# Patient Record
Sex: Female | Born: 1945 | Race: White | Hispanic: No | Marital: Married | Smoking: Former smoker
Health system: Southern US, Community
[De-identification: ages and names within clinical notes are randomized; demographics above are authoritative.]

## PROBLEM LIST (undated history)

## (undated) DIAGNOSIS — I10 Essential (primary) hypertension: Secondary | ICD-10-CM

## (undated) DIAGNOSIS — E119 Type 2 diabetes mellitus without complications: Secondary | ICD-10-CM

## (undated) DIAGNOSIS — G609 Hereditary and idiopathic neuropathy, unspecified: Secondary | ICD-10-CM

## (undated) DIAGNOSIS — F329 Major depressive disorder, single episode, unspecified: Secondary | ICD-10-CM

## (undated) DIAGNOSIS — M549 Dorsalgia, unspecified: Secondary | ICD-10-CM

## (undated) DIAGNOSIS — M899 Disorder of bone, unspecified: Secondary | ICD-10-CM

## (undated) DIAGNOSIS — F3289 Other specified depressive episodes: Secondary | ICD-10-CM

## (undated) DIAGNOSIS — Z9049 Acquired absence of other specified parts of digestive tract: Secondary | ICD-10-CM

## (undated) DIAGNOSIS — Z87891 Personal history of nicotine dependence: Secondary | ICD-10-CM

## (undated) DIAGNOSIS — E669 Obesity, unspecified: Secondary | ICD-10-CM

## (undated) DIAGNOSIS — M949 Disorder of cartilage, unspecified: Secondary | ICD-10-CM

## (undated) DIAGNOSIS — IMO0001 Reserved for inherently not codable concepts without codable children: Secondary | ICD-10-CM

## (undated) DIAGNOSIS — Z905 Acquired absence of kidney: Secondary | ICD-10-CM

## (undated) DIAGNOSIS — Z8489 Family history of other specified conditions: Secondary | ICD-10-CM

## (undated) DIAGNOSIS — N19 Unspecified kidney failure: Secondary | ICD-10-CM

## (undated) DIAGNOSIS — IMO0002 Reserved for concepts with insufficient information to code with codable children: Secondary | ICD-10-CM

## (undated) DIAGNOSIS — M722 Plantar fascial fibromatosis: Secondary | ICD-10-CM

## (undated) DIAGNOSIS — J449 Chronic obstructive pulmonary disease, unspecified: Secondary | ICD-10-CM

## (undated) DIAGNOSIS — Z9889 Other specified postprocedural states: Secondary | ICD-10-CM

## (undated) HISTORY — PX: CARPAL TUNNEL RELEASE: SHX101

## (undated) HISTORY — PX: FOOT SURGERY: SHX648

## (undated) HISTORY — DX: Major depressive disorder, single episode, unspecified: F32.9

## (undated) HISTORY — DX: Disorder of bone, unspecified: M89.9

## (undated) HISTORY — PX: ABDOMINAL HYSTERECTOMY: SHX81

## (undated) HISTORY — PX: NECK SURGERY: SHX720

## (undated) HISTORY — DX: Obesity, unspecified: E66.9

## (undated) HISTORY — PX: LUMBAR FUSION: SHX111

## (undated) HISTORY — PX: HIP FRACTURE SURGERY: SHX118

## (undated) HISTORY — PX: APPENDECTOMY: SHX54

## (undated) HISTORY — DX: Reserved for inherently not codable concepts without codable children: IMO0001

## (undated) HISTORY — DX: Other specified depressive episodes: F32.89

## (undated) HISTORY — DX: Unspecified kidney failure: N19

## (undated) HISTORY — DX: Essential (primary) hypertension: I10

## (undated) HISTORY — PX: VESICOVAGINAL FISTULA CLOSURE W/ TAH: SUR271

## (undated) HISTORY — DX: Acquired absence of other specified parts of digestive tract: Z90.49

## (undated) HISTORY — PX: BACK SURGERY: SHX140

## (undated) HISTORY — DX: Dorsalgia, unspecified: M54.9

## (undated) HISTORY — DX: Plantar fascial fibromatosis: M72.2

## (undated) HISTORY — DX: Acquired absence of kidney: Z90.5

## (undated) HISTORY — PX: CARDIAC CATHETERIZATION: SHX172

## (undated) HISTORY — DX: Other specified postprocedural states: Z98.890

## (undated) HISTORY — DX: Hereditary and idiopathic neuropathy, unspecified: G60.9

## (undated) HISTORY — PX: CHOLECYSTECTOMY: SHX55

## (undated) HISTORY — DX: Personal history of nicotine dependence: Z87.891

## (undated) HISTORY — DX: Type 2 diabetes mellitus without complications: E11.9

## (undated) HISTORY — PX: OTHER SURGICAL HISTORY: SHX169

## (undated) HISTORY — DX: Disorder of cartilage, unspecified: M94.9

## (undated) HISTORY — PX: CERVICAL DISCECTOMY: SHX98

## (undated) HISTORY — DX: Reserved for concepts with insufficient information to code with codable children: IMO0002

## (undated) HISTORY — PX: JOINT REPLACEMENT: SHX530

## (undated) HISTORY — PX: ELBOW SURGERY: SHX618

## (undated) SURGERY — VIDEO BRONCHOSCOPY WITHOUT FLUORO
Anesthesia: Moderate Sedation

---

## 1989-11-15 HISTORY — PX: KIDNEY DONATION: SHX685

## 1999-09-16 HISTORY — PX: COLONOSCOPY: SHX174

## 2002-04-24 ENCOUNTER — Ambulatory Visit (HOSPITAL_COMMUNITY): Admission: RE | Admit: 2002-04-24 | Discharge: 2002-04-24 | Payer: Self-pay | Admitting: Cardiology

## 2003-01-16 ENCOUNTER — Encounter: Payer: Self-pay | Admitting: Neurosurgery

## 2003-01-18 ENCOUNTER — Encounter: Payer: Self-pay | Admitting: Neurosurgery

## 2003-01-18 ENCOUNTER — Inpatient Hospital Stay (HOSPITAL_COMMUNITY): Admission: RE | Admit: 2003-01-18 | Discharge: 2003-01-22 | Payer: Self-pay | Admitting: Neurosurgery

## 2003-07-31 ENCOUNTER — Encounter: Payer: Self-pay | Admitting: Family Medicine

## 2003-07-31 ENCOUNTER — Encounter: Admission: RE | Admit: 2003-07-31 | Discharge: 2003-07-31 | Payer: Self-pay | Admitting: Family Medicine

## 2003-09-13 ENCOUNTER — Other Ambulatory Visit: Admission: RE | Admit: 2003-09-13 | Discharge: 2003-09-13 | Payer: Self-pay | Admitting: Family Medicine

## 2004-09-15 ENCOUNTER — Ambulatory Visit: Payer: Self-pay | Admitting: Licensed Clinical Social Worker

## 2004-09-30 ENCOUNTER — Ambulatory Visit: Payer: Self-pay | Admitting: Licensed Clinical Social Worker

## 2004-09-30 ENCOUNTER — Ambulatory Visit: Payer: Self-pay | Admitting: Family Medicine

## 2004-10-05 ENCOUNTER — Ambulatory Visit: Payer: Self-pay | Admitting: Family Medicine

## 2004-10-15 HISTORY — PX: REPLACEMENT TOTAL KNEE: SUR1224

## 2004-10-20 ENCOUNTER — Ambulatory Visit: Payer: Self-pay | Admitting: Family Medicine

## 2004-10-27 ENCOUNTER — Ambulatory Visit: Payer: Self-pay | Admitting: Family Medicine

## 2004-11-10 ENCOUNTER — Inpatient Hospital Stay (HOSPITAL_COMMUNITY): Admission: RE | Admit: 2004-11-10 | Discharge: 2004-11-13 | Payer: Self-pay | Admitting: Orthopedic Surgery

## 2004-12-02 ENCOUNTER — Ambulatory Visit: Payer: Self-pay | Admitting: Family Medicine

## 2004-12-17 ENCOUNTER — Encounter: Admission: RE | Admit: 2004-12-17 | Discharge: 2004-12-17 | Payer: Self-pay | Admitting: Neurosurgery

## 2004-12-25 ENCOUNTER — Encounter: Admission: RE | Admit: 2004-12-25 | Discharge: 2004-12-25 | Payer: Self-pay | Admitting: Neurosurgery

## 2005-01-29 ENCOUNTER — Ambulatory Visit: Payer: Self-pay | Admitting: Family Medicine

## 2005-03-04 ENCOUNTER — Ambulatory Visit: Payer: Self-pay | Admitting: Family Medicine

## 2005-05-05 ENCOUNTER — Ambulatory Visit: Payer: Self-pay | Admitting: Family Medicine

## 2005-05-13 ENCOUNTER — Encounter: Admission: RE | Admit: 2005-05-13 | Discharge: 2005-05-13 | Payer: Self-pay | Admitting: Neurosurgery

## 2005-07-13 ENCOUNTER — Inpatient Hospital Stay (HOSPITAL_COMMUNITY): Admission: RE | Admit: 2005-07-13 | Discharge: 2005-07-15 | Payer: Self-pay | Admitting: Neurosurgery

## 2005-11-02 ENCOUNTER — Ambulatory Visit: Payer: Self-pay | Admitting: Family Medicine

## 2005-11-23 ENCOUNTER — Ambulatory Visit: Payer: Self-pay | Admitting: Family Medicine

## 2005-11-25 ENCOUNTER — Ambulatory Visit: Payer: Self-pay | Admitting: Family Medicine

## 2005-12-09 ENCOUNTER — Ambulatory Visit: Payer: Self-pay | Admitting: Family Medicine

## 2005-12-27 ENCOUNTER — Encounter: Admission: RE | Admit: 2005-12-27 | Discharge: 2005-12-27 | Payer: Self-pay | Admitting: Orthopedic Surgery

## 2006-02-07 ENCOUNTER — Ambulatory Visit: Payer: Self-pay | Admitting: Family Medicine

## 2006-02-24 ENCOUNTER — Observation Stay (HOSPITAL_COMMUNITY): Admission: AD | Admit: 2006-02-24 | Discharge: 2006-02-25 | Payer: Self-pay | Admitting: Internal Medicine

## 2006-02-24 ENCOUNTER — Ambulatory Visit: Payer: Self-pay | Admitting: Internal Medicine

## 2006-07-13 ENCOUNTER — Ambulatory Visit: Payer: Self-pay | Admitting: Family Medicine

## 2006-08-02 ENCOUNTER — Ambulatory Visit: Payer: Self-pay | Admitting: Pulmonary Disease

## 2006-08-04 ENCOUNTER — Inpatient Hospital Stay (HOSPITAL_COMMUNITY): Admission: RE | Admit: 2006-08-04 | Discharge: 2006-08-07 | Payer: Self-pay | Admitting: Orthopedic Surgery

## 2006-11-02 ENCOUNTER — Ambulatory Visit: Payer: Self-pay | Admitting: Unknown Physician Specialty

## 2006-12-22 ENCOUNTER — Ambulatory Visit: Payer: Self-pay | Admitting: Infectious Diseases

## 2007-03-09 ENCOUNTER — Encounter: Payer: Self-pay | Admitting: Family Medicine

## 2007-03-09 DIAGNOSIS — E118 Type 2 diabetes mellitus with unspecified complications: Secondary | ICD-10-CM

## 2007-03-09 DIAGNOSIS — E119 Type 2 diabetes mellitus without complications: Secondary | ICD-10-CM | POA: Insufficient documentation

## 2007-03-09 DIAGNOSIS — M549 Dorsalgia, unspecified: Secondary | ICD-10-CM | POA: Insufficient documentation

## 2007-03-09 DIAGNOSIS — IMO0002 Reserved for concepts with insufficient information to code with codable children: Secondary | ICD-10-CM | POA: Insufficient documentation

## 2007-03-09 DIAGNOSIS — I10 Essential (primary) hypertension: Secondary | ICD-10-CM

## 2007-03-09 DIAGNOSIS — IMO0001 Reserved for inherently not codable concepts without codable children: Secondary | ICD-10-CM | POA: Insufficient documentation

## 2007-03-09 DIAGNOSIS — E1165 Type 2 diabetes mellitus with hyperglycemia: Secondary | ICD-10-CM

## 2007-03-09 DIAGNOSIS — F329 Major depressive disorder, single episode, unspecified: Secondary | ICD-10-CM | POA: Insufficient documentation

## 2007-03-09 DIAGNOSIS — G609 Hereditary and idiopathic neuropathy, unspecified: Secondary | ICD-10-CM | POA: Insufficient documentation

## 2007-03-09 DIAGNOSIS — Z87891 Personal history of nicotine dependence: Secondary | ICD-10-CM

## 2008-01-10 ENCOUNTER — Ambulatory Visit: Payer: Self-pay | Admitting: Internal Medicine

## 2008-04-16 ENCOUNTER — Ambulatory Visit: Payer: Self-pay | Admitting: General Surgery

## 2008-04-16 ENCOUNTER — Other Ambulatory Visit: Payer: Self-pay

## 2008-04-16 ENCOUNTER — Ambulatory Visit: Payer: Self-pay | Admitting: Cardiology

## 2008-04-19 ENCOUNTER — Ambulatory Visit: Payer: Self-pay | Admitting: General Surgery

## 2009-01-14 ENCOUNTER — Ambulatory Visit: Payer: Medicare Other | Admitting: Internal Medicine

## 2009-03-26 ENCOUNTER — Emergency Department (HOSPITAL_COMMUNITY): Admission: EM | Admit: 2009-03-26 | Discharge: 2009-03-26 | Payer: Self-pay | Admitting: Family Medicine

## 2009-03-28 ENCOUNTER — Encounter: Admission: RE | Admit: 2009-03-28 | Discharge: 2009-03-28 | Payer: Self-pay | Admitting: Orthopedic Surgery

## 2009-07-26 DIAGNOSIS — I2510 Atherosclerotic heart disease of native coronary artery without angina pectoris: Secondary | ICD-10-CM | POA: Insufficient documentation

## 2009-08-05 ENCOUNTER — Encounter: Payer: Self-pay | Admitting: Cardiology

## 2009-08-06 ENCOUNTER — Encounter: Payer: Self-pay | Admitting: Cardiology

## 2009-08-06 ENCOUNTER — Ambulatory Visit: Payer: Medicare Other | Admitting: Internal Medicine

## 2009-08-12 ENCOUNTER — Encounter: Payer: Self-pay | Admitting: Cardiology

## 2009-08-14 ENCOUNTER — Ambulatory Visit: Payer: Medicare Other | Admitting: Family

## 2009-08-14 ENCOUNTER — Encounter: Payer: Self-pay | Admitting: Cardiology

## 2009-08-25 ENCOUNTER — Ambulatory Visit: Payer: Self-pay | Admitting: Cardiology

## 2009-09-01 ENCOUNTER — Telehealth: Payer: Self-pay | Admitting: Cardiology

## 2009-09-02 ENCOUNTER — Telehealth (INDEPENDENT_AMBULATORY_CARE_PROVIDER_SITE_OTHER): Payer: Self-pay | Admitting: *Deleted

## 2009-09-22 ENCOUNTER — Telehealth (INDEPENDENT_AMBULATORY_CARE_PROVIDER_SITE_OTHER): Payer: Self-pay | Admitting: *Deleted

## 2009-09-23 ENCOUNTER — Encounter (HOSPITAL_COMMUNITY): Admission: RE | Admit: 2009-09-23 | Discharge: 2009-11-13 | Payer: Self-pay | Admitting: Cardiology

## 2009-09-23 ENCOUNTER — Ambulatory Visit: Payer: Self-pay | Admitting: Cardiology

## 2009-09-23 ENCOUNTER — Ambulatory Visit: Payer: Self-pay

## 2009-09-26 ENCOUNTER — Encounter: Payer: Self-pay | Admitting: Cardiology

## 2009-09-29 ENCOUNTER — Ambulatory Visit: Payer: Self-pay | Admitting: Cardiology

## 2009-09-30 ENCOUNTER — Telehealth: Payer: Self-pay | Admitting: Cardiology

## 2009-09-30 ENCOUNTER — Encounter: Payer: Self-pay | Admitting: Cardiology

## 2009-10-07 ENCOUNTER — Encounter: Payer: Self-pay | Admitting: Cardiology

## 2009-10-08 ENCOUNTER — Telehealth: Payer: Self-pay | Admitting: Cardiology

## 2009-10-14 ENCOUNTER — Telehealth: Payer: Self-pay | Admitting: Cardiology

## 2009-10-14 ENCOUNTER — Encounter: Payer: Self-pay | Admitting: Cardiology

## 2009-12-19 ENCOUNTER — Encounter: Payer: Self-pay | Admitting: Internal Medicine

## 2009-12-19 ENCOUNTER — Ambulatory Visit: Payer: Self-pay | Admitting: Cardiology

## 2010-01-15 ENCOUNTER — Ambulatory Visit: Payer: Medicare Other | Admitting: Internal Medicine

## 2010-10-05 ENCOUNTER — Ambulatory Visit: Payer: Self-pay | Admitting: Cardiology

## 2010-12-13 LAB — CONVERTED CEMR LAB
BUN: 17 mg/dL
CO2: 22 meq/L
Chloride: 97 meq/L
Creatinine, Ser: 0.98 mg/dL
GFR calc non Af Amer: 57 mL/min
Potassium: 4.6 meq/L
Sodium: 135 meq/L

## 2010-12-15 NOTE — Assessment & Plan Note (Signed)
Summary: 4-6 WK F/U   Visit Type:  4-6 weeks follow up Primary Provider:  Fara Olden, MD and Dr Lajoyce Lauber  CC:  No complains.  History of Present Illness: The patient is 65 years old and return for followup after her evaluation for chest pain and management of hypertension. She developed chest pain and we evaluated her with a Myoview scan which was negative for ischemia. Her chest pain was somewhat atypical and it has resolved.  Her other major problem was hypertension which was not well controlled at the time I saw her. We increased the Diovan but she didn't tolerate this and we cut back on the Diovan and consider beta blocker but she wanted to try other measures. Her blood pressure is been under control since that time.  Her risk profile for vascular disease includes diabetes hypertension positive cigarettes and a positive family history for coronary heart disease.  Her past history is significant for hernia kidney to her brother.  Current Medications (verified): 1)  Diovan Hct 320-25 Mg Tabs (Valsartan-Hydrochlorothiazide) .... One Tab By Mouth Once Daily 2)  Calcium Carbonate 600 Mg Tabs (Calcium Carbonate) .Marland Kitchen.. 1 By Mouth Bid 3)  Nexium 40 Mg Cpdr (Esomeprazole Magnesium) .Marland Kitchen.. 1 By Mouth Qd 4)  Aspirin 325 Mg Tabs (Aspirin) .Marland Kitchen.. 1 By Mouth Qd 5)  Daily Multiple Vitamins  Tabs (Multiple Vitamin) .Marland Kitchen.. 1 By Mouth Qd 6)  Humalog  Soln (Insulin Lispro (Human) Soln) .Marland Kitchen.. 16 U Q Am, 19u Q Pm (11.15.2010) 7)  Tylenol Arthritis Pain 650 Mg Tbcr (Acetaminophen) .... 2 By Mouth Once Daily Prn 8)  Metformin Hcl 1000 Mg Tabs (Metformin Hcl) .Marland Kitchen.. 1 By Mouth  Two Times A Day 9)  Veramyst 27.5 Mcg/spray Susp (Fluticasone Furoate) .... As Directed 10)  Proventil Hfa 108 (90 Base) Mcg/act Aers (Albuterol Sulfate) .Marland Kitchen.. 1-2 Puffs Every 4-6 Hours 11)  Advair Diskus 250-50 Mcg/dose Aepb (Fluticasone-Salmeterol) .... As Directed 12)  Humlin N .... Sliding Scale  Allergies: 1)  ! Penicillin 2)   ! Morphine 3)  ! * Anti Inflammatories 4)  ! Neurontin  Past History:  Past Medical History: Reviewed history from 08/25/2009 and no changes required. 1. FIBROMYALGIA (ICD-729.1) 2. Hx of PLANTAR FASCIITIS (ICD-728.71) 3. TOBACCO ABUSE, HX OF (ICD-V15.82): smokes 1/2 ppd.  4. DEGENERATIVE DISC DISEASE (ICD-722.6) 5.  BACK PAIN, CHRONIC (ICD-724.5): status post c-spine and l-spine fusions 6. OBESITY (ICD-278.00) 7. OSTEOPENIA (ICD-733.90) 8. 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  9. HYPERTENSION (ICD-401.9) 10. DIABETES MELLITUS, TYPE II (ICD-250.00) 11. DEPRESSION (ICD-311) 12. Status post cholecystectomy 13. GERD 14. 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 Left heart cath 7 years ago: no significant CAD  Review of Systems       ROS is negative except as outlined in HPI.   Vital Signs:  Patient profile:   65 year old female Height:      62 inches Weight:      166.50 pounds BMI:     30.56 Pulse rate:   88 / minute Pulse rhythm:   regular Resp:     18 per minute BP sitting:   134 / 74  (left arm) Cuff size:   large  Vitals Entered By: Sidney Ace (December 19, 2009 3:59 PM)  Physical Exam  Additional Exam:  Gen. Well-nourished, in no distress   Neck: No JVD, thyroid not enlarged, no carotid bruits Lungs: No tachypnea, clear without rales, rhonchi or wheezes Cardiovascular: Rhythm regular, PMI not displaced,  heart sounds  normal, no murmurs or gallops, no peripheral edema, pulses normal in all 4 extremities. Abdomen: BS normal, abdomen soft and non-tender without masses or organomegaly, no hepatosplenomegaly. MS: No deformities, no cyanosis or clubbing   Neuro:  No focal sns   Skin:  no lesions    Impression & Recommendations:  Problem # 1:  CHEST PAIN UNSPECIFIED (ICD-786.50) Her chest pain was somewhat atypical for ischemia and she has a negative Myoview scan. Her symptoms have resolved. I think there is no evidence for ischemic heart  disease although she does have a high-risk profile. We have previously counseled her on reducing her cigarette use and her blood pressure is now under good control. Her updated medication list for this problem includes:    Aspirin 325 Mg Tabs (Aspirin) .Marland Kitchen... 1 by mouth qd  Problem # 2:  HYPERTENSION (ICD-401.9) Her blood pressure is now controlled on her current dosage of Diovan. We will continue current therapy. Her updated medication list for this problem includes:    Diovan Hct 320-25 Mg Tabs (Valsartan-hydrochlorothiazide) ..... One tab by mouth once daily    Aspirin 325 Mg Tabs (Aspirin) .Marland Kitchen... 1 by mouth qd  Problem # 3:  TOBACCO ABUSE, HX OF (ICD-V15.82) With her positive family history for coronary heart disease and with her hypertension is it is especially important if she were to discontinue her cigarettes  Other Orders: EKG w/ Interpretation (93000)  Patient Instructions: 1)  We will see you back on an as needed basis.

## 2010-12-15 NOTE — Assessment & Plan Note (Signed)
Summary: rov   Visit Type:  Follow-up Primary Provider:  Fara Olden, MD and Dr Lajoyce Lauber   History of Present Illness:  Andrea Stewart is 65 years old and came in today for preventative evaluation. I had seen her in the past for evaluation of chest pain and she had a negative Myoview. She has multiple risk factors for coronary disease including hypertension and diabetes and a positive family history for coronary heart disease. She has had no recent chest pain short of breath or palpitations.  She does still smoke.  She has had a nephrectomy to donate a kidney to her brother.  Current Medications (verified): 1)  Diovan Hct 320-25 Mg Tabs (Valsartan-Hydrochlorothiazide) .... One Tab By Mouth Once Daily 2)  Calcium Carbonate 600 Mg Tabs (Calcium Carbonate) .Marland Kitchen.. 1 By Mouth Bid 3)  Prilosec 40 Mg Cpdr (Omeprazole) .... Once Daily 4)  Aspirin 325 Mg Tabs (Aspirin) .Marland Kitchen.. 1 By Mouth Qd 5)  Daily Multiple Vitamins  Tabs (Multiple Vitamin) .Marland Kitchen.. 1 By Mouth Qd 6)  Humalog  Soln (Insulin Lispro (Human) Soln) .... Uad 7)  Tylenol Arthritis Pain 650 Mg Tbcr (Acetaminophen) .... 2 By Mouth Once Daily Prn 8)  Metformin Hcl 1000 Mg Tabs (Metformin Hcl) .Marland Kitchen.. 1 By Mouth  Two Times A Day 9)  Veramyst 27.5 Mcg/spray Susp (Fluticasone Furoate) .... As Directed 10)  Proventil Hfa 108 (90 Base) Mcg/act Aers (Albuterol Sulfate) .Marland Kitchen.. 1-2 Puffs Every 4-6 Hours 11)  Advair Diskus 250-50 Mcg/dose Aepb (Fluticasone-Salmeterol) .... As Directed 12)  Humlin N .... Sliding Scale  Allergies: 1)  ! Penicillin 2)  ! Morphine 3)  ! * Anti Inflammatories 4)  ! Neurontin  Past History:  Past Medical History: Reviewed history from 08/25/2009 and no changes required. 1. FIBROMYALGIA (ICD-729.1) 2. Hx of PLANTAR FASCIITIS (ICD-728.71) 3. TOBACCO ABUSE, HX OF (ICD-V15.82): smokes 1/2 ppd.  4. DEGENERATIVE DISC DISEASE (ICD-722.6) 5.  BACK PAIN, CHRONIC (ICD-724.5): status post c-spine and l-spine fusions 6.  OBESITY (ICD-278.00) 7. OSTEOPENIA (ICD-733.90) 8. 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  9. HYPERTENSION (ICD-401.9) 10. DIABETES MELLITUS, TYPE II (ICD-250.00) 11. DEPRESSION (ICD-311) 12. Status post cholecystectomy 13. GERD 14. s/p nephrectomy (donated to brother) 15. Left heart cath 7 years ago: no significant CAD  Review of Systems       ROS is negative except as outlined in HPI.   Vital Signs:  Patient profile:   65 year old female Height:      62 inches Weight:      180 pounds BMI:     33.04 Pulse rate:   80 / minute BP sitting:   140 / 70  (left arm)  Vitals Entered By: Margaretmary Bayley CMA (October 05, 2010 4:28 PM)  Physical Exam  Additional Exam:  Gen. Well-nourished, in no distress   Neck: No JVD, thyroid not enlarged, no carotid bruits Lungs: No tachypnea, clear without rales, rhonchi or wheezes Cardiovascular: Rhythm regular, PMI not displaced,  heart sounds  normal, no murmurs or gallops, no peripheral edema, pulses normal in all 4 extremities. Abdomen: BS normal, abdomen soft and non-tender without masses or organomegaly, no hepatosplenomegaly. MS: No deformities, no cyanosis or clubbing   Neuro:  No focal sns   Skin:  no lesions    Impression & Recommendations:  Problem # 1:  HYPERTENSION (ICD-401.9) This appears fairly well controlled on her current medications. Costs are  somewhat of an issue with the Diovan and we might consider an alternative generic ARB. I'll defer  this to Dr. Derrel Nip. Her updated medication list for this problem includes:    Diovan Hct 320-25 Mg Tabs (Valsartan-hydrochlorothiazide) ..... One tab by mouth once daily    Aspirin 325 Mg Tabs (Aspirin) .Marland Kitchen... 1 by mouth qd  Problem # 2:  TOBACCO ABUSE, HX OF (ICD-V15.82) I reinforced the importance of her decreasing and discontinuing tobacco use.  Problem # 3:  DIABETES MELLITUS, TYPE II (ICD-250.00) This is managed by her primary care physician.  She indicated that  her recent cholesterol panel was good. With her diabetes we might consider a statin as secondary prevention especially if her LDL is not well below 100. Her updated medication list for this problem includes:    Diovan Hct 320-25 Mg Tabs (Valsartan-hydrochlorothiazide) ..... One tab by mouth once daily    Aspirin 325 Mg Tabs (Aspirin) .Marland Kitchen... 1 by mouth qd    Humalog Soln (Insulin lispro (human) soln) ..... Uad    Metformin Hcl 1000 Mg Tabs (Metformin hcl) .Marland Kitchen... 1 by mouth  two times a day  Other Orders: EKG w/ Interpretation (93000)  Patient Instructions: 1)  We will see you back on a as needed basis. 2)  Your physician recommends that you continue on your current medications as directed. Please refer to the Current Medication list given to you today.

## 2011-01-19 ENCOUNTER — Ambulatory Visit: Payer: Medicare Other | Admitting: Internal Medicine

## 2011-01-20 ENCOUNTER — Ambulatory Visit: Payer: Medicare Other | Admitting: Internal Medicine

## 2011-04-02 NOTE — H&P (Signed)
NAMEMarland Kitchen  MYERS, HAWKS NO.:  [id number]   MEDICAL RECORD NO.:  [id number]                   PATIENT TYPE:  INP   LOCATION:  3172                                 FACILITY:  [facility]   PHYSICIAN:  Elizabeth Sauer, M.D.                   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   HISTORY OF PRESENT ILLNESS:  This is a 65 year old right-handed white girl  whom I initially saw for neck pain last May.  She had some resolution of  that, and by the time she got to me had increasing back pain.  MRI had been  obtained that demonstrated severe spondylotic changes at L4-5 and L5-S1.  She has been through Neurontin and numerous pain maneuvers to no avail and  now presents for lumbar fusion.   PAST MEDICAL HISTORY:  Remarkable for diabetes.  She is on Silex 120 mg a  day and Actos 45 mg a day.  She has hypertension. She is on Diovan 160/12.5  mg a day, Premarin 0.125 mg a day, Nexium 20 mg a day, BuSpar 15 mg twice a  day, bupropion 75 mg twice a day.  Trental and Lantus insulin 35 units.   ALLERGIES:  PENICILLIN, MORPHINE,  and NONSTEROIDAL ANTI-INFLAMMATORIES.   PAST SURGICAL HISTORY:  1. Lumbar diskectomy in 1991.  2. Nephrectomy in 1991.  3. Appendectomy in 1958.  4. Cholecystectomy 1953.  5. Pilonidal cysts in 2000.  6. Two foot operations.  7. Carpal tunnel and trigger finger.   SOCIAL HISTORY:  She does not smoke or drink, is not working.   FAMILY HISTORY:  Mom is 55, diabetes and kidney failure.  Dad is deceased.  History is not given.   REVIEW OF SYSTEMS:  Remarkable for glasses, hypertension,  hypercholesterolemia, arm weakness, back pain, arm pain, leg pain, joint  pain, neck pain, anxiety, depression, diabetes, food allergies, and inhalant  allergies.   PHYSICAL EXAMINATION:  HEENT  Within normal limits.  NECK:  Reasonable range of motion of her neck, although she says she does  have catches in it.  CARDIAC:  Regular rate and rhythm.  CHEST: Clear.  ABDOMEN:  Nontender.  No hepatosplenomegaly.  EXTREMITIES:  Without clubbing or cyanosis.  GU:  Exam deferred.  PULSES:  Peripheral pulses are good.  NEUROLOGIC:  Awake, alert, and oriented.  Cranial nerves are intact.  Motor  exam has full strength in the upper extremities.  Lower extremity strength  is full until you get her up and ask her to walk.  Then she has weakness of  the dorsiflexors at 4/5 after about 5 minutes.  Reflexes 1 throughout the  upper extremities.  The lower extremities are areflexic.  She does have a  sensory deficit which  is stocking glove fashion strongly suggestive of some  component of peripheral neuropathy.   LABORATORY DATA:  The MRI of her lumbar spine demonstrated bone-on-bone at  L4-5 and L5-S1 with severe facet arthropathy.   PLAN:  Lumbar laminectomy, diskectomy, posterior lumbar interbody fusion at  L4-5 and L5-S1. The risks and benefits of this approach have been discussed  with her, and she wishes to proceed.                                               Elizabeth Sauer, M.D.    MWR/MEDQ  D:  01/18/2003  T:  01/18/2003  Job:  [id number]

## 2011-04-02 NOTE — Discharge Summary (Signed)
   NAMEASCHLEY, SALAY NO.:  [id number]   MEDICAL RECORD NO.:  [id number]                   PATIENT TYPE:  INP   LOCATION:  3009                                 FACILITY:  [facility]   PHYSICIAN:  Elizabeth Sauer, M.D.                   DATE OF BIRTH:  Jun 01, 1946   DATE OF ADMISSION:  01/18/2003  DATE OF DISCHARGE:  01/22/2003                                 DISCHARGE SUMMARY   ADMITTING DIAGNOSIS:  Spondylosis L4-5, L5-S1.   DISCHARGE DIAGNOSES:  Spondylosis L4-5. L5-S1.   OPERATIVE PROCEDURE:  L4-5, L5-S1 laminectomy, diskectomy, posterior lumbar  interbody fusion with a Ray threaded fusion cage, and posterolateral bone  grafting.   SERVICE:  Neurosurgery.   COMPLICATIONS:  None.   DISCHARGE STATUS:  Responding well.   HISTORY:  A 65 year old right-handed white lady whose history and physical  is recanted in the chart.  She has had back pain and had severe spondylitic  disease at 4-5-1, failed epidural steroids, and admitted for fusion.  Her  medical history is remarkable for diabetes.   MEDICATIONS:  She is on Cylex, Actos, Diovan, Premarin, Nexium, BuSpar,  bupropion, Trental, and Lantus insulin 35 units twice a day.   PHYSICAL EXAMINATION:  General exam is intact.  Neurologic exam was intact.   HOSPITAL COURSE:  She was admitted after ascertaining the normal laboratory  values and underwent a lumbar fusion as noted above.  Postoperatively she  did reasonably well.  Foley was removed postoperative day #1.  Postoperative  day #2 the PCA was stopped and she was up walking, eating, and voiding  normally.  She participated in physical therapy and was able to ambulate.  She was discharged home on January 22, 2003 with a dry intact incision, full  strength, eating and voiding normally.   FOLLOW-UP:  Her follow-up will be in the [place]  office in a week for suture removal.                                               Elizabeth Sauer, M.D.    MWR/MEDQ  D:  03/20/2003  T:  03/21/2003  Job:  [id number]

## 2011-04-02 NOTE — Op Note (Signed)
NAMEJOAQUIN, MASSMANN NO.:  [id number]   MEDICAL RECORD NO.:  [id number]          PATIENT TYPE:  INP   LOCATION:  [place]                         FACILITY:  [facility]   PHYSICIAN:  Pietro Cassis. Alvan Dame, M.D.  DATE OF BIRTH:  03-Sep-1946   DATE OF PROCEDURE:  08/04/2006  DATE OF DISCHARGE:                                 OPERATIVE REPORT   PREOPERATIVE DIAGNOSIS:  Right knee end-stage osteoarthritis.   POSTOPERATIVE DIAGNOSIS:  Right knee end-stage osteoarthritis.   PROCEDURE:  Right total knee replacement.   COMPONENTS USED:  DePuy rotating platform, posterior-stabilized knee system  with a size 2.5 femur, 2.5 tibia, 10-mm posterior-stabilized insert and a 38  patellar button.   SURGEON:  Pietro Cassis. Alvan Dame, M.D.   ASSISTANTS:  1. Bobbye Morton , P.A.-C  2. Rowan Blase, P.A.-C   ANESTHESIA:  Regional.   BLOOD LOSS:  50 mL.   TOURNIQUET TIME:  Sixty minutes at 250 mmHg.   DRAINS:  One.   INDICATIONS:  Ms. Trachtman is a pleasant 65 year old female with history of  arthritis.  She had a history of left total knee replacement.  The right  knee has had significant degenerative changes as well with radiographic  evidence of progression to bone-on-bone arthritis.  After failing all  concern measures and an intolerance to a lot of conservative measures, she  wished to proceed with surgical intervention.  She has successfully  recovered from her left knee with a lot therapy.   After reviewing with her the risks and benefits that she had understood,  consent was obtained.   PROCEDURE IN DETAIL:  The patient was brought to the operative theater.  Once adequate anesthesia and preoperative antibiotic, 1 g of vancomycin, was  given due to penicillin allergy, the patient was positioned supine with a  proximal thigh tourniquet placed.  The right lower extremity was then  prepped and draped in a sterile fashion.  The leg was exsanguinated and  tourniquet elevated.  A midline  incision was made followed by a modified  medial patellar arthrotomy.  The patella was subluxated, not everted.  Debridement of the osteophytes was carried out to allow for further  exposure.   Once the knee exposure was obtained, the femoral canal was entered with a  drill.  I irrigated the canal and suctioned it out to prevent fat emboli.  I  then placed an intramedullary rod and at 5 degrees of valgus, resected 11 mm  off the distal femur due to flexion contracture of greater than degrees.   I then sized the femur to match the contralateral side, it being a size 2.5.  Based on the posterior condylar reference system, this line was  perpendicular to [facility] 's line.   Then the anterior, posterior and chamfer cuts were made with a 2.5 cutting  jig.  There was no notching of the femur.   I went ahead and fixed the final trochlear box cut with a 2.5 trochlear box  guide.   Following this, attention was directed to the tibial.  Tibial preparation  was carried out with patella subluxation and  retraction and protection  against injury to ligaments.  I resected 10 mm of bone measured off the  lateral proximal tibia and the central portion.   Following this cut and debridement, I sized and determined that the 10-mm  spacer was placed with the knee coming out to full extension.   At this point, final tibial preparation was carried out, placing the tibial  tray onto the tibia and holding it place with pins, the alignment rod was  passed, noting that the rod passed through the central portion of the ankle  in AP and lateral planes.  Final preparation with drilling and keel punch  was carried out.  The tibial component was left in place for trial  reduction.  A size 2.5 femur was then placed onto the femur and the size 10  insert placed.  The knee came to full extension, with stable from extension  to flexion with ligament balancing.   Note that a proximal and medial peel was carried out  initially due to  flexion varus deformity of the knee.   At this point, the trial components were left in place.  Attention was  directed the patella.  Precut measurement was 24 mm and I resected down to  15 mm and replaced it with a 38 patellar button.  The patella tracked  without application of pressure with trial reduction.  At this point, the  final components were brought to the field.  The trial components were  removed, the knee was irrigated with pulse lavage knee system and further  debridement was carried out as necessary.  I injected the knee with 0.25%  Marcaine with epinephrine and Toradol.   Following this, the cement was mixed.  When the cement was ready, the tibial  component was cemented in first followed by the femur with a 10-mm spacer  placed.  The patella was cemented in last.  Once this cement had cured,  excessive cement was debrided; the knee was irrigated.  I then placed the  final 10-mm insert and the knee was reduced.  Again, the knee was irrigated.  I reapproximated the extensor mechanism over a closed Hemovac drain with the  knee in flexion using a #1 Vicryl.  The remainder of the wound was closed in  layers with 2-0 Vicryl and a running 4-0 Monocryl.  The patient was then  brought to the recovery room in stable condition.  She tolerated the  procedure without apparent complication.      Pietro Cassis Alvan Dame, M.D.  Electronically Signed     MDO/MEDQ  D:  08/04/2006  T:  08/06/2006  Job:  [id number]

## 2011-04-02 NOTE — H&P (Signed)
NAME:  Andrea Stewart, Andrea Stewart NO.:  [id number]   MEDICAL RECORD NO.:  [id number]          PATIENT TYPE:  INP   LOCATION:  NA                           FACILITY:  [facility]   PHYSICIAN:  Elizabeth Sauer, M.D.      DATE OF BIRTH:  1946/01/15   DATE OF ADMISSION:  07/13/2005  DATE OF DISCHARGE:                                HISTORY & PHYSICAL   ADMITTING DIAGNOSIS:  Cervical spondylosis, C3-4, C4-5, and C5-6.   HISTORY OF PRESENT ILLNESS:  This is a now 65 year old right-handed white  girl who has undergone lumbar fusion several years ago and has done  reasonably well.  She has had increasing pain in her neck, and pain down her  right shoulder.  She had studies of her neck several years ago, and it was  found she had some spondylosis.  However, a recent MR demonstrates  significant spondylytic disease at C3-4, C4-5, and C5-6, with significant  right-sided pathology at this level, and she is now admitted for an anterior  decompression and fusion.   MEDICAL HISTORY:  Remarkable for diabetes, which is under better control  since she has been with Dr. Chalmers Cater.   MEDICATIONS:  1.  Insulin.  2.  Actos.  3.  Premarin 0.125 mg daily.  4.  Nexium 40 mg daily.  5.  BuSpar 50 mg twice a day.  6.  Bupropion 75 mg twice a day.  7.  Trental.   ALLERGIES:  1.  PENICILLIN.  2.  MORPHINE.  3.  NONSTEROIDAL ANTI-INFLAMMATORY MEDICATIONS.   SURGICAL HISTORY:  1.  A diskectomy in the lumbar spine by Dr. Lyman Speller.  2.  Nephrectomy in 1991.  3.  Appendectomy in 1958.  4.  Cholecystectomy in 1953.  5.  Pilonidal cyst in 2000.  6.  Two foot operations.  7.  Carpal tunnel.  8.  Trigger finger.  9.  Lumbar fusion.   SOCIAL HISTORY:  She does not smoke or drink, and is not working.   FAMILY HISTORY:  Mom is 24 with diabetes and kidney failure.  Dad is  deceased; history was not given.   REVIEW OF SYSTEMS:  Remarkable for glasses, hypertension,  hypercholesterolemia, arm weakness, back pain,  arm pain, leg pain, joint  pain, neck pain, anxiety, depression, diabetes, food allergies, and  __________ allergies.   PHYSICAL EXAMINATION:  HEENT:  Within normal limits.  NECK:  She has reason range of motion of the neck; although, she says  sometimes it does have a catch in it.  CHEST:  Clear.  CARDIAC:  Regular rate and rhythm.  ABDOMEN:  Nontender with no hepatosplenomegaly.  EXTREMITIES:  Without clubbing or cyanosis.  Peripheral pulses are good.  GU:  Deferred.  NEUROLOGIC:  She is awake, alert, and oriented.  Cranial nerves are intact.  Motor exam shows 5/5 strength throughout the upper extremities, save for the  right deltoid, which is 5-/5 with some pain limitation.  Sensation is  diminished in her right C5-C6 distribution.  Reflexes are 1 at the left  biceps and triceps, absent at the right.  Hoffman's is  negative.  Lower  extremities are non-myelopathic.   MR results have been reviewed above.   CLINICAL IMPRESSION:  Cervical spondylosis with intractable right shoulder  and arm pain.  She has tried cervical traction to no avail.   PLAN:  An anterior decompression and fusion at C3-4, C4-5, and C5-6.  The  risks and benefits of this approach have been discussed with her, and she  wishes to proceed.           ______________________________  Elizabeth Sauer, M.D.     MWR/MEDQ  D:  07/13/2005  T:  07/13/2005  Job:  [id number]

## 2011-04-02 NOTE — Discharge Summary (Signed)
Andrea Stewart, Andrea Stewart NO.:  [id number]   MEDICAL RECORD NO.:  [id number]          PATIENT TYPE:  INP   LOCATION:  [id number]                         FACILITY:  [facility]   PHYSICIAN:  Pietro Cassis. Alvan Dame, M.D.  DATE OF BIRTH:  Aug 22, 1946   DATE OF ADMISSION:  11/10/2004  DATE OF DISCHARGE:  11/13/2004                                 DISCHARGE SUMMARY   ADMISSION DIAGNOSES:  1.  Severe osteoarthritis of the left knee.  2.  Hypertension.  3.  History of bronchitis.  4.  Gastroesophageal reflux disease.  5.  Diabetes insulin dependent.  6.  Osteoporosis.   DISCHARGE DIAGNOSES:  1.  Severe osteoarthritis of the left knee.  2.  Hypertension.  3.  History of bronchitis.  4.  Gastroesophageal reflux disease.  5.  Diabetes insulin dependent.  6.  Osteoporosis.  7.  Mild postoperative anemia.   OPERATION:  On November 10, 2004 the patient underwent left total knee  replacement arthroplasty with all three components cemented.   CONSULTATIONS:  None.   BRIEF HISTORY:  This 65 year old white female with progressive knee pain  going on for at least two years.  Her left knee is primarily involved at the  point where she has difficulty getting about.  She is a  young lady and has  been very active, but now finds that she has more and more difficulty  getting about, standing, sitting, etc., due to her left knee pain.  X-rays  have shown severe degenerative changes with near bone-on-bone deformity,  particularly in the medial knee.  She has had arthroscopic surgery which  offered her minimal results.  Anti-inflammatories have not given her any  relief as well.  After much discussion including the risks and benefits of  surgery, it was felt that she would benefit from surgical intervention, and  she was admitted for the above procedure.   HOSPITAL COURSE:  The patient tolerated the surgical procedure quite well.  The Hemovac which was placed intraoperatively was removed on the  first post  day without difficulty.  She was placed on Coumadin protocol postoperatively  for prevention of DVT and will continue this for four weeks postop.  She  rapidly weaned from PCA morphine, entered into total knee protocol  ambulating in the hall.  Was participating in physical therapy at full  weightbearing status.  On the day of discharge, she was anxious to go home.  Wound was dry.  Neurovascular was intact in the lower extremity.  She had  achieved 60 degrees of CPM.  It was felt that she could benefit with home  health due to [place] could not be used due to the fact that she had [place]  [facility] which would not cover that facility or that agency, so [facility] was  used.  Hemoglobin was stable, and vital signs were stable.   LABORATORY VALUES IN HOSPITAL:  Hematologically, showed CBC with  differential on admission completely within normal limits with hemoglobin  13.3, hematocrit 39.4.  Final hemoglobin was 9.9 with hematocrit of 29.2.  Blood chemistries all were normal.  Urinalysis was negative for  urinary  tract infection.  Chest x-ray showed no evidence for active chest disease.  Electrocardiogram showed normal sinus rhythm.   CONDITION ON DISCHARGE:  Improved, stable.   PLAN:  The patient is discharged to her home to continue with home health  via [facility].  She may continue weightbearing as tolerated.  Continue with  range of motion.  Continue with home  medications and diet.  Follow up with  Dr. Roque Lias A. Tower as needed.  Prescription for Vicodin #50 with a refill, 1-  2 q.4-6h p.r.n. pain, Robaxin 500 mg for muscle relaxant, and Trinsicon #60  for her anemia is given.  Also, prescription was given for Coumadin for  pharmacy protocol.  Return to see Dr. Alvan Dame about two weeks after the date of  surgery.  Use dry dressing p.r.n.  May shower five days after surgery.     [place]   DLU/MEDQ  D:  11/13/2004  T:  11/13/2004  Job:  [id number]   cc:   Marne A. Glori Bickers, M.D. [facility]

## 2011-04-02 NOTE — Discharge Summary (Signed)
NAMEKENDREA, Andrea Stewart NO.:  [id number]   MEDICAL RECORD NO.:  [id number]          PATIENT TYPE:  INP   LOCATION:  [place]                         FACILITY:  [facility]   PHYSICIAN:  Pietro Cassis. Alvan Dame, M.D.  DATE OF BIRTH:  March 12, 1946   DATE OF ADMISSION:  08/04/2006  DATE OF DISCHARGE:  08/07/2006                                 DISCHARGE SUMMARY   ADMITTING DIAGNOSES:  1. Right knee end-stage osteoarthritis.  2. Diabetes.  3. Coronary artery disease.  4. Hypertension.  5. Neuropathy.  6. History of multiple surgical procedures.   DISCHARGE DIAGNOSES:  1. Osteoarthritis, right knee, status post right knee total knee      arthroplasty.  2. Diabetes.  3. Coronary artery disease.  4. Hypertension.  5. Neuropathy.  6. History of multiple surgical procedures.   PROCEDURE PERFORMED:  On August 04, 2006, right knee arthroplasty by Dr.  Paralee Cancel.  [place] Vanita Ingles.  Anesthesia general.  Blood loss 50 mL.   CONSULTATIONS:  None.   BRIEF HISTORY:  Ms. Greenwalt has been a patient of [facility]  for many years with end-stage arthritis of her right knee.  After  conservative management failed, she elected to proceed with total knee  arthroplasty.   ADMISSION LABORATORY DATA:  Hemoglobin 13.4, hematocrit 39.9.  Serial blood  counts on August 05, 2006 revealed hemoglobin of 9.9, hematocrit 28.8.  On August 06, 2006, hemoglobin of 9.6, hematocrit 28.1.  On August 07, 2006, upon discharge, hemoglobin of 9.7, hematocrit 27.8.  Admitting  differential within normal limits.  Admitting PTT INR was 12.5, 38 and 0.9.  Routine chemistries were followed up.  On August 01, 2006, a pre-  admission glucose was 296.  On August 05, 2006, glucose was 50.  On  August 06, 2006, glucose was 250.  Pre-admission urinalysis within normal  limits.  No postoperative knee x-rays taken.   HOSPITAL COURSE:  Ms. Burcher was admitted to the  hospital, taken to the OR  and underwent the above procedure without complication.  She tolerated the  procedure well and later was sent to the recovery room and then to the  orthopedic floor.  Standing postoperative physician orders for total joint  replacement were followed including Vicodin for pain control with Dilaudid  prescribed for breakthrough pain.   Hemovac was placed at the time of surgery and was pulled on day #1.  It was  found that her hemoglobin was low on postoperative day #1, so we held her  NPH until it returned to normal.  She was followed serially by physical  therapy where she improved on a daily basis, was able to ambulate with a  rolling walker.  On postoperative day #2, the dressing was changed, the  wound was clean, dry and intact.  This glucose was checked, and it returned  to a more stable value.  On day #3, the patient was stable enough to be  discharged home.   DISCHARGE MEDICATIONS AND PLAN:  1. She was discharged home on August 07, 2006.  2. Discharge diagnoses as above.  3.  Discharge medications included Vicodin, Robaxin, Lovenox, iron      supplements and Colace as needed.   DIET:  As tolerated per diabetic.   FOLLOWUP:  Followup 2 weeks from surgery.  Call the office at [phone].   ACTIVITY:  Weightbearing as tolerated.  Assistance of rolling walker.  Total  knee precautions.  Home health PT and home health nursing to monitor her  activity, and Lovenox administration.  May start showering.   DISCHARGE DIAGNOSIS:  Status right total knee arthroplasty.   CONDITION UPON DISCHARGE:  Improved.     ______________________________  Carlean Jews Collene Mares, [place]      Pietro Cassis. Alvan Dame, M.D.  Electronically Signed    BLM/MEDQ  D:  08/15/2006  T:  08/15/2006  Job:  [id number]

## 2011-04-02 NOTE — Op Note (Signed)
NAMEMarland Kitchen  YAKELYN, FITZER NO.:  [id number]   MEDICAL RECORD NO.:  [id number]          PATIENT TYPE:  INP   LOCATION:  2860                         FACILITY:  [place]   PHYSICIAN:  Elizabeth Sauer, M.D.      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   OPERATION/PROCEDURE:  C3-4, C4-5, C5-6 anterior cervical decompression and  fusion with reflex hybrid plate.   SURGEON:  Elizabeth Sauer, M.D.   SERVICE:  Neurosurgery.   ANESTHESIA:  General endotracheal.   PREPARATION:  Betadine and alcohol wipe.   COMPLICATIONS:  None.   NURSE ASSISTANT:  [place].   DOCTOR ASSISTANT:  Hosie Spangle, M.D.   This is a 65 year old girl with severe cervical spondylosis and herniated  disk taken to the operating room, smooth anesthetized, and intubated, placed  supine on the operating table, and halter head traction.  Following shave,  prep, and drape in the usual sterile fashion, the skin was incised  paralleling the sternocleidomastoid muscle for a distance of approximately 6  cm centered over C4-5.  The platysma was identified and elevated, and  divided, and undermined.  The sternocleidomastoid identified and medial  dissection revealed the carotid artery retracted laterally to the left.  Trachea and esophagus retracted laterally to the right exposing the bones of  anterior cervical spine.  Superior and inferior dissection carefully  performed, revealed the common facial vein which is adjoining the jugular,  and this was the top end of the dissection.  The inferior end was the top of  the omohyoid.  Marker was placed.  Intraoperative x-ray obtained and  confirmed correctness level.  Discectomy was carried out under gross  observation, and the operating microscope was  brought in.  We used  microdissection technique to remove the remaining disk and spurring, and  explored the neural foramina bilaterally.  At C3-4 there was mostly  spondylosis with small amount of disk at C4-5.  There is tremendous amount  of spondylosis with a posterior protruding osteophyte and cord compression.  At C5-6 there was mostly disk out in the right C5-6 neural foramen.  Upon  complete decompression and exploration of neural foramina, the wound was  irrigated, hemostasis assured.  6 to 7-mm bone grafts were fashioned from  patellar allograft and tapped into place.  A reflex hybrid plate was then  selected with two screws in C and 12-mm screws were placed; two in C3, two  in C4, two in C5, and two in C6.  Intraoperative x-ray showed good placement  of bone graft, plate, and screws.  The wound was irrigated.  Hemostasis was  assured.  Platysma and  subcutaneous tissues were re-approximated with 3-0 Vicryl in interrupted  fashion.  Skin was closed with 4-0 Vicryl in running subcuticular fashion.  Benzoin and Steri-Strips were placed and made occlusive Telfa with OpSite.  The patient placed in Aspen collar  and returned to recovery room in good  condition.           ______________________________  Elizabeth Sauer, M.D.     MWR/MEDQ  D:  07/13/2005  T:  07/13/2005  Job:  [id number]

## 2011-04-02 NOTE — Consult Note (Signed)
NAMESAQUITA, [name] NO.:  [id number]   MEDICAL RECORD NO.:  [id number]          PATIENT TYPE:  INP   LOCATION:  5010                         FACILITY:  [facility]   PHYSICIAN:  Andrea Stewart, M.D. [facility] OF BIRTH:  1946-04-21   DATE OF CONSULTATION:  02/24/2006  DATE OF DISCHARGE:                                   CONSULTATION   I was asked by Dr. Theda Sers to evaluate this patient in regards to medical  management for pulmonary and diabetic issues.   IMPRESSION:  1.  Status post right shoulder repair.  2.  Mild laryngeal edema currently with no respiratory distress.  3.  Diabetes, markedly elevated blood sugar.   RECOMMENDATIONS:  1.  Continued observation in regards to respiratory function.  2.  Patient will be maintained on her home insulin regimen plus sliding      scale and this will be checked through the night.   HISTORY OF PRESENT ILLNESS:  Patient is a pleasant 65 year old patient with  arthritic problems who came for shoulder repair via arthroscopy.  Patient  has a short neck and thick body habitus and there was a question of whether  she had some mild laryngeal edema secondary to intubation.  Postoperative in  the PACU she did seem to have some wheezing and mild shortness of breath.  For this reason it was felt prudent to admit her for a 23-hour observation  to make sure she had no further laryngeal edema or spasm or respiratory  distress.   PAST MEDICAL HISTORY:   SURGICAL:  1.  Appendectomy 1958.  2.  Low back surgery 1971.  3.  Cholecystectomy 1962.  4.  Voluntary nephrectomy for donation 1991.  5.  Hand and foot surgery in the past.  6.  Hysterectomy.   MEDICAL ILLNESSES:  1.  Diabetes, insulin-dependent.  2.  Hypertension.  3.  GERD.  4.  Degenerative joint disease.  5.  Peripheral neuropathy.   HOME MEDICATIONS:  1.  Cymbalta 60 mg daily.  2.  Actos 45 mg daily.  3.  Diovan/HCT 160/12.5 mg daily.  4.  Lyrica 75 mg b.i.d.  5.   Calcium daily.  6.  Nexium 40 mg daily.  7.  Aspirin 325 mg daily.  8.  Tylenol.  9.  Multivitamin.  10. Insulin 24 units a.m., 20 units h.s. of NPH, Humalog p.r.n. per Dr.      Chalmers Cater, her endocrinologist.   FAMILY HISTORY:  Father died of emphysema at 55.  Mother lived to be in her  late 68s, had coronary artery disease and diabetes.  Patient has a brother  with diabetes, two sisters who are healthy.   PHYSICAL EXAMINATION:  VITAL SIGNS:  Patient's vital signs were stable.  LUNGS:  In the sitting position patient had good breath sounds with no  rales, wheezes, or rhonchi, no increased work of breathing.  CARDIOVASCULAR:  2+ radial pulse.  She had a regular rate and rhythm.  ABDOMEN:  Soft.  No guarding.  No rebound.  No organosplenomegaly.   DATABASE:  CBG elevated greater than 400.  No other  laboratory available.   In summary, recommendations as above.  I expect the patient will be  stabilized and able to be discharged in a.m. and with routine follow-up by  Dr. Glori Bickers for medical problems, Dr. Chalmers Cater for endocrinology.           ______________________________  Andrea Stewart, M.D. [facility]     MEN/MEDQ  D:  02/24/2006  T:  02/25/2006  Job:  [id number]   cc:   Marne A. Glori Bickers, M.D. [facility]  [address] [place]  [name] [address]   Glendell Docker, M.D.   Jacelyn Pi, M.D.  Fax: [phone]

## 2011-04-02 NOTE — H&P (Signed)
NAME:  Andrea Stewart, Andrea Stewart NO.:  [id number]   MEDICAL RECORD NO.:  [id number]          PATIENT TYPE:  INP   LOCATION:  NA                           FACILITY:  [facility]   PHYSICIAN:  Pietro Cassis. Alvan Dame, M.D.  DATE OF BIRTH:  [birth year]   DATE OF ADMISSION:  DATE OF DISCHARGE:                                HISTORY & PHYSICAL   CHIEF COMPLAINT:  Ms. Ghosh comes in for chief complaint of left knee  pain.   HISTORY OF PRESENT ILLNESS:  The patient has had left knee pain for the past  one to two years that has been refractory to conservative treatment.  The  patient has elected to have a left total knee arthroplasty to be completed  by Dr. Paralee Cancel on November 10, 2004.   ALLERGIES:  The patient has allergies to PENICILLIN, MORPHINE and NSAID's.   MEDICATIONS:  1.  Actos 45 mg daily.  2.  Diovan 160/12.5 mg daily.  3.  Topamax 25 mg two tablets daily.  4.  Cymbalta 60 mg daily.  5.  Nexium 40 mg daily.  6.  Aspirin 325 mg daily.  7.  Vitamins.  8.  Tylenol arthritis q.6h. PRN pain.  9.  Humalog 75/25 units, 29 units q.a.m. before breakfast, 19 units before      dinner and she has Humalog 3 units on a sliding scale insulin schedule      with blood sugars above 200.  10. Ultram 50 mg p.o. PRN q.4-6h.   PAST MEDICAL HISTORY:  Hypertension, bronchitis, gastroesophageal reflux  disease, diabetes, insulin-dependent and osteoporosis.   SOCIAL HISTORY:  Patient is married.  She is disabled.  She smokes one pack  of cigarettes per day.  She does not drink.  She has four children.   FAMILY HISTORY:  Family medical history includes hypertension, coronary  artery disease, diabetes, colon and prostate cancer.   REVIEW OF SYMPTOMS:  Difficulty ambulating with pain in the left knee.   PHYSICAL EXAMINATION:  VITAL SIGNS: Pulse 80, respirations 18, blood  pressure 138/78.  GENERAL:  Patient is a healthy 65 year old female in no acute distress,  pleasant in mood and affect,  alert and oriented X3.  HEENT:  Examination of head and neck shows cranial nerves II-XII grossly  intact.  She has no tenderness of the cervical, thoracic or lumbar spines.  No tenderness over the paraspinal muscles either.  She has full range of  motion without difficulty.  CHEST:  Examination shows active breath sounds bilaterally with no wheezes,  rhonchi or rales.  HEART:  Shows regular rate and rhythm with no murmurs.  ABDOMEN:  Shows active bowel sounds in all four quadrants, nontender,  nondistended.  No pulsatile masses.  EXTREMITIES:  Moderate tenderness with any type of movement or weight-  bearing on the left knee.  She has obvious crepitus with range of motion and  moderate medial joint line tenderness with trace effusion bilaterally.  Stable ligaments X4.  No popliteal mass.  Calves are nontender.  She does  have an antalgic gait.  SKIN:  Shows no rashes or infections.  CLINICAL DATA:  X-rays of left knee show osteoarthritis which is severe.   IMPRESSION:  Left knee osteoarthritis, severe.   PLAN:  Patient is to have a left total knee arthroplasty by Dr. Alvan Dame on  November 10, 2004.     Blair Heys   TBD/MEDQ  D:  11/03/2004  T:  11/03/2004  Job:  [id number]

## 2011-04-02 NOTE — Op Note (Signed)
NAMEEVADEAN, SAVARIA NO.:  [id number]   MEDICAL RECORD NO.:  [id number]          PATIENT TYPE:  INP   LOCATION:  0002                         [facility]ACILITY:  [facility]   PHYSICIAN:  Pietro Cassis. Alvan Dame, M.D.  DATE OF BIRTH:  1946-01-22   DATE OF PROCEDURE:  11/10/2004  DATE OF DISCHARGE:                                 OPERATIVE REPORT   PREOPERATIVE DIAGNOSIS:  Left knee end-stage osteoarthritis.   POSTOPERATIVE DIAGNOSIS:  Left knee end-stage osteoarthritis.   PROCEDURE:  Left total knee replacement.   COMPONENTS USED:  DePuy rotating platform knee system with a size 2.5 femur,  2.5 tibia, and a 10-mm posterior stabilized polyethylene liner and 35-mm  patella.   SURGEON:  Pietro Cassis. Alvan Dame, M.D.   ASSISTANT:  Kipp Brood. Gladstone Lighter, M.D.   ANESTHESIA:  General.   BLOOD LOSS:  100 cc.   TOURNIQUET TIME:  65 minutes at 300 mmHg.   COMPLICATIONS:  None.   DRAINS:  One.   INDICATIONS FOR PROCEDURE:  Ms. Loges is a very pleasant 65 year old  female who I have been following for some time for some left knee  degenerative changes. She was initially seen and evaluated for mechanical  symptoms and underwent a knee arthroscopy. She subsequently had difficult  time recovering fully from the arthroscopic surgery, more so from exposed  arthritic conditions of the knee. We tried to manage her knee from a  conservative standpoint as long as possible including injections, activity  modification, exercise, anti-inflammatory medications which she all failed.  Given this point, she wished to proceed with a final procedure in order to  maximize pain relief. We discussed risks and benefits and need for surgery  including infection, component failure, DVT, bleeding, damage to other  structures. She consents for the above procedure.   PROCEDURE IN DETAIL:  The patient was brought to the operative theater. Once  adequate anesthesia and preoperative antibiotics, 1 g of vancomycin due  to  penicillin allergy, were administered, the patient was positioned in a  supine position and bump was placed underneath the left hip. Left lower  extremity was prepped and draped in sterile fashion over a proximal thigh  nonsterile tourniquet. A midline incision was made followed by exposure of  the subcutaneous flaps at the level of the extensor mechanism. Medial  parapatellar arthrotomy was made running along the border of the quad tendon  and vastus medialis. Expose of knee was carried out. Removal of osteophytes  from the meniscus allowed for exposure of the knee. Femur was addressed  first and intramedullary guide placed. Ten mm of bone were resected at 5  degrees of valgus on the left knee. At this point, the sizing jig was  placed, and it determined a size 2.5 femur would fit best. At this point,  2.5-mm cuts were made, anterior, posterior chamfer cuts. Following this, the  box cut was made based off the lateral aspect of the medial femoral condyle.  At this point, attention was directed to the tibia. Note that palpation of  the posterior aspect of femur revealed no significant osteophytes could be  debrided. Following exposure of the tibia, 10 mm of bone was resected off of  the tibial base of the lateral plateau. Extramedullary guide was utilized,  passing through the center of the knee proximally as well as throught the  mid talus distally.  Slope matched the patient's anatomic slope. Size of the  tibial cut determined it was a size 2.5. Trial reduction was carried out.  The knee came to full extension, and it was stable in extension and flexion.  Attention was now directed to the patella.  Pre-cut patellar measurement was  approximately 23, cut the patella down to between 14 and 15. A size 35  patella fit nicely on this cut surface. Holes were drilled. Patella tracked  without bone pressure. With all this information, final attention was  directed to preparation of the tibia.  Note, the posterior stabilized fixed  bearing insert was utilized to match rotation on the tibial plateau. Tibial  preparation was carried out per protocol. Final components were brought to  the field, including the tibial polyethylene, given the stability of the  component was not going to increase the size. The knee was copiously  irrigated with pulse lavage solution, debrided of any loose fragments,  synovial tissue, or fat. Cement was prepared. Once cement was mixed and  ready, the tibia was cemented in first followed by placement of the tibial  tray followed by this femur, then the patella. The knee was brought to  extension to allow for cement to cure. Once the cement had cured, excess  cement was debrided throughout the knee and removed with use of hemostats  and suction. The final 10-mm poly was then placed. The patella tracked, as  noted on trial range of motion, the knee was stable in extension and  flexion. At this point, the tourniquet was let down. The knee was copiously  irrigated with normal saline solution, and hemostasis was obtained that  needed to be done. There was some generalized ooze but nothing significant.  At this point, the extensor mechanism was repaired with #1 PDS suture,  followed by reapproximation of the subcutaneous layer and skin with 4-0  Monocryl. The patient's knee was then cleaned, dried and dressed sterilely  with Steri-Strips, and a sterile bulky Jones dressing  and the patient was  transferred to the recovery room extubated in stable condition.     Matt   MDO/MEDQ  D:  11/10/2004  T:  11/10/2004  Job:  [id number]

## 2011-04-02 NOTE — Cardiovascular Report (Signed)
[place]. [facility]  Patient:    Andrea, Stewart Visit Number: [id number] MRN: [id number]          Service Type: CAT Location: [facility] [id number] Attending Physician:  Fatima Sanger Dictated by:   Vanna Scotland Olevia Perches, M.D. [facility] Proc. Date: 04/24/02 Admit Date:  04/24/2002 Discharge Date: 04/24/2002   CC:         Benita Stabile, M.D., [place], [place].  Cardiopulmonary Laboratory   Cardiac Catheterization  PROCEDURES PERFORMED: Cardiac catheterization.  CLINICAL HISTORY: The patient is 65 years old and has positive risk factors fop coronary disease including diabetes, hyperlipidemia, history of hypertension and family history of coronary disease and a previous history of cigarette smoking until five months ago. On Mar 20, 2002, when she was under some stress involved with putting her mother in a nursing home, she developed chest heaviness. She was seen by Margarita Sermons and myself in consultation and we scheduled a Cardiolite scan, which suggested an apical scar with no ischemia. Because of this, we recommended evaluation with angiography.  DESCRIPTION OF PROCEDURE: The procedure was performed via the right femoral artery using an arterial sheath and 6 French preformed coronary catheters.  A front wall arterial puncture was performed and Omnipaque contrast was used. After completion of the diagnostic study, we closed the right femoral artery with Perclose. The patient tolerated the procedure well and left the laboratory in satisfactory condition.  RESULTS: The left main coronary artery was free of significant disease.  Left anterior descending gave rise to two diagonal branches and three septal perforators. These and the LAD proper were free of significant disease.  The circumflex artery gave rise to a small intermediate branch, two marginal branches and two posterolateral branches. These vessels were free of significant disease.  The right coronary artery  was a small vessel and gave rise to a right ventricular branch and a posterior descending branch. These vessels were free of significant disease.  LEFT VENTRICULOGRAPHY: The left ventriculogram performed in the RAO projection showed good wall motion with no areas of hypokinesis. The estimated ejection fraction was 60%.  The aortic pressure was 130/67 with a mean of 92 and left ventricular pressure was 130/16.  CONCLUSIONS: Normal coronary angiography and left ventricular function.  RECOMMENDATIONS: In view of these findings, I think the patients recent symptoms are not likely cardiac. Her scan was probably related to breast attenuation. We will plan reassurance and let her go home as an outpatient today with a followup groin check in one week. Dictated by:   Vanna Scotland Olevia Perches, M.D. [place] Attending Physician:  Fatima Sanger DD:  04/24/02 TD:  04/26/02 Job: [id number]

## 2011-04-02 NOTE — Assessment & Plan Note (Signed)
[facility]                               PULMONARY OFFICE NOTE   Andrea Stewart, Andrea Stewart                    MRN:          [id number]  DATE:08/02/2006                            DOB:          February 28, 1946    REFERRING PHYSICIAN:  Marne A. Tower, M.D.   I had the pleasure of meeting Andrea Stewart today with her sister-in-law for  evaluation of her respiratory difficulties.  She says that she is due to  have right knee replacement on August 04, 2006 at [facility] by  Dr. Alvan Dame.  Apparently she had undergone shoulder surgery by Dr. Theda Sers in  April 2004 at [facility].  Preoperatively she apparently was  a difficult airway requiring multiple attempts prior to intubation, and then  postoperatively after being extubated she had some difficulties with  respiratory distress which was felt to be on the basis of upper airway  edema.  As a result she was transferred to [facility] for 23 hours  of observation but did not require reintubation or any assistant mode of  ventilation.  Subsequent to that, she said that her breathing has been  reasonably stable.  She is not having any problems as far as coughing,  wheezing, sputum production, hoarseness or dyspnea on exertion.  She says  her exercise tolerance is limited mostly because of joint pains and back  pain.  She says that she does snore, although she is not sure if she  actually stops breathing while she is asleep.  She tends to breathe more  through her mouth at night as well as get a dry mouth at night.  She says  she is not able to sleep on her back, but this is she feels more because of  back pain.  She says that she does occasionally falls asleep during the day  and sometimes feels sleepy during the day.  She is not currently using  anything to help her stay awake during the day or fall asleep at night.  She  does occasionally talk in her sleep, but there is no history  of sleep  walking.  She denies any symptoms of restless leg syndrome.  She also denies  any symptoms of sleep hallucinations, sleep paralysis or cataplexy.  She  currently smokes about 1/4 a pack of cigarettes per day, but this is down  from as much as one pack of cigarettes per day.  She has been smoking for  approximately the last 30 years.  She had undergone a chest x-ray on  August 01, 2006 which was essentially negative for any active lung  disease, although there was some mild hyperinflation.   PAST MEDICAL HISTORY:  Otherwise significant for hypertension, diabetes,  diabetic neuropathy, obesity.  She is status post cholecystectomy.  She has  had cervical spine surgery in August 2006 and lumbar fusion in March 2004.  She had a partial hysterectomy.  She had an appendectomy at the age of 22.  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  She has had  right shoulder surgery in  April 2007.  She had elbow surgery in 2001.  She  has had numerous trigger finger releases, and she has had foot surgery.   CURRENT MEDICATIONS:  1. Cymbalta 60 mg daily.  2. Actos 45 mg daily.  3. Diovan 160/12.5 mg daily.  4. Nexium 40 mg daily.  5. Lyrica 75 mg b.i.d.  6. Aspirin 325 mg daily.  7. Calcium 1200 mg daily.  8. Tylenol Arthritis 2 pills daily.  9. Multivitamin once a day.  10.Humulin-N NPH 26 units in the morning and 20 units at night.  11.Humalog 3 units as needed if her blood sugar is over 140.   ALLERGIES:  PENICILLIN, MORPHINE, AND ANTI-INFLAMMATORY MEDICINES.   FAMILY HISTORY:  Significant for her mother who had heart disease and colon  cancer, her father who had COPD and prostate cancer, and a brother who has  sleep apnea.   SOCIAL HISTORY:  She is married.  She has 4 children and 2 stepchildren.  She is currently on disability.  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  She  has a smoking history as stated above.  She denies alcohol use.  She denies  any significant  exposures to dust or chemicals.   REVIEW OF SYSTEMS:  She complains of stiffness in her back and her knees.  Otherwise it is essentially negative.   PHYSICAL EXAMINATION:  She is 5 feet 2 inches tall, 213 pounds.  Temperature  is 97.9, blood pressure is 116/80, heart rate is 86, oxygen saturation is  95% on room air.  HEENT:  Pupils are reactive.  There is no sinus tenderness.  She has a  Mallampatic-4 airway with an enlarged tongue and low-lying soft palate with  oropharyngeal crowding.  There is no lymphadenopathy, no thyromegaly.  HEART:  S1 S2, regular rhythm.  CHEST:  Clear to auscultation.  ABDOMEN:  Obese, soft, nontender.  EXTREMITIES:  There is 1+ ankle edema.  No cyanosis or clubbing.  NEUROLOGIC EXAM:  She is alert and oriented x3 and able to move all four  extremities.   IMPRESSION:  1. I would be concerned that she may in fact have some degree of      obstructive sleep apnea.  I discussed this with her at length.      Ideally, I would have her undergo an overnight polysomnogram to further      evaluate this.  I feel that this would be important, particularly in      light of the fact that she does have a history of diabetes and      hypertension as well as her sleep difficulties and her physical      findings.  With regards to her perioperative managing, what I would      recommend is to minimize the use of pain medications as best as      possible.  She has informed me that there is a consideration of doing      the procedure under spinal anesthesia as well.  If she should need      intubation and mechanical ventilation perioperatively, then appropriate      precaution should be made to manage her difficult airway.      Additionally, if needed she could be placed on auto-CPAP with a range      of 5 to 15 cm of water empirically postoperatively if she were having      difficulties with her upper airway or maintaining her oxygenation. 2. History of tobacco  abuse.  She  states that she has had intermittent      episodes of bronchitis requiring the use of inhalers when she has an      upper airway infection, but in between these episodes she has not had      any difficulty with regards to asthma or COPD-like symptoms.      Additionally she says that she does not appear to be limited with      regards to her exercise tolerance from a pulmonary standpoint and, in      fact, it is mostly related to her joint disease that limits her      exercise tolerance.  Ideally, I have advised her that she should stop      smoking at least 6 weeks prior to surgery to gain maximal benefit from      risk reduction with regards to smoking cessation, although I have      advised her that regardless of this it would be in her best interest to      stop smoking.  I do not feel that she will need to undergo pulmonary      function tests at this time, and I do not feel that she will need to be      started on an inhaler regimen at this time, again because she is not      having much with regards to symptoms related to her breathing status.      I would also plan on following up with her in approximately 2 months at      which time hopefully she will have recovered to a reasonable degree      from her knee surgery.  Then we can further assess pursuing an      overnight polysomnogram.   In summary, again with regards to her risk assessment prior to undergoing  knee surgery, I feel that she would be at increased risk of having  perioperative complications most likely related to difficulties with airway  management, but I do not feel that this should preclude her from being able  to undergo the surgical procedure.                                   Chesley Mires, MD   VS/MedQ  DD:  08/02/2006  DT:  08/02/2006  Job #:  [id number]   cc:   Marne A. Glori Bickers, MD  Pietro Cassis. Alvan Dame, M.D.

## 2011-04-02 NOTE — H&P (Signed)
NAME:  Andrea Stewart, Andrea Stewart NO.:  [id number]   MEDICAL RECORD NO.:  [id number]          PATIENT TYPE:   LOCATION:                                 FACILITY:   PHYSICIAN:  Pietro Cassis. Alvan Dame, M.D.       DATE OF BIRTH:   DATE OF ADMISSION:  08/04/2006  DATE OF DISCHARGE:                                HISTORY & PHYSICAL   PRINCIPAL DIAGNOSIS:  Right knee end-stage osteoarthritis.   SECONDARY DIAGNOSIS:  Includes history of diabetes, coronary artery disease,  hypertension, neuropathy, history of multiple surgical procedures.   HISTORY OF PRESENT ILLNESS:  Andrea Stewart is a 65 year old female well-known  to my practice for bilateral knee osteoarthritis.  She is status post a left  total knee replaced in the past.  She also had multiple issues regarding  cervical spine, shoulder problems as well as carpal tunnel and hand issues.  She has successfully made it through her left total knee replacement though  the therapy was tough.  She has had progressive and increasing discomfort in  this right knee and is here to progress with the right knee replacement  surgery.  She has failed conservative measures and her quality of life was  not very good due to her pain.  She has had insulin-dependent diabetes and  cortisone injection and other conservative measures have failed providing  and symptomatic relief without causing significant problems.   PAST MEDICAL HISTORY:  Includes lumbar spine surgery, left total knee  replacement, cervical fusion, cholecystectomy, left elbow surgery, partial  hysterectomy.  Her medical history includes coronary artery disease,  hypertension, neuropathy changes due to her diabetes, bronchitis,  depression.   FAMILY HISTORY:  Coronary artery disease, hypertension, cancer.   SOCIAL HISTORY:  She lives with her husband and tries to remain active as  much as possible.  She has a family with grand kids.  She reports smoking  history as  well as  occasional alcohol.   DRUG ALLERGIES:  PENICILLIN, MORPHINE AND NSAIDS.   MEDICATIONS:  Cymbalta, Actos, Diovan, Nexium, Lyrica, aspirin, calcium,  vitamins, Tylenol Arthritis, insulin NPH and Humalog.   REVIEW OF SYSTEMS:  Otherwise unremarkable other than that noted in HPI.  No  evidence of any upper respiratory, pulmonary, cardiac, gastrointestinal,  genitourinary issues over the past 2 weeks.   PHYSICAL EXAMINATION:  She had a temperature of 98.4 today, pulse 76 and  blood pressure 134/76.  GENERAL:  She is awake, alert and oriented in no acute distress.  CHEST:  Today was clear to auscultation bilaterally.  No wheezing.  HEART:  Has a regular rate and rhythm.  No murmurs detected.  ABDOMEN: Soft and nontender.  Positive bowel sounds.  EXTREMITIES:  Reveal a well-healed left knee incision with slight flexion  contracture of a few degrees with flexion of 120 degrees. As for the right  knee, she had painful crepitation, slight swelling with pain mainly  medially.  Overall her skin is intact with no evidence of any psoriatic  changes or cellulitic change of the right knee.   Labs were pending.  Her evaluation at  [facility], her x-rays indicate a  severe end-stage degenerative changes of the right knee medial and anterior  femoral.   IMPRESSION:  Right knee end-stage osteoarthritis in a female with a history  of diabetes and hypertension.   PLAN:  The patient will be admitted for same-day surgery  on 08/04/2006.  She  had been through this process before for on the left and is understanding  the demands put on her body for the postoperative course and perioperative  course. The risks and benefits were discussed.  Consent will be obtained  based off our discussions in the office.      Pietro Cassis Alvan Dame, M.D.  Electronically Signed     MDO/MEDQ  D:  08/01/2006  T:  08/01/2006  Job:  [id number]

## 2011-04-02 NOTE — Op Note (Signed)
NAMEMarland Kitchen  ASHELEE, ROTAR NO.:  [id number]   MEDICAL RECORD NO.:  [id number]                   PATIENT TYPE:  INP   LOCATION:  2113                                 FACILITY:  [facility]   PHYSICIAN:  Elizabeth Sauer, M.D.                   DATE OF BIRTH:  Jul 30, 1946   DATE OF PROCEDURE:  DATE OF DISCHARGE:                                 OPERATIVE REPORT   PREOPERATIVE DIAGNOSIS:  Severe spondylosis L4-5, L5-S1.   POSTOPERATIVE DIAGNOSIS:  Severe spondylosis L4-5, L5-S1.   PROCEDURE:  L4-5, L5-S1 laminectomy, diskectomy, posterior lumbar interbody  fusion with a Ray Threaded Fusion Cage.   SURGEON:  Elizabeth Sauer, M.D.   ASSISTANTS:  Nurse assistant:  Chyrl Civatte.  Doctor assistant:  Earleen Newport, M.D.   ANESTHESIA:  General endotracheal.   PREPARATION:  Sterile Betadine prep and scrub with alcohol wipe.   COMPLICATIONS:  Fractured cryo ganglion knife with retained fragment.   DESCRIPTION OF PROCEDURE:  This is a 65 year old lady with severe lumbar  spondylosis at L4-5 and S1.  She had had a prior laminectomy-diskectomy on  the right side at 4-5.  She was taken to the operating room and smoothly  anesthetized and intubated and placed prone on the operating table.  Following shave, prep, and drape in the usual sterile fashion, the skin was  incised starting at mid-L3, carried down to S1.  The laminae, transverse  processes of L4, L5, and S1 were exposed bilaterally and intraoperative x-  ray confirmed correctness of the level.  The pars interarticularis, lamina,  and inferior facet of L4 and L5 and the superior facet of L5 and S1 were  removed bilaterally using the high-speed drill.  This resulted in  decompression of the nerve roots.  There was severe compression of both 5  roots in the lateral recesses and in the neural foramina.  Similarly the 4  roots were significantly encumbered by bone and ligamentum flavum.  Once the  nerve roots were  decompressed, diskectomy was carried out.  The disk spaces  were very tight with minimal, if any, disk material remaining within them.  On the left side the 4-5 disk space was being explored and the ganglion  knife fractured within the disk space.  It was left there at that time and  the remaining diskectomy was carried out.  Ray Threaded Fusion Cages were  then placed at each level.  After placing the right-sided Ray Cage, the left  side was explored and the tip of the ganglion knife could not be recovered  despite numerous efforts.  It was therefore elected to proceed with the Ray  Cage at that level.  The Ray Cage was placed.  Intraoperative x-ray showed  that the ganglion knife fragment was within the confines of the disk space  pressed up against the anterior annulus by the Ray Cage.  The  cages were  packed with bone graft harvested from the facet joints.  It was decided not  to pursue segmental instrumentation because the Ray Cages were very tight  and it was felt that adding pedicle screws was superfluous.  Intertransverse  posterolateral arthrodesis was carried out using the bone graft and DDB bone  matrix.  The fascia was then reapproximated with 0 Vicryl in interrupted  fashion, the  subcutaneous tissue was reapproximated with 0 Vicryl in interrupted fashion,  and the skin was closed with 3-0 nylon in a running locked fashion.  A  Betadine Telfa dressing was applied and made occlusive with OpSite and the  patient returned to the recovery room in good condition.                                                Elizabeth Sauer, M.D.    MWR/MEDQ  D:  01/18/2003  T:  01/19/2003  Job:  [id number]

## 2011-06-08 ENCOUNTER — Encounter: Payer: Self-pay | Admitting: Cardiology

## 2011-06-24 ENCOUNTER — Encounter: Payer: Self-pay | Admitting: Internal Medicine

## 2011-07-13 ENCOUNTER — Other Ambulatory Visit: Payer: Self-pay | Admitting: Internal Medicine

## 2011-07-13 NOTE — Telephone Encounter (Signed)
ref

## 2011-07-26 ENCOUNTER — Ambulatory Visit: Payer: Medicare Other | Admitting: Internal Medicine

## 2011-07-29 ENCOUNTER — Encounter: Payer: Self-pay | Admitting: Internal Medicine

## 2011-07-30 ENCOUNTER — Encounter: Payer: Self-pay | Admitting: Internal Medicine

## 2011-07-30 ENCOUNTER — Ambulatory Visit (INDEPENDENT_AMBULATORY_CARE_PROVIDER_SITE_OTHER): Payer: Medicare Other | Admitting: Internal Medicine

## 2011-07-30 VITALS — BP 136/86 | HR 80 | Temp 97.6°F | Resp 16 | Ht 62.0 in | Wt 177.5 lb

## 2011-07-30 DIAGNOSIS — R5381 Other malaise: Secondary | ICD-10-CM

## 2011-07-30 DIAGNOSIS — E538 Deficiency of other specified B group vitamins: Secondary | ICD-10-CM

## 2011-07-30 DIAGNOSIS — R5383 Other fatigue: Secondary | ICD-10-CM

## 2011-07-30 DIAGNOSIS — R42 Dizziness and giddiness: Secondary | ICD-10-CM

## 2011-07-30 DIAGNOSIS — E119 Type 2 diabetes mellitus without complications: Secondary | ICD-10-CM

## 2011-07-30 DIAGNOSIS — G589 Mononeuropathy, unspecified: Secondary | ICD-10-CM

## 2011-07-30 DIAGNOSIS — E785 Hyperlipidemia, unspecified: Secondary | ICD-10-CM

## 2011-07-30 DIAGNOSIS — G629 Polyneuropathy, unspecified: Secondary | ICD-10-CM

## 2011-07-30 LAB — LDL CHOLESTEROL, DIRECT: Direct LDL: 43.6 mg/dL

## 2011-07-30 MED ORDER — MECLIZINE HCL 12.5 MG PO TABS: 12.5000 mg | ORAL_TABLET | Freq: Three times a day (TID) | ORAL | Status: DC | PRN

## 2011-07-30 NOTE — Progress Notes (Signed)
Subjective:    Patient ID: Andrea Stewart, female    DOB: 1946-08-19, 65 y.o.   MRN: [id number]  HPI  Presents with history of ankle pain, but scehdueld visit for evaluation due to recent episode of dizziness with vertigo which occurred at her grandaughter's birthday party last weekend. No recent URI or heavy physical exertion.  She accepted a friend's meclizine and it knocked her out. Symptoms resolved after an hour.  No prior episodes.  Does note recent development of right temple/parietal headache which has not been occurring not daily and is not severe and not accompanied by vision changes.  Headaches have ben relieved with tylenol.  No pattern.   Past Medical History  Diagnosis Date  . Myalgia and myositis, unspecified   . Plantar fascial fibromatosis   . Personal history of tobacco use, presenting hazards to health     1/2 ppd  . Degeneration of intervertebral disc, site unspecified   . Backache, unspecified     s/p c-spine and l-spine fusions  . Obesity, unspecified   . Disorder of bone and cartilage, unspecified   . Unspecified hereditary and idiopathic peripheral neuropathy     secondary to diabetes  . Unspecified essential hypertension   . Type II or unspecified type diabetes mellitus without mention of complication, not stated as uncontrolled   . Depressive disorder, not elsewhere classified   . GERD (gastroesophageal reflux disease)   . S/p nephrectomy   . S/P cholecystectomy   . Hx of left heart catheterization by cutdown     7 years ago; no significant cad   Current Outpatient Prescriptions on File Prior to Visit  Medication Sig Dispense Refill  . acetaminophen (TYLENOL) 650 MG CR tablet Take 1,300 mg by mouth daily as needed.        Marland Kitchen albuterol (PROVENTIL HFA) 108 (90 BASE) MCG/ACT inhaler Inhale 2 puffs into the lungs every 4 (four) hours as needed.        . ALPRAZolam (XANAX) 0.25 MG tablet Take 0.25 mg by mouth at bedtime as needed.        Marland Kitchen aspirin 325 MG tablet  Take 325 mg by mouth daily.        . benzonatate (TESSALON) 100 MG capsule Take 100 mg by mouth 3 (three) times daily as needed.        . calcium carbonate (OS-CAL) 600 MG TABS Take 600 mg by mouth daily.        Marland Kitchen DAILY MULTIPLE VITAMINS PO Take 1 tablet by mouth daily.        . fluticasone (VERAMYST) 27.5 MCG/SPRAY nasal spray Place 2 sprays into the nose as directed.        . Fluticasone-Salmeterol (ADVAIR DISKUS) 250-50 MCG/DOSE AEPB Inhale 1 puff into the lungs as directed.        . insulin lispro (HUMALOG) 100 UNIT/ML injection Inject into the skin as directed.        . metFORMIN (GLUMETZA) 1000 MG (MOD) 24 hr tablet Take 1,000 mg by mouth 2 (two) times daily with a meal.        . mometasone (NASONEX) 50 MCG/ACT nasal spray Place 2 sprays into the nose daily.        . nitroGLYCERIN (NITROSTAT) 0.4 MG SL tablet Place 0.4 mg under the tongue every 5 (five) minutes as needed. Maximum 3 tablets       . omeprazole (PRILOSEC) 40 MG capsule TAKE ONE CAPSULE BY MOUTH EVERY DAY  30 capsule  6  .  valsartan-hydrochlorothiazide (DIOVAN-HCT) 320-25 MG per tablet Take 1 tablet by mouth daily.        Marland Kitchen zolpidem (AMBIEN) 10 MG tablet Take 10 mg by mouth at bedtime as needed.           Review of Systems  Constitutional: Negative for fever, chills and unexpected weight change.  HENT: Negative for hearing loss, ear pain, nosebleeds, congestion, sore throat, facial swelling, rhinorrhea, sneezing, mouth sores, trouble swallowing, neck pain, neck stiffness, voice change, postnasal drip, sinus pressure, tinnitus and ear discharge.   Eyes: Negative for pain, discharge, redness and visual disturbance.  Respiratory: Negative for cough, chest tightness, shortness of breath, wheezing and stridor.   Cardiovascular: Negative for chest pain, palpitations and leg swelling.  Musculoskeletal: Negative for myalgias and arthralgias.  Skin: Negative for color change and rash.  Neurological: Positive for dizziness and  headaches. Negative for weakness and light-headedness.  Hematological: Negative for adenopathy.       Objective:   Physical Exam  Constitutional: She is oriented to person, place, and time. She appears well-developed and well-nourished.  HENT:  Mouth/Throat: Oropharynx is clear and moist.  Eyes: EOM are normal. Pupils are equal, round, and reactive to light. No scleral icterus.  Neck: Normal range of motion. Neck supple. No JVD present. No thyromegaly present.  Cardiovascular: Normal rate, regular rhythm, normal heart sounds and intact distal pulses.   Pulmonary/Chest: Effort normal and breath sounds normal.  Abdominal: Soft. Bowel sounds are normal. She exhibits no mass. There is no tenderness.  Musculoskeletal: Normal range of motion. She exhibits no edema.  Lymphadenopathy:    She has no cervical adenopathy.  Neurological: She is alert and oriented to person, place, and time.  Skin: Skin is warm and dry.  Psychiatric: She has a normal mood and affect.          Assessment & Plan:  Vertigo:  She had a brief self limiting epsiode lasting 20 minutes to one hour. etiology unclear.  Her neurologic exam was normal today except for a slight tremor of her left hand .  She is at increased risk of CVA due to diabetes, hypertension and AD.  Lipid status is unknown currently hyperlipidemia so will have low threshhold for workup .

## 2011-07-30 NOTE — Patient Instructions (Signed)
Your attack of vertigo may never happen again.  Your exam was normal except for a slight tremor of your right hand.  If your headaches or your vertigo becomes more frequent,  Please call and we will schedule an MRI of your brain.

## 2011-08-01 NOTE — Assessment & Plan Note (Signed)
She is due for hgba1c and urine micro alb/cr ratio.

## 2011-08-02 LAB — COMPREHENSIVE METABOLIC PANEL
BUN: 19 mg/dL (ref 6–23)
CO2: 19 mEq/L (ref 19–32)
GFR: 58.24 mL/min — ABNORMAL LOW (ref >60.00)
Glucose, Bld: 252 mg/dL — ABNORMAL HIGH (ref 70–99)
Sodium: 140 mEq/L (ref 135–145)
Total Bilirubin: 0.5 mg/dL (ref 0.3–1.2)
Total Protein: 7.3 g/dL (ref 6.0–8.3)

## 2011-08-02 LAB — HEMOGLOBIN A1C: Hgb A1c MFr Bld: 9.1 % — ABNORMAL HIGH (ref 4.6–6.5)

## 2011-08-02 LAB — VITAMIN B12: Vitamin B-12: 279 pg/mL (ref 211–911)

## 2011-08-03 MED ORDER — CYANOCOBALAMIN 1000 MCG SL SUBL: 1.0000 | SUBLINGUAL_TABLET | SUBLINGUAL | Status: DC

## 2011-08-03 NOTE — Progress Notes (Signed)
Addended by: Deborra Medina on: 08/03/2011 11:27 AM   Modules accepted: Orders

## 2011-08-12 ENCOUNTER — Ambulatory Visit
Admission: RE | Admit: 2011-08-12 | Discharge: 2011-08-12 | Disposition: A | Discharge status: Home / Self Care | Payer: Medicare Other | Source: Ambulatory Visit | Attending: Neurosurgery | Admitting: Neurosurgery

## 2011-08-12 ENCOUNTER — Other Ambulatory Visit: Payer: Self-pay | Admitting: Neurosurgery

## 2011-08-12 DIAGNOSIS — M545 Low back pain: Secondary | ICD-10-CM

## 2011-09-01 ENCOUNTER — Ambulatory Visit (INDEPENDENT_AMBULATORY_CARE_PROVIDER_SITE_OTHER): Payer: Medicare Other | Admitting: *Deleted

## 2011-09-01 DIAGNOSIS — Z23 Encounter for immunization: Secondary | ICD-10-CM

## 2011-09-24 ENCOUNTER — Telehealth: Payer: Self-pay | Admitting: Internal Medicine

## 2011-09-24 NOTE — Telephone Encounter (Signed)
[phone]  Pt husband called Andrea Stewart has been coughing all night.congestion.ear ache.fever.hurting in chest Tightness

## 2011-09-24 NOTE — Telephone Encounter (Signed)
Notified patient that Dr. Derrel Nip is not here and Dr. Gilford Rile does not have an available appt.  I advised her to go to Urgent Care, or [facility].  She stated if she does not get any better she will go to one of those options.

## 2011-10-04 ENCOUNTER — Ambulatory Visit (INDEPENDENT_AMBULATORY_CARE_PROVIDER_SITE_OTHER): Payer: Medicare Other | Admitting: Internal Medicine

## 2011-10-04 ENCOUNTER — Encounter: Payer: Self-pay | Admitting: Internal Medicine

## 2011-10-04 VITALS — BP 130/76 | HR 77 | Temp 98.2°F | Resp 16 | Ht 62.0 in | Wt 186.5 lb

## 2011-10-04 DIAGNOSIS — Z87891 Personal history of nicotine dependence: Secondary | ICD-10-CM

## 2011-10-04 DIAGNOSIS — J40 Bronchitis, not specified as acute or chronic: Secondary | ICD-10-CM

## 2011-10-04 MED ORDER — BENZONATATE 200 MG PO CAPS: 200.0000 mg | ORAL_CAPSULE | Freq: Three times a day (TID) | ORAL | Status: AC | PRN

## 2011-10-04 MED ORDER — HYDROCODONE-ACETAMINOPHEN 5-500 MG PO TABS: 1.0000 | ORAL_TABLET | Freq: Every evening | ORAL | Status: AC | PRN

## 2011-10-04 NOTE — Patient Instructions (Signed)
Your persistent cough is due to bronchitis.  You do not need any more antibiotics but you do need to control the cough.  Use the tessalon every 6 to 8 hours during the day , and the vicodin at night for the cough.

## 2011-10-04 NOTE — Progress Notes (Signed)
Subjective:    Patient ID: Andrea Stewart, female    DOB: 10/03/1946, 65 y.o.   MRN: [id number]  HPI  65 yo white female with history of tobacco abuse, DM, presents with 2 week history of cough, malaise and low grade fevers with started with a dry hacking cough 2 weeks ago., accompaeind by body aches .  Took husband's augmentin for 7 days has been using tessalon during the day but having stress incontinecen at night becase of the cough .  Fevers resolved.  Past Medical History  Diagnosis Date  . Myalgia and myositis, unspecified   . Plantar fascial fibromatosis   . Personal history of tobacco use, presenting hazards to health     1/2 ppd  . Degeneration of intervertebral disc, site unspecified   . Backache, unspecified     s/p c-spine and l-spine fusions  . Obesity, unspecified   . Disorder of bone and cartilage, unspecified   . Unspecified hereditary and idiopathic peripheral neuropathy     secondary to diabetes  . Unspecified essential hypertension   . Type II or unspecified type diabetes mellitus without mention of complication, not stated as uncontrolled   . Depressive disorder, not elsewhere classified   . GERD (gastroesophageal reflux disease)   . S/p nephrectomy   . S/P cholecystectomy   . Hx of left heart catheterization by cutdown     7 years ago; no significant cad    Current Outpatient Prescriptions on File Prior to Visit  Medication Sig Dispense Refill  . acetaminophen (TYLENOL) 650 MG CR tablet Take 1,300 mg by mouth daily as needed.        Marland Kitchen albuterol (PROVENTIL HFA) 108 (90 BASE) MCG/ACT inhaler Inhale 2 puffs into the lungs every 4 (four) hours as needed.        . ALPRAZolam (XANAX) 0.25 MG tablet Take 0.25 mg by mouth at bedtime as needed.        Marland Kitchen aspirin 325 MG tablet Take 325 mg by mouth daily.        . calcium carbonate (OS-CAL) 600 MG TABS Take 600 mg by mouth daily.        . Cyanocobalamin 1000 MCG SUBL Place 1 tablet (1,000 mcg total) under the tongue 3  (three) times a week.  30 tablet  3  . DAILY MULTIPLE VITAMINS PO Take 1 tablet by mouth daily.        . fluticasone (VERAMYST) 27.5 MCG/SPRAY nasal spray Place 2 sprays into the nose as directed.        . Fluticasone-Salmeterol (ADVAIR DISKUS) 250-50 MCG/DOSE AEPB Inhale 1 puff into the lungs as directed.        . insulin lispro (HUMALOG) 100 UNIT/ML injection Inject into the skin as directed.        . metFORMIN (GLUMETZA) 1000 MG (MOD) 24 hr tablet Take 1,000 mg by mouth 2 (two) times daily with a meal.        . mometasone (NASONEX) 50 MCG/ACT nasal spray Place 2 sprays into the nose daily.        . nitroGLYCERIN (NITROSTAT) 0.4 MG SL tablet Place 0.4 mg under the tongue every 5 (five) minutes as needed. Maximum 3 tablets       . omeprazole (PRILOSEC) 40 MG capsule TAKE ONE CAPSULE BY MOUTH EVERY DAY  30 capsule  6  . valsartan-hydrochlorothiazide (DIOVAN-HCT) 320-25 MG per tablet Take 1 tablet by mouth daily.        Marland Kitchen zolpidem (AMBIEN) 10  MG tablet Take 10 mg by mouth at bedtime as needed.          Review of Systems  Constitutional: Negative for fever, chills and unexpected weight change.  HENT: Positive for congestion. Negative for hearing loss, ear pain, nosebleeds, sore throat, facial swelling, rhinorrhea, sneezing, mouth sores, trouble swallowing, neck pain, neck stiffness, voice change, postnasal drip, sinus pressure, tinnitus and ear discharge.   Eyes: Negative for pain, discharge, redness and visual disturbance.  Respiratory: Positive for cough. Negative for chest tightness, shortness of breath, wheezing and stridor.   Cardiovascular: Negative for chest pain, palpitations and leg swelling.  Musculoskeletal: Negative for myalgias and arthralgias.  Skin: Negative for color change and rash.  Neurological: Negative for dizziness, weakness, light-headedness and headaches.  Hematological: Negative for adenopathy.   BP 130/76  Pulse 77  Temp(Src) 98.2 F (36.8 C) (Oral)  Resp 16  Ht  5\' 2"  (1.575 m)  Wt 186 lb 8 oz (84.596 kg)  BMI 34.11 kg/m2  SpO2 98%     Objective:   Physical Exam  Constitutional: She is oriented to person, place, and time. She appears well-developed and well-nourished.  HENT:  Mouth/Throat: Oropharynx is clear and moist.  Eyes: EOM are normal. Pupils are equal, round, and reactive to light. No scleral icterus.  Neck: Normal range of motion. Neck supple. No JVD present. No thyromegaly present.  Cardiovascular: Normal rate, regular rhythm, normal heart sounds and intact distal pulses.   Pulmonary/Chest: Effort normal and breath sounds normal.  Abdominal: Soft. Bowel sounds are normal. She exhibits no mass. There is no tenderness.  Musculoskeletal: Normal range of motion. She exhibits no edema.  Lymphadenopathy:    She has no cervical adenopathy.  Neurological: She is alert and oriented to person, place, and time.  Skin: Skin is warm and dry.  Psychiatric: She has a normal mood and affect.          Assessment & Plan:  Respiratory infection:  Infectious systemic symptoms have esolved with one week of augmentin self prescribed.  Persistent dry cough problematic,  Will treat with tessalon and hydrocodone

## 2011-10-04 NOTE — Assessment & Plan Note (Signed)
Patient quit smoking a week ago with her husband,  She has gained a few lbs but is resolved to remain off cigarettes bc of her husband's commitment/

## 2011-12-07 ENCOUNTER — Other Ambulatory Visit: Payer: Self-pay | Admitting: *Deleted

## 2011-12-07 DIAGNOSIS — M5126 Other intervertebral disc displacement, lumbar region: Secondary | ICD-10-CM | POA: Diagnosis not present

## 2011-12-07 MED ORDER — VALSARTAN-HYDROCHLOROTHIAZIDE 320-25 MG PO TABS: 1.0000 | ORAL_TABLET | Freq: Every day | ORAL | Status: DC

## 2011-12-11 LAB — HM COLONOSCOPY

## 2011-12-13 DIAGNOSIS — M722 Plantar fascial fibromatosis: Secondary | ICD-10-CM | POA: Diagnosis not present

## 2011-12-14 DIAGNOSIS — M722 Plantar fascial fibromatosis: Secondary | ICD-10-CM | POA: Diagnosis not present

## 2011-12-17 DIAGNOSIS — M722 Plantar fascial fibromatosis: Secondary | ICD-10-CM | POA: Diagnosis not present

## 2011-12-20 DIAGNOSIS — M722 Plantar fascial fibromatosis: Secondary | ICD-10-CM | POA: Diagnosis not present

## 2011-12-28 DIAGNOSIS — M722 Plantar fascial fibromatosis: Secondary | ICD-10-CM | POA: Diagnosis not present

## 2011-12-30 DIAGNOSIS — M722 Plantar fascial fibromatosis: Secondary | ICD-10-CM | POA: Diagnosis not present

## 2012-01-04 DIAGNOSIS — M722 Plantar fascial fibromatosis: Secondary | ICD-10-CM | POA: Diagnosis not present

## 2012-01-06 DIAGNOSIS — M722 Plantar fascial fibromatosis: Secondary | ICD-10-CM | POA: Diagnosis not present

## 2012-01-11 DIAGNOSIS — M722 Plantar fascial fibromatosis: Secondary | ICD-10-CM | POA: Diagnosis not present

## 2012-01-12 DIAGNOSIS — M25519 Pain in unspecified shoulder: Secondary | ICD-10-CM | POA: Diagnosis not present

## 2012-01-13 DIAGNOSIS — M722 Plantar fascial fibromatosis: Secondary | ICD-10-CM | POA: Diagnosis not present

## 2012-01-25 ENCOUNTER — Ambulatory Visit (INDEPENDENT_AMBULATORY_CARE_PROVIDER_SITE_OTHER): Payer: Medicare Other | Admitting: Internal Medicine

## 2012-01-25 ENCOUNTER — Encounter: Payer: Self-pay | Admitting: Internal Medicine

## 2012-01-25 VITALS — BP 126/70 | HR 68 | Temp 97.9°F | Resp 16 | Ht 62.0 in | Wt 191.8 lb

## 2012-01-25 DIAGNOSIS — J329 Chronic sinusitis, unspecified: Secondary | ICD-10-CM

## 2012-01-25 DIAGNOSIS — E669 Obesity, unspecified: Secondary | ICD-10-CM | POA: Diagnosis not present

## 2012-01-25 MED ORDER — AMOXICILLIN-POT CLAVULANATE 875-125 MG PO TABS: 1.0000 | ORAL_TABLET | Freq: Two times a day (BID) | ORAL | Status: AC

## 2012-01-25 NOTE — Patient Instructions (Addendum)
2 mucus relief PE  Every  6 hours,  And a  benadryl every 6 hours   Use  Saline nasal spray at least twice daily ,  Then get Simply Saline  And continue using twice daily until June 1  Ok to use ibuprofen 400 mg and tylenol 1000 mg every 8 hours for muscle aches and headaches   Augmentin twice daily for 7 days     ---------------------------------------------------------------------------------------------------------------------------------------  Consider the Low glycemic index diet, eating 6 smaller meals daily.  Here is an example :  7 AM: Low carbohydrate Protein  Shakes (EAS Carb Control  Or Atkins ,  Available everywhere,   In  cases at [employer] )  2.5 carbs  (Add or substitute a toasted sandwhich thin w/ peanut butter)  10 AM: Protein bar by Atkins (snack size,  Chocolate lover's variety at  [employer])    Lunch: sandwich on pita bread or flatbread (Joseph's makes a pita bread and a flat bread , available at [employer] and [employer]; Toufayah makes a low carb flatbread available at [employer] and [employer])   3 PM:  Mid day :  Another protein bar,  Or a  cheese stick, 1/4 cup of almonds, walnuts, pistachios, pecans, peanuts,  Macadamia nuts  6 PM  Dinner:  "mean and green:"  Meat/chicken/fish, salad, and green veggie : use ranch, vinagrette,  Blue cheese, etc  9 PM snack : Breyer's low carb fudgiscle or  ice cream bar (Carb Smart) Weight Watcher's ice cream bar , or another protein shake

## 2012-01-25 NOTE — Progress Notes (Signed)
Subjective:    Patient ID: Andrea Stewart, female    DOB: 05/27/1946, 66 y.o.   MRN: [id number]  HPI 66 yr old white female with uncontrolled DM Type 2,  Hyperlipidemia,  Obesity presents with a  1 week history  of sudden onset of myalgias, sinus pain ,  ear pain lots and lots of sinus draingage.  Using claritin saline,  Nasal spray and mucinex pe with no significant improvement.  2nd issue is continued weight gain, escalating insulin needs and uncontrolled sugars, She has gained 40 lbs in the last 10 to 15 years.  Past Medical History  Diagnosis Date  . Myalgia and myositis, unspecified   . Plantar fascial fibromatosis   . Personal history of tobacco use, presenting hazards to health     1/2 ppd  . Degeneration of intervertebral disc, site unspecified   . Backache, unspecified     s/p c-spine and l-spine fusions  . Obesity, unspecified   . Disorder of bone and cartilage, unspecified   . Unspecified hereditary and idiopathic peripheral neuropathy     secondary to diabetes  . Unspecified essential hypertension   . Type II or unspecified type diabetes mellitus without mention of complication, not stated as uncontrolled   . Depressive disorder, not elsewhere classified   . GERD (gastroesophageal reflux disease)   . S/p nephrectomy   . S/P cholecystectomy   . Hx of left heart catheterization by cutdown     7 years ago; no significant cad   Current Outpatient Prescriptions on File Prior to Visit  Medication Sig Dispense Refill  . acetaminophen (TYLENOL) 650 MG CR tablet Take 1,300 mg by mouth daily as needed.        Marland Kitchen albuterol (PROVENTIL HFA) 108 (90 BASE) MCG/ACT inhaler Inhale 2 puffs into the lungs every 4 (four) hours as needed.        . ALPRAZolam (XANAX) 0.25 MG tablet Take 0.25 mg by mouth at bedtime as needed.        Marland Kitchen aspirin 325 MG tablet Take 325 mg by mouth daily.        . calcium carbonate (OS-CAL) 600 MG TABS Take 600 mg by mouth daily.        . Cyanocobalamin 1000 MCG  SUBL Place 1 tablet (1,000 mcg total) under the tongue 3 (three) times a week.  30 tablet  3  . DAILY MULTIPLE VITAMINS PO Take 1 tablet by mouth daily.        . fluticasone (VERAMYST) 27.5 MCG/SPRAY nasal spray Place 2 sprays into the nose as directed.        . Fluticasone-Salmeterol (ADVAIR DISKUS) 250-50 MCG/DOSE AEPB Inhale 1 puff into the lungs as directed.        . insulin lispro (HUMALOG) 100 UNIT/ML injection Inject into the skin as directed.        . metFORMIN (GLUMETZA) 1000 MG (MOD) 24 hr tablet Take 1,000 mg by mouth 2 (two) times daily with a meal.        . mometasone (NASONEX) 50 MCG/ACT nasal spray Place 2 sprays into the nose daily.        . nitroGLYCERIN (NITROSTAT) 0.4 MG SL tablet Place 0.4 mg under the tongue every 5 (five) minutes as needed. Maximum 3 tablets       . omeprazole (PRILOSEC) 40 MG capsule TAKE ONE CAPSULE BY MOUTH EVERY DAY  30 capsule  6  . valsartan-hydrochlorothiazide (DIOVAN-HCT) 320-25 MG per tablet Take 1 tablet by mouth daily.  30 tablet  5  . zolpidem (AMBIEN) 10 MG tablet Take 10 mg by mouth at bedtime as needed.              Review of Systems  Constitutional: Negative for fever, chills and unexpected weight change.  HENT: Positive for ear pain, congestion, sore throat, rhinorrhea and postnasal drip. Negative for hearing loss, nosebleeds, facial swelling, sneezing, mouth sores, trouble swallowing, neck pain, neck stiffness, voice change, sinus pressure, tinnitus and ear discharge.   Eyes: Negative for pain, discharge, redness and visual disturbance.  Respiratory: Positive for cough. Negative for chest tightness, shortness of breath, wheezing and stridor.   Cardiovascular: Negative for chest pain, palpitations and leg swelling.  Musculoskeletal: Positive for myalgias. Negative for arthralgias.  Skin: Negative for color change and rash.  Neurological: Negative for dizziness, weakness, light-headedness and headaches.  Hematological: Negative for  adenopathy.       Objective:   Physical Exam  Constitutional: She is oriented to person, place, and time. She appears well-developed and well-nourished.  HENT:  Right Ear: A middle ear effusion is present.  Left Ear: A middle ear effusion is present.  Mouth/Throat: Oropharynx is clear and moist.  Eyes: EOM are normal. Pupils are equal, round, and reactive to light. No scleral icterus.  Neck: Normal range of motion. Neck supple. No JVD present. No thyromegaly present.  Cardiovascular: Normal rate, regular rhythm, normal heart sounds and intact distal pulses.   Pulmonary/Chest: Effort normal and breath sounds normal.  Abdominal: Soft. Bowel sounds are normal. She exhibits no mass. There is no tenderness.  Musculoskeletal: Normal range of motion. She exhibits no edema.  Lymphadenopathy:    She has no cervical adenopathy.  Neurological: She is alert and oriented to person, place, and time.  Skin: Skin is warm and dry.  Psychiatric: She has a normal mood and affect.      Assessment & Plan:   OBESITY I have addressed  BMI and recommended a low glycemic index diet utilitzign smaller more frequent meals to aid his metabolism.  I have alse recommended that he start exercisign with a gaol of 30 minutes of aerovic exercise a minimum of 5 days per week.      Updated Medication List Outpatient Encounter Prescriptions as of 01/25/2012  Medication Sig Dispense Refill  . acetaminophen (TYLENOL) 650 MG CR tablet Take 1,300 mg by mouth daily as needed.        Marland Kitchen albuterol (PROVENTIL HFA) 108 (90 BASE) MCG/ACT inhaler Inhale 2 puffs into the lungs every 4 (four) hours as needed.        . ALPRAZolam (XANAX) 0.25 MG tablet Take 0.25 mg by mouth at bedtime as needed.        Marland Kitchen aspirin 325 MG tablet Take 325 mg by mouth daily.        . calcium carbonate (OS-CAL) 600 MG TABS Take 600 mg by mouth daily.        . Cyanocobalamin 1000 MCG SUBL Place 1 tablet (1,000 mcg total) under the tongue 3 (three)  times a week.  30 tablet  3  . DAILY MULTIPLE VITAMINS PO Take 1 tablet by mouth daily.        . fluticasone (VERAMYST) 27.5 MCG/SPRAY nasal spray Place 2 sprays into the nose as directed.        . Fluticasone-Salmeterol (ADVAIR DISKUS) 250-50 MCG/DOSE AEPB Inhale 1 puff into the lungs as directed.        . insulin lispro (HUMALOG) 100 UNIT/ML  injection Inject into the skin as directed.        . metFORMIN (GLUMETZA) 1000 MG (MOD) 24 hr tablet Take 1,000 mg by mouth 2 (two) times daily with a meal.        . mometasone (NASONEX) 50 MCG/ACT nasal spray Place 2 sprays into the nose daily.        . nitroGLYCERIN (NITROSTAT) 0.4 MG SL tablet Place 0.4 mg under the tongue every 5 (five) minutes as needed. Maximum 3 tablets       . omeprazole (PRILOSEC) 40 MG capsule TAKE ONE CAPSULE BY MOUTH EVERY DAY  30 capsule  6  . valsartan-hydrochlorothiazide (DIOVAN-HCT) 320-25 MG per tablet Take 1 tablet by mouth daily.  30 tablet  5  . zolpidem (AMBIEN) 10 MG tablet Take 10 mg by mouth at bedtime as needed.        Marland Kitchen amoxicillin-clavulanate (AUGMENTIN) 875-125 MG per tablet Take 1 tablet by mouth 2 (two) times daily.  14 tablet  0

## 2012-01-25 NOTE — Assessment & Plan Note (Signed)
I have addressed  BMI and recommended a low glycemic index diet utilitzign smaller more frequent meals to aid his metabolism.  I have alse recommended that he start exercisign with a gaol of 30 minutes of aerovic exercise a minimum of 5 days per week.

## 2012-02-01 DIAGNOSIS — G609 Hereditary and idiopathic neuropathy, unspecified: Secondary | ICD-10-CM | POA: Diagnosis not present

## 2012-02-01 DIAGNOSIS — I10 Essential (primary) hypertension: Secondary | ICD-10-CM | POA: Diagnosis not present

## 2012-02-01 DIAGNOSIS — E109 Type 1 diabetes mellitus without complications: Secondary | ICD-10-CM | POA: Diagnosis not present

## 2012-02-01 DIAGNOSIS — R809 Proteinuria, unspecified: Secondary | ICD-10-CM | POA: Diagnosis not present

## 2012-02-07 ENCOUNTER — Other Ambulatory Visit: Payer: Self-pay | Admitting: Internal Medicine

## 2012-02-07 MED ORDER — OMEPRAZOLE 40 MG PO CPDR: 40.0000 mg | DELAYED_RELEASE_CAPSULE | Freq: Every day | ORAL | Status: DC

## 2012-03-07 ENCOUNTER — Other Ambulatory Visit: Payer: Self-pay | Admitting: Internal Medicine

## 2012-03-07 MED ORDER — METFORMIN HCL 1000 MG PO TABS: 1000.0000 mg | ORAL_TABLET | Freq: Two times a day (BID) | ORAL | Status: AC

## 2012-03-22 DIAGNOSIS — M47812 Spondylosis without myelopathy or radiculopathy, cervical region: Secondary | ICD-10-CM | POA: Diagnosis not present

## 2012-03-22 DIAGNOSIS — M5126 Other intervertebral disc displacement, lumbar region: Secondary | ICD-10-CM | POA: Diagnosis not present

## 2012-03-23 DIAGNOSIS — Z800 Family history of malignant neoplasm of digestive organs: Secondary | ICD-10-CM | POA: Diagnosis not present

## 2012-03-23 DIAGNOSIS — E119 Type 2 diabetes mellitus without complications: Secondary | ICD-10-CM | POA: Diagnosis not present

## 2012-04-03 ENCOUNTER — Other Ambulatory Visit: Payer: Self-pay | Admitting: Internal Medicine

## 2012-04-18 ENCOUNTER — Ambulatory Visit: Payer: Self-pay | Admitting: Unknown Physician Specialty

## 2012-04-18 DIAGNOSIS — Z794 Long term (current) use of insulin: Secondary | ICD-10-CM | POA: Diagnosis not present

## 2012-04-18 DIAGNOSIS — E109 Type 1 diabetes mellitus without complications: Secondary | ICD-10-CM | POA: Diagnosis not present

## 2012-04-18 DIAGNOSIS — Z96659 Presence of unspecified artificial knee joint: Secondary | ICD-10-CM | POA: Diagnosis not present

## 2012-04-18 DIAGNOSIS — K648 Other hemorrhoids: Secondary | ICD-10-CM | POA: Diagnosis not present

## 2012-04-18 DIAGNOSIS — Z8711 Personal history of peptic ulcer disease: Secondary | ICD-10-CM | POA: Diagnosis not present

## 2012-04-18 DIAGNOSIS — Z8042 Family history of malignant neoplasm of prostate: Secondary | ICD-10-CM | POA: Diagnosis not present

## 2012-04-18 DIAGNOSIS — Z1211 Encounter for screening for malignant neoplasm of colon: Secondary | ICD-10-CM | POA: Diagnosis not present

## 2012-04-18 DIAGNOSIS — Z905 Acquired absence of kidney: Secondary | ICD-10-CM | POA: Diagnosis not present

## 2012-04-18 DIAGNOSIS — Z87891 Personal history of nicotine dependence: Secondary | ICD-10-CM | POA: Diagnosis not present

## 2012-04-18 DIAGNOSIS — I10 Essential (primary) hypertension: Secondary | ICD-10-CM | POA: Diagnosis not present

## 2012-04-18 DIAGNOSIS — K2970 Gastritis, unspecified, without bleeding: Secondary | ICD-10-CM | POA: Diagnosis not present

## 2012-04-18 DIAGNOSIS — Z85038 Personal history of other malignant neoplasm of large intestine: Secondary | ICD-10-CM | POA: Diagnosis not present

## 2012-04-18 DIAGNOSIS — E785 Hyperlipidemia, unspecified: Secondary | ICD-10-CM | POA: Diagnosis not present

## 2012-04-18 DIAGNOSIS — Z800 Family history of malignant neoplasm of digestive organs: Secondary | ICD-10-CM | POA: Diagnosis not present

## 2012-04-18 DIAGNOSIS — Z79899 Other long term (current) drug therapy: Secondary | ICD-10-CM | POA: Diagnosis not present

## 2012-05-24 DIAGNOSIS — M659 Synovitis and tenosynovitis, unspecified: Secondary | ICD-10-CM | POA: Diagnosis not present

## 2012-05-24 DIAGNOSIS — M2020 Hallux rigidus, unspecified foot: Secondary | ICD-10-CM | POA: Diagnosis not present

## 2012-06-22 DIAGNOSIS — M5126 Other intervertebral disc displacement, lumbar region: Secondary | ICD-10-CM | POA: Diagnosis not present

## 2012-06-22 DIAGNOSIS — M47812 Spondylosis without myelopathy or radiculopathy, cervical region: Secondary | ICD-10-CM | POA: Diagnosis not present

## 2012-07-03 DIAGNOSIS — M659 Synovitis and tenosynovitis, unspecified: Secondary | ICD-10-CM | POA: Diagnosis not present

## 2012-07-03 DIAGNOSIS — M19079 Primary osteoarthritis, unspecified ankle and foot: Secondary | ICD-10-CM | POA: Diagnosis not present

## 2012-07-26 ENCOUNTER — Encounter: Payer: Self-pay | Admitting: Internal Medicine

## 2012-08-07 ENCOUNTER — Encounter: Payer: Self-pay | Admitting: Internal Medicine

## 2012-08-08 DIAGNOSIS — E109 Type 1 diabetes mellitus without complications: Secondary | ICD-10-CM | POA: Diagnosis not present

## 2012-08-08 DIAGNOSIS — R809 Proteinuria, unspecified: Secondary | ICD-10-CM | POA: Diagnosis not present

## 2012-08-08 DIAGNOSIS — I10 Essential (primary) hypertension: Secondary | ICD-10-CM | POA: Diagnosis not present

## 2012-08-08 DIAGNOSIS — G609 Hereditary and idiopathic neuropathy, unspecified: Secondary | ICD-10-CM | POA: Diagnosis not present

## 2012-08-08 DIAGNOSIS — Z23 Encounter for immunization: Secondary | ICD-10-CM | POA: Diagnosis not present

## 2012-08-16 ENCOUNTER — Encounter: Payer: Self-pay | Admitting: Cardiovascular Disease

## 2012-08-16 ENCOUNTER — Ambulatory Visit (INDEPENDENT_AMBULATORY_CARE_PROVIDER_SITE_OTHER): Payer: Medicare Other | Admitting: Cardiovascular Disease

## 2012-08-16 VITALS — BP 130/80 | HR 83 | Ht 62.0 in | Wt 198.5 lb

## 2012-08-16 DIAGNOSIS — F329 Major depressive disorder, single episode, unspecified: Secondary | ICD-10-CM

## 2012-08-16 DIAGNOSIS — E119 Type 2 diabetes mellitus without complications: Secondary | ICD-10-CM | POA: Diagnosis not present

## 2012-08-16 DIAGNOSIS — I10 Essential (primary) hypertension: Secondary | ICD-10-CM | POA: Diagnosis not present

## 2012-08-16 DIAGNOSIS — F3289 Other specified depressive episodes: Secondary | ICD-10-CM

## 2012-08-16 DIAGNOSIS — I2510 Atherosclerotic heart disease of native coronary artery without angina pectoris: Secondary | ICD-10-CM | POA: Diagnosis not present

## 2012-08-16 DIAGNOSIS — E669 Obesity, unspecified: Secondary | ICD-10-CM

## 2012-08-16 NOTE — Assessment & Plan Note (Deleted)
We have given her a dietary guide and counseled her on foods to avoid.

## 2012-08-16 NOTE — Assessment & Plan Note (Signed)
She will have to lose weight by diet as she is unable to exercise on a regular basis. She has severe arthritis.

## 2012-08-16 NOTE — Assessment & Plan Note (Addendum)
We have encouraged continued exercise, careful diet management in an effort to lose weight. We have given her a dietary guide and counseled her on foods to avoid.

## 2012-08-16 NOTE — Assessment & Plan Note (Signed)
Blood pressure is well controlled on today's visit. No changes made to the medications. 

## 2012-08-16 NOTE — Assessment & Plan Note (Signed)
No significant CAD on prior cardiac catheterization. Risk factors include diabetes. She has repeat cholesterol planned later this year and in the past this has not been an issue. Prior smoking history.

## 2012-08-16 NOTE — Patient Instructions (Addendum)
You are doing well. No medication changes were made.  Please call us if you have new issues that need to be addressed before your next appt.  Your physician wants you to follow-up in: 12 months.  You will receive a reminder letter in the mail two months in advance. If you don't receive a letter, please call our office to schedule the follow-up appointment. 

## 2012-08-16 NOTE — Progress Notes (Signed)
Patient ID: Andrea Stewart, female    DOB: 1946-07-26, 66 y.o.   MRN: [id number]  HPI Comments: Andrea Stewart is a very pleasant 66 year old woman with history of portal controlled diabetes, prior smoking history , patient of   Dr. Derrel Nip, endocrine with Dr. Soyla Murphy, who presents for cardiac evaluation. 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  She reports having a cardiac catheterization more than 7 years ago that showed no significant CAD. She was previously seen by Dr. Olevia Perches. She denies any significant chest pain or shortness of breath. She has never had a problem with cholesterol with measurements of 43 in 2012. 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 She has had problems with spikes in her sugar level after receiving cortisone shots. She continues to have pain in her hands. She states that she has very labile sugars that are difficult to control despite her best efforts at diet control.  EKG shows normal sinus rhythm with rate 79 beats per minute, frequent PVCs   Outpatient Encounter Prescriptions as of 08/16/2012  Medication Sig Dispense Refill  . albuterol (PROVENTIL HFA) 108 (90 BASE) MCG/ACT inhaler Inhale 2 puffs into the lungs every 4 (four) hours as needed.        . ALPRAZolam (XANAX) 0.25 MG tablet Take 0.25 mg by mouth at bedtime as needed.        Marland Kitchen aspirin 325 MG tablet Take 325 mg by mouth daily.        . calcium carbonate (OS-CAL) 600 MG TABS Take 600 mg by mouth daily.        . Cyanocobalamin 1000 MCG SUBL Place 1 tablet (1,000 mcg total) under the tongue 3 (three) times a week.  30 tablet  3  . DAILY MULTIPLE VITAMINS PO Take 1 tablet by mouth daily.        . fluticasone (VERAMYST) 27.5 MCG/SPRAY nasal spray Place 2 sprays into the nose as needed.       . Fluticasone-Salmeterol (ADVAIR DISKUS) 250-50 MCG/DOSE AEPB Inhale 1 puff into the lungs as directed.        . insulin lispro  (HUMALOG) 100 UNIT/ML injection Inject into the skin as directed.        . insulin NPH (HUMULIN N,NOVOLIN N) 100 UNIT/ML injection Inject 19 Units into the skin 2 (two) times daily.      . metFORMIN (GLUCOPHAGE) 1000 MG tablet Take 1 tablet (1,000 mg total) by mouth 2 (two) times daily with a meal.  180 tablet  3  . mometasone (NASONEX) 50 MCG/ACT nasal spray Place 2 sprays into the nose daily.        . nitroGLYCERIN (NITROSTAT) 0.4 MG SL tablet Place 0.4 mg under the tongue every 5 (five) minutes as needed. Maximum 3 tablets       . omeprazole (PRILOSEC) 40 MG capsule Take 1 capsule (40 mg total) by mouth daily.  30 capsule  6  . valsartan-hydrochlorothiazide (DIOVAN-HCT) 320-25 MG per tablet Take 1 tablet by mouth daily.  30 tablet  5  . zolpidem (AMBIEN) 10 MG tablet Take 10 mg by mouth at bedtime as needed.        Marland Kitchen DISCONTD: acetaminophen (TYLENOL) 650 MG CR tablet Take 1,300 mg by mouth daily as needed.           Review of Systems  Constitutional: Negative.   HENT: Negative.   Eyes: Negative.  Respiratory: Negative.   Cardiovascular: Negative.   Gastrointestinal: Negative.   Musculoskeletal: Positive for back pain, joint swelling, arthralgias and gait problem.  Skin: Negative.   Neurological: Negative.   Hematological: Negative.   Psychiatric/Behavioral: Negative.   All other systems reviewed and are negative.    BP 130/80  Pulse 83  Ht 5\' 2"  (1.575 m)  Wt 198 lb 8 oz (90.039 kg)  BMI 36.31 kg/m2  Physical Exam  Nursing note and vitals reviewed. Constitutional: She is oriented to person, place, and time. She appears well-developed and well-nourished.  HENT:  Head: Normocephalic.  Nose: Nose normal.  Mouth/Throat: Oropharynx is clear and moist.  Eyes: Conjunctivae normal are normal. Pupils are equal, round, and reactive to light.  Neck: Normal range of motion. Neck supple. No JVD present.  Cardiovascular: Normal rate, regular rhythm, S1 normal, S2 normal, normal heart  sounds and intact distal pulses.  Exam reveals no gallop and no friction rub.   No murmur heard. Pulmonary/Chest: Effort normal and breath sounds normal. No respiratory distress. She has no wheezes. She has no rales. She exhibits no tenderness.  Abdominal: Soft. Bowel sounds are normal. She exhibits no distension. There is no tenderness.  Musculoskeletal: Normal range of motion. She exhibits no edema and no tenderness.  Lymphadenopathy:    She has no cervical adenopathy.  Neurological: She is alert and oriented to person, place, and time. Coordination normal.  Skin: Skin is warm and dry. No rash noted. No erythema.  Psychiatric: She has a normal mood and affect. Her behavior is normal. Judgment and thought content normal.         Assessment and Plan

## 2012-08-28 ENCOUNTER — Telehealth: Payer: Self-pay | Admitting: Internal Medicine

## 2012-08-28 DIAGNOSIS — Z1239 Encounter for other screening for malignant neoplasm of breast: Secondary | ICD-10-CM

## 2012-08-28 NOTE — Telephone Encounter (Signed)
Pt is needing an order put in for a mammo. Pt is confused and is not sure if it should be put in as a Diagnostic mammo because they last one she had they found a gray area in her mammo and they are needing to do another one.

## 2012-08-29 NOTE — Telephone Encounter (Signed)
See patient's message from 10/14 regarding mammogram order .(Diagnostic versus screening)  The last mammogram I have scanned into her chart is from September 2012 and it states that she should resume regular annual followup in March. If this is her first annual followup in this would be a screening if she had something in between and this is a six-month followup and it would be diagnostic please confirm with patient before ordering

## 2012-09-06 ENCOUNTER — Other Ambulatory Visit: Payer: Self-pay | Admitting: Internal Medicine

## 2012-09-07 ENCOUNTER — Encounter: Payer: Self-pay | Admitting: Internal Medicine

## 2012-09-07 ENCOUNTER — Ambulatory Visit (INDEPENDENT_AMBULATORY_CARE_PROVIDER_SITE_OTHER): Payer: Medicare Other | Admitting: Internal Medicine

## 2012-09-07 VITALS — BP 142/84 | HR 79 | Temp 98.3°F | Ht 60.5 in | Wt 201.2 lb

## 2012-09-07 DIAGNOSIS — R5381 Other malaise: Secondary | ICD-10-CM | POA: Diagnosis not present

## 2012-09-07 DIAGNOSIS — M949 Disorder of cartilage, unspecified: Secondary | ICD-10-CM

## 2012-09-07 DIAGNOSIS — E669 Obesity, unspecified: Secondary | ICD-10-CM | POA: Diagnosis not present

## 2012-09-07 DIAGNOSIS — M549 Dorsalgia, unspecified: Secondary | ICD-10-CM

## 2012-09-07 DIAGNOSIS — I10 Essential (primary) hypertension: Secondary | ICD-10-CM | POA: Diagnosis not present

## 2012-09-07 DIAGNOSIS — K589 Irritable bowel syndrome without diarrhea: Secondary | ICD-10-CM

## 2012-09-07 DIAGNOSIS — Z0000 Encounter for general adult medical examination without abnormal findings: Secondary | ICD-10-CM | POA: Diagnosis not present

## 2012-09-07 DIAGNOSIS — Z124 Encounter for screening for malignant neoplasm of cervix: Secondary | ICD-10-CM | POA: Diagnosis not present

## 2012-09-07 DIAGNOSIS — R5383 Other fatigue: Secondary | ICD-10-CM | POA: Diagnosis not present

## 2012-09-07 DIAGNOSIS — E119 Type 2 diabetes mellitus without complications: Secondary | ICD-10-CM

## 2012-09-07 DIAGNOSIS — Z1239 Encounter for other screening for malignant neoplasm of breast: Secondary | ICD-10-CM

## 2012-09-07 DIAGNOSIS — M8580 Other specified disorders of bone density and structure, unspecified site: Secondary | ICD-10-CM

## 2012-09-07 DIAGNOSIS — M899 Disorder of bone, unspecified: Secondary | ICD-10-CM

## 2012-09-07 LAB — CBC WITH DIFFERENTIAL/PLATELET
Eosinophils Relative: 5.4 % — ABNORMAL HIGH (ref 0.0–5.0)
HCT: 36 % (ref 36.0–46.0)
Lymphocytes Relative: 31.3 % (ref 12.0–46.0)
Lymphs Abs: 1.8 10*3/uL (ref 0.7–4.0)
Monocytes Relative: 9 % (ref 3.0–12.0)
Neutrophils Relative %: 53.7 % (ref 43.0–77.0)
Platelets: 217 10*3/uL (ref 150.0–400.0)
WBC: 5.7 10*3/uL (ref 4.5–10.5)

## 2012-09-07 MED ORDER — TRAMADOL HCL 50 MG PO TABS: 50.0000 mg | ORAL_TABLET | Freq: Three times a day (TID) | ORAL | Status: DC | PRN

## 2012-09-07 MED ORDER — DICYCLOMINE HCL 10 MG PO CAPS: 10.0000 mg | ORAL_CAPSULE | Freq: Three times a day (TID) | ORAL | Status: DC

## 2012-09-07 NOTE — Patient Instructions (Addendum)
Dicyclomine is for irritable bowel.  Take 15 to 30 minutes before meals to prevent cramping/diarrhea

## 2012-09-07 NOTE — Progress Notes (Signed)
Patient ID: Andrea Stewart, female   DOB: Apr 07, 1946, 66 y.o.   MRN: [id number]  Subjective:     Andrea Stewart is a 66 y.o. female and is here for a comprehensive physical exam. The patient reports recurrent diarrhea and cramping, post prandial, secondary to IBS.  History   Social History  . Marital Status: Married    Spouse Name: N/A    Number of Children: 4  . Years of Education: N/A   Occupational History  . Not on file.   Social History Main Topics  . Smoking status: Former Smoker -- 1.0 packs/day    Types: Cigarettes    Quit date: 08/31/2011  . Smokeless tobacco: Never Used  . Alcohol Use: No  . Drug Use: No  . Sexually Active: Not on file   Other Topics Concern  . Not on file   Social History Narrative   Lives in [place]. Disability because of back   Health Maintenance  Topic Date Due  . Zostavax  05/06/2005  . Pneumococcal Polysaccharide Vaccine Age 52 And Over  05/06/2010  . Tetanus/tdap  11/15/2010  . Influenza Vaccine  07/16/2012  . Mammogram  07/25/2013  . Colonoscopy  04/18/2022    The following portions of the patient's history were reviewed and updated as appropriate: allergies, current medications, past family history, past medical history, past social history, past surgical history and problem list.  Review of Systems A comprehensive review of systems was negative except for: Gastrointestinal: positive for diarrhea   Objective:        Assessment:    Healthy female exam. BP 142/84  Pulse 79  Temp 98.3 F (36.8 C) (Oral)  Ht 5' 0.5" (1.537 m)  Wt 201 lb 4 oz (91.286 kg)  BMI 38.66 kg/m2  SpO2 94%  General Appearance:    Alert, cooperative, no distress, appears stated age  Head:    Normocephalic, without obvious abnormality, atraumatic  Eyes:    PERRL, conjunctiva/corneas clear, EOM's intact, fundi    benign, both eyes  Ears:    Normal TM's and external ear canals, both ears  Nose:   Nares normal, septum midline, mucosa normal, no drainage      or sinus tenderness  Throat:   Lips, mucosa, and tongue normal; teeth and gums normal  Neck:   Supple, symmetrical, trachea midline, no adenopathy;    thyroid:  no enlargement/tenderness/nodules; no carotid   bruit or JVD  Back:     Symmetric, no curvature, ROM normal, no CVA tenderness  Lungs:     Clear to auscultation bilaterally, respirations unlabored  Chest Wall:    No tenderness or deformity   Heart:    Regular rate and rhythm, S1 and S2 normal, no murmur, rub   or gallop  Breast Exam:    No tenderness, masses, or nipple abnormality  Abdomen:     Soft, non-tender, bowel sounds active all four quadrants,    no masses, no organomegaly  Genitalia:    Normal female without lesion, discharge or tenderness uterus absent.   Rectal:    Normal tone, no masses or tenderness;   guaiac negative stool  Extremities:   Extremities normal, atraumatic, no cyanosis or edema  Pulses:   2+ and symmetric all extremities  Skin:   Skin color, texture, turgor normal, no rashes or lesions  Lymph nodes:   Cervical, supraclavicular, and axillary nodes normal  Neurologic:   CNII-XII intact, normal strength, sensation and reflexes    throughout  Plan:   HYPERTENSION Well controlled on current medications.  No changes today.  Irritable bowel syndrome chronic post prandial cramping and diarrhea, colonoscopy January 2013. Trial of Bentyl.  DIABETES MELLITUS, TYPE II Uncontrolled, managed by Dr. Bubba Camp.  Last A1c was 9.2 in September.  BACK PAIN, CHRONIC Secondary to degenerative disc disease with prior surgeries by Glenna Fellows. sHe is on disability for back pain.  She requested to be excused medically from serving on jury duty several months ago but I did not authorize this since she walks without a walker and is quite active despite her   chronic pain.  OSTEOPENIA She has not had a DEXA scan over 3 years. I have ordered this today.   Updated Medication List Outpatient Encounter Prescriptions  as of 09/07/2012  Medication Sig Dispense Refill  . albuterol (PROVENTIL HFA) 108 (90 BASE) MCG/ACT inhaler Inhale 2 puffs into the lungs every 4 (four) hours as needed.        . ALPRAZolam (XANAX) 0.25 MG tablet Take 0.25 mg by mouth at bedtime as needed.        Marland Kitchen aspirin 325 MG tablet Take 325 mg by mouth daily.        . calcium carbonate (OS-CAL) 600 MG TABS Take 600 mg by mouth daily.        . Cyanocobalamin 1000 MCG SUBL Place 1 tablet (1,000 mcg total) under the tongue 3 (three) times a week.  30 tablet  3  . DAILY MULTIPLE VITAMINS PO Take 1 tablet by mouth daily.        . fluticasone (VERAMYST) 27.5 MCG/SPRAY nasal spray Place 2 sprays into the nose as needed.       . Fluticasone-Salmeterol (ADVAIR DISKUS) 250-50 MCG/DOSE AEPB Inhale 1 puff into the lungs as directed.        . insulin lispro (HUMALOG) 100 UNIT/ML injection Inject into the skin as directed.        . insulin NPH (HUMULIN N,NOVOLIN N) 100 UNIT/ML injection Inject 19 Units into the skin 2 (two) times daily.      . metFORMIN (GLUCOPHAGE-XR) 500 MG 24 hr tablet Take 500 mg by mouth daily with breakfast.      . mometasone (NASONEX) 50 MCG/ACT nasal spray Place 2 sprays into the nose daily.        . nitroGLYCERIN (NITROSTAT) 0.4 MG SL tablet Place 0.4 mg under the tongue every 5 (five) minutes as needed. Maximum 3 tablets       . omeprazole (PRILOSEC) 40 MG capsule Take 1 capsule (40 mg total) by mouth daily.  30 capsule  6  . valsartan-hydrochlorothiazide (DIOVAN-HCT) 320-25 MG per tablet Take 1 tablet by mouth daily.  30 tablet  5  . zolpidem (AMBIEN) 10 MG tablet Take 10 mg by mouth at bedtime as needed.        . dicyclomine (BENTYL) 10 MG capsule Take 1 capsule (10 mg total) by mouth 4 (four) times daily -  before meals and at bedtime.  90 capsule  1  . metFORMIN (GLUCOPHAGE) 1000 MG tablet Take 1 tablet (1,000 mg total) by mouth 2 (two) times daily with a meal.  180 tablet  3  . traMADol (ULTRAM) 50 MG tablet Take 1 tablet  (50 mg total) by mouth every 8 (eight) hours as needed for pain.  90 tablet  1

## 2012-09-09 ENCOUNTER — Encounter: Payer: Self-pay | Admitting: Internal Medicine

## 2012-09-09 DIAGNOSIS — K589 Irritable bowel syndrome without diarrhea: Secondary | ICD-10-CM | POA: Insufficient documentation

## 2012-09-09 NOTE — Assessment & Plan Note (Signed)
chronic post prandial cramping and diarrhea, colonoscopy January 2013. Trial of Bentyl.

## 2012-09-09 NOTE — Assessment & Plan Note (Signed)
Well controlled on current medications.  No changes today. 

## 2012-09-09 NOTE — Assessment & Plan Note (Signed)
Secondary to degenerative disc disease with prior surgeries by Glenna Fellows. sHe is on disability for back pain.  She requested to be excused medically from serving on jury duty several months ago but I did not authorize this since she walks without a walker and is quite active despite her   chronic pain.

## 2012-09-09 NOTE — Assessment & Plan Note (Signed)
Uncontrolled, managed by Dr. Bubba Camp.  Last A1c was 9.2 in September.

## 2012-09-09 NOTE — Assessment & Plan Note (Signed)
She has not had a DEXA scan over 3 years. I have ordered this today.

## 2012-09-11 DIAGNOSIS — M6530 Trigger finger, unspecified finger: Secondary | ICD-10-CM | POA: Diagnosis not present

## 2012-09-11 DIAGNOSIS — M19049 Primary osteoarthritis, unspecified hand: Secondary | ICD-10-CM | POA: Diagnosis not present

## 2012-09-11 LAB — TSH: TSH: 1.55 u[IU]/mL (ref 0.35–5.50)

## 2012-09-21 ENCOUNTER — Other Ambulatory Visit: Payer: Self-pay

## 2012-09-21 MED ORDER — OMEPRAZOLE 40 MG PO CPDR: 40.0000 mg | DELAYED_RELEASE_CAPSULE | Freq: Every day | ORAL | Status: DC

## 2012-09-21 NOTE — Telephone Encounter (Signed)
Refill request for Omeprazole 40 mg. # 30 [id number] R sent to [facility] .

## 2012-09-26 ENCOUNTER — Ambulatory Visit: Payer: Self-pay | Admitting: Internal Medicine

## 2012-09-26 DIAGNOSIS — Z1231 Encounter for screening mammogram for malignant neoplasm of breast: Secondary | ICD-10-CM | POA: Diagnosis not present

## 2012-10-16 ENCOUNTER — Encounter: Payer: Self-pay | Admitting: Internal Medicine

## 2012-10-18 DIAGNOSIS — M47812 Spondylosis without myelopathy or radiculopathy, cervical region: Secondary | ICD-10-CM | POA: Diagnosis not present

## 2012-10-18 DIAGNOSIS — M5126 Other intervertebral disc displacement, lumbar region: Secondary | ICD-10-CM | POA: Diagnosis not present

## 2012-10-25 DIAGNOSIS — M19049 Primary osteoarthritis, unspecified hand: Secondary | ICD-10-CM | POA: Diagnosis not present

## 2012-10-25 DIAGNOSIS — M6530 Trigger finger, unspecified finger: Secondary | ICD-10-CM | POA: Diagnosis not present

## 2012-11-01 LAB — LIPID PANEL: LDL Cholesterol: 52 mg/dL

## 2012-12-01 DIAGNOSIS — M19049 Primary osteoarthritis, unspecified hand: Secondary | ICD-10-CM | POA: Diagnosis not present

## 2012-12-01 DIAGNOSIS — M6530 Trigger finger, unspecified finger: Secondary | ICD-10-CM | POA: Diagnosis not present

## 2012-12-05 ENCOUNTER — Ambulatory Visit (INDEPENDENT_AMBULATORY_CARE_PROVIDER_SITE_OTHER): Payer: Medicare Other | Admitting: Internal Medicine

## 2012-12-05 ENCOUNTER — Ambulatory Visit (INDEPENDENT_AMBULATORY_CARE_PROVIDER_SITE_OTHER)
Admission: RE | Admit: 2012-12-05 | Discharge: 2012-12-05 | Disposition: A | Discharge status: Home / Self Care | Payer: Medicare Other | Source: Ambulatory Visit | Attending: Internal Medicine | Admitting: Internal Medicine

## 2012-12-05 ENCOUNTER — Encounter: Payer: Self-pay | Admitting: Internal Medicine

## 2012-12-05 VITALS — BP 142/78 | HR 84 | Temp 98.0°F | Resp 12 | Ht 62.0 in | Wt 202.4 lb

## 2012-12-05 DIAGNOSIS — M549 Dorsalgia, unspecified: Secondary | ICD-10-CM

## 2012-12-05 DIAGNOSIS — M539 Dorsopathy, unspecified: Secondary | ICD-10-CM | POA: Diagnosis not present

## 2012-12-05 DIAGNOSIS — Z79899 Other long term (current) drug therapy: Secondary | ICD-10-CM

## 2012-12-05 DIAGNOSIS — IMO0002 Reserved for concepts with insufficient information to code with codable children: Secondary | ICD-10-CM

## 2012-12-05 LAB — COMPREHENSIVE METABOLIC PANEL
BUN: 23 mg/dL (ref 6–23)
CO2: 26 mEq/L (ref 19–32)
Calcium: 9.8 mg/dL (ref 8.4–10.5)
Chloride: 99 mEq/L (ref 96–112)
Creatinine, Ser: 1.5 mg/dL — ABNORMAL HIGH (ref 0.4–1.2)
GFR: 38.22 mL/min — ABNORMAL LOW (ref >60.00)
Total Bilirubin: 0.5 mg/dL (ref 0.3–1.2)

## 2012-12-05 LAB — CK: Total CK: 153 U/L (ref 7–177)

## 2012-12-05 LAB — POCT URINALYSIS DIPSTICK
Bilirubin, UA: NEGATIVE
Blood, UA: NEGATIVE
Ketones, UA: NEGATIVE
Nitrite, UA: NEGATIVE
Protein, UA: NEGATIVE
pH, UA: 5.5

## 2012-12-05 MED ORDER — HYDROMORPHONE HCL 2 MG PO TABS: 2.0000 mg | ORAL_TABLET | ORAL | Status: DC | PRN

## 2012-12-05 NOTE — Progress Notes (Signed)
Patient ID: Andrea Stewart, female   DOB: 11-Feb-1946, 68 y.o.   MRN: [id number]  Patient Active Problem List  Diagnosis  . DIABETES MELLITUS, TYPE II  . OBESITY  . DEPRESSION  . PERIPHERAL NEUROPATHY  . HYPERTENSION  . CAD  . DEGENERATIVE DISC DISEASE  . BACK PAIN, CHRONIC  . PLANTAR FASCIITIS  . FIBROMYALGIA  . OSTEOPENIA  . CHEST PAIN UNSPECIFIED  . Irritable bowel syndrome    Subjective:  CC:   Chief Complaint  Patient presents with  . Back Pain    sharp stabbing pain for almost 1 week    HPI:   Andrea Stewart a 67 y.o. female who presents  Past Medical History  Diagnosis Date  . Myalgia and myositis, unspecified   . Plantar fascial fibromatosis   . Personal history of tobacco use, presenting hazards to health     1/2 ppd  . Degeneration of intervertebral disc, site unspecified   . Backache, unspecified     s/p c-spine and l-spine fusions  . Obesity, unspecified   . Disorder of bone and cartilage, unspecified   . Unspecified hereditary and idiopathic peripheral neuropathy     secondary to diabetes  . Unspecified essential hypertension   . Type II or unspecified type diabetes mellitus without mention of complication, not stated as uncontrolled   . Depressive disorder, not elsewhere classified   . GERD (gastroesophageal reflux disease)   . S/p nephrectomy   . S/P cholecystectomy   . Hx of left heart catheterization by cutdown     7 years ago; no significant cad    Past Surgical History  Procedure Date  . Appendectomy   . Carpal tunnel release   . Cholecystectomy   . Vesicovaginal fistula closure w/ tah   . Replacement total knee 10/2004  . Foot surgery     x 3  . Elbow surgery   . Trigger finger surgery   . 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 Cervical discectomy   . Lumbar fusion   . Colonoscopy 09/1999    colonoscopy-hemorrhoids, re-check 5 years (10/2006)  . Cardiac catheterization     o.k. 2003  . Dexa     osteopenia (01/2004)  . Neck surgery      06/2005  . Back surgery     2006  . Problem with general anesthesia     02/2006  . Abdominal hysterectomy          The following portions of the patient's history were reviewed and updated as appropriate: Allergies, current medications, and problem list.    Review of Systems:   12 Pt  review of systems was negative except those addressed in the HPI,     History   Social History  . Marital Status: Married    Spouse Name: N/A    Number of Children: 4  . Years of Education: N/A   Occupational History  . Not on file.   Social History Main Topics  . Smoking status: Former Smoker -- 1.0 packs/day    Types: Cigarettes    Quit date: 08/31/2011  . Smokeless tobacco: Never Used  . Alcohol Use: No  . Drug Use: No  . Sexually Active: Not on file   Other Topics Concern  . Not on file   Social History Narrative   Lives in [place]. Disability because of back    Objective:  BP 142/78  Pulse 84  Temp 98 F (36.7 C) (Oral)  Resp 12  Ht 5'  2" (1.575 m)  Wt 202 lb 6.4 oz (91.808 kg)  BMI 37.02 kg/m2  SpO2 97%  General appearance: alert, cooperative and appears stated age Ears: normal TM's and external ear canals both ears Throat: lips, mucosa, and tongue normal; teeth and gums normal Neck: no adenopathy, no carotid bruit, supple, symmetrical, trachea midline and thyroid not enlarged, symmetric, no tenderness/mass/nodules Back: symmetric, no curvature. ROM normal. No CVA tenderness.  No pain to palpation of paraspinous muscles. Lungs: clear to auscultation bilaterally Heart: regular rate and rhythm, S1, S2 normal, no murmur, click, rub or gallop Abdomen: soft, non-tender; bowel sounds normal; no masses,  no organomegaly Pulses: 2+ and symmetric Skin: Skin color, texture, turgor normal. No rashes or lesions Lymph nodes: Cervical, supraclavicular, and axillary nodes normal.  Assessment and Plan:  DEGENERATIVE DISC DISEASE With acute onset of stabbing pain in  the lateral paraspinsous muscle area.  UA was normal,  Plain films ordered to evaluate prior surgery. Tiral of dilaudid for pain control.    Updated Medication List Outpatient Encounter Prescriptions as of 12/05/2012  Medication Sig Dispense Refill  . albuterol (PROVENTIL HFA) 108 (90 BASE) MCG/ACT inhaler Inhale 2 puffs into the lungs every 4 (four) hours as needed.        . ALPRAZolam (XANAX) 0.25 MG tablet Take 0.25 mg by mouth at bedtime as needed.        Marland Kitchen aspirin 325 MG tablet Take 325 mg by mouth daily.        . calcium carbonate (OS-CAL) 600 MG TABS Take 600 mg by mouth daily.        . Cyanocobalamin 1000 MCG SUBL Place 1 tablet (1,000 mcg total) under the tongue 3 (three) times a week.  30 tablet  3  . DAILY MULTIPLE VITAMINS PO Take 1 tablet by mouth daily.        Marland Kitchen dicyclomine (BENTYL) 10 MG capsule Take 1 capsule (10 mg total) by mouth 4 (four) times daily -  before meals and at bedtime.  90 capsule  1  . fluticasone (VERAMYST) 27.5 MCG/SPRAY nasal spray Place 2 sprays into the nose as needed.       . Fluticasone-Salmeterol (ADVAIR DISKUS) 250-50 MCG/DOSE AEPB Inhale 1 puff into the lungs as directed.        Marland Kitchen HYDROcodone-acetaminophen (NORCO) 10-325 MG per tablet Take 1 tablet by mouth. Every 4 to 6 hours as needed      . insulin lispro (HUMALOG) 100 UNIT/ML injection Inject into the skin as directed.        . insulin NPH (HUMULIN N,NOVOLIN N) 100 UNIT/ML injection Inject 19 Units into the skin 2 (two) times daily.      . metFORMIN (GLUCOPHAGE) 1000 MG tablet Take 1 tablet (1,000 mg total) by mouth 2 (two) times daily with a meal.  180 tablet  3  . methocarbamol (ROBAXIN) 500 MG tablet 1 tablet every 6 to 8 hours as needed      . mometasone (NASONEX) 50 MCG/ACT nasal spray Place 2 sprays into the nose daily.        . nitroGLYCERIN (NITROSTAT) 0.4 MG SL tablet Place 0.4 mg under the tongue every 5 (five) minutes as needed. Maximum 3 tablets       . omeprazole (PRILOSEC) 40 MG capsule  Take 1 capsule (40 mg total) by mouth daily.  30 capsule  6  . traMADol (ULTRAM) 50 MG tablet Take 1 tablet (50 mg total) by mouth every 8 (eight) hours as needed  for pain.  90 tablet  1  . valsartan-hydrochlorothiazide (DIOVAN-HCT) 320-25 MG per tablet Take 1 tablet by mouth daily.  30 tablet  5  . zolpidem (AMBIEN) 10 MG tablet Take 10 mg by mouth at bedtime as needed.        Marland Kitchen HYDROmorphone (DILAUDID) 2 MG tablet Take 1 tablet (2 mg total) by mouth every 4 (four) hours as needed for pain.  90 tablet  0  . [DISCONTINUED] metFORMIN (GLUCOPHAGE-XR) 500 MG 24 hr tablet Take 500 mg by mouth daily with breakfast.         Orders Placed This Encounter  Procedures  . DG Lumbar Spine Complete  . Comprehensive metabolic panel  . Magnesium  . CK  . POCT Urinalysis Dipstick    Return in about 1 week (around 12/12/2012).

## 2012-12-05 NOTE — Patient Instructions (Addendum)
i am prescribing a very strong pain medication to take in place of the vicodin.  It does NOT have tylenol in it  I am ordering plain films of lumbar spine to rule out fracture ,  And UA Reva Bores

## 2012-12-06 ENCOUNTER — Encounter: Payer: Self-pay | Admitting: Internal Medicine

## 2012-12-06 NOTE — Assessment & Plan Note (Addendum)
With acute onset of stabbing pain in the lateral paraspinsous muscle area.  UA was normal,  Plain films ordered to evaluate prior surgery. Tiral of dilaudid for pain control.

## 2012-12-07 ENCOUNTER — Telehealth: Payer: Self-pay | Admitting: Internal Medicine

## 2012-12-07 DIAGNOSIS — M47812 Spondylosis without myelopathy or radiculopathy, cervical region: Secondary | ICD-10-CM | POA: Diagnosis not present

## 2012-12-07 DIAGNOSIS — M5126 Other intervertebral disc displacement, lumbar region: Secondary | ICD-10-CM | POA: Diagnosis not present

## 2012-12-07 NOTE — Telephone Encounter (Signed)
Patient wanting to know her results from her x-ray and blood work. She can't get in her my-chart account.

## 2012-12-12 DIAGNOSIS — I10 Essential (primary) hypertension: Secondary | ICD-10-CM | POA: Diagnosis not present

## 2012-12-12 DIAGNOSIS — G609 Hereditary and idiopathic neuropathy, unspecified: Secondary | ICD-10-CM | POA: Diagnosis not present

## 2012-12-12 DIAGNOSIS — E109 Type 1 diabetes mellitus without complications: Secondary | ICD-10-CM | POA: Diagnosis not present

## 2012-12-12 DIAGNOSIS — R809 Proteinuria, unspecified: Secondary | ICD-10-CM | POA: Diagnosis not present

## 2012-12-29 DIAGNOSIS — Z4789 Encounter for other orthopedic aftercare: Secondary | ICD-10-CM | POA: Diagnosis not present

## 2013-01-12 DIAGNOSIS — Z4789 Encounter for other orthopedic aftercare: Secondary | ICD-10-CM | POA: Diagnosis not present

## 2013-02-05 DIAGNOSIS — Z4789 Encounter for other orthopedic aftercare: Secondary | ICD-10-CM | POA: Diagnosis not present

## 2013-04-12 DIAGNOSIS — E109 Type 1 diabetes mellitus without complications: Secondary | ICD-10-CM | POA: Diagnosis not present

## 2013-04-12 DIAGNOSIS — R809 Proteinuria, unspecified: Secondary | ICD-10-CM | POA: Diagnosis not present

## 2013-04-12 DIAGNOSIS — G609 Hereditary and idiopathic neuropathy, unspecified: Secondary | ICD-10-CM | POA: Diagnosis not present

## 2013-04-12 DIAGNOSIS — I10 Essential (primary) hypertension: Secondary | ICD-10-CM | POA: Diagnosis not present

## 2013-04-24 DIAGNOSIS — M5126 Other intervertebral disc displacement, lumbar region: Secondary | ICD-10-CM | POA: Diagnosis not present

## 2013-04-24 DIAGNOSIS — M47812 Spondylosis without myelopathy or radiculopathy, cervical region: Secondary | ICD-10-CM | POA: Diagnosis not present

## 2013-05-04 ENCOUNTER — Other Ambulatory Visit: Payer: Self-pay | Admitting: Internal Medicine

## 2013-05-29 DIAGNOSIS — E119 Type 2 diabetes mellitus without complications: Secondary | ICD-10-CM | POA: Diagnosis not present

## 2013-06-19 ENCOUNTER — Ambulatory Visit (INDEPENDENT_AMBULATORY_CARE_PROVIDER_SITE_OTHER): Payer: Medicare Other | Admitting: Cardiovascular Disease

## 2013-06-19 ENCOUNTER — Encounter: Payer: Self-pay | Admitting: Cardiovascular Disease

## 2013-06-19 VITALS — BP 150/84 | HR 82 | Ht 62.0 in | Wt 205.5 lb

## 2013-06-19 DIAGNOSIS — R079 Chest pain, unspecified: Secondary | ICD-10-CM

## 2013-06-19 DIAGNOSIS — E669 Obesity, unspecified: Secondary | ICD-10-CM | POA: Diagnosis not present

## 2013-06-19 DIAGNOSIS — I10 Essential (primary) hypertension: Secondary | ICD-10-CM

## 2013-06-19 DIAGNOSIS — R0602 Shortness of breath: Secondary | ICD-10-CM | POA: Diagnosis not present

## 2013-06-19 MED ORDER — NITROGLYCERIN 0.4 MG SL SUBL: 0.4000 mg | SUBLINGUAL_TABLET | SUBLINGUAL | Status: DC | PRN

## 2013-06-19 MED ORDER — OMEPRAZOLE 40 MG PO CPDR: 40.0000 mg | DELAYED_RELEASE_CAPSULE | Freq: Two times a day (BID) | ORAL | Status: DC

## 2013-06-19 NOTE — Assessment & Plan Note (Addendum)
Atypical type chest pain. Seemed to come on at rest. Concerning more for GI etiology. Recommended she increase her proton pump inhibitor, try Gas-X or simethicone, possibly even carbonation for hiatal hernia. He also recommended she decrease her aspirin down to 81 mg daily. If symptoms get worse, further evaluation could be done. Prior cardiac catheterization by her report showing no significant CAD.Marland Kitchen

## 2013-06-19 NOTE — Progress Notes (Signed)
Patient ID: Andrea Stewart, female    DOB: 1946/10/01, 67 y.o.   MRN: [id number]  HPI Comments: [name] is a very pleasant 67 year old woman with history of poorly controlled diabetes, prior smoking history , patient of   Dr. Derrel Nip, endocrine with Dr. Soyla Murphy, who presents for cardiac evaluation. 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  Cardiac catheterization by report more than 7 years ago that showed no significant CAD. She was previously seen by Dr. Olevia Perches.  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   Her biggest complaint today is waxing waning chest discomfort in her lower mediastinal area. No relation to exertion, seems to happen more at rest. Uncertain if worse with eating. No radiation, no change with position. Mild to moderate in intensity described as a pressure or ache. Otherwise she is active and not able to reproduce her symptoms.  She's not been exercising, weight continues to be a problem. She is otherwise active and helps take care of her great grandchild.  EKG shows normal sinus rhythm with rate 82 beats per minute   Outpatient Encounter Prescriptions as of 06/19/2013  Medication Sig Dispense Refill  . albuterol (PROVENTIL HFA) 108 (90 BASE) MCG/ACT inhaler Inhale 2 puffs into the lungs every 4 (four) hours as needed.        Marland Kitchen aspirin 325 MG tablet Take 325 mg by mouth daily.        . calcium carbonate (OS-CAL) 600 MG TABS Take 600 mg by mouth daily.        Marland Kitchen DAILY MULTIPLE VITAMINS PO Take 1 tablet by mouth daily.        . fluticasone (VERAMYST) 27.5 MCG/SPRAY nasal spray Place 2 sprays into the nose as needed.       . Fluticasone-Salmeterol (ADVAIR DISKUS) 250-50 MCG/DOSE AEPB Inhale 1 puff into the lungs as directed.        . insulin lispro (HUMALOG) 100 UNIT/ML injection Inject into the skin as directed.        . insulin NPH (HUMULIN N,NOVOLIN N) 100 UNIT/ML injection Inject  19 Units into the skin daily before breakfast. 21 units at bedtime      . methocarbamol (ROBAXIN) 500 MG tablet 1 tablet every 6 to 8 hours as needed      . mometasone (NASONEX) 50 MCG/ACT nasal spray Place 2 sprays into the nose daily.        . nitroGLYCERIN (NITROSTAT) 0.4 MG SL tablet Place 0.4 mg under the tongue every 5 (five) minutes as needed. Maximum 3 tablets       . omeprazole (PRILOSEC) 40 MG capsule TAKE ONE (1) CAPSULE EACH DAY  30 capsule  5  . traMADol (ULTRAM) 50 MG tablet Take 1 tablet (50 mg total) by mouth every 8 (eight) hours as needed for pain.  90 tablet  1  . valsartan-hydrochlorothiazide (DIOVAN-HCT) 320-25 MG per tablet Take 1 tablet by mouth daily.  30 tablet  5  . zolpidem (AMBIEN) 10 MG tablet Take 10 mg by mouth at bedtime as needed.        . [DISCONTINUED] ALPRAZolam (XANAX) 0.25 MG tablet Take 0.25 mg by mouth at bedtime as needed.        . [DISCONTINUED] Cyanocobalamin 1000 MCG SUBL Place 1 tablet (1,000 mcg total) under the tongue 3 (three) times a week.  30 tablet  3  . [DISCONTINUED] dicyclomine (BENTYL)  10 MG capsule Take 1 capsule (10 mg total) by mouth 4 (four) times daily -  before meals and at bedtime.  90 capsule  1  . [DISCONTINUED] HYDROcodone-acetaminophen (NORCO) 10-325 MG per tablet Take 1 tablet by mouth. Every 4 to 6 hours as needed      . [DISCONTINUED] HYDROmorphone (DILAUDID) 2 MG tablet Take 1 tablet (2 mg total) by mouth every 4 (four) hours as needed for pain.  90 tablet  0   No facility-administered encounter medications on file as of 06/19/2013.     Review of Systems  Constitutional: Negative.   HENT: Negative.   Eyes: Negative.   Respiratory: Positive for chest tightness.   Cardiovascular: Positive for chest pain.  Gastrointestinal: Negative.   Musculoskeletal: Positive for back pain, joint swelling, arthralgias and gait problem.  Skin: Negative.   Neurological: Negative.   Psychiatric/Behavioral: Negative.   All other systems  reviewed and are negative.    BP 150/84  Pulse 82  Ht 5\' 2"  (1.575 m)  Wt 205 lb 8 oz (93.214 kg)  BMI 37.58 kg/m2  Physical Exam  Nursing note and vitals reviewed. Constitutional: She is oriented to person, place, and time. She appears well-developed and well-nourished.  HENT:  Head: Normocephalic.  Nose: Nose normal.  Mouth/Throat: Oropharynx is clear and moist.  Eyes: Conjunctivae are normal. Pupils are equal, round, and reactive to light.  Neck: Normal range of motion. Neck supple. No JVD present.  Cardiovascular: Normal rate, regular rhythm, S1 normal, S2 normal, normal heart sounds and intact distal pulses.  Exam reveals no gallop and no friction rub.   No murmur heard. Pulmonary/Chest: Effort normal and breath sounds normal. No respiratory distress. She has no wheezes. She has no rales. She exhibits no tenderness.  Abdominal: Soft. Bowel sounds are normal. She exhibits no distension. There is no tenderness.  Musculoskeletal: Normal range of motion. She exhibits no edema and no tenderness.  Lymphadenopathy:    She has no cervical adenopathy.  Neurological: She is alert and oriented to person, place, and time. Coordination normal.  Skin: Skin is warm and dry. No rash noted. No erythema.  Psychiatric: She has a normal mood and affect. Her behavior is normal. Judgment and thought content normal.    Assessment and Plan

## 2013-06-19 NOTE — Assessment & Plan Note (Signed)
Currently with no symptoms of angina. No further workup at this time. Continue current medication regimen. 

## 2013-06-19 NOTE — Patient Instructions (Addendum)
  Please decrease the aspirin to 81 mg daily Increase the omeprazole to twice a day Try soda or Gas-x for chest tightness  Please call us if you have new issues that need to be addressed before your next appt.  Your physician wants you to follow-up in: 6 months.  You will receive a reminder letter in the mail two months in advance. If you don't receive a letter, please call our office to schedule the follow-up appointment.

## 2013-06-19 NOTE — Assessment & Plan Note (Signed)
We have encouraged continued exercise, careful diet management in an effort to lose weight. 

## 2013-06-20 ENCOUNTER — Telehealth: Payer: Self-pay | Admitting: *Deleted

## 2013-06-20 NOTE — Telephone Encounter (Signed)
Needing prior authorization for Omeprazole 40 mg BID. [employer] prior authorization form awaiting Dx and Signature from MD.

## 2013-06-25 ENCOUNTER — Telehealth: Payer: Self-pay | Admitting: *Deleted

## 2013-06-25 LAB — HM DIABETES EYE EXAM: HM Diabetic Eye Exam: NORMAL

## 2013-06-25 NOTE — Telephone Encounter (Signed)
Britt Bottom, CMA at 06/20/2013 10:33 AM   Status: Signed            Needing prior authorization for Omeprazole 40 mg BID. [employer] prior authorization form awaiting Dx and Signature from MD    Dr. Rockey Situ would like pharmacy/prior authorization forwarded to pcp for approval.

## 2013-07-01 LAB — HM DIABETES EYE EXAM

## 2013-07-04 DIAGNOSIS — M722 Plantar fascial fibromatosis: Secondary | ICD-10-CM | POA: Diagnosis not present

## 2013-07-10 DIAGNOSIS — R079 Chest pain, unspecified: Secondary | ICD-10-CM | POA: Diagnosis not present

## 2013-07-10 DIAGNOSIS — R1013 Epigastric pain: Secondary | ICD-10-CM | POA: Diagnosis not present

## 2013-07-10 DIAGNOSIS — R0609 Other forms of dyspnea: Secondary | ICD-10-CM | POA: Diagnosis not present

## 2013-07-17 ENCOUNTER — Telehealth: Payer: Self-pay

## 2013-07-17 NOTE — Telephone Encounter (Signed)
Pt states she is still having chest discomfort. Pt states she went to her GI dr and she states Dr Rockey Situ did not want her to have stress test, he advised her to go to ED. Pt  Wants to know why dr Rockey Situ cant order stress test so she can have EDG done. Please advise and call.

## 2013-07-17 NOTE — Telephone Encounter (Signed)
Returned call to patient she stated she recently saw Dr.Gollan for chest tightness and heaviness.Stated he did a ekg and ekg was normal.Stated he thought symptoms were GI related.Stated she saw Dr.Kim Jerelene Redden and she will not do a Endo until she has a stress test first.Message sent to [facility] for advice.

## 2013-07-18 DIAGNOSIS — M722 Plantar fascial fibromatosis: Secondary | ICD-10-CM | POA: Diagnosis not present

## 2013-07-19 ENCOUNTER — Telehealth: Payer: Self-pay | Admitting: *Deleted

## 2013-07-19 DIAGNOSIS — M722 Plantar fascial fibromatosis: Secondary | ICD-10-CM | POA: Diagnosis not present

## 2013-07-19 NOTE — Telephone Encounter (Signed)
Patient needs a endoscopy per Dr. Vira Agar and a stress test is needed for clearance

## 2013-07-20 DIAGNOSIS — D649 Anemia, unspecified: Secondary | ICD-10-CM | POA: Diagnosis not present

## 2013-07-20 NOTE — Telephone Encounter (Signed)
Would order lexiscan myoview if she is still having sx.

## 2013-07-20 NOTE — Telephone Encounter (Signed)
Spoke w/ pt.  She was quite upset and states that she has "been trying all week to get a stress test".   She states that Dr. Percell [place] office "needs a stress test before they will do an endoscopy on her", but was told by their office and Dawson Bills that "Dr. Rockey Situ won't order a stress test for you and wants you to go to the ED to have it done".   She was frustrated to the point that she did not want to come to our office anymore, as "she felt that nobody cared about her" I explained that there was no documentation in our system of any conversation like that, and that I would speak to Dr. Rockey Situ about the type of test that she needed.  Pt states that she cannot do an treadmill test, has had nuclear stress tests in the past and would prefer that.  Her available days are 9/16 & 9/19. Pt would like a call by the end of the day with a date for the test.

## 2013-07-20 NOTE — Telephone Encounter (Signed)
Spoke w/ pt.  Per Dr. Rockey Situ, okay to order [facility] through [facility], but they close at 5:30 in the evening, so I will have to call them back on Monday. Pt aware and will wait to hear from me.

## 2013-07-23 ENCOUNTER — Other Ambulatory Visit: Payer: Self-pay

## 2013-07-23 DIAGNOSIS — R079 Chest pain, unspecified: Secondary | ICD-10-CM

## 2013-07-23 DIAGNOSIS — M722 Plantar fascial fibromatosis: Secondary | ICD-10-CM | POA: Diagnosis not present

## 2013-07-23 NOTE — Telephone Encounter (Signed)
Lexiscan sched for 07/27/13 @ 7:30.  Went over instructions verbally and will mail letter, as well.

## 2013-07-25 ENCOUNTER — Encounter: Payer: Self-pay | Admitting: *Deleted

## 2013-07-25 DIAGNOSIS — M722 Plantar fascial fibromatosis: Secondary | ICD-10-CM | POA: Diagnosis not present

## 2013-07-26 ENCOUNTER — Encounter: Payer: Self-pay | Admitting: Internal Medicine

## 2013-07-26 ENCOUNTER — Ambulatory Visit (INDEPENDENT_AMBULATORY_CARE_PROVIDER_SITE_OTHER): Payer: Medicare Other | Admitting: Internal Medicine

## 2013-07-26 VITALS — BP 138/78 | HR 94 | Temp 98.7°F | Resp 14 | Wt 204.2 lb

## 2013-07-26 DIAGNOSIS — E119 Type 2 diabetes mellitus without complications: Secondary | ICD-10-CM | POA: Diagnosis not present

## 2013-07-26 DIAGNOSIS — E669 Obesity, unspecified: Secondary | ICD-10-CM | POA: Diagnosis not present

## 2013-07-26 DIAGNOSIS — Z23 Encounter for immunization: Secondary | ICD-10-CM

## 2013-07-26 DIAGNOSIS — R079 Chest pain, unspecified: Secondary | ICD-10-CM

## 2013-07-26 LAB — HM DIABETES FOOT EXAM: HM Diabetic Foot Exam: NORMAL

## 2013-07-26 NOTE — Assessment & Plan Note (Signed)
Reviewed weight and diet and activity level.  Exercise is limited by MSK conditions inclugin DDD,  OA and plantar fasciitis.  I have addressed  BMI and recommended wt loss of 10% of body weigh over the next 6 months using a stricter low glycemic index diet and water aerobics for exercise  With goal a minimum of 5 days per week.

## 2013-07-26 NOTE — Assessment & Plan Note (Addendum)
Managed by endocrinologist,.  Records not available. Foot exam was normal today. She is taking an aspirin and an ARB

## 2013-07-26 NOTE — Patient Instructions (Addendum)
Try a robaxin for your chest pain  Try a puff of albuterol    Try the Lennie Hummer  n Fit  [employer] flavor (food [place])

## 2013-07-26 NOTE — Progress Notes (Signed)
Patient ID: Andrea Stewart, female   DOB: 1946/08/15, 67 y.o.   MRN: [id number]   Patient Active Problem List   Diagnosis Date Noted  . Irritable bowel syndrome 09/09/2012  . CHEST PAIN UNSPECIFIED 08/25/2009  . CAD 07/26/2009  . DIABETES MELLITUS, TYPE II 03/09/2007  . OBESITY 03/09/2007  . DEPRESSION 03/09/2007  . PERIPHERAL NEUROPATHY 03/09/2007  . HYPERTENSION 03/09/2007  . DEGENERATIVE DISC DISEASE 03/09/2007  . BACK PAIN, CHRONIC 03/09/2007  . PLANTAR FASCIITIS 03/09/2007  . FIBROMYALGIA 03/09/2007  . OSTEOPENIA 03/09/2007    Subjective:  CC:   Chief Complaint  Patient presents with  . Follow-up    per Dr. Tiffany Kocher office pain in center of chest X 2 months patient saw Dr. Hedy Camara office ecg was fine reported by patient.    HPI:   Andrea Stewart a 67 y.o. female who presents Recurrent chest discomfort ,  Described as pressure, Squeezing/tight,  Aggravated by supine position, but also comes and goes throughout the day as well .  No specific aggravators   Has a stress test scheduled for  in the morning by Dr.  Rockey Situ ,  Normal cardia catheterization 7 years ago but has diabetes.  Has already seen GI and was told she was anemic.Marland Kitchen  Last hgb here was 11.8 in October.   Home FOBT was negative,  Iron studies are pending . Sent by GI without EGD to Cardiology for evaluation.  Gollan increased the omeprazole to bid ,  But since pain has persisted he ordered a stress test is in the morning   She has not tried taking a muscle relaxer or using her inhaler  ,  [name] cannot take NSAIds bc of history of Gastric ulcer.  She Has problems swallowing large pills since her neck surgery but never has problems swallowing food. No history of hiatal hernia. No water brash or burping up battery acid.  No change with pepto bismol .   Obesity :  Has not achieved wt loss yet despite adopting some elements of low  Glycemic index diet.  Ability to exercise limited by joint pain and plantar fasciitis  of left foot.    Past Medical History  Diagnosis Date  . Myalgia and myositis, unspecified   . Plantar fascial fibromatosis   . Personal history of tobacco use, presenting hazards to health     1/2 ppd  . Degeneration of intervertebral disc, site unspecified   . Backache, unspecified     s/p c-spine and l-spine fusions  . Obesity, unspecified   . Disorder of bone and cartilage, unspecified   . Unspecified hereditary and idiopathic peripheral neuropathy     secondary to diabetes  . Unspecified essential hypertension   . Type II or unspecified type diabetes mellitus without mention of complication, not stated as uncontrolled   . Depressive disorder, not elsewhere classified   . GERD (gastroesophageal reflux disease)   . S/p nephrectomy   . S/P cholecystectomy   . Hx of left heart catheterization by cutdown     7 years ago; no significant cad    Past Surgical History  Procedure Laterality Date  . Appendectomy    . Carpal tunnel release    . Cholecystectomy    . Vesicovaginal fistula closure w/ tah    . Replacement total knee  10/2004  . Foot surgery      x 3  . Elbow surgery    . Trigger finger surgery    . 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  Cervical discectomy    . Lumbar fusion    . Colonoscopy  09/1999    colonoscopy-hemorrhoids, re-check 5 years (10/2006)  . Cardiac catheterization      o.k. 2003  . Dexa      osteopenia (01/2004)  . Neck surgery      06/2005  . Back surgery      2006  . Problem with general anesthesia      02/2006  . Abdominal hysterectomy         The following portions of the patient's history were reviewed and updated as appropriate: Allergies, current medications, and problem list.    Review of Systems:   12 Pt  review of systems was negative except those addressed in the HPI,     History   Social History  . Marital Status: Married    Spouse Name: N/A    Number of Children: 4  . Years of Education: N/A   Occupational History  .  Not on file.   Social History Main Topics  . Smoking status: Former Smoker -- 1.00 packs/day    Types: Cigarettes    Quit date: 08/31/2011  . Smokeless tobacco: Never Used  . Alcohol Use: No  . Drug Use: No  . Sexual Activity: Not on file   Other Topics Concern  . Not on file   Social History Narrative   Lives in [place]. Disability because of back    Objective:  Filed Vitals:   07/26/13 0954  BP: 138/78  Pulse: 94  Temp: 98.7 F (37.1 C)  Resp: 14     General appearance: alert, cooperative and appears stated age Ears: normal TM's and external ear canals both ears Throat: lips, mucosa, and tongue normal; teeth and gums normal Neck: no adenopathy, no carotid bruit, supple, symmetrical, trachea midline and thyroid not enlarged, symmetric, no tenderness/mass/nodules Back: symmetric, no curvature. ROM normal. No CVA tenderness. Lungs: clear to auscultation bilaterally Heart: regular rate and rhythm, S1, S2 normal, no murmur, click, rub or gallop Abdomen: soft, non-tender; bowel sounds normal; no masses,  no organomegaly Pulses: 2+ and symmetric Skin: Skin color, texture, turgor normal. No rashes or lesions Lymph nodes: Cervical, supraclavicular, and axillary nodes normal. Foot exam:  Nails are well trimmed,  No callouses,  Sensation intact to microfilament   Assessment and Plan:  CHEST PAIN UNSPECIFIED Atypical,  History of normal cardiac cath 7 years ago,  But with her history of uncontrolled DM I agree that risk stratification is necessary.  Stress test is scheduled for in the morning.  Also need to conisder MSK causes , given history of degenerative disk disease and pulmonary causes.  Advised her to try robaxin and albuterol independently prn.   OBESITY Reviewed weight and diet and activity level.  Exercise is limited by MSK conditions inclugin DDD,  OA and plantar fasciitis.  I have addressed  BMI and recommended wt loss of 10% of body weigh over the next 6 months  using a stricter low glycemic index diet and water aerobics for exercise  With goal a minimum of 5 days per week.    DIABETES MELLITUS, TYPE II Managed by endocrinologist,.  Records not available. Foot exam was normal today. She is taking an aspirin and an ARB    Updated Medication List Outpatient Encounter Prescriptions as of 07/26/2013  Medication Sig Dispense Refill  . albuterol (PROVENTIL HFA) 108 (90 BASE) MCG/ACT inhaler Inhale 2 puffs into the lungs every 4 (four) hours as needed.        Marland Kitchen  aspirin 81 MG tablet Take 1 tablet (81 mg total) by mouth daily.      . calcium carbonate (OS-CAL) 600 MG TABS Take 600 mg by mouth daily.        Marland Kitchen DAILY MULTIPLE VITAMINS PO Take 1 tablet by mouth daily.        . fluticasone (VERAMYST) 27.5 MCG/SPRAY nasal spray Place 2 sprays into the nose as needed.       . Fluticasone-Salmeterol (ADVAIR DISKUS) 250-50 MCG/DOSE AEPB Inhale 1 puff into the lungs as directed.        . insulin lispro (HUMALOG) 100 UNIT/ML injection Inject into the skin as directed.        . insulin NPH (HUMULIN N,NOVOLIN N) 100 UNIT/ML injection Inject 19 Units into the skin daily before breakfast. 21 units at bedtime      . methocarbamol (ROBAXIN) 500 MG tablet 1 tablet every 6 to 8 hours as needed      . mometasone (NASONEX) 50 MCG/ACT nasal spray Place 2 sprays into the nose daily.        Marland Kitchen omeprazole (PRILOSEC) 40 MG capsule Take 1 capsule (40 mg total) by mouth 2 (two) times daily.  60 capsule  6  . traMADol (ULTRAM) 50 MG tablet Take 1 tablet (50 mg total) by mouth every 8 (eight) hours as needed for pain.  90 tablet  1  . valsartan-hydrochlorothiazide (DIOVAN-HCT) 320-25 MG per tablet Take 1 tablet by mouth daily.  30 tablet  5  . zolpidem (AMBIEN) 10 MG tablet Take 10 mg by mouth at bedtime as needed.        . nitroGLYCERIN (NITROSTAT) 0.4 MG SL tablet Place 1 tablet (0.4 mg total) under the tongue every 5 (five) minutes as needed. Maximum 3 tablets  25 tablet  6   No  facility-administered encounter medications on file as of 07/26/2013.

## 2013-07-26 NOTE — Addendum Note (Signed)
Addended by: Nanci Pina on: 07/26/2013 03:01 PM   Modules accepted: Orders

## 2013-07-26 NOTE — Assessment & Plan Note (Addendum)
Atypical,  History of normal cardiac cath 7 years ago,  But with her history of uncontrolled DM I agree that risk stratification is necessary.  Stress test is scheduled for in the morning.  Also need to conisder MSK causes , given history of degenerative disk disease and pulmonary causes.  Advised her to try robaxin and albuterol independently prn.

## 2013-07-27 ENCOUNTER — Ambulatory Visit: Payer: Self-pay | Admitting: Cardiovascular Disease

## 2013-07-27 DIAGNOSIS — R0602 Shortness of breath: Secondary | ICD-10-CM | POA: Diagnosis not present

## 2013-07-27 DIAGNOSIS — R079 Chest pain, unspecified: Secondary | ICD-10-CM | POA: Diagnosis not present

## 2013-07-30 ENCOUNTER — Other Ambulatory Visit: Payer: Self-pay | Admitting: Internal Medicine

## 2013-07-30 ENCOUNTER — Other Ambulatory Visit: Payer: Self-pay

## 2013-07-30 DIAGNOSIS — R079 Chest pain, unspecified: Secondary | ICD-10-CM

## 2013-07-31 ENCOUNTER — Telehealth: Payer: Self-pay

## 2013-07-31 NOTE — Telephone Encounter (Signed)
Notified patient script ready for pick up.

## 2013-07-31 NOTE — Telephone Encounter (Signed)
Spoke w/ pt.  She will stop by and pick up a copy of stress test to bring to GI.

## 2013-07-31 NOTE — Telephone Encounter (Signed)
Pt would like results of stress test

## 2013-07-31 NOTE — Telephone Encounter (Signed)
It is scanned in the computer Dr. Fletcher Anon Read it yesterday , Normal stress test with no ischemia Daily except we'll risk for upcoming surgery/procedure

## 2013-07-31 NOTE — Telephone Encounter (Signed)
Dr. Rockey Situ, can you please read and sign off on the patient's myoview. She is waiting for results so she can be scheduled for endoscopy.

## 2013-08-06 ENCOUNTER — Telehealth: Payer: Self-pay

## 2013-08-06 NOTE — Telephone Encounter (Signed)
Message copied by Stana Bunting on Mon Aug 06, 2013  9:57 AM ------      Message from: Minna Merritts      Created: Thu Aug 02, 2013 10:44 PM       Normal stress test ------

## 2013-08-07 ENCOUNTER — Telehealth: Payer: Self-pay

## 2013-08-07 DIAGNOSIS — M722 Plantar fascial fibromatosis: Secondary | ICD-10-CM | POA: Diagnosis not present

## 2013-08-07 NOTE — Telephone Encounter (Signed)
Pt aware of results 

## 2013-08-09 DIAGNOSIS — M722 Plantar fascial fibromatosis: Secondary | ICD-10-CM | POA: Diagnosis not present

## 2013-08-10 DIAGNOSIS — M5126 Other intervertebral disc displacement, lumbar region: Secondary | ICD-10-CM | POA: Diagnosis not present

## 2013-08-13 DIAGNOSIS — D649 Anemia, unspecified: Secondary | ICD-10-CM | POA: Diagnosis not present

## 2013-08-13 DIAGNOSIS — M722 Plantar fascial fibromatosis: Secondary | ICD-10-CM | POA: Diagnosis not present

## 2013-08-15 DIAGNOSIS — M722 Plantar fascial fibromatosis: Secondary | ICD-10-CM | POA: Diagnosis not present

## 2013-08-16 DIAGNOSIS — I10 Essential (primary) hypertension: Secondary | ICD-10-CM | POA: Diagnosis not present

## 2013-08-16 DIAGNOSIS — E109 Type 1 diabetes mellitus without complications: Secondary | ICD-10-CM | POA: Diagnosis not present

## 2013-08-21 ENCOUNTER — Encounter: Payer: Self-pay | Admitting: Internal Medicine

## 2013-09-05 ENCOUNTER — Ambulatory Visit: Payer: Self-pay | Admitting: Unknown Physician Specialty

## 2013-09-05 DIAGNOSIS — E669 Obesity, unspecified: Secondary | ICD-10-CM | POA: Diagnosis not present

## 2013-09-05 DIAGNOSIS — Z885 Allergy status to narcotic agent status: Secondary | ICD-10-CM | POA: Diagnosis not present

## 2013-09-05 DIAGNOSIS — Z96659 Presence of unspecified artificial knee joint: Secondary | ICD-10-CM | POA: Diagnosis not present

## 2013-09-05 DIAGNOSIS — R1310 Dysphagia, unspecified: Secondary | ICD-10-CM | POA: Diagnosis not present

## 2013-09-05 DIAGNOSIS — Z8249 Family history of ischemic heart disease and other diseases of the circulatory system: Secondary | ICD-10-CM | POA: Diagnosis not present

## 2013-09-05 DIAGNOSIS — Z833 Family history of diabetes mellitus: Secondary | ICD-10-CM | POA: Diagnosis not present

## 2013-09-05 DIAGNOSIS — Z880 Allergy status to penicillin: Secondary | ICD-10-CM | POA: Diagnosis not present

## 2013-09-05 DIAGNOSIS — Z87891 Personal history of nicotine dependence: Secondary | ICD-10-CM | POA: Diagnosis not present

## 2013-09-05 DIAGNOSIS — E119 Type 2 diabetes mellitus without complications: Secondary | ICD-10-CM | POA: Diagnosis not present

## 2013-09-05 DIAGNOSIS — I10 Essential (primary) hypertension: Secondary | ICD-10-CM | POA: Diagnosis not present

## 2013-09-05 DIAGNOSIS — Z6837 Body mass index (BMI) 37.0-37.9, adult: Secondary | ICD-10-CM | POA: Diagnosis not present

## 2013-09-05 DIAGNOSIS — Z794 Long term (current) use of insulin: Secondary | ICD-10-CM | POA: Diagnosis not present

## 2013-09-05 DIAGNOSIS — Z8042 Family history of malignant neoplasm of prostate: Secondary | ICD-10-CM | POA: Diagnosis not present

## 2013-09-05 DIAGNOSIS — E785 Hyperlipidemia, unspecified: Secondary | ICD-10-CM | POA: Diagnosis not present

## 2013-09-05 DIAGNOSIS — K219 Gastro-esophageal reflux disease without esophagitis: Secondary | ICD-10-CM | POA: Diagnosis not present

## 2013-09-05 DIAGNOSIS — R079 Chest pain, unspecified: Secondary | ICD-10-CM | POA: Diagnosis not present

## 2013-09-05 DIAGNOSIS — Z800 Family history of malignant neoplasm of digestive organs: Secondary | ICD-10-CM | POA: Diagnosis not present

## 2013-09-05 DIAGNOSIS — Z79899 Other long term (current) drug therapy: Secondary | ICD-10-CM | POA: Diagnosis not present

## 2013-09-05 DIAGNOSIS — Z905 Acquired absence of kidney: Secondary | ICD-10-CM | POA: Diagnosis not present

## 2013-09-05 DIAGNOSIS — K449 Diaphragmatic hernia without obstruction or gangrene: Secondary | ICD-10-CM | POA: Diagnosis not present

## 2013-09-25 ENCOUNTER — Encounter: Payer: Self-pay | Admitting: Internal Medicine

## 2013-10-02 ENCOUNTER — Ambulatory Visit: Payer: Self-pay | Admitting: Internal Medicine

## 2013-10-02 DIAGNOSIS — Z1231 Encounter for screening mammogram for malignant neoplasm of breast: Secondary | ICD-10-CM | POA: Diagnosis not present

## 2013-10-02 LAB — HM MAMMOGRAPHY: HM Mammogram: NORMAL

## 2013-10-18 DIAGNOSIS — D649 Anemia, unspecified: Secondary | ICD-10-CM | POA: Diagnosis not present

## 2013-10-22 ENCOUNTER — Encounter: Payer: Self-pay | Admitting: Internal Medicine

## 2013-10-29 ENCOUNTER — Telehealth: Payer: Self-pay | Admitting: Internal Medicine

## 2013-10-29 NOTE — Telephone Encounter (Signed)
Please schedule

## 2013-10-29 NOTE — Telephone Encounter (Signed)
Caller: Shaniyah/Patient; Phone: [phone]; Reason for Call: Pt is very upset because there are no appt according to the office and she can never get in to see her doctor.  She is a diabetic and she is coughing up green discharge; no fever.  Husband is having the same issues and he just had rotator cuff sx and doesn't need to be coughing.   She is very frustrated that she can not get in to see MD when she needs to.  Refusing need for triage and will go to UC.

## 2013-10-29 NOTE — Telephone Encounter (Signed)
Both the patient and her husband have been scheduled.

## 2013-10-30 ENCOUNTER — Ambulatory Visit (INDEPENDENT_AMBULATORY_CARE_PROVIDER_SITE_OTHER): Payer: Medicare Other | Admitting: Internal Medicine

## 2013-10-30 ENCOUNTER — Encounter: Payer: Self-pay | Admitting: Internal Medicine

## 2013-10-30 ENCOUNTER — Encounter (INDEPENDENT_AMBULATORY_CARE_PROVIDER_SITE_OTHER): Payer: Self-pay

## 2013-10-30 VITALS — BP 132/76 | HR 80 | Temp 97.9°F | Wt 203.0 lb

## 2013-10-30 DIAGNOSIS — E119 Type 2 diabetes mellitus without complications: Secondary | ICD-10-CM | POA: Diagnosis not present

## 2013-10-30 DIAGNOSIS — J069 Acute upper respiratory infection, unspecified: Secondary | ICD-10-CM | POA: Diagnosis not present

## 2013-10-30 DIAGNOSIS — R079 Chest pain, unspecified: Secondary | ICD-10-CM | POA: Diagnosis not present

## 2013-10-30 MED ORDER — HYDROCODONE-ACETAMINOPHEN 10-325 MG PO TABS: ORAL_TABLET | ORAL | Status: DC

## 2013-10-30 MED ORDER — BENZONATATE 200 MG PO CAPS: 200.0000 mg | ORAL_CAPSULE | Freq: Three times a day (TID) | ORAL | Status: DC | PRN

## 2013-10-30 MED ORDER — LEVOFLOXACIN 500 MG PO TABS: 500.0000 mg | ORAL_TABLET | Freq: Every day | ORAL | Status: DC

## 2013-10-30 NOTE — Patient Instructions (Signed)
You have a viral  Syndrome .  The post nasal drip is causing your sore throat.  Flush your sinuses twice daily with Simply saline nasal spray.  Use benadryl 25 mg every 8 hours and Sudafed PE 10 to 30  Mg every 8 hours to manage the drainage and congestion.  Gargle with salt water often for the sore throat.  I am calling in Tessalon for the cough. You may use the vicodin for severe cough.   If the throat is no better  In 3 to 4 days OR  if you develop T > 100.4,  Green nasal discharge,  Or facial pain,  Start the levaquin . Please take a probiotic for a minimum of two weeks ( Align, Floraque or Culturelle) if you take the antibiotic to prevent a serious antibiotic associated diarrhea  Called clostirudium dificile colitis and a vaginal yeast infection .

## 2013-10-30 NOTE — Progress Notes (Signed)
Pre visit review using our clinic review tool, if applicable. No additional management support is needed unless otherwise documented below in the visit note. 

## 2013-10-30 NOTE — Progress Notes (Signed)
Patient ID: Andrea Stewart, female   DOB: 12-Sep-1946, 67 y.o.   MRN: [id number]  Patient Active Problem List   Diagnosis Date Noted  . Viral URI with cough 11/01/2013  . Irritable bowel syndrome 09/09/2012  . CHEST PAIN UNSPECIFIED 08/25/2009  . CAD 07/26/2009  . DIABETES MELLITUS, TYPE II 03/09/2007  . OBESITY 03/09/2007  . DEPRESSION 03/09/2007  . PERIPHERAL NEUROPATHY 03/09/2007  . HYPERTENSION 03/09/2007  . DEGENERATIVE DISC DISEASE 03/09/2007  . BACK PAIN, CHRONIC 03/09/2007  . PLANTAR FASCIITIS 03/09/2007  . FIBROMYALGIA 03/09/2007  . OSTEOPENIA 03/09/2007    Subjective:  CC:   Chief Complaint  Patient presents with  . Cough    HPI:   Andrea Stewart a 67 y.o. female who presents with Productive cough, nasal Congestion, post nasal drip,   sore throat,  No fevers or myalgia,  Some cough productuve of yellow sputum .  symptoms present for < 5 days . Positive sick contacts including great grandaaughter  And husband,    Past Medical History  Diagnosis Date  . Myalgia and myositis, unspecified   . Plantar fascial fibromatosis   . Personal history of tobacco use, presenting hazards to health     1/2 ppd  . Degeneration of intervertebral disc, site unspecified   . Backache, unspecified     s/p c-spine and l-spine fusions  . Obesity, unspecified   . Disorder of bone and cartilage, unspecified   . Unspecified hereditary and idiopathic peripheral neuropathy     secondary to diabetes  . Unspecified essential hypertension   . Type II or unspecified type diabetes mellitus without mention of complication, not stated as uncontrolled   . Depressive disorder, not elsewhere classified   . GERD (gastroesophageal reflux disease)   . S/p nephrectomy   . S/P cholecystectomy   . Hx of left heart catheterization by cutdown     7 years ago; no significant cad    Past Surgical History  Procedure Laterality Date  . Appendectomy    . Carpal tunnel release    .  Cholecystectomy    . Vesicovaginal fistula closure w/ tah    . Replacement total knee  10/2004  . Foot surgery      x 3  . Elbow surgery    . Trigger finger surgery    . Kidney donation  51  . Cervical discectomy    . Lumbar fusion    . Colonoscopy  09/1999    colonoscopy-hemorrhoids, re-check 5 years (10/2006)  . Cardiac catheterization      o.k. 2003  . Dexa      osteopenia (01/2004)  . Neck surgery      06/2005  . Back surgery      2006  . Problem with general anesthesia      02/2006  . Abdominal hysterectomy         The following portions of the patient's history were reviewed and updated as appropriate: Allergies, current medications, and problem list.    Review of Systems:   12 Pt  review of systems was negative except those addressed in the HPI,     History   Social History  . Marital Status: Married    Spouse Name: N/A    Number of Children: 4  . Years of Education: N/A   Occupational History  . Not on file.   Social History Main Topics  . Smoking status: Former Smoker -- 1.00 packs/day    Types: Cigarettes  Quit date: 08/31/2011  . Smokeless tobacco: Never Used  . Alcohol Use: No  . Drug Use: No  . Sexual Activity: Not on file   Other Topics Concern  . Not on file   Social History Narrative   Lives in [place]. Disability because of back    Objective:  Filed Vitals:   10/30/13 0903  BP: 132/76  Pulse: 80  Temp: 97.9 F (36.6 C)     General appearance: alert, cooperative and appears stated age Ears: normal TM's and external ear canals both ears Throat: lips, mucosa, and tongue normal; teeth and gums normal Neck: no adenopathy, no carotid bruit, supple, symmetrical, trachea midline and thyroid not enlarged, symmetric, no tenderness/mass/nodules Back: symmetric, no curvature. ROM normal. No CVA tenderness. Lungs: clear to auscultation bilaterally Heart: regular rate and rhythm, S1, S2 normal, no murmur, click, rub or  gallop Abdomen: soft, non-tender; bowel sounds normal; no masses,  no organomegaly Pulses: 2+ and symmetric Skin: Skin color, texture, turgor normal. No rashes or lesions Lymph nodes: Cervical, supraclavicular, and axillary nodes normal.  Assessment and Plan:   CHEST PAIN UNSPECIFIED She was evaluated in September with a Normal stress test      Viral URI with cough Symptoms of  URI are caused by viral infection currently given her current symptoms.   I have explained that in viral URIS, an antibiotic will not help the symptoms and will increase the risk of developing diarrhea. Advised to use oral and nasal decongestants,  Ibuprofen 400 mg and tylenol 650 mq 8 hrs for aches and pains,  tessalon every 8 hours prn cough  Advised to start round of abx only if symptoms worsen to include fevers, facial pain, purulent sputum./drainage.   DIABETES MELLITUS, TYPE II Her diabetes Managed by endocrinologist who is outside of the [facility] and does not send his  office notes on a regular basis  recent Records not available.    Updated Medication List Outpatient Encounter Prescriptions as of 10/30/2013  Medication Sig  . albuterol (PROVENTIL HFA) 108 (90 BASE) MCG/ACT inhaler Inhale 2 puffs into the lungs every 4 (four) hours as needed.    Marland Kitchen aspirin 81 MG tablet Take 1 tablet (81 mg total) by mouth daily.  . calcium carbonate (OS-CAL) 600 MG TABS Take 600 mg by mouth daily.    Marland Kitchen DAILY MULTIPLE VITAMINS PO Take 1 tablet by mouth daily.    . fluticasone (VERAMYST) 27.5 MCG/SPRAY nasal spray Place 2 sprays into the nose as needed.   . Fluticasone-Salmeterol (ADVAIR DISKUS) 250-50 MCG/DOSE AEPB Inhale 1 puff into the lungs as directed.    . insulin lispro (HUMALOG) 100 UNIT/ML injection Inject into the skin as directed.    . insulin NPH (HUMULIN N,NOVOLIN N) 100 UNIT/ML injection Inject 19 Units into the skin daily before breakfast. 21 units at bedtime  . methocarbamol (ROBAXIN) 500 MG tablet 1  tablet every 6 to 8 hours as needed  . mometasone (NASONEX) 50 MCG/ACT nasal spray Place 2 sprays into the nose daily.    . nitroGLYCERIN (NITROSTAT) 0.4 MG SL tablet Place 1 tablet (0.4 mg total) under the tongue every 5 (five) minutes as needed. Maximum 3 tablets  . omeprazole (PRILOSEC) 40 MG capsule Take 1 capsule (40 mg total) by mouth 2 (two) times daily.  . traMADol (ULTRAM) 50 MG tablet TAKE ONE TABLET EVERY EIGHT (8) HOURS ASNEEDED FOR PAIN  . valsartan-hydrochlorothiazide (DIOVAN-HCT) 320-25 MG per tablet Take 1 tablet by mouth daily.  Marland Kitchen  zolpidem (AMBIEN) 10 MG tablet Take 10 mg by mouth at bedtime as needed.    . BD INSULIN SYRINGE ULTRAFINE 31G X 5/16" 0.3 ML MISC   . benzonatate (TESSALON) 200 MG capsule Take 1 capsule (200 mg total) by mouth 3 (three) times daily as needed for cough.  Marland Kitchen HYDROcodone-acetaminophen (NORCO) 10-325 MG per tablet 1 tablet at bedtime as needed for pain or severe cough  . levofloxacin (LEVAQUIN) 500 MG tablet Take 1 tablet (500 mg total) by mouth daily.  . metFORMIN (GLUCOPHAGE-XR) 500 MG 24 hr tablet

## 2013-11-01 DIAGNOSIS — J069 Acute upper respiratory infection, unspecified: Secondary | ICD-10-CM | POA: Insufficient documentation

## 2013-11-01 NOTE — Assessment & Plan Note (Addendum)
She was evaluated in September with a Normal stress test

## 2013-11-01 NOTE — Assessment & Plan Note (Signed)
Her diabetes Managed by endocrinologist who is outside of the [facility] and does not send his  office notes on a regular basis  recent Records not available.

## 2013-11-01 NOTE — Assessment & Plan Note (Signed)
Symptoms of  URI are caused by viral infection currently given her current symptoms.   I have explained that in viral URIS, an antibiotic will not help the symptoms and will increase the risk of developing diarrhea. Advised to use oral and nasal decongestants,  Ibuprofen 400 mg and tylenol 650 mq 8 hrs for aches and pains,  tessalon every 8 hours prn cough  Advised to start round of abx only if symptoms worsen to include fevers, facial pain, purulent sputum./drainage.  

## 2013-12-11 DIAGNOSIS — M47812 Spondylosis without myelopathy or radiculopathy, cervical region: Secondary | ICD-10-CM | POA: Diagnosis not present

## 2013-12-11 DIAGNOSIS — M5126 Other intervertebral disc displacement, lumbar region: Secondary | ICD-10-CM | POA: Diagnosis not present

## 2014-01-01 LAB — HEMOGLOBIN A1C: Hgb A1c MFr Bld: 8.9 % — AB (ref 4.0–6.0)

## 2014-01-08 DIAGNOSIS — E109 Type 1 diabetes mellitus without complications: Secondary | ICD-10-CM | POA: Diagnosis not present

## 2014-01-21 ENCOUNTER — Telehealth: Payer: Self-pay | Admitting: Internal Medicine

## 2014-01-21 DIAGNOSIS — E785 Hyperlipidemia, unspecified: Secondary | ICD-10-CM

## 2014-01-21 DIAGNOSIS — E039 Hypothyroidism, unspecified: Secondary | ICD-10-CM

## 2014-01-21 DIAGNOSIS — E538 Deficiency of other specified B group vitamins: Secondary | ICD-10-CM

## 2014-01-21 DIAGNOSIS — D649 Anemia, unspecified: Secondary | ICD-10-CM

## 2014-01-21 NOTE — Telephone Encounter (Signed)
Appt scheduled with Dr. Gilford Rile for acute visit for leg pain on Thursday 3/12.  Pt asking to have B12, thyroid, and any other blood work she needs to have checked at that time as well.  Already had kidney function checked at diabetes doctor in [place] and will bring in a copy of those results.

## 2014-01-21 NOTE — Telephone Encounter (Signed)
Please ask her nephrologist and her endocrinoilogist to start sending me records,   They never have!!

## 2014-01-21 NOTE — Telephone Encounter (Signed)
Please advise 

## 2014-01-22 NOTE — Telephone Encounter (Signed)
Patient needs to sign release for medical records from endocrinology when she comes in on Thursday notified to ask front desk for release.

## 2014-01-24 ENCOUNTER — Ambulatory Visit: Payer: Medicare Other | Admitting: Internal Medicine

## 2014-01-28 ENCOUNTER — Encounter: Payer: Self-pay | Admitting: Adult Health

## 2014-01-28 ENCOUNTER — Encounter (INDEPENDENT_AMBULATORY_CARE_PROVIDER_SITE_OTHER): Payer: Self-pay

## 2014-01-28 ENCOUNTER — Ambulatory Visit (INDEPENDENT_AMBULATORY_CARE_PROVIDER_SITE_OTHER): Payer: Medicare Other | Admitting: Adult Health

## 2014-01-28 VITALS — BP 150/70 | HR 83 | Temp 98.6°F | Resp 16 | Wt 203.5 lb

## 2014-01-28 DIAGNOSIS — M79609 Pain in unspecified limb: Secondary | ICD-10-CM

## 2014-01-28 DIAGNOSIS — M79604 Pain in right leg: Secondary | ICD-10-CM

## 2014-01-28 NOTE — Progress Notes (Signed)
Patient ID: Andrea Stewart, female   DOB: 07/18/1946, 68 y.o.   MRN: [id number]    Subjective:    Patient ID: Andrea Stewart, female    DOB: 24-Jan-1946, 68 y.o.   MRN: [id number]  HPI  Pt is a 68 y/o female who presents to clinic with right leg pain x 2-3 weeks. Denies swelling or redness. She is concerned about a DVT and wants to make sure she does not have one. Denies injury to the area. She has been applying heat to the area without any improvement.  Past Medical History  Diagnosis Date  . Myalgia and myositis, unspecified   . Plantar fascial fibromatosis   . Personal history of tobacco use, presenting hazards to health     1/2 ppd  . Degeneration of intervertebral disc, site unspecified   . Backache, unspecified     s/p c-spine and l-spine fusions  . Obesity, unspecified   . Disorder of bone and cartilage, unspecified   . Unspecified hereditary and idiopathic peripheral neuropathy     secondary to diabetes  . Unspecified essential hypertension   . Type II or unspecified type diabetes mellitus without mention of complication, not stated as uncontrolled   . Depressive disorder, not elsewhere classified   . GERD (gastroesophageal reflux disease)   . S/p nephrectomy   . S/P cholecystectomy   . Hx of left heart catheterization by cutdown     7 years ago; no significant cad    Current Outpatient Prescriptions on File Prior to Visit  Medication Sig Dispense Refill  . albuterol (PROVENTIL HFA) 108 (90 BASE) MCG/ACT inhaler Inhale 2 puffs into the lungs every 4 (four) hours as needed.        Marland Kitchen aspirin 81 MG tablet Take 1 tablet (81 mg total) by mouth daily.      . BD INSULIN SYRINGE ULTRAFINE 31G X 5/16" 0.3 ML MISC       . calcium carbonate (OS-CAL) 600 MG TABS Take 600 mg by mouth daily.        Marland Kitchen DAILY MULTIPLE VITAMINS PO Take 1 tablet by mouth daily.        . fluticasone (VERAMYST) 27.5 MCG/SPRAY nasal spray Place 2 sprays into the nose as needed.       .  Fluticasone-Salmeterol (ADVAIR DISKUS) 250-50 MCG/DOSE AEPB Inhale 1 puff into the lungs as directed.        . insulin lispro (HUMALOG) 100 UNIT/ML injection Inject into the skin as directed.        . insulin NPH (HUMULIN N,NOVOLIN N) 100 UNIT/ML injection Inject 20 Units into the skin daily before breakfast. 22 units at bedtime      . metFORMIN (GLUCOPHAGE-XR) 500 MG 24 hr tablet Take 500 mg by mouth 2 (two) times daily.       . mometasone (NASONEX) 50 MCG/ACT nasal spray Place 2 sprays into the nose daily.        Marland Kitchen omeprazole (PRILOSEC) 40 MG capsule Take 1 capsule (40 mg total) by mouth 2 (two) times daily.  60 capsule  6  . traMADol (ULTRAM) 50 MG tablet TAKE ONE TABLET EVERY EIGHT (8) HOURS ASNEEDED FOR PAIN  90 tablet  5  . valsartan-hydrochlorothiazide (DIOVAN-HCT) 320-25 MG per tablet Take 1 tablet by mouth daily.  30 tablet  5  . zolpidem (AMBIEN) 10 MG tablet Take 10 mg by mouth at bedtime as needed.        . benzonatate (TESSALON) 200 MG capsule Take  1 capsule (200 mg total) by mouth 3 (three) times daily as needed for cough.  60 capsule  1  . methocarbamol (ROBAXIN) 500 MG tablet 1 tablet every 6 to 8 hours as needed      . nitroGLYCERIN (NITROSTAT) 0.4 MG SL tablet Place 1 tablet (0.4 mg total) under the tongue every 5 (five) minutes as needed. Maximum 3 tablets  25 tablet  6   No current facility-administered medications on file prior to visit.     Review of Systems  Constitutional: Negative.   Respiratory: Positive for shortness of breath (with exertion. This has been ongoing. Not new  symptom).   Cardiovascular: Positive for leg swelling. Negative for chest pain.  Musculoskeletal:       Right lower extremity pain x 2-3 weeks  Neurological: Positive for numbness (tingling of lower extremities).  Psychiatric/Behavioral: Negative.        Objective:  BP 150/70  Pulse 83  Temp(Src) 98.6 F (37 C) (Oral)  Resp 16  Wt 203 lb 8 oz (92.307 kg)  SpO2 94%   Physical Exam    Constitutional: She is oriented to person, place, and time. No distress.  Cardiovascular: Normal rate, regular rhythm and intact distal pulses.   Pulmonary/Chest: Effort normal. No respiratory distress.  Musculoskeletal: Normal range of motion. She exhibits edema and tenderness.  Neurological: She is alert and oriented to person, place, and time.  Skin: No erythema.  Psychiatric: She has a normal mood and affect. Her behavior is normal. Judgment and thought content normal.       Assessment & Plan:   1. Right leg pain Send for RLE doppler venous to r/o DVT. She has an appointment at [place] tomorrow at 1:30pm.

## 2014-01-28 NOTE — Patient Instructions (Signed)
  Please go for your right leg ultrasound tomorrow at 1:30 pm and Dr. Donivan Scull Office.  We will call you with the results once they are available.  I will contact you with the cost of the B12 lab

## 2014-01-28 NOTE — Progress Notes (Signed)
Pre visit review using our clinic review tool, if applicable. No additional management support is needed unless otherwise documented below in the visit note. 

## 2014-01-29 ENCOUNTER — Encounter (INDEPENDENT_AMBULATORY_CARE_PROVIDER_SITE_OTHER): Payer: Medicare Other

## 2014-01-29 DIAGNOSIS — M79609 Pain in unspecified limb: Secondary | ICD-10-CM

## 2014-01-29 DIAGNOSIS — M79604 Pain in right leg: Secondary | ICD-10-CM

## 2014-01-30 DIAGNOSIS — M19049 Primary osteoarthritis, unspecified hand: Secondary | ICD-10-CM | POA: Diagnosis not present

## 2014-01-30 DIAGNOSIS — M6530 Trigger finger, unspecified finger: Secondary | ICD-10-CM | POA: Diagnosis not present

## 2014-02-01 ENCOUNTER — Telehealth: Payer: Self-pay | Admitting: *Deleted

## 2014-02-01 NOTE — Telephone Encounter (Signed)
Pt is requesting a price for B12 testing

## 2014-02-03 NOTE — Telephone Encounter (Signed)
It will cost the patient roughly $64. If they are paying for it out pocket.

## 2014-02-04 ENCOUNTER — Other Ambulatory Visit: Payer: Self-pay | Admitting: Cardiovascular Disease

## 2014-02-04 ENCOUNTER — Other Ambulatory Visit: Payer: Self-pay

## 2014-02-04 NOTE — Telephone Encounter (Signed)
letter for B12 level drafted. Sent to [facility] per patient requst

## 2014-02-04 NOTE — Telephone Encounter (Signed)
Pt states that B12 testing is cheaper at [facility] and would like to have an order put in place for her to have that done there

## 2014-02-04 NOTE — Telephone Encounter (Signed)
Refill sent for omeprazole.  

## 2014-02-05 NOTE — Telephone Encounter (Signed)
Notified pt. 

## 2014-02-06 DIAGNOSIS — D649 Anemia, unspecified: Secondary | ICD-10-CM | POA: Diagnosis not present

## 2014-02-25 ENCOUNTER — Other Ambulatory Visit (INDEPENDENT_AMBULATORY_CARE_PROVIDER_SITE_OTHER): Payer: Medicare Other

## 2014-02-25 DIAGNOSIS — E039 Hypothyroidism, unspecified: Secondary | ICD-10-CM

## 2014-02-25 DIAGNOSIS — E785 Hyperlipidemia, unspecified: Secondary | ICD-10-CM

## 2014-02-25 DIAGNOSIS — D649 Anemia, unspecified: Secondary | ICD-10-CM

## 2014-02-25 DIAGNOSIS — E538 Deficiency of other specified B group vitamins: Secondary | ICD-10-CM

## 2014-02-25 LAB — CBC WITH DIFFERENTIAL/PLATELET
BASOS ABS: 0 10*3/uL (ref 0.0–0.1)
Basophils Relative: 0.4 % (ref 0.0–3.0)
Eosinophils Absolute: 0.3 10*3/uL (ref 0.0–0.7)
Eosinophils Relative: 5.1 % — ABNORMAL HIGH (ref 0.0–5.0)
HEMATOCRIT: 36.1 % (ref 36.0–46.0)
Hemoglobin: 12 g/dL (ref 12.0–15.0)
LYMPHS ABS: 1.9 10*3/uL (ref 0.7–4.0)
Lymphocytes Relative: 32.4 % (ref 12.0–46.0)
MCHC: 33.2 g/dL (ref 30.0–36.0)
MCV: 89.9 fl (ref 78.0–100.0)
MONO ABS: 0.5 10*3/uL (ref 0.1–1.0)
MONOS PCT: 7.9 % (ref 3.0–12.0)
Neutro Abs: 3.2 10*3/uL (ref 1.4–7.7)
Neutrophils Relative %: 54.2 % (ref 43.0–77.0)
PLATELETS: 236 10*3/uL (ref 150.0–400.0)
RBC: 4.02 Mil/uL (ref 3.87–5.11)
RDW: 14.6 % (ref 11.5–14.6)
WBC: 5.9 10*3/uL (ref 4.5–10.5)

## 2014-02-25 LAB — LIPID PANEL
Cholesterol: 177 mg/dL (ref 0–200)
HDL: 67.2 mg/dL (ref >39.00)
LDL CALC: 74 mg/dL (ref 0–99)
Total CHOL/HDL Ratio: 3
Triglycerides: 181 mg/dL — ABNORMAL HIGH (ref 0.0–149.0)
VLDL: 36.2 mg/dL (ref 0.0–40.0)

## 2014-02-25 LAB — TSH: TSH: 1.35 u[IU]/mL (ref 0.35–5.50)

## 2014-02-25 NOTE — Addendum Note (Signed)
Addended by: Karlene Einstein D on: 02/25/2014 01:58 PM   Modules accepted: Orders

## 2014-02-26 ENCOUNTER — Encounter: Payer: Self-pay | Admitting: *Deleted

## 2014-02-28 ENCOUNTER — Telehealth: Payer: Self-pay | Admitting: Internal Medicine

## 2014-02-28 NOTE — Telephone Encounter (Signed)
B12 was normal too,  [facility] sent it to me t)

## 2014-03-01 ENCOUNTER — Encounter: Payer: Self-pay | Admitting: Internal Medicine

## 2014-03-01 ENCOUNTER — Ambulatory Visit (INDEPENDENT_AMBULATORY_CARE_PROVIDER_SITE_OTHER): Payer: Medicare Other | Admitting: Internal Medicine

## 2014-03-01 VITALS — BP 136/70 | HR 84 | Temp 98.4°F | Resp 16 | Ht 59.0 in | Wt 203.8 lb

## 2014-03-01 DIAGNOSIS — Z23 Encounter for immunization: Secondary | ICD-10-CM

## 2014-03-01 DIAGNOSIS — J069 Acute upper respiratory infection, unspecified: Secondary | ICD-10-CM

## 2014-03-01 DIAGNOSIS — D509 Iron deficiency anemia, unspecified: Secondary | ICD-10-CM

## 2014-03-01 DIAGNOSIS — E669 Obesity, unspecified: Secondary | ICD-10-CM

## 2014-03-01 DIAGNOSIS — B9789 Other viral agents as the cause of diseases classified elsewhere: Secondary | ICD-10-CM

## 2014-03-01 DIAGNOSIS — Z0000 Encounter for general adult medical examination without abnormal findings: Secondary | ICD-10-CM | POA: Diagnosis not present

## 2014-03-01 LAB — HM DIABETES FOOT EXAM: HM Diabetic Foot Exam: NORMAL

## 2014-03-01 MED ORDER — LEVOFLOXACIN 500 MG PO TABS: 500.0000 mg | ORAL_TABLET | Freq: Every day | ORAL | Status: DC

## 2014-03-01 NOTE — Progress Notes (Signed)
Pre-visit discussion using our clinic review tool. No additional management support is needed unless otherwise documented below in the visit note.  

## 2014-03-01 NOTE — Progress Notes (Signed)
Patient ID: Andrea Stewart, female   DOB: 11/04/1946, 68 y.o.   MRN: [id number]  The patient is here for annual Medicare wellness examination and management of other chronic and acute problems. Medicare wellness  Her brittle diabetes is handled by endocrinology.,  taking NPH bid  and lispro  Tid   Foot exam done today,  Eye exam no retinopathy, up to date  Weight gain.  Since she Quit smoking in 2012, she has gained  Nearly 30Not exercsing due to persistent joint pain,  Has not tried water aerobics yet.     The risk factors are reflected in the social history.  The roster of all physicians providing medical care to patient - is listed in the Snapshot section of the chart.  Activities of daily living:  The patient is 100% independent in all ADLs: dressing, toileting, feeding as well as independent mobility  Home safety : The patient has smoke detectors in the home. They wear seatbelts.  There are no firearms at home. There is no violence in the home.   There is no risks for hepatitis, STDs or HIV. There is no   history of blood transfusion. They have no travel history to infectious disease endemic areas of the world.  The patient has seen their dentist in the last six month. They have seen their eye doctor in the last year. They admit to slight hearing difficulty with regard to whispered voices and some television programs.  They have deferred audiologic testing in the last year.  They do not  have excessive sun exposure. Discussed the need for sun protection: hats, long sleeves and use of sunscreen if there is significant sun exposure.   Diet: the importance of a healthy diet is discussed. They do have a healthy diet.  The benefits of regular aerobic exercise were discussed. She walks 4 times per week ,  20 minutes.   Depression screen: there are no signs or vegative symptoms of depression- irritability, change in appetite, anhedonia, sadness/tearfullness.  Cognitive assessment: the  patient manages all their financial and personal affairs and is actively engaged. They could relate day,date,year and events; recalled 2/3 objects at 3 minutes; performed clock-face test normally.  The following portions of the patient's history were reviewed and updated as appropriate: allergies, current medications, past family history, past medical history,  past surgical history, past social history  and problem list.  Visual acuity was not assessed per patient preference since she has regular follow up with her ophthalmologist. Hearing and body mass index were assessed and reviewed.   During the course of the visit the patient was educated and counseled about appropriate screening and preventive services including : fall prevention , diabetes screening, nutrition counseling, colorectal cancer screening, and recommended immunizations.    Objective:  BP 136/70  Pulse 84  Temp(Src) 98.4 F (36.9 C) (Oral)  Resp 16  Ht 4\' 11"  (1.499 m)  Wt 203 lb 12 oz (92.42 kg)  BMI 41.13 kg/m2  SpO2 97%  General appearance: alert, cooperative and appears stated age Head: Normocephalic, without obvious abnormality, atraumatic Eyes: conjunctivae/corneas clear. PERRL, EOM's intact. Fundi benign. Ears: normal TM's and external ear canals both ears Nose: Nares normal. Septum midline. Mucosa normal. No drainage or sinus tenderness. Throat: lips, mucosa, and tongue normal; teeth and gums normal Neck: no adenopathy, no carotid bruit, no JVD, supple, symmetrical, trachea midline and thyroid not enlarged, symmetric, no tenderness/mass/nodules Lungs: clear to auscultation bilaterally Breasts: normal appearance, no masses or tenderness Heart:  regular rate and rhythm, S1, S2 normal, no murmur, click, rub or gallop Abdomen: soft, non-tender; bowel sounds normal; no masses,  no organomegaly Extremities: extremities normal, atraumatic, no cyanosis or edema Pulses: 2+ and symmetric Skin: Skin color, texture,  turgor normal. No rashes or lesions Neurologic: Alert and oriented X 3, normal strength and tone. Normal symmetric reflexes. Normal coordination and gait.   Assesment and Plan:   Encounter for preventive health examination Annual comprehensive exam was done including breast, excluding pelvic and PAP smear. All screenings have been addressed .   OBESITY I have addressed  BMI and recommended wt loss of 10% of body weigh over the next 6 months using a low glycemic index diet and regular exercise a minimum of 3 days per week starting with water aerobics.   Viral URI with cough Symptoms of  URI are caused by viral infection currently given her current symptoms.   I have explained that in viral URIS, an antibiotic will not help the symptoms and will increase the risk of developing diarrhea. Advised to use oral and nasal decongestants,  Ibuprofen 400 mg and tylenol 650 mq 8 hrs for aches and pains,  tessalon every 8 hours prn cough  Advised to start round of abx only if symptoms worsen to include fevers, facial pain, purulent sputum./drainage.   History of anemia hgb is normal but iron studies need to be checked.     Updated Medication List Outpatient Encounter Prescriptions as of 03/01/2014  Medication Sig  . albuterol (PROVENTIL HFA) 108 (90 BASE) MCG/ACT inhaler Inhale 2 puffs into the lungs every 4 (four) hours as needed.    Marland Kitchen aspirin 81 MG tablet Take 1 tablet (81 mg total) by mouth daily.  . BD INSULIN SYRINGE ULTRAFINE 31G X 5/16" 0.3 ML MISC   . calcium carbonate (OS-CAL) 600 MG TABS Take 600 mg by mouth daily.    Marland Kitchen DAILY MULTIPLE VITAMINS PO Take 1 tablet by mouth daily.    . fluticasone (VERAMYST) 27.5 MCG/SPRAY nasal spray Place 2 sprays into the nose as needed.   . Fluticasone-Salmeterol (ADVAIR DISKUS) 250-50 MCG/DOSE AEPB Inhale 1 puff into the lungs as directed.    . hyoscyamine (ANASPAZ) 0.125 MG TBDP disintergrating tablet Place 0.125 mg under the tongue every 6 (six) hours as  needed.  . insulin lispro (HUMALOG) 100 UNIT/ML injection Inject into the skin as directed.    . insulin NPH (HUMULIN N,NOVOLIN N) 100 UNIT/ML injection Inject 20 Units into the skin daily before breakfast. 22 units at bedtime  . metFORMIN (GLUCOPHAGE-XR) 500 MG 24 hr tablet Take 500 mg by mouth 2 (two) times daily.   . methocarbamol (ROBAXIN) 500 MG tablet 1 tablet every 6 to 8 hours as needed  . Misc Natural Products (TURMERIC CURCUMIN) CAPS Take 1 capsule by mouth daily.  . mometasone (NASONEX) 50 MCG/ACT nasal spray Place 2 sprays into the nose daily.    . nitroGLYCERIN (NITROSTAT) 0.4 MG SL tablet Place 1 tablet (0.4 mg total) under the tongue every 5 (five) minutes as needed. Maximum 3 tablets  . omeprazole (PRILOSEC) 40 MG capsule TAKE ONE CAPSULE TWICE A DAY  . sodium chloride (OCEAN) 0.65 % SOLN nasal spray Place 2 sprays into both nostrils as needed for congestion.  . traMADol (ULTRAM) 50 MG tablet TAKE ONE TABLET EVERY EIGHT (8) HOURS ASNEEDED FOR PAIN  . valsartan-hydrochlorothiazide (DIOVAN-HCT) 320-25 MG per tablet Take 1 tablet by mouth daily.  . benzonatate (TESSALON) 200 MG capsule Take 1  capsule (200 mg total) by mouth 3 (three) times daily as needed for cough.  Marland Kitchen levofloxacin (LEVAQUIN) 500 MG tablet Take 1 tablet (500 mg total) by mouth daily.  Marland Kitchen zolpidem (AMBIEN) 10 MG tablet Take 10 mg by mouth at bedtime as needed.

## 2014-03-01 NOTE — Patient Instructions (Addendum)
You had your annual Medicare wellness exam today  We will schedule your mammogram in November  Please have your endocrinologist check you iron stores and send them to me so we can decide whether you still need iron   You received the pneumonia vaccine today.  We will contact you with the bloodwork results    You have a viral  Syndrome .  The post nasal drip is causing your sore throat.  Lavage your sinuses twice daly with Simply saline nasal spray.  Use benadryl 25 mg every 8 hours and Sudafed PE 10 to 30 mg  every 8 hours to manage the drainage and congestion.  Gargle with salt water often for the sore throat.  delsym for the cough  If the throat is no better  In 3 to 4 days OR  if you develop T > 100.4,  Green nasal discharge,  Or facial pain,  Start the antibiotic.Please take a probiotic ( Align, Floraque or Culturelle) while you are on the antibiotic to prevent a serious antibiotic associated diarrhea  Called clostirudium dificile colitis and a vaginal yeast infection

## 2014-03-01 NOTE — Telephone Encounter (Signed)
Patient notified

## 2014-03-03 DIAGNOSIS — Z862 Personal history of diseases of the blood and blood-forming organs and certain disorders involving the immune mechanism: Secondary | ICD-10-CM | POA: Insufficient documentation

## 2014-03-03 DIAGNOSIS — Z0000 Encounter for general adult medical examination without abnormal findings: Secondary | ICD-10-CM | POA: Insufficient documentation

## 2014-03-03 NOTE — Assessment & Plan Note (Signed)
Annual comprehensive exam was done including breast, excluding pelvic and PAP smear. All screenings have been addressed .  

## 2014-03-03 NOTE — Assessment & Plan Note (Signed)
I have addressed  BMI and recommended wt loss of 10% of body weigh over the next 6 months using a low glycemic index diet and regular exercise a minimum of 3 days per week starting with water aerobics.

## 2014-03-03 NOTE — Assessment & Plan Note (Signed)
hgb is normal but iron studies need to be checked.

## 2014-03-03 NOTE — Assessment & Plan Note (Signed)
Symptoms of  URI are caused by viral infection currently given her current symptoms.   I have explained that in viral URIS, an antibiotic will not help the symptoms and will increase the risk of developing diarrhea. Advised to use oral and nasal decongestants,  Ibuprofen 400 mg and tylenol 650 mq 8 hrs for aches and pains,  tessalon every 8 hours prn cough  Advised to start round of abx only if symptoms worsen to include fevers, facial pain, purulent sputum./drainage.  

## 2014-03-04 DIAGNOSIS — M5126 Other intervertebral disc displacement, lumbar region: Secondary | ICD-10-CM | POA: Diagnosis not present

## 2014-03-04 DIAGNOSIS — M47812 Spondylosis without myelopathy or radiculopathy, cervical region: Secondary | ICD-10-CM | POA: Diagnosis not present

## 2014-03-06 ENCOUNTER — Ambulatory Visit: Payer: Self-pay | Admitting: Neurosurgery

## 2014-03-06 DIAGNOSIS — M1610 Unilateral primary osteoarthritis, unspecified hip: Secondary | ICD-10-CM | POA: Diagnosis not present

## 2014-03-06 DIAGNOSIS — M169 Osteoarthritis of hip, unspecified: Secondary | ICD-10-CM | POA: Diagnosis not present

## 2014-03-14 ENCOUNTER — Encounter: Payer: Self-pay | Admitting: Cardiovascular Disease

## 2014-03-14 ENCOUNTER — Ambulatory Visit (INDEPENDENT_AMBULATORY_CARE_PROVIDER_SITE_OTHER): Payer: Medicare Other | Admitting: Cardiovascular Disease

## 2014-03-14 VITALS — BP 140/86 | HR 74 | Ht 59.0 in | Wt 205.2 lb

## 2014-03-14 DIAGNOSIS — E669 Obesity, unspecified: Secondary | ICD-10-CM

## 2014-03-14 DIAGNOSIS — I10 Essential (primary) hypertension: Secondary | ICD-10-CM

## 2014-03-14 DIAGNOSIS — E119 Type 2 diabetes mellitus without complications: Secondary | ICD-10-CM

## 2014-03-14 DIAGNOSIS — R079 Chest pain, unspecified: Secondary | ICD-10-CM

## 2014-03-14 DIAGNOSIS — I2510 Atherosclerotic heart disease of native coronary artery without angina pectoris: Secondary | ICD-10-CM | POA: Diagnosis not present

## 2014-03-14 NOTE — Assessment & Plan Note (Signed)
Blood pressure is well controlled on today's visit. No changes made to the medications. 

## 2014-03-14 NOTE — Assessment & Plan Note (Signed)
We have encouraged continued exercise, careful diet management in an effort to lose weight. 

## 2014-03-14 NOTE — Assessment & Plan Note (Signed)
Stress to her the importance of good diabetes control.

## 2014-03-14 NOTE — Assessment & Plan Note (Addendum)
No further chest pain on today's visit. She is high risk of coronary artery disease given her underlying diabetes. Recommended she call our office for any new symptoms

## 2014-03-14 NOTE — Patient Instructions (Signed)
You are doing well. No medication changes were made.  Please call us if you have new issues that need to be addressed before your next appt.  Your physician wants you to follow-up in: 6 months.  You will receive a reminder letter in the mail two months in advance. If you don't receive a letter, please call our office to schedule the follow-up appointment.   

## 2014-03-14 NOTE — Progress Notes (Signed)
Patient ID: Andrea Stewart, female    DOB: 1946/06/25, 68 y.o.   MRN: [id number]  HPI Comments: Ms. Moleski is a 68 year old woman with history of obesity, poorly controlled diabetes, followed by Dr. Soyla Murphy, prior smoking history ,  who presents for routine followup. 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 patient of   Dr. Derrel Nip  Cardiac catheterization by report more than 7 years ago that showed no significant CAD. She was previously seen by Dr. Olevia Perches.  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 Limited secondary to her arthritis conditions. She denies having any significant chest discomfort. No exercise, weight continues to be a problem She reports having brittle diabetes. Sugars running around 200 recently which she reports is good for her  EKG shows normal sinus rhythm with rate 74 beats per minute   Outpatient Encounter Prescriptions as of 03/14/2014  Medication Sig  . albuterol (PROVENTIL HFA) 108 (90 BASE) MCG/ACT inhaler Inhale 2 puffs into the lungs every 4 (four) hours as needed.    Marland Kitchen aspirin 81 MG tablet Take 1 tablet (81 mg total) by mouth daily.  . BD INSULIN SYRINGE ULTRAFINE 31G X 5/16" 0.3 ML MISC   . benzonatate (TESSALON) 200 MG capsule Take 1 capsule (200 mg total) by mouth 3 (three) times daily as needed for cough.  . calcium carbonate (OS-CAL) 600 MG TABS Take 600 mg by mouth daily.    Marland Kitchen DAILY MULTIPLE VITAMINS PO Take 1 tablet by mouth daily.    . fluticasone (VERAMYST) 27.5 MCG/SPRAY nasal spray Place 2 sprays into the nose as needed.   . Fluticasone-Salmeterol (ADVAIR DISKUS) 250-50 MCG/DOSE AEPB Inhale 1 puff into the lungs as directed.    . hyoscyamine (ANASPAZ) 0.125 MG TBDP disintergrating tablet Place 0.125 mg under the tongue every 6 (six) hours as needed.  . insulin lispro (HUMALOG) 100 UNIT/ML injection Inject into the skin  as directed.    . insulin NPH (HUMULIN N,NOVOLIN N) 100 UNIT/ML injection Inject 20 Units into the skin daily before breakfast. 22 units at bedtime  . metFORMIN (GLUCOPHAGE-XR) 500 MG 24 hr tablet Take 500 mg by mouth 2 (two) times daily.   . methocarbamol (ROBAXIN) 500 MG tablet 1 tablet every 6 to 8 hours as needed  . Misc Natural Products (TURMERIC CURCUMIN) CAPS Take 1 capsule by mouth daily.  . mometasone (NASONEX) 50 MCG/ACT nasal spray Place 2 sprays into the nose daily.    . nitroGLYCERIN (NITROSTAT) 0.4 MG SL tablet Place 1 tablet (0.4 mg total) under the tongue every 5 (five) minutes as needed. Maximum 3 tablets  . omeprazole (PRILOSEC) 40 MG capsule TAKE ONE CAPSULE TWICE A DAY  . sodium chloride (OCEAN) 0.65 % SOLN nasal spray Place 2 sprays into both nostrils as needed for congestion.  . traMADol (ULTRAM) 50 MG tablet TAKE ONE TABLET EVERY EIGHT (8) HOURS ASNEEDED FOR PAIN  . valsartan-hydrochlorothiazide (DIOVAN-HCT) 320-25 MG per tablet Take 1 tablet by mouth daily.  Marland Kitchen zolpidem (AMBIEN) 10 MG tablet Take 10 mg by mouth at bedtime as needed.    . [DISCONTINUED] levofloxacin (LEVAQUIN) 500 MG tablet Take 1 tablet (500 mg total) by mouth daily.     Review of Systems  Constitutional: Negative.   HENT: Negative.   Eyes: Negative.   Respiratory: Negative.   Cardiovascular: Positive for chest pain.  Gastrointestinal:  Negative.   Endocrine: Negative.   Musculoskeletal: Positive for arthralgias, back pain, gait problem and joint swelling.  Skin: Negative.   Allergic/Immunologic: Negative.   Neurological: Negative.   Hematological: Negative.   Psychiatric/Behavioral: Negative.   All other systems reviewed and are negative.   BP 140/86  Pulse 77  Ht 4\' 11"  (1.499 m)  Wt 205 lb 4 oz (93.101 kg)  BMI 41.43 kg/m2  Physical Exam  Nursing note and vitals reviewed. Constitutional: She is oriented to person, place, and time. She appears well-developed and well-nourished.  Obese   HENT:  Head: Normocephalic.  Nose: Nose normal.  Mouth/Throat: Oropharynx is clear and moist.  Eyes: Conjunctivae are normal. Pupils are equal, round, and reactive to light.  Neck: Normal range of motion. Neck supple. No JVD present.  Cardiovascular: Normal rate, regular rhythm, S1 normal, S2 normal, normal heart sounds and intact distal pulses.  Exam reveals no gallop and no friction rub.   No murmur heard. Pulmonary/Chest: Effort normal and breath sounds normal. No respiratory distress. She has no wheezes. She has no rales. She exhibits no tenderness.  Abdominal: Soft. Bowel sounds are normal. She exhibits no distension. There is no tenderness.  Musculoskeletal: Normal range of motion. She exhibits no edema and no tenderness.  Lymphadenopathy:    She has no cervical adenopathy.  Neurological: She is alert and oriented to person, place, and time. Coordination normal.  Skin: Skin is warm and dry. No rash noted. No erythema.  Psychiatric: She has a normal mood and affect. Her behavior is normal. Judgment and thought content normal.    Assessment and Plan

## 2014-03-20 DIAGNOSIS — M6530 Trigger finger, unspecified finger: Secondary | ICD-10-CM | POA: Diagnosis not present

## 2014-04-02 ENCOUNTER — Other Ambulatory Visit: Payer: Self-pay | Admitting: Cardiovascular Disease

## 2014-04-02 ENCOUNTER — Ambulatory Visit: Payer: Self-pay | Admitting: Neurosurgery

## 2014-04-02 DIAGNOSIS — M169 Osteoarthritis of hip, unspecified: Secondary | ICD-10-CM | POA: Diagnosis not present

## 2014-04-02 DIAGNOSIS — M25559 Pain in unspecified hip: Secondary | ICD-10-CM | POA: Diagnosis not present

## 2014-04-02 DIAGNOSIS — M1610 Unilateral primary osteoarthritis, unspecified hip: Secondary | ICD-10-CM | POA: Diagnosis not present

## 2014-05-13 DIAGNOSIS — E109 Type 1 diabetes mellitus without complications: Secondary | ICD-10-CM | POA: Diagnosis not present

## 2014-05-15 DIAGNOSIS — M25559 Pain in unspecified hip: Secondary | ICD-10-CM | POA: Diagnosis not present

## 2014-05-15 DIAGNOSIS — Z96659 Presence of unspecified artificial knee joint: Secondary | ICD-10-CM | POA: Diagnosis not present

## 2014-05-15 DIAGNOSIS — M76899 Other specified enthesopathies of unspecified lower limb, excluding foot: Secondary | ICD-10-CM | POA: Diagnosis not present

## 2014-05-21 ENCOUNTER — Encounter: Payer: Self-pay | Admitting: Internal Medicine

## 2014-06-05 DIAGNOSIS — H2510 Age-related nuclear cataract, unspecified eye: Secondary | ICD-10-CM | POA: Diagnosis not present

## 2014-07-04 DIAGNOSIS — M47812 Spondylosis without myelopathy or radiculopathy, cervical region: Secondary | ICD-10-CM | POA: Diagnosis not present

## 2014-07-04 DIAGNOSIS — M5126 Other intervertebral disc displacement, lumbar region: Secondary | ICD-10-CM | POA: Diagnosis not present

## 2014-07-08 LAB — HM DIABETES EYE EXAM

## 2014-08-06 ENCOUNTER — Other Ambulatory Visit: Payer: Self-pay | Admitting: Cardiovascular Disease

## 2014-09-17 ENCOUNTER — Ambulatory Visit: Payer: Medicare Other

## 2014-09-19 ENCOUNTER — Ambulatory Visit: Payer: Medicare Other | Admitting: Cardiovascular Disease

## 2014-09-24 ENCOUNTER — Ambulatory Visit: Payer: Medicare Other

## 2014-09-24 ENCOUNTER — Ambulatory Visit (INDEPENDENT_AMBULATORY_CARE_PROVIDER_SITE_OTHER): Payer: Medicare Other | Admitting: *Deleted

## 2014-09-24 DIAGNOSIS — Z23 Encounter for immunization: Secondary | ICD-10-CM | POA: Diagnosis not present

## 2014-09-30 ENCOUNTER — Telehealth: Payer: Self-pay

## 2014-09-30 ENCOUNTER — Encounter: Payer: Self-pay | Admitting: Cardiovascular Disease

## 2014-09-30 ENCOUNTER — Ambulatory Visit (INDEPENDENT_AMBULATORY_CARE_PROVIDER_SITE_OTHER): Payer: Medicare Other | Admitting: Cardiovascular Disease

## 2014-09-30 VITALS — BP 152/82 | HR 86 | Ht 62.0 in | Wt 194.2 lb

## 2014-09-30 DIAGNOSIS — E1165 Type 2 diabetes mellitus with hyperglycemia: Secondary | ICD-10-CM

## 2014-09-30 DIAGNOSIS — I2583 Coronary atherosclerosis due to lipid rich plaque: Secondary | ICD-10-CM

## 2014-09-30 DIAGNOSIS — I2510 Atherosclerotic heart disease of native coronary artery without angina pectoris: Secondary | ICD-10-CM

## 2014-09-30 DIAGNOSIS — I10 Essential (primary) hypertension: Secondary | ICD-10-CM

## 2014-09-30 DIAGNOSIS — IMO0002 Reserved for concepts with insufficient information to code with codable children: Secondary | ICD-10-CM

## 2014-09-30 DIAGNOSIS — Z720 Tobacco use: Secondary | ICD-10-CM | POA: Diagnosis not present

## 2014-09-30 DIAGNOSIS — Z87891 Personal history of nicotine dependence: Secondary | ICD-10-CM

## 2014-09-30 DIAGNOSIS — E118 Type 2 diabetes mellitus with unspecified complications: Secondary | ICD-10-CM

## 2014-09-30 DIAGNOSIS — Z905 Acquired absence of kidney: Secondary | ICD-10-CM

## 2014-09-30 MED ORDER — AMLODIPINE BESYLATE 10 MG PO TABS: 10.0000 mg | ORAL_TABLET | Freq: Every day | ORAL | Status: DC

## 2014-09-30 MED ORDER — LOSARTAN POTASSIUM 100 MG PO TABS: 100.0000 mg | ORAL_TABLET | Freq: Every day | ORAL | Status: DC

## 2014-09-30 NOTE — Telephone Encounter (Signed)
Pharmacist called states pt has been on Valsartan, states Losartan was called in today, not sure if this is a change or not. Please call and advise

## 2014-09-30 NOTE — Assessment & Plan Note (Signed)
Stressed importance of better diabetes control as well as her blood pressure given her single kidney.

## 2014-09-30 NOTE — Telephone Encounter (Signed)
Spoke w/ Anderson Malta.  Advised her that pt was seen today and Dr. Rockey Situ changed her meds. Asked her to call back w/ further questions or concerns.

## 2014-09-30 NOTE — Progress Notes (Signed)
Patient ID: Andrea Stewart, female    DOB: [birth year], 68 y.o.   MRN: [id number]  HPI Comments: [name] is a 68 year old woman with history of obesity, poorly controlled diabetes, followed by Dr. Soyla Murphy, prior smoking history ,  who presents for routine followup of her shortness of breath, hypertension.  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 patient of   Dr. Derrel Nip  In follow-up today, she reports that her sugars have been running in the 400 range. She continues to eat out periodically. Reports that she tried an insulin pump and this made her sugars worse. She is scheduled to go on a cruise in 3 weeks' time. Weight continues to be a major issue although she has dropped 10 pounds over the past year. Her biggest complaint is leg cramping on a nightly basis. She takes tonic water, mustard for relief. Reports a blood pressure has been reasonably well controlled at home but she does not check this regularly It is elevated in the office today, [id number] systolic with nurses, 0000000 on my check  EKG shows normal sinus rhythm with rate 86 bpm with PVCs, no significant ST or T-wave changes  Other past medical history Cardiac catheterization by report more than 7 years ago that showed no significant CAD. She was previously seen by Dr. Olevia Perches.  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 Limited secondary to her arthritis conditions.   Outpatient Encounter Prescriptions as of 09/30/2014  Medication Sig  . albuterol (PROVENTIL HFA) 108 (90 BASE) MCG/ACT inhaler Inhale 2 puffs into the lungs every 4 (four) hours as needed.    Marland Kitchen aspirin 81 MG tablet Take 1 tablet (81 mg total) by mouth daily.  . BD INSULIN SYRINGE ULTRAFINE 31G X 5/16" 0.3 ML MISC   . benzonatate (TESSALON) 200 MG capsule Take 1 capsule (200 mg total) by mouth 3 (three) times daily as needed for cough.  .  calcium carbonate (OS-CAL) 600 MG TABS Take 600 mg by mouth daily.    Marland Kitchen DAILY MULTIPLE VITAMINS PO Take 1 tablet by mouth daily.    . fluticasone (VERAMYST) 27.5 MCG/SPRAY nasal spray Place 2 sprays into the nose as needed.   . Fluticasone-Salmeterol (ADVAIR DISKUS) 250-50 MCG/DOSE AEPB Inhale 1 puff into the lungs as directed.    . hyoscyamine (ANASPAZ) 0.125 MG TBDP disintergrating tablet Place 0.125 mg under the tongue every 6 (six) hours as needed.  . insulin lispro (HUMALOG) 100 UNIT/ML injection Inject into the skin as directed.    . insulin NPH (HUMULIN N,NOVOLIN N) 100 UNIT/ML injection Inject 20 Units into the skin daily before breakfast. 22 units at bedtime  . metFORMIN (GLUCOPHAGE-XR) 500 MG 24 hr tablet Take 500 mg by mouth 2 (two) times daily.   . methocarbamol (ROBAXIN) 500 MG tablet 1 tablet every 6 to 8 hours as needed  . Misc Natural Products (TURMERIC CURCUMIN) CAPS Take 1 capsule by mouth daily.  . mometasone (NASONEX) 50 MCG/ACT nasal spray Place 2 sprays into the nose daily.    . nitroGLYCERIN (NITROSTAT) 0.4 MG SL tablet Place 1 tablet (0.4 mg total) under the tongue every 5 (five) minutes as needed. Maximum 3 tablets  . omeprazole (PRILOSEC) 40 MG capsule TAKE ONE CAPSULE TWICE A DAY  . sodium chloride (OCEAN) 0.65 % SOLN nasal spray Place 2 sprays into both nostrils as needed  for congestion.  . traMADol (ULTRAM) 50 MG tablet TAKE ONE TABLET EVERY EIGHT (8) HOURS ASNEEDED FOR PAIN  . valsartan-hydrochlorothiazide (DIOVAN-HCT) 320-25 MG per tablet Take 1 tablet by mouth daily.  Marland Kitchen zolpidem (AMBIEN) 10 MG tablet Take 10 mg by mouth at bedtime as needed.     Social history  reports that she quit smoking about 3 years ago. Her smoking use included Cigarettes. She smoked 1.00 pack per day. She has never used smokeless tobacco. She reports that she does not drink alcohol or use illicit drugs.  Review of Systems  Constitutional: Negative.   Eyes: Negative.   Respiratory:  Positive for shortness of breath.   Cardiovascular: Positive for leg swelling.  Gastrointestinal: Negative.   Endocrine: Negative.   Musculoskeletal: Positive for back pain, joint swelling, arthralgias and gait problem.  Neurological: Negative.   Psychiatric/Behavioral: Negative.   All other systems reviewed and are negative.   Ht 5\' 2"  (1.575 m)  Wt 194 lb 4 oz (88.111 kg)  BMI 35.52 kg/m2  Physical Exam  Constitutional: She is oriented to person, place, and time. She appears well-developed and well-nourished.  Morbidly obese  HENT:  Head: Normocephalic.  Nose: Nose normal.  Mouth/Throat: Oropharynx is clear and moist.  Eyes: Conjunctivae are normal. Pupils are equal, round, and reactive to light.  Neck: Normal range of motion. Neck supple. No JVD present.  Cardiovascular: Normal rate, regular rhythm, S1 normal, S2 normal, normal heart sounds and intact distal pulses.  Exam reveals no gallop and no friction rub.   No murmur heard. Pulmonary/Chest: Effort normal and breath sounds normal. No respiratory distress. She has no wheezes. She has no rales. She exhibits no tenderness.  Abdominal: Soft. Bowel sounds are normal. She exhibits no distension. There is no tenderness.  Musculoskeletal: Normal range of motion. She exhibits no edema or tenderness.  Lymphadenopathy:    She has no cervical adenopathy.  Neurological: She is alert and oriented to person, place, and time. Coordination normal.  Skin: Skin is warm and dry. No rash noted. No erythema.  Psychiatric: She has a normal mood and affect. Her behavior is normal. Judgment and thought content normal.    Assessment and Plan  Nursing note and vitals reviewed.

## 2014-09-30 NOTE — Assessment & Plan Note (Signed)
Poor diet, continued weight problems though she is down 10 pounds over the past year. We have encouraged continued exercise, careful diet management in an effort to lose weight.

## 2014-09-30 NOTE — Assessment & Plan Note (Signed)
We have stressed the importance of weight loss. She does not exercise. Recommended a strict diet with portion control

## 2014-09-30 NOTE — Assessment & Plan Note (Signed)
She denies any recent smoking history.

## 2014-09-30 NOTE — Patient Instructions (Addendum)
You are doing well.  Take one to two pepcid/zantac at dinner for acid, with omeprazole  Please try losartan one a day (without the HCTZ) for cramping Hold the valsartan HCTZ  Monitor your blood pressure If it runs high, start amlodipine 1/2 pill daily Call the office with BP numbers  Please call us if you have new issues that need to be addressed before your next appt.  Your physician wants you to follow-up in: 6 months.  You will receive a reminder letter in the mail two months in advance. If you don't receive a letter, please call our office to schedule the follow-up appointment.

## 2014-09-30 NOTE — Assessment & Plan Note (Signed)
Given her severe leg cramping every night, we have suggested she hold the Diovan HCTZ, start losartan 100 mg daily. Suggested she closely monitor her blood pressure and if this runs high, would start amlodipine 5 mg daily with titration up to 10 mg daily if needed

## 2014-10-03 ENCOUNTER — Encounter: Payer: Self-pay | Admitting: Cardiovascular Disease

## 2014-10-04 NOTE — Telephone Encounter (Signed)
Pt called in asking how often doctor checks his mychart. Stated i would sent message to the nurse about this.

## 2014-10-05 ENCOUNTER — Emergency Department (HOSPITAL_COMMUNITY): Payer: Medicare Other

## 2014-10-05 ENCOUNTER — Encounter (HOSPITAL_COMMUNITY): Payer: Self-pay | Admitting: Emergency Medicine

## 2014-10-05 ENCOUNTER — Emergency Department (HOSPITAL_COMMUNITY)
Admission: EM | Admit: 2014-10-05 | Discharge: 2014-10-05 | Disposition: A | Discharge status: Home / Self Care | Payer: Medicare Other | Attending: Emergency Medicine | Admitting: Emergency Medicine

## 2014-10-05 DIAGNOSIS — Z905 Acquired absence of kidney: Secondary | ICD-10-CM | POA: Insufficient documentation

## 2014-10-05 DIAGNOSIS — Z9889 Other specified postprocedural states: Secondary | ICD-10-CM | POA: Insufficient documentation

## 2014-10-05 DIAGNOSIS — Z9049 Acquired absence of other specified parts of digestive tract: Secondary | ICD-10-CM | POA: Diagnosis not present

## 2014-10-05 DIAGNOSIS — Z79899 Other long term (current) drug therapy: Secondary | ICD-10-CM | POA: Insufficient documentation

## 2014-10-05 DIAGNOSIS — K219 Gastro-esophageal reflux disease without esophagitis: Secondary | ICD-10-CM | POA: Insufficient documentation

## 2014-10-05 DIAGNOSIS — Z8669 Personal history of other diseases of the nervous system and sense organs: Secondary | ICD-10-CM | POA: Insufficient documentation

## 2014-10-05 DIAGNOSIS — R52 Pain, unspecified: Secondary | ICD-10-CM

## 2014-10-05 DIAGNOSIS — Z87891 Personal history of nicotine dependence: Secondary | ICD-10-CM | POA: Insufficient documentation

## 2014-10-05 DIAGNOSIS — E119 Type 2 diabetes mellitus without complications: Secondary | ICD-10-CM | POA: Diagnosis not present

## 2014-10-05 DIAGNOSIS — F329 Major depressive disorder, single episode, unspecified: Secondary | ICD-10-CM | POA: Insufficient documentation

## 2014-10-05 DIAGNOSIS — Z7982 Long term (current) use of aspirin: Secondary | ICD-10-CM | POA: Diagnosis not present

## 2014-10-05 DIAGNOSIS — R079 Chest pain, unspecified: Secondary | ICD-10-CM | POA: Diagnosis present

## 2014-10-05 DIAGNOSIS — Z7951 Long term (current) use of inhaled steroids: Secondary | ICD-10-CM | POA: Diagnosis not present

## 2014-10-05 DIAGNOSIS — J40 Bronchitis, not specified as acute or chronic: Secondary | ICD-10-CM | POA: Diagnosis not present

## 2014-10-05 DIAGNOSIS — Z794 Long term (current) use of insulin: Secondary | ICD-10-CM | POA: Insufficient documentation

## 2014-10-05 DIAGNOSIS — I10 Essential (primary) hypertension: Secondary | ICD-10-CM | POA: Insufficient documentation

## 2014-10-05 DIAGNOSIS — R0789 Other chest pain: Secondary | ICD-10-CM | POA: Diagnosis not present

## 2014-10-05 DIAGNOSIS — Z880 Allergy status to penicillin: Secondary | ICD-10-CM | POA: Diagnosis not present

## 2014-10-05 DIAGNOSIS — Z8739 Personal history of other diseases of the musculoskeletal system and connective tissue: Secondary | ICD-10-CM | POA: Diagnosis not present

## 2014-10-05 DIAGNOSIS — E669 Obesity, unspecified: Secondary | ICD-10-CM | POA: Diagnosis not present

## 2014-10-05 LAB — CBC
HCT: 35.8 % — ABNORMAL LOW (ref 36.0–46.0)
HEMOGLOBIN: 11.7 g/dL — AB (ref 12.0–15.0)
MCH: 29.3 pg (ref 26.0–34.0)
MCHC: 32.7 g/dL (ref 30.0–36.0)
MCV: 89.5 fL (ref 78.0–100.0)
Platelets: 215 10*3/uL (ref 150–400)
RBC: 4 MIL/uL (ref 3.87–5.11)
RDW: 15 % (ref 11.5–15.5)
WBC: 6.9 10*3/uL (ref 4.0–10.5)

## 2014-10-05 LAB — TROPONIN I: Troponin I: <0.3 ng/mL (ref <0.30)

## 2014-10-05 LAB — BASIC METABOLIC PANEL
Anion gap: 15 (ref 5–15)
BUN: 18 mg/dL (ref 6–23)
CO2: 21 meq/L (ref 19–32)
CREATININE: 1.18 mg/dL — AB (ref 0.50–1.10)
Calcium: 9.4 mg/dL (ref 8.4–10.5)
Chloride: 105 mEq/L (ref 96–112)
GFR calc Af Amer: 53 mL/min — ABNORMAL LOW (ref >90)
GFR calc non Af Amer: 46 mL/min — ABNORMAL LOW (ref >90)
Glucose, Bld: 135 mg/dL — ABNORMAL HIGH (ref 70–99)
Potassium: 4.2 mEq/L (ref 3.7–5.3)
Sodium: 141 mEq/L (ref 137–147)

## 2014-10-05 LAB — I-STAT TROPONIN, ED: Troponin i, poc: 0.02 ng/mL (ref 0.00–0.08)

## 2014-10-05 NOTE — ED Provider Notes (Signed)
CSN: [id number]     Arrival date & time 10/05/14  1445 History   First MD Initiated Contact with Patient 10/05/14 1616     Chief Complaint  Patient presents with  . Chest Pain  . Hypertension     (Consider location/radiation/quality/duration/timing/severity/associated sxs/prior Treatment) Patient is a 68 y.o. female presenting with chest pain and hypertension. The history is provided by the patient.  Chest Pain Associated symptoms: no abdominal pain, no back pain, no cough, no fever, no headache, no numbness, no palpitations, no shortness of breath, not vomiting and no weakness   Hypertension Associated symptoms include chest pain. Pertinent negatives include no abdominal pain, no headaches and no shortness of breath.  pt with hx htn, states her bp has been high and has been having chest heaviness/pressure in past week. Cp occurs at rest, will last hours to day, no relation to activity or exertion, not pleuritic, and no associated nv or diaphoresis. No specific exacerbating or alleviating factors.  States saw her cardiologist this past week for same and was told possible due to high blood pressure. States last week, due to high bp and c/o muscular cramping, her diovan hctz was d/c'd and she was placed on losartan and amlodipine.  Pt states concerned that bp has still been high - stating she checks in at various stores, and with home monitor. States today went to a neighbor who is an emt, and bp was 170/90 was so was advised to come to ER.  No headaches. No change in vision or speech. No numbness/weakness. No unusual doe.  Pt denies hx cad, states cath several yrs ago neg. +smoker. No leg pain or swelling, no immobility, trauma or recent surgery.      Past Medical History  Diagnosis Date  . Myalgia and myositis, unspecified   . Plantar fascial fibromatosis   . Personal history of tobacco use, presenting hazards to health     1/2 ppd  . Degeneration of intervertebral disc, site unspecified    . Backache, unspecified     s/p c-spine and l-spine fusions  . Obesity, unspecified   . Disorder of bone and cartilage, unspecified   . Unspecified hereditary and idiopathic peripheral neuropathy     secondary to diabetes  . Unspecified essential hypertension   . Type II or unspecified type diabetes mellitus without mention of complication, not stated as uncontrolled   . Depressive disorder, not elsewhere classified   . GERD (gastroesophageal reflux disease)   . S/p nephrectomy   . S/P cholecystectomy   . Hx of left heart catheterization by cutdown     7 years ago; no significant cad   Past Surgical History  Procedure Laterality Date  . Appendectomy    . Carpal tunnel release    . Cholecystectomy    . Vesicovaginal fistula closure w/ tah    . Replacement total knee  10/2004  . Foot surgery      x 3  . Elbow surgery    . Trigger finger surgery    . Kidney donation  15  . Cervical discectomy    . Lumbar fusion    . Colonoscopy  09/1999    colonoscopy-hemorrhoids, re-check 5 years (10/2006)  . Cardiac catheterization      o.k. 2003  . Dexa      osteopenia (01/2004)  . Neck surgery      06/2005  . Back surgery      2006  . Problem with general anesthesia  02/2006  . Abdominal hysterectomy     Family History  Problem Relation Age of Onset  . Hypertension Mother   . Colon cancer Mother   . Cancer Mother     colon  . Coronary artery disease Mother   . Prostate cancer Father   . COPD Father   . Cancer Father     prostate  . Heart attack Brother 39  . Sleep apnea Brother    History  Substance Use Topics  . Smoking status: Former Smoker -- 1.00 packs/day    Types: Cigarettes    Quit date: 08/31/2011  . Smokeless tobacco: Never Used  . Alcohol Use: No   OB History    No data available     Review of Systems  Constitutional: Negative for fever and chills.  HENT: Negative for sore throat.   Eyes: Negative for redness.  Respiratory: Negative for cough  and shortness of breath.   Cardiovascular: Positive for chest pain. Negative for palpitations and leg swelling.  Gastrointestinal: Negative for vomiting, abdominal pain and diarrhea.  Genitourinary: Negative for flank pain.  Musculoskeletal: Negative for back pain and neck pain.  Skin: Negative for rash.  Neurological: Negative for weakness, numbness and headaches.  Hematological: Does not bruise/bleed easily.  Psychiatric/Behavioral: Negative for confusion.      Allergies  Gabapentin; Morphine; Nsaids; and Penicillins  Home Medications   Prior to Admission medications   Medication Sig Start Date End Date Taking? Authorizing Provider  albuterol (PROVENTIL HFA) 108 (90 BASE) MCG/ACT inhaler Inhale 2 puffs into the lungs every 4 (four) hours as needed.      Historical Provider, MD  amLODipine (NORVASC) 10 MG tablet Take 1 tablet (10 mg total) by mouth daily. 09/30/14   Minna Merritts, MD  aspirin 81 MG tablet Take 1 tablet (81 mg total) by mouth daily. 06/19/13   Minna Merritts, MD  BD INSULIN SYRINGE ULTRAFINE 31G X 5/16" 0.3 ML MISC  10/02/13   Historical Provider, MD  benzonatate (TESSALON) 200 MG capsule Take 1 capsule (200 mg total) by mouth 3 (three) times daily as needed for cough. 10/30/13   Crecencio Mc, MD  calcium carbonate (OS-CAL) 600 MG TABS Take 600 mg by mouth daily.      Historical Provider, MD  DAILY MULTIPLE VITAMINS PO Take 1 tablet by mouth daily.      Historical Provider, MD  fluticasone (VERAMYST) 27.5 MCG/SPRAY nasal spray Place 2 sprays into the nose as needed.     Historical Provider, MD  Fluticasone-Salmeterol (ADVAIR DISKUS) 250-50 MCG/DOSE AEPB Inhale 1 puff into the lungs as directed.      Historical Provider, MD  hyoscyamine (ANASPAZ) 0.125 MG TBDP disintergrating tablet Place 0.125 mg under the tongue every 6 (six) hours as needed.    Historical Provider, MD  insulin lispro (HUMALOG) 100 UNIT/ML injection Inject into the skin as directed.       Historical Provider, MD  insulin NPH (HUMULIN N,NOVOLIN N) 100 UNIT/ML injection Inject 20 Units into the skin daily before breakfast. 22 units at bedtime    Historical Provider, MD  losartan (COZAAR) 100 MG tablet Take 1 tablet (100 mg total) by mouth daily. 09/30/14   Minna Merritts, MD  metFORMIN (GLUCOPHAGE-XR) 500 MG 24 hr tablet Take 500 mg by mouth 2 (two) times daily.  10/26/13   Historical Provider, MD  methocarbamol (ROBAXIN) 500 MG tablet 1 tablet every 6 to 8 hours as needed    Historical Provider, MD  Misc Natural Products (TURMERIC CURCUMIN) CAPS Take 1 capsule by mouth daily.    Historical Provider, MD  mometasone (NASONEX) 50 MCG/ACT nasal spray Place 2 sprays into the nose daily.      Historical Provider, MD  nitroGLYCERIN (NITROSTAT) 0.4 MG SL tablet Place 1 tablet (0.4 mg total) under the tongue every 5 (five) minutes as needed. Maximum 3 tablets 06/19/13   Minna Merritts, MD  omeprazole (PRILOSEC) 40 MG capsule TAKE ONE CAPSULE TWICE A DAY 08/06/14   Minna Merritts, MD  sodium chloride (OCEAN) 0.65 % SOLN nasal spray Place 2 sprays into both nostrils as needed for congestion.    Historical Provider, MD  traMADol (ULTRAM) 50 MG tablet TAKE ONE TABLET EVERY EIGHT (8) HOURS ASNEEDED FOR PAIN 07/30/13   Crecencio Mc, MD  valsartan-hydrochlorothiazide (DIOVAN-HCT) 320-25 MG per tablet Take 1 tablet by mouth daily. 12/07/11   Crecencio Mc, MD  zolpidem (AMBIEN) 10 MG tablet Take 10 mg by mouth at bedtime as needed.      Historical Provider, MD   BP 163/79 mmHg  Pulse 41  Temp(Src) 97.8 F (36.6 C) (Oral)  Resp 20  Ht 5\' 2"  (1.575 m)  Wt 190 lb (86.183 kg)  BMI 34.74 kg/m2  SpO2 97% Physical Exam  Constitutional: She is oriented to person, place, and time. She appears well-developed and well-nourished. No distress.  HENT:  Mouth/Throat: Oropharynx is clear and moist.  Eyes: Conjunctivae are normal. No scleral icterus.  Neck: Neck supple. No tracheal deviation present. No  thyromegaly present.  Cardiovascular: Normal rate, regular rhythm, normal heart sounds and intact distal pulses.  Exam reveals no gallop and no friction rub.   No murmur heard. Pulmonary/Chest: Effort normal and breath sounds normal. No respiratory distress. She exhibits no tenderness.  Abdominal: Soft. Normal appearance and bowel sounds are normal. She exhibits no distension. There is no tenderness.  Musculoskeletal: She exhibits no edema or tenderness.  Neurological: She is alert and oriented to person, place, and time.  Skin: Skin is warm and dry. No rash noted. She is not diaphoretic.  Psychiatric: She has a normal mood and affect.  Nursing note and vitals reviewed.   ED Course  Procedures (including critical care time) Labs Review  Results for orders placed or performed during the hospital encounter of 10/05/14  CBC  Result Value Ref Range   WBC 6.9 4.0 - 10.5 K/uL   RBC 4.00 3.87 - 5.11 MIL/uL   Hemoglobin 11.7 (L) 12.0 - 15.0 g/dL   HCT 35.8 (L) 36.0 - 46.0 %   MCV 89.5 78.0 - 100.0 fL   MCH 29.3 26.0 - 34.0 pg   MCHC 32.7 30.0 - 36.0 g/dL   RDW 15.0 11.5 - 15.5 %   Platelets 215 150 - 400 K/uL  Basic metabolic panel  Result Value Ref Range   Sodium 141 137 - 147 mEq/L   Potassium 4.2 3.7 - 5.3 mEq/L   Chloride 105 96 - 112 mEq/L   CO2 21 19 - 32 mEq/L   Glucose, Bld 135 (H) 70 - 99 mg/dL   BUN 18 6 - 23 mg/dL   Creatinine, Ser 1.18 (H) 0.50 - 1.10 mg/dL   Calcium 9.4 8.4 - 10.5 mg/dL   GFR calc non Af Amer 46 (L) >90 mL/min   GFR calc Af Amer 53 (L) >90 mL/min   Anion gap 15 5 - 15  Troponin I  Result Value Ref Range   Troponin I <0.30 <  0.30 ng/mL  I-stat troponin, ED (not at [facility])  Result Value Ref Range   Troponin i, poc 0.02 0.00 - 0.08 ng/mL   Comment 3           Dg Chest 2 View  10/05/2014   CLINICAL DATA:  Short of breath, heaviness in chest.  EXAM: CHEST  2 VIEW  COMPARISON:  08/01/2006  FINDINGS: Normal cardiac silhouette. Chronic bronchitic markings  are present. No effusion, infiltrate, pneumothorax. Lungs are hyperinflated. Degenerative osteophytosis of the thoracic spine. Anterior cervical fusion noted.  IMPRESSION: 1. Hyperinflated lungs. 2. Chronic bronchitic markings. 3. No acute findings.   Electronically Signed   By: Suzy Bouchard M.D.   On: 10/05/2014 18:26      EKG Interpretation   Date/Time:  Saturday October 05 2014 14:48:59 EST Ventricular Rate:  86 PR Interval:  152 QRS Duration: 76 QT Interval:  356 QTC Calculation: 426 R Axis:   3 Text Interpretation:  Normal sinus rhythm Low voltage QRS No significant  change since last tracing Confirmed by Ashok Cordia  MD, Lennette Bihari ([id number]) on  10/05/2014 4:12:04 PM      MDM   Monitor, continuous pulse ox. Labs. Ecg. Cxr.  Reviewed nursing notes and prior charts for additional history.   Reviewed prior charts, cardiac cath for similar cp 2003 - normal coronaries. Subsequent cardiology visit w chest pressure/heaviness a few years ago, myoview neg.   Hr in computer noted at 41 - on monitor hr 80's and pulse in 80's.  After symptoms for days, trop x 2 neg. ecg neg acute. cxr neg.  bp improved, 150/66.    Pt appears stable for d/c.     Mirna Mires, MD 10/05/14 2018

## 2014-10-05 NOTE — ED Notes (Signed)
Pt. Stated, I went to see my doctor on Monday and he changed my BP medication cause of my cramping.  He put me on 2 diferent medications, and I've been having this heaviness like indigestion, feet swelling, and my BP is running high.

## 2014-10-05 NOTE — Discharge Instructions (Signed)
It was our pleasure to provide your ER care today - we hope that you feel better.  Continue your blood pressure medication as prescribed. Limit salt intake. Avoid smoking.  For hypertension, follow up with your primary care doctor in the next couple days for recheck. For chest discomfort, follow up with cardiologist in coming week.  Return to ER right away if worse, new symptoms, trouble breathing, persistent/recurrent chest pain, fevers, severe headache, other concern.       Hypertension Hypertension, commonly called high blood pressure, is when the force of blood pumping through your arteries is too strong. Your arteries are the blood vessels that carry blood from your heart throughout your body. A blood pressure reading consists of a higher number over a lower number, such as 110/72. The higher number (systolic) is the pressure inside your arteries when your heart pumps. The lower number (diastolic) is the pressure inside your arteries when your heart relaxes. Ideally you want your blood pressure below 120/80. Hypertension forces your heart to work harder to pump blood. Your arteries may become narrow or stiff. Having hypertension puts you at risk for heart disease, stroke, and other problems.  RISK FACTORS Some risk factors for high blood pressure are controllable. Others are not.  Risk factors you cannot control include:   Race. You may be at higher risk if you are African American.  Age. Risk increases with age.  Gender. Men are at higher risk than women before age 40 years. After age 47, women are at higher risk than men. Risk factors you can control include:  Not getting enough exercise or physical activity.  Being overweight.  Getting too much fat, sugar, calories, or salt in your diet.  Drinking too much alcohol. SIGNS AND SYMPTOMS Hypertension does not usually cause signs or symptoms. Extremely high blood pressure (hypertensive crisis) may cause headache, anxiety,  shortness of breath, and nosebleed. DIAGNOSIS  To check if you have hypertension, your health care provider will measure your blood pressure while you are seated, with your arm held at the level of your heart. It should be measured at least twice using the same arm. Certain conditions can cause a difference in blood pressure between your right and left arms. A blood pressure reading that is higher than normal on one occasion does not mean that you need treatment. If one blood pressure reading is high, ask your health care provider about having it checked again. TREATMENT  Treating high blood pressure includes making lifestyle changes and possibly taking medicine. Living a healthy lifestyle can help lower high blood pressure. You may need to change some of your habits. Lifestyle changes may include:  Following the DASH diet. This diet is high in fruits, vegetables, and whole grains. It is low in salt, red meat, and added sugars.  Getting at least 2 hours of brisk physical activity every week.  Losing weight if necessary.  Not smoking.  Limiting alcoholic beverages.  Learning ways to reduce stress. If lifestyle changes are not enough to get your blood pressure under control, your health care provider may prescribe medicine. You may need to take more than one. Work closely with your health care provider to understand the risks and benefits. HOME CARE INSTRUCTIONS  Have your blood pressure rechecked as directed by your health care provider.   Take medicines only as directed by your health care provider. Follow the directions carefully. Blood pressure medicines must be taken as prescribed. The medicine does not work as well when  you skip doses. Skipping doses also puts you at risk for problems.   Do not smoke.   Monitor your blood pressure at home as directed by your health care provider. SEEK MEDICAL CARE IF:   You think you are having a reaction to medicines taken.  You have  recurrent headaches or feel dizzy.  You have swelling in your ankles.  You have trouble with your vision. SEEK IMMEDIATE MEDICAL CARE IF:  You develop a severe headache or confusion.  You have unusual weakness, numbness, or feel faint.  You have severe chest or abdominal pain.  You vomit repeatedly.  You have trouble breathing. MAKE SURE YOU:   Understand these instructions.  Will watch your condition.  Will get help right away if you are not doing well or get worse. Document Released: 11/01/2005 Document Revised: 03/18/2014 Document Reviewed: 08/24/2013 [facility] Patient Information 2015 [place], [place]. This information is not intended to replace advice given to you by your health care provider. Make sure you discuss any questions you have with your health care provider.     Chest Pain (Nonspecific) It is often hard to give a specific diagnosis for the cause of chest pain. There is always a chance that your pain could be related to something serious, such as a heart attack or a blood clot in the lungs. You need to follow up with your health care provider for further evaluation. CAUSES   Heartburn.  Pneumonia or bronchitis.  Anxiety or stress.  Inflammation around your heart (pericarditis) or lung (pleuritis or pleurisy).  A blood clot in the lung.  A collapsed lung (pneumothorax). It can develop suddenly on its own (spontaneous pneumothorax) or from trauma to the chest.  Shingles infection (herpes zoster virus). The chest wall is composed of bones, muscles, and cartilage. Any of these can be the source of the pain.  The bones can be bruised by injury.  The muscles or cartilage can be strained by coughing or overwork.  The cartilage can be affected by inflammation and become sore (costochondritis). DIAGNOSIS  Lab tests or other studies may be needed to find the cause of your pain. Your health care provider may have you take a test called an ambulatory  electrocardiogram (ECG). An ECG records your heartbeat patterns over a 24-hour period. You may also have other tests, such as:  Transthoracic echocardiogram (TTE). During echocardiography, sound waves are used to evaluate how blood flows through your heart.  Transesophageal echocardiogram (TEE).  Cardiac monitoring. This allows your health care provider to monitor your heart rate and rhythm in real time.  Holter monitor. This is a portable device that records your heartbeat and can help diagnose heart arrhythmias. It allows your health care provider to track your heart activity for several days, if needed.  Stress tests by exercise or by giving medicine that makes the heart beat faster. TREATMENT   Treatment depends on what may be causing your chest pain. Treatment may include:  Acid blockers for heartburn.  Anti-inflammatory medicine.  Pain medicine for inflammatory conditions.  Antibiotics if an infection is present.  You may be advised to change lifestyle habits. This includes stopping smoking and avoiding alcohol, caffeine, and chocolate.  You may be advised to keep your head raised (elevated) when sleeping. This reduces the chance of acid going backward from your stomach into your esophagus. Most of the time, nonspecific chest pain will improve within 2-3 days with rest and mild pain medicine.  HOME CARE INSTRUCTIONS   If  antibiotics were prescribed, take them as directed. Finish them even if you start to feel better.  For the next few days, avoid physical activities that bring on chest pain. Continue physical activities as directed.  Do not use any tobacco products, including cigarettes, chewing tobacco, or electronic cigarettes.  Avoid drinking alcohol.  Only take medicine as directed by your health care provider.  Follow your health care provider's suggestions for further testing if your chest pain does not go away.  Keep any follow-up appointments you made. If you do  not go to an appointment, you could develop lasting (chronic) problems with pain. If there is any problem keeping an appointment, call to reschedule. SEEK MEDICAL CARE IF:   Your chest pain does not go away, even after treatment.  You have a rash with blisters on your chest.  You have a fever. SEEK IMMEDIATE MEDICAL CARE IF:   You have increased chest pain or pain that spreads to your arm, neck, jaw, back, or abdomen.  You have shortness of breath.  You have an increasing cough, or you cough up blood.  You have severe back or abdominal pain.  You feel nauseous or vomit.  You have severe weakness.  You faint.  You have chills. This is an emergency. Do not wait to see if the pain will go away. Get medical help at once. Call your local emergency services (911 in [place] Do not drive yourself to the hospital. MAKE SURE YOU:   Understand these instructions.  Will watch your condition.  Will get help right away if you are not doing well or get worse. Document Released: 08/11/2005 Document Revised: 11/06/2013 Document Reviewed: 06/06/2008 [facility] Patient Information 2015 [place], [place]. This information is not intended to replace advice given to you by your health care provider. Make sure you discuss any questions you have with your health care provider.     Cardiac Diet This diet can help prevent heart disease and stroke. Many factors influence your heart health, including eating and exercise habits. Coronary risk rises a lot with abnormal blood fat (lipid) levels. Cardiac meal planning includes limiting unhealthy fats, increasing healthy fats, and making other small dietary changes. General guidelines are as follows:  Adjust calorie intake to reach and maintain desirable body weight.  Limit total fat intake to less than 30% of total calories. Saturated fat should be less than 7% of calories.  Saturated fats are found in animal products and in some vegetable products.  Saturated vegetable fats are found in coconut oil, cocoa butter, palm oil, and palm kernel oil. Read labels carefully to avoid these products as much as possible. Use butter in moderation. Choose tub margarines and oils that have 2 grams of fat or less. Good cooking oils are canola and olive oils.  Practice low-fat cooking techniques. Do not fry food. Instead, broil, bake, boil, steam, grill, roast on a rack, stir-fry, or microwave it. Other fat reducing suggestions include:  Remove the skin from poultry.  Remove all visible fat from meats.  Skim the fat off stews, soups, and gravies before serving them.  Steam vegetables in water or broth instead of sauting them in fat.  Avoid foods with trans fat (or hydrogenated oils), such as commercially fried foods and commercially baked goods. Commercial shortening and deep-frying fats will contain trans fat.  Increase intake of fruits, vegetables, whole grains, and legumes to replace foods high in fat.  Increase consumption of nuts, legumes, and seeds to at least 4 servings  weekly. One serving of a legume equals  cup, and 1 serving of nuts or seeds equals  cup.  Choose whole grains more often. Have 3 servings per day (a serving is 1 ounce [oz]).  Eat 4 to 5 servings of vegetables per day. A serving of vegetables is 1 cup of raw leafy vegetables;  cup of raw or cooked cut-up vegetables;  cup of vegetable juice.  Eat 4 to 5 servings of fruit per day. A serving of fruit is 1 medium whole fruit;  cup of dried fruit;  cup of fresh, frozen, or canned fruit;  cup of 100% fruit juice.  Increase your intake of dietary fiber to 20 to 30 grams per day. Insoluble fiber may help lower your risk of heart disease and may help curb your appetite.  Soluble fiber binds cholesterol to be removed from the blood. Foods high in soluble fiber are dried beans, citrus fruits, oats, apples, bananas, broccoli, Brussels sprouts, and eggplant.  Try to include foods  fortified with plant sterols or stanols, such as yogurt, breads, juices, or margarines. Choose several fortified foods to achieve a daily intake of 2 to 3 grams of plant sterols or stanols.  Foods with omega-3 fats can help reduce your risk of heart disease. Aim to have a 3.5 oz portion of fatty fish twice per week, such as salmon, mackerel, albacore tuna, sardines, lake trout, or herring. If you wish to take a fish oil supplement, choose one that contains 1 gram of both DHA and EPA.  Limit processed meats to 2 servings (3 oz portion) weekly.  Limit the sodium in your diet to 1500 milligrams (mg) per day. If you have high blood pressure, talk to a registered dietitian about a DASH (Dietary Approaches to Stop Hypertension) eating plan.  Limit sweets and beverages with added sugar, such as soda, to no more than 5 servings per week. One serving is:   1 tablespoon sugar.  1 tablespoon jelly or jam.   cup sorbet.  1 cup lemonade.   cup regular soda. CHOOSING FOODS Starches  Allowed: Breads: All kinds (wheat, rye, raisin, white, oatmeal, [place], [place], and English muffin bread). Low-fat rolls: English muffins, frankfurter and hamburger buns, bagels, pita bread, tortillas (not fried). Pancakes, waffles, biscuits, and muffins made with recommended oil.  Avoid: Products made with saturated or trans fats, oils, or whole milk products. Butter rolls, cheese breads, croissants. Commercial doughnuts, muffins, sweet rolls, biscuits, waffles, pancakes, store-bought mixes. Crackers  Allowed: Low-fat crackers and snacks: Animal, graham, rye, saltine (with recommended oil, no lard), oyster, and matzo crackers. Bread sticks, melba toast, rusks, flatbread, pretzels, and light popcorn.  Avoid: High-fat crackers: cheese crackers, butter crackers, and those made with coconut, palm oil, or trans fat (hydrogenated oils). Buttered popcorn. Cereals  Allowed: Hot or cold whole-grain cereals.  Avoid:  Cereals containing coconut, hydrogenated vegetable fat, or animal fat. Potatoes / Pasta / Rice  Allowed: All kinds of potatoes, rice, and pasta (such as macaroni, spaghetti, and noodles).  Avoid: Pasta or rice prepared with cream sauce or high-fat cheese. Chow mein noodles, [place] fries. Vegetables  Allowed: All vegetables and vegetable juices.  Avoid: Fried vegetables. Vegetables in cream, butter, or high-fat cheese sauces. Limit coconut. Fruit in cream or custard. Protein  Allowed: Limit your intake of meat, seafood, and poultry to no more than 6 oz (cooked weight) per day. All lean, well-trimmed beef, veal, pork, and lamb. All chicken and [place] without skin. All fish and shellfish. Wild  game: wild duck, rabbit, pheasant, and venison. Egg whites or low-cholesterol egg substitutes may be used as desired. Meatless dishes: recipes with dried beans, peas, lentils, and tofu (soybean curd). Seeds and nuts: all seeds and most nuts.  Avoid: Prime grade and other heavily marbled and fatty meats, such as short ribs, spare ribs, rib eye roast or steak, frankfurters, sausage, bacon, and high-fat luncheon meats, mutton. Caviar. Commercially fried fish. Domestic duck, goose, venison sausage. Organ meats: liver, gizzard, heart, chitterlings, brains, kidney, sweetbreads. Dairy  Allowed: Low-fat cheeses: nonfat or low-fat cottage cheese (1% or 2% fat), cheeses made with part skim milk, such as mozzarella, farmers, string, or ricotta. (Cheeses should be labeled no more than 2 to 6 grams fat per oz.). Skim (or 1%) milk: liquid, powdered, or evaporated. Buttermilk made with low-fat milk. Drinks made with skim or low-fat milk or cocoa. Chocolate milk or cocoa made with skim or low-fat (1%) milk. Nonfat or low-fat yogurt.  Avoid: Whole milk cheeses, including colby, cheddar, mu[place], Monterey Jack, [place], [place], [place], American, Swiss, and blue. Creamed cottage cheese, cream cheese. Whole milk and whole milk  products, including buttermilk or yogurt made from whole milk, drinks made from whole milk. Condensed milk, evaporated whole milk, and 2% milk. Soups and Combination Foods  Allowed: Low-fat low-sodium soups: broth, dehydrated soups, homemade broth, soups with the fat removed, homemade cream soups made with skim or low-fat milk. Low-fat spaghetti, lasagna, chili, and Spanish rice if low-fat ingredients and low-fat cooking techniques are used.  Avoid: Cream soups made with whole milk, cream, or high-fat cheese. All other soups. Desserts and Sweets  Allowed: Sherbet, fruit ices, gelatins, meringues, and angel food cake. Homemade desserts with recommended fats, oils, and milk products. Jam, jelly, honey, marmalade, sugars, and syrups. Pure sugar candy, such as gum drops, hard candy, jelly beans, marshmallows, mints, and small amounts of dark chocolate.  Avoid: Commercially prepared cakes, pies, cookies, frosting, pudding, or mixes for these products. Desserts containing whole milk products, chocolate, coconut, lard, palm oil, or palm kernel oil. Ice cream or ice cream drinks. Candy that contains chocolate, coconut, butter, hydrogenated fat, or unknown ingredients. Buttered syrups. Fats and Oils  Allowed: Vegetable oils: safflower, sunflower, corn, soybean, cottonseed, sesame, canola, olive, or peanut. Non-hydrogenated margarines. Salad dressing or mayonnaise: homemade or commercial, made with a recommended oil. Low or nonfat salad dressing or mayonnaise.  Limit added fats and oils to 6 to 8 tsp per day (includes fats used in cooking, baking, salads, and spreads on bread). Remember to count the "hidden fats" in foods.  Avoid: Solid fats and shortenings: butter, lard, salt pork, bacon drippings. Gravy containing meat fat, shortening, or suet. Cocoa butter, coconut. Coconut oil, palm oil, palm kernel oil, or hydrogenated oils: these ingredients are often used in bakery products, nondairy creamers, whipped  toppings, candy, and commercially fried foods. Read labels carefully. Salad dressings made of unknown oils, sour cream, or cheese, such as blue cheese and Roquefort. Cream, all kinds: half-and-half, light, heavy, or whipping. Sour cream or cream cheese (even if "light" or low-fat). Nondairy cream substitutes: coffee creamers and sour cream substitutes made with palm, palm kernel, hydrogenated oils, or coconut oil. Beverages  Allowed: Coffee (regular or decaffeinated), tea. Diet carbonated beverages, mineral water. Alcohol: Check with your caregiver. Moderation is recommended.  Avoid: Whole milk, regular sodas, and juice drinks with added sugar. Condiments  Allowed: All seasonings and condiments. Cocoa powder. "Cream" sauces made with recommended ingredients.  Avoid: Carob powder made with  hydrogenated fats. SAMPLE MENU Breakfast   cup orange juice   cup oatmeal  1 slice toast  1 tsp margarine  1 cup skim milk Lunch  [place] sandwich with 2 oz [place], 2 slices bread  Lettuce and tomato slices  Fresh fruit  Carrot sticks  Coffee or tea Snack  Fresh fruit or low-fat crackers Dinner  3 oz lean ground beef  1 baked potato  1 tsp margarine   cup asparagus  Lettuce salad  1 tbs non-creamy dressing   cup peach slices  1 cup skim milk Document Released: 08/10/2008 Document Revised: 05/02/2012 Document Reviewed: 01/01/2014 [employer] Patient Information 2015 [place], [place]. This information is not intended to replace advice given to you by your health care provider. Make sure you discuss any questions you have with your health care provider.

## 2014-10-08 ENCOUNTER — Ambulatory Visit (INDEPENDENT_AMBULATORY_CARE_PROVIDER_SITE_OTHER): Payer: Medicare Other

## 2014-10-08 ENCOUNTER — Ambulatory Visit (INDEPENDENT_AMBULATORY_CARE_PROVIDER_SITE_OTHER): Payer: Medicare Other | Admitting: *Deleted

## 2014-10-08 VITALS — BP 182/86 | Ht 62.0 in | Wt 198.5 lb

## 2014-10-08 DIAGNOSIS — I10 Essential (primary) hypertension: Secondary | ICD-10-CM | POA: Diagnosis not present

## 2014-10-08 LAB — MEASURE BLOOD PRESSURE

## 2014-10-08 MED ORDER — CLONIDINE HCL 0.1 MG PO TABS: 0.1000 mg | ORAL_TABLET | Freq: Two times a day (BID) | ORAL | Status: DC

## 2014-10-08 NOTE — Progress Notes (Signed)
Patient stated BP has been elevated since MD changed BP patient stated cardiology changed BP medication 09/30/14 form Valsartan/ HCTZ to Amlodipine and Losartan . Patient also seen at [facility] [place] for hypertension over the weekend.  Sent result to MD.

## 2014-10-08 NOTE — Progress Notes (Addendum)
1.) Reason for visit: BP check  2.) Name of MD requesting visit: Dr. Rockey Situ  3.) H&P: Pt brings list of recent BP readings:  11/17 154/84  11/18  140/110   176/99   181/99 at [facility]   184/91 at [employer]   169/84 at [facility]   159/79 at home  11/19  177/97 at [facility]   164/90   190/87 at [facility]  Today  190/92  4.) ROS related to problem: Pt reports that she did not follow directions at last ov of taking 1/2 amlodipine and has been taking a whole pill since that time.  She states that she is going on a cruise next week and is concerned that her BP will not be under control before that time.  5.) Assessment and plan per MD: Discussed w/ pt to limit the amount of times that she is checking her BP, as she admits that she feels quite a bit of anxiety of it.  She states that she checks it at home, will be worried that her monitor is incorrect, so she will go to [place] and then [facility] to compare to their monitors.  She is agreeable to checking it about 3 times a week, up to once daily if she is symptomatic. Advised her that Dr. Rockey Situ will review her BP readings and I will call her today w/ his recommendation.  She is appreciative of this.   Per Dr. Rockey Situ:   Start clonidine 0.1 mg BID       Ok to take an extra if needed       Call Monday w/ BP readings        Advised pt that she may experience dry mouth associated w/ clonidine and not to drink additional fluids to avoid fluid overload.  She verbalizes understanding and states that she will stop the amlodipine, as she does not feel that this is helping her at all.

## 2014-10-08 NOTE — Addendum Note (Signed)
Addended by: Dede Query R on: 10/08/2014 10:57 AM   Modules accepted: Orders, Medications

## 2014-10-08 NOTE — Progress Notes (Signed)
Recommended she start clonidine 0.1 mg twice a day

## 2014-10-08 NOTE — Patient Instructions (Signed)
Dr. Rockey Situ will review your numbers and I will call you today with his recommendation.

## 2014-10-13 ENCOUNTER — Emergency Department: Payer: Self-pay | Admitting: Emergency Medicine

## 2014-10-13 DIAGNOSIS — Z880 Allergy status to penicillin: Secondary | ICD-10-CM | POA: Diagnosis not present

## 2014-10-13 DIAGNOSIS — Z720 Tobacco use: Secondary | ICD-10-CM | POA: Diagnosis not present

## 2014-10-13 DIAGNOSIS — R51 Headache: Secondary | ICD-10-CM | POA: Diagnosis not present

## 2014-10-13 DIAGNOSIS — I10 Essential (primary) hypertension: Secondary | ICD-10-CM | POA: Diagnosis not present

## 2014-10-13 DIAGNOSIS — J0130 Acute sphenoidal sinusitis, unspecified: Secondary | ICD-10-CM | POA: Diagnosis not present

## 2014-10-13 DIAGNOSIS — E119 Type 2 diabetes mellitus without complications: Secondary | ICD-10-CM | POA: Diagnosis not present

## 2014-10-13 DIAGNOSIS — I6782 Cerebral ischemia: Secondary | ICD-10-CM | POA: Diagnosis not present

## 2014-10-14 ENCOUNTER — Telehealth: Payer: Self-pay

## 2014-10-14 NOTE — Telephone Encounter (Signed)
If blood pressure continues to run high Could double the clonidine up to 0.2 mg twice a day  Would wait several days, if blood pressure continues to run more than 140 on a regular basis, Could give a prescription for Cardura 8 mg every evening (would start one half pill for the first few days, increase only to 8 mg if needed)

## 2014-10-14 NOTE — Telephone Encounter (Signed)
Pt states she was in the ED over the weekend for her high BP. States she is going on a cruise on Sat and doesn't feel comfortable with her BP this high. Please call.

## 2014-10-14 NOTE — Telephone Encounter (Signed)
Pt calling about meds and bp being high

## 2014-10-14 NOTE — Telephone Encounter (Signed)
Spoke w/ pt.  She states that she is becoming upset, as she is going on a cruise on Sat and needs her BP to be under control. Advised pt that Dr. Rockey Situ will review phone notes after clinic. She is appreciative, but states that she wants something done ASAP, even if it is just to get her through the cruise.  Please advise.  Thank you.

## 2014-10-14 NOTE — Telephone Encounter (Signed)
Spoke w/ pt.  She reports that the current dose of clonidine 0.1 mg knocks her completely out and she has not taken this since last week.  Reports that she went back to taking diovan.  Reports that she previously had cramps while on this, but has not had any further issues since eating 1/2 a banana each night.  Offered to send rx for Cardura, but pt declines, as she does not want to start a new med before going on her cruise.  She would like to know if she can double the amount of Diovan she takes.  Please advise.  Thank you.

## 2014-10-15 MED ORDER — DOXAZOSIN MESYLATE 4 MG PO TABS: 4.0000 mg | ORAL_TABLET | Freq: Every evening | ORAL | Status: DC

## 2014-10-15 MED ORDER — VALSARTAN-HYDROCHLOROTHIAZIDE 320-25 MG PO TABS: 1.0000 | ORAL_TABLET | Freq: Every day | ORAL | Status: DC

## 2014-10-15 NOTE — Telephone Encounter (Signed)
Spoke w/ pt.  Advised her of Dr. Donivan Scull recommendation. She verbalizes understanding and would like a refill sent in on her diovan and a new rx sent in for cardura.  Pt will call back w/ any further questions or concerns.

## 2014-10-15 NOTE — Telephone Encounter (Signed)
If she is back on Diovan HCTZ 320/25 mg daily, would hold his losartan Okay to take with potassium or banana for cramping Stay on amlodipine 10 mg daily  If blood pressure continues to run high, Could take clonidine twice a day If this causes side effects Could change to Cardura 4 mg up to 8 mg in the evening  Unable to increase the Diovan to a higher dose Unable to increase the amlodipine to a higher dose

## 2014-10-29 ENCOUNTER — Ambulatory Visit: Payer: Medicare Other | Admitting: Cardiovascular Disease

## 2014-11-05 ENCOUNTER — Encounter: Payer: Self-pay | Admitting: Internal Medicine

## 2014-11-05 ENCOUNTER — Ambulatory Visit (INDEPENDENT_AMBULATORY_CARE_PROVIDER_SITE_OTHER): Payer: Medicare Other | Admitting: Internal Medicine

## 2014-11-05 VITALS — BP 140/82 | HR 83 | Temp 98.7°F | Resp 16 | Ht 62.0 in | Wt 198.2 lb

## 2014-11-05 DIAGNOSIS — I2510 Atherosclerotic heart disease of native coronary artery without angina pectoris: Secondary | ICD-10-CM | POA: Diagnosis not present

## 2014-11-05 DIAGNOSIS — N289 Disorder of kidney and ureter, unspecified: Secondary | ICD-10-CM

## 2014-11-05 DIAGNOSIS — Z23 Encounter for immunization: Secondary | ICD-10-CM | POA: Diagnosis not present

## 2014-11-05 DIAGNOSIS — E785 Hyperlipidemia, unspecified: Secondary | ICD-10-CM | POA: Diagnosis not present

## 2014-11-05 DIAGNOSIS — Z13820 Encounter for screening for osteoporosis: Secondary | ICD-10-CM | POA: Diagnosis not present

## 2014-11-05 DIAGNOSIS — Z1239 Encounter for other screening for malignant neoplasm of breast: Secondary | ICD-10-CM

## 2014-11-05 DIAGNOSIS — I10 Essential (primary) hypertension: Secondary | ICD-10-CM

## 2014-11-05 LAB — HM DIABETES FOOT EXAM: HM Diabetic Foot Exam: ABNORMAL

## 2014-11-05 MED ORDER — DOXAZOSIN MESYLATE 1 MG PO TABS: 1.0000 mg | ORAL_TABLET | Freq: Every day | ORAL | Status: DC

## 2014-11-05 MED ORDER — FUROSEMIDE 40 MG PO TABS: 40.0000 mg | ORAL_TABLET | Freq: Every day | ORAL | Status: DC

## 2014-11-05 NOTE — Progress Notes (Signed)
Patient ID: Andrea Stewart, female   DOB: 1946-11-02, 68 y.o.   MRN: [id number]  Patient Active Problem List   Diagnosis Date Noted  . HTN (hypertension)   . Hx of left heart catheterization by cutdown   . Single kidney 09/30/2014  . History of smoking 09/30/2014  . Encounter for preventive health examination 03/03/2014  . History of anemia 03/03/2014  . Right leg pain 01/28/2014  . Irritable bowel syndrome 09/09/2012  . CHEST PAIN UNSPECIFIED 08/25/2009  . Coronary atherosclerosis 07/26/2009  . Diabetes mellitus type 2 with complications, uncontrolled 03/09/2007  . Morbid obesity 03/09/2007  . DEPRESSION 03/09/2007  . PERIPHERAL NEUROPATHY 03/09/2007  . Essential hypertension 03/09/2007  . DEGENERATIVE DISC DISEASE 03/09/2007  . BACK PAIN, CHRONIC 03/09/2007  . PLANTAR FASCIITIS 03/09/2007  . FIBROMYALGIA 03/09/2007  . OSTEOPENIA 03/09/2007    Subjective:  CC:   Chief Complaint  Patient presents with  . Annual Exam    HPI:   Andrea Stewart is a 68 y.o. female who presents for  her annual Medicare wellness examination and management of other chronic and acute problems, but her wellness exam was documented in april 2015.  Uncontrolled HTN:  She has been having considerable difficulty managing labile hypertension since early NOvember .  She saw  Dr Rockey Situ Nov 16th,  BP was elevated andher medication was changed.  Several days later was treated in the [facility] ER for elevated BP.  Then had another ER visit at [facility] several days later for same. She then went on a cruise  With her extended family on Dec 6th ,  And during the cruise developed progressive peripheral edema which resulted in a trip to the cruise ship ER and was treated by the ship's hospitalist with lasix 40 mg once daily x 4 days.  She reports only a  mild change in edema until she returned home  on  Dec 12th and took an additional dose of husband Roy's furosemide, same dose which reportedly resolved her edema.      Her current regimen is diovanhct and cardura 4 mg,  but she has reduced cardura to 2 mg bc it was making her feel bad . She did not tolerate losartan,  Amlodipine was stopped for unclear reasons. Clonidine made her too sleepy.      Past Medical History  Diagnosis Date  . Myalgia and myositis, unspecified   . Plantar fascial fibromatosis   . Personal history of tobacco use, presenting hazards to health     1/2 ppd  . Degeneration of intervertebral disc, site unspecified   . Backache, unspecified     s/p c-spine and l-spine fusions  . Obesity, unspecified   . Disorder of bone and cartilage, unspecified   . Unspecified hereditary and idiopathic peripheral neuropathy     secondary to diabetes  . Unspecified essential hypertension   . Type II or unspecified type diabetes mellitus without mention of complication, not stated as uncontrolled   . Depressive disorder, not elsewhere classified   . GERD (gastroesophageal reflux disease)   . S/p nephrectomy   . S/P cholecystectomy   . Hx of left heart catheterization by cutdown     a. 7 years ago; no significant cad; b. Lexiscan 2014: no significant ischemia, no significant EKG changes concerning for ischemia, EF 67%, overall low risk study  . HTN (hypertension)     Past Surgical History  Procedure Laterality Date  . Appendectomy    . Carpal tunnel release    .  Cholecystectomy    . Vesicovaginal fistula closure w/ tah    . Replacement total knee  10/2004  . Foot surgery      x 3  . Elbow surgery    . Trigger finger surgery    . Kidney donation  83  . Cervical discectomy    . Lumbar fusion    . Colonoscopy  09/1999    colonoscopy-hemorrhoids, re-check 5 years (10/2006)  . Cardiac catheterization      o.k. 2003  . Dexa      osteopenia (01/2004)  . Neck surgery      06/2005  . Back surgery      2006  . Problem with general anesthesia      02/2006  . Abdominal hysterectomy         The following portions of the  patient's history were reviewed and updated as appropriate: Allergies, current medications, and problem list.    Review of Systems:   Patient denies headache, fevers, malaise, unintentional weight loss, skin rash, eye pain, sinus congestion and sinus pain, sore throat, dysphagia,  hemoptysis , cough, dyspnea, wheezing, chest pain, palpitations, orthopnea, edema, abdominal pain, nausea, melena, diarrhea, constipation, flank pain, dysuria, hematuria, urinary  Frequency, nocturia, numbness, tingling, seizures,  Focal weakness, Loss of consciousness,  Tremor, insomnia, depression, anxiety, and suicidal ideation.     History   Social History  . Marital Status: Married    Spouse Name: N/A    Number of Children: 4  . Years of Education: N/A   Occupational History  . Not on file.   Social History Main Topics  . Smoking status: Former Smoker -- 1.00 packs/day    Types: Cigarettes    Quit date: 08/31/2011  . Smokeless tobacco: Never Used  . Alcohol Use: No  . Drug Use: No  . Sexual Activity: Not on file   Other Topics Concern  . Not on file   Social History Narrative   Lives in [place]. Disability because of back    Objective:  Filed Vitals:   11/05/14 1415  BP: 140/82  Pulse: 83  Temp: 98.7 F (37.1 C)  Resp: 16     General appearance: alert, cooperative and appears stated age Ears: normal TM's and external ear canals both ears Throat: lips, mucosa, and tongue normal; teeth and gums normal Neck: no adenopathy, no carotid bruit, supple, symmetrical, trachea midline and thyroid not enlarged, symmetric, no tenderness/mass/nodules Back: symmetric, no curvature. ROM normal. No CVA tenderness. Lungs: clear to auscultation bilaterally Heart: regular rate and rhythm, S1, S2 normal, no murmur, click, rub or gallop Abdomen: soft, non-tender; bowel sounds normal; no masses,  no organomegaly Pulses: 2+ and symmetric.  Skin: Skin color, texture, turgor normal. No rashes or  lesions. trace pitting edema. Lymph nodes: Cervical, supraclavicular, and axillary nodes normal.  Assessment and Plan:  Essential hypertension She has multiple intolerances including clonidine, losartan and cardura, and amlodipine. I  have reduced the cardura to 1 mg daily and recommended that at follow up with cardiology PA next week she have a 24 hour ambulatory BP study to document the dramatic wings in BP she is reporting. Prn furosemide.   Morbid obesity I have addressed  BMI and recommended a low glycemic index diet utilizing smaller more frequent meals to increase metabolism.  She claims to follow the diet without any significant improvement in weight, likely due to unrecongized/unacknowledged dietary indiscretions and lack of daily exercise.  I have also recommended that patient start exercising  with a goal of 30 minutes of aerobic exercise a minimum of 5 days per week.   Updated Medication List Outpatient Encounter Prescriptions as of 11/05/2014  Medication Sig  . albuterol (PROVENTIL HFA) 108 (90 BASE) MCG/ACT inhaler Inhale 2 puffs into the lungs every 4 (four) hours as needed for wheezing or shortness of breath.   Marland Kitchen aspirin 81 MG tablet Take 1 tablet (81 mg total) by mouth daily.  . benzonatate (TESSALON) 200 MG capsule Take 1 capsule (200 mg total) by mouth 3 (three) times daily as needed for cough.  . calcium carbonate (OS-CAL) 600 MG TABS Take 600 mg by mouth daily.    . cloNIDine (CATAPRES) 0.1 MG tablet Take 1 tablet (0.1 mg total) by mouth 2 (two) times daily.  . cyclobenzaprine (FLEXERIL) 10 MG tablet   . DAILY MULTIPLE VITAMINS PO Take 1 tablet by mouth daily.    . fluticasone (VERAMYST) 27.5 MCG/SPRAY nasal spray Place 2 sprays into the nose as needed for rhinitis or allergies.   . Fluticasone-Salmeterol (ADVAIR DISKUS) 250-50 MCG/DOSE AEPB Inhale 1 puff into the lungs as needed (bronchitis symptoms).   . insulin lispro (HUMALOG) 100 UNIT/ML injection Inject into the skin  as directed. Sliding scale 100-150 = 2-2.5 units 150-200 = 3-3.5 units 200-250 = 4-4.5 units 251-300 = 5-5.5 units 301-350 = 6-6.5 units 350-400 = 7-7.5 units  . insulin NPH (HUMULIN N,NOVOLIN N) 100 UNIT/ML injection Inject 20 Units into the skin daily before breakfast. 22 units at bedtime  . metFORMIN (GLUCOPHAGE-XR) 500 MG 24 hr tablet Take 500 mg by mouth 2 (two) times daily.   . methocarbamol (ROBAXIN) 500 MG tablet Take 500 mg by mouth every 6 (six) hours as needed for muscle spasms.   . Misc Natural Products (TURMERIC CURCUMIN) CAPS Take 1 capsule by mouth daily.  . mometasone (NASONEX) 50 MCG/ACT nasal spray Place 2 sprays into the nose daily.    . nitroGLYCERIN (NITROSTAT) 0.4 MG SL tablet Place 1 tablet (0.4 mg total) under the tongue every 5 (five) minutes as needed. Maximum 3 tablets (Patient taking differently: Place 0.4 mg under the tongue every 5 (five) minutes as needed for chest pain. Maximum 3 tablets)  . omeprazole (PRILOSEC) 40 MG capsule TAKE ONE CAPSULE TWICE A DAY  . sodium chloride (OCEAN) 0.65 % SOLN nasal spray Place 2 sprays into both nostrils as needed for congestion.  . traMADol (ULTRAM) 50 MG tablet Take 50 mg by mouth every 8 (eight) hours as needed for moderate pain or severe pain.  . valsartan-hydrochlorothiazide (DIOVAN-HCT) 320-25 MG per tablet Take 1 tablet by mouth daily. (Patient taking differently: Take 0.5 tablets by mouth 2 (two) times daily. )  . zolpidem (AMBIEN) 10 MG tablet Take 10 mg by mouth at bedtime as needed.    . [DISCONTINUED] doxazosin (CARDURA) 4 MG tablet Take 1 tablet (4 mg total) by mouth every evening.  . [DISCONTINUED] omeprazole (PRILOSEC) 40 MG capsule Take 40 mg by mouth daily.  . [DISCONTINUED] traMADol (ULTRAM) 50 MG tablet TAKE ONE TABLET EVERY EIGHT (8) HOURS ASNEEDED FOR PAIN (Patient not taking: Reported on 11/06/2014)  . doxazosin (CARDURA) 1 MG tablet Take 1 tablet (1 mg total) by mouth daily.  . [DISCONTINUED] furosemide  (LASIX) 40 MG tablet Take 1 tablet (40 mg total) by mouth daily. (Patient taking differently: Take 40 mg by mouth daily as needed. )     Orders Placed This Encounter  Procedures  . DG Bone Density  . MM DIGITAL  SCREENING BILATERAL  . Tdap vaccine greater than or equal to 7yo IM  . Comprehensive metabolic panel  . Lipid panel    No Follow-up on file.

## 2014-11-05 NOTE — Patient Instructions (Addendum)
I am reducing the  cardura (doxazosin)  to 1 mg daily   rx sent to your pharmacy  I am adding furosemide 40 mg daily as needed for fluid retention   Talk to PA about a 24 hour ambulatory BP monitor  Take hour home BP machine to Dr Donivan Scull office and let them   Compare and calibrate it  Lab work next week  Here  To follow your potassium and kidney function   You had your annual Medicare wellness exam today  We will schedule your mammogram and your bone density test at [facility] soon.   You received the TDaP  vaccine today.  We will contact you with the bloodwork results  Health Maintenance Adopting a healthy lifestyle and getting preventive care can go a long way to promote health and wellness. Talk with your health care provider about what schedule of regular examinations is right for you. This is a good chance for you to check in with your provider about disease prevention and staying healthy. In between checkups, there are plenty of things you can do on your own. Experts have done a lot of research about which lifestyle changes and preventive measures are most likely to keep you healthy. Ask your health care provider for more information. WEIGHT AND DIET  Eat a healthy diet  Be sure to include plenty of vegetables, fruits, low-fat dairy products, and lean protein.  Do not eat a lot of foods high in solid fats, added sugars, or salt.  Get regular exercise. This is one of the most important things you can do for your health.  Most adults should exercise for at least 150 minutes each week. The exercise should increase your heart rate and make you sweat (moderate-intensity exercise).  Most adults should also do strengthening exercises at least twice a week. This is in addition to the moderate-intensity exercise.  Maintain a healthy weight  Body mass index (BMI) is a measurement that can be used to identify possible weight problems. It estimates body fat based on height and weight. Your  health care provider can help determine your BMI and help you achieve or maintain a healthy weight.  For females 4 years of age and older:   A BMI below 18.5 is considered underweight.  A BMI of 18.5 to 24.9 is normal.  A BMI of 25 to 29.9 is considered overweight.  A BMI of 30 and above is considered obese.  Watch levels of cholesterol and blood lipids  You should start having your blood tested for lipids and cholesterol at 69 years of age, then have this test every 5 years.  You may need to have your cholesterol levels checked more often if:  Your lipid or cholesterol levels are high.  You are older than 69 years of age.  You are at high risk for heart disease.  CANCER SCREENING   Lung Cancer  Lung cancer screening is recommended for adults 45-55 years old who are at high risk for lung cancer because of a history of smoking.  A yearly low-dose CT scan of the lungs is recommended for people who:  Currently smoke.  Have quit within the past 15 years.  Have at least a 30-pack-year history of smoking. A pack year is smoking an average of one pack of cigarettes a day for 1 year.  Yearly screening should continue until it has been 15 years since you quit.  Yearly screening should stop if you develop a health problem that would  prevent you from having lung cancer treatment.  Breast Cancer  Practice breast self-awareness. This means understanding how your breasts normally appear and feel.  It also means doing regular breast self-exams. Let your health care provider know about any changes, no matter how small.  If you are in your 20s or 30s, you should have a clinical breast exam (CBE) by a health care provider every 1-3 years as part of a regular health exam.  If you are 38 or older, have a CBE every year. Also consider having a breast X-ray (mammogram) every year.  If you have a family history of breast cancer, talk to your health care provider about genetic  screening.  If you are at high risk for breast cancer, talk to your health care provider about having an MRI and a mammogram every year.  Breast cancer gene (BRCA) assessment is recommended for women who have family members with BRCA-related cancers. BRCA-related cancers include:  Breast.  Ovarian.  Tubal.  Peritoneal cancers.  Results of the assessment will determine the need for genetic counseling and BRCA1 and BRCA2 testing. Cervical Cancer Routine pelvic examinations to screen for cervical cancer are no longer recommended for nonpregnant women who are considered low risk for cancer of the pelvic organs (ovaries, uterus, and vagina) and who do not have symptoms. A pelvic examination may be necessary if you have symptoms including those associated with pelvic infections. Ask your health care provider if a screening pelvic exam is right for you.   The Pap test is the screening test for cervical cancer for women who are considered at risk.  If you had a hysterectomy for a problem that was not cancer or a condition that could lead to cancer, then you no longer need Pap tests.  If you are older than 65 years, and you have had normal Pap tests for the past 10 years, you no longer need to have Pap tests.  If you have had past treatment for cervical cancer or a condition that could lead to cancer, you need Pap tests and screening for cancer for at least 20 years after your treatment.  If you no longer get a Pap test, assess your risk factors if they change (such as having a new sexual partner). This can affect whether you should start being screened again.  Some women have medical problems that increase their chance of getting cervical cancer. If this is the case for you, your health care provider may recommend more frequent screening and Pap tests.  The human papillomavirus (HPV) test is another test that may be used for cervical cancer screening. The HPV test looks for the virus that can  cause cell changes in the cervix. The cells collected during the Pap test can be tested for HPV.  The HPV test can be used to screen women 97 years of age and older. Getting tested for HPV can extend the interval between normal Pap tests from three to five years.  An HPV test also should be used to screen women of any age who have unclear Pap test results.  After 69 years of age, women should have HPV testing as often as Pap tests.  Colorectal Cancer  This type of cancer can be detected and often prevented.  Routine colorectal cancer screening usually begins at 69 years of age and continues through 69 years of age.  Your health care provider may recommend screening at an earlier age if you have risk factors for colon cancer.  Your health care provider may also recommend using home test kits to check for hidden blood in the stool.  A small camera at the end of a tube can be used to examine your colon directly (sigmoidoscopy or colonoscopy). This is done to check for the earliest forms of colorectal cancer.  Routine screening usually begins at age 40.  Direct examination of the colon should be repeated every 5-10 years through 69 years of age. However, you may need to be screened more often if early forms of precancerous polyps or small growths are found. Skin Cancer  Check your skin from head to toe regularly.  Tell your health care provider about any new moles or changes in moles, especially if there is a change in a mole's shape or color.  Also tell your health care provider if you have a mole that is larger than the size of a pencil eraser.  Always use sunscreen. Apply sunscreen liberally and repeatedly throughout the day.  Protect yourself by wearing long sleeves, pants, a wide-brimmed hat, and sunglasses whenever you are outside. HEART DISEASE, DIABETES, AND HIGH BLOOD PRESSURE   Have your blood pressure checked at least every 1-2 years. High blood pressure causes heart  disease and increases the risk of stroke.  If you are between 35 years and 48 years old, ask your health care provider if you should take aspirin to prevent strokes.  Have regular diabetes screenings. This involves taking a blood sample to check your fasting blood sugar level.  If you are at a normal weight and have a low risk for diabetes, have this test once every three years after 69 years of age.  If you are overweight and have a high risk for diabetes, consider being tested at a younger age or more often. PREVENTING INFECTION  Hepatitis B  If you have a higher risk for hepatitis B, you should be screened for this virus. You are considered at high risk for hepatitis B if:  You were born in a country where hepatitis B is common. Ask your health care provider which countries are considered high risk.  Your parents were born in a high-risk country, and you have not been immunized against hepatitis B (hepatitis B vaccine).  You have HIV or AIDS.  You use needles to inject street drugs.  You live with someone who has hepatitis B.  You have had sex with someone who has hepatitis B.  You get hemodialysis treatment.  You take certain medicines for conditions, including cancer, organ transplantation, and autoimmune conditions. Hepatitis C  Blood testing is recommended for:  Everyone born from [birth year] through 1966.  Anyone with known risk factors for hepatitis C. Sexually transmitted infections (STIs)  You should be screened for sexually transmitted infections (STIs) including gonorrhea and chlamydia if:  You are sexually active and are younger than 69 years of age.  You are older than 69 years of age and your health care provider tells you that you are at risk for this type of infection.  Your sexual activity has changed since you were last screened and you are at an increased risk for chlamydia or gonorrhea. Ask your health care provider if you are at risk.  If you do not have  HIV, but are at risk, it may be recommended that you take a prescription medicine daily to prevent HIV infection. This is called pre-exposure prophylaxis (PrEP). You are considered at risk if:  You are sexually active and do not regularly use  condoms or know the HIV status of your partner(s).  You take drugs by injection.  You are sexually active with a partner who has HIV. Talk with your health care provider about whether you are at high risk of being infected with HIV. If you choose to begin PrEP, you should first be tested for HIV. You should then be tested every 3 months for as long as you are taking PrEP.  PREGNANCY   If you are premenopausal and you may become pregnant, ask your health care provider about preconception counseling.  If you may become pregnant, take 400 to 800 micrograms (mcg) of folic acid every day.  If you want to prevent pregnancy, talk to your health care provider about birth control (contraception). OSTEOPOROSIS AND MENOPAUSE   Osteoporosis is a disease in which the bones lose minerals and strength with aging. This can result in serious bone fractures. Your risk for osteoporosis can be identified using a bone density scan.  If you are 55 years of age or older, or if you are at risk for osteoporosis and fractures, ask your health care provider if you should be screened.  Ask your health care provider whether you should take a calcium or vitamin D supplement to lower your risk for osteoporosis.  Menopause may have certain physical symptoms and risks.  Hormone replacement therapy may reduce some of these symptoms and risks. Talk to your health care provider about whether hormone replacement therapy is right for you.  HOME CARE INSTRUCTIONS   Schedule regular health, dental, and eye exams.  Stay current with your immunizations.   Do not use any tobacco products including cigarettes, chewing tobacco, or electronic cigarettes.  If you are pregnant, do not  drink alcohol.  If you are breastfeeding, limit how much and how often you drink alcohol.  Limit alcohol intake to no more than 1 drink per day for nonpregnant women. One drink equals 12 ounces of beer, 5 ounces of wine, or 1 ounces of hard liquor.  Do not use street drugs.  Do not share needles.  Ask your health care provider for help if you need support or information about quitting drugs.  Tell your health care provider if you often feel depressed.  Tell your health care provider if you have ever been abused or do not feel safe at home. Document Released: 05/17/2011 Document Revised: 03/18/2014 Document Reviewed: 10/03/2013 [employer] Patient Information 2015 [place], [place]. This information is not intended to replace advice given to you by your health care provider. Make sure you discuss any questions you have with your health care provider.

## 2014-11-05 NOTE — Progress Notes (Signed)
Pre visit review using our clinic review tool, if applicable. No additional management support is needed unless otherwise documented below in the visit note. 

## 2014-11-06 ENCOUNTER — Ambulatory Visit (INDEPENDENT_AMBULATORY_CARE_PROVIDER_SITE_OTHER): Payer: Medicare Other | Admitting: Physician Assistant

## 2014-11-06 ENCOUNTER — Encounter: Payer: Self-pay | Admitting: Physician Assistant

## 2014-11-06 ENCOUNTER — Ambulatory Visit: Payer: Medicare Other | Admitting: Cardiovascular Disease

## 2014-11-06 VITALS — BP 130/76 | HR 80 | Ht 59.0 in | Wt 195.0 lb

## 2014-11-06 DIAGNOSIS — Z905 Acquired absence of kidney: Secondary | ICD-10-CM

## 2014-11-06 DIAGNOSIS — Z87891 Personal history of nicotine dependence: Secondary | ICD-10-CM

## 2014-11-06 DIAGNOSIS — IMO0002 Reserved for concepts with insufficient information to code with codable children: Secondary | ICD-10-CM

## 2014-11-06 DIAGNOSIS — I10 Essential (primary) hypertension: Secondary | ICD-10-CM | POA: Diagnosis not present

## 2014-11-06 DIAGNOSIS — E118 Type 2 diabetes mellitus with unspecified complications: Secondary | ICD-10-CM

## 2014-11-06 DIAGNOSIS — I2510 Atherosclerotic heart disease of native coronary artery without angina pectoris: Secondary | ICD-10-CM

## 2014-11-06 DIAGNOSIS — R0602 Shortness of breath: Secondary | ICD-10-CM | POA: Diagnosis not present

## 2014-11-06 DIAGNOSIS — R5383 Other fatigue: Secondary | ICD-10-CM

## 2014-11-06 DIAGNOSIS — E1165 Type 2 diabetes mellitus with hyperglycemia: Secondary | ICD-10-CM

## 2014-11-06 DIAGNOSIS — Z9889 Other specified postprocedural states: Secondary | ICD-10-CM

## 2014-11-06 DIAGNOSIS — Z720 Tobacco use: Secondary | ICD-10-CM

## 2014-11-06 MED ORDER — FUROSEMIDE 40 MG PO TABS: 40.0000 mg | ORAL_TABLET | Freq: Every day | ORAL | Status: DC

## 2014-11-06 NOTE — Progress Notes (Signed)
Patient Name: Andrea Stewart, Andrea Stewart [birth year], MRN [id number]  Date of Encounter: 11/06/2014  Primary Care Provider:  Crecencio Mc, MD Primary Cardiologist:  Dr. Rockey Situ, MD  Patient Profile:  68 y.o. female with history of nonobstructive CAD by cath in 2008, hypertension, poorly controlled diabetes, prior smoking history, and obesity presents to clinic today for hypertension follow up.    Problem List:   Past Medical History  Diagnosis Date  . Myalgia and myositis, unspecified   . Plantar fascial fibromatosis   . Personal history of tobacco use, presenting hazards to health     1/2 ppd  . Degeneration of intervertebral disc, site unspecified   . Backache, unspecified     s/p c-spine and l-spine fusions  . Obesity, unspecified   . Disorder of bone and cartilage, unspecified   . Unspecified hereditary and idiopathic peripheral neuropathy     secondary to diabetes  . Unspecified essential hypertension   . Type II or unspecified type diabetes mellitus without mention of complication, not stated as uncontrolled   . Depressive disorder, not elsewhere classified   . GERD (gastroesophageal reflux disease)   . S/p nephrectomy   . S/P cholecystectomy   . Hx of left heart catheterization by cutdown     a. 7 years ago; no significant cad; b. Lexiscan 2014: no significant ischemia, no significant EKG changes concerning for ischemia, EF 67%, overall low risk study   Past Surgical History  Procedure Laterality Date  . Appendectomy    . Carpal tunnel release    . Cholecystectomy    . Vesicovaginal fistula closure w/ tah    . Replacement total knee  10/2004  . Foot surgery      x 3  . Elbow surgery    . Trigger finger surgery    . Kidney donation  51  . Cervical discectomy    . Lumbar fusion    . Colonoscopy  09/1999    colonoscopy-hemorrhoids, re-check 5 years (10/2006)  . Cardiac catheterization      o.k. 2003  . Dexa      osteopenia (01/2004)  . Neck surgery     06/2005  . Back surgery      2006  . Problem with general anesthesia      02/2006  . Abdominal hysterectomy       Allergies:  Allergies  Allergen Reactions  . Morphine Anaphylaxis  . Gabapentin Other (See Comments)    intolerance  . Nsaids Diarrhea  . Penicillins Other (See Comments)    REACTION: rash, swelling. Remote reaction as a child to Pen V K. Patient did tolerate recent course of augmentin      HPI:  68 y.o. female with the above problem list with history of nonobstructive CAD by cath 2008, hypertension, poorly controlled DM2, prior smoking history, and obesity. 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 Her blood sugars have previously run in the 400s - insulin pump was without help.   She has been having issues with labile hypertension since early November. On 11/16 BP was elevated at 152/82 and antihypertensives were changed from Diovan HCT to amlodipine 5 mg. She then seen in the [facility] ED for atypical chest pain on 11/2. After troponin negative x 2, EKG without acute changes she was discharged home with an improved BP of 150/66 from an initial BP of 163/79. She again went to the ED, this time the [facility] ED  visit for headache. CT of the headache revealed acute left sphenoid sinusitis and mild white matter changes suggestive chronic small vessel ischemic disease. She went on a cruise 12/6 - developed worsening peripheral edema on cruise and was treated by the ship's MD with Lasix 40 mg daily x 4 days. She reportedly saw mild change in edema. When she got home on 12/12 she took an additional dose of her husband's Lasix, which apparently resolved her edema.   She has previously not tolerated multiple antihypertensives: Diovan HCT (leg cramping) losartan (patient self discontinued after no BP response in a weekend - though it would take longer to see a response), valsartan (uncertain reason), amlodipine (uncertain reason after titration  up to 10 mg in Nov), and clonidine (fatigue). She is currently on Diovan HCT (previously stopped by cardiology) and Cardura 1 mg previously 4 mg and 2 mg, but this was reduced by PCP on 12/22. Lasix 40 mg prn was added on 12/22 by PCP.   She notes increased SOB and fatigue dating back to just before Thanksgiving. She is not certain but feels that she may have to take a break from walking in from a parking lot 2/2 SOB (she already has to take breaks 2/2 back pain and this limits her). No chest pain. She reports a weight in the 180s just prior to Thanksgiving and it has been going up since then (upon reviewing her record it appears to be stable if not inverse). No orthopnea or early satiety. No PND. No cough.    Home Medications:  Prior to Admission medications   Medication Sig Start Date End Date Taking? Authorizing Provider  albuterol (PROVENTIL HFA) 108 (90 BASE) MCG/ACT inhaler Inhale 2 puffs into the lungs every 4 (four) hours as needed for wheezing or shortness of breath.     Historical Provider, MD  aspirin 81 MG tablet Take 1 tablet (81 mg total) by mouth daily. 06/19/13   Minna Merritts, MD  benzonatate (TESSALON) 200 MG capsule Take 1 capsule (200 mg total) by mouth 3 (three) times daily as needed for cough. 10/30/13   Crecencio Mc, MD  calcium carbonate (OS-CAL) 600 MG TABS Take 600 mg by mouth daily.      Historical Provider, MD  cloNIDine (CATAPRES) 0.1 MG tablet Take 1 tablet (0.1 mg total) by mouth 2 (two) times daily. 10/08/14   Minna Merritts, MD  cyclobenzaprine (FLEXERIL) 10 MG tablet  07/04/14   Historical Provider, MD  DAILY MULTIPLE VITAMINS PO Take 1 tablet by mouth daily.      Historical Provider, MD  doxazosin (CARDURA) 1 MG tablet Take 1 tablet (1 mg total) by mouth daily. 11/05/14   Crecencio Mc, MD  fluticasone (VERAMYST) 27.5 MCG/SPRAY nasal spray Place 2 sprays into the nose as needed for rhinitis or allergies.     Historical Provider, MD  Fluticasone-Salmeterol  (ADVAIR DISKUS) 250-50 MCG/DOSE AEPB Inhale 1 puff into the lungs as needed (bronchitis symptoms).     Historical Provider, MD  furosemide (LASIX) 40 MG tablet Take 1 tablet (40 mg total) by mouth daily. 11/05/14   Crecencio Mc, MD  insulin lispro (HUMALOG) 100 UNIT/ML injection Inject into the skin as directed. Sliding scale 100-150 = 2-2.5 units 150-200 = 3-3.5 units 200-250 = 4-4.5 units 251-300 = 5-5.5 units 301-350 = 6-6.5 units 350-400 = 7-7.5 units    Historical Provider, MD  insulin NPH (HUMULIN N,NOVOLIN N) 100 UNIT/ML injection Inject 20 Units into the skin daily  before breakfast. 22 units at bedtime    Historical Provider, MD  metFORMIN (GLUCOPHAGE-XR) 500 MG 24 hr tablet Take 500 mg by mouth 2 (two) times daily.  10/26/13   Historical Provider, MD  methocarbamol (ROBAXIN) 500 MG tablet Take 500 mg by mouth every 6 (six) hours as needed for muscle spasms.     Historical Provider, MD  Misc Natural Products (TURMERIC CURCUMIN) CAPS Take 1 capsule by mouth daily.    Historical Provider, MD  mometasone (NASONEX) 50 MCG/ACT nasal spray Place 2 sprays into the nose daily.      Historical Provider, MD  nitroGLYCERIN (NITROSTAT) 0.4 MG SL tablet Place 1 tablet (0.4 mg total) under the tongue every 5 (five) minutes as needed. Maximum 3 tablets Patient taking differently: Place 0.4 mg under the tongue every 5 (five) minutes as needed for chest pain. Maximum 3 tablets 06/19/13   Minna Merritts, MD  omeprazole (PRILOSEC) 40 MG capsule TAKE ONE CAPSULE TWICE A DAY 08/06/14   Minna Merritts, MD  omeprazole (PRILOSEC) 40 MG capsule Take 40 mg by mouth daily.    Historical Provider, MD  sodium chloride (OCEAN) 0.65 % SOLN nasal spray Place 2 sprays into both nostrils as needed for congestion.    Historical Provider, MD  traMADol (ULTRAM) 50 MG tablet TAKE ONE TABLET EVERY EIGHT (8) HOURS ASNEEDED FOR PAIN 07/30/13   Crecencio Mc, MD  traMADol (ULTRAM) 50 MG tablet Take 50 mg by mouth every 8  (eight) hours as needed for moderate pain or severe pain.    Historical Provider, MD  valsartan-hydrochlorothiazide (DIOVAN-HCT) 320-25 MG per tablet Take 1 tablet by mouth daily. Patient taking differently: Take 0.5 tablets by mouth 2 (two) times daily.  10/15/14   Minna Merritts, MD  zolpidem (AMBIEN) 10 MG tablet Take 10 mg by mouth at bedtime as needed.      Historical Provider, MD     Weights: Filed Weights   11/06/14 1411  Weight: 195 lb (88.451 kg)    Wt Readings from Last 3 Encounters:  11/06/14 195 lb (88.451 kg)  11/05/14 198 lb 4 oz (89.926 kg)  10/08/14 198 lb 8 oz (90.039 kg)     Review of Systems:  As above.  All other systems reviewed and are otherwise negative except as noted above.  Physical Exam:  Blood pressure 130/76, pulse 80, height 4\' 11"  (1.499 m), weight 195 lb (88.451 kg).  General: Pleasant, NAD Psych: Normal affect. Neuro: Alert and oriented X 3. Moves all extremities spontaneously. HEENT: Normal  Neck: Supple without bruits or JVD. Lungs:  Resp regular and unlabored, CTA. Heart: RRR no s3, s4, or murmurs. Abdomen: Soft, non-tender, non-distended, BS + x 4.  Extremities: No clubbing, cyanosis, trace bilateral non-pitting edema to the mid-shin.    Assessment & Plan:  1. HTN: -Well controlled today -She was advised to obtain a new BP monitor as her current home monitor does not appear to be correctly calibrated. She should then bring these monitor into our office to verify it is accurate, once we have verified its accuracy she can check her BP several times per day at home with a log and bring those in to her follow up appointments. Should her BP remain elusive then could add 24 hour ambulatory home BP monitoring if patient desires though not sure this is indicated at this time.  -She appears to jump from one medication to another quite frequently on her own (some that she is currently  on she has previously listed intolerances - clonidine and  Diovan HCT).    2. SOB/fatigue: -Has been present since prior to Thanksgiving. She reports a weight at home in the 180s. All documented weights around that time (evening dating back years) do not indicate a weight that shows a sudden fluid retention. Pre-Thanksgiving she weighed 194, post Thanksgiving she weighed 198.  -Last nuclear stress test 2014 was without significant ischemia, EF 67% -Schedule echo -Lasix 40 mg daily for the next week -Check bmet today, she has a follow up bmet with her PCP in 1 week -Compression hose -Limit fluid and salt intake (she likes to drink lots of fluids and puts lots of sea salt on food - she was unaware that sea salt was still salt)  2. Poorly controlled diabetes: -Last A1C 9.6% from 01/08/2014 -Microalbumin 444.5 from 01/08/2014 -These are indicative of poorly controlled HTN, DM2, and possible predictors of CAD -Optimal DM2 control is important moving forward   3. Obesity: -Weight loss is a crucial component in her healthcare given her comorbidities    4. Unilateral kidney: -Check bmet today as above  5. History of tobacco abuse: -Remains tobacco free    Christell Faith, PA-C [place] [address] [place], [place] [address] [phone] [place] 11/06/2014, 4:46 PM

## 2014-11-06 NOTE — Patient Instructions (Signed)
Your physician has recommended you make the following change in your medication:  Increase Lasix to 40 mg once daily   Your physician recommends that you have labs today:  BMP   Your physician recommends that you return for lab work in:  Keep your lab appointment with PCP in 1 week  BMP  Your physician has requested that you have an echocardiogram. Echocardiography is a painless test that uses sound waves to create images of your heart. It provides your doctor with information about the size and shape of your heart and how well your heart's chambers and valves are working. This procedure takes approximately one hour. There are no restrictions for this procedure.  Wear compression hose as discussed   Your physician recommends that you schedule a follow-up appointment in:  With [facility] in 1 month

## 2014-11-07 LAB — BASIC METABOLIC PANEL

## 2014-11-07 NOTE — Assessment & Plan Note (Signed)
She has multiple intolerances including clonidine, losartan and cardura, and amlodipine. I  have reduced the cardura to 1 mg daily and recommended that at follow up with cardiology PA next week she have a 24 hour ambulatory BP study to document the dramatic wings in BP she is reporting. Prn furosemide.

## 2014-11-07 NOTE — Assessment & Plan Note (Signed)
I have addressed  BMI and recommended a low glycemic index diet utilizing smaller more frequent meals to increase metabolism.  She claims to follow the diet without any significant improvement in weight, likely due to unrecongized/unacknowledged dietary indiscretions and lack of daily exercise.  I have also recommended that patient start exercising with a goal of 30 minutes of aerobic exercise a minimum of 5 days per week.

## 2014-11-12 ENCOUNTER — Telehealth: Payer: Self-pay | Admitting: Internal Medicine

## 2014-11-12 ENCOUNTER — Other Ambulatory Visit: Payer: Medicare Other

## 2014-11-12 ENCOUNTER — Ambulatory Visit: Payer: Self-pay | Admitting: Nurse Practitioner

## 2014-11-12 ENCOUNTER — Ambulatory Visit (INDEPENDENT_AMBULATORY_CARE_PROVIDER_SITE_OTHER): Payer: Medicare Other | Admitting: Nurse Practitioner

## 2014-11-12 ENCOUNTER — Encounter: Payer: Self-pay | Admitting: Nurse Practitioner

## 2014-11-12 VITALS — BP 120/55 | HR 70 | Temp 99.1°F | Resp 14 | Ht 62.0 in | Wt 191.4 lb

## 2014-11-12 DIAGNOSIS — R0789 Other chest pain: Secondary | ICD-10-CM | POA: Diagnosis not present

## 2014-11-12 DIAGNOSIS — J984 Other disorders of lung: Secondary | ICD-10-CM | POA: Diagnosis not present

## 2014-11-12 DIAGNOSIS — I517 Cardiomegaly: Secondary | ICD-10-CM | POA: Diagnosis not present

## 2014-11-12 DIAGNOSIS — R05 Cough: Secondary | ICD-10-CM

## 2014-11-12 DIAGNOSIS — I2510 Atherosclerotic heart disease of native coronary artery without angina pectoris: Secondary | ICD-10-CM | POA: Diagnosis not present

## 2014-11-12 DIAGNOSIS — R0602 Shortness of breath: Secondary | ICD-10-CM | POA: Diagnosis not present

## 2014-11-12 DIAGNOSIS — R509 Fever, unspecified: Secondary | ICD-10-CM | POA: Diagnosis not present

## 2014-11-12 DIAGNOSIS — R059 Cough, unspecified: Secondary | ICD-10-CM | POA: Insufficient documentation

## 2014-11-12 MED ORDER — ALBUTEROL SULFATE HFA 108 (90 BASE) MCG/ACT IN AERS: 2.0000 | INHALATION_SPRAY | RESPIRATORY_TRACT | Status: DC | PRN

## 2014-11-12 MED ORDER — AZITHROMYCIN 250 MG PO TABS: ORAL_TABLET | ORAL | Status: DC

## 2014-11-12 NOTE — Telephone Encounter (Signed)
Patient has a CMET in as future this will cover potassium Right?

## 2014-11-12 NOTE — Progress Notes (Signed)
Pre visit review using our clinic review tool, if applicable. No additional management support is needed unless otherwise documented below in the visit note. 

## 2014-11-12 NOTE — Assessment & Plan Note (Signed)
Worsening. Rx for Z-pack, albuterol inhaler, and order for CXR at [facility]. Pt instructed to call us if worsening in 4 days. Instructed to seek emergency care if chest tightening worsens after use of inhaler.

## 2014-11-12 NOTE — Telephone Encounter (Signed)
yes

## 2014-11-12 NOTE — Progress Notes (Signed)
Subjective:    Patient ID: Andrea Stewart, female    DOB: 07-11-1946, 68 y.o.   MRN: [id number]  HPI [name] is a 68 yo female with a CC of cough and fatgiue.   1) Onset 5 days. Feels, body aches, HA, chest tightness, cough non-productive, eyes pressure.  Tried- Liquor at home with lemon/honey  Tylenol- not helpful    Review of Systems  Constitutional: Positive for chills, diaphoresis and fatigue. Negative for fever.  HENT: Positive for congestion. Negative for postnasal drip, rhinorrhea, sinus pressure, sneezing and sore throat.   Eyes: Negative for visual disturbance.  Respiratory: Positive for cough, chest tightness, shortness of breath and wheezing.   Cardiovascular: Negative for chest pain, palpitations and leg swelling.  Gastrointestinal: Positive for nausea. Negative for vomiting, diarrhea and rectal pain.  Musculoskeletal: Positive for myalgias.  Skin: Negative for rash.  Neurological: Positive for headaches.   Past Medical History  Diagnosis Date  . Myalgia and myositis, unspecified   . Plantar fascial fibromatosis   . Personal history of tobacco use, presenting hazards to health     1/2 ppd  . Degeneration of intervertebral disc, site unspecified   . Backache, unspecified     s/p c-spine and l-spine fusions  . Obesity, unspecified   . Disorder of bone and cartilage, unspecified   . Unspecified hereditary and idiopathic peripheral neuropathy     secondary to diabetes  . Unspecified essential hypertension   . Type II or unspecified type diabetes mellitus without mention of complication, not stated as uncontrolled   . Depressive disorder, not elsewhere classified   . GERD (gastroesophageal reflux disease)   . S/p nephrectomy   . S/P cholecystectomy   . Hx of left heart catheterization by cutdown     a. 7 years ago; no significant cad; b. Lexiscan 2014: no significant ischemia, no significant EKG changes concerning for ischemia, EF 67%, overall low risk  study  . HTN (hypertension)     History   Social History  . Marital Status: Married    Spouse Name: N/A    Number of Children: 4  . Years of Education: N/A   Occupational History  . Not on file.   Social History Main Topics  . Smoking status: Former Smoker -- 1.00 packs/day    Types: Cigarettes    Quit date: 08/31/2011  . Smokeless tobacco: Never Used  . Alcohol Use: No  . Drug Use: No  . Sexual Activity: Not on file   Other Topics Concern  . Not on file   Social History Narrative   Lives in [place]. Disability because of back    Past Surgical History  Procedure Laterality Date  . Appendectomy    . Carpal tunnel release    . Cholecystectomy    . Vesicovaginal fistula closure w/ tah    . Replacement total knee  10/2004  . Foot surgery      x 3  . Elbow surgery    . Trigger finger surgery    . Kidney donation  65  . Cervical discectomy    . Lumbar fusion    . Colonoscopy  09/1999    colonoscopy-hemorrhoids, re-check 5 years (10/2006)  . Cardiac catheterization      o.k. 2003  . Dexa      osteopenia (01/2004)  . Neck surgery      06/2005  . Back surgery      2006  . Problem with general anesthesia  02/2006  . Abdominal hysterectomy      Family History  Problem Relation Age of Onset  . Hypertension Mother   . Colon cancer Mother   . Cancer Mother     colon  . Coronary artery disease Mother   . Prostate cancer Father   . COPD Father   . Cancer Father     prostate  . Heart attack Brother 40  . Sleep apnea Brother     Allergies  Allergen Reactions  . Morphine Anaphylaxis  . Gabapentin Other (See Comments)    intolerance  . Nsaids Diarrhea  . Penicillins Other (See Comments)    REACTION: rash, swelling. Remote reaction as a child to Pen V K. Patient did tolerate recent course of augmentin     Current Outpatient Prescriptions on File Prior to Visit  Medication Sig Dispense Refill  . aspirin 81 MG tablet Take 1 tablet (81 mg total)  by mouth daily.    . benzonatate (TESSALON) 200 MG capsule Take 1 capsule (200 mg total) by mouth 3 (three) times daily as needed for cough. 60 capsule 1  . calcium carbonate (OS-CAL) 600 MG TABS Take 600 mg by mouth daily.      . cloNIDine (CATAPRES) 0.1 MG tablet Take 1 tablet (0.1 mg total) by mouth 2 (two) times daily. 60 tablet 11  . cyclobenzaprine (FLEXERIL) 10 MG tablet     . DAILY MULTIPLE VITAMINS PO Take 1 tablet by mouth daily.      Marland Kitchen doxazosin (CARDURA) 1 MG tablet Take 1 tablet (1 mg total) by mouth daily. 30 tablet 1  . fluticasone (VERAMYST) 27.5 MCG/SPRAY nasal spray Place 2 sprays into the nose as needed for rhinitis or allergies.     . Fluticasone-Salmeterol (ADVAIR DISKUS) 250-50 MCG/DOSE AEPB Inhale 1 puff into the lungs as needed (bronchitis symptoms).     . furosemide (LASIX) 40 MG tablet Take 1 tablet (40 mg total) by mouth daily. 30 tablet 6  . insulin lispro (HUMALOG) 100 UNIT/ML injection Inject into the skin as directed. Sliding scale 100-150 = 2-2.5 units 150-200 = 3-3.5 units 200-250 = 4-4.5 units 251-300 = 5-5.5 units 301-350 = 6-6.5 units 350-400 = 7-7.5 units    . insulin NPH (HUMULIN N,NOVOLIN N) 100 UNIT/ML injection Inject 20 Units into the skin daily before breakfast. 22 units at bedtime    . metFORMIN (GLUCOPHAGE-XR) 500 MG 24 hr tablet Take 500 mg by mouth 2 (two) times daily.     . methocarbamol (ROBAXIN) 500 MG tablet Take 500 mg by mouth every 6 (six) hours as needed for muscle spasms.     . Misc Natural Products (TURMERIC CURCUMIN) CAPS Take 1 capsule by mouth daily.    . mometasone (NASONEX) 50 MCG/ACT nasal spray Place 2 sprays into the nose daily.      . nitroGLYCERIN (NITROSTAT) 0.4 MG SL tablet Place 1 tablet (0.4 mg total) under the tongue every 5 (five) minutes as needed. Maximum 3 tablets (Patient taking differently: Place 0.4 mg under the tongue every 5 (five) minutes as needed for chest pain. Maximum 3 tablets) 25 tablet 6  . omeprazole  (PRILOSEC) 40 MG capsule TAKE ONE CAPSULE TWICE A DAY 60 capsule 3  . sodium chloride (OCEAN) 0.65 % SOLN nasal spray Place 2 sprays into both nostrils as needed for congestion.    . traMADol (ULTRAM) 50 MG tablet Take 50 mg by mouth every 8 (eight) hours as needed for moderate pain or severe pain.    Marland Kitchen  valsartan-hydrochlorothiazide (DIOVAN-HCT) 320-25 MG per tablet Take 1 tablet by mouth daily. (Patient taking differently: Take 0.5 tablets by mouth 2 (two) times daily. ) 30 tablet 5  . zolpidem (AMBIEN) 10 MG tablet Take 10 mg by mouth at bedtime as needed.       No current facility-administered medications on file prior to visit.       Objective:   Physical Exam  Constitutional: She is oriented to person, place, and time. She appears well-developed and well-nourished. No distress.  HENT:  Head: Normocephalic and atraumatic.  Right Ear: External ear normal.  Left Ear: External ear normal.  Mouth/Throat: Oropharynx is clear and moist.  Eyes: Conjunctivae and EOM are normal. Pupils are equal, round, and reactive to light. Right eye exhibits no discharge. Left eye exhibits no discharge. No scleral icterus.  Neck: Normal range of motion. Neck supple. No thyromegaly present.  Cardiovascular: Normal rate and regular rhythm.   Pulmonary/Chest: Effort normal and breath sounds normal.  Lymphadenopathy:    She has no cervical adenopathy.  Neurological: She is alert and oriented to person, place, and time.  Skin: Skin is warm and dry. No rash noted. She is not diaphoretic.  Psychiatric: She has a normal mood and affect. Her behavior is normal. Judgment and thought content normal.   BP 120/55 mmHg  Pulse 70  Temp(Src) 99.1 F (37.3 C) (Oral)  Resp 14  Ht 5\' 2"  (1.575 m)  Wt 191 lb 6.4 oz (86.818 kg)  BMI 35.00 kg/m2  SpO2 94%     Assessment & Plan:

## 2014-11-12 NOTE — Telephone Encounter (Signed)
Pt request to have potassium level check when she come in for labs next week.msn

## 2014-11-12 NOTE — Patient Instructions (Signed)
Take the full dose of antibiotics.   Please go to [facility] for a Chest X-ray.   The albuterol inhaler will help open up your airways.   Call us if worse or not improving in 4 days.

## 2014-11-13 ENCOUNTER — Other Ambulatory Visit: Payer: Medicare Other

## 2014-11-13 ENCOUNTER — Other Ambulatory Visit: Payer: Self-pay

## 2014-11-13 DIAGNOSIS — I10 Essential (primary) hypertension: Secondary | ICD-10-CM

## 2014-11-18 ENCOUNTER — Telehealth: Payer: Self-pay

## 2014-11-18 ENCOUNTER — Other Ambulatory Visit (INDEPENDENT_AMBULATORY_CARE_PROVIDER_SITE_OTHER): Payer: Medicare Other

## 2014-11-18 DIAGNOSIS — N289 Disorder of kidney and ureter, unspecified: Secondary | ICD-10-CM

## 2014-11-18 DIAGNOSIS — R059 Cough, unspecified: Secondary | ICD-10-CM

## 2014-11-18 DIAGNOSIS — R05 Cough: Secondary | ICD-10-CM

## 2014-11-18 DIAGNOSIS — E785 Hyperlipidemia, unspecified: Secondary | ICD-10-CM | POA: Diagnosis not present

## 2014-11-18 MED ORDER — HYDROCOD POLST-CHLORPHEN POLST 10-8 MG/5ML PO LQCR: 5.0000 mL | Freq: Two times a day (BID) | ORAL | Status: DC | PRN

## 2014-11-18 NOTE — Telephone Encounter (Signed)
The x ray was ordered by [facility],  Can you find it for me/  Rx for tussionex on printer .  Please ask patient if she can tolerate vicodin /hydrocodone because it is the only thing stronger than tessalon

## 2014-11-18 NOTE — Telephone Encounter (Signed)
Left message for pt to return my call.  Requested CXR from [facility], if done there.

## 2014-11-18 NOTE — Telephone Encounter (Signed)
The patient called and is hoping to have something called in for her cough. She stated she has a had a cough for over a week and nothing has relieved the cough symptoms.

## 2014-11-18 NOTE — Telephone Encounter (Signed)
Pt came in for labs and said if she can get something for her cough she is having trouble sleeping due to her cough, pt said that "pearls" are not helping with her cough, and pt said she would like a call about the chest x-ray she had done on 11-12-2014

## 2014-11-18 NOTE — Telephone Encounter (Signed)
Please advise 

## 2014-11-19 LAB — COMPREHENSIVE METABOLIC PANEL
ALBUMIN: 3.3 g/dL — AB (ref 3.5–5.2)
ALK PHOS: 42 U/L (ref 39–117)
ALT: 23 U/L (ref 0–35)
AST: 30 U/L (ref 0–37)
BUN: 28 mg/dL — AB (ref 6–23)
CO2: 25 mEq/L (ref 19–32)
Calcium: 9.3 mg/dL (ref 8.4–10.5)
Chloride: 99 mEq/L (ref 96–112)
Creatinine, Ser: 1.4 mg/dL — ABNORMAL HIGH (ref 0.4–1.2)
GFR: 38.3 mL/min — ABNORMAL LOW (ref >60.00)
Glucose, Bld: 225 mg/dL — ABNORMAL HIGH (ref 70–99)
POTASSIUM: 4 meq/L (ref 3.5–5.1)
SODIUM: 133 meq/L — AB (ref 135–145)
Total Bilirubin: 0.3 mg/dL (ref 0.2–1.2)
Total Protein: 7.7 g/dL (ref 6.0–8.3)

## 2014-11-19 LAB — LIPID PANEL
Cholesterol: 154 mg/dL (ref 0–200)
HDL: 54.1 mg/dL (ref >39.00)
LDL CALC: 67 mg/dL (ref 0–99)
NONHDL: 99.9
Total CHOL/HDL Ratio: 3
Triglycerides: 165 mg/dL — ABNORMAL HIGH (ref 0.0–149.0)
VLDL: 33 mg/dL (ref 0.0–40.0)

## 2014-11-19 NOTE — Telephone Encounter (Signed)
Patient called this morning and stated she had chest X-ray at [facility]  X ray received. Findings are: There are more prominent interstitial markings particularly on the right. Although some of these markings could be acute, developing interstitial lung disease is a consideration and CT of the chest is recommended. Mediastinal and hilar contours are unremarkable. The heart is borderline enlarged and stable. A lower anterior cervical spine fusion plate is present. There are degenerative changes in the mid to lower thoracic spine.   Impression: 1. Slight prominence of interstitial markings right greater than left. Pneumonia is a consideration, however developing interstitial lung disease cannot be excluded if symptoms persist. Recommend CT of the chest to assess further.   2. Stable borderline cardiomegaly  Patient stated she can tolerate the tussinex and was placed at front desk for pick up.

## 2014-11-19 NOTE — Telephone Encounter (Signed)
Please tell her that the chest xr ay was abnormal,  And suggested either pneumonia or a more chronic but progressive process that needs further evaluation with a chest CT which i will order now.

## 2014-11-19 NOTE — Telephone Encounter (Signed)
Patient notified and voiced understanding.

## 2014-11-20 ENCOUNTER — Other Ambulatory Visit: Payer: Self-pay | Admitting: Internal Medicine

## 2014-11-20 ENCOUNTER — Ambulatory Visit: Payer: Self-pay | Admitting: Internal Medicine

## 2014-11-20 DIAGNOSIS — R911 Solitary pulmonary nodule: Secondary | ICD-10-CM | POA: Diagnosis not present

## 2014-11-20 DIAGNOSIS — R918 Other nonspecific abnormal finding of lung field: Secondary | ICD-10-CM | POA: Diagnosis not present

## 2014-11-20 DIAGNOSIS — R944 Abnormal results of kidney function studies: Secondary | ICD-10-CM

## 2014-11-21 ENCOUNTER — Telehealth: Payer: Self-pay | Admitting: Internal Medicine

## 2014-11-21 NOTE — Telephone Encounter (Signed)
I have placed CT results, for your review patient is anxious to hear results.

## 2014-11-21 NOTE — Telephone Encounter (Signed)
Andrea Stewart to pt at length about results of CT chest.  Discussed findings (to include scattered inflammatory changes including airway thickening and reticulonodular interstitial opacities.  Also discussed the more confluent and partially consolidated or solid lesion in the LUL.  (radiology commented likely inflammatory given other changes, but lung cancer cannot be totally excluded).  Also made her aware of the questionable thyroid changes.  She informed me that her symptoms were stable.  Still sob, but no worsening.  She is off lasix now.  See previous phone message.  Had seen cardiology and they instructed her to take daily for one week.  She has f/u with cardiology Monday 11/25/14.  We discussed further evaluation including pulmonary referral and f/u CT.  She prefers to have her daughter schedule an appt at [facility] (daughter works there and states she can get her an appt next week).  Explained if symptoms worsened or changed or if she need something to let us know.  Also explained that Dr Derrel Nip would f/u on the findings on scan for any further recommendations.

## 2014-11-25 ENCOUNTER — Other Ambulatory Visit (INDEPENDENT_AMBULATORY_CARE_PROVIDER_SITE_OTHER): Payer: Medicare Other

## 2014-11-25 ENCOUNTER — Other Ambulatory Visit: Payer: Self-pay

## 2014-11-25 DIAGNOSIS — R0602 Shortness of breath: Secondary | ICD-10-CM

## 2014-11-25 DIAGNOSIS — R5383 Other fatigue: Secondary | ICD-10-CM | POA: Diagnosis not present

## 2014-11-25 DIAGNOSIS — I10 Essential (primary) hypertension: Secondary | ICD-10-CM | POA: Diagnosis not present

## 2014-11-26 DIAGNOSIS — R0602 Shortness of breath: Secondary | ICD-10-CM | POA: Diagnosis not present

## 2014-11-26 LAB — BASIC METABOLIC PANEL
BUN/Creatinine Ratio: 15 (ref 11–26)
BUN: 20 mg/dL (ref 8–27)
CO2: 19 mmol/L (ref 18–29)
Calcium: 9.3 mg/dL (ref 8.7–10.3)
Chloride: 101 mmol/L (ref 97–108)
Creatinine, Ser: 1.36 mg/dL — ABNORMAL HIGH (ref 0.57–1.00)
GFR calc Af Amer: 46 mL/min/{1.73_m2} — ABNORMAL LOW (ref >59)
GFR calc non Af Amer: 40 mL/min/{1.73_m2} — ABNORMAL LOW (ref >59)
GLUCOSE: 237 mg/dL — AB (ref 65–99)
Potassium: 4.7 mmol/L (ref 3.5–5.2)
SODIUM: 137 mmol/L (ref 134–144)

## 2014-11-28 DIAGNOSIS — E042 Nontoxic multinodular goiter: Secondary | ICD-10-CM | POA: Diagnosis not present

## 2014-12-09 ENCOUNTER — Other Ambulatory Visit: Payer: Self-pay | Admitting: Cardiovascular Disease

## 2014-12-09 ENCOUNTER — Ambulatory Visit (INDEPENDENT_AMBULATORY_CARE_PROVIDER_SITE_OTHER): Payer: Medicare Other | Admitting: Cardiovascular Disease

## 2014-12-09 ENCOUNTER — Encounter: Payer: Self-pay | Admitting: Cardiovascular Disease

## 2014-12-09 VITALS — BP 154/72 | HR 83 | Ht 62.0 in | Wt 196.5 lb

## 2014-12-09 DIAGNOSIS — I10 Essential (primary) hypertension: Secondary | ICD-10-CM

## 2014-12-09 DIAGNOSIS — R0602 Shortness of breath: Secondary | ICD-10-CM | POA: Diagnosis not present

## 2014-12-09 DIAGNOSIS — I2510 Atherosclerotic heart disease of native coronary artery without angina pectoris: Secondary | ICD-10-CM

## 2014-12-09 DIAGNOSIS — I2583 Coronary atherosclerosis due to lipid rich plaque: Secondary | ICD-10-CM | POA: Diagnosis not present

## 2014-12-09 DIAGNOSIS — R059 Cough, unspecified: Secondary | ICD-10-CM

## 2014-12-09 DIAGNOSIS — E118 Type 2 diabetes mellitus with unspecified complications: Secondary | ICD-10-CM

## 2014-12-09 DIAGNOSIS — R0789 Other chest pain: Secondary | ICD-10-CM | POA: Diagnosis not present

## 2014-12-09 DIAGNOSIS — E1165 Type 2 diabetes mellitus with hyperglycemia: Secondary | ICD-10-CM

## 2014-12-09 DIAGNOSIS — IMO0002 Reserved for concepts with insufficient information to code with codable children: Secondary | ICD-10-CM

## 2014-12-09 DIAGNOSIS — R05 Cough: Secondary | ICD-10-CM | POA: Diagnosis not present

## 2014-12-09 NOTE — Assessment & Plan Note (Signed)
She is coming working with endocrine, reports her sugars are lower in the evenings. We have encouraged continued exercise, careful diet management in an effort to lose weight.

## 2014-12-09 NOTE — Assessment & Plan Note (Signed)
Recent low-grade cough likely secondary to residual inflammation following bronchitis several weeks ago. No further medications ordered. She has follow-up with pulmonary

## 2014-12-09 NOTE — Assessment & Plan Note (Signed)
Recent chest pains likely secondary to coughing, sounds musculoskeletal.

## 2014-12-09 NOTE — Assessment & Plan Note (Signed)
We have stressed importance of diet, portion control, increasing her exercise

## 2014-12-09 NOTE — Patient Instructions (Signed)
You are doing well. No medication changes were made.  Please take lasix as needed  For shortness of breath, cough  Please call us if you have new issues that need to be addressed before your next appt.  Your physician wants you to follow-up in: 6 months.  You will receive a reminder letter in the mail two months in advance. If you don't receive a letter, please call our office to schedule the follow-up appointment.

## 2014-12-09 NOTE — Assessment & Plan Note (Signed)
Currently with no symptoms of angina. No further workup at this time. Continue current medication regimen. 

## 2014-12-09 NOTE — Assessment & Plan Note (Addendum)
Blood pressure mildly elevated on today's visit, recommended she restart Lasix when necessary for leg edema, shortness of breath. Consider buying a new blood pressure cuff. Labile blood pressures in the past. Numerous allergies or side effects to the blood pressure medications previously tried. If needed, could increase Cardura up to 1 mg twice a day

## 2014-12-09 NOTE — Progress Notes (Signed)
Patient ID: Andrea Stewart, female    DOB: [birth year], 69 y.o.   MRN: [id number]  HPI Comments: Andrea Stewart is a 69 year old woman with history of obesity, poorly controlled diabetes, followed by Dr. Soyla Murphy, prior smoking history ,  who presents for routine followup of her shortness of breath, hypertension.  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 patient of   Dr. Derrel Nip  In follow-up today, she reports having some cough, worse when she lays down, leg edema, fatigue. She had bronchitis at the end of December 2015 with antibiotics. Since then she has had the above symptoms. Cough is slowly getting better. For shortness of breath, she was treated with Lasix 40 mg daily. Creatinine climbed up to 1.4 with elevated BUN. She's not been taking Lasix recently and reports having slight worsening of her leg edema Sugars she report have been running lower in the evenings She has stopped checking her blood pressure at home has blood pressure cuff was not working well but reports it is typically labile  She went on a cruise and got back 10/26/2014 She has appointment with pulmonary she thinks in March 2016 Lab work showing total cholesterol 154, LDL 67 EKG on today's visit showing normal sinus rhythm with rate 83 bpm, rare PVC  Other past medical issues   leg cramping, She takes tonic water, mustard for relief.  Cardiac catheterization by report more than 7 years ago that showed no significant CAD. She was previously seen by Dr. Olevia Perches.  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 Limited secondary to her arthritis conditions.  She is unable to tolerate amlodipine secondary to leg swelling, Diovan HCT (leg cramping) losartan (patient self discontinued after no BP response in a weekend - though it would take longer to see a response), valsartan (uncertain reason),  and  clonidine (fatigue).     Allergies  Allergen Reactions  . Morphine Anaphylaxis  . Gabapentin Other (See Comments)    intolerance  . Nsaids Diarrhea  . Penicillins Other (See Comments)    REACTION: rash, swelling. Remote reaction as a child to [place]. Patient did tolerate recent course of augmentin     Outpatient Encounter Prescriptions as of 12/09/2014  Medication Sig  . albuterol (PROVENTIL HFA) 108 (90 BASE) MCG/ACT inhaler Inhale 2 puffs into the lungs every 4 (four) hours as needed for wheezing or shortness of breath.  Marland Kitchen aspirin 81 MG tablet Take 1 tablet (81 mg total) by mouth daily.  . benzonatate (TESSALON) 200 MG capsule Take 1 capsule (200 mg total) by mouth 3 (three) times daily as needed for cough.  . calcium carbonate (OS-CAL) 600 MG TABS Take 600 mg by mouth daily.    . chlorpheniramine-HYDROcodone (TUSSIONEX PENNKINETIC ER) 10-8 MG/5ML LQCR Take 5 mLs by mouth every 12 (twelve) hours as needed for cough.  . cyclobenzaprine (FLEXERIL) 10 MG tablet   . DAILY MULTIPLE VITAMINS PO Take 1 tablet by mouth daily.    Marland Kitchen doxazosin (CARDURA) 1 MG tablet Take 1 tablet (1 mg total) by mouth daily.  . fluticasone (VERAMYST) 27.5 MCG/SPRAY nasal spray Place 2 sprays into the nose as needed for rhinitis or allergies.   . Fluticasone-Salmeterol (ADVAIR DISKUS) 250-50 MCG/DOSE AEPB Inhale 1 puff into the lungs as needed (bronchitis symptoms).   . insulin lispro (HUMALOG) 100 UNIT/ML injection Inject into the  skin as directed. Sliding scale 100-150 = 2-2.5 units 150-200 = 3-3.5 units 200-250 = 4-4.5 units 251-300 = 5-5.5 units 301-350 = 6-6.5 units 350-400 = 7-7.5 units  . insulin NPH (HUMULIN N,NOVOLIN N) 100 UNIT/ML injection Inject 20 Units into the skin daily before breakfast. 22 units at bedtime  . metFORMIN (GLUCOPHAGE-XR) 500 MG 24 hr tablet Take 500 mg by mouth 2 (two) times daily.   . methocarbamol (ROBAXIN) 500 MG tablet Take 500 mg by mouth every 6 (six) hours as needed for  muscle spasms.   . Misc Natural Products (TURMERIC CURCUMIN) CAPS Take 1 capsule by mouth daily.  . mometasone (NASONEX) 50 MCG/ACT nasal spray Place 2 sprays into the nose daily.    . nitroGLYCERIN (NITROSTAT) 0.4 MG SL tablet Place 1 tablet (0.4 mg total) under the tongue every 5 (five) minutes as needed. Maximum 3 tablets (Patient taking differently: Place 0.4 mg under the tongue every 5 (five) minutes as needed for chest pain. Maximum 3 tablets)  . omeprazole (PRILOSEC) 40 MG capsule TAKE ONE CAPSULE TWICE A DAY  . sodium chloride (OCEAN) 0.65 % SOLN nasal spray Place 2 sprays into both nostrils as needed for congestion.  . traMADol (ULTRAM) 50 MG tablet Take 50 mg by mouth every 8 (eight) hours as needed for moderate pain or severe pain.  . valsartan-hydrochlorothiazide (DIOVAN-HCT) 320-25 MG per tablet Take 1 tablet by mouth daily. (Patient taking differently: Take 0.5 tablets by mouth 2 (two) times daily. )  . zolpidem (AMBIEN) 10 MG tablet Take 10 mg by mouth at bedtime as needed.    . [DISCONTINUED] azithromycin (ZITHROMAX) 250 MG tablet Take 2 by mouth the first day, take 1 tablet by mouth the rest of the days. (Patient not taking: Reported on 12/09/2014)  . [DISCONTINUED] cloNIDine (CATAPRES) 0.1 MG tablet Take 1 tablet (0.1 mg total) by mouth 2 (two) times daily. (Patient not taking: Reported on 12/09/2014)  . [DISCONTINUED] furosemide (LASIX) 40 MG tablet Take 1 tablet (40 mg total) by mouth daily. (Patient not taking: Reported on 12/09/2014)    Past Medical History  Diagnosis Date  . Myalgia and myositis, unspecified   . Plantar fascial fibromatosis   . Personal history of tobacco use, presenting hazards to health     1/2 ppd  . Degeneration of intervertebral disc, site unspecified   . Backache, unspecified     s/p c-spine and l-spine fusions  . Obesity, unspecified   . Disorder of bone and cartilage, unspecified   . Unspecified hereditary and idiopathic peripheral neuropathy      secondary to diabetes  . Unspecified essential hypertension   . Type II or unspecified type diabetes mellitus without mention of complication, not stated as uncontrolled   . Depressive disorder, not elsewhere classified   . GERD (gastroesophageal reflux disease)   . S/p nephrectomy   . S/P cholecystectomy   . Hx of left heart catheterization by cutdown     a. 7 years ago; no significant cad; b. Lexiscan 2014: no significant ischemia, no significant EKG changes concerning for ischemia, EF 67%, overall low risk study  . HTN (hypertension)     Past Surgical History  Procedure Laterality Date  . Appendectomy    . Carpal tunnel release    . Cholecystectomy    . Vesicovaginal fistula closure w/ tah    . Replacement total knee  10/2004  . Foot surgery      x 3  . Elbow surgery    .  Trigger finger surgery    . Kidney donation  80  . Cervical discectomy    . Lumbar fusion    . Colonoscopy  09/1999    colonoscopy-hemorrhoids, re-check 5 years (10/2006)  . Cardiac catheterization      o.k. 2003  . Dexa      osteopenia (01/2004)  . Neck surgery      06/2005  . Back surgery      2006  . Problem with general anesthesia      02/2006  . Abdominal hysterectomy      Social History  reports that she quit smoking about 3 years ago. Her smoking use included Cigarettes. She smoked 1.00 pack per day. She has never used smokeless tobacco. She reports that she does not drink alcohol or use illicit drugs.  Family History family history includes COPD in her father; Cancer in her father and mother; Colon cancer in her mother; Coronary artery disease in her mother; Heart attack (age of onset: 61) in her brother; Hypertension in her mother; Prostate cancer in her father; Sleep apnea in her brother.   Review of Systems  Constitutional: Positive for fatigue.  Respiratory: Positive for cough and shortness of breath.   Cardiovascular: Positive for leg swelling.  Gastrointestinal: Negative.    Musculoskeletal: Positive for back pain, joint swelling, arthralgias and gait problem.  Neurological: Negative.   Psychiatric/Behavioral: Negative.   All other systems reviewed and are negative.   BP 154/72 mmHg  Pulse 83  Ht 5\' 2"  (1.575 m)  Wt 196 lb 8 oz (89.132 kg)  BMI 35.93 kg/m2  Physical Exam  Constitutional: She is oriented to person, place, and time. She appears well-developed and well-nourished.  Morbidly obese  HENT:  Head: Normocephalic.  Nose: Nose normal.  Mouth/Throat: Oropharynx is clear and moist.  Eyes: Conjunctivae are normal. Pupils are equal, round, and reactive to light.  Neck: Normal range of motion. Neck supple. No JVD present.  Cardiovascular: Normal rate, regular rhythm, S1 normal, S2 normal, normal heart sounds and intact distal pulses.  Exam reveals no gallop and no friction rub.   No murmur heard. Pulmonary/Chest: Effort normal and breath sounds normal. No respiratory distress. She has no wheezes. She has no rales. She exhibits no tenderness.  Abdominal: Soft. Bowel sounds are normal. She exhibits no distension. There is no tenderness.  Musculoskeletal: Normal range of motion. She exhibits no edema or tenderness.  Lymphadenopathy:    She has no cervical adenopathy.  Neurological: She is alert and oriented to person, place, and time. Coordination normal.  Skin: Skin is warm and dry. No rash noted. No erythema.  Psychiatric: She has a normal mood and affect. Her behavior is normal. Judgment and thought content normal.    Assessment and Plan  Nursing note and vitals reviewed.

## 2014-12-13 ENCOUNTER — Encounter: Payer: Self-pay | Admitting: Internal Medicine

## 2014-12-13 DIAGNOSIS — R222 Localized swelling, mass and lump, trunk: Secondary | ICD-10-CM | POA: Diagnosis not present

## 2014-12-13 DIAGNOSIS — E079 Disorder of thyroid, unspecified: Secondary | ICD-10-CM | POA: Insufficient documentation

## 2014-12-13 DIAGNOSIS — R1310 Dysphagia, unspecified: Secondary | ICD-10-CM | POA: Diagnosis not present

## 2014-12-17 ENCOUNTER — Encounter: Payer: Self-pay | Admitting: Internal Medicine

## 2014-12-19 ENCOUNTER — Ambulatory Visit: Payer: Self-pay | Admitting: Internal Medicine

## 2014-12-19 DIAGNOSIS — M8589 Other specified disorders of bone density and structure, multiple sites: Secondary | ICD-10-CM | POA: Diagnosis not present

## 2014-12-19 DIAGNOSIS — Z1231 Encounter for screening mammogram for malignant neoplasm of breast: Secondary | ICD-10-CM | POA: Diagnosis not present

## 2014-12-19 DIAGNOSIS — Z780 Asymptomatic menopausal state: Secondary | ICD-10-CM | POA: Diagnosis not present

## 2014-12-19 DIAGNOSIS — M8580 Other specified disorders of bone density and structure, unspecified site: Secondary | ICD-10-CM | POA: Diagnosis not present

## 2014-12-19 LAB — HM MAMMOGRAPHY: HM Mammogram: NEGATIVE

## 2014-12-19 LAB — HM DEXA SCAN

## 2014-12-23 ENCOUNTER — Telehealth: Payer: Self-pay | Admitting: Internal Medicine

## 2014-12-23 DIAGNOSIS — M949 Disorder of cartilage, unspecified: Principal | ICD-10-CM

## 2014-12-23 DIAGNOSIS — M899 Disorder of bone, unspecified: Secondary | ICD-10-CM

## 2014-12-23 NOTE — Assessment & Plan Note (Signed)
Bone Density scores received, she has osteopenia,  Moderate.  Would repeat in 2 years and consider therapy then if there is a significant change. Continue calcium, vitamin d and weight bearing exercise on a regular basis.  

## 2014-12-23 NOTE — Telephone Encounter (Signed)
Patient notified and voiced understanding.

## 2014-12-23 NOTE — Telephone Encounter (Signed)
Bone Density scores received, she has osteopenia,  Moderate.  Would repeat in 2 years and consider therapy then if there is a significant change. Continue calcium, vitamin d and weight bearing exercise on a regular basis.  

## 2014-12-31 ENCOUNTER — Other Ambulatory Visit: Payer: Self-pay | Admitting: Internal Medicine

## 2014-12-31 DIAGNOSIS — E041 Nontoxic single thyroid nodule: Secondary | ICD-10-CM | POA: Diagnosis not present

## 2015-01-01 DIAGNOSIS — I10 Essential (primary) hypertension: Secondary | ICD-10-CM | POA: Diagnosis not present

## 2015-01-01 DIAGNOSIS — E1165 Type 2 diabetes mellitus with hyperglycemia: Secondary | ICD-10-CM | POA: Diagnosis not present

## 2015-01-15 ENCOUNTER — Institutional Professional Consult (permissible substitution): Payer: Medicare Other | Admitting: Internal Medicine

## 2015-01-20 ENCOUNTER — Encounter: Payer: Self-pay | Admitting: Pulmonary Disease

## 2015-01-20 ENCOUNTER — Ambulatory Visit (INDEPENDENT_AMBULATORY_CARE_PROVIDER_SITE_OTHER): Payer: Medicare Other | Admitting: Pulmonary Disease

## 2015-01-20 VITALS — BP 144/88 | HR 84 | Temp 97.5°F | Ht 61.0 in | Wt 195.5 lb

## 2015-01-20 DIAGNOSIS — R0600 Dyspnea, unspecified: Secondary | ICD-10-CM | POA: Diagnosis not present

## 2015-01-20 DIAGNOSIS — Z87891 Personal history of nicotine dependence: Secondary | ICD-10-CM

## 2015-01-20 DIAGNOSIS — Z720 Tobacco use: Secondary | ICD-10-CM | POA: Diagnosis not present

## 2015-01-20 DIAGNOSIS — R911 Solitary pulmonary nodule: Secondary | ICD-10-CM

## 2015-01-20 DIAGNOSIS — R0602 Shortness of breath: Secondary | ICD-10-CM

## 2015-01-20 DIAGNOSIS — R072 Precordial pain: Secondary | ICD-10-CM | POA: Diagnosis not present

## 2015-01-20 MED ORDER — AEROCHAMBER MV MISC: Status: AC

## 2015-01-20 MED ORDER — BUDESONIDE-FORMOTEROL FUMARATE 160-4.5 MCG/ACT IN AERO: 2.0000 | INHALATION_SPRAY | Freq: Two times a day (BID) | RESPIRATORY_TRACT | Status: DC

## 2015-01-20 NOTE — Progress Notes (Signed)
Subjective:    Patient ID: Andrea Stewart, female    DOB: [birth year], 69 y.o.   MRN: [id number]  HPI Chief Complaint  Patient presents with  . PULMONARY CONSULT    Referred by Dr Orma Render. Requesting repeat CT. Recent CT 11/20/14.    Zian was referred by my colleague Dr. Loura Back from [facility].  She notes that starting back in December sh started feeling short of breath.  Apparently she was very sick in December while on a cruise ship and ended up in the doctors station on the cruise ship.  She noted significant swelling at that time.  She has been experiencing trouble with her blood pressure during this time as well.  She said that the dyspnea worsened and she was diagnosed with pneumonia after a Chest X-ray and she ended up being treated with antibiotics.   She says that she ended up having a chest CT and was found to have a pulmonary nodule.  Apparently she had this done in January around the time of the chest X-ray which showed pulmonary nodule.  She says that she recovered from the pneumonia but she still feels short of breath and have no energy.  She says that she sleeps every day and takes a lot of naps.  She sleeps well at night.  She snores but doesn't have witnessed apneas.  She feels well rested in the morning.  She gets up to go to the bathroom several times per night.  She says that she is taking an inhaler (symbicort) given to her by Dr. Loura Back which helps.  She says that she has noted dyspnea for about 6-12 months.  She smoked for many years and has smoked 1 pack per day for at least 24 years.  She now smokes 5 cigs per day.  She had chest pressure from time to time at rest.  She says that it will occur for 15-20 minutes.  She had a clean coronary catheterization 6-7 years ago.    Past Medical History  Diagnosis Date  . Myalgia and myositis, unspecified   . Plantar fascial fibromatosis   . Personal history of tobacco use, presenting hazards to health     1/2 ppd  . Degeneration of  intervertebral disc, site unspecified   . Backache, unspecified     s/p c-spine and l-spine fusions  . Obesity, unspecified   . Disorder of bone and cartilage, unspecified   . Unspecified hereditary and idiopathic peripheral neuropathy     secondary to diabetes  . Unspecified essential hypertension   . Type II or unspecified type diabetes mellitus without mention of complication, not stated as uncontrolled   . Depressive disorder, not elsewhere classified   . GERD (gastroesophageal reflux disease)   . S/p nephrectomy   . S/P cholecystectomy   . Hx of left heart catheterization by cutdown     a. 7 years ago; no significant cad; b. Lexiscan 2014: no significant ischemia, no significant EKG changes concerning for ischemia, EF 67%, overall low risk study  . HTN (hypertension)      Family History  Problem Relation Age of Onset  . Hypertension Mother   . Colon cancer Mother   . Cancer Mother     colon  . Coronary artery disease Mother   . Prostate cancer Father   . COPD Father   . Cancer Father     prostate  . Heart attack Brother 49  . Sleep apnea Brother  History   Social History  . Marital Status: Married    Spouse Name: N/A  . Number of Children: 4  . Years of Education: N/A   Occupational History  . Retired    Social History Main Topics  . Smoking status: Current Every Day Smoker -- 0.50 packs/day for 23 years    Types: Cigarettes    Last Attempt to Quit: 08/31/2011  . Smokeless tobacco: Never Used     Comment: QUIT 08/31/2011 x 2 years and started back d/t stress in November 2015.   Marland Kitchen Alcohol Use: No  . Drug Use: No  . Sexual Activity: Not on file   Other Topics Concern  . Not on file   Social History Narrative   Lives in [place]. Disability because of back     Allergies  Allergen Reactions  . Morphine Anaphylaxis  . Gabapentin Other (See Comments)    intolerance  . Nsaids Diarrhea  . Penicillins Other (See Comments)    REACTION: rash,  swelling. Remote reaction as a child to [place]. Patient did tolerate recent course of augmentin      Outpatient Prescriptions Prior to Visit  Medication Sig Dispense Refill  . albuterol (PROVENTIL HFA) 108 (90 BASE) MCG/ACT inhaler Inhale 2 puffs into the lungs every 4 (four) hours as needed for wheezing or shortness of breath. 1 Inhaler 2  . aspirin 81 MG tablet Take 1 tablet (81 mg total) by mouth daily.    . benzonatate (TESSALON) 200 MG capsule Take 1 capsule (200 mg total) by mouth 3 (three) times daily as needed for cough. 60 capsule 1  . calcium carbonate (OS-CAL) 600 MG TABS Take 600 mg by mouth daily.      . chlorpheniramine-HYDROcodone (TUSSIONEX PENNKINETIC ER) 10-8 MG/5ML LQCR Take 5 mLs by mouth every 12 (twelve) hours as needed for cough. 200 mL 0  . cyclobenzaprine (FLEXERIL) 10 MG tablet     . DAILY MULTIPLE VITAMINS PO Take 1 tablet by mouth daily.      Marland Kitchen doxazosin (CARDURA) 2 MG tablet TAKE ONE-HALF (1/2) TABLET EVERY DAY 15 tablet 3  . fluticasone (VERAMYST) 27.5 MCG/SPRAY nasal spray Place 2 sprays into the nose as needed for rhinitis or allergies.     . Fluticasone-Salmeterol (ADVAIR DISKUS) 250-50 MCG/DOSE AEPB Inhale 1 puff into the lungs as needed (bronchitis symptoms).     . insulin lispro (HUMALOG) 100 UNIT/ML injection Inject into the skin as directed. Sliding scale 100-150 = 2-2.5 units 150-200 = 3-3.5 units 200-250 = 4-4.5 units 251-300 = 5-5.5 units 301-350 = 6-6.5 units 350-400 = 7-7.5 units    . insulin NPH (HUMULIN N,NOVOLIN N) 100 UNIT/ML injection Inject 20 Units into the skin daily before breakfast. 22 units at bedtime    . metFORMIN (GLUCOPHAGE-XR) 500 MG 24 hr tablet Take 500 mg by mouth 2 (two) times daily.     . methocarbamol (ROBAXIN) 500 MG tablet Take 500 mg by mouth every 6 (six) hours as needed for muscle spasms.     . mometasone (NASONEX) 50 MCG/ACT nasal spray Place 2 sprays into the nose daily.      . nitroGLYCERIN (NITROSTAT) 0.4 MG SL  tablet Place 1 tablet (0.4 mg total) under the tongue every 5 (five) minutes as needed. Maximum 3 tablets (Patient taking differently: Place 0.4 mg under the tongue every 5 (five) minutes as needed for chest pain. Maximum 3 tablets) 25 tablet 6  . omeprazole (PRILOSEC) 40 MG capsule TAKE ONE CAPSULE  TWICE A DAY 60 capsule 3  . sodium chloride (OCEAN) 0.65 % SOLN nasal spray Place 2 sprays into both nostrils as needed for congestion.    . traMADol (ULTRAM) 50 MG tablet Take 50 mg by mouth every 8 (eight) hours as needed for moderate pain or severe pain.    . valsartan-hydrochlorothiazide (DIOVAN-HCT) 320-25 MG per tablet Take 1 tablet by mouth daily. (Patient taking differently: Take 0.5 tablets by mouth 2 (two) times daily. ) 30 tablet 5  . zolpidem (AMBIEN) 10 MG tablet Take 10 mg by mouth at bedtime as needed.      . Misc Natural Products (TURMERIC CURCUMIN) CAPS Take 1 capsule by mouth daily.     No facility-administered medications prior to visit.       Review of Systems  Constitutional: Negative for fever and unexpected weight change.  HENT: Negative for congestion, dental problem, ear pain, nosebleeds, postnasal drip, rhinorrhea, sinus pressure, sneezing, sore throat and trouble swallowing.   Eyes: Negative for redness and itching.  Respiratory: Positive for cough, shortness of breath ( exertion, talking, sometimes at rest) and wheezing. Chest tightness: x 1 year.   Cardiovascular: Positive for leg swelling ( since December 2015). Negative for palpitations.  Gastrointestinal: Negative for nausea and vomiting.  Genitourinary: Negative for dysuria.  Musculoskeletal: Negative for joint swelling.  Skin: Negative for rash.  Neurological: Positive for headaches.  Hematological: Does not bruise/bleed easily.  Psychiatric/Behavioral: Negative for dysphoric mood. The patient is nervous/anxious.        Objective:   Physical Exam Filed Vitals:   01/20/15 1403  BP: 144/88  Pulse: 84    Temp: 97.5 F (36.4 C)  TempSrc: Oral  Height: 5\' 1"  (1.549 m)  Weight: 195 lb 8 oz (88.678 kg)  SpO2: 96%   Ambulate 500 feet on room air and her O2 saturation was normal  Gen: obese but well appearing, no acute distress HEENT: NCAT, PERRL, EOMi, OP clear, neck supple without masses PULM: CTA B CV: RRR, no mgr, no JVD AB: BS+, soft, nontender, no hsm Ext: warm, no edema, no clubbing, no cyanosis Derm: no rash or skin breakdown Neuro: A&Ox4, CN II-XII intact, strength 5/5 in all 4 extremities       Assessment & Plan:   Shortness of breath I explained to Jocelyn Lamer today that I believe that her shortness of breath is multifactorial. Is very likely that she has COPD considering her lengthy smoking history.  Her obesity is also likely contributing as is physical deconditioning. Unfortunately she continues to smoke. She has not had lung function testing yet but we will go ahead and treat her empirically. She was not using her inhalers appropriately so she was provided on education with proper inhaler use today. Further, she was not taking a controller medicine, just the albuterol.  Plan: -full pulmonary function testing -Start Symbicort with a spacer 2 puffs twice a day, educated on the fact that she needs to use this on a regular basis and not as needed -HFA inhaler technique was reviewed again today in clinic demonstration provided -Ambulate as able, may need to be enrolled in pulmonary rehabilitation   Solitary pulmonary nodule Hopefully this nodule is just a residual change from her episode of pneumonia from December. I agree with the radiologist that she needs a three-month follow-up. We will arrange that for around April 6.   Chest pain She continues to have atypical chest pain which occurs at rest. She has a history of a normal heart  catheterization several years ago and her recent cardiac evaluations have not been worrisome for ischemia. If she does not see improvement in her  chest pressure with treatment for presumed COPD then she may need to be evaluated again by cardiology for consideration of another heart catheterization.   History of smoking Unfortunately the key continues to smoke cigarettes. She was educated at length today on the importance of quitting immediately. We discussed various treatment options including medication. She was to try to quit on her own at this point.     Updated Medication List Outpatient Encounter Prescriptions as of 01/20/2015  Medication Sig  . albuterol (PROVENTIL HFA) 108 (90 BASE) MCG/ACT inhaler Inhale 2 puffs into the lungs every 4 (four) hours as needed for wheezing or shortness of breath.  Marland Kitchen aspirin 81 MG tablet Take 1 tablet (81 mg total) by mouth daily.  . benzonatate (TESSALON) 200 MG capsule Take 1 capsule (200 mg total) by mouth 3 (three) times daily as needed for cough.  . budesonide-formoterol (SYMBICORT) 160-4.5 MCG/ACT inhaler Inhale 2 puffs into the lungs 2 (two) times daily.  . calcium carbonate (OS-CAL) 600 MG TABS Take 600 mg by mouth daily.    . chlorpheniramine-HYDROcodone (TUSSIONEX PENNKINETIC ER) 10-8 MG/5ML LQCR Take 5 mLs by mouth every 12 (twelve) hours as needed for cough.  . cyclobenzaprine (FLEXERIL) 10 MG tablet   . DAILY MULTIPLE VITAMINS PO Take 1 tablet by mouth daily.    Marland Kitchen doxazosin (CARDURA) 2 MG tablet TAKE ONE-HALF (1/2) TABLET EVERY DAY  . fluticasone (VERAMYST) 27.5 MCG/SPRAY nasal spray Place 2 sprays into the nose as needed for rhinitis or allergies.   . Fluticasone-Salmeterol (ADVAIR DISKUS) 250-50 MCG/DOSE AEPB Inhale 1 puff into the lungs as needed (bronchitis symptoms).   . insulin lispro (HUMALOG) 100 UNIT/ML injection Inject into the skin as directed. Sliding scale 100-150 = 2-2.5 units 150-200 = 3-3.5 units 200-250 = 4-4.5 units 251-300 = 5-5.5 units 301-350 = 6-6.5 units 350-400 = 7-7.5 units  . insulin NPH (HUMULIN N,NOVOLIN N) 100 UNIT/ML injection Inject 20 Units into the  skin daily before breakfast. 22 units at bedtime  . metFORMIN (GLUCOPHAGE-XR) 500 MG 24 hr tablet Take 500 mg by mouth 2 (two) times daily.   . methocarbamol (ROBAXIN) 500 MG tablet Take 500 mg by mouth every 6 (six) hours as needed for muscle spasms.   . Misc Natural Products (TURMERIC CURCUMIN) CAPS 900mg  capsule daily  . mometasone (NASONEX) 50 MCG/ACT nasal spray Place 2 sprays into the nose daily.    . nitroGLYCERIN (NITROSTAT) 0.4 MG SL tablet Place 1 tablet (0.4 mg total) under the tongue every 5 (five) minutes as needed. Maximum 3 tablets (Patient taking differently: Place 0.4 mg under the tongue every 5 (five) minutes as needed for chest pain. Maximum 3 tablets)  . omeprazole (PRILOSEC) 40 MG capsule TAKE ONE CAPSULE TWICE A DAY  . sodium chloride (OCEAN) 0.65 % SOLN nasal spray Place 2 sprays into both nostrils as needed for congestion.  Marland Kitchen Spacer/Aero-Holding Chambers (AEROCHAMBER MV) inhaler Use as instructed  . traMADol (ULTRAM) 50 MG tablet Take 50 mg by mouth every 8 (eight) hours as needed for moderate pain or severe pain.  . valsartan-hydrochlorothiazide (DIOVAN-HCT) 320-25 MG per tablet Take 1 tablet by mouth daily. (Patient taking differently: Take 0.5 tablets by mouth 2 (two) times daily. )  . zolpidem (AMBIEN) 10 MG tablet Take 10 mg by mouth at bedtime as needed.    . [DISCONTINUED] Misc Natural Products (  TURMERIC CURCUMIN) CAPS Take 1 capsule by mouth daily.

## 2015-01-20 NOTE — Patient Instructions (Addendum)
Take the Sybmicort 2 puffs twice a day with a spacer no matter how you feel Use the albuterol as needed for shortness of breath We will arrange a pulmonary function test in [place]  We will arrange a CT scan of your chest for February 19, 2015 QUIT SMOKING!  We will see you back in 3 months or sooner if needed

## 2015-01-21 DIAGNOSIS — J449 Chronic obstructive pulmonary disease, unspecified: Secondary | ICD-10-CM | POA: Insufficient documentation

## 2015-01-21 DIAGNOSIS — R9389 Abnormal findings on diagnostic imaging of other specified body structures: Secondary | ICD-10-CM | POA: Insufficient documentation

## 2015-01-21 NOTE — Assessment & Plan Note (Signed)
Unfortunately the key continues to smoke cigarettes. She was educated at length today on the importance of quitting immediately. We discussed various treatment options including medication. She was to try to quit on her own at this point.

## 2015-01-21 NOTE — Assessment & Plan Note (Signed)
She continues to have atypical chest pain which occurs at rest. She has a history of a normal heart catheterization several years ago and her recent cardiac evaluations have not been worrisome for ischemia. If she does not see improvement in her chest pressure with treatment for presumed COPD then she may need to be evaluated again by cardiology for consideration of another heart catheterization.

## 2015-01-21 NOTE — Assessment & Plan Note (Signed)
Hopefully this nodule is just a residual change from her episode of pneumonia from December. I agree with the radiologist that she needs a three-month follow-up. We will arrange that for around April 6.

## 2015-01-21 NOTE — Assessment & Plan Note (Signed)
I explained to Andrea Stewart today that I believe that her shortness of breath is multifactorial. Is very likely that she has COPD considering her lengthy smoking history.  Her obesity is also likely contributing as is physical deconditioning. Unfortunately she continues to smoke. She has not had lung function testing yet but we will go ahead and treat her empirically. She was not using her inhalers appropriately so she was provided on education with proper inhaler use today. Further, she was not taking a controller medicine, just the albuterol.  Plan: -full pulmonary function testing -Start Symbicort with a spacer 2 puffs twice a day, educated on the fact that she needs to use this on a regular basis and not as needed -HFA inhaler technique was reviewed again today in clinic demonstration provided -Ambulate as able, may need to be enrolled in pulmonary rehabilitation

## 2015-01-30 ENCOUNTER — Ambulatory Visit (INDEPENDENT_AMBULATORY_CARE_PROVIDER_SITE_OTHER): Payer: Medicare Other | Admitting: Pulmonary Disease

## 2015-01-30 DIAGNOSIS — R0602 Shortness of breath: Secondary | ICD-10-CM | POA: Diagnosis not present

## 2015-01-30 LAB — PULMONARY FUNCTION TEST
DL/VA % pred: 74 %
DL/VA: 3.27 ml/min/mmHg/L
DLCO unc % pred: 136 %
DLCO unc: 27.63 ml/min/mmHg
FEF 25-75 PRE: 0.88 L/s
FEF 25-75 Post: 1.16 L/sec
FEF2575-%CHANGE-POST: 31 %
FEF2575-%PRED-PRE: 50 %
FEF2575-%Pred-Post: 66 %
FEV1-%CHANGE-POST: 1 %
FEV1-%Pred-Post: 58 %
FEV1-%Pred-Pre: 57 %
FEV1-POST: 1.16 L
FEV1-PRE: 1.14 L
FEV1FVC-%Change-Post: -2 %
FEV1FVC-%PRED-PRE: 99 %
FEV6-%Change-Post: 6 %
FEV6-%PRED-POST: 62 %
FEV6-%PRED-PRE: 58 %
FEV6-Post: 1.58 L
FEV6-Pre: 1.49 L
FEV6FVC-%CHANGE-POST: 1 %
FEV6FVC-%PRED-POST: 105 %
FEV6FVC-%Pred-Pre: 104 %
FVC-%Change-Post: 4 %
FVC-%PRED-POST: 59 %
FVC-%Pred-Pre: 57 %
FVC-POST: 1.58 L
FVC-Pre: 1.52 L
PRE FEV6/FVC RATIO: 99 %
Post FEV1/FVC ratio: 74 %
Post FEV6/FVC ratio: 100 %
Pre FEV1/FVC ratio: 75 %
RV % PRED: 133 %
RV: 2.72 L
TLC % pred: 99 %
TLC: 4.57 L

## 2015-01-30 NOTE — Progress Notes (Signed)
PFT performed today. 

## 2015-02-11 ENCOUNTER — Telehealth: Payer: Self-pay | Admitting: Pulmonary Disease

## 2015-02-11 MED ORDER — BUDESONIDE-FORMOTEROL FUMARATE 160-4.5 MCG/ACT IN AERO: 2.0000 | INHALATION_SPRAY | Freq: Two times a day (BID) | RESPIRATORY_TRACT | Status: DC

## 2015-02-11 NOTE — Telephone Encounter (Signed)
Rx has been sent in. Pt is aware. Nothing further was needed. 

## 2015-02-11 NOTE — Progress Notes (Signed)
Quick Note:  Called and spoke to pt and relayed results per BQ. Pt verbalized understanding. Pt has f/u scheduled for 4/20 at [facility] office. Per last OV, BQ wanted pt back in June. Pt stated she does not want to wait that long for another OV--appt kept for 03/05/15 per pt. Nothing further needed. ______

## 2015-02-18 DIAGNOSIS — M5116 Intervertebral disc disorders with radiculopathy, lumbar region: Secondary | ICD-10-CM | POA: Diagnosis not present

## 2015-02-18 DIAGNOSIS — M4302 Spondylolysis, cervical region: Secondary | ICD-10-CM | POA: Diagnosis not present

## 2015-02-19 ENCOUNTER — Ambulatory Visit
Admit: 2015-02-19 | Disposition: A | Discharge status: Home / Self Care | Payer: Self-pay | Attending: Pulmonary Disease | Admitting: Pulmonary Disease

## 2015-02-19 DIAGNOSIS — R0602 Shortness of breath: Secondary | ICD-10-CM | POA: Diagnosis not present

## 2015-02-19 DIAGNOSIS — J479 Bronchiectasis, uncomplicated: Secondary | ICD-10-CM | POA: Diagnosis not present

## 2015-02-19 DIAGNOSIS — R911 Solitary pulmonary nodule: Secondary | ICD-10-CM | POA: Diagnosis not present

## 2015-02-21 ENCOUNTER — Ambulatory Visit (INDEPENDENT_AMBULATORY_CARE_PROVIDER_SITE_OTHER): Payer: Medicare Other | Admitting: Pulmonary Disease

## 2015-02-21 ENCOUNTER — Encounter: Payer: Self-pay | Admitting: Pulmonary Disease

## 2015-02-21 ENCOUNTER — Telehealth: Payer: Self-pay | Admitting: Pulmonary Disease

## 2015-02-21 VITALS — BP 152/78 | HR 61 | Temp 98.0°F | Ht 61.0 in | Wt 197.0 lb

## 2015-02-21 DIAGNOSIS — J449 Chronic obstructive pulmonary disease, unspecified: Secondary | ICD-10-CM | POA: Diagnosis not present

## 2015-02-21 DIAGNOSIS — R938 Abnormal findings on diagnostic imaging of other specified body structures: Secondary | ICD-10-CM | POA: Diagnosis not present

## 2015-02-21 DIAGNOSIS — R9389 Abnormal findings on diagnostic imaging of other specified body structures: Secondary | ICD-10-CM

## 2015-02-21 IMAGING — CR DG CHEST 2V
1 series · 2 of 2 positions shown · non-contrast
Comparison: Chest x-ray of 08/14/2009

CLINICAL DATA: Cough for 3 days, fever, some shortness of breath
and chest tightness

EXAM:
CHEST  2 VIEW

[Series 1: dxr chest pa (or ap) and lateral · 0.14mm/px · 2 of 2 slices shown]
[im 1/2]
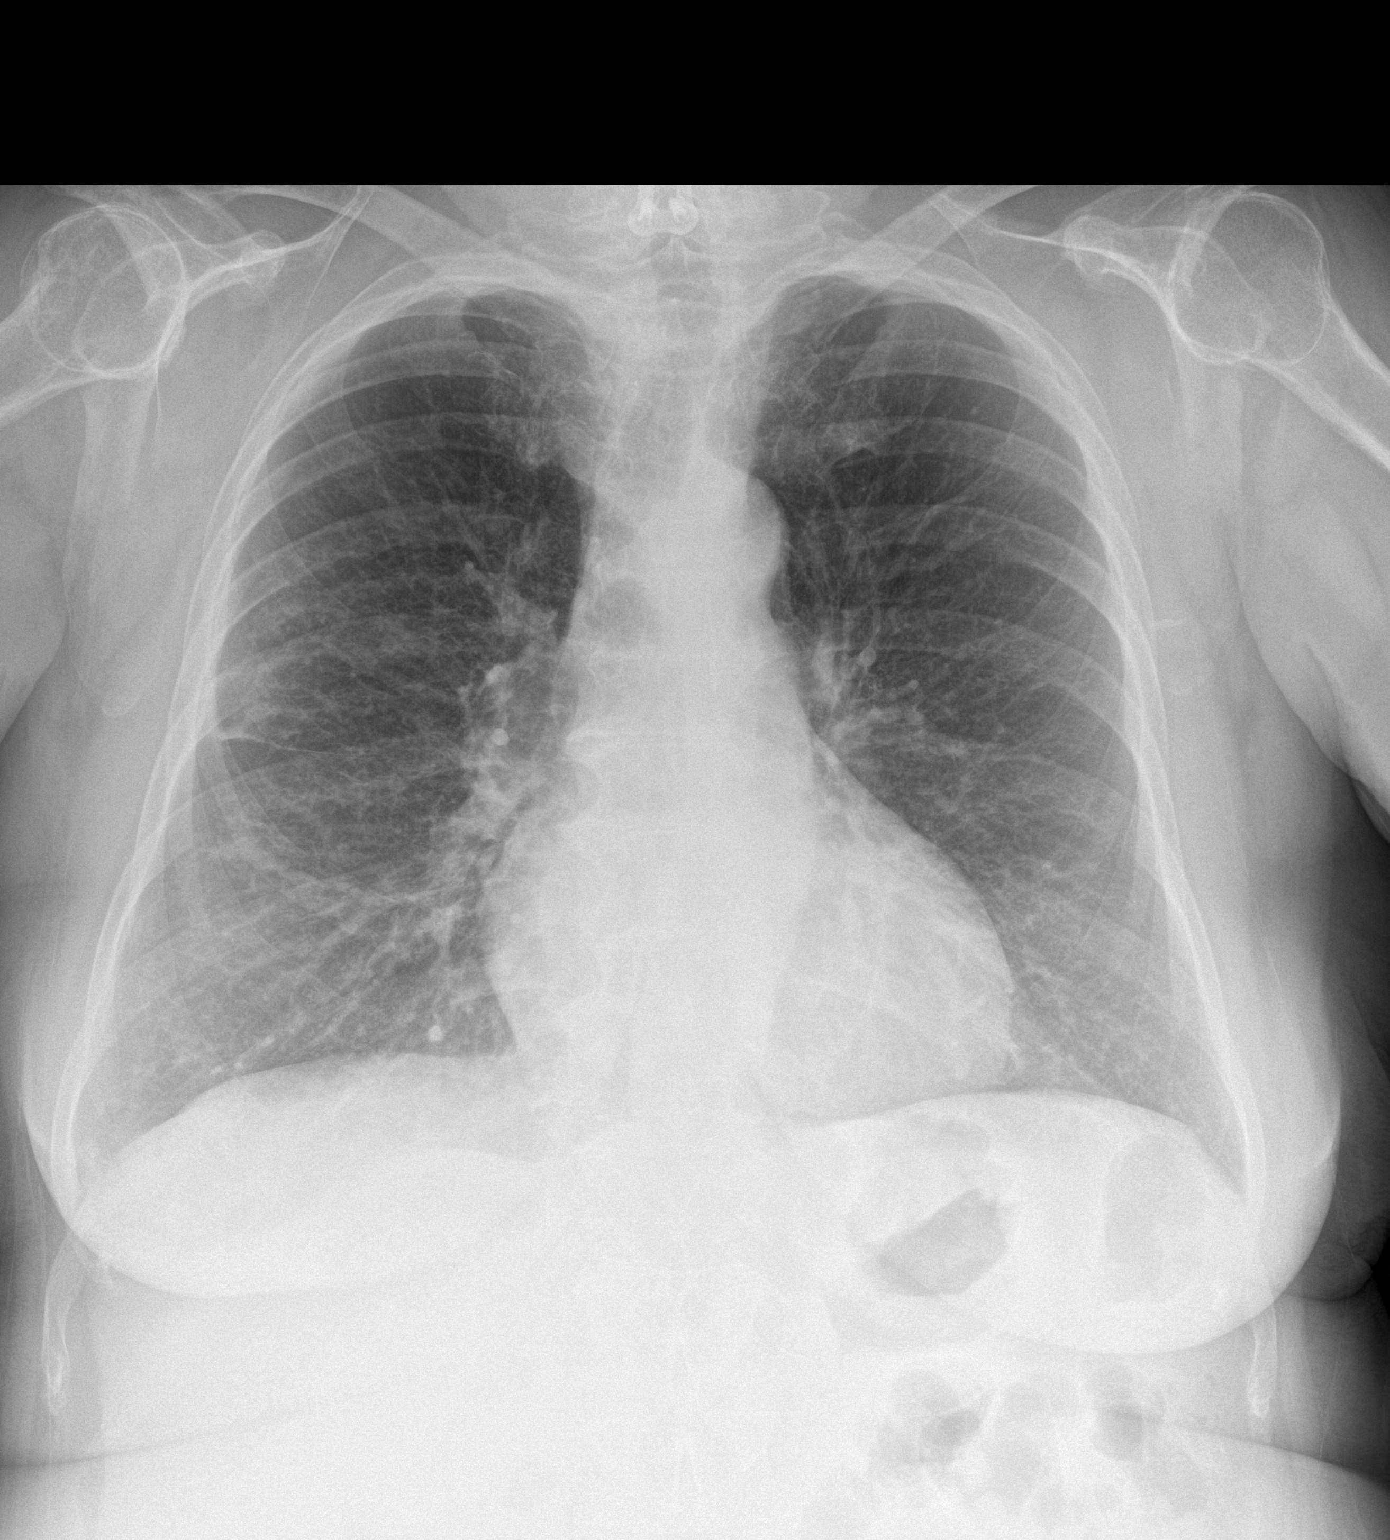
[im 2/2]
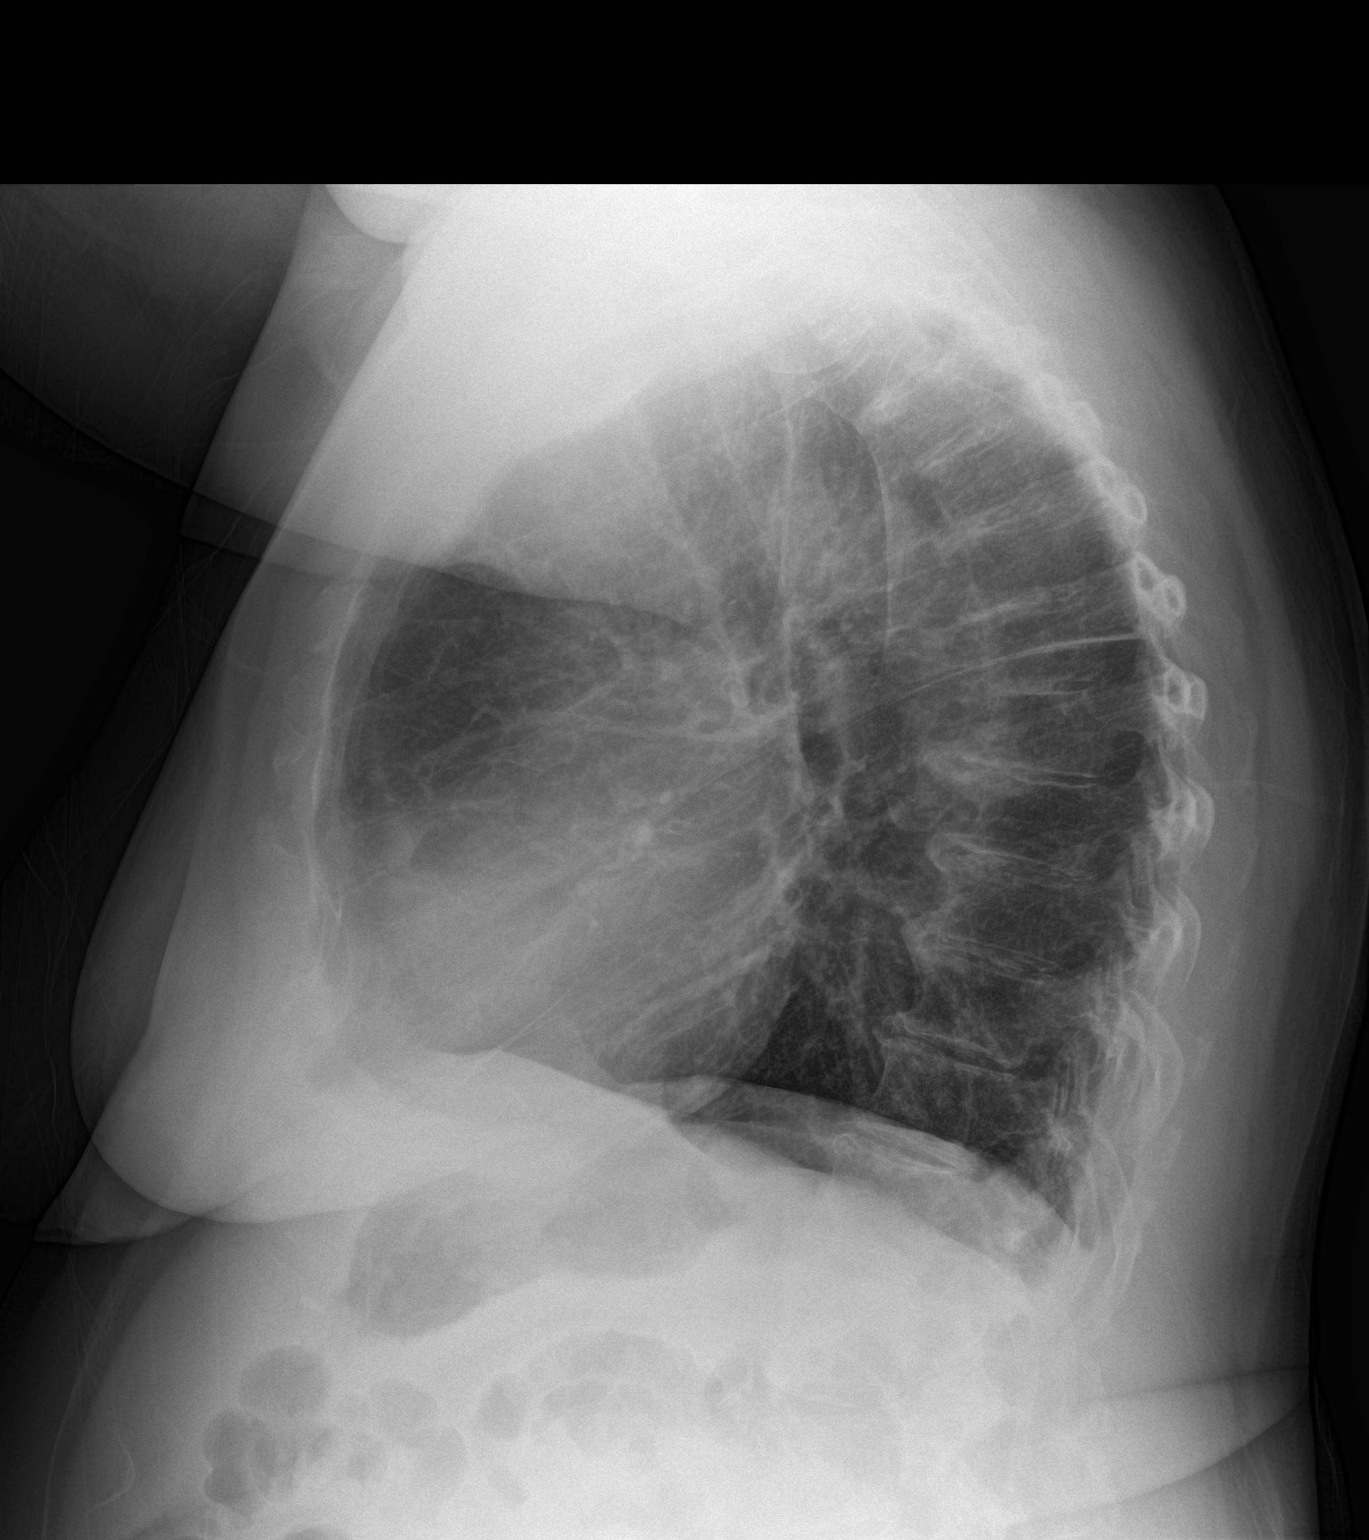

[2 of 2 positions shown; findings below may reference images not displayed]

FINDINGS: There are more prominent interstitial markings particularly on the
right. Although some of these markings could be acute, developing
interstitial lung disease is a consideration and CT of the chest is
recommended. Mediastinal and hilar contours are unremarkable. The
heart is borderline enlarged and stable. A lower anterior cervical
spine fusion plate is present. There are degenerative changes in the
mid to lower thoracic spine.
IMPRESSION: 1. Slight prominence of interstitial markings right greater than
left. Pneumonia is a consideration. However developing interstitial
lung disease cannot be excluded if symptoms persist. Recommend CT of
the chest to assess further.
2. Stable borderline cardiomegaly.

## 2015-02-21 MED ORDER — DOXYCYCLINE HYCLATE 50 MG PO CAPS: 100.0000 mg | ORAL_CAPSULE | Freq: Two times a day (BID) | ORAL | Status: DC

## 2015-02-21 NOTE — Patient Instructions (Signed)
Take the doxycycline twice a day for a week. Make sure you use suncreen when taking this and take a probiotic with yogurt with it. Take the cough medicine you have at home Take the albuterol 2-3 times a day for the next few weeks We will see you back in a month or sooner if needed

## 2015-02-21 NOTE — Assessment & Plan Note (Signed)
I have personally reviewed the images of the CT chest performed earlier this week. They show persistent tree in bud abnormalities in the right upper lobe. My suspicion is that this represents an atypical infection such as mycobacterial disease. She is at increased risk for this condition considering her underlying lung disease. 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 She is unable to provide [place] mucus today in clinic. The differential diagnosis of tree-in-bud abnormalities includes aspiration and less likely sarcoid, given the focal nature of these problems in the right upper lobe I think that is less likely.  Plan: -I'm going to treat her acute episode of bronchitis with doxycycline today, and then wait about 6 weeks before performing a bronchoscopy with BAL which we will send for culture to assess for atypical infections.

## 2015-02-21 NOTE — Progress Notes (Signed)
Subjective:    Patient ID: Andrea Stewart, female    DOB: [birth year], 69 y.o.   MRN: [id number]  Synopsis: Referred in March 2016 for evaluation of ongoing shortness of breath. She had a CT scan performed at [facility] in 2015 which demonstrated tree-in-bud abnormalities in the right upper lobe as well as a left lower lobe pulmonary nodule. A repeat CT scan was performed in January 2016 showing a right lower lobe pleural-based pulmonary nodule 1.27 m in size. This resolved on a April 2016 CT chest.  11/20/2014 CT chest> tree in bud, inflammatory appearing abnormalities in the right upper lobe, left upper lobe pleural-based 1.2 cm solid nodule 02/2015 CT chest > right upper lobe tree-in-bud abnormalities persistent, the pulmonary nodule seen on the previous study has resolved 01/2015 PFT> normal ratio but consistent with moderate obstruction (FEV1 1.16L (58% pred)), TLC 4.57L (99% pred), RV 133% pred, DLCO 27.6 (136% pred)  HPI Chief Complaint  Patient presents with  . Follow-up    Review CT chest.  Pt c/o sob with any exertion, prod cough with green mucus X3 days.     Vicke says that she has been having trouble breathing ever since Wednesday which is associated with cough productive of green sputum.  She says it started not long after she was exposed to a lot of pollen in her car.  She has been taking her inhalers as prescribed (symbicort two puffs bid) and has used albuterol.  She says that for over a year she has had symptoms of shortness of breath, cough, and intermittent mucus production. She denies weight loss or fevers or chills. She has been having increasing sinus symptoms recently.  Past Medical History  Diagnosis Date  . Myalgia and myositis, unspecified   . Plantar fascial fibromatosis   . Personal history of tobacco use, presenting hazards to health     1/2 ppd  . Degeneration of intervertebral disc, site unspecified   . Backache, unspecified     s/p c-spine and l-spine fusions  .  Obesity, unspecified   . Disorder of bone and cartilage, unspecified   . Unspecified hereditary and idiopathic peripheral neuropathy     secondary to diabetes  . Unspecified essential hypertension   . Type II or unspecified type diabetes mellitus without mention of complication, not stated as uncontrolled   . Depressive disorder, not elsewhere classified   . GERD (gastroesophageal reflux disease)   . S/p nephrectomy   . S/P cholecystectomy   . Hx of left heart catheterization by cutdown     a. 7 years ago; no significant cad; b. Lexiscan 2014: no significant ischemia, no significant EKG changes concerning for ischemia, EF 67%, overall low risk study  . HTN (hypertension)      Family History  Problem Relation Age of Onset  . Hypertension Mother   . Colon cancer Mother   . Cancer Mother     colon  . Coronary artery disease Mother   . Prostate cancer Father   . COPD Father   . Cancer Father     prostate  . Heart attack Brother 55  . Sleep apnea Brother      History   Social History  . Marital Status: Married    Spouse Name: N/A  . Number of Children: 4  . Years of Education: N/A   Occupational History  . Retired    Social History Main Topics  . Smoking status: Current Every Day Smoker -- 0.50 packs/day for 23  years    Types: Cigarettes  . Smokeless tobacco: Never Used     Comment: QUIT 08/31/2011 x 2 years and started back d/t stress in November 2015.   Marland Kitchen Alcohol Use: No  . Drug Use: No  . Sexual Activity: Not on file   Other Topics Concern  . Not on file   Social History Narrative   Lives in [place]. Disability because of back     Allergies  Allergen Reactions  . Morphine Anaphylaxis  . Gabapentin Other (See Comments)    intolerance  . Nsaids Diarrhea  . Penicillins Other (See Comments)    REACTION: rash, swelling. Remote reaction as a child to [place]. Patient did tolerate recent course of augmentin         Review of Systems  Constitutional:  Negative for fever, chills and fatigue.  HENT: Positive for postnasal drip and rhinorrhea. Negative for sinus pressure.   Respiratory: Positive for cough and shortness of breath. Negative for wheezing.   Cardiovascular: Negative for chest pain, palpitations and leg swelling.       Objective:   Physical Exam Filed Vitals:   02/21/15 1456  BP: 152/78  Pulse: 61  Temp: 98 F (36.7 C)  TempSrc: Oral  Height: 5\' 1"  (1.549 m)  Weight: 197 lb (89.359 kg)  SpO2: 94%   RA  Gen: wno acute distress HEENT: NCAT, EOMi, OP clear PULM: CTA B CV: RRR, no mgr, no JVD AB: BS+, soft, nontender,  Ext: warm, no edema, no clubbing, no cyanosis Derm: no rash or skin breakdown Neuro: A&Ox4, MAEW   02/19/2015 CT chest images reviewed> the pulmonary nodule in the LUL has resolved, there are tree-in-bud abnormalities in the RUL which are stable as well, no emphysema     Assessment & Plan:   COPD (chronic obstructive pulmonary disease) with chronic bronchitis She has moderate COPD and unfortunately today she is having an exacerbation of the same. Fortunately this is not a severe exacerbation. She has minimal wheezing on exam today but she has been experiencing increasing cough with mucus production. This is due to her previous smoking history.  Plan: -Continue Symbicort twice a day -Use albuterol 2-3 times a day for the next 3-4 days while she is getting over her exacerbation -I will prescribe doxycycline to be taken for one week, she was advised to take this with a probiotic and use sunscreen -Used [place] next as needed for cough, she has some at home   Abnormal CT scan, chest I have personally reviewed the images of the CT chest performed earlier this week. They show persistent tree in bud abnormalities in the right upper lobe. My suspicion is that this represents an atypical infection such as mycobacterial disease. She is at increased risk for this condition considering her underlying lung disease.  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 She is unable to provide [place] mucus today in clinic. The differential diagnosis of tree-in-bud abnormalities includes aspiration and less likely sarcoid, given the focal nature of these problems in the right upper lobe I think that is less likely.  Plan: -I'm going to treat her acute episode of bronchitis with doxycycline today, and then wait about 6 weeks before performing a bronchoscopy with BAL which we will send for culture to assess for atypical infections.     Updated Medication List Outpatient Encounter Prescriptions as of 02/21/2015  Medication Sig  . albuterol (PROVENTIL HFA) 108 (90 BASE) MCG/ACT  inhaler Inhale 2 puffs into the lungs every 4 (four) hours as needed for wheezing or shortness of breath.  Marland Kitchen aspirin 81 MG tablet Take 1 tablet (81 mg total) by mouth daily.  . benzonatate (TESSALON) 200 MG capsule Take 1 capsule (200 mg total) by mouth 3 (three) times daily as needed for cough.  . budesonide-formoterol (SYMBICORT) 160-4.5 MCG/ACT inhaler Inhale 2 puffs into the lungs 2 (two) times daily.  . calcium carbonate (OS-CAL) 600 MG TABS Take 600 mg by mouth daily.    . chlorpheniramine-HYDROcodone (TUSSIONEX PENNKINETIC ER) 10-8 MG/5ML LQCR Take 5 mLs by mouth every 12 (twelve) hours as needed for cough.  . cyclobenzaprine (FLEXERIL) 10 MG tablet   . DAILY MULTIPLE VITAMINS PO Take 1 tablet by mouth daily.    Marland Kitchen doxazosin (CARDURA) 2 MG tablet TAKE ONE-HALF (1/2) TABLET EVERY DAY  . fluticasone (VERAMYST) 27.5 MCG/SPRAY nasal spray Place 2 sprays into the nose as needed for rhinitis or allergies.   Marland Kitchen insulin lispro (HUMALOG) 100 UNIT/ML injection Inject into the skin as directed. Sliding scale 100-150 = 2-2.5 units 150-200 = 3-3.5 units 200-250 = 4-4.5 units 251-300 = 5-5.5 units 301-350 = 6-6.5 units 350-400 = 7-7.5 units  . insulin NPH (HUMULIN N,NOVOLIN N) 100 UNIT/ML  injection Inject 20 Units into the skin daily before breakfast. 22 units at bedtime  . metFORMIN (GLUCOPHAGE-XR) 500 MG 24 hr tablet Take 500 mg by mouth 2 (two) times daily.   . methocarbamol (ROBAXIN) 500 MG tablet Take 500 mg by mouth every 6 (six) hours as needed for muscle spasms.   . Misc Natural Products (TURMERIC CURCUMIN) CAPS 900mg  capsule daily  . mometasone (NASONEX) 50 MCG/ACT nasal spray Place 2 sprays into the nose daily.    . nitroGLYCERIN (NITROSTAT) 0.4 MG SL tablet Place 1 tablet (0.4 mg total) under the tongue every 5 (five) minutes as needed. Maximum 3 tablets (Patient taking differently: Place 0.4 mg under the tongue every 5 (five) minutes as needed for chest pain. Maximum 3 tablets)  . omeprazole (PRILOSEC) 40 MG capsule TAKE ONE CAPSULE TWICE A DAY  . sodium chloride (OCEAN) 0.65 % SOLN nasal spray Place 2 sprays into both nostrils as needed for congestion.  Marland Kitchen Spacer/Aero-Holding Chambers (AEROCHAMBER MV) inhaler Use as instructed  . traMADol (ULTRAM) 50 MG tablet Take 50 mg by mouth every 8 (eight) hours as needed for moderate pain or severe pain.  . valsartan-hydrochlorothiazide (DIOVAN-HCT) 320-25 MG per tablet Take 1 tablet by mouth daily. (Patient taking differently: Take 0.5 tablets by mouth 2 (two) times daily. )  . zolpidem (AMBIEN) 10 MG tablet Take 10 mg by mouth at bedtime as needed.    . doxycycline (VIBRAMYCIN) 50 MG capsule Take 2 capsules (100 mg total) by mouth 2 (two) times daily.  . [DISCONTINUED] Fluticasone-Salmeterol (ADVAIR DISKUS) 250-50 MCG/DOSE AEPB Inhale 1 puff into the lungs as needed (bronchitis symptoms).

## 2015-02-21 NOTE — Telephone Encounter (Signed)
Called pharmacy, amount of medication sent to pharmacy was incorrect.  Needed the proper amount called in.  Confirmed that patient should be taking medication for 1 week and not for just 3 days. Nothing further needed.

## 2015-02-21 NOTE — Assessment & Plan Note (Signed)
She has moderate COPD and unfortunately today she is having an exacerbation of the same. Fortunately this is not a severe exacerbation. She has minimal wheezing on exam today but she has been experiencing increasing cough with mucus production. This is due to her previous smoking history.  Plan: -Continue Symbicort twice a day -Use albuterol 2-3 times a day for the next 3-4 days while she is getting over her exacerbation -I will prescribe doxycycline to be taken for one week, she was advised to take this with a probiotic and use sunscreen -Used [place] next as needed for cough, she has some at home

## 2015-03-03 ENCOUNTER — Ambulatory Visit: Payer: Medicare Other | Admitting: Pulmonary Disease

## 2015-03-05 ENCOUNTER — Ambulatory Visit: Payer: Medicare Other | Admitting: Pulmonary Disease

## 2015-03-10 ENCOUNTER — Other Ambulatory Visit: Payer: Self-pay | Admitting: Cardiovascular Disease

## 2015-03-12 ENCOUNTER — Encounter: Payer: Self-pay | Admitting: Nurse Practitioner

## 2015-03-12 ENCOUNTER — Ambulatory Visit (INDEPENDENT_AMBULATORY_CARE_PROVIDER_SITE_OTHER): Payer: Medicare Other | Admitting: Nurse Practitioner

## 2015-03-12 VITALS — BP 128/82 | HR 86 | Temp 97.7°F | Resp 16 | Ht 61.0 in | Wt 195.8 lb

## 2015-03-12 DIAGNOSIS — J449 Chronic obstructive pulmonary disease, unspecified: Secondary | ICD-10-CM

## 2015-03-12 MED ORDER — GUAIFENESIN-CODEINE 100-10 MG/5ML PO SYRP: 5.0000 mL | ORAL_SOLUTION | Freq: Every day | ORAL | Status: DC

## 2015-03-12 MED ORDER — DOXYCYCLINE HYCLATE 50 MG PO CAPS: 100.0000 mg | ORAL_CAPSULE | Freq: Two times a day (BID) | ORAL | Status: DC

## 2015-03-12 NOTE — Assessment & Plan Note (Signed)
Another exacerbation. Minimal wheezes same as last time. Increasing cough and mucus which is described as green again. Pt is trying her inhalers, and previous prescription medications/otc meds with no relief. She is anticipating a bronchoscopy in May with Dr. Lake Bells. Doxy 500 mg twice daily for 5 days to stop in time for bronchoscopy. Cheratussin syrup was told to me previously by a pharmacist to be cheaper. The pt reports tussionex is 120 dollars.  She reports no trouble with codeine in past. Instructed probiotic use, continue with sunscreen, inhaler use, instructed to take 1 tsp of cough syrup at night.

## 2015-03-12 NOTE — Patient Instructions (Addendum)
Will try Doxycyline again before your procedure.   Please take a probiotic ( Align, Floraque or Culturelle) while you are on the antibiotic to prevent a serious antibiotic associated diarrhea  Called clostirudium dificile colitis and a vaginal yeast infection.

## 2015-03-12 NOTE — Progress Notes (Signed)
   Subjective:    Patient ID: Edgardo Roys, female    DOB: 1946/11/15, 69 y.o.   MRN: [id number]  HPI  Ms. Rushlow is a 69 yo female with a CC of cough and congestion.   1) Sinus frontal- Saturday, stopped after Saturday. Can't stop coughing at night, hacking cough, green sputum. Recent doxycyline use with relief while on it, cough came back afterwards. Saw Dr. Lake Bells last time and she is going for bronchoscopy with BAL.   Antihistamines- not helpful  Tessalon perles- not helpful  Cough syrup- Codeine- expensive helpful  Albuterol and symbicort twice daily- helpful  Nasonex- using   Review of Systems  Constitutional: Positive for fatigue. Negative for fever, chills and diaphoresis.  HENT: Positive for congestion, postnasal drip and sinus pressure. Negative for rhinorrhea, sneezing and sore throat.   Eyes: Negative for visual disturbance.  Respiratory: Positive for shortness of breath and wheezing. Negative for chest tightness.   Cardiovascular: Negative for chest pain, palpitations and leg swelling.  Gastrointestinal: Negative for nausea, vomiting and diarrhea.      Objective:   Physical Exam  Constitutional: She is oriented to person, place, and time. She appears well-developed and well-nourished. No distress.  BP 128/82 mmHg  Pulse 86  Temp(Src) 97.7 F (36.5 C) (Oral)  Resp 16  Ht 5\' 1"  (1.549 m)  Wt 195 lb 12.8 oz (88.814 kg)  BMI 37.02 kg/m2  SpO2 96%   HENT:  Head: Normocephalic and atraumatic.  Right Ear: External ear normal.  Left Ear: External ear normal.  Cardiovascular: Normal rate, regular rhythm, normal heart sounds and intact distal pulses.  Exam reveals no gallop and no friction rub.   No murmur heard. Pulmonary/Chest: Effort normal. No respiratory distress. She has wheezes. She has no rales. She exhibits no tenderness.  Neurological: She is alert and oriented to person, place, and time. No cranial nerve deficit. She exhibits normal muscle tone.  Coordination normal.  Skin: Skin is warm and dry. No rash noted. She is not diaphoretic.  Psychiatric: She has a normal mood and affect. Her behavior is normal. Judgment and thought content normal.     Assessment & Plan:

## 2015-03-12 NOTE — Progress Notes (Signed)
Pre visit review using our clinic review tool, if applicable. No additional management support is needed unless otherwise documented below in the visit note. 

## 2015-03-13 ENCOUNTER — Telehealth: Payer: Self-pay | Admitting: Internal Medicine

## 2015-03-27 ENCOUNTER — Encounter: Payer: Self-pay | Admitting: Pulmonary Disease

## 2015-03-27 ENCOUNTER — Other Ambulatory Visit
Admission: RE | Admit: 2015-03-27 | Discharge: 2015-03-27 | Disposition: A | Discharge status: Home / Self Care | Payer: Medicare Other | Source: Ambulatory Visit | Attending: Pulmonary Disease | Admitting: Pulmonary Disease

## 2015-03-27 ENCOUNTER — Ambulatory Visit (INDEPENDENT_AMBULATORY_CARE_PROVIDER_SITE_OTHER): Payer: Medicare Other | Admitting: Pulmonary Disease

## 2015-03-27 ENCOUNTER — Encounter: Payer: Medicare Other | Admitting: Pulmonary Disease

## 2015-03-27 VITALS — BP 142/76 | HR 67 | Temp 97.8°F | Ht 61.0 in | Wt 194.0 lb

## 2015-03-27 DIAGNOSIS — R0600 Dyspnea, unspecified: Secondary | ICD-10-CM | POA: Diagnosis not present

## 2015-03-27 DIAGNOSIS — Z862 Personal history of diseases of the blood and blood-forming organs and certain disorders involving the immune mechanism: Secondary | ICD-10-CM

## 2015-03-27 DIAGNOSIS — R938 Abnormal findings on diagnostic imaging of other specified body structures: Secondary | ICD-10-CM

## 2015-03-27 DIAGNOSIS — J449 Chronic obstructive pulmonary disease, unspecified: Secondary | ICD-10-CM

## 2015-03-27 DIAGNOSIS — R9389 Abnormal findings on diagnostic imaging of other specified body structures: Secondary | ICD-10-CM

## 2015-03-27 LAB — PROTIME-INR
INR: 1.01
Prothrombin Time: 13.5 seconds (ref 11.4–15.0)

## 2015-03-27 LAB — CBC WITH DIFFERENTIAL/PLATELET
BASOS ABS: 0 10*3/uL (ref 0–0.1)
Basophils Relative: 0 %
EOS ABS: 0 10*3/uL (ref 0–0.7)
Eosinophils Relative: 0 %
HCT: 36.4 % (ref 35.0–47.0)
Hemoglobin: 11.8 g/dL — ABNORMAL LOW (ref 12.0–16.0)
LYMPHS ABS: 2.4 10*3/uL (ref 1.0–3.6)
Lymphocytes Relative: 34 %
MCH: 29 pg (ref 26.0–34.0)
MCHC: 32.5 g/dL (ref 32.0–36.0)
MCV: 89.2 fL (ref 80.0–100.0)
Monocytes Absolute: 0.7 10*3/uL (ref 0.2–0.9)
Monocytes Relative: 10 %
NEUTROS PCT: 56 %
Neutro Abs: 4 10*3/uL (ref 1.4–6.5)
PLATELETS: 198 10*3/uL (ref 150–440)
RBC: 4.08 MIL/uL (ref 3.80–5.20)
RDW: 15.1 % — ABNORMAL HIGH (ref 11.5–14.5)
WBC: 7.1 10*3/uL (ref 3.6–11.0)

## 2015-03-27 NOTE — Assessment & Plan Note (Signed)
She has moderate COPD and does not appear to be an exacerbation today.  Plan: -Continue taking Symbicort

## 2015-03-27 NOTE — Progress Notes (Signed)
Subjective:    Patient ID: Andrea Stewart, female    DOB: [birth year], 69 y.o.   MRN: [id number]  Synopsis: Referred in March 2016 for evaluation of ongoing shortness of breath. She had a CT scan performed at [facility] in 2015 which demonstrated tree-in-bud abnormalities in the right upper lobe as well as a left lower lobe pulmonary nodule. A repeat CT scan was performed in January 2016 showing a right lower lobe pleural-based pulmonary nodule 1.27 m in size. This resolved on a April 2016 CT chest.  11/20/2014 CT chest> tree in bud, inflammatory appearing abnormalities in the right upper lobe, left upper lobe pleural-based 1.2 cm solid nodule 02/2015 CT chest > right upper lobe tree-in-bud abnormalities persistent, the pulmonary nodule seen on the previous study has resolved 01/2015 PFT> normal ratio but consistent with moderate obstruction (FEV1 1.16L (58% pred)), TLC 4.57L (99% pred), RV 133% pred, DLCO 27.6 (136% pred)  HPI Chief Complaint  Patient presents with  . Follow-up    Pt was put on another round of Doxy through Dr. Derrel Nip since last visit.  Pt c/o prod cough with green/yellow mucus.  pt states she does feel better since last visit.    She had to go to Dr. Derrel Nip for more doxycycline and a cough syrup.  She says that she still has trouble breathing at night and when she goes outside.   She took the antibiotics about a week ago.  She said that the round of antibiotics I gave her lasted about a week. NO fever or chills.  She continues to take the symbicort.     Past Medical History  Diagnosis Date  . Myalgia and myositis, unspecified   . Plantar fascial fibromatosis   . Personal history of tobacco use, presenting hazards to health     1/2 ppd  . Degeneration of intervertebral disc, site unspecified   . Backache, unspecified     s/p c-spine and l-spine fusions  . Obesity, unspecified   . Disorder of bone and cartilage, unspecified   . Unspecified hereditary and idiopathic peripheral  neuropathy     secondary to diabetes  . Unspecified essential hypertension   . Type II or unspecified type diabetes mellitus without mention of complication, not stated as uncontrolled   . Depressive disorder, not elsewhere classified   . GERD (gastroesophageal reflux disease)   . S/p nephrectomy   . S/P cholecystectomy   . Hx of left heart catheterization by cutdown     a. 7 years ago; no significant cad; b. Lexiscan 2014: no significant ischemia, no significant EKG changes concerning for ischemia, EF 67%, overall low risk study  . HTN (hypertension)       Review of Systems  Constitutional: Negative for fever, chills and fatigue.  HENT: Negative for postnasal drip, rhinorrhea and sinus pressure.   Respiratory: Positive for cough. Negative for shortness of breath and wheezing.   Cardiovascular: Negative for chest pain, palpitations and leg swelling.       Objective:   Physical Exam Filed Vitals:   03/27/15 1347  BP: 142/76  Pulse: 67  Temp: 97.8 F (36.6 C)  TempSrc: Oral  Height: 5\' 1"  (1.549 m)  Weight: 194 lb (87.998 kg)  SpO2: 99%   RA  Gen: no acute distress HEENT: NCAT, EOMi, OP clear PULM: Few crackles R base  CV: RRR, no mgr, no JVD AB: BS+, soft, nontender,  Ext: warm, no edema, no clubbing, no cyanosis Derm: no rash or skin breakdown  Neuro: A&Ox4, MAEW   02/19/2015 CT chest images reviewed again> the pulmonary nodule in the LUL has resolved, there are tree-in-bud abnormalities in the RUL which are stable as well, no emphysema 03/12/2015 primary care visit records reviewed she was treated with doxycycline and cough syrup for presumed COPD exacerbation>     Assessment & Plan:   Abnormal CT scan, chest Because of the tree in bud abnormalities on her CT chest in her ongoing cough with mucus production I am concerned about the possibility of atypical mycobacterial disease.    Plan: I plan a bronchoscopy on 04/02/2015 at [facility]. Today we  discussed the risks and benefits. I advised her stop taking the aspirin about 4 days before the procedure. I also advised that she not eat anything that morning.   We will have her follow-up 6-8 weeks after the bronchoscopy to follow-up the results of the AFB culture test   COPD (chronic obstructive pulmonary disease) with chronic bronchitis She has moderate COPD and does not appear to be an exacerbation today.  Plan: -Continue taking Symbicort     Updated Medication List Outpatient Encounter Prescriptions as of 03/27/2015  Medication Sig  . albuterol (PROVENTIL HFA) 108 (90 BASE) MCG/ACT inhaler Inhale 2 puffs into the lungs every 4 (four) hours as needed for wheezing or shortness of breath.  Marland Kitchen aspirin 81 MG tablet Take 1 tablet (81 mg total) by mouth daily.  . benzonatate (TESSALON) 200 MG capsule Take 1 capsule (200 mg total) by mouth 3 (three) times daily as needed for cough.  . budesonide-formoterol (SYMBICORT) 160-4.5 MCG/ACT inhaler Inhale 2 puffs into the lungs 2 (two) times daily.  . calcium carbonate (OS-CAL) 600 MG TABS Take 600 mg by mouth daily.    . cyclobenzaprine (FLEXERIL) 10 MG tablet   . DAILY MULTIPLE VITAMINS PO Take 1 tablet by mouth daily.    Marland Kitchen doxazosin (CARDURA) 2 MG tablet TAKE ONE-HALF (1/2) TABLET EVERY DAY  . fluticasone (VERAMYST) 27.5 MCG/SPRAY nasal spray Place 2 sprays into the nose as needed for rhinitis or allergies.   Marland Kitchen guaiFENesin-codeine (CHERATUSSIN AC) 100-10 MG/5ML syrup Take 5 mLs by mouth at bedtime.  . insulin lispro (HUMALOG) 100 UNIT/ML injection Inject into the skin as directed. Sliding scale 100-150 = 2-2.5 units 150-200 = 3-3.5 units 200-250 = 4-4.5 units 251-300 = 5-5.5 units 301-350 = 6-6.5 units 350-400 = 7-7.5 units  . insulin NPH (HUMULIN N,NOVOLIN N) 100 UNIT/ML injection Inject 20 Units into the skin daily before breakfast. 22 units at bedtime  . metFORMIN (GLUCOPHAGE-XR) 500 MG 24 hr tablet Take 500 mg by mouth 2 (two) times  daily.   . methocarbamol (ROBAXIN) 500 MG tablet Take 500 mg by mouth every 6 (six) hours as needed for muscle spasms.   . Misc Natural Products (TURMERIC CURCUMIN) CAPS 900mg  capsule daily  . mometasone (NASONEX) 50 MCG/ACT nasal spray Place 2 sprays into the nose daily.    . nitroGLYCERIN (NITROSTAT) 0.4 MG SL tablet Place 1 tablet (0.4 mg total) under the tongue every 5 (five) minutes as needed. Maximum 3 tablets (Patient taking differently: Place 0.4 mg under the tongue every 5 (five) minutes as needed for chest pain. Maximum 3 tablets)  . omeprazole (PRILOSEC) 40 MG capsule TAKE ONE CAPSULE TWICE A DAY  . sodium chloride (OCEAN) 0.65 % SOLN nasal spray Place 2 sprays into both nostrils as needed for congestion.  Marland Kitchen Spacer/Aero-Holding Chambers (AEROCHAMBER MV) inhaler Use as instructed  . traMADol (ULTRAM) 50  MG tablet Take 50 mg by mouth every 8 (eight) hours as needed for moderate pain or severe pain.  . valsartan-hydrochlorothiazide (DIOVAN-HCT) 320-25 MG per tablet Take 1 tablet by mouth daily. (Patient taking differently: Take 0.5 tablets by mouth 2 (two) times daily. )  . zolpidem (AMBIEN) 10 MG tablet Take 10 mg by mouth at bedtime as needed.    . [DISCONTINUED] chlorpheniramine-HYDROcodone (TUSSIONEX PENNKINETIC ER) 10-8 MG/5ML LQCR Take 5 mLs by mouth every 12 (twelve) hours as needed for cough. (Patient not taking: Reported on 03/27/2015)  . [DISCONTINUED] doxycycline (VIBRAMYCIN) 50 MG capsule Take 2 capsules (100 mg total) by mouth 2 (two) times daily. (Patient not taking: Reported on 03/27/2015)   No facility-administered encounter medications on file as of 03/27/2015.

## 2015-03-27 NOTE — Addendum Note (Signed)
Addended by: Len Blalock on: 03/27/2015 02:33 PM   Modules accepted: Orders

## 2015-03-27 NOTE — Assessment & Plan Note (Signed)
Because of the tree in bud abnormalities on her CT chest in her ongoing cough with mucus production I am concerned about the possibility of atypical mycobacterial disease.    Plan: I plan a bronchoscopy on 04/02/2015 at [facility]. Today we discussed the risks and benefits. I advised her stop taking the aspirin about 4 days before the procedure. I also advised that she not eat anything that morning.   We will have her follow-up 6-8 weeks after the bronchoscopy to follow-up the results of the AFB culture test

## 2015-03-27 NOTE — Addendum Note (Signed)
Addended by: Len Blalock on: 03/27/2015 02:32 PM   Modules accepted: Orders

## 2015-03-27 NOTE — Patient Instructions (Addendum)
We will arrange a bronchoscopy at [facility] next Wednesday 18th in the morning. Don't eat anything after midnight on the 17th Stop taking the ASA on Sunday and you can resume on the 19th We will obtain blood work before the test We will see you back in 7-8 weeks to follow up the results

## 2015-04-01 ENCOUNTER — Telehealth: Payer: Self-pay | Admitting: Pulmonary Disease

## 2015-04-01 NOTE — Telephone Encounter (Signed)
Notes Recorded by Juanito Doom, MD on 03/31/2015 at 2:48 PM A, Please let her know that this is OK Thanks B ----------------  Pt aware of lab results.

## 2015-04-02 ENCOUNTER — Ambulatory Visit (HOSPITAL_COMMUNITY)
Admission: RE | Admit: 2015-04-02 | Discharge: 2015-04-02 | Disposition: A | Discharge status: Home / Self Care | Payer: Medicare Other | Source: Ambulatory Visit | Attending: Pulmonary Disease | Admitting: Pulmonary Disease

## 2015-04-02 ENCOUNTER — Encounter (HOSPITAL_COMMUNITY)
Admission: RE | Disposition: A | Discharge status: Home / Self Care | Payer: Self-pay | Source: Ambulatory Visit | Attending: Pulmonary Disease

## 2015-04-02 ENCOUNTER — Encounter (HOSPITAL_COMMUNITY): Payer: Self-pay | Admitting: Radiology

## 2015-04-02 DIAGNOSIS — K219 Gastro-esophageal reflux disease without esophagitis: Secondary | ICD-10-CM | POA: Diagnosis not present

## 2015-04-02 DIAGNOSIS — F17210 Nicotine dependence, cigarettes, uncomplicated: Secondary | ICD-10-CM | POA: Insufficient documentation

## 2015-04-02 DIAGNOSIS — E119 Type 2 diabetes mellitus without complications: Secondary | ICD-10-CM | POA: Diagnosis not present

## 2015-04-02 DIAGNOSIS — E669 Obesity, unspecified: Secondary | ICD-10-CM | POA: Diagnosis not present

## 2015-04-02 DIAGNOSIS — I10 Essential (primary) hypertension: Secondary | ICD-10-CM | POA: Diagnosis not present

## 2015-04-02 DIAGNOSIS — Z888 Allergy status to other drugs, medicaments and biological substances status: Secondary | ICD-10-CM | POA: Diagnosis not present

## 2015-04-02 DIAGNOSIS — J449 Chronic obstructive pulmonary disease, unspecified: Secondary | ICD-10-CM | POA: Diagnosis not present

## 2015-04-02 DIAGNOSIS — R938 Abnormal findings on diagnostic imaging of other specified body structures: Secondary | ICD-10-CM | POA: Diagnosis not present

## 2015-04-02 DIAGNOSIS — Z885 Allergy status to narcotic agent status: Secondary | ICD-10-CM | POA: Insufficient documentation

## 2015-04-02 DIAGNOSIS — Z880 Allergy status to penicillin: Secondary | ICD-10-CM | POA: Insufficient documentation

## 2015-04-02 DIAGNOSIS — R9389 Abnormal findings on diagnostic imaging of other specified body structures: Secondary | ICD-10-CM

## 2015-04-02 DIAGNOSIS — F329 Major depressive disorder, single episode, unspecified: Secondary | ICD-10-CM | POA: Insufficient documentation

## 2015-04-02 HISTORY — PX: VIDEO BRONCHOSCOPY: SHX5072

## 2015-04-02 SURGERY — VIDEO BRONCHOSCOPY WITHOUT FLUORO
Anesthesia: Moderate Sedation | Laterality: Bilateral

## 2015-04-02 MED ORDER — LIDOCAINE HCL 1 % IJ SOLN
INTRAMUSCULAR | Status: DC | PRN
  Administered 2015-04-02: 6 mL

## 2015-04-02 MED ORDER — LIDOCAINE HCL 2 % EX GEL
CUTANEOUS | Status: DC | PRN
  Administered 2015-04-02: 1

## 2015-04-02 MED ORDER — SODIUM CHLORIDE 0.9 % IV SOLN
INTRAVENOUS | Status: DC
  Administered 2015-04-02: 07:00:00 via INTRAVENOUS

## 2015-04-02 MED ORDER — MIDAZOLAM HCL 5 MG/ML IJ SOLN
INTRAMUSCULAR | Status: AC
  Filled 2015-04-02: qty 2

## 2015-04-02 MED ORDER — FENTANYL CITRATE (PF) 100 MCG/2ML IJ SOLN
INTRAMUSCULAR | Status: DC | PRN
  Administered 2015-04-02: 50 ug via INTRAVENOUS

## 2015-04-02 MED ORDER — PHENYLEPHRINE HCL 0.25 % NA SOLN
NASAL | Status: DC | PRN
  Administered 2015-04-02: 2 via NASAL

## 2015-04-02 MED ORDER — FENTANYL CITRATE (PF) 100 MCG/2ML IJ SOLN
INTRAMUSCULAR | Status: AC
  Filled 2015-04-02: qty 4

## 2015-04-02 MED ORDER — MIDAZOLAM HCL 10 MG/2ML IJ SOLN
INTRAMUSCULAR | Status: DC | PRN
  Administered 2015-04-02: 2 mg via INTRAVENOUS

## 2015-04-02 NOTE — H&P (Signed)
[facility]  HPI: Andrea Stewart is here for a bronchoscopy. 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  She has been experiencing recurrent exacerbations of her airways disease this year.  She is feeling fine today.  Past Medical History  Diagnosis Date  . Myalgia and myositis, unspecified   . Plantar fascial fibromatosis   . Personal history of tobacco use, presenting hazards to health     1/2 ppd  . Degeneration of intervertebral disc, site unspecified   . Backache, unspecified     s/p c-spine and l-spine fusions  . Obesity, unspecified   . Disorder of bone and cartilage, unspecified   . Unspecified hereditary and idiopathic peripheral neuropathy     secondary to diabetes  . Unspecified essential hypertension   . Type II or unspecified type diabetes mellitus without mention of complication, not stated as uncontrolled   . Depressive disorder, not elsewhere classified   . GERD (gastroesophageal reflux disease)   . S/p nephrectomy   . S/P cholecystectomy   . Hx of left heart catheterization by cutdown     a. 7 years ago; no significant cad; b. Lexiscan 2014: no significant ischemia, no significant EKG changes concerning for ischemia, EF 67%, overall low risk study  . HTN (hypertension)      Family History  Problem Relation Age of Onset  . Hypertension Mother   . Colon cancer Mother   . Cancer Mother     colon  . Coronary artery disease Mother   . Prostate cancer Father   . COPD Father   . Cancer Father     prostate  . Heart attack Brother 73  . Sleep apnea Brother      History   Social History  . Marital Status: Married    Spouse Name: N/A  . Number of Children: 4  . Years of Education: N/A   Occupational History  . Retired    Social History Main Topics  . Smoking status: Current Every Day Smoker -- 0.50 packs/day for 23 years    Types: Cigarettes  . Smokeless tobacco: Never Used     Comment: QUIT 08/31/2011 x 2 years and started back d/t stress in November 2015.    Marland Kitchen Alcohol Use: No  . Drug Use: No  . Sexual Activity: Not on file   Other Topics Concern  . Not on file   Social History Narrative   Lives in [place]. Disability because of back     Allergies  Allergen Reactions  . Morphine Anaphylaxis  . Prednisone Other (See Comments)    "makes me think I am having a heart attack"  . Gabapentin Other (See Comments)    intolerance  . Nsaids Diarrhea  . Penicillins Other (See Comments)    REACTION: rash, swelling. Remote reaction as a child to [place]. Patient did tolerate recent course of augmentin      @encmedstart @  Filed Vitals:   04/02/15 0700 04/02/15 0705 04/02/15 0710 04/02/15 0715  BP: 190/88 177/79 182/152 188/74  Pulse: 42 40 42 59  Temp:      TempSrc:      Resp: 16 17 13 18   Height:      Weight:      SpO2: 97% 96% 97% 97%   Gen: well appearing HENT: OP clear, TM's clear, neck supple PULM: CTA B, normal percussion CV: RRR, no mgr, trace edema GI: BS+, soft, nontender Derm: no cyanosis or rash Psyche: normal mood and affect  Impression: Abnormal CT chest suggestive of MAI in a patient with recurrent COPD exacerbations  Plan: Bronchoscopy for BAL  Roselie Awkward, MD [facility] Pager: [phone] Cell: [phone] If no response, call [phone]

## 2015-04-02 NOTE — Progress Notes (Signed)
Video bronchoscopy with washing interventaion, Pt. . Did well thru out procedure all vitals good. IV removed and intact. Gave discharge instructions to husband and took out in wheel chair. Was told to resume meds in afternoon.

## 2015-04-02 NOTE — Op Note (Signed)
PCCM Bronchoscopy Procedure Note  The patient was informed of the risks (including but not limited to bleeding, infection, respiratory failure, lung injury, tooth/oral injury) and benefits of the procedure and gave consent, see chart.  Indication: Abnormal CT chest  Location: [facility]  Condition pre procedure: stable  Medications for procedure: Versed 2mg , Fentanyl 40mcg IV, Topical Lidocaine 1% 12 cc  Procedure description: The bronchoscope was introduced through the nose and passed to the bilateral lungs to the level of the subsegmental bronchi throughout the tracheobronchial tree.  Airway exam revealed normal appearing and functioning larynx, normal trachea and the carina was sharp.  The left and right tracheobronchial trees were normal in appearance without secretions, airway lesion or inflammation.  There were no endobronchial masses.  Procedures performed:  1) BAL RUL anterior segment: 60cc instilled, > 20cc slightly cloudy return  Specimens sent:  1) BAL: bacterial, fungal, AFB culture  Condition post procedure: Stable   EBL: None  Complications: None immediate  Patient instructions: We will call you with the results of the culture  Post procedure diagnosis: normal bronchoscopy, abnormal CT chest   Roselie Awkward, MD [facility] Pager: [phone] Cell: [phone] If no response, call [phone]

## 2015-04-02 NOTE — Progress Notes (Signed)
Pt. Blood sugar taken at 0705/ 187.

## 2015-04-03 ENCOUNTER — Encounter (HOSPITAL_COMMUNITY): Payer: Self-pay | Admitting: Pulmonary Disease

## 2015-04-04 LAB — CULTURE, RESPIRATORY: SPECIAL REQUESTS: NORMAL

## 2015-04-04 LAB — CULTURE, RESPIRATORY W GRAM STAIN

## 2015-04-14 ENCOUNTER — Other Ambulatory Visit: Payer: Self-pay | Admitting: Cardiovascular Disease

## 2015-04-30 ENCOUNTER — Other Ambulatory Visit: Payer: Self-pay | Admitting: *Deleted

## 2015-04-30 LAB — FUNGUS CULTURE W SMEAR
Fungal Smear: NONE SEEN
Special Requests: NORMAL

## 2015-04-30 MED ORDER — DOXAZOSIN MESYLATE 2 MG PO TABS: ORAL_TABLET | ORAL | Status: DC

## 2015-05-05 DIAGNOSIS — E780 Pure hypercholesterolemia: Secondary | ICD-10-CM | POA: Diagnosis not present

## 2015-05-05 DIAGNOSIS — E1165 Type 2 diabetes mellitus with hyperglycemia: Secondary | ICD-10-CM | POA: Diagnosis not present

## 2015-05-05 DIAGNOSIS — I10 Essential (primary) hypertension: Secondary | ICD-10-CM | POA: Diagnosis not present

## 2015-05-05 DIAGNOSIS — R809 Proteinuria, unspecified: Secondary | ICD-10-CM | POA: Diagnosis not present

## 2015-05-05 DIAGNOSIS — G609 Hereditary and idiopathic neuropathy, unspecified: Secondary | ICD-10-CM | POA: Diagnosis not present

## 2015-05-05 LAB — BASIC METABOLIC PANEL
BUN: 16 mg/dL (ref 4–21)
CREATININE: 1.6 mg/dL — AB (ref 0.5–1.1)
Glucose: 165 mg/dL
POTASSIUM: 4.2 mmol/L (ref 3.4–5.3)
Sodium: 140 mmol/L (ref 137–147)

## 2015-05-07 ENCOUNTER — Telehealth: Payer: Self-pay | Admitting: *Deleted

## 2015-05-07 DIAGNOSIS — R3129 Other microscopic hematuria: Secondary | ICD-10-CM

## 2015-05-07 NOTE — Telephone Encounter (Signed)
Pt states she saw here endocrinologist  yesterday and that there was a trace a blood in her urine and they said they would like for her to be referred to a urologist and pt states she would prefer [facility] in [place]   Pt cell: [phone]

## 2015-05-08 ENCOUNTER — Telehealth: Payer: Self-pay | Admitting: Internal Medicine

## 2015-05-08 ENCOUNTER — Other Ambulatory Visit (INDEPENDENT_AMBULATORY_CARE_PROVIDER_SITE_OTHER): Payer: Medicare Other

## 2015-05-08 DIAGNOSIS — R312 Other microscopic hematuria: Secondary | ICD-10-CM

## 2015-05-08 DIAGNOSIS — R3129 Other microscopic hematuria: Secondary | ICD-10-CM

## 2015-05-08 NOTE — Telephone Encounter (Signed)
please have her come by for the usual trio of urine tests,  i will not refer a patient i have not evaluated with at least the formal urine study.

## 2015-05-08 NOTE — Telephone Encounter (Signed)
The micro urinalysis from  Dr Chalmers Cater did NOT show any blood. So I will need more information abefore I can refer to urology. please have her make an appt

## 2015-05-08 NOTE — Telephone Encounter (Signed)
Spoke to pt, denies any urinary symptoms. Dr. Chalmers Cater is her endocrinologist, states no other signs of infection in UA, just trace blood. States has some mid lower back pain, has had in past. Please advise

## 2015-05-08 NOTE — Telephone Encounter (Signed)
Labs ordered.

## 2015-05-08 NOTE — Telephone Encounter (Signed)
Pt notified and  verbalized understanding. She will come by this afternoon to leave urine sample. Requested recent labs from Dr. Almetta Lovely office.

## 2015-05-09 ENCOUNTER — Encounter: Payer: Self-pay | Admitting: Internal Medicine

## 2015-05-09 LAB — URINALYSIS, ROUTINE W REFLEX MICROSCOPIC
Bilirubin Urine: NEGATIVE
Ketones, ur: NEGATIVE
Leukocytes, UA: NEGATIVE
Nitrite: NEGATIVE
PH: 6.5 (ref 5.0–8.0)
Specific Gravity, Urine: 1.02 (ref 1.000–1.030)
URINE GLUCOSE: NEGATIVE
Urobilinogen, UA: 0.2 (ref 0.0–1.0)

## 2015-05-09 NOTE — Telephone Encounter (Signed)
Spoke with pt, scheduled appoint to discuss

## 2015-05-10 LAB — URINE CULTURE: Colony Count: >=100000

## 2015-05-11 ENCOUNTER — Encounter: Payer: Self-pay | Admitting: Internal Medicine

## 2015-05-12 ENCOUNTER — Other Ambulatory Visit: Payer: Self-pay

## 2015-05-12 ENCOUNTER — Ambulatory Visit (INDEPENDENT_AMBULATORY_CARE_PROVIDER_SITE_OTHER): Payer: Medicare Other | Admitting: Pulmonary Disease

## 2015-05-12 ENCOUNTER — Encounter: Payer: Self-pay | Admitting: Pulmonary Disease

## 2015-05-12 VITALS — BP 130/60 | HR 44 | Ht 61.0 in | Wt 196.0 lb

## 2015-05-12 DIAGNOSIS — R9389 Abnormal findings on diagnostic imaging of other specified body structures: Secondary | ICD-10-CM

## 2015-05-12 DIAGNOSIS — R938 Abnormal findings on diagnostic imaging of other specified body structures: Secondary | ICD-10-CM | POA: Diagnosis not present

## 2015-05-12 DIAGNOSIS — J449 Chronic obstructive pulmonary disease, unspecified: Secondary | ICD-10-CM

## 2015-05-12 MED ORDER — BUDESONIDE-FORMOTEROL FUMARATE 160-4.5 MCG/ACT IN AERO: 2.0000 | INHALATION_SPRAY | Freq: Two times a day (BID) | RESPIRATORY_TRACT | Status: DC

## 2015-05-12 NOTE — Progress Notes (Signed)
Subjective:    Patient ID: Andrea Stewart, female    DOB: 12/22/1945, 70 y.o.   MRN: [id number]  Synopsis: Referred in March 2016 for evaluation of ongoing shortness of breath. She had a CT scan performed at [facility] in 2015 which demonstrated tree-in-bud abnormalities in the right upper lobe as well as a left lower lobe pulmonary nodule. A repeat CT scan was performed in January 2016 showing a right lower lobe pleural-based pulmonary nodule 1.27 m in size. This resolved on a April 2016 CT chest.  11/20/2014 CT chest> tree in bud, inflammatory appearing abnormalities in the right upper lobe, left upper lobe pleural-based 1.2 cm solid nodule 02/2015 CT chest > right upper lobe tree-in-bud abnormalities persistent, the pulmonary nodule seen on the previous study has resolved 01/2015 PFT> normal ratio but consistent with moderate obstruction (FEV1 1.16L (58% pred)), TLC 4.57L (99% pred), RV 133% pred, DLCO 27.6 (136% pred)  HPI Chief Complaint  Patient presents with  . Follow-up    review bronch results.  Pt c/o labored breathing.     Andrea Stewart has been doing OK.  She says taht she is still having breathing difficulty. She says taht going outside is difficult particularly when it is hot.  No antibiotics since we saw her last and performed the bronchoscopy.  Her diabetes doctor said that she had hematuria.  She says taht she has been feeling better though overall. She has not been coughing.  She is taking her Symbicort.   Past Medical History  Diagnosis Date  . Myalgia and myositis, unspecified   . Plantar fascial fibromatosis   . Personal history of tobacco use, presenting hazards to health     1/2 ppd  . Degeneration of intervertebral disc, site unspecified   . Backache, unspecified     s/p c-spine and l-spine fusions  . Obesity, unspecified   . Disorder of bone and cartilage, unspecified   . Unspecified hereditary and idiopathic peripheral neuropathy     secondary to diabetes  . Unspecified  essential hypertension   . Type II or unspecified type diabetes mellitus without mention of complication, not stated as uncontrolled   . Depressive disorder, not elsewhere classified   . GERD (gastroesophageal reflux disease)   . S/p nephrectomy   . S/P cholecystectomy   . Hx of left heart catheterization by cutdown     a. 7 years ago; no significant cad; b. Lexiscan 2014: no significant ischemia, no significant EKG changes concerning for ischemia, EF 67%, overall low risk study  . HTN (hypertension)       Review of Systems  Constitutional: Negative for fever, chills and fatigue.  HENT: Negative for postnasal drip, rhinorrhea and sinus pressure.   Respiratory: Positive for shortness of breath. Negative for cough and wheezing.   Cardiovascular: Negative for chest pain, palpitations and leg swelling.       Objective:   Physical Exam Filed Vitals:   05/12/15 1553  BP: 130/60  Pulse: 44  Height: 5\' 1"  (1.549 m)  Weight: 196 lb (88.905 kg)  SpO2: 94%   RA  Gen: no acute distress HEENT: NCAT, EOMi, OP clear PULM: Few crackles R base  CV: RRR, no mgr, no JVD AB: BS+, soft, nontender,  Ext: warm, no edema, no clubbing, no cyanosis Derm: no rash or skin breakdown Neuro: A&Ox4, MAEW   Jan 2016 CT chest images > the pulmonary nodule in the LUL has resolved, there are tree-in-bud abnormalities in the RUL which are stable as  well, no emphysema      Assessment & Plan:   COPD (chronic obstructive pulmonary disease) with chronic bronchitis She has moderate airflow obstruction but fairly severe symptoms. Fortunately there is no evidence of mycobacterial disease from the recent BAL. I explained to her that the severity of her symptoms recently are likely explained by a severe exacerbation of COPD associated with deconditioning. It sounds as if compliance has been a bit of an issue as she let her medication prescription run out recently.  Plan: Pulmonary rehabilitation  referral Continue Symbicort with a spacer, I reminded her that compliance with this is necessary  Abnormal CT scan, chest On the April 2016 CT chest the pulmonary nodule had resolved but the tree in bud abnormalities remained. We evaluated this with a bronchoscopy. To date, the cultures from the BAL are negative so there is no clear evidence of ongoing infection.   Plan: We will follow these cultures to completion to ensure that there is no evidence of mycobacterial disease.    Updated Medication List Outpatient Encounter Prescriptions as of 05/12/2015  Medication Sig  . albuterol (PROVENTIL HFA) 108 (90 BASE) MCG/ACT inhaler Inhale 2 puffs into the lungs every 4 (four) hours as needed for wheezing or shortness of breath.  Marland Kitchen aspirin 81 MG tablet Take 1 tablet (81 mg total) by mouth daily.  . benzonatate (TESSALON) 200 MG capsule Take 1 capsule (200 mg total) by mouth 3 (three) times daily as needed for cough.  . budesonide-formoterol (SYMBICORT) 160-4.5 MCG/ACT inhaler Inhale 2 puffs into the lungs 2 (two) times daily.  . calcium carbonate (OS-CAL) 600 MG TABS Take 600 mg by mouth daily.    . cyclobenzaprine (FLEXERIL) 10 MG tablet   . DAILY MULTIPLE VITAMINS PO Take 1 tablet by mouth daily.    Marland Kitchen doxazosin (CARDURA) 2 MG tablet TAKE ONE-HALF (1/2) TABLET EVERY DAY  . fluticasone (VERAMYST) 27.5 MCG/SPRAY nasal spray Place 2 sprays into the nose as needed for rhinitis or allergies.   Marland Kitchen guaiFENesin-codeine (CHERATUSSIN AC) 100-10 MG/5ML syrup Take 5 mLs by mouth at bedtime.  . insulin lispro (HUMALOG) 100 UNIT/ML injection Inject into the skin as directed. Sliding scale 100-150 = 2-2.5 units 150-200 = 3-3.5 units 200-250 = 4-4.5 units 251-300 = 5-5.5 units 301-350 = 6-6.5 units 350-400 = 7-7.5 units  . insulin NPH (HUMULIN N,NOVOLIN N) 100 UNIT/ML injection Inject 20 Units into the skin daily before breakfast. 22 units at bedtime  . metFORMIN (GLUCOPHAGE-XR) 500 MG 24 hr tablet Take 500  mg by mouth 2 (two) times daily.   . methocarbamol (ROBAXIN) 500 MG tablet Take 500 mg by mouth every 6 (six) hours as needed for muscle spasms.   . Misc Natural Products (TURMERIC CURCUMIN) CAPS 900mg  capsule daily  . mometasone (NASONEX) 50 MCG/ACT nasal spray Place 2 sprays into the nose daily.    . nitroGLYCERIN (NITROSTAT) 0.4 MG SL tablet Place 1 tablet (0.4 mg total) under the tongue every 5 (five) minutes as needed. Maximum 3 tablets (Patient taking differently: Place 0.4 mg under the tongue every 5 (five) minutes as needed for chest pain. Maximum 3 tablets)  . omeprazole (PRILOSEC) 40 MG capsule TAKE ONE CAPSULE TWICE A DAY  . sodium chloride (OCEAN) 0.65 % SOLN nasal spray Place 2 sprays into both nostrils as needed for congestion.  Marland Kitchen Spacer/Aero-Holding Chambers (AEROCHAMBER MV) inhaler Use as instructed  . traMADol (ULTRAM) 50 MG tablet Take 50 mg by mouth every 8 (eight) hours as needed for  moderate pain or severe pain.  . valsartan-hydrochlorothiazide (DIOVAN-HCT) 320-25 MG per tablet TAKE ONE (1) TABLET EACH DAY  . zolpidem (AMBIEN) 10 MG tablet Take 10 mg by mouth at bedtime as needed.    . [DISCONTINUED] budesonide-formoterol (SYMBICORT) 160-4.5 MCG/ACT inhaler Inhale 2 puffs into the lungs 2 (two) times daily.   No facility-administered encounter medications on file as of 05/12/2015.

## 2015-05-12 NOTE — Assessment & Plan Note (Signed)
On the April 2016 CT chest the pulmonary nodule had resolved but the tree in bud abnormalities remained. We evaluated this with a bronchoscopy. To date, the cultures from the BAL are negative so there is no clear evidence of ongoing infection.   Plan: We will follow these cultures to completion to ensure that there is no evidence of mycobacterial disease.

## 2015-05-12 NOTE — Assessment & Plan Note (Signed)
She has moderate airflow obstruction but fairly severe symptoms. Fortunately there is no evidence of mycobacterial disease from the recent BAL. I explained to her that the severity of her symptoms recently are likely explained by a severe exacerbation of COPD associated with deconditioning. It sounds as if compliance has been a bit of an issue as she let her medication prescription run out recently.  Plan: Pulmonary rehabilitation referral Continue Symbicort with a spacer, I reminded her that compliance with this is necessary

## 2015-05-12 NOTE — Patient Instructions (Signed)
We will refer you to pulmonary rehabilitation at [place] [facility] taking the Symbicort as you're doing We will see you back in 3 months or sooner if needed

## 2015-05-13 ENCOUNTER — Ambulatory Visit (INDEPENDENT_AMBULATORY_CARE_PROVIDER_SITE_OTHER): Payer: Medicare Other | Admitting: Internal Medicine

## 2015-05-13 ENCOUNTER — Encounter: Payer: Self-pay | Admitting: Internal Medicine

## 2015-05-13 VITALS — BP 140/78 | HR 77 | Temp 98.4°F | Resp 14 | Ht 62.0 in | Wt 195.5 lb

## 2015-05-13 DIAGNOSIS — E785 Hyperlipidemia, unspecified: Secondary | ICD-10-CM | POA: Diagnosis not present

## 2015-05-13 DIAGNOSIS — N183 Chronic kidney disease, stage 3 unspecified: Secondary | ICD-10-CM | POA: Insufficient documentation

## 2015-05-13 DIAGNOSIS — E1121 Type 2 diabetes mellitus with diabetic nephropathy: Secondary | ICD-10-CM | POA: Diagnosis not present

## 2015-05-13 DIAGNOSIS — R300 Dysuria: Secondary | ICD-10-CM | POA: Diagnosis not present

## 2015-05-13 LAB — BASIC METABOLIC PANEL
BUN: 24 mg/dL — ABNORMAL HIGH (ref 6–23)
CHLORIDE: 103 meq/L (ref 96–112)
CO2: 27 meq/L (ref 19–32)
CREATININE: 1.43 mg/dL — AB (ref 0.40–1.20)
Calcium: 9.3 mg/dL (ref 8.4–10.5)
GFR: 38.56 mL/min — ABNORMAL LOW (ref >60.00)
Glucose, Bld: 275 mg/dL — ABNORMAL HIGH (ref 70–99)
POTASSIUM: 4.5 meq/L (ref 3.5–5.1)
Sodium: 136 mEq/L (ref 135–145)

## 2015-05-13 LAB — LIPID PANEL
Cholesterol: 181 mg/dL (ref 0–200)
HDL: 66.9 mg/dL (ref >39.00)
LDL CALC: 92 mg/dL (ref 0–99)
NonHDL: 114.1
TRIGLYCERIDES: 110 mg/dL (ref 0.0–149.0)
Total CHOL/HDL Ratio: 3
VLDL: 22 mg/dL (ref 0.0–40.0)

## 2015-05-13 NOTE — Progress Notes (Signed)
Pre-visit discussion using our clinic review tool. No additional management support is needed unless otherwise documented below in the visit note.  

## 2015-05-13 NOTE — Progress Notes (Signed)
Subjective:urinalysis by her endocri  Patient ID: Andrea Stewart, female    DOB: [birth year]  Age: 69 y.o. MRN: [id number]  CC: The primary encounter diagnosis was Dysuria. Diagnoses of CKD (chronic kidney disease), stage III, Hyperlipidemia, and Diabetic nephropathy associated with type 2 diabetes mellitus were also pertinent to this visit.  HPI [name] presents for urology referral.  Patient states that she had a urinalysis with micro by her  endocrinologist two and was told she had blood in her urine and to see a urologist.  Labs reviewed,  Her urine mmicroscopic had 0-2 RBCs per field.  Micro was repeated by me  And the same results were noted,  She denies gross hematuria, use of NSAIDs,  Any sympttoms of cystitis.  RF for renal cell CA includd ongoing tobacco abuse.   Decreased kidney function.   GFR  was lower  Last week . 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  Marland Kitchen  Not using NSAIDs  Outpatient Prescriptions Prior to Visit  Medication Sig Dispense Refill  . albuterol (PROVENTIL HFA) 108 (90 BASE) MCG/ACT inhaler Inhale 2 puffs into the lungs every 4 (four) hours as needed for wheezing or shortness of breath. 1 Inhaler 2  . aspirin 81 MG tablet Take 1 tablet (81 mg total) by mouth daily.    . benzonatate (TESSALON) 200 MG capsule Take 1 capsule (200 mg total) by mouth 3 (three) times daily as needed for cough. 60 capsule 1  . budesonide-formoterol (SYMBICORT) 160-4.5 MCG/ACT inhaler Inhale 2 puffs into the lungs 2 (two) times daily. 1 Inhaler 5  . calcium carbonate (OS-CAL) 600 MG TABS Take 600 mg by mouth daily.      . cyclobenzaprine (FLEXERIL) 10 MG tablet     . DAILY MULTIPLE VITAMINS PO Take 1 tablet by mouth daily.      Marland Kitchen doxazosin (CARDURA) 2 MG tablet TAKE ONE-HALF (1/2) TABLET EVERY DAY 15 tablet 0  . fluticasone (VERAMYST) 27.5 MCG/SPRAY nasal spray Place 2 sprays into the nose as needed for  rhinitis or allergies.     Marland Kitchen guaiFENesin-codeine (CHERATUSSIN AC) 100-10 MG/5ML syrup Take 5 mLs by mouth at bedtime. 120 mL 0  . insulin lispro (HUMALOG) 100 UNIT/ML injection Inject into the skin as directed. Sliding scale 100-150 = 2-2.5 units 150-200 = 3-3.5 units 200-250 = 4-4.5 units 251-300 = 5-5.5 units 301-350 = 6-6.5 units 350-400 = 7-7.5 units    . insulin NPH (HUMULIN N,NOVOLIN N) 100 UNIT/ML injection Inject 20 Units into the skin daily before breakfast. 22 units at bedtime    . metFORMIN (GLUCOPHAGE-XR) 500 MG 24 hr tablet Take 500 mg by mouth 2 (two) times daily.     . methocarbamol (ROBAXIN) 500 MG tablet Take 500 mg by mouth every 6 (six) hours as needed for muscle spasms.     . Misc Natural Products (TURMERIC CURCUMIN) CAPS 900mg  capsule daily    . mometasone (NASONEX) 50 MCG/ACT nasal spray Place 2 sprays into the nose daily.      . nitroGLYCERIN (NITROSTAT) 0.4 MG SL tablet Place 1 tablet (0.4 mg total) under the tongue every 5 (five) minutes as needed. Maximum 3 tablets (Patient taking differently: Place 0.4 mg under the tongue every 5 (five) minutes as needed for chest pain. Maximum 3 tablets) 25 tablet 6  . omeprazole (PRILOSEC) 40 MG capsule TAKE ONE CAPSULE TWICE A DAY 60 capsule 3  .  sodium chloride (OCEAN) 0.65 % SOLN nasal spray Place 2 sprays into both nostrils as needed for congestion.    Marland Kitchen Spacer/Aero-Holding Chambers (AEROCHAMBER MV) inhaler Use as instructed 1 each 0  . traMADol (ULTRAM) 50 MG tablet Take 50 mg by mouth every 8 (eight) hours as needed for moderate pain or severe pain.    . valsartan-hydrochlorothiazide (DIOVAN-HCT) 320-25 MG per tablet TAKE ONE (1) TABLET EACH DAY 30 tablet 6  . zolpidem (AMBIEN) 10 MG tablet Take 10 mg by mouth at bedtime as needed.       No facility-administered medications prior to visit.    Review of Systems;  Patient denies headache, fevers, malaise, unintentional weight loss, skin rash, eye pain, sinus congestion and  sinus pain, sore throat, dysphagia,  hemoptysis , cough, dyspnea, wheezing, chest pain, palpitations, orthopnea, edema, abdominal pain, nausea, melena, diarrhea, constipation, flank pain, dysuria, hematuria, urinary  Frequency, nocturia, numbness, tingling, seizures,  Focal weakness, Loss of consciousness,  Tremor, insomnia, depression, anxiety, and suicidal ideation.      Objective:  BP 140/78 mmHg  Pulse 77  Temp(Src) 98.4 F (36.9 C) (Oral)  Resp 14  Ht 5\' 2"  (1.575 m)  Wt 195 lb 8 oz (88.678 kg)  BMI 35.75 kg/m2  SpO2 94%  BP Readings from Last 3 Encounters:  05/13/15 140/78  05/12/15 130/60  04/02/15 169/56    Wt Readings from Last 3 Encounters:  05/13/15 195 lb 8 oz (88.678 kg)  05/12/15 196 lb (88.905 kg)  04/02/15 194 lb (87.998 kg)    General appearance: alert, cooperative and appears stated age Ears: normal TM's and external ear canals both ears Throat: lips, mucosa, and tongue normal; teeth and gums normal Neck: no adenopathy, no carotid bruit, supple, symmetrical, trachea midline and thyroid not enlarged, symmetric, no tenderness/mass/nodules Back: symmetric, no curvature. ROM normal. No CVA tenderness. Lungs: clear to auscultation bilaterally Heart: regular rate and rhythm, S1, S2 normal, no murmur, click, rub or gallop Abdomen: soft, non-tender; bowel sounds normal; no masses,  no organomegaly Pulses: 2+ and symmetric Skin: Skin color, texture, turgor normal. No rashes or lesions Lymph nodes: Cervical, supraclavicular, and axillary nodes normal.  Lab Results  Component Value Date   HGBA1C 8.9* 01/01/2014   HGBA1C 9.1* 07/30/2011    Lab Results  Component Value Date   CREATININE 1.43* 05/13/2015   CREATININE 1.6* 05/05/2015   CREATININE 1.36* 11/25/2014    Lab Results  Component Value Date   WBC 7.1 03/27/2015   HGB 11.8* 03/27/2015   HCT 36.4 03/27/2015   PLT 198 03/27/2015   GLUCOSE 275* 05/13/2015   CHOL 181 05/13/2015   TRIG 110.0  05/13/2015   HDL 66.90 05/13/2015   LDLDIRECT 51.9 09/07/2012   LDLCALC 92 05/13/2015   ALT 23 11/18/2014   AST 30 11/18/2014   NA 136 05/13/2015   K 4.5 05/13/2015   CL 103 05/13/2015   CREATININE 1.43* 05/13/2015   BUN 24* 05/13/2015   CO2 27 05/13/2015   TSH 1.35 02/25/2014   INR 1.01 03/27/2015   HGBA1C 8.9* 01/01/2014    No results found.  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  Referral to nephrology for evaluation       Relevant Orders  Basic metabolic panel (Completed)   Ambulatory referral to Nephrology    Other Visit Diagnoses    Dysuria    -  Primary    Hyperlipidemia        Relevant Orders    Lipid panel (Completed)    Diabetic nephropathy associated with type 2 diabetes mellitus        Relevant Orders    Ambulatory referral to Nephrology       I am having Ms. Beltran maintain her calcium carbonate, DAILY MULTIPLE VITAMINS PO, insulin lispro, fluticasone, mometasone, zolpidem, insulin NPH Human, methocarbamol, aspirin, nitroGLYCERIN, metFORMIN, benzonatate, sodium chloride, cyclobenzaprine, traMADol, albuterol, Turmeric Curcumin, AEROCHAMBER MV, omeprazole, guaiFENesin-codeine, valsartan-hydrochlorothiazide, doxazosin, and budesonide-formoterol.  No orders of the defined types were placed in this encounter.    There are no discontinued medications.  Follow-up: No Follow-up on file.   Crecencio Mc, MD

## 2015-05-13 NOTE — Assessment & Plan Note (Signed)
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  Referral to nephrology for evaluation

## 2015-05-13 NOTE — Patient Instructions (Signed)
Referral to nephrology is under way as requested

## 2015-05-15 ENCOUNTER — Encounter: Payer: Self-pay | Admitting: Internal Medicine

## 2015-05-15 LAB — AFB CULTURE WITH SMEAR (NOT AT ARMC)
Acid Fast Smear: NONE SEEN
SPECIAL REQUESTS: NORMAL

## 2015-06-02 ENCOUNTER — Other Ambulatory Visit: Payer: Self-pay | Admitting: Internal Medicine

## 2015-06-06 NOTE — Telephone Encounter (Signed)
Sent unread mychart message to patient via mail

## 2015-06-24 ENCOUNTER — Ambulatory Visit: Payer: Medicare Other

## 2015-06-24 DIAGNOSIS — I10 Essential (primary) hypertension: Secondary | ICD-10-CM | POA: Diagnosis not present

## 2015-06-24 DIAGNOSIS — R319 Hematuria, unspecified: Secondary | ICD-10-CM | POA: Diagnosis not present

## 2015-06-24 DIAGNOSIS — E1122 Type 2 diabetes mellitus with diabetic chronic kidney disease: Secondary | ICD-10-CM | POA: Diagnosis not present

## 2015-06-24 DIAGNOSIS — R809 Proteinuria, unspecified: Secondary | ICD-10-CM | POA: Diagnosis not present

## 2015-06-24 DIAGNOSIS — N183 Chronic kidney disease, stage 3 (moderate): Secondary | ICD-10-CM | POA: Diagnosis not present

## 2015-06-26 ENCOUNTER — Other Ambulatory Visit: Payer: Self-pay | Admitting: Nephrology

## 2015-06-26 DIAGNOSIS — N183 Chronic kidney disease, stage 3 unspecified: Secondary | ICD-10-CM

## 2015-06-30 ENCOUNTER — Ambulatory Visit
Admission: RE | Admit: 2015-06-30 | Discharge: 2015-06-30 | Disposition: A | Discharge status: Home / Self Care | Payer: Medicare Other | Source: Ambulatory Visit | Attending: Nephrology | Admitting: Nephrology

## 2015-06-30 DIAGNOSIS — N183 Chronic kidney disease, stage 3 unspecified: Secondary | ICD-10-CM

## 2015-06-30 DIAGNOSIS — Z905 Acquired absence of kidney: Secondary | ICD-10-CM | POA: Diagnosis not present

## 2015-06-30 DIAGNOSIS — N189 Chronic kidney disease, unspecified: Secondary | ICD-10-CM | POA: Diagnosis not present

## 2015-06-30 DIAGNOSIS — R319 Hematuria, unspecified: Secondary | ICD-10-CM | POA: Insufficient documentation

## 2015-06-30 DIAGNOSIS — E119 Type 2 diabetes mellitus without complications: Secondary | ICD-10-CM | POA: Diagnosis not present

## 2015-06-30 DIAGNOSIS — Z90722 Acquired absence of ovaries, bilateral: Secondary | ICD-10-CM | POA: Diagnosis not present

## 2015-07-11 DIAGNOSIS — R809 Proteinuria, unspecified: Secondary | ICD-10-CM | POA: Diagnosis not present

## 2015-07-11 DIAGNOSIS — N183 Chronic kidney disease, stage 3 (moderate): Secondary | ICD-10-CM | POA: Diagnosis not present

## 2015-07-11 DIAGNOSIS — E1122 Type 2 diabetes mellitus with diabetic chronic kidney disease: Secondary | ICD-10-CM | POA: Diagnosis not present

## 2015-07-11 DIAGNOSIS — I10 Essential (primary) hypertension: Secondary | ICD-10-CM | POA: Diagnosis not present

## 2015-07-11 DIAGNOSIS — R319 Hematuria, unspecified: Secondary | ICD-10-CM | POA: Diagnosis not present

## 2015-07-19 ENCOUNTER — Emergency Department: Payer: Medicare Other

## 2015-07-19 ENCOUNTER — Encounter: Payer: Self-pay | Admitting: *Deleted

## 2015-07-19 ENCOUNTER — Emergency Department
Admission: EM | Admit: 2015-07-19 | Discharge: 2015-07-19 | Disposition: A | Discharge status: Home / Self Care | Payer: Medicare Other | Attending: Emergency Medicine | Admitting: Emergency Medicine

## 2015-07-19 DIAGNOSIS — N183 Chronic kidney disease, stage 3 (moderate): Secondary | ICD-10-CM | POA: Diagnosis not present

## 2015-07-19 DIAGNOSIS — I129 Hypertensive chronic kidney disease with stage 1 through stage 4 chronic kidney disease, or unspecified chronic kidney disease: Secondary | ICD-10-CM | POA: Insufficient documentation

## 2015-07-19 DIAGNOSIS — R140 Abdominal distension (gaseous): Secondary | ICD-10-CM | POA: Insufficient documentation

## 2015-07-19 DIAGNOSIS — K5900 Constipation, unspecified: Secondary | ICD-10-CM

## 2015-07-19 DIAGNOSIS — Z880 Allergy status to penicillin: Secondary | ICD-10-CM | POA: Insufficient documentation

## 2015-07-19 DIAGNOSIS — Z720 Tobacco use: Secondary | ICD-10-CM | POA: Insufficient documentation

## 2015-07-19 DIAGNOSIS — R1010 Upper abdominal pain, unspecified: Secondary | ICD-10-CM | POA: Diagnosis present

## 2015-07-19 DIAGNOSIS — R079 Chest pain, unspecified: Secondary | ICD-10-CM | POA: Diagnosis not present

## 2015-07-19 DIAGNOSIS — R1084 Generalized abdominal pain: Secondary | ICD-10-CM | POA: Diagnosis not present

## 2015-07-19 DIAGNOSIS — R109 Unspecified abdominal pain: Secondary | ICD-10-CM | POA: Diagnosis not present

## 2015-07-19 DIAGNOSIS — E119 Type 2 diabetes mellitus without complications: Secondary | ICD-10-CM | POA: Diagnosis not present

## 2015-07-19 DIAGNOSIS — R0602 Shortness of breath: Secondary | ICD-10-CM | POA: Diagnosis not present

## 2015-07-19 LAB — COMPREHENSIVE METABOLIC PANEL
ALT: 19 U/L (ref 14–54)
ANION GAP: 7 (ref 5–15)
AST: 30 U/L (ref 15–41)
Albumin: 3.3 g/dL — ABNORMAL LOW (ref 3.5–5.0)
Alkaline Phosphatase: 36 U/L — ABNORMAL LOW (ref 38–126)
BILIRUBIN TOTAL: 0.5 mg/dL (ref 0.3–1.2)
BUN: 21 mg/dL — ABNORMAL HIGH (ref 6–20)
CO2: 27 mmol/L (ref 22–32)
Calcium: 9.6 mg/dL (ref 8.9–10.3)
Chloride: 103 mmol/L (ref 101–111)
Creatinine, Ser: 1.17 mg/dL — ABNORMAL HIGH (ref 0.44–1.00)
GFR calc Af Amer: 53 mL/min — ABNORMAL LOW (ref >60)
GFR, EST NON AFRICAN AMERICAN: 46 mL/min — AB (ref >60)
Glucose, Bld: 195 mg/dL — ABNORMAL HIGH (ref 65–99)
POTASSIUM: 4.2 mmol/L (ref 3.5–5.1)
Sodium: 137 mmol/L (ref 135–145)
TOTAL PROTEIN: 6.9 g/dL (ref 6.5–8.1)

## 2015-07-19 LAB — CBC WITH DIFFERENTIAL/PLATELET
BASOS PCT: 0 %
Basophils Absolute: 0 10*3/uL (ref 0–0.1)
Eosinophils Absolute: 0.5 10*3/uL (ref 0–0.7)
Eosinophils Relative: 7 %
HEMATOCRIT: 36.8 % (ref 35.0–47.0)
Hemoglobin: 12.2 g/dL (ref 12.0–16.0)
LYMPHS PCT: 31 %
Lymphs Abs: 2.1 10*3/uL (ref 1.0–3.6)
MCH: 29.6 pg (ref 26.0–34.0)
MCHC: 33.1 g/dL (ref 32.0–36.0)
MCV: 89.4 fL (ref 80.0–100.0)
MONO ABS: 0.5 10*3/uL (ref 0.2–0.9)
MONOS PCT: 8 %
NEUTROS ABS: 3.5 10*3/uL (ref 1.4–6.5)
Neutrophils Relative %: 54 %
Platelets: 197 10*3/uL (ref 150–440)
RBC: 4.12 MIL/uL (ref 3.80–5.20)
RDW: 15 % — AB (ref 11.5–14.5)
WBC: 6.6 10*3/uL (ref 3.6–11.0)

## 2015-07-19 LAB — TROPONIN I: Troponin I: <0.03 ng/mL (ref <0.031)

## 2015-07-19 LAB — LIPASE, BLOOD: LIPASE: 23 U/L (ref 22–51)

## 2015-07-19 MED ORDER — ONDANSETRON HCL 4 MG/2ML IJ SOLN
4.0000 mg | Freq: Once | INTRAMUSCULAR | Status: AC
  Administered 2015-07-19: 4 mg via INTRAVENOUS
  Filled 2015-07-19: qty 2

## 2015-07-19 MED ORDER — HYDROMORPHONE HCL 1 MG/ML IJ SOLN
1.0000 mg | Freq: Once | INTRAMUSCULAR | Status: AC
  Administered 2015-07-19: 1 mg via INTRAVENOUS
  Filled 2015-07-19: qty 1

## 2015-07-19 MED ORDER — BISACODYL 10 MG RE SUPP: 10.0000 mg | RECTAL | Status: DC | PRN

## 2015-07-19 NOTE — ED Provider Notes (Signed)
[facility] Emergency Department Provider Note     Time seen: ----------------------------------------- 6:44 PM on 07/19/2015 -----------------------------------------    I have reviewed the triage vital signs and the nursing notes.   HISTORY  Chief Complaint No chief complaint on file.    HPI Andrea Stewart is a 69 y.o. female who presents ER for upper abdominal pain and abdominal tightness and distention. Patient states she's taken MiraLAX but has not had a good bowel movement in days. Patient states she is in the third stage of renal failure, her nephrologist just placed her on some vitamin D. She denies any fevers or chills, has had some lower chest pain in conjunction with the abdominal pain. Denies any other complaints.   Past Medical History  Diagnosis Date  . Myalgia and myositis, unspecified   . Plantar fascial fibromatosis   . Personal history of tobacco use, presenting hazards to health     1/2 ppd  . Degeneration of intervertebral disc, site unspecified   . Backache, unspecified     s/p c-spine and l-spine fusions  . Obesity, unspecified   . Disorder of bone and cartilage, unspecified   . Unspecified hereditary and idiopathic peripheral neuropathy     secondary to diabetes  . Unspecified essential hypertension   . Type II or unspecified type diabetes mellitus without mention of complication, not stated as uncontrolled   . Depressive disorder, not elsewhere classified   . GERD (gastroesophageal reflux disease)   . S/p nephrectomy   . S/P cholecystectomy   . Hx of left heart catheterization by cutdown     a. 7 years ago; no significant cad; b. Lexiscan 2014: no significant ischemia, no significant EKG changes concerning for ischemia, EF 67%, overall low risk study  . HTN (hypertension)     Patient Active Problem List   Diagnosis Date Noted  . CKD (chronic kidney disease), stage III 05/13/2015  . COPD (chronic obstructive  pulmonary disease) with chronic bronchitis 01/21/2015  . Abnormal CT scan, chest 01/21/2015  . Cough 11/12/2014  . HTN (hypertension)   . Hx of left heart catheterization by cutdown   . Single kidney 09/30/2014  . History of smoking 09/30/2014  . Encounter for preventive health examination 03/03/2014  . History of anemia 03/03/2014  . Right leg pain 01/28/2014  . Irritable bowel syndrome 09/09/2012  . Chest pain 08/25/2009  . Coronary atherosclerosis 07/26/2009  . Diabetes mellitus type 2 with complications, uncontrolled 03/09/2007  . Morbid obesity 03/09/2007  . DEPRESSION 03/09/2007  . PERIPHERAL NEUROPATHY 03/09/2007  . Essential hypertension 03/09/2007  . DEGENERATIVE DISC DISEASE 03/09/2007  . BACK PAIN, CHRONIC 03/09/2007  . PLANTAR FASCIITIS 03/09/2007  . FIBROMYALGIA 03/09/2007  . Disorder of bone and cartilage 03/09/2007    Past Surgical History  Procedure Laterality Date  . Appendectomy    . Carpal tunnel release    . Cholecystectomy    . Vesicovaginal fistula closure w/ tah    . Replacement total knee  10/2004  . Foot surgery      x 3  . Elbow surgery    . Trigger finger surgery    . Kidney donation  72  . Cervical discectomy    . Lumbar fusion    . Colonoscopy  09/1999    colonoscopy-hemorrhoids, re-check 5 years (10/2006)  . Cardiac catheterization      o.k. 2003  . Dexa      osteopenia (01/2004)  . Neck surgery  06/2005  . Back surgery      2006  . Problem with general anesthesia      02/2006  . Abdominal hysterectomy    . Video bronchoscopy Bilateral 04/02/2015    Procedure: VIDEO BRONCHOSCOPY WITHOUT FLUORO;  Surgeon: Juanito Doom, MD;  Location: [place];  Service: Cardiopulmonary;  Laterality: Bilateral;    Allergies Morphine; Prednisone; Gabapentin; Nsaids; and Penicillins  Social History Social History  Substance Use Topics  . Smoking status: Current Every Day Smoker -- 0.50 packs/day for 23 years    Types: Cigarettes   . Smokeless tobacco: Never Used     Comment: QUIT 08/31/2011 x 2 years and started back d/t stress in November 2015.   Marland Kitchen Alcohol Use: No    Review of Systems Constitutional: Negative for fever. Eyes: Negative for visual changes. ENT: Negative for sore throat. Cardiovascular: Positive for chest pain. Respiratory: Negative for shortness of breath. Gastrointestinal: Positive for abdominal pain and constipation Genitourinary: Negative for dysuria. Musculoskeletal: Negative for back pain. Skin: Negative for rash. Neurological: Negative for headaches, focal weakness or numbness.  10-point ROS otherwise negative.  ____________________________________________   PHYSICAL EXAM:  VITAL SIGNS: ED Triage Vitals  Enc Vitals Group     BP --      Pulse --      Resp --      Temp --      Temp src --      SpO2 --      Weight --      Height --      Head Cir --      Peak Flow --      Pain Score --      Pain Loc --      Pain Edu? --      Excl. in [place]? --     Constitutional: Alert and oriented. Anxious, mild distress Eyes: Conjunctivae are normal. PERRL. Normal extraocular movements. ENT   Head: Normocephalic and atraumatic.   Nose: No congestion/rhinnorhea.   Mouth/Throat: Mucous membranes are moist.   Neck: No stridor. Cardiovascular: Normal rate, regular rhythm. Normal and symmetric distal pulses are present in all extremities. No murmurs, rubs, or gallops. Respiratory: Normal respiratory effort without tachypnea nor retractions. Breath sounds are clear and equal bilaterally. No wheezes/rales/rhonchi. Gastrointestinal: Soft with abdominal distention, normal bowel sounds. Diffusely mildly tender. Musculoskeletal: Nontender with normal range of motion in all extremities. No joint effusions.  No lower extremity tenderness nor edema. Neurologic:  Normal speech and language. No gross focal neurologic deficits are appreciated. Speech is normal. No gait instability. Skin:   Skin is warm, dry and intact. No rash noted. Psychiatric: Mood and affect are normal. Speech and behavior are normal. Patient exhibits appropriate insight and judgment. ____________________________________________  EKG: Interpreted by me. Normal sinus rhythm with a rate of 92 bpm, normal PR interval, normal QRS with, normal QT interval. Normal axis. Grossly unremarkable EKG.  ____________________________________________  ED COURSE:  Pertinent labs & imaging results that were available during my care of the patient were reviewed by me and considered in my medical decision making (see chart for details). Patient likely is just constipated. We'll check labs and acute abdominal series. ____________________________________________    LABS (pertinent positives/negatives)  Labs Reviewed  CBC WITH DIFFERENTIAL/PLATELET - Abnormal; Notable for the following:    RDW 15.0 (*)    All other components within normal limits  COMPREHENSIVE METABOLIC PANEL - Abnormal; Notable for the following:    Glucose, Bld 195 (*)  BUN 21 (*)    Creatinine, Ser 1.17 (*)    Albumin 3.3 (*)    Alkaline Phosphatase 36 (*)    GFR calc non Af Amer 46 (*)    GFR calc Af Amer 53 (*)    All other components within normal limits  LIPASE, BLOOD  TROPONIN I  URINALYSIS COMPLETEWITH MICROSCOPIC ([facility] ONLY)    RADIOLOGY Images were viewed by me  Acute abdominal series IMPRESSION: 1. Unremarkable bowel gas pattern; no free intra-abdominal air seen. 2. Mild vascular congestion and mild cardiomegaly, with mildly increased interstitial markings, raising question for minimal interstitial edema.  ____________________________________________  FINAL ASSESSMENT AND PLAN  Abdominal pain, constipation  Plan: Patient with labs and imaging as dictated above. Patient's labs look unremarkable. She is in no acute distress. She will receive an enema to see if this improves her symptoms.  Patient has refused enema  here, is requesting suppositories to take at home. We'll prescribe Dulcolax to take at home. She is currently stable for outpatient follow-up. Earleen Newport, MD   Earleen Newport, MD 07/19/15 2033

## 2015-07-19 NOTE — Discharge Instructions (Signed)
Abdominal Pain °Many things can cause abdominal pain. Usually, abdominal pain is not caused by a disease and will improve without treatment. It can often be observed and treated at home. Your health care provider will do a physical exam and possibly order blood tests and X-rays to help determine the seriousness of your pain. However, in many cases, more time must pass before a clear cause of the pain can be found. Before that point, your health care provider may not know if you need more testing or further treatment. °HOME CARE INSTRUCTIONS  °Monitor your abdominal pain for any changes. The following actions may help to alleviate any discomfort you are experiencing: °· Only take over-the-counter or prescription medicines as directed by your health care provider. °· Do not take laxatives unless directed to do so by your health care provider. °· Try a clear liquid diet (broth, tea, or water) as directed by your health care provider. Slowly move to a bland diet as tolerated. °SEEK MEDICAL CARE IF: °· You have unexplained abdominal pain. °· You have abdominal pain associated with nausea or diarrhea. °· You have pain when you urinate or have a bowel movement. °· You experience abdominal pain that wakes you in the night. °· You have abdominal pain that is worsened or improved by eating food. °· You have abdominal pain that is worsened with eating fatty foods. °· You have a fever. °SEEK IMMEDIATE MEDICAL CARE IF:  °· Your pain does not go away within 2 hours. °· You keep throwing up (vomiting). °· Your pain is felt only in portions of the abdomen, such as the right side or the left lower portion of the abdomen. °· You pass bloody or black tarry stools. °MAKE SURE YOU: °· Understand these instructions.   °· Will watch your condition.   °· Will get help right away if you are not doing well or get worse.   °Document Released: 08/11/2005 Document Revised: 11/06/2013 Document Reviewed: 07/11/2013 °[employer]® Patient Information  ©2015 [employer]. This information is not intended to replace advice given to you by your health care provider. Make sure you discuss any questions you have with your health care provider. ° °Bloating °Bloating is the feeling of fullness in your belly. You may feel as though your pants are too tight. Often the cause of bloating is overeating, retaining fluids, or having gas in your bowel. It is also caused by swallowing air and eating foods that cause gas. Irritable bowel syndrome is one of the most common causes of bloating. Constipation is also a common cause. Sometimes more serious problems can cause bloating. °SYMPTOMS  °Usually there is a feeling of fullness, as though your abdomen is bulged out. There may be mild discomfort.  °DIAGNOSIS  °Usually no particular testing is necessary for most bloating. If the condition persists and seems to become worse, your caregiver may do additional testing.  °TREATMENT  °· There is no direct treatment for bloating. °· Do not put gas into the bowel. Avoid chewing gum and sucking on candy. These tend to make you swallow air. Swallowing air can also be a nervous habit. Try to avoid this. °· Avoiding high residue diets will help. Eat foods with soluble fibers (examples include root vegetables, apples, or barley) and substitute dairy products with soy and rice products. This helps irritable bowel syndrome. °· If constipation is the cause, then a high residue diet with more fiber will help. °· Avoid carbonated beverages. °· Over-the-counter preparations are available that help reduce gas. Your   pharmacist can help you with this. SEEK MEDICAL CARE IF:   Bloating continues and seems to be getting worse.  You notice a weight gain.  You have a weight loss but the bloating is getting worse.  You have changes in your bowel habits or develop nausea or vomiting. SEEK IMMEDIATE MEDICAL CARE IF:   You develop shortness of breath or swelling in your legs.  You have an  increase in abdominal pain or develop chest pain. Document Released: 09/01/2006 Document Revised: 01/24/2012 Document Reviewed: 10/20/2007 [facility] Patient Information 2015 [place], [place]. This information is not intended to replace advice given to you by your health care provider. Make sure you discuss any questions you have with your health care provider.  Constipation Constipation is when a person:  Poops (has a bowel movement) less than 3 times a week.  Has a hard time pooping.  Has poop that is dry, hard, or bigger than normal. HOME CARE   Eat foods with a lot of fiber in them. This includes fruits, vegetables, beans, and whole grains such as brown rice.  Avoid fatty foods and foods with a lot of sugar. This includes french fries, hamburgers, cookies, candy, and soda.  If you are not getting enough fiber from food, take products with added fiber in them (supplements).  Drink enough fluid to keep your pee (urine) clear or pale yellow.  Exercise on a regular basis, or as told by your doctor.  Go to the restroom when you feel like you need to poop. Do not hold it.  Only take medicine as told by your doctor. Do not take medicines that help you poop (laxatives) without talking to your doctor first. GET HELP RIGHT AWAY IF:   You have bright red blood in your poop (stool).  Your constipation lasts more than 4 days or gets worse.  You have belly (abdominal) or butt (rectal) pain.  You have thin poop (as thin as a pencil).  You lose weight, and it cannot be explained. MAKE SURE YOU:   Understand these instructions.  Will watch your condition.  Will get help right away if you are not doing well or get worse. Document Released: 04/19/2008 Document Revised: 11/06/2013 Document Reviewed: 08/13/2013 [facility] Patient Information 2015 [place], [place]. This information is not intended to replace advice given to you by your health care provider. Make sure you discuss any questions  you have with your health care provider.

## 2015-07-19 NOTE — ED Notes (Signed)
Patient transported to X-ray 

## 2015-07-19 NOTE — ED Notes (Signed)
Per courtney, rn. Pt declines enema. Pt states will take suppositories.

## 2015-07-19 NOTE — ED Notes (Signed)
Pt arrives with complaints of upper abd pain with abd tightness, pt states she has taken miralax but has not had a good BM, Dr. Jimmye Norman at bedside

## 2015-07-19 NOTE — ED Notes (Signed)
Pt declines offer for enema, states "i just want some suppositories and to go home." pt updated will notify md.

## 2015-08-04 DIAGNOSIS — R809 Proteinuria, unspecified: Secondary | ICD-10-CM | POA: Diagnosis not present

## 2015-08-04 DIAGNOSIS — N183 Chronic kidney disease, stage 3 (moderate): Secondary | ICD-10-CM | POA: Diagnosis not present

## 2015-08-04 DIAGNOSIS — E1122 Type 2 diabetes mellitus with diabetic chronic kidney disease: Secondary | ICD-10-CM | POA: Diagnosis not present

## 2015-08-04 DIAGNOSIS — I10 Essential (primary) hypertension: Secondary | ICD-10-CM | POA: Diagnosis not present

## 2015-08-07 DIAGNOSIS — I2510 Atherosclerotic heart disease of native coronary artery without angina pectoris: Secondary | ICD-10-CM | POA: Diagnosis not present

## 2015-08-07 DIAGNOSIS — Z9889 Other specified postprocedural states: Secondary | ICD-10-CM | POA: Insufficient documentation

## 2015-08-07 DIAGNOSIS — R938 Abnormal findings on diagnostic imaging of other specified body structures: Secondary | ICD-10-CM | POA: Diagnosis not present

## 2015-08-11 ENCOUNTER — Ambulatory Visit (INDEPENDENT_AMBULATORY_CARE_PROVIDER_SITE_OTHER)
Admission: RE | Admit: 2015-08-11 | Discharge: 2015-08-11 | Disposition: A | Discharge status: Home / Self Care | Payer: Medicare Other | Source: Ambulatory Visit | Attending: Pulmonary Disease | Admitting: Pulmonary Disease

## 2015-08-11 ENCOUNTER — Encounter: Payer: Self-pay | Admitting: Pulmonary Disease

## 2015-08-11 ENCOUNTER — Ambulatory Visit (INDEPENDENT_AMBULATORY_CARE_PROVIDER_SITE_OTHER): Payer: Medicare Other | Admitting: Pulmonary Disease

## 2015-08-11 VITALS — BP 136/82 | HR 81 | Ht 61.0 in | Wt 190.0 lb

## 2015-08-11 DIAGNOSIS — R0789 Other chest pain: Secondary | ICD-10-CM

## 2015-08-11 DIAGNOSIS — J449 Chronic obstructive pulmonary disease, unspecified: Secondary | ICD-10-CM

## 2015-08-11 DIAGNOSIS — Z23 Encounter for immunization: Secondary | ICD-10-CM

## 2015-08-11 DIAGNOSIS — R938 Abnormal findings on diagnostic imaging of other specified body structures: Secondary | ICD-10-CM

## 2015-08-11 DIAGNOSIS — R072 Precordial pain: Secondary | ICD-10-CM | POA: Diagnosis not present

## 2015-08-11 DIAGNOSIS — R9389 Abnormal findings on diagnostic imaging of other specified body structures: Secondary | ICD-10-CM

## 2015-08-11 NOTE — Assessment & Plan Note (Signed)
She has non specific findings on her CT chest, most of which improved or went away completely by April 2016.  I don't feel that these finds are acute or represent the cause of her dyspnea, primarily because they improved by the 02/2015 study.  COPD can cause chest tightness but this is non-specific finding.    Plan: Will try to treat the COPD more aggressively I think she needs to have the Endoscopy  Check a CXR today to ensure nothing else going on

## 2015-08-11 NOTE — Assessment & Plan Note (Signed)
The pulmonary nodule seen on the January 2016 CT chest resolved, the tree in bud abnormalities which were felt to be related possibly to chronic aspiration were stable. She also had a calcified thyroid nodule which is likely also chronic. Because of her nonspecific chest tightness I will check a chest x-ray to ensure there is no evidence of another process. The tree in bud abnormalities were evaluated with a bronchoscopy and there is no evidence of a chronic atypical infection.  Plan: Check a chest x-ray today Treat COPD more aggressively If no improvement after her endoscopy and consider a repeat CT chest, but I do not think this is necessary right now.

## 2015-08-11 NOTE — Assessment & Plan Note (Signed)
She has moderate COPD, as noted above this can sometimes cause chest tightness but I doubt that is the primary cause.  Plan: Trial of Spiriva in addition to Symbicort Flu shot today

## 2015-08-11 NOTE — Patient Instructions (Signed)
Stop smoking Try using Spiriva1 puff daily no matter how you feel  Call me and let me know if the [facility] is helping  We will order a CT scan of you chest and call you with the results We will see you back in 6 weeks or sooner if needed

## 2015-08-11 NOTE — Progress Notes (Signed)
Subjective:    Patient ID: Andrea Stewart, female    DOB: 12/29/1945, 69 y.o.   MRN: [id number]  Synopsis: Referred in March 2016 for evaluation of ongoing shortness of breath. She had a CT scan performed at [facility] in 2015 which demonstrated tree-in-bud abnormalities in the right upper lobe as well as a left lower lobe pulmonary nodule. A repeat CT scan was performed in January 2016 showing a right lower lobe pleural-based pulmonary nodule 1.27 m in size. This resolved on a April 2016 CT chest.  11/20/2014 CT chest> tree in bud, inflammatory appearing abnormalities in the right upper lobe, left upper lobe pleural-based 1.2 cm solid nodule 02/2015 CT chest > right upper lobe tree-in-bud abnormalities persistent, the pulmonary nodule seen on the previous study has resolved 01/2015 PFT> normal ratio but consistent with moderate obstruction (FEV1 1.16L (58% pred)), TLC 4.57L (99% pred), RV 133% pred, DLCO 27.6 (136% pred) 2016 Bronch> AFB bal negative  HPI Chief Complaint  Patient presents with  . Follow-up    3 month rov.  pt c/o DOE.  pt has not been able to start pulm rehab d/t several med changes by her nephrologist.      Andrea Stewart has been seeing the nephrology team in [place] and has ben having her blood pressure medicaitons and diabetes medicaitons changed.  She did not start pulmonary rehab.  She is supposed to have an endoscopy, but didn't have a date scheduled yet.  She continues to have some dyspnea on exertion and occassional chest tightness. She feels squeezing in her chest. The tightness is unpredictable, it can happen at rest or on exertion.  It doesn't really happen with eating or after meals.  She continues to take Symbicort daily.  Past Medical History  Diagnosis Date  . Myalgia and myositis, unspecified   . Plantar fascial fibromatosis   . Personal history of tobacco use, presenting hazards to health     1/2 ppd  . Degeneration of intervertebral disc, site unspecified     . Backache, unspecified     s/p c-spine and l-spine fusions  . Obesity, unspecified   . Disorder of bone and cartilage, unspecified   . Unspecified hereditary and idiopathic peripheral neuropathy     secondary to diabetes  . Unspecified essential hypertension   . Type II or unspecified type diabetes mellitus without mention of complication, not stated as uncontrolled   . Depressive disorder, not elsewhere classified   . GERD (gastroesophageal reflux disease)   . S/p nephrectomy   . S/P cholecystectomy   . Hx of left heart catheterization by cutdown     a. 7 years ago; no significant cad; b. Lexiscan 2014: no significant ischemia, no significant EKG changes concerning for ischemia, EF 67%, overall low risk study  . HTN (hypertension)       Review of Systems  Constitutional: Negative for fever, chills and fatigue.  HENT: Negative for postnasal drip, rhinorrhea and sinus pressure.   Respiratory: Positive for shortness of breath. Negative for cough and wheezing.   Cardiovascular: Positive for chest pain. Negative for palpitations and leg swelling.       Objective:   Physical Exam Filed Vitals:   08/11/15 1318  BP: 136/82  Pulse: 81  Height: 5\' 1"  (1.549 m)  Weight: 190 lb (86.183 kg)  SpO2: 94%   RA  Gen: no acute distress HEENT: NCAT, EOMi, OP clear PULM: CTA B  CV: RRR, no mgr, no JVD AB: BS+, soft, nontender,  Ext: warm, no edema, no clubbing, no cyanosis Derm: no rash or skin breakdown Neuro: A&Ox4, MAEW  Records from her nephrologist were reviewed where she has been found to have significant proteinuria April 2016 CT chest report reviewed where it showed resolution of the pulmonary nodule, stable tree-in-bud abnormalities and a stable calcified nodule in the thyroid gland      Assessment & Plan:   Chest pain She has non specific findings on her CT chest, most of which improved or went away completely by April 2016.  I don't feel that these finds are acute or  represent the cause of her dyspnea, primarily because they improved by the 02/2015 study.  COPD can cause chest tightness but this is non-specific finding.    Plan: Will try to treat the COPD more aggressively I think she needs to have the Endoscopy  Check a CXR today to ensure nothing else going on  COPD (chronic obstructive pulmonary disease) with chronic bronchitis She has moderate COPD, as noted above this can sometimes cause chest tightness but I doubt that is the primary cause.  Plan: Trial of Spiriva in addition to Symbicort Flu shot today  Abnormal CT scan, chest The pulmonary nodule seen on the January 2016 CT chest resolved, the tree in bud abnormalities which were felt to be related possibly to chronic aspiration were stable. She also had a calcified thyroid nodule which is likely also chronic. Because of her nonspecific chest tightness I will check a chest x-ray to ensure there is no evidence of another process. The tree in bud abnormalities were evaluated with a bronchoscopy and there is no evidence of a chronic atypical infection.  Plan: Check a chest x-ray today Treat COPD more aggressively If no improvement after her endoscopy and consider a repeat CT chest, but I do not think this is necessary right now.    Updated Medication List Outpatient Encounter Prescriptions as of 08/11/2015  Medication Sig  . albuterol (PROVENTIL HFA) 108 (90 BASE) MCG/ACT inhaler Inhale 2 puffs into the lungs every 4 (four) hours as needed for wheezing or shortness of breath.  Marland Kitchen aspirin 81 MG tablet Take 1 tablet (81 mg total) by mouth daily.  . benzonatate (TESSALON) 200 MG capsule Take 1 capsule (200 mg total) by mouth 3 (three) times daily as needed for cough.  . bisacodyl (DULCOLAX) 10 MG suppository Place 1 suppository (10 mg total) rectally as needed for moderate constipation.  . budesonide-formoterol (SYMBICORT) 160-4.5 MCG/ACT inhaler Inhale 2 puffs into the lungs 2 (two) times daily.    . calcium carbonate (OS-CAL) 600 MG TABS Take 600 mg by mouth daily.    . cyclobenzaprine (FLEXERIL) 10 MG tablet   . DAILY MULTIPLE VITAMINS PO Take 1 tablet by mouth daily.    Marland Kitchen doxazosin (CARDURA) 2 MG tablet TAKE ONE-HALF (1/2) TABLET EVERY DAY  . fluticasone (VERAMYST) 27.5 MCG/SPRAY nasal spray Place 2 sprays into the nose as needed for rhinitis or allergies.   Marland Kitchen guaiFENesin-codeine (CHERATUSSIN AC) 100-10 MG/5ML syrup Take 5 mLs by mouth at bedtime.  . insulin lispro (HUMALOG) 100 UNIT/ML injection Inject into the skin as directed. Sliding scale 100-150 = 2-2.5 units 150-200 = 3-3.5 units 200-250 = 4-4.5 units 251-300 = 5-5.5 units 301-350 = 6-6.5 units 350-400 = 7-7.5 units  . insulin NPH (HUMULIN N,NOVOLIN N) 100 UNIT/ML injection Inject 20 Units into the skin daily before breakfast. 22 units at bedtime  . metFORMIN (GLUCOPHAGE-XR) 500 MG 24 hr tablet Take 500 mg  by mouth 2 (two) times daily.   . methocarbamol (ROBAXIN) 500 MG tablet Take 500 mg by mouth every 6 (six) hours as needed for muscle spasms.   . Misc Natural Products (TURMERIC CURCUMIN) CAPS 900mg  capsule daily  . mometasone (NASONEX) 50 MCG/ACT nasal spray Place 2 sprays into the nose daily.    . nitroGLYCERIN (NITROSTAT) 0.4 MG SL tablet Place 1 tablet (0.4 mg total) under the tongue every 5 (five) minutes as needed. Maximum 3 tablets (Patient taking differently: Place 0.4 mg under the tongue every 5 (five) minutes as needed for chest pain. Maximum 3 tablets)  . sodium chloride (OCEAN) 0.65 % SOLN nasal spray Place 2 sprays into both nostrils as needed for congestion.  Marland Kitchen Spacer/Aero-Holding Chambers (AEROCHAMBER MV) inhaler Use as instructed  . traMADol (ULTRAM) 50 MG tablet Take 50 mg by mouth every 8 (eight) hours as needed for moderate pain or severe pain.  . valsartan-hydrochlorothiazide (DIOVAN-HCT) 320-25 MG per tablet TAKE ONE (1) TABLET EACH DAY  . zolpidem (AMBIEN) 10 MG tablet Take 10 mg by mouth at bedtime as  needed.    . [DISCONTINUED] omeprazole (PRILOSEC) 40 MG capsule TAKE ONE CAPSULE TWICE A DAY (Patient not taking: Reported on 08/11/2015)   No facility-administered encounter medications on file as of 08/11/2015.

## 2015-08-19 ENCOUNTER — Telehealth: Payer: Self-pay | Admitting: Pulmonary Disease

## 2015-08-19 ENCOUNTER — Ambulatory Visit: Payer: Medicare Other

## 2015-08-19 DIAGNOSIS — M5116 Intervertebral disc disorders with radiculopathy, lumbar region: Secondary | ICD-10-CM | POA: Diagnosis not present

## 2015-08-19 DIAGNOSIS — M47812 Spondylosis without myelopathy or radiculopathy, cervical region: Secondary | ICD-10-CM | POA: Diagnosis not present

## 2015-08-19 NOTE — Telephone Encounter (Signed)
?   Is this for the labs that were ordered that day or the bronchoscopy ordered that day?  Several things were ordered, and dx codes are required for all of these orders to be processed.Andrea Stewart

## 2015-08-19 NOTE — Telephone Encounter (Signed)
Marda Stalker states that patient's orders from 03/27/15 need written diagnosis and needs to be electronically signed. Needs to be faxed to: [phone] Attn: Dorian Pod notified that Dr. Lake Bells is out of the office until Monday.   She requests that he take care of this on Monday when he returns because they are trying to get the older orders closed out.  To [facility] and Dr. Lake Bells

## 2015-08-19 NOTE — Telephone Encounter (Signed)
ATC Andrea Stewart to find out who she is and why labs from 4 months ago are needed to be printed and faxed, as the CB # is a chmg # and all reports should be accessible through Epic.  No answer, no vm.  Wcb.   Also need to figure out exactly what orders are needed, as several were ordered on this visit.

## 2015-08-20 NOTE — Telephone Encounter (Signed)
ATC # and did not receive anyone. WCB and no VM

## 2015-08-21 NOTE — Telephone Encounter (Signed)
atc X3, no answer, no vm. Will close per triage protocol.

## 2015-08-25 NOTE — Telephone Encounter (Signed)
Let me know if anything needs to be done here

## 2015-08-29 DIAGNOSIS — N183 Chronic kidney disease, stage 3 (moderate): Secondary | ICD-10-CM | POA: Diagnosis not present

## 2015-08-29 DIAGNOSIS — I10 Essential (primary) hypertension: Secondary | ICD-10-CM | POA: Diagnosis not present

## 2015-08-29 DIAGNOSIS — R809 Proteinuria, unspecified: Secondary | ICD-10-CM | POA: Diagnosis not present

## 2015-08-29 DIAGNOSIS — E1122 Type 2 diabetes mellitus with diabetic chronic kidney disease: Secondary | ICD-10-CM | POA: Diagnosis not present

## 2015-08-29 DIAGNOSIS — R319 Hematuria, unspecified: Secondary | ICD-10-CM | POA: Diagnosis not present

## 2015-09-25 ENCOUNTER — Ambulatory Visit: Payer: Medicare Other | Admitting: Pulmonary Disease

## 2015-09-29 ENCOUNTER — Telehealth: Payer: Self-pay | Admitting: Pulmonary Disease

## 2015-09-29 NOTE — Telephone Encounter (Signed)
Leave on my desk with instructions

## 2015-09-29 NOTE — Telephone Encounter (Signed)
From previous telephone encounter which closed d/t no response, see below.  Andrea Stewart is needing orders which she is faxing to our office to be signed and faxed back ASAP. [place] fax number verified as [phone] Call back [phone] Glean Hess, [place] at 08/19/2015 11:56 AM     Status: Signed       Expand All Collapse All   Andrea Stewart states that patient's orders from 03/27/15 need written diagnosis and needs to be electronically signed. Needs to be faxed to: [phone] Attn: Dorian Pod notified that Dr. Lake Bells is out of the office until Monday.  She requests that he take care of this on Monday when he returns because they are trying to get the older orders closed out.  To Caryl Pina and Dr. Lake Bells       Will send to [facility] to follow up and ensure this gets received and signed by BQ

## 2015-10-01 NOTE — Telephone Encounter (Signed)
This has been received, placed in BQ's look-at folder (which is now on his desk).  Awaiting BQ's signature to fax this back.

## 2015-10-01 NOTE — Telephone Encounter (Signed)
Ash, please advise if this was done, thanks

## 2015-10-06 NOTE — Telephone Encounter (Signed)
BQ not back in the office until tomorrow. Called made Lucy aware.  Will forward to [place] to f/u tomorrow.

## 2015-10-06 NOTE — Telephone Encounter (Addendum)
Document will be signed and taken care of when BQ returns to office.

## 2015-10-06 NOTE — Telephone Encounter (Signed)
Calling to check on status of forms.Andrea Stewart

## 2015-10-07 NOTE — Telephone Encounter (Signed)
Order has been signed by BQ and faxed back,, confirmation fax received.  Nothing further needed.

## 2015-10-15 ENCOUNTER — Encounter: Payer: Self-pay | Admitting: Family Medicine

## 2015-10-15 ENCOUNTER — Ambulatory Visit (INDEPENDENT_AMBULATORY_CARE_PROVIDER_SITE_OTHER): Payer: Medicare Other | Admitting: Family Medicine

## 2015-10-15 DIAGNOSIS — J441 Chronic obstructive pulmonary disease with (acute) exacerbation: Secondary | ICD-10-CM | POA: Diagnosis not present

## 2015-10-15 MED ORDER — METHYLPREDNISOLONE ACETATE 40 MG/ML IJ SUSP: 40.0000 mg | Freq: Once | INTRAMUSCULAR | Status: DC

## 2015-10-15 MED ORDER — METHYLPREDNISOLONE ACETATE 40 MG/ML IJ SUSP
40.0000 mg | Freq: Once | INTRAMUSCULAR | Status: AC
  Administered 2015-10-15: 40 mg via INTRAMUSCULAR

## 2015-10-15 MED ORDER — DOXYCYCLINE HYCLATE 100 MG PO TABS: 100.0000 mg | ORAL_TABLET | Freq: Two times a day (BID) | ORAL | Status: DC

## 2015-10-15 NOTE — Patient Instructions (Signed)
Take the antibiotic as prescribed.  Continue your regular medications.  Please let us know if you fail to improve or worsen.  Take care  Dr. Lacinda Axon

## 2015-10-15 NOTE — Progress Notes (Signed)
Pre visit review using our clinic review tool, if applicable. No additional management support is needed unless otherwise documented below in the visit note. 

## 2015-10-15 NOTE — Assessment & Plan Note (Addendum)
History exam consistent with acute COPD exacerbation. Patient given IM Depo-Medrol 40 mg today. Treating with doxycycline. Advised continuation of home Symbicort. Patient to follow-up if she fails to improve or worsens.

## 2015-10-15 NOTE — Addendum Note (Signed)
Addended by: Carmin Muskrat on: 10/15/2015 01:33 PM   Modules accepted: Orders

## 2015-10-15 NOTE — Progress Notes (Signed)
Subjective:  Patient ID: Andrea Stewart, female    DOB: 1946/03/22  Age: 69 y.o. MRN: [id number]  CC: Chest congestion/cough  HPI:  69 year old female with a complicated past medical history including COPD, CKD, morbid obesity, hypertension, DM 2, and CAD presents to clinic today with complaints of cough and chest congestion.  Patient reports that yesterday she developed productive cough. She states that her cough is productive of yellow sputum. She also reports increasing shortness of breath. No reported fevers or chills. She has taken Tylenol and Mucinex with no improvement. Her shortness of breath is exacerbated by her cough. She has had a sick contact as her granddaughter has had a cough.   Social Hx   Social History   Social History  . Marital Status: Married    Spouse Name: N/A  . Number of Children: 4  . Years of Education: N/A   Occupational History  . Retired    Social History Main Topics  . Smoking status: Current Every Day Smoker -- 0.50 packs/day for 23 years    Types: Cigarettes  . Smokeless tobacco: Never Used     Comment: currently smoking .25 ppd, trying to quit 08/11/15  . Alcohol Use: No  . Drug Use: No  . Sexual Activity: Not Asked   Other Topics Concern  . None   Social History Narrative   Lives in [place]. Disability because of back    Review of Systems  Constitutional: Negative for fever and diaphoresis.  HENT: Positive for congestion.   Respiratory: Positive for cough and shortness of breath.    Objective:  BP 144/94 mmHg  Pulse 91  Temp(Src) 99 F (37.2 C) (Oral)  Ht 5\' 1"  (1.549 m)  Wt 185 lb 4 oz (84.029 kg)  BMI 35.02 kg/m2  SpO2 96%  BP/Weight 10/15/2015 [id number]  Systolic BP 123456 [id number]  Diastolic BP 94 82 71  Wt. (Lbs) 185.25 190 195  BMI 35.02 35.92 35.66    Physical Exam  Constitutional: She appears well-developed.  Increased WOB noted.  HENT:  Head: Normocephalic and atraumatic.  Mouth/Throat: No  oropharyngeal exudate.  Normal TMs bilaterally.  Cardiovascular: Normal rate and regular rhythm.   Pulmonary/Chest:  Increased work of breathing noted. Normal breath sounds throughout. No wheezing appreciated.  Neurological: She is alert.  Psychiatric: She has a normal mood and affect.  Vitals reviewed.  Lab Results  Component Value Date   WBC 6.6 07/19/2015   HGB 12.2 07/19/2015   HCT 36.8 07/19/2015   PLT 197 07/19/2015   GLUCOSE 195* 07/19/2015   CHOL 181 05/13/2015   TRIG 110.0 05/13/2015   HDL 66.90 05/13/2015   LDLDIRECT 51.9 09/07/2012   LDLCALC 92 05/13/2015   ALT 19 07/19/2015   AST 30 07/19/2015   NA 137 07/19/2015   K 4.2 07/19/2015   CL 103 07/19/2015   CREATININE 1.17* 07/19/2015   BUN 21* 07/19/2015   CO2 27 07/19/2015   TSH 1.35 02/25/2014   INR 1.01 03/27/2015   HGBA1C 8.9* 01/01/2014    Assessment & Plan:   Problem List Items Addressed This Visit    Acute exacerbation of chronic obstructive pulmonary disease (COPD) ([place])    History exam consistent with acute COPD exacerbation. Patient given IM Depo-Medrol 40 mg today. Treating with doxycycline. Advised continuation of home Symbicort. Patient to follow-up if she fails to improve or worsens.         Meds ordered this encounter  Medications  . doxycycline (VIBRA-TABS)  100 MG tablet    Sig: Take 1 tablet (100 mg total) by mouth 2 (two) times daily.    Dispense:  20 tablet    Refill:  0    Follow-up: Return if symptoms worsen or fail to improve.  [place]

## 2015-10-20 ENCOUNTER — Encounter: Payer: Self-pay | Admitting: Cardiovascular Disease

## 2015-10-20 ENCOUNTER — Ambulatory Visit (INDEPENDENT_AMBULATORY_CARE_PROVIDER_SITE_OTHER): Payer: Medicare Other | Admitting: Cardiovascular Disease

## 2015-10-20 VITALS — BP 128/68 | HR 85 | Ht 62.0 in | Wt 183.5 lb

## 2015-10-20 DIAGNOSIS — I2510 Atherosclerotic heart disease of native coronary artery without angina pectoris: Secondary | ICD-10-CM

## 2015-10-20 DIAGNOSIS — I2583 Coronary atherosclerosis due to lipid rich plaque: Secondary | ICD-10-CM

## 2015-10-20 DIAGNOSIS — J449 Chronic obstructive pulmonary disease, unspecified: Secondary | ICD-10-CM | POA: Diagnosis not present

## 2015-10-20 DIAGNOSIS — J4489 Other specified chronic obstructive pulmonary disease: Secondary | ICD-10-CM

## 2015-10-20 DIAGNOSIS — I10 Essential (primary) hypertension: Secondary | ICD-10-CM

## 2015-10-20 NOTE — Assessment & Plan Note (Signed)
Prior cardiac catheterization with no significant disease Stressed importance of aggressive diabetes control

## 2015-10-20 NOTE — Assessment & Plan Note (Signed)
We have congratulated her on her weight loss, encouraged further weight loss

## 2015-10-20 NOTE — Patient Instructions (Signed)
You are doing well. No medication changes were made.  Please call us if you have new issues that need to be addressed before your next appt.  Your physician wants you to follow-up in: 12 months.  You will receive a reminder letter in the mail two months in advance. If you don't receive a letter, please call our office to schedule the follow-up appointment. 

## 2015-10-20 NOTE — Assessment & Plan Note (Signed)
Blood pressure is well controlled on today's visit. No changes made to the medications. 

## 2015-10-20 NOTE — Progress Notes (Signed)
Patient ID: Andrea Stewart, female    DOB: 07/23/1946, 69 y.o.   MRN: [id number]  HPI Comments: [name] is a 69 year old woman with history of obesity, poorly controlled diabetes, followed by Dr. Soyla Murphy, prior smoking history ,  who presents for routine followup of her shortness of breath, hypertension.  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 patient of   Dr. Derrel Nip  In follow-up today, she reports that she is doing well. She has lost at least 13 pounds from her prior clinic visit in early 2016 Sugars have been up and down despite this weight loss. Recently had prednisone shot for COPD exacerbation, shortness of breath This seemed to do the trick, breathing is much better. She reports having a slight tremor, noticing this in her head.  Significant stress at home, daughter with cancer with metastases Review of lab work with her shows creatinine 1.17, cholesterol is up from 150 up to 180  EKG on today's visit shows normal sinus rhythm with rate 85 bpm, rare PVC  Other past medical history  bronchitis at the end of December 2015 with antibiotics. For shortness of breath, she was treated with Lasix 40 mg daily. Creatinine climbed up to 1.4 with elevated BUN.  History of leg cramping, She takes tonic water, mustard for relief.  Cardiac catheterization by report more than 7 years ago that showed no significant CAD. She was previously seen by Dr. Olevia Perches.  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 Limited secondary to her arthritis conditions.  She is unable to tolerate amlodipine secondary to leg swelling, Diovan HCT (leg cramping) losartan (patient self discontinued after no BP response in a weekend - though it would take longer to see a response), valsartan (uncertain reason),  and clonidine (fatigue).     Allergies  Allergen Reactions  . Morphine  Anaphylaxis  . Prednisone Other (See Comments)    "makes me think I am having a heart attack"  . Gabapentin Other (See Comments)    intolerance  . Nsaids Diarrhea  . Penicillins Other (See Comments)    REACTION: rash, swelling. Remote reaction as a child to [place]. Patient did tolerate recent course of augmentin     Outpatient Encounter Prescriptions as of 10/20/2015  Medication Sig  . albuterol (PROVENTIL HFA) 108 (90 BASE) MCG/ACT inhaler Inhale 2 puffs into the lungs every 4 (four) hours as needed for wheezing or shortness of breath.  Marland Kitchen aspirin 81 MG tablet Take 1 tablet (81 mg total) by mouth daily.  . benzonatate (TESSALON) 200 MG capsule Take 1 capsule (200 mg total) by mouth 3 (three) times daily as needed for cough.  . bisacodyl (DULCOLAX) 10 MG suppository Place 1 suppository (10 mg total) rectally as needed for moderate constipation.  . budesonide-formoterol (SYMBICORT) 160-4.5 MCG/ACT inhaler Inhale 2 puffs into the lungs 2 (two) times daily.  . calcium carbonate (OS-CAL) 600 MG TABS Take 600 mg by mouth daily.    . cyclobenzaprine (FLEXERIL) 10 MG tablet   . DAILY MULTIPLE VITAMINS PO Take 1 tablet by mouth daily.    Marland Kitchen diltiazem (CARDIZEM CD) 180 MG 24 hr capsule Take 180 mg by mouth daily.   Marland Kitchen doxycycline (VIBRA-TABS) 100 MG tablet Take 1 tablet (100 mg total) by mouth 2 (two) times daily.  . fluticasone (VERAMYST) 27.5 MCG/SPRAY nasal spray Place 2  sprays into the nose as needed for rhinitis or allergies.   Marland Kitchen guaiFENesin-codeine (CHERATUSSIN AC) 100-10 MG/5ML syrup Take 5 mLs by mouth at bedtime.  . insulin lispro (HUMALOG) 100 UNIT/ML injection Inject into the skin as directed. Sliding scale 100-150 = 2-2.5 units 150-200 = 3-3.5 units 200-250 = 4-4.5 units 251-300 = 5-5.5 units 301-350 = 6-6.5 units 350-400 = 7-7.5 units  . insulin NPH (HUMULIN N,NOVOLIN N) 100 UNIT/ML injection Inject 20 Units into the skin daily before breakfast. 22 units at bedtime  . metFORMIN  (GLUCOPHAGE-XR) 500 MG 24 hr tablet Take 500 mg by mouth 2 (two) times daily.   . methocarbamol (ROBAXIN) 500 MG tablet Take 500 mg by mouth every 6 (six) hours as needed for muscle spasms.   . Misc Natural Products (TURMERIC CURCUMIN) CAPS 900mg  capsule daily  . mometasone (NASONEX) 50 MCG/ACT nasal spray Place 2 sprays into the nose daily.    . nitroGLYCERIN (NITROSTAT) 0.4 MG SL tablet Place 1 tablet (0.4 mg total) under the tongue every 5 (five) minutes as needed. Maximum 3 tablets (Patient taking differently: Place 0.4 mg under the tongue every 5 (five) minutes as needed for chest pain. Maximum 3 tablets)  . sodium chloride (OCEAN) 0.65 % SOLN nasal spray Place 2 sprays into both nostrils as needed for congestion.  Marland Kitchen Spacer/Aero-Holding Chambers (AEROCHAMBER MV) inhaler Use as instructed  . traMADol (ULTRAM) 50 MG tablet Take 50 mg by mouth every 8 (eight) hours as needed for moderate pain or severe pain.  . valsartan-hydrochlorothiazide (DIOVAN-HCT) 320-25 MG per tablet TAKE ONE (1) TABLET EACH DAY  . zolpidem (AMBIEN) 10 MG tablet Take 10 mg by mouth at bedtime as needed.    . [DISCONTINUED] doxazosin (CARDURA) 2 MG tablet TAKE ONE-HALF (1/2) TABLET EVERY DAY (Patient not taking: Reported on 10/20/2015)   No facility-administered encounter medications on file as of 10/20/2015.    Past Medical History  Diagnosis Date  . Myalgia and myositis, unspecified   . Plantar fascial fibromatosis   . Personal history of tobacco use, presenting hazards to health     1/2 ppd  . Degeneration of intervertebral disc, site unspecified   . Backache, unspecified     s/p c-spine and l-spine fusions  . Obesity, unspecified   . Disorder of bone and cartilage, unspecified   . Unspecified hereditary and idiopathic peripheral neuropathy     secondary to diabetes  . Unspecified essential hypertension   . Type II or unspecified type diabetes mellitus without mention of complication, not stated as uncontrolled    . Depressive disorder, not elsewhere classified   . GERD (gastroesophageal reflux disease)   . S/p nephrectomy   . S/P cholecystectomy   . Hx of left heart catheterization by cutdown     a. 7 years ago; no significant cad; b. Lexiscan 2014: no significant ischemia, no significant EKG changes concerning for ischemia, EF 67%, overall low risk study  . HTN (hypertension)     Past Surgical History  Procedure Laterality Date  . Appendectomy    . Carpal tunnel release    . Cholecystectomy    . Vesicovaginal fistula closure w/ tah    . Replacement total knee  10/2004  . Foot surgery      x 3  . Elbow surgery    . Trigger finger surgery    . Kidney donation  45  . Cervical discectomy    . Lumbar fusion    . Colonoscopy  09/1999  colonoscopy-hemorrhoids, re-check 5 years (10/2006)  . Cardiac catheterization      o.k. 2003  . Dexa      osteopenia (01/2004)  . Neck surgery      06/2005  . Back surgery      2006  . Problem with general anesthesia      02/2006  . Abdominal hysterectomy    . Video bronchoscopy Bilateral 04/02/2015    Procedure: VIDEO BRONCHOSCOPY WITHOUT FLUORO;  Surgeon: Juanito Doom, MD;  Location: [facility];  Service: Cardiopulmonary;  Laterality: Bilateral;    Social History  reports that she has been smoking Cigarettes.  She has a 11.5 pack-year smoking history. She has never used smokeless tobacco. She reports that she does not drink alcohol or use illicit drugs.  Family History family history includes COPD in her father; Cancer in her father and mother; Colon cancer in her mother; Coronary artery disease in her mother; Heart attack (age of onset: 46) in her brother; Hypertension in her mother; Prostate cancer in her father; Sleep apnea in her brother.   Review of Systems  Constitutional: Negative.   Respiratory: Negative.   Cardiovascular: Negative.   Gastrointestinal: Negative.   Musculoskeletal: Positive for back pain, joint swelling,  arthralgias and gait problem.  Neurological: Negative.   Psychiatric/Behavioral: The patient is nervous/anxious.   All other systems reviewed and are negative.   BP 128/68 mmHg  Pulse 85  Ht 5\' 2"  (1.575 m)  Wt 183 lb 8 oz (83.235 kg)  BMI 33.55 kg/m2  Physical Exam  Constitutional: She is oriented to person, place, and time. She appears well-developed and well-nourished.  Morbidly obese  HENT:  Head: Normocephalic.  Nose: Nose normal.  Mouth/Throat: Oropharynx is clear and moist.  Eyes: Conjunctivae are normal. Pupils are equal, round, and reactive to light.  Neck: Normal range of motion. Neck supple. No JVD present.  Cardiovascular: Normal rate, regular rhythm, S1 normal, S2 normal, normal heart sounds and intact distal pulses.  Exam reveals no gallop and no friction rub.   No murmur heard. Pulmonary/Chest: Effort normal and breath sounds normal. No respiratory distress. She has no wheezes. She has no rales. She exhibits no tenderness.  Abdominal: Soft. Bowel sounds are normal. She exhibits no distension. There is no tenderness.  Musculoskeletal: Normal range of motion. She exhibits no edema or tenderness.  Lymphadenopathy:    She has no cervical adenopathy.  Neurological: She is alert and oriented to person, place, and time. Coordination normal.  Skin: Skin is warm and dry. No rash noted. No erythema.  Psychiatric: She has a normal mood and affect. Her behavior is normal. Judgment and thought content normal.    Assessment and Plan  Nursing note and vitals reviewed.

## 2015-10-20 NOTE — Assessment & Plan Note (Signed)
Improved shortness of breath after prednisone treatment, IM

## 2015-10-29 ENCOUNTER — Ambulatory Visit (INDEPENDENT_AMBULATORY_CARE_PROVIDER_SITE_OTHER): Payer: Medicare Other | Admitting: Internal Medicine

## 2015-10-29 ENCOUNTER — Encounter: Payer: Self-pay | Admitting: Internal Medicine

## 2015-10-29 VITALS — BP 154/80 | HR 76 | Temp 98.2°F | Resp 14 | Ht 62.0 in | Wt 184.1 lb

## 2015-10-29 DIAGNOSIS — J441 Chronic obstructive pulmonary disease with (acute) exacerbation: Secondary | ICD-10-CM | POA: Diagnosis not present

## 2015-10-29 DIAGNOSIS — R6889 Other general symptoms and signs: Secondary | ICD-10-CM

## 2015-10-29 DIAGNOSIS — I10 Essential (primary) hypertension: Secondary | ICD-10-CM

## 2015-10-29 LAB — POCT INFLUENZA A/B
Influenza A, POC: NEGATIVE
Influenza B, POC: NEGATIVE

## 2015-10-29 MED ORDER — HYDROCHLOROTHIAZIDE 25 MG PO TABS: 25.0000 mg | ORAL_TABLET | Freq: Every day | ORAL | Status: DC

## 2015-10-29 MED ORDER — METHYLPREDNISOLONE ACETATE 40 MG/ML IJ SUSP
40.0000 mg | Freq: Once | INTRAMUSCULAR | Status: AC
  Administered 2015-10-29: 40 mg via INTRAMUSCULAR

## 2015-10-29 MED ORDER — LEVOFLOXACIN 500 MG PO TABS: 500.0000 mg | ORAL_TABLET | Freq: Every day | ORAL | Status: DC

## 2015-10-29 MED ORDER — GUAIFENESIN-CODEINE 100-10 MG/5ML PO SYRP: 5.0000 mL | ORAL_SOLUTION | Freq: Three times a day (TID) | ORAL | Status: DC | PRN

## 2015-10-29 MED ORDER — VALSARTAN 160 MG PO TABS: 320.0000 mg | ORAL_TABLET | Freq: Every day | ORAL | Status: DC

## 2015-10-29 NOTE — Patient Instructions (Addendum)
You are having a COPD exacerbation   If you become more short of breath than you are now,  You need to go to the nearest ER immediately or call 911  I am treating you  with the following:  IM Injection of Depo Medrol (steroid)  levaquin Continue your spiriva daily .  Use your albuterol inhaler as needed Take Cheratussin for cough  Afrin nasal spray once or twice daily for 5 days    Please take a probiotic ( Align, Floraque or Culturelle), or  the generic version of one of these  For a minimum of 3 weeks to prevent a serious antibiotic associated diarrhea  Called clostridium dificile colitis    I am separating your Diovan into 2 pills:  Take a diovan and a hctz in the morning Take the second diovan in the evening

## 2015-10-29 NOTE — Progress Notes (Signed)
Pre-visit discussion using our clinic review tool. No additional management support is needed unless otherwise documented below in the visit note.  

## 2015-10-29 NOTE — Progress Notes (Signed)
Subjective:  Patient ID: Edgardo Roys, female    DOB: 12-Dec-1945  Age: 69 y.o. MRN: [id number]  CC: The primary encounter diagnosis was Acute exacerbation of chronic obstructive pulmonary disease (COPD) ([place]). Diagnoses of Flu-like symptoms and Essential hypertension were also pertinent to this visit.  HPI ALLYSON RIMA presents for recurrent symptoms of cough, and wheezing. She was treated Nov 30 for bronchitis with doxycycline and and IM  Depo Medrol.  Symptoms improved for several days,  But subsequently developed right ear pain accompanied by chest pain,  Productive cough, myalgias , purulent sputum .   Symptoms have  Been present for the past week ,  Without  Fevers.     Outpatient Prescriptions Prior to Visit  Medication Sig Dispense Refill  . albuterol (PROVENTIL HFA) 108 (90 BASE) MCG/ACT inhaler Inhale 2 puffs into the lungs every 4 (four) hours as needed for wheezing or shortness of breath. 1 Inhaler 2  . aspirin 81 MG tablet Take 1 tablet (81 mg total) by mouth daily.    . bisacodyl (DULCOLAX) 10 MG suppository Place 1 suppository (10 mg total) rectally as needed for moderate constipation. 12 suppository 0  . budesonide-formoterol (SYMBICORT) 160-4.5 MCG/ACT inhaler Inhale 2 puffs into the lungs 2 (two) times daily. 1 Inhaler 5  . calcium carbonate (OS-CAL) 600 MG TABS Take 600 mg by mouth daily.      . cyclobenzaprine (FLEXERIL) 10 MG tablet     . DAILY MULTIPLE VITAMINS PO Take 1 tablet by mouth daily.      Marland Kitchen diltiazem (CARDIZEM CD) 180 MG 24 hr capsule Take 180 mg by mouth daily.     . fluticasone (VERAMYST) 27.5 MCG/SPRAY nasal spray Place 2 sprays into the nose as needed for rhinitis or allergies.     Marland Kitchen insulin lispro (HUMALOG) 100 UNIT/ML injection Inject into the skin as directed. Sliding scale 100-150 = 2-2.5 units 150-200 = 3-3.5 units 200-250 = 4-4.5 units 251-300 = 5-5.5 units 301-350 = 6-6.5 units 350-400 = 7-7.5 units    . insulin NPH (HUMULIN N,NOVOLIN  N) 100 UNIT/ML injection Inject 20 Units into the skin daily before breakfast. 22 units at bedtime    . metFORMIN (GLUCOPHAGE-XR) 500 MG 24 hr tablet Take 500 mg by mouth 2 (two) times daily.     . methocarbamol (ROBAXIN) 500 MG tablet Take 500 mg by mouth every 6 (six) hours as needed for muscle spasms.     . Misc Natural Products (TURMERIC CURCUMIN) CAPS 900mg  capsule daily    . mometasone (NASONEX) 50 MCG/ACT nasal spray Place 2 sprays into the nose daily.      . nitroGLYCERIN (NITROSTAT) 0.4 MG SL tablet Place 1 tablet (0.4 mg total) under the tongue every 5 (five) minutes as needed. Maximum 3 tablets (Patient taking differently: Place 0.4 mg under the tongue every 5 (five) minutes as needed for chest pain. Maximum 3 tablets) 25 tablet 6  . sodium chloride (OCEAN) 0.65 % SOLN nasal spray Place 2 sprays into both nostrils as needed for congestion.    Marland Kitchen Spacer/Aero-Holding Chambers (AEROCHAMBER MV) inhaler Use as instructed 1 each 0  . traMADol (ULTRAM) 50 MG tablet Take 50 mg by mouth every 8 (eight) hours as needed for moderate pain or severe pain.    Marland Kitchen zolpidem (AMBIEN) 10 MG tablet Take 10 mg by mouth at bedtime as needed.      . benzonatate (TESSALON) 200 MG capsule Take 1 capsule (200 mg total) by mouth 3 (  three) times daily as needed for cough. 60 capsule 1  . valsartan-hydrochlorothiazide (DIOVAN-HCT) 320-25 MG per tablet TAKE ONE (1) TABLET EACH DAY 30 tablet 6  . doxycycline (VIBRA-TABS) 100 MG tablet Take 1 tablet (100 mg total) by mouth 2 (two) times daily. (Patient not taking: Reported on 10/29/2015) 20 tablet 0  . guaiFENesin-codeine (CHERATUSSIN AC) 100-10 MG/5ML syrup Take 5 mLs by mouth at bedtime. (Patient not taking: Reported on 10/29/2015) 120 mL 0   No facility-administered medications prior to visit.    Review of Systems;  Patient denies , unintentional weight loss, skin rash, eye pain, s, sore throat, dysphagia,  hemoptysis ,  palpitations, orthopnea, edema, abdominal  pain, nausea, melena, diarrhea, constipation, flank pain, dysuria, hematuria, urinary  Frequency, nocturia, numbness, tingling, seizures,  Focal weakness, Loss of consciousness,  Tremor, insomnia, depression, anxiety, and suicidal ideation.      Objective:  BP 154/80 mmHg  Pulse 76  Temp(Src) 98.2 F (36.8 C) (Oral)  Resp 14  Ht 5\' 2"  (1.575 m)  Wt 184 lb 2 oz (83.519 kg)  BMI 33.67 kg/m2  SpO2 95%  BP Readings from Last 3 Encounters:  10/29/15 154/80  10/20/15 128/68  10/15/15 144/94    Wt Readings from Last 3 Encounters:  10/29/15 184 lb 2 oz (83.519 kg)  10/20/15 183 lb 8 oz (83.235 kg)  10/15/15 185 lb 4 oz (84.029 kg)    General appearance: alert, cooperative and appears stated age 51: Head: Normocephalic, without obvious abnormality, atraumatic, sinuses tender to percussion Eyes: conjunctivae/corneas clear. PERRL, EOM's intact. Fundi benign. Ears: normal TM's and external ear canals both ears Nose: Nares normal. Septum midline. Mucosa injected and red . Maxillary  sinus tenderness bilateral, no crusting or bleeding points Throat: lips, mucosa, and tongue normal; teeth and gums normal Back: symmetric, no curvature. ROM normal. No CVA tenderness. Lungs: fair air movement ,  Diffuse ronchi, occasional wheezes Heart: regular rate and rhythm, S1, S2 normal, no murmur, click, rub or gallop Abdomen: soft, non-tender; bowel sounds normal; no masses,  no organomegaly Pulses: 2+ and symmetric Skin: Skin color, texture, turgor normal. No rashes or lesions Lymph nodes: Cervical, supraclavicular, and axillary nodes normal.  Lab Results  Component Value Date   HGBA1C 8.9* 01/01/2014   HGBA1C 9.1* 07/30/2011    Lab Results  Component Value Date   CREATININE 1.17* 07/19/2015   CREATININE 1.43* 05/13/2015   CREATININE 1.6* 05/05/2015    Lab Results  Component Value Date   WBC 6.6 07/19/2015   HGB 12.2 07/19/2015   HCT 36.8 07/19/2015   PLT 197 07/19/2015   GLUCOSE  195* 07/19/2015   CHOL 181 05/13/2015   TRIG 110.0 05/13/2015   HDL 66.90 05/13/2015   LDLDIRECT 51.9 09/07/2012   LDLCALC 92 05/13/2015   ALT 19 07/19/2015   AST 30 07/19/2015   NA 137 07/19/2015   K 4.2 07/19/2015   CL 103 07/19/2015   CREATININE 1.17* 07/19/2015   BUN 21* 07/19/2015   CO2 27 07/19/2015   TSH 1.35 02/25/2014   INR 1.01 03/27/2015   HGBA1C 8.9* 01/01/2014    Dg Chest 2 View  08/11/2015  CLINICAL DATA:  Routine checkup. Chest tightness. Wheezing, hypertension, diabetic, smoker. EXAM: CHEST  2 VIEW COMPARISON:  07/19/2015 FINDINGS: There is hyperinflation of the lungs compatible with COPD. Heart is upper limits normal in size. Lungs are clear. No effusions. No acute bony abnormality. IMPRESSION: COPD.  No active disease. Electronically Signed   By: Rolm Baptise  M.D.   On: 08/11/2015 15:21    Assessment & Plan:   Problem List Items Addressed This Visit    Essential hypertension    Well controlled on current regimen. Renal function stable, no changes today.  Lab Results  Component Value Date   CREATININE 1.17* 07/19/2015   Lab Results  Component Value Date   NA 137 07/19/2015   K 4.2 07/19/2015   CL 103 07/19/2015   CO2 27 07/19/2015         Relevant Medications   valsartan (DIOVAN) 160 MG tablet   hydrochlorothiazide (HYDRODIURIL) 25 MG tablet   Acute exacerbation of chronic obstructive pulmonary disease (COPD) ([place]) - Primary    Current presentation suggest viral etiology. However she has COPD.  Will treat supportively with addition of levaquin and steroids, ,        Relevant Medications   guaiFENesin-codeine (ROBITUSSIN AC) 100-10 MG/5ML syrup   methylPREDNISolone acetate (DEPO-MEDROL) injection 40 mg (Completed)    Other Visit Diagnoses    Flu-like symptoms        Relevant Medications    methylPREDNISolone acetate (DEPO-MEDROL) injection 40 mg (Completed)    Other Relevant Orders    POCT Influenza A/B (Completed)      A total of 25  minutes of face to face time was spent with patient more than half of which was spent in counselling about the above mentioned conditions  and coordination of care  I have discontinued Ms. Siefker's benzonatate and valsartan-hydrochlorothiazide. I am also having her start on levofloxacin, guaiFENesin-codeine, valsartan, and hydrochlorothiazide. Additionally, I am having her maintain her calcium carbonate, DAILY MULTIPLE VITAMINS PO, insulin lispro, fluticasone, mometasone, zolpidem, insulin NPH Human, methocarbamol, aspirin, nitroGLYCERIN, metFORMIN, sodium chloride, cyclobenzaprine, traMADol, albuterol, Turmeric Curcumin, AEROCHAMBER MV, guaiFENesin-codeine, budesonide-formoterol, bisacodyl, doxycycline, and diltiazem. We administered methylPREDNISolone acetate.  Meds ordered this encounter  Medications  . levofloxacin (LEVAQUIN) 500 MG tablet    Sig: Take 1 tablet (500 mg total) by mouth daily.    Dispense:  7 tablet    Refill:  0  . guaiFENesin-codeine (ROBITUSSIN AC) 100-10 MG/5ML syrup    Sig: Take 5 mLs by mouth 3 (three) times daily as needed for cough.    Dispense:  180 mL    Refill:  1  . valsartan (DIOVAN) 160 MG tablet    Sig: Take 2 tablets (320 mg total) by mouth daily.    Dispense:  60 tablet    Refill:  5  . hydrochlorothiazide (HYDRODIURIL) 25 MG tablet    Sig: Take 1 tablet (25 mg total) by mouth daily.    Dispense:  90 tablet    Refill:  3  . methylPREDNISolone acetate (DEPO-MEDROL) injection 40 mg    Sig:     Medications Discontinued During This Encounter  Medication Reason  . valsartan-hydrochlorothiazide (DIOVAN-HCT) 320-25 MG per tablet   . benzonatate (TESSALON) 200 MG capsule     Follow-up: No Follow-up on file.   Crecencio Mc, MD

## 2015-11-01 NOTE — Assessment & Plan Note (Signed)
Current presentation suggest viral etiology. However she has COPD.  Will treat supportively with addition of levaquin and steroids, ,

## 2015-11-01 NOTE — Assessment & Plan Note (Signed)
Well controlled on current regimen. Renal function stable, no changes today.  Lab Results  Component Value Date   CREATININE 1.17* 07/19/2015   Lab Results  Component Value Date   NA 137 07/19/2015   K 4.2 07/19/2015   CL 103 07/19/2015   CO2 27 07/19/2015

## 2015-11-03 ENCOUNTER — Ambulatory Visit (INDEPENDENT_AMBULATORY_CARE_PROVIDER_SITE_OTHER): Payer: Medicare Other | Admitting: Nurse Practitioner

## 2015-11-03 VITALS — BP 158/80 | HR 53 | Temp 98.8°F

## 2015-11-03 DIAGNOSIS — J029 Acute pharyngitis, unspecified: Secondary | ICD-10-CM | POA: Diagnosis not present

## 2015-11-03 DIAGNOSIS — I10 Essential (primary) hypertension: Secondary | ICD-10-CM

## 2015-11-03 MED ORDER — DILTIAZEM HCL ER COATED BEADS 120 MG PO CP24: 120.0000 mg | ORAL_CAPSULE | Freq: Every day | ORAL | Status: DC

## 2015-11-03 MED ORDER — NYSTATIN 100000 UNIT/ML MT SUSP: 5.0000 mL | Freq: Four times a day (QID) | OROMUCOSAL | Status: DC

## 2015-11-03 NOTE — Patient Instructions (Addendum)
Since you have been on a lot of steroids recently- Use the Nystatin Swish it in your mouth and swallow per directions 4 x a day for 7 days.   Take the Diltiazem 120 mg once daily and follow up with Dr. Derrel Nip if any changes occur. Seek emergency care if you are feeling faint, dizzy, or unable to stand/speak in complete sentences.

## 2015-11-03 NOTE — Progress Notes (Signed)
Patient ID: Andrea Stewart, female    DOB: 12/06/1945  Age: 69 y.o. MRN: [id number]  CC: Sore Throat   HPI Andrea Stewart presents for CC of Sore throat x 3 weeks.   1) Pt has been seen by Dr. Lacinda Axon, Dr. Derrel Nip, and Dr. Rockey Situ since Thanksgiving.  Dr. Lacinda Axon- 10/15/15 treatment:   IM Depo-medrol and Doxycyline and Continuation of inhalers. Dr. Derrel Nip-  10/29/15 Treatment:   Levaquin and steroids  Flu POCT was negative on 10/29/15, Robitussin AC   Today: Pt reports painful swallowing, continued ears feeling stopped up, coughing improving, but still green and productive. She is using her inhalers. Her Pulse is down lower today. Denies symptoms of dizziness or pre-syncope.   History [name] has a past medical history of Myalgia and myositis, unspecified; Plantar fascial fibromatosis; Personal history of tobacco use, presenting hazards to health; Degeneration of intervertebral disc, site unspecified; Backache, unspecified; Obesity, unspecified; Disorder of bone and cartilage, unspecified; Unspecified hereditary and idiopathic peripheral neuropathy; Unspecified essential hypertension; Type II or unspecified type diabetes mellitus without mention of complication, not stated as uncontrolled; Depressive disorder, not elsewhere classified; GERD (gastroesophageal reflux disease); S/p nephrectomy; S/P cholecystectomy; left heart catheterization by cutdown; and HTN (hypertension).   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   Her family history includes COPD in her father; Cancer in her  father and mother; Colon cancer in her mother; Coronary artery disease in her mother; Heart attack (age of onset: 22) in her brother; Hypertension in her mother; Prostate cancer in her father; Sleep apnea in her brother.She reports that she has been smoking Cigarettes.  She has a 11.5 pack-year smoking history. She has never used smokeless tobacco. She reports that she does not drink alcohol or use illicit drugs.  Outpatient Prescriptions Prior to Visit  Medication Sig Dispense Refill  . albuterol (PROVENTIL HFA) 108 (90 BASE) MCG/ACT inhaler Inhale 2 puffs into the lungs every 4 (four) hours as needed for wheezing or shortness of breath. 1 Inhaler 2  . aspirin 81 MG tablet Take 1 tablet (81 mg total) by mouth daily.    . bisacodyl (DULCOLAX) 10 MG suppository Place 1 suppository (10 mg total) rectally as needed for moderate constipation. 12 suppository 0  . budesonide-formoterol (SYMBICORT) 160-4.5 MCG/ACT inhaler Inhale 2 puffs into the lungs 2 (two) times daily. 1 Inhaler 5  . calcium carbonate (OS-CAL) 600 MG TABS Take 600 mg by mouth daily.      . cyclobenzaprine (FLEXERIL) 10 MG tablet     . DAILY MULTIPLE VITAMINS PO Take 1 tablet by mouth daily.      Marland Kitchen doxycycline (VIBRA-TABS) 100 MG tablet Take 1 tablet (100 mg total) by mouth 2 (two) times daily. 20 tablet 0  . fluticasone (VERAMYST) 27.5 MCG/SPRAY nasal spray Place 2 sprays into the nose as needed for rhinitis or allergies.     Marland Kitchen guaiFENesin-codeine (CHERATUSSIN AC) 100-10 MG/5ML syrup Take 5 mLs by mouth at bedtime. 120 mL 0  . guaiFENesin-codeine (ROBITUSSIN AC) 100-10 MG/5ML syrup Take 5 mLs by mouth 3 (three) times daily as needed for cough. 180 mL 1  . hydrochlorothiazide (HYDRODIURIL) 25 MG tablet Take 1 tablet (25 mg total) by mouth daily. [place]  tablet 3  . insulin lispro (HUMALOG) 100 UNIT/ML injection Inject into the skin as directed. Sliding scale 100-150 = 2-2.5 units 150-200 = 3-3.5 units 200-250 = 4-4.5 units 251-300 = 5-5.5  units 301-350 = 6-6.5 units 350-400 = 7-7.5 units    . insulin NPH (HUMULIN N,NOVOLIN N) 100 UNIT/ML injection Inject 20 Units into the skin daily before breakfast. 22 units at bedtime    . levofloxacin (LEVAQUIN) 500 MG tablet Take 1 tablet (500 mg total) by mouth daily. 7 tablet 0  . metFORMIN (GLUCOPHAGE-XR) 500 MG 24 hr tablet Take 500 mg by mouth 2 (two) times daily.     . methocarbamol (ROBAXIN) 500 MG tablet Take 500 mg by mouth every 6 (six) hours as needed for muscle spasms.     . nitroGLYCERIN (NITROSTAT) 0.4 MG SL tablet Place 1 tablet (0.4 mg total) under the tongue every 5 (five) minutes as needed. Maximum 3 tablets (Patient taking differently: Place 0.4 mg under the tongue every 5 (five) minutes as needed for chest pain. Maximum 3 tablets) 25 tablet 6  . sodium chloride (OCEAN) 0.65 % SOLN nasal spray Place 2 sprays into both nostrils as needed for congestion.    Marland Kitchen Spacer/Aero-Holding Chambers (AEROCHAMBER MV) inhaler Use as instructed 1 each 0  . traMADol (ULTRAM) 50 MG tablet Take 50 mg by mouth every 8 (eight) hours as needed for moderate pain or severe pain.    . valsartan (DIOVAN) 160 MG tablet Take 2 tablets (320 mg total) by mouth daily. 60 tablet 5  . zolpidem (AMBIEN) 10 MG tablet Take 10 mg by mouth at bedtime as needed.      . diltiazem (CARDIZEM CD) 180 MG 24 hr capsule Take 180 mg by mouth daily.     . Misc Natural Products (TURMERIC CURCUMIN) CAPS 900mg  capsule daily    . mometasone (NASONEX) 50 MCG/ACT nasal spray Place 2 sprays into the nose daily.       No facility-administered medications prior to visit.    ROS Review of Systems  Constitutional: Positive for fatigue. Negative for fever, chills and diaphoresis.  HENT: Positive for congestion, postnasal drip, rhinorrhea and sore throat. Negative for ear discharge, ear pain and trouble swallowing.   Eyes: Negative for visual disturbance.  Respiratory: Positive for cough. Negative for chest tightness, shortness of  breath and wheezing.   Cardiovascular: Negative for chest pain, palpitations and leg swelling.  Gastrointestinal: Negative for nausea, vomiting and diarrhea.  Skin: Negative for rash.  Neurological: Negative for dizziness, weakness, numbness and headaches.  Psychiatric/Behavioral: The patient is nervous/anxious.     Objective:  BP 158/80 mmHg  Pulse 53  Temp(Src) 98.8 F (37.1 C)  SpO2 95%  Physical Exam  Constitutional: She is oriented to person, place, and time. She appears well-developed and well-nourished. No distress.  HENT:  Head: Normocephalic and atraumatic.  Right Ear: External ear normal.  Left Ear: External ear normal.  Nose: Nose normal.  Mouth/Throat: Oropharynx is clear and moist. No oropharyngeal exudate.  Tm's clear bilaterally without retraction or bulging  Eyes: EOM are normal. Pupils are equal, round, and reactive to light. Right eye exhibits no discharge. Left eye exhibits no discharge. No scleral icterus.  Neck: Normal range of motion. Neck supple.  Cardiovascular: Normal rate, regular rhythm and normal heart sounds.  Exam reveals no gallop and no friction rub.   No murmur heard. Pulmonary/Chest: Effort normal. No respiratory distress. She has wheezes. She has no rales. She exhibits no tenderness.  Lymphadenopathy:    She has no cervical adenopathy.  Neurological: She is alert and oriented to person, place, and time. No cranial nerve deficit. She exhibits normal muscle tone. Coordination normal.  Skin: Skin is warm and dry. No rash noted. She is not diaphoretic.  Psychiatric: She has a normal mood and affect. Her behavior is normal. Judgment and thought content normal.   Assessment & Plan:   Zaleah was seen today for sore throat.  Diagnoses and all orders for this visit:  Sore throat  Essential hypertension  Other orders -     nystatin (MYCOSTATIN) 100000 UNIT/ML suspension; Take 5 mLs (500,000 Units total) by mouth 4 (four) times daily. -      diltiazem (CARDIZEM CD) 120 MG 24 hr capsule; Take 1 capsule (120 mg total) by mouth daily.  I have discontinued Ms. Batiz's diltiazem. I am also having her start on nystatin and diltiazem. Additionally, I am having her maintain her calcium carbonate, DAILY MULTIPLE VITAMINS PO, insulin lispro, fluticasone, mometasone, zolpidem, insulin NPH Human, methocarbamol, aspirin, nitroGLYCERIN, metFORMIN, sodium chloride, cyclobenzaprine, traMADol, albuterol, Turmeric Curcumin, AEROCHAMBER MV, guaiFENesin-codeine, budesonide-formoterol, bisacodyl, doxycycline, levofloxacin, guaiFENesin-codeine, valsartan, and hydrochlorothiazide.  Meds ordered this encounter  Medications  . nystatin (MYCOSTATIN) 100000 UNIT/ML suspension    Sig: Take 5 mLs (500,000 Units total) by mouth 4 (four) times daily.    Dispense:  180 mL    Refill:  0    Order Specific Question:  Supervising Provider    Answer:  Deborra Medina L [[id number]]  . diltiazem (CARDIZEM CD) 120 MG 24 hr capsule    Sig: Take 1 capsule (120 mg total) by mouth daily.    Dispense:  30 capsule    Refill:  0    Order Specific Question:  Supervising Provider    Answer:  Crecencio Mc [[id number]]     Follow-up: Return in about 2 weeks (around 11/17/2015) for Follow up with Dr. Derrel Nip .

## 2015-11-04 ENCOUNTER — Other Ambulatory Visit: Payer: Self-pay | Admitting: Internal Medicine

## 2015-11-04 ENCOUNTER — Telehealth: Payer: Self-pay | Admitting: Internal Medicine

## 2015-11-04 ENCOUNTER — Telehealth: Payer: Self-pay | Admitting: *Deleted

## 2015-11-04 DIAGNOSIS — E7800 Pure hypercholesterolemia, unspecified: Secondary | ICD-10-CM | POA: Diagnosis not present

## 2015-11-04 DIAGNOSIS — E1165 Type 2 diabetes mellitus with hyperglycemia: Secondary | ICD-10-CM | POA: Diagnosis not present

## 2015-11-04 DIAGNOSIS — G609 Hereditary and idiopathic neuropathy, unspecified: Secondary | ICD-10-CM | POA: Diagnosis not present

## 2015-11-04 DIAGNOSIS — I10 Essential (primary) hypertension: Secondary | ICD-10-CM | POA: Diagnosis not present

## 2015-11-04 NOTE — Telephone Encounter (Signed)
Please advise 

## 2015-11-04 NOTE — Telephone Encounter (Signed)
Spoke w/ pt.  She reports that her PCP recently changed her BP meds. HR was 42 at endocrinology, she is not driving. She states that she feels tired, but otherwise asymptomatic. She requests to go back on her previous meds, as "something always happens when they screw with my medicines". She states that she does not think that her meds were adjusted, "she just separated by blood pressure pill from my fluid pill". Advised her that Dr. Derrel Nip just adjusted this med and recommended that she call their office, as there is a reason for the med change. She is agreeable and will call PCP office now.  Asked her to call back if we can be of further assistance.

## 2015-11-04 NOTE — Telephone Encounter (Signed)
Pt heart rate is down to 42 today. Pt states she needs to go back on the medication she was on before. Medication is diltiazem (CARDIZEM CD) 120 MG 24 hr capsule needs to go back to 180 and valsartan (DIOVAN) 160 MG tablet. Pharmacy is [place], [name] - [place]. Thank You!

## 2015-11-04 NOTE — Telephone Encounter (Signed)
She is mistaken.   She needs to stop the diltiazem completely because if her heart rate drops any more she will faint.    The diovan dose was increased  To 320 mg daily  On Dec 14th , because her BP was 30 pts above normal , and it was still elevated when carrie saw her.   she is now supposed to be taking  320 mg daily .  She can take it all  in the morning or split it into morning and evening   The hctz is only taken in the morning  25 mg

## 2015-11-04 NOTE — Telephone Encounter (Signed)
Pt calling stating she just left her Diabetic doctor and her HR right now is 42 Wants to know what to do.  Please advise.

## 2015-11-04 NOTE — Telephone Encounter (Signed)
THIS PATIENT WAS just SEEN BY CARRIE and DILTIAZEM DOSE WAS REDUCED .   IN THE FUTURE,  Messages should be routed to the provider that just saw the patient .    Regarding her request to  resume diltiazem 180 mg,    ABSOLUTELY NOT.  A PULSE OF 42 IS STILL TOO LOW.  SHE NEEDS TO STOP THE DILTIAZEM 120 MG COMPLETELY.    Patient's valsartan was not stopped by [facility] per note. So I don't understand the request

## 2015-11-05 ENCOUNTER — Encounter: Payer: Self-pay | Admitting: Nurse Practitioner

## 2015-11-05 DIAGNOSIS — J029 Acute pharyngitis, unspecified: Secondary | ICD-10-CM | POA: Insufficient documentation

## 2015-11-05 NOTE — Assessment & Plan Note (Addendum)
BP Readings from Last 3 Encounters:  11/03/15 158/80  10/29/15 154/80  10/20/15 128/68   Pt is stable, she has had recent changes to medications. Her HR is low today compared with previous days. She is asymptomatic. Decreased her Diltiazem and asked her to FU with Dr. Derrel Nip within a week or so. Asked her to call 911 if she feels dizzy, faint, or cannot speak in complete sentences. She verbalized agreement.

## 2015-11-05 NOTE — Assessment & Plan Note (Signed)
Nystatin swish and swallow sent to pharmacy. Instructions given verbally and on AVS. No obvious thrush, but will cover since steroids have been given and oral steroid inhalers are frequently used.

## 2015-11-05 NOTE — Telephone Encounter (Signed)
Notified patient as ordered had patient check BP while laying in bed 162/102 reported by p[atient with pulse of 57. Patient did not take any BP medications last night. Patient was instructed to stop the Diltiazem and to continue the diovan and Hctz as instructed. Patient still coughing and short of breath with c/o sore throat and mouth patient has completed the ABX as ordered. Patient was given mouthwash by NP on 11/03/15.

## 2015-11-13 ENCOUNTER — Ambulatory Visit (INDEPENDENT_AMBULATORY_CARE_PROVIDER_SITE_OTHER): Payer: Medicare Other

## 2015-11-13 VITALS — BP 138/78 | HR 85 | Resp 18

## 2015-11-13 DIAGNOSIS — I10 Essential (primary) hypertension: Secondary | ICD-10-CM

## 2015-11-13 NOTE — Progress Notes (Signed)
Patient came in for Heart rate and BP check.  Checked bilateral upper extremities, see vitals for details.  Confirmed with patient that she is only taking the Diovan and HCTZ and she stated yes to both.  Heart rate during exam ranged from 74-91.  Has a follow up appointment with you on the 9th of January.   Please advise?

## 2015-11-17 NOTE — Progress Notes (Signed)
BP and pulse are at goal. Continue current regimen.   I have reviewed the above information and agree with above.   Deborra Medina, MD

## 2015-11-20 IMAGING — CR DG CHEST 2V
2 series · 2 of 2 positions shown · non-contrast
Comparison: 07/19/2015

CLINICAL DATA: Routine checkup. Chest tightness. Wheezing,
hypertension, diabetic, smoker.

EXAM:
CHEST  2 VIEW

[view not recorded (1 of 2)]
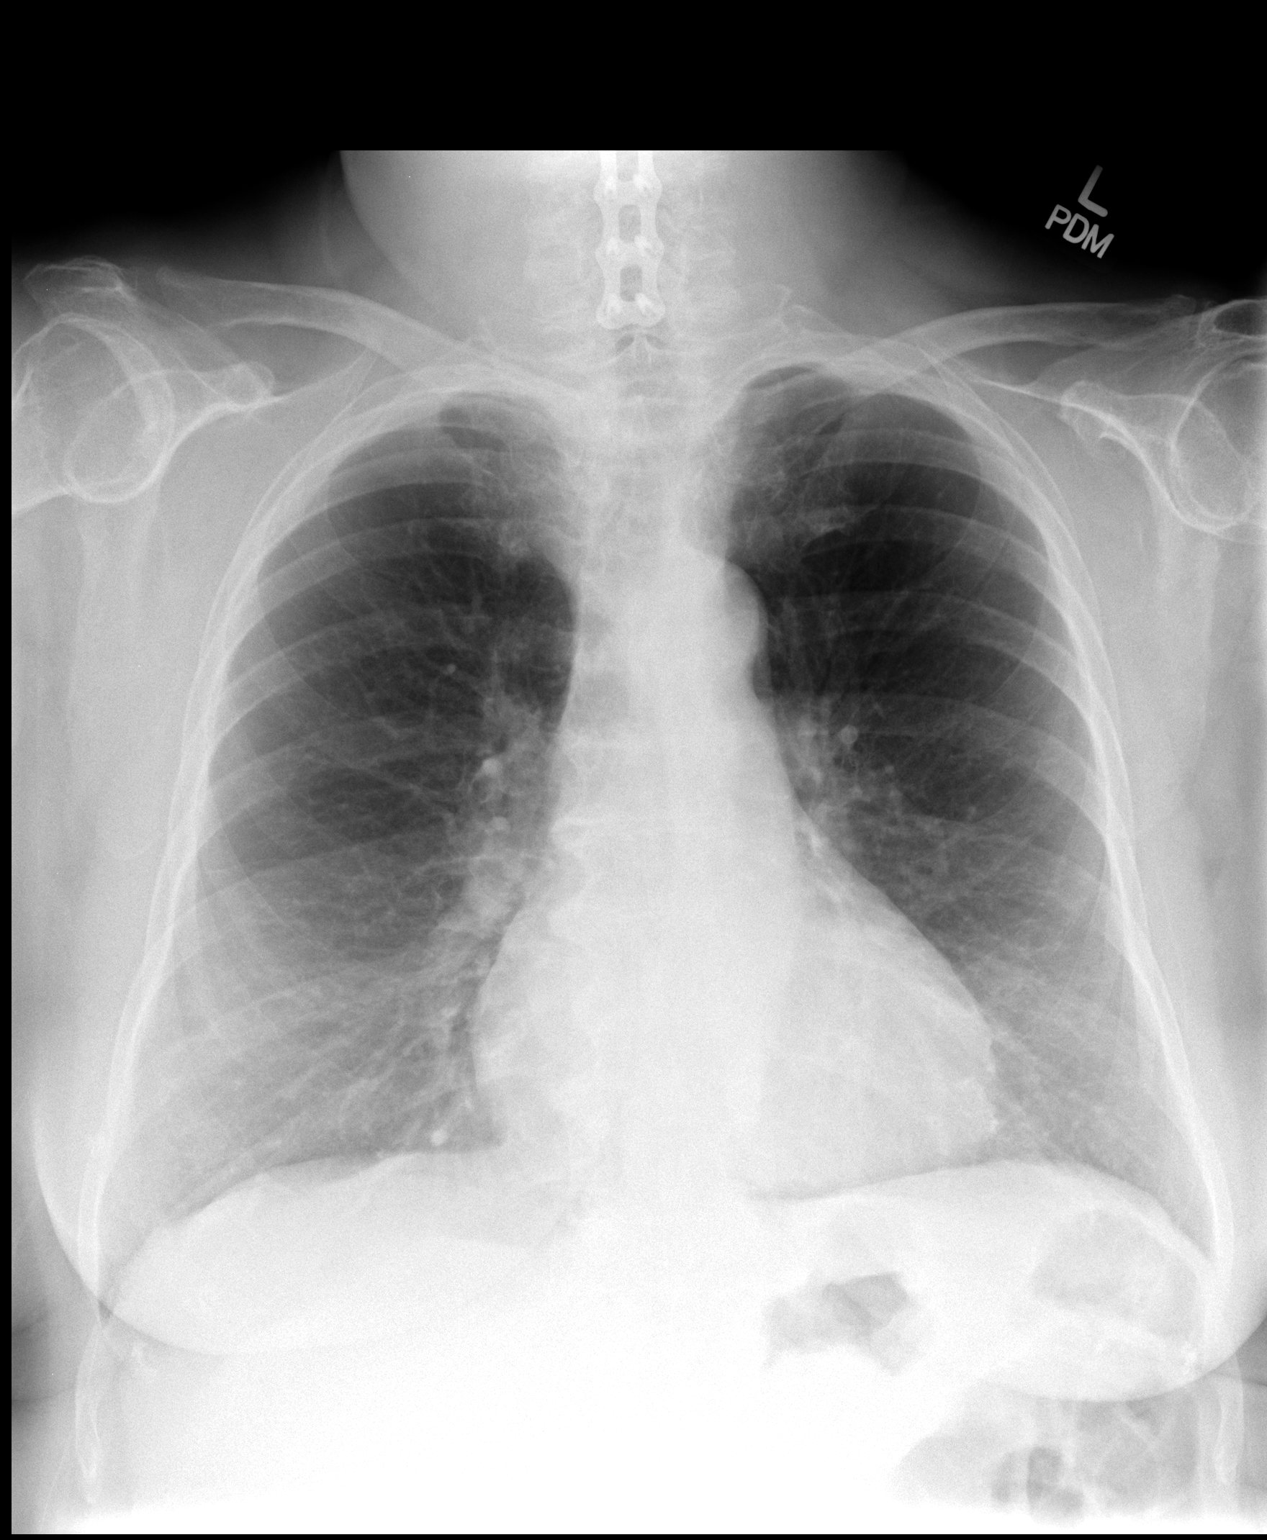

[view not recorded (2 of 2)]
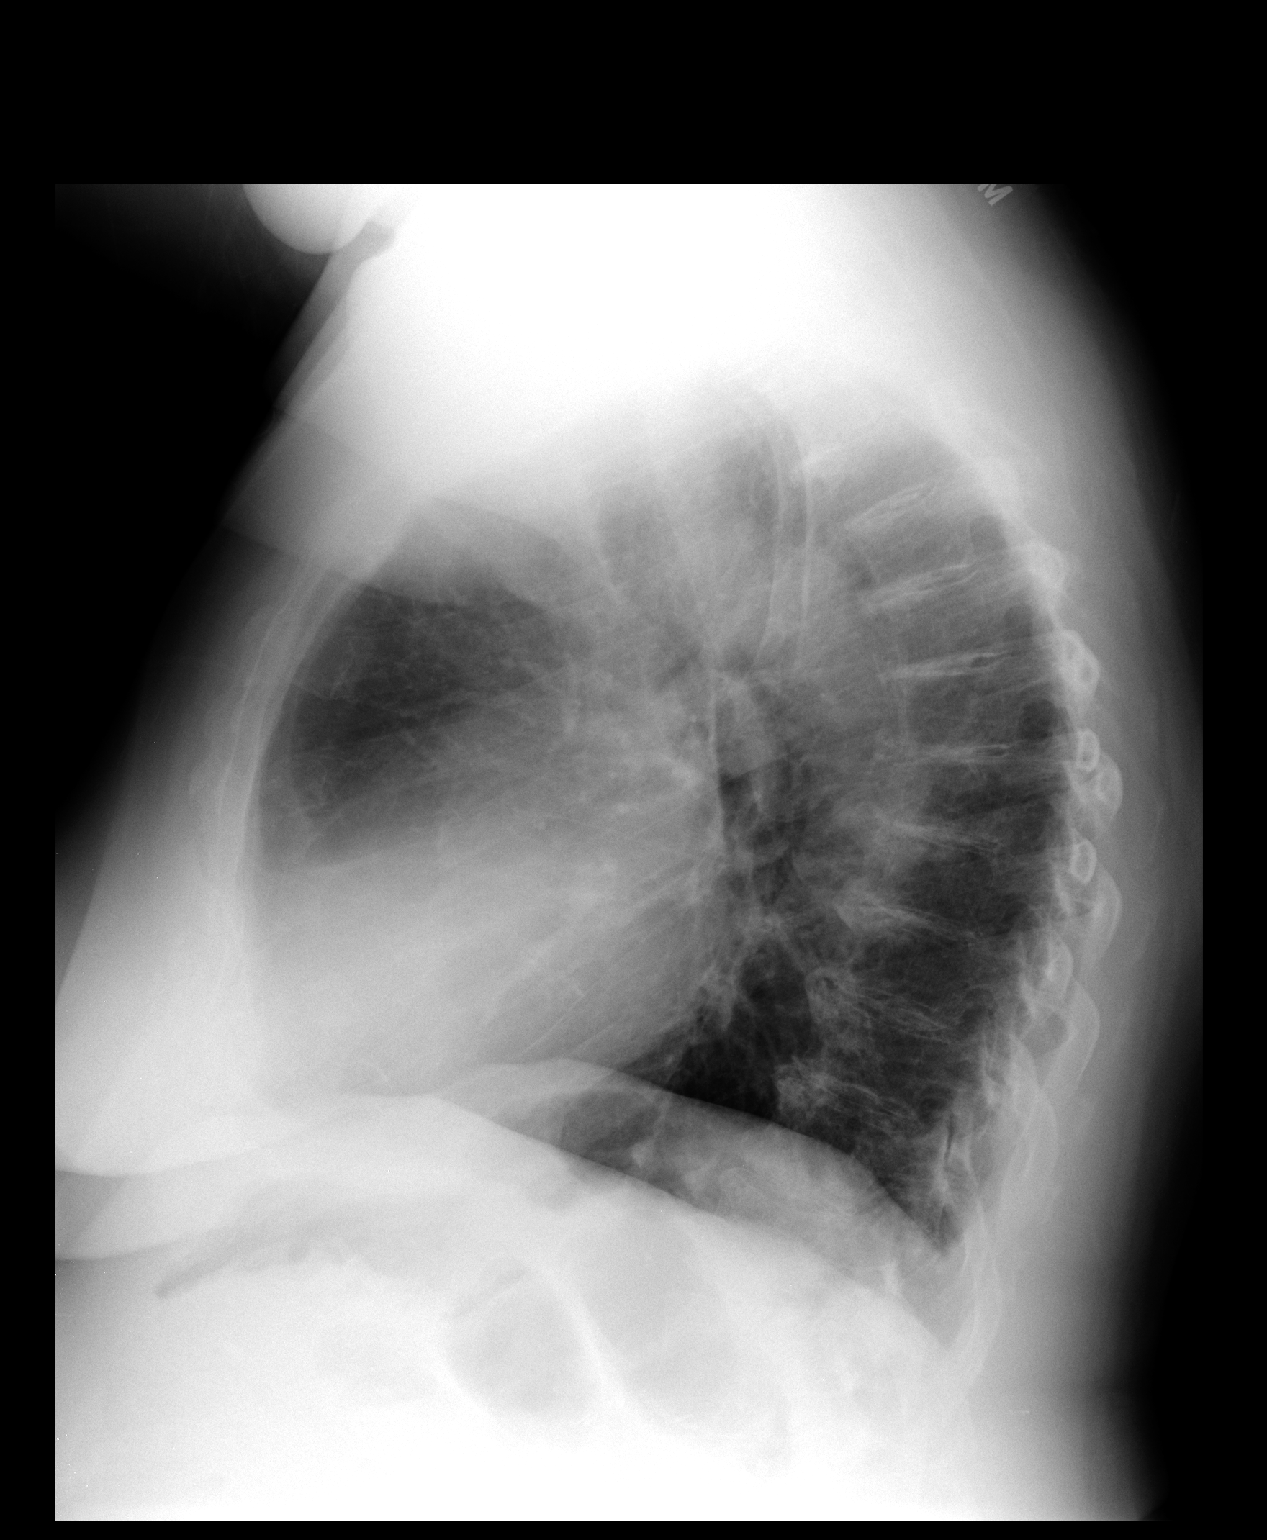

[2 of 2 positions shown; findings below may reference images not displayed]

FINDINGS: There is hyperinflation of the lungs compatible with COPD. Heart is
upper limits normal in size. Lungs are clear. No effusions. No acute
bony abnormality.
IMPRESSION: COPD.  No active disease.

## 2015-11-24 ENCOUNTER — Ambulatory Visit: Payer: Medicare Other | Admitting: Internal Medicine

## 2015-12-01 DIAGNOSIS — E1122 Type 2 diabetes mellitus with diabetic chronic kidney disease: Secondary | ICD-10-CM | POA: Diagnosis not present

## 2015-12-01 DIAGNOSIS — I129 Hypertensive chronic kidney disease with stage 1 through stage 4 chronic kidney disease, or unspecified chronic kidney disease: Secondary | ICD-10-CM | POA: Diagnosis not present

## 2015-12-01 DIAGNOSIS — R319 Hematuria, unspecified: Secondary | ICD-10-CM | POA: Diagnosis not present

## 2015-12-01 DIAGNOSIS — R809 Proteinuria, unspecified: Secondary | ICD-10-CM | POA: Diagnosis not present

## 2015-12-01 DIAGNOSIS — N183 Chronic kidney disease, stage 3 (moderate): Secondary | ICD-10-CM | POA: Diagnosis not present

## 2015-12-11 ENCOUNTER — Ambulatory Visit (INDEPENDENT_AMBULATORY_CARE_PROVIDER_SITE_OTHER): Payer: Medicare Other | Admitting: Pulmonary Disease

## 2015-12-11 ENCOUNTER — Encounter: Payer: Self-pay | Admitting: Pulmonary Disease

## 2015-12-11 VITALS — BP 142/78 | HR 63 | Ht 61.0 in | Wt 183.0 lb

## 2015-12-11 DIAGNOSIS — J449 Chronic obstructive pulmonary disease, unspecified: Secondary | ICD-10-CM | POA: Diagnosis not present

## 2015-12-11 DIAGNOSIS — J4489 Other specified chronic obstructive pulmonary disease: Secondary | ICD-10-CM

## 2015-12-11 DIAGNOSIS — F17200 Nicotine dependence, unspecified, uncomplicated: Secondary | ICD-10-CM

## 2015-12-11 NOTE — Progress Notes (Signed)
Subjective:    Patient ID: Andrea Stewart, female    DOB: [birth year], 70 y.o.   MRN: [id number]  Synopsis: Referred in March 2016 for evaluation of ongoing shortness of breath. She had a CT scan performed at [facility] in 2015 which demonstrated tree-in-bud abnormalities in the right upper lobe as well as a left lower lobe pulmonary nodule. A repeat CT scan was performed in January 2016 showing a right lower lobe pleural-based pulmonary nodule 1.27 m in size. This resolved on a April 2016 CT chest.  11/20/2014 CT chest> tree in bud, inflammatory appearing abnormalities in the right upper lobe, left upper lobe pleural-based 1.2 cm solid nodule 02/2015 CT chest > right upper lobe tree-in-bud abnormalities persistent, the pulmonary nodule seen on the previous study has resolved, thyroid nodule stable 01/2015 PFT> normal ratio but consistent with moderate obstruction (FEV1 1.16L (58% pred)), TLC 4.57L (99% pred), RV 133% pred, DLCO 27.6 (136% pred) 2016 Bronch> AFB bal negative  HPI Chief Complaint  Patient presents with  . Follow-up    pt c/o shortness of breath.    [place] has struggled a little recently.  She had to see Dr. Derrel Nip 3 times one week for a cold that was associated with dyspnea, cough. She is not taking Symbicort because of the cost.  She uses albuterol 2 puffs twice a day.  She remains compliant with Spiriva.  She noted a severe cold going through her family.  She continues to follow with her nephrologist for her CKD.  She worries that the prednisone contributes indirectly to her decline in her COPD.  She continues to smoke.   Past Medical History  Diagnosis Date  . Myalgia and myositis, unspecified   . Plantar fascial fibromatosis   . Personal history of tobacco use, presenting hazards to health     1/2 ppd  . Degeneration of intervertebral disc, site unspecified   . Backache, unspecified     s/p c-spine and l-spine fusions  . Obesity, unspecified   . Disorder of bone and  cartilage, unspecified   . Unspecified hereditary and idiopathic peripheral neuropathy     secondary to diabetes  . Unspecified essential hypertension   . Type II or unspecified type diabetes mellitus without mention of complication, not stated as uncontrolled   . Depressive disorder, not elsewhere classified   . GERD (gastroesophageal reflux disease)   . S/p nephrectomy   . S/P cholecystectomy   . Hx of left heart catheterization by cutdown     a. 7 years ago; no significant cad; b. Lexiscan 2014: no significant ischemia, no significant EKG changes concerning for ischemia, EF 67%, overall low risk study  . HTN (hypertension)       Review of Systems  Constitutional: Negative for fever, chills and fatigue.  HENT: Negative for postnasal drip, rhinorrhea and sinus pressure.   Respiratory: Positive for shortness of breath. Negative for cough and wheezing.   Cardiovascular: Negative for chest pain, palpitations and leg swelling.       Objective:   Physical Exam Filed Vitals:   12/11/15 1450  BP: 142/78  Pulse: 63  Height: 5\' 1"  (1.549 m)  Weight: 183 lb (83.008 kg)  SpO2: 96%   RA  Gen: no acute distress HEENT: NCAT, EOMi, OP clear PULM: CTA B  CV: RRR, no mgr, no JVD AB: BS+, soft, nontender,  Ext: warm, no edema, no clubbing, no cyanosis Derm: no rash or skin breakdown Neuro: A&Ox4, MAEW  CXR from 07/2015 reviewed,  emphysema, no infiltrate Records from her PCP reviewed where she was treated for a flare of COPD    Assessment & Plan:   COPD (chronic obstructive pulmonary disease) with chronic bronchitis She continues to struggle with recurrent exacerbations of COPD. She only has moderate airflow obstruction but she has a somewhat severe phenotype. This is because of ongoing tobacco use and noncompliance with her Symbicort. She underwent a bronchoscopy last year which did not reveal evidence of an atypical or unusual infection.  Plan: She was counseled at length today to  use Symbicort regularly and to stop smoking Continue Spiriva Use albuterol on an as-needed basis Follow-up 2 months or sooner if needed  Tobacco use disorder Unfortunately she continues to smoke cigarettes. This is contributing to her ongoing exacerbations.  Plan: She was counseled at length today to quit smoking. Referral to lung cancer screening program, OK to go to [facility]    Updated Medication List Outpatient Encounter Prescriptions as of 12/11/2015  Medication Sig  . albuterol (PROVENTIL HFA) 108 (90 BASE) MCG/ACT inhaler Inhale 2 puffs into the lungs every 4 (four) hours as needed for wheezing or shortness of breath.  Marland Kitchen amLODipine (NORVASC) 5 MG tablet Take 5 mg by mouth daily.  Marland Kitchen aspirin 81 MG tablet Take 1 tablet (81 mg total) by mouth daily.  . bisacodyl (DULCOLAX) 10 MG suppository Place 1 suppository (10 mg total) rectally as needed for moderate constipation.  . budesonide-formoterol (SYMBICORT) 160-4.5 MCG/ACT inhaler Inhale 2 puffs into the lungs 2 (two) times daily.  . calcium carbonate (OS-CAL) 600 MG TABS Take 600 mg by mouth daily.    . cyclobenzaprine (FLEXERIL) 10 MG tablet   . DAILY MULTIPLE VITAMINS PO Take 1 tablet by mouth daily.    Marland Kitchen diltiazem (CARDIZEM CD) 120 MG 24 hr capsule Take 1 capsule (120 mg total) by mouth daily.  . fluticasone (VERAMYST) 27.5 MCG/SPRAY nasal spray Place 2 sprays into the nose as needed for rhinitis or allergies.   Marland Kitchen guaiFENesin-codeine (CHERATUSSIN AC) 100-10 MG/5ML syrup Take 5 mLs by mouth at bedtime.  . hydrochlorothiazide (HYDRODIURIL) 25 MG tablet Take 1 tablet (25 mg total) by mouth daily.  . insulin lispro (HUMALOG) 100 UNIT/ML injection Inject into the skin as directed. Sliding scale 100-150 = 2-2.5 units 150-200 = 3-3.5 units 200-250 = 4-4.5 units 251-300 = 5-5.5 units 301-350 = 6-6.5 units 350-400 = 7-7.5 units  . insulin NPH (HUMULIN N,NOVOLIN N) 100 UNIT/ML injection Inject 20 Units into the skin daily before  breakfast. 22 units at bedtime  . metFORMIN (GLUCOPHAGE-XR) 500 MG 24 hr tablet Take 500 mg by mouth 2 (two) times daily.   . methocarbamol (ROBAXIN) 500 MG tablet Take 500 mg by mouth every 6 (six) hours as needed for muscle spasms.   . Misc Natural Products (TURMERIC CURCUMIN) CAPS 900mg  capsule daily  . mometasone (NASONEX) 50 MCG/ACT nasal spray Place 2 sprays into the nose daily.    . nitroGLYCERIN (NITROSTAT) 0.4 MG SL tablet Place 1 tablet (0.4 mg total) under the tongue every 5 (five) minutes as needed. Maximum 3 tablets (Patient taking differently: Place 0.4 mg under the tongue every 5 (five) minutes as needed for chest pain. Maximum 3 tablets)  . nystatin (MYCOSTATIN) 100000 UNIT/ML suspension Take 5 mLs (500,000 Units total) by mouth 4 (four) times daily.  . sodium chloride (OCEAN) 0.65 % SOLN nasal spray Place 2 sprays into both nostrils as needed for congestion.  Marland Kitchen Spacer/Aero-Holding Chambers (AEROCHAMBER MV) inhaler Use as instructed  .  traMADol (ULTRAM) 50 MG tablet Take 50 mg by mouth every 8 (eight) hours as needed for moderate pain or severe pain.  . valsartan (DIOVAN) 160 MG tablet Take 2 tablets (320 mg total) by mouth daily.  Marland Kitchen zolpidem (AMBIEN) 10 MG tablet Take 10 mg by mouth at bedtime as needed.    . [DISCONTINUED] doxycycline (VIBRA-TABS) 100 MG tablet Take 1 tablet (100 mg total) by mouth 2 (two) times daily. (Patient not taking: Reported on 12/11/2015)  . [DISCONTINUED] guaiFENesin-codeine (ROBITUSSIN AC) 100-10 MG/5ML syrup Take 5 mLs by mouth 3 (three) times daily as needed for cough. (Patient not taking: Reported on 12/11/2015)  . [DISCONTINUED] levofloxacin (LEVAQUIN) 500 MG tablet Take 1 tablet (500 mg total) by mouth daily. (Patient not taking: Reported on 12/11/2015)   No facility-administered encounter medications on file as of 12/11/2015.

## 2015-12-11 NOTE — Assessment & Plan Note (Addendum)
Unfortunately she continues to smoke cigarettes. This is contributing to her ongoing exacerbations.  Plan: She was counseled at length today to quit smoking. Referral to lung cancer screening program, OK to go to [employer]

## 2015-12-11 NOTE — Assessment & Plan Note (Signed)
She continues to struggle with recurrent exacerbations of COPD. She only has moderate airflow obstruction but she has a somewhat severe phenotype. This is because of ongoing tobacco use and noncompliance with her Symbicort. She underwent a bronchoscopy last year which did not reveal evidence of an atypical or unusual infection.  Plan: She was counseled at length today to use Symbicort regularly and to stop smoking Continue Spiriva Use albuterol on an as-needed basis Follow-up 2 months or sooner if needed

## 2015-12-11 NOTE — Patient Instructions (Signed)
Stop smoking Use Symbicort 2 puffs twice a day no matter how you feel He Spiriva once daily no matter how you feel Use albuterol on an as-needed basis, 2 puffs every 4-6 hours as needed for shortness of breath We will arrange for a CT scan of your chest We will see you back in March

## 2015-12-16 ENCOUNTER — Ambulatory Visit (INDEPENDENT_AMBULATORY_CARE_PROVIDER_SITE_OTHER): Payer: Medicare Other | Admitting: Internal Medicine

## 2015-12-16 ENCOUNTER — Encounter: Payer: Self-pay | Admitting: Internal Medicine

## 2015-12-16 VITALS — BP 138/76 | HR 65 | Temp 98.3°F | Resp 12 | Ht 61.0 in | Wt 186.2 lb

## 2015-12-16 DIAGNOSIS — J4489 Other specified chronic obstructive pulmonary disease: Secondary | ICD-10-CM

## 2015-12-16 DIAGNOSIS — I10 Essential (primary) hypertension: Secondary | ICD-10-CM

## 2015-12-16 DIAGNOSIS — F17210 Nicotine dependence, cigarettes, uncomplicated: Secondary | ICD-10-CM

## 2015-12-16 DIAGNOSIS — Z716 Tobacco abuse counseling: Secondary | ICD-10-CM

## 2015-12-16 DIAGNOSIS — Z1239 Encounter for other screening for malignant neoplasm of breast: Secondary | ICD-10-CM

## 2015-12-16 DIAGNOSIS — J449 Chronic obstructive pulmonary disease, unspecified: Secondary | ICD-10-CM | POA: Diagnosis not present

## 2015-12-16 NOTE — Progress Notes (Signed)
Subjective:  Patient ID: Andrea Stewart, female    DOB: 1946/02/11  Age: 70 y.o. MRN: [id number]  CC: The primary encounter diagnosis was Breast cancer screening. Diagnoses of COPD (chronic obstructive pulmonary disease) with chronic bronchitis ([place]), Essential hypertension, and Tobacco abuse counseling were also pertinent to this visit.  HPI TALOR WANDLING presents for follow up on hypertension and , COPD.    She has resumed amlodipine 5mg  daily ([place]) with a fluid pill along with the one diovan (160 mg total).  Home bps have been normal and she feels better.   COPd under control   HAS REDUCED 4 CIGS DAILY .  HUSBAND CUTTING BACK as well.  Spent 3 minutes discussing risk of continued tobacco abuse, including but not limited to CAD, PAD, hypertension, and CA.  He is not interested in pharmacotherapy at this time.   Sugars are running better. Coming down after the 2 steroid injections she received.    Nocturia 2 to 3 times per night .  No dysuria,  drinks caffeine free beverages from 2 pm on     Outpatient Prescriptions Prior to Visit  Medication Sig Dispense Refill  . albuterol (PROVENTIL HFA) 108 (90 BASE) MCG/ACT inhaler Inhale 2 puffs into the lungs every 4 (four) hours as needed for wheezing or shortness of breath. 1 Inhaler 2  . amLODipine (NORVASC) 5 MG tablet Take 5 mg by mouth daily.    Marland Kitchen aspirin 81 MG tablet Take 1 tablet (81 mg total) by mouth daily.    . bisacodyl (DULCOLAX) 10 MG suppository Place 1 suppository (10 mg total) rectally as needed for moderate constipation. 12 suppository 0  . budesonide-formoterol (SYMBICORT) 160-4.5 MCG/ACT inhaler Inhale 2 puffs into the lungs 2 (two) times daily. 1 Inhaler 5  . calcium carbonate (OS-CAL) 600 MG TABS Take 600 mg by mouth daily.      . cyclobenzaprine (FLEXERIL) 10 MG tablet     . DAILY MULTIPLE VITAMINS PO Take 1 tablet by mouth daily.      . fluticasone (VERAMYST) 27.5 MCG/SPRAY nasal spray Place 2 sprays into the  nose as needed for rhinitis or allergies.     . hydrochlorothiazide (HYDRODIURIL) 25 MG tablet Take 1 tablet (25 mg total) by mouth daily. 90 tablet 3  . insulin lispro (HUMALOG) 100 UNIT/ML injection Inject into the skin as directed. Sliding scale 100-150 = 2-2.5 units 150-200 = 3-3.5 units 200-250 = 4-4.5 units 251-300 = 5-5.5 units 301-350 = 6-6.5 units 350-400 = 7-7.5 units    . insulin NPH (HUMULIN N,NOVOLIN N) 100 UNIT/ML injection Inject 20 Units into the skin daily before breakfast. 22 units at bedtime    . metFORMIN (GLUCOPHAGE-XR) 500 MG 24 hr tablet Take 500 mg by mouth 2 (two) times daily.     . methocarbamol (ROBAXIN) 500 MG tablet Take 500 mg by mouth every 6 (six) hours as needed for muscle spasms.     . Misc Natural Products (TURMERIC CURCUMIN) CAPS 900mg  capsule daily    . mometasone (NASONEX) 50 MCG/ACT nasal spray Place 2 sprays into the nose daily.      . nitroGLYCERIN (NITROSTAT) 0.4 MG SL tablet Place 1 tablet (0.4 mg total) under the tongue every 5 (five) minutes as needed. Maximum 3 tablets (Patient taking differently: Place 0.4 mg under the tongue every 5 (five) minutes as needed for chest pain. Maximum 3 tablets) 25 tablet 6  . nystatin (MYCOSTATIN) 100000 UNIT/ML suspension Take 5 mLs (500,000 Units total) by  mouth 4 (four) times daily. 180 mL 0  . sodium chloride (OCEAN) 0.65 % SOLN nasal spray Place 2 sprays into both nostrils as needed for congestion.    Marland Kitchen Spacer/Aero-Holding Chambers (AEROCHAMBER MV) inhaler Use as instructed 1 each 0  . traMADol (ULTRAM) 50 MG tablet Take 50 mg by mouth every 8 (eight) hours as needed for moderate pain or severe pain.    Marland Kitchen zolpidem (AMBIEN) 10 MG tablet Take 10 mg by mouth at bedtime as needed.      . valsartan (DIOVAN) 160 MG tablet Take 2 tablets (320 mg total) by mouth daily. (Patient taking differently: Take 160 mg by mouth at bedtime. ) 60 tablet 5  . diltiazem (CARDIZEM CD) 120 MG 24 hr capsule Take 1 capsule (120 mg total)  by mouth daily. (Patient not taking: Reported on 12/16/2015) 30 capsule 0  . guaiFENesin-codeine (CHERATUSSIN AC) 100-10 MG/5ML syrup Take 5 mLs by mouth at bedtime. 120 mL 0   No facility-administered medications prior to visit.    Review of Systems;  Patient denies headache, fevers, malaise, unintentional weight loss, skin rash, eye pain, sinus congestion and sinus pain, sore throat, dysphagia,  hemoptysis , cough, dyspnea, wheezing, chest pain, palpitations, orthopnea, edema, abdominal pain, nausea, melena, diarrhea, constipation, flank pain, dysuria, hematuria, urinary  Frequency, nocturia, numbness, tingling, seizures,  Focal weakness, Loss of consciousness,  Tremor, insomnia, depression, anxiety, and suicidal ideation.      Objective:  BP 138/76 mmHg  Pulse 65  Temp(Src) 98.3 F (36.8 C) (Oral)  Resp 12  Ht 5\' 1"  (1.549 m)  Wt 186 lb 4 oz (84.482 kg)  BMI 35.21 kg/m2  SpO2 94%  BP Readings from Last 3 Encounters:  12/16/15 138/76  12/11/15 142/78  11/13/15 138/78    Wt Readings from Last 3 Encounters:  12/16/15 186 lb 4 oz (84.482 kg)  12/11/15 183 lb (83.008 kg)  10/29/15 184 lb 2 oz (83.519 kg)    General appearance: alert, cooperative and appears stated age Ears: normal TM's and external ear canals both ears Throat: lips, mucosa, and tongue normal; teeth and gums normal Neck: no adenopathy, no carotid bruit, supple, symmetrical, trachea midline and thyroid not enlarged, symmetric, no tenderness/mass/nodules Back: symmetric, no curvature. ROM normal. No CVA tenderness. Lungs: clear to auscultation bilaterally Heart: regular rate and rhythm, S1, S2 normal, no murmur, click, rub or gallop Abdomen: soft, non-tender; bowel sounds normal; no masses,  no organomegaly Pulses: 2+ and symmetric Skin: Skin color, texture, turgor normal. No rashes or lesions Lymph nodes: Cervical, supraclavicular, and axillary nodes normal.  Lab Results  Component Value Date   HGBA1C 8.9*  01/01/2014   HGBA1C 9.1* 07/30/2011    Lab Results  Component Value Date   CREATININE 1.17* 07/19/2015   CREATININE 1.43* 05/13/2015   CREATININE 1.6* 05/05/2015    Lab Results  Component Value Date   WBC 6.6 07/19/2015   HGB 12.2 07/19/2015   HCT 36.8 07/19/2015   PLT 197 07/19/2015   GLUCOSE 195* 07/19/2015   CHOL 181 05/13/2015   TRIG 110.0 05/13/2015   HDL 66.90 05/13/2015   LDLDIRECT 51.9 09/07/2012   LDLCALC 92 05/13/2015   ALT 19 07/19/2015   AST 30 07/19/2015   NA 137 07/19/2015   K 4.2 07/19/2015   CL 103 07/19/2015   CREATININE 1.17* 07/19/2015   BUN 21* 07/19/2015   CO2 27 07/19/2015   TSH 1.35 02/25/2014   INR 1.01 03/27/2015   HGBA1C 8.9* 01/01/2014  Dg Chest 2 View  08/11/2015  CLINICAL DATA:  Routine checkup. Chest tightness. Wheezing, hypertension, diabetic, smoker. EXAM: CHEST  2 VIEW COMPARISON:  07/19/2015 FINDINGS: There is hyperinflation of the lungs compatible with COPD. Heart is upper limits normal in size. Lungs are clear. No effusions. No acute bony abnormality. IMPRESSION: COPD.  No active disease. Electronically Signed   By: Rolm Baptise M.D.   On: 08/11/2015 15:21    Assessment & Plan:   Problem List Items Addressed This Visit    Essential hypertension    Controlled on diovan 160 mg daily amlodipine 5 mg diay,  No chagnes today  Lab Results  Component Value Date   CREATININE 1.17* 07/19/2015   Lab Results  Component Value Date   NA 137 07/19/2015   K 4.2 07/19/2015   CL 103 07/19/2015   CO2 27 07/19/2015         Relevant Medications   valsartan (DIOVAN) 160 MG tablet   COPD (chronic obstructive pulmonary disease) with chronic bronchitis ([facility])    Asymptomatic currently.  Continue symbicort And veramyst.      Tobacco abuse counseling    Smoking cessation instruction/counseling given:  counseled patient on the dangers of tobacco use, advised patient to stop smoking, and reviewed strategies to maximize success         Other Visit Diagnoses    Breast cancer screening    -  Primary    Relevant Orders    MM DIGITAL SCREENING BILATERAL       I have discontinued Ms. Ertle's guaiFENesin-codeine and diltiazem. I have also changed her valsartan. Additionally, I am having her maintain her calcium carbonate, DAILY MULTIPLE VITAMINS PO, insulin lispro, fluticasone, mometasone, zolpidem, insulin NPH Human, methocarbamol, aspirin, nitroGLYCERIN, metFORMIN, sodium chloride, cyclobenzaprine, traMADol, albuterol, Turmeric Curcumin, AEROCHAMBER MV, budesonide-formoterol, bisacodyl, hydrochlorothiazide, nystatin, and amLODipine.  Meds ordered this encounter  Medications  . valsartan (DIOVAN) 160 MG tablet    Sig: Take 1 tablet (160 mg total) by mouth at bedtime.    Dispense:  90 tablet    Refill:  1    keep on file for future refills    Medications Discontinued During This Encounter  Medication Reason  . diltiazem (CARDIZEM CD) 120 MG 24 hr capsule Discontinued by provider  . guaiFENesin-codeine (CHERATUSSIN AC) 100-10 MG/5ML syrup Error  . diltiazem (CARDIZEM CD) 120 MG 24 hr capsule Discontinued by provider  . valsartan (DIOVAN) 160 MG tablet Reorder    Follow-up: Return in about 6 months (around 06/14/2016).   Crecencio Mc, MD

## 2015-12-16 NOTE — Progress Notes (Signed)
Pre-visit discussion using our clinic review tool. No additional management support is needed unless otherwise documented below in the visit note.  

## 2015-12-16 NOTE — Patient Instructions (Signed)
I AM SO PROUD OF YOU FOR REDUCING YOUR CIGARETTE USE!   i WILL SEE YOU AGAIN IN 6 MONTHS FOR YOUR  ANNUAL   I have ordered your mammogram

## 2015-12-17 DIAGNOSIS — Z716 Tobacco abuse counseling: Secondary | ICD-10-CM | POA: Insufficient documentation

## 2015-12-17 MED ORDER — VALSARTAN 160 MG PO TABS: 160.0000 mg | ORAL_TABLET | Freq: Every day | ORAL | Status: DC

## 2015-12-17 NOTE — Assessment & Plan Note (Signed)
Asymptomatic currently.  Continue symbicort And veramyst.

## 2015-12-17 NOTE — Assessment & Plan Note (Signed)
Smoking cessation instruction/counseling given:  counseled patient on the dangers of tobacco use, advised patient to stop smoking, and reviewed strategies to maximize success 

## 2015-12-17 NOTE — Assessment & Plan Note (Signed)
Controlled on diovan 160 mg daily amlodipine 5 mg diay,  No chagnes today  Lab Results  Component Value Date   CREATININE 1.17* 07/19/2015   Lab Results  Component Value Date   NA 137 07/19/2015   K 4.2 07/19/2015   CL 103 07/19/2015   CO2 27 07/19/2015

## 2015-12-23 ENCOUNTER — Telehealth: Payer: Self-pay | Admitting: Pulmonary Disease

## 2015-12-23 DIAGNOSIS — Z87891 Personal history of nicotine dependence: Secondary | ICD-10-CM

## 2015-12-23 DIAGNOSIS — J449 Chronic obstructive pulmonary disease, unspecified: Secondary | ICD-10-CM

## 2015-12-23 NOTE — Telephone Encounter (Signed)
Spoke with pt. When pt was here on 12/11/15 and was told she would have a CT done. An order was not placed. Pt requests that this be done at [facility].  BQ - please advise what type of CT you would like the pt to have. Thanks.

## 2015-12-23 NOTE — Telephone Encounter (Signed)
We need to verify that Andrea Stewart has been referred to Burgess Estelle with the [facility] lung cancer screening program. It may be that they batched their phone calls and they just have not scheduled appointments to see patients this month yet.

## 2015-12-23 NOTE — Telephone Encounter (Signed)
I do not see where pt was referred to lung cancer screening at last ov.  This referral has been placed. lmtcb X1 to make pt aware that she should expect a call from Burgess Estelle to set this up.

## 2015-12-24 ENCOUNTER — Telehealth: Payer: Self-pay | Admitting: *Deleted

## 2015-12-24 NOTE — Telephone Encounter (Signed)
Received referral for low dose lung cancer screening CT scan. Contacted patient to confirm information. Given appointment for shared decision making visit and scan on 01/02/16. This appointment will be viewable after our insurance review is complete.

## 2015-12-24 NOTE — Telephone Encounter (Signed)
Called and spoke with patient. Informed her that referral was placed. She states she already received a call this morning for [employer]. She voiced understanding and had no further questions. Nothing further needed.

## 2015-12-26 ENCOUNTER — Other Ambulatory Visit: Payer: Self-pay | Admitting: Neurology

## 2015-12-26 DIAGNOSIS — E118 Type 2 diabetes mellitus with unspecified complications: Secondary | ICD-10-CM | POA: Diagnosis not present

## 2015-12-26 DIAGNOSIS — I10 Essential (primary) hypertension: Secondary | ICD-10-CM | POA: Diagnosis not present

## 2015-12-26 DIAGNOSIS — R251 Tremor, unspecified: Secondary | ICD-10-CM | POA: Diagnosis not present

## 2015-12-26 DIAGNOSIS — Z794 Long term (current) use of insulin: Secondary | ICD-10-CM | POA: Diagnosis not present

## 2015-12-31 ENCOUNTER — Ambulatory Visit
Admission: RE | Admit: 2015-12-31 | Discharge: 2015-12-31 | Disposition: A | Discharge status: Home / Self Care | Payer: Medicare Other | Source: Ambulatory Visit | Attending: Neurology | Admitting: Neurology

## 2015-12-31 DIAGNOSIS — R251 Tremor, unspecified: Secondary | ICD-10-CM | POA: Insufficient documentation

## 2015-12-31 DIAGNOSIS — R51 Headache: Secondary | ICD-10-CM | POA: Diagnosis not present

## 2016-01-01 ENCOUNTER — Other Ambulatory Visit: Payer: Self-pay | Admitting: Internal Medicine

## 2016-01-01 ENCOUNTER — Ambulatory Visit
Admission: RE | Admit: 2016-01-01 | Discharge: 2016-01-01 | Disposition: A | Discharge status: Home / Self Care | Payer: Medicare Other | Source: Ambulatory Visit | Attending: Internal Medicine | Admitting: Internal Medicine

## 2016-01-01 ENCOUNTER — Other Ambulatory Visit: Payer: Self-pay | Admitting: Family Medicine

## 2016-01-01 DIAGNOSIS — Z1239 Encounter for other screening for malignant neoplasm of breast: Secondary | ICD-10-CM

## 2016-01-01 DIAGNOSIS — Z1231 Encounter for screening mammogram for malignant neoplasm of breast: Secondary | ICD-10-CM | POA: Insufficient documentation

## 2016-01-01 DIAGNOSIS — Z87891 Personal history of nicotine dependence: Secondary | ICD-10-CM

## 2016-01-02 ENCOUNTER — Inpatient Hospital Stay: Payer: Medicare Other | Attending: Family Medicine | Admitting: Family Medicine

## 2016-01-02 ENCOUNTER — Ambulatory Visit
Admission: RE | Admit: 2016-01-02 | Discharge: 2016-01-02 | Disposition: A | Discharge status: Home / Self Care | Payer: Medicare Other | Source: Ambulatory Visit | Attending: Family Medicine | Admitting: Family Medicine

## 2016-01-02 ENCOUNTER — Encounter: Payer: Self-pay | Admitting: Family Medicine

## 2016-01-02 DIAGNOSIS — R918 Other nonspecific abnormal finding of lung field: Secondary | ICD-10-CM | POA: Insufficient documentation

## 2016-01-02 DIAGNOSIS — I700 Atherosclerosis of aorta: Secondary | ICD-10-CM | POA: Diagnosis not present

## 2016-01-02 DIAGNOSIS — Z87891 Personal history of nicotine dependence: Secondary | ICD-10-CM | POA: Insufficient documentation

## 2016-01-02 DIAGNOSIS — I2510 Atherosclerotic heart disease of native coronary artery without angina pectoris: Secondary | ICD-10-CM | POA: Diagnosis not present

## 2016-01-02 DIAGNOSIS — Z122 Encounter for screening for malignant neoplasm of respiratory organs: Secondary | ICD-10-CM | POA: Diagnosis not present

## 2016-01-02 DIAGNOSIS — F17219 Nicotine dependence, cigarettes, with unspecified nicotine-induced disorders: Secondary | ICD-10-CM | POA: Diagnosis not present

## 2016-01-02 NOTE — Progress Notes (Signed)
In accordance with CMS guidelines, patient has meet eligibility criteria including age, absence of signs or symptoms of lung cancer, the specific calculation of cigarette smoking pack-years was 30 years and is a current smoker.   A shared decision-making session was conducted prior to the performance of CT scan. This includes one or more decision aids, includes benefits and harms of screening, follow-up diagnostic testing, over-diagnosis, false positive rate, and total radiation exposure.  Counseling on the importance of adherence to annual lung cancer LDCT screening, impact of co-morbidities, and ability or willingness to undergo diagnosis and treatment is imperative for compliance of the program.  Counseling on the importance of continued smoking cessation for former smokers; the importance of smoking cessation for current smokers and information about tobacco cessation interventions have been given to patient including the [place] [facility], 1800 quit [place], as well as Cancer Center specific smoking cessation programs.  Written order for lung cancer screening with LDCT has been given to the patient and any and all questions have been answered to the best of my abilities.   Yearly follow up will be scheduled by Shawn Perkins, Thoracic Navigator.   

## 2016-01-05 ENCOUNTER — Telehealth: Payer: Self-pay | Admitting: *Deleted

## 2016-01-05 NOTE — Telephone Encounter (Signed)
Notified patient of LDCT lung cancer screening results of Lung Rads 2S finding with recommendation for 12 month follow up imaging. Also notified of incidental finding noted below. Patient verbalizes understanding.   IMPRESSION: 1. Lung-RADS Category 2S, benign appearance or behavior. Continue annual screening with low-dose chest CT without contrast in 12 months. 2. The "S" modifier above refers to potentially clinically significant non lung cancer related findings. Specifically, there is atherosclerosis, including left main and 2 vessel coronary artery disease. Please note that although the presence of coronary artery calcium documents the presence of coronary artery disease, the severity of this disease and any potential stenosis cannot be assessed on this non-gated CT examination. Assessment for potential risk factor modification, dietary therapy or pharmacologic therapy may be warranted, if clinically indicated. 3. Similar appearing lesion in the upper middle mediastinum. This is of uncertain etiology and significance, but is presumably benign given its stability in size and appearance compared to the prior study from 11/20/2014. 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 This is strongly favored to be benign.

## 2016-01-14 ENCOUNTER — Telehealth: Payer: Self-pay | Admitting: Internal Medicine

## 2016-01-14 NOTE — Telephone Encounter (Signed)
She can increase the amlodipine (norvasc) to 10 mg daily .  Confirm that she is not using ibuprofen and aleve

## 2016-01-14 NOTE — Telephone Encounter (Signed)
Pt is calling about BP medication. Her BP is staying high. Can Dr. Derrel Nip change blood pressure medicine or should pt come in for an appt? PT only has on kidney.

## 2016-01-14 NOTE — Telephone Encounter (Signed)
Reason for call: bp running high, pt concerned about kidney damage b/c bp is not coming down Symptoms: pt states that her bp is running high, headaches, pt states the she has been recording bp's 24th 180/96, 25th 170/95, 28th 170/85, today 170/85 Duration: since Feb 24th Medications: diovan -in the morning and at night, HCTZ- in the morning, norvasc- in the morning Last seen for this problem: 12/16/15 Seen by: Dr. Derrel Nip    Does pt need to come in for appt? Please advise, thanks

## 2016-01-15 ENCOUNTER — Other Ambulatory Visit: Payer: Self-pay

## 2016-01-15 MED ORDER — AMLODIPINE BESYLATE 10 MG PO TABS: ORAL_TABLET | ORAL | Status: DC

## 2016-01-15 NOTE — Telephone Encounter (Signed)
Increased pt Norvasc, per Dr. Derrel Nip and sent over new Rx to her pharmacy

## 2016-01-15 NOTE — Telephone Encounter (Signed)
Notified pt of Dr.Tullo's comments and sent new rx to pharmacy, pt states that she is not taking aleve or ibuprofen

## 2016-02-13 ENCOUNTER — Ambulatory Visit (INDEPENDENT_AMBULATORY_CARE_PROVIDER_SITE_OTHER): Payer: Medicare Other | Admitting: Pulmonary Disease

## 2016-02-13 ENCOUNTER — Encounter: Payer: Self-pay | Admitting: Pulmonary Disease

## 2016-02-13 VITALS — BP 136/74 | HR 88 | Ht 61.0 in | Wt 183.8 lb

## 2016-02-13 DIAGNOSIS — J449 Chronic obstructive pulmonary disease, unspecified: Secondary | ICD-10-CM | POA: Diagnosis not present

## 2016-02-13 DIAGNOSIS — F17200 Nicotine dependence, unspecified, uncomplicated: Secondary | ICD-10-CM

## 2016-02-13 NOTE — Progress Notes (Signed)
Subjective:    Patient ID: Andrea Stewart, female    DOB: 1946-10-08, 70 y.o.   MRN: [id number]  Synopsis: Referred in March 2016 for evaluation of ongoing shortness of breath. She had a CT scan performed at [facility] in 2015 which demonstrated tree-in-bud abnormalities in the right upper lobe as well as a left lower lobe pulmonary nodule. A repeat CT scan was performed in January 2016 showing a right lower lobe pleural-based pulmonary nodule 1.27 m in size. This resolved on a April 2016 CT chest.  11/20/2014 CT chest> tree in bud, inflammatory appearing abnormalities in the right upper lobe, left upper lobe pleural-based 1.2 cm solid nodule 02/2015 CT chest > right upper lobe tree-in-bud abnormalities persistent, the pulmonary nodule seen on the previous study has resolved, thyroid nodule stable 01/2015 PFT> normal ratio but consistent with moderate obstruction (FEV1 1.16L (58% pred)), TLC 4.57L (99% pred), RV 133% pred, DLCO 27.6 (136% pred) 2016 Bronch> AFB bal negative  HPI Chief Complaint  Patient presents with  . Follow-up    pt doing well as long as she takes all inhaled medications.     Andrea Stewart says that she had to go to the doctor due to worsening dyspnea, wheezing, and cough.  She was treated with a steroid shot and antibiotics and she has been fine ever since.  She has been taking her Spiriva and Symbicort as prescribed.  No recent fevers or chills.   Past Medical History  Diagnosis Date  . Myalgia and myositis, unspecified   . Plantar fascial fibromatosis   . Personal history of tobacco use, presenting hazards to health     1/2 ppd  . Degeneration of intervertebral disc, site unspecified   . Backache, unspecified     s/p c-spine and l-spine fusions  . Obesity, unspecified   . Disorder of bone and cartilage, unspecified   . Unspecified hereditary and idiopathic peripheral neuropathy     secondary to diabetes  . Unspecified essential hypertension   . Type II or  unspecified type diabetes mellitus without mention of complication, not stated as uncontrolled   . Depressive disorder, not elsewhere classified   . GERD (gastroesophageal reflux disease)   . S/p nephrectomy   . S/P cholecystectomy   . Hx of left heart catheterization by cutdown     a. 7 years ago; no significant cad; b. Lexiscan 2014: no significant ischemia, no significant EKG changes concerning for ischemia, EF 67%, overall low risk study  . HTN (hypertension)       Review of Systems  Constitutional: Negative for fever, chills and fatigue.  HENT: Negative for postnasal drip, rhinorrhea and sinus pressure.   Respiratory: Positive for shortness of breath. Negative for cough and wheezing.   Cardiovascular: Negative for chest pain, palpitations and leg swelling.       Objective:   Physical Exam Filed Vitals:   02/13/16 1108  BP: 136/74  Pulse: 88  Height: 5\' 1"  (1.549 m)  Weight: 183 lb 12.8 oz (83.371 kg)  SpO2: 98%   RA  Gen: no acute distress HEENT: NCAT, EOMi, OP clear PULM: CTA B  CV: RRR, no mgr, no JVD AB: BS+, soft, nontender,  Ext: warm, no edema, no clubbing, no cyanosis Derm: no rash or skin breakdown Neuro: A&Ox4, MAEW  CXR from 07/2015 reviewed, emphysema, no infiltrate Records from her PCP reviewed where she was treated for a flare of COPD    Assessment & Plan:   Tobacco use disorder Counseled  to quit at length today.  COPD (chronic obstructive pulmonary disease) with chronic bronchitis Unfortunately, she continues to have exacerbations of her COPD. This is due to the fact that she continues to smoke cigarettes. She is taking maximal inhaled therapy but yet continues to have repeated exacerbations which right poor prognostic sign. I told her this again today as I have in many previous conversations. There may be some role to adding something like Roflumilast or azithromycin, but they're simply no point in doing either one of those if she continues to smoke  cigarettes.  Plan: Stop smoking, stop smoking, stop smoking Continue Symbicort and Spiriva She and her husband were both counseled at length to quit smoking today. Should they were provided with literature for smoking cessation classes in [place]. As above, she may have prevention of recurrent exacerbations if we added daily Roflumilast or azithromycin but there is absolutely no point in adding to their cost and risk of side effect his lung as she continues to smoke so I will not prescribe this today. Follow-up 3 months    Updated Medication List Outpatient Encounter Prescriptions as of 02/13/2016  Medication Sig  . albuterol (PROVENTIL HFA) 108 (90 BASE) MCG/ACT inhaler Inhale 2 puffs into the lungs every 4 (four) hours as needed for wheezing or shortness of breath.  Marland Kitchen amLODipine (NORVASC) 10 MG tablet Take one tablet (10mg )  daily  . aspirin 81 MG tablet Take 1 tablet (81 mg total) by mouth daily.  . bisacodyl (DULCOLAX) 10 MG suppository Place 1 suppository (10 mg total) rectally as needed for moderate constipation.  . budesonide-formoterol (SYMBICORT) 160-4.5 MCG/ACT inhaler Inhale 2 puffs into the lungs 2 (two) times daily.  . calcium carbonate (OS-CAL) 600 MG TABS Take 600 mg by mouth daily.    . cyclobenzaprine (FLEXERIL) 10 MG tablet   . DAILY MULTIPLE VITAMINS PO Take 1 tablet by mouth daily.    . fluticasone (VERAMYST) 27.5 MCG/SPRAY nasal spray Place 2 sprays into the nose as needed for rhinitis or allergies.   . hydrochlorothiazide (HYDRODIURIL) 25 MG tablet Take 1 tablet (25 mg total) by mouth daily.  . insulin lispro (HUMALOG) 100 UNIT/ML injection Inject into the skin as directed. Sliding scale 100-150 = 2-2.5 units 150-200 = 3-3.5 units 200-250 = 4-4.5 units 251-300 = 5-5.5 units 301-350 = 6-6.5 units 350-400 = 7-7.5 units  . insulin NPH (HUMULIN N,NOVOLIN N) 100 UNIT/ML injection Inject 20 Units into the skin daily before breakfast. 22 units at bedtime  . metFORMIN  (GLUCOPHAGE-XR) 500 MG 24 hr tablet Take 500 mg by mouth 2 (two) times daily.   . methocarbamol (ROBAXIN) 500 MG tablet Take 500 mg by mouth every 6 (six) hours as needed for muscle spasms.   . Misc Natural Products (TURMERIC CURCUMIN) CAPS 900mg  capsule daily  . mometasone (NASONEX) 50 MCG/ACT nasal spray Place 2 sprays into the nose daily.    . nitroGLYCERIN (NITROSTAT) 0.4 MG SL tablet Place 1 tablet (0.4 mg total) under the tongue every 5 (five) minutes as needed. Maximum 3 tablets (Patient taking differently: Place 0.4 mg under the tongue every 5 (five) minutes as needed for chest pain. Maximum 3 tablets)  . nystatin (MYCOSTATIN) 100000 UNIT/ML suspension Take 5 mLs (500,000 Units total) by mouth 4 (four) times daily.  . sodium chloride (OCEAN) 0.65 % SOLN nasal spray Place 2 sprays into both nostrils as needed for congestion.  Marland Kitchen Spacer/Aero-Holding Chambers (AEROCHAMBER MV) inhaler Use as instructed  . traMADol (ULTRAM) 50 MG tablet  Take 50 mg by mouth every 8 (eight) hours as needed for moderate pain or severe pain.  . valsartan (DIOVAN) 160 MG tablet Take 1 tablet (160 mg total) by mouth at bedtime.  Marland Kitchen zolpidem (AMBIEN) 10 MG tablet Take 10 mg by mouth at bedtime as needed.     No facility-administered encounter medications on file as of 02/13/2016.

## 2016-02-13 NOTE — Assessment & Plan Note (Signed)
Counseled to quit at length today.

## 2016-02-13 NOTE — Assessment & Plan Note (Addendum)
Unfortunately, she continues to have exacerbations of her COPD. This is due to the fact that she continues to smoke cigarettes. She is taking maximal inhaled therapy but yet continues to have repeated exacerbations which right poor prognostic sign. I told her this again today as I have in many previous conversations. There may be some role to adding something like Roflumilast or azithromycin, but they're simply no point in doing either one of those if she continues to smoke cigarettes.  Plan: Stop smoking, stop smoking, stop smoking Continue Symbicort and Spiriva She and her husband were both counseled at length to quit smoking today. Should they were provided with literature for smoking cessation classes in [place]. As above, she may have prevention of recurrent exacerbations if we added daily Roflumilast or azithromycin but there is absolutely no point in adding to their cost and risk of side effect his lung as she continues to smoke so I will not prescribe this today. Follow-up 3 months

## 2016-02-13 NOTE — Patient Instructions (Signed)
Stop smoking Continue Symbicort and Spiriva We will see you back in 3 months or sooner if needed

## 2016-02-17 DIAGNOSIS — M5116 Intervertebral disc disorders with radiculopathy, lumbar region: Secondary | ICD-10-CM | POA: Diagnosis not present

## 2016-02-17 DIAGNOSIS — M47812 Spondylosis without myelopathy or radiculopathy, cervical region: Secondary | ICD-10-CM | POA: Diagnosis not present

## 2016-02-23 ENCOUNTER — Encounter: Payer: Self-pay | Admitting: Internal Medicine

## 2016-02-23 ENCOUNTER — Ambulatory Visit (INDEPENDENT_AMBULATORY_CARE_PROVIDER_SITE_OTHER): Payer: Medicare Other | Admitting: Internal Medicine

## 2016-02-23 VITALS — BP 138/78 | HR 83 | Temp 99.1°F | Resp 16 | Ht 61.0 in | Wt 184.5 lb

## 2016-02-23 DIAGNOSIS — J441 Chronic obstructive pulmonary disease with (acute) exacerbation: Secondary | ICD-10-CM | POA: Diagnosis not present

## 2016-02-23 MED ORDER — LEVOFLOXACIN 500 MG PO TABS: 500.0000 mg | ORAL_TABLET | Freq: Every day | ORAL | Status: DC

## 2016-02-23 MED ORDER — ALBUTEROL SULFATE (2.5 MG/3ML) 0.083% IN NEBU
2.5000 mg | INHALATION_SOLUTION | Freq: Once | RESPIRATORY_TRACT | Status: AC
  Administered 2016-02-23: 2.5 mg via RESPIRATORY_TRACT

## 2016-02-23 MED ORDER — METHYLPREDNISOLONE ACETATE 40 MG/ML IJ SUSP
40.0000 mg | Freq: Once | INTRAMUSCULAR | Status: AC
  Administered 2016-02-23: 40 mg via INTRAMUSCULAR

## 2016-02-23 MED ORDER — PREDNISONE 10 MG PO TABS: ORAL_TABLET | ORAL | Status: DC

## 2016-02-23 MED ORDER — HYDROCOD POLST-CPM POLST ER 10-8 MG/5ML PO SUER: 5.0000 mL | Freq: Every evening | ORAL | Status: DC | PRN

## 2016-02-23 NOTE — Patient Instructions (Addendum)
You are having a COPD exacerbation !  If you become more short of breath than you are now,  You need to go to the nearest ER immediately or call 911  I a treating you  with the following:  Prednisone IM injection .   Start the Prednisone oral taper if your symptoms improve but return  Levaquin  daily with food  7 days (antibiotic) Continue your spiriva daily and Symbicort twice daily .  Use your albuterol inhaler every 6 hours as needed for chest tightness Take Tussionex at bedtime for cough, use Delsym for daytime cough   Please take a probiotic ( Align, Floraque or Culturelle), or  the generic version of one of these  For a minimum of 3 weeks to prevent a serious antibiotic associated diarrhea  Called clostridium dificile colitis

## 2016-02-23 NOTE — Progress Notes (Signed)
Subjective:  Patient ID: Andrea Stewart, female    DOB: [birth year]  Age: 70 y.o. MRN: [id number]  CC: The encounter diagnosis was COPD exacerbation ([place]).  HPI Andrea Stewart presents for cough,  Chest tightness,  On Friday,  Now with headache as of this morning no body aches or fevers.   Sitting up to sleep .  Using symbicort and spiriva,  Using albuterol MDI   Outpatient Prescriptions Prior to Visit  Medication Sig Dispense Refill  . albuterol (PROVENTIL HFA) 108 (90 BASE) MCG/ACT inhaler Inhale 2 puffs into the lungs every 4 (four) hours as needed for wheezing or shortness of breath. 1 Inhaler 2  . amLODipine (NORVASC) 10 MG tablet Take one tablet (10mg )  daily 30 tablet 1  . aspirin 81 MG tablet Take 1 tablet (81 mg total) by mouth daily.    . bisacodyl (DULCOLAX) 10 MG suppository Place 1 suppository (10 mg total) rectally as needed for moderate constipation. 12 suppository 0  . budesonide-formoterol (SYMBICORT) 160-4.5 MCG/ACT inhaler Inhale 2 puffs into the lungs 2 (two) times daily. 1 Inhaler 5  . calcium carbonate (OS-CAL) 600 MG TABS Take 600 mg by mouth daily.      . cyclobenzaprine (FLEXERIL) 10 MG tablet     . DAILY MULTIPLE VITAMINS PO Take 1 tablet by mouth daily.      . fluticasone (VERAMYST) 27.5 MCG/SPRAY nasal spray Place 2 sprays into the nose as needed for rhinitis or allergies.     . hydrochlorothiazide (HYDRODIURIL) 25 MG tablet Take 1 tablet (25 mg total) by mouth daily. 90 tablet 3  . insulin lispro (HUMALOG) 100 UNIT/ML injection Inject into the skin as directed. Sliding scale 100-150 = 2-2.5 units 150-200 = 3-3.5 units 200-250 = 4-4.5 units 251-300 = 5-5.5 units 301-350 = 6-6.5 units 350-400 = 7-7.5 units    . insulin NPH (HUMULIN N,NOVOLIN N) 100 UNIT/ML injection Inject 20 Units into the skin daily before breakfast. 22 units at bedtime    . metFORMIN (GLUCOPHAGE-XR) 500 MG 24 hr tablet Take 500 mg by mouth 2 (two) times daily.     . methocarbamol  (ROBAXIN) 500 MG tablet Take 500 mg by mouth every 6 (six) hours as needed for muscle spasms.     . Misc Natural Products (TURMERIC CURCUMIN) CAPS 900mg  capsule daily    . mometasone (NASONEX) 50 MCG/ACT nasal spray Place 2 sprays into the nose daily.      . nitroGLYCERIN (NITROSTAT) 0.4 MG SL tablet Place 1 tablet (0.4 mg total) under the tongue every 5 (five) minutes as needed. Maximum 3 tablets (Patient taking differently: Place 0.4 mg under the tongue every 5 (five) minutes as needed for chest pain. Maximum 3 tablets) 25 tablet 6  . nystatin (MYCOSTATIN) 100000 UNIT/ML suspension Take 5 mLs (500,000 Units total) by mouth 4 (four) times daily. 180 mL 0  . sodium chloride (OCEAN) 0.65 % SOLN nasal spray Place 2 sprays into both nostrils as needed for congestion.    Marland Kitchen Spacer/Aero-Holding Chambers (AEROCHAMBER MV) inhaler Use as instructed 1 each 0  . traMADol (ULTRAM) 50 MG tablet Take 50 mg by mouth every 8 (eight) hours as needed for moderate pain or severe pain.    . valsartan (DIOVAN) 160 MG tablet Take 1 tablet (160 mg total) by mouth at bedtime. 90 tablet 1  . zolpidem (AMBIEN) 10 MG tablet Take 10 mg by mouth at bedtime as needed.       No facility-administered medications prior  to visit.    Review of Systems;  Patient denies headache, fevers, malaise, unintentional weight loss, skin rash, eye pain, sinus congestion and sinus pain, sore throat, dysphagia,  hemoptysis , cough, dyspnea, wheezing, chest pain, palpitations, orthopnea, edema, abdominal pain, nausea, melena, diarrhea, constipation, flank pain, dysuria, hematuria, urinary  Frequency, nocturia, numbness, tingling, seizures,  Focal weakness, Loss of consciousness,  Tremor, insomnia, depression, anxiety, and suicidal ideation.      Objective:  BP 138/78 mmHg  Pulse 83  Temp(Src) 99.1 F (37.3 C) (Oral)  Resp 16  Ht 5\' 1"  (1.549 m)  Wt 184 lb 8 oz (83.689 kg)  BMI 34.88 kg/m2  SpO2 96%  BP Readings from Last 3 Encounters:   02/23/16 138/78  02/13/16 136/74  12/16/15 138/76    Wt Readings from Last 3 Encounters:  02/23/16 184 lb 8 oz (83.689 kg)  02/13/16 183 lb 12.8 oz (83.371 kg)  01/02/16 180 lb (81.647 kg)    General appearance: alert, cooperative and appears stated age Ears: normal TM's and external ear canals both ears Throat: lips, mucosa, and tongue normal; teeth and gums normal Neck: no adenopathy, no carotid bruit, supple, symmetrical, trachea midline and thyroid not enlarged, symmetric, no tenderness/mass/nodules Back: symmetric, no curvature. ROM normal. No CVA tenderness. Lungs: clear to auscultation bilaterally Heart: regular rate and rhythm, S1, S2 normal, no murmur, click, rub or gallop Abdomen: soft, non-tender; bowel sounds normal; no masses,  no organomegaly Pulses: 2+ and symmetric Skin: Skin color, texture, turgor normal. No rashes or lesions Lymph nodes: Cervical, supraclavicular, and axillary nodes normal.  Lab Results  Component Value Date   HGBA1C 8.9* 01/01/2014   HGBA1C 9.1* 07/30/2011    Lab Results  Component Value Date   CREATININE 1.17* 07/19/2015   CREATININE 1.43* 05/13/2015   CREATININE 1.6* 05/05/2015    Lab Results  Component Value Date   WBC 6.6 07/19/2015   HGB 12.2 07/19/2015   HCT 36.8 07/19/2015   PLT 197 07/19/2015   GLUCOSE 195* 07/19/2015   CHOL 181 05/13/2015   TRIG 110.0 05/13/2015   HDL 66.90 05/13/2015   LDLDIRECT 51.9 09/07/2012   LDLCALC 92 05/13/2015   ALT 19 07/19/2015   AST 30 07/19/2015   NA 137 07/19/2015   K 4.2 07/19/2015   CL 103 07/19/2015   CREATININE 1.17* 07/19/2015   BUN 21* 07/19/2015   CO2 27 07/19/2015   TSH 1.35 02/25/2014   INR 1.01 03/27/2015   HGBA1C 8.9* 01/01/2014    Ct Chest Lung Ca Screen Low Dose W/o Cm  01/05/2016  CLINICAL DATA:  70 year old female current smoker with 30 pack-year history of smoking. Lung cancer screening examination. EXAM: CT CHEST WITHOUT CONTRAST LOW-DOSE FOR LUNG CANCER  SCREENING TECHNIQUE: Multidetector CT imaging of the chest was performed following the standard protocol without IV contrast. COMPARISON:  Chest CT 11/20/2014. FINDINGS: Mediastinum/Nodes: Heart size is normal. There is no significant pericardial fluid, thickening or pericardial calcification. There is atherosclerosis of the thoracic aorta, the great vessels of the mediastinum and the coronary arteries, including calcified atherosclerotic plaque in the left main, left anterior descending and left circumflex coronary arteries. No pathologically enlarged mediastinal or hilar lymph nodes. Please note that accurate exclusion of hilar adenopathy is limited on noncontrast CT scans. Posterior to the proximal trachea and to the right side of the proximal esophagus there is again a well-circumscribed 2.0 x 2.8 cm high attenuation (76 HU) partially calcified lesion (Image 6 of series 2) which is unchanged in  size in appearance compared to the prior examination 11/20/2014. This again appears to be intimately associated with and of similar attenuation characteristics to the posterior aspect of the right lobe of the thyroid gland. This results in slight left-sided deviation of the proximal esophagus. Distal esophagus is otherwise unremarkable in appearance. No axillary lymphadenopathy. Lungs/Pleura: Several tiny pulmonary nodules are noted throughout the lungs bilaterally, the largest which is in the periphery of the inferior segment of the lingula (image 174 of series 3) with a volume derived mean diameter of only 3.9 mm. No larger more suspicious appearing pulmonary nodules or masses are otherwise noted. No acute consolidative airspace disease. No pleural effusions. Upper abdomen: Unremarkable. Musculoskeletal: There are no aggressive appearing lytic or blastic lesions noted in the visualized portions of the skeleton. IMPRESSION: 1. Lung-RADS Category 2S, benign appearance or behavior. Continue annual screening with low-dose  chest CT without contrast in 12 months. 2. The "S" modifier above refers to potentially clinically significant non lung cancer related findings. Specifically, there is atherosclerosis, including left main and 2 vessel coronary artery disease. Please note that although the presence of coronary artery calcium documents the presence of coronary artery disease, the severity of this disease and any potential stenosis cannot be assessed on this non-gated CT examination. Assessment for potential risk factor modification, dietary therapy or pharmacologic therapy may be warranted, if clinically indicated. 3. Similar appearing lesion in the upper middle mediastinum. This is of uncertain etiology and significance, but is presumably benign given its stability in size and appearance compared to the prior study from 11/20/2014. 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 This is strongly favored to be benign. Electronically Signed   By: Vinnie Langton M.D.   On: 01/05/2016 08:16    Assessment & Plan:   Problem List Items Addressed This Visit    COPD exacerbation ([place]) - Primary    Current presentation suggest viral etiology. However he has COPD.  Will treat supportively.  Steroids, antibiotics and cough suppressants. .       Relevant Medications   chlorpheniramine-HYDROcodone (TUSSIONEX PENNKINETIC ER) 10-8 MG/5ML SUER   predniSONE (DELTASONE) 10 MG tablet   albuterol (PROVENTIL) (2.5 MG/3ML) 0.083% nebulizer solution 2.5 mg (Completed)   methylPREDNISolone acetate (DEPO-MEDROL) injection 40 mg (Completed)      I am having Andrea Stewart start on chlorpheniramine-HYDROcodone, levofloxacin, and predniSONE. I am also having her maintain her calcium carbonate, DAILY MULTIPLE VITAMINS PO, insulin lispro, fluticasone, mometasone, zolpidem, insulin NPH Human, methocarbamol, aspirin, nitroGLYCERIN,  metFORMIN, sodium chloride, cyclobenzaprine, traMADol, albuterol, Turmeric Curcumin, AEROCHAMBER MV, budesonide-formoterol, bisacodyl, hydrochlorothiazide, nystatin, valsartan, and amLODipine. We administered albuterol and methylPREDNISolone acetate.  Meds ordered this encounter  Medications  . chlorpheniramine-HYDROcodone (TUSSIONEX PENNKINETIC ER) 10-8 MG/5ML SUER    Sig: Take 5 mLs by mouth at bedtime as needed for cough.    Dispense:  140 mL    Refill:  0  . levofloxacin (LEVAQUIN) 500 MG tablet    Sig: Take 1 tablet (500 mg total) by mouth daily.    Dispense:  7 tablet    Refill:  0  . predniSONE (DELTASONE) 10 MG tablet    Sig: 6 tablets on Day 1 , then reduce by 1 tablet daily until gone    Dispense:  21 tablet    Refill:  0  . albuterol (PROVENTIL) (2.5 MG/3ML) 0.083% nebulizer solution 2.5 mg    Sig:   . methylPREDNISolone acetate (  DEPO-MEDROL) injection 40 mg    Sig:     There are no discontinued medications.  Follow-up: No Follow-up on file.   Crecencio Mc, MD

## 2016-02-25 NOTE — Assessment & Plan Note (Signed)
Current presentation suggest viral etiology. However he has COPD.  Will treat supportively.  Steroids, antibiotics and cough suppressants. Marland Kitchen

## 2016-03-10 DIAGNOSIS — E7800 Pure hypercholesterolemia, unspecified: Secondary | ICD-10-CM | POA: Diagnosis not present

## 2016-03-10 DIAGNOSIS — I10 Essential (primary) hypertension: Secondary | ICD-10-CM | POA: Diagnosis not present

## 2016-03-10 DIAGNOSIS — E1165 Type 2 diabetes mellitus with hyperglycemia: Secondary | ICD-10-CM | POA: Diagnosis not present

## 2016-03-10 DIAGNOSIS — G609 Hereditary and idiopathic neuropathy, unspecified: Secondary | ICD-10-CM | POA: Diagnosis not present

## 2016-03-16 ENCOUNTER — Other Ambulatory Visit: Payer: Self-pay

## 2016-03-16 DIAGNOSIS — N183 Chronic kidney disease, stage 3 (moderate): Secondary | ICD-10-CM | POA: Diagnosis not present

## 2016-03-16 DIAGNOSIS — Z524 Kidney donor: Secondary | ICD-10-CM | POA: Diagnosis not present

## 2016-03-16 DIAGNOSIS — D510 Vitamin B12 deficiency anemia due to intrinsic factor deficiency: Secondary | ICD-10-CM | POA: Diagnosis not present

## 2016-03-16 DIAGNOSIS — R319 Hematuria, unspecified: Secondary | ICD-10-CM | POA: Diagnosis not present

## 2016-03-16 DIAGNOSIS — E1122 Type 2 diabetes mellitus with diabetic chronic kidney disease: Secondary | ICD-10-CM | POA: Diagnosis not present

## 2016-03-16 DIAGNOSIS — R809 Proteinuria, unspecified: Secondary | ICD-10-CM | POA: Diagnosis not present

## 2016-03-16 DIAGNOSIS — D631 Anemia in chronic kidney disease: Secondary | ICD-10-CM | POA: Diagnosis not present

## 2016-03-16 DIAGNOSIS — E8809 Other disorders of plasma-protein metabolism, not elsewhere classified: Secondary | ICD-10-CM | POA: Diagnosis not present

## 2016-03-16 DIAGNOSIS — G629 Polyneuropathy, unspecified: Secondary | ICD-10-CM | POA: Diagnosis not present

## 2016-03-16 DIAGNOSIS — I129 Hypertensive chronic kidney disease with stage 1 through stage 4 chronic kidney disease, or unspecified chronic kidney disease: Secondary | ICD-10-CM | POA: Diagnosis not present

## 2016-03-16 DIAGNOSIS — R768 Other specified abnormal immunological findings in serum: Secondary | ICD-10-CM | POA: Diagnosis not present

## 2016-03-16 MED ORDER — AMLODIPINE BESYLATE 10 MG PO TABS: ORAL_TABLET | ORAL | Status: DC

## 2016-03-29 LAB — HM DIABETES EYE EXAM

## 2016-03-30 DIAGNOSIS — R768 Other specified abnormal immunological findings in serum: Secondary | ICD-10-CM | POA: Diagnosis not present

## 2016-03-30 DIAGNOSIS — R319 Hematuria, unspecified: Secondary | ICD-10-CM | POA: Diagnosis not present

## 2016-03-30 DIAGNOSIS — E1122 Type 2 diabetes mellitus with diabetic chronic kidney disease: Secondary | ICD-10-CM | POA: Diagnosis not present

## 2016-03-30 DIAGNOSIS — Z524 Kidney donor: Secondary | ICD-10-CM | POA: Diagnosis not present

## 2016-03-30 DIAGNOSIS — E8809 Other disorders of plasma-protein metabolism, not elsewhere classified: Secondary | ICD-10-CM | POA: Diagnosis not present

## 2016-03-30 DIAGNOSIS — N183 Chronic kidney disease, stage 3 (moderate): Secondary | ICD-10-CM | POA: Diagnosis not present

## 2016-03-30 DIAGNOSIS — D631 Anemia in chronic kidney disease: Secondary | ICD-10-CM | POA: Diagnosis not present

## 2016-03-30 DIAGNOSIS — I10 Essential (primary) hypertension: Secondary | ICD-10-CM | POA: Diagnosis not present

## 2016-03-30 DIAGNOSIS — R809 Proteinuria, unspecified: Secondary | ICD-10-CM | POA: Diagnosis not present

## 2016-04-22 DIAGNOSIS — R251 Tremor, unspecified: Secondary | ICD-10-CM | POA: Diagnosis not present

## 2016-04-22 DIAGNOSIS — E1142 Type 2 diabetes mellitus with diabetic polyneuropathy: Secondary | ICD-10-CM | POA: Diagnosis not present

## 2016-05-21 ENCOUNTER — Encounter: Payer: Self-pay | Admitting: Pulmonary Disease

## 2016-05-21 ENCOUNTER — Ambulatory Visit (INDEPENDENT_AMBULATORY_CARE_PROVIDER_SITE_OTHER): Payer: Medicare Other | Admitting: Pulmonary Disease

## 2016-05-21 VITALS — BP 140/82 | HR 74 | Ht 61.0 in | Wt 184.6 lb

## 2016-05-21 DIAGNOSIS — J449 Chronic obstructive pulmonary disease, unspecified: Secondary | ICD-10-CM

## 2016-05-21 DIAGNOSIS — F17200 Nicotine dependence, unspecified, uncomplicated: Secondary | ICD-10-CM

## 2016-05-21 NOTE — Patient Instructions (Signed)
Quit smoking Keep taking your medicines as you're doing We will see you back in 6 months or sooner if needed

## 2016-05-21 NOTE — Progress Notes (Signed)
Subjective:    Patient ID: Andrea Stewart, female    DOB: [birth year], 70 y.o.   MRN: [id number]  Synopsis: Referred in March 2016 for evaluation of ongoing shortness of breath. She had a CT scan performed at [facility] in 2015 which demonstrated tree-in-bud abnormalities in the right upper lobe as well as a left lower lobe pulmonary nodule. A repeat CT scan was performed in January 2016 showing a right lower lobe pleural-based pulmonary nodule 1.27 m in size. This resolved on a April 2016 CT chest.  11/20/2014 CT chest> tree in bud, inflammatory appearing abnormalities in the right upper lobe, left upper lobe pleural-based 1.2 cm solid nodule 02/2015 CT chest > right upper lobe tree-in-bud abnormalities persistent, the pulmonary nodule seen on the previous study has resolved, thyroid nodule stable 01/2015 PFT> normal ratio but consistent with moderate obstruction (FEV1 1.16L (58% pred)), TLC 4.57L (99% pred), RV 133% pred, DLCO 27.6 (136% pred) 2016 Bronch> AFB bal negative  HPI Chief Complaint  Patient presents with  . Follow-up    Reports breathing has improved as long as she uses her inhalers.    Still smoking. No recent exacerbations of COPD. Breathing OK. Not exercising much. Lots of stress in her life.  Past Medical History  Diagnosis Date  . Myalgia and myositis, unspecified   . Plantar fascial fibromatosis   . Personal history of tobacco use, presenting hazards to health     1/2 ppd  . Degeneration of intervertebral disc, site unspecified   . Backache, unspecified     s/p c-spine and l-spine fusions  . Obesity, unspecified   . Disorder of bone and cartilage, unspecified   . Unspecified hereditary and idiopathic peripheral neuropathy     secondary to diabetes  . Unspecified essential hypertension   . Type II or unspecified type diabetes mellitus without mention of complication, not stated as uncontrolled   . Depressive disorder, not elsewhere classified   . GERD  (gastroesophageal reflux disease)   . S/p nephrectomy   . S/P cholecystectomy   . Hx of left heart catheterization by cutdown     a. 7 years ago; no significant cad; b. Lexiscan 2014: no significant ischemia, no significant EKG changes concerning for ischemia, EF 67%, overall low risk study  . HTN (hypertension)       Review of Systems  Constitutional: Negative for fever, chills and fatigue.  HENT: Negative for postnasal drip, rhinorrhea and sinus pressure.   Respiratory: Positive for shortness of breath. Negative for cough and wheezing.   Cardiovascular: Negative for chest pain, palpitations and leg swelling.       Objective:   Physical Exam Filed Vitals:   05/21/16 1323  BP: 140/82  Pulse: 74  Height: 5\' 1"  (1.549 m)  Weight: 184 lb 9.6 oz (83.734 kg)  SpO2: 97%   RA  Gen: no acute distress HEENT: NCAT, EOMi, OP clear PULM: CTA B  CV: RRR, no mgr, no JVD AB: BS+, soft, nontender,  Ext: warm, no edema, no clubbing, no cyanosis Derm: no rash or skin breakdown Neuro: A&Ox4, MAEW  CXR from 07/2015 reviewed, emphysema, no infiltrate Records from her PCP reviewed where she was treated for a flare of COPD    Assessment & Plan:   Tobacco use disorder Counseled at length to quit smoking in today. Advised on ways to get free nicotine patches from the state of [place] appear  COPD (chronic obstructive pulmonary disease) with chronic bronchitis Stable interval despite continued smoking.  No recent exacerbations.  Plan: Continue Symbicort Follow-up 6 months or sooner if needed    Updated Medication List Outpatient Encounter Prescriptions as of 05/21/2016  Medication Sig  . albuterol (PROVENTIL HFA) 108 (90 BASE) MCG/ACT inhaler Inhale 2 puffs into the lungs every 4 (four) hours as needed for wheezing or shortness of breath.  Marland Kitchen amLODipine (NORVASC) 10 MG tablet Take one tablet (10mg )  daily  . aspirin 81 MG tablet Take 1 tablet (81 mg total) by mouth daily.  .  bisacodyl (DULCOLAX) 10 MG suppository Place 1 suppository (10 mg total) rectally as needed for moderate constipation.  . budesonide-formoterol (SYMBICORT) 160-4.5 MCG/ACT inhaler Inhale 2 puffs into the lungs 2 (two) times daily.  . calcium carbonate (OS-CAL) 600 MG TABS Take 600 mg by mouth daily.    . cyclobenzaprine (FLEXERIL) 10 MG tablet   . DAILY MULTIPLE VITAMINS PO Take 1 tablet by mouth daily.    . fluticasone (VERAMYST) 27.5 MCG/SPRAY nasal spray Place 2 sprays into the nose as needed for rhinitis or allergies.   . hydrochlorothiazide (HYDRODIURIL) 25 MG tablet Take 1 tablet (25 mg total) by mouth daily.  . insulin lispro (HUMALOG) 100 UNIT/ML injection Inject into the skin as directed. Sliding scale 100-150 = 2-2.5 units 150-200 = 3-3.5 units 200-250 = 4-4.5 units 251-300 = 5-5.5 units 301-350 = 6-6.5 units 350-400 = 7-7.5 units  . insulin NPH (HUMULIN N,NOVOLIN N) 100 UNIT/ML injection Inject 20 Units into the skin daily before breakfast. 22 units at bedtime  . metFORMIN (GLUCOPHAGE-XR) 500 MG 24 hr tablet Take 500 mg by mouth 2 (two) times daily.   . methocarbamol (ROBAXIN) 500 MG tablet Take 500 mg by mouth every 6 (six) hours as needed for muscle spasms.   . Misc Natural Products (TURMERIC CURCUMIN) CAPS 900mg  capsule daily  . mometasone (NASONEX) 50 MCG/ACT nasal spray Place 2 sprays into the nose daily.    . nitroGLYCERIN (NITROSTAT) 0.4 MG SL tablet Place 1 tablet (0.4 mg total) under the tongue every 5 (five) minutes as needed. Maximum 3 tablets (Patient taking differently: Place 0.4 mg under the tongue every 5 (five) minutes as needed for chest pain. Maximum 3 tablets)  . sodium chloride (OCEAN) 0.65 % SOLN nasal spray Place 2 sprays into both nostrils as needed for congestion.  Marland Kitchen Spacer/Aero-Holding Chambers (AEROCHAMBER MV) inhaler Use as instructed  . traMADol (ULTRAM) 50 MG tablet Take 50 mg by mouth every 8 (eight) hours as needed for moderate pain or severe pain.  .  valsartan (DIOVAN) 160 MG tablet Take 1 tablet (160 mg total) by mouth at bedtime.  Marland Kitchen zolpidem (AMBIEN) 10 MG tablet Take 10 mg by mouth at bedtime as needed.    . [DISCONTINUED] chlorpheniramine-HYDROcodone (TUSSIONEX PENNKINETIC ER) 10-8 MG/5ML SUER Take 5 mLs by mouth at bedtime as needed for cough. (Patient not taking: Reported on 05/21/2016)  . [DISCONTINUED] levofloxacin (LEVAQUIN) 500 MG tablet Take 1 tablet (500 mg total) by mouth daily. (Patient not taking: Reported on 05/21/2016)  . [DISCONTINUED] nystatin (MYCOSTATIN) 100000 UNIT/ML suspension Take 5 mLs (500,000 Units total) by mouth 4 (four) times daily. (Patient not taking: Reported on 05/21/2016)  . [DISCONTINUED] predniSONE (DELTASONE) 10 MG tablet 6 tablets on Day 1 , then reduce by 1 tablet daily until gone (Patient not taking: Reported on 05/21/2016)   No facility-administered encounter medications on file as of 05/21/2016.

## 2016-05-21 NOTE — Assessment & Plan Note (Signed)
Stable interval despite continued smoking. No recent exacerbations.  Plan: Continue Symbicort Follow-up 6 months or sooner if needed

## 2016-05-21 NOTE — Assessment & Plan Note (Signed)
Counseled at length to quit smoking in today. Advised on ways to get free nicotine patches from the state of [place] appear

## 2016-06-15 ENCOUNTER — Other Ambulatory Visit: Payer: Self-pay

## 2016-06-15 MED ORDER — AMLODIPINE BESYLATE 10 MG PO TABS: ORAL_TABLET | ORAL | 2 refills | Status: DC

## 2016-06-16 ENCOUNTER — Ambulatory Visit (INDEPENDENT_AMBULATORY_CARE_PROVIDER_SITE_OTHER): Payer: Medicare Other | Admitting: Internal Medicine

## 2016-06-16 ENCOUNTER — Encounter: Payer: Self-pay | Admitting: Internal Medicine

## 2016-06-16 DIAGNOSIS — R609 Edema, unspecified: Secondary | ICD-10-CM | POA: Diagnosis not present

## 2016-06-16 DIAGNOSIS — N183 Chronic kidney disease, stage 3 unspecified: Secondary | ICD-10-CM

## 2016-06-16 DIAGNOSIS — E669 Obesity, unspecified: Secondary | ICD-10-CM | POA: Diagnosis not present

## 2016-06-16 DIAGNOSIS — Z87891 Personal history of nicotine dependence: Secondary | ICD-10-CM

## 2016-06-16 MED ORDER — HYDROCHLOROTHIAZIDE 25 MG PO TABS: 25.0000 mg | ORAL_TABLET | Freq: Every day | ORAL | 3 refills | Status: DC

## 2016-06-16 MED ORDER — VALSARTAN 160 MG PO TABS: 160.0000 mg | ORAL_TABLET | Freq: Every day | ORAL | 3 refills | Status: DC

## 2016-06-16 MED ORDER — FUROSEMIDE 20 MG PO TABS: ORAL_TABLET | ORAL | 3 refills | Status: DC

## 2016-06-16 MED ORDER — AMLODIPINE BESYLATE 10 MG PO TABS: ORAL_TABLET | ORAL | 3 refills | Status: DC

## 2016-06-16 NOTE — Progress Notes (Signed)
Patient ID: Andrea Stewart, female    DOB: [birth year]  Age: 70 y.o. MRN: [id number]  The patient is here for annual Medicare wellness examination and management of other chronic and acute problems.   Last one 2015   Needs 90 day refills on meds     The risk factors are reflected in the social history.  The roster of all physicians providing medical care to patient - is listed in the Snapshot section of the chart.  Activities of daily living:  The patient is 100% independent in all ADLs: dressing, toileting, feeding as well as independent mobility  Home safety : The patient has smoke detectors in the home. They wear seatbelts.  There are no firearms at home. There is no violence in the home.   There is no risks for hepatitis, STDs or HIV. There is no   history of blood transfusion. They have no travel history to infectious disease endemic areas of the world.  The patient has seen their dentist in the last six month. They have seen their eye doctor in the last year. They admit to slight hearing difficulty with regard to whispered voices and some television programs.  They have deferred audiologic testing in the last year.  They do not  have excessive sun exposure. Discussed the need for sun protection: hats, long sleeves and use of sunscreen if there is significant sun exposure.   Diet: the importance of a healthy diet is discussed. They do have a healthy diet.  The benefits of regular aerobic exercise were discussed. She walks 4 times per week ,  20 minutes.   Depression screen: there are no signs or vegative symptoms of depression- irritability, change in appetite, anhedonia, sadness/tearfullness.  Cognitive assessment: the patient manages all their financial and personal affairs and is actively engaged. They could relate day,date,year and events; recalled 2/3 objects at 3 minutes; performed clock-face test normally.  The following portions of the patient's history were reviewed and  updated as appropriate: allergies, current medications, past family history, past medical history,  past surgical history, past social history  and problem list.  Visual acuity was not assessed per patient preference since she has regular follow up with her ophthalmologist. Hearing and body mass index were assessed and reviewed.   During the course of the visit the patient was educated and counseled about appropriate screening and preventive services including : fall prevention , diabetes screening, nutrition counseling, colorectal cancer screening, and recommended immunizations.    CC: There were no encounter diagnoses.  Left knee has broken out with a pruritic papular rash which occurs annually.   Treats it with  Vinegar and an unnamed OTC ointment.   Has been recurring annually sinc eshe had a total knee replacement  6 lb weight gain Now on amlodipine  Foot exam normal  History Andrea Stewart has a past medical history of Backache, unspecified; Degeneration of intervertebral disc, site unspecified; Depressive disorder, not elsewhere classified; Disorder of bone and cartilage, unspecified; GERD (gastroesophageal reflux disease); HTN (hypertension); left heart catheterization by cutdown; Myalgia and myositis, unspecified; Obesity, unspecified; Personal history of tobacco use, presenting hazards to health; Plantar fascial fibromatosis; S/P cholecystectomy; S/p nephrectomy; Type II or unspecified type diabetes mellitus without mention of complication, not stated as uncontrolled; Unspecified essential hypertension; and Unspecified hereditary and idiopathic peripheral neuropathy.   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   Her family history includes COPD in her father; Cancer in her father and mother; Colon cancer in her mother; Coronary artery disease in her mother; Heart attack (age of onset: 51) in her brother; Hypertension in her mother; Prostate cancer in her father; Sleep apnea in her brother.She reports that she has been smoking Cigarettes.  She has a 30.00 pack-year smoking history. She has never used smokeless tobacco. She reports that she does not drink alcohol or use drugs.  Outpatient Medications Prior to Visit  Medication Sig Dispense Refill  . albuterol (PROVENTIL HFA) 108 (90 BASE) MCG/ACT inhaler Inhale 2 puffs into the lungs every 4 (four) hours as needed for wheezing or shortness of breath. 1 Inhaler 2  . amLODipine (NORVASC) 10 MG tablet Take one tablet (10mg )  daily 30 tablet 2  . aspirin 81 MG tablet Take 1 tablet (81 mg total) by mouth daily.    . bisacodyl (DULCOLAX) 10 MG suppository Place 1 suppository (10 mg total) rectally as needed for moderate constipation. 12 suppository 0  . budesonide-formoterol (SYMBICORT) 160-4.5 MCG/ACT inhaler Inhale 2 puffs into the lungs 2 (two) times daily. 1 Inhaler 5  . calcium carbonate (OS-CAL) 600 MG TABS Take 600 mg by mouth daily.      Marland Kitchen DAILY MULTIPLE VITAMINS PO Take 1 tablet by mouth daily.      . fluticasone (VERAMYST) 27.5 MCG/SPRAY nasal spray Place 2 sprays into the nose as needed for rhinitis or allergies.     . hydrochlorothiazide (HYDRODIURIL) 25 MG tablet Take 1 tablet (25 mg total) by mouth daily. 90 tablet 3  . insulin lispro (HUMALOG) 100 UNIT/ML injection Inject into the skin as directed. Sliding scale 100-150 = 2-2.5 units 150-200 = 3-3.5 units 200-250 = 4-4.5 units 251-300 = 5-5.5 units 301-350 = 6-6.5 units 350-400 = 7-7.5 units    . insulin NPH (HUMULIN N,NOVOLIN N) 100 UNIT/ML injection Inject  20 Units into the skin daily before breakfast. 22 units at bedtime    . metFORMIN (GLUCOPHAGE-XR) 500 MG 24 hr tablet Take 500 mg by mouth 2 (two) times daily.     . Misc Natural Products (TURMERIC CURCUMIN) CAPS 900mg  capsule daily    . mometasone (NASONEX) 50 MCG/ACT nasal spray Place 2 sprays into the nose daily.      . nitroGLYCERIN (NITROSTAT) 0.4 MG SL tablet Place 1 tablet (0.4 mg total) under the tongue every 5 (five) minutes as needed. Maximum 3 tablets (Patient taking differently: Place 0.4 mg under the tongue every 5 (five) minutes as needed for chest pain. Maximum 3 tablets) 25 tablet 6  . sodium chloride (OCEAN) 0.65 % SOLN nasal spray Place 2 sprays into both nostrils as needed for congestion.    Marland Kitchen Spacer/Aero-Holding Chambers (AEROCHAMBER MV) inhaler Use as instructed 1 each 0  . traMADol (ULTRAM) 50 MG tablet Take 50 mg by mouth every 8 (eight) hours as needed for moderate pain or severe pain.    . valsartan (DIOVAN) 160 MG tablet Take 1 tablet (160 mg total) by mouth at bedtime. 90 tablet 1  . zolpidem (AMBIEN) 10 MG tablet Take 10 mg by mouth at bedtime as needed.      . cyclobenzaprine (FLEXERIL) 10 MG tablet     . methocarbamol (ROBAXIN) 500 MG tablet Take 500 mg by mouth every  6 (six) hours as needed for muscle spasms.      No facility-administered medications prior to visit.     Review of Systems   Patient denies headache, fevers, malaise, unintentional weight loss, skin rash, eye pain, sinus congestion and sinus pain, sore throat, dysphagia,  hemoptysis , cough, dyspnea, wheezing, chest pain, palpitations, orthopnea, edema, abdominal pain, nausea, melena, diarrhea, constipation, flank pain, dysuria, hematuria, urinary  Frequency, nocturia, numbness, tingling, seizures,  Focal weakness, Loss of consciousness,  Tremor, insomnia, depression, anxiety, and suicidal ideation.      Objective:  BP (!) 150/70   Pulse 88   Temp 98.4 F (36.9 C) (Oral)   Ht 4\' 11"  (1.499 m)    Wt 185 lb 2 oz (84 kg)   SpO2 95%   BMI 37.39 kg/m   Physical Exam   General appearance: alert, cooperative and appears stated age Head: Normocephalic, without obvious abnormality, atraumatic Eyes: conjunctivae/corneas clear. PERRL, EOM's intact. Fundi benign. Ears: normal TM's and external ear canals both ears Nose: Nares normal. Septum midline. Mucosa normal. No drainage or sinus tenderness. Throat: lips, mucosa, and tongue normal; teeth and gums normal Neck: no adenopathy, no carotid bruit, no JVD, supple, symmetrical, trachea midline and thyroid not enlarged, symmetric, no tenderness/mass/nodules Lungs: clear to auscultation bilaterally Breasts: normal appearance, no masses or tenderness Heart: regular rate and rhythm, S1, S2 normal, no murmur, click, rub or gallop Abdomen: soft, non-tender; bowel sounds normal; no masses,  no organomegaly Extremities: extremities normal, atraumatic, no cyanosis or edema Pulses: 2+ and symmetric Skin: Skin color, texture, turgor normal. No rashes or lesions Neurologic: Alert and oriented X 3, normal strength and tone. Normal symmetric reflexes. Normal coordination and gait.     Assessment & Plan:   Problem List Items Addressed This Visit    None    Visit Diagnoses   None.     I have discontinued Ms. Bogdan's methocarbamol and cyclobenzaprine. I am also having her maintain her calcium carbonate, DAILY MULTIPLE VITAMINS PO, insulin lispro, fluticasone, mometasone, zolpidem, insulin NPH Human, aspirin, nitroGLYCERIN, metFORMIN, sodium chloride, traMADol, albuterol, Turmeric Curcumin, AEROCHAMBER MV, budesonide-formoterol, bisacodyl, hydrochlorothiazide, valsartan, and amLODipine.  No orders of the defined types were placed in this encounter.   Medications Discontinued During This Encounter  Medication Reason  . cyclobenzaprine (FLEXERIL) 10 MG tablet Patient Preference  . methocarbamol (ROBAXIN) 500 MG tablet Patient Preference     Follow-up: No Follow-up on file.   Crecencio Mc, MD

## 2016-06-16 NOTE — Patient Instructions (Addendum)
You have been retaining water which can overnight increase your weight by 2 lbs.    Take lasix once every 3 or 4 days,  Skipping the HCTZ  On those day   Your mammogram is due in February 2018.   Menopause is a normal process in which your reproductive ability comes to an end. This process happens gradually over a span of months to years, usually between the ages of 75 and 60. Menopause is complete when you have missed 12 consecutive menstrual periods. It is important to talk with your health care provider about some of the most common conditions that affect postmenopausal women, such as heart disease, cancer, and bone loss (osteoporosis). Adopting a healthy lifestyle and getting preventive care can help to promote your health and wellness. Those actions can also lower your chances of developing some of these common conditions. WHAT SHOULD I KNOW ABOUT MENOPAUSE? During menopause, you may experience a number of symptoms, such as:  Moderate-to-severe hot flashes.  Night sweats.  Decrease in sex drive.  Mood swings.  Headaches.  Tiredness.  Irritability.  Memory problems.  Insomnia. Choosing to treat or not to treat menopausal changes is an individual decision that you make with your health care provider. WHAT SHOULD I KNOW ABOUT HORMONE REPLACEMENT THERAPY AND SUPPLEMENTS? Hormone therapy products are effective for treating symptoms that are associated with menopause, such as hot flashes and night sweats. Hormone replacement carries certain risks, especially as you become older. If you are thinking about using estrogen or estrogen with progestin treatments, discuss the benefits and risks with your health care provider. WHAT SHOULD I KNOW ABOUT HEART DISEASE AND STROKE? Heart disease, heart attack, and stroke become more likely as you age. This may be due, in part, to the hormonal changes that your body experiences during menopause. These can affect how your body processes dietary  fats, triglycerides, and cholesterol. Heart attack and stroke are both medical emergencies. There are many things that you can do to help prevent heart disease and stroke:  Have your blood pressure checked at least every 1-2 years. High blood pressure causes heart disease and increases the risk of stroke.  If you are 67-24 years old, ask your health care provider if you should take aspirin to prevent a heart attack or a stroke.  Do not use any tobacco products, including cigarettes, chewing tobacco, or electronic cigarettes. If you need help quitting, ask your health care provider.  It is important to eat a healthy diet and maintain a healthy weight.  Be sure to include plenty of vegetables, fruits, low-fat dairy products, and lean protein.  Avoid eating foods that are high in solid fats, added sugars, or salt (sodium).  Get regular exercise. This is one of the most important things that you can do for your health.  Try to exercise for at least 150 minutes each week. The type of exercise that you do should increase your heart rate and make you sweat. This is known as moderate-intensity exercise.  Try to do strengthening exercises at least twice each week. Do these in addition to the moderate-intensity exercise.  Know your numbers.Ask your health care provider to check your cholesterol and your blood glucose. Continue to have your blood tested as directed by your health care provider. WHAT SHOULD I KNOW ABOUT CANCER SCREENING? There are several types of cancer. Take the following steps to reduce your risk and to catch any cancer development as early as possible. Breast Cancer  Practice breast self-awareness.  This means understanding how your breasts normally appear and feel.  It also means doing regular breast self-exams. Let your health care provider know about any changes, no matter how small.  If you are 61 or older, have a clinician do a breast exam (clinical breast exam or CBE)  every year. Depending on your age, family history, and medical history, it may be recommended that you also have a yearly breast X-ray (mammogram).  If you have a family history of breast cancer, talk with your health care provider about genetic screening.  If you are at high risk for breast cancer, talk with your health care provider about having an MRI and a mammogram every year.  Breast cancer (BRCA) gene test is recommended for women who have family members with BRCA-related cancers. Results of the assessment will determine the need for genetic counseling and BRCA1 and for BRCA2 testing. BRCA-related cancers include these types:  Breast. This occurs in males or females.  Ovarian.  Tubal. This may also be called fallopian tube cancer.  Cancer of the abdominal or pelvic lining (peritoneal cancer).  Prostate.  Pancreatic. Cervical, Uterine, and Ovarian Cancer Your health care provider may recommend that you be screened regularly for cancer of the pelvic organs. These include your ovaries, uterus, and vagina. This screening involves a pelvic exam, which includes checking for microscopic changes to the surface of your cervix (Pap test).  For women ages 21-65, health care providers may recommend a pelvic exam and a Pap test every three years. For women ages 28-65, they may recommend the Pap test and pelvic exam, combined with testing for human papilloma virus (HPV), every five years. Some types of HPV increase your risk of cervical cancer. Testing for HPV may also be done on women of any age who have unclear Pap test results.  Other health care providers may not recommend any screening for nonpregnant women who are considered low risk for pelvic cancer and have no symptoms. Ask your health care provider if a screening pelvic exam is right for you.  If you have had past treatment for cervical cancer or a condition that could lead to cancer, you need Pap tests and screening for cancer for at  least 20 years after your treatment. If Pap tests have been discontinued for you, your risk factors (such as having a new sexual partner) need to be reassessed to determine if you should start having screenings again. Some women have medical problems that increase the chance of getting cervical cancer. In these cases, your health care provider may recommend that you have screening and Pap tests more often.  If you have a family history of uterine cancer or ovarian cancer, talk with your health care provider about genetic screening.  If you have vaginal bleeding after reaching menopause, tell your health care provider.  There are currently no reliable tests available to screen for ovarian cancer. Lung Cancer Lung cancer screening is recommended for adults 37-86 years old who are at high risk for lung cancer because of a history of smoking. A yearly low-dose CT scan of the lungs is recommended if you:  Currently smoke.  Have a history of at least 30 pack-years of smoking and you currently smoke or have quit within the past 15 years. A pack-year is smoking an average of one pack of cigarettes per day for one year. Yearly screening should:  Continue until it has been 15 years since you quit.  Stop if you develop a health problem  that would prevent you from having lung cancer treatment. Colorectal Cancer  This type of cancer can be detected and can often be prevented.  Routine colorectal cancer screening usually begins at age 26 and continues through age 31.  If you have risk factors for colon cancer, your health care provider may recommend that you be screened at an earlier age.  If you have a family history of colorectal cancer, talk with your health care provider about genetic screening.  Your health care provider may also recommend using home test kits to check for hidden blood in your stool.  A small camera at the end of a tube can be used to examine your colon directly (sigmoidoscopy  or colonoscopy). This is done to check for the earliest forms of colorectal cancer.  Direct examination of the colon should be repeated every 5-10 years until age 70. However, if early forms of precancerous polyps or small growths are found or if you have a family history or genetic risk for colorectal cancer, you may need to be screened more often. Skin Cancer  Check your skin from head to toe regularly.  Monitor any moles. Be sure to tell your health care provider:  About any new moles or changes in moles, especially if there is a change in a mole's shape or color.  If you have a mole that is larger than the size of a pencil eraser.  If any of your family members has a history of skin cancer, especially at a young age, talk with your health care provider about genetic screening.  Always use sunscreen. Apply sunscreen liberally and repeatedly throughout the day.  Whenever you are outside, protect yourself by wearing long sleeves, pants, a wide-brimmed hat, and sunglasses. WHAT SHOULD I KNOW ABOUT OSTEOPOROSIS? Osteoporosis is a condition in which bone destruction happens more quickly than new bone creation. After menopause, you may be at an increased risk for osteoporosis. To help prevent osteoporosis or the bone fractures that can happen because of osteoporosis, the following is recommended:  If you are 48-107 years old, get at least 1,000 mg of calcium and at least 600 mg of vitamin D per day.  If you are older than age 24 but younger than age 74, get at least 1,200 mg of calcium and at least 600 mg of vitamin D per day.  If you are older than age 51, get at least 1,200 mg of calcium and at least 800 mg of vitamin D per day. Smoking and excessive alcohol intake increase the risk of osteoporosis. Eat foods that are rich in calcium and vitamin D, and do weight-bearing exercises several times each week as directed by your health care provider. WHAT SHOULD I KNOW ABOUT HOW MENOPAUSE AFFECTS  Lane? Depression may occur at any age, but it is more common as you become older. Common symptoms of depression include:  Low or sad mood.  Changes in sleep patterns.  Changes in appetite or eating patterns.  Feeling an overall lack of motivation or enjoyment of activities that you previously enjoyed.  Frequent crying spells. Talk with your health care provider if you think that you are experiencing depression. WHAT SHOULD I KNOW ABOUT IMMUNIZATIONS? It is important that you get and maintain your immunizations. These include:  Tetanus, diphtheria, and pertussis (Tdap) booster vaccine.  Influenza every year before the flu season begins.  Pneumonia vaccine.  Shingles vaccine. Your health care provider may also recommend other immunizations.   This information is  not intended to replace advice given to you by your health care provider. Make sure you discuss any questions you have with your health care provider.   Document Released: 12/24/2005 Document Revised: 11/22/2014 Document Reviewed: 07/04/2014 Elsevier Interactive Patient Education [employer].

## 2016-06-19 ENCOUNTER — Encounter: Payer: Self-pay | Admitting: Internal Medicine

## 2016-06-19 DIAGNOSIS — R609 Edema, unspecified: Secondary | ICD-10-CM | POA: Insufficient documentation

## 2016-06-19 NOTE — Assessment & Plan Note (Signed)
Smoking cessation instruction/counseling given:  counseled patient on the dangers of tobacco use, advised patient to stop smoking, and reviewed strategies to maximize success 

## 2016-06-19 NOTE — Assessment & Plan Note (Signed)
Body mass index is 37.39 kg/m. I have addressed  BMI and recommended a low glycemic index diet utilizing smaller more frequent meals to increase metabolism.  I have also recommended that patient start exercising with a goal of 30 minutes of aerobic exercise a minimum of 5 days per week. Screening for lipid disorders, thyroid and diabetes to be done today.

## 2016-06-19 NOTE — Assessment & Plan Note (Signed)
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  She is now seeing DR Rolly Salter,  nephrology for surveillance.

## 2016-06-19 NOTE — Assessment & Plan Note (Signed)
Secondary to use of amlodipine.  Advised to use 20 mg lasix every 3 days,  Substituting for daily hctz on those  Days

## 2016-06-22 DIAGNOSIS — I129 Hypertensive chronic kidney disease with stage 1 through stage 4 chronic kidney disease, or unspecified chronic kidney disease: Secondary | ICD-10-CM | POA: Diagnosis not present

## 2016-06-22 DIAGNOSIS — N183 Chronic kidney disease, stage 3 (moderate): Secondary | ICD-10-CM | POA: Diagnosis not present

## 2016-06-22 DIAGNOSIS — E1122 Type 2 diabetes mellitus with diabetic chronic kidney disease: Secondary | ICD-10-CM | POA: Diagnosis not present

## 2016-06-22 DIAGNOSIS — R809 Proteinuria, unspecified: Secondary | ICD-10-CM | POA: Diagnosis not present

## 2016-07-15 DIAGNOSIS — E113293 Type 2 diabetes mellitus with mild nonproliferative diabetic retinopathy without macular edema, bilateral: Secondary | ICD-10-CM | POA: Diagnosis not present

## 2016-07-16 ENCOUNTER — Other Ambulatory Visit: Payer: Self-pay

## 2016-09-01 ENCOUNTER — Ambulatory Visit (INDEPENDENT_AMBULATORY_CARE_PROVIDER_SITE_OTHER): Payer: Medicare Other | Admitting: Family Medicine

## 2016-09-01 ENCOUNTER — Encounter: Payer: Self-pay | Admitting: Family Medicine

## 2016-09-01 VITALS — BP 124/79 | HR 89 | Temp 99.1°F | Wt 189.0 lb

## 2016-09-01 DIAGNOSIS — J441 Chronic obstructive pulmonary disease with (acute) exacerbation: Secondary | ICD-10-CM

## 2016-09-01 MED ORDER — DOXYCYCLINE HYCLATE 100 MG PO TABS: 100.0000 mg | ORAL_TABLET | Freq: Two times a day (BID) | ORAL | 0 refills | Status: DC

## 2016-09-01 MED ORDER — METHYLPREDNISOLONE ACETATE 40 MG/ML IJ SUSP
40.0000 mg | Freq: Once | INTRAMUSCULAR | Status: AC
Start: 2016-09-01 — End: 2016-09-01
  Administered 2016-09-01: 40 mg via INTRAMUSCULAR

## 2016-09-01 MED ORDER — HYDROCOD POLST-CPM POLST ER 10-8 MG/5ML PO SUER: 5.0000 mL | Freq: Two times a day (BID) | ORAL | 0 refills | Status: DC | PRN

## 2016-09-01 NOTE — Progress Notes (Signed)
Pre visit review using our clinic review tool, if applicable. No additional management support is needed unless otherwise documented below in the visit note. 

## 2016-09-01 NOTE — Patient Instructions (Signed)
Take the antibiotic as prescribed.  Use the cough medication as needed.  Take care  Dr. Lacinda Axon

## 2016-09-01 NOTE — Assessment & Plan Note (Signed)
History and exam consistent with COPD exacerbation. Treating with Doxycycline, Tussionex, and IM Depo medrol (cannot tolerate oral steroids).

## 2016-09-01 NOTE — Progress Notes (Signed)
Subjective:  Patient ID: Andrea Stewart, female    DOB: 1946/03/02  Age: 70 y.o. MRN: [id number]  CC: Cough, congestion  HPI:  70 year old female with a complicated past medical history including CAD, COPD, DM 2, hypertension presents with the above complaints.  Patient reports a one-week history of cough and chest congestion. Her husband is also sick. She states that her cough is productive of discolored sputum. She has known COPD. She is followed by pulmonology. She endorses compliance with her inhalers. No known relieving factors. No associated fevers or chills. No other associated symptoms. No other complaints this time.  Social Hx   Social History   Social History  . Marital status: Married    Spouse name: N/A  . Number of children: 4  . Years of education: N/A   Occupational History  . Retired    Social History Main Topics  . Smoking status: Current Every Day Smoker    Packs/day: 1.00    Years: 30.00    Types: Cigarettes  . Smokeless tobacco: Never Used     Comment: currently smoking .25 ppd, trying to quit 02/13/16  . Alcohol use No  . Drug use: No  . Sexual activity: Not Asked   Other Topics Concern  . None   Social History Narrative   Lives in [place]. Disability because of back   Review of Systems  Constitutional: Negative for fever.  HENT: Positive for congestion.   Respiratory: Positive for cough and shortness of breath.    Objective:  BP 124/79 (BP Location: Left Arm, Patient Position: Sitting, Cuff Size: Large)   Pulse 89   Temp 99.1 F (37.3 C) (Oral)   Wt 189 lb (85.7 kg)   SpO2 97%   BMI 38.17 kg/m   BP/Weight 09/01/2016 12/24/7679 11/19/7260  Systolic BP 035 597 416  Diastolic BP 79 70 82  Wt. (Lbs) 189 185.13 184.6  BMI 38.17 37.39 34.9   Physical Exam  Constitutional: She is oriented to person, place, and time.  Chronically ill-appearing female in no acute distress.  Cardiovascular: Normal rate and regular rhythm.     Pulmonary/Chest: Effort normal.  Scattered wheezing.  Neurological: She is alert and oriented to person, place, and time.  Psychiatric: She has a normal mood and affect.  Vitals reviewed.  Lab Results  Component Value Date   WBC 6.6 07/19/2015   HGB 12.2 07/19/2015   HCT 36.8 07/19/2015   PLT 197 07/19/2015   GLUCOSE 195 (H) 07/19/2015   CHOL 181 05/13/2015   TRIG 110.0 05/13/2015   HDL 66.90 05/13/2015   LDLDIRECT 51.9 09/07/2012   LDLCALC 92 05/13/2015   ALT 19 07/19/2015   AST 30 07/19/2015   NA 137 07/19/2015   K 4.2 07/19/2015   CL 103 07/19/2015   CREATININE 1.17 (H) 07/19/2015   BUN 21 (H) 07/19/2015   CO2 27 07/19/2015   TSH 1.35 02/25/2014   INR 1.01 03/27/2015   HGBA1C 8.9 (A) 01/01/2014    Assessment & Plan:   Problem List Items Addressed This Visit    COPD exacerbation ([place]) - Primary    History and exam consistent with COPD exacerbation. Treating with Doxycycline, Tussionex, and IM Depo medrol (cannot tolerate oral steroids).      Relevant Medications   chlorpheniramine-HYDROcodone (TUSSIONEX PENNKINETIC ER) 10-8 MG/5ML SUER   methylPREDNISolone acetate (DEPO-MEDROL) injection 40 mg (Completed)    Other Visit Diagnoses   None.    Meds ordered this encounter  Medications  .  doxycycline (VIBRA-TABS) 100 MG tablet    Sig: Take 1 tablet (100 mg total) by mouth 2 (two) times daily.    Dispense:  20 tablet    Refill:  0  . chlorpheniramine-HYDROcodone (TUSSIONEX PENNKINETIC ER) 10-8 MG/5ML SUER    Sig: Take 5 mLs by mouth every 12 (twelve) hours as needed.    Dispense:  115 mL    Refill:  0  . methylPREDNISolone acetate (DEPO-MEDROL) injection 40 mg    Follow-up: PRN  [place]

## 2016-09-16 ENCOUNTER — Ambulatory Visit (INDEPENDENT_AMBULATORY_CARE_PROVIDER_SITE_OTHER): Payer: Medicare Other | Admitting: Family

## 2016-09-16 ENCOUNTER — Ambulatory Visit (INDEPENDENT_AMBULATORY_CARE_PROVIDER_SITE_OTHER): Payer: Medicare Other

## 2016-09-16 ENCOUNTER — Encounter: Payer: Self-pay | Admitting: Family

## 2016-09-16 VITALS — BP 155/82 | HR 80 | Temp 98.3°F | Ht 59.0 in | Wt 188.6 lb

## 2016-09-16 DIAGNOSIS — I10 Essential (primary) hypertension: Secondary | ICD-10-CM | POA: Diagnosis not present

## 2016-09-16 DIAGNOSIS — J209 Acute bronchitis, unspecified: Secondary | ICD-10-CM

## 2016-09-16 DIAGNOSIS — J029 Acute pharyngitis, unspecified: Secondary | ICD-10-CM

## 2016-09-16 DIAGNOSIS — J440 Chronic obstructive pulmonary disease with acute lower respiratory infection: Secondary | ICD-10-CM

## 2016-09-16 DIAGNOSIS — R05 Cough: Secondary | ICD-10-CM | POA: Diagnosis not present

## 2016-09-16 LAB — POCT RAPID STREP A (OFFICE): RAPID STREP A SCREEN: NEGATIVE

## 2016-09-16 MED ORDER — LIDOCAINE VISCOUS 2 % MT SOLN: 15.0000 mL | OROMUCOSAL | 0 refills | Status: DC | PRN

## 2016-09-16 NOTE — Progress Notes (Signed)
Pre visit review using our clinic review tool, if applicable. No additional management support is needed unless otherwise documented below in the visit note. 

## 2016-09-16 NOTE — Patient Instructions (Addendum)
As discussed,Suspect viral bronchitis as long xray clear.  trial of lidocaine for sore throat. Neg strep.   Chest Xray.  Let me know if you are not better.   Please monitor BP and call us with reading from home.   Increase intake of clear fluids. Congestion is best treated by hydration, when mucus is wetter, it is thinner, less sticky, and easier to expel from the body, either through coughing up drainage, or by blowing your nose.   Get plenty of rest.   Use saline nasal drops and blow your nose frequently. Run a humidifier at night and elevate the head of the bed. Vicks Vapor rub will help with congestion and cough. Steam showers and sinus massage for congestion.   Use Acetaminophen or Ibuprofen as needed for fever or pain. Avoid second hand smoke. Even the smallest exposure will worsen symptoms.   Over the counter medications you can try include Delsym for cough, a decongestant for congestion, and Mucinex or Robitussin as an expectorant. Be sure to just get the plain Mucinex or Robitussin that just has one medication (Guaifenesen). We don't recommend the combination products. Note, be sure to drink two glasses of water with each dose of Mucinex as the medication will not work well without adequate hydration.   You can also try a teaspoon of honey to see if this will help reduce cough. Throat lozenges can sometimes be beneficial as well.    This illness will typically last 7 - 10 days.   Please follow up with our clinic if you develop a fever greater than 101 F, symptoms worsen, or do not resolve in the next week.

## 2016-09-16 NOTE — Assessment & Plan Note (Signed)
Elevated. No signs of symptoms of HTN urgency or emergency at this time. HA mild.Funduscopic exam normal. Patient will take BP at home and call with result. Educated goal is < 140/90.

## 2016-09-16 NOTE — Assessment & Plan Note (Addendum)
SaO2 95. Afebrile. No adventitous lung sounds.  Recent doxcycline. Patient and I agreed on conservative therapy and delayed second round of antibiotics at this time. Pending CXR. Working diagnosis of post viral cough. Return precautions.

## 2016-09-16 NOTE — Progress Notes (Signed)
Subjective:    Patient ID: Andrea Stewart, female    DOB: [birth year], 70 y.o.   MRN: [id number]  CC: JHADA RISK is a 70 y.o. female who presents today for an acute visit.    HPI: Patient here for acute visit for revaluation of cough for past 3 weeks and new complaint of sore throat, hoarseness for past couple of days. Cough unchanged. 'Having more scratchy throat.'  Has been taking care of her grandson who has strep throat.  Seen 10/18 for COPD exacerbation in our office given IM methylrednisone, doxycycline PO and cough syrup with some relief. Occasional wheezing, sinus pressure, bilateral ear pressire. No fever, SOB.  History of COPD, smoker.Has been symbicort and has not had to use rescue inhaler.   HTN- Elevated. Has mild HA. 'Thinks (Blood pressure) is high because I feel so bad.' Denies exertional chest pain or pressure, numbness or tingling radiating to left arm or jaw, palpitations, dizziness, frequent headaches, changes in vision, or shortness of breath.      HISTORY:  Past Medical History:  Diagnosis Date  . Backache, unspecified    s/p c-spine and l-spine fusions  . Degeneration of intervertebral disc, site unspecified   . Depressive disorder, not elsewhere classified   . Disorder of bone and cartilage, unspecified   . GERD (gastroesophageal reflux disease)   . HTN (hypertension)   . Hx of left heart catheterization by cutdown    a. 7 years ago; no significant cad; b. Lexiscan 2014: no significant ischemia, no significant EKG changes concerning for ischemia, EF 67%, overall low risk study  . Myalgia and myositis, unspecified   . Obesity, unspecified   . Personal history of tobacco use, presenting hazards to health    1/2 ppd  . Plantar fascial fibromatosis   . S/P cholecystectomy   . S/p nephrectomy   . Type II or unspecified type diabetes mellitus without mention of complication, not stated as uncontrolled   . Unspecified essential hypertension   .  Unspecified hereditary and idiopathic peripheral neuropathy    secondary to diabetes   Past Surgical History:  Procedure Laterality Date  . ABDOMINAL HYSTERECTOMY    . APPENDECTOMY    . BACK SURGERY     2006  . CARDIAC CATHETERIZATION     o.k. 2003  . CARPAL TUNNEL RELEASE    . CERVICAL DISCECTOMY    . CHOLECYSTECTOMY    . COLONOSCOPY  09/1999   colonoscopy-hemorrhoids, re-check 5 years (10/2006)  . dexa     osteopenia (01/2004)  . ELBOW SURGERY    . FOOT SURGERY     x 3  . KIDNEY DONATION  1991  . LUMBAR FUSION    . NECK SURGERY     06/2005  . Problem with general Anesthesia     02/2006  . REPLACEMENT TOTAL KNEE  10/2004  . trigger finger surgery    . VESICOVAGINAL FISTULA CLOSURE W/ TAH    . VIDEO BRONCHOSCOPY Bilateral 04/02/2015   Procedure: VIDEO BRONCHOSCOPY WITHOUT FLUORO;  Surgeon: Juanito Doom, MD;  Location: [facility];  Service: Cardiopulmonary;  Laterality: Bilateral;   Family History  Problem Relation Age of Onset  . Hypertension Mother   . Colon cancer Mother   . Cancer Mother     colon  . Coronary artery disease Mother   . Prostate cancer Father   . COPD Father   . Cancer Father     prostate  . Heart attack Brother 70  .  Sleep apnea Brother     Allergies: Morphine; Prednisone; Gabapentin; Nsaids; and Penicillins Current Outpatient Prescriptions on File Prior to Visit  Medication Sig Dispense Refill  . albuterol (PROVENTIL HFA) 108 (90 BASE) MCG/ACT inhaler Inhale 2 puffs into the lungs every 4 (four) hours as needed for wheezing or shortness of breath. 1 Inhaler 2  . amLODipine (NORVASC) 10 MG tablet Take one tablet (10mg )  daily 90 tablet 3  . aspirin 81 MG tablet Take 1 tablet (81 mg total) by mouth daily.    . bisacodyl (DULCOLAX) 10 MG suppository Place 1 suppository (10 mg total) rectally as needed for moderate constipation. 12 suppository 0  . budesonide-formoterol (SYMBICORT) 160-4.5 MCG/ACT inhaler Inhale 2 puffs into the lungs 2  (two) times daily. 1 Inhaler 5  . calcium carbonate (OS-CAL) 600 MG TABS Take 600 mg by mouth daily.      . chlorpheniramine-HYDROcodone (TUSSIONEX PENNKINETIC ER) 10-8 MG/5ML SUER Take 5 mLs by mouth every 12 (twelve) hours as needed. 115 mL 0  . DAILY MULTIPLE VITAMINS PO Take 1 tablet by mouth daily.      Marland Kitchen doxycycline (VIBRA-TABS) 100 MG tablet Take 1 tablet (100 mg total) by mouth 2 (two) times daily. 20 tablet 0  . fluticasone (VERAMYST) 27.5 MCG/SPRAY nasal spray Place 2 sprays into the nose as needed for rhinitis or allergies.     . furosemide (LASIX) 20 MG tablet One tablet twice weekly for fluid retention 30 tablet 3  . hydrochlorothiazide (HYDRODIURIL) 25 MG tablet Take 1 tablet (25 mg total) by mouth daily. 90 tablet 3  . insulin lispro (HUMALOG) 100 UNIT/ML injection Inject into the skin as directed. Sliding scale 100-150 = 2-2.5 units 150-200 = 3-3.5 units 200-250 = 4-4.5 units 251-300 = 5-5.5 units 301-350 = 6-6.5 units 350-400 = 7-7.5 units    . insulin NPH (HUMULIN N,NOVOLIN N) 100 UNIT/ML injection Inject 20 Units into the skin daily before breakfast. 22 units at bedtime    . metFORMIN (GLUCOPHAGE-XR) 500 MG 24 hr tablet Take 500 mg by mouth 2 (two) times daily.     . Misc Natural Products (TURMERIC CURCUMIN) CAPS 900mg  capsule daily    . mometasone (NASONEX) 50 MCG/ACT nasal spray Place 2 sprays into the nose daily.      . nitroGLYCERIN (NITROSTAT) 0.4 MG SL tablet Place 1 tablet (0.4 mg total) under the tongue every 5 (five) minutes as needed. Maximum 3 tablets (Patient taking differently: Place 0.4 mg under the tongue every 5 (five) minutes as needed for chest pain. Maximum 3 tablets) 25 tablet 6  . sodium chloride (OCEAN) 0.65 % SOLN nasal spray Place 2 sprays into both nostrils as needed for congestion.    Marland Kitchen Spacer/Aero-Holding Chambers (AEROCHAMBER MV) inhaler Use as instructed 1 each 0  . traMADol (ULTRAM) 50 MG tablet Take 50 mg by mouth every 8 (eight) hours as needed  for moderate pain or severe pain.    . valsartan (DIOVAN) 160 MG tablet Take 1 tablet (160 mg total) by mouth at bedtime. (Patient taking differently: Take 160 mg by mouth 2 (two) times daily. ) 90 tablet 3  . zolpidem (AMBIEN) 10 MG tablet Take 10 mg by mouth at bedtime as needed.      . [DISCONTINUED] valsartan-hydrochlorothiazide (DIOVAN-HCT) 320-25 MG per tablet TAKE ONE (1) TABLET EACH DAY 30 tablet 6   No current facility-administered medications on file prior to visit.     Social History  Substance Use Topics  . Smoking status:  Current Every Day Smoker    Packs/day: 1.00    Years: 30.00    Types: Cigarettes  . Smokeless tobacco: Never Used     Comment: currently smoking .25 ppd, trying to quit 02/13/16  . Alcohol use No    Review of Systems  Constitutional: Negative for chills and fever.  HENT: Positive for sore throat and voice change. Negative for congestion.   Respiratory: Positive for cough and wheezing.   Cardiovascular: Negative for chest pain and palpitations.  Gastrointestinal: Negative for nausea and vomiting.  Neurological: Positive for headaches (mild).      Objective:    BP (!) 155/82   Pulse 80   Temp 98.3 F (36.8 C) (Oral)   Ht 4\' 11"  (1.499 m)   Wt 188 lb 9.6 oz (85.5 kg)   SpO2 95%   BMI 38.09 kg/m    Physical Exam  Constitutional: She appears well-developed and well-nourished.  HENT:  Head: Normocephalic and atraumatic.  Right Ear: Hearing, tympanic membrane, external ear and ear canal normal. No drainage, swelling or tenderness. No foreign bodies. Tympanic membrane is not erythematous and not bulging. No middle ear effusion. No decreased hearing is noted.  Left Ear: Hearing, tympanic membrane, external ear and ear canal normal. No drainage, swelling or tenderness. No foreign bodies. Tympanic membrane is not erythematous and not bulging.  No middle ear effusion. No decreased hearing is noted.  Nose: Nose normal. No rhinorrhea. Right sinus  exhibits no maxillary sinus tenderness and no frontal sinus tenderness. Left sinus exhibits no maxillary sinus tenderness and no frontal sinus tenderness.  Mouth/Throat: Uvula is midline, oropharynx is clear and moist and mucous membranes are normal. No oropharyngeal exudate, posterior oropharyngeal edema, posterior oropharyngeal erythema or tonsillar abscesses.  Eyes: Conjunctivae, EOM and lids are normal. Pupils are equal, round, and reactive to light. Lids are everted and swept, no foreign bodies found.  Normal fundus bilaterally   Cardiovascular: Normal rate, regular rhythm, normal heart sounds and normal pulses.   Pulmonary/Chest: Effort normal and breath sounds normal. She has no wheezes. She has no rhonchi. She has no rales.  Lymphadenopathy:       Head (right side): No submental, no submandibular, no tonsillar, no preauricular, no posterior auricular and no occipital adenopathy present.       Head (left side): No submental, no submandibular, no tonsillar, no preauricular, no posterior auricular and no occipital adenopathy present.    She has no cervical adenopathy.       Right cervical: No superficial cervical, no deep cervical and no posterior cervical adenopathy present.      Left cervical: No superficial cervical, no deep cervical and no posterior cervical adenopathy present.  Neurological: She is alert. She has normal strength. No cranial nerve deficit or sensory deficit. She displays a negative Romberg sign.  Reflex Scores:      Bicep reflexes are 2+ on the right side and 2+ on the left side.      Patellar reflexes are 2+ on the right side and 2+ on the left side. Grip equal and strong bilateral upper extremities. Gait strong and steady. Able to perform  finger-to-nose without difficulty.   Skin: Skin is warm and dry.  Psychiatric: She has a normal mood and affect. Her speech is normal and behavior is normal. Thought content normal.  Vitals reviewed.      Assessment & Plan:    Problem List Items Addressed This Visit      Cardiovascular and Mediastinum  Essential hypertension    Elevated. No signs of symptoms of HTN urgency or emergency at this time. HA mild.Funduscopic exam normal. Patient will take BP at home and call with result. Educated goal is < 140/90.         Respiratory   Acute bronchitis    SaO2 95. Afebrile. No adventitous lung sounds.  Recent doxcycline. Patient and I agreed on conservative therapy and delayed second round of antibiotics at this time. Pending CXR. Working diagnosis of post viral cough. Return precautions.       Relevant Orders   DG Chest 2 View (Completed)     Other   Sore throat - Primary    Strep Negative. PCN allergic. Recent doxcycline. Patient and I agreed on conservative therapy and delayed second round of antibiotics at this time. Return precautions.       Relevant Medications   lidocaine (XYLOCAINE) 2 % solution   Other Relevant Orders   POCT rapid strep A (Completed)    Other Visit Diagnoses   None.       I am having Ms. Mchaney start on lidocaine. I am also having her maintain her calcium carbonate, DAILY MULTIPLE VITAMINS PO, insulin lispro, fluticasone, mometasone, zolpidem, insulin NPH Human, aspirin, nitroGLYCERIN, metFORMIN, sodium chloride, traMADol, albuterol, Turmeric Curcumin, AEROCHAMBER MV, budesonide-formoterol, bisacodyl, amLODipine, hydrochlorothiazide, valsartan, furosemide, doxycycline, and chlorpheniramine-HYDROcodone.   Meds ordered this encounter  Medications  . lidocaine (XYLOCAINE) 2 % solution    Sig: Use as directed 15 mLs in the mouth or throat every 3 (three) hours as needed for mouth pain (gargle; may spit or swallow).    Dispense:  100 mL    Refill:  0    Order Specific Question:   Supervising Provider    Answer:   Crecencio Mc [[id number]]    Return precautions given.   Risks, benefits, and alternatives of the medications and treatment plan prescribed today were discussed,  and patient expressed understanding.   Education regarding symptom management and diagnosis given to patient on AVS.  Continue to follow with TULLO, Aris Everts, MD for routine health maintenance.   Andrea Stewart and I agreed with plan.   Mable Paris, FNP

## 2016-09-16 NOTE — Assessment & Plan Note (Signed)
Strep Negative. PCN allergic. Recent doxcycline. Patient and I agreed on conservative therapy and delayed second round of antibiotics at this time. Return precautions.

## 2016-09-20 DIAGNOSIS — E1165 Type 2 diabetes mellitus with hyperglycemia: Secondary | ICD-10-CM | POA: Diagnosis not present

## 2016-09-20 DIAGNOSIS — R809 Proteinuria, unspecified: Secondary | ICD-10-CM | POA: Diagnosis not present

## 2016-09-20 DIAGNOSIS — G609 Hereditary and idiopathic neuropathy, unspecified: Secondary | ICD-10-CM | POA: Diagnosis not present

## 2016-09-20 DIAGNOSIS — Z23 Encounter for immunization: Secondary | ICD-10-CM | POA: Diagnosis not present

## 2016-09-20 DIAGNOSIS — E7800 Pure hypercholesterolemia, unspecified: Secondary | ICD-10-CM | POA: Diagnosis not present

## 2016-09-20 DIAGNOSIS — I10 Essential (primary) hypertension: Secondary | ICD-10-CM | POA: Diagnosis not present

## 2016-10-13 ENCOUNTER — Telehealth: Payer: Self-pay | Admitting: Internal Medicine

## 2016-10-13 NOTE — Telephone Encounter (Signed)
Pt declined to get the AWV. Thank you! °

## 2016-10-21 ENCOUNTER — Encounter: Payer: Self-pay | Admitting: Cardiovascular Disease

## 2016-10-21 ENCOUNTER — Ambulatory Visit (INDEPENDENT_AMBULATORY_CARE_PROVIDER_SITE_OTHER): Payer: Medicare Other | Admitting: Cardiovascular Disease

## 2016-10-21 VITALS — BP 150/80 | HR 82 | Ht 62.0 in | Wt 189.8 lb

## 2016-10-21 DIAGNOSIS — N183 Chronic kidney disease, stage 3 unspecified: Secondary | ICD-10-CM

## 2016-10-21 DIAGNOSIS — I10 Essential (primary) hypertension: Secondary | ICD-10-CM | POA: Diagnosis not present

## 2016-10-21 DIAGNOSIS — I2510 Atherosclerotic heart disease of native coronary artery without angina pectoris: Secondary | ICD-10-CM

## 2016-10-21 DIAGNOSIS — J449 Chronic obstructive pulmonary disease, unspecified: Secondary | ICD-10-CM

## 2016-10-21 DIAGNOSIS — I2583 Coronary atherosclerosis due to lipid rich plaque: Secondary | ICD-10-CM | POA: Diagnosis not present

## 2016-10-21 DIAGNOSIS — J4489 Other specified chronic obstructive pulmonary disease: Secondary | ICD-10-CM

## 2016-10-21 DIAGNOSIS — E118 Type 2 diabetes mellitus with unspecified complications: Secondary | ICD-10-CM

## 2016-10-21 DIAGNOSIS — F17200 Nicotine dependence, unspecified, uncomplicated: Secondary | ICD-10-CM

## 2016-10-21 DIAGNOSIS — E1165 Type 2 diabetes mellitus with hyperglycemia: Secondary | ICD-10-CM

## 2016-10-21 MED ORDER — CLONIDINE HCL 0.1 MG PO TABS: 0.1000 mg | ORAL_TABLET | Freq: Two times a day (BID) | ORAL | 11 refills | Status: DC

## 2016-10-21 MED ORDER — DOXAZOSIN MESYLATE 8 MG PO TABS: 8.0000 mg | ORAL_TABLET | Freq: Every day | ORAL | 6 refills | Status: DC

## 2016-10-21 NOTE — Progress Notes (Signed)
Cardiology Office Note  Date:  10/21/2016   ID:  Andrea Stewart, DOB April 19, 1946, MRN [id number]  PCP:  Andrea Mc, MD   Chief Complaint  Patient presents with  . other    63mo f/u. Pt states she is doing well. Reviewed meds with pt verbally.    HPI:  [name] is a 70 year old woman with history of obesity, poorly controlled diabetes, followed by Dr. Soyla Murphy, prior smoking history ,  who presents for routine followup of her shortness of breath, hypertension.  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 patient of   Dr. Derrel Nip  In follow-up today she reports that her legs hurt at rest and with exertion, walks with a walker, can not walk very far. She has a history of leg cramping Takes Lasix as needed for ankle swelling Blood pressure typically runs high at home, always 280 systolic She is followed by nephrology  Previously required prednisone shot for COPD exacerbation, shortness of breath stress at home, daughter with cancer with metastases Review of lab work with her shows creatinine 1.17, cholesterol is up from 150 up to 180  EKG on today's visit shows normal sinus rhythm with rate 82 bpm, rare PVC  Other past medical history  bronchitis at the end of December 2015 with antibiotics. For shortness of breath, she was treated with Lasix 40 mg daily. Creatinine climbed up to 1.4 with elevated BUN.  History of leg cramping, She takes tonic water, mustard for relief.  Cardiac catheterization by report more than 7 years ago that showed no significant CAD. She was previously seen by Dr. Olevia Perches.  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 Limited secondary to her arthritis conditions.  She is unable to tolerate amlodipine secondary to leg swelling, Diovan HCT (leg cramping) losartan (patient self discontinued after no BP response in a weekend -  though it would take longer to see a response), valsartan (uncertain reason),  and clonidine (fatigue).    PMH:   has a past medical history of Backache, unspecified; Degeneration of intervertebral disc, site unspecified; Depressive disorder, not elsewhere classified; Disorder of bone and cartilage, unspecified; GERD (gastroesophageal reflux disease); HTN (hypertension); left heart catheterization by cutdown; Myalgia and myositis, unspecified; Obesity, unspecified; Personal history of tobacco use, presenting hazards to health; Plantar fascial fibromatosis; S/P cholecystectomy; S/p nephrectomy; Type II or unspecified type diabetes mellitus without mention of complication, not stated as uncontrolled; Unspecified essential hypertension; and Unspecified hereditary and idiopathic peripheral neuropathy.  PSH:    Past Surgical History:  Procedure Laterality Date  . ABDOMINAL HYSTERECTOMY    . APPENDECTOMY    . BACK SURGERY     2006  . CARDIAC CATHETERIZATION     o.k. 2003  . CARPAL TUNNEL RELEASE    . CERVICAL DISCECTOMY    . CHOLECYSTECTOMY    . COLONOSCOPY  09/1999   colonoscopy-hemorrhoids, re-check 5 years (10/2006)  . dexa     osteopenia (01/2004)  . ELBOW SURGERY    . FOOT SURGERY     x 3  . KIDNEY DONATION  1991  . LUMBAR FUSION    . NECK SURGERY     06/2005  . Problem with general Anesthesia     02/2006  . REPLACEMENT TOTAL KNEE  10/2004  . trigger finger surgery    . VESICOVAGINAL FISTULA CLOSURE W/ TAH    .  VIDEO BRONCHOSCOPY Bilateral 04/02/2015   Procedure: VIDEO BRONCHOSCOPY WITHOUT FLUORO;  Surgeon: Juanito Doom, MD;  Location: [facility] ENDOSCOPY;  Service: Cardiopulmonary;  Laterality: Bilateral;    Current Outpatient Prescriptions  Medication Sig Dispense Refill  . albuterol (PROVENTIL HFA) 108 (90 BASE) MCG/ACT inhaler Inhale 2 puffs into the lungs every 4 (four) hours as needed for wheezing or shortness of breath. 1 Inhaler 2  . amLODipine (NORVASC) 10 MG tablet Take  one tablet (10mg )  daily 90 tablet 3  . aspirin 81 MG tablet Take 1 tablet (81 mg total) by mouth daily.    . bisacodyl (DULCOLAX) 10 MG suppository Place 1 suppository (10 mg total) rectally as needed for moderate constipation. 12 suppository 0  . budesonide-formoterol (SYMBICORT) 160-4.5 MCG/ACT inhaler Inhale 2 puffs into the lungs 2 (two) times daily. 1 Inhaler 5  . calcium carbonate (OS-CAL) 600 MG TABS Take 600 mg by mouth daily.      . chlorpheniramine-HYDROcodone (TUSSIONEX PENNKINETIC ER) 10-8 MG/5ML SUER Take 5 mLs by mouth every 12 (twelve) hours as needed. 115 mL 0  . DAILY MULTIPLE VITAMINS PO Take 1 tablet by mouth daily.      Marland Kitchen doxycycline (VIBRA-TABS) 100 MG tablet Take 1 tablet (100 mg total) by mouth 2 (two) times daily. 20 tablet 0  . fluticasone (VERAMYST) 27.5 MCG/SPRAY nasal spray Place 2 sprays into the nose as needed for rhinitis or allergies.     . furosemide (LASIX) 20 MG tablet One tablet twice weekly for fluid retention 30 tablet 3  . hydrochlorothiazide (HYDRODIURIL) 25 MG tablet Take 1 tablet (25 mg total) by mouth daily. 90 tablet 3  . insulin lispro (HUMALOG) 100 UNIT/ML injection Inject into the skin as directed. Sliding scale 100-150 = 2-2.5 units 150-200 = 3-3.5 units 200-250 = 4-4.5 units 251-300 = 5-5.5 units 301-350 = 6-6.5 units 350-400 = 7-7.5 units    . insulin NPH (HUMULIN N,NOVOLIN N) 100 UNIT/ML injection Inject 20 Units into the skin daily before breakfast. 22 units at bedtime    . lidocaine (XYLOCAINE) 2 % solution Use as directed 15 mLs in the mouth or throat every 3 (three) hours as needed for mouth pain (gargle; may spit or swallow). 100 mL 0  . metFORMIN (GLUCOPHAGE-XR) 500 MG 24 hr tablet Take 500 mg by mouth 2 (two) times daily.     . Misc Natural Products (TURMERIC CURCUMIN) CAPS 900mg  capsule daily    . mometasone (NASONEX) 50 MCG/ACT nasal spray Place 2 sprays into the nose daily.      . nitroGLYCERIN (NITROSTAT) 0.4 MG SL tablet Place 1  tablet (0.4 mg total) under the tongue every 5 (five) minutes as needed. Maximum 3 tablets (Patient taking differently: Place 0.4 mg under the tongue every 5 (five) minutes as needed for chest pain. Maximum 3 tablets) 25 tablet 6  . sodium chloride (OCEAN) 0.65 % SOLN nasal spray Place 2 sprays into both nostrils as needed for congestion.    Marland Kitchen Spacer/Aero-Holding Chambers (AEROCHAMBER MV) inhaler Use as instructed 1 each 0  . traMADol (ULTRAM) 50 MG tablet Take 50 mg by mouth every 8 (eight) hours as needed for moderate pain or severe pain.    . valsartan (DIOVAN) 160 MG tablet Take 1 tablet (160 mg total) by mouth at bedtime. (Patient taking differently: Take 160 mg by mouth 2 (two) times daily. ) 90 tablet 3  . zolpidem (AMBIEN) 10 MG tablet Take 10 mg by mouth at bedtime as needed.      Marland Kitchen  doxazosin (CARDURA) 8 MG tablet Take 1 tablet (8 mg total) by mouth daily. 30 tablet 6   No current facility-administered medications for this visit.      Allergies:   Morphine; Prednisone; Gabapentin; Nsaids; and Penicillins   Social History:  The patient  reports that she has been smoking Cigarettes.  She has a 7.50 pack-year smoking history. She has never used smokeless tobacco. She reports that she does not drink alcohol or use drugs.   Family History:   family history includes COPD in her father; Cancer in her father and mother; Colon cancer in her mother; Coronary artery disease in her mother; Heart attack (age of onset: 68) in her brother; Hypertension in her mother; Prostate cancer in her father; Sleep apnea in her brother.    Review of Systems: Review of Systems  Constitutional: Negative.   Respiratory: Positive for shortness of breath.   Cardiovascular: Negative.   Gastrointestinal: Negative.   Musculoskeletal: Negative.        Diffuse leg pain  Neurological: Negative.   Psychiatric/Behavioral: Negative.   All other systems reviewed and are negative.    PHYSICAL EXAM: VS:  BP (!)  150/80 (BP Location: Left Arm, Patient Position: Sitting, Cuff Size: Normal)   Pulse 82   Ht 5\' 2"  (1.575 m)   Wt 189 lb 12 oz (86.1 kg)   BMI 34.71 kg/m  , BMI Body mass index is 34.71 kg/m. GEN: Well nourished, well developed, in no acute distress , obese HEENT: normal  Neck: no JVD, carotid bruits, or masses Cardiac: RRR; no murmurs, rubs, or gallops,no edema  Respiratory:  clear to auscultation bilaterally, normal work of breathing GI: soft, nontender, nondistended, + BS MS: no deformity or atrophy  Skin: warm and dry, no rash Neuro:  Strength and sensation are intact Psych: euthymic mood, full affect    Recent Labs: No results found for requested labs within last 8760 hours.    Lipid Panel Lab Results  Component Value Date   CHOL 181 05/13/2015   HDL 66.90 05/13/2015   LDLCALC 92 05/13/2015   TRIG 110.0 05/13/2015      Wt Readings from Last 3 Encounters:  10/21/16 189 lb 12 oz (86.1 kg)  09/16/16 188 lb 9.6 oz (85.5 kg)  09/01/16 189 lb (85.7 kg)       ASSESSMENT AND PLAN:  Coronary atherosclerosis due to lipid rich plaque - Plan: EKG 12-Lead Currently with no symptoms of angina. No further workup at this time. Continue current medication regimen.  Essential hypertension She reports blood pressure is consistently elevated Long discussion concerning her prior medication use, side effects Initially we prescribed clonidine but felt she had history of fatigue Changed to Cardura 4 mg twice a day  Uncontrolled type 2 diabetes mellitus with complication, unspecified long term insulin use status (HCC) No recent hemoglobin A1c available. Last was 8.9 in 2015  Morbid obesity ([place]) We have encouraged continued exercise, careful diet management in an effort to lose weight.  COPD (chronic obstructive pulmonary disease) with chronic bronchitis (HCC) Long history of smoking, Chronic cough likely from chronic bronchitis  Tobacco use disorder We have encouraged her  to continue to work on weaning her cigarettes and smoking cessation. She will continue to work on this and does not want any assistance with chantix.   CKD (chronic kidney disease), stage III Followed by nephrology We'll try to wean off HCTZ once blood pressure improves   Total encounter time more than 25 minutes  Greater  than 50% was spent in counseling and coordination of care with the patient  Disposition:   F/U  6 months   Orders Placed This Encounter  Procedures  . EKG 12-Lead     Signed, Esmond Plants, M.D., Ph.D. 10/21/2016  [place], [place]

## 2016-10-21 NOTE — Patient Instructions (Addendum)
Medication Instructions:   Please start clonidine one pill twice a day for high pressures possible side effects fatigue, dry mouth  Other options  imdur/isosorbide,  Hydralazine,  cardura/doxazosin  Labwork:  No new labs needed  Testing/Procedures:  No further testing at this time   I recommend watching educational videos on topics of interest to you at:       [email or web address]  Enter code: [email or web address]    Follow-Up: It was a pleasure seeing you in the office today. Please call us if you have new issues that need to be addressed before your next appt.  [phone]  Your physician wants you to follow-up in: 6 months.  You will receive a reminder letter in the mail two months in advance. If you don't receive a letter, please call our office to schedule the follow-up appointment.  If you need a refill on your cardiac medications before your next appointment, please call your pharmacy.

## 2016-10-27 ENCOUNTER — Other Ambulatory Visit: Payer: Self-pay

## 2016-10-27 MED ORDER — BUDESONIDE-FORMOTEROL FUMARATE 160-4.5 MCG/ACT IN AERO: 2.0000 | INHALATION_SPRAY | Freq: Two times a day (BID) | RESPIRATORY_TRACT | 5 refills | Status: DC

## 2016-11-02 ENCOUNTER — Ambulatory Visit (INDEPENDENT_AMBULATORY_CARE_PROVIDER_SITE_OTHER): Payer: Medicare Other | Admitting: Internal Medicine

## 2016-11-02 DIAGNOSIS — I2510 Atherosclerotic heart disease of native coronary artery without angina pectoris: Secondary | ICD-10-CM | POA: Diagnosis not present

## 2016-11-02 DIAGNOSIS — E118 Type 2 diabetes mellitus with unspecified complications: Secondary | ICD-10-CM

## 2016-11-02 DIAGNOSIS — E1165 Type 2 diabetes mellitus with hyperglycemia: Secondary | ICD-10-CM

## 2016-11-02 DIAGNOSIS — Z716 Tobacco abuse counseling: Secondary | ICD-10-CM | POA: Diagnosis not present

## 2016-11-02 DIAGNOSIS — I10 Essential (primary) hypertension: Secondary | ICD-10-CM

## 2016-11-02 DIAGNOSIS — I2583 Coronary atherosclerosis due to lipid rich plaque: Secondary | ICD-10-CM

## 2016-11-02 DIAGNOSIS — IMO0002 Reserved for concepts with insufficient information to code with codable children: Secondary | ICD-10-CM

## 2016-11-02 DIAGNOSIS — Z794 Long term (current) use of insulin: Secondary | ICD-10-CM

## 2016-11-02 NOTE — Patient Instructions (Addendum)
I recommend trying the cardura instead of the clonidine  Since it is giving you a daily headache   Start with 1/2 tablet of cardura (4 mg) morning and evening   Goal is 120/70.  After a minimum of a week.    Ask your diabetes doctor about using concentrated insulin (R 500)  in an insulin pump to control your BS    Aim for 2000 Ius of Vitamin D3 daily

## 2016-11-02 NOTE — Progress Notes (Signed)
Subjective:  Patient ID: Andrea Stewart, female    DOB: 1946-05-16  Age: 70 y.o. 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  HPI Andrea Stewart presents for follow up on multiple issues including recent chest x ray , labs and hypertension   Dr Rockey Situ initiated 2  new medication for bp control,  cardura 8 mg daily, along with clonidine 0.1 mg bid  .  She is only taking the clonidine at night for the past week  And not taking any  cardura.  Today's bp is 128/76 .  The clonidine is giving her headaches daily managed with tylenol.   Has not had lipids done since June 2016 cholesterol up cal ldl 92.  Discussed aortic atherosclerosis seen on chest x ray done in NOvember.  discussed statin therapy   Last a1c was 8.0   ballen checks her feet for neuropathy.  sensation intact  Circulation  Home bp machine used an audio report Legs ache.  Did not tolelrate mega dose of Vitamin D due to constipation despite  using a stool softener. Had to go to the ER       Lab Results  Component Value Date   [facility] 92 05/13/2015      Outpatient Medications Prior to Visit  Medication Sig Dispense Refill  . albuterol (PROVENTIL HFA) 108 (90 BASE) MCG/ACT inhaler Inhale 2 puffs into the lungs every 4 (four) hours as needed for wheezing or shortness of breath. 1 Inhaler 2  . amLODipine (NORVASC) 10 MG tablet Take one tablet (10mg )  daily 90 tablet 3  . aspirin 81 MG tablet Take 1 tablet (81 mg total) by mouth daily.    . bisacodyl (DULCOLAX) 10 MG suppository Place 1 suppository (10 mg total) rectally as needed for moderate constipation. 12 suppository 0  . budesonide-formoterol (SYMBICORT) 160-4.5 MCG/ACT inhaler Inhale 2 puffs into the lungs 2 (two) times daily. 1 Inhaler 5  .  calcium carbonate (OS-CAL) 600 MG TABS Take 600 mg by mouth daily.      . chlorpheniramine-HYDROcodone (TUSSIONEX PENNKINETIC ER) 10-8 MG/5ML SUER Take 5 mLs by mouth every 12 (twelve) hours as needed. 115 mL 0  . DAILY MULTIPLE VITAMINS PO Take 1 tablet by mouth daily.      Marland Kitchen doxazosin (CARDURA) 8 MG tablet Take 1 tablet (8 mg total) by mouth daily. 30 tablet 6  . fluticasone (VERAMYST) 27.5 MCG/SPRAY nasal spray Place 2 sprays into the nose as needed for rhinitis or allergies.     . furosemide (LASIX) 20 MG tablet One tablet twice weekly for fluid retention 30 tablet 3  . hydrochlorothiazide (HYDRODIURIL) 25 MG tablet Take 1 tablet (25 mg total) by mouth daily. 90 tablet 3  . insulin lispro (HUMALOG) 100 UNIT/ML injection Inject into the skin as directed. Sliding scale 100-150 = 2-2.5 units 150-200 = 3-3.5 units 200-250 = 4-4.5 units 251-300 = 5-5.5 units 301-350 = 6-6.5 units 350-400 = 7-7.5 units    . insulin NPH (HUMULIN N,NOVOLIN N) 100 UNIT/ML injection Inject 20 Units into the skin daily before breakfast. 22 units at bedtime    . metFORMIN (GLUCOPHAGE-XR) 500 MG 24 hr tablet Take 500 mg by mouth 2 (two) times daily.     . mometasone (NASONEX) 50 MCG/ACT nasal spray Place 2 sprays into the nose daily.      Marland Kitchen  nitroGLYCERIN (NITROSTAT) 0.4 MG SL tablet Place 1 tablet (0.4 mg total) under the tongue every 5 (five) minutes as needed. Maximum 3 tablets (Patient taking differently: Place 0.4 mg under the tongue every 5 (five) minutes as needed for chest pain. Maximum 3 tablets) 25 tablet 6  . sodium chloride (OCEAN) 0.65 % SOLN nasal spray Place 2 sprays into both nostrils as needed for congestion.    Marland Kitchen Spacer/Aero-Holding Chambers (AEROCHAMBER MV) inhaler Use as instructed 1 each 0  . traMADol (ULTRAM) 50 MG tablet Take 50 mg by mouth every 8 (eight) hours as needed for moderate pain or severe pain.    . valsartan (DIOVAN) 160 MG tablet Take 1 tablet (160 mg total) by mouth at bedtime.  (Patient taking differently: Take 160 mg by mouth 2 (two) times daily. ) 90 tablet 3  . doxycycline (VIBRA-TABS) 100 MG tablet Take 1 tablet (100 mg total) by mouth 2 (two) times daily. 20 tablet 0  . lidocaine (XYLOCAINE) 2 % solution Use as directed 15 mLs in the mouth or throat every 3 (three) hours as needed for mouth pain (gargle; may spit or swallow). 100 mL 0  . Misc Natural Products (TURMERIC CURCUMIN) CAPS 900mg  capsule daily    . zolpidem (AMBIEN) 10 MG tablet Take 10 mg by mouth at bedtime as needed.       No facility-administered medications prior to visit.     Review of Systems;  Patient denies headache, fevers, malaise, unintentional weight loss, skin rash, eye pain, sinus congestion and sinus pain, sore throat, dysphagia,  hemoptysis , cough, dyspnea, wheezing, chest pain, palpitations, orthopnea, edema, abdominal pain, nausea, melena, diarrhea, constipation, flank pain, dysuria, hematuria, urinary  Frequency, nocturia, numbness, tingling, seizures,  Focal weakness, Loss of consciousness,  Tremor, insomnia, depression, anxiety, and suicidal ideation.      Objective:  BP 128/76   Pulse 89   Temp 98.3 F (36.8 C) (Oral)   Resp 14   Ht 5\' 2"  (1.575 m)   Wt 191 lb 4 oz (86.8 kg)   SpO2 95%   BMI 34.98 kg/m   BP Readings from Last 3 Encounters:  11/02/16 128/76  10/21/16 (!) 150/80  09/16/16 (!) 155/82    Wt Readings from Last 3 Encounters:  11/02/16 191 lb 4 oz (86.8 kg)  10/21/16 189 lb 12 oz (86.1 kg)  09/16/16 188 lb 9.6 oz (85.5 kg)    General appearance: alert, cooperative and appears stated age Ears: normal TM's and external ear canals both ears Throat: lips, mucosa, and tongue normal; teeth and gums normal Neck: no adenopathy, no carotid bruit, supple, symmetrical, trachea midline and thyroid not enlarged, symmetric, no tenderness/mass/nodules Back: symmetric, no curvature. ROM normal. No CVA tenderness. Lungs: clear to auscultation bilaterally Heart:  regular rate and rhythm, S1, S2 normal, no murmur, click, rub or gallop Abdomen: soft, non-tender; bowel sounds normal; no masses,  no organomegaly Pulses: 2+ and symmetric Skin: Skin color, texture, turgor normal. No rashes or lesions Lymph nodes: Cervical, supraclavicular, and axillary nodes normal.  Lab Results  Component Value Date   HGBA1C 8.9 (A) 01/01/2014   HGBA1C 9.1 (H) 07/30/2011    Lab Results  Component Value Date   CREATININE 1.17 (H) 07/19/2015   CREATININE 1.43 (H) 05/13/2015   CREATININE 1.6 (A) 05/05/2015    Lab Results  Component Value Date   WBC 6.6 07/19/2015   HGB 12.2 07/19/2015   HCT 36.8 07/19/2015   PLT 197 07/19/2015   GLUCOSE 195 (  H) 07/19/2015   CHOL 181 05/13/2015   TRIG 110.0 05/13/2015   HDL 66.90 05/13/2015   LDLDIRECT 51.9 09/07/2012   LDLCALC 92 05/13/2015   ALT 19 07/19/2015   AST 30 07/19/2015   NA 137 07/19/2015   K 4.2 07/19/2015   CL 103 07/19/2015   CREATININE 1.17 (H) 07/19/2015   BUN 21 (H) 07/19/2015   CO2 27 07/19/2015   TSH 1.35 02/25/2014   INR 1.01 03/27/2015   HGBA1C 8.9 (A) 01/01/2014    Ct Chest Lung Ca Screen Low Dose W/o Cm  Result Date: 01/05/2016 CLINICAL DATA:  70 year old female current smoker with 30 pack-year history of smoking. Lung cancer screening examination. EXAM: CT CHEST WITHOUT CONTRAST LOW-DOSE FOR LUNG CANCER SCREENING TECHNIQUE: Multidetector CT imaging of the chest was performed following the standard protocol without IV contrast. COMPARISON:  Chest CT 11/20/2014. FINDINGS: Mediastinum/Nodes: Heart size is normal. There is no significant pericardial fluid, thickening or pericardial calcification. There is atherosclerosis of the thoracic aorta, the great vessels of the mediastinum and the coronary arteries, including calcified atherosclerotic plaque in the left main, left anterior descending and left circumflex coronary arteries. No pathologically enlarged mediastinal or hilar lymph nodes. Please note  that accurate exclusion of hilar adenopathy is limited on noncontrast CT scans. Posterior to the proximal trachea and to the right side of the proximal esophagus there is again a well-circumscribed 2.0 x 2.8 cm high attenuation (76 HU) partially calcified lesion (Image 6 of series 2) which is unchanged in size in appearance compared to the prior examination 11/20/2014. This again appears to be intimately associated with and of similar attenuation characteristics to the posterior aspect of the right lobe of the thyroid gland. This results in slight left-sided deviation of the proximal esophagus. Distal esophagus is otherwise unremarkable in appearance. No axillary lymphadenopathy. Lungs/Pleura: Several tiny pulmonary nodules are noted throughout the lungs bilaterally, the largest which is in the periphery of the inferior segment of the lingula (image 174 of series 3) with a volume derived mean diameter of only 3.9 mm. No larger more suspicious appearing pulmonary nodules or masses are otherwise noted. No acute consolidative airspace disease. No pleural effusions. Upper abdomen: Unremarkable. Musculoskeletal: There are no aggressive appearing lytic or blastic lesions noted in the visualized portions of the skeleton. IMPRESSION: 1. Lung-RADS Category 2S, benign appearance or behavior. Continue annual screening with low-dose chest CT without contrast in 12 months. 2. The "S" modifier above refers to potentially clinically significant non lung cancer related findings. Specifically, there is atherosclerosis, including left main and 2 vessel coronary artery disease. Please note that although the presence of coronary artery calcium documents the presence of coronary artery disease, the severity of this disease and any potential stenosis cannot be assessed on this non-gated CT examination. Assessment for potential risk factor modification, dietary therapy or pharmacologic therapy may be warranted, if clinically indicated. 3.  Similar appearing lesion in the upper middle mediastinum. This is of uncertain etiology and significance, but is presumably benign given its stability in size and appearance compared to the prior study from 11/20/2014. 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 This is strongly favored to be benign. Electronically Signed   By: Vinnie Langton M.D.   On: 01/05/2016 08:16    Assessment & Plan:   Problem List Items Addressed This Visit    Coronary atherosclerosis    Untreated lipids.,  Needs repeat.  On  ARB,  Still smoking   Lab Results  Component Value Date   CHOL 181 05/13/2015   HDL 66.90 05/13/2015   LDLCALC 92 05/13/2015   LDLDIRECT 51.9 09/07/2012   TRIG 110.0 05/13/2015   CHOLHDL 3 05/13/2015         Relevant Medications   cloNIDine (CATAPRES) 0.1 MG tablet   Diabetes mellitus type 2 with complications, uncontrolled ([place])    Urged to revisit the insulin pump with her endocrinologist, using the concentrated insulin .last a1c was reportedly 8.0       Essential hypertension    Simplified her regimen, dc conlidine due to headache,  Start cardura at 2 mg bid       Relevant Medications   cloNIDine (CATAPRES) 0.1 MG tablet   Tobacco abuse counseling    Spent 3 minutes discussing risk of continued tobacco abuse, including but not limited to CAD, PAD, hypertension, and CA.  sHe is not interested in pharmacotherapy at this time.         I have discontinued Ms. Barkow's zolpidem, Turmeric Curcumin, doxycycline, and lidocaine. I am also having her maintain her calcium carbonate, DAILY MULTIPLE VITAMINS PO, insulin lispro, fluticasone, mometasone, insulin NPH Human, aspirin, nitroGLYCERIN, metFORMIN, sodium chloride, traMADol, albuterol, AEROCHAMBER MV, bisacodyl, amLODipine, hydrochlorothiazide, valsartan, furosemide, chlorpheniramine-HYDROcodone, doxazosin,  budesonide-formoterol, and cloNIDine.  Meds ordered this encounter  Medications  . cloNIDine (CATAPRES) 0.1 MG tablet    Sig: Take 1 tablet by mouth 2 (two) times daily.    Medications Discontinued During This Encounter  Medication Reason  . doxycycline (VIBRA-TABS) 100 MG tablet No longer needed (for PRN medications)  . lidocaine (XYLOCAINE) 2 % solution No longer needed (for PRN medications)  . Misc Natural Products (TURMERIC CURCUMIN) CAPS Completed Course  . zolpidem (AMBIEN) 10 MG tablet Completed Course    Follow-up: No Follow-up on file.   Crecencio Mc, MD

## 2016-11-03 NOTE — Assessment & Plan Note (Signed)
Urged to revisit the insulin pump with her endocrinologist, using the concentrated insulin .last a1c was reportedly 8.0

## 2016-11-03 NOTE — Assessment & Plan Note (Signed)
Simplified her regimen, dc conlidine due to headache,  Start cardura at 2 mg bid

## 2016-11-03 NOTE — Assessment & Plan Note (Signed)
Untreated lipids.,  Needs repeat.  On ARB,  Still smoking   Lab Results  Component Value Date   CHOL 181 05/13/2015   HDL 66.90 05/13/2015   LDLCALC 92 05/13/2015   LDLDIRECT 51.9 09/07/2012   TRIG 110.0 05/13/2015   CHOLHDL 3 05/13/2015

## 2016-11-03 NOTE — Assessment & Plan Note (Signed)
Spent 3 minutes discussing risk of continued tobacco abuse, including but not limited to CAD, PAD, hypertension, and CA.  sHe is not interested in pharmacotherapy at this time. 

## 2016-11-16 DIAGNOSIS — E1122 Type 2 diabetes mellitus with diabetic chronic kidney disease: Secondary | ICD-10-CM | POA: Diagnosis not present

## 2016-11-16 DIAGNOSIS — N183 Chronic kidney disease, stage 3 (moderate): Secondary | ICD-10-CM | POA: Diagnosis not present

## 2016-11-16 DIAGNOSIS — I129 Hypertensive chronic kidney disease with stage 1 through stage 4 chronic kidney disease, or unspecified chronic kidney disease: Secondary | ICD-10-CM | POA: Diagnosis not present

## 2016-11-16 DIAGNOSIS — R809 Proteinuria, unspecified: Secondary | ICD-10-CM | POA: Diagnosis not present

## 2016-11-17 ENCOUNTER — Encounter: Payer: Self-pay | Admitting: Cardiovascular Disease

## 2016-11-17 DIAGNOSIS — E1122 Type 2 diabetes mellitus with diabetic chronic kidney disease: Secondary | ICD-10-CM | POA: Diagnosis not present

## 2016-11-17 DIAGNOSIS — R809 Proteinuria, unspecified: Secondary | ICD-10-CM | POA: Diagnosis not present

## 2016-11-17 DIAGNOSIS — I129 Hypertensive chronic kidney disease with stage 1 through stage 4 chronic kidney disease, or unspecified chronic kidney disease: Secondary | ICD-10-CM | POA: Diagnosis not present

## 2016-11-17 DIAGNOSIS — N183 Chronic kidney disease, stage 3 (moderate): Secondary | ICD-10-CM | POA: Diagnosis not present

## 2016-11-17 LAB — LIPID PANEL
CHOLESTEROL: 208 mg/dL — AB (ref 0–200)
HDL: 71 mg/dL — AB (ref 35–70)
LDL CALC: 111 mg/dL
TRIGLYCERIDES: 130 mg/dL (ref 40–160)

## 2016-11-17 LAB — BASIC METABOLIC PANEL
BUN: 20 mg/dL (ref 4–21)
BUN: 20 mg/dL (ref 4–21)
Creatinine: 1.4 mg/dL — AB (ref 0.5–1.1)
Creatinine: 1.4 mg/dL — AB (ref <=1.1)
Glucose: 197 mg/dL
Potassium: 4.7 mmol/L (ref 3.4–5.3)
Sodium: 140 mmol/L (ref 137–147)
Sodium: 140 mmol/L (ref 137–147)

## 2016-11-26 ENCOUNTER — Encounter: Payer: Self-pay | Admitting: Internal Medicine

## 2016-11-30 ENCOUNTER — Ambulatory Visit: Payer: Medicare Other | Admitting: Pulmonary Disease

## 2016-12-06 ENCOUNTER — Other Ambulatory Visit: Payer: Self-pay | Admitting: Internal Medicine

## 2016-12-06 DIAGNOSIS — Z1231 Encounter for screening mammogram for malignant neoplasm of breast: Secondary | ICD-10-CM

## 2016-12-07 DIAGNOSIS — M5116 Intervertebral disc disorders with radiculopathy, lumbar region: Secondary | ICD-10-CM | POA: Diagnosis not present

## 2016-12-07 DIAGNOSIS — M47812 Spondylosis without myelopathy or radiculopathy, cervical region: Secondary | ICD-10-CM | POA: Diagnosis not present

## 2016-12-14 ENCOUNTER — Encounter: Payer: Self-pay | Admitting: Pulmonary Disease

## 2016-12-14 ENCOUNTER — Ambulatory Visit (INDEPENDENT_AMBULATORY_CARE_PROVIDER_SITE_OTHER): Payer: Medicare Other | Admitting: Pulmonary Disease

## 2016-12-14 DIAGNOSIS — J449 Chronic obstructive pulmonary disease, unspecified: Secondary | ICD-10-CM

## 2016-12-14 DIAGNOSIS — F17200 Nicotine dependence, unspecified, uncomplicated: Secondary | ICD-10-CM

## 2016-12-14 MED ORDER — BUDESONIDE-FORMOTEROL FUMARATE 160-4.5 MCG/ACT IN AERO: 2.0000 | INHALATION_SPRAY | Freq: Two times a day (BID) | RESPIRATORY_TRACT | 11 refills | Status: DC

## 2016-12-14 MED ORDER — BUDESONIDE-FORMOTEROL FUMARATE 160-4.5 MCG/ACT IN AERO: 2.0000 | INHALATION_SPRAY | Freq: Two times a day (BID) | RESPIRATORY_TRACT | 0 refills | Status: DC

## 2016-12-14 NOTE — Assessment & Plan Note (Signed)
This has been a stable interval for Andrea Stewart. She has stop smoking amazingly. She has not had an exacerbation since last visit.  Plan: Congratulated on quitting smoking. Continue Symbicort, she wants to try to get this through a pharmacy in [place] F/u 6 months

## 2016-12-14 NOTE — Assessment & Plan Note (Signed)
Continue annual CT scans for lung cancer screening

## 2016-12-14 NOTE — Patient Instructions (Signed)
Congratulations on stopping smoking. Please continue to stay away from cigarettes Continue taking Symbicort twice a day We will see you back in 6 months or sooner if needed

## 2016-12-14 NOTE — Progress Notes (Signed)
Subjective:    Patient ID: Andrea Stewart, female    DOB: Feb 10, 1946, 71 y.o.   MRN: [id number]  Synopsis: Referred in March 2016 for evaluation of ongoing shortness of breath. She had a CT scan performed at [facility] in 2015 which demonstrated tree-in-bud abnormalities in the right upper lobe as well as a left lower lobe pulmonary nodule. A repeat CT scan was performed in January 2016 showing a right lower lobe pleural-based pulmonary nodule 1.27 m in size. This resolved on a April 2016 CT chest.  11/20/2014 CT chest> tree in bud, inflammatory appearing abnormalities in the right upper lobe, left upper lobe pleural-based 1.2 cm solid nodule 02/2015 CT chest > right upper lobe tree-in-bud abnormalities persistent, the pulmonary nodule seen on the previous study has resolved, thyroid nodule stable 01/2015 PFT> normal ratio but consistent with moderate obstruction (FEV1 1.16L (58% pred)), TLC 4.57L (99% pred), RV 133% pred, DLCO 27.6 (136% pred) 2016 Bronch> AFB bal negative  HPI Chief Complaint  Patient presents with  . Follow-up    pt doing well, no complaints today.  CAT score 13   Vikie quit smoking last week.  Her breathing has been OK.  No flare ups of her COPD recently.  She keeps taking her Symbicort.  She struggles with the copay.  She was unable to get any financial assistance.    Past Medical History:  Diagnosis Date  . Backache, unspecified    s/p c-spine and l-spine fusions  . Degeneration of intervertebral disc, site unspecified   . Depressive disorder, not elsewhere classified   . Disorder of bone and cartilage, unspecified   . GERD (gastroesophageal reflux disease)   . HTN (hypertension)   . Hx of left heart catheterization by cutdown    a. 7 years ago; no significant cad; b. Lexiscan 2014: no significant ischemia, no significant EKG changes concerning for ischemia, EF 67%, overall low risk study  . Myalgia and myositis, unspecified   . Obesity, unspecified   . Personal  history of tobacco use, presenting hazards to health    1/2 ppd  . Plantar fascial fibromatosis   . S/P cholecystectomy   . S/p nephrectomy   . Type II or unspecified type diabetes mellitus without mention of complication, not stated as uncontrolled   . Unspecified essential hypertension   . 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  HENT: Negative for postnasal drip, rhinorrhea and sinus pressure.   Respiratory: Positive for shortness of breath. Negative for cough and wheezing.   Cardiovascular: Negative for chest pain, palpitations and leg swelling.       Objective:   Physical Exam Vitals:   12/14/16 1540  BP: (!) 148/86  Pulse: 72  SpO2: 96%   RA  Gen: well appearing HENT: OP clear, TM's clear, neck supple PULM: CTA B, normal percussion CV: RRR, no mgr, trace edema GI: BS+, soft, nontender Derm: no cyanosis or rash Psyche: normal mood and affect       Assessment & Plan:   COPD (chronic obstructive pulmonary disease) with chronic bronchitis This has been a stable interval for Jocelyn Lamer. She has stop smoking amazingly. She has not had an exacerbation since last visit.  Plan: Congratulated on quitting smoking. Continue Symbicort, she wants to try to get this through a pharmacy in [place] F/u 6 months  Tobacco use disorder Continue annual CT scans  for lung cancer screening   Updated Medication List Outpatient Encounter Prescriptions as of 12/14/2016  Medication Sig  . albuterol (PROVENTIL HFA) 108 (90 BASE) MCG/ACT inhaler Inhale 2 puffs into the lungs every 4 (four) hours as needed for wheezing or shortness of breath.  Marland Kitchen amLODipine (NORVASC) 10 MG tablet Take one tablet (10mg )  daily  . aspirin 81 MG tablet Take 1 tablet (81 mg total) by mouth daily.  . bisacodyl (DULCOLAX) 10 MG suppository Place 1 suppository (10 mg total) rectally as  needed for moderate constipation.  . budesonide-formoterol (SYMBICORT) 160-4.5 MCG/ACT inhaler Inhale 2 puffs into the lungs 2 (two) times daily.  . calcium carbonate (OS-CAL) 600 MG TABS Take 600 mg by mouth daily.    . carvedilol (COREG) 3.125 MG tablet Take 3.125 mg by mouth daily.  . chlorpheniramine-HYDROcodone (TUSSIONEX PENNKINETIC ER) 10-8 MG/5ML SUER Take 5 mLs by mouth every 12 (twelve) hours as needed.  . cloNIDine (CATAPRES) 0.1 MG tablet Take 1 tablet by mouth 2 (two) times daily.  Marland Kitchen DAILY MULTIPLE VITAMINS PO Take 1 tablet by mouth daily.    Marland Kitchen doxazosin (CARDURA) 8 MG tablet Take 1 tablet (8 mg total) by mouth daily.  . fluticasone (VERAMYST) 27.5 MCG/SPRAY nasal spray Place 2 sprays into the nose as needed for rhinitis or allergies.   . furosemide (LASIX) 20 MG tablet One tablet twice weekly for fluid retention  . hydrochlorothiazide (HYDRODIURIL) 25 MG tablet Take 1 tablet (25 mg total) by mouth daily.  . insulin lispro (HUMALOG) 100 UNIT/ML injection Inject into the skin as directed. Sliding scale 100-150 = 2-2.5 units 150-200 = 3-3.5 units 200-250 = 4-4.5 units 251-300 = 5-5.5 units 301-350 = 6-6.5 units 350-400 = 7-7.5 units  . insulin NPH (HUMULIN N,NOVOLIN N) 100 UNIT/ML injection Inject 20 Units into the skin daily before breakfast. 22 units at bedtime  . metFORMIN (GLUCOPHAGE-XR) 500 MG 24 hr tablet Take 500 mg by mouth 2 (two) times daily.   . mometasone (NASONEX) 50 MCG/ACT nasal spray Place 2 sprays into the nose daily.    . nitroGLYCERIN (NITROSTAT) 0.4 MG SL tablet Place 1 tablet (0.4 mg total) under the tongue every 5 (five) minutes as needed. Maximum 3 tablets (Patient taking differently: Place 0.4 mg under the tongue every 5 (five) minutes as needed for chest pain. Maximum 3 tablets)  . sodium chloride (OCEAN) 0.65 % SOLN nasal spray Place 2 sprays into both nostrils as needed for congestion.  Marland Kitchen Spacer/Aero-Holding Chambers (AEROCHAMBER MV) inhaler Use as  instructed  . traMADol (ULTRAM) 50 MG tablet Take 50 mg by mouth every 8 (eight) hours as needed for moderate pain or severe pain.  . valsartan (DIOVAN) 160 MG tablet Take 1 tablet (160 mg total) by mouth at bedtime. (Patient taking differently: Take 160 mg by mouth 2 (two) times daily. )  . budesonide-formoterol (SYMBICORT) 160-4.5 MCG/ACT inhaler Inhale 2 puffs into the lungs 2 (two) times daily.  . budesonide-formoterol (SYMBICORT) 160-4.5 MCG/ACT inhaler Inhale 2 puffs into the lungs 2 (two) times daily.   No facility-administered encounter medications on file as of 12/14/2016.

## 2016-12-15 ENCOUNTER — Telehealth: Payer: Self-pay | Admitting: *Deleted

## 2016-12-15 DIAGNOSIS — Z87891 Personal history of nicotine dependence: Secondary | ICD-10-CM

## 2016-12-15 NOTE — Telephone Encounter (Signed)
Notified patient that annual lung cancer screening low dose CT scan is due. Confirmed that patient is within the age range of 55-77, and asymptomatic, (no signs or symptoms of lung cancer). Patient denies illness that would prevent curative treatment for lung cancer if found. The patient is a former smoker, quit 1 week ago, with a 31 pack year history. The shared decision making visit was done 01/02/16. Patient is agreeable for CT scan being scheduled.

## 2017-01-03 ENCOUNTER — Ambulatory Visit: Payer: Medicare Other

## 2017-01-03 DIAGNOSIS — I129 Hypertensive chronic kidney disease with stage 1 through stage 4 chronic kidney disease, or unspecified chronic kidney disease: Secondary | ICD-10-CM | POA: Diagnosis not present

## 2017-01-03 DIAGNOSIS — E1122 Type 2 diabetes mellitus with diabetic chronic kidney disease: Secondary | ICD-10-CM | POA: Diagnosis not present

## 2017-01-03 DIAGNOSIS — N183 Chronic kidney disease, stage 3 (moderate): Secondary | ICD-10-CM | POA: Diagnosis not present

## 2017-01-03 DIAGNOSIS — R809 Proteinuria, unspecified: Secondary | ICD-10-CM | POA: Diagnosis not present

## 2017-01-03 LAB — MICROALBUMIN, URINE: Microalb, Ur: 742.5

## 2017-01-06 ENCOUNTER — Ambulatory Visit
Admission: RE | Admit: 2017-01-06 | Discharge: 2017-01-06 | Disposition: A | Discharge status: Home / Self Care | Payer: Medicare Other | Source: Ambulatory Visit | Attending: Internal Medicine | Admitting: Internal Medicine

## 2017-01-06 DIAGNOSIS — Z1231 Encounter for screening mammogram for malignant neoplasm of breast: Secondary | ICD-10-CM | POA: Diagnosis not present

## 2017-01-10 ENCOUNTER — Ambulatory Visit: Payer: Medicare Other

## 2017-01-17 DIAGNOSIS — R319 Hematuria, unspecified: Secondary | ICD-10-CM | POA: Diagnosis not present

## 2017-01-17 DIAGNOSIS — R809 Proteinuria, unspecified: Secondary | ICD-10-CM | POA: Diagnosis not present

## 2017-01-17 DIAGNOSIS — E8809 Other disorders of plasma-protein metabolism, not elsewhere classified: Secondary | ICD-10-CM | POA: Diagnosis not present

## 2017-01-17 DIAGNOSIS — Z524 Kidney donor: Secondary | ICD-10-CM | POA: Diagnosis not present

## 2017-01-17 DIAGNOSIS — E1122 Type 2 diabetes mellitus with diabetic chronic kidney disease: Secondary | ICD-10-CM | POA: Diagnosis not present

## 2017-01-17 DIAGNOSIS — N183 Chronic kidney disease, stage 3 (moderate): Secondary | ICD-10-CM | POA: Diagnosis not present

## 2017-01-17 DIAGNOSIS — D631 Anemia in chronic kidney disease: Secondary | ICD-10-CM | POA: Diagnosis not present

## 2017-01-17 DIAGNOSIS — R768 Other specified abnormal immunological findings in serum: Secondary | ICD-10-CM | POA: Diagnosis not present

## 2017-01-17 DIAGNOSIS — I10 Essential (primary) hypertension: Secondary | ICD-10-CM | POA: Diagnosis not present

## 2017-01-20 ENCOUNTER — Encounter: Payer: Self-pay | Admitting: Internal Medicine

## 2017-02-01 ENCOUNTER — Telehealth: Payer: Self-pay | Admitting: *Deleted

## 2017-02-01 NOTE — Telephone Encounter (Signed)
Contacted in attempt to reschedule lung screening scan. Patient requests call in 1-2 months as she has "too many appointments at this time".

## 2017-03-14 ENCOUNTER — Ambulatory Visit: Payer: Medicare Other | Admitting: Family

## 2017-03-15 ENCOUNTER — Ambulatory Visit: Payer: Medicare Other | Admitting: Family

## 2017-03-16 ENCOUNTER — Telehealth: Payer: Self-pay | Admitting: Internal Medicine

## 2017-03-16 ENCOUNTER — Ambulatory Visit (INDEPENDENT_AMBULATORY_CARE_PROVIDER_SITE_OTHER): Payer: Medicare Other | Admitting: Family Medicine

## 2017-03-16 ENCOUNTER — Encounter: Payer: Self-pay | Admitting: Family Medicine

## 2017-03-16 ENCOUNTER — Ambulatory Visit (INDEPENDENT_AMBULATORY_CARE_PROVIDER_SITE_OTHER): Payer: Medicare Other

## 2017-03-16 ENCOUNTER — Telehealth: Payer: Self-pay | Admitting: Family Medicine

## 2017-03-16 VITALS — BP 144/70 | HR 76 | Temp 98.7°F | Wt 192.0 lb

## 2017-03-16 DIAGNOSIS — R059 Cough, unspecified: Secondary | ICD-10-CM

## 2017-03-16 DIAGNOSIS — R05 Cough: Secondary | ICD-10-CM

## 2017-03-16 DIAGNOSIS — J189 Pneumonia, unspecified organism: Secondary | ICD-10-CM | POA: Insufficient documentation

## 2017-03-16 DIAGNOSIS — R7309 Other abnormal glucose: Secondary | ICD-10-CM

## 2017-03-16 LAB — BASIC METABOLIC PANEL
BUN: 29 mg/dL — AB (ref 6–23)
CHLORIDE: 101 meq/L (ref 96–112)
CO2: 25 meq/L (ref 19–32)
CREATININE: 1.81 mg/dL — AB (ref 0.40–1.20)
Calcium: 9.4 mg/dL (ref 8.4–10.5)
GFR: 29.22 mL/min — ABNORMAL LOW (ref >60.00)
Glucose, Bld: 410 mg/dL — ABNORMAL HIGH (ref 70–99)
POTASSIUM: 4.3 meq/L (ref 3.5–5.1)
Sodium: 132 mEq/L — ABNORMAL LOW (ref 135–145)

## 2017-03-16 LAB — GLUCOSE, POCT (MANUAL RESULT ENTRY): POC Glucose: 443 mg/dl — AB (ref 70–99)

## 2017-03-16 MED ORDER — LEVOFLOXACIN 750 MG PO TABS: 750.0000 mg | ORAL_TABLET | ORAL | 0 refills | Status: DC

## 2017-03-16 MED ORDER — HYDROCOD POLST-CPM POLST ER 10-8 MG/5ML PO SUER: 5.0000 mL | Freq: Two times a day (BID) | ORAL | 0 refills | Status: DC | PRN

## 2017-03-16 MED ORDER — LEVOFLOXACIN 750 MG PO TABS: 750.0000 mg | ORAL_TABLET | Freq: Every day | ORAL | 0 refills | Status: DC

## 2017-03-16 NOTE — Telephone Encounter (Signed)
Spoke with patient regarding lab results. Her blood sugar is 410. She reports to me that since leaving the office she has rechecked it and it came down to the 270s. she is not acidotic. Her kidney function has slightly worsened from a creatinine of 1.4 to a creatinine of 1.8. Her BUN/creatinine ratio is greater than 20 likely indicating dehydration as a cause. I discussed with her holding her Lasix tomorrow. Advised to try to hydrate as well. We'll plan on rechecking this again at her follow-up on Friday. Her new creatinine clearance is calculated to be 39. This changes her dosing of Levaquin from daily to every 48 hours. She has taken a dose today and I advised her next dose will be on Friday. Her last dose will be on Sunday. We will see how she is doing on Friday. I reinforced that if anything changes she should be reevaluated. She voiced understanding.

## 2017-03-16 NOTE — Assessment & Plan Note (Addendum)
Patient presents with upper respiratory symptoms initially followed by cough and wheezing and chest congestion. Exam findings with the left lower lung field crackles concerning for possible pneumonia versus COPD exacerbation. No wheezing today. We will treat with Levaquin given potential for community-acquired pneumonia versus COPD exacerbation in somebody with risk factors. Her creatinine clearance is 51 which is adequate for normal 750 mg Levaquin dosing. We will check a BMP today given her elevated glucoses over the last day or 2. If there is a difference in her creatinine clearance on that we may change her Levaquin dosing. We will obtain a chest x-ray. Given her elevated sugars we will avoid steroids. Tussionex given as a cough suppressant. Patient reports she has taken this previously with no adverse effects. She's given return precautions.

## 2017-03-16 NOTE — Assessment & Plan Note (Addendum)
Has been elevated over the last day or 2. Up to 500 earlier today. Has come down slightly on check in the office to 443. She has no nausea or vomiting or abdominal discomfort at this time. She is not confused. No sweating. Discussed continuing her insulin and her sliding scale as prescribed by her endocrinologist. Burnis Medin check a BMP to evaluate for any acidosis. Advised that she contact her endocrinologist and update them on her sugars as the patient reports her regimen has been changed somewhat recently. Discussed that if she develops any symptoms such as nausea, vomiting, abdominal pain, sweating, confusion, or any new symptoms she head to the emergency room for evaluation.

## 2017-03-16 NOTE — Progress Notes (Signed)
Pre visit review using our clinic review tool, if applicable. No additional management support is needed unless otherwise documented below in the visit note. 

## 2017-03-16 NOTE — Patient Instructions (Signed)
Nice to see you. We'll check a chest x-ray and starch on Levaquin to cover for possible pneumonia. We will treat you with Tussionex. We will obtain lab work to evaluate your blood sugar and kidney function. If you develop cough productive of blood, shortness of breath, fevers, nausea, vomiting, confusion, sweats, or any new or change in symptoms please seek medical attention immediately.

## 2017-03-16 NOTE — Telephone Encounter (Signed)
Pt called and stated that they she did not receive her rx for chlorpheniramine-HYDROcodone (TUSSIONEX PENNKINETIC ER) 10-8 MG/5ML SUER. We can e scribe it to [place], [place]. Call pt when completed. Please advise, thank you!  Call @ [phone]

## 2017-03-16 NOTE — Progress Notes (Signed)
Tommi Rumps, MD Phone: [phone]  Andrea Stewart is a 71 y.o. female who presents today for same-day visit.  Patient notes 10 days of symptoms. Started with some upper respiratory congestion and blowing clear mucus out of her nose though has developed chest congestion and cough productive of green mucus with some wheezing. No shortness of breath. No fevers. She's been taking Tessalon and Mucinex. She notes over the last couple of days her sugars have started to run higher as well. Have run into the 300s and 400s and earlier today up to 500. Yesterday she was able to get it down to 180 at dinnertime with her sliding scale regular insulin. She's currently on NPH 22 units twice a day and a sliding scale of regular insulin. She took 5 units of regular insulin this morning after her blood sugar was 500. She has not rechecked it. She had some nausea this morning though no vomiting. She has not had abdominal pain or recurrence of her nausea. She notes no confusion. She is followed by endocrinology in [place]. 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  PMH: Smoker   ROS see history of present illness  Objective  Physical Exam Vitals:   03/16/17 1315  BP: (!) 144/70  Pulse: 76  Temp: 98.7 F (37.1 C)    BP Readings from Last 3 Encounters:  03/16/17 (!) 144/70  12/14/16 (!) 148/86  11/02/16 128/76   Wt Readings from Last 3 Encounters:  03/16/17 192 lb (87.1 kg)  11/02/16 191 lb 4 oz (86.8 kg)  10/21/16 189 lb 12 oz (86.1 kg)    Physical Exam  Constitutional: No distress.  HENT:  Head: Normocephalic and atraumatic.  Mouth/Throat: Oropharynx is clear and moist. No oropharyngeal exudate.  Normal TMs bilaterally  Eyes: Conjunctivae are normal. Pupils are equal, round, and reactive to light.  Cardiovascular: Normal rate, regular rhythm and normal heart sounds.   Pulmonary/Chest: Effort normal. No respiratory distress. She has no wheezes. She has no rales.  Crackles noted left  lower lung field, otherwise mildly coarse breath sounds throughout with no wheezing  Abdominal: Soft. Bowel sounds are normal. She exhibits no distension. There is no tenderness. There is no rebound and no guarding.  Musculoskeletal: She exhibits no edema.  Neurological: She is alert. Gait normal.  Skin: Skin is warm and dry. She is not diaphoretic.     Assessment/Plan: Please see individual problem list.  Cough Patient presents with upper respiratory symptoms initially followed by cough and wheezing and chest congestion. Exam findings with the left lower lung field crackles concerning for possible pneumonia versus COPD exacerbation. No wheezing today. We will treat with Levaquin given potential for community-acquired pneumonia versus COPD exacerbation in somebody with risk factors. Her creatinine clearance is 51 which is adequate for normal 750 mg Levaquin dosing. We will check a BMP today given her elevated glucoses over the last day or 2. If there is a difference in her creatinine clearance on that we may change her Levaquin dosing. We will obtain a chest x-ray. Given her elevated sugars we will avoid steroids. Tussionex given as a cough suppressant. Patient reports she has taken this previously with no adverse effects. She's given return precautions.  Elevated glucose Has been elevated over the last day or 2. Up to 500 earlier today. Has come down slightly on check in the office to 443. She has no nausea or vomiting or abdominal discomfort at this time. She is not confused. No sweating. Discussed continuing her  insulin and her sliding scale as prescribed by her endocrinologist. Burnis Medin check a BMP to evaluate for any acidosis. Advised that she contact her endocrinologist and update them on her sugars as the patient reports her regimen has been changed somewhat recently. Discussed that if she develops any symptoms such as nausea, vomiting, abdominal pain, sweating, confusion, or any new symptoms she  head to the emergency room for evaluation.   Orders Placed This Encounter  Procedures  . DG Chest 2 View    Standing Status:   Future    Number of Occurrences:   1    Standing Expiration Date:   05/16/2018    Order Specific Question:   Reason for Exam (SYMPTOM  OR DIAGNOSIS REQUIRED)    Answer:   cough, congestion, wheezing, left lower lung field crackles, history COPD    Order Specific Question:   Preferred imaging location?    Answer:   [facility] Specific Question:   Radiology Contrast Protocol - do NOT remove file path    Answer:   [email or web address]  . Basic Metabolic Panel (BMET)  . POCT Glucose (CBG)    Meds ordered this encounter  Medications  . levofloxacin (LEVAQUIN) 750 MG tablet    Sig: Take 1 tablet (750 mg total) by mouth daily.    Dispense:  5 tablet    Refill:  0  . DISCONTD: chlorpheniramine-HYDROcodone (TUSSIONEX PENNKINETIC ER) 10-8 MG/5ML SUER    Sig: Take 5 mLs by mouth every 12 (twelve) hours as needed.    Dispense:  115 mL    Refill:  0  . chlorpheniramine-HYDROcodone (TUSSIONEX PENNKINETIC ER) 10-8 MG/5ML SUER    Sig: Take 5 mLs by mouth every 12 (twelve) hours as needed.    Dispense:  115 mL    Refill:  0    Tommi Rumps, MD [place]

## 2017-03-16 NOTE — Telephone Encounter (Signed)
Patient notified rx is at the front desk to pick up

## 2017-03-18 ENCOUNTER — Encounter: Payer: Self-pay | Admitting: Family Medicine

## 2017-03-18 ENCOUNTER — Ambulatory Visit (INDEPENDENT_AMBULATORY_CARE_PROVIDER_SITE_OTHER): Payer: Medicare Other | Admitting: Family Medicine

## 2017-03-18 VITALS — BP 150/70 | HR 77 | Temp 98.5°F | Wt 192.8 lb

## 2017-03-18 DIAGNOSIS — N179 Acute kidney failure, unspecified: Secondary | ICD-10-CM | POA: Insufficient documentation

## 2017-03-18 DIAGNOSIS — J189 Pneumonia, unspecified organism: Secondary | ICD-10-CM

## 2017-03-18 DIAGNOSIS — J181 Lobar pneumonia, unspecified organism: Secondary | ICD-10-CM

## 2017-03-18 DIAGNOSIS — R7309 Other abnormal glucose: Secondary | ICD-10-CM

## 2017-03-18 LAB — BASIC METABOLIC PANEL
BUN: 21 mg/dL (ref 6–23)
CHLORIDE: 106 meq/L (ref 96–112)
CO2: 25 mEq/L (ref 19–32)
CREATININE: 1.6 mg/dL — AB (ref 0.40–1.20)
Calcium: 9 mg/dL (ref 8.4–10.5)
GFR: 33.69 mL/min — ABNORMAL LOW (ref >60.00)
Glucose, Bld: 319 mg/dL — ABNORMAL HIGH (ref 70–99)
POTASSIUM: 4.2 meq/L (ref 3.5–5.1)
Sodium: 137 mEq/L (ref 135–145)

## 2017-03-18 NOTE — Progress Notes (Signed)
  Tommi Rumps, MD Phone: [phone]  Andrea Stewart is a 71 y.o. female who presents today for follow-up.  Patient seen 2 days ago for pneumonia. Right lower lobe pneumonia possibly on chest x-ray. She's been on Levaquin every 48 hour dosing. She has not taken her second dose yet. Cough is better. Nonproductive. No fevers, wheezing, or shortness of breath. She was found to be slightly dehydrated as well and has been drinking plenty of fluids. Urinating normally. Notes her CBGs have come down since going on the antibiotic. Was 128 this morning.  PMH: smoker   ROS see history of present illness  Objective  Physical Exam Vitals:   03/18/17 1029  BP: (!) 150/70  Pulse: 77  Temp: 98.5 F (36.9 C)    BP Readings from Last 3 Encounters:  03/18/17 (!) 150/70  03/16/17 (!) 144/70  12/14/16 (!) 148/86   Wt Readings from Last 3 Encounters:  03/18/17 192 lb 12.8 oz (87.5 kg)  03/16/17 192 lb (87.1 kg)  11/02/16 191 lb 4 oz (86.8 kg)    Physical Exam  Constitutional: No distress.  Cardiovascular: Normal rate, regular rhythm and normal heart sounds.   Pulmonary/Chest: Effort normal and breath sounds normal.  Musculoskeletal: She exhibits no edema.  Neurological: She is alert.  Skin: Skin is warm and dry. She is not diaphoretic.     Assessment/Plan: Please see individual problem list.  Elevated glucose Improved. Suspect related to her pneumonia. She'll continue to monitor her sugars.  Community acquired pneumonia Patient with x-ray findings of pneumonia on prior visit. Has been improving. Discussed Levaquin every 48 hour dosing given her renal function. She will take another dose today and then an additional dose 48 hours later and then discontinue the medication. She'll continue to monitor her symptoms. If worsens or develops new symptoms she'll be reevaluated.  AKI (acute kidney injury) ([place]) Noted on lab work. Suspect related to acute illness. We'll recheck today.  She has been hydrating. Normal urination.   Orders Placed This Encounter  Procedures  . Basic metabolic panel    Tommi Rumps, MD [place]

## 2017-03-18 NOTE — Assessment & Plan Note (Signed)
Patient with x-ray findings of pneumonia on prior visit. Has been improving. Discussed Levaquin every 48 hour dosing given her renal function. She will take another dose today and then an additional dose 48 hours later and then discontinue the medication. She'll continue to monitor her symptoms. If worsens or develops new symptoms she'll be reevaluated.

## 2017-03-18 NOTE — Patient Instructions (Signed)
Nice to see you. I'm glad you're feeling better. Please continue to monitor your blood sugars. You will take the Levaquin every 48 hours for 3 total doses. Please take a probiotic with this. If you have persistent loose stools with this please let us know. If you develop shortness of breath, cough productive of blood, fevers, or any new or changing symptoms please seek medical attention medially.

## 2017-03-18 NOTE — Progress Notes (Signed)
Pre visit review using our clinic review tool, if applicable. No additional management support is needed unless otherwise documented below in the visit note. 

## 2017-03-18 NOTE — Assessment & Plan Note (Signed)
Noted on lab work. Suspect related to acute illness. We'll recheck today. She has been hydrating. Normal urination.

## 2017-03-18 NOTE — Assessment & Plan Note (Signed)
Improved. Suspect related to her pneumonia. She'll continue to monitor her sugars.

## 2017-03-21 DIAGNOSIS — E7800 Pure hypercholesterolemia, unspecified: Secondary | ICD-10-CM | POA: Diagnosis not present

## 2017-03-21 DIAGNOSIS — G609 Hereditary and idiopathic neuropathy, unspecified: Secondary | ICD-10-CM | POA: Diagnosis not present

## 2017-03-21 DIAGNOSIS — E1165 Type 2 diabetes mellitus with hyperglycemia: Secondary | ICD-10-CM | POA: Diagnosis not present

## 2017-03-21 DIAGNOSIS — I10 Essential (primary) hypertension: Secondary | ICD-10-CM | POA: Diagnosis not present

## 2017-03-23 ENCOUNTER — Telehealth: Payer: Self-pay | Admitting: Internal Medicine

## 2017-03-23 ENCOUNTER — Telehealth: Payer: Self-pay | Admitting: *Deleted

## 2017-03-23 DIAGNOSIS — Z87891 Personal history of nicotine dependence: Secondary | ICD-10-CM

## 2017-03-23 NOTE — Telephone Encounter (Signed)
Notified patient that annual lung cancer screening low dose CT scan is due currently or will be in near future. Confirmed that patient is within the age range of 55-77, and asymptomatic, (no signs or symptoms of lung cancer). Patient denies illness that would prevent curative treatment for lung cancer if found. Verified smoking history, (former, quit 11/15/16, 31 pack year ). The shared decision making visit was done 01/02/16. Patient is agreeable for CT scan being scheduled.

## 2017-03-23 NOTE — Telephone Encounter (Signed)
Scheduled 03/30/17

## 2017-03-23 NOTE — Telephone Encounter (Signed)
Pt will call back to schedule AWV °

## 2017-03-29 ENCOUNTER — Ambulatory Visit
Admission: RE | Admit: 2017-03-29 | Discharge: 2017-03-29 | Disposition: A | Discharge status: Home / Self Care | Payer: Medicare Other | Source: Ambulatory Visit | Attending: Oncology | Admitting: Oncology

## 2017-03-29 DIAGNOSIS — Z122 Encounter for screening for malignant neoplasm of respiratory organs: Secondary | ICD-10-CM | POA: Insufficient documentation

## 2017-03-29 DIAGNOSIS — Z87891 Personal history of nicotine dependence: Secondary | ICD-10-CM | POA: Diagnosis not present

## 2017-03-29 DIAGNOSIS — F17210 Nicotine dependence, cigarettes, uncomplicated: Secondary | ICD-10-CM | POA: Diagnosis not present

## 2017-03-30 ENCOUNTER — Ambulatory Visit: Payer: Medicare Other

## 2017-03-31 ENCOUNTER — Encounter: Payer: Self-pay | Admitting: *Deleted

## 2017-04-22 ENCOUNTER — Ambulatory Visit (INDEPENDENT_AMBULATORY_CARE_PROVIDER_SITE_OTHER): Payer: Medicare Other | Admitting: Cardiovascular Disease

## 2017-04-22 ENCOUNTER — Encounter: Payer: Self-pay | Admitting: Cardiovascular Disease

## 2017-04-22 VITALS — BP 144/64 | HR 72 | Ht 62.0 in | Wt 197.0 lb

## 2017-04-22 DIAGNOSIS — I10 Essential (primary) hypertension: Secondary | ICD-10-CM | POA: Diagnosis not present

## 2017-04-22 DIAGNOSIS — I2583 Coronary atherosclerosis due to lipid rich plaque: Secondary | ICD-10-CM

## 2017-04-22 DIAGNOSIS — I2510 Atherosclerotic heart disease of native coronary artery without angina pectoris: Secondary | ICD-10-CM | POA: Diagnosis not present

## 2017-04-22 DIAGNOSIS — I739 Peripheral vascular disease, unspecified: Secondary | ICD-10-CM | POA: Diagnosis not present

## 2017-04-22 NOTE — Patient Instructions (Addendum)
Medication Instructions:   No medication changes made  Labwork:  No new labs needed  Testing/Procedures:  We will order a lower extremity arterial ultrasound/ABIs   I recommend watching educational videos on topics of interest to you at:       [email or web address]  Enter code: [email or web address]    Follow-Up: It was a pleasure seeing you in the office today. Please call us if you have new issues that need to be addressed before your next appt.  [phone]  Your physician wants you to follow-up in: 12 months.  You will receive a reminder letter in the mail two months in advance. If you don't receive a letter, please call our office to schedule the follow-up appointment.  If you need a refill on your cardiac medications before your next appointment, please call your pharmacy.

## 2017-04-22 NOTE — Progress Notes (Signed)
Cardiology Office Note  Date:  04/22/2017   ID:  Andrea Stewart, DOB 10/16/1946, MRN [id number]  PCP:  Crecencio Mc, MD   Chief Complaint  Patient presents with  . OTHER    6 month f/u c/o sob/edema and shoulder blade pain. Meds reviewed verbally with pt.    HPI:  Andrea Stewart is a 71 year old woman with history of  obesity,  COPD, smoking poorly controlled diabetes, followed by Dr. Soyla Murphy,  prior smoking history , Cardiac catheterization by report more than 7 years ago that showed no significant CAD. 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   who presents for routine followup of her shortness of breath, hypertension.   legs hurt at rest and with exertion,  walks with a walker, can not walk very far.   Uses a cane, Right Groin pain Seen by Dr. Carloyn Manner Was told she had hip problem  Worsening renal function, Previously was taking Lasix every day, she felt dehydrated Now taking Lasix every other day   history of leg cramping Very minimal leg swelling  EKG personally reviewed by myself on todays visit Shows normal sinus rhythm with rate 72 bpm no significant ST or T-wave changes  Other past medical history reviewed Previously required prednisone shot for COPD exacerbation, shortness of breath stress at home, daughter with cancer with metastases   bronchitis at the end of December 2015 with antibiotics. For shortness of breath, she was treated with Lasix 40 mg daily. Creatinine climbed up to 1.4 with elevated BUN.  History of leg cramping, She takes tonic water, mustard for relief.  Cardiac catheterization by report more than 7 years ago that showed no significant CAD. She was previously seen by Dr. Olevia Perches.  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 Limited secondary to her arthritis conditions.  She is unable to tolerate amlodipine secondary  to leg swelling, Diovan HCT (leg cramping) losartan (patient self discontinued after no BP response in a weekend - though it would take longer to see a response), valsartan (uncertain reason),  and clonidine (fatigue).    PMH:   has a past medical history of Backache, unspecified; Degeneration of intervertebral disc, site unspecified; Depressive disorder, not elsewhere classified; Disorder of bone and cartilage, unspecified; GERD (gastroesophageal reflux disease); HTN (hypertension); left heart catheterization by cutdown; Myalgia and myositis, unspecified; Obesity, unspecified; Personal history of tobacco use, presenting hazards to health; Plantar fascial fibromatosis; S/P cholecystectomy; S/p nephrectomy; Type II or unspecified type diabetes mellitus without mention of complication, not stated as uncontrolled; Unspecified essential hypertension; and Unspecified hereditary and idiopathic peripheral neuropathy.  PSH:    Past Surgical History:  Procedure Laterality Date  . ABDOMINAL HYSTERECTOMY    . APPENDECTOMY    . BACK SURGERY     2006  . CARDIAC CATHETERIZATION     o.k. 2003  . CARPAL TUNNEL RELEASE    . CERVICAL DISCECTOMY    . CHOLECYSTECTOMY    . COLONOSCOPY  09/1999   colonoscopy-hemorrhoids, re-check 5 years (10/2006)  . dexa     osteopenia (01/2004)  . ELBOW SURGERY    . FOOT SURGERY     x 3  . KIDNEY DONATION  1991  . LUMBAR FUSION    . NECK SURGERY     06/2005  . Problem with general Anesthesia     02/2006  . REPLACEMENT TOTAL KNEE  10/2004  . trigger finger surgery    . VESICOVAGINAL FISTULA CLOSURE W/ TAH    . VIDEO BRONCHOSCOPY Bilateral 04/02/2015   Procedure: VIDEO BRONCHOSCOPY WITHOUT FLUORO;  Surgeon: Juanito Doom, MD;  Location: [facility];  Service: Cardiopulmonary;  Laterality: Bilateral;    Current Outpatient Prescriptions  Medication Sig Dispense Refill  . albuterol (PROVENTIL HFA) 108 (90 BASE) MCG/ACT inhaler Inhale 2 puffs into the lungs every 4  (four) hours as needed for wheezing or shortness of breath. 1 Inhaler 2  . amLODipine (NORVASC) 10 MG tablet Take one tablet (10mg )  daily 90 tablet 3  . aspirin 81 MG tablet Take 1 tablet (81 mg total) by mouth daily.    . bisacodyl (DULCOLAX) 10 MG suppository Place 1 suppository (10 mg total) rectally as needed for moderate constipation. 12 suppository 0  . budesonide-formoterol (SYMBICORT) 160-4.5 MCG/ACT inhaler Inhale 2 puffs into the lungs 2 (two) times daily. 1 Inhaler 5  . calcium carbonate (OS-CAL) 600 MG TABS Take 600 mg by mouth daily.      . carvedilol (COREG) 3.125 MG tablet Take 3.125 mg by mouth 2 (two) times daily with a meal.     . chlorpheniramine-HYDROcodone (TUSSIONEX PENNKINETIC ER) 10-8 MG/5ML SUER Take 5 mLs by mouth every 12 (twelve) hours as needed. 115 mL 0  . cloNIDine (CATAPRES) 0.1 MG tablet Take 1 tablet by mouth 2 (two) times daily.    Marland Kitchen DAILY MULTIPLE VITAMINS PO Take 1 tablet by mouth daily.      . fluticasone (VERAMYST) 27.5 MCG/SPRAY nasal spray Place 2 sprays into the nose as needed for rhinitis or allergies.     . furosemide (LASIX) 40 MG tablet Take 40 mg by mouth daily as needed.    . insulin lispro (HUMALOG) 100 UNIT/ML injection Inject into the skin as directed. Sliding scale 100-150 = 2-2.5 units 150-200 = 3-3.5 units 200-250 = 4-4.5 units 251-300 = 5-5.5 units 301-350 = 6-6.5 units 350-400 = 7-7.5 units    . insulin NPH (HUMULIN N,NOVOLIN N) 100 UNIT/ML injection Inject 20 Units into the skin daily before breakfast. 22 units at bedtime    . levofloxacin (LEVAQUIN) 750 MG tablet Take 1 tablet (750 mg total) by mouth every other day. 5 tablet 0  . metFORMIN (GLUCOPHAGE-XR) 500 MG 24 hr tablet Take 500 mg by mouth 2 (two) times daily.     . mometasone (NASONEX) 50 MCG/ACT nasal spray Place 2 sprays into the nose daily as needed.     . nitroGLYCERIN (NITROSTAT) 0.4 MG SL tablet Place 1 tablet (0.4 mg total) under the tongue every 5 (five) minutes as  needed. Maximum 3 tablets (Patient taking differently: Place 0.4 mg under the tongue every 5 (five) minutes as needed for chest pain. Maximum 3 tablets) 25 tablet 6  . sodium chloride (OCEAN) 0.65 % SOLN nasal spray Place 2 sprays into both nostrils as needed for congestion.    Marland Kitchen Spacer/Aero-Holding Chambers (AEROCHAMBER MV) inhaler Use as instructed 1 each 0  . traMADol (ULTRAM) 50 MG tablet Take 50 mg by mouth every 8 (eight) hours as needed for moderate pain or severe pain.    . valsartan (DIOVAN) 160 MG tablet Take 1 tablet (160 mg total) by mouth at bedtime. (Patient taking differently: Take 160 mg by mouth 2 (two) times daily. ) 90 tablet 3   No current facility-administered medications for this visit.      Allergies:   Morphine; Prednisone; Gabapentin; Nsaids; and Penicillins  Social History:  The patient  reports that she has been smoking Cigarettes.  She has a 7.50 pack-year smoking history. She has never used smokeless tobacco. She reports that she does not drink alcohol or use drugs.   Family History:   family history includes COPD in her father; Cancer in her father and mother; Colon cancer in her mother; Coronary artery disease in her mother; Heart attack (age of onset: 58) in her brother; Hypertension in her mother; Prostate cancer in her father; Sleep apnea in her brother.    Review of Systems: Review of Systems  Constitutional: Negative.   Respiratory: Negative.   Cardiovascular: Positive for leg swelling.  Gastrointestinal: Negative.   Musculoskeletal: Negative.        Diffuse leg pain  Neurological: Negative.   Psychiatric/Behavioral: Negative.   All other systems reviewed and are negative.    PHYSICAL EXAM: VS:  BP (!) 144/64 (BP Location: Left Arm, Patient Position: Sitting, Cuff Size: Large)   Pulse 72   Ht 5\' 2"  (1.575 m)   Wt 197 lb (89.4 kg)   BMI 36.03 kg/m  , BMI Body mass index is 36.03 kg/m. GEN: Well nourished, well developed, in no acute distress  , obese HEENT: normal  Neck: no JVD, carotid bruits, or masses Cardiac: RRR; no murmurs, rubs, or gallops,no edema  Respiratory:  clear to auscultation bilaterally, normal work of breathing GI: soft, nontender, nondistended, + BS MS: no deformity or atrophy  Skin: warm and dry, no rash Neuro:  Strength and sensation are intact Psych: euthymic mood, full affect    Recent Labs: 03/18/2017: BUN 21; Creatinine, Ser 1.60; Potassium 4.2; Sodium 137    Lipid Panel Lab Results  Component Value Date   CHOL 208 (A) 11/17/2016   HDL 71 (A) 11/17/2016   LDLCALC 111 11/17/2016   TRIG 130 11/17/2016      Wt Readings from Last 3 Encounters:  04/22/17 197 lb (89.4 kg)  03/29/17 190 lb (86.2 kg)  03/18/17 192 lb 12.8 oz (87.5 kg)       ASSESSMENT AND PLAN:  Coronary atherosclerosis due to lipid rich plaque - Plan: EKG 12-Lead Currently with no symptoms of angina. No further workup at this time. Continue current medication regimen.  Essential hypertension Blood pressure is well controlled on today's visit. No changes made to the medications.  Uncontrolled type 2 diabetes mellitus with complication, unspecified long term insulin use status ([place]) We have encouraged continued exercise, careful diet management in an effort to lose weight.  COPD (chronic obstructive pulmonary disease) with chronic bronchitis (HCC) Long history of smoking, Still smoking, trying to quit, does not want help  Tobacco use disorder We have encouraged her to continue to work on weaning her cigarettes and smoking cessation. She will continue to work on this and does not want any assistance with chantix.   CKD (chronic kidney disease), stage III Followed by nephrology Taking Lasix every other day   Total encounter time more than 25 minutes  Greater than 50% was spent in counseling and coordination of care with the patient  Disposition:   F/U  12 months   Orders Placed This Encounter  Procedures  . EKG  12-Lead     Signed, Esmond Plants, M.D., Ph.D. 04/22/2017  [place], [place]

## 2017-05-05 ENCOUNTER — Other Ambulatory Visit: Payer: Self-pay | Admitting: Cardiovascular Disease

## 2017-05-05 DIAGNOSIS — I739 Peripheral vascular disease, unspecified: Secondary | ICD-10-CM

## 2017-05-09 DIAGNOSIS — I129 Hypertensive chronic kidney disease with stage 1 through stage 4 chronic kidney disease, or unspecified chronic kidney disease: Secondary | ICD-10-CM | POA: Diagnosis not present

## 2017-05-09 DIAGNOSIS — N183 Chronic kidney disease, stage 3 (moderate): Secondary | ICD-10-CM | POA: Diagnosis not present

## 2017-05-09 DIAGNOSIS — E1122 Type 2 diabetes mellitus with diabetic chronic kidney disease: Secondary | ICD-10-CM | POA: Diagnosis not present

## 2017-05-09 DIAGNOSIS — R809 Proteinuria, unspecified: Secondary | ICD-10-CM | POA: Diagnosis not present

## 2017-05-09 DIAGNOSIS — R801 Persistent proteinuria, unspecified: Secondary | ICD-10-CM | POA: Diagnosis not present

## 2017-05-24 DIAGNOSIS — M47812 Spondylosis without myelopathy or radiculopathy, cervical region: Secondary | ICD-10-CM | POA: Diagnosis not present

## 2017-05-26 ENCOUNTER — Ambulatory Visit: Payer: Medicare Other

## 2017-05-26 DIAGNOSIS — I739 Peripheral vascular disease, unspecified: Secondary | ICD-10-CM | POA: Diagnosis not present

## 2017-05-26 LAB — VAS US LOWER EXTREMITY ARTERIAL DUPLEX
LEFT POPLITEAL PROX DYS VEL: 8 cm/s
LEFT SFA DIST DYS VEL: 0 cm/s
LPERODISTDIA: 0 cm/s
LPERODISTSYS: 46 cm/s
LPTIBDISTDIA: 0 cm/s
LSFAPROXDYS: 0 cm/s
Left popliteal prox sys PSV: 76 cm/s
Left super femoral dist sys PSV: -221 cm/s
Left super femoral mid sys PSV: -560 cm/s
Left super femoral prox sys PSV: 88 cm/s
RIGHT POST TIB DIST SYS: 32 cm/s
RIGHT SUPER FEMORAL MID EDV: 0 cm/s
RIGHT SUPER FEMORAL PROX EDV: 0 cm/s
RPERMIN: 0 m/s
RTANTTIBDIA: 0 cm/s
RTIBDISTDIA: 6 cm/s
RTPOPPROXDIA: 0 cm/s
RTSFADISTDIA: 0 cm/s
Right ant tibeal sys PSV: 80 cm/s
Right peroneal sys PSV: 35 cm/s
Right popliteal prox sys PSV: 178 cm/s
Right super femoral dist sys PSV: -128 cm/s
Right super femoral mid sys PSV: -137 cm/s
Right super femoral prox sys PSV: 145 cm/s
left post tibial dist sys: 44 cm/s

## 2017-06-06 ENCOUNTER — Ambulatory Visit (INDEPENDENT_AMBULATORY_CARE_PROVIDER_SITE_OTHER): Payer: Medicare Other | Admitting: Cardiovascular Disease

## 2017-06-06 ENCOUNTER — Encounter: Payer: Self-pay | Admitting: Cardiovascular Disease

## 2017-06-06 VITALS — BP 177/92 | HR 72 | Ht 61.0 in | Wt 197.2 lb

## 2017-06-06 DIAGNOSIS — Z720 Tobacco use: Secondary | ICD-10-CM

## 2017-06-06 DIAGNOSIS — I10 Essential (primary) hypertension: Secondary | ICD-10-CM

## 2017-06-06 DIAGNOSIS — I739 Peripheral vascular disease, unspecified: Secondary | ICD-10-CM

## 2017-06-06 NOTE — Patient Instructions (Signed)
Medication Instructions: Continue same medications.   Labwork: None.   Procedures/Testing: None.   Follow-Up: 6 months with Dr. Arida.   Any Additional Special Instructions Will Be Listed Below (If Applicable).     If you need a refill on your cardiac medications before your next appointment, please call your pharmacy.   

## 2017-06-06 NOTE — Progress Notes (Signed)
Cardiology Office Note   Date:  06/06/2017   ID:  Andrea Stewart, DOB 04/20/1946, MRN [id number]  PCP:  Crecencio Mc, MD  Cardiologist: Dr. Rockey Situ  Chief Complaint  Patient presents with  . other    Ref by Dr. Rockey Situ for evaluation of LE arterial doppler. Meds reviewed by the pt. verbally. Pt. c/o LE edema, pain in legs when walking as well as cramping in the left calf.       History of Present Illness: Andrea Stewart is a 71 y.o. female who was referred by Dr. Rockey Situ for evaluation of peripheral arterial disease. She has known history of COPD, tobacco use and type 2 diabetes. Previous cardiac catheterization showed no significant coronary artery disease. 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 She had 2 previous back surgeries with chronic pain and also has arthritis with bilateral knee replacement and hip discomfort. She is limited by significant exertional shortness of breath and she also walks with a cane due to the above limitations. She complains of bilateral leg pain with walking most noticeable in the left calf and the right ankle. She usually has to stop due to both what seems to be claudication and shortness of breath. She has occasional cramps at night at rest but no lower extremity ulcerations or wounds. She underwent noninvasive vascular studies which showed mildly reduced ABI bilaterally in the 0.8 range. Duplex showed evidence of high-grade stenosis in the left SFA.  Past Medical History:  Diagnosis Date  . Backache, unspecified    s/p c-spine and l-spine fusions  . Degeneration of intervertebral disc, site unspecified   . Depressive disorder, not elsewhere classified   . Disorder of bone and cartilage, unspecified   . GERD (gastroesophageal reflux disease)   . HTN (hypertension)   . Hx of left heart catheterization by cutdown    a. 7 years ago; no significant cad; b. Lexiscan 2014: no significant ischemia, no  significant EKG changes concerning for ischemia, EF 67%, overall low risk study  . Myalgia and myositis, unspecified   . Obesity, unspecified   . Personal history of tobacco use, presenting hazards to health    1/2 ppd  . Plantar fascial fibromatosis   . S/P cholecystectomy   . S/p nephrectomy   . Type II or unspecified type diabetes mellitus without mention of complication, not stated as uncontrolled   . Unspecified essential hypertension   . Unspecified hereditary and idiopathic peripheral neuropathy    secondary to diabetes    Past Surgical History:  Procedure Laterality Date  . ABDOMINAL HYSTERECTOMY    . APPENDECTOMY    . BACK SURGERY     2006  . CARDIAC CATHETERIZATION     o.k. 2003  . CARPAL TUNNEL RELEASE    . CERVICAL DISCECTOMY    . CHOLECYSTECTOMY    . COLONOSCOPY  09/1999   colonoscopy-hemorrhoids, re-check 5 years (10/2006)  . dexa     osteopenia (01/2004)  . ELBOW SURGERY    . FOOT SURGERY     x 3  . KIDNEY DONATION  1991  . LUMBAR FUSION    . NECK SURGERY     06/2005  . Problem with general Anesthesia     02/2006  . REPLACEMENT TOTAL KNEE  10/2004  . trigger finger surgery    . VESICOVAGINAL FISTULA CLOSURE W/ TAH    . VIDEO BRONCHOSCOPY Bilateral 04/02/2015   Procedure: VIDEO BRONCHOSCOPY WITHOUT FLUORO;  Surgeon: Juanito Doom, MD;  Location: [facility];  Service: Cardiopulmonary;  Laterality: Bilateral;     Current Outpatient Prescriptions  Medication Sig Dispense Refill  . albuterol (PROVENTIL HFA) 108 (90 BASE) MCG/ACT inhaler Inhale 2 puffs into the lungs every 4 (four) hours as needed for wheezing or shortness of breath. 1 Inhaler 2  . amLODipine (NORVASC) 10 MG tablet Take one tablet (10mg )  daily 90 tablet 3  . aspirin 81 MG tablet Take 1 tablet (81 mg total) by mouth daily.    . bisacodyl (DULCOLAX) 10 MG suppository Place 1 suppository (10 mg total) rectally as needed for moderate constipation. 12 suppository 0  .  budesonide-formoterol (SYMBICORT) 160-4.5 MCG/ACT inhaler Inhale 2 puffs into the lungs 2 (two) times daily. 1 Inhaler 5  . calcium carbonate (OS-CAL) 600 MG TABS Take 600 mg by mouth daily.      . carvedilol (COREG) 3.125 MG tablet Take 3.125 mg by mouth 2 (two) times daily with a meal.     . chlorpheniramine-HYDROcodone (TUSSIONEX PENNKINETIC ER) 10-8 MG/5ML SUER Take 5 mLs by mouth every 12 (twelve) hours as needed. 115 mL 0  . cloNIDine (CATAPRES) 0.1 MG tablet Take 1 tablet by mouth 2 (two) times daily.    Marland Kitchen DAILY MULTIPLE VITAMINS PO Take 1 tablet by mouth daily.      . fluticasone (VERAMYST) 27.5 MCG/SPRAY nasal spray Place 2 sprays into the nose as needed for rhinitis or allergies.     . furosemide (LASIX) 20 MG tablet Take 20 mg by mouth daily.    . furosemide (LASIX) 40 MG tablet Take 40 mg by mouth daily as needed.    . insulin lispro (HUMALOG) 100 UNIT/ML injection Inject into the skin as directed. Sliding scale 100-150 = 2-2.5 units 150-200 = 3-3.5 units 200-250 = 4-4.5 units 251-300 = 5-5.5 units 301-350 = 6-6.5 units 350-400 = 7-7.5 units    . insulin NPH (HUMULIN N,NOVOLIN N) 100 UNIT/ML injection Inject 20 Units into the skin daily before breakfast. 22 units at bedtime    . levofloxacin (LEVAQUIN) 750 MG tablet Take 1 tablet (750 mg total) by mouth every other day. 5 tablet 0  . metFORMIN (GLUCOPHAGE-XR) 500 MG 24 hr tablet Take 500 mg by mouth 2 (two) times daily.     . mometasone (NASONEX) 50 MCG/ACT nasal spray Place 2 sprays into the nose daily as needed.     . nitroGLYCERIN (NITROSTAT) 0.4 MG SL tablet Place 1 tablet (0.4 mg total) under the tongue every 5 (five) minutes as needed. Maximum 3 tablets (Patient taking differently: Place 0.4 mg under the tongue every 5 (five) minutes as needed for chest pain. Maximum 3 tablets) 25 tablet 6  . sodium chloride (OCEAN) 0.65 % SOLN nasal spray Place 2 sprays into both nostrils as needed for congestion.    Marland Kitchen Spacer/Aero-Holding  Chambers (AEROCHAMBER MV) inhaler Use as instructed 1 each 0  . traMADol (ULTRAM) 50 MG tablet Take 50 mg by mouth every 8 (eight) hours as needed for moderate pain or severe pain.    . valsartan (DIOVAN) 160 MG tablet Take 1 tablet (160 mg total) by mouth at bedtime. (Patient taking differently: Take 160 mg by mouth 2 (two) times daily. ) 90 tablet 3   No current facility-administered medications for this visit.     Allergies:   Morphine; Prednisone; Gabapentin; Nsaids; and Penicillins    Social History:  The patient  reports that she has been smoking Cigarettes.  She  has a 7.50 pack-year smoking history. She has never used smokeless tobacco. She reports that she does not drink alcohol or use drugs.   Family History:  The patient's family history includes COPD in her father; Cancer in her father and mother; Colon cancer in her mother; Coronary artery disease in her mother; Heart attack (age of onset: 76) in her brother; Hypertension in her mother; Prostate cancer in her father; Sleep apnea in her brother.    ROS:  Please see the history of present illness.   Otherwise, review of systems are positive for none.   All other systems are reviewed and negative.    PHYSICAL EXAM: VS:  BP (!) 177/92 (BP Location: Left Arm, Patient Position: Sitting, Cuff Size: Large)   Pulse 72   Ht 5\' 1"  (1.549 m)   Wt 197 lb 4 oz (89.5 kg)   BMI 37.27 kg/m  , BMI Body mass index is 37.27 kg/m. GEN: Well nourished, well developed, in no acute distress  HEENT: normal  Neck: no JVD, carotid bruits, or masses Cardiac: RRR; no murmurs, rubs, or gallops,no edema  Respiratory:  clear to auscultation bilaterally, normal work of breathing GI: soft, nontender, nondistended, + BS MS: no deformity or atrophy  Skin: warm and dry, no rash Neuro:  Strength and sensation are intact Psych: euthymic mood, full affect Vascular: Femoral pulses are normal bilaterally. Dorsalis pedis is palpable on the right side and  left.  EKG:  EKG is not ordered today.    Recent Labs: 03/18/2017: BUN 21; Creatinine, Ser 1.60; Potassium 4.2; Sodium 137    Lipid Panel    Component Value Date/Time   CHOL 208 (A) 11/17/2016   TRIG 130 11/17/2016   HDL 71 (A) 11/17/2016   CHOLHDL 3 05/13/2015 1000   VLDL 22.0 05/13/2015 1000   LDLCALC 111 11/17/2016   LDLDIRECT 51.9 09/07/2012 1102      Wt Readings from Last 3 Encounters:  06/06/17 197 lb 4 oz (89.5 kg)  04/22/17 197 lb (89.4 kg)  03/29/17 190 lb (86.2 kg)        No flowsheet data found.    ASSESSMENT AND PLAN:  1.  Peripheral arterial disease: The patient has evidence of peripheral arterial disease with mildly reduced ABI bilaterally and high-grade stenosis in the left SFA. However, her leg pain seems to be multifactorial due to PAD, arthritis and previous lower back surgery. I discussed with her the natural history and management of claudication. I recommend continuing aggressive medical therapy and I discussed with her the importance of a walking program. I think angiography should be left as a last resort considering her chronic kidney disease. She does not want to take any chances with contrast-induced nephropathy.  2. Tobacco use: I had a prolonged discussion with her about the importance of smoking cessation and she is down to a few cigarettes a day.  3. Essential hypertension: Blood pressure is elevated today but she seems to be anxious. Continue to monitor closely.    Disposition:   FU with me in 6 months  Signed,  Kathlyn Sacramento, MD  06/06/2017 5:41 PM    [place]

## 2017-06-07 DIAGNOSIS — E1122 Type 2 diabetes mellitus with diabetic chronic kidney disease: Secondary | ICD-10-CM | POA: Diagnosis not present

## 2017-06-07 DIAGNOSIS — R768 Other specified abnormal immunological findings in serum: Secondary | ICD-10-CM | POA: Diagnosis not present

## 2017-06-07 DIAGNOSIS — D631 Anemia in chronic kidney disease: Secondary | ICD-10-CM | POA: Diagnosis not present

## 2017-06-07 DIAGNOSIS — I10 Essential (primary) hypertension: Secondary | ICD-10-CM | POA: Diagnosis not present

## 2017-06-07 DIAGNOSIS — Z524 Kidney donor: Secondary | ICD-10-CM | POA: Diagnosis not present

## 2017-06-07 DIAGNOSIS — R809 Proteinuria, unspecified: Secondary | ICD-10-CM | POA: Diagnosis not present

## 2017-06-07 DIAGNOSIS — E8809 Other disorders of plasma-protein metabolism, not elsewhere classified: Secondary | ICD-10-CM | POA: Diagnosis not present

## 2017-06-07 DIAGNOSIS — R319 Hematuria, unspecified: Secondary | ICD-10-CM | POA: Diagnosis not present

## 2017-06-07 DIAGNOSIS — N183 Chronic kidney disease, stage 3 (moderate): Secondary | ICD-10-CM | POA: Diagnosis not present

## 2017-06-07 LAB — BASIC METABOLIC PANEL
BUN: 23 — AB (ref 4–21)
Creatinine: 1.5 — AB (ref 0.5–1.1)
GLUCOSE: 331
POTASSIUM: 4.4 (ref 3.4–5.3)
SODIUM: 138 (ref 137–147)

## 2017-06-15 DIAGNOSIS — N183 Chronic kidney disease, stage 3 (moderate): Secondary | ICD-10-CM | POA: Diagnosis not present

## 2017-06-15 DIAGNOSIS — I129 Hypertensive chronic kidney disease with stage 1 through stage 4 chronic kidney disease, or unspecified chronic kidney disease: Secondary | ICD-10-CM | POA: Diagnosis not present

## 2017-06-15 DIAGNOSIS — E1122 Type 2 diabetes mellitus with diabetic chronic kidney disease: Secondary | ICD-10-CM | POA: Diagnosis not present

## 2017-06-15 DIAGNOSIS — R809 Proteinuria, unspecified: Secondary | ICD-10-CM | POA: Diagnosis not present

## 2017-06-15 DIAGNOSIS — R768 Other specified abnormal immunological findings in serum: Secondary | ICD-10-CM | POA: Diagnosis not present

## 2017-06-16 ENCOUNTER — Ambulatory Visit: Payer: Medicare Other | Admitting: Pulmonary Disease

## 2017-06-16 NOTE — Progress Notes (Deleted)
Subjective:    Patient ID: Andrea Stewart, female    DOB: 26-Oct-1946, 71 y.o.   MRN: [id number]  Synopsis: Referred in March 2016 for evaluation of ongoing shortness of breath. She had a CT scan performed at [facility] in 2015 which demonstrated tree-in-bud abnormalities in the right upper lobe as well as a left lower lobe pulmonary nodule. A repeat CT scan was performed in January 2016 showing a right lower lobe pleural-based pulmonary nodule 1.27 m in size. This resolved on a April 2016 CT chest.  11/20/2014 CT chest> tree in bud, inflammatory appearing abnormalities in the right upper lobe, left upper lobe pleural-based 1.2 cm solid nodule 02/2015 CT chest > right upper lobe tree-in-bud abnormalities persistent, the pulmonary nodule seen on the previous study has resolved, thyroid nodule stable 01/2015 PFT> normal ratio but consistent with moderate obstruction (FEV1 1.16L (58% pred)), TLC 4.57L (99% pred), RV 133% pred, DLCO 27.6 (136% pred) 2016 Bronch> AFB bal negative  HPI No chief complaint on file.  ***   Past Medical History:  Diagnosis Date  . Backache, unspecified    s/p c-spine and l-spine fusions  . Degeneration of intervertebral disc, site unspecified   . Depressive disorder, not elsewhere classified   . Disorder of bone and cartilage, unspecified   . GERD (gastroesophageal reflux disease)   . HTN (hypertension)   . Hx of left heart catheterization by cutdown    a. 7 years ago; no significant cad; b. Lexiscan 2014: no significant ischemia, no significant EKG changes concerning for ischemia, EF 67%, overall low risk study  . Myalgia and myositis, unspecified   . Obesity, unspecified   . Personal history of tobacco use, presenting hazards to health    1/2 ppd  . Plantar fascial fibromatosis   . S/P cholecystectomy   . S/p nephrectomy   . Type II or unspecified type diabetes mellitus without mention of complication, not stated as uncontrolled   . Unspecified essential  hypertension   . 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  HENT: Negative for postnasal drip, rhinorrhea and sinus pressure.   Respiratory: Positive for shortness of breath. Negative for cough and wheezing.   Cardiovascular: Negative for chest pain, palpitations and leg swelling.       Objective:   Physical Exam There were no vitals filed for this visit. RA  ***       Assessment & Plan:  No diagnosis found.  Discussion: ***    Current Outpatient Prescriptions:  .  albuterol (PROVENTIL HFA) 108 (90 BASE) MCG/ACT inhaler, Inhale 2 puffs into the lungs every 4 (four) hours as needed for wheezing or shortness of breath., Disp: 1 Inhaler, Rfl: 2 .  amLODipine (NORVASC) 10 MG tablet, Take one tablet (10mg )  daily, Disp: 90 tablet, Rfl: 3 .  aspirin 81 MG tablet, Take 1 tablet (81 mg total) by mouth daily., Disp: , Rfl:  .  bisacodyl (DULCOLAX) 10 MG suppository, Place 1 suppository (10 mg total) rectally as needed for moderate constipation., Disp: 12 suppository, Rfl: 0 .  budesonide-formoterol (SYMBICORT) 160-4.5 MCG/ACT inhaler, Inhale 2 puffs into the lungs 2 (two) times daily., Disp: 1 Inhaler, Rfl: 5 .  calcium carbonate (OS-CAL) 600 MG TABS, Take 600 mg by mouth daily.  , Disp: , Rfl:  .  carvedilol (COREG) 3.125 MG tablet, Take 3.125 mg by mouth 2 (two)  times daily with a meal. , Disp: , Rfl:  .  chlorpheniramine-HYDROcodone (TUSSIONEX PENNKINETIC ER) 10-8 MG/5ML SUER, Take 5 mLs by mouth every 12 (twelve) hours as needed., Disp: 115 mL, Rfl: 0 .  cloNIDine (CATAPRES) 0.1 MG tablet, Take 1 tablet by mouth 2 (two) times daily., Disp: , Rfl:  .  DAILY MULTIPLE VITAMINS PO, Take 1 tablet by mouth daily.  , Disp: , Rfl:  .  fluticasone (VERAMYST) 27.5 MCG/SPRAY nasal spray, Place 2 sprays into the nose as needed for rhinitis or allergies. , Disp: , Rfl:    .  furosemide (LASIX) 20 MG tablet, Take 20 mg by mouth daily., Disp: , Rfl:  .  furosemide (LASIX) 40 MG tablet, Take 40 mg by mouth daily as needed., Disp: , Rfl:  .  insulin lispro (HUMALOG) 100 UNIT/ML injection, Inject into the skin as directed. Sliding scale 100-150 = 2-2.5 units 150-200 = 3-3.5 units 200-250 = 4-4.5 units 251-300 = 5-5.5 units 301-350 = 6-6.5 units 350-400 = 7-7.5 units, Disp: , Rfl:  .  insulin NPH (HUMULIN N,NOVOLIN N) 100 UNIT/ML injection, Inject 20 Units into the skin daily before breakfast. 22 units at bedtime, Disp: , Rfl:  .  levofloxacin (LEVAQUIN) 750 MG tablet, Take 1 tablet (750 mg total) by mouth every other day., Disp: 5 tablet, Rfl: 0 .  metFORMIN (GLUCOPHAGE-XR) 500 MG 24 hr tablet, Take 500 mg by mouth 2 (two) times daily. , Disp: , Rfl:  .  mometasone (NASONEX) 50 MCG/ACT nasal spray, Place 2 sprays into the nose daily as needed. , Disp: , Rfl:  .  nitroGLYCERIN (NITROSTAT) 0.4 MG SL tablet, Place 1 tablet (0.4 mg total) under the tongue every 5 (five) minutes as needed. Maximum 3 tablets (Patient taking differently: Place 0.4 mg under the tongue every 5 (five) minutes as needed for chest pain. Maximum 3 tablets), Disp: 25 tablet, Rfl: 6 .  sodium chloride (OCEAN) 0.65 % SOLN nasal spray, Place 2 sprays into both nostrils as needed for congestion., Disp: , Rfl:  .  Spacer/Aero-Holding Chambers (AEROCHAMBER MV) inhaler, Use as instructed, Disp: 1 each, Rfl: 0 .  traMADol (ULTRAM) 50 MG tablet, Take 50 mg by mouth every 8 (eight) hours as needed for moderate pain or severe pain., Disp: , Rfl:  .  valsartan (DIOVAN) 160 MG tablet, Take 1 tablet (160 mg total) by mouth at bedtime. (Patient taking differently: Take 160 mg by mouth 2 (two) times daily. ), Disp: 90 tablet, Rfl: 3

## 2017-06-20 ENCOUNTER — Other Ambulatory Visit: Payer: Self-pay

## 2017-06-21 ENCOUNTER — Other Ambulatory Visit (INDEPENDENT_AMBULATORY_CARE_PROVIDER_SITE_OTHER): Payer: Medicare Other

## 2017-06-21 ENCOUNTER — Encounter (INDEPENDENT_AMBULATORY_CARE_PROVIDER_SITE_OTHER): Payer: Self-pay | Admitting: Vascular Surgery

## 2017-06-21 ENCOUNTER — Ambulatory Visit (INDEPENDENT_AMBULATORY_CARE_PROVIDER_SITE_OTHER): Payer: Medicare Other | Admitting: Vascular Surgery

## 2017-06-21 ENCOUNTER — Other Ambulatory Visit (INDEPENDENT_AMBULATORY_CARE_PROVIDER_SITE_OTHER): Payer: Self-pay | Admitting: Vascular Surgery

## 2017-06-21 VITALS — BP 181/90 | HR 75 | Resp 18 | Ht 61.0 in | Wt 200.0 lb

## 2017-06-21 DIAGNOSIS — N183 Chronic kidney disease, stage 3 unspecified: Secondary | ICD-10-CM

## 2017-06-21 DIAGNOSIS — I10 Essential (primary) hypertension: Secondary | ICD-10-CM | POA: Diagnosis not present

## 2017-06-21 DIAGNOSIS — F17210 Nicotine dependence, cigarettes, uncomplicated: Secondary | ICD-10-CM

## 2017-06-21 DIAGNOSIS — I2510 Atherosclerotic heart disease of native coronary artery without angina pectoris: Secondary | ICD-10-CM

## 2017-06-21 DIAGNOSIS — F17200 Nicotine dependence, unspecified, uncomplicated: Secondary | ICD-10-CM

## 2017-06-21 DIAGNOSIS — I739 Peripheral vascular disease, unspecified: Secondary | ICD-10-CM

## 2017-06-21 DIAGNOSIS — I2583 Coronary atherosclerosis due to lipid rich plaque: Secondary | ICD-10-CM | POA: Diagnosis not present

## 2017-06-21 DIAGNOSIS — I70212 Atherosclerosis of native arteries of extremities with intermittent claudication, left leg: Secondary | ICD-10-CM

## 2017-06-21 DIAGNOSIS — E1165 Type 2 diabetes mellitus with hyperglycemia: Secondary | ICD-10-CM | POA: Diagnosis not present

## 2017-06-21 DIAGNOSIS — IMO0002 Reserved for concepts with insufficient information to code with codable children: Secondary | ICD-10-CM

## 2017-06-21 DIAGNOSIS — Z794 Long term (current) use of insulin: Secondary | ICD-10-CM | POA: Diagnosis not present

## 2017-06-21 DIAGNOSIS — E118 Type 2 diabetes mellitus with unspecified complications: Secondary | ICD-10-CM

## 2017-06-21 DIAGNOSIS — I70219 Atherosclerosis of native arteries of extremities with intermittent claudication, unspecified extremity: Secondary | ICD-10-CM | POA: Insufficient documentation

## 2017-06-21 NOTE — Patient Instructions (Signed)

## 2017-06-21 NOTE — Assessment & Plan Note (Signed)
Noninvasive studies were performed today demonstrating a right ABI of 0.96 and a left ABI of 0.75. She has triphasic flow distally on the right but only monophasic flow distally on the left. This is consistent with the previous study suggesting left superficial femoral artery stenosis. We had a long discussion today about the pathophysiology and natural history of peripheral arterial disease. Given her symptoms, consideration for intervention would certainly be reasonable but continue medical therapy and increasing her exercise regimen with a stress on smoking cessation would also be a good option and one that I would prefer. She is agreeable with this plan of care. I have recommended she be on a statin agent long-term and she is going to discuss that with her primary care physician as well. I'll plan to see her back in 4-6 months with noninvasive studies.

## 2017-06-21 NOTE — Progress Notes (Signed)
Patient ID: Andrea Stewart, female   DOB: [birth year], 71 y.o.   MRN: [id number]  Chief Complaint  Patient presents with  . New Patient (Initial Visit)    ABI consult ref    HPI Andrea Stewart is a 71 y.o. female.  I am asked to see the patient by Dr. Juleen China for evaluation of PAD.  The patient reports cramping in her legs with short distances of walking. This is worse in the left leg from the right. She does not have ischemic rest pain, she does not have ulceration, and she does not have infection. She does have some swelling there is likely a combination of multiple medical issues and chronic kidney disease. Her right leg has more swelling left leg. She has been previously seen by cardiologist and placed on appropriate medical therapy for her peripheral arterial disease. She continues to smoke although she says she has cut back. Noninvasive studies were performed today demonstrating a right ABI of 0.96 and a left ABI of 0.75. She has triphasic flow distally on the right but only monophasic flow distally on the left. This is consistent with the previous study suggesting left superficial femoral artery stenosis.   Past Medical History:  Diagnosis Date  . Backache, unspecified    s/p c-spine and l-spine fusions  . Degeneration of intervertebral disc, site unspecified   . Depressive disorder, not elsewhere classified   . Disorder of bone and cartilage, unspecified   . GERD (gastroesophageal reflux disease)   . HTN (hypertension)   . Hx of left heart catheterization by cutdown    a. 7 years ago; no significant cad; b. Lexiscan 2014: no significant ischemia, no significant EKG changes concerning for ischemia, EF 67%, overall low risk study  . Myalgia and myositis, unspecified   . Obesity, unspecified   . Personal history of tobacco use, presenting hazards to health    1/2 ppd  . Plantar fascial fibromatosis   . S/P cholecystectomy   . S/p nephrectomy   . Type II or unspecified  type diabetes mellitus without mention of complication, not stated as uncontrolled   . Unspecified essential hypertension   . Unspecified hereditary and idiopathic peripheral neuropathy    secondary to diabetes    Past Surgical History:  Procedure Laterality Date  . ABDOMINAL HYSTERECTOMY    . APPENDECTOMY    . BACK SURGERY     2006  . CARDIAC CATHETERIZATION     o.k. 2003  . CARPAL TUNNEL RELEASE    . CERVICAL DISCECTOMY    . CHOLECYSTECTOMY    . COLONOSCOPY  09/1999   colonoscopy-hemorrhoids, re-check 5 years (10/2006)  . dexa     osteopenia (01/2004)  . ELBOW SURGERY    . FOOT SURGERY     x 3  . KIDNEY DONATION  1991  . LUMBAR FUSION    . NECK SURGERY     06/2005  . Problem with general Anesthesia     02/2006  . REPLACEMENT TOTAL KNEE  10/2004  . trigger finger surgery    . VESICOVAGINAL FISTULA CLOSURE W/ TAH    . VIDEO BRONCHOSCOPY Bilateral 04/02/2015   Procedure: VIDEO BRONCHOSCOPY WITHOUT FLUORO;  Surgeon: Juanito Doom, MD;  Location: [facility];  Service: Cardiopulmonary;  Laterality: Bilateral;    Family History  Problem Relation Age of Onset  . Hypertension Mother   . Colon cancer Mother   . Cancer Mother        colon  .  Coronary artery disease Mother   . Prostate cancer Father   . COPD Father   . Cancer Father        prostate  . Heart attack Brother 35  . Sleep apnea Brother     Social History Social History  Substance Use Topics  . Smoking status: Current Every Day Smoker    Packs/day: 0.25    Years: 30.00    Types: Cigarettes  . Smokeless tobacco: Never Used     Comment: currently smoking .25 ppd, trying to quit 02/13/16  . Alcohol use No  No IVDU  Allergies  Allergen Reactions  . Morphine Anaphylaxis  . Prednisone Other (See Comments)    "makes me think I am having a heart attack"  . Gabapentin Other (See Comments)    intolerance  . Nsaids Diarrhea  . Penicillins Other (See Comments)    REACTION: rash, swelling. Remote  reaction as a child to [place]. Patient did tolerate recent course of augmentin     Current Outpatient Prescriptions  Medication Sig Dispense Refill  . albuterol (PROVENTIL HFA) 108 (90 BASE) MCG/ACT inhaler Inhale 2 puffs into the lungs every 4 (four) hours as needed for wheezing or shortness of breath. 1 Inhaler 2  . amLODipine (NORVASC) 10 MG tablet Take one tablet (10mg )  daily 90 tablet 3  . aspirin 81 MG tablet Take 1 tablet (81 mg total) by mouth daily.    . bisacodyl (DULCOLAX) 10 MG suppository Place 1 suppository (10 mg total) rectally as needed for moderate constipation. 12 suppository 0  . budesonide-formoterol (SYMBICORT) 160-4.5 MCG/ACT inhaler Inhale 2 puffs into the lungs 2 (two) times daily. 1 Inhaler 5  . calcium carbonate (OS-CAL) 600 MG TABS Take 600 mg by mouth daily.      . carvedilol (COREG) 3.125 MG tablet Take 3.125 mg by mouth 2 (two) times daily with a meal.     . chlorpheniramine-HYDROcodone (TUSSIONEX PENNKINETIC ER) 10-8 MG/5ML SUER Take 5 mLs by mouth every 12 (twelve) hours as needed. 115 mL 0  . cloNIDine (CATAPRES) 0.1 MG tablet Take 1 tablet by mouth 2 (two) times daily.    Marland Kitchen DAILY MULTIPLE VITAMINS PO Take 1 tablet by mouth daily.      . fluticasone (VERAMYST) 27.5 MCG/SPRAY nasal spray Place 2 sprays into the nose as needed for rhinitis or allergies.     . furosemide (LASIX) 20 MG tablet Take 20 mg by mouth daily.    . furosemide (LASIX) 40 MG tablet Take 40 mg by mouth daily as needed.    . insulin lispro (HUMALOG) 100 UNIT/ML injection Inject into the skin as directed. Sliding scale 100-150 = 2-2.5 units 150-200 = 3-3.5 units 200-250 = 4-4.5 units 251-300 = 5-5.5 units 301-350 = 6-6.5 units 350-400 = 7-7.5 units    . insulin NPH (HUMULIN N,NOVOLIN N) 100 UNIT/ML injection Inject 20 Units into the skin daily before breakfast. 22 units at bedtime    . levofloxacin (LEVAQUIN) 750 MG tablet Take 1 tablet (750 mg total) by mouth every other day. 5 tablet 0    . metFORMIN (GLUCOPHAGE-XR) 500 MG 24 hr tablet Take 500 mg by mouth 2 (two) times daily.     . mometasone (NASONEX) 50 MCG/ACT nasal spray Place 2 sprays into the nose daily as needed.     . nitroGLYCERIN (NITROSTAT) 0.4 MG SL tablet Place 1 tablet (0.4 mg total) under the tongue every 5 (five) minutes as needed. Maximum 3 tablets (Patient taking  differently: Place 0.4 mg under the tongue every 5 (five) minutes as needed for chest pain. Maximum 3 tablets) 25 tablet 6  . sodium chloride (OCEAN) 0.65 % SOLN nasal spray Place 2 sprays into both nostrils as needed for congestion.    Marland Kitchen Spacer/Aero-Holding Chambers (AEROCHAMBER MV) inhaler Use as instructed 1 each 0  . traMADol (ULTRAM) 50 MG tablet Take 50 mg by mouth every 8 (eight) hours as needed for moderate pain or severe pain.    . valsartan (DIOVAN) 160 MG tablet Take 1 tablet (160 mg total) by mouth at bedtime. (Patient taking differently: Take 160 mg by mouth 2 (two) times daily. ) 90 tablet 3   No current facility-administered medications for this visit.       REVIEW OF SYSTEMS (Negative unless checked)  Constitutional: [] Weight loss  [] Fever  [] Chills Cardiac: [] Chest pain   [] Chest pressure   [] Palpitations   [] Shortness of breath when laying flat   [] Shortness of breath at rest   [x] Shortness of breath with exertion. Vascular:  [] Pain in legs with walking   [] Pain in legs at rest   [] Pain in legs when laying flat   [] Claudication   [] Pain in feet when walking  [] Pain in feet at rest  [] Pain in feet when laying flat   [] History of DVT   [] Phlebitis   [x] Swelling in legs   [] Varicose veins   [] Non-healing ulcers Pulmonary:   [] Uses home oxygen   [] Productive cough   [] Hemoptysis   [] Wheeze  [] COPD   [] Asthma Neurologic:  [] Dizziness  [] Blackouts   [] Seizures   [] History of stroke   [] History of TIA  [] Aphasia   [] Temporary blindness   [] Dysphagia   [] Weakness or numbness in arms   [] Weakness or numbness in legs Musculoskeletal:   [x] Arthritis   [] Joint swelling   [] Joint pain   [x] Low back pain Hematologic:  [x] Easy bruising  [] Easy bleeding   [] Hypercoagulable state   [] Anemic  [] Hepatitis Gastrointestinal:  [] Blood in stool   [] Vomiting blood  [x] Gastroesophageal reflux/heartburn   [] Abdominal pain Genitourinary:  [x] Chronic kidney disease   [] Difficult urination  [] Frequent urination  [] Burning with urination   [] Hematuria Skin:  [] Rashes   [] Ulcers   [] Wounds Psychological:  [] History of anxiety   []  History of major depression.    Physical Exam BP (!) 181/90 (BP Location: Left Arm)   Pulse 75   Resp 18   Ht 5\' 1"  (1.549 m)   Wt 200 lb (90.7 kg)   BMI 37.79 kg/m  Gen:  WD/WN, NAD. Obese  Head: [place]/AT, No temporalis wasting.  Ear/Nose/Throat: Hearing grossly intact, nares w/o erythema or drainage, oropharynx w/o Erythema/Exudate Eyes: Conjunctiva clear, sclera non-icteric  Neck: trachea midline.  No JVD.  Pulmonary:  Good air movement, respirations not labored, no use of accessory muscles Cardiac: RRR, normal S1, S2 Vascular:  Vessel Right Left  Radial Palpable Palpable                      Popliteal Palpable Not Palpable  PT Palpable 1+ Palpable  DP Palpable 1+ Palpable   Gastrointestinal: soft, non-tender/non-distended.  Musculoskeletal: M/S 5/5 throughout.  Extremities without ischemic changes.  No deformity or atrophy. 2+ right lower extremity edema, 1+ left lower extremity edema. Neurologic: Sensation grossly intact in extremities.  Symmetrical.  Speech is fluent. Motor exam as listed above. Psychiatric: Judgment intact, Mood & affect appropriate for pt's clinical situation. Dermatologic: No rashes or ulcers noted.  No cellulitis  or open wounds.    Radiology No results found.  Labs Recent Results (from the past 2160 hour(s))  VAS [place] LOWER EXTREMITY ARTERIAL DUPLEX     Status: None   Collection Time: 05/26/17 11:54 AM  Result Value Ref Range   Left super femoral prox sys PSV 88 cm/s     Left super femoral mid sys PSV -560 cm/s   Left super femoral dist sys PSV -221 cm/s   Left popliteal prox sys PSV 76 cm/s   Right peroneal sys min 0 m/s   Right super femoral prox sys PSV 145 cm/s   Right super femoral mid sys PSV -137 cm/s   Right super femoral dist sys PSV -128 cm/s   Right popliteal prox sys PSV 178 cm/s   Right ant tibeal sys PSV 80 cm/s   Right peroneal sys PSV 35 cm/s   left post tibial dist sys 44 cm/s   left post tibial dist dia 0 cm/s   LEFT PERO DIST DIA 0 cm/s   LEFT PERO DIST SYS 46.00 cm/s   RIGHT POST TIB DIST SYS 32 cm/s   RIGHT POST TIB DIST DIA 6 cm/s   LEFT SFA PROX DYS VEL 0 cm/s   LEFT SFA DIST DYS VEL 0 cm/s   LEFT POPLITEAL PROX DYS VEL 8 cm/s   RIGHT SUPER FEMORAL PROX EDV 0 cm/sec   RIGHT SUPER FEMORAL MID EDV 0 cm/sec   RIGHT SUPER FEMORAL DIST EDV 0 cm/sec   RIGHT POPLITEAL PROX EDV 0 cm/sec   RIGHT ANT TIBIAL EDV 0 cm/sec  Basic metabolic panel     Status: Abnormal   Collection Time: 06/07/17 12:00 AM  Result Value Ref Range   Glucose 331    BUN 23 (A) 4 - 21   Creatinine 1.5 (A) 0.5 - 1.1   Potassium 4.4 3.4 - 5.3   Sodium 138 137 - 147    Assessment/Plan:  Essential hypertension blood pressure control important in reducing the progression of atherosclerotic disease. On appropriate oral medications.   Diabetes mellitus type 2 with complications, uncontrolled blood glucose control important in reducing the progression of atherosclerotic disease. Also, involved in wound healing. On appropriate medications.   CKD (chronic kidney disease), stage III Would plan limited contrast used with any intervention.  Tobacco use disorder We had a discussion for approximately 3 minutes regarding the absolute need for smoking cessation due to the deleterious nature of tobacco on the vascular system. We discussed the tobacco use would diminish patency of any intervention, and likely significantly worsen progressio of disease. We discussed  multiple agents for quitting including replacement therapy or medications to reduce cravings such as Chantix. The patient voices their understanding of the importance of smoking cessation.   Atherosclerosis of native arteries of extremity with intermittent claudication (HCC) Noninvasive studies were performed today demonstrating a right ABI of 0.96 and a left ABI of 0.75. She has triphasic flow distally on the right but only monophasic flow distally on the left. This is consistent with the previous study suggesting left superficial femoral artery stenosis. We had a long discussion today about the pathophysiology and natural history of peripheral arterial disease. Given her symptoms, consideration for intervention would certainly be reasonable but continue medical therapy and increasing her exercise regimen with a stress on smoking cessation would also be a good option and one that I would prefer. She is agreeable with this plan of care. I have recommended she be on a statin agent long-term  and she is going to discuss that with her primary care physician as well. I'll plan to see her back in 4-6 months with noninvasive studies.      Leotis Pain 06/21/2017, 4:08 PM   This note was created with Dragon medical transcription system.  Any errors from dictation are unintentional.

## 2017-06-21 NOTE — Assessment & Plan Note (Signed)
blood glucose control important in reducing the progression of atherosclerotic disease. Also, involved in wound healing. On appropriate medications.  

## 2017-06-21 NOTE — Assessment & Plan Note (Signed)

## 2017-06-21 NOTE — Assessment & Plan Note (Signed)
Would plan limited contrast used with any intervention.

## 2017-06-21 NOTE — Assessment & Plan Note (Signed)
blood pressure control important in reducing the progression of atherosclerotic disease. On appropriate oral medications.  

## 2017-06-22 ENCOUNTER — Telehealth: Payer: Self-pay | Admitting: Internal Medicine

## 2017-06-22 NOTE — Telephone Encounter (Signed)
There is not any appts available until Sept 26th other than the Monday evening appts or the 11:30, 4:30 spots. Where would you like for her to be scheduled or do you think it is ok for her to wait until the end of sept?

## 2017-06-22 NOTE — Telephone Encounter (Signed)
Pt called and stated that nephrologist agreed to have pt start statin. Pt needs to come in for an appt. Next available is not until mid September. Pt states that she needs to come in sooner. Please advise, thank you!  Call pt @ [phone]

## 2017-06-23 NOTE — Telephone Encounter (Signed)
She is in

## 2017-06-23 NOTE — Telephone Encounter (Signed)
Can you schedule pt for 07/01/2017 @ 4:30pm per Dr. Derrel Nip. Pt is aware of appt date and time.

## 2017-06-23 NOTE — Telephone Encounter (Signed)
You can use an urgent slot

## 2017-06-23 NOTE — Telephone Encounter (Signed)
Thank you :)

## 2017-07-01 ENCOUNTER — Encounter: Payer: Self-pay | Admitting: Internal Medicine

## 2017-07-01 ENCOUNTER — Ambulatory Visit (INDEPENDENT_AMBULATORY_CARE_PROVIDER_SITE_OTHER): Payer: Medicare Other | Admitting: Internal Medicine

## 2017-07-01 VITALS — BP 166/72 | HR 75 | Temp 98.5°F | Resp 15 | Ht 61.0 in | Wt 203.0 lb

## 2017-07-01 DIAGNOSIS — F17200 Nicotine dependence, unspecified, uncomplicated: Secondary | ICD-10-CM | POA: Diagnosis not present

## 2017-07-01 DIAGNOSIS — Z794 Long term (current) use of insulin: Secondary | ICD-10-CM | POA: Diagnosis not present

## 2017-07-01 DIAGNOSIS — E1165 Type 2 diabetes mellitus with hyperglycemia: Secondary | ICD-10-CM

## 2017-07-01 DIAGNOSIS — IMO0002 Reserved for concepts with insufficient information to code with codable children: Secondary | ICD-10-CM

## 2017-07-01 DIAGNOSIS — I10 Essential (primary) hypertension: Secondary | ICD-10-CM | POA: Diagnosis not present

## 2017-07-01 DIAGNOSIS — E118 Type 2 diabetes mellitus with unspecified complications: Secondary | ICD-10-CM

## 2017-07-01 DIAGNOSIS — R609 Edema, unspecified: Secondary | ICD-10-CM

## 2017-07-01 DIAGNOSIS — I70212 Atherosclerosis of native arteries of extremities with intermittent claudication, left leg: Secondary | ICD-10-CM | POA: Diagnosis not present

## 2017-07-01 MED ORDER — CARVEDILOL 6.25 MG PO TABS: 6.2500 mg | ORAL_TABLET | Freq: Two times a day (BID) | ORAL | 3 refills | Status: DC

## 2017-07-01 MED ORDER — TORSEMIDE 5 MG PO TABS: 5.0000 mg | ORAL_TABLET | Freq: Every day | ORAL | 2 refills | Status: DC

## 2017-07-01 MED ORDER — ATORVASTATIN CALCIUM 20 MG PO TABS: 20.0000 mg | ORAL_TABLET | Freq: Every day | ORAL | 0 refills | Status: DC

## 2017-07-01 MED ORDER — METOLAZONE 2.5 MG PO TABS: 2.5000 mg | ORAL_TABLET | Freq: Every day | ORAL | 2 refills | Status: DC

## 2017-07-01 NOTE — Progress Notes (Signed)
Subjective:  Patient ID: Andrea Stewart, female    DOB: 1946/02/11  Age: 71 y.o. MRN: [id number]  CC: The primary encounter diagnosis was Essential hypertension. 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  HPI Andrea Stewart presents for follow up on hypertension , COPD and GAD   Seen in May by ES for AKI  In the setting pf pneumonia (community acquired)   Cr Rose to 1.8 improved to 1.5 in late July and rechecked in early august by neprhology 1.53   Sent to Vascular Surgery for claudication symptoms suggestive of PAD .  ABI 0.75 in left side advised to stop smoking and start statin therapy  Has reduced cigs to 4 daily ,  Gaining weight but states the is not eating more..  Not exercising de to symptomatic COPD   Fluid retention  Noted   She is taking 40 mg lasix which makes her dehydrated and has recurrent frequent leg cramping   at night .   Has tried quinine,  Mustard,   Has 2 + Pitting edema  Right swells more than left.  Gets hives with compression stockings at the top due to skin allergy to the band.   diovan stopped and irbesartan 150  Mg started last week      Outpatient Medications Prior to Visit  Medication Sig Dispense Refill  . albuterol (PROVENTIL HFA) 108 (90 BASE) MCG/ACT inhaler Inhale 2 puffs into the lungs every 4 (four) hours as needed for wheezing or shortness of breath. 1 Inhaler 2  . amLODipine (NORVASC) 10 MG tablet Take one tablet (10mg )  daily 90 tablet 3  . aspirin 81 MG tablet Take 1 tablet (81 mg total) by mouth daily.    . bisacodyl (DULCOLAX) 10 MG suppository Place 1 suppository (10 mg total) rectally as needed for moderate constipation. 12 suppository 0  . budesonide-formoterol  (SYMBICORT) 160-4.5 MCG/ACT inhaler Inhale 2 puffs into the lungs 2 (two) times daily. 1 Inhaler 5  . calcium carbonate (OS-CAL) 600 MG TABS Take 600 mg by mouth daily.      . chlorpheniramine-HYDROcodone (TUSSIONEX PENNKINETIC ER) 10-8 MG/5ML SUER Take 5 mLs by mouth every 12 (twelve) hours as needed. 115 mL 0  . cloNIDine (CATAPRES) 0.1 MG tablet Take 1 tablet by mouth 2 (two) times daily.    Marland Kitchen DAILY MULTIPLE VITAMINS PO Take 1 tablet by mouth daily.      . fluticasone (VERAMYST) 27.5 MCG/SPRAY nasal spray Place 2 sprays into the nose as needed for rhinitis or allergies.     Marland Kitchen insulin lispro (HUMALOG) 100 UNIT/ML injection Inject into the skin as directed. Sliding scale 100-150 = 2-2.5 units 150-200 = 3-3.5 units 200-250 = 4-4.5 units 251-300 = 5-5.5 units 301-350 = 6-6.5 units 350-400 = 7-7.5 units    . insulin NPH (HUMULIN N,NOVOLIN N) 100 UNIT/ML injection Inject 20 Units into the skin daily before breakfast. 22 units at bedtime    . metFORMIN (GLUCOPHAGE-XR) 500 MG 24 hr tablet Take 500 mg by mouth 2 (two) times daily.     . mometasone (NASONEX) 50 MCG/ACT nasal spray Place 2 sprays into the nose daily as needed.     . nitroGLYCERIN (NITROSTAT) 0.4 MG SL tablet Place 1 tablet (0.4 mg total) under the tongue every  5 (five) minutes as needed. Maximum 3 tablets (Patient taking differently: Place 0.4 mg under the tongue every 5 (five) minutes as needed for chest pain. Maximum 3 tablets) 25 tablet 6  . sodium chloride (OCEAN) 0.65 % SOLN nasal spray Place 2 sprays into both nostrils as needed for congestion.    Marland Kitchen Spacer/Aero-Holding Chambers (AEROCHAMBER MV) inhaler Use as instructed 1 each 0  . traMADol (ULTRAM) 50 MG tablet Take 50 mg by mouth every 8 (eight) hours as needed for moderate pain or severe pain.    . carvedilol (COREG) 3.125 MG tablet Take 3.125 mg by mouth 2 (two) times daily with a meal.     . furosemide (LASIX) 20 MG tablet Take 20 mg by mouth daily.    . furosemide (LASIX)  40 MG tablet Take 40 mg by mouth daily as needed.    Marland Kitchen levofloxacin (LEVAQUIN) 750 MG tablet Take 1 tablet (750 mg total) by mouth every other day. 5 tablet 0  . valsartan (DIOVAN) 160 MG tablet Take 1 tablet (160 mg total) by mouth at bedtime. (Patient not taking: Reported on 07/01/2017) 90 tablet 3   No facility-administered medications prior to visit.     Review of Systems;  Patient denies headache, fevers, malaise, unintentional weight loss, skin rash, eye pain, sinus congestion and sinus pain, sore throat, dysphagia,  hemoptysis , cough, dyspnea, wheezing, chest pain, palpitations, orthopnea, edema, abdominal pain, nausea, melena, diarrhea, constipation, flank pain, dysuria, hematuria, urinary  Frequency, nocturia, numbness, tingling, seizures,  Focal weakness, Loss of consciousness,  Tremor, insomnia, depression, anxiety, and suicidal ideation.      Objective:  BP (!) 166/72 (BP Location: Left Arm, Patient Position: Sitting, Cuff Size: Large)   Pulse 75   Temp 98.5 F (36.9 C) (Oral)   Resp 15   Ht 5\' 1"  (1.549 m)   Wt 203 lb (92.1 kg)   SpO2 94%   BMI 38.36 kg/m   BP Readings from Last 3 Encounters:  07/01/17 (!) 166/72  06/21/17 (!) 181/90  06/06/17 (!) 177/92    Wt Readings from Last 3 Encounters:  07/01/17 203 lb (92.1 kg)  06/21/17 200 lb (90.7 kg)  06/06/17 197 lb 4 oz (89.5 kg)    General appearance: alert, cooperative and appears stated age Ears: normal TM's and external ear canals both ears Throat: lips, mucosa, and tongue normal; teeth and gums normal Neck: no adenopathy, no carotid bruit, supple, symmetrical, trachea midline and thyroid not enlarged, symmetric, no tenderness/mass/nodules Back: symmetric, no curvature. ROM normal. No CVA tenderness. Lungs: clear to auscultation bilaterally Heart: regular rate and rhythm, S1, S2 normal, no murmur, click, rub or gallop Abdomen: soft, non-tender; bowel sounds normal; no masses,  no organomegaly Pulses: 2+ and  symmetric Skin: Skin color, texture, turgor normal. No rashes or lesions Lymph nodes: Cervical, supraclavicular, and axillary nodes normal.  Lab Results  Component Value Date   HGBA1C 8.9 (A) 01/01/2014   HGBA1C 9.1 (H) 07/30/2011    Lab Results  Component Value Date   CREATININE 1.5 (A) 06/07/2017   CREATININE 1.60 (H) 03/18/2017   CREATININE 1.81 (H) 03/16/2017    Lab Results  Component Value Date   WBC 6.6 07/19/2015   HGB 12.2 07/19/2015   HCT 36.8 07/19/2015   PLT 197 07/19/2015   GLUCOSE 319 (H) 03/18/2017   CHOL 208 (A) 11/17/2016   TRIG 130 11/17/2016   HDL 71 (A) 11/17/2016   LDLDIRECT 51.9 09/07/2012   LDLCALC 111 11/17/2016  ALT 19 07/19/2015   AST 30 07/19/2015   NA 138 06/07/2017   K 4.4 06/07/2017   CL 106 03/18/2017   CREATININE 1.5 (A) 06/07/2017   BUN 23 (A) 06/07/2017   CO2 25 03/18/2017   TSH 1.35 02/25/2014   INR 1.01 03/27/2015   HGBA1C 8.9 (A) 01/01/2014    Ct Chest Lung Cancer Screening Low Dose Wo Contrast  Result Date: 03/29/2017 CLINICAL DATA:  71 year old female current smoker, with 31 pack-year history of smoking, for follow-up lung cancer screening EXAM: CT CHEST WITHOUT CONTRAST LOW-DOSE FOR LUNG CANCER SCREENING TECHNIQUE: Multidetector CT imaging of the chest was performed following the standard protocol without IV contrast. COMPARISON:  Low-dose lung cancer screening CT chest dated 01/02/2016 FINDINGS: Cardiovascular: Heart is normal in size.  No pericardial effusion. Coronary atherosclerosis in the LAD and right coronary artery. No evidence of thoracic aortic aneurysm. Mild atherosclerotic calcifications of the aortic arch. Mediastinum/Nodes: No suspicious mediastinal lymphadenopathy. Visualized thyroid is unremarkable. Lungs/Pleura: 3.8 mm nodule in the posterior right upper lobe (series 3/image 39). 4.4 mm subpleural nodule in the lingula. Scattered areas of linear scarring/atelectasis. No focal consolidation. No pleural effusion or  pneumothorax. Upper Abdomen: Visualized upper abdomen is grossly unremarkable. Musculoskeletal: Degenerative changes of the visualized thoracolumbar spine. Cervical spine fixation hardware. IMPRESSION: Lung-RADS 2, benign appearance or behavior. Continue annual screening with low-dose chest CT without contrast in 12 months. Electronically Signed   By: Julian Hy M.D.   On: 03/29/2017 16:11    Assessment & Plan:   Problem List Items Addressed This Visit    Atherosclerosis of native arteries of extremity with intermittent claudication ([place])    With left leg PAD  ABI 0.75.  Starting atorvastatin 20 mg daily. Return for fasting lipids and statin advised       Diabetes mellitus type 2 with complications, uncontrolled ([place])    Managed by Endocrine for years due to uncontrolled status.        Relevant Medications   irbesartan (AVAPRO) 150 MG tablet   atorvastatin (LIPITOR) 20 MG tablet   Edema    Multifactorial ,  With 1+ pitting edema  To LE's noted.  No improvement with furosemide. The  edema is diffuse and includes an increase in abdominal girth.  Trial of metolazone and torsemide. ADVISED to got to [place] for fitting of compression stockings.       Essential hypertension - Primary    Not at goal on irbesartan 150 mg and carvedilol. 3.125 mg bid .  Increasing coreg dose to 6.25 mg bid.       Relevant Medications   irbesartan (AVAPRO) 150 MG tablet   torsemide (DEMADEX) 5 MG tablet   metolazone (ZAROXOLYN) 2.5 MG tablet   atorvastatin (LIPITOR) 20 MG tablet   carvedilol (COREG) 6.25 MG tablet   Other Relevant Orders   Comprehensive metabolic panel   Morbid obesity ([place])    I have addressed  BMI and recommended wt loss of 10% of body weigh over the next 6 months using a low glycemic index diet and regular exercise a minimum of 5 days per week.        Tobacco use disorder    She has reduced her use to 4 cigarettes daily       A total of 40 minutes was spent  with patient more than half of which was spent in counseling patient on the above mentioned issues , reviewing and explaining recent labs and imaging studies done, and coordination  of care.   I have discontinued Ms. Niemeier's valsartan, levofloxacin, furosemide, and furosemide. I have also changed her carvedilol. Additionally, I am having her start on torsemide, metolazone, and atorvastatin. Lastly, I am having her maintain her calcium carbonate, DAILY MULTIPLE VITAMINS PO, insulin lispro, fluticasone, mometasone, insulin NPH Human, aspirin, nitroGLYCERIN, metFORMIN, sodium chloride, traMADol, albuterol, AEROCHAMBER MV, bisacodyl, amLODipine, budesonide-formoterol, cloNIDine, chlorpheniramine-HYDROcodone, and irbesartan.  Meds ordered this encounter  Medications  . irbesartan (AVAPRO) 150 MG tablet  . torsemide (DEMADEX) 5 MG tablet    Sig: Take 1 tablet (5 mg total) by mouth daily.    Dispense:  30 tablet    Refill:  2  . metolazone (ZAROXOLYN) 2.5 MG tablet    Sig: Take 1 tablet (2.5 mg total) by mouth daily. 30 mintues prior to torsemide    Dispense:  30 tablet    Refill:  2  . atorvastatin (LIPITOR) 20 MG tablet    Sig: Take 1 tablet (20 mg total) by mouth daily.    Dispense:  90 tablet    Refill:  0  . carvedilol (COREG) 6.25 MG tablet    Sig: Take 1 tablet (6.25 mg total) by mouth 2 (two) times daily with a meal.    Dispense:  60 tablet    Refill:  3    Medications Discontinued During This Encounter  Medication Reason  . valsartan (DIOVAN) 160 MG tablet Patient has not taken in last 30 days  . levofloxacin (LEVAQUIN) 750 MG tablet   . furosemide (LASIX) 40 MG tablet   . furosemide (LASIX) 20 MG tablet   . carvedilol (COREG) 3.125 MG tablet Reorder    Follow-up: Return in about 3 months (around 10/01/2017) for 3 wks labs nonfasting .   Crecencio Mc, MD

## 2017-07-01 NOTE — Patient Instructions (Addendum)
I recommend that you go to [facility] to get a new pair of compression stockings   I am Starting you on  atorvastatin  20 mg for cholesterol  Your blood pressure is still too high.  Please Increase the carvedilol to 2 tablets in the am and 2 in the pm    I am changing your fluid pill from furosemide to to metolazone and torsemide  to see if we can get more fluid off   Take the metolazone 30 minutes prior to the torsemide to help it work better  Return for non fasting labs in 3-4 weeks   We will repeat your lipids  In 6 weeks to 3 months.  Ask your endocrinologist or your nephrologist  To draw the lipid panel

## 2017-07-03 NOTE — Assessment & Plan Note (Addendum)
Multifactorial ,  With 1+ pitting edema  To LE's noted.  No improvement with furosemide. The  edema is diffuse and includes an increase in abdominal girth.  Trial of metolazone and torsemide. ADVISED to got to [place] for fitting of compression stockings.

## 2017-07-03 NOTE — Assessment & Plan Note (Addendum)
With left leg PAD  ABI 0.75.  Starting atorvastatin 20 mg daily. Return for fasting lipids and statin advised

## 2017-07-03 NOTE — Assessment & Plan Note (Signed)
Not at goal on irbesartan 150 mg and carvedilol. 3.125 mg bid .  Increasing coreg dose to 6.25 mg bid.

## 2017-07-03 NOTE — Assessment & Plan Note (Signed)
She has reduced her use to 4 cigarettes daily

## 2017-07-03 NOTE — Assessment & Plan Note (Signed)
I have addressed  BMI and recommended wt loss of 10% of body weigh over the next 6 months using a low glycemic index diet and regular exercise a minimum of 5 days per week.   

## 2017-07-03 NOTE — Assessment & Plan Note (Signed)
Managed by Endocrine for years due to uncontrolled status.

## 2017-07-13 ENCOUNTER — Telehealth: Payer: Self-pay | Admitting: Internal Medicine

## 2017-07-13 DIAGNOSIS — N183 Chronic kidney disease, stage 3 unspecified: Secondary | ICD-10-CM

## 2017-07-13 NOTE — Telephone Encounter (Signed)
I will not be available to view the results until I return from my trip so there is no point in coming in before unless she feels that the torsemide is causing too much fluid loss ,  fi she feels it is the culprit, then have her come in tomorrow and let dr scott review the lab

## 2017-07-13 NOTE — Telephone Encounter (Signed)
Spoke with pt and informed her that she would need to suspend one medication at a time with the first one being the carvedilol for one week. The pt stated that she takes it morning and evening and wanted to know if she would need to suspend both doses or just one at first? Pt also stated that she is scheduled for a lab appt on 07/22/2017 and wanted to make sure it was ok for her to wait until then to come in. Please advise.

## 2017-07-13 NOTE — Telephone Encounter (Signed)
Pt called about the medication that she's on is making her very fatique. Medication is carvedilol (COREG) 6.25 MG tablet, torsemide (DEMADEX) 5 MG tablet, metolazone (ZAROXOLYN) 2.5 MG tablet, atorvastatin (LIPITOR) 20 MG tablet, irbesartan (AVAPRO) 150 MG tablet,and Cq10. Please advise?  Call pt @ [phone]. Thank you!

## 2017-07-13 NOTE — Telephone Encounter (Signed)
She will have to suspend one at a time. Have her suspend the carvedilol and continue the other meds  For one week  Also ask her to come in for a nonfasting BMET asap

## 2017-07-13 NOTE — Telephone Encounter (Signed)
Please advise 

## 2017-07-14 NOTE — Telephone Encounter (Signed)
Spoke with pt and she stated that she does not feel like the torsemide is causing too much fluid loss. The pt also stated that she was only going to stop taking one dose of the carvedilol because before she started taking both the AM and the PM dose she felt fine. So pt is only going to suspend one of the carvedilol doses for a week. Pt was advised that if her blood pressure goes up after stopping the second dose then she would need to call the office and let us know. Pt gave a verbal understanding.

## 2017-07-22 ENCOUNTER — Other Ambulatory Visit (INDEPENDENT_AMBULATORY_CARE_PROVIDER_SITE_OTHER): Payer: Medicare Other

## 2017-07-22 DIAGNOSIS — N183 Chronic kidney disease, stage 3 unspecified: Secondary | ICD-10-CM

## 2017-07-22 DIAGNOSIS — I10 Essential (primary) hypertension: Secondary | ICD-10-CM | POA: Diagnosis not present

## 2017-07-22 NOTE — Addendum Note (Signed)
Addended by: Elpidio Galea T on: 07/22/2017 03:54 PM   Modules accepted: Orders

## 2017-07-22 NOTE — Telephone Encounter (Signed)
Patient is having elevated bps a thome please check BP

## 2017-07-22 NOTE — Addendum Note (Signed)
Addended by: Elpidio Galea T on: 07/22/2017 03:53 PM   Modules accepted: Orders

## 2017-07-22 NOTE — Addendum Note (Signed)
Addended by: Elpidio Galea T on: 07/22/2017 03:52 PM   Modules accepted: Orders

## 2017-07-22 NOTE — Telephone Encounter (Signed)
Checked patients BP after labs. 144/80.

## 2017-07-23 LAB — COMPREHENSIVE METABOLIC PANEL
AG RATIO: 0.9 (calc) — AB (ref 1.0–2.5)
ALT: 16 U/L (ref 6–29)
AST: 18 U/L (ref 10–35)
Albumin: 3 g/dL — ABNORMAL LOW (ref 3.6–5.1)
Alkaline phosphatase (APISO): 43 U/L (ref 33–130)
BILIRUBIN TOTAL: 0.2 mg/dL (ref 0.2–1.2)
BUN / CREAT RATIO: 15 (calc) (ref 6–22)
BUN: 25 mg/dL (ref 7–25)
CALCIUM: 9.2 mg/dL (ref 8.6–10.4)
CHLORIDE: 106 mmol/L (ref 98–110)
CO2: 23 mmol/L (ref 20–32)
Creat: 1.65 mg/dL — ABNORMAL HIGH (ref 0.60–0.93)
GLOBULIN: 3.4 g/dL (ref 1.9–3.7)
GLUCOSE: 155 mg/dL — AB (ref 65–99)
Potassium: 4.1 mmol/L (ref 3.5–5.3)
Sodium: 141 mmol/L (ref 135–146)
Total Protein: 6.4 g/dL (ref 6.1–8.1)

## 2017-07-24 ENCOUNTER — Telehealth: Payer: Self-pay | Admitting: Internal Medicine

## 2017-07-24 NOTE — Telephone Encounter (Signed)
Renal function is stable and potassium is normal.  Continue torsemide

## 2017-07-25 ENCOUNTER — Telehealth: Payer: Self-pay | Admitting: Internal Medicine

## 2017-07-25 DIAGNOSIS — E1165 Type 2 diabetes mellitus with hyperglycemia: Secondary | ICD-10-CM | POA: Diagnosis not present

## 2017-07-25 LAB — HEMOGLOBIN A1C: Hemoglobin A1C: 9.7

## 2017-07-25 NOTE — Telephone Encounter (Signed)
Last A1C I can find on patient is dated 8 can I order one to close the A1c gaps?

## 2017-07-25 NOTE — Telephone Encounter (Signed)
Patient on her way to Endocrinology today to bring copy of A1c to office.

## 2017-07-25 NOTE — Telephone Encounter (Signed)
Andrea Stewart manages her diabetes.

## 2017-07-26 NOTE — Telephone Encounter (Signed)
Notified pt of lab results and scheduled pt a lab appt to lipid panel checked.

## 2017-08-04 ENCOUNTER — Telehealth: Payer: Self-pay | Admitting: Internal Medicine

## 2017-08-04 ENCOUNTER — Ambulatory Visit (INDEPENDENT_AMBULATORY_CARE_PROVIDER_SITE_OTHER): Payer: Medicare Other

## 2017-08-04 VITALS — BP 130/64 | HR 73 | Temp 98.3°F | Resp 16 | Ht 59.0 in | Wt 199.1 lb

## 2017-08-04 DIAGNOSIS — Z23 Encounter for immunization: Secondary | ICD-10-CM | POA: Diagnosis not present

## 2017-08-04 DIAGNOSIS — Z0000 Encounter for general adult medical examination without abnormal findings: Secondary | ICD-10-CM | POA: Diagnosis not present

## 2017-08-04 NOTE — Patient Instructions (Addendum)
  Andrea Stewart , Thank you for taking time to come for your Medicare Wellness Visit. I appreciate your ongoing commitment to your health goals. Please review the following plan we discussed and let me know if I can assist you in the future.   Follow up with Dr. Derrel Nip as needed.    Bring a copy of your [place] and/or Living Will to be scanned into chart.  Have a great day!  These are the goals we discussed: Goals    . Quit smoking / using tobacco          Smoke no more than 5 cigarettes per day, decrease until quit smoking       This is a list of the screening recommended for you and due dates:  Health Maintenance  Topic Date Due  .  Hepatitis C: One time screening is recommended by Center for Disease Control  (CDC) for  adults born from [birth year] through 1966.   1946/02/13  . Hemoglobin A1C  07/01/2014  . Pneumonia vaccines (2 of 2 - PPSV23) 03/02/2015  . Complete foot exam   11/06/2015  . Eye exam for diabetics  03/29/2017  . Flu Shot  11/03/2017*  . Mammogram  01/06/2019  . Colon Cancer Screening  04/18/2022  . Tetanus Vaccine  11/05/2024  . DEXA scan (bone density measurement)  Completed  *Topic was postponed. The date shown is not the original due date.

## 2017-08-04 NOTE — Progress Notes (Signed)
Subjective:   Andrea Stewart is a 71 y.o. female who presents for Medicare Annual (Subsequent) preventive examination.  Review of Systems:  No ROS.  Medicare Wellness Visit. Additional risk factors are reflected in the social history.  Cardiac Risk Factors include: diabetes mellitus;advanced age (>24men, >48 women);hypertension;obesity (BMI >30kg/m2)     Objective:     Vitals: BP 130/64 (BP Location: Left Arm, Patient Position: Sitting, Cuff Size: Large)   Pulse 73   Temp 98.3 F (36.8 C) (Oral)   Resp 16   Ht 4\' 11"  (1.499 m)   Wt 199 lb 1.9 oz (90.3 kg)   SpO2 95%   BMI 40.22 kg/m   Body mass index is 40.22 kg/m.   Tobacco History  Smoking Status  . Current Every Day Smoker  . Packs/day: 0.25  . Years: 30.00  . Types: Cigarettes  Smokeless Tobacco  . Never Used    Comment: currently smoking .25 ppd, trying to quit 02/13/16     Ready to quit: Not Answered Counseling given: Not Answered   Past Medical History:  Diagnosis Date  . Backache, unspecified    s/p c-spine and l-spine fusions  . Degeneration of intervertebral disc, site unspecified   . Depressive disorder, not elsewhere classified   . Disorder of bone and cartilage, unspecified   . GERD (gastroesophageal reflux disease)   . HTN (hypertension)   . Hx of left heart catheterization by cutdown    a. 7 years ago; no significant cad; b. Lexiscan 2014: no significant ischemia, no significant EKG changes concerning for ischemia, EF 67%, overall low risk study  . Myalgia and myositis, unspecified   . Obesity, unspecified   . Personal history of tobacco use, presenting hazards to health    1/2 ppd  . Plantar fascial fibromatosis   . S/P cholecystectomy   . S/p nephrectomy   . Type II or unspecified type diabetes mellitus without mention of complication, not stated as uncontrolled   . Unspecified essential hypertension   . Unspecified hereditary and idiopathic peripheral neuropathy    secondary to  diabetes   Past Surgical History:  Procedure Laterality Date  . ABDOMINAL HYSTERECTOMY    . APPENDECTOMY    . BACK SURGERY     2006  . CARDIAC CATHETERIZATION     o.k. 2003  . CARPAL TUNNEL RELEASE    . CERVICAL DISCECTOMY    . CHOLECYSTECTOMY    . COLONOSCOPY  09/1999   colonoscopy-hemorrhoids, re-check 5 years (10/2006)  . dexa     osteopenia (01/2004)  . ELBOW SURGERY    . FOOT SURGERY     x 3  . KIDNEY DONATION  1991  . LUMBAR FUSION    . NECK SURGERY     06/2005  . Problem with general Anesthesia     02/2006  . REPLACEMENT TOTAL KNEE  10/2004  . trigger finger surgery    . VESICOVAGINAL FISTULA CLOSURE W/ TAH    . VIDEO BRONCHOSCOPY Bilateral 04/02/2015   Procedure: VIDEO BRONCHOSCOPY WITHOUT FLUORO;  Surgeon: Juanito Doom, MD;  Location: [place] [facility];  Service: Cardiopulmonary;  Laterality: Bilateral;   Family History  Problem Relation Age of Onset  . Hypertension Mother   . Colon cancer Mother   . Cancer Mother        colon  . Coronary artery disease Mother   . Diabetes Mother   . Prostate cancer Father   . COPD Father   . Cancer Father  prostate  . Heart attack Brother 51  . Sleep apnea Brother   . Diabetes Paternal Aunt   . Diabetes Paternal Uncle   . Cancer Daughter        Brain, lungs, liver, spine, kidney   History  Sexual Activity  . Sexual activity: Not on file    Outpatient Encounter Prescriptions as of 08/04/2017  Medication Sig  . albuterol (PROVENTIL HFA) 108 (90 BASE) MCG/ACT inhaler Inhale 2 puffs into the lungs every 4 (four) hours as needed for wheezing or shortness of breath.  Marland Kitchen amLODipine (NORVASC) 10 MG tablet Take one tablet (10mg )  daily  . aspirin 81 MG tablet Take 1 tablet (81 mg total) by mouth daily.  Marland Kitchen atorvastatin (LIPITOR) 20 MG tablet Take 1 tablet (20 mg total) by mouth daily.  . bisacodyl (DULCOLAX) 10 MG suppository Place 1 suppository (10 mg total) rectally as needed for moderate constipation.  .  budesonide-formoterol (SYMBICORT) 160-4.5 MCG/ACT inhaler Inhale 2 puffs into the lungs 2 (two) times daily.  . calcium carbonate (OS-CAL) 600 MG TABS Take 600 mg by mouth daily.    . carvedilol (COREG) 6.25 MG tablet Take 1 tablet (6.25 mg total) by mouth 2 (two) times daily with a meal.  . chlorpheniramine-HYDROcodone (TUSSIONEX PENNKINETIC ER) 10-8 MG/5ML SUER Take 5 mLs by mouth every 12 (twelve) hours as needed.  Marland Kitchen co-enzyme Q-10 30 MG capsule Take 30 mg by mouth daily.  Marland Kitchen DAILY MULTIPLE VITAMINS PO Take 1 tablet by mouth daily.    . fluticasone (VERAMYST) 27.5 MCG/SPRAY nasal spray Place 2 sprays into the nose as needed for rhinitis or allergies.   Marland Kitchen insulin lispro (HUMALOG) 100 UNIT/ML injection Inject into the skin as directed. Sliding scale 100-150 = 2-2.5 units 150-200 = 3-3.5 units 200-250 = 4-4.5 units 251-300 = 5-5.5 units 301-350 = 6-6.5 units 350-400 = 7-7.5 units  . insulin NPH (HUMULIN N,NOVOLIN N) 100 UNIT/ML injection Inject 20 Units into the skin daily before breakfast. 22 units at bedtime  . irbesartan (AVAPRO) 150 MG tablet   . metFORMIN (GLUCOPHAGE-XR) 500 MG 24 hr tablet Take 500 mg by mouth 2 (two) times daily.   . metolazone (ZAROXOLYN) 2.5 MG tablet Take 1 tablet (2.5 mg total) by mouth daily. 30 mintues prior to torsemide  . mometasone (NASONEX) 50 MCG/ACT nasal spray Place 2 sprays into the nose daily as needed.   . nitroGLYCERIN (NITROSTAT) 0.4 MG SL tablet Place 1 tablet (0.4 mg total) under the tongue every 5 (five) minutes as needed. Maximum 3 tablets (Patient taking differently: Place 0.4 mg under the tongue every 5 (five) minutes as needed for chest pain. Maximum 3 tablets)  . sodium chloride (OCEAN) 0.65 % SOLN nasal spray Place 2 sprays into both nostrils as needed for congestion.  Marland Kitchen Spacer/Aero-Holding Chambers (AEROCHAMBER MV) inhaler Use as instructed  . torsemide (DEMADEX) 5 MG tablet Take 1 tablet (5 mg total) by mouth daily.  . traMADol (ULTRAM) 50 MG  tablet Take 50 mg by mouth every 8 (eight) hours as needed for moderate pain or severe pain.  . [DISCONTINUED] cloNIDine (CATAPRES) 0.1 MG tablet Take 1 tablet by mouth 2 (two) times daily.   No facility-administered encounter medications on file as of 08/04/2017.     Activities of Daily Living In your present state of health, do you have any difficulty performing the following activities: 08/04/2017  Hearing? N  Vision? N  Difficulty concentrating or making decisions? N  Walking or climbing stairs? Darreld Mclean  Dressing or bathing? N  Doing errands, shopping? Y  Comment She does not drive due to the fear of elevated BS  Preparing Food and eating ? N  Using the Toilet? N  In the past six months, have you accidently leaked urine? N  Comment Followed by [facility] Specialist  Do you have problems with loss of bowel control? N  Managing your Medications? N  Managing your Finances? N  Housekeeping or managing your Housekeeping? N  Some recent data might be hidden    Patient Care Team: Crecencio Mc, MD as PCP - General (Internal Medicine) Jacelyn Pi, MD as Consulting Physician (Endocrinology) Minna Merritts, MD as Consulting Physician (Cardiology)    Assessment:    This is a routine wellness examination for Nusayba. The goal of the wellness visit is to assist the patient how to close the gaps in care and create a preventative care plan for the patient.   The roster of all physicians providing medical care to patient is listed in the Snapshot section of the chart.  Taking calcium VIT D as appropriate/Osteoporosis risk reviewed.    Safety issues reviewed; Smoke and carbon monoxide detectors in the home. No firearms in the home.  Wears seatbelts when driving or riding with others. Patient does wear sunscreen or protective clothing when in direct sunlight. No violence in the home.  Patient is alert, normal appearance, oriented to person/place/and time.  Correctly identified the  president of the [place], recall of 3/3 words, and performing simple calculations. Displays appropriate judgement and can read correct time from watch face.   No new identified risk were noted.  No failures at ADL's or IADL's.    BMI- discussed the importance of a healthy diet, water intake and the benefits of aerobic exercise. Educational material provided.   24 hour diet recall: Breakfast: 2 boiled eggs, 1 piece of toast with peanut butter Lunch: turkey/cheese sandwich pickles, chips Dinner: vegetables Snack: frozen grapes Daily fluid intake: 0 cups of caffeine,  2-3 of water  Dental- every 12 months.  Upper plate. Dr. Carman Ching.    Eye- Visual acuity not assessed per patient preference since they have regular follow up with the ophthalmologist.  Wears corrective lenses.  Sleep patterns- Sleeps 8-10  hours at night.  Wakes feeling rested.  Diabetes; followed  By Endocrinologist.  Pneumovax 23 vaccine administered R deltoid, tolerated well. Educational material provided.  BP check scheduled due to recent medication change. Verbal order per Dr.Cook.  Patient Concerns: None at this time. Follow up with PCP as needed.  Exercise Activities and Dietary recommendations Current Exercise Habits: The patient does not participate in regular exercise at present  Goals    . Quit smoking / using tobacco          Smoke no more than 5 cigarettes per day, decrease until quit smoking      Fall Risk Fall Risk  08/04/2017 09/16/2016 07/16/2016 11/05/2014  Falls in the past year? No No No No  Comment - - Emmi Telephone Survey: data to providers prior to load -   Depression Screen PHQ 2/9 Scores 08/04/2017 09/16/2016 11/05/2014 09/09/2012  PHQ - 2 Score 0 0 0 0  PHQ- 9 Score 0 - - -     Cognitive Function     6CIT Screen 08/04/2017  What Year? 0 points  What month? 0 points  What time? 0 points  Count back from 20 0 points  Months in reverse 0 points  Repeat phrase 0  points  Total Score 0     Immunization History  Administered Date(s) Administered  . Influenza Split 09/01/2011, 09/13/2016  . Influenza Whole 11/02/2005  . Influenza,inj,Quad PF,6+ Mos 07/26/2013, 09/24/2014, 08/11/2015  . Influenza-Unspecified 09/07/2012  . Pneumococcal Conjugate-13 03/01/2014  . Pneumococcal Polysaccharide-23 03/01/2006, 08/04/2017  . Td 11/15/2000  . Tdap 11/05/2014  . Zoster 03/01/2012   Screening Tests Health Maintenance  Topic Date Due  . Hepatitis C Screening  09-Jul-1946  . HEMOGLOBIN A1C  07/01/2014  . PNA vac Low Risk Adult (2 of 2 - PPSV23) 03/02/2015  . FOOT EXAM  11/06/2015  . OPHTHALMOLOGY EXAM  03/29/2017  . INFLUENZA VACCINE  11/03/2017 (Originally 06/15/2017)  . MAMMOGRAM  01/06/2019  . COLONOSCOPY  04/18/2022  . TETANUS/TDAP  11/05/2024  . DEXA SCAN  Completed      Plan:    End of life planning; Advance aging; Advanced directives discussed. Copy of current HCPOA/Living Will requested.    I have personally reviewed and noted the following in the patient's chart:   . Medical and social history . Use of alcohol, tobacco or illicit drugs  . Current medications and supplements . Functional ability and status . Nutritional status . Physical activity . Advanced directives . List of other physicians . Hospitalizations, surgeries, and ER visits in previous 12 months . Vitals . Screenings to include cognitive, depression, and falls . Referrals and appointments  In addition, I have reviewed and discussed with patient certain preventive protocols, quality metrics, and best practice recommendations. A written personalized care plan for preventive services as well as general preventive health recommendations were provided to patient.     Varney Biles, LPN  01/04/2541

## 2017-08-04 NOTE — Telephone Encounter (Signed)
Pt dropped off a copy of her office visit withDr. Chalmers Cater. Dr Derrel Nip requested a copy at last visit. Papers are up front in color folder.

## 2017-08-04 NOTE — Progress Notes (Signed)
Care was provided under my supervision. I agree with the management as indicated in the note.  Jeffey Janssen DO  

## 2017-08-08 ENCOUNTER — Telehealth: Payer: Self-pay | Admitting: Internal Medicine

## 2017-08-08 DIAGNOSIS — E118 Type 2 diabetes mellitus with unspecified complications: Principal | ICD-10-CM

## 2017-08-08 DIAGNOSIS — IMO0002 Reserved for concepts with insufficient information to code with codable children: Secondary | ICD-10-CM

## 2017-08-08 DIAGNOSIS — Z794 Long term (current) use of insulin: Principal | ICD-10-CM

## 2017-08-08 DIAGNOSIS — E1165 Type 2 diabetes mellitus with hyperglycemia: Secondary | ICD-10-CM

## 2017-08-08 NOTE — Telephone Encounter (Signed)
Records reviewed, and she is out of contro despite seeing an endicrinologist .  Would she like a second opinion from a Financial controller endocrinologist .

## 2017-08-08 NOTE — Assessment & Plan Note (Addendum)
Managed by Dr Jacelyn Pi for years  A1C 9.7 SEPT 75 . He has prescribed N insulin bid 20/24  AND regular INSULIN AT MEALTIME TID USING SLIDING SCALE

## 2017-08-08 NOTE — Telephone Encounter (Signed)
Placed in red folder  

## 2017-08-08 NOTE — Telephone Encounter (Signed)
REQUESTED [facility] WE DO NOT HAVE  A  RECENT A1C.   PER NOTES IT IS 9.7 ON SEPT 10 .  A1C HAS BEEN ABSTRACTED

## 2017-08-08 NOTE — Telephone Encounter (Signed)
Also abstracted Microalbumin.

## 2017-08-10 NOTE — Telephone Encounter (Signed)
Spoke with pt and she stated that she is not interested in a second opinion from a Financial controller endocrinologist.

## 2017-08-17 DIAGNOSIS — E119 Type 2 diabetes mellitus without complications: Secondary | ICD-10-CM | POA: Diagnosis not present

## 2017-08-17 LAB — HM DIABETES EYE EXAM

## 2017-08-24 ENCOUNTER — Telehealth: Payer: Self-pay | Admitting: Radiology

## 2017-08-24 ENCOUNTER — Other Ambulatory Visit: Payer: Self-pay | Admitting: Internal Medicine

## 2017-08-24 DIAGNOSIS — R5383 Other fatigue: Secondary | ICD-10-CM

## 2017-08-24 DIAGNOSIS — E1165 Type 2 diabetes mellitus with hyperglycemia: Secondary | ICD-10-CM

## 2017-08-24 DIAGNOSIS — N183 Chronic kidney disease, stage 3 unspecified: Secondary | ICD-10-CM

## 2017-08-24 DIAGNOSIS — E118 Type 2 diabetes mellitus with unspecified complications: Secondary | ICD-10-CM

## 2017-08-24 DIAGNOSIS — IMO0002 Reserved for concepts with insufficient information to code with codable children: Secondary | ICD-10-CM

## 2017-08-24 NOTE — Telephone Encounter (Signed)
Pt coming in tomorrow for labs, please place future orders. Thank you.  

## 2017-08-24 NOTE — Addendum Note (Signed)
Addended by: Crecencio Mc on: 08/24/2017 08:29 AM   Modules accepted: Orders

## 2017-08-25 ENCOUNTER — Ambulatory Visit (INDEPENDENT_AMBULATORY_CARE_PROVIDER_SITE_OTHER): Payer: Medicare Other

## 2017-08-25 ENCOUNTER — Other Ambulatory Visit (INDEPENDENT_AMBULATORY_CARE_PROVIDER_SITE_OTHER): Payer: Medicare Other

## 2017-08-25 DIAGNOSIS — IMO0002 Reserved for concepts with insufficient information to code with codable children: Secondary | ICD-10-CM

## 2017-08-25 DIAGNOSIS — R5383 Other fatigue: Secondary | ICD-10-CM

## 2017-08-25 DIAGNOSIS — E118 Type 2 diabetes mellitus with unspecified complications: Secondary | ICD-10-CM | POA: Diagnosis not present

## 2017-08-25 DIAGNOSIS — E1165 Type 2 diabetes mellitus with hyperglycemia: Secondary | ICD-10-CM

## 2017-08-25 DIAGNOSIS — Z23 Encounter for immunization: Secondary | ICD-10-CM | POA: Diagnosis not present

## 2017-08-25 DIAGNOSIS — I10 Essential (primary) hypertension: Secondary | ICD-10-CM

## 2017-08-25 LAB — CBC WITH DIFFERENTIAL/PLATELET
BASOS PCT: 0.6 % (ref 0.0–3.0)
Basophils Absolute: 0 10*3/uL (ref 0.0–0.1)
EOS PCT: 5.9 % — AB (ref 0.0–5.0)
Eosinophils Absolute: 0.4 10*3/uL (ref 0.0–0.7)
HEMATOCRIT: 37 % (ref 36.0–46.0)
HEMOGLOBIN: 12.2 g/dL (ref 12.0–15.0)
Lymphocytes Relative: 26.5 % (ref 12.0–46.0)
Lymphs Abs: 1.9 10*3/uL (ref 0.7–4.0)
MCHC: 33 g/dL (ref 30.0–36.0)
MCV: 92 fl (ref 78.0–100.0)
MONO ABS: 0.6 10*3/uL (ref 0.1–1.0)
Monocytes Relative: 8.8 % (ref 3.0–12.0)
Neutro Abs: 4.2 10*3/uL (ref 1.4–7.7)
Neutrophils Relative %: 58.2 % (ref 43.0–77.0)
Platelets: 222 10*3/uL (ref 150.0–400.0)
RBC: 4.02 Mil/uL (ref 3.87–5.11)
RDW: 14 % (ref 11.5–15.5)
WBC: 7.2 10*3/uL (ref 4.0–10.5)

## 2017-08-25 LAB — LIPID PANEL
CHOLESTEROL: 136 mg/dL (ref 0–200)
HDL: 60.3 mg/dL (ref >39.00)
LDL Cholesterol: 53 mg/dL (ref 0–99)
NonHDL: 75.53
TRIGLYCERIDES: 111 mg/dL (ref 0.0–149.0)
Total CHOL/HDL Ratio: 2
VLDL: 22.2 mg/dL (ref 0.0–40.0)

## 2017-08-25 LAB — VITAMIN B12: VITAMIN B 12: 406 pg/mL (ref 211–911)

## 2017-08-25 LAB — TSH: TSH: 3.04 u[IU]/mL (ref 0.35–4.50)

## 2017-08-25 NOTE — Progress Notes (Addendum)
Patient comes in for blood pressure check and flu shot . Checked  in left arm only, due to glucose monitoring device in right arm.  Blood pressure  150/78 pulse 64 O2 95% .  She states blood pressure at home has been averaging 148/75-80.   She is taking Torsemide 5 mg 1 daily, amlodipine  10 mg 1 daily , and carvedilol 6.25 mg 1 daily.  She states she is feeling better since reduced dose of carvedilol down to once daily no fatigue or swelling.

## 2017-08-26 NOTE — Progress Notes (Signed)
  I have reviewed the above information and agree with above.   Teresa Tullo, MD 

## 2017-08-28 ENCOUNTER — Encounter: Payer: Self-pay | Admitting: Internal Medicine

## 2017-08-31 ENCOUNTER — Ambulatory Visit: Payer: Medicare Other | Admitting: Pulmonary Disease

## 2017-09-07 ENCOUNTER — Ambulatory Visit (INDEPENDENT_AMBULATORY_CARE_PROVIDER_SITE_OTHER): Payer: Medicare Other | Admitting: General Surgery

## 2017-09-07 ENCOUNTER — Encounter: Payer: Self-pay | Admitting: General Surgery

## 2017-09-07 ENCOUNTER — Observation Stay: Payer: Medicare Other

## 2017-09-07 ENCOUNTER — Inpatient Hospital Stay
Admission: AD | Admit: 2017-09-07 | Discharge: 2017-09-09 | DRG: 355 | Disposition: A | Discharge status: Home / Self Care | Payer: Medicare Other | Source: Ambulatory Visit | Attending: Internal Medicine | Admitting: Internal Medicine

## 2017-09-07 ENCOUNTER — Other Ambulatory Visit: Payer: Self-pay | Admitting: General Surgery

## 2017-09-07 VITALS — BP 132/74 | HR 92 | Resp 16 | Ht 61.0 in | Wt 202.0 lb

## 2017-09-07 DIAGNOSIS — Z833 Family history of diabetes mellitus: Secondary | ICD-10-CM

## 2017-09-07 DIAGNOSIS — K219 Gastro-esophageal reflux disease without esophagitis: Secondary | ICD-10-CM | POA: Diagnosis present

## 2017-09-07 DIAGNOSIS — K432 Incisional hernia without obstruction or gangrene: Secondary | ICD-10-CM | POA: Diagnosis not present

## 2017-09-07 DIAGNOSIS — J449 Chronic obstructive pulmonary disease, unspecified: Secondary | ICD-10-CM | POA: Diagnosis present

## 2017-09-07 DIAGNOSIS — Z7951 Long term (current) use of inhaled steroids: Secondary | ICD-10-CM

## 2017-09-07 DIAGNOSIS — R768 Other specified abnormal immunological findings in serum: Secondary | ICD-10-CM | POA: Diagnosis not present

## 2017-09-07 DIAGNOSIS — E119 Type 2 diabetes mellitus without complications: Secondary | ICD-10-CM | POA: Diagnosis not present

## 2017-09-07 DIAGNOSIS — Z7982 Long term (current) use of aspirin: Secondary | ICD-10-CM | POA: Diagnosis not present

## 2017-09-07 DIAGNOSIS — E669 Obesity, unspecified: Secondary | ICD-10-CM | POA: Diagnosis present

## 2017-09-07 DIAGNOSIS — R809 Proteinuria, unspecified: Secondary | ICD-10-CM | POA: Diagnosis not present

## 2017-09-07 DIAGNOSIS — Z96659 Presence of unspecified artificial knee joint: Secondary | ICD-10-CM | POA: Diagnosis present

## 2017-09-07 DIAGNOSIS — R110 Nausea: Secondary | ICD-10-CM | POA: Diagnosis not present

## 2017-09-07 DIAGNOSIS — N183 Chronic kidney disease, stage 3 (moderate): Secondary | ICD-10-CM | POA: Diagnosis not present

## 2017-09-07 DIAGNOSIS — R109 Unspecified abdominal pain: Secondary | ICD-10-CM | POA: Diagnosis not present

## 2017-09-07 DIAGNOSIS — Z8249 Family history of ischemic heart disease and other diseases of the circulatory system: Secondary | ICD-10-CM | POA: Diagnosis not present

## 2017-09-07 DIAGNOSIS — Z79899 Other long term (current) drug therapy: Secondary | ICD-10-CM

## 2017-09-07 DIAGNOSIS — Z6837 Body mass index (BMI) 37.0-37.9, adult: Secondary | ICD-10-CM | POA: Diagnosis not present

## 2017-09-07 DIAGNOSIS — I10 Essential (primary) hypertension: Secondary | ICD-10-CM | POA: Diagnosis present

## 2017-09-07 DIAGNOSIS — E1165 Type 2 diabetes mellitus with hyperglycemia: Secondary | ICD-10-CM | POA: Diagnosis present

## 2017-09-07 DIAGNOSIS — Z885 Allergy status to narcotic agent status: Secondary | ICD-10-CM

## 2017-09-07 DIAGNOSIS — K436 Other and unspecified ventral hernia with obstruction, without gangrene: Secondary | ICD-10-CM

## 2017-09-07 DIAGNOSIS — K439 Ventral hernia without obstruction or gangrene: Secondary | ICD-10-CM

## 2017-09-07 DIAGNOSIS — Z880 Allergy status to penicillin: Secondary | ICD-10-CM | POA: Diagnosis not present

## 2017-09-07 DIAGNOSIS — E1142 Type 2 diabetes mellitus with diabetic polyneuropathy: Secondary | ICD-10-CM | POA: Diagnosis present

## 2017-09-07 DIAGNOSIS — Z825 Family history of asthma and other chronic lower respiratory diseases: Secondary | ICD-10-CM | POA: Diagnosis not present

## 2017-09-07 DIAGNOSIS — Z01818 Encounter for other preprocedural examination: Secondary | ICD-10-CM | POA: Diagnosis not present

## 2017-09-07 DIAGNOSIS — K429 Umbilical hernia without obstruction or gangrene: Secondary | ICD-10-CM | POA: Diagnosis not present

## 2017-09-07 DIAGNOSIS — Z8042 Family history of malignant neoplasm of prostate: Secondary | ICD-10-CM | POA: Diagnosis not present

## 2017-09-07 DIAGNOSIS — D631 Anemia in chronic kidney disease: Secondary | ICD-10-CM | POA: Diagnosis not present

## 2017-09-07 DIAGNOSIS — Z800 Family history of malignant neoplasm of digestive organs: Secondary | ICD-10-CM | POA: Diagnosis not present

## 2017-09-07 DIAGNOSIS — Z981 Arthrodesis status: Secondary | ICD-10-CM | POA: Diagnosis not present

## 2017-09-07 DIAGNOSIS — Z794 Long term (current) use of insulin: Secondary | ICD-10-CM

## 2017-09-07 DIAGNOSIS — E8809 Other disorders of plasma-protein metabolism, not elsewhere classified: Secondary | ICD-10-CM | POA: Diagnosis not present

## 2017-09-07 DIAGNOSIS — F17210 Nicotine dependence, cigarettes, uncomplicated: Secondary | ICD-10-CM | POA: Diagnosis present

## 2017-09-07 DIAGNOSIS — I70212 Atherosclerosis of native arteries of extremities with intermittent claudication, left leg: Secondary | ICD-10-CM | POA: Diagnosis not present

## 2017-09-07 DIAGNOSIS — E1122 Type 2 diabetes mellitus with diabetic chronic kidney disease: Secondary | ICD-10-CM | POA: Diagnosis not present

## 2017-09-07 DIAGNOSIS — Z905 Acquired absence of kidney: Secondary | ICD-10-CM | POA: Diagnosis not present

## 2017-09-07 DIAGNOSIS — Z524 Kidney donor: Secondary | ICD-10-CM | POA: Diagnosis not present

## 2017-09-07 DIAGNOSIS — R319 Hematuria, unspecified: Secondary | ICD-10-CM | POA: Diagnosis not present

## 2017-09-07 DIAGNOSIS — I2510 Atherosclerotic heart disease of native coronary artery without angina pectoris: Secondary | ICD-10-CM | POA: Diagnosis not present

## 2017-09-07 LAB — CBC WITH DIFFERENTIAL/PLATELET
BASOS ABS: 0.1 10*3/uL (ref 0–0.1)
BASOS PCT: 1 %
Eosinophils Absolute: 0.5 10*3/uL (ref 0–0.7)
Eosinophils Relative: 6 %
HEMATOCRIT: 37.8 % (ref 35.0–47.0)
Hemoglobin: 12.8 g/dL (ref 12.0–16.0)
LYMPHS PCT: 24 %
Lymphs Abs: 1.9 10*3/uL (ref 1.0–3.6)
MCH: 30.9 pg (ref 26.0–34.0)
MCHC: 33.8 g/dL (ref 32.0–36.0)
MCV: 91.4 fL (ref 80.0–100.0)
Monocytes Absolute: 0.7 10*3/uL (ref 0.2–0.9)
Monocytes Relative: 9 %
NEUTROS ABS: 5 10*3/uL (ref 1.4–6.5)
Neutrophils Relative %: 60 %
Platelets: 238 10*3/uL (ref 150–440)
RBC: 4.14 MIL/uL (ref 3.80–5.20)
RDW: 14.3 % (ref 11.5–14.5)
WBC: 8.2 10*3/uL (ref 3.6–11.0)

## 2017-09-07 LAB — BASIC METABOLIC PANEL
ANION GAP: 9 (ref 5–15)
BUN: 36 mg/dL — ABNORMAL HIGH (ref 6–20)
CO2: 26 mmol/L (ref 22–32)
Calcium: 9.2 mg/dL (ref 8.9–10.3)
Chloride: 103 mmol/L (ref 101–111)
Creatinine, Ser: 2.37 mg/dL — ABNORMAL HIGH (ref 0.44–1.00)
GFR, EST AFRICAN AMERICAN: 22 mL/min — AB (ref >60)
GFR, EST NON AFRICAN AMERICAN: 19 mL/min — AB (ref >60)
GLUCOSE: 229 mg/dL — AB (ref 65–99)
POTASSIUM: 4.1 mmol/L (ref 3.5–5.1)
Sodium: 138 mmol/L (ref 135–145)

## 2017-09-07 LAB — SURGICAL PCR SCREEN
MRSA, PCR: NEGATIVE
Staphylococcus aureus: NEGATIVE

## 2017-09-07 LAB — GLUCOSE, CAPILLARY: Glucose-Capillary: 160 mg/dL — ABNORMAL HIGH (ref 65–99)

## 2017-09-07 MED ORDER — ALBUTEROL SULFATE (2.5 MG/3ML) 0.083% IN NEBU
2.5000 mg | INHALATION_SOLUTION | RESPIRATORY_TRACT | Status: DC | PRN
Start: 2017-09-07 — End: 2017-09-09

## 2017-09-07 MED ORDER — INSULIN ASPART 100 UNIT/ML ~~LOC~~ SOLN
0.0000 [IU] | Freq: Three times a day (TID) | SUBCUTANEOUS | Status: DC
  Administered 2017-09-08: 11 [IU] via SUBCUTANEOUS
  Administered 2017-09-09: 20 [IU] via SUBCUTANEOUS
  Filled 2017-09-07 (×2): qty 1

## 2017-09-07 MED ORDER — CARVEDILOL 6.25 MG PO TABS: 6.2500 mg | ORAL_TABLET | Freq: Two times a day (BID) | ORAL | Status: DC

## 2017-09-07 MED ORDER — INSULIN ASPART 100 UNIT/ML ~~LOC~~ SOLN
0.0000 [IU] | Freq: Every day | SUBCUTANEOUS | Status: DC
  Administered 2017-09-08: 4 [IU] via SUBCUTANEOUS
  Filled 2017-09-07: qty 1

## 2017-09-07 MED ORDER — ALBUTEROL SULFATE HFA 108 (90 BASE) MCG/ACT IN AERS: 2.0000 | INHALATION_SPRAY | RESPIRATORY_TRACT | Status: DC | PRN

## 2017-09-07 MED ORDER — CEFAZOLIN SODIUM-DEXTROSE 2-4 GM/100ML-% IV SOLN
2.0000 g | INTRAVENOUS | Status: AC
  Administered 2017-09-08: 2 g via INTRAVENOUS
  Filled 2017-09-07 (×2): qty 100

## 2017-09-07 MED ORDER — SODIUM CHLORIDE 0.9 % IV SOLN
4.0000 mg | INTRAVENOUS | Status: DC | PRN
Start: 2017-09-07 — End: 2021-01-09

## 2017-09-07 MED ORDER — MOMETASONE FURO-FORMOTEROL FUM 200-5 MCG/ACT IN AERO
2.0000 | INHALATION_SPRAY | Freq: Two times a day (BID) | RESPIRATORY_TRACT | Status: DC
  Administered 2017-09-07 – 2017-09-09 (×4): 2 via RESPIRATORY_TRACT
  Filled 2017-09-07: qty 8.8

## 2017-09-07 MED ORDER — LACTATED RINGERS IV SOLN
INTRAVENOUS | Status: DC
  Administered 2017-09-07 – 2017-09-09 (×2): via INTRAVENOUS

## 2017-09-07 MED ORDER — IPRATROPIUM-ALBUTEROL 0.5-2.5 (3) MG/3ML IN SOLN
3.0000 mL | Freq: Four times a day (QID) | RESPIRATORY_TRACT | Status: DC
  Administered 2017-09-07 – 2017-09-08 (×3): 3 mL via RESPIRATORY_TRACT
  Filled 2017-09-07 (×4): qty 3

## 2017-09-07 MED ORDER — TRAMADOL HCL 50 MG PO TABS: 50.0000 mg | ORAL_TABLET | ORAL | Status: DC | PRN

## 2017-09-07 MED ORDER — ONDANSETRON HCL 4 MG/2ML IJ SOLN: 4.0000 mg | INTRAMUSCULAR | Status: DC | PRN

## 2017-09-07 MED ORDER — LACTATED RINGERS IV SOLN: INTRAVENOUS | Status: DC

## 2017-09-07 MED ORDER — NITROGLYCERIN 0.4 MG SL SUBL: 0.4000 mg | SUBLINGUAL_TABLET | SUBLINGUAL | Status: DC | PRN

## 2017-09-07 MED ORDER — CARVEDILOL 6.25 MG PO TABS
6.2500 mg | ORAL_TABLET | Freq: Two times a day (BID) | ORAL | Status: DC
  Administered 2017-09-08 – 2017-09-09 (×3): 6.25 mg via ORAL
  Filled 2017-09-07 (×3): qty 1

## 2017-09-07 MED ORDER — TRAMADOL HCL 50 MG PO TABS
50.0000 mg | ORAL_TABLET | ORAL | Status: DC | PRN
  Administered 2017-09-07 – 2017-09-09 (×5): 50 mg via ORAL
  Filled 2017-09-07 (×5): qty 1

## 2017-09-07 MED ORDER — ALBUTEROL SULFATE HFA 108 (90 BASE) MCG/ACT IN AERS: 2.0000 | INHALATION_SPRAY | RESPIRATORY_TRACT | Status: AC | PRN

## 2017-09-07 NOTE — Consult Note (Signed)
[facility] NAME: Andrea Stewart    MR#:  [id number]  DATE OF BIRTH:  [birth year]  DATE OF ADMISSION:  09/07/2017  PRIMARY CARE PHYSICIAN: Crecencio Mc, MD   REQUESTING/REFERRING PHYSICIAN: Robert Bellow, MD  CHIEF COMPLAINT:  No chief complaint on file. Abd pain  HISTORY OF PRESENT ILLNESS:  Andrea Stewart  is a 71 y.o. female with a known history of HTN, DM, COPD and active smoker admitted for evaluation of sudden abdominal pain and nausea. Likely for ventral hernia repair planned for tomorrow. We are consulted for medical mgmt. She reports taking all her medications regularly as prescribed but actively smokes. She reports difficulty with blood sugar control at home. Last HbA1c 9.7 (07/25/17). All family at bedside. Not in much pain and waiting for CT when I saw her. PAST MEDICAL HISTORY:   Past Medical History:  Diagnosis Date  . Backache, unspecified    s/p c-spine and l-spine fusions  . Degeneration of intervertebral disc, site unspecified   . Depressive disorder, not elsewhere classified   . Disorder of bone and cartilage, unspecified   . HTN (hypertension)   . Hx of left heart catheterization by cutdown    a. 7 years ago; no significant cad; b. Lexiscan 2014: no significant ischemia, no significant EKG changes concerning for ischemia, EF 67%, overall low risk study  . Kidney failure    Dr Cyndia Diver  . Myalgia and myositis, unspecified   . Obesity, unspecified   . Personal history of tobacco use, presenting hazards to health    1/2 ppd  . Plantar fascial fibromatosis   . S/P cholecystectomy   . S/p nephrectomy   . Type II or unspecified type diabetes mellitus without mention of complication, not stated as uncontrolled   . Unspecified essential hypertension   . Unspecified hereditary and idiopathic peripheral neuropathy    secondary to diabetes    PAST SURGICAL HISTOIRY:   Past Surgical History:    Procedure Laterality Date  . ABDOMINAL HYSTERECTOMY    . APPENDECTOMY    . BACK SURGERY     2006  . CARDIAC CATHETERIZATION     o.k. 2003  . CARPAL TUNNEL RELEASE    . CERVICAL DISCECTOMY    . CHOLECYSTECTOMY    . COLONOSCOPY  09/1999   colonoscopy-hemorrhoids, re-check 5 years (10/2006)  . dexa     osteopenia (01/2004)  . ELBOW SURGERY    . FOOT SURGERY     x 3  . KIDNEY DONATION  1991  . LUMBAR FUSION    . NECK SURGERY     06/2005  . Problem with general Anesthesia     02/2006  . REPLACEMENT TOTAL KNEE  10/2004  . trigger finger surgery    . VESICOVAGINAL FISTULA CLOSURE W/ TAH    . VIDEO BRONCHOSCOPY Bilateral 04/02/2015   Procedure: VIDEO BRONCHOSCOPY WITHOUT FLUORO;  Surgeon: Juanito Doom, MD;  Location: [facility];  Service: Cardiopulmonary;  Laterality: Bilateral;    SOCIAL HISTORY:   Social History  Substance Use Topics  . Smoking status: Current Every Day Smoker    Packs/day: 0.25    Years: 30.00    Types: Cigarettes  . Smokeless tobacco: Never Used     Comment: currently smoking .25 ppd, trying to quit 02/13/16  . Alcohol use No    FAMILY HISTORY:   Family History  Problem Relation Age of Onset  . Hypertension Mother   .  Colon cancer Mother   . Cancer Mother        colon  . Coronary artery disease Mother   . Diabetes Mother   . Prostate cancer Father   . COPD Father   . Cancer Father        prostate  . Heart attack Brother 78  . Sleep apnea Brother   . Diabetes Paternal Aunt   . Diabetes Paternal Uncle   . Cancer Daughter        Brain, lungs, liver, spine, kidney    DRUG ALLERGIES:   Allergies  Allergen Reactions  . Morphine Anaphylaxis  . Prednisone Other (See Comments)    "makes me think I am having a heart attack"  . Gabapentin Other (See Comments)    intolerance  . Nsaids Diarrhea  . Penicillins Swelling    Has patient had a PCN reaction causing immediate rash, facial/tongue/throat swelling, SOB or lightheadedness with  hypotension: Yes Has patient had a PCN reaction causing severe rash involving mucus membranes or skin necrosis: No Has patient had a PCN reaction that required hospitalization: No Has patient had a PCN reaction occurring within the last 10 years: No If all of the above answers are "NO", then may proceed with Cephalosporin use.     REVIEW OF SYSTEMS:  CONSTITUTIONAL: No fever, fatigue or weakness.  EYES: No blurred or double vision.  EARS, NOSE, AND THROAT: No tinnitus or ear pain.  RESPIRATORY: No cough, shortness of breath, wheezing or hemoptysis.  CARDIOVASCULAR: No chest pain, orthopnea, edema.  GASTROINTESTINAL: + for nausea and abdominal pain. No vomiting, diarrhea  GENITOURINARY: No dysuria, hematuria.  ENDOCRINE: No polyuria, nocturia,  HEMATOLOGY: No anemia, easy bruising or bleeding SKIN: No rash or lesion. MUSCULOSKELETAL: No joint pain or arthritis.   NEUROLOGIC: No tingling, numbness, weakness.  PSYCHIATRY: No anxiety or depression.   MEDICATIONS AT HOME:   Prior to Admission medications   Medication Sig Start Date End Date Taking? Authorizing Provider  amLODipine (NORVASC) 10 MG tablet TAKE 1 TABLET BY MOUTH EVERY DAY. 08/24/17  Yes Crecencio Mc, MD  aspirin 81 MG tablet Take 1 tablet (81 mg total) by mouth daily. 06/19/13  Yes Minna Merritts, MD  atorvastatin (LIPITOR) 20 MG tablet Take 1 tablet (20 mg total) by mouth daily. 07/01/17  Yes Crecencio Mc, MD  budesonide-formoterol (SYMBICORT) 160-4.5 MCG/ACT inhaler Inhale 2 puffs into the lungs 2 (two) times daily. 10/27/16  Yes Juanito Doom, MD  calcium carbonate (OS-CAL) 600 MG TABS Take 600 mg by mouth daily.     Yes [provider]  carvedilol (COREG) 6.25 MG tablet Take 1 tablet (6.25 mg total) by mouth 2 (two) times daily with a meal. Patient taking differently: Take 6.25 mg by mouth daily.  07/01/17  Yes Crecencio Mc, MD  co-enzyme Q-10 30 MG capsule Take 30 mg by mouth daily.   Yes  [provider]  DAILY MULTIPLE VITAMINS PO Take 1 tablet by mouth daily.     Yes [provider]  insulin lispro (HUMALOG) 100 UNIT/ML injection Inject into the skin as directed. Sliding scale 100-150 = 2-2.5 units 150-200 = 3-3.5 units 200-250 = 4-4.5 units 251-300 = 5-5.5 units 301-350 = 6-6.5 units 350-400 = 7-7.5 units   Yes [provider]  insulin NPH (HUMULIN N,NOVOLIN N) 100 UNIT/ML injection Inject 22 Units into the skin daily before breakfast. 24 units at bedtime   Yes [provider]  irbesartan (AVAPRO) 150 MG  tablet Take 150 mg by mouth 2 (two) times daily.  06/22/17  Yes [provider]  metFORMIN (GLUCOPHAGE-XR) 500 MG 24 hr tablet Take 500 mg by mouth 2 (two) times daily.  10/26/13  Yes [provider]  metolazone (ZAROXOLYN) 2.5 MG tablet Take 1 tablet (2.5 mg total) by mouth daily. 30 mintues prior to torsemide 07/01/17  Yes Crecencio Mc, MD  torsemide (DEMADEX) 5 MG tablet Take 1 tablet (5 mg total) by mouth daily. 07/01/17  Yes Crecencio Mc, MD  albuterol (PROVENTIL HFA) 108 (90 BASE) MCG/ACT inhaler Inhale 2 puffs into the lungs every 4 (four) hours as needed for wheezing or shortness of breath. 11/12/14   Rubbie Battiest, RN  bisacodyl (DULCOLAX) 10 MG suppository Place 1 suppository (10 mg total) rectally as needed for moderate constipation. 07/19/15   Earleen Newport, MD  fluticasone (VERAMYST) 27.5 MCG/SPRAY nasal spray Place 2 sprays into the nose as needed for rhinitis or allergies.     [provider]  mometasone (NASONEX) 50 MCG/ACT nasal spray Place 2 sprays into the nose daily as needed.     [provider]  nitroGLYCERIN (NITROSTAT) 0.4 MG SL tablet Place 1 tablet (0.4 mg total) under the tongue every 5 (five) minutes as needed. Maximum 3 tablets Patient taking differently: Place 0.4 mg under the tongue every 5 (five) minutes as needed for chest pain. Maximum 3 tablets 06/19/13   Minna Merritts, MD  sodium chloride (OCEAN) 0.65 % SOLN nasal spray Place 2 sprays into both nostrils as needed for congestion.    [provider]  Spacer/Aero-Holding Chambers (AEROCHAMBER MV) inhaler Use as instructed 01/20/15   Juanito Doom, MD      VITAL SIGNS:  Blood pressure (!) 152/77, pulse 75, temperature 98.6 F (37 C), temperature source Oral, resp. rate 17, height 5\' 1"  (1.549 m), weight 90.9 kg (200 lb 8 oz), SpO2 97 %.  PHYSICAL EXAMINATION:  GENERAL:  71 y.o.-year-old patient lying in the bed with no acute distress.  EYES: Pupils equal, round, reactive to light and accommodation. No scleral icterus. Extraocular muscles intact.  HEENT: Head atraumatic, normocephalic. Oropharynx and nasopharynx clear.  NECK:  Supple, no jugular venous distention. No thyroid enlargement, no tenderness.  LUNGS: Normal breath sounds bilaterally, no wheezing, rales,rhonchi or crepitation. No use of accessory muscles of respiration.  CARDIOVASCULAR: S1, S2 normal. No murmurs, rubs, or gallops.  ABDOMEN: Has hernia below her umbilicus (mild tenderness at site), otherwise soft abdomen, BS + EXTREMITIES: No pedal edema, cyanosis, or clubbing.  NEUROLOGIC: Cranial nerves II through XII are intact. Muscle strength 5/5 in all extremities. Sensation intact. Gait not checked.  PSYCHIATRIC: The patient is alert and oriented x 3.  SKIN: No obvious rash, lesion, or ulcer.  LABORATORY PANEL:   CBC No results for input(s): WBC, HGB, HCT, PLT in the last 168 hours. ------------------------------------------------------------------------------------------------------------------  Chemistries  No results for input(s): NA, K, CL, CO2, GLUCOSE, BUN, CREATININE, CALCIUM, MG, AST, ALT, ALKPHOS, BILITOT in the last 168 hours.  Invalid input(s): GFRCGP ------------------------------------------------------------------------------------------------------------------  Cardiac Enzymes No results for  input(s): TROPONINI in the last 168 hours. ------------------------------------------------------------------------------------------------------------------  RADIOLOGY:  Ct Abdomen Pelvis Wo Contrast  Result Date: 09/07/2017 CLINICAL DATA:  Sudden onset sharp right lower quadrant abdominal pain yesterday. Continued pain today with nausea. EXAM: CT ABDOMEN AND PELVIS WITHOUT CONTRAST TECHNIQUE: Multidetector CT imaging of the abdomen and pelvis was performed following the standard protocol without IV contrast. COMPARISON:  Chest and  abdomen radiographs dated 07/19/2015. FINDINGS: Lower chest: Minimal bibasilar linear atelectasis or scarring. Hepatobiliary: Surgically absent gallbladder. Normal appearing liver. Pancreas: Mild diffuse pancreatic atrophy. Spleen: Normal in size without focal abnormality. Adrenals/Urinary Tract: Post lumpectomy changes on the left. Normal appearing adrenal glands, right kidney, right ureter and urinary bladder. Stomach/Bowel: Surgically absent appendix. Unremarkable stomach, small bowel and colon. Vascular/Lymphatic: Atheromatous arterial calcifications without aneurysm. No enlarged lymph nodes. Reproductive: Status post hysterectomy. No adnexal masses. Other: Moderate-sized infraumbilical hernia containing fat and fluid. The herniated fat and fluid measures 6 cm in maximum diameter. The hernia defect measures 1.4 cm in diameter. Musculoskeletal: Lumbar and lower thoracic spine degenerative changes. Ray cages at the L4-5 and L5-S1 levels. There are also laminectomy defects at those levels with posterior bone fusion. IMPRESSION: 1. Moderate-sized infraumbilical hernia containing fat and fluid. The fluid could be an indication of inflammation or vascular compromise of the herniated fat. 2. Otherwise, no acute abnormality. Electronically Signed   By: Claudie Revering M.D.   On: 09/07/2017 18:54    EKG:   Orders placed or performed in visit on 04/22/17  . EKG 12-Lead     IMPRESSION AND PLAN:  59 y f with known history of HTN, DM, COPD and active smoker admitted for Ventral Hernia Repair (likely tomorrow)  * Preop medical clearance - She is medically clear for planned surgery. Mild-moderate risk considering her age, active smoking, COPD, HTN, uncontrolled DM - d/w Dr Bary Castilla - waiting for CT abd to make sure there are no bowel loops in there - likely fatty tissue in which case elective surgery tomorrow  * HTN - Resume Coreg as her BP is high - adjust as need  * Uncontrolled DM - Last HbA1c 9.7 (07/25/17), will recheck - while she is NPO, will keep her on SSI - DM nurse coordinator  * COPD - active smoker, counseled for 4 mins, denies any need for nicotine replacement therapy at this time - add Duonebs for now     All the records are reviewed and case discussed with Consulting provider. Management plans discussed with the patient, family at bedside, Dr Bary Castilla over phone and they are in agreement.  CODE STATUS: FULL CODE  TOTAL TIME TAKING CARE OF THIS PATIENT: 35 minutes.    Max Sane M.D on 09/07/2017 at 6:58 PM  Between 7am to 6pm - Pager - [phone]  After 6pm go to [email or web address] - Proofreader  [facility]  Office  [phone]  CC: Primary care Physician: Crecencio Mc, MD   Note: This dictation was prepared with Dragon dictation along with smaller phrase technology. Any transcriptional errors that result from this process are unintentional.

## 2017-09-07 NOTE — Progress Notes (Addendum)
CT reviewed: Incarcerated omentum. No indication for OR tonight with risk of aspiration. Will allow clear liquids tonight, NPO after 6 AM except meds. OR late tomorrow AM.   Marked change in creatinine. Will increase IV fluids overnight and recheck in AM prior to OR.   Clarified that patient can take oxycodone for pain.

## 2017-09-07 NOTE — Progress Notes (Signed)
Patient ID: Andrea Stewart, female   DOB: 1946/07/12, 71 y.o.   MRN: [id number]  Chief Complaint  Patient presents with  . Abdominal Pain    HPI Andrea Stewart is a 71 y.o. female.  Here for evaluation of sudden abdominal pain and nausea. She states it feels like a "knife" right lower abdomen. She states it is constant and started yesterday. Nausea but no vomiting. She denies any injury or trauma. Bowels move regular, daily. She did go to [facility] and they feel it has something to do with her hernia mesh. Blood sugars running 200's today. She sees Dr Juleen China for nephrology. She is here with her husband, Carloyn Manner and great granddaughter.   HPI  Past Medical History:  Diagnosis Date  . Backache, unspecified    s/p c-spine and l-spine fusions  . Degeneration of intervertebral disc, site unspecified   . Depressive disorder, not elsewhere classified   . Disorder of bone and cartilage, unspecified   . GERD (gastroesophageal reflux disease)   . HTN (hypertension)   . Hx of left heart catheterization by cutdown    a. 7 years ago; no significant cad; b. Lexiscan 2014: no significant ischemia, no significant EKG changes concerning for ischemia, EF 67%, overall low risk study  . Kidney failure    Dr Cyndia Diver  . Myalgia and myositis, unspecified   . Obesity, unspecified   . Personal history of tobacco use, presenting hazards to health    1/2 ppd  . Plantar fascial fibromatosis   . S/P cholecystectomy   . S/p nephrectomy   . Type II or unspecified type diabetes mellitus without mention of complication, not stated as uncontrolled   . Unspecified essential hypertension   . Unspecified hereditary and idiopathic peripheral neuropathy    secondary to diabetes    Past Surgical History:  Procedure Laterality Date  . ABDOMINAL HYSTERECTOMY    . APPENDECTOMY    . BACK SURGERY     2006  . CARDIAC CATHETERIZATION     o.k. 2003  . CARPAL TUNNEL RELEASE    . CERVICAL DISCECTOMY    .  CHOLECYSTECTOMY    . COLONOSCOPY  09/1999   colonoscopy-hemorrhoids, re-check 5 years (10/2006)  . dexa     osteopenia (01/2004)  . ELBOW SURGERY    . FOOT SURGERY     x 3  . KIDNEY DONATION  1991  . LUMBAR FUSION    . NECK SURGERY     06/2005  . Problem with general Anesthesia     02/2006  . REPLACEMENT TOTAL KNEE  10/2004  . trigger finger surgery    . VESICOVAGINAL FISTULA CLOSURE W/ TAH    . VIDEO BRONCHOSCOPY Bilateral 04/02/2015   Procedure: VIDEO BRONCHOSCOPY WITHOUT FLUORO;  Surgeon: Juanito Doom, MD;  Location: [facility];  Service: Cardiopulmonary;  Laterality: Bilateral;    Family History  Problem Relation Age of Onset  . Hypertension Mother   . Colon cancer Mother   . Cancer Mother        colon  . Coronary artery disease Mother   . Diabetes Mother   . Prostate cancer Father   . COPD Father   . Cancer Father        prostate  . Heart attack Brother 36  . Sleep apnea Brother   . Diabetes Paternal Aunt   . Diabetes Paternal Uncle   . Cancer Daughter        Brain, lungs, liver, spine, kidney  Social History Social History  Substance Use Topics  . Smoking status: Current Every Day Smoker    Packs/day: 0.25    Years: 30.00    Types: Cigarettes  . Smokeless tobacco: Never Used     Comment: currently smoking .25 ppd, trying to quit 02/13/16  . Alcohol use No    Allergies  Allergen Reactions  . Morphine Anaphylaxis  . Prednisone Other (See Comments)    "makes me think I am having a heart attack"  . Gabapentin Other (See Comments)    intolerance  . Nsaids Diarrhea  . Penicillins Other (See Comments)    REACTION: rash, swelling. Remote reaction as a child to [place]. Patient did tolerate recent course of augmentin     Current Outpatient Prescriptions  Medication Sig Dispense Refill  . albuterol (PROVENTIL HFA) 108 (90 BASE) MCG/ACT inhaler Inhale 2 puffs into the lungs every 4 (four) hours as needed for wheezing or shortness of breath. 1  Inhaler 2  . amLODipine (NORVASC) 10 MG tablet TAKE 1 TABLET BY MOUTH EVERY DAY. 90 tablet 1  . aspirin 81 MG tablet Take 1 tablet (81 mg total) by mouth daily.    Marland Kitchen atorvastatin (LIPITOR) 20 MG tablet Take 1 tablet (20 mg total) by mouth daily. 90 tablet 0  . bisacodyl (DULCOLAX) 10 MG suppository Place 1 suppository (10 mg total) rectally as needed for moderate constipation. 12 suppository 0  . budesonide-formoterol (SYMBICORT) 160-4.5 MCG/ACT inhaler Inhale 2 puffs into the lungs 2 (two) times daily. 1 Inhaler 5  . calcium carbonate (OS-CAL) 600 MG TABS Take 600 mg by mouth daily.      . carvedilol (COREG) 6.25 MG tablet Take 1 tablet (6.25 mg total) by mouth 2 (two) times daily with a meal. 60 tablet 3  . co-enzyme Q-10 30 MG capsule Take 30 mg by mouth daily.    Marland Kitchen DAILY MULTIPLE VITAMINS PO Take 1 tablet by mouth daily.      . fluticasone (VERAMYST) 27.5 MCG/SPRAY nasal spray Place 2 sprays into the nose as needed for rhinitis or allergies.     Marland Kitchen insulin lispro (HUMALOG) 100 UNIT/ML injection Inject into the skin as directed. Sliding scale 100-150 = 2-2.5 units 150-200 = 3-3.5 units 200-250 = 4-4.5 units 251-300 = 5-5.5 units 301-350 = 6-6.5 units 350-400 = 7-7.5 units    . insulin NPH (HUMULIN N,NOVOLIN N) 100 UNIT/ML injection Inject 22 Units into the skin daily before breakfast. 24 units at bedtime    . irbesartan (AVAPRO) 150 MG tablet     . metFORMIN (GLUCOPHAGE-XR) 500 MG 24 hr tablet Take 500 mg by mouth 2 (two) times daily.     . metolazone (ZAROXOLYN) 2.5 MG tablet Take 1 tablet (2.5 mg total) by mouth daily. 30 mintues prior to torsemide 30 tablet 2  . mometasone (NASONEX) 50 MCG/ACT nasal spray Place 2 sprays into the nose daily as needed.     . nitroGLYCERIN (NITROSTAT) 0.4 MG SL tablet Place 1 tablet (0.4 mg total) under the tongue every 5 (five) minutes as needed. Maximum 3 tablets (Patient taking differently: Place 0.4 mg under the tongue every 5 (five) minutes as needed  for chest pain. Maximum 3 tablets) 25 tablet 6  . sodium chloride (OCEAN) 0.65 % SOLN nasal spray Place 2 sprays into both nostrils as needed for congestion.    Marland Kitchen Spacer/Aero-Holding Chambers (AEROCHAMBER MV) inhaler Use as instructed 1 each 0  . torsemide (DEMADEX) 5 MG tablet Take 1 tablet (  5 mg total) by mouth daily. 30 tablet 2   No current facility-administered medications for this visit.     Review of Systems Review of Systems  Constitutional: Negative.   Respiratory: Negative.   Gastrointestinal: Positive for abdominal pain and nausea. Negative for constipation and diarrhea.    Blood pressure 132/74, pulse 92, resp. rate 16, height 5\' 1"  (1.549 m), weight 202 lb (91.6 kg).  Physical Exam Physical Exam  Constitutional: She is oriented to person, place, and time. She appears well-developed and well-nourished.  HENT:  Mouth/Throat: Oropharynx is clear and moist.  Eyes: Conjunctivae are normal. No scleral icterus.  Neck: Neck supple.  Cardiovascular: Normal rate, regular rhythm and normal heart sounds.   Pulmonary/Chest: Effort normal and breath sounds normal.  No exacerbation of abdominal pain during deep inspiration.  Abdominal: Soft. Normal appearance and bowel sounds are normal. There is tenderness in the right lower quadrant. A hernia is present. Hernia confirmed positive in the ventral area.    5 cm mass below umbilicus that is tender.  Normal bowel sounds, no distention.  Nominal distention.  Musculoskeletal:  Right lower back pain with compression  Lymphadenopathy:    She has no cervical adenopathy.  Neurological: She is alert and oriented to person, place, and time.  Skin: Skin is warm and dry.  Psychiatric: Her behavior is normal.    Data Reviewed Laboratory studies dated July 22, 2017 showed a creatinine of 1.65, baseline dating back to May 2018.  Normal electrolytes.  July 25, 2017: hemoglobin A1c: 9.7.  January 03, 2017 urinalysis showed  microalbuminuria: 742.5  CBC dated August 25, 2017 showed a white blood cell count of 7200 with 58% polys, 26% lymphocytes and 5.9% eosinophils.  Hemoglobin 12.2.  Platelet count 222,000.  Assessment    Clinical exam consistent with incarcerated ventral hernia, likely omentum.    Plan    The patient ate a [employer] chicken pie prior to presentation to the office. (In spite of nausea, she was hungry and wanted something "quick".)  This will require delay of surgery for 8 hours unless she clinically deteroiates. I would prefer not to operate at midnight.  We will obtain a noncontrast CT scan to confirm clinical impression of incarcerated omentum.  If this is the case, the patient will be admitted, will work on glucose control, analgesics and proceed to surgical intervention tomorrow while the son is above the horizon.      HPI, Physical Exam, Assessment and Plan have been scribed under the direction and in the presence of Robert Bellow, MD. Karie Fetch, RN  I have completed the exam and reviewed the above documentation for accuracy and completeness.  I agree with the above.  Haematologist has been used and any errors in dictation or transcription are unintentional.  Hervey Ard, M.D., F.A.C.S.  Robert Bellow 09/07/2017, 4:51 PM

## 2017-09-07 NOTE — Patient Instructions (Signed)

## 2017-09-07 NOTE — H&P (Signed)
HPI Andrea Stewart is a 71 y.o. female.  Here for evaluation of sudden abdominal pain and nausea. She states it feels like a "knife" right lower abdomen. She states it is constant and started yesterday. Nausea but no vomiting. She denies any injury or trauma. Bowels move regular, daily. She did go to [facility] and they feel it has something to do with her hernia mesh. Blood sugars running 200's today. She sees Dr Juleen China for nephrology. She is here with her husband, Carloyn Manner and great granddaughter.   HPI      Past Medical History:  Diagnosis Date  . Backache, unspecified    s/p c-spine and l-spine fusions  . Degeneration of intervertebral disc, site unspecified   . Depressive disorder, not elsewhere classified   . Disorder of bone and cartilage, unspecified   . GERD (gastroesophageal reflux disease)   . HTN (hypertension)   . Hx of left heart catheterization by cutdown    a. 7 years ago; no significant cad; b. Lexiscan 2014: no significant ischemia, no significant EKG changes concerning for ischemia, EF 67%, overall low risk study  . Kidney failure    Dr Cyndia Diver  . Myalgia and myositis, unspecified   . Obesity, unspecified   . Personal history of tobacco use, presenting hazards to health    1/2 ppd  . Plantar fascial fibromatosis   . S/P cholecystectomy   . S/p nephrectomy   . Type II or unspecified type diabetes mellitus without mention of complication, not stated as uncontrolled   . Unspecified essential hypertension   . Unspecified hereditary and idiopathic peripheral neuropathy    secondary to diabetes         Past Surgical History:  Procedure Laterality Date  . ABDOMINAL HYSTERECTOMY    . APPENDECTOMY    . BACK SURGERY     2006  . CARDIAC CATHETERIZATION     o.k. 2003  . CARPAL TUNNEL RELEASE    . CERVICAL DISCECTOMY    . CHOLECYSTECTOMY    . COLONOSCOPY  09/1999   colonoscopy-hemorrhoids, re-check 5 years (10/2006)  .  dexa     osteopenia (01/2004)  . ELBOW SURGERY    . FOOT SURGERY     x 3  . KIDNEY DONATION  1991  . LUMBAR FUSION    . NECK SURGERY     06/2005  . Problem with general Anesthesia     02/2006  . REPLACEMENT TOTAL KNEE  10/2004  . trigger finger surgery    . VESICOVAGINAL FISTULA CLOSURE W/ TAH    . VIDEO BRONCHOSCOPY Bilateral 04/02/2015   Procedure: VIDEO BRONCHOSCOPY WITHOUT FLUORO;  Surgeon: Juanito Doom, MD;  Location: [place];  Service: Cardiopulmonary;  Laterality: Bilateral;    Family History  Problem Relation Age of Onset  . Hypertension Mother   . Colon cancer Mother   . Cancer Mother        colon  . Coronary artery disease Mother   . Diabetes Mother   . Prostate cancer Father   . COPD Father   . Cancer Father        prostate  . Heart attack Brother 11  . Sleep apnea Brother   . Diabetes Paternal Aunt   . Diabetes Paternal Uncle   . Cancer Daughter        Brain, lungs, liver, spine, kidney    Social History       Social History  Substance Use Topics  . Smoking status: Current Every Day  Smoker    Packs/day: 0.25    Years: 30.00    Types: Cigarettes  . Smokeless tobacco: Never Used     Comment: currently smoking .25 ppd, trying to quit 02/13/16  . Alcohol use No         Allergies  Allergen Reactions  . Morphine Anaphylaxis  . Prednisone Other (See Comments)    "makes me think I am having a heart attack"  . Gabapentin Other (See Comments)    intolerance  . Nsaids Diarrhea  . Penicillins Other (See Comments)    REACTION: rash, swelling. Remote reaction as a child to Pen V K. Patient did tolerate recent course of augmentin           Current Outpatient Prescriptions  Medication Sig Dispense Refill  . albuterol (PROVENTIL HFA) 108 (90 BASE) MCG/ACT inhaler Inhale 2 puffs into the lungs every 4 (four) hours as needed for wheezing or shortness of breath. 1 Inhaler 2  . amLODipine  (NORVASC) 10 MG tablet TAKE 1 TABLET BY MOUTH EVERY DAY. 90 tablet 1  . aspirin 81 MG tablet Take 1 tablet (81 mg total) by mouth daily.    Marland Kitchen atorvastatin (LIPITOR) 20 MG tablet Take 1 tablet (20 mg total) by mouth daily. 90 tablet 0  . bisacodyl (DULCOLAX) 10 MG suppository Place 1 suppository (10 mg total) rectally as needed for moderate constipation. 12 suppository 0  . budesonide-formoterol (SYMBICORT) 160-4.5 MCG/ACT inhaler Inhale 2 puffs into the lungs 2 (two) times daily. 1 Inhaler 5  . calcium carbonate (OS-CAL) 600 MG TABS Take 600 mg by mouth daily.      . carvedilol (COREG) 6.25 MG tablet Take 1 tablet (6.25 mg total) by mouth 2 (two) times daily with a meal. 60 tablet 3  . co-enzyme Q-10 30 MG capsule Take 30 mg by mouth daily.    Marland Kitchen DAILY MULTIPLE VITAMINS PO Take 1 tablet by mouth daily.      . fluticasone (VERAMYST) 27.5 MCG/SPRAY nasal spray Place 2 sprays into the nose as needed for rhinitis or allergies.     Marland Kitchen insulin lispro (HUMALOG) 100 UNIT/ML injection Inject into the skin as directed. Sliding scale 100-150 = 2-2.5 units 150-200 = 3-3.5 units 200-250 = 4-4.5 units 251-300 = 5-5.5 units 301-350 = 6-6.5 units 350-400 = 7-7.5 units    . insulin NPH (HUMULIN N,NOVOLIN N) 100 UNIT/ML injection Inject 22 Units into the skin daily before breakfast. 24 units at bedtime    . irbesartan (AVAPRO) 150 MG tablet     . metFORMIN (GLUCOPHAGE-XR) 500 MG 24 hr tablet Take 500 mg by mouth 2 (two) times daily.     . metolazone (ZAROXOLYN) 2.5 MG tablet Take 1 tablet (2.5 mg total) by mouth daily. 30 mintues prior to torsemide 30 tablet 2  . mometasone (NASONEX) 50 MCG/ACT nasal spray Place 2 sprays into the nose daily as needed.     . nitroGLYCERIN (NITROSTAT) 0.4 MG SL tablet Place 1 tablet (0.4 mg total) under the tongue every 5 (five) minutes as needed. Maximum 3 tablets (Patient taking differently: Place 0.4 mg under the tongue every 5 (five) minutes as needed for  chest pain. Maximum 3 tablets) 25 tablet 6  . sodium chloride (OCEAN) 0.65 % SOLN nasal spray Place 2 sprays into both nostrils as needed for congestion.    Marland Kitchen Spacer/Aero-Holding Chambers (AEROCHAMBER MV) inhaler Use as instructed 1 each 0  . torsemide (DEMADEX) 5 MG tablet Take 1 tablet (5 mg total) by  mouth daily. 30 tablet 2   No current facility-administered medications for this visit.     Review of Systems Review of Systems  Constitutional: Negative.   Respiratory: Negative.   Gastrointestinal: Positive for abdominal pain and nausea. Negative for constipation and diarrhea.    Blood pressure 132/74, pulse 92, resp. rate 16, height 5\' 1"  (1.549 m), weight 202 lb (91.6 kg).  Physical Exam Physical Exam  Constitutional: She is oriented to person, place, and time. She appears well-developed and well-nourished.  HENT:  Mouth/Throat: Oropharynx is clear and moist.  Eyes: Conjunctivae are normal. No scleral icterus.  Neck: Neck supple.  Cardiovascular: Normal rate, regular rhythm and normal heart sounds.   Pulmonary/Chest: Effort normal and breath sounds normal.  No exacerbation of abdominal pain during deep inspiration.  Abdominal: Soft. Normal appearance and bowel sounds are normal. There is tenderness in the right lower quadrant. A hernia is present. Hernia confirmed positive in the ventral area.    5 cm mass below umbilicus that is tender.  Normal bowel sounds, no distention.  Nominal distention.  Musculoskeletal:  Right lower back pain with compression  Lymphadenopathy:    She has no cervical adenopathy.  Neurological: She is alert and oriented to person, place, and time.  Skin: Skin is warm and dry.  Psychiatric: Her behavior is normal.    Data Reviewed Laboratory studies dated July 22, 2017 showed a creatinine of 1.65, baseline dating back to May 2018.  Normal electrolytes.  July 25, 2017: hemoglobin A1c: 9.7.  January 03, 2017 urinalysis  showed microalbuminuria: 742.5  CBC dated August 25, 2017 showed a white blood cell count of 7200 with 58% polys, 26% lymphocytes and 5.9% eosinophils.  Hemoglobin 12.2.  Platelet count 222,000.  Assessment    Clinical exam consistent with incarcerated ventral hernia, likely omentum.  Plan    The patient ate a [employer] chicken pie prior to presentation to the office. (In spite of nausea, she was hungry and wanted something "quick".)  This will require delay of surgery for 8 hours unless she clinically deteroiates. I would prefer not to operate at midnight.  We will obtain a noncontrast CT scan to confirm clinical impression of incarcerated omentum.  If this is the case, the patient will be admitted, will work on glucose control, analgesics and proceed to surgical intervention tomorrow while the son is above the horizon.      HPI, Physical Exam, Assessment and Plan have been scribed under the direction and in the presence of Robert Bellow, MD. Karie Fetch, RN  I have completed the exam and reviewed the above documentation for accuracy and completeness.  I agree with the above.  Haematologist has been used and any errors in dictation or transcription are unintentional.  Hervey Ard, M.D., F.A.C.S.  Robert Bellow 09/07/2017, 4:51 PM

## 2017-09-07 NOTE — Progress Notes (Signed)
HPI Andrea KINKEAD is a 71 y.o. female.  Here for evaluation of sudden abdominal pain and nausea. She states it feels like a "knife" right lower abdomen. She states it is constant and started yesterday. Nausea but no vomiting. She denies any injury or trauma. Bowels move regular, daily. She did go to [facility] and they feel it has something to do with her hernia mesh. Blood sugars running 200's today. She sees Dr Juleen China for nephrology. She is here with her husband, Carloyn Manner and great granddaughter.   HPI  Past Medical History:  Diagnosis Date  . Backache, unspecified    s/p c-spine and l-spine fusions  . Degeneration of intervertebral disc, site unspecified   . Depressive disorder, not elsewhere classified   . Disorder of bone and cartilage, unspecified   . GERD (gastroesophageal reflux disease)   . HTN (hypertension)   . Hx of left heart catheterization by cutdown    a. 7 years ago; no significant cad; b. Lexiscan 2014: no significant ischemia, no significant EKG changes concerning for ischemia, EF 67%, overall low risk study  . Kidney failure    Dr Cyndia Diver  . Myalgia and myositis, unspecified   . Obesity, unspecified   . Personal history of tobacco use, presenting hazards to health    1/2 ppd  . Plantar fascial fibromatosis   . S/P cholecystectomy   . S/p nephrectomy   . Type II or unspecified type diabetes mellitus without mention of complication, not stated as uncontrolled   . Unspecified essential hypertension   . Unspecified hereditary and idiopathic peripheral neuropathy    secondary to diabetes    Past Surgical History:  Procedure Laterality Date  . ABDOMINAL HYSTERECTOMY    . APPENDECTOMY    . BACK SURGERY     2006  . CARDIAC CATHETERIZATION     o.k. 2003  . CARPAL TUNNEL RELEASE    . CERVICAL DISCECTOMY    . CHOLECYSTECTOMY    . COLONOSCOPY  09/1999   colonoscopy-hemorrhoids, re-check 5 years (10/2006)  . dexa     osteopenia (01/2004)  . ELBOW SURGERY    .  FOOT SURGERY     x 3  . KIDNEY DONATION  1991  . LUMBAR FUSION    . NECK SURGERY     06/2005  . Problem with general Anesthesia     02/2006  . REPLACEMENT TOTAL KNEE  10/2004  . trigger finger surgery    . VESICOVAGINAL FISTULA CLOSURE W/ TAH    . VIDEO BRONCHOSCOPY Bilateral 04/02/2015   Procedure: VIDEO BRONCHOSCOPY WITHOUT FLUORO;  Surgeon: Juanito Doom, MD;  Location: [place];  Service: Cardiopulmonary;  Laterality: Bilateral;    Family History  Problem Relation Age of Onset  . Hypertension Mother   . Colon cancer Mother   . Cancer Mother        colon  . Coronary artery disease Mother   . Diabetes Mother   . Prostate cancer Father   . COPD Father   . Cancer Father        prostate  . Heart attack Brother 70  . Sleep apnea Brother   . Diabetes Paternal Aunt   . Diabetes Paternal Uncle   . Cancer Daughter        Brain, lungs, liver, spine, kidney    Social History Social History  Substance Use Topics  . Smoking status: Current Every Day Smoker    Packs/day: 0.25    Years: 30.00    Types:  Cigarettes  . Smokeless tobacco: Never Used     Comment: currently smoking .25 ppd, trying to quit 02/13/16  . Alcohol use No    Allergies  Allergen Reactions  . Morphine Anaphylaxis  . Prednisone Other (See Comments)    "makes me think I am having a heart attack"  . Gabapentin Other (See Comments)    intolerance  . Nsaids Diarrhea  . Penicillins Other (See Comments)    REACTION: rash, swelling. Remote reaction as a child to Pen V K. Patient did tolerate recent course of augmentin     Current Outpatient Prescriptions  Medication Sig Dispense Refill  . albuterol (PROVENTIL HFA) 108 (90 BASE) MCG/ACT inhaler Inhale 2 puffs into the lungs every 4 (four) hours as needed for wheezing or shortness of breath. 1 Inhaler 2  . amLODipine (NORVASC) 10 MG tablet TAKE 1 TABLET BY MOUTH EVERY DAY. 90 tablet 1  . aspirin 81 MG tablet Take 1 tablet (81 mg total) by mouth daily.     Marland Kitchen atorvastatin (LIPITOR) 20 MG tablet Take 1 tablet (20 mg total) by mouth daily. 90 tablet 0  . bisacodyl (DULCOLAX) 10 MG suppository Place 1 suppository (10 mg total) rectally as needed for moderate constipation. 12 suppository 0  . budesonide-formoterol (SYMBICORT) 160-4.5 MCG/ACT inhaler Inhale 2 puffs into the lungs 2 (two) times daily. 1 Inhaler 5  . calcium carbonate (OS-CAL) 600 MG TABS Take 600 mg by mouth daily.      . carvedilol (COREG) 6.25 MG tablet Take 1 tablet (6.25 mg total) by mouth 2 (two) times daily with a meal. 60 tablet 3  . co-enzyme Q-10 30 MG capsule Take 30 mg by mouth daily.    Marland Kitchen DAILY MULTIPLE VITAMINS PO Take 1 tablet by mouth daily.      . fluticasone (VERAMYST) 27.5 MCG/SPRAY nasal spray Place 2 sprays into the nose as needed for rhinitis or allergies.     Marland Kitchen insulin lispro (HUMALOG) 100 UNIT/ML injection Inject into the skin as directed. Sliding scale 100-150 = 2-2.5 units 150-200 = 3-3.5 units 200-250 = 4-4.5 units 251-300 = 5-5.5 units 301-350 = 6-6.5 units 350-400 = 7-7.5 units    . insulin NPH (HUMULIN N,NOVOLIN N) 100 UNIT/ML injection Inject 22 Units into the skin daily before breakfast. 24 units at bedtime    . irbesartan (AVAPRO) 150 MG tablet     . metFORMIN (GLUCOPHAGE-XR) 500 MG 24 hr tablet Take 500 mg by mouth 2 (two) times daily.     . metolazone (ZAROXOLYN) 2.5 MG tablet Take 1 tablet (2.5 mg total) by mouth daily. 30 mintues prior to torsemide 30 tablet 2  . mometasone (NASONEX) 50 MCG/ACT nasal spray Place 2 sprays into the nose daily as needed.     . nitroGLYCERIN (NITROSTAT) 0.4 MG SL tablet Place 1 tablet (0.4 mg total) under the tongue every 5 (five) minutes as needed. Maximum 3 tablets (Patient taking differently: Place 0.4 mg under the tongue every 5 (five) minutes as needed for chest pain. Maximum 3 tablets) 25 tablet 6  . sodium chloride (OCEAN) 0.65 % SOLN nasal spray Place 2 sprays into both nostrils as needed for congestion.    Marland Kitchen  Spacer/Aero-Holding Chambers (AEROCHAMBER MV) inhaler Use as instructed 1 each 0  . torsemide (DEMADEX) 5 MG tablet Take 1 tablet (5 mg total) by mouth daily. 30 tablet 2   No current facility-administered medications for this visit.     Review of Systems Review of Systems  Constitutional:  Negative.   Respiratory: Negative.   Gastrointestinal: Positive for abdominal pain and nausea. Negative for constipation and diarrhea.    Blood pressure 132/74, pulse 92, resp. rate 16, height 5\' 1"  (1.549 m), weight 202 lb (91.6 kg).  Physical Exam Physical Exam  Constitutional: She is oriented to person, place, and time. She appears well-developed and well-nourished.  HENT:  Mouth/Throat: Oropharynx is clear and moist.  Eyes: Conjunctivae are normal. No scleral icterus.  Neck: Neck supple.  Cardiovascular: Normal rate, regular rhythm and normal heart sounds.   Pulmonary/Chest: Effort normal and breath sounds normal.  No exacerbation of abdominal pain during deep inspiration.  Abdominal: Soft. Normal appearance and bowel sounds are normal. There is tenderness in the right lower quadrant. A hernia is present. Hernia confirmed positive in the ventral area.    5 cm mass below umbilicus that is tender.  Normal bowel sounds, no distention.  Nominal distention.  Musculoskeletal:  Right lower back pain with compression  Lymphadenopathy:    She has no cervical adenopathy.  Neurological: She is alert and oriented to person, place, and time.  Skin: Skin is warm and dry.  Psychiatric: Her behavior is normal.    Data Reviewed Laboratory studies dated July 22, 2017 showed a creatinine of 1.65, baseline dating back to May 2018.  Normal electrolytes.  July 25, 2017: hemoglobin A1c: 9.7.  January 03, 2017 urinalysis showed microalbuminuria: 742.5  CBC dated August 25, 2017 showed a white blood cell count of 7200 with 58% polys, 26% lymphocytes and 5.9% eosinophils.  Hemoglobin 12.2.   Platelet count 222,000.  Assessment    Clinical exam consistent with incarcerated ventral hernia, likely omentum.    Plan    The patient ate a [employer] chicken pie prior to presentation to the office. (In spite of nausea, she was hungry and wanted something "quick".)  This will require delay of surgery for 8 hours unless she clinically deteroiates. I would prefer not to operate at midnight.  We will obtain a noncontrast CT scan to confirm clinical impression of incarcerated omentum.  If this is the case, the patient will be admitted, will work on glucose control, analgesics and proceed to surgical intervention tomorrow while the son is above the horizon.      HPI, Physical Exam, Assessment and Plan have been scribed under the direction and in the presence of Robert Bellow, MD. Karie Fetch, RN  I have completed the exam and reviewed the above documentation for accuracy and completeness.  I agree with the above.  Haematologist has been used and any errors in dictation or transcription are unintentional.  Hervey Ard, M.D., F.A.C.S.

## 2017-09-08 ENCOUNTER — Inpatient Hospital Stay: Payer: Medicare Other | Admitting: Anesthesiology

## 2017-09-08 ENCOUNTER — Encounter
Admission: AD | Disposition: A | Discharge status: Home / Self Care | Payer: Self-pay | Source: Ambulatory Visit | Attending: General Surgery

## 2017-09-08 ENCOUNTER — Encounter: Payer: Self-pay | Admitting: *Deleted

## 2017-09-08 HISTORY — PX: VENTRAL HERNIA REPAIR: SHX424

## 2017-09-08 LAB — BASIC METABOLIC PANEL
ANION GAP: 7 (ref 5–15)
BUN: 33 mg/dL — ABNORMAL HIGH (ref 6–20)
CO2: 28 mmol/L (ref 22–32)
Calcium: 8.6 mg/dL — ABNORMAL LOW (ref 8.9–10.3)
Chloride: 105 mmol/L (ref 101–111)
Creatinine, Ser: 1.88 mg/dL — ABNORMAL HIGH (ref 0.44–1.00)
GFR, EST AFRICAN AMERICAN: 30 mL/min — AB (ref >60)
GFR, EST NON AFRICAN AMERICAN: 26 mL/min — AB (ref >60)
GLUCOSE: 141 mg/dL — AB (ref 65–99)
POTASSIUM: 3.7 mmol/L (ref 3.5–5.1)
Sodium: 140 mmol/L (ref 135–145)

## 2017-09-08 LAB — GLUCOSE, CAPILLARY
GLUCOSE-CAPILLARY: 195 mg/dL — AB (ref 65–99)
GLUCOSE-CAPILLARY: 303 mg/dL — AB (ref 65–99)
Glucose-Capillary: 244 mg/dL — ABNORMAL HIGH (ref 65–99)
Glucose-Capillary: 264 mg/dL — ABNORMAL HIGH (ref 65–99)

## 2017-09-08 LAB — HEMOGLOBIN A1C
Hgb A1c MFr Bld: 9.4 % — ABNORMAL HIGH (ref 4.8–5.6)
MEAN PLASMA GLUCOSE: 223.08 mg/dL

## 2017-09-08 SURGERY — REPAIR, HERNIA, VENTRAL
Anesthesia: General | Wound class: Clean

## 2017-09-08 MED ORDER — SODIUM CHLORIDE 0.9 % IV SOLN
INTRAVENOUS | Status: DC
  Administered 2017-09-08: 11:00:00 via INTRAVENOUS

## 2017-09-08 MED ORDER — CARVEDILOL 12.5 MG PO TABS
6.2500 mg | ORAL_TABLET | Freq: Every day | ORAL | Status: DC
Start: 2017-09-08 — End: 2017-09-08

## 2017-09-08 MED ORDER — OXYCODONE HCL 5 MG PO TABS: 5.0000 mg | ORAL_TABLET | ORAL | Status: DC | PRN

## 2017-09-08 MED ORDER — ACETAMINOPHEN 10 MG/ML IV SOLN
INTRAVENOUS | Status: AC
Start: 2017-09-08 — End: ?
  Filled 2017-09-08: qty 100

## 2017-09-08 MED ORDER — GLYCOPYRROLATE 0.2 MG/ML IJ SOLN
INTRAMUSCULAR | Status: DC | PRN
  Administered 2017-09-08: 0.2 mg via INTRAVENOUS
  Administered 2017-09-08: 0.4 mg via INTRAVENOUS

## 2017-09-08 MED ORDER — SUCCINYLCHOLINE CHLORIDE 20 MG/ML IJ SOLN
INTRAMUSCULAR | Status: DC | PRN
  Administered 2017-09-08: 100 mg via INTRAVENOUS

## 2017-09-08 MED ORDER — ONDANSETRON HCL 4 MG/2ML IJ SOLN
INTRAMUSCULAR | Status: DC | PRN
  Administered 2017-09-08: 4 mg via INTRAVENOUS

## 2017-09-08 MED ORDER — ROCURONIUM BROMIDE 100 MG/10ML IV SOLN
INTRAVENOUS | Status: DC | PRN
  Administered 2017-09-08: 20 mg via INTRAVENOUS

## 2017-09-08 MED ORDER — ACETAMINOPHEN 10 MG/ML IV SOLN
1000.0000 mg | Freq: Four times a day (QID) | INTRAVENOUS | Status: DC
  Administered 2017-09-08 – 2017-09-09 (×3): 1000 mg via INTRAVENOUS
  Filled 2017-09-08 (×4): qty 100

## 2017-09-08 MED ORDER — IPRATROPIUM-ALBUTEROL 0.5-2.5 (3) MG/3ML IN SOLN
3.0000 mL | Freq: Two times a day (BID) | RESPIRATORY_TRACT | Status: DC
  Administered 2017-09-09: 3 mL via RESPIRATORY_TRACT
  Filled 2017-09-08: qty 3

## 2017-09-08 MED ORDER — ADULT MULTIVITAMIN W/MINERALS CH
ORAL_TABLET | Freq: Every day | ORAL | Status: DC
  Administered 2017-09-08 – 2017-09-09 (×2): 1 via ORAL
  Filled 2017-09-08 (×2): qty 1

## 2017-09-08 MED ORDER — CHLORHEXIDINE GLUCONATE CLOTH 2 % EX PADS
6.0000 | MEDICATED_PAD | Freq: Every day | CUTANEOUS | Status: DC
  Administered 2017-09-08: 6 via TOPICAL

## 2017-09-08 MED ORDER — INSULIN NPH (HUMAN) (ISOPHANE) 100 UNIT/ML ~~LOC~~ SUSP
15.0000 [IU] | Freq: Every day | SUBCUTANEOUS | Status: DC
  Administered 2017-09-09: 15 [IU] via SUBCUTANEOUS
  Filled 2017-09-08 (×4): qty 10

## 2017-09-08 MED ORDER — BUPIVACAINE-EPINEPHRINE (PF) 0.5% -1:200000 IJ SOLN
INTRAMUSCULAR | Status: AC
  Filled 2017-09-08: qty 30

## 2017-09-08 MED ORDER — ONDANSETRON HCL 4 MG/2ML IJ SOLN
4.0000 mg | Freq: Once | INTRAMUSCULAR | Status: DC | PRN
Start: 2017-09-08 — End: 2017-09-08

## 2017-09-08 MED ORDER — ONDANSETRON HCL 4 MG/2ML IJ SOLN
INTRAMUSCULAR | Status: AC
  Filled 2017-09-08: qty 2

## 2017-09-08 MED ORDER — IRBESARTAN 150 MG PO TABS
150.0000 mg | ORAL_TABLET | Freq: Two times a day (BID) | ORAL | Status: DC
  Administered 2017-09-08 – 2017-09-09 (×2): 150 mg via ORAL
  Filled 2017-09-08 (×2): qty 1

## 2017-09-08 MED ORDER — FENTANYL CITRATE (PF) 100 MCG/2ML IJ SOLN
25.0000 ug | INTRAMUSCULAR | Status: DC | PRN
  Administered 2017-09-08 (×3): 25 ug via INTRAVENOUS

## 2017-09-08 MED ORDER — DEXAMETHASONE SODIUM PHOSPHATE 10 MG/ML IJ SOLN
INTRAMUSCULAR | Status: AC
  Filled 2017-09-08: qty 1

## 2017-09-08 MED ORDER — FENTANYL CITRATE (PF) 100 MCG/2ML IJ SOLN
INTRAMUSCULAR | Status: AC
  Filled 2017-09-08: qty 2

## 2017-09-08 MED ORDER — NEOSTIGMINE METHYLSULFATE 10 MG/10ML IV SOLN
INTRAVENOUS | Status: AC
Start: 2017-09-08 — End: ?
  Filled 2017-09-08: qty 1

## 2017-09-08 MED ORDER — DEXAMETHASONE SODIUM PHOSPHATE 10 MG/ML IJ SOLN
INTRAMUSCULAR | Status: DC | PRN
  Administered 2017-09-08: 5 mg via INTRAVENOUS

## 2017-09-08 MED ORDER — AMLODIPINE BESYLATE 10 MG PO TABS
10.0000 mg | ORAL_TABLET | Freq: Every day | ORAL | Status: DC
  Administered 2017-09-08 – 2017-09-09 (×2): 10 mg via ORAL
  Filled 2017-09-08 (×4): qty 1

## 2017-09-08 MED ORDER — CEFAZOLIN SODIUM-DEXTROSE 2-3 GM-%(50ML) IV SOLR
INTRAVENOUS | Status: DC | PRN
  Administered 2017-09-08: 2 g via INTRAVENOUS

## 2017-09-08 MED ORDER — ACETAMINOPHEN 10 MG/ML IV SOLN
INTRAVENOUS | Status: DC | PRN
  Administered 2017-09-08: 1000 mg via INTRAVENOUS

## 2017-09-08 MED ORDER — LIDOCAINE HCL (CARDIAC) 20 MG/ML IV SOLN
INTRAVENOUS | Status: DC | PRN
  Administered 2017-09-08: 100 mg via INTRAVENOUS

## 2017-09-08 MED ORDER — ENOXAPARIN SODIUM 30 MG/0.3ML ~~LOC~~ SOLN
30.0000 mg | SUBCUTANEOUS | Status: DC
  Administered 2017-09-08: 30 mg via SUBCUTANEOUS
  Filled 2017-09-08: qty 0.3

## 2017-09-08 MED ORDER — ATORVASTATIN CALCIUM 20 MG PO TABS
20.0000 mg | ORAL_TABLET | Freq: Every day | ORAL | Status: DC
  Administered 2017-09-08 – 2017-09-09 (×2): 20 mg via ORAL
  Filled 2017-09-08 (×2): qty 1

## 2017-09-08 MED ORDER — PROPOFOL 10 MG/ML IV BOLUS
INTRAVENOUS | Status: DC | PRN
  Administered 2017-09-08: 100 mg via INTRAVENOUS

## 2017-09-08 MED ORDER — LIDOCAINE HCL (PF) 2 % IJ SOLN
INTRAMUSCULAR | Status: AC
  Filled 2017-09-08: qty 10

## 2017-09-08 MED ORDER — FENTANYL CITRATE (PF) 100 MCG/2ML IJ SOLN
INTRAMUSCULAR | Status: AC
  Administered 2017-09-08: 25 ug via INTRAVENOUS
  Filled 2017-09-08: qty 2

## 2017-09-08 MED ORDER — FENTANYL CITRATE (PF) 100 MCG/2ML IJ SOLN
INTRAMUSCULAR | Status: DC | PRN
  Administered 2017-09-08: 50 ug via INTRAVENOUS

## 2017-09-08 MED ORDER — CALCIUM CARBONATE ANTACID 500 MG PO CHEW
500.0000 mg | CHEWABLE_TABLET | Freq: Every day | ORAL | Status: DC
  Administered 2017-09-08 – 2017-09-09 (×2): 500 mg via ORAL
  Filled 2017-09-08 (×2): qty 3

## 2017-09-08 MED ORDER — PROPOFOL 10 MG/ML IV BOLUS
INTRAVENOUS | Status: AC
  Filled 2017-09-08: qty 20

## 2017-09-08 MED ORDER — ROCURONIUM BROMIDE 50 MG/5ML IV SOLN
INTRAVENOUS | Status: AC
  Filled 2017-09-08: qty 1

## 2017-09-08 MED ORDER — METFORMIN HCL ER 500 MG PO TB24: 500.0000 mg | ORAL_TABLET | Freq: Two times a day (BID) | ORAL | Status: DC

## 2017-09-08 MED ORDER — TRAMADOL HCL 50 MG PO TABS: 50.0000 mg | ORAL_TABLET | ORAL | Status: DC | PRN

## 2017-09-08 MED ORDER — SUCCINYLCHOLINE CHLORIDE 20 MG/ML IJ SOLN
INTRAMUSCULAR | Status: AC
Start: 2017-09-08 — End: ?
  Filled 2017-09-08: qty 1

## 2017-09-08 MED ORDER — GLYCOPYRROLATE 0.2 MG/ML IJ SOLN
INTRAMUSCULAR | Status: AC
  Filled 2017-09-08: qty 2

## 2017-09-08 MED ORDER — BUPIVACAINE-EPINEPHRINE (PF) 0.5% -1:200000 IJ SOLN
INTRAMUSCULAR | Status: DC | PRN
  Administered 2017-09-08: 30 mL via PERINEURAL

## 2017-09-08 MED ORDER — NEOSTIGMINE METHYLSULFATE 10 MG/10ML IV SOLN
INTRAVENOUS | Status: DC | PRN
  Administered 2017-09-08: 5 mg via INTRAVENOUS

## 2017-09-08 SURGICAL SUPPLY — 38 items
BLADE SURG 15 STRL SS SAFETY (BLADE) ×3 IMPLANT
CANISTER SUCT 1200ML W/VALVE (MISCELLANEOUS) ×3 IMPLANT
CHLORAPREP W/TINT 26ML (MISCELLANEOUS) ×3 IMPLANT
CLOSURE WOUND 1/2 X4 (GAUZE/BANDAGES/DRESSINGS)
DRAIN CHANNEL JP 15F RND 16 (MISCELLANEOUS) ×2 IMPLANT
DRAPE CHEST BREAST 77X106 FENE (MISCELLANEOUS) ×1 IMPLANT
DRAPE LAPAROTOMY 100X77 ABD (DRAPES) ×3 IMPLANT
DRSG OPSITE POSTOP 4X6 (GAUZE/BANDAGES/DRESSINGS) ×2 IMPLANT
DRSG TEGADERM 4X4.75 (GAUZE/BANDAGES/DRESSINGS) ×2 IMPLANT
DRSG TELFA 3X8 NADH (GAUZE/BANDAGES/DRESSINGS) IMPLANT
ELECT REM PT RETURN 9FT ADLT (ELECTROSURGICAL) ×3
ELECTRODE REM PT RTRN 9FT ADLT (ELECTROSURGICAL) ×1 IMPLANT
GAUZE SPONGE 4X4 12PLY STRL (GAUZE/BANDAGES/DRESSINGS) ×1 IMPLANT
GLOVE BIO SURGEON STRL SZ7.5 (GLOVE) ×5 IMPLANT
GLOVE INDICATOR 8.0 STRL GRN (GLOVE) ×5 IMPLANT
GOWN STRL REUS W/ TWL LRG LVL3 (GOWN DISPOSABLE) ×2 IMPLANT
GOWN STRL REUS W/TWL LRG LVL3 (GOWN DISPOSABLE) ×6
KIT RM TURNOVER STRD PROC AR (KITS) ×3 IMPLANT
LABEL OR SOLS (LABEL) ×1 IMPLANT
NDL HYPO 25X1 1.5 SAFETY (NEEDLE) ×1 IMPLANT
NDL SAFETY 22GX1.5 (NEEDLE) ×3 IMPLANT
NEEDLE HYPO 25X1 1.5 SAFETY (NEEDLE) IMPLANT
NS IRRIG 500ML POUR BTL (IV SOLUTION) ×3 IMPLANT
PACK BASIN MINOR ARMC (MISCELLANEOUS) ×3 IMPLANT
PAD DRESSING TELFA 3X8 NADH (GAUZE/BANDAGES/DRESSINGS) ×1 IMPLANT
SPONGE LAP 18X18 5 PK (GAUZE/BANDAGES/DRESSINGS) ×3 IMPLANT
STAPLER SKIN PROX 35W (STAPLE) ×1 IMPLANT
STRIP CLOSURE SKIN 1/2X4 (GAUZE/BANDAGES/DRESSINGS) ×1 IMPLANT
SUT SURGILON 0 BLK (SUTURE) ×4 IMPLANT
SUT VIC AB 2-0 BRD 54 (SUTURE) ×3 IMPLANT
SUT VIC AB 2-0 CT1 (SUTURE) ×2 IMPLANT
SUT VIC AB 3-0 SH 27 (SUTURE)
SUT VIC AB 3-0 SH 27X BRD (SUTURE) ×1 IMPLANT
SUT VIC AB 4-0 FS2 27 (SUTURE) ×3 IMPLANT
SUT VICRYL+ 3-0 144IN (SUTURE) ×1 IMPLANT
SYR 3ML LL SCALE MARK (SYRINGE) ×1 IMPLANT
SYR CONTROL 10ML (SYRINGE) ×3 IMPLANT
TRAY FOLEY W/METER SILVER 16FR (SET/KITS/TRAYS/PACK) ×1 IMPLANT

## 2017-09-08 NOTE — Transfer of Care (Signed)
Immediate Anesthesia Transfer of Care Note  Patient: Andrea Stewart  Procedure(s) Performed: HERNIA REPAIR INCARCERATED VENTRAL ADULT (N/A )  Patient Location: PACU  Anesthesia Type:General  Level of Consciousness: awake and alert   Airway & Oxygen Therapy: Patient Spontanous Breathing and Patient connected to face mask oxygen  Post-op Assessment: Report given to RN and Post -op Vital signs reviewed and stable  Post vital signs: Reviewed and stable  Last Vitals:  Vitals:   09/08/17 1033 09/08/17 1113  BP: (!) 151/70 (!) 174/75  Pulse: 63 (!) 57  Resp: 12 16  Temp: 36.6 C 36.5 C  SpO2: 95% 98%    Last Pain:  Vitals:   09/08/17 1113  TempSrc: Tympanic  PainSc: 3       Patients Stated Pain Goal: 1 (40/68/40 3353)  Complications: No apparent anesthesia complications

## 2017-09-08 NOTE — Progress Notes (Signed)
[place] [facility] [place] NAME: Andrea Stewart    MR#:  [id number]  DATE OF BIRTH:  22-Sep-1946  SUBJECTIVE:  Doing OK. Npo for surgery Husband in the room  REVIEW OF SYSTEMS:   Review of Systems  Constitutional: Negative for chills, fever and weight loss.  HENT: Negative for ear discharge, ear pain and nosebleeds.   Eyes: Negative for blurred vision, pain and discharge.  Respiratory: Negative for sputum production, shortness of breath, wheezing and stridor.   Cardiovascular: Negative for chest pain, palpitations, orthopnea and PND.  Gastrointestinal: Positive for abdominal pain. Negative for diarrhea, nausea and vomiting.  Genitourinary: Negative for frequency and urgency.  Musculoskeletal: Negative for back pain and joint pain.  Neurological: Positive for weakness. Negative for sensory change, speech change and focal weakness.  Psychiatric/Behavioral: Negative for depression and hallucinations. The patient is not nervous/anxious.    Tolerating Diet:npoTolerating PT: penidng  DRUG ALLERGIES:   Allergies  Allergen Reactions  . Morphine Anaphylaxis  . Prednisone Other (See Comments)    "makes me think I am having a heart attack"  . Gabapentin Other (See Comments)    intolerance  . Nsaids Diarrhea  . Penicillins Swelling    Has patient had a PCN reaction causing immediate rash, facial/tongue/throat swelling, SOB or lightheadedness with hypotension: Yes Has patient had a PCN reaction causing severe rash involving mucus membranes or skin necrosis: No Has patient had a PCN reaction that required hospitalization: No Has patient had a PCN reaction occurring within the last 10 years: No If all of the above answers are "NO", then may proceed with Cephalosporin use.     VITALS:  Blood pressure (!) 174/75, pulse (!) 57, temperature 97.7 F (36.5 C), temperature source Tympanic, resp. rate 16, height 5\' 1"  (1.549 m), weight 90.7 kg (200  lb), SpO2 98 %.  PHYSICAL EXAMINATION:   Physical Exam  GENERAL:  71 y.o.-year-old patient lying in the bed with no acute distress. obese EYES: Pupils equal, round, reactive to light and accommodation. No scleral icterus. Extraocular muscles intact.  HEENT: Head atraumatic, normocephalic. Oropharynx and nasopharynx clear.  NECK:  Supple, no jugular venous distention. No thyroid enlargement, no tenderness.  LUNGS: Normal breath sounds bilaterally, no wheezing, rales, rhonchi. No use of accessory muscles of respiration.  CARDIOVASCULAR: S1, S2 normal. No murmurs, rubs, or gallops.  ABDOMEN: Soft, + tender eigastric, nondistended. Bowel sounds present. No organomegaly or mass.  EXTREMITIES: No cyanosis, clubbing or edema b/l.    NEUROLOGIC: Cranial nerves II through XII are intact. No focal Motor or sensory deficits b/l.   PSYCHIATRIC:  patient is alert and oriented x 3.  SKIN: No obvious rash, lesion, or ulcer.   LABORATORY PANEL:  CBC  Recent Labs Lab 09/07/17 1845  WBC 8.2  HGB 12.8  HCT 37.8  PLT 238    Chemistries   Recent Labs Lab 09/08/17 0344  NA 140  K 3.7  CL 105  CO2 28  GLUCOSE 141*  BUN 33*  CREATININE 1.88*  CALCIUM 8.6*   Cardiac Enzymes No results for input(s): TROPONINI in the last 168 hours. RADIOLOGY:  Ct Abdomen Pelvis Wo Contrast  Result Date: 09/07/2017 CLINICAL DATA:  Sudden onset sharp right lower quadrant abdominal pain yesterday. Continued pain today with nausea. EXAM: CT ABDOMEN AND PELVIS WITHOUT CONTRAST TECHNIQUE: Multidetector CT imaging of the abdomen and pelvis was performed following the standard protocol without IV contrast. COMPARISON:  Chest and abdomen radiographs dated 07/19/2015.  FINDINGS: Lower chest: Minimal bibasilar linear atelectasis or scarring. Hepatobiliary: Surgically absent gallbladder. Normal appearing liver. Pancreas: Mild diffuse pancreatic atrophy. Spleen: Normal in size without focal abnormality. Adrenals/Urinary  Tract: Post lumpectomy changes on the left. Normal appearing adrenal glands, right kidney, right ureter and urinary bladder. Stomach/Bowel: Surgically absent appendix. Unremarkable stomach, small bowel and colon. Vascular/Lymphatic: Atheromatous arterial calcifications without aneurysm. No enlarged lymph nodes. Reproductive: Status post hysterectomy. No adnexal masses. Other: Moderate-sized infraumbilical hernia containing fat and fluid. The herniated fat and fluid measures 6 cm in maximum diameter. The hernia defect measures 1.4 cm in diameter. Musculoskeletal: Lumbar and lower thoracic spine degenerative changes. Ray cages at the L4-5 and L5-S1 levels. There are also laminectomy defects at those levels with posterior bone fusion. IMPRESSION: 1. Moderate-sized infraumbilical hernia containing fat and fluid. The fluid could be an indication of inflammation or vascular compromise of the herniated fat. 2. Otherwise, no acute abnormality. Electronically Signed   By: Claudie Revering M.D.   On: 09/07/2017 18:54   ASSESSMENT AND PLAN:   71 y f with known history of HTN, DM, COPD and active smoker admitted for Ventral Hernia Repair (likely tomorrow)  * Ventral Hernia  -Preop medical clearance -Mild-moderate risk considering her age, active smoking, COPD, HTN, uncontrolled DM - d/w Dr Bary Castilla  * HTN - Resume Coreg as her BP is high - adjust as need  * Uncontrolled DM - Last HbA1c 9.7 (07/25/17), will recheck - while she is NPO, will keep her on SSI - DM nurse coordinator  * COPD - active smoker, counseled for 4 mins, denies any need for nicotine replacement therapy at this time - add Duonebs for now  Case discussed with Care Management/Social Worker. Management plans discussed with the patient, family and they are in agreement.  CODE STATUS: full code  DVT Prophylaxis: scd  TOTAL TIME TAKING CARE OF THIS PATIENT: 30 minutes.  >50% time spent on counselling and coordination of  care  POSSIBLE D/C IN *2-3 DAYS, DEPENDING ON CLINICAL CONDITION.  Note: This dictation was prepared with Dragon dictation along with smaller phrase technology. Any transcriptional errors that result from this process are unintentional.  Aedan Geimer M.D on 09/08/2017 at 12:30 PM  Between 7am to 6pm - Pager - [phone]  After 6pm go to [email or web address] - password [email or web address] [facility]  Office  [phone]  CC: Primary care physician; Crecencio Mc, MD

## 2017-09-08 NOTE — Op Note (Signed)
Preoperative diagnosis: Ventral hernia with incarcerated omentum.  Postoperative diagnosis: Same.  Operative procedure: Repair of ventral hernia.  Operating Surgeon: Hervey Ard, MD.  Anesthesia: General endotracheal, Marcaine 0.5% with 1-200,000 units of epinephrine, 30 cc.  Estimated blood loss: 5 cc.  Clinical note: This 71 year old woman presented yesterday with a 1 day history of abdominal pain and nausea.  Examination showed evidence of a nonreducible mass just below the umbilicus.  CT scan showed incarcerated omentum.  She is brought to the operating room today for planned hernia repair.  The patient received Kefzol prior to the procedure.  SCD stockings for DVT prevention.  Operative note: With the patient under adequate general endotracheal anesthesia the abdomen was cleansed with ChloraPrep and draped.  Marcaine was infiltrated for postoperative analgesia.  A vertical incision from the base of the umbilicus was extended inferiorly approximately 6 cm.  The skin was incised sharply.  The hernia sac was entered and clear odorless fluid noted.  The hernia sac was dissected free from the adjacent soft tissues at the left and transected at the level of the fascia.  The hernia sac was discarded.  The redundant omentum was ligated with 2-0 Vicryl ties.  It was then possible to reduce the remnant of omentum" through the defect which measured perhaps 10 x 15 mm in size.  The undersurface of the fascia was cleared and interrupted 0 Surgilon sutures were placed and tied sequentially.  The soft tissue defect was approximated with interrupted 2-0 Vicryl figure-of-eight sutures to obliterate the dead space.  A second layer of 2-0 Vicryl running sutures were used to approximate subcutaneous fat.  The skin was closed with a running 4-0 Vicryl subtalar suture.  Benzoin, Steri-Strips followed by honeycomb dressing applied.  The patient tolerated the procedure well.  Moderate coughing during  extubation.  She was taken to recovery room in stable condition.

## 2017-09-08 NOTE — Anesthesia Preprocedure Evaluation (Signed)
Anesthesia Evaluation  Patient identified by MRN, date of birth, ID band Patient awake    Reviewed: Allergy & Precautions, H&P , NPO status , Patient's Chart, lab work & pertinent test results, reviewed documented beta blocker date and time   Airway Mallampati: II  TM Distance: >3 FB Neck ROM: full    Dental  (+) Teeth Intact   Pulmonary neg pulmonary ROS, pneumonia, COPD,  COPD inhaler, Current Smoker,    Pulmonary exam normal        Cardiovascular Exercise Tolerance: Poor hypertension, + CAD  negative cardio ROS Normal cardiovascular exam Rhythm:regular Rate:Normal     Neuro/Psych PSYCHIATRIC DISORDERS  Neuromuscular disease negative neurological ROS  negative psych ROS   GI/Hepatic negative GI ROS, Neg liver ROS,   Endo/Other  negative endocrine ROSdiabetes  Renal/GU Renal diseasenegative Renal ROS  negative genitourinary   Musculoskeletal   Abdominal   Peds  Hematology negative hematology ROS (+)   Anesthesia Other Findings Past Medical History: No date: Backache, unspecified     Comment:  s/p c-spine and l-spine fusions No date: Degeneration of intervertebral disc, site unspecified No date: Depressive disorder, not elsewhere classified No date: Disorder of bone and cartilage, unspecified No date: HTN (hypertension) No date: Hx of left heart catheterization by cutdown     Comment:  a. 7 years ago; no significant cad; b. 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 No date: LUMBAR FUSION No date: NECK SURGERY     Comment:  06/2005 No date: Problem with general Anesthesia     Comment:  02/2006 10/2004: REPLACEMENT TOTAL KNEE No date: trigger finger surgery No date: VESICOVAGINAL FISTULA CLOSURE W/ TAH 04/02/2015: VIDEO BRONCHOSCOPY; Bilateral     Comment:  Procedure: VIDEO BRONCHOSCOPY WITHOUT FLUORO;  Surgeon:               Juanito Doom, MD;  Location: [facility] ENDOSCOPY;  Service:              Cardiopulmonary;  Laterality: Bilateral; BMI    Body Mass Index:  37.79 kg/m     Reproductive/Obstetrics negative OB ROS  Anesthesia Physical Anesthesia Plan  ASA: III  Anesthesia Plan: General ETT   Post-op Pain Management:    Induction:   PONV Risk Score and Plan:   Airway Management Planned:   Additional Equipment:   Intra-op Plan:   Post-operative Plan:   Informed Consent: I have reviewed the patients History and Physical, chart, labs and discussed the procedure including the risks, benefits and alternatives for the proposed anesthesia with the patient or authorized representative who has indicated his/her understanding and acceptance.   Dental Advisory Given  Plan  Discussed with: CRNA  Anesthesia Plan Comments:         Anesthesia Quick Evaluation

## 2017-09-08 NOTE — Progress Notes (Signed)
Inpatient Diabetes Program Recommendations  AACE/ADA: New Consensus Statement on Inpatient Glycemic Control (2015)  Target Ranges:  Prepandial:   less than 140 mg/dL      Peak postprandial:   less than 180 mg/dL (1-2 hours)      Critically ill patients:  140 - 180 mg/dL   Lab Results  Component Value Date   GLUCAP 160 (H) 09/07/2017   HGBA1C 9.4 (H) 09/07/2017    Review of Glycemic Control  Results for Andrea Stewart, Andrea Stewart (MRN [id number]) as of 09/08/2017 09:02  Ref. Range 09/07/2017 21:28  Glucose-Capillary Latest Ref Range: 65 - 99 mg/dL 160 (H)    Diabetes history: Type 2 Outpatient Diabetes medications: Humalog sliding scale, NPH 22 units qam, NPH 24 units qpm, Metformin 500mg  bid Current orders for Inpatient glycemic control: Novolog 0-20 units tid, Novolog 0-5 units qhs  Inpatient Diabetes Program Recommendations: Continuous glucose monitor reading of 170 mg/dl this am but patient refused insulin stating she is a "brittle diabetic"  GFR 26, BUN 33 Creatinine 1.88- recommend decrease Novolog correction to 0-9 units tid, add Novolog 3 units tid with meals (hold if patient eats less than 50%) and add Levemir 11 units qam, Levemir 12 units qpm post surgery  Spoke to patient and her family prior to surgery regarding the importance of ideal blood sugar control for healing, & reduction of infection.  I also discussed the importance of taking insulin as ordered to maintain CBG in an ideal range and how insulin is ordered for blood sugar management in the hospital. Patient acknowledged understanding and had no questions.    Gentry Fitz, RN, BA, MHA, CDE Diabetes Coordinator Inpatient Diabetes Program  [phone] (Team Pager) [phone] ([place]) 09/08/2017 9:14 AM

## 2017-09-08 NOTE — Anesthesia Procedure Notes (Signed)
Procedure Name: Intubation Date/Time: 09/08/2017 12:17 PM Performed by: Johnna Acosta Pre-anesthesia Checklist: Patient identified, Emergency Drugs available, Suction available, Timeout performed and Patient being monitored Patient Re-evaluated:Patient Re-evaluated prior to induction Oxygen Delivery Method: Circle system utilized Preoxygenation: Pre-oxygenation with 100% oxygen Induction Type: IV induction Ventilation: Mask ventilation with difficulty, Two handed mask ventilation required and Oral airway inserted - appropriate to patient size Laryngoscope Size: McGraph and 3 Grade View: Grade II Tube type: Oral Number of attempts: 1 Airway Equipment and Method: Stylet and Video-laryngoscopy Placement Confirmation: ETT inserted through vocal cords under direct vision,  positive ETCO2 and breath sounds checked- equal and bilateral Secured at: 20 cm Tube secured with: Tape Dental Injury: Teeth and Oropharynx as per pre-operative assessment  Difficulty Due To: Difficulty was anticipated, Difficult Airway- due to large tongue, Difficult Airway- due to anterior larynx and Difficult Airway- due to limited oral opening Future Recommendations: Recommend- induction with short-acting agent, and alternative techniques readily available

## 2017-09-08 NOTE — Anesthesia Post-op Follow-up Note (Signed)
Anesthesia QCDR form completed.        

## 2017-09-08 NOTE — Progress Notes (Signed)
AVSS. Reports multiple voids.  No vomiting. Lungs: Clear. ABD: Stable mass with incarcerated omentum. Labs: Creatinine down to 1.8. BS stable.  Plan: OR today.

## 2017-09-09 ENCOUNTER — Encounter: Payer: Self-pay | Admitting: General Surgery

## 2017-09-09 ENCOUNTER — Ambulatory Visit: Payer: Medicare Other | Admitting: Pulmonary Disease

## 2017-09-09 LAB — GLUCOSE, CAPILLARY
GLUCOSE-CAPILLARY: 469 mg/dL — AB (ref 65–99)
GLUCOSE-CAPILLARY: 424 mg/dL — AB (ref 65–99)
Glucose-Capillary: 382 mg/dL — ABNORMAL HIGH (ref 65–99)
Glucose-Capillary: 443 mg/dL — ABNORMAL HIGH (ref 65–99)

## 2017-09-09 MED ORDER — PROPOFOL 10 MG/ML IV BOLUS
INTRAVENOUS | Status: AC
  Filled 2017-09-09: qty 40

## 2017-09-09 MED ORDER — INSULIN ASPART 100 UNIT/ML ~~LOC~~ SOLN: 25.0000 [IU] | Freq: Once | SUBCUTANEOUS | Status: DC

## 2017-09-09 MED ORDER — INSULIN ASPART 100 UNIT/ML ~~LOC~~ SOLN
20.0000 [IU] | Freq: Once | SUBCUTANEOUS | Status: AC
  Administered 2017-09-09: 20 [IU] via SUBCUTANEOUS
  Filled 2017-09-09: qty 1

## 2017-09-09 NOTE — Progress Notes (Signed)
[place] at [place] NAME: Andrea Stewart    MR#:  [id number]  DATE OF BIRTH:  04-04-1946  SUBJECTIVE:  Pt's sugars high this am--she is a bit frustrated since the insulin was given late  At night Husband in the room Ate good BF REVIEW OF SYSTEMS:   Review of Systems  Constitutional: Negative for chills, fever and weight loss.  HENT: Negative for ear discharge, ear pain and nosebleeds.   Eyes: Negative for blurred vision, pain and discharge.  Respiratory: Negative for sputum production, shortness of breath, wheezing and stridor.   Cardiovascular: Negative for chest pain, palpitations, orthopnea and PND.  Gastrointestinal: Positive for abdominal pain. Negative for diarrhea, nausea and vomiting.  Genitourinary: Negative for frequency and urgency.  Musculoskeletal: Negative for back pain and joint pain.  Neurological: Positive for weakness. Negative for sensory change, speech change and focal weakness.  Psychiatric/Behavioral: Negative for depression and hallucinations. The patient is not nervous/anxious.    Tolerating Diet:yes Tolerating TK:[id number]  DRUG ALLERGIES:   Allergies  Allergen Reactions  . Morphine Anaphylaxis  . Prednisone Other (See Comments)    "makes me think I am having a heart attack"  . Gabapentin Other (See Comments)    intolerance  . Nsaids Diarrhea  . Penicillins Swelling    Has patient had a PCN reaction causing immediate rash, facial/tongue/throat swelling, SOB or lightheadedness with hypotension: Yes Has patient had a PCN reaction causing severe rash involving mucus membranes or skin necrosis: No Has patient had a PCN reaction that required hospitalization: No Has patient had a PCN reaction occurring within the last 10 years: No If all of the above answers are "NO", then may proceed with Cephalosporin use.     VITALS:  Blood pressure (!) 147/68, pulse 87, temperature 98.2 F (36.8 C), resp. rate  18, height 5\' 1"  (1.549 m), weight 90.7 kg (200 lb), SpO2 96 %.  PHYSICAL EXAMINATION:   Physical Exam  GENERAL:  71 y.o.-year-old patient lying in the bed with no acute distress. obese EYES: Pupils equal, round, reactive to light and accommodation. No scleral icterus. Extraocular muscles intact.  HEENT: Head atraumatic, normocephalic. Oropharynx and nasopharynx clear.  NECK:  Supple, no jugular venous distention. No thyroid enlargement, no tenderness.  LUNGS: Normal breath sounds bilaterally, no wheezing, rales, rhonchi. No use of accessory muscles of respiration.  CARDIOVASCULAR: S1, S2 normal. No murmurs, rubs, or gallops.  ABDOMEN: Soft, + tender eigastric, nondistended. Bowel sounds present. No organomegaly or mass. Surgical dressing+ EXTREMITIES: No cyanosis, clubbing or edema b/l.    NEUROLOGIC: Cranial nerves II through XII are intact. No focal Motor or sensory deficits b/l.   PSYCHIATRIC:  patient is alert and oriented x 3.  SKIN: No obvious rash, lesion, or ulcer.   LABORATORY PANEL:  CBC  Recent Labs Lab 09/07/17 1845  WBC 8.2  HGB 12.8  HCT 37.8  PLT 238    Chemistries   Recent Labs Lab 09/08/17 0344  NA 140  K 3.7  CL 105  CO2 28  GLUCOSE 141*  BUN 33*  CREATININE 1.88*  CALCIUM 8.6*   Cardiac Enzymes No results for input(s): TROPONINI in the last 168 hours. RADIOLOGY:  Ct Abdomen Pelvis Wo Contrast  Result Date: 09/07/2017 CLINICAL DATA:  Sudden onset sharp right lower quadrant abdominal pain yesterday. Continued pain today with nausea. EXAM: CT ABDOMEN AND PELVIS WITHOUT CONTRAST TECHNIQUE: Multidetector CT imaging of the abdomen and pelvis was performed following the standard  protocol without IV contrast. COMPARISON:  Chest and abdomen radiographs dated 07/19/2015. FINDINGS: Lower chest: Minimal bibasilar linear atelectasis or scarring. Hepatobiliary: Surgically absent gallbladder. Normal appearing liver. Pancreas: Mild diffuse pancreatic atrophy.  Spleen: Normal in size without focal abnormality. Adrenals/Urinary Tract: Post lumpectomy changes on the left. Normal appearing adrenal glands, right kidney, right ureter and urinary bladder. Stomach/Bowel: Surgically absent appendix. Unremarkable stomach, small bowel and colon. Vascular/Lymphatic: Atheromatous arterial calcifications without aneurysm. No enlarged lymph nodes. Reproductive: Status post hysterectomy. No adnexal masses. Other: Moderate-sized infraumbilical hernia containing fat and fluid. The herniated fat and fluid measures 6 cm in maximum diameter. The hernia defect measures 1.4 cm in diameter. Musculoskeletal: Lumbar and lower thoracic spine degenerative changes. Ray cages at the L4-5 and L5-S1 levels. There are also laminectomy defects at those levels with posterior bone fusion. IMPRESSION: 1. Moderate-sized infraumbilical hernia containing fat and fluid. The fluid could be an indication of inflammation or vascular compromise of the herniated fat. 2. Otherwise, no acute abnormality. Electronically Signed   By: Claudie Revering M.D.   On: 09/07/2017 18:54   ASSESSMENT AND PLAN:   71 y f with known history of HTN, DM, COPD and active smoker admitted for Ventral Hernia Repair (likely tomorrow)  * Ventral Hernia  -POD#1  Overall doing well -Mild-moderate risk considering her age, active smoking, COPD, HTN, uncontrolled DM - d/w Dr Bary Castilla  * HTN - Resume Coreg as her BP is high - adjust as need  * Uncontrolled DM - Last HbA1c 9.7 (07/25/17), will recheck - giving extra Insulin asprat to bring sugars down from 400's this am. - pt  explained to go back on her regimen at home  * COPD - active smoker, counseled for 4 mins, denies any need for nicotine replacement therapy at this time - add Duonebs for now  D/c from medical standpoint once sugars are better.   Case discussed with Care Management/Social Worker. Management plans discussed with the patient, family and they are in  agreement.  CODE STATUS: full code  DVT Prophylaxis: scd  TOTAL TIME TAKING CARE OF THIS PATIENT: 30 minutes.  >50% time spent on counselling and coordination of care  Note: This dictation was prepared with Dragon dictation along with smaller phrase technology. Any transcriptional errors that result from this process are unintentional.  Zeth Buday M.D on 09/09/2017 at 9:51 AM  Between 7am to 6pm - Pager - [phone]  After 6pm go to [email or web address] - password [email or web address] [place] [facility]  Office  [phone]  CC: Primary care physician; Crecencio Mc, MD

## 2017-09-09 NOTE — Care Management Important Message (Signed)
Important Message  Patient Details  Name: LATRAVIA SOUTHGATE MRN: [id number] Date of Birth: Sep 08, 1946   Medicare Important Message Given:  N/A - LOS <3 / Initial given by admissions    Beverly Sessions, RN 09/09/2017, 11:43 AM

## 2017-09-09 NOTE — Anesthesia Postprocedure Evaluation (Signed)
Anesthesia Post Note  Patient: AASTHA DAYLEY  Procedure(s) Performed: HERNIA REPAIR INCARCERATED VENTRAL ADULT (N/A )  Patient location during evaluation: PACU Anesthesia Type: General Level of consciousness: awake and alert Pain management: pain level controlled Vital Signs Assessment: post-procedure vital signs reviewed and stable Respiratory status: spontaneous breathing, nonlabored ventilation, respiratory function stable and patient connected to nasal cannula oxygen Cardiovascular status: blood pressure returned to baseline and stable Postop Assessment: no apparent nausea or vomiting Anesthetic complications: no     Last Vitals:  Vitals:   09/09/17 0720 09/09/17 0737  BP:  (!) 147/68  Pulse:  87  Resp:  18  Temp:  36.8 C  SpO2: 94% 96%    Last Pain:  Vitals:   09/09/17 0818  TempSrc:   PainSc: 2                  Molli Barrows

## 2017-09-09 NOTE — Progress Notes (Signed)
Inpatient Diabetes Program Recommendations  AACE/ADA: New Consensus Statement on Inpatient Glycemic Control (2015)  Target Ranges:  Prepandial:   less than 140 mg/dL      Peak postprandial:   less than 180 mg/dL (1-2 hours)      Critically ill patients:  140 - 180 mg/dL   Lab Results  Component Value Date   GLUCAP 382 (H) 09/09/2017   HGBA1C 9.4 (H) 09/07/2017    Results for VICIE, CECH (MRN [id number]) as of 09/09/2017 08:41  Ref. Range 09/08/2017 11:05 09/08/2017 13:12 09/08/2017 16:17 09/08/2017 21:53 09/09/2017 07:40  Glucose-Capillary Latest Ref Range: 65 - 99 mg/dL 195 (H) 244 (H) 264 (H) 303 (H) 382 (H)   Diabetes history: Type 2 Outpatient Diabetes medications: Humalog sliding scale, NPH 22 units qam, NPH 24 units qpm, Metformin 500mg  bid  Current orders for Inpatient glycemic control: Novolog 0-20 units tid, Novolog 0-5 units qhs, NPH 15 units qhs  Inpatient Diabetes Program Recommendations:  GFR 26, BUN 33 Creatinine 1.88- recommend decrease Novolog correction to 0-9 units tid, add Novolog 3 units tid with meals (hold if patient eats less than 50%) and add NPH 11 units qam. Continue NPH 15 units qam and Novolog 0-5 units qhs.  Spoke to patient and her family prior to surgery regarding the importance of ideal blood sugar control for healing, & reduction of infection.  I also discussed the importance of taking insulin as ordered to maintain CBG in an ideal range and how insulin is ordered for blood sugar management in the hospital. Patient acknowledged understanding and had no questions.     Gentry Fitz, RN, BA, MHA, CDE Diabetes Coordinator Inpatient Diabetes Program  [phone] (Team Pager) [phone] ([place]) 09/09/2017 8:47 AM

## 2017-09-09 NOTE — Progress Notes (Signed)
Patient called me back after she left.  She just wanted to clarify the information I had wrote on the discharge instructions.  I talked to her about the insulin that had been given to her and what her FSBSs were.

## 2017-09-09 NOTE — Progress Notes (Signed)
MD ordered patient to be discharged home.  Discharge instructions were reviewed with the patient and she voiced understanding.  Follow-up appointment was made.  No prescriptions given to the patient.  IV was removed with catheter intact. Patient's blood sugar was up all morning long.  She was upset about it.  Our AD and myself listened to her concerns and told her we would follow-up.  We talked to her about staying a little longer to keep monitor on her blood sugars due to the amount of insulin we had given her earlier.  She just kept saying all she wanted to do was go home and she would call her endocrinologist and take care of her blood sugars herself.  She refused to take any more insulin.  All other questions were answered.  Patient left via wheelchair escorted by auxillary.

## 2017-09-09 NOTE — Progress Notes (Signed)
Talked to Dr. Posey Pronto to let her know the patients FSBS went up to 446, even after she received 20 units of Novolog.  She gave me a verbal order for another 20 units of Novolog.

## 2017-09-12 NOTE — Telephone Encounter (Signed)
Mailed unread message to patient. thanks 

## 2017-09-13 NOTE — Discharge Summary (Signed)
Physician Discharge Summary  Patient ID: Andrea Stewart MRN: [id number] DOB/AGE: Mar 19, 1946 71 y.o.  Admit date: 09/07/2017 Discharge date: 09/13/2017  Admission Diagnoses: Incarcerated ventral hernia  Discharge Diagnoses:  Active Problems:   Ventral hernia Brittle diabetes mellitus  Discharged Condition: good  Hospital Course: Admitted with abdominal pain and nausea. CT showed incarcerated omentum. Taken to the OR and underwent primary repair. Postoperatively did well, BS did trend high.   Consults: Hospitalist  Significant Diagnostic Studies: labs:    Treatments: IV hydration and insulin: regular  Discharge Exam: Blood pressure (!) 115/44, pulse (!) 58, temperature (!) 97.5 F (36.4 C), temperature source Oral, resp. rate 18, height 5\' 1"  (1.549 m), weight 200 lb (90.7 kg), SpO2 94 %. Resp: clear to auscultation bilaterally Cardio: regular rate and rhythm, S1, S2 normal, no murmur, click, rub or gallop GI: soft, non-tender; bowel sounds normal; no masses,  no organomegaly Incision/Wound:Clean and dry.   Disposition: 01-Home or Self Care  Discharge Instructions    Diet - low sodium heart healthy    Complete by:  As directed    Discharge instructions    Complete by:  As directed    Okay to shower at any time.  The outer plastic dressing may be removed in 3 days if desired.  Apply ice to area intermittently today and tomorrow to control soreness and minimize swelling.  Tylenol if needed for soreness.  Ultram as needed for pain.  No driving until pain free.  No lifting over 10 pounds.   Increase activity slowly    Complete by:  As directed      Allergies as of 09/09/2017      Reactions   Morphine Anaphylaxis   Prednisone Other (See Comments)   "makes me think I am having a heart attack"   Gabapentin Other (See Comments)   intolerance   Nsaids Diarrhea   Penicillins Swelling   Has patient had a PCN reaction causing immediate rash, facial/tongue/throat  swelling, SOB or lightheadedness with hypotension: Yes Has patient had a PCN reaction causing severe rash involving mucus membranes or skin necrosis: No Has patient had a PCN reaction that required hospitalization: No Has patient had a PCN reaction occurring within the last 10 years: No If all of the above answers are "NO", then may proceed with Cephalosporin use.      Medication List    TAKE these medications   AEROCHAMBER MV inhaler Use as instructed   albuterol 108 (90 Base) MCG/ACT inhaler Commonly known as:  PROVENTIL HFA Inhale 2 puffs into the lungs every 4 (four) hours as needed for wheezing or shortness of breath.   amLODipine 10 MG tablet Commonly known as:  NORVASC TAKE 1 TABLET BY MOUTH EVERY DAY.   aspirin 81 MG tablet Take 1 tablet (81 mg total) by mouth daily.   atorvastatin 20 MG tablet Commonly known as:  LIPITOR Take 1 tablet (20 mg total) by mouth daily.   bisacodyl 10 MG suppository Commonly known as:  DULCOLAX Place 1 suppository (10 mg total) rectally as needed for moderate constipation.   budesonide-formoterol 160-4.5 MCG/ACT inhaler Commonly known as:  SYMBICORT Inhale 2 puffs into the lungs 2 (two) times daily.   calcium carbonate 600 MG Tabs tablet Commonly known as:  OS-CAL Take 600 mg by mouth daily.   carvedilol 6.25 MG tablet Commonly known as:  COREG Take 1 tablet (6.25 mg total) by mouth 2 (two) times daily with a meal. What changed:  when to take  this   co-enzyme Q-10 30 MG capsule Take 30 mg by mouth daily.   DAILY MULTIPLE VITAMINS PO Take 1 tablet by mouth daily.   fluticasone 27.5 MCG/SPRAY nasal spray Commonly known as:  VERAMYST Place 2 sprays into the nose as needed for rhinitis or allergies.   HUMALOG 100 UNIT/ML injection Generic drug:  insulin lispro Inject into the skin as directed. Sliding scale 100-150 = 2-2.5 units 150-200 = 3-3.5 units 200-250 = 4-4.5 units 251-300 = 5-5.5 units 301-350 = 6-6.5 units 350-400 =  7-7.5 units   insulin NPH Human 100 UNIT/ML injection Commonly known as:  HUMULIN N,NOVOLIN N Inject 22 Units into the skin daily before breakfast. 24 units at bedtime   irbesartan 150 MG tablet Commonly known as:  AVAPRO Take 150 mg by mouth 2 (two) times daily.   metFORMIN 500 MG 24 hr tablet Commonly known as:  GLUCOPHAGE-XR Take 500 mg by mouth 2 (two) times daily.   metolazone 2.5 MG tablet Commonly known as:  ZAROXOLYN Take 1 tablet (2.5 mg total) by mouth daily. 30 mintues prior to torsemide   mometasone 50 MCG/ACT nasal spray Commonly known as:  NASONEX Place 2 sprays into the nose daily as needed.   nitroGLYCERIN 0.4 MG SL tablet Commonly known as:  NITROSTAT Place 1 tablet (0.4 mg total) under the tongue every 5 (five) minutes as needed. Maximum 3 tablets What changed:  reasons to take this  additional instructions   sodium chloride 0.65 % Soln nasal spray Commonly known as:  OCEAN Place 2 sprays into both nostrils as needed for congestion.   torsemide 5 MG tablet Commonly known as:  DEMADEX Take 1 tablet (5 mg total) by mouth daily.      Follow-up Information    Byrnett, Forest Gleason, MD. Go on 09/22/2017.   Specialties:  General Surgery, Radiology Why:  Thursday at 11:15am for hospital follow-up Contact information: [address] [name] [address] [phone]           Signed: Robert Bellow 09/13/2017, 12:55 PM

## 2017-09-15 ENCOUNTER — Encounter: Payer: Self-pay | Admitting: Internal Medicine

## 2017-09-15 ENCOUNTER — Ambulatory Visit (INDEPENDENT_AMBULATORY_CARE_PROVIDER_SITE_OTHER): Payer: Medicare Other | Admitting: Internal Medicine

## 2017-09-15 VITALS — BP 124/82 | HR 78 | Temp 98.4°F | Wt 201.0 lb

## 2017-09-15 DIAGNOSIS — J069 Acute upper respiratory infection, unspecified: Secondary | ICD-10-CM | POA: Diagnosis not present

## 2017-09-15 DIAGNOSIS — B9789 Other viral agents as the cause of diseases classified elsewhere: Secondary | ICD-10-CM | POA: Diagnosis not present

## 2017-09-15 DIAGNOSIS — I70212 Atherosclerosis of native arteries of extremities with intermittent claudication, left leg: Secondary | ICD-10-CM

## 2017-09-15 MED ORDER — HYDROCOD POLST-CPM POLST ER 10-8 MG/5ML PO SUER: 5.0000 mL | Freq: Every evening | ORAL | 0 refills | Status: DC | PRN

## 2017-09-15 NOTE — Patient Instructions (Signed)
Upper Respiratory Infection, Adult Most upper respiratory infections (URIs) are caused by a virus. A URI affects the nose, throat, and upper air passages. The most common type of URI is often called "the common cold." Follow these instructions at home:  Take medicines only as told by your doctor.  Gargle warm saltwater or take cough drops to comfort your throat as told by your doctor.  Use a warm mist humidifier or inhale steam from a shower to increase air moisture. This may make it easier to breathe.  Drink enough fluid to keep your pee (urine) clear or pale yellow.  Eat soups and other clear broths.  Have a healthy diet.  Rest as needed.  Go back to work when your fever is gone or your doctor says it is okay. ? You may need to stay home longer to avoid giving your URI to others. ? You can also wear a face mask and wash your hands often to prevent spread of the virus.  Use your inhaler more if you have asthma.  Do not use any tobacco products, including cigarettes, chewing tobacco, or electronic cigarettes. If you need help quitting, ask your doctor. Contact a doctor if:  You are getting worse, not better.  Your symptoms are not helped by medicine.  You have chills.  You are getting more short of breath.  You have brown or red mucus.  You have yellow or brown discharge from your nose.  You have pain in your face, especially when you bend forward.  You have a fever.  You have puffy (swollen) neck glands.  You have pain while swallowing.  You have white areas in the back of your throat. Get help right away if:  You have very bad or constant: ? Headache. ? Ear pain. ? Pain in your forehead, behind your eyes, and over your cheekbones (sinus pain). ? Chest pain.  You have long-lasting (chronic) lung disease and any of the following: ? Wheezing. ? Long-lasting cough. ? Coughing up blood. ? A change in your usual mucus.  You have a stiff neck.  You have  changes in your: ? Vision. ? Hearing. ? Thinking. ? Mood. This information is not intended to replace advice given to you by your health care provider. Make sure you discuss any questions you have with your health care provider. Document Released: 04/19/2008 Document Revised: 07/04/2016 Document Reviewed: 02/06/2014 Elsevier Interactive Patient Education  2018 Elsevier Inc.  

## 2017-09-15 NOTE — Progress Notes (Signed)
HPI  Pt presents to the clinic today with c/o runny nose, sore throat and cough. This started 3-4 days ago. She is blowing clear mucous out of her nose. The cough is nonproductive. She denies fever or chills but had been fatigued.  She has a history of COPD, managed on Symbicort. She has not had sick contacts but was recently in the hospital for a ventral hernia repair.   Review of Systems        Past Medical History:  Diagnosis Date  . Backache, unspecified    s/p c-spine and l-spine fusions  . Degeneration of intervertebral disc, site unspecified   . Depressive disorder, not elsewhere classified   . Disorder of bone and cartilage, unspecified   . HTN (hypertension)   . Hx of left heart catheterization by cutdown    a. 7 years ago; no significant cad; b. Lexiscan 2014: no significant ischemia, no significant EKG changes concerning for ischemia, EF 67%, overall low risk study  . Kidney failure    Dr Cyndia Diver  . Myalgia and myositis, unspecified   . Obesity, unspecified   . Personal history of tobacco use, presenting hazards to health    1/2 ppd  . Plantar fascial fibromatosis   . S/P cholecystectomy   . S/p nephrectomy   . Type II or unspecified type diabetes mellitus without mention of complication, not stated as uncontrolled   . Unspecified essential hypertension   . Unspecified hereditary and idiopathic peripheral neuropathy    secondary to diabetes    Family History  Problem Relation Age of Onset  . Hypertension Mother   . Colon cancer Mother   . Cancer Mother        colon  . Coronary artery disease Mother   . Diabetes Mother   . Prostate cancer Father   . COPD Father   . Cancer Father        prostate  . Heart attack Brother 63  . Sleep apnea Brother   . Diabetes Paternal Aunt   . Diabetes Paternal Uncle   . Cancer Daughter        Brain, lungs, liver, spine, kidney    Social History   Social History  . Marital status: Married    Spouse name: N/A  . Number  of children: 4  . Years of education: N/A   Occupational History  . Retired    Social History Main Topics  . Smoking status: Current Every Day Smoker    Packs/day: 0.25    Years: 30.00    Types: Cigarettes  . Smokeless tobacco: Never Used     Comment: currently smoking .25 ppd, trying to quit 02/13/16  . Alcohol use No  . Drug use: No  . Sexual activity: No   Other Topics Concern  . Not on file   Social History Narrative   Lives in [place]. Disability because of back    Allergies  Allergen Reactions  . Morphine Anaphylaxis  . Prednisone Other (See Comments)    "makes me think I am having a heart attack"  . Gabapentin Other (See Comments)    intolerance  . Nsaids Diarrhea  . Penicillins Swelling    Has patient had a PCN reaction causing immediate rash, facial/tongue/throat swelling, SOB or lightheadedness with hypotension: Yes Has patient had a PCN reaction causing severe rash involving mucus membranes or skin necrosis: No Has patient had a PCN reaction that required hospitalization: No Has patient had a PCN reaction occurring within the last  10 years: No If all of the above answers are "NO", then may proceed with Cephalosporin use.      Constitutional: Positive fatigue. Denies headache, fever or abrupt weight changes.  HEENT:  Positive runny nose, sore throat. Denies eye redness, eye pain, pressure behind the eyes, facial pain, nasal congestion, ear pain, ringing in the ears, wax buildup, or bloody nose. Respiratory: Positive cough. Denies difficulty breathing or shortness of breath.  Cardiovascular: Denies chest pain, chest tightness, palpitations or swelling in the hands or feet.   No other specific complaints in a complete review of systems (except as listed in HPI above).  Objective:   BP 124/82   Pulse 78   Temp 98.4 F (36.9 C) (Oral)   Wt 201 lb (91.2 kg)   SpO2 95%   BMI 37.98 kg/m  Wt Readings from Last 3 Encounters:  09/15/17 201 lb (91.2 kg)   09/08/17 200 lb (90.7 kg)  09/07/17 202 lb (91.6 kg)    General: Appears her stated age, in NAD. HEENT: Head: normal shape and size, no sinus tenderness;  Ears: Tm's gray and intact, normal light reflex; Nose: mucosa pink and moist, septum midline; Throat/Mouth: + PND. Teeth present, mucosa erythematous and moist, no exudate noted, no lesions or ulcerations noted.  Neck: No cervical lymphadenopathy.   Pulmonary/Chest: Normal effort and positive vesicular breath sounds. No respiratory distress. No wheezes, rales or ronchi noted.       Assessment & Plan:   Viral Upper Respiratory Infection with Cough:  Get some rest and drink plenty of water Do salt water gargles for the sore throat Start Mucinex 600 mg Q12 hours Rx for Tussionex cough syrup  RTC as needed or if symptoms persist.   Webb Silversmith, NP

## 2017-09-16 ENCOUNTER — Other Ambulatory Visit: Payer: Self-pay

## 2017-09-16 NOTE — Patient Outreach (Signed)
[facility]) Care Management  09/16/2017  Andrea Stewart May 16, 1946 [id number]   EMMI: General discharge Referral date: 09/16/17 Referral source: EMMI general discharged red alert Referral reason: Lost interest in things: YES Day # 4  Telephone call to patient regarding EMMI general discharge red alert. Unable to reach patient or leave voice message. Message states enter remote access code.   PLAN: RNCM will attempt to reach patient with in 5 business days.   Quinn Plowman RN,BSN,CCM [facility] Telephonic  [phone]

## 2017-09-20 ENCOUNTER — Other Ambulatory Visit: Payer: Self-pay

## 2017-09-21 ENCOUNTER — Other Ambulatory Visit: Payer: Self-pay | Admitting: Internal Medicine

## 2017-09-21 DIAGNOSIS — R809 Proteinuria, unspecified: Secondary | ICD-10-CM | POA: Diagnosis not present

## 2017-09-21 DIAGNOSIS — I129 Hypertensive chronic kidney disease with stage 1 through stage 4 chronic kidney disease, or unspecified chronic kidney disease: Secondary | ICD-10-CM | POA: Diagnosis not present

## 2017-09-21 DIAGNOSIS — N184 Chronic kidney disease, stage 4 (severe): Secondary | ICD-10-CM | POA: Diagnosis not present

## 2017-09-21 DIAGNOSIS — E1122 Type 2 diabetes mellitus with diabetic chronic kidney disease: Secondary | ICD-10-CM | POA: Diagnosis not present

## 2017-09-21 LAB — BASIC METABOLIC PANEL
BUN: 25 — AB (ref 4–21)
Creatinine: 2 — AB (ref 0.5–1.1)
GLUCOSE: 214
Potassium: 4.4 (ref 3.4–5.3)
Sodium: 141 (ref 137–147)

## 2017-09-21 NOTE — Patient Outreach (Signed)
[facility]) Care Management  09/21/2017  Andrea Stewart [birth year] [id number]  EMMI: General discharge Referral date: 09/16/17 Referral source: EMMI general discharged red alert Referral reason: Lost interest in things: YES Day # 4  Telephone call to patient regarding EMMI general discharge red alert. Unable to reach patient or leave voice message. Message states enter remote access code.   PLAN: RNCM will attempt 3rd telephone outreach within 5 business days.   Quinn Plowman RN,BSN,CCM [facility] Telephonic  [phone]

## 2017-09-22 ENCOUNTER — Ambulatory Visit (INDEPENDENT_AMBULATORY_CARE_PROVIDER_SITE_OTHER): Payer: Medicare Other | Admitting: General Surgery

## 2017-09-22 ENCOUNTER — Encounter: Payer: Self-pay | Admitting: General Surgery

## 2017-09-22 ENCOUNTER — Other Ambulatory Visit: Payer: Self-pay

## 2017-09-22 VITALS — BP 134/70 | HR 69 | Resp 16 | Ht 60.0 in | Wt 200.0 lb

## 2017-09-22 DIAGNOSIS — K436 Other and unspecified ventral hernia with obstruction, without gangrene: Secondary | ICD-10-CM

## 2017-09-22 NOTE — Patient Outreach (Signed)
[facility]) Care Management  09/22/2017  SHOSHANNA MCQUITTY 1946-11-13 [id number]   EMMI:General discharge Referral date:09/16/17 Referral source:EMMI general discharged red alert Referral reason:Lost interest in things: YES Day #4  Telephone call to patient regarding EMMI general discharge red alert. HIPAA verified. Discussed EMMI general discharge with patient. Patient states she has been doing fine since her surgery.  Patient states she has not lost interest in things. Patient reports her main issue was when she was in the hospital. Patient expressed frustration.  Patient states, " I was not given my regular insulin while I was in the hospital. I was only given 15 units of fast acting insulin." " My blood sugars were running between 300 - 500." Patient states, " I asked why I was only getting 15 units of fast acting insulin which was not controlling my blood sugars."  Patient states she normally takes Humulin N in the morning and night and she is on a sliding scale of regular.  Patient states her blood sugars have returned to a more normal range from her since she has been back home. Patient states her fasting blood sugar this morning was 122.  Patient states she has an appointment with her kidney doctor recently and he discontinued her metformin due to her lab work for her kidney's. Patient stats her doctor will have to do additional adjusting with her insulin. Patient states her endocrinologist, Dr. Criss Rosales manages her diabetes very well. RNCM advised patient to notify MD of any changes in condition prior to scheduled appointment. RNCM verified patient aware of 911 services for urgent/ emergent needs. RNCM discussed and offered [facility] care management services. Patient refused. Patient states she does not need these services. Patient verbally agreed to receive [facility] brochure for future reference.   ASSESSMENT: status post hernia surgery  PLAN; RNCM will refer patient to care  management assistant to close patient due to patient refusing services.  RNCM will send patient [facility] care management brochure as discussed.   Quinn Plowman RN,BSN,CCM [facility] Telephonic  [phone]

## 2017-09-22 NOTE — Patient Instructions (Signed)
Patient to return as needed. The patient is aware to call back for any questions or concerns. 

## 2017-09-22 NOTE — Progress Notes (Signed)
Patient ID: Andrea Stewart, female   DOB: 1946/09/07, 71 y.o.   MRN: [id number]  Chief Complaint  Patient presents with  . Routine Post Op    HPI Andrea Stewart is a 71 y.o. female here today for her post op ventral hernia repair done on 09/07/2017. Patient states she is doing well. HPI  Past Medical History:  Diagnosis Date  . Backache, unspecified    s/p c-spine and l-spine fusions  . Degeneration of intervertebral disc, site unspecified   . Depressive disorder, not elsewhere classified   . Disorder of bone and cartilage, unspecified   . HTN (hypertension)   . Hx of left heart catheterization by cutdown    a. 7 years ago; no significant cad; b. Lexiscan 2014: no significant ischemia, no significant EKG changes concerning for ischemia, EF 67%, overall low risk study  . Kidney failure    Dr Cyndia Diver  . Myalgia and myositis, unspecified   . Obesity, unspecified   . Personal history of tobacco use, presenting hazards to health    1/2 ppd  . Plantar fascial fibromatosis   . S/P cholecystectomy   . S/p nephrectomy   . Type II or unspecified type diabetes mellitus without mention of complication, not stated as uncontrolled   . Unspecified essential hypertension   . Unspecified hereditary and idiopathic peripheral neuropathy    secondary to diabetes    Past Surgical History:  Procedure Laterality Date  . ABDOMINAL HYSTERECTOMY    . APPENDECTOMY    . BACK SURGERY     2006  . CARDIAC CATHETERIZATION     o.k. 2003  . CARPAL TUNNEL RELEASE    . CERVICAL DISCECTOMY    . CHOLECYSTECTOMY    . COLONOSCOPY  09/1999   colonoscopy-hemorrhoids, re-check 5 years (10/2006)  . dexa     osteopenia (01/2004)  . ELBOW SURGERY    . FOOT SURGERY     x 3  . KIDNEY DONATION  1991  . LUMBAR FUSION    . NECK SURGERY     06/2005  . Problem with general Anesthesia     02/2006  . REPLACEMENT TOTAL KNEE  10/2004  . trigger finger surgery    . VESICOVAGINAL FISTULA CLOSURE W/ TAH       Family History  Problem Relation Age of Onset  . Hypertension Mother   . Colon cancer Mother   . Cancer Mother        colon  . Coronary artery disease Mother   . Diabetes Mother   . Prostate cancer Father   . COPD Father   . Cancer Father        prostate  . Heart attack Brother 59  . Sleep apnea Brother   . Diabetes Paternal Aunt   . Diabetes Paternal Uncle   . Cancer Daughter        Brain, lungs, liver, spine, kidney    Social History Social History   Tobacco Use  . Smoking status: Current Every Day Smoker    Packs/day: 0.25    Years: 30.00    Pack years: 7.50    Types: Cigarettes  . Smokeless tobacco: Never Used  . Tobacco comment: currently smoking .25 ppd, trying to quit 02/13/16  Substance Use Topics  . Alcohol use: No    Alcohol/week: 0.0 oz  . Drug use: No    Allergies  Allergen Reactions  . Morphine Anaphylaxis  . Prednisone Other (See Comments)    "makes me think I  am having a heart attack"  . Gabapentin Other (See Comments)    intolerance  . Nsaids Diarrhea  . Penicillins Swelling    Has patient had a PCN reaction causing immediate rash, facial/tongue/throat swelling, SOB or lightheadedness with hypotension: Yes Has patient had a PCN reaction causing severe rash involving mucus membranes or skin necrosis: No Has patient had a PCN reaction that required hospitalization: No Has patient had a PCN reaction occurring within the last 10 years: No If all of the above answers are "NO", then may proceed with Cephalosporin use.     Current Outpatient Medications  Medication Sig Dispense Refill  . albuterol (PROVENTIL HFA) 108 (90 BASE) MCG/ACT inhaler Inhale 2 puffs into the lungs every 4 (four) hours as needed for wheezing or shortness of breath. 1 Inhaler 2  . amLODipine (NORVASC) 10 MG tablet TAKE 1 TABLET BY MOUTH EVERY DAY. 90 tablet 1  . aspirin 81 MG tablet Take 1 tablet (81 mg total) by mouth daily.    Marland Kitchen atorvastatin (LIPITOR) 20 MG tablet Take  1 tablet (20 mg total) by mouth daily. 90 tablet 0  . bisacodyl (DULCOLAX) 10 MG suppository Place 1 suppository (10 mg total) rectally as needed for moderate constipation. 12 suppository 0  . budesonide-formoterol (SYMBICORT) 160-4.5 MCG/ACT inhaler Inhale 2 puffs into the lungs 2 (two) times daily. 1 Inhaler 5  . calcium carbonate (OS-CAL) 600 MG TABS Take 600 mg by mouth daily.      . carvedilol (COREG) 6.25 MG tablet Take 1 tablet (6.25 mg total) by mouth 2 (two) times daily with a meal. (Patient taking differently: Take 6.25 mg by mouth daily. ) 60 tablet 3  . chlorpheniramine-HYDROcodone (TUSSIONEX PENNKINETIC ER) 10-8 MG/5ML SUER Take 5 mLs by mouth at bedtime as needed. 140 mL 0  . co-enzyme Q-10 30 MG capsule Take 30 mg by mouth daily.    Marland Kitchen DAILY MULTIPLE VITAMINS PO Take 1 tablet by mouth daily.      . fluticasone (VERAMYST) 27.5 MCG/SPRAY nasal spray Place 2 sprays into the nose as needed for rhinitis or allergies.     Marland Kitchen insulin lispro (HUMALOG) 100 UNIT/ML injection Inject into the skin as directed. Sliding scale 100-150 = 2-2.5 units 150-200 = 3-3.5 units 200-250 = 4-4.5 units 251-300 = 5-5.5 units 301-350 = 6-6.5 units 350-400 = 7-7.5 units    . insulin NPH (HUMULIN N,NOVOLIN N) 100 UNIT/ML injection Inject 22 Units into the skin daily before breakfast. 24 units at bedtime    . irbesartan (AVAPRO) 150 MG tablet Take 150 mg by mouth 2 (two) times daily.     . metFORMIN (GLUCOPHAGE-XR) 500 MG 24 hr tablet Take 500 mg by mouth 2 (two) times daily.     . metolazone (ZAROXOLYN) 2.5 MG tablet Take 1 tablet (2.5 mg total) by mouth daily. 30 mintues prior to torsemide 30 tablet 2  . mometasone (NASONEX) 50 MCG/ACT nasal spray Place 2 sprays into the nose daily as needed.     . nitroGLYCERIN (NITROSTAT) 0.4 MG SL tablet Place 1 tablet (0.4 mg total) under the tongue every 5 (five) minutes as needed. Maximum 3 tablets (Patient taking differently: Place 0.4 mg under the tongue every 5 (five)  minutes as needed for chest pain. Maximum 3 tablets) 25 tablet 6  . sodium chloride (OCEAN) 0.65 % SOLN nasal spray Place 2 sprays into both nostrils as needed for congestion.    Marland Kitchen Spacer/Aero-Holding Chambers (AEROCHAMBER MV) inhaler Use as instructed 1 each 0  .  torsemide (DEMADEX) 5 MG tablet Take 1 tablet (5 mg total) by mouth daily. 30 tablet 2   Current Facility-Administered Medications  Medication Dose Route Frequency Provider Last Rate Last Dose  . albuterol (PROVENTIL HFA;VENTOLIN HFA) 108 (90 Base) MCG/ACT inhaler 2 puff  2 puff Inhalation Q4H PRN Lelynd Poer, Forest Gleason, MD      . lactated ringers infusion   Intravenous Continuous Bary Castilla, Forest Gleason, MD      . ondansetron [facility]) 4 mg in sodium chloride 0.9 % 50 mL IVPB  4 mg Intravenous Q4H PRN Robert Bellow, MD      . traMADol Veatrice Bourbon) tablet 50 mg  50 mg Oral Q4H PRN Robert Bellow, MD        Review of Systems Review of Systems  Blood pressure 134/70, pulse 69, resp. rate 16, height 5' (1.524 m), weight 200 lb (90.7 kg), SpO2 91 %.  Physical Exam Physical Exam  Constitutional: She is oriented to person, place, and time. She appears well-developed and well-nourished.  Cardiovascular: Normal rate, regular rhythm and normal heart sounds.  Pulmonary/Chest: Effort normal and breath sounds normal.  Abdominal:    Neurological: She is alert and oriented to person, place, and time.  Skin: Skin is warm and dry.       Assessment    Doing well post ventral hernia repair.    Plan         Patient to return as needed. The patient is aware to call back for any questions or concerns.   HPI, Physical Exam, Assessment and Plan have been scribed under the direction and in the presence of Hervey Ard, MD.  Gaspar Cola, CMA  I have completed the exam and reviewed the above documentation for accuracy and completeness.  I agree with the above.  Haematologist has been used and any errors in dictation or  transcription are unintentional.  Hervey Ard, M.D., F.A.C.S.  Robert Bellow 09/22/2017, 8:10 PM

## 2017-09-23 ENCOUNTER — Telehealth: Payer: Self-pay

## 2017-09-23 ENCOUNTER — Ambulatory Visit: Payer: Self-pay | Admitting: *Deleted

## 2017-09-23 NOTE — Telephone Encounter (Signed)
Called patient with concern over waiting for Monday eval with Tor Netters, NP.  Patient reports increase in chest tightness and congestion with wheezing and states that she has been using her inhalers much more than usual.  Given patient's history, I encouraged her to be seen sooner over the weekend and offered her an appointment at our [facility] office for Saturday acute care.  Patient refused stating that she cannot drive or travel to [place].  She would rather keep appointment for Monday than travel to [place].  I informed her that if she has increase in symptoms and/or worsening shortness of breath then she should go to a local urgent care or ER for more immediate attention through the weekend.  She agrees and thanks me for our call.

## 2017-09-23 NOTE — Telephone Encounter (Signed)
Post hernia surgery 2 weeks ago, saw surgeon yesterday for follow-up and she was wheezing and has been for one week.  Reason for Disposition . [1] MODERATE weakness (i.e., interferes with work, school, normal activities) AND [2] persists > 3 days  Answer Assessment - Initial Assessment Questions 1. DESCRIPTION: "Describe how you are feeling."     Tired wheezing 2. SEVERITY: "How bad is it?"  "Can you stand and walk?"   - MILD - Feels weak or tired, but does not interfere with work, school or normal activities   - Sneads to stand and walk; weakness interferes with work, school, or normal activities   - SEVERE - Unable to stand or walk    mild 3. ONSET:  "When did the weakness begin?"    One week ago 4. CAUSE: "What do you think is causing the weakness?"    Post surgery 5. MEDICINES: "Have you recently started a new medicine or had a change in the amount of a medicine?"    no 6. OTHER SYMPTOMS: "Do you have any other symptoms?" (e.g., chest pain, fever, cough, SOB, vomiting, diarrhea, bleeding)     Feels sob with walking 7. PREGNANCY: "Is there any chance you are pregnant?" "When was your last menstrual period?"     na  Protocols used: WEAKNESS (GENERALIZED) AND FATIGUE-A-AH

## 2017-09-26 ENCOUNTER — Ambulatory Visit (INDEPENDENT_AMBULATORY_CARE_PROVIDER_SITE_OTHER): Payer: Medicare Other | Admitting: Family Medicine

## 2017-09-26 ENCOUNTER — Ambulatory Visit: Payer: Medicare Other | Admitting: Family Medicine

## 2017-09-26 ENCOUNTER — Encounter: Payer: Self-pay | Admitting: Family Medicine

## 2017-09-26 VITALS — BP 158/82 | HR 92 | Temp 98.0°F | Wt 199.2 lb

## 2017-09-26 DIAGNOSIS — I70212 Atherosclerosis of native arteries of extremities with intermittent claudication, left leg: Secondary | ICD-10-CM | POA: Diagnosis not present

## 2017-09-26 DIAGNOSIS — J069 Acute upper respiratory infection, unspecified: Secondary | ICD-10-CM | POA: Diagnosis not present

## 2017-09-26 DIAGNOSIS — B9789 Other viral agents as the cause of diseases classified elsewhere: Secondary | ICD-10-CM

## 2017-09-26 MED ORDER — ALBUTEROL SULFATE HFA 108 (90 BASE) MCG/ACT IN AERS: 2.0000 | INHALATION_SPRAY | RESPIRATORY_TRACT | 2 refills | Status: DC | PRN

## 2017-09-26 NOTE — Patient Instructions (Signed)
Good to see you today  Your lungs sound good  Continue to hydrate well  If you develop more symptoms, please follow up

## 2017-09-26 NOTE — Progress Notes (Signed)
Subjective:    Patient ID: Andrea Stewart, female    DOB: Apr 08, 1946, 71 y.o.   MRN: [id number]  HPI This is a 71 yo female who presents today with wheezing. Was seen at her hernia post op appointment 4 days ago and was told she was wheezing and oxygen level low at 91%. She has not heard any wheezing. Was seen last week with viral URI. No fever, some sore throat after surgery. Her energy is improving. Sleeping ok. Little cough, no sputum production. No increased SOB above baseline. Out of albuterol inhaler. Taking symbicort daily (orders from [place]).  Smokes 5-6 cigarettes a day. Has cut back, is trying to quit.   Past Medical History:  Diagnosis Date  . Backache, unspecified    s/p c-spine and l-spine fusions  . Degeneration of intervertebral disc, site unspecified   . Depressive disorder, not elsewhere classified   . Disorder of bone and cartilage, unspecified   . HTN (hypertension)   . Hx of left heart catheterization by cutdown    a. 7 years ago; no significant cad; b. Lexiscan 2014: no significant ischemia, no significant EKG changes concerning for ischemia, EF 67%, overall low risk study  . Kidney failure    Dr Cyndia Diver  . Myalgia and myositis, unspecified   . Obesity, unspecified   . Personal history of tobacco use, presenting hazards to health    1/2 ppd  . Plantar fascial fibromatosis   . S/P cholecystectomy   . S/p nephrectomy   . Type II or unspecified type diabetes mellitus without mention of complication, not stated as uncontrolled   . Unspecified essential hypertension   . Unspecified hereditary and idiopathic peripheral neuropathy    secondary to diabetes   Past Surgical History:  Procedure Laterality Date  . ABDOMINAL HYSTERECTOMY    . APPENDECTOMY    . BACK SURGERY     2006  . CARDIAC CATHETERIZATION     o.k. 2003  . CARPAL TUNNEL RELEASE    . CERVICAL DISCECTOMY    . CHOLECYSTECTOMY    . COLONOSCOPY  09/1999   colonoscopy-hemorrhoids, re-check 5  years (10/2006)  . dexa     osteopenia (01/2004)  . ELBOW SURGERY    . FOOT SURGERY     x 3  . KIDNEY DONATION  1991  . LUMBAR FUSION    . NECK SURGERY     06/2005  . Problem with general Anesthesia     02/2006  . REPLACEMENT TOTAL KNEE  10/2004  . trigger finger surgery    . VESICOVAGINAL FISTULA CLOSURE W/ TAH     Family History  Problem Relation Age of Onset  . Hypertension Mother   . Colon cancer Mother   . Cancer Mother        colon  . Coronary artery disease Mother   . Diabetes Mother   . Prostate cancer Father   . COPD Father   . Cancer Father        prostate  . Heart attack Brother 72  . Sleep apnea Brother   . Diabetes Paternal Aunt   . Diabetes Paternal Uncle   . Cancer Daughter        Brain, lungs, liver, spine, kidney   Social History   Tobacco Use  . Smoking status: Current Every Day Smoker    Packs/day: 0.25    Years: 30.00    Pack years: 7.50    Types: Cigarettes  . Smokeless tobacco: Never Used  .  Tobacco comment: currently smoking .25 ppd, trying to quit 02/13/16  Substance Use Topics  . Alcohol use: No    Alcohol/week: 0.0 oz  . Drug use: No      Review of Systems Per HPI    Objective:   Physical Exam  Constitutional: She is oriented to person, place, and time. She appears well-developed and well-nourished. No distress.  HENT:  Head: Normocephalic and atraumatic.  Nose: Nose normal.  Mouth/Throat: Mucous membranes are normal. Posterior oropharyngeal erythema (mild) present.  Eyes: Conjunctivae are normal.  Neck: Normal range of motion. Neck supple.  Cardiovascular: Normal rate, regular rhythm and normal heart sounds.  Pulmonary/Chest: Effort normal and breath sounds normal. No respiratory distress. She has no wheezes. She has no rales.  Lymphadenopathy:    She has no cervical adenopathy.  Neurological: She is alert and oriented to person, place, and time.  Skin: Skin is warm and dry. She is not diaphoretic.  Psychiatric: She has  a normal mood and affect. Her behavior is normal. Judgment and thought content normal.  Vitals reviewed.     BP (!) 158/82 (BP Location: Left Arm, Patient Position: Sitting, Cuff Size: Large)   Pulse 92   Temp 98 F (36.7 C) (Oral)   Wt 199 lb 4 oz (90.4 kg)   SpO2 94%   BMI 38.91 kg/m  Wt Readings from Last 3 Encounters:  09/26/17 199 lb 4 oz (90.4 kg)  09/22/17 200 lb (90.7 kg)  09/15/17 201 lb (91.2 kg)       Assessment & Plan:  1. Viral URI with cough - resolving, lungs clear today - encouraged adequate hydration, smoking cessation - RTC precautions reviewed - printed prescription provided for albuterol inhaler  Clarene Reamer, FNP-BC  [facility] [place]  09/27/2017 8:06 AM

## 2017-09-27 ENCOUNTER — Encounter: Payer: Self-pay | Admitting: Family Medicine

## 2017-10-04 ENCOUNTER — Encounter: Payer: Self-pay | Admitting: Internal Medicine

## 2017-10-04 ENCOUNTER — Ambulatory Visit (INDEPENDENT_AMBULATORY_CARE_PROVIDER_SITE_OTHER): Payer: Medicare Other | Admitting: Internal Medicine

## 2017-10-04 DIAGNOSIS — N183 Chronic kidney disease, stage 3 unspecified: Secondary | ICD-10-CM

## 2017-10-04 DIAGNOSIS — I70212 Atherosclerosis of native arteries of extremities with intermittent claudication, left leg: Secondary | ICD-10-CM

## 2017-10-04 DIAGNOSIS — Z716 Tobacco abuse counseling: Secondary | ICD-10-CM

## 2017-10-04 DIAGNOSIS — J449 Chronic obstructive pulmonary disease, unspecified: Secondary | ICD-10-CM | POA: Diagnosis not present

## 2017-10-04 NOTE — Progress Notes (Signed)
Subjective:  Patient ID: Andrea Stewart, female    DOB: 1946/09/07  Age: 71 y.o. MRN: [id number]  CC: Diagnoses of COPD (chronic obstructive pulmonary disease) with chronic bronchitis ([place]), Tobacco abuse counseling, Morbid obesity ([place]), and CKD (chronic kidney disease), stage III ([place]) were pertinent to this visit.  HPI Andrea Stewart presents for follow up on hypertension,  Hyperlipidemia and COPD   Treated for resolving Viral URI with cough by NP on Nov 12 that started after her hospitalization in October for urgent surgery for incarcerated omentum on October 24th   She has not been able to reduce her daily use of  cigarettes to less than 5 daily . Wants to quit but afraid of gaining more weight.   Doesn't exercise due to right knee pain due to DJD.   A total of 25 minutes of face to face time was spent with patient more than half of which was spent in counselling about the above mentioned conditions  and coordination of care    Outpatient Medications Prior to Visit  Medication Sig Dispense Refill  . albuterol (PROVENTIL HFA) 108 (90 Base) MCG/ACT inhaler Inhale 2 puffs every 4 (four) hours as needed into the lungs for wheezing or shortness of breath. 1 Inhaler 2  . amLODipine (NORVASC) 10 MG tablet TAKE 1 TABLET BY MOUTH EVERY DAY. 90 tablet 1  . aspirin 81 MG tablet Take 1 tablet (81 mg total) by mouth daily.    Marland Kitchen atorvastatin (LIPITOR) 20 MG tablet TAKE 1 TABLET BY MOUTH EVERY DAY 90 tablet 0  . bisacodyl (DULCOLAX) 10 MG suppository Place 1 suppository (10 mg total) rectally as needed for moderate constipation. 12 suppository 0  . budesonide-formoterol (SYMBICORT) 160-4.5 MCG/ACT inhaler Inhale 2 puffs into the lungs 2 (two) times daily. 1 Inhaler 5  . calcium carbonate (OS-CAL) 600 MG TABS Take 600 mg by mouth daily.      . carvedilol (COREG) 6.25 MG tablet Take 1 tablet (6.25 mg total) by mouth 2 (two) times daily with a meal. (Patient taking differently: Take 6.25 mg by  mouth daily. ) 60 tablet 3  . chlorpheniramine-HYDROcodone (TUSSIONEX PENNKINETIC ER) 10-8 MG/5ML SUER Take 5 mLs by mouth at bedtime as needed. 140 mL 0  . co-enzyme Q-10 30 MG capsule Take 30 mg by mouth daily.    Marland Kitchen DAILY MULTIPLE VITAMINS PO Take 1 tablet by mouth daily.      . fluticasone (VERAMYST) 27.5 MCG/SPRAY nasal spray Place 2 sprays into the nose as needed for rhinitis or allergies.     Marland Kitchen insulin lispro (HUMALOG) 100 UNIT/ML injection Inject into the skin as directed. Sliding scale 100-150 = 2-2.5 units 150-200 = 3-3.5 units 200-250 = 4-4.5 units 251-300 = 5-5.5 units 301-350 = 6-6.5 units 350-400 = 7-7.5 units    . insulin NPH (HUMULIN N,NOVOLIN N) 100 UNIT/ML injection Inject 22 Units into the skin daily before breakfast. 24 units at bedtime    . irbesartan (AVAPRO) 150 MG tablet Take 150 mg by mouth 2 (two) times daily.     . metolazone (ZAROXOLYN) 2.5 MG tablet TAKE 1 TABLET BY MOUTH DAILY 30 MINUTES PRIOR TO TORSEMIDE 30 tablet 2  . mometasone (NASONEX) 50 MCG/ACT nasal spray Place 2 sprays into the nose daily as needed.     . nitroGLYCERIN (NITROSTAT) 0.4 MG SL tablet Place 1 tablet (0.4 mg total) under the tongue every 5 (five) minutes as needed. Maximum 3 tablets (Patient taking differently: Place 0.4 mg under  the tongue every 5 (five) minutes as needed for chest pain. Maximum 3 tablets) 25 tablet 6  . sodium chloride (OCEAN) 0.65 % SOLN nasal spray Place 2 sprays into both nostrils as needed for congestion.    Marland Kitchen Spacer/Aero-Holding Chambers (AEROCHAMBER MV) inhaler Use as instructed 1 each 0  . torsemide (DEMADEX) 10 MG tablet TAKE ONE-HALF TABLET BY MOUTH DAILY 15 tablet 0   Facility-Administered Medications Prior to Visit  Medication Dose Route Frequency Provider Last Rate Last Dose  . albuterol (PROVENTIL HFA;VENTOLIN HFA) 108 (90 Base) MCG/ACT inhaler 2 puff  2 puff Inhalation Q4H PRN Byrnett, Forest Gleason, MD      . lactated ringers infusion   Intravenous Continuous  Bary Castilla, Forest Gleason, MD      . ondansetron [facility]) 4 mg in sodium chloride 0.9 % 50 mL IVPB  4 mg Intravenous Q4H PRN Robert Bellow, MD      . traMADol Veatrice Bourbon) tablet 50 mg  50 mg Oral Q4H PRN Robert Bellow, MD        Review of Systems;  Patient denies headache, fevers, malaise, unintentional weight loss, skin rash, eye pain, sinus congestion and sinus pain, sore throat, dysphagia,  hemoptysis , cough, dyspnea, wheezing, chest pain, palpitations, orthopnea, edema, abdominal pain, nausea, melena, diarrhea, constipation, flank pain, dysuria, hematuria, urinary  Frequency, nocturia, numbness, tingling, seizures,  Focal weakness, Loss of consciousness,  Tremor, insomnia, depression, anxiety, and suicidal ideation.      Objective:  BP 134/80 (BP Location: Left Arm, Patient Position: Sitting, Cuff Size: Large)   Pulse 73   Temp 98.4 F (36.9 C) (Oral)   Resp 16   Ht 5' (1.524 m)   Wt 199 lb (90.3 kg)   SpO2 95%   BMI 38.86 kg/m   BP Readings from Last 3 Encounters:  10/04/17 134/80  09/26/17 (!) 158/82  09/22/17 134/70    Wt Readings from Last 3 Encounters:  10/04/17 199 lb (90.3 kg)  09/26/17 199 lb 4 oz (90.4 kg)  09/22/17 200 lb (90.7 kg)    General appearance: alert, cooperative and appears stated age Ears: normal TM's and external ear canals both ears Throat: lips, mucosa, and tongue normal; teeth and gums normal Neck: no adenopathy, no carotid bruit, supple, symmetrical, trachea midline and thyroid not enlarged, symmetric, no tenderness/mass/nodules Back: symmetric, no curvature. ROM normal. No CVA tenderness. Lungs: clear to auscultation bilaterally Heart: regular rate and rhythm, S1, S2 normal, no murmur, click, rub or gallop Abdomen: soft, non-tender; bowel sounds normal; no masses,  no organomegaly Pulses: 2+ and symmetric Skin: Skin color, texture, turgor normal. No rashes or lesions Lymph nodes: Cervical, supraclavicular, and axillary nodes normal.  Lab  Results  Component Value Date   HGBA1C 9.4 (H) 09/07/2017   HGBA1C 9.7 07/25/2017   HGBA1C 8.9 (A) 01/01/2014    Lab Results  Component Value Date   CREATININE 1.88 (H) 09/08/2017   CREATININE 2.37 (H) 09/07/2017   CREATININE 1.65 (H) 07/22/2017    Lab Results  Component Value Date   WBC 8.2 09/07/2017   HGB 12.8 09/07/2017   HCT 37.8 09/07/2017   PLT 238 09/07/2017   GLUCOSE 141 (H) 09/08/2017   CHOL 136 08/25/2017   TRIG 111.0 08/25/2017   HDL 60.30 08/25/2017   LDLDIRECT 51.9 09/07/2012   LDLCALC 53 08/25/2017   ALT 16 07/22/2017   AST 18 07/22/2017   NA 140 09/08/2017   K 3.7 09/08/2017   CL 105 09/08/2017   CREATININE  1.88 (H) 09/08/2017   BUN 33 (H) 09/08/2017   CO2 28 09/08/2017   TSH 3.04 08/25/2017   INR 1.01 03/27/2015   HGBA1C 9.4 (H) 09/07/2017   MICROALBUR 742.5 01/03/2017    Ct Abdomen Pelvis Wo Contrast  Result Date: 09/07/2017 CLINICAL DATA:  Sudden onset sharp right lower quadrant abdominal pain yesterday. Continued pain today with nausea. EXAM: CT ABDOMEN AND PELVIS WITHOUT CONTRAST TECHNIQUE: Multidetector CT imaging of the abdomen and pelvis was performed following the standard protocol without IV contrast. COMPARISON:  Chest and abdomen radiographs dated 07/19/2015. FINDINGS: Lower chest: Minimal bibasilar linear atelectasis or scarring. Hepatobiliary: Surgically absent gallbladder. Normal appearing liver. Pancreas: Mild diffuse pancreatic atrophy. Spleen: Normal in size without focal abnormality. Adrenals/Urinary Tract: Post lumpectomy changes on the left. Normal appearing adrenal glands, right kidney, right ureter and urinary bladder. Stomach/Bowel: Surgically absent appendix. Unremarkable stomach, small bowel and colon. Vascular/Lymphatic: Atheromatous arterial calcifications without aneurysm. No enlarged lymph nodes. Reproductive: Status post hysterectomy. No adnexal masses. Other: Moderate-sized infraumbilical hernia containing fat and fluid. The  herniated fat and fluid measures 6 cm in maximum diameter. The hernia defect measures 1.4 cm in diameter. Musculoskeletal: Lumbar and lower thoracic spine degenerative changes. Ray cages at the L4-5 and L5-S1 levels. There are also laminectomy defects at those levels with posterior bone fusion. IMPRESSION: 1. Moderate-sized infraumbilical hernia containing fat and fluid. The fluid could be an indication of inflammation or vascular compromise of the herniated fat. 2. Otherwise, no acute abnormality. Electronically Signed   By: Claudie Revering M.D.   On: 09/07/2017 18:54    Assessment & Plan:   Problem List Items Addressed This Visit    CKD (chronic kidney disease), stage III ([place])    With solitary kidney,  Microscopic hematuria and  Nephrotic  range proteinuria  Stable by repeat labs.  Patient is seeing [facility]  Lab Results  Component Value Date   CREATININE 1.88 (H) 09/08/2017   Lab Results  Component Value Date   NA 140 09/08/2017   K 3.7 09/08/2017   CL 105 09/08/2017   CO2 28 09/08/2017         COPD (chronic obstructive pulmonary disease) with chronic bronchitis (HCC)    COPD exacerbation has resolved without prednisone       Morbid obesity ([place])    I have addressed  BMI and recommended wt loss of 10% of body weigh over the next 6 months using a low glycemic index diet and regular exercise a minimum of 5 days per week.        Tobacco abuse counseling    Spent 3 minutes discussing risk of continued tobacco abuse, including but not limited to CAD, PAD, hypertension, and CA.  She is not interested in pharmacotherapy at this time.        A total of 25 minutes of face to face time was spent with patient more than half of which was spent in counselling about the above mentioned conditions  and coordination of care  I am having Khalidah R. Steve maintain her calcium carbonate, DAILY MULTIPLE VITAMINS PO, insulin lispro, fluticasone, mometasone, insulin NPH Human,  aspirin, nitroGLYCERIN, sodium chloride, AEROCHAMBER MV, bisacodyl, budesonide-formoterol, irbesartan, carvedilol, co-enzyme Q-10, amLODipine, chlorpheniramine-HYDROcodone, torsemide, atorvastatin, metolazone, and albuterol. We will continue to administer lactated ringers, ondansetron (ZOFRAN) 4 mg in sodium chloride 0.9 % 50 mL IVPB, albuterol, and traMADol.  No orders of the defined types were placed in this encounter.   There are no discontinued medications.  Follow-up: No Follow-up on file.   Crecencio Mc, MD

## 2017-10-06 NOTE — Assessment & Plan Note (Signed)
With solitary kidney,  Microscopic hematuria and  Nephrotic  range proteinuria  Stable by repeat labs.  Patient is seeing [facility]  Lab Results  Component Value Date   CREATININE 1.88 (H) 09/08/2017   Lab Results  Component Value Date   NA 140 09/08/2017   K 3.7 09/08/2017   CL 105 09/08/2017   CO2 28 09/08/2017

## 2017-10-06 NOTE — Assessment & Plan Note (Signed)
Spent 3 minutes discussing risk of continued tobacco abuse, including but not limited to CAD, PAD, hypertension, and CA.  She is not interested in pharmacotherapy at this time. 

## 2017-10-06 NOTE — Assessment & Plan Note (Signed)
COPD exacerbation has resolved without prednisone

## 2017-10-06 NOTE — Assessment & Plan Note (Signed)
I have addressed  BMI and recommended wt loss of 10% of body weigh over the next 6 months using a low glycemic index diet and regular exercise a minimum of 5 days per week.   

## 2017-10-11 ENCOUNTER — Other Ambulatory Visit: Payer: Self-pay

## 2017-10-12 ENCOUNTER — Ambulatory Visit: Payer: Medicare Other | Admitting: General Surgery

## 2017-10-17 ENCOUNTER — Encounter: Payer: Self-pay | Admitting: Pulmonary Disease

## 2017-10-17 ENCOUNTER — Ambulatory Visit (INDEPENDENT_AMBULATORY_CARE_PROVIDER_SITE_OTHER): Payer: Medicare Other | Admitting: Pulmonary Disease

## 2017-10-17 VITALS — BP 142/82 | HR 67 | Ht 60.0 in | Wt 196.1 lb

## 2017-10-17 DIAGNOSIS — F17200 Nicotine dependence, unspecified, uncomplicated: Secondary | ICD-10-CM

## 2017-10-17 DIAGNOSIS — J4489 Other specified chronic obstructive pulmonary disease: Secondary | ICD-10-CM

## 2017-10-17 DIAGNOSIS — J449 Chronic obstructive pulmonary disease, unspecified: Secondary | ICD-10-CM

## 2017-10-17 DIAGNOSIS — I70212 Atherosclerosis of native arteries of extremities with intermittent claudication, left leg: Secondary | ICD-10-CM

## 2017-10-17 NOTE — Progress Notes (Signed)
Subjective:    Patient ID: Andrea Stewart, female    DOB: 1946-03-15, 71 y.o.   MRN: [id number]  Synopsis: Referred in March 2016 for evaluation of ongoing shortness of breath. She had a CT scan performed at [facility] in 2015 which demonstrated tree-in-bud abnormalities in the right upper lobe as well as a left lower lobe pulmonary nodule. A repeat CT scan was performed in January 2016 showing a right lower lobe pleural-based pulmonary nodule 1.27 m in size. This resolved on a April 2016 CT chest.   Quit smoking in 2018> she restarted again.    HPI Chief Complaint  Patient presents with  . Follow-up   She had a hernia "rupture" and had emergency surgery. AFerwards she had a few episodes of wheezing.  She had surgery in October 2018.  She is now feeling much better.  She says that she was frustrated by her glucose control while hospitalized.  After the surgery she says that she felt like she was going catch.  Prior to that she had been doing OK.    Past Medical History:  Diagnosis Date  . Backache, unspecified    s/p c-spine and l-spine fusions  . Degeneration of intervertebral disc, site unspecified   . Depressive disorder, not elsewhere classified   . Disorder of bone and cartilage, unspecified   . HTN (hypertension)   . Hx of left heart catheterization by cutdown    a. 7 years ago; no significant cad; b. Lexiscan 2014: no significant ischemia, no significant EKG changes concerning for ischemia, EF 67%, overall low risk study  . Kidney failure    Dr Cyndia Diver  . Myalgia and myositis, unspecified   . Obesity, unspecified   . Personal history of tobacco use, presenting hazards to health    1/2 ppd  . Plantar fascial fibromatosis   . S/P cholecystectomy   . S/p nephrectomy   . Type II or unspecified type diabetes mellitus without mention of complication, not stated as uncontrolled   . Unspecified essential hypertension   . Unspecified hereditary and idiopathic peripheral neuropathy      secondary to diabetes      Review of Systems  Constitutional: Negative for chills, fatigue and fever.  HENT: Negative for postnasal drip, rhinorrhea and sinus pressure.   Respiratory: Positive for shortness of breath. Negative for cough and wheezing.   Cardiovascular: Negative for chest pain, palpitations and leg swelling.       Objective:   Physical Exam Vitals:   10/17/17 1024  BP: (!) 142/82  Pulse: 67  SpO2: 95%  Weight: 196 lb 2 oz (89 kg)  Height: 5' (1.524 m)   RA  Gen: well appearing HENT: OP clear, TM's clear, neck supple PULM: CTA B, normal percussion CV: RRR, no mgr, trace edema GI: BS+, soft, nontender Derm: no cyanosis or rash Psyche: normal mood and affect   Chest imaging: 11/20/2014 CT chest> tree in bud, inflammatory appearing abnormalities in the right upper lobe, left upper lobe pleural-based 1.2 cm solid nodule 02/2015 CT chest > right upper lobe tree-in-bud abnormalities persistent, the pulmonary nodule seen on the previous study has resolved, thyroid nodule stable  PFT: 01/2015 PFT> normal ratio but consistent with moderate obstruction (FEV1 1.16L (58% pred)), TLC 4.57L (99% pred), RV 133% pred, DLCO 27.6 (136% pred)  Micro: 2016 Bronch> AFB bal negative  CBC    Component Value Date/Time   WBC 8.2 09/07/2017 1845   RBC 4.14 09/07/2017 1845   HGB 12.8  09/07/2017 1845   HCT 37.8 09/07/2017 1845   PLT 238 09/07/2017 1845   MCV 91.4 09/07/2017 1845   MCH 30.9 09/07/2017 1845   MCHC 33.8 09/07/2017 1845   RDW 14.3 09/07/2017 1845   LYMPHSABS 1.9 09/07/2017 1845   MONOABS 0.7 09/07/2017 1845   EOSABS 0.5 09/07/2017 1845   BASOSABS 0.1 09/07/2017 1845        Assessment & Plan:   COPD (chronic obstructive pulmonary disease) with chronic bronchitis (HCC)  Tobacco use disorder  Discussion: This has been a stable interval for her aside from one mild exacerbation associated with her recent emergent surgery.  Since then she is done fairly  well.  I am disappointed by the fact that she started smoking again.  We talked about the importance of quitting smoking at length today.  She has had a flu shot which is good.  Plan: COPD: Keep taking Symbicort 2 puffs twice a day no matter how you feel Use albuterol as needed for chest tightness wheezing or shortness of breath Glad he had a flu shot Stay active Practice good hand hygiene this time a year  Tobacco abuse: Quit smoking right away  Follow-up 6 months or sooner if needed    Current Outpatient Medications:  .  albuterol (PROVENTIL HFA) 108 (90 Base) MCG/ACT inhaler, Inhale 2 puffs every 4 (four) hours as needed into the lungs for wheezing or shortness of breath., Disp: 1 Inhaler, Rfl: 2 .  amLODipine (NORVASC) 10 MG tablet, TAKE 1 TABLET BY MOUTH EVERY DAY., Disp: 90 tablet, Rfl: 1 .  aspirin 81 MG tablet, Take 1 tablet (81 mg total) by mouth daily., Disp: , Rfl:  .  atorvastatin (LIPITOR) 20 MG tablet, TAKE 1 TABLET BY MOUTH EVERY DAY, Disp: 90 tablet, Rfl: 0 .  bisacodyl (DULCOLAX) 10 MG suppository, Place 1 suppository (10 mg total) rectally as needed for moderate constipation., Disp: 12 suppository, Rfl: 0 .  budesonide-formoterol (SYMBICORT) 160-4.5 MCG/ACT inhaler, Inhale 2 puffs into the lungs 2 (two) times daily., Disp: 1 Inhaler, Rfl: 5 .  calcium carbonate (OS-CAL) 600 MG TABS, Take 600 mg by mouth daily.  , Disp: , Rfl:  .  carvedilol (COREG) 6.25 MG tablet, Take 1 tablet (6.25 mg total) by mouth 2 (two) times daily with a meal. (Patient taking differently: Take 6.25 mg by mouth daily. ), Disp: 60 tablet, Rfl: 3 .  chlorpheniramine-HYDROcodone (TUSSIONEX PENNKINETIC ER) 10-8 MG/5ML SUER, Take 5 mLs by mouth at bedtime as needed., Disp: 140 mL, Rfl: 0 .  co-enzyme Q-10 30 MG capsule, Take 30 mg by mouth daily., Disp: , Rfl:  .  DAILY MULTIPLE VITAMINS PO, Take 1 tablet by mouth daily.  , Disp: , Rfl:  .  fluticasone (VERAMYST) 27.5 MCG/SPRAY nasal spray, Place 2  sprays into the nose as needed for rhinitis or allergies. , Disp: , Rfl:  .  insulin lispro (HUMALOG) 100 UNIT/ML injection, Inject into the skin as directed. Sliding scale 100-150 = 2-2.5 units 150-200 = 3-3.5 units 200-250 = 4-4.5 units 251-300 = 5-5.5 units 301-350 = 6-6.5 units 350-400 = 7-7.5 units, Disp: , Rfl:  .  insulin NPH (HUMULIN N,NOVOLIN N) 100 UNIT/ML injection, Inject 22 Units into the skin daily before breakfast. 24 units at bedtime, Disp: , Rfl:  .  irbesartan (AVAPRO) 150 MG tablet, Take 150 mg by mouth 2 (two) times daily. , Disp: , Rfl:  .  metolazone (ZAROXOLYN) 2.5 MG tablet, TAKE 1 TABLET BY MOUTH DAILY 30  MINUTES PRIOR TO TORSEMIDE, Disp: 30 tablet, Rfl: 2 .  mometasone (NASONEX) 50 MCG/ACT nasal spray, Place 2 sprays into the nose daily as needed. , Disp: , Rfl:  .  nitroGLYCERIN (NITROSTAT) 0.4 MG SL tablet, Place 1 tablet (0.4 mg total) under the tongue every 5 (five) minutes as needed. Maximum 3 tablets (Patient taking differently: Place 0.4 mg under the tongue every 5 (five) minutes as needed for chest pain. Maximum 3 tablets), Disp: 25 tablet, Rfl: 6 .  sodium chloride (OCEAN) 0.65 % SOLN nasal spray, Place 2 sprays into both nostrils as needed for congestion., Disp: , Rfl:  .  Spacer/Aero-Holding Chambers (AEROCHAMBER MV) inhaler, Use as instructed, Disp: 1 each, Rfl: 0 .  torsemide (DEMADEX) 10 MG tablet, TAKE ONE-HALF TABLET BY MOUTH DAILY, Disp: 15 tablet, Rfl: 0  Current Facility-Administered Medications:  .  albuterol (PROVENTIL HFA;VENTOLIN HFA) 108 (90 Base) MCG/ACT inhaler 2 puff, 2 puff, Inhalation, Q4H PRN, Byrnett, Forest Gleason, MD .  lactated ringers infusion, , Intravenous, Continuous, Byrnett, Forest Gleason, MD .  ondansetron (ZOFRAN) 4 mg in sodium chloride 0.9 % 50 mL IVPB, 4 mg, Intravenous, Q4H PRN, Robert Bellow, MD .  traMADol Veatrice Bourbon) tablet 50 mg, 50 mg, Oral, Q4H PRN, Bary Castilla, Forest Gleason, MD

## 2017-10-17 NOTE — Patient Instructions (Signed)
COPD: Keep taking Symbicort 2 puffs twice a day no matter how you feel Use albuterol as needed for chest tightness wheezing or shortness of breath Glad he had a flu shot Stay active Practice good hand hygiene this time a year  Tobacco abuse: Quit smoking right away  Follow-up 6 months or sooner if needed

## 2017-10-19 ENCOUNTER — Other Ambulatory Visit: Payer: Self-pay | Admitting: Internal Medicine

## 2017-11-10 ENCOUNTER — Encounter: Payer: Self-pay | Admitting: General Surgery

## 2017-11-21 ENCOUNTER — Other Ambulatory Visit: Payer: Self-pay | Admitting: Internal Medicine

## 2017-11-28 DIAGNOSIS — E1165 Type 2 diabetes mellitus with hyperglycemia: Secondary | ICD-10-CM | POA: Diagnosis not present

## 2017-11-28 DIAGNOSIS — R809 Proteinuria, unspecified: Secondary | ICD-10-CM | POA: Diagnosis not present

## 2017-11-28 DIAGNOSIS — E7800 Pure hypercholesterolemia, unspecified: Secondary | ICD-10-CM | POA: Diagnosis not present

## 2017-11-28 DIAGNOSIS — G609 Hereditary and idiopathic neuropathy, unspecified: Secondary | ICD-10-CM | POA: Diagnosis not present

## 2017-11-28 DIAGNOSIS — I10 Essential (primary) hypertension: Secondary | ICD-10-CM | POA: Diagnosis not present

## 2017-11-29 ENCOUNTER — Encounter (INDEPENDENT_AMBULATORY_CARE_PROVIDER_SITE_OTHER): Payer: Self-pay | Admitting: Vascular Surgery

## 2017-11-29 ENCOUNTER — Ambulatory Visit (INDEPENDENT_AMBULATORY_CARE_PROVIDER_SITE_OTHER): Payer: Medicare Other | Admitting: Vascular Surgery

## 2017-11-29 ENCOUNTER — Ambulatory Visit (INDEPENDENT_AMBULATORY_CARE_PROVIDER_SITE_OTHER): Payer: Medicare Other

## 2017-11-29 VITALS — BP 171/79 | HR 69 | Resp 16 | Wt 197.0 lb

## 2017-11-29 DIAGNOSIS — I10 Essential (primary) hypertension: Secondary | ICD-10-CM

## 2017-11-29 DIAGNOSIS — N183 Chronic kidney disease, stage 3 unspecified: Secondary | ICD-10-CM

## 2017-11-29 DIAGNOSIS — I70212 Atherosclerosis of native arteries of extremities with intermittent claudication, left leg: Secondary | ICD-10-CM | POA: Diagnosis not present

## 2017-11-29 DIAGNOSIS — E1165 Type 2 diabetes mellitus with hyperglycemia: Secondary | ICD-10-CM

## 2017-11-29 DIAGNOSIS — F17200 Nicotine dependence, unspecified, uncomplicated: Secondary | ICD-10-CM | POA: Diagnosis not present

## 2017-11-29 DIAGNOSIS — E118 Type 2 diabetes mellitus with unspecified complications: Secondary | ICD-10-CM

## 2017-11-29 DIAGNOSIS — IMO0002 Reserved for concepts with insufficient information to code with codable children: Secondary | ICD-10-CM

## 2017-11-29 NOTE — Assessment & Plan Note (Signed)
Her ABIs today are basically stable at 0.82 on the right and 0.80 on the left with digit pressure and waveforms which are nearly normal.  Overall, her perfusion is reasonably well maintained and I would recommend medical management and increasing her activity.  Smoking cessation would be of great benefit.  Plan to recheck in about 6 months or sooner if problems develop in the interim.

## 2017-11-29 NOTE — Progress Notes (Signed)
MRN : [id number]  Andrea Stewart is a 72 y.o. (08/15/1946) female who presents with chief complaint of  Chief Complaint  Patient presents with  . Follow-up    4-24moabi  .  History of Present Illness: Patient returns today in follow up of PAD.  She still has some mild claudication symptoms, but these are not really lifestyle no new rest pain or ulceration. Her ABIs today are basically stable at 0.82 on the right and 0.80 on the left with digit pressure and waveforms which are nearly normal.        Past Medical History:  Diagnosis Date  . Backache, unspecified    s/p c-spine and l-spine fusions  . Degeneration of intervertebral disc, site unspecified   . Depressive disorder, not elsewhere classified   . Disorder of bone and cartilage, unspecified   . GERD (gastroesophageal reflux disease)   . HTN (hypertension)   . Hx of left heart catheterization by cutdown    a. 7 years ago; no significant cad; b. Lexiscan 2014: no significant ischemia, no significant EKG changes concerning for ischemia, EF 67%, overall low risk study  . Myalgia and myositis, unspecified   . Obesity, unspecified   . Personal history of tobacco use, presenting hazards to health    1/2 ppd  . Plantar fascial fibromatosis   . S/P cholecystectomy   . S/p nephrectomy   . Type II or unspecified type diabetes mellitus without mention of complication, not stated as uncontrolled   . Unspecified essential hypertension   . Unspecified hereditary and idiopathic peripheral neuropathy    secondary to diabetes         Past Surgical History:  Procedure Laterality Date  . ABDOMINAL HYSTERECTOMY    . APPENDECTOMY    . BACK SURGERY     2006  . CARDIAC CATHETERIZATION     o.k. 2003  . CARPAL TUNNEL RELEASE    . CERVICAL DISCECTOMY    . CHOLECYSTECTOMY    . COLONOSCOPY  09/1999   colonoscopy-hemorrhoids, re-check 5 years (10/2006)  . dexa     osteopenia (01/2004)  .  ELBOW SURGERY    . FOOT SURGERY     x 3  . KIDNEY DONATION  1991  . LUMBAR FUSION    . NECK SURGERY     06/2005  . Problem with general Anesthesia     02/2006  . REPLACEMENT TOTAL KNEE  10/2004  . trigger finger surgery    . VESICOVAGINAL FISTULA CLOSURE W/ TAH    . VIDEO BRONCHOSCOPY Bilateral 04/02/2015   Procedure: VIDEO BRONCHOSCOPY WITHOUT FLUORO;  Surgeon: DJuanito Doom MD;  [facility]  Service: Cardiopulmonary;  Laterality: Bilateral;         Family History  Problem Relation Age of Onset  . Hypertension Mother   . Colon cancer Mother   . Cancer Mother        colon  . Coronary artery disease Mother   . Prostate cancer Father   . COPD Father   . Cancer Father        prostate  . Heart attack Brother 668 . Sleep apnea Brother     Social History       Social History  Substance Use Topics  . Smoking status: Current Every Day Smoker    Packs/day: 0.25    Years: 30.00    Types: Cigarettes  . Smokeless tobacco: Never Used     Comment: currently smoking .25 ppd,  trying to quit 02/13/16  . Alcohol use No  No IVDU       Allergies  Allergen Reactions  . Morphine Anaphylaxis  . Prednisone Other (See Comments)    "makes me think I am having a heart attack"  . Gabapentin Other (See Comments)    intolerance  . Nsaids Diarrhea  . Penicillins Other (See Comments)    REACTION: rash, swelling. Remote reaction as a child to [place]. Patient did tolerate recent course of augmentin           Current Outpatient Prescriptions  Medication Sig Dispense Refill  . albuterol (PROVENTIL HFA) 108 (90 BASE) MCG/ACT inhaler Inhale 2 puffs into the lungs every 4 (four) hours as needed for wheezing or shortness of breath. 1 Inhaler 2  . amLODipine (NORVASC) 10 MG tablet Take one tablet ('10mg'$ )  daily 90 tablet 3  . aspirin 81 MG tablet Take 1 tablet (81 mg total) by mouth daily.    . bisacodyl (DULCOLAX) 10 MG  suppository Place 1 suppository (10 mg total) rectally as needed for moderate constipation. 12 suppository 0  . budesonide-formoterol (SYMBICORT) 160-4.5 MCG/ACT inhaler Inhale 2 puffs into the lungs 2 (two) times daily. 1 Inhaler 5  . calcium carbonate (OS-CAL) 600 MG TABS Take 600 mg by mouth daily.      . carvedilol (COREG) 3.125 MG tablet Take 3.125 mg by mouth 2 (two) times daily with a meal.     . chlorpheniramine-HYDROcodone (TUSSIONEX PENNKINETIC ER) 10-8 MG/5ML SUER Take 5 mLs by mouth every 12 (twelve) hours as needed. 115 mL 0  . cloNIDine (CATAPRES) 0.1 MG tablet Take 1 tablet by mouth 2 (two) times daily.    Marland Kitchen DAILY MULTIPLE VITAMINS PO Take 1 tablet by mouth daily.      . fluticasone (VERAMYST) 27.5 MCG/SPRAY nasal spray Place 2 sprays into the nose as needed for rhinitis or allergies.     . furosemide (LASIX) 20 MG tablet Take 20 mg by mouth daily.    . furosemide (LASIX) 40 MG tablet Take 40 mg by mouth daily as needed.    . insulin lispro (HUMALOG) 100 UNIT/ML injection Inject into the skin as directed. Sliding scale 100-150 = 2-2.5 units 150-200 = 3-3.5 units 200-250 = 4-4.5 units 251-300 = 5-5.5 units 301-350 = 6-6.5 units 350-400 = 7-7.5 units    . insulin NPH (HUMULIN N,NOVOLIN N) 100 UNIT/ML injection Inject 20 Units into the skin daily before breakfast. 22 units at bedtime    . levofloxacin (LEVAQUIN) 750 MG tablet Take 1 tablet (750 mg total) by mouth every other day. 5 tablet 0  . metFORMIN (GLUCOPHAGE-XR) 500 MG 24 hr tablet Take 500 mg by mouth 2 (two) times daily.     . mometasone (NASONEX) 50 MCG/ACT nasal spray Place 2 sprays into the nose daily as needed.     . nitroGLYCERIN (NITROSTAT) 0.4 MG SL tablet Place 1 tablet (0.4 mg total) under the tongue every 5 (five) minutes as needed. Maximum 3 tablets (Patient taking differently: Place 0.4 mg under the tongue every 5 (five) minutes as needed for chest pain. Maximum 3 tablets) 25 tablet 6  .  sodium chloride (OCEAN) 0.65 % SOLN nasal spray Place 2 sprays into both nostrils as needed for congestion.    Marland Kitchen Spacer/Aero-Holding Chambers (AEROCHAMBER MV) inhaler Use as instructed 1 each 0  . traMADol (ULTRAM) 50 MG tablet Take 50 mg by mouth every 8 (eight) hours as needed for moderate pain or  severe pain.    . valsartan (DIOVAN) 160 MG tablet Take 1 tablet (160 mg total) by mouth at bedtime. (Patient taking differently: Take 160 mg by mouth 2 (two) times daily. ) 90 tablet 3   No current facility-administered medications for this visit.       REVIEW OF SYSTEMS (Negative unless checked)  Constitutional: '[]'$ Weight loss  '[]'$ Fever  '[]'$ Chills Cardiac: '[]'$ Chest pain   '[]'$ Chest pressure   '[]'$ Palpitations   '[]'$ Shortness of breath when laying flat   '[]'$ Shortness of breath at rest   '[x]'$ Shortness of breath with exertion. Vascular:  '[]'$ Pain in legs with walking   '[]'$ Pain in legs at rest   '[]'$ Pain in legs when laying flat   '[]'$ Claudication   '[]'$ Pain in feet when walking  '[]'$ Pain in feet at rest  '[]'$ Pain in feet when laying flat   '[]'$ History of DVT   '[]'$ Phlebitis   '[x]'$ Swelling in legs   '[]'$ Varicose veins   '[]'$ Non-healing ulcers Pulmonary:   '[]'$ Uses home oxygen   '[]'$ Productive cough   '[]'$ Hemoptysis   '[]'$ Wheeze  '[]'$ COPD   '[]'$ Asthma Neurologic:  '[]'$ Dizziness  '[]'$ Blackouts   '[]'$ Seizures   '[]'$ History of stroke   '[]'$ History of TIA  '[]'$ Aphasia   '[]'$ Temporary blindness   '[]'$ Dysphagia   '[]'$ Weakness or numbness in arms   '[]'$ Weakness or numbness in legs Musculoskeletal:  '[x]'$ Arthritis   '[]'$ Joint swelling   '[]'$ Joint pain   '[x]'$ Low back pain Hematologic:  '[x]'$ Easy bruising  '[]'$ Easy bleeding   '[]'$ Hypercoagulable state   '[]'$ Anemic  '[]'$ Hepatitis Gastrointestinal:  '[]'$ Blood in stool   '[]'$ Vomiting blood  '[x]'$ Gastroesophageal reflux/heartburn   '[]'$ Abdominal pain Genitourinary:  '[x]'$ Chronic kidney disease   '[]'$ Difficult urination  '[]'$ Frequent urination  '[]'$ Burning with urination   '[]'$ Hematuria Skin:  '[]'$ Rashes   '[]'$ Ulcers   '[]'$ Wounds Psychological:  '[]'$ History of  anxiety     Physical Examination  BP (!) 171/79 (BP Location: Left Arm)   Pulse 69   Resp 16   Wt 89.4 kg (197 lb)   BMI 38.47 kg/m  Gen:  WD/WN, NAD Head: [place]/AT, No temporalis wasting. Ear/Nose/Throat: Hearing grossly intact, nares w/o erythema or drainage, trachea midline Eyes: Conjunctiva clear. Sclera non-icteric Neck: Supple.  No JVD.  Pulmonary:  Good air movement, no use of accessory muscles.  Cardiac: RRR, normal S1, S2 Vascular:  Vessel Right Left  Radial Palpable Palpable                          PT  1+ palpable  1+ palpable  DP  1+ palpable  1+ palpable    Musculoskeletal: M/S 5/5 throughout.  No deformity or atrophy.  Neurologic: Sensation grossly intact in extremities.  Symmetrical.  Speech is fluent.  Psychiatric: Judgment intact, Mood & affect appropriate for pt's clinical situation. Dermatologic: No rashes or ulcers noted.  No cellulitis or open wounds.       Labs Recent Results (from the past 2160 hour(s))  CBC WITH DIFFERENTIAL     Status: None   Collection Time: 09/07/17  6:45 PM  Result Value Ref Range   WBC 8.2 3.6 - 11.0 K/uL   RBC 4.14 3.80 - 5.20 MIL/uL   Hemoglobin 12.8 12.0 - 16.0 g/dL   HCT 37.8 35.0 - 47.0 %   MCV 91.4 80.0 - 100.0 fL   MCH 30.9 26.0 - 34.0 pg   MCHC 33.8 32.0 - 36.0 g/dL   RDW 14.3 11.5 - 14.5 %   Platelets 238 150 - 440 K/uL   Neutrophils  Relative % 60 %   Neutro Abs 5.0 1.4 - 6.5 K/uL   Lymphocytes Relative 24 %   Lymphs Abs 1.9 1.0 - 3.6 K/uL   Monocytes Relative 9 %   Monocytes Absolute 0.7 0.2 - 0.9 K/uL   Eosinophils Relative 6 %   Eosinophils Absolute 0.5 0 - 0.7 K/uL   Basophils Relative 1 %   Basophils Absolute 0.1 0 - 0.1 K/uL  Basic metabolic panel     Status: Abnormal   Collection Time: 09/07/17  6:45 PM  Result Value Ref Range   Sodium 138 135 - 145 mmol/L   Potassium 4.1 3.5 - 5.1 mmol/L    Comment: HEMOLYSIS AT THIS LEVEL MAY AFFECT RESULT   Chloride 103 101 - 111 mmol/L   CO2 26 22  - 32 mmol/L   Glucose, Bld 229 (H) 65 - 99 mg/dL   BUN 36 (H) 6 - 20 mg/dL   Creatinine, Ser 2.37 (H) 0.44 - 1.00 mg/dL   Calcium 9.2 8.9 - 10.3 mg/dL   GFR calc non Af Amer 19 (L) >60 mL/min   GFR calc Af Amer 22 (L) >60 mL/min    Comment: (NOTE) The eGFR has been calculated using the CKD EPI equation. This calculation has not been validated in all clinical situations. eGFR's persistently <60 mL/min signify possible Chronic Kidney Disease.    Anion gap 9 5 - 15  Hemoglobin A1c     Status: Abnormal   Collection Time: 09/07/17  6:45 PM  Result Value Ref Range   Hgb A1c MFr Bld 9.4 (H) 4.8 - 5.6 %    Comment: (NOTE) Pre diabetes:          5.7%-6.4% Diabetes:              >6.4% Glycemic control for   <7.0% adults with diabetes    Mean Plasma Glucose 223.08 mg/dL    Comment: Performed at [facility] [address], [place], [place] [address]  Surgical pcr screen     Status: None   Collection Time: 09/07/17  7:40 PM  Result Value Ref Range   MRSA, PCR NEGATIVE NEGATIVE   Staphylococcus aureus NEGATIVE NEGATIVE    Comment: (NOTE) The Xpert SA Assay (FDA approved for NASAL specimens in patients 49 years of age and older), is one component of a comprehensive surveillance program. It is not intended to diagnose infection nor to guide or monitor treatment.   Glucose, capillary     Status: Abnormal   Collection Time: 09/07/17  9:28 PM  Result Value Ref Range   Glucose-Capillary 160 (H) 65 - 99 mg/dL  Basic metabolic panel     Status: Abnormal   Collection Time: 09/08/17  3:44 AM  Result Value Ref Range   Sodium 140 135 - 145 mmol/L   Potassium 3.7 3.5 - 5.1 mmol/L   Chloride 105 101 - 111 mmol/L   CO2 28 22 - 32 mmol/L   Glucose, Bld 141 (H) 65 - 99 mg/dL   BUN 33 (H) 6 - 20 mg/dL   Creatinine, Ser 1.88 (H) 0.44 - 1.00 mg/dL   Calcium 8.6 (L) 8.9 - 10.3 mg/dL   GFR calc non Af Amer 26 (L) >60 mL/min   GFR calc Af Amer 30 (L) >60 mL/min    Comment: (NOTE) The eGFR  has been calculated using the CKD EPI equation. This calculation has not been validated in all clinical situations. eGFR's persistently <60 mL/min signify possible Chronic Kidney Disease.  Anion gap 7 5 - 15  Glucose, capillary     Status: Abnormal   Collection Time: 09/08/17 11:05 AM  Result Value Ref Range   Glucose-Capillary 195 (H) 65 - 99 mg/dL   Comment 1 Notify RN   Glucose, capillary     Status: Abnormal   Collection Time: 09/08/17  1:12 PM  Result Value Ref Range   Glucose-Capillary 244 (H) 65 - 99 mg/dL  Glucose, capillary     Status: Abnormal   Collection Time: 09/08/17  4:17 PM  Result Value Ref Range   Glucose-Capillary 264 (H) 65 - 99 mg/dL   Comment 1 Notify RN   Glucose, capillary     Status: Abnormal   Collection Time: 09/08/17  9:53 PM  Result Value Ref Range   Glucose-Capillary 303 (H) 65 - 99 mg/dL  Glucose, capillary     Status: Abnormal   Collection Time: 09/09/17  7:40 AM  Result Value Ref Range   Glucose-Capillary 382 (H) 65 - 99 mg/dL   Comment 1 Notify RN   Glucose, capillary     Status: Abnormal   Collection Time: 09/09/17  9:26 AM  Result Value Ref Range   Glucose-Capillary 469 (H) 65 - 99 mg/dL   Comment 1 Notify RN   Glucose, capillary     Status: Abnormal   Collection Time: 09/09/17 10:33 AM  Result Value Ref Range   Glucose-Capillary 443 (H) 65 - 99 mg/dL   Comment 1 Notify RN   Glucose, capillary     Status: Abnormal   Collection Time: 09/09/17 11:32 AM  Result Value Ref Range   Glucose-Capillary 424 (H) 65 - 99 mg/dL   Comment 1 Notify RN   Basic metabolic panel     Status: Abnormal   Collection Time: 09/21/17 12:00 AM  Result Value Ref Range   Glucose 214    BUN 25 (A) 4 - 21   Creatinine 2.0 (A) 0.5 - 1.1   Potassium 4.4 3.4 - 5.3   Sodium 141 137 - 147    Radiology No results found.    Assessment/Plan Essential hypertension blood pressure control important in reducing the progression of atherosclerotic disease. On  appropriate oral medications.   Diabetes mellitus type 2 with complications, uncontrolled blood glucose control important in reducing the progression of atherosclerotic disease. Also, involved in wound healing. On appropriate medications.   CKD (chronic kidney disease), stage III Would plan limited contrast used with any intervention.  Tobacco use disorder We had a discussion for approximately 3 minutes regarding the absolute need for smoking cessation due to the deleterious nature of tobacco on the vascular system. We discussed the tobacco use would diminish patency of any intervention, and likely significantly worsen progressio of disease. We discussed multiple agents for quitting including replacement therapy or medications to reduce cravings such as Chantix. The patient voices their understanding of the importance of smoking cessation.   Atherosclerosis of native arteries of extremity with intermittent claudication (HCC) Her ABIs today are basically stable at 0.82 on the right and 0.80 on the left with digit pressure and waveforms which are nearly normal.  Overall, her perfusion is reasonably well maintained and I would recommend medical management and increasing her activity.  Smoking cessation would be of great benefit.  Plan to recheck in about 6 months or sooner if problems develop in the interim.    Leotis Pain, MD  11/29/2017 4:05 PM    This note was created with Dragon medical transcription system.  Any errors from dictation are purely unintentional

## 2017-11-29 NOTE — Patient Instructions (Signed)

## 2017-12-08 ENCOUNTER — Telehealth: Payer: Self-pay

## 2017-12-08 NOTE — Telephone Encounter (Signed)
Copied from [place] [phone]. Topic: Inquiry >> Dec 08, 2017 10:20 AM Corie Chiquito, [place] wrote: Reason for CRM: Patient calling because she was told that she would have to make an appointment with Dr.Tullo in order for her to get diabetic shoes, but she isn't sure if that's what she is supposed to do.If someone could give her a call to verify this information for her at [phone]

## 2017-12-08 NOTE — Telephone Encounter (Signed)
Does pt need to schedule an appt with you for diabetic shoes or does she just need a DME/prescription for them?

## 2017-12-08 NOTE — Telephone Encounter (Signed)
I do not order diabetic shoes for patients  Because I know of no valid use for them.  If she is seeing a podiatrist who recommends them,  She will need to get them from her,

## 2017-12-13 NOTE — Telephone Encounter (Signed)
LMTCB. Need to let pt know that Dr. Derrel Nip does not order diabetic shoes because she knows of no valid reason for them but if she see a podiatrist who recommends them then she will need to schedule an appt them them to get the order.   PEC may speak with pt.

## 2017-12-15 NOTE — Telephone Encounter (Signed)
Called patient she has called already and made app with podiatrist. She does not have any questions.

## 2017-12-20 ENCOUNTER — Other Ambulatory Visit: Payer: Self-pay | Admitting: Internal Medicine

## 2017-12-20 DIAGNOSIS — Z1231 Encounter for screening mammogram for malignant neoplasm of breast: Secondary | ICD-10-CM

## 2017-12-29 DIAGNOSIS — R809 Proteinuria, unspecified: Secondary | ICD-10-CM | POA: Diagnosis not present

## 2017-12-29 DIAGNOSIS — R319 Hematuria, unspecified: Secondary | ICD-10-CM | POA: Diagnosis not present

## 2017-12-29 DIAGNOSIS — I129 Hypertensive chronic kidney disease with stage 1 through stage 4 chronic kidney disease, or unspecified chronic kidney disease: Secondary | ICD-10-CM | POA: Diagnosis not present

## 2017-12-29 DIAGNOSIS — E1122 Type 2 diabetes mellitus with diabetic chronic kidney disease: Secondary | ICD-10-CM | POA: Diagnosis not present

## 2017-12-29 DIAGNOSIS — N183 Chronic kidney disease, stage 3 (moderate): Secondary | ICD-10-CM | POA: Diagnosis not present

## 2017-12-30 DIAGNOSIS — E1122 Type 2 diabetes mellitus with diabetic chronic kidney disease: Secondary | ICD-10-CM | POA: Diagnosis not present

## 2017-12-30 DIAGNOSIS — N183 Chronic kidney disease, stage 3 (moderate): Secondary | ICD-10-CM | POA: Diagnosis not present

## 2017-12-30 DIAGNOSIS — I129 Hypertensive chronic kidney disease with stage 1 through stage 4 chronic kidney disease, or unspecified chronic kidney disease: Secondary | ICD-10-CM | POA: Diagnosis not present

## 2017-12-30 DIAGNOSIS — R319 Hematuria, unspecified: Secondary | ICD-10-CM | POA: Diagnosis not present

## 2017-12-30 DIAGNOSIS — R809 Proteinuria, unspecified: Secondary | ICD-10-CM | POA: Diagnosis not present

## 2018-01-03 ENCOUNTER — Encounter: Payer: Self-pay | Admitting: Internal Medicine

## 2018-01-03 ENCOUNTER — Ambulatory Visit (INDEPENDENT_AMBULATORY_CARE_PROVIDER_SITE_OTHER): Payer: Medicare Other | Admitting: Internal Medicine

## 2018-01-03 VITALS — BP 144/84 | HR 83 | Temp 98.5°F | Wt 196.0 lb

## 2018-01-03 DIAGNOSIS — I70212 Atherosclerosis of native arteries of extremities with intermittent claudication, left leg: Secondary | ICD-10-CM

## 2018-01-03 DIAGNOSIS — J069 Acute upper respiratory infection, unspecified: Secondary | ICD-10-CM | POA: Diagnosis not present

## 2018-01-03 DIAGNOSIS — J449 Chronic obstructive pulmonary disease, unspecified: Secondary | ICD-10-CM | POA: Diagnosis not present

## 2018-01-03 DIAGNOSIS — B9789 Other viral agents as the cause of diseases classified elsewhere: Secondary | ICD-10-CM

## 2018-01-03 MED ORDER — HYDROCODONE-HOMATROPINE 5-1.5 MG/5ML PO SYRP: 5.0000 mL | ORAL_SOLUTION | Freq: Three times a day (TID) | ORAL | 0 refills | Status: DC | PRN

## 2018-01-03 NOTE — Patient Instructions (Signed)
Upper Respiratory Infection, Adult Most upper respiratory infections (URIs) are caused by a virus. A URI affects the nose, throat, and upper air passages. The most common type of URI is often called "the common cold." Follow these instructions at home:  Take medicines only as told by your doctor.  Gargle warm saltwater or take cough drops to comfort your throat as told by your doctor.  Use a warm mist humidifier or inhale steam from a shower to increase air moisture. This may make it easier to breathe.  Drink enough fluid to keep your pee (urine) clear or pale yellow.  Eat soups and other clear broths.  Have a healthy diet.  Rest as needed.  Go back to work when your fever is gone or your doctor says it is okay. ? You may need to stay home longer to avoid giving your URI to others. ? You can also wear a face mask and wash your hands often to prevent spread of the virus.  Use your inhaler more if you have asthma.  Do not use any tobacco products, including cigarettes, chewing tobacco, or electronic cigarettes. If you need help quitting, ask your doctor. Contact a doctor if:  You are getting worse, not better.  Your symptoms are not helped by medicine.  You have chills.  You are getting more short of breath.  You have brown or red mucus.  You have yellow or brown discharge from your nose.  You have pain in your face, especially when you bend forward.  You have a fever.  You have puffy (swollen) neck glands.  You have pain while swallowing.  You have white areas in the back of your throat. Get help right away if:  You have very bad or constant: ? Headache. ? Ear pain. ? Pain in your forehead, behind your eyes, and over your cheekbones (sinus pain). ? Chest pain.  You have long-lasting (chronic) lung disease and any of the following: ? Wheezing. ? Long-lasting cough. ? Coughing up blood. ? A change in your usual mucus.  You have a stiff neck.  You have  changes in your: ? Vision. ? Hearing. ? Thinking. ? Mood. This information is not intended to replace advice given to you by your health care provider. Make sure you discuss any questions you have with your health care provider. Document Released: 04/19/2008 Document Revised: 07/04/2016 Document Reviewed: 02/06/2014 Elsevier Interactive Patient Education  2018 Elsevier Inc.  

## 2018-01-03 NOTE — Progress Notes (Signed)
HPI  Pt presents to the clinic today with c/o itchy ears, runny nose, nasal congestion, cough and chest congestion. This stated 2-3 days ago. She is blowing yellow mucous out of her nose. She denies ear pain or decreased hearing. The cough is productive of yellow mucous. She denies increased shortness of breath. She denies fever, chills or body aches. She has tried Tylenol, nasal Saline and Robitussion with minimal relief. She has a history of COPD. She has had sick contacts.  Review of Systems      Past Medical History:  Diagnosis Date  . Backache, unspecified    s/p c-spine and l-spine fusions  . Degeneration of intervertebral disc, site unspecified   . Depressive disorder, not elsewhere classified   . Disorder of bone and cartilage, unspecified   . HTN (hypertension)   . Hx of left heart catheterization by cutdown    a. 7 years ago; no significant cad; b. Lexiscan 2014: no significant ischemia, no significant EKG changes concerning for ischemia, EF 67%, overall low risk study  . Kidney failure    Dr Cyndia Diver  . Myalgia and myositis, unspecified   . Obesity, unspecified   . Personal history of tobacco use, presenting hazards to health    1/2 ppd  . Plantar fascial fibromatosis   . S/P cholecystectomy   . S/p nephrectomy   . Type II or unspecified type diabetes mellitus without mention of complication, not stated as uncontrolled   . Unspecified essential hypertension   . Unspecified hereditary and idiopathic peripheral neuropathy    secondary to diabetes    Family History  Problem Relation Age of Onset  . Hypertension Mother   . Colon cancer Mother   . Cancer Mother        colon  . Coronary artery disease Mother   . Diabetes Mother   . Prostate cancer Father   . COPD Father   . Cancer Father        prostate  . Heart attack Brother 32  . Sleep apnea Brother   . Diabetes Paternal Aunt   . Diabetes Paternal Uncle   . Cancer Daughter        Brain, lungs, liver, spine,  kidney    Social History   Socioeconomic History  . Marital status: Married    Spouse name: Not on file  . Number of children: 4  . Years of education: Not on file  . Highest education level: Not on file  Social Needs  . Financial resource strain: Not on file  . Food insecurity - worry: Not on file  . Food insecurity - inability: Not on file  . Transportation needs - medical: Not on file  . Transportation needs - non-medical: Not on file  Occupational History  . Occupation: Retired  Tobacco Use  . Smoking status: Current Every Day Smoker    Packs/day: 0.25    Years: 30.00    Pack years: 7.50    Types: Cigarettes  . Smokeless tobacco: Never Used  . Tobacco comment: currently smoking .25 ppd, trying to quit 02/13/16  Substance and Sexual Activity  . Alcohol use: No    Alcohol/week: 0.0 oz  . Drug use: No  . Sexual activity: No  Other Topics Concern  . Not on file  Social History Narrative   Lives in [place]. Disability because of back    Allergies  Allergen Reactions  . Morphine Anaphylaxis  . Prednisone Other (See Comments)    "makes me think I  am having a heart attack"  . Gabapentin Other (See Comments)    intolerance  . Nsaids Diarrhea  . Penicillins Swelling    Has patient had a PCN reaction causing immediate rash, facial/tongue/throat swelling, SOB or lightheadedness with hypotension: Yes Has patient had a PCN reaction causing severe rash involving mucus membranes or skin necrosis: No Has patient had a PCN reaction that required hospitalization: No Has patient had a PCN reaction occurring within the last 10 years: No If all of the above answers are "NO", then may proceed with Cephalosporin use.      Constitutional: Denies headache, fatigue, fever or abrupt weight changes.  HEENT:  Positive itchy ears, runny nose, nasal congestion. Denies eye redness, eye pain, pressure behind the eyes, facial pain, ear pain, ringing in the ears, wax buildup, or bloody  nose. Respiratory: Positive cough. Denies difficulty breathing or shortness of breath.  Cardiovascular: Denies chest pain, chest tightness, palpitations or swelling in the hands or feet.   No other specific complaints in a complete review of systems (except as listed in HPI above).  Objective:   BP (!) 144/84   Pulse 83   Temp 98.5 F (36.9 C) (Oral)   Wt 196 lb (88.9 kg)   SpO2 97%   BMI 38.28 kg/m  Wt Readings from Last 3 Encounters:  01/03/18 196 lb (88.9 kg)  11/29/17 197 lb (89.4 kg)  10/17/17 196 lb 2 oz (89 kg)     General: Appears her stated age, in NAD. HEENT: Head: normal shape and size, no sinus tenderness noted;Ears: Tm's gray and intact, normal light reflex; Nose: mucosa pink and moist, septum midline; Throat/Mouth: + PND. Teeth present, mucosa erythematous and moist, no exudate noted, no lesions or ulcerations noted.  Neck: No cervical lymphadenopathy.  Pulmonary/Chest: Normal effort and diminshed breath sounds. No respiratory distress. No wheezes, rales or ronchi noted.       Assessment & Plan:   Viral Upper Respiratory Infection with Cough:  Get some rest and drink plenty of water Start Flonase and Mucinex 80 mg Depo IM today eRx for Hycodan cough syrup  RTC as needed or if symptoms persist.   Webb Silversmith, NP

## 2018-01-04 MED ORDER — METHYLPREDNISOLONE ACETATE 80 MG/ML IJ SUSP
80.0000 mg | Freq: Once | INTRAMUSCULAR | Status: AC
Start: 2018-01-04 — End: 2018-01-03
  Administered 2018-01-03: 80 mg via INTRAMUSCULAR

## 2018-01-04 NOTE — Addendum Note (Signed)
Addended by: Lurlean Nanny on: 01/04/2018 04:56 PM   Modules accepted: Orders

## 2018-01-12 ENCOUNTER — Ambulatory Visit (INDEPENDENT_AMBULATORY_CARE_PROVIDER_SITE_OTHER): Payer: Medicare Other | Admitting: Family Medicine

## 2018-01-12 ENCOUNTER — Encounter: Payer: Self-pay | Admitting: Family Medicine

## 2018-01-12 VITALS — BP 160/72 | HR 81 | Temp 98.1°F | Resp 18 | Wt 193.0 lb

## 2018-01-12 DIAGNOSIS — I70212 Atherosclerosis of native arteries of extremities with intermittent claudication, left leg: Secondary | ICD-10-CM | POA: Diagnosis not present

## 2018-01-12 DIAGNOSIS — J209 Acute bronchitis, unspecified: Secondary | ICD-10-CM | POA: Diagnosis not present

## 2018-01-12 MED ORDER — DOXYCYCLINE HYCLATE 100 MG PO TABS: 100.0000 mg | ORAL_TABLET | Freq: Two times a day (BID) | ORAL | 0 refills | Status: DC

## 2018-01-12 NOTE — Patient Instructions (Signed)
Please take medication as directed and follow up if symptoms do not improve with treatment, worsen, or you develop a fever >101. It was a pleasure meeting you today!   Use the cough syrup at night.   During the day, Delsym and/or Mucinex is best.      Acute Bronchitis Bronchitis is inflammation of the airways that extend from the windpipe into the lungs (bronchi). The inflammation often causes mucus to develop. This leads to a cough, which is the most common symptom of bronchitis.  In acute bronchitis, the condition usually develops suddenly and goes away over time, usually in a couple weeks. Smoking, allergies, and asthma can make bronchitis worse. Repeated episodes of bronchitis may cause further lung problems.  CAUSES Acute bronchitis is most often caused by the same virus that causes a cold. The virus can spread from person to person (contagious) through coughing, sneezing, and touching contaminated objects. SIGNS AND SYMPTOMS   Cough.   Fever.   Coughing up mucus.   Body aches.   Chest congestion.   Chills.   Shortness of breath.   Sore throat.  DIAGNOSIS  Acute bronchitis is usually diagnosed through a physical exam. Your health care provider will also ask you questions about your medical history. Tests, such as chest X-rays, are sometimes done to rule out other conditions.  TREATMENT  Acute bronchitis usually goes away in a couple weeks. Oftentimes, no medical treatment is necessary. Medicines are sometimes given for relief of fever or cough. Antibiotic medicines are usually not needed but may be prescribed in certain situations. In some cases, an inhaler may be recommended to help reduce shortness of breath and control the cough. A cool mist vaporizer may also be used to help thin bronchial secretions and make it easier to clear the chest.  HOME CARE INSTRUCTIONS  Get plenty of rest.   Drink enough fluids to keep your urine clear or pale yellow (unless you  have a medical condition that requires fluid restriction). Increasing fluids may help thin your respiratory secretions (sputum) and reduce chest congestion, and it will prevent dehydration.   Take medicines only as directed by your health care provider.  If you were prescribed an antibiotic medicine, finish it all even if you start to feel better.  Avoid smoking and secondhand smoke. Exposure to cigarette smoke or irritating chemicals will make bronchitis worse. If you are a smoker, consider using nicotine gum or skin patches to help control withdrawal symptoms. Quitting smoking will help your lungs heal faster.   Reduce the chances of another bout of acute bronchitis by washing your hands frequently, avoiding people with cold symptoms, and trying not to touch your hands to your mouth, nose, or eyes.   Keep all follow-up visits as directed by your health care provider.  SEEK MEDICAL CARE IF: Your symptoms do not improve after 1 week of treatment.  SEEK IMMEDIATE MEDICAL CARE IF:  You develop an increased fever or chills.   You have chest pain.   You have severe shortness of breath.  You have bloody sputum.   You develop dehydration.  You faint or repeatedly feel like you are going to pass out.  You develop repeated vomiting.  You develop a severe headache. MAKE SURE YOU:   Understand these instructions.  Will watch your condition.  Will get help right away if you are not doing well or get worse.   This information is not intended to replace advice given to you by your health  care provider. Make sure you discuss any questions you have with your health care provider.   Document Released: 12/09/2004 Document Revised: 11/22/2014 Document Reviewed: 04/24/2013 Elsevier Interactive Patient Education [employer].

## 2018-01-12 NOTE — Progress Notes (Signed)
Patient ID: Andrea Stewart, female   DOB: 1946-02-05, 72 y.o.   MRN: [id number]  PCP: Crecencio Mc, MD  Subjective:  Andrea Stewart is a 72 y.o. year old very pleasant female patient who presents with  symptoms including nasal congestion, chest congestion, cough that is nonproductive -started: 10 days ago, symptoms are improved but have returned. -previous treatments: Mucinex has provided limited benefit. Tried acetaminophen, nasal saline, robitussin, flonase, and hycodan for cough. All have provided limited benefit. She also received 80 mg Depo IM on 01/03/18 which she reports provided limited benefit -sick contacts/travel/risks: denies flu exposure. Recent sick contact exposure of sick granddaughter -Hx of: COPD Current every day smoker. She smokes 6 to 7 cigarettes/day. No SOB above baseline today  ROS-denies fever, SOB, NVD, tooth pain  Pertinent Past Medical History- COPD, T2DM  Medications- reviewed  Current Outpatient Medications  Medication Sig Dispense Refill  . albuterol (PROVENTIL HFA) 108 (90 Base) MCG/ACT inhaler Inhale 2 puffs every 4 (four) hours as needed into the lungs for wheezing or shortness of breath. 1 Inhaler 2  . amLODipine (NORVASC) 10 MG tablet TAKE 1 TABLET BY MOUTH EVERY DAY. 90 tablet 1  . aspirin 81 MG tablet Take 1 tablet (81 mg total) by mouth daily.    Marland Kitchen atorvastatin (LIPITOR) 20 MG tablet TAKE 1 TABLET BY MOUTH EVERY DAY 90 tablet 0  . bisacodyl (DULCOLAX) 10 MG suppository Place 1 suppository (10 mg total) rectally as needed for moderate constipation. 12 suppository 0  . budesonide-formoterol (SYMBICORT) 160-4.5 MCG/ACT inhaler Inhale 2 puffs into the lungs 2 (two) times daily. 1 Inhaler 5  . calcium carbonate (OS-CAL) 600 MG TABS Take 600 mg by mouth daily.      . carvedilol (COREG) 6.25 MG tablet Take 1 tablet (6.25 mg total) by mouth 2 (two) times daily with a meal. (Patient taking differently: Take 6.25 mg by mouth daily. ) 60 tablet 3  .  chlorpheniramine-HYDROcodone (TUSSIONEX PENNKINETIC ER) 10-8 MG/5ML SUER Take 5 mLs by mouth at bedtime as needed. 140 mL 0  . co-enzyme Q-10 30 MG capsule Take 30 mg by mouth daily.    Marland Kitchen DAILY MULTIPLE VITAMINS PO Take 1 tablet by mouth daily.      . fluticasone (VERAMYST) 27.5 MCG/SPRAY nasal spray Place 2 sprays into the nose as needed for rhinitis or allergies.     Marland Kitchen HYDROcodone-homatropine (HYCODAN) 5-1.5 MG/5ML syrup Take 5 mLs by mouth every 8 (eight) hours as needed for cough. 120 mL 0  . insulin lispro (HUMALOG) 100 UNIT/ML injection Inject into the skin as directed. Sliding scale 100-150 = 2-2.5 units 150-200 = 3-3.5 units 200-250 = 4-4.5 units 251-300 = 5-5.5 units 301-350 = 6-6.5 units 350-400 = 7-7.5 units    . insulin NPH (HUMULIN N,NOVOLIN N) 100 UNIT/ML injection Inject 22 Units into the skin daily before breakfast. 24 units at bedtime    . irbesartan (AVAPRO) 150 MG tablet Take 150 mg by mouth 2 (two) times daily.     . metolazone (ZAROXOLYN) 2.5 MG tablet TAKE 1 TABLET BY MOUTH DAILY 30 MINUTES PRIOR TO TORSEMIDE 30 tablet 2  . mometasone (NASONEX) 50 MCG/ACT nasal spray Place 2 sprays into the nose daily as needed.     . nitroGLYCERIN (NITROSTAT) 0.4 MG SL tablet Place 1 tablet (0.4 mg total) under the tongue every 5 (five) minutes as needed. Maximum 3 tablets (Patient taking differently: Place 0.4 mg under the tongue every 5 (five) minutes as needed for  chest pain. Maximum 3 tablets) 25 tablet 6  . sodium chloride (OCEAN) 0.65 % SOLN nasal spray Place 2 sprays into both nostrils as needed for congestion.    Marland Kitchen Spacer/Aero-Holding Chambers (AEROCHAMBER MV) inhaler Use as instructed 1 each 0  . torsemide (DEMADEX) 10 MG tablet TAKE ONE-HALF TABLET BY MOUTH DAILY 15 tablet 2   Current Facility-Administered Medications  Medication Dose Route Frequency Provider Last Rate Last Dose  . albuterol (PROVENTIL HFA;VENTOLIN HFA) 108 (90 Base) MCG/ACT inhaler 2 puff  2 puff Inhalation  Q4H PRN Byrnett, Forest Gleason, MD      . lactated ringers infusion   Intravenous Continuous Bary Castilla, Forest Gleason, MD      . ondansetron (ZOFRAN) 4 mg in sodium chloride 0.9 % 50 mL IVPB  4 mg Intravenous Q4H PRN Byrnett, Forest Gleason, MD      . traMADol Veatrice Bourbon) tablet 50 mg  50 mg Oral Q4H PRN Robert Bellow, MD        Objective: BP (!) 160/72 (BP Location: Left Arm, Patient Position: Sitting, Cuff Size: Large)   Pulse 81   Temp 98.1 F (36.7 C) (Oral)   Resp 18   Wt 198 lb 2 oz (89.9 kg)   SpO2 94%   BMI 38.69 kg/m  Gen: NAD, resting comfortably HEENT: Turbinates erythematous, TMs normal bilaterally, pharynx mildly erythematous with no tonsilar exudate or edema, no sinus tenderness CV: RRR no murmurs rubs or gallops Lungs: Minimal scattered wheezing, no crackles or rhonchi Abdomen: soft/nontender/nondistended/normal bowel sounds. No rebound or guarding.  Ext: no edema Skin: warm, dry, no rash Neuro: grossly normal, moves all extremities  Assessment/Plan: 1. Acute bronchitis, unspecified organism Symptom duration and double sickening appearance; history of T2DM and COPD support treatment with symptoms most consistent with bronchitis. Discussed likely viral nature and less likely bacterial cause. No crackles/rhonchi present, VSS make pneumonia unlikely. Discussed possible treatment options and a prescription for doxycycline was provided. She declines oral prednisone due to previous adverse reaction. Advised use of antibiotic if symptoms have not improved in in the next 2-3 days. Advised that if her symptoms were to worsen she should let Andrea Stewart know. She has a cough suppressant as well. She is given return precautions.   - doxycycline (VIBRA-TABS) 100 MG tablet; Take 1 tablet (100 mg total) by mouth 2 (two) times daily.  Dispense: 20 tablet; Refill: 0  Finally, we reviewed reasons to return to care including if symptoms worsen or persist or new concerns arise- once again particularly  shortness of breath or fever.    Laurita Quint, FNP

## 2018-01-28 NOTE — Progress Notes (Signed)
Cardiology Office Note  Date:  01/31/2018   ID:  Andrea Stewart, DOB December 11, 1945, MRN [id number]  PCP:  Andrea Mc, MD   Chief Complaint  Patient presents with  . Other    12 month follow up. Patient c/o SOB from COPD.  Meds reviewed verbally with patient.     HPI:  Andrea Stewart is a 72 year old woman with history of  obesity,  COPD, smoking poorly controlled diabetes, followed by Andrea Stewart,  HBA1C 9.4 prior smoking history , Cardiac catheterization by report more than 7 years ago that showed no significant CAD. 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  LE arterial disease, followed by Andrea Stewart, ABI 0.8  who presents for routine followup of her shortness of breath, hypertension.  Quit smoking 3 days, now smoking again  will try to quit again Difficulty losing weight, no regular exercise program Walking with a cane, no recent falls  Had hernia surgery October 2018 She reports they only used regular insulin, sugars ran high Glu >500, got it down at home over the next 24 hours  Previously seen by Andrea Stewart for chronic hip pain Denies any chest pain on exertion  Chronic renal dysfunction Taking very low-dose torsemide  history of leg cramping Very minimal leg swelling  EKG personally reviewed by myself on todays visit Shows normal sinus rhythm with rate 71 bpm no significant ST or T-wave changes  Other past medical history reviewed Previously required prednisone shot for COPD exacerbation, shortness of breath stress at home, daughter with cancer with metastases   bronchitis at the end of December 2015 with antibiotics. For shortness of breath, she was treated with Lasix 40 mg daily. Creatinine climbed up to 1.4 with elevated BUN.  History of leg cramping, She takes tonic water, mustard for relief.  Cardiac catheterization by report more than 7 years ago that showed no significant CAD. She was previously seen by Andrea Stewart.  She  does have diffuse back, hip, knee, foot arthritis which limits her ability to exercise.  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 Limited secondary to her arthritis conditions.  She is unable to tolerate amlodipine secondary to leg swelling, Diovan HCT (leg cramping) losartan (patient self discontinued after no BP response in a weekend - though it would take longer to see a response), valsartan (uncertain reason),  and clonidine (fatigue).    PMH:   has a past medical history of Backache, unspecified, Degeneration of intervertebral disc, site unspecified, Depressive disorder, not elsewhere classified, Disorder of bone and cartilage, unspecified, HTN (hypertension), left heart catheterization by cutdown, Kidney failure, Myalgia and myositis, unspecified, Obesity, unspecified, Personal history of tobacco use, presenting hazards to health, Plantar fascial fibromatosis, S/P cholecystectomy, S/p nephrectomy, Type II or unspecified type diabetes mellitus without mention of complication, not stated as uncontrolled, Unspecified essential hypertension, and Unspecified hereditary and idiopathic peripheral neuropathy.  PSH:    Past Surgical History:  Procedure Laterality Date  . ABDOMINAL HYSTERECTOMY    . APPENDECTOMY    . BACK SURGERY     2006  . CARDIAC CATHETERIZATION     o.k. 2003  . CARPAL TUNNEL RELEASE    . CERVICAL DISCECTOMY    . CHOLECYSTECTOMY    . COLONOSCOPY  09/1999   colonoscopy-hemorrhoids, re-check 5 years (10/2006)  . dexa     osteopenia (01/2004)  . ELBOW SURGERY    . FOOT SURGERY     x 3  . KIDNEY DONATION  1991  . LUMBAR FUSION    . NECK SURGERY     06/2005  . Problem with general Anesthesia     02/2006  . REPLACEMENT TOTAL KNEE  10/2004  . trigger finger surgery    . VENTRAL HERNIA REPAIR N/A 09/08/2017   Primary repair of a 10 x 15 mm infraumbilical fascial defect.  Surgeon: Andrea Bellow, MD;  Location: [facility] ORS;  Service: General;  Laterality: N/A;  .  VESICOVAGINAL FISTULA CLOSURE W/ TAH    . VIDEO BRONCHOSCOPY Bilateral 04/02/2015   Procedure: VIDEO BRONCHOSCOPY WITHOUT FLUORO;  Surgeon: Juanito Doom, MD;  Location: [facility];  Service: Cardiopulmonary;  Laterality: Bilateral;    Current Outpatient Medications  Medication Sig Dispense Refill  . albuterol (PROVENTIL HFA) 108 (90 Base) MCG/ACT inhaler Inhale 2 puffs every 4 (four) hours as needed into the lungs for wheezing or shortness of breath. 1 Inhaler 2  . amLODipine (NORVASC) 10 MG tablet TAKE 1 TABLET BY MOUTH EVERY DAY. 90 tablet 1  . aspirin 81 MG tablet Take 1 tablet (81 mg total) by mouth daily.    Marland Kitchen atorvastatin (LIPITOR) 20 MG tablet TAKE 1 TABLET BY MOUTH EVERY DAY 90 tablet 0  . budesonide-formoterol (SYMBICORT) 160-4.5 MCG/ACT inhaler Inhale 2 puffs into the lungs 2 (two) times daily. 1 Inhaler 5  . calcium carbonate (OS-CAL) 600 MG TABS Take 600 mg by mouth daily.      . carvedilol (COREG) 6.25 MG tablet Take 6.25 mg by mouth daily.    Marland Kitchen co-enzyme Q-10 30 MG capsule Take 30 mg by mouth daily.    Marland Kitchen DAILY MULTIPLE VITAMINS PO Take 1 tablet by mouth daily.      . fluticasone (VERAMYST) 27.5 MCG/SPRAY nasal spray Place 2 sprays into the nose as needed for rhinitis or allergies.     Marland Kitchen insulin lispro (HUMALOG) 100 UNIT/ML injection Inject into the skin as directed. Sliding scale 100-150 = 2-2.5 units 150-200 = 3-3.5 units 200-250 = 4-4.5 units 251-300 = 5-5.5 units 301-350 = 6-6.5 units 350-400 = 7-7.5 units    . insulin NPH (HUMULIN N,NOVOLIN N) 100 UNIT/ML injection Inject 22 Units into the skin daily before breakfast. 24 units at bedtime    . irbesartan (AVAPRO) 150 MG tablet Take 150 mg by mouth 2 (two) times daily.     . metolazone (ZAROXOLYN) 2.5 MG tablet TAKE 1 TABLET BY MOUTH DAILY 30 MINUTES PRIOR TO TORSEMIDE 30 tablet 2  . mometasone (NASONEX) 50 MCG/ACT nasal spray Place 2 sprays into the nose daily as needed.     . nitroGLYCERIN (NITROSTAT) 0.4 MG SL  tablet Place 1 tablet (0.4 mg total) under the tongue every 5 (five) minutes as needed. Maximum 3 tablets (Patient taking differently: Place 0.4 mg under the tongue every 5 (five) minutes as needed for chest pain. Maximum 3 tablets) 25 tablet 6  . sodium chloride (OCEAN) 0.65 % SOLN nasal spray Place 2 sprays into both nostrils as needed for congestion.    Marland Kitchen Spacer/Aero-Holding Chambers (AEROCHAMBER MV) inhaler Use as instructed 1 each 0  . torsemide (DEMADEX) 10 MG tablet TAKE ONE-HALF TABLET BY MOUTH DAILY 15 tablet 2   Current Facility-Administered Medications  Medication Dose Route Frequency Provider Last Rate Last Dose  . albuterol (PROVENTIL HFA;VENTOLIN HFA) 108 (90 Base) MCG/ACT inhaler 2 puff  2 puff Inhalation Q4H PRN Byrnett, Forest Gleason, MD      . lactated ringers infusion   Intravenous Continuous Byrnett, Forest Gleason, MD      .  ondansetron (ZOFRAN) 4 mg in sodium chloride 0.9 % 50 mL IVPB  4 mg Intravenous Q4H PRN Andrea Bellow, MD      . traMADol Veatrice Bourbon) tablet 50 mg  50 mg Oral Q4H PRN Andrea Bellow, MD         Allergies:   Morphine; Prednisone; Gabapentin; Nsaids; and Penicillins   Social History:  The patient  reports that she has been smoking cigarettes.  She has a 7.50 pack-year smoking history. she has never used smokeless tobacco. She reports that she does not drink alcohol or use drugs.   Family History:   family history includes COPD in her father; Cancer in her daughter, father, and mother; Colon cancer in her mother; Coronary artery disease in her mother; Diabetes in her mother, paternal aunt, and paternal uncle; Heart attack (age of onset: 49) in her brother; Hypertension in her mother; Prostate cancer in her father; Sleep apnea in her brother.    Review of Systems: Review of Systems  Constitutional: Negative.   Respiratory: Negative.   Cardiovascular: Negative.   Gastrointestinal: Negative.   Musculoskeletal: Positive for joint pain.       Diffuse leg  pain  Neurological: Negative.   Psychiatric/Behavioral: Negative.   All other systems reviewed and are negative.    PHYSICAL EXAM: VS:  BP (!) 148/70 (BP Location: Left Arm, Patient Position: Sitting, Cuff Size: Normal)   Pulse 71   Ht 5\' 1"  (1.549 m)   Wt 195 lb (88.5 kg)   BMI 36.84 kg/m  , BMI Body mass index is 36.84 kg/m. Constitutional:  oriented to person, place, and time. No distress.  HENT:  Head: Normocephalic and atraumatic.  Eyes:  no discharge. No scleral icterus.  Neck: Normal range of motion. Neck supple. No JVD present.  Cardiovascular: Normal rate, regular rhythm, normal heart sounds and intact distal pulses. Exam reveals no gallop and no friction rub. No edema No murmur heard. Pulmonary/Chest: Effort normal and breath sounds normal. No stridor. No respiratory distress.  no wheezes.  no rales.  no tenderness.  Abdominal: Soft.  no distension.  no tenderness.  Musculoskeletal: Normal range of motion.  no  tenderness or deformity.  Neurological:  normal muscle tone. Coordination normal. No atrophy Skin: Skin is warm and dry. No rash noted. not diaphoretic.  Psychiatric:  normal mood and affect. behavior is normal. Thought content normal.     Recent Labs: 07/22/2017: ALT 16 08/25/2017: TSH 3.04 09/07/2017: Hemoglobin 12.8; Platelets 238 09/21/2017: BUN 25; Creatinine 2.0; Potassium 4.4; Sodium 141    Lipid Panel Lab Results  Component Value Date   CHOL 136 08/25/2017   HDL 60.30 08/25/2017   LDLCALC 53 08/25/2017   TRIG 111.0 08/25/2017      Wt Readings from Last 3 Encounters:  01/31/18 195 lb (88.5 kg)  01/12/18 193 lb (87.5 kg)  01/03/18 196 lb (88.9 kg)       ASSESSMENT AND PLAN:  Coronary atherosclerosis due to lipid rich plaque - Plan: EKG 12-Lead Currently with no symptoms of angina. No further workup at this time. Continue current medication regimen.  Stable  Essential hypertension Blood pressure is well controlled on today's visit. No  changes made to the medications. Stable  Uncontrolled type 2 diabetes mellitus with complication, unspecified long term insulin use status (HCC) Poor diet, unable to exercise, weight continues to be a problem On 2 kinds of insulin managed by endocrinology  COPD (chronic obstructive pulmonary disease) with chronic bronchitis ([place]) Long history  of smoking, Still smoking, trying to quit, Discussed various modalities, more than 5 minutes spent discussing various techniques she could try  Tobacco use disorder We have encouraged her to continue to work on weaning her cigarettes and smoking cessation. She will continue to work on this and does not want any assistance with chantix.  As above discussed various other modalities that she could try  CKD (chronic kidney disease), stage III Followed by nephrology Taking torsemide every other day    Total encounter time more than 25 minutes  Greater than 50% was spent in counseling and coordination of care with the patient  Disposition:   F/U  12 months   Orders Placed This Encounter  Procedures  . EKG 12-Lead     Signed, Esmond Plants, M.D., Ph.D. 01/31/2018  [place], [place]

## 2018-01-31 ENCOUNTER — Ambulatory Visit (INDEPENDENT_AMBULATORY_CARE_PROVIDER_SITE_OTHER): Payer: Medicare Other | Admitting: Cardiovascular Disease

## 2018-01-31 VITALS — BP 148/70 | HR 71 | Ht 61.0 in | Wt 195.0 lb

## 2018-01-31 DIAGNOSIS — N183 Chronic kidney disease, stage 3 unspecified: Secondary | ICD-10-CM

## 2018-01-31 DIAGNOSIS — I739 Peripheral vascular disease, unspecified: Secondary | ICD-10-CM

## 2018-01-31 DIAGNOSIS — I70212 Atherosclerosis of native arteries of extremities with intermittent claudication, left leg: Secondary | ICD-10-CM | POA: Diagnosis not present

## 2018-01-31 DIAGNOSIS — I10 Essential (primary) hypertension: Secondary | ICD-10-CM | POA: Diagnosis not present

## 2018-01-31 DIAGNOSIS — E782 Mixed hyperlipidemia: Secondary | ICD-10-CM

## 2018-01-31 DIAGNOSIS — Z720 Tobacco use: Secondary | ICD-10-CM

## 2018-01-31 DIAGNOSIS — J4489 Other specified chronic obstructive pulmonary disease: Secondary | ICD-10-CM

## 2018-01-31 DIAGNOSIS — J449 Chronic obstructive pulmonary disease, unspecified: Secondary | ICD-10-CM

## 2018-01-31 NOTE — Patient Instructions (Signed)

## 2018-02-07 DIAGNOSIS — M47816 Spondylosis without myelopathy or radiculopathy, lumbar region: Secondary | ICD-10-CM | POA: Diagnosis not present

## 2018-02-16 ENCOUNTER — Ambulatory Visit
Admission: RE | Admit: 2018-02-16 | Discharge: 2018-02-16 | Disposition: A | Discharge status: Home / Self Care | Payer: Medicare Other | Source: Ambulatory Visit | Attending: Internal Medicine | Admitting: Internal Medicine

## 2018-02-16 DIAGNOSIS — Z1231 Encounter for screening mammogram for malignant neoplasm of breast: Secondary | ICD-10-CM | POA: Insufficient documentation

## 2018-02-20 ENCOUNTER — Other Ambulatory Visit: Payer: Self-pay | Admitting: Internal Medicine

## 2018-02-27 ENCOUNTER — Telehealth: Payer: Self-pay | Admitting: Internal Medicine

## 2018-02-27 ENCOUNTER — Other Ambulatory Visit: Payer: Self-pay | Admitting: Internal Medicine

## 2018-02-27 ENCOUNTER — Other Ambulatory Visit: Payer: Self-pay | Admitting: *Deleted

## 2018-02-27 MED ORDER — CARVEDILOL 6.25 MG PO TABS: 6.2500 mg | ORAL_TABLET | Freq: Every day | ORAL | 1 refills | Status: DC

## 2018-02-27 NOTE — Telephone Encounter (Signed)
Call to patient - she has 2 prescriptions at the pharmacy for this medication- she is taking 1 daily. Call to pharmacy- they will request the current dosing.

## 2018-02-27 NOTE — Telephone Encounter (Signed)
Refill sent as requested. 

## 2018-02-27 NOTE — Telephone Encounter (Signed)
Patient said she wants to know why her medicine was denied due to " discontinued " for Carvedilol 6.25 MG  [phone] or [phone]

## 2018-03-01 ENCOUNTER — Other Ambulatory Visit: Payer: Self-pay | Admitting: Internal Medicine

## 2018-03-01 NOTE — Telephone Encounter (Signed)
Ok to refill as once daily if that is how she has been taking it.   Done!

## 2018-03-01 NOTE — Telephone Encounter (Signed)
Pharmacy is stating that pt only takes medication once a day. Is it okay to send in a new rx stating take once daily?

## 2018-03-07 ENCOUNTER — Other Ambulatory Visit: Payer: Self-pay | Admitting: Internal Medicine

## 2018-03-23 ENCOUNTER — Other Ambulatory Visit: Payer: Self-pay | Admitting: Internal Medicine

## 2018-03-25 ENCOUNTER — Telehealth: Payer: Self-pay | Admitting: *Deleted

## 2018-03-25 DIAGNOSIS — Z122 Encounter for screening for malignant neoplasm of respiratory organs: Secondary | ICD-10-CM

## 2018-03-25 DIAGNOSIS — Z87891 Personal history of nicotine dependence: Secondary | ICD-10-CM

## 2018-03-25 NOTE — Telephone Encounter (Signed)
Notified patient that annual lung cancer screening low dose CT scan is due currently or will be in near future. Confirmed that patient is within the age range of 55-77, and asymptomatic, (no signs or symptoms of lung cancer). Patient denies illness that would prevent curative treatment for lung cancer if found. Verified smoking history, (current, 31.5 pack year). The shared decision making visit was done 01/02/16. Patient is agreeable for CT scan being scheduled 2nd week of June.

## 2018-03-28 DIAGNOSIS — G609 Hereditary and idiopathic neuropathy, unspecified: Secondary | ICD-10-CM | POA: Diagnosis not present

## 2018-03-28 DIAGNOSIS — R809 Proteinuria, unspecified: Secondary | ICD-10-CM | POA: Diagnosis not present

## 2018-03-28 DIAGNOSIS — I10 Essential (primary) hypertension: Secondary | ICD-10-CM | POA: Diagnosis not present

## 2018-03-28 DIAGNOSIS — E7800 Pure hypercholesterolemia, unspecified: Secondary | ICD-10-CM | POA: Diagnosis not present

## 2018-03-28 DIAGNOSIS — E1165 Type 2 diabetes mellitus with hyperglycemia: Secondary | ICD-10-CM | POA: Diagnosis not present

## 2018-04-03 DIAGNOSIS — I129 Hypertensive chronic kidney disease with stage 1 through stage 4 chronic kidney disease, or unspecified chronic kidney disease: Secondary | ICD-10-CM | POA: Diagnosis not present

## 2018-04-03 DIAGNOSIS — N184 Chronic kidney disease, stage 4 (severe): Secondary | ICD-10-CM | POA: Diagnosis not present

## 2018-04-03 DIAGNOSIS — R809 Proteinuria, unspecified: Secondary | ICD-10-CM | POA: Diagnosis not present

## 2018-04-03 DIAGNOSIS — N189 Chronic kidney disease, unspecified: Secondary | ICD-10-CM | POA: Diagnosis not present

## 2018-04-03 DIAGNOSIS — E1122 Type 2 diabetes mellitus with diabetic chronic kidney disease: Secondary | ICD-10-CM | POA: Diagnosis not present

## 2018-04-04 ENCOUNTER — Ambulatory Visit (INDEPENDENT_AMBULATORY_CARE_PROVIDER_SITE_OTHER): Payer: Medicare Other | Admitting: Internal Medicine

## 2018-04-04 ENCOUNTER — Encounter: Payer: Self-pay | Admitting: Internal Medicine

## 2018-04-04 VITALS — BP 160/84 | HR 67 | Temp 99.1°F | Resp 16 | Ht 61.0 in | Wt 195.0 lb

## 2018-04-04 DIAGNOSIS — I70212 Atherosclerosis of native arteries of extremities with intermittent claudication, left leg: Secondary | ICD-10-CM | POA: Diagnosis not present

## 2018-04-04 DIAGNOSIS — R229 Localized swelling, mass and lump, unspecified: Secondary | ICD-10-CM | POA: Diagnosis not present

## 2018-04-04 DIAGNOSIS — I2510 Atherosclerotic heart disease of native coronary artery without angina pectoris: Secondary | ICD-10-CM | POA: Diagnosis not present

## 2018-04-04 DIAGNOSIS — Z1211 Encounter for screening for malignant neoplasm of colon: Secondary | ICD-10-CM | POA: Diagnosis not present

## 2018-04-04 DIAGNOSIS — I10 Essential (primary) hypertension: Secondary | ICD-10-CM | POA: Diagnosis not present

## 2018-04-04 DIAGNOSIS — Z800 Family history of malignant neoplasm of digestive organs: Secondary | ICD-10-CM

## 2018-04-04 DIAGNOSIS — N183 Chronic kidney disease, stage 3 unspecified: Secondary | ICD-10-CM

## 2018-04-04 DIAGNOSIS — I2583 Coronary atherosclerosis due to lipid rich plaque: Secondary | ICD-10-CM

## 2018-04-04 LAB — CBC AND DIFFERENTIAL
HCT: 36 (ref 36–46)
HEMOGLOBIN: 12 (ref 12.0–16.0)
NEUTROS ABS: 3951
PLATELETS: 219 (ref 150–399)
WBC: 6.8

## 2018-04-04 LAB — BASIC METABOLIC PANEL
BUN: 36 — AB (ref 4–21)
Creatinine: 2.1 — AB (ref 0.5–1.1)
Glucose: 105
Potassium: 4.2 (ref 3.4–5.3)
Sodium: 142 (ref 137–147)

## 2018-04-04 LAB — MICROALBUMIN, URINE

## 2018-04-04 MED ORDER — TORSEMIDE 5 MG PO TABS: 5.0000 mg | ORAL_TABLET | Freq: Every day | ORAL | 1 refills | Status: DC

## 2018-04-04 MED ORDER — METOLAZONE 2.5 MG PO TABS: ORAL_TABLET | ORAL | 1 refills | Status: DC

## 2018-04-04 NOTE — Patient Instructions (Addendum)
your blood pressure is consistently above the currently recommended acceptable goal  of 130/80 when you come to the office.  This may be "white coat hypertension."     Please have Joelene Millin check your readings  Several times over the next 2 weeks and send me the readings   Please keep trying to cut down on the cigarettes    referral to Dr Evorn Gong to check out your skin condition

## 2018-04-04 NOTE — Progress Notes (Signed)
Subjective:  Patient ID: Andrea Stewart, female    DOB: Mar 18, 1946  Age: 72 y.o. MRN: [id number]  CC: The primary encounter diagnosis was Multiple skin nodules. Diagnoses of Family history of colon cancer requiring screening colonoscopy, Essential hypertension, Coronary atherosclerosis due to lipid rich plaque, CKD (chronic kidney disease), stage III ([place]), and Colon cancer screening were also pertinent to this visit.  HPI Andrea Stewart presents for 6 month follow up on chronic issues includinng CAD, COPD CKD and  tobacco abuse   Cc:  Recurrent skin nodules  since February , states that the nodules begin as raised papules, very pruritic ,  With  central core but no punctum and do not behave a pimples .  Has been picking at them to "dig out th core." and they have healed with considerable scarring.  Occurring on shoulders and  arms  . Has been spraying them  with antifungal powder at the suggestion of her podiatrist and they seem to resolve .   Treated late feb for bronchitis.  Still smoking.  Symptoms resolved.    Saw Dr  Carloyn Manner [facility]) in  March fo  rchronic low back pain  , and sees  DR Manuella Ghazi for tremor and diabetic neuropathy  Last seen by Manuella Ghazi  in 2017   Hypertension;  Improved control , Edema improved with zaroxolyn and torsemide .  Type 2 DM managed by Dr Chalmers Cater,  Last A1c reportedly improved at  9.1   wearing Freestyle Libre monitor for diabetes management  Using  Mixed insulin, 22 in am and 25 at night , and sliding scale at lunch   Was referred but deferred colonoscopy due to irritation over something that was done at her husband's last colonoscopy (per patient a metal marker/stent was placed that now precludes him from ever having an MRI)   Reminded her that procrastinating only hurts herself,  Discussed alternate referral to Dr Vicente Males at [facility] normal April 2019  Outpatient Medications Prior to Visit  Medication Sig Dispense Refill  . albuterol  (PROVENTIL HFA) 108 (90 Base) MCG/ACT inhaler Inhale 2 puffs every 4 (four) hours as needed into the lungs for wheezing or shortness of breath. 1 Inhaler 2  . amLODipine (NORVASC) 10 MG tablet TAKE 1 TABLET BY MOUTH EVERY DAY 90 tablet 1  . aspirin 81 MG tablet Take 1 tablet (81 mg total) by mouth daily.    Marland Kitchen atorvastatin (LIPITOR) 20 MG tablet TAKE 1 TABLET BY MOUTH EVERY DAY 90 tablet 1  . budesonide-formoterol (SYMBICORT) 160-4.5 MCG/ACT inhaler Inhale 2 puffs into the lungs 2 (two) times daily. 1 Inhaler 5  . calcium carbonate (OS-CAL) 600 MG TABS Take 600 mg by mouth daily.      . carvedilol (COREG) 6.25 MG tablet Take 1 tablet (6.25 mg total) by mouth daily. 90 tablet 1  . co-enzyme Q-10 30 MG capsule Take 30 mg by mouth daily.    Marland Kitchen DAILY MULTIPLE VITAMINS PO Take 1 tablet by mouth daily.      . fluticasone (VERAMYST) 27.5 MCG/SPRAY nasal spray Place 2 sprays into the nose as needed for rhinitis or allergies.     Marland Kitchen insulin lispro (HUMALOG) 100 UNIT/ML injection Inject into the skin as directed. Sliding scale 100-150 = 2-2.5 units 150-200 = 3-3.5 units 200-250 = 4-4.5 units 251-300 = 5-5.5 units 301-350 = 6-6.5 units 350-400 = 7-7.5 units    . insulin NPH (HUMULIN N,NOVOLIN N) 100 UNIT/ML injection Inject 22 Units  into the skin daily before breakfast. 24 units at bedtime    . irbesartan (AVAPRO) 150 MG tablet Take 150 mg by mouth 2 (two) times daily.     . mometasone (NASONEX) 50 MCG/ACT nasal spray Place 2 sprays into the nose daily as needed.     . nitroGLYCERIN (NITROSTAT) 0.4 MG SL tablet Place 1 tablet (0.4 mg total) under the tongue every 5 (five) minutes as needed. Maximum 3 tablets (Patient taking differently: Place 0.4 mg under the tongue every 5 (five) minutes as needed for chest pain. Maximum 3 tablets) 25 tablet 6  . sodium chloride (OCEAN) 0.65 % SOLN nasal spray Place 2 sprays into both nostrils as needed for congestion.    Marland Kitchen Spacer/Aero-Holding Chambers (AEROCHAMBER MV)  inhaler Use as instructed 1 each 0  . metolazone (ZAROXOLYN) 2.5 MG tablet TAKE 1 TABLET BY MOUTH DAILY 30 MINUTES PRIOR TO TORSEMIDE 30 tablet 2  . torsemide (DEMADEX) 10 MG tablet TAKE ONE-HALF TABLET BY MOUTH DAILY 15 tablet 2  . carvedilol (COREG) 6.25 MG tablet Take 1 tablet (6.25 mg total) by mouth daily. (Patient not taking: Reported on 04/04/2018) 30 tablet 1   Facility-Administered Medications Prior to Visit  Medication Dose Route Frequency Provider Last Rate Last Dose  . albuterol (PROVENTIL HFA;VENTOLIN HFA) 108 (90 Base) MCG/ACT inhaler 2 puff  2 puff Inhalation Q4H PRN Byrnett, Forest Gleason, MD      . lactated ringers infusion   Intravenous Continuous Bary Castilla, Forest Gleason, MD      . ondansetron [facility]) 4 mg in sodium chloride 0.9 % 50 mL IVPB  4 mg Intravenous Q4H PRN Robert Bellow, MD      . traMADol Veatrice Bourbon) tablet 50 mg  50 mg Oral Q4H PRN Robert Bellow, MD        Review of Systems;  Patient denies headache, fevers, malaise, unintentional weight loss, skin rash, eye pain, sinus congestion and sinus pain, sore throat, dysphagia,  hemoptysis , cough, dyspnea, wheezing, chest pain, palpitations, orthopnea, edema, abdominal pain, nausea, melena, diarrhea, constipation, flank pain, dysuria, hematuria, urinary  Frequency, nocturia, numbness, tingling, seizures,  Focal weakness, Loss of consciousness,  Tremor, insomnia, depression, anxiety, and suicidal ideation.      Objective:  BP (!) 160/84 (BP Location: Left Arm, Patient Position: Sitting, Cuff Size: Large)   Pulse 67   Temp 99.1 F (37.3 C) (Oral)   Resp 16   Ht 5\' 1"  (1.549 m)   Wt 195 lb (88.5 kg)   SpO2 93%   BMI 36.84 kg/m   BP Readings from Last 3 Encounters:  04/04/18 (!) 160/84  01/31/18 (!) 148/70  01/12/18 (!) 160/72    Wt Readings from Last 3 Encounters:  04/04/18 195 lb (88.5 kg)  01/31/18 195 lb (88.5 kg)  01/12/18 193 lb (87.5 kg)    General appearance: alert, cooperative and appears stated  age Ears: normal TM's and external ear canals both ears Throat: lips, mucosa, and tongue normal; teeth and gums normal Neck: no adenopathy, no carotid bruit, supple, symmetrical, trachea midline and thyroid not enlarged, symmetric, no tenderness/mass/nodules Back: symmetric, no curvature. ROM normal. No CVA tenderness. Lungs: clear to auscultation bilaterally Heart: regular rate and rhythm, S1, S2 normal, no murmur, click, rub or gallop Abdomen: soft, non-tender; bowel sounds normal; no masses,  no organomegaly Pulses: 2+ and symmetric Skin: Skin color, texture, turgor normal. No rashes or lesions Lymph nodes: Cervical, supraclavicular, and axillary nodes normal.  Lab Results  Component Value Date  HGBA1C 9.4 (H) 09/07/2017   HGBA1C 9.7 07/25/2017   HGBA1C 8.9 (A) 01/01/2014    Lab Results  Component Value Date   CREATININE 2.0 (A) 09/21/2017   CREATININE 1.88 (H) 09/08/2017   CREATININE 2.37 (H) 09/07/2017    Lab Results  Component Value Date   WBC 8.2 09/07/2017   HGB 12.8 09/07/2017   HCT 37.8 09/07/2017   PLT 238 09/07/2017   GLUCOSE 141 (H) 09/08/2017   CHOL 136 08/25/2017   TRIG 111.0 08/25/2017   HDL 60.30 08/25/2017   LDLDIRECT 51.9 09/07/2012   LDLCALC 53 08/25/2017   ALT 16 07/22/2017   AST 18 07/22/2017   NA 141 09/21/2017   K 4.4 09/21/2017   CL 105 09/08/2017   CREATININE 2.0 (A) 09/21/2017   BUN 25 (A) 09/21/2017   CO2 28 09/08/2017   TSH 3.04 08/25/2017   INR 1.01 03/27/2015   HGBA1C 9.4 (H) 09/07/2017   MICROALBUR 742.5 01/03/2017    Mm Screening Breast Tomo Bilateral  Result Date: 02/16/2018 CLINICAL DATA:  Screening. EXAM: DIGITAL SCREENING BILATERAL MAMMOGRAM WITH TOMO AND CAD COMPARISON:  Previous exam(s). ACR Breast Density Category b: There are scattered areas of fibroglandular density. FINDINGS: There are no findings suspicious for malignancy. Images were processed with CAD. IMPRESSION: No mammographic evidence of malignancy. A result  letter of this screening mammogram will be mailed directly to the patient. RECOMMENDATION: Screening mammogram in one year. (Code:SM-B-01Y) BI-RADS CATEGORY  1: Negative. Electronically Signed   By: Fidela Salisbury M.D.   On: 02/16/2018 16:47    Assessment & Plan:   Problem List Items Addressed This Visit    Family history of colon cancer requiring screening colonoscopy    Referral to [facility] made . Last colonoscop was in 2013 and 5 yr follow up was due last year but deferred by patient       Relevant Orders   Ambulatory referral to Gastroenterology   Essential hypertension    Not at goal on irbesartan 150 mg, amlodipine 10 mg   and carvedilol. 6.25 mg , but reports improved home readings in the 130/80 range.  No changes today   Lab Results  Component Value Date   CREATININE 2.0 (A) 09/21/2017   Lab Results  Component Value Date   MICROALBUR 742.5 01/03/2017          Relevant Medications   metolazone (ZAROXOLYN) 2.5 MG tablet   torsemide (DEMADEX) 5 MG tablet   Coronary atherosclerosis    She is taking atorvastatin,  An ARB, an aspirin , anda beta blocker .  She is still smoking     Lab Results  Component Value Date   CHOL 136 08/25/2017   HDL 60.30 08/25/2017   LDLCALC 53 08/25/2017   LDLDIRECT 51.9 09/07/2012   TRIG 111.0 08/25/2017   CHOLHDL 2 08/25/2017         Relevant Medications   metolazone (ZAROXOLYN) 2.5 MG tablet   torsemide (DEMADEX) 5 MG tablet   CKD (chronic kidney disease), stage III (HCC)    With solitary kidney,  Microscopic hematuria and  Nephrotic  range proteinuria  Stable by repeat labs.  Patient is seeing [facility]  Lab Results  Component Value Date   CREATININE 2.0 (A) 09/21/2017   Lab Results  Component Value Date   NA 141 09/21/2017   K 4.4 09/21/2017   CL 105 09/08/2017   CO2 28 09/08/2017          Other Visit Diagnoses  Multiple skin nodules    -  Primary   Relevant Orders   Ambulatory referral to  Dermatology      I have changed Andrea Stewart's torsemide. I am also having her maintain her calcium carbonate, DAILY MULTIPLE VITAMINS PO, insulin lispro, fluticasone, mometasone, insulin NPH Human, aspirin, nitroGLYCERIN, sodium chloride, AEROCHAMBER MV, budesonide-formoterol, irbesartan, co-enzyme Q-10, albuterol, amLODipine, carvedilol, atorvastatin, and metolazone. We will continue to administer lactated ringers, ondansetron (ZOFRAN) 4 mg in sodium chloride 0.9 % 50 mL IVPB, albuterol, and traMADol.  Meds ordered this encounter  Medications  . metolazone (ZAROXOLYN) 2.5 MG tablet    Sig: TAKE 1 TABLET BY MOUTH DAILY 30 MINUTES PRIOR TO TORSEMIDE    Dispense:  90 tablet    Refill:  1    Keep on file for next refill  . torsemide (DEMADEX) 5 MG tablet    Sig: Take 1 tablet (5 mg total) by mouth daily.    Dispense:  90 tablet    Refill:  1    Medications Discontinued During This Encounter  Medication Reason  . carvedilol (COREG) 9.79 MG tablet Duplicate  . metolazone (ZAROXOLYN) 2.5 MG tablet   . torsemide (DEMADEX) 10 MG tablet Reorder   A total of 40 minutes was spent with patient more than half of which was spent in counseling patient on the above mentioned issues , reviewing and explaining recent labs and imaging studies done, and coordination of care.  Follow-up: Return in about 6 months (around 10/05/2018) for cpe.   Crecencio Mc, MD

## 2018-04-05 ENCOUNTER — Telehealth: Payer: Self-pay | Admitting: Gastroenterology

## 2018-04-05 NOTE — Telephone Encounter (Signed)
Patient called canceled her office visit to discuess colonoscopy. She stated she is going to go back to Dr. Vira Agar.

## 2018-04-06 DIAGNOSIS — Z800 Family history of malignant neoplasm of digestive organs: Secondary | ICD-10-CM | POA: Insufficient documentation

## 2018-04-06 NOTE — Assessment & Plan Note (Signed)
Referral to [facility] made . Last colonoscop was in 2013 and 5 yr follow up was due last year but deferred by patient

## 2018-04-06 NOTE — Assessment & Plan Note (Signed)
She is taking atorvastatin,  An ARB, an aspirin , anda beta blocker .  She is still smoking     Lab Results  Component Value Date   CHOL 136 08/25/2017   HDL 60.30 08/25/2017   LDLCALC 53 08/25/2017   LDLDIRECT 51.9 09/07/2012   TRIG 111.0 08/25/2017   CHOLHDL 2 08/25/2017

## 2018-04-06 NOTE — Assessment & Plan Note (Signed)
Not at goal on irbesartan 150 mg, amlodipine 10 mg   and carvedilol. 6.25 mg , but reports improved home readings in the 130/80 range.  No changes today   Lab Results  Component Value Date   CREATININE 2.0 (A) 09/21/2017   Lab Results  Component Value Date   MICROALBUR 742.5 01/03/2017

## 2018-04-06 NOTE — Assessment & Plan Note (Signed)
With solitary kidney,  Microscopic hematuria and  Nephrotic  range proteinuria  Stable by repeat labs.  Patient is seeing [facility]  Lab Results  Component Value Date   CREATININE 2.0 (A) 09/21/2017   Lab Results  Component Value Date   NA 141 09/21/2017   K 4.4 09/21/2017   CL 105 09/08/2017   CO2 28 09/08/2017

## 2018-04-19 ENCOUNTER — Encounter: Payer: Self-pay | Admitting: Internal Medicine

## 2018-04-20 ENCOUNTER — Ambulatory Visit: Payer: Medicare Other | Admitting: Pulmonary Disease

## 2018-04-25 ENCOUNTER — Ambulatory Visit
Admission: RE | Admit: 2018-04-25 | Discharge: 2018-04-25 | Disposition: A | Discharge status: Home / Self Care | Payer: Medicare Other | Source: Ambulatory Visit | Attending: Nurse Practitioner | Admitting: Nurse Practitioner

## 2018-04-25 DIAGNOSIS — Z87891 Personal history of nicotine dependence: Secondary | ICD-10-CM | POA: Diagnosis not present

## 2018-04-25 DIAGNOSIS — I700 Atherosclerosis of aorta: Secondary | ICD-10-CM | POA: Diagnosis not present

## 2018-04-25 DIAGNOSIS — J438 Other emphysema: Secondary | ICD-10-CM | POA: Insufficient documentation

## 2018-04-25 DIAGNOSIS — Z122 Encounter for screening for malignant neoplasm of respiratory organs: Secondary | ICD-10-CM | POA: Insufficient documentation

## 2018-04-25 DIAGNOSIS — J432 Centrilobular emphysema: Secondary | ICD-10-CM | POA: Diagnosis not present

## 2018-04-25 DIAGNOSIS — I2510 Atherosclerotic heart disease of native coronary artery without angina pectoris: Secondary | ICD-10-CM | POA: Insufficient documentation

## 2018-05-01 ENCOUNTER — Encounter: Payer: Self-pay | Admitting: *Deleted

## 2018-05-04 DIAGNOSIS — E119 Type 2 diabetes mellitus without complications: Secondary | ICD-10-CM | POA: Diagnosis not present

## 2018-05-04 DIAGNOSIS — Z800 Family history of malignant neoplasm of digestive organs: Secondary | ICD-10-CM | POA: Diagnosis not present

## 2018-05-15 ENCOUNTER — Ambulatory Visit: Payer: Medicare Other | Admitting: Gastroenterology

## 2018-05-16 ENCOUNTER — Ambulatory Visit: Payer: Medicare Other | Admitting: Gastroenterology

## 2018-05-16 DIAGNOSIS — M47816 Spondylosis without myelopathy or radiculopathy, lumbar region: Secondary | ICD-10-CM | POA: Diagnosis not present

## 2018-05-25 ENCOUNTER — Ambulatory Visit (INDEPENDENT_AMBULATORY_CARE_PROVIDER_SITE_OTHER): Payer: Medicare Other | Admitting: Pulmonary Disease

## 2018-05-25 ENCOUNTER — Encounter: Payer: Self-pay | Admitting: Pulmonary Disease

## 2018-05-25 VITALS — BP 152/80 | HR 80 | Ht 62.0 in | Wt 196.0 lb

## 2018-05-25 DIAGNOSIS — F17200 Nicotine dependence, unspecified, uncomplicated: Secondary | ICD-10-CM | POA: Diagnosis not present

## 2018-05-25 DIAGNOSIS — I70212 Atherosclerosis of native arteries of extremities with intermittent claudication, left leg: Secondary | ICD-10-CM | POA: Diagnosis not present

## 2018-05-25 DIAGNOSIS — J449 Chronic obstructive pulmonary disease, unspecified: Secondary | ICD-10-CM

## 2018-05-25 MED ORDER — UMECLIDINIUM-VILANTEROL 62.5-25 MCG/INH IN AEPB: 1.0000 | INHALATION_SPRAY | Freq: Every day | RESPIRATORY_TRACT | 0 refills | Status: AC

## 2018-05-25 NOTE — Progress Notes (Signed)
Subjective:    Patient ID: Andrea Stewart, female    DOB: 12-May-1946, 72 y.o.   MRN: [id number]  Synopsis: Referred in March 2016 for evaluation of ongoing shortness of breath. She had a CT scan performed at [facility] in 2015 which demonstrated tree-in-bud abnormalities in the right upper lobe as well as a left lower lobe pulmonary nodule. A repeat CT scan was performed in January 2016 showing a right lower lobe pleural-based pulmonary nodule 1.27 m in size. This resolved on a April 2016 CT chest.   Quit smoking in 2018> she restarted again.    HPI Chief Complaint  Patient presents with  . Follow-up   Andrea Stewart says she is "hanging in there".  Sometimes she will "do real good" and quit smoking but then family issues will cause a lot of stress and then she will smoke.    She hasn't had bronchitis or pneumonia. She is compliant with her symbicort.  She says that Anoro is cheaper.    Past Medical History:  Diagnosis Date  . Backache, unspecified    s/p c-spine and l-spine fusions  . Degeneration of intervertebral disc, site unspecified   . Depressive disorder, not elsewhere classified   . Disorder of bone and cartilage, unspecified   . HTN (hypertension)   . Hx of left heart catheterization by cutdown    a. 7 years ago; no significant cad; b. Lexiscan 2014: no significant ischemia, no significant EKG changes concerning for ischemia, EF 67%, overall low risk study  . Kidney failure    Dr Cyndia Diver  . Myalgia and myositis, unspecified   . Obesity, unspecified   . Personal history of tobacco use, presenting hazards to health    1/2 ppd  . Plantar fascial fibromatosis   . S/P cholecystectomy   . S/p nephrectomy   . Type II or unspecified type diabetes mellitus without mention of complication, not stated as uncontrolled   . Unspecified essential hypertension   . Unspecified hereditary and idiopathic peripheral neuropathy    secondary to diabetes      Review of Systems  Constitutional:  Negative for chills, fatigue and fever.  HENT: Negative for postnasal drip, rhinorrhea and sinus pressure.   Respiratory: Positive for shortness of breath. Negative for cough and wheezing.   Cardiovascular: Negative for chest pain, palpitations and leg swelling.       Objective:   Physical Exam Vitals:   05/25/18 1142  BP: (!) 152/80  Pulse: 80  SpO2: 94%  Weight: 196 lb (88.9 kg)  Height: 5\' 2"  (1.575 m)   RA  Gen: well appearing HENT: OP clear, TM's clear, neck supple PULM: CTA B, normal percussion CV: RRR, no mgr, trace edema GI: BS+, soft, nontender Derm: no cyanosis or rash Psyche: normal mood and affect   Chest imaging: 11/20/2014 CT chest> tree in bud, inflammatory appearing abnormalities in the right upper lobe, left upper lobe pleural-based 1.2 cm solid nodule 02/2015 CT chest > right upper lobe tree-in-bud abnormalities persistent, the pulmonary nodule seen on the previous study has resolved, thyroid nodule stable  PFT: 01/2015 PFT> normal ratio but consistent with moderate obstruction (FEV1 1.16L (58% pred)), TLC 4.57L (99% pred), RV 133% pred, DLCO 27.6 (136% pred)  Micro: 2016 Bronch> AFB bal negative  CBC    Component Value Date/Time   WBC 6.8 04/04/2018   WBC 8.2 09/07/2017 1845   RBC 4.14 09/07/2017 1845   HGB 12.0 04/04/2018   HCT 36 04/04/2018   PLT 219  04/04/2018   MCV 91.4 09/07/2017 1845   MCH 30.9 09/07/2017 1845   MCHC 33.8 09/07/2017 1845   RDW 14.3 09/07/2017 1845   LYMPHSABS 1.9 09/07/2017 1845   MONOABS 0.7 09/07/2017 1845   EOSABS 0.5 09/07/2017 1845   BASOSABS 0.1 09/07/2017 1845        Assessment & Plan:   COPD (chronic obstructive pulmonary disease) with chronic bronchitis (HCC)  Tobacco use disorder  Discussion: This has been a stable interval for Andrea Stewart.  She has not had an exacerbation since the last visit.  She is interested in changing from Symbicort to [facility] for cost reasons.  Plan: Cigarette smoker: Stop  smoking  COPD: Try changing from Symbicort to Anoro 1 puff daily Call me if you think that the Anoro is helping and we will change her prescription Use albuterol as needed for chest tightness wheezing or shortness of breath Stay active Get a flu shot in the fall Practice good hand hygiene  Follow-up in 6 months or sooner if needed   Current Outpatient Medications:  .  albuterol (PROVENTIL HFA) 108 (90 Base) MCG/ACT inhaler, Inhale 2 puffs every 4 (four) hours as needed into the lungs for wheezing or shortness of breath., Disp: 1 Inhaler, Rfl: 2 .  amLODipine (NORVASC) 10 MG tablet, TAKE 1 TABLET BY MOUTH EVERY DAY, Disp: 90 tablet, Rfl: 1 .  aspirin 81 MG tablet, Take 1 tablet (81 mg total) by mouth daily., Disp: , Rfl:  .  atorvastatin (LIPITOR) 20 MG tablet, TAKE 1 TABLET BY MOUTH EVERY DAY, Disp: 90 tablet, Rfl: 1 .  budesonide-formoterol (SYMBICORT) 160-4.5 MCG/ACT inhaler, Inhale 2 puffs into the lungs 2 (two) times daily., Disp: 1 Inhaler, Rfl: 5 .  calcium carbonate (OS-CAL) 600 MG TABS, Take 600 mg by mouth daily.  , Disp: , Rfl:  .  carvedilol (COREG) 6.25 MG tablet, Take 1 tablet (6.25 mg total) by mouth daily., Disp: 90 tablet, Rfl: 1 .  co-enzyme Q-10 30 MG capsule, Take 30 mg by mouth daily., Disp: , Rfl:  .  DAILY MULTIPLE VITAMINS PO, Take 1 tablet by mouth daily.  , Disp: , Rfl:  .  fluticasone (VERAMYST) 27.5 MCG/SPRAY nasal spray, Place 2 sprays into the nose as needed for rhinitis or allergies. , Disp: , Rfl:  .  insulin lispro (HUMALOG) 100 UNIT/ML injection, Inject into the skin as directed. Sliding scale 100-150 = 2-2.5 units 150-200 = 3-3.5 units 200-250 = 4-4.5 units 251-300 = 5-5.5 units 301-350 = 6-6.5 units 350-400 = 7-7.5 units, Disp: , Rfl:  .  insulin NPH (HUMULIN N,NOVOLIN N) 100 UNIT/ML injection, Inject 22 Units into the skin daily before breakfast. 24 units at bedtime, Disp: , Rfl:  .  irbesartan (AVAPRO) 150 MG tablet, Take 150 mg by mouth 2 (two) times  daily. , Disp: , Rfl:  .  metolazone (ZAROXOLYN) 2.5 MG tablet, TAKE 1 TABLET BY MOUTH DAILY 30 MINUTES PRIOR TO TORSEMIDE, Disp: 90 tablet, Rfl: 1 .  mometasone (NASONEX) 50 MCG/ACT nasal spray, Place 2 sprays into the nose daily as needed. , Disp: , Rfl:  .  nitroGLYCERIN (NITROSTAT) 0.4 MG SL tablet, Place 1 tablet (0.4 mg total) under the tongue every 5 (five) minutes as needed. Maximum 3 tablets (Patient taking differently: Place 0.4 mg under the tongue every 5 (five) minutes as needed for chest pain. Maximum 3 tablets), Disp: 25 tablet, Rfl: 6 .  sodium chloride (OCEAN) 0.65 % SOLN nasal spray, Place 2 sprays into both  nostrils as needed for congestion., Disp: , Rfl:  .  Spacer/Aero-Holding Chambers (AEROCHAMBER MV) inhaler, Use as instructed, Disp: 1 each, Rfl: 0 .  torsemide (DEMADEX) 5 MG tablet, Take 1 tablet (5 mg total) by mouth daily., Disp: 90 tablet, Rfl: 1 .  umeclidinium-vilanterol (ANORO ELLIPTA) 62.5-25 MCG/INH AEPB, Inhale 1 puff into the lungs daily for 1 day., Disp: 14 each, Rfl: 0  Current Facility-Administered Medications:  .  albuterol (PROVENTIL HFA;VENTOLIN HFA) 108 (90 Base) MCG/ACT inhaler 2 puff, 2 puff, Inhalation, Q4H PRN, Byrnett, Forest Gleason, MD .  lactated ringers infusion, , Intravenous, Continuous, Byrnett, Forest Gleason, MD .  ondansetron (ZOFRAN) 4 mg in sodium chloride 0.9 % 50 mL IVPB, 4 mg, Intravenous, Q4H PRN, Robert Bellow, MD .  traMADol Veatrice Bourbon) tablet 50 mg, 50 mg, Oral, Q4H PRN, Bary Castilla, Forest Gleason, MD

## 2018-05-25 NOTE — Patient Instructions (Signed)
Cigarette smoker: Stop smoking  COPD: Try changing from Symbicort to Anoro 1 puff daily Call me if you think that the Anoro is helping and we will change her prescription Use albuterol as needed for chest tightness wheezing or shortness of breath Stay active Get a flu shot in the fall Practice good hand hygiene  Follow-up in 6 months or sooner if needed

## 2018-05-29 ENCOUNTER — Other Ambulatory Visit (INDEPENDENT_AMBULATORY_CARE_PROVIDER_SITE_OTHER): Payer: Self-pay | Admitting: Vascular Surgery

## 2018-05-29 DIAGNOSIS — I70219 Atherosclerosis of native arteries of extremities with intermittent claudication, unspecified extremity: Secondary | ICD-10-CM

## 2018-05-30 ENCOUNTER — Encounter (INDEPENDENT_AMBULATORY_CARE_PROVIDER_SITE_OTHER): Payer: Self-pay | Admitting: Vascular Surgery

## 2018-05-30 ENCOUNTER — Ambulatory Visit (INDEPENDENT_AMBULATORY_CARE_PROVIDER_SITE_OTHER): Payer: Medicare Other | Admitting: Vascular Surgery

## 2018-05-30 ENCOUNTER — Ambulatory Visit (INDEPENDENT_AMBULATORY_CARE_PROVIDER_SITE_OTHER): Payer: Medicare Other

## 2018-05-30 VITALS — BP 165/75 | HR 69 | Resp 15 | Ht 60.0 in | Wt 196.0 lb

## 2018-05-30 DIAGNOSIS — N183 Chronic kidney disease, stage 3 unspecified: Secondary | ICD-10-CM

## 2018-05-30 DIAGNOSIS — IMO0002 Reserved for concepts with insufficient information to code with codable children: Secondary | ICD-10-CM

## 2018-05-30 DIAGNOSIS — I70219 Atherosclerosis of native arteries of extremities with intermittent claudication, unspecified extremity: Secondary | ICD-10-CM | POA: Diagnosis not present

## 2018-05-30 DIAGNOSIS — E118 Type 2 diabetes mellitus with unspecified complications: Secondary | ICD-10-CM

## 2018-05-30 DIAGNOSIS — E1165 Type 2 diabetes mellitus with hyperglycemia: Secondary | ICD-10-CM | POA: Diagnosis not present

## 2018-05-30 DIAGNOSIS — I739 Peripheral vascular disease, unspecified: Secondary | ICD-10-CM

## 2018-05-30 DIAGNOSIS — I10 Essential (primary) hypertension: Secondary | ICD-10-CM

## 2018-05-30 DIAGNOSIS — I70212 Atherosclerosis of native arteries of extremities with intermittent claudication, left leg: Secondary | ICD-10-CM | POA: Diagnosis not present

## 2018-05-30 NOTE — Progress Notes (Signed)
MRN : [id number]  Andrea Stewart is a 72 y.o. (1946-06-26) female who presents with chief complaint of  Chief Complaint  Patient presents with  . Follow-up    6 month ABI [place]  .  History of Present Illness: Patient returns today in follow up of PAD.  She has what sounds like some neuropathic pain in her legs as well as some chronic claudication.  This is not really changed much since her last visit.  Her ABIs today are basically stable at 0.88 on the right and 0.72 on the left.  This slightly higher on the right and slightly lower on the left than they were 6 months ago.  Current Outpatient Medications  Medication Sig Dispense Refill  . albuterol (PROVENTIL HFA) 108 (90 Base) MCG/ACT inhaler Inhale 2 puffs every 4 (four) hours as needed into the lungs for wheezing or shortness of breath. 1 Inhaler 2  . amLODipine (NORVASC) 10 MG tablet TAKE 1 TABLET BY MOUTH EVERY DAY 90 tablet 1  . aspirin 81 MG tablet Take 1 tablet (81 mg total) by mouth daily.    Marland Kitchen atorvastatin (LIPITOR) 20 MG tablet TAKE 1 TABLET BY MOUTH EVERY DAY 90 tablet 1  . budesonide-formoterol (SYMBICORT) 160-4.5 MCG/ACT inhaler Inhale 2 puffs into the lungs 2 (two) times daily. 1 Inhaler 5  . calcium carbonate (OS-CAL) 600 MG TABS Take 600 mg by mouth daily.      . carvedilol (COREG) 6.25 MG tablet Take 1 tablet (6.25 mg total) by mouth daily. 90 tablet 1  . co-enzyme Q-10 30 MG capsule Take 30 mg by mouth daily.    Marland Kitchen DAILY MULTIPLE VITAMINS PO Take 1 tablet by mouth daily.      . fluticasone (VERAMYST) 27.5 MCG/SPRAY nasal spray Place 2 sprays into the nose as needed for rhinitis or allergies.     Marland Kitchen insulin lispro (HUMALOG) 100 UNIT/ML injection Inject into the skin as directed. Sliding scale 100-150 = 2-2.5 units 150-200 = 3-3.5 units 200-250 = 4-4.5 units 251-300 = 5-5.5 units 301-350 = 6-6.5 units 350-400 = 7-7.5 units    . insulin NPH (HUMULIN N,NOVOLIN N) 100 UNIT/ML injection Inject 22 Units into the skin daily  before breakfast. 24 units at bedtime    . irbesartan (AVAPRO) 150 MG tablet Take 150 mg by mouth 2 (two) times daily.     . metolazone (ZAROXOLYN) 2.5 MG tablet TAKE 1 TABLET BY MOUTH DAILY 30 MINUTES PRIOR TO TORSEMIDE 90 tablet 1  . mometasone (NASONEX) 50 MCG/ACT nasal spray Place 2 sprays into the nose daily as needed.     . nitroGLYCERIN (NITROSTAT) 0.4 MG SL tablet Place 1 tablet (0.4 mg total) under the tongue every 5 (five) minutes as needed. Maximum 3 tablets (Patient taking differently: Place 0.4 mg under the tongue every 5 (five) minutes as needed for chest pain. Maximum 3 tablets) 25 tablet 6  . sodium chloride (OCEAN) 0.65 % SOLN nasal spray Place 2 sprays into both nostrils as needed for congestion.    Marland Kitchen Spacer/Aero-Holding Chambers (AEROCHAMBER MV) inhaler Use as instructed 1 each 0  . torsemide (DEMADEX) 5 MG tablet Take 1 tablet (5 mg total) by mouth daily. 90 tablet 1   Current Facility-Administered Medications  Medication Dose Route Frequency Provider Last Rate Last Dose  . albuterol (PROVENTIL HFA;VENTOLIN HFA) 108 (90 Base) MCG/ACT inhaler 2 puff  2 puff Inhalation Q4H PRN Byrnett, Forest Gleason, MD      . lactated ringers infusion  Intravenous Continuous Byrnett, Forest Gleason, MD      . ondansetron [facility]) 4 mg in sodium chloride 0.9 % 50 mL IVPB  4 mg Intravenous Q4H PRN Robert Bellow, MD      . traMADol Veatrice Bourbon) tablet 50 mg  50 mg Oral Q4H PRN Robert Bellow, MD        Past Medical History:  Diagnosis Date  . Backache, unspecified    s/p c-spine and l-spine fusions  . Degeneration of intervertebral disc, site unspecified   . Depressive disorder, not elsewhere classified   . Disorder of bone and cartilage, unspecified   . HTN (hypertension)   . Hx of left heart catheterization by cutdown    a. 7 years ago; no significant cad; b. Lexiscan 2014: no significant ischemia, no significant EKG changes concerning for ischemia, EF 67%, overall low risk study  . Kidney  failure    Dr Cyndia Diver  . Myalgia and myositis, unspecified   . Obesity, unspecified   . Personal history of tobacco use, presenting hazards to health    1/2 ppd  . Plantar fascial fibromatosis   . S/P cholecystectomy   . S/p nephrectomy   . Type II or unspecified type diabetes mellitus without mention of complication, not stated as uncontrolled   . Unspecified essential hypertension   . Unspecified hereditary and idiopathic peripheral neuropathy    secondary to diabetes    Past Surgical History:  Procedure Laterality Date  . ABDOMINAL HYSTERECTOMY    . APPENDECTOMY    . BACK SURGERY     2006  . CARDIAC CATHETERIZATION     o.k. 2003  . CARPAL TUNNEL RELEASE    . CERVICAL DISCECTOMY    . CHOLECYSTECTOMY    . COLONOSCOPY  09/1999   colonoscopy-hemorrhoids, re-check 5 years (10/2006)  . dexa     osteopenia (01/2004)  . ELBOW SURGERY    . FOOT SURGERY     x 3  . KIDNEY DONATION  1991  . LUMBAR FUSION    . NECK SURGERY     06/2005  . Problem with general Anesthesia     02/2006  . REPLACEMENT TOTAL KNEE  10/2004  . trigger finger surgery    . VENTRAL HERNIA REPAIR N/A 09/08/2017   Primary repair of a 10 x 15 mm infraumbilical fascial defect.  Surgeon: Robert Bellow, MD;  Location: [facility] ORS;  Service: General;  Laterality: N/A;  . VESICOVAGINAL FISTULA CLOSURE W/ TAH    . VIDEO BRONCHOSCOPY Bilateral 04/02/2015   Procedure: VIDEO BRONCHOSCOPY WITHOUT FLUORO;  Surgeon: Juanito Doom, MD;  Location: [facility] ENDOSCOPY;  Service: Cardiopulmonary;  Laterality: Bilateral;    Social History Social History   Tobacco Use  . Smoking status: Current Every Day Smoker    Packs/day: 0.25    Years: 30.00    Pack years: 7.50    Types: Cigarettes  . Smokeless tobacco: Never Used  . Tobacco comment: currently smoking .25 ppd, trying to quit 02/13/16  Substance Use Topics  . Alcohol use: No    Alcohol/week: 0.0 oz  . Drug use: No    Family History Family History  Problem  Relation Age of Onset  . Hypertension Mother   . Colon cancer Mother   . Cancer Mother        colon  . Coronary artery disease Mother   . Diabetes Mother   . Prostate cancer Father   . COPD Father   . Cancer Father  prostate  . Heart attack Brother 56  . Sleep apnea Brother   . Diabetes Paternal Aunt   . Diabetes Paternal Uncle   . Cancer Daughter        Brain, lungs, liver, spine, kidney    Allergies  Allergen Reactions  . Morphine Anaphylaxis  . Prednisone Other (See Comments)    "makes me think I am having a heart attack"  . Gabapentin Other (See Comments)    intolerance  . Nsaids Diarrhea  . Penicillins Swelling    Has patient had a PCN reaction causing immediate rash, facial/tongue/throat swelling, SOB or lightheadedness with hypotension: Yes Has patient had a PCN reaction causing severe rash involving mucus membranes or skin necrosis: No Has patient had a PCN reaction that required hospitalization: No Has patient had a PCN reaction occurring within the last 10 years: No If all of the above answers are "NO", then may proceed with Cephalosporin use.     REVIEW OF SYSTEMS(Negative unless checked)  Constitutional: [] Weight loss[] Fever[] Chills Cardiac:[] Chest pain[] Chest pressure[] Palpitations [] Shortness of breath when laying flat [] Shortness of breath at rest [x] Shortness of breath with exertion. Vascular: [] Pain in legs with walking[] Pain in legsat rest[] Pain in legs when laying flat [] Claudication [] Pain in feet when walking [] Pain in feet at rest [] Pain in feet when laying flat [] History of DVT [] Phlebitis [x] Swelling in legs [] Varicose veins [] Non-healing ulcers Pulmonary: [] Uses home oxygen [] Productive cough[] Hemoptysis [] Wheeze [] COPD [] Asthma Neurologic: [] Dizziness [] Blackouts [] Seizures [] History of stroke [] History of TIA[] Aphasia [] Temporary blindness[] Dysphagia [] Weaknessor  numbness in arms [] Weakness or numbnessin legs Musculoskeletal: [x] Arthritis [] Joint swelling [] Joint pain [x] Low back pain Hematologic:[x] Easy bruising[] Easy bleeding [] Hypercoagulable state [] Anemic [] Hepatitis Gastrointestinal:[] Blood in stool[] Vomiting blood[x] Gastroesophageal reflux/heartburn[] Abdominal pain Genitourinary: [x] Chronic kidney disease [] Difficulturination [] Frequenturination [] Burning with urination[] Hematuria Skin: [] Rashes [] Ulcers [] Wounds Psychological: [] History of anxiety    Physical Examination  BP (!) 165/75 (BP Location: Right Arm, Patient Position: Sitting)   Pulse 69   Resp 15   Ht 5' (1.524 m)   Wt 196 lb (88.9 kg)   BMI 38.28 kg/m  Gen:  WD/WN, NAD Head: [place]/AT, No temporalis wasting. Ear/Nose/Throat: Hearing grossly intact, nares w/o erythema or drainage Eyes: Conjunctiva clear. Sclera non-icteric Neck: Supple.  Trachea midline Pulmonary:  Good air movement, no use of accessory muscles.  Cardiac: RRR, no JVD Vascular:  Vessel Right Left  Radial Palpable Palpable                          PT  1+ palpable  1+ palpable  DP  1+ palpable  not palpable   Musculoskeletal: M/S 5/5 throughout.  No deformity or atrophy.  Trace lower extremity edema. Neurologic: Sensation grossly intact in extremities.  Symmetrical.  Speech is fluent.  Psychiatric: Judgment intact, Mood & affect appropriate for pt's clinical situation. Dermatologic: No rashes or ulcers noted.  No cellulitis or open wounds.       Labs Recent Results (from the past 2160 hour(s))  Microalbumin, urine     Status: None   Collection Time: 04/04/18 12:00 AM  Result Value Ref Range   Microalb, Ur protein/creatinine [id number]   CBC and differential     Status: None   Collection Time: 04/04/18 12:00 AM  Result Value Ref Range   Hemoglobin 12.0 12.0 - 16.0   HCT 36 36 - 46   Neutrophils Absolute 3,951    Platelets 219 150 - 399   WBC  6.8   Basic metabolic panel     Status: Abnormal  Collection Time: 04/04/18 12:00 AM  Result Value Ref Range   Glucose 105    BUN 36 (A) 4 - 21   Creatinine 2.1 (A) 0.5 - 1.1   Potassium 4.2 3.4 - 5.3   Sodium 142 137 - 147    Radiology No results found.  Assessment/Plan Essential hypertension blood pressure control important in reducing the progression of atherosclerotic disease. On appropriate oral medications.   Diabetes mellitus type 2 with complications, uncontrolled blood glucose control important in reducing the progression of atherosclerotic disease. Also, involved in wound healing. On appropriate medications.   CKD (chronic kidney disease), stage III Would plan limited contrast used with any intervention.   PAD (peripheral artery disease) (HCC) Her ABIs today are basically stable at 0.88 on the right and 0.72 on the left.  This slightly higher on the right and slightly lower on the left than they were 6 months ago. Overall she is doing reasonably well.  Symptoms are stable.  No limb threatening symptoms.  Continue current medical regimen of aspirin and Lipitor and recheck in 6 months.    Leotis Pain, MD  05/30/2018 4:20 PM    This note was created with Dragon medical transcription system.  Any errors from dictation are purely unintentional

## 2018-05-30 NOTE — Assessment & Plan Note (Signed)
Her ABIs today are basically stable at 0.88 on the right and 0.72 on the left.  This slightly higher on the right and slightly lower on the left than they were 6 months ago. Overall she is doing reasonably well.  Symptoms are stable.  No limb threatening symptoms.  Continue current medical regimen of aspirin and Lipitor and recheck in 6 months.

## 2018-06-21 DIAGNOSIS — Z96651 Presence of right artificial knee joint: Secondary | ICD-10-CM | POA: Diagnosis not present

## 2018-06-21 DIAGNOSIS — Z96652 Presence of left artificial knee joint: Secondary | ICD-10-CM | POA: Diagnosis not present

## 2018-06-21 DIAGNOSIS — M25562 Pain in left knee: Secondary | ICD-10-CM | POA: Diagnosis not present

## 2018-06-21 DIAGNOSIS — M25561 Pain in right knee: Secondary | ICD-10-CM | POA: Diagnosis not present

## 2018-07-31 DIAGNOSIS — D631 Anemia in chronic kidney disease: Secondary | ICD-10-CM | POA: Diagnosis not present

## 2018-07-31 DIAGNOSIS — R809 Proteinuria, unspecified: Secondary | ICD-10-CM | POA: Diagnosis not present

## 2018-07-31 DIAGNOSIS — Z524 Kidney donor: Secondary | ICD-10-CM | POA: Diagnosis not present

## 2018-07-31 DIAGNOSIS — E8809 Other disorders of plasma-protein metabolism, not elsewhere classified: Secondary | ICD-10-CM | POA: Diagnosis not present

## 2018-07-31 DIAGNOSIS — R319 Hematuria, unspecified: Secondary | ICD-10-CM | POA: Diagnosis not present

## 2018-07-31 DIAGNOSIS — I10 Essential (primary) hypertension: Secondary | ICD-10-CM | POA: Diagnosis not present

## 2018-07-31 DIAGNOSIS — R768 Other specified abnormal immunological findings in serum: Secondary | ICD-10-CM | POA: Diagnosis not present

## 2018-07-31 DIAGNOSIS — N183 Chronic kidney disease, stage 3 (moderate): Secondary | ICD-10-CM | POA: Diagnosis not present

## 2018-07-31 DIAGNOSIS — E1122 Type 2 diabetes mellitus with diabetic chronic kidney disease: Secondary | ICD-10-CM | POA: Diagnosis not present

## 2018-07-31 LAB — BASIC METABOLIC PANEL
BUN: 37 — AB (ref 4–21)
Creatinine: 2.1 — AB (ref 0.5–1.1)
Potassium: 4.3 (ref 3.4–5.3)
Sodium: 139 (ref 137–147)

## 2018-07-31 LAB — CBC AND DIFFERENTIAL
HCT: 36 (ref 36–46)
HEMOGLOBIN: 12.1 (ref 12.0–16.0)
PLATELETS: 209 (ref 150–399)

## 2018-07-31 LAB — HEPATIC FUNCTION PANEL: ALK PHOS: 4.2 — AB (ref 25–125)

## 2018-08-07 ENCOUNTER — Ambulatory Visit: Payer: Medicare Other

## 2018-08-07 DIAGNOSIS — I129 Hypertensive chronic kidney disease with stage 1 through stage 4 chronic kidney disease, or unspecified chronic kidney disease: Secondary | ICD-10-CM | POA: Diagnosis not present

## 2018-08-07 DIAGNOSIS — E1122 Type 2 diabetes mellitus with diabetic chronic kidney disease: Secondary | ICD-10-CM | POA: Diagnosis not present

## 2018-08-07 DIAGNOSIS — N2581 Secondary hyperparathyroidism of renal origin: Secondary | ICD-10-CM | POA: Diagnosis not present

## 2018-08-07 DIAGNOSIS — N184 Chronic kidney disease, stage 4 (severe): Secondary | ICD-10-CM | POA: Diagnosis not present

## 2018-08-07 DIAGNOSIS — R809 Proteinuria, unspecified: Secondary | ICD-10-CM | POA: Diagnosis not present

## 2018-08-18 DIAGNOSIS — M47816 Spondylosis without myelopathy or radiculopathy, lumbar region: Secondary | ICD-10-CM | POA: Diagnosis not present

## 2018-08-21 ENCOUNTER — Encounter: Payer: Self-pay | Admitting: Lab

## 2018-08-23 ENCOUNTER — Encounter: Payer: Self-pay | Admitting: Internal Medicine

## 2018-08-23 ENCOUNTER — Ambulatory Visit (INDEPENDENT_AMBULATORY_CARE_PROVIDER_SITE_OTHER): Payer: Medicare Other | Admitting: Internal Medicine

## 2018-08-23 ENCOUNTER — Other Ambulatory Visit: Payer: Self-pay | Admitting: Internal Medicine

## 2018-08-23 DIAGNOSIS — J440 Chronic obstructive pulmonary disease with acute lower respiratory infection: Secondary | ICD-10-CM | POA: Diagnosis not present

## 2018-08-23 DIAGNOSIS — I70212 Atherosclerosis of native arteries of extremities with intermittent claudication, left leg: Secondary | ICD-10-CM

## 2018-08-23 DIAGNOSIS — Z23 Encounter for immunization: Secondary | ICD-10-CM | POA: Diagnosis not present

## 2018-08-23 DIAGNOSIS — J209 Acute bronchitis, unspecified: Secondary | ICD-10-CM | POA: Diagnosis not present

## 2018-08-23 MED ORDER — DOXYCYCLINE HYCLATE 100 MG PO TABS: 100.0000 mg | ORAL_TABLET | Freq: Two times a day (BID) | ORAL | 0 refills | Status: DC

## 2018-08-23 NOTE — Progress Notes (Signed)
Subjective:  Patient ID: Andrea Stewart, female    DOB: [birth year]  Age: 72 y.o. MRN: [id number]  CC: Diagnoses of Encounter for immunization and Acute bronchitis with COPD ([place]) were pertinent to this visit.  HPI Andrea Stewart presents for signs and symptoms of URI  Of several days duration. Started with sore throat , chest tightness and  Rhinitis with clear nasal drainage.  Cough has been worse  at night  .  Denies fevers,  Body aches.   Outpatient Medications Prior to Visit  Medication Sig Dispense Refill  . albuterol (PROVENTIL HFA) 108 (90 Base) MCG/ACT inhaler Inhale 2 puffs every 4 (four) hours as needed into the lungs for wheezing or shortness of breath. 1 Inhaler 2  . aspirin 81 MG tablet Take 1 tablet (81 mg total) by mouth daily.    Marland Kitchen atorvastatin (LIPITOR) 20 MG tablet TAKE 1 TABLET BY MOUTH EVERY DAY 90 tablet 1  . budesonide-formoterol (SYMBICORT) 160-4.5 MCG/ACT inhaler Inhale 2 puffs into the lungs 2 (two) times daily. 1 Inhaler 5  . calcium carbonate (OS-CAL) 600 MG TABS Take 600 mg by mouth daily.      . carvedilol (COREG) 6.25 MG tablet Take 1 tablet (6.25 mg total) by mouth daily. 90 tablet 1  . co-enzyme Q-10 30 MG capsule Take 30 mg by mouth daily.    Marland Kitchen DAILY MULTIPLE VITAMINS PO Take 1 tablet by mouth daily.      . fluticasone (VERAMYST) 27.5 MCG/SPRAY nasal spray Place 2 sprays into the nose as needed for rhinitis or allergies.     Marland Kitchen insulin lispro (HUMALOG) 100 UNIT/ML injection Inject into the skin as directed. Sliding scale 100-150 = 2-2.5 units 150-200 = 3-3.5 units 200-250 = 4-4.5 units 251-300 = 5-5.5 units 301-350 = 6-6.5 units 350-400 = 7-7.5 units    . insulin NPH (HUMULIN N,NOVOLIN N) 100 UNIT/ML injection Inject 22 Units into the skin daily before breakfast. 24 units at bedtime    . irbesartan (AVAPRO) 150 MG tablet Take 150 mg by mouth 2 (two) times daily.     . metolazone (ZAROXOLYN) 2.5 MG tablet TAKE 1 TABLET BY MOUTH DAILY 30 MINUTES PRIOR  TO TORSEMIDE 90 tablet 1  . mometasone (NASONEX) 50 MCG/ACT nasal spray Place 2 sprays into the nose daily as needed.     . nitroGLYCERIN (NITROSTAT) 0.4 MG SL tablet Place 1 tablet (0.4 mg total) under the tongue every 5 (five) minutes as needed. Maximum 3 tablets (Patient taking differently: Place 0.4 mg under the tongue every 5 (five) minutes as needed for chest pain. Maximum 3 tablets) 25 tablet 6  . sodium chloride (OCEAN) 0.65 % SOLN nasal spray Place 2 sprays into both nostrils as needed for congestion.    Marland Kitchen Spacer/Aero-Holding Chambers (AEROCHAMBER MV) inhaler Use as instructed 1 each 0  . torsemide (DEMADEX) 5 MG tablet Take 1 tablet (5 mg total) by mouth daily. 90 tablet 1  . amLODipine (NORVASC) 10 MG tablet TAKE 1 TABLET BY MOUTH EVERY DAY 90 tablet 1   Facility-Administered Medications Prior to Visit  Medication Dose Route Frequency Provider Last Rate Last Dose  . albuterol (PROVENTIL HFA;VENTOLIN HFA) 108 (90 Base) MCG/ACT inhaler 2 puff  2 puff Inhalation Q4H PRN Byrnett, Forest Gleason, MD      . lactated ringers infusion   Intravenous Continuous Bary Castilla, Forest Gleason, MD      . ondansetron [facility]) 4 mg in sodium chloride 0.9 % 50 mL IVPB  4 mg  Intravenous Q4H PRN Robert Bellow, MD      . traMADol Veatrice Bourbon) tablet 50 mg  50 mg Oral Q4H PRN Robert Bellow, MD        Review of Systems;  Patient denies headache, fevers, malaise, unintentional weight loss, skin rash, eye pain, sinus congestion and sinus pain,  dysphagia,  hemoptysis ,dyspnea, wheezing, chest pain, palpitations, orthopnea, edema, abdominal pain, nausea, melena, diarrhea, constipation, flank pain, dysuria, hematuria, urinary  Frequency, nocturia, numbness, tingling, seizures,  Focal weakness, Loss of consciousness,  Tremor, insomnia, depression, anxiety, and suicidal ideation.      Objective:  BP (!) 170/84 (BP Location: Left Arm, Patient Position: Sitting, Cuff Size: Large)   Pulse 77   Temp 98.4 F (36.9 C)  (Oral)   Resp 17   Ht 5' (1.524 m)   Wt 198 lb 6.4 oz (90 kg)   SpO2 95%   BMI 38.75 kg/m   BP Readings from Last 3 Encounters:  08/23/18 (!) 170/84  05/30/18 (!) 165/75  05/25/18 (!) 152/80    Wt Readings from Last 3 Encounters:  08/23/18 198 lb 6.4 oz (90 kg)  05/30/18 196 lb (88.9 kg)  05/25/18 196 lb (88.9 kg)    General appearance: alert, cooperative and appears stated age Ears: normal TM's and external ear canals both ears Throat: lips, mucosa, and tongue normal; teeth and gums normal Neck: no adenopathy, no carotid bruit, supple, symmetrical, trachea midline and thyroid not enlarged, symmetric, no tenderness/mass/nodules Back: symmetric, no curvature. ROM normal. No CVA tenderness. Lungs: clear to auscultation bilaterally Heart: regular rate and rhythm, S1, S2 normal, no murmur, click, rub or gallop Abdomen: soft, non-tender; bowel sounds normal; no masses,  no organomegaly Pulses: 2+ and symmetric Skin: Skin color, texture, turgor normal. No rashes or lesions Lymph nodes: Cervical, supraclavicular, and axillary nodes normal.  Lab Results  Component Value Date   HGBA1C 9.4 (H) 09/07/2017   HGBA1C 9.7 07/25/2017   HGBA1C 8.9 (A) 01/01/2014    Lab Results  Component Value Date   CREATININE 2.1 (A) 07/31/2018   CREATININE 2.1 (A) 04/04/2018   CREATININE 2.0 (A) 09/21/2017    Lab Results  Component Value Date   WBC 6.8 04/04/2018   HGB 12.1 07/31/2018   HCT 36 07/31/2018   PLT 209 07/31/2018   GLUCOSE 141 (H) 09/08/2017   CHOL 136 08/25/2017   TRIG 111.0 08/25/2017   HDL 60.30 08/25/2017   LDLDIRECT 51.9 09/07/2012   LDLCALC 53 08/25/2017   ALT 16 07/22/2017   AST 18 07/22/2017   NA 139 07/31/2018   K 4.3 07/31/2018   CL 105 09/08/2017   CREATININE 2.1 (A) 07/31/2018   BUN 37 (A) 07/31/2018   CO2 28 09/08/2017   TSH 3.04 08/25/2017   INR 1.01 03/27/2015   HGBA1C 9.4 (H) 09/07/2017   MICROALBUR protein/creatinine [id number] 04/04/2018    Ct  Chest Lung Cancer Screening Low Dose Wo Contrast  Result Date: 04/25/2018 CLINICAL DATA:  72 year old female former smoker (quit 2 days ago) with 32 pack-year history of smoking. Lung cancer screening examination. EXAM: CT CHEST WITHOUT CONTRAST LOW-DOSE FOR LUNG CANCER SCREENING TECHNIQUE: Multidetector CT imaging of the chest was performed following the standard protocol without IV contrast. COMPARISON:  Low-dose lung cancer screening chest CT 03/29/2017. FINDINGS: Cardiovascular: Heart size is normal. There is no significant pericardial fluid, thickening or pericardial calcification. There is aortic atherosclerosis, as well as atherosclerosis of the great vessels of the mediastinum and the coronary arteries, including  calcified atherosclerotic plaque in the left anterior descending and left circumflex coronary arteries. Mediastinum/Nodes: Again noted in the posterior aspect of the superior mediastinum is a 2.8 x 2.1 cm soft tissue attenuation lesion with partial calcifications, similar to prior studies dating back to 2016, likely an exophytic thyroid nodule. No pathologically enlarged mediastinal or hilar lymph nodes. Please note that accurate exclusion of hilar adenopathy is limited on noncontrast CT scans. Esophagus is unremarkable in appearance. No axillary lymphadenopathy. Lungs/Pleura: Small pulmonary nodules are noted in lungs bilaterally, the largest of which is in the inferior segment of the lingula (axial image 183 of series 3), with a volume derived mean diameter of 4.5 mm. No larger more suspicious appearing pulmonary nodules or masses are otherwise noted. No acute consolidative airspace disease. No pleural effusions. Linear scarring throughout the lung bases bilaterally, most evident in the left lower lobe. Mild diffuse bronchial wall thickening with very mild centrilobular and paraseptal emphysema. Upper Abdomen: Aortic atherosclerosis. Surgical clips in the upper left retroperitoneum, potentially  from prior nephrectomy. Musculoskeletal: There are no aggressive appearing lytic or blastic lesions noted in the visualized portions of the skeleton. IMPRESSION: 1. Lung-RADS 2S, benign appearance or behavior. Continue annual screening with low-dose chest CT without contrast in 12 months. 2. The "S" modifier above refers to potentially clinically significant non lung cancer related findings. Specifically, there is aortic atherosclerosis, in addition to 2 vessel coronary artery disease. Please note that although the presence of coronary artery calcium documents the presence of coronary artery disease, the severity of this disease and any potential stenosis cannot be assessed on this non-gated CT examination. Assessment for potential risk factor modification, dietary therapy or pharmacologic therapy may be warranted, if clinically indicated. 3. Mild diffuse bronchial wall thickening with very mild centrilobular and paraseptal emphysema; imaging findings suggestive of underlying COPD. 4. Additional incidental findings, as above. Aortic Atherosclerosis (ICD10-I70.0) and Emphysema (ICD10-J43.9). Electronically Signed   By: Vinnie Langton M.D.   On: 04/25/2018 12:08    Assessment & Plan:   Problem List Items Addressed This Visit    Acute bronchitis with COPD [facility])    She has emphysematous changes on Lung Ca screening  Chest CT done in June, which was reviewed today with patient .  Will treat with  Doxycycline , prednisone taper,  Albuterol ,  Cough suppressant.        Other Visit Diagnoses    Encounter for immunization       Relevant Orders   Flu vaccine HIGH DOSE PF (Completed)    .A total of 25 minutes of face to face time was spent with patient more than half of which was spent in counselling about the above mentioned conditions  and coordination of care   I am having Louise R. Schrade start on doxycycline. I am also having her maintain her calcium carbonate, DAILY MULTIPLE VITAMINS PO, insulin  lispro, fluticasone, mometasone, insulin NPH Human, aspirin, nitroGLYCERIN, sodium chloride, AEROCHAMBER MV, budesonide-formoterol, irbesartan, co-enzyme Q-10, albuterol, carvedilol, atorvastatin, metolazone, and torsemide. We will continue to administer lactated ringers, ondansetron (ZOFRAN) 4 mg in sodium chloride 0.9 % 50 mL IVPB, albuterol, and traMADol.  Meds ordered this encounter  Medications  . doxycycline (VIBRA-TABS) 100 MG tablet    Sig: Take 1 tablet (100 mg total) by mouth 2 (two) times daily. With a full meal    Dispense:  20 tablet    Refill:  0    There are no discontinued medications.  Follow-up: No follow-ups on file.  Crecencio Mc, MD

## 2018-08-23 NOTE — Patient Instructions (Addendum)
You have a viral infection complicated by bronchitis.  You  Do not need steroids at  This time.  Continue using symbicort and albuterol   I am prescribing doxycycline to take 2 times daily WITH FOOD .     DO NOT force your cough or do things to aggravate  it (exercise).    Try  Taking  benadryl 25 mg one hour before bedtime to help dry up the post nasal drip   If you develop wheezing  Or dry persistent cough,  Call and we wil give you a steroid shot    Please take a probiotic ( Align, Floraque or Culturelle) or the generic version of one of these  For a minimum of 3 weeks to prevent a serious antibiotic associated diarrhea  Called clostridium dificile colitis     You received the flu vaccine today

## 2018-08-26 NOTE — Assessment & Plan Note (Addendum)
She has emphysematous changes on Lung Ca screening  Chest CT done in June, which was reviewed today with patient .  Will treat with  Doxycycline , prednisone taper,  Albuterol ,  Cough suppressant.

## 2018-09-07 ENCOUNTER — Ambulatory Visit (INDEPENDENT_AMBULATORY_CARE_PROVIDER_SITE_OTHER): Payer: Medicare Other

## 2018-09-07 VITALS — BP 132/64 | HR 72 | Temp 98.4°F | Resp 18 | Ht 59.0 in | Wt 196.4 lb

## 2018-09-07 DIAGNOSIS — L304 Erythema intertrigo: Secondary | ICD-10-CM | POA: Diagnosis not present

## 2018-09-07 DIAGNOSIS — Z1159 Encounter for screening for other viral diseases: Secondary | ICD-10-CM

## 2018-09-07 DIAGNOSIS — Z0000 Encounter for general adult medical examination without abnormal findings: Secondary | ICD-10-CM | POA: Diagnosis not present

## 2018-09-07 DIAGNOSIS — L853 Xerosis cutis: Secondary | ICD-10-CM | POA: Diagnosis not present

## 2018-09-07 NOTE — Patient Instructions (Addendum)
  Ms. Bellizzi , Thank you for taking time to come for your Medicare Wellness Visit. I appreciate your ongoing commitment to your health goals. Please review the following plan we discussed and let me know if I can assist you in the future.   Follow up as needed.    Bring a copy of your [place] and/or Living Will to be scanned into chart.  Have a great day!  These are the goals we discussed: Goals      Patient Stated   . Quit Smoking (pt-stated)     Smoke no more than 4 cigarettes per day, continue to decrease until reaching 0.       This is a list of the screening recommended for you and due dates:  Health Maintenance  Topic Date Due  .  Hepatitis C: One time screening is recommended by Center for Disease Control  (CDC) for  adults born from [birth year] through 1966.   24-Nov-1945  . Complete foot exam   11/06/2015  . Colon Cancer Screening  04/18/2017  . Hemoglobin A1C  03/08/2018  . Eye exam for diabetics  08/17/2018  . Mammogram  02/17/2019  . Tetanus Vaccine  11/05/2024  . Flu Shot  Completed  . DEXA scan (bone density measurement)  Completed  . Pneumonia vaccines  Completed

## 2018-09-07 NOTE — Progress Notes (Signed)
Subjective:   Andrea Stewart is a 72 y.o. female who presents for Medicare Annual (Subsequent) preventive examination.  Review of Systems:  No ROS.  Medicare Wellness Visit. Additional risk factors are reflected in the social history. Cardiac Risk Factors include: advanced age (>52men, >67 women);hypertension;diabetes mellitus     Objective:     Vitals: BP 132/64 (BP Location: Left Arm, Patient Position: Sitting, Cuff Size: Large)   Pulse 72   Temp 98.4 F (36.9 C) (Oral)   Resp 18   Ht 4\' 11"  (1.499 m)   Wt 196 lb 6.4 oz (89.1 kg)   SpO2 93%   BMI 39.67 kg/m   Body mass index is 39.67 kg/m.  Advanced Directives 09/07/2018 09/22/2017 09/08/2017 09/07/2017 08/04/2017 06/21/2017 07/19/2015  Does Patient Have a Medical Advance Directive? Yes Yes Yes Yes Yes Yes Yes  Type of Paramedic of [place];Living will [place];Out of facility DNR (pink MOST or yellow form) [place];Living will [place];Living will [place];Living will [place];Living will [place];Living will  Does patient want to make changes to medical advance directive? No - Patient declined No - Patient declined No - Patient declined No - Patient declined No - Patient declined - -  Copy of [place] in Chart? No - copy requested No - copy requested Yes Yes No - copy requested - No - copy requested    Tobacco Social History   Tobacco Use  Smoking Status Current Every Day Smoker  . Packs/day: 0.25  . Years: 30.00  . Pack years: 7.50  . Types: Cigarettes  Smokeless Tobacco Never Used  Tobacco Comment   currently smoking .25 ppd, trying to quit 02/13/16     Ready to quit: Not Answered Counseling given: Not Answered Comment: currently smoking .25 ppd, trying to quit 02/13/16   Clinical Intake:  Pre-visit preparation completed: Yes  Pain : No/denies  pain     Nutritional Status: BMI > 30  Obese Diabetes: Yes(Followed by Endocrinologist, [place])  How often do you need to have someone help you when you read instructions, pamphlets, or other written materials from your doctor or pharmacy?: 1 - Never  Interpreter Needed?: No     Past Medical History:  Diagnosis Date  . Backache, unspecified    s/p c-spine and l-spine fusions  . Degeneration of intervertebral disc, site unspecified   . Depressive disorder, not elsewhere classified   . Disorder of bone and cartilage, unspecified   . HTN (hypertension)   . Hx of left heart catheterization by cutdown    a. 7 years ago; no significant cad; b. Lexiscan 2014: no significant ischemia, no significant EKG changes concerning for ischemia, EF 67%, overall low risk study  . Kidney failure    Dr Cyndia Diver  . Myalgia and myositis, unspecified   . Obesity, unspecified   . Personal history of tobacco use, presenting hazards to health    1/2 ppd  . Plantar fascial fibromatosis   . S/P cholecystectomy   . S/p nephrectomy   . Type II or unspecified type diabetes mellitus without mention of complication, not stated as uncontrolled   . Unspecified essential hypertension   . Unspecified hereditary and idiopathic peripheral neuropathy    secondary to diabetes   Past Surgical History:  Procedure Laterality Date  . ABDOMINAL HYSTERECTOMY    . APPENDECTOMY    . BACK SURGERY  2006  . CARDIAC CATHETERIZATION     o.k. 2003  . CARPAL TUNNEL RELEASE    . CERVICAL DISCECTOMY    . CHOLECYSTECTOMY    . COLONOSCOPY  09/1999   colonoscopy-hemorrhoids, re-check 5 years (10/2006)  . dexa     osteopenia (01/2004)  . ELBOW SURGERY    . FOOT SURGERY     x 3  . KIDNEY DONATION  1991  . LUMBAR FUSION    . NECK SURGERY     06/2005  . Problem with general Anesthesia     02/2006  . REPLACEMENT TOTAL KNEE  10/2004  . trigger finger surgery    . VENTRAL HERNIA REPAIR N/A 09/08/2017    Primary repair of a 10 x 15 mm infraumbilical fascial defect.  Surgeon: Robert Bellow, MD;  Location: [facility] ORS;  Service: General;  Laterality: N/A;  . VESICOVAGINAL FISTULA CLOSURE W/ TAH    . VIDEO BRONCHOSCOPY Bilateral 04/02/2015   Procedure: VIDEO BRONCHOSCOPY WITHOUT FLUORO;  Surgeon: Juanito Doom, MD;  Location: [facility]COPY;  Service: Cardiopulmonary;  Laterality: Bilateral;   Family History  Problem Relation Age of Onset  . Hypertension Mother   . Colon cancer Mother   . Cancer Mother        colon  . Coronary artery disease Mother   . Diabetes Mother   . Prostate cancer Father   . COPD Father   . Cancer Father        prostate  . Heart attack Brother 80  . Sleep apnea Brother   . Diabetes Brother   . Diabetes Paternal Aunt   . Diabetes Paternal Uncle   . Cancer Daughter        Brain, lungs, liver, spine, kidney   Social History   Socioeconomic History  . Marital status: Married    Spouse name: Not on file  . Number of children: 4  . Years of education: Not on file  . Highest education level: Not on file  Occupational History  . Occupation: Retired  Scientific laboratory technician  . Financial resource strain: Not hard at all  . Food insecurity:    Worry: Never true    Inability: Never true  . Transportation needs:    Medical: No    Non-medical: No  Tobacco Use  . Smoking status: Current Every Day Smoker    Packs/day: 0.25    Years: 30.00    Pack years: 7.50    Types: Cigarettes  . Smokeless tobacco: Never Used  . Tobacco comment: currently smoking .25 ppd, trying to quit 02/13/16  Substance and Sexual Activity  . Alcohol use: No    Alcohol/week: 0.0 standard drinks  . Drug use: No  . Sexual activity: Never  Lifestyle  . Physical activity:    Days per week: 0 days    Minutes per session: Not on file  . Stress: Not on file  Relationships  . Social connections:    Talks on phone: Not on file    Gets together: Not on file    Attends religious service: Not on  file    Active member of club or organization: Not on file    Attends meetings of clubs or organizations: Not on file    Relationship status: Not on file  Other Topics Concern  . Not on file  Social History Narrative   Lives in [place]. Disability because of back    Outpatient Encounter Medications as of 09/07/2018  Medication Sig  . albuterol (  PROVENTIL HFA) 108 (90 Base) MCG/ACT inhaler Inhale 2 puffs every 4 (four) hours as needed into the lungs for wheezing or shortness of breath.  Marland Kitchen amLODipine (NORVASC) 10 MG tablet TAKE 1 TABLET BY MOUTH DAILY  . aspirin 81 MG tablet Take 1 tablet (81 mg total) by mouth daily.  Marland Kitchen atorvastatin (LIPITOR) 20 MG tablet TAKE 1 TABLET BY MOUTH EVERY DAY  . budesonide-formoterol (SYMBICORT) 160-4.5 MCG/ACT inhaler Inhale 2 puffs into the lungs 2 (two) times daily.  . calcium carbonate (OS-CAL) 600 MG TABS Take 600 mg by mouth daily.    . carvedilol (COREG) 6.25 MG tablet Take 1 tablet (6.25 mg total) by mouth daily.  Marland Kitchen co-enzyme Q-10 30 MG capsule Take 30 mg by mouth daily.  Marland Kitchen DAILY MULTIPLE VITAMINS PO Take 1 tablet by mouth daily.    Marland Kitchen doxycycline (VIBRA-TABS) 100 MG tablet Take 1 tablet (100 mg total) by mouth 2 (two) times daily. With a full meal  . fluticasone (VERAMYST) 27.5 MCG/SPRAY nasal spray Place 2 sprays into the nose as needed for rhinitis or allergies.   Marland Kitchen insulin lispro (HUMALOG) 100 UNIT/ML injection Inject into the skin as directed. Sliding scale 100-150 = 2-2.5 units 150-200 = 3-3.5 units 200-250 = 4-4.5 units 251-300 = 5-5.5 units 301-350 = 6-6.5 units 350-400 = 7-7.5 units  . insulin NPH (HUMULIN N,NOVOLIN N) 100 UNIT/ML injection Inject 22 Units into the skin daily before breakfast. 24 units at bedtime  . irbesartan (AVAPRO) 150 MG tablet Take 150 mg by mouth 2 (two) times daily.   . metolazone (ZAROXOLYN) 2.5 MG tablet TAKE 1 TABLET BY MOUTH DAILY 30 MINUTES PRIOR TO TORSEMIDE  . mometasone (NASONEX) 50 MCG/ACT nasal spray  Place 2 sprays into the nose daily as needed.   . nitroGLYCERIN (NITROSTAT) 0.4 MG SL tablet Place 1 tablet (0.4 mg total) under the tongue every 5 (five) minutes as needed. Maximum 3 tablets (Patient taking differently: Place 0.4 mg under the tongue every 5 (five) minutes as needed for chest pain. Maximum 3 tablets)  . sodium chloride (OCEAN) 0.65 % SOLN nasal spray Place 2 sprays into both nostrils as needed for congestion.  Marland Kitchen Spacer/Aero-Holding Chambers (AEROCHAMBER MV) inhaler Use as instructed  . torsemide (DEMADEX) 5 MG tablet Take 1 tablet (5 mg total) by mouth daily.  . [DISCONTINUED] valsartan-hydrochlorothiazide (DIOVAN-HCT) 320-25 MG per tablet TAKE ONE (1) TABLET EACH DAY   Facility-Administered Encounter Medications as of 09/07/2018  Medication  . albuterol (PROVENTIL HFA;VENTOLIN HFA) 108 (90 Base) MCG/ACT inhaler 2 puff  . lactated ringers infusion  . ondansetron (ZOFRAN) 4 mg in sodium chloride 0.9 % 50 mL IVPB  . traMADol (ULTRAM) tablet 50 mg    Activities of Daily Living In your present state of health, do you have any difficulty performing the following activities: 09/07/2018 09/07/2017  Hearing? N N  Vision? N N  Difficulty concentrating or making decisions? N N  Walking or climbing stairs? Y N  Comment Unsteady gait.  Cane in use.  -  Dressing or bathing? N N  Doing errands, shopping? N N  Preparing Food and eating ? N -  Using the Toilet? N -  In the past six months, have you accidently leaked urine? Y -  Comment Managed with a daily pad -  Do you have problems with loss of bowel control? N -  Managing your Medications? N -  Managing your Finances? N -  Housekeeping or managing your Housekeeping? Y -  Comment Husband  assists -  Some recent data might be hidden    Patient Care Team: Crecencio Mc, MD as PCP - General (Internal Medicine) Jacelyn Pi, MD as Consulting Physician (Endocrinology) Minna Merritts, MD as Consulting Physician  (Cardiology) Carmelia Roller, MD as Referring Physician (Internal Medicine) Bary Castilla Forest Gleason, MD (General Surgery)    Assessment:   This is a routine wellness examination for Desta.  The goal of the wellness visit is to assist the patient how to close the gaps in care and create a preventative care plan for the patient.   The roster of all physicians providing medical care to patient is listed in the Snapshot section of the chart.  Osteoporosis risk reviewed.    Safety issues reviewed; Smoke and carbon monoxide detectors in the home. No firearms in the home. Wears seatbelts when driving or riding with others. No violence in the home.  They do not have excessive sun exposure.  Discussed the need for sun protection: hats, long sleeves and the use of sunscreen if there is significant sun exposure.  Patient is alert, normal appearance, oriented to person/place/and time. Correctly identified the president of the [place] and recalls of 3/3 words.Performs simple calculations and can read correct time from watch face.  Displays appropriate judgement.  No new identified risk were noted.  No failures at ADL's or IADL's.  Ambulates with cane.   BMI- discussed the importance of a healthy diet, water intake and the benefits of aerobic exercise. She tries to have a healthy diet; brittle diabetic.  Active around the home with walking. Adequate water intake.   Dental- every 12 months.  Eye- Diabetic eye exam schedule next month.  Wears corrective lenses.  Sleep patterns- Sleeps about 5 -6 hours at night.   Hepatitis C Screening consent given.   Diabetes- followed by Endocrinology.  [facility].   Patient Concerns: None at this time. Follow up with PCP as needed.  Exercise Activities and Dietary recommendations Current Exercise Habits: The patient does not participate in regular exercise at present  Goals      Patient Stated   . Quit Smoking (pt-stated)     Smoke no more than  4 cigarettes per day, continue to decrease until reaching 0.       Fall Risk Fall Risk  09/07/2018 08/04/2017 09/16/2016 07/16/2016 11/05/2014  Falls in the past year? No No No No No  Comment - - - Emmi Telephone Survey: data to providers prior to load -   Depression Screen PHQ 2/9 Scores 09/07/2018 08/04/2017 09/16/2016 11/05/2014  PHQ - 2 Score 0 0 0 0  PHQ- 9 Score - 0 - -     Cognitive Function     6CIT Screen 09/07/2018 08/04/2017  What Year? 0 points 0 points  What month? 0 points 0 points  What time? 0 points 0 points  Count back from 20 0 points 0 points  Months in reverse 0 points 0 points  Repeat phrase 0 points 0 points  Total Score 0 0    Immunization History  Administered Date(s) Administered  . Influenza Split 09/01/2011, 09/13/2016  . Influenza Whole 11/02/2005  . Influenza, High Dose Seasonal PF 08/25/2017, 08/23/2018  . Influenza,inj,Quad PF,6+ Mos 07/26/2013, 09/24/2014, 08/11/2015  . Influenza-Unspecified 09/07/2012  . Pneumococcal Conjugate-13 03/01/2014  . Pneumococcal Polysaccharide-23 03/01/2006, 08/04/2017  . Td 11/15/2000  . Tdap 11/05/2014  . Zoster 03/01/2012   Screening Tests Health Maintenance  Topic Date Due  . Hepatitis C Screening  [birth year]  . FOOT EXAM  11/06/2015  . COLONOSCOPY  04/18/2017  . HEMOGLOBIN A1C  03/08/2018  . OPHTHALMOLOGY EXAM  08/17/2018  . MAMMOGRAM  02/17/2019  . TETANUS/TDAP  11/05/2024  . INFLUENZA VACCINE  Completed  . DEXA SCAN  Completed  . PNA vac Low Risk Adult  Completed      Plan:   End of life planning; Advance aging; Advanced directives discussed. Copy of current HCPOA/Living Will requested.    I have personally reviewed and noted the following in the patient's chart:   . Medical and social history . Use of alcohol, tobacco or illicit drugs  . Current medications and supplements . Functional ability and status . Nutritional status . Physical activity . Advanced directives . List of other  physicians . Hospitalizations, surgeries, and ER visits in previous 12 months . Vitals . Screenings to include cognitive, depression, and falls . Referrals and appointments  In addition, I have reviewed and discussed with patient certain preventive protocols, quality metrics, and best practice recommendations. A written personalized care plan for preventive services as well as general preventive health recommendations were provided to patient.     OBrien-Blaney, Denisa L, LPN  89/21/1942     I have reviewed the above information and agree with above.   Deborra Medina, MD

## 2018-09-25 DIAGNOSIS — E119 Type 2 diabetes mellitus without complications: Secondary | ICD-10-CM | POA: Diagnosis not present

## 2018-09-28 DIAGNOSIS — E7800 Pure hypercholesterolemia, unspecified: Secondary | ICD-10-CM | POA: Diagnosis not present

## 2018-09-28 DIAGNOSIS — E1165 Type 2 diabetes mellitus with hyperglycemia: Secondary | ICD-10-CM | POA: Diagnosis not present

## 2018-09-28 DIAGNOSIS — I10 Essential (primary) hypertension: Secondary | ICD-10-CM | POA: Diagnosis not present

## 2018-09-28 DIAGNOSIS — R809 Proteinuria, unspecified: Secondary | ICD-10-CM | POA: Diagnosis not present

## 2018-09-28 DIAGNOSIS — G609 Hereditary and idiopathic neuropathy, unspecified: Secondary | ICD-10-CM | POA: Diagnosis not present

## 2018-09-29 ENCOUNTER — Telehealth: Payer: Self-pay | Admitting: Pulmonary Disease

## 2018-09-29 NOTE — Telephone Encounter (Signed)
I have checked BQ's look at. This document is not located there. I attempted to call Amber but I could not get the call to go through. Will try back.

## 2018-10-03 NOTE — Telephone Encounter (Signed)
Called and spoke with Miracle from the [facility]. I advised her that we have still not received the fax for surgical clearance. I also advised her that BQ is not in our [place] office today but he was in our [place] office and it could be faxed there. She stated that the patient was not feeling well and the colonoscopy would be rescheduled. She will let us know when it will be rescheduled to. Will await on fax.

## 2018-10-03 NOTE — Telephone Encounter (Signed)
Scheduled for colonoscopy tomorrow and needs surgical clearance as soon as possible.  Andrea Stewart with [facility], Dr. Vira Agar, is re-faxing the form now.  CB is [phone].

## 2018-10-04 ENCOUNTER — Ambulatory Visit
Admission: RE | Admit: 2018-10-04 | Payer: Medicare Other | Source: Ambulatory Visit | Admitting: Unknown Physician Specialty

## 2018-10-04 ENCOUNTER — Encounter: Admission: RE | Payer: Self-pay | Source: Ambulatory Visit

## 2018-10-04 SURGERY — COLONOSCOPY WITH PROPOFOL
Anesthesia: General

## 2018-10-17 ENCOUNTER — Other Ambulatory Visit: Payer: Self-pay | Admitting: Internal Medicine

## 2018-10-25 DIAGNOSIS — E1122 Type 2 diabetes mellitus with diabetic chronic kidney disease: Secondary | ICD-10-CM | POA: Diagnosis not present

## 2018-10-25 DIAGNOSIS — R319 Hematuria, unspecified: Secondary | ICD-10-CM | POA: Diagnosis not present

## 2018-10-25 DIAGNOSIS — R768 Other specified abnormal immunological findings in serum: Secondary | ICD-10-CM | POA: Diagnosis not present

## 2018-10-25 DIAGNOSIS — I10 Essential (primary) hypertension: Secondary | ICD-10-CM | POA: Diagnosis not present

## 2018-10-25 DIAGNOSIS — R809 Proteinuria, unspecified: Secondary | ICD-10-CM | POA: Diagnosis not present

## 2018-10-25 DIAGNOSIS — N183 Chronic kidney disease, stage 3 (moderate): Secondary | ICD-10-CM | POA: Diagnosis not present

## 2018-10-25 DIAGNOSIS — Z524 Kidney donor: Secondary | ICD-10-CM | POA: Diagnosis not present

## 2018-10-25 DIAGNOSIS — E8809 Other disorders of plasma-protein metabolism, not elsewhere classified: Secondary | ICD-10-CM | POA: Diagnosis not present

## 2018-10-25 DIAGNOSIS — D631 Anemia in chronic kidney disease: Secondary | ICD-10-CM | POA: Diagnosis not present

## 2018-10-30 DIAGNOSIS — I129 Hypertensive chronic kidney disease with stage 1 through stage 4 chronic kidney disease, or unspecified chronic kidney disease: Secondary | ICD-10-CM | POA: Diagnosis not present

## 2018-10-30 DIAGNOSIS — E1122 Type 2 diabetes mellitus with diabetic chronic kidney disease: Secondary | ICD-10-CM | POA: Diagnosis not present

## 2018-10-30 DIAGNOSIS — R809 Proteinuria, unspecified: Secondary | ICD-10-CM | POA: Diagnosis not present

## 2018-10-30 DIAGNOSIS — N183 Chronic kidney disease, stage 3 (moderate): Secondary | ICD-10-CM | POA: Diagnosis not present

## 2018-11-28 ENCOUNTER — Ambulatory Visit (INDEPENDENT_AMBULATORY_CARE_PROVIDER_SITE_OTHER): Payer: PPO | Admitting: Family Medicine

## 2018-11-28 ENCOUNTER — Encounter: Payer: Self-pay | Admitting: Family Medicine

## 2018-11-28 VITALS — BP 162/80 | HR 77 | Temp 99.1°F | Resp 20 | Ht 60.0 in | Wt 202.0 lb

## 2018-11-28 DIAGNOSIS — R0989 Other specified symptoms and signs involving the circulatory and respiratory systems: Secondary | ICD-10-CM

## 2018-11-28 DIAGNOSIS — Z720 Tobacco use: Secondary | ICD-10-CM | POA: Diagnosis not present

## 2018-11-28 DIAGNOSIS — R05 Cough: Secondary | ICD-10-CM

## 2018-11-28 DIAGNOSIS — J209 Acute bronchitis, unspecified: Secondary | ICD-10-CM

## 2018-11-28 DIAGNOSIS — J440 Chronic obstructive pulmonary disease with acute lower respiratory infection: Secondary | ICD-10-CM

## 2018-11-28 DIAGNOSIS — R059 Cough, unspecified: Secondary | ICD-10-CM

## 2018-11-28 MED ORDER — BENZONATATE 100 MG PO CAPS: 100.0000 mg | ORAL_CAPSULE | Freq: Three times a day (TID) | ORAL | 0 refills | Status: DC | PRN

## 2018-11-28 MED ORDER — METHYLPREDNISOLONE ACETATE 40 MG/ML IJ SUSP
40.0000 mg | Freq: Once | INTRAMUSCULAR | Status: AC
  Administered 2018-11-28: 40 mg via INTRAMUSCULAR

## 2018-11-28 MED ORDER — AZITHROMYCIN 250 MG PO TABS: ORAL_TABLET | ORAL | 0 refills | Status: DC

## 2018-11-28 NOTE — Progress Notes (Signed)
Subjective:    Patient ID: Andrea Stewart, female    DOB: [birth year], 73 y.o.   MRN: [id number]  HPI   Patient presents to clinic complaining of cough, chest congestion for the past 3 to 4 days.  Patient has been using her rescue inhaler more often, and has been taking "rockin-rye" to improve cough-combination of lemon juice, honey and some liquor.  Patient states the "rockin-rye" does work well to improve cough.  Patient continues to smoke 1/4 PPD  Patient Active Problem List   Diagnosis Date Noted  . PAD (peripheral artery disease) ([place]) 05/30/2018  . Family history of colon cancer requiring screening colonoscopy 04/06/2018  . Incarcerated ventral hernia 09/07/2017  . Ventral hernia 09/07/2017  . Atherosclerosis of native arteries of extremity with intermittent claudication ([place]) 06/21/2017  . Morbid obesity ([place]) 10/21/2016  . Acute bronchitis with COPD ([place]) 09/16/2016  . Edema 06/19/2016  . Tobacco abuse counseling 12/17/2015  . Tobacco use disorder 12/11/2015  . CKD (chronic kidney disease), stage III ([place]) 05/13/2015  . COPD (chronic obstructive pulmonary disease) with chronic bronchitis ([place]) 01/21/2015  . Hx of left heart catheterization by cutdown   . Single kidney 09/30/2014  . Irritable bowel syndrome 09/09/2012  . Coronary atherosclerosis 07/26/2009  . Diabetes mellitus type 2 with complications, uncontrolled ([place]) 03/09/2007  . DEPRESSION 03/09/2007  . PERIPHERAL NEUROPATHY 03/09/2007  . Essential hypertension 03/09/2007  . DEGENERATIVE DISC DISEASE 03/09/2007  . BACK PAIN, CHRONIC 03/09/2007  . FIBROMYALGIA 03/09/2007   Social History   Tobacco Use  . Smoking status: Current Every Day Smoker    Packs/day: 0.25    Years: 30.00    Pack years: 7.50    Types: Cigarettes  . Smokeless tobacco: Never Used  . Tobacco comment: currently smoking .25 ppd, trying to quit 02/13/16  Substance Use Topics  . Alcohol use: No    Alcohol/week: 0.0 standard drinks    Review of Systems   Constitutional: Negative for chills, fatigue and fever.  HENT: Negative for congestion, ear pain, sinus pain and sore throat.   Eyes: Negative.   Respiratory: +cough, chest congestion, shortness of breath and wheezing.   Cardiovascular: Negative for chest pain, palpitations and leg swelling.  Gastrointestinal: Negative for abdominal pain, diarrhea, nausea and vomiting.  Genitourinary: Negative for dysuria, frequency and urgency.  Musculoskeletal: Negative for arthralgias and myalgias.  Skin: Negative for color change, pallor and rash.  Neurological: Negative for syncope, light-headedness and headaches.  Psychiatric/Behavioral: The patient is not nervous/anxious.       Objective:   Physical Exam Vitals signs and nursing note reviewed.  Constitutional:      General: She is not in acute distress.    Appearance: She is not diaphoretic.  HENT:     Head: Normocephalic and atraumatic.     Nose: Nose normal.     Mouth/Throat:     Mouth: Mucous membranes are moist.  Eyes:     General: No scleral icterus.    Extraocular Movements: Extraocular movements intact.     Conjunctiva/sclera: Conjunctivae normal.  Neck:     Musculoskeletal: Neck supple.  Cardiovascular:     Rate and Rhythm: Normal rate and regular rhythm.  Pulmonary:     Effort: Pulmonary effort is normal. No respiratory distress.     Breath sounds: Wheezing and rhonchi present.     Comments: Harsh raspy cough Musculoskeletal:     Right lower leg: No edema.     Left lower leg: No edema.  Lymphadenopathy:     Cervical: No cervical adenopathy.  Skin:    General: Skin is warm and dry.     Coloration: Skin is not pale.  Neurological:     Mental Status: She is alert and oriented to person, place, and time.     Gait: Gait normal.  Psychiatric:        Mood and Affect: Mood normal.        Behavior: Behavior normal.    Vitals:   11/28/18 1612  BP: (!) 162/80  Pulse: 77  Resp: 20  Temp: 99.1 F  (37.3 C)  SpO2: 91%      Assessment & Plan:   Acute bronchitis with COPD, cough, chest congestion-patient will take Z-Pak.  She will also get one-time 40 mg IM steroid shot in clinic today.  Patient would prefer not to have to take oral steroid taper as it makes her blood sugars become very elevated.  Patient will continue to use her inhaler at home, states she does not require any inhaler refill right now.  Patient also prescribed Tessalon Per[place] to use as needed for cough, states she will continue to use her "rockin-rye" as it is helpful for cough especially before bedtime.  Administrations This Visit    methylPREDNISolone acetate (DEPO-MEDROL) injection 40 mg    Admin Date 11/28/2018 Action Given Dose 40 mg Route Intramuscular Administered By Neta Ehlers, RMA          Patient will follow-up here in 1 week to be sure she is improving.  Advised that if she is not improving at that time we will proceed forward with a chest x-ray and possibly course of Levaquin if required.

## 2018-12-01 ENCOUNTER — Ambulatory Visit (INDEPENDENT_AMBULATORY_CARE_PROVIDER_SITE_OTHER): Payer: Medicare Other | Admitting: Nurse Practitioner

## 2018-12-01 ENCOUNTER — Encounter (INDEPENDENT_AMBULATORY_CARE_PROVIDER_SITE_OTHER): Payer: Medicare Other

## 2018-12-05 ENCOUNTER — Ambulatory Visit: Payer: PPO | Admitting: Family Medicine

## 2018-12-13 ENCOUNTER — Ambulatory Visit (INDEPENDENT_AMBULATORY_CARE_PROVIDER_SITE_OTHER): Payer: No Typology Code available for payment source | Admitting: Nurse Practitioner

## 2018-12-13 ENCOUNTER — Encounter (INDEPENDENT_AMBULATORY_CARE_PROVIDER_SITE_OTHER): Payer: No Typology Code available for payment source

## 2018-12-13 DIAGNOSIS — E1165 Type 2 diabetes mellitus with hyperglycemia: Secondary | ICD-10-CM | POA: Diagnosis not present

## 2018-12-13 DIAGNOSIS — M47816 Spondylosis without myelopathy or radiculopathy, lumbar region: Secondary | ICD-10-CM | POA: Diagnosis not present

## 2018-12-25 ENCOUNTER — Ambulatory Visit (INDEPENDENT_AMBULATORY_CARE_PROVIDER_SITE_OTHER): Payer: PPO

## 2018-12-25 ENCOUNTER — Ambulatory Visit (INDEPENDENT_AMBULATORY_CARE_PROVIDER_SITE_OTHER): Payer: PPO | Admitting: Nurse Practitioner

## 2018-12-25 ENCOUNTER — Other Ambulatory Visit: Payer: Self-pay | Admitting: Internal Medicine

## 2018-12-25 ENCOUNTER — Encounter (INDEPENDENT_AMBULATORY_CARE_PROVIDER_SITE_OTHER): Payer: Self-pay | Admitting: Nurse Practitioner

## 2018-12-25 VITALS — BP 170/82 | HR 77 | Resp 18 | Ht 62.0 in | Wt 200.0 lb

## 2018-12-25 DIAGNOSIS — I70219 Atherosclerosis of native arteries of extremities with intermittent claudication, unspecified extremity: Secondary | ICD-10-CM

## 2018-12-25 DIAGNOSIS — F17210 Nicotine dependence, cigarettes, uncomplicated: Secondary | ICD-10-CM

## 2018-12-25 DIAGNOSIS — J449 Chronic obstructive pulmonary disease, unspecified: Secondary | ICD-10-CM | POA: Diagnosis not present

## 2018-12-25 DIAGNOSIS — I739 Peripheral vascular disease, unspecified: Secondary | ICD-10-CM | POA: Diagnosis not present

## 2018-12-25 DIAGNOSIS — I10 Essential (primary) hypertension: Secondary | ICD-10-CM

## 2018-12-25 NOTE — Progress Notes (Signed)
Subjective:    Patient ID: Andrea Stewart, female    DOB: [birth year], 73 y.o.   MRN: [id number] Chief Complaint  Patient presents with  . Follow-up    HPI  Andrea Stewart is a 73 y.o. female The patient returns to the office for followup and review of the noninvasive studies. There have been no interval changes in lower extremity symptoms. No interval shortening of the patient's claudication distance or development of rest pain symptoms. No new ulcers or wounds have occurred since the last visit.  There have been no significant changes to the patient's overall health care.  The patient denies amaurosis fugax or recent TIA symptoms. There are no recent neurological changes noted. The patient denies history of DVT, PE or superficial thrombophlebitis. The patient denies recent episodes of angina or shortness of breath.   ABI Rt=0.88 and Lt=0.72  (previous ABI's Rt=0.75 and Lt=0.80) The patient had biphasic tibial artery waveforms bilaterally.  Past Medical History:  Diagnosis Date  . Backache, unspecified    s/p c-spine and l-spine fusions  . Degeneration of intervertebral disc, site unspecified   . Depressive disorder, not elsewhere classified   . Disorder of bone and cartilage, unspecified   . HTN (hypertension)   . Hx of left heart catheterization by cutdown    a. 7 years ago; no significant cad; b. Lexiscan 2014: no significant ischemia, no significant EKG changes concerning for ischemia, EF 67%, overall low risk study  . Kidney failure    Dr Cyndia Diver  . Myalgia and myositis, unspecified   . Obesity, unspecified   . Personal history of tobacco use, presenting hazards to health    1/2 ppd  . Plantar fascial fibromatosis   . S/P cholecystectomy   . S/p nephrectomy   . Type II or unspecified type diabetes mellitus without mention of complication, not stated as uncontrolled   . Unspecified essential hypertension   . Unspecified hereditary and idiopathic peripheral  neuropathy    secondary to diabetes    Past Surgical History:  Procedure Laterality Date  . ABDOMINAL HYSTERECTOMY    . APPENDECTOMY    . BACK SURGERY     2006  . CARDIAC CATHETERIZATION     o.k. 2003  . CARPAL TUNNEL RELEASE    . CERVICAL DISCECTOMY    . CHOLECYSTECTOMY    . COLONOSCOPY  09/1999   colonoscopy-hemorrhoids, re-check 5 years (10/2006)  . dexa     osteopenia (01/2004)  . ELBOW SURGERY    . FOOT SURGERY     x 3  . KIDNEY DONATION  1991  . LUMBAR FUSION    . NECK SURGERY     06/2005  . Problem with general Anesthesia     02/2006  . REPLACEMENT TOTAL KNEE  10/2004  . trigger finger surgery    . VENTRAL HERNIA REPAIR N/A 09/08/2017   Primary repair of a 10 x 15 mm infraumbilical fascial defect.  Surgeon: Robert Bellow, MD;  Location: [facility] ORS;  Service: General;  Laterality: N/A;  . VESICOVAGINAL FISTULA CLOSURE W/ TAH    . VIDEO BRONCHOSCOPY Bilateral 04/02/2015   Procedure: VIDEO BRONCHOSCOPY WITHOUT FLUORO;  Surgeon: Juanito Doom, MD;  Location: [facility];  Service: Cardiopulmonary;  Laterality: Bilateral;    Social History   Socioeconomic History  . Marital status: Married    Spouse name: Not on file  . Number of children: 4  . Years of education: Not on file  . Highest education level:  Not on file  Occupational History  . Occupation: Retired  Scientific laboratory technician  . Financial resource strain: Not hard at all  . Food insecurity:    Worry: Never true    Inability: Never true  . Transportation needs:    Medical: No    Non-medical: No  Tobacco Use  . Smoking status: Current Every Day Smoker    Packs/day: 0.25    Years: 30.00    Pack years: 7.50    Types: Cigarettes  . Smokeless tobacco: Never Used  . Tobacco comment: currently smoking .25 ppd, trying to quit 02/13/16  Substance and Sexual Activity  . Alcohol use: No    Alcohol/week: 0.0 standard drinks  . Drug use: No  . Sexual activity: Never  Lifestyle  . Physical activity:    Days  per week: 0 days    Minutes per session: Not on file  . Stress: Not on file  Relationships  . Social connections:    Talks on phone: Not on file    Gets together: Not on file    Attends religious service: Not on file    Active member of club or organization: Not on file    Attends meetings of clubs or organizations: Not on file    Relationship status: Not on file  . Intimate partner violence:    Fear of current or ex partner: No    Emotionally abused: No    Physically abused: No    Forced sexual activity: No  Other Topics Concern  . Not on file  Social History Narrative   Lives in [place]. Disability because of back    Family History  Problem Relation Age of Onset  . Hypertension Mother   . Colon cancer Mother   . Cancer Mother        colon  . Coronary artery disease Mother   . Diabetes Mother   . Prostate cancer Father   . COPD Father   . Cancer Father        prostate  . Heart attack Brother 63  . Sleep apnea Brother   . Diabetes Brother   . Diabetes Paternal Aunt   . Diabetes Paternal Uncle   . Cancer Daughter        Brain, lungs, liver, spine, kidney    Allergies  Allergen Reactions  . Morphine Anaphylaxis  . Prednisone Other (See Comments)    "makes me think I am having a heart attack"  . Gabapentin Other (See Comments)    intolerance  . Nsaids Diarrhea  . Penicillins Swelling    Has patient had a PCN reaction causing immediate rash, facial/tongue/throat swelling, SOB or lightheadedness with hypotension: Yes Has patient had a PCN reaction causing severe rash involving mucus membranes or skin necrosis: No Has patient had a PCN reaction that required hospitalization: No Has patient had a PCN reaction occurring within the last 10 years: No If all of the above answers are "NO", then may proceed with Cephalosporin use.      Review of Systems   Review of Systems: Negative Unless Checked Constitutional: [] Weight loss  [] Fever  [] Chills Cardiac: [] Chest  pain   []  Atrial Fibrillation  [] Palpitations   [] Shortness of breath when laying flat   [] Shortness of breath with exertion. [] Shortness of breath at rest Vascular:  [] Pain in legs with walking   [] Pain in legs with standing [] Pain in legs when laying flat   [x] Claudication    [] Pain in feet when laying flat    []   History of DVT   [] Phlebitis   [] Swelling in legs   [] Varicose veins   [] Non-healing ulcers Pulmonary:   [] Uses home oxygen   [] Productive cough   [] Hemoptysis   [] Wheeze  [] COPD   [] Asthma Neurologic:  [] Dizziness   [] Seizures  [] Blackouts [] History of stroke   [] History of TIA  [] Aphasia   [] Temporary Blindness   [] Weakness or numbness in arm   [] Weakness or numbness in leg Musculoskeletal:   [] Joint swelling   [x] Joint pain   [] Low back pain  []  History of Knee Replacement [] Arthritis [] back Surgeries  []  Spinal Stenosis    Hematologic:  [] Easy bruising  [] Easy bleeding   [] Hypercoagulable state   [] Anemic Gastrointestinal:  [] Diarrhea   [] Vomiting  [] Gastroesophageal reflux/heartburn   [] Difficulty swallowing. [] Abdominal pain Genitourinary:  [] Chronic kidney disease   [] Difficult urination  [] Anuric   [] Blood in urine [] Frequent urination  [] Burning with urination   [] Hematuria Skin:  [] Rashes   [] Ulcers [] Wounds Psychological:  [] History of anxiety   []  History of major depression  []  Memory Difficulties     Objective:   Physical Exam  BP (!) 170/82 (BP Location: Left Arm, Patient Position: Sitting)   Pulse 77   Resp 18   Ht 5\' 2"  (1.575 m)   Wt 200 lb (90.7 kg)   BMI 36.58 kg/m   Gen: WD/WN, NAD Head: [place]/AT, No temporalis wasting.  Ear/Nose/Throat: Hearing grossly intact, nares w/o erythema or drainage Eyes: PER, EOMI, sclera nonicteric.  Neck: Supple, no masses.  No JVD.  Pulmonary:  Good air movement, no use of accessory muscles.  Cardiac: RRR Vascular:  Vessel Right Left  Radial Palpable Palpable  Dorsalis Pedis Palpable Palpable  Posterior Tibial Palpable  Palpable   Gastrointestinal: soft, non-distended. No guarding/no peritoneal signs.  Musculoskeletal: Uses cane for ambulation No deformity or atrophy.  Neurologic: Pain and light touch intact in extremities.  Symmetrical.  Speech is fluent. Motor exam as listed above. Psychiatric: Judgment intact, Mood & affect appropriate for pt's clinical situation. Dermatologic: No Venous rashes. No Ulcers Noted.  No changes consistent with cellulitis. Lymph : No Cervical lymphadenopathy, no lichenification or skin changes of chronic lymphedema.      Assessment & Plan:   1. Atherosclerosis of native artery of extremity with intermittent claudication, unspecified extremity ([place])  Recommend:  The patient has evidence of atherosclerosis of the lower extremities with claudication.  The patient does not voice lifestyle limiting changes at this point in time.  Noninvasive studies do not suggest clinically significant change.  No invasive studies, angiography or surgery at this time The patient should continue walking and begin a more formal exercise program.  The patient should continue antiplatelet therapy and aggressive treatment of the lipid abnormalities  No changes in the patient's medications at this time  The patient should continue wearing graduated compression socks 10-15 mmHg strength to control the mild edema.   - VAS [place] ABI WITH/WO TBI; Future  2. Essential hypertension Continue antihypertensive medications as already ordered, these medications have been reviewed and there are no changes at this time.   3. COPD (chronic obstructive pulmonary disease) with chronic bronchitis (HCC) Continue pulmonary medications and aerosols as already ordered, these medications have been reviewed and there are no changes at this time.     Current Outpatient Medications on File Prior to Visit  Medication Sig Dispense Refill  . albuterol (PROVENTIL HFA) 108 (90 Base) MCG/ACT inhaler Inhale 2 puffs  every 4 (four) hours as needed into  the lungs for wheezing or shortness of breath. 1 Inhaler 2  . amLODipine (NORVASC) 10 MG tablet TAKE 1 TABLET BY MOUTH DAILY 90 tablet 1  . aspirin 81 MG tablet Take 1 tablet (81 mg total) by mouth daily.    Marland Kitchen atorvastatin (LIPITOR) 20 MG tablet TAKE 1 TABLET BY MOUTH EVERY DAY 90 tablet 1  . budesonide-formoterol (SYMBICORT) 160-4.5 MCG/ACT inhaler Inhale 2 puffs into the lungs 2 (two) times daily. 1 Inhaler 5  . calcium carbonate (OS-CAL) 600 MG TABS Take 600 mg by mouth daily.      . carvedilol (COREG) 6.25 MG tablet Take 1 tablet (6.25 mg total) by mouth daily. 90 tablet 1  . co-enzyme Q-10 30 MG capsule Take 30 mg by mouth daily.    Marland Kitchen DAILY MULTIPLE VITAMINS PO Take 1 tablet by mouth daily.      . fluticasone (VERAMYST) 27.5 MCG/SPRAY nasal spray Place 2 sprays into the nose as needed for rhinitis or allergies.     Marland Kitchen insulin lispro (HUMALOG) 100 UNIT/ML injection Inject into the skin as directed. Sliding scale 100-150 = 2-2.5 units 150-200 = 3-3.5 units 200-250 = 4-4.5 units 251-300 = 5-5.5 units 301-350 = 6-6.5 units 350-400 = 7-7.5 units    . insulin NPH (HUMULIN N,NOVOLIN N) 100 UNIT/ML injection Inject 22 Units into the skin daily before breakfast. 24 units at bedtime    . irbesartan (AVAPRO) 150 MG tablet Take 150 mg by mouth 2 (two) times daily.     . mometasone (NASONEX) 50 MCG/ACT nasal spray Place 2 sprays into the nose daily as needed.     . nitroGLYCERIN (NITROSTAT) 0.4 MG SL tablet Place 1 tablet (0.4 mg total) under the tongue every 5 (five) minutes as needed. Maximum 3 tablets (Patient taking differently: Place 0.4 mg under the tongue every 5 (five) minutes as needed for chest pain. Maximum 3 tablets) 25 tablet 6  . sodium chloride (OCEAN) 0.65 % SOLN nasal spray Place 2 sprays into both nostrils as needed for congestion.    Marland Kitchen Spacer/Aero-Holding Chambers (AEROCHAMBER MV) inhaler Use as instructed 1 each 0  . torsemide (DEMADEX) 5 MG  tablet TAKE 1 TABLET BY MOUTH DAILY 90 tablet 1  . [DISCONTINUED] valsartan-hydrochlorothiazide (DIOVAN-HCT) 320-25 MG per tablet TAKE ONE (1) TABLET EACH DAY 30 tablet 6   Current Facility-Administered Medications on File Prior to Visit  Medication Dose Route Frequency Provider Last Rate Last Dose  . albuterol (PROVENTIL HFA;VENTOLIN HFA) 108 (90 Base) MCG/ACT inhaler 2 puff  2 puff Inhalation Q4H PRN Byrnett, Forest Gleason, MD      . lactated ringers infusion   Intravenous Continuous Bary Castilla, Forest Gleason, MD      . ondansetron [facility]) 4 mg in sodium chloride 0.9 % 50 mL IVPB  4 mg Intravenous Q4H PRN Robert Bellow, MD      . traMADol Veatrice Bourbon) tablet 50 mg  50 mg Oral Q4H PRN Byrnett, Forest Gleason, MD        There are no Patient Instructions on file for this visit. No follow-ups on file.   Kris Hartmann, NP  This note was completed with Sales executive.  Any errors are purely unintentional.

## 2018-12-28 ENCOUNTER — Ambulatory Visit (INDEPENDENT_AMBULATORY_CARE_PROVIDER_SITE_OTHER): Payer: PPO | Admitting: Family Medicine

## 2018-12-28 ENCOUNTER — Encounter: Payer: Self-pay | Admitting: Family Medicine

## 2018-12-28 VITALS — BP 156/82 | HR 90 | Temp 99.8°F | Resp 20 | Ht 62.0 in | Wt 202.4 lb

## 2018-12-28 DIAGNOSIS — J209 Acute bronchitis, unspecified: Secondary | ICD-10-CM | POA: Diagnosis not present

## 2018-12-28 DIAGNOSIS — R0989 Other specified symptoms and signs involving the circulatory and respiratory systems: Secondary | ICD-10-CM

## 2018-12-28 DIAGNOSIS — J440 Chronic obstructive pulmonary disease with acute lower respiratory infection: Secondary | ICD-10-CM | POA: Diagnosis not present

## 2018-12-28 DIAGNOSIS — J111 Influenza due to unidentified influenza virus with other respiratory manifestations: Secondary | ICD-10-CM | POA: Diagnosis not present

## 2018-12-28 DIAGNOSIS — R059 Cough, unspecified: Secondary | ICD-10-CM

## 2018-12-28 DIAGNOSIS — R05 Cough: Secondary | ICD-10-CM | POA: Diagnosis not present

## 2018-12-28 LAB — POC INFLUENZA A&B (BINAX/QUICKVUE)
INFLUENZA A, POC: NEGATIVE
Influenza B, POC: NEGATIVE

## 2018-12-28 LAB — POCT RAPID STREP A (OFFICE): Rapid Strep A Screen: NEGATIVE

## 2018-12-28 MED ORDER — BENZONATATE 100 MG PO CAPS: ORAL_CAPSULE | ORAL | 1 refills | Status: DC

## 2018-12-28 MED ORDER — METHYLPREDNISOLONE ACETATE 80 MG/ML IJ SUSP
80.0000 mg | Freq: Once | INTRAMUSCULAR | Status: AC
  Administered 2018-12-28: 80 mg via INTRAMUSCULAR

## 2018-12-28 MED ORDER — DOXYCYCLINE HYCLATE 100 MG PO TABS: 100.0000 mg | ORAL_TABLET | Freq: Two times a day (BID) | ORAL | 0 refills | Status: DC

## 2018-12-28 MED ORDER — IPRATROPIUM BROMIDE 0.02 % IN SOLN
0.5000 mg | Freq: Once | RESPIRATORY_TRACT | Status: AC
  Administered 2018-12-28: 0.5 mg via RESPIRATORY_TRACT

## 2018-12-28 MED ORDER — ALBUTEROL SULFATE (2.5 MG/3ML) 0.083% IN NEBU: 2.5000 mg | INHALATION_SOLUTION | RESPIRATORY_TRACT | 1 refills | Status: DC | PRN

## 2018-12-28 MED ORDER — ALBUTEROL SULFATE (2.5 MG/3ML) 0.083% IN NEBU
2.5000 mg | INHALATION_SOLUTION | Freq: Once | RESPIRATORY_TRACT | Status: AC
  Administered 2018-12-28: 2.5 mg via RESPIRATORY_TRACT

## 2018-12-28 MED ORDER — OSELTAMIVIR PHOSPHATE 75 MG PO CAPS: 75.0000 mg | ORAL_CAPSULE | Freq: Two times a day (BID) | ORAL | 0 refills | Status: DC

## 2018-12-28 MED ORDER — ONDANSETRON 4 MG PO TBDP: 4.0000 mg | ORAL_TABLET | Freq: Three times a day (TID) | ORAL | 0 refills | Status: DC | PRN

## 2018-12-28 MED ORDER — LEVOFLOXACIN 500 MG PO TABS: 500.0000 mg | ORAL_TABLET | Freq: Every day | ORAL | 0 refills | Status: DC

## 2018-12-28 NOTE — Patient Instructions (Signed)
If your breathing worsens and is not helped with albuterol nebulizer, call the office or go to ER right away   Influenza, Adult Influenza is also called "the flu." It is an infection in the lungs, nose, and throat (respiratory tract). It is caused by a virus. The flu causes symptoms that are similar to symptoms of a cold. It also causes a high fever and body aches. The flu spreads easily from person to person (is contagious). Getting a flu shot (influenza vaccination) every year is the best way to prevent the flu. What are the causes? This condition is caused by the influenza virus. You can get the virus by:  Breathing in droplets that are in the air from the cough or sneeze of a person who has the virus.  Touching something that has the virus on it (is contaminated) and then touching your mouth, nose, or eyes. What increases the risk? Certain things may make you more likely to get the flu. These include:  Not washing your hands often.  Having close contact with many people during cold and flu season.  Touching your mouth, eyes, or nose without first washing your hands.  Not getting a flu shot every year. You may have a higher risk for the flu, along with serious problems such as a lung infection (pneumonia), if you:  Are older than 65.  Are pregnant.  Have a weakened disease-fighting system (immune system) because of a disease or taking certain medicines.  Have a long-term (chronic) illness, such as: ? Heart, kidney, or lung disease. ? Diabetes. ? Asthma.  Have a liver disorder.  Are very overweight (morbidly obese).  Have anemia. This is a condition that affects your red blood cells. What are the signs or symptoms? Symptoms usually begin suddenly and last 4-14 days. They may include:  Fever and chills.  Headaches, body aches, or muscle aches.  Sore throat.  Cough.  Runny or stuffy (congested) nose.  Chest discomfort.  Not wanting to eat as much as normal (poor  appetite).  Weakness or feeling tired (fatigue).  Dizziness.  Feeling sick to your stomach (nauseous) or throwing up (vomiting). How is this treated? If the flu is found early, you can be treated with medicine that can help reduce how bad the illness is and how long it lasts (antiviral medicine). This may be given by mouth (orally) or through an IV tube. Taking care of yourself at home can help your symptoms get better. Your doctor may suggest:  Taking over-the-counter medicines.  Drinking plenty of fluids. The flu often goes away on its own. If you have very bad symptoms or other problems, you may be treated in a hospital. Follow these instructions at home:     Activity  Rest as needed. Get plenty of sleep.  Stay home from work or school as told by your doctor. ? Do not leave home until you do not have a fever for 24 hours without taking medicine. ? Leave home only to visit your doctor. Eating and drinking  Take an ORS (oral rehydration solution). This is a drink that is sold at pharmacies and stores.  Drink enough fluid to keep your pee (urine) pale yellow.  Drink clear fluids in small amounts as you are able. Clear fluids include: ? Water. ? Ice chips. ? Fruit juice that has water added (diluted fruit juice). ? Low-calorie sports drinks.  Eat bland, easy-to-digest foods in small amounts as you are able. These foods include: ? Bananas. ?  Applesauce. ? Rice. ? Lean meats. ? Toast. ? Crackers.  Do not eat or drink: ? Fluids that have a lot of sugar or caffeine. ? Alcohol. ? Spicy or fatty foods. General instructions  Take over-the-counter and prescription medicines only as told by your doctor.  Use a cool mist humidifier to add moisture to the air in your home. This can make it easier for you to breathe.  Cover your mouth and nose when you cough or sneeze.  Wash your hands with soap and water often, especially after you cough or sneeze. If you cannot use soap  and water, use alcohol-based hand sanitizer.  Keep all follow-up visits as told by your doctor. This is important. How is this prevented?   Get a flu shot every year. You may get the flu shot in late summer, fall, or winter. Ask your doctor when you should get your flu shot.  Avoid contact with people who are sick during fall and winter (cold and flu season). Contact a doctor if:  You get new symptoms.  You have: ? Chest pain. ? Watery poop (diarrhea). ? A fever.  Your cough gets worse.  You start to have more mucus.  You feel sick to your stomach.  You throw up. Get help right away if you:  Have shortness of breath.  Have trouble breathing.  Have skin or nails that turn a bluish color.  Have very bad pain or stiffness in your neck.  Get a sudden headache.  Get sudden pain in your face or ear.  Cannot eat or drink without throwing up. Summary  Influenza ("the flu") is an infection in the lungs, nose, and throat. It is caused by a virus.  Take over-the-counter and prescription medicines only as told by your doctor.  Getting a flu shot every year is the best way to avoid getting the flu. This information is not intended to replace advice given to you by your health care provider. Make sure you discuss any questions you have with your health care provider. Document Released: 08/10/2008 Document Revised: 04/19/2018 Document Reviewed: 04/19/2018 Elsevier Interactive Patient Education  2019 [employer].

## 2018-12-28 NOTE — Progress Notes (Signed)
Subjective:    Patient ID: Andrea Stewart, female    DOB: [birth year], 73 y.o.   MRN: [id number]  HPI   Patient presents to clinic complaining of cough, chest congestion, feeling achy all over.  States her young granddaughter was diagnosed with flu at the beginning of this week so she does have a positive flu contact.  Patient is using some Tessalon Perles to help reduce cough with okay affect.  States sometimes she will cough up thick green phlegm.  She feels very rundown and is having alternating hot and cold chills.  Denies nausea/vomiting or diarrhea.  Patient Active Problem List   Diagnosis Date Noted  . PAD (peripheral artery disease) ([place]) 05/30/2018  . Family history of colon cancer requiring screening colonoscopy 04/06/2018  . Incarcerated ventral hernia 09/07/2017  . Ventral hernia 09/07/2017  . Atherosclerosis of native arteries of extremity with intermittent claudication ([place]) 06/21/2017  . Morbid obesity ([place]) 10/21/2016  . Acute bronchitis with COPD ([place]) 09/16/2016  . Edema 06/19/2016  . Tobacco abuse counseling 12/17/2015  . Tobacco use disorder 12/11/2015  . CKD (chronic kidney disease), stage III ([place]) 05/13/2015  . COPD (chronic obstructive pulmonary disease) with chronic bronchitis ([place]) 01/21/2015  . Hx of left heart catheterization by cutdown   . Single kidney 09/30/2014  . Irritable bowel syndrome 09/09/2012  . Coronary atherosclerosis 07/26/2009  . Diabetes mellitus type 2 with complications, uncontrolled ([place]) 03/09/2007  . DEPRESSION 03/09/2007  . PERIPHERAL NEUROPATHY 03/09/2007  . Essential hypertension 03/09/2007  . DEGENERATIVE DISC DISEASE 03/09/2007  . BACK PAIN, CHRONIC 03/09/2007  . FIBROMYALGIA 03/09/2007   Social History   Tobacco Use  . Smoking status: Current Every Day Smoker    Packs/day: 0.25    Years: 30.00    Pack years: 7.50    Types: Cigarettes  . Smokeless tobacco: Never Used  . Tobacco comment: currently smoking .25 ppd,  trying to quit 02/13/16  Substance Use Topics  . Alcohol use: No    Alcohol/week: 0.0 standard drinks   Review of Systems  Constitutional: +chills, fatigue and fever.  HENT: +congestion, ear pain, sinus pain. No sore throat.   Eyes: Negative.   Respiratory: +cough, chest congestion, shortness of breath and wheezing.   Cardiovascular: Negative for chest pain, palpitations and leg swelling.  Gastrointestinal: Negative for abdominal pain, diarrhea, nausea and vomiting.  Genitourinary: Negative for dysuria, frequency and urgency.  Musculoskeletal: +body aches  Skin: Negative for color change, pallor and rash.  Neurological: Negative for syncope, light-headedness and headaches.  Psychiatric/Behavioral: The patient is not nervous/anxious.       Objective:   Physical Exam Vitals signs and nursing note reviewed.  Constitutional:      General: She is not in acute distress.    Appearance: She is ill-appearing (appears tired). She is not toxic-appearing or diaphoretic.  HENT:     Head: Normocephalic and atraumatic.     Nose: Congestion and rhinorrhea present.     Mouth/Throat:     Mouth: Mucous membranes are moist.     Pharynx: Posterior oropharyngeal erythema present. No oropharyngeal exudate.  Eyes:     General: No scleral icterus.       Right eye: No discharge.        Left eye: No discharge.     Extraocular Movements: Extraocular movements intact.     Conjunctiva/sclera: Conjunctivae normal.  Neck:     Musculoskeletal: Neck supple. No neck rigidity.  Cardiovascular:     Rate and Rhythm:  Normal rate and regular rhythm.  Pulmonary:     Effort: Pulmonary effort is normal. No respiratory distress.     Breath sounds: Wheezing and rhonchi present. No rales.  Abdominal:     General: Bowel sounds are normal.     Palpations: Abdomen is soft.     Tenderness: There is no abdominal tenderness.  Musculoskeletal:     Right lower leg: No edema.     Left lower leg: No edema.    Lymphadenopathy:     Cervical: No cervical adenopathy.  Skin:    General: Skin is warm and dry.  Neurological:     Mental Status: She is alert and oriented to person, place, and time.  Psychiatric:        Mood and Affect: Mood normal.        Behavior: Behavior normal.    Today's Vitals   12/28/18 0833 12/28/18 0848  BP: (!) 178/80 (!) 156/82  Pulse: 90   Resp: 20   Temp: 99.8 F (37.7 C)   TempSrc: Oral   SpO2: 90%   Weight: 202 lb 6.4 oz (91.8 kg)   Height: 5\' 2"  (1.575 m)    Body mass index is 37.02 kg/m.     Assessment & Plan:   Influenza-patient does have a positive flu contact and combination with her symptoms and elevated temperature we will treat her for flu with Tamiflu. She rest, increase fluids and do good handwashing. Zofran sent in as precaution in case she develops nausea.   She just congestion, acute bronchitis COPD, cough- due to patient's productive cough, lung sounds and COPD history she was given nebulizer treatment in clinic and also IM steroid x1.  She will take course of Levaquin to cover respiratory infection.  She use Tessalon Perles as needed to help reduce cough symptoms.  Patient was also sent home with a nebulizer machine and nebulizer solution so she can use at home.  Administrations This Visit    albuterol (PROVENTIL) (2.5 MG/3ML) 0.083% nebulizer solution 2.5 mg    Admin Date 12/28/2018 Action Given Dose 2.5 mg Route Nebulization Administered By Neta Ehlers, RMA       ipratropium (ATROVENT) nebulizer solution 0.5 mg    Admin Date 12/28/2018 Action Given Dose 0.5 mg Route Nebulization Administered By Neta Ehlers, RMA       methylPREDNISolone acetate (DEPO-MEDROL) injection 80 mg    Admin Date 12/28/2018 Action Given Dose 80 mg Route Intramuscular Administered By Neta Ehlers, RMA          She will follow-up here in 1 week to recheck symptoms to be sure she is improving.  Advised that if her shortness of breath  is not improved by use of nebulizer or albuterol inhaler; or her symptoms worsen she is to call office and or go to ER right away for evaluation

## 2019-01-04 DIAGNOSIS — I129 Hypertensive chronic kidney disease with stage 1 through stage 4 chronic kidney disease, or unspecified chronic kidney disease: Secondary | ICD-10-CM | POA: Diagnosis not present

## 2019-01-04 DIAGNOSIS — R319 Hematuria, unspecified: Secondary | ICD-10-CM | POA: Diagnosis not present

## 2019-01-04 DIAGNOSIS — N184 Chronic kidney disease, stage 4 (severe): Secondary | ICD-10-CM | POA: Diagnosis not present

## 2019-01-04 DIAGNOSIS — R809 Proteinuria, unspecified: Secondary | ICD-10-CM | POA: Diagnosis not present

## 2019-01-04 DIAGNOSIS — E1122 Type 2 diabetes mellitus with diabetic chronic kidney disease: Secondary | ICD-10-CM | POA: Diagnosis not present

## 2019-01-05 ENCOUNTER — Ambulatory Visit: Payer: Self-pay

## 2019-01-05 NOTE — Telephone Encounter (Signed)
Pt. Calling to reports she is still having "some shortness of breath - it is a little better with the nebulizer." States she saw her kidney doctor yesterday, Dr. Juleen China.States her kidney "function is down to 18%." Pt. Wants to come in as soon as possible to review this with Dr. Derrel Nip. No availability the first of next week. Would like to be worked into her schedule if possible.Please advise pt. Instructed if breathing changed or worsened to go to ED.  Answer Assessment - Initial Assessment Questions 1. ONSET: "When did the cough begin?"      Started last Tuesday 2. SEVERITY: "How bad is the cough today?"      Severe 3. RESPIRATORY DISTRESS: "Describe your breathing."      Shortness of breath 4. FEVER: "Do you have a fever?" If so, ask: "What is your temperature, how was it measured, and when did it start?"     No 5. HEMOPTYSIS: "Are you coughing up any blood?" If so ask: "How much?" (flecks, streaks, tablespoons, etc.)     No 6. TREATMENT: "What have you done so far to treat the cough?" (e.g., meds, fluids, humidifier)     Nebulizer  7. CARDIAC HISTORY: "Do you have any history of heart disease?" (e.g., heart attack, congestive heart failure)      Yes 8. LUNG HISTORY: "Do you have any history of lung disease?"  (e.g., pulmonary embolus, asthma, emphysema)     Yes 9. PE RISK FACTORS: "Do you have a history of blood clots?" (or: recent major surgery, recent prolonged travel, bedridden)     No 10. OTHER SYMPTOMS: "Do you have any other symptoms? (e.g., runny nose, wheezing, chest pain)       A little wheezing 11. PREGNANCY: "Is there any chance you are pregnant?" "When was your last menstrual period?"       No 12. TRAVEL: "Have you traveled out of the country in the last month?" (e.g., travel history, exposures)       No  Protocols used: COUGH - ACUTE NON-PRODUCTIVE-A-AH

## 2019-01-05 NOTE — Telephone Encounter (Signed)
Pt scheduled for 2/24 at 10:30 with Dr. Derrel Nip

## 2019-01-05 NOTE — Telephone Encounter (Signed)
I spoke with patient & offered appointment today woth Mable Paris, FNP. She said that when she saw Dr. Juleen China yesterday that he advise that she stay hydrated most importantly, continue taking tylenol & muccinex. I advised that I would try to get patient in to see Dr. Derrel Nip ASAP if we could find an opening & I would call her back.

## 2019-01-07 ENCOUNTER — Encounter (INDEPENDENT_AMBULATORY_CARE_PROVIDER_SITE_OTHER): Payer: Self-pay | Admitting: Nurse Practitioner

## 2019-01-08 ENCOUNTER — Ambulatory Visit (INDEPENDENT_AMBULATORY_CARE_PROVIDER_SITE_OTHER): Payer: PPO | Admitting: Internal Medicine

## 2019-01-08 ENCOUNTER — Ambulatory Visit (INDEPENDENT_AMBULATORY_CARE_PROVIDER_SITE_OTHER): Payer: PPO

## 2019-01-08 ENCOUNTER — Encounter: Payer: Self-pay | Admitting: Internal Medicine

## 2019-01-08 VITALS — BP 148/74 | HR 84 | Temp 98.1°F | Resp 17 | Ht 62.0 in | Wt 198.4 lb

## 2019-01-08 DIAGNOSIS — J440 Chronic obstructive pulmonary disease with acute lower respiratory infection: Secondary | ICD-10-CM | POA: Diagnosis not present

## 2019-01-08 DIAGNOSIS — J209 Acute bronchitis, unspecified: Secondary | ICD-10-CM | POA: Diagnosis not present

## 2019-01-08 DIAGNOSIS — F17200 Nicotine dependence, unspecified, uncomplicated: Secondary | ICD-10-CM

## 2019-01-08 DIAGNOSIS — R05 Cough: Secondary | ICD-10-CM | POA: Diagnosis not present

## 2019-01-08 MED ORDER — METHYLPREDNISOLONE ACETATE 40 MG/ML IJ SUSP
40.0000 mg | Freq: Once | INTRAMUSCULAR | Status: AC
  Administered 2019-01-08: 40 mg via INTRAMUSCULAR

## 2019-01-08 MED ORDER — BUDESONIDE 0.25 MG/2ML IN SUSP: 0.2500 mg | Freq: Two times a day (BID) | RESPIRATORY_TRACT | 12 refills | Status: DC

## 2019-01-08 MED ORDER — DOXYCYCLINE HYCLATE 100 MG PO TABS: 100.0000 mg | ORAL_TABLET | Freq: Two times a day (BID) | ORAL | 0 refills | Status: DC

## 2019-01-08 NOTE — Patient Instructions (Addendum)
Despite your symptoms,  Your lung exam today is better and your oxygen levels is improving   Your symptoms appear to be from COPD.   I am prescribing doxycycline as your antibiotic.  Take it every 12 hours with food to prevent nausea. Use your zofran every 6 to 8 hours if nenended   Please continue using the albuterol nebulized every 6 hours if needed for wheezing.  I have ordered Pulmicort in a nebulized liquid to use two times daily   You can return for a steroid shot on Thursday if no better.

## 2019-01-08 NOTE — Progress Notes (Signed)
Subjective:  Patient ID: Andrea Stewart, female    DOB: 05/16/1946  Age: 73 y.o. MRN: [id number]  CC: The primary encounter diagnosis was Acute bronchitis with COPD ([place]). A diagnosis of Tobacco use disorder was also pertinent to this visit.  HPI Andrea Stewart presents for follow up on persistent flu  Like symptoms .  Treated empirically on Feb 13  For   Influenza and COPD exacerbation given symptoms that started < 24 hours prior to presentation and included  headache , nausea,  And productive cough in the setting of recent influenza exposure.  She had a negative POCT test.  She stopped the tamiflu  after 2 doses due to worsening of nausea  Because she was worried about its effect on kidney function. (dose had not been  reduced to 30 mg bid)   Saw [facility] kidney function was rechecked last Thursday.  Currently endorsing  persistent Headache,  Nausea,  Dyspnea.  Nausea is aggravated by eating .  Developed a metallic taste in mouth after starting Tamiflu which as resolved .  Stopped smoking on feb 12.  No fevers,  Cough is nonproductive .  Using albuterol nebulizer once daily     Outpatient Medications Prior to Visit  Medication Sig Dispense Refill  . albuterol (PROVENTIL HFA) 108 (90 Base) MCG/ACT inhaler Inhale 2 puffs every 4 (four) hours as needed into the lungs for wheezing or shortness of breath. 1 Inhaler 2  . albuterol (PROVENTIL) (2.5 MG/3ML) 0.083% nebulizer solution Take 3 mLs (2.5 mg total) by nebulization every 4 (four) hours as needed for wheezing or shortness of breath. 150 mL 1  . amLODipine (NORVASC) 10 MG tablet TAKE 1 TABLET BY MOUTH DAILY 90 tablet 1  . aspirin 81 MG tablet Take 1 tablet (81 mg total) by mouth daily.    Marland Kitchen atorvastatin (LIPITOR) 20 MG tablet TAKE 1 TABLET BY MOUTH EVERY DAY 90 tablet 1  . benzonatate (TESSALON) 100 MG capsule Take 1 or 2 capsules threes times per day as needed for cough 30 capsule 1  . budesonide-formoterol (SYMBICORT) 160-4.5 MCG/ACT  inhaler Inhale 2 puffs into the lungs 2 (two) times daily. 1 Inhaler 5  . calcium carbonate (OS-CAL) 600 MG TABS Take 600 mg by mouth daily.      . carvedilol (COREG) 6.25 MG tablet Take 1 tablet (6.25 mg total) by mouth daily. 90 tablet 1  . co-enzyme Q-10 30 MG capsule Take 30 mg by mouth daily.    Marland Kitchen DAILY MULTIPLE VITAMINS PO Take 1 tablet by mouth daily.      . fluticasone (VERAMYST) 27.5 MCG/SPRAY nasal spray Place 2 sprays into the nose as needed for rhinitis or allergies.     Marland Kitchen insulin lispro (HUMALOG) 100 UNIT/ML injection Inject into the skin as directed. Sliding scale 100-150 = 2-2.5 units 150-200 = 3-3.5 units 200-250 = 4-4.5 units 251-300 = 5-5.5 units 301-350 = 6-6.5 units 350-400 = 7-7.5 units    . insulin NPH (HUMULIN N,NOVOLIN N) 100 UNIT/ML injection Inject 22 Units into the skin daily before breakfast. 24 units at bedtime    . irbesartan (AVAPRO) 150 MG tablet Take 150 mg by mouth 2 (two) times daily.     . metolazone (ZAROXOLYN) 2.5 MG tablet TAKE 1 TABLET BY MOUTH DAILY 30 MINUTES PRIOR TO TORSEMIDE 90 tablet 1  . mometasone (NASONEX) 50 MCG/ACT nasal spray Place 2 sprays into the nose daily as needed.     . nitroGLYCERIN (NITROSTAT) 0.4 MG SL  tablet Place 1 tablet (0.4 mg total) under the tongue every 5 (five) minutes as needed. Maximum 3 tablets (Patient taking differently: Place 0.4 mg under the tongue every 5 (five) minutes as needed for chest pain. Maximum 3 tablets) 25 tablet 6  . ondansetron (ZOFRAN ODT) 4 MG disintegrating tablet Take 1 tablet (4 mg total) by mouth every 8 (eight) hours as needed for nausea or vomiting. 20 tablet 0  . oseltamivir (TAMIFLU) 75 MG capsule Take 1 capsule (75 mg total) by mouth 2 (two) times daily. 10 capsule 0  . sodium chloride (OCEAN) 0.65 % SOLN nasal spray Place 2 sprays into both nostrils as needed for congestion.    Marland Kitchen Spacer/Aero-Holding Chambers (AEROCHAMBER MV) inhaler Use as instructed 1 each 0  . torsemide (DEMADEX) 5 MG  tablet TAKE 1 TABLET BY MOUTH DAILY 90 tablet 1  . doxycycline (VIBRA-TABS) 100 MG tablet Take 1 tablet (100 mg total) by mouth 2 (two) times daily. (Patient not taking: Reported on 01/08/2019) 20 tablet 0  . levofloxacin (LEVAQUIN) 500 MG tablet Take 1 tablet (500 mg total) by mouth daily. (Patient not taking: Reported on 01/08/2019) 7 tablet 0   Facility-Administered Medications Prior to Visit  Medication Dose Route Frequency Provider Last Rate Last Dose  . albuterol (PROVENTIL HFA;VENTOLIN HFA) 108 (90 Base) MCG/ACT inhaler 2 puff  2 puff Inhalation Q4H PRN Byrnett, Forest Gleason, MD      . lactated ringers infusion   Intravenous Continuous Bary Castilla, Forest Gleason, MD      . ondansetron [facility]) 4 mg in sodium chloride 0.9 % 50 mL IVPB  4 mg Intravenous Q4H PRN Robert Bellow, MD      . traMADol Veatrice Bourbon) tablet 50 mg  50 mg Oral Q4H PRN Robert Bellow, MD        Review of Systems;  Patient denies headache, fevers, malaise, unintentional weight loss, skin rash, eye pain, sinus congestion and sinus pain, sore throat, dysphagia,  hemoptysis , chest pain, palpitations, orthopnea, edema, abdominal pain, , melena, diarrhea, constipation, flank pain, dysuria, hematuria, urinary  Frequency, nocturia, numbness, tingling, seizures,  Focal weakness, Loss of consciousness,  Tremor, insomnia, depression, anxiety, and suicidal ideation.      Objective:  BP (!) 148/74 (BP Location: Left Arm, Patient Position: Sitting, Cuff Size: Large)   Pulse 84   Temp 98.1 F (36.7 C) (Oral)   Resp 17   Ht 5\' 2"  (1.575 m)   Wt 198 lb 6.4 oz (90 kg)   SpO2 94%   BMI 36.29 kg/m   BP Readings from Last 3 Encounters:  01/08/19 (!) 148/74  12/28/18 (!) 156/82  12/25/18 (!) 170/82    Wt Readings from Last 3 Encounters:  01/08/19 198 lb 6.4 oz (90 kg)  12/28/18 202 lb 6.4 oz (91.8 kg)  12/25/18 200 lb (90.7 kg)    General appearance: tired appearing but alert, cooperative and appears  Younger than stated  age Ears: normal TM's and external ear canals both ears Throat: lips, mucosa, and tongue normal; teeth and gums normal Neck: no adenopathy, no carotid bruit, supple, symmetrical, trachea midline and thyroid not enlarged, symmetric, no tenderness/mass/nodules Back: symmetric, no curvature. ROM normal. No CVA tenderness. Lungs: clear to auscultation bilaterally. NO WHEEZING OR EGOPHONY Heart: regular rate and rhythm, S1, S2 normal, no murmur, click, rub or gallop Abdomen: soft, non-tender; bowel sounds normal; no masses,  no organomegaly Pulses: 2+ and symmetric Skin: Skin color, texture, turgor normal. No rashes or lesions Lymph  nodes: Cervical, supraclavicular, and axillary nodes normal. Neuro:  awake and interactive with normal mood and affect. Higher cortical functions are normal. Speech is clear without word-finding difficulty or dysarthria. Extraocular movements are intact. Visual fields of both eyes are grossly intact. Sensation to light touch is grossly intact bilaterally of upper and lower extremities. Motor examination shows 4+/5 symmetric hand grip and upper extremity and 5/5 lower extremity strength. There is no pronation or drift. Gait is non-ataxic    Lab Results  Component Value Date   HGBA1C 9.4 (H) 09/07/2017   HGBA1C 9.7 07/25/2017   HGBA1C 8.9 (A) 01/01/2014    Lab Results  Component Value Date   CREATININE 2.1 (A) 07/31/2018   CREATININE 2.1 (A) 04/04/2018   CREATININE 2.0 (A) 09/21/2017    Lab Results  Component Value Date   WBC 6.8 04/04/2018   HGB 12.1 07/31/2018   HCT 36 07/31/2018   PLT 209 07/31/2018   GLUCOSE 141 (H) 09/08/2017   CHOL 136 08/25/2017   TRIG 111.0 08/25/2017   HDL 60.30 08/25/2017   LDLDIRECT 51.9 09/07/2012   LDLCALC 53 08/25/2017   ALT 16 07/22/2017   AST 18 07/22/2017   NA 139 07/31/2018   K 4.3 07/31/2018   CL 105 09/08/2017   CREATININE 2.1 (A) 07/31/2018   BUN 37 (A) 07/31/2018   CO2 28 09/08/2017   TSH 3.04 08/25/2017    INR 1.01 03/27/2015   HGBA1C 9.4 (H) 09/07/2017   MICROALBUR protein/creatinine [id number] 04/04/2018    Ct Chest Lung Cancer Screening Low Dose Wo Contrast  Result Date: 04/25/2018 CLINICAL DATA:  73 year old female former smoker (quit 2 days ago) with 32 pack-year history of smoking. Lung cancer screening examination. EXAM: CT CHEST WITHOUT CONTRAST LOW-DOSE FOR LUNG CANCER SCREENING TECHNIQUE: Multidetector CT imaging of the chest was performed following the standard protocol without IV contrast. COMPARISON:  Low-dose lung cancer screening chest CT 03/29/2017. FINDINGS: Cardiovascular: Heart size is normal. There is no significant pericardial fluid, thickening or pericardial calcification. There is aortic atherosclerosis, as well as atherosclerosis of the great vessels of the mediastinum and the coronary arteries, including calcified atherosclerotic plaque in the left anterior descending and left circumflex coronary arteries. Mediastinum/Nodes: Again noted in the posterior aspect of the superior mediastinum is a 2.8 x 2.1 cm soft tissue attenuation lesion with partial calcifications, similar to prior studies dating back to 2016, likely an exophytic thyroid nodule. No pathologically enlarged mediastinal or hilar lymph nodes. Please note that accurate exclusion of hilar adenopathy is limited on noncontrast CT scans. Esophagus is unremarkable in appearance. No axillary lymphadenopathy. Lungs/Pleura: Small pulmonary nodules are noted in lungs bilaterally, the largest of which is in the inferior segment of the lingula (axial image 183 of series 3), with a volume derived mean diameter of 4.5 mm. No larger more suspicious appearing pulmonary nodules or masses are otherwise noted. No acute consolidative airspace disease. No pleural effusions. Linear scarring throughout the lung bases bilaterally, most evident in the left lower lobe. Mild diffuse bronchial wall thickening with very mild centrilobular and paraseptal  emphysema. Upper Abdomen: Aortic atherosclerosis. Surgical clips in the upper left retroperitoneum, potentially from prior nephrectomy. Musculoskeletal: There are no aggressive appearing lytic or blastic lesions noted in the visualized portions of the skeleton. IMPRESSION: 1. Lung-RADS 2S, benign appearance or behavior. Continue annual screening with low-dose chest CT without contrast in 12 months. 2. The "S" modifier above refers to potentially clinically significant non lung cancer related findings. Specifically, there is aortic  atherosclerosis, in addition to 2 vessel coronary artery disease. Please note that although the presence of coronary artery calcium documents the presence of coronary artery disease, the severity of this disease and any potential stenosis cannot be assessed on this non-gated CT examination. Assessment for potential risk factor modification, dietary therapy or pharmacologic therapy may be warranted, if clinically indicated. 3. Mild diffuse bronchial wall thickening with very mild centrilobular and paraseptal emphysema; imaging findings suggestive of underlying COPD. 4. Additional incidental findings, as above. Aortic Atherosclerosis (ICD10-I70.0) and Emphysema (ICD10-J43.9). Electronically Signed   By: Vinnie Langton M.D.   On: 04/25/2018 12:08    Assessment & Plan:   Problem List Items Addressed This Visit    Tobacco use disorder    She continues to smoke despite having COPD,   Type 2 DM and CAD. She has not smoked since Feb 13th.  encouraged strongly not to resume once she is feeling better.       Acute bronchitis with COPD ([place]) - Primary    She is intolerant of oral steroid tapers but can tolerate IM Depo Medrol, which was repeated today.  She has completed Levaquin,  And chest x ray today is normal.  She is intolerant of doxycycline due to nausea and I do not want to aggravate her current nausea with repeat round of antibiotics when she has a nonproductive cough and a  clear chest x ray.  Continue to push fluids,  Use zofran prn,  And adding budesonide nebulized med .       Relevant Medications   budesonide (PULMICORT) 0.25 MG/2ML nebulizer solution   methylPREDNISolone acetate (DEPO-MEDROL) injection 40 mg (Completed)   Other Relevant Orders   DG Chest 2 View (Completed)      A total of 25 minutes of face to face time was spent with patient more than half of which was spent in counselling about the above mentioned conditions  and coordination of care  I have discontinued Elliannah R. Sedlack's doxycycline, levofloxacin, and doxycycline. I am also having her start on budesonide. Additionally, I am having her maintain her calcium carbonate, DAILY MULTIPLE VITAMINS PO, insulin lispro, fluticasone, mometasone, insulin NPH Human, aspirin, nitroGLYCERIN, sodium chloride, AEROCHAMBER MV, budesonide-formoterol, irbesartan, co-enzyme Q-10, albuterol, carvedilol, atorvastatin, amLODipine, torsemide, metolazone, albuterol, ondansetron, oseltamivir, and benzonatate. We administered methylPREDNISolone acetate. We will continue to administer lactated ringers, ondansetron (ZOFRAN) 4 mg in sodium chloride 0.9 % 50 mL IVPB, albuterol, and traMADol.  Meds ordered this encounter  Medications  . budesonide (PULMICORT) 0.25 MG/2ML nebulizer solution    Sig: Take 2 mLs (0.25 mg total) by nebulization 2 (two) times daily.    Dispense:  60 mL    Refill:  12  . DISCONTD: doxycycline (VIBRA-TABS) 100 MG tablet    Sig: Take 1 tablet (100 mg total) by mouth 2 (two) times daily.    Dispense:  20 tablet    Refill:  0  . methylPREDNISolone acetate (DEPO-MEDROL) injection 40 mg    Medications Discontinued During This Encounter  Medication Reason  . doxycycline (VIBRA-TABS) 100 MG tablet Completed Course  . levofloxacin (LEVAQUIN) 500 MG tablet Completed Course  . doxycycline (VIBRA-TABS) 100 MG tablet     Follow-up: Return in about 3 days (around 01/11/2019).   Crecencio Mc,  MD

## 2019-01-09 NOTE — Assessment & Plan Note (Signed)
She continues to smoke despite having COPD,   Type 2 DM and CAD. She has not smoked since Feb 13th.  encouraged strongly not to resume once she is feeling better.

## 2019-01-09 NOTE — Assessment & Plan Note (Signed)
She is intolerant of oral steroid tapers but can tolerate IM Depo Medrol, which was repeated today.  She has completed Levaquin,  And chest x ray today is normal.  She is intolerant of doxycycline due to nausea and I do not want to aggravate her current nausea with repeat round of antibiotics when she has a nonproductive cough and a clear chest x ray.  Continue to push fluids,  Use zofran prn,  And adding budesonide nebulized med .

## 2019-01-29 ENCOUNTER — Other Ambulatory Visit: Payer: Self-pay | Admitting: Internal Medicine

## 2019-01-29 DIAGNOSIS — Z1231 Encounter for screening mammogram for malignant neoplasm of breast: Secondary | ICD-10-CM

## 2019-01-30 DIAGNOSIS — E1165 Type 2 diabetes mellitus with hyperglycemia: Secondary | ICD-10-CM | POA: Diagnosis not present

## 2019-02-13 DIAGNOSIS — R319 Hematuria, unspecified: Secondary | ICD-10-CM | POA: Diagnosis not present

## 2019-02-13 DIAGNOSIS — N184 Chronic kidney disease, stage 4 (severe): Secondary | ICD-10-CM | POA: Diagnosis not present

## 2019-02-13 DIAGNOSIS — R809 Proteinuria, unspecified: Secondary | ICD-10-CM | POA: Diagnosis not present

## 2019-02-13 DIAGNOSIS — E1122 Type 2 diabetes mellitus with diabetic chronic kidney disease: Secondary | ICD-10-CM | POA: Diagnosis not present

## 2019-02-23 ENCOUNTER — Other Ambulatory Visit: Payer: Self-pay | Admitting: Internal Medicine

## 2019-02-26 ENCOUNTER — Telehealth (INDEPENDENT_AMBULATORY_CARE_PROVIDER_SITE_OTHER): Payer: Self-pay | Admitting: Vascular Surgery

## 2019-02-26 NOTE — Telephone Encounter (Signed)
I spoke with Schnier and he advise for the patient to be schedule for tomorrow with abi and see Dew

## 2019-02-27 ENCOUNTER — Encounter (INDEPENDENT_AMBULATORY_CARE_PROVIDER_SITE_OTHER): Payer: Self-pay | Admitting: Vascular Surgery

## 2019-02-27 ENCOUNTER — Other Ambulatory Visit (INDEPENDENT_AMBULATORY_CARE_PROVIDER_SITE_OTHER): Payer: Self-pay | Admitting: Vascular Surgery

## 2019-02-27 ENCOUNTER — Ambulatory Visit (INDEPENDENT_AMBULATORY_CARE_PROVIDER_SITE_OTHER): Payer: PPO

## 2019-02-27 ENCOUNTER — Ambulatory Visit (INDEPENDENT_AMBULATORY_CARE_PROVIDER_SITE_OTHER): Payer: PPO | Admitting: Vascular Surgery

## 2019-02-27 ENCOUNTER — Other Ambulatory Visit: Payer: Self-pay

## 2019-02-27 VITALS — BP 165/76 | HR 73 | Resp 16 | Ht 62.0 in | Wt 200.0 lb

## 2019-02-27 DIAGNOSIS — N183 Chronic kidney disease, stage 3 unspecified: Secondary | ICD-10-CM

## 2019-02-27 DIAGNOSIS — I739 Peripheral vascular disease, unspecified: Secondary | ICD-10-CM | POA: Diagnosis not present

## 2019-02-27 DIAGNOSIS — M79605 Pain in left leg: Secondary | ICD-10-CM

## 2019-02-27 DIAGNOSIS — IMO0002 Reserved for concepts with insufficient information to code with codable children: Secondary | ICD-10-CM

## 2019-02-27 DIAGNOSIS — M79604 Pain in right leg: Secondary | ICD-10-CM

## 2019-02-27 DIAGNOSIS — I129 Hypertensive chronic kidney disease with stage 1 through stage 4 chronic kidney disease, or unspecified chronic kidney disease: Secondary | ICD-10-CM

## 2019-02-27 DIAGNOSIS — E1165 Type 2 diabetes mellitus with hyperglycemia: Secondary | ICD-10-CM

## 2019-02-27 DIAGNOSIS — E1151 Type 2 diabetes mellitus with diabetic peripheral angiopathy without gangrene: Secondary | ICD-10-CM

## 2019-02-27 DIAGNOSIS — Z79899 Other long term (current) drug therapy: Secondary | ICD-10-CM

## 2019-02-27 DIAGNOSIS — E1122 Type 2 diabetes mellitus with diabetic chronic kidney disease: Secondary | ICD-10-CM

## 2019-02-27 DIAGNOSIS — E118 Type 2 diabetes mellitus with unspecified complications: Secondary | ICD-10-CM

## 2019-02-27 DIAGNOSIS — I10 Essential (primary) hypertension: Secondary | ICD-10-CM

## 2019-02-27 NOTE — Assessment & Plan Note (Signed)
ABIs were done today. These demonstrate reasonably stable perfusion at 0.87 and 0.88 on the right and left respectively.  The waveforms are biphasic distally bilaterally.  Duplex does not show a focal stenosis in the right lower extremity.  In the mid left SFA there is a 75 to 99% stenosis. At this point, I do not think her worsening pain is due to worsening blood flow.  Her blood flow is stable from her previous studies and there is not been a major drop.  Her right leg is the more severely affected of the 2 legs and no obvious focal stenosis is seen.  At this point, I would continue her current medical regimen and do activity as tolerated.  Her pain may be arthritic from her joints or neuropathic from her back.  Either way, reperfusion is unlikely to be of great benefit at this time particularly given her renal insufficiency and the possibility of nephrotoxicity with angiography.  She will return in 6 months with noninvasive studies or sooner if problems develop in the interim.

## 2019-02-27 NOTE — Progress Notes (Signed)
MRN : [id number]  Andrea Stewart is a 73 y.o. (02-03-1946) female who presents with chief complaint of  Chief Complaint  Patient presents with  . Follow-up    abi u/s and leg pain  .  History of Present Illness: Patient returns today in follow up of her PAD and worsening right leg pain.  She returns prior to her scheduled follow-up visit due to worsening pain.  She called the on-call physician over the weekend and was arranged to come in today to have her blood flow checked.  No new ulcers.  The pain is really there all the time and is worse with activity.  She has a history of back problems as well as arthritis.  Particularly given her significant chronic kidney disease with a creatinine of 2.1 and a solitary kidney, noninvasive studies were performed prior to considering angiography.  These demonstrate reasonably stable perfusion at 0.87 and 0.88 on the right and left respectively.  The waveforms are biphasic distally bilaterally.  Duplex does not show a focal stenosis in the right lower extremity.  In the mid left SFA there is a 75 to 99% stenosis.  Current Outpatient Medications  Medication Sig Dispense Refill  . albuterol (PROVENTIL HFA) 108 (90 Base) MCG/ACT inhaler Inhale 2 puffs every 4 (four) hours as needed into the lungs for wheezing or shortness of breath. 1 Inhaler 2  . albuterol (PROVENTIL) (2.5 MG/3ML) 0.083% nebulizer solution Take 3 mLs (2.5 mg total) by nebulization every 4 (four) hours as needed for wheezing or shortness of breath. 150 mL 1  . amLODipine (NORVASC) 10 MG tablet TAKE 1 TABLET BY MOUTH DAILY 90 tablet 1  . aspirin 81 MG tablet Take 1 tablet (81 mg total) by mouth daily.    Marland Kitchen atorvastatin (LIPITOR) 20 MG tablet TAKE 1 TABLET BY MOUTH DAILY 90 tablet 1  . benzonatate (TESSALON) 100 MG capsule Take 1 or 2 capsules threes times per day as needed for cough 30 capsule 1  . budesonide (PULMICORT) 0.25 MG/2ML nebulizer solution Take 2 mLs (0.25 mg total) by  nebulization 2 (two) times daily. 60 mL 12  . budesonide-formoterol (SYMBICORT) 160-4.5 MCG/ACT inhaler Inhale 2 puffs into the lungs 2 (two) times daily. 1 Inhaler 5  . calcium carbonate (OS-CAL) 600 MG TABS Take 600 mg by mouth daily.      . carvedilol (COREG) 6.25 MG tablet Take 1 tablet (6.25 mg total) by mouth daily. 90 tablet 1  . co-enzyme Q-10 30 MG capsule Take 30 mg by mouth daily.    Marland Kitchen DAILY MULTIPLE VITAMINS PO Take 1 tablet by mouth daily.      . fluticasone (VERAMYST) 27.5 MCG/SPRAY nasal spray Place 2 sprays into the nose as needed for rhinitis or allergies.     Marland Kitchen insulin lispro (HUMALOG) 100 UNIT/ML injection Inject into the skin as directed. Sliding scale 100-150 = 2-2.5 units 150-200 = 3-3.5 units 200-250 = 4-4.5 units 251-300 = 5-5.5 units 301-350 = 6-6.5 units 350-400 = 7-7.5 units    . insulin NPH (HUMULIN N,NOVOLIN N) 100 UNIT/ML injection Inject 22 Units into the skin daily before breakfast. 24 units at bedtime    . irbesartan (AVAPRO) 150 MG tablet Take 150 mg by mouth 2 (two) times daily.     . Magnesium 500 MG CAPS Take by mouth.    . metolazone (ZAROXOLYN) 2.5 MG tablet TAKE 1 TABLET BY MOUTH DAILY 30 MINUTES PRIOR TO TORSEMIDE 90 tablet 1  . mometasone (NASONEX)  50 MCG/ACT nasal spray Place 2 sprays into the nose daily as needed.     . nitroGLYCERIN (NITROSTAT) 0.4 MG SL tablet Place 1 tablet (0.4 mg total) under the tongue every 5 (five) minutes as needed. Maximum 3 tablets (Patient taking differently: Place 0.4 mg under the tongue every 5 (five) minutes as needed for chest pain. Maximum 3 tablets) 25 tablet 6  . ondansetron (ZOFRAN ODT) 4 MG disintegrating tablet Take 1 tablet (4 mg total) by mouth every 8 (eight) hours as needed for nausea or vomiting. 20 tablet 0  . oseltamivir (TAMIFLU) 75 MG capsule Take 1 capsule (75 mg total) by mouth 2 (two) times daily. 10 capsule 0  . sodium chloride (OCEAN) 0.65 % SOLN nasal spray Place 2 sprays into both nostrils as  needed for congestion.    Marland Kitchen Spacer/Aero-Holding Chambers (AEROCHAMBER MV) inhaler Use as instructed 1 each 0  . torsemide (DEMADEX) 5 MG tablet TAKE 1 TABLET BY MOUTH DAILY 90 tablet 1   Current Facility-Administered Medications  Medication Dose Route Frequency Provider Last Rate Last Dose  . albuterol (PROVENTIL HFA;VENTOLIN HFA) 108 (90 Base) MCG/ACT inhaler 2 puff  2 puff Inhalation Q4H PRN Byrnett, Forest Gleason, MD      . lactated ringers infusion   Intravenous Continuous Bary Castilla, Forest Gleason, MD      . ondansetron [facility]) 4 mg in sodium chloride 0.9 % 50 mL IVPB  4 mg Intravenous Q4H PRN Robert Bellow, MD      . traMADol Veatrice Bourbon) tablet 50 mg  50 mg Oral Q4H PRN Robert Bellow, MD        Past Medical History:  Diagnosis Date  . Backache, unspecified    s/p c-spine and l-spine fusions  . Degeneration of intervertebral disc, site unspecified   . Depressive disorder, not elsewhere classified   . Disorder of bone and cartilage, unspecified   . HTN (hypertension)   . Hx of left heart catheterization by cutdown    a. 7 years ago; no significant cad; b. Lexiscan 2014: no significant ischemia, no significant EKG changes concerning for ischemia, EF 67%, overall low risk study  . Kidney failure    Dr Cyndia Diver  . Myalgia and myositis, unspecified   . Obesity, unspecified   . Personal history of tobacco use, presenting hazards to health    1/2 ppd  . Plantar fascial fibromatosis   . S/P cholecystectomy   . S/p nephrectomy   . Type II or unspecified type diabetes mellitus without mention of complication, not stated as uncontrolled   . Unspecified essential hypertension   . Unspecified hereditary and idiopathic peripheral neuropathy    secondary to diabetes    Past Surgical History:  Procedure Laterality Date  . ABDOMINAL HYSTERECTOMY    . APPENDECTOMY    . BACK SURGERY     2006  . CARDIAC CATHETERIZATION     o.k. 2003  . CARPAL TUNNEL RELEASE    . CERVICAL DISCECTOMY    .  CHOLECYSTECTOMY    . COLONOSCOPY  09/1999   colonoscopy-hemorrhoids, re-check 5 years (10/2006)  . dexa     osteopenia (01/2004)  . ELBOW SURGERY    . FOOT SURGERY     x 3  . KIDNEY DONATION  1991  . LUMBAR FUSION    . NECK SURGERY     06/2005  . Problem with general Anesthesia     02/2006  . REPLACEMENT TOTAL KNEE  10/2004  . trigger finger surgery    .  VENTRAL HERNIA REPAIR N/A 09/08/2017   Primary repair of a 10 x 15 mm infraumbilical fascial defect.  Surgeon: Robert Bellow, MD;  Location: [facility] ORS;  Service: General;  Laterality: N/A;  . VESICOVAGINAL FISTULA CLOSURE W/ TAH    . VIDEO BRONCHOSCOPY Bilateral 04/02/2015   Procedure: VIDEO BRONCHOSCOPY WITHOUT FLUORO;  Surgeon: Juanito Doom, MD;  Location: [facility] ENDOSCOPY;  Service: Cardiopulmonary;  Laterality: Bilateral;    Social History        Tobacco Use  . Smoking status: Current Every Day Smoker    Packs/day: 0.25    Years: 30.00    Pack years: 7.50    Types: Cigarettes  . Smokeless tobacco: Never Used  . Tobacco comment: currently smoking .25 ppd, trying to quit 02/13/16  Substance Use Topics  . Alcohol use: No    Alcohol/week: 0.0 oz  . Drug use: No    Family History      Family History  Problem Relation Age of Onset  . Hypertension Mother   . Colon cancer Mother   . Cancer Mother        colon  . Coronary artery disease Mother   . Diabetes Mother   . Prostate cancer Father   . COPD Father   . Cancer Father        prostate  . Heart attack Brother 76  . Sleep apnea Brother   . Diabetes Paternal Aunt   . Diabetes Paternal Uncle   . Cancer Daughter        Brain, lungs, liver, spine, kidney         Allergies  Allergen Reactions  . Morphine Anaphylaxis  . Prednisone Other (See Comments)    "makes me think I am having a heart attack"  . Gabapentin Other (See Comments)    intolerance  . Nsaids Diarrhea  . Penicillins Swelling    Has patient had a PCN  reaction causing immediate rash, facial/tongue/throat swelling, SOB or lightheadedness with hypotension: Yes Has patient had a PCN reaction causing severe rash involving mucus membranes or skin necrosis: No Has patient had a PCN reaction that required hospitalization: No Has patient had a PCN reaction occurring within the last 10 years: No If all of the above answers are "NO", then may proceed with Cephalosporin use.     REVIEW OF SYSTEMS(Negative unless checked)  Constitutional: [] ?Weight loss[] ?Fever[] ?Chills Cardiac:[] ?Chest pain[] ?Chest pressure[] ?Palpitations [] ?Shortness of breath when laying flat [] ?Shortness of breath at rest [x] ?Shortness of breath with exertion. Vascular: [] ?Pain in legs with walking[] ?Pain in legsat rest[] ?Pain in legs when laying flat [] ?Claudication [] ?Pain in feet when walking [] ?Pain in feet at rest [] ?Pain in feet when laying flat [] ?History of DVT [] ?Phlebitis [x] ?Swelling in legs [] ?Varicose veins [] ?Non-healing ulcers Pulmonary: [] ?Uses home oxygen [] ?Productive cough[] ?Hemoptysis [] ?Wheeze [] ?COPD [] ?Asthma Neurologic: [] ?Dizziness [] ?Blackouts [] ?Seizures [] ?History of stroke [] ?History of TIA[] ?Aphasia [] ?Temporary blindness[] ?Dysphagia [] ?Weaknessor numbness in arms [] ?Weakness or numbnessin legs Musculoskeletal: [x] ?Arthritis [] ?Joint swelling [] ?Joint pain [x] ?Low back pain Hematologic:[x] ?Easy bruising[] ?Easy bleeding [] ?Hypercoagulable state [] ?Anemic [] ?Hepatitis Gastrointestinal:[] ?Blood in stool[] ?Vomiting blood[x] ?Gastroesophageal reflux/heartburn[] ?Abdominal pain Genitourinary: [x] ?Chronic kidney disease [] ?Difficulturination [] ?Frequenturination [] ?Burning with urination[] ?Hematuria Skin: [] ?Rashes [] ?Ulcers [] ?Wounds Psychological: [] ?History of anxiety    Physical Examination  BP (!) 165/76 (BP Location: Right Arm)    Pulse 73   Resp 16   Ht 5\' 2"  (1.575 m)   Wt 200 lb (90.7 kg)   BMI 36.58 kg/m  Gen:  WD/WN, NAD Head: Naomi/AT, No temporalis wasting. Ear/Nose/Throat: Hearing grossly intact, nares w/o erythema or drainage Eyes: Conjunctiva clear.  Sclera non-icteric Neck: Supple.  Trachea midline Pulmonary:  Good air movement, no use of accessory muscles.  Cardiac: RRR, no JVD Vascular:  Vessel Right Left  Radial Palpable Palpable                          PT  1+ palpable  1+ palpable  DP  2+ palpable  1+ palpable    Musculoskeletal: M/S 5/5 throughout.  No deformity or atrophy.  Walks with a cane.  No significant lower extremity edema. Neurologic: Sensation grossly intact in extremities.  Symmetrical.  Speech is fluent.  Psychiatric: Judgment intact, Mood & affect appropriate for pt's clinical situation. Dermatologic: No rashes or ulcers noted.  No cellulitis or open wounds.       Labs Recent Results (from the past 2160 hour(s))  POC Influenza A&B(BINAX/QUICKVUE)     Status: Normal   Collection Time: 12/28/18  8:51 AM  Result Value Ref Range   Influenza A, POC Negative Negative   Influenza B, POC Negative Negative  POCT rapid strep A     Status: Normal   Collection Time: 12/28/18  8:51 AM  Result Value Ref Range   Rapid Strep A Screen Negative Negative    Radiology No results found.  Assessment/Plan Essential hypertension blood pressure control important in reducing the progression of atherosclerotic disease. On appropriate oral medications.   Diabetes mellitus type 2 with complications, uncontrolled blood glucose control important in reducing the progression of atherosclerotic disease. Also, involved in wound healing. On appropriate medications.   CKD (chronic kidney disease), stage III Would plan limited contrast used with any intervention.  PAD (peripheral artery disease) (HCC) ABIs were done today. These demonstrate reasonably stable perfusion at 0.87 and 0.88  on the right and left respectively.  The waveforms are biphasic distally bilaterally.  Duplex does not show a focal stenosis in the right lower extremity.  In the mid left SFA there is a 75 to 99% stenosis. At this point, I do not think her worsening pain is due to worsening blood flow.  Her blood flow is stable from her previous studies and there is not been a major drop.  Her right leg is the more severely affected of the 2 legs and no obvious focal stenosis is seen.  At this point, I would continue her current medical regimen and do activity as tolerated.  Her pain may be arthritic from her joints or neuropathic from her back.  Either way, reperfusion is unlikely to be of great benefit at this time particularly given her renal insufficiency and the possibility of nephrotoxicity with angiography.  She will return in 6 months with noninvasive studies or sooner if problems develop in the interim.    Leotis Pain, MD  02/27/2019 3:39 PM    This note was created with Dragon medical transcription system.  Any errors from dictation are purely unintentional

## 2019-02-27 NOTE — Patient Instructions (Signed)
Peripheral Vascular Disease  Peripheral vascular disease (PVD) is a disease of the blood vessels that are not part of your heart and brain. A simple term for PVD is poor circulation. In most cases, PVD narrows the blood vessels that carry blood from your heart to the rest of your body. This can reduce the supply of blood to your arms, legs, and internal organs, like your stomach or kidneys. However, PVD most often affects a person's lower legs and feet. Without treatment, PVD tends to get worse. PVD can also lead to acute ischemic limb. This is when an arm or leg suddenly cannot get enough blood. This is a medical emergency. Follow these instructions at home: Lifestyle  Do not use any products that contain nicotine or tobacco, such as cigarettes and e-cigarettes. If you need help quitting, ask your doctor.  Lose weight if you are overweight. Or, stay at a healthy weight as told by your doctor.  Eat a diet that is low in fat and cholesterol. If you need help, ask your doctor.  Exercise regularly. Ask your doctor for activities that are right for you. General instructions  Take over-the-counter and prescription medicines only as told by your doctor.  Take good care of your feet: ? Wear comfortable shoes that fit well. ? Check your feet often for any cuts or sores.  Keep all follow-up visits as told by your doctor This is important. Contact a doctor if:  You have cramps in your legs when you walk.  You have leg pain when you are at rest.  You have coldness in a leg or foot.  Your skin changes.  You are unable to get or have an erection (erectile dysfunction).  You have cuts or sores on your feet that do not heal. Get help right away if:  Your arm or leg turns cold, numb, and blue.  Your arms or legs become red, warm, swollen, painful, or numb.  You have chest pain.  You have trouble breathing.  You suddenly have weakness in your face, arm, or leg.  You become very  confused or you cannot speak.  You suddenly have a very bad headache.  You suddenly cannot see. Summary  Peripheral vascular disease (PVD) is a disease of the blood vessels.  A simple term for PVD is poor circulation. Without treatment, PVD tends to get worse.  Treatment may include exercise, low fat and low cholesterol diet, and quitting smoking. This information is not intended to replace advice given to you by your health care provider. Make sure you discuss any questions you have with your health care provider. Document Released: 01/26/2010 Document Revised: 12/09/2016 Document Reviewed: 12/09/2016 Elsevier Interactive Patient Education  2019 Elsevier Inc.  

## 2019-03-05 DIAGNOSIS — E1165 Type 2 diabetes mellitus with hyperglycemia: Secondary | ICD-10-CM | POA: Diagnosis not present

## 2019-03-08 DIAGNOSIS — E1165 Type 2 diabetes mellitus with hyperglycemia: Secondary | ICD-10-CM | POA: Diagnosis not present

## 2019-03-08 DIAGNOSIS — G609 Hereditary and idiopathic neuropathy, unspecified: Secondary | ICD-10-CM | POA: Diagnosis not present

## 2019-03-30 ENCOUNTER — Telehealth: Payer: Self-pay

## 2019-03-30 NOTE — Telephone Encounter (Signed)
Virtual Visit Pre-Appointment Phone Call  "Brittania, I am calling you today to discuss your upcoming appointment. We are currently trying to limit exposure to the virus that causes COVID-19 by seeing patients at home rather than in the office."  1. "What is the BEST phone number to call the day of the visit?" - include this in appointment notes  2. "Do you have or have access to (through a family member/friend) a smartphone with video capability that we can use for your visit?" a. If yes - list this number in appt notes as "cell" (if different from BEST phone #) and list the appointment type as a VIDEO visit in appointment notes b. If no - list the appointment type as a PHONE visit in appointment notes  3. Confirm consent - "In the setting of the current Covid19 crisis, you are scheduled for a video visit with your provider on Apr 04, 2019 at 2:20PM.  Just as we do with many in-office visits, in order for you to participate in this visit, we must obtain consent.  If you'd like, I can send this to your mychart (if signed up) or email for you to review.  Otherwise, I can obtain your verbal consent now.  All virtual visits are billed to your insurance company just like a normal visit would be.  By agreeing to a virtual visit, we'd like you to understand that the technology does not allow for your provider to perform an examination, and thus may limit your provider's ability to fully assess your condition. If your provider identifies any concerns that need to be evaluated in person, we will make arrangements to do so.  Finally, though the technology is pretty good, we cannot assure that it will always work on either your or our end, and in the setting of a video visit, we may have to convert it to a phone-only visit.  In either situation, we cannot ensure that we have a secure connection.  Are you willing to proceed?" STAFF: Did the patient verbally acknowledge consent to telehealth visit? Document YES/NO  here: YES  4. Advise patient to be prepared - "Two hours prior to your appointment, go ahead and check your blood pressure, pulse, oxygen saturation, and your weight (if you have the equipment to check those) and write them all down. When your visit starts, your provider will ask you for this information. If you have an Apple Watch or Kardia device, please plan to have heart rate information ready on the day of your appointment. Please have a pen and paper handy nearby the day of the visit as well."  5. Give patient instructions for MyChart download to smartphone OR [email or web address] as below if video visit (depending on what platform provider is using)  6. Inform patient they will receive a phone call 15 minutes prior to their appointment time (may be from unknown caller ID) so they should be prepared to answer    TELEPHONE CALL NOTE  Andrea Stewart has been deemed a candidate for a follow-up tele-health visit to limit community exposure during the Covid-19 pandemic. I spoke with the patient via phone to ensure availability of phone/video source, confirm preferred email & phone number, and discuss instructions and expectations.  I reminded NOHEMI NICKLAUS to be prepared with any vital sign and/or heart rhythm information that could potentially be obtained via home monitoring, at the time of her visit. I reminded MIALEE WEYMAN to expect a phone call prior to  her visit.  Rene Paci McClain 03/30/2019 4:18 PM   INSTRUCTIONS FOR DOWNLOADING THE MYCHART APP TO SMARTPHONE  - The patient must first make sure to have activated MyChart and know their login information - If Apple, go to [employer] and type in MyChart in the search bar and download the app. If Android, ask patient to go to [employer] and type in [place] in the search bar and download the app. The app is free but as with any other app downloads, their phone may require them to verify saved payment information or  Apple/Android password.  - The patient will need to then log into the app with their MyChart username and password, and select [facility] as their healthcare provider to link the account. When it is time for your visit, go to the MyChart app, find appointments, and click Begin Video Visit. Be sure to Select Allow for your device to access the Microphone and Camera for your visit. You will then be connected, and your provider will be with you shortly.  **If they have any issues connecting, or need assistance please contact MyChart service desk [phone])**  **If using a computer, in order to ensure the best quality for their visit they will need to use either of the following Internet Browsers: [employer], or Google Chrome**  IF USING DOXIMITY or DOXY.ME - The patient will receive a link just prior to their visit by text.     FULL LENGTH CONSENT FOR TELE-HEALTH VISIT   I hereby voluntarily request, consent and authorize [facility] and its employed or contracted physicians, physician assistants, nurse practitioners or other licensed health care professionals (the Practitioner), to provide me with telemedicine health care services (the "Services") as deemed necessary by the treating Practitioner. I acknowledge and consent to receive the Services by the Practitioner via telemedicine. I understand that the telemedicine visit will involve communicating with the Practitioner through live audiovisual communication technology and the disclosure of certain medical information by electronic transmission. I acknowledge that I have been given the opportunity to request an in-person assessment or other available alternative prior to the telemedicine visit and am voluntarily participating in the telemedicine visit.  I understand that I have the right to withhold or withdraw my consent to the use of telemedicine in the course of my care at any time, without affecting my right to future care  or treatment, and that the Practitioner or I may terminate the telemedicine visit at any time. I understand that I have the right to inspect all information obtained and/or recorded in the course of the telemedicine visit and may receive copies of available information for a reasonable fee.  I understand that some of the potential risks of receiving the Services via telemedicine include:  Marland Kitchen Delay or interruption in medical evaluation due to technological equipment failure or disruption; . Information transmitted may not be sufficient (e.g. poor resolution of images) to allow for appropriate medical decision making by the Practitioner; and/or  . In rare instances, security protocols could fail, causing a breach of personal health information.  Furthermore, I acknowledge that it is my responsibility to provide information about my medical history, conditions and care that is complete and accurate to the best of my ability. I acknowledge that Practitioner's advice, recommendations, and/or decision may be based on factors not within their control, such as incomplete or inaccurate data provided by me or distortions of diagnostic images or specimens that may result from electronic transmissions.  I understand that the practice of medicine is not an exact science and that Practitioner makes no warranties or guarantees regarding treatment outcomes. I acknowledge that I will receive a copy of this consent concurrently upon execution via email to the email address I last provided but may also request a printed copy by calling the office of [facility].    I understand that my insurance will be billed for this visit.   I have read or had this consent read to me. . I understand the contents of this consent, which adequately explains the benefits and risks of the Services being provided via telemedicine.  . I have been provided ample opportunity to ask questions regarding this consent and the Services and have had  my questions answered to my satisfaction. . I give my informed consent for the services to be provided through the use of telemedicine in my medical care  By participating in this telemedicine visit I agree to the above.

## 2019-04-03 NOTE — Progress Notes (Signed)
Virtual Visit via Telephone Note   This visit type was conducted due to national recommendations for restrictions regarding the COVID-19 Pandemic (e.g. social distancing) in an effort to limit this patient's exposure and mitigate transmission in our community.  Due to her co-morbid illnesses, this patient is at least at moderate risk for complications without adequate follow up.  This format is felt to be most appropriate for this patient at this time.  The patient did not have access to video technology/had technical difficulties with video requiring transitioning to audio format only (telephone).  All issues noted in this document were discussed and addressed.  No physical exam could be performed with this format.  Please refer to the patient's chart for her  consent to telehealth for [facility].   I connected with  Andrea Stewart on 04/04/19 by a video enabled telemedicine application and verified that I am speaking with the correct person using two identifiers. I discussed the limitations of evaluation and management by telemedicine. The patient expressed understanding and agreed to proceed.   Evaluation Performed:  Follow-up visit  Date:  04/04/2019   ID:  Andrea Stewart, Sprecher 07/02/1946, MRN [id number]  Patient Location:  [place] [address]   Provider location:   [facility], [place] office  PCP:  Crecencio Mc, MD  Cardiologist:  Patsy Baltimore   Chief Complaint:  Hip pain   History of Present Illness:    Andrea Stewart is a 73 y.o. female who presents via audio/video conferencing for a telehealth visit today.   The patient does not symptoms concerning for COVID-19 infection (fever, chills, cough, or new SHORTNESS OF BREATH).   Patient has a past medical history of obesity,  COPD, smoking poorly controlled diabetes, followed by Dr. Soyla Murphy,  HBA1C 11 prior smoking history , Cardiac catheterization by report more than 7 years  ago that showed no significant CAD. 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  LE arterial disease, followed by dr. Lucky Cowboy, ABI 0.8 who presents for routine followup of her shortness of breath, hypertension.  Hip pain,seen by Dr. Carloyn Manner  Uses a walker, no regular exercise program No falls  Prednisone for lungs in early 2020 Sugars running high, HAB1C: 11  Followed by Dr. Lucky Cowboy No change in disease  Followed by [facility] Chronic renal dysfunction  Still smoking On lipitor Only taking coreg daily Takes torsemide daily  Quit smoking 3 days, now smoking again  will try to quit again  Denies any chest pain on exertion   history of leg cramping Very minimal leg swelling  Other past medical history reviewed hernia surgery October 2018  Previously required prednisone shot for COPD exacerbation, shortness of breath stress at home, daughter with cancer with metastases  bronchitis at the end of December 2015 with antibiotics. For shortness of breath, she was treated with Lasix 40 mg daily. Creatinine climbed up to 1.4 with elevated BUN.  History of leg cramping, She takes tonic water, mustard for relief.  She is unable to tolerate amlodipine secondary to leg swelling, Diovan HCT (leg cramping) losartan (patient self discontinued after no BP response in a weekend - though it would take longer to see a response), valsartan (uncertain reason), and clonidine (fatigue).    Prior CV studies:   The following studies were reviewed today:  Cardiac catheterization by report more than 7 years ago that showed no significant CAD. She was  previously seen by Dr. Olevia Perches.  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 Limited secondary to her arthritis conditions.   Past Medical History:  Diagnosis Date  . Backache, unspecified    s/p c-spine and l-spine fusions  .  Degeneration of intervertebral disc, site unspecified   . Depressive disorder, not elsewhere classified   . Disorder of bone and cartilage, unspecified   . HTN (hypertension)   . Hx of left heart catheterization by cutdown    a. 7 years ago; no significant cad; b. Lexiscan 2014: no significant ischemia, no significant EKG changes concerning for ischemia, EF 67%, overall low risk study  . Kidney failure    Dr Cyndia Diver  . Myalgia and myositis, unspecified   . Obesity, unspecified   . Personal history of tobacco use, presenting hazards to health    1/2 ppd  . Plantar fascial fibromatosis   . S/P cholecystectomy   . S/p nephrectomy   . Type II or unspecified type diabetes mellitus without mention of complication, not stated as uncontrolled   . Unspecified essential hypertension   . Unspecified hereditary and idiopathic peripheral neuropathy    secondary to diabetes   Past Surgical History:  Procedure Laterality Date  . ABDOMINAL HYSTERECTOMY    . APPENDECTOMY    . BACK SURGERY     2006  . CARDIAC CATHETERIZATION     o.k. 2003  . CARPAL TUNNEL RELEASE    . CERVICAL DISCECTOMY    . CHOLECYSTECTOMY    . COLONOSCOPY  09/1999   colonoscopy-hemorrhoids, re-check 5 years (10/2006)  . dexa     osteopenia (01/2004)  . ELBOW SURGERY    . FOOT SURGERY     x 3  . KIDNEY DONATION  1991  . LUMBAR FUSION    . NECK SURGERY     06/2005  . Problem with general Anesthesia     02/2006  . REPLACEMENT TOTAL KNEE  10/2004  . trigger finger surgery    . VENTRAL HERNIA REPAIR N/A 09/08/2017   Primary repair of a 10 x 15 mm infraumbilical fascial defect.  Surgeon: Robert Bellow, MD;  Location: [facility] ORS;  Service: General;  Laterality: N/A;  . VESICOVAGINAL FISTULA CLOSURE W/ TAH    . VIDEO BRONCHOSCOPY Bilateral 04/02/2015   Procedure: VIDEO BRONCHOSCOPY WITHOUT FLUORO;  Surgeon: Juanito Doom, MD;  Location: [facility] ENDOSCOPY;  Service: Cardiopulmonary;  Laterality: Bilateral;     Current  Meds  Medication Sig  . albuterol (PROVENTIL HFA) 108 (90 Base) MCG/ACT inhaler Inhale 2 puffs every 4 (four) hours as needed into the lungs for wheezing or shortness of breath.  Marland Kitchen albuterol (PROVENTIL) (2.5 MG/3ML) 0.083% nebulizer solution Take 3 mLs (2.5 mg total) by nebulization every 4 (four) hours as needed for wheezing or shortness of breath.  Marland Kitchen amLODipine (NORVASC) 10 MG tablet TAKE 1 TABLET BY MOUTH DAILY  . aspirin 81 MG tablet Take 1 tablet (81 mg total) by mouth daily.  Marland Kitchen atorvastatin (LIPITOR) 20 MG tablet TAKE 1 TABLET BY MOUTH DAILY  . budesonide (PULMICORT) 0.25 MG/2ML nebulizer solution Take 2 mLs (0.25 mg total) by nebulization 2 (two) times daily.  . budesonide-formoterol (SYMBICORT) 160-4.5 MCG/ACT inhaler Inhale 2 puffs into the lungs 2 (two) times daily.  . calcium carbonate (OS-CAL) 600 MG TABS Take 600 mg by mouth daily.    . carvedilol (COREG) 3.125 MG tablet Take 3.125  mg by mouth daily.  Marland Kitchen DAILY MULTIPLE VITAMINS PO Take 1 tablet by mouth daily.    . insulin lispro (HUMALOG) 100 UNIT/ML injection Inject into the skin as directed. Sliding scale 100-150 = 2-2.5 units 150-200 = 3-3.5 units 200-250 = 4-4.5 units 251-300 = 5-5.5 units 301-350 = 6-6.5 units 350-400 = 7-7.5 units  . insulin NPH (HUMULIN N,NOVOLIN N) 100 UNIT/ML injection Inject 22 Units into the skin daily before breakfast. 24 units at bedtime  . irbesartan (AVAPRO) 150 MG tablet Take 150 mg by mouth 2 (two) times daily.   . Magnesium 500 MG CAPS Take by mouth.  . metolazone (ZAROXOLYN) 2.5 MG tablet TAKE 1 TABLET BY MOUTH DAILY 30 MINUTES PRIOR TO TORSEMIDE  . nitroGLYCERIN (NITROSTAT) 0.4 MG SL tablet Place 1 tablet (0.4 mg total) under the tongue every 5 (five) minutes as needed. Maximum 3 tablets (Patient taking differently: Place 0.4 mg under the tongue every 5 (five) minutes as needed for chest pain. Maximum 3 tablets)  . Spacer/Aero-Holding Chambers (AEROCHAMBER MV) inhaler Use as instructed  .  torsemide (DEMADEX) 5 MG tablet TAKE 1 TABLET BY MOUTH DAILY   Current Facility-Administered Medications for the 04/04/19 encounter (Appointment) with Minna Merritts, MD  Medication  . albuterol (PROVENTIL HFA;VENTOLIN HFA) 108 (90 Base) MCG/ACT inhaler 2 puff  . lactated ringers infusion  . ondansetron (ZOFRAN) 4 mg in sodium chloride 0.9 % 50 mL IVPB  . traMADol (ULTRAM) tablet 50 mg     Allergies:   Morphine; Prednisone; Gabapentin; Nsaids; and Penicillins   Social History   Tobacco Use  . Smoking status: Current Every Day Smoker    Packs/day: 0.25    Years: 30.00    Pack years: 7.50    Types: Cigarettes  . Smokeless tobacco: Never Used  . Tobacco comment: currently smoking .25 ppd, trying to quit 02/13/16  Substance Use Topics  . Alcohol use: No    Alcohol/week: 0.0 standard drinks  . Drug use: No     Current Outpatient Medications on File Prior to Visit  Medication Sig Dispense Refill  . albuterol (PROVENTIL HFA) 108 (90 Base) MCG/ACT inhaler Inhale 2 puffs every 4 (four) hours as needed into the lungs for wheezing or shortness of breath. 1 Inhaler 2  . albuterol (PROVENTIL) (2.5 MG/3ML) 0.083% nebulizer solution Take 3 mLs (2.5 mg total) by nebulization every 4 (four) hours as needed for wheezing or shortness of breath. 150 mL 1  . amLODipine (NORVASC) 10 MG tablet TAKE 1 TABLET BY MOUTH DAILY 90 tablet 1  . aspirin 81 MG tablet Take 1 tablet (81 mg total) by mouth daily.    Marland Kitchen atorvastatin (LIPITOR) 20 MG tablet TAKE 1 TABLET BY MOUTH DAILY 90 tablet 1  . budesonide (PULMICORT) 0.25 MG/2ML nebulizer solution Take 2 mLs (0.25 mg total) by nebulization 2 (two) times daily. 60 mL 12  . budesonide-formoterol (SYMBICORT) 160-4.5 MCG/ACT inhaler Inhale 2 puffs into the lungs 2 (two) times daily. 1 Inhaler 5  . calcium carbonate (OS-CAL) 600 MG TABS Take 600 mg by mouth daily.      . carvedilol (COREG) 3.125 MG tablet Take 3.125 mg by mouth daily.    Marland Kitchen DAILY MULTIPLE VITAMINS  PO Take 1 tablet by mouth daily.      . insulin lispro (HUMALOG) 100 UNIT/ML injection Inject into the skin as directed. Sliding scale 100-150 = 2-2.5 units 150-200 = 3-3.5 units 200-250 = 4-4.5 units 251-300 = 5-5.5 units 301-350 = 6-6.5 units 350-400 =  7-7.5 units    . insulin NPH (HUMULIN N,NOVOLIN N) 100 UNIT/ML injection Inject 22 Units into the skin daily before breakfast. 24 units at bedtime    . irbesartan (AVAPRO) 150 MG tablet Take 150 mg by mouth 2 (two) times daily.     . Magnesium 500 MG CAPS Take by mouth.    . metolazone (ZAROXOLYN) 2.5 MG tablet TAKE 1 TABLET BY MOUTH DAILY 30 MINUTES PRIOR TO TORSEMIDE 90 tablet 1  . nitroGLYCERIN (NITROSTAT) 0.4 MG SL tablet Place 1 tablet (0.4 mg total) under the tongue every 5 (five) minutes as needed. Maximum 3 tablets (Patient taking differently: Place 0.4 mg under the tongue every 5 (five) minutes as needed for chest pain. Maximum 3 tablets) 25 tablet 6  . Spacer/Aero-Holding Chambers (AEROCHAMBER MV) inhaler Use as instructed 1 each 0  . torsemide (DEMADEX) 5 MG tablet TAKE 1 TABLET BY MOUTH DAILY 90 tablet 1  . [DISCONTINUED] valsartan-hydrochlorothiazide (DIOVAN-HCT) 320-25 MG per tablet TAKE ONE (1) TABLET EACH DAY 30 tablet 6   Current Facility-Administered Medications on File Prior to Visit  Medication Dose Route Frequency Provider Last Rate Last Dose  . albuterol (PROVENTIL HFA;VENTOLIN HFA) 108 (90 Base) MCG/ACT inhaler 2 puff  2 puff Inhalation Q4H PRN Byrnett, Forest Gleason, MD      . lactated ringers infusion   Intravenous Continuous Bary Castilla, Forest Gleason, MD      . ondansetron [facility]) 4 mg in sodium chloride 0.9 % 50 mL IVPB  4 mg Intravenous Q4H PRN Robert Bellow, MD      . traMADol Veatrice Bourbon) tablet 50 mg  50 mg Oral Q4H PRN Byrnett, Forest Gleason, MD         Family Hx: The patient's family history includes COPD in her father; Cancer in her daughter, father, and mother; Colon cancer in her mother; Coronary artery disease in her  mother; Diabetes in her brother, mother, paternal aunt, and paternal uncle; Heart attack (age of onset: 6) in her brother; Hypertension in her mother; Prostate cancer in her father; Sleep apnea in her brother.  ROS:   Please see the history of present illness.    Review of Systems  Constitutional: Negative.   HENT: Negative.   Respiratory: Positive for shortness of breath.   Cardiovascular: Negative.   Gastrointestinal: Negative.   Musculoskeletal: Positive for joint pain.  Neurological: Negative.   Psychiatric/Behavioral: Negative.   All other systems reviewed and are negative.    Labs/Other Tests and Data Reviewed:    Recent Labs: 07/31/2018: BUN 37; Creatinine 2.1; Hemoglobin 12.1; Platelets 209; Potassium 4.3; Sodium 139   Recent Lipid Panel Lab Results  Component Value Date/Time   CHOL 136 08/25/2017 09:12 AM   TRIG 111.0 08/25/2017 09:12 AM   HDL 60.30 08/25/2017 09:12 AM   CHOLHDL 2 08/25/2017 09:12 AM   LDLCALC 53 08/25/2017 09:12 AM   LDLDIRECT 51.9 09/07/2012 11:02 AM    Wt Readings from Last 3 Encounters:  02/27/19 200 lb (90.7 kg)  01/08/19 198 lb 6.4 oz (90 kg)  12/28/18 202 lb 6.4 oz (91.8 kg)     Exam:    Vital Signs: Vital signs may also be detailed in the HPI There were no vitals taken for this visit.  Wt Readings from Last 3 Encounters:  02/27/19 200 lb (90.7 kg)  01/08/19 198 lb 6.4 oz (90 kg)  12/28/18 202 lb 6.4 oz (91.8 kg)   Temp Readings from Last 3 Encounters:  01/08/19 98.1 F (36.7 C) (  Oral)  12/28/18 99.8 F (37.7 C) (Oral)  11/28/18 99.1 F (37.3 C) (Oral)   BP Readings from Last 3 Encounters:  02/27/19 (!) 165/76  01/08/19 (!) 148/74  12/28/18 (!) 156/82   Pulse Readings from Last 3 Encounters:  02/27/19 73  01/08/19 84  12/28/18 90    140-190 using a wrist cuff   Well nourished, well developed female in no acute distress. Constitutional:  oriented to person, place, and time. No distress.    ASSESSMENT & PLAN:     PAD (peripheral artery disease) ([place]) Followed by Dr. Lucky Cowboy, Smoking cessation recommended  Morbid obesity ([place]) We have encouraged continued exercise, careful diet management in an effort to lose weight.  Claudication ([place]) Stable sx, HBA1C 11 Discussed with her  Tobacco use We have encouraged her to continue to work on weaning her cigarettes and smoking cessation. She will continue to work on this and does not want any assistance with chantix.   Essential hypertension We will increase carvedilol up to 3.125 mg twice daily She is only taking this daily If blood pressure continues to run high in the next week or 2, we will increase up to 6.25 mg twice daily  CKD (chronic kidney disease), stage III ([place]) Followed by nephrology, recommended smoking cessation, better blood pressure control, better diabetes control  Mixed hyperlipidemia Cholesterol is at goal on the current lipid regimen. No changes to the medications were made.  COPD (chronic obstructive pulmonary disease) with chronic bronchitis (HCC) Continues to smoke, had severe COPD early 2020 requiring prednisone per her report   COVID-19 Education: The signs and symptoms of COVID-19 were discussed with the patient and how to seek care for testing (follow up with PCP or arrange E-visit).  The importance of social distancing was discussed today.  Patient Risk:   After full review of this patients clinical status, I feel that they are at least moderate risk at this time.  Time:   Today, I have spent 25 minutes with the patient with telehealth technology discussing the cardiac and medical problems/diagnoses detailed above   10 min spent reviewing the chart prior to patient visit today   Medication Adjustments/Labs and Tests Ordered: Current medicines are reviewed at length with the patient today.  Concerns regarding medicines are outlined above.   Tests Ordered: No tests ordered   Medication Changes: No changes made    Disposition: Follow-up in 6 months   Signed, Ida Rogue, MD  04/04/2019 2:38 PM    [place] Office [address] [place] [address], [place], [place] [address]

## 2019-04-04 ENCOUNTER — Other Ambulatory Visit: Payer: Self-pay

## 2019-04-04 ENCOUNTER — Telehealth (INDEPENDENT_AMBULATORY_CARE_PROVIDER_SITE_OTHER): Payer: PPO | Admitting: Cardiovascular Disease

## 2019-04-04 DIAGNOSIS — Z720 Tobacco use: Secondary | ICD-10-CM

## 2019-04-04 DIAGNOSIS — E782 Mixed hyperlipidemia: Secondary | ICD-10-CM | POA: Diagnosis not present

## 2019-04-04 DIAGNOSIS — I739 Peripheral vascular disease, unspecified: Secondary | ICD-10-CM | POA: Diagnosis not present

## 2019-04-04 DIAGNOSIS — E1165 Type 2 diabetes mellitus with hyperglycemia: Secondary | ICD-10-CM | POA: Diagnosis not present

## 2019-04-04 DIAGNOSIS — I10 Essential (primary) hypertension: Secondary | ICD-10-CM

## 2019-04-04 DIAGNOSIS — N183 Chronic kidney disease, stage 3 unspecified: Secondary | ICD-10-CM

## 2019-04-04 DIAGNOSIS — J449 Chronic obstructive pulmonary disease, unspecified: Secondary | ICD-10-CM

## 2019-04-04 MED ORDER — CARVEDILOL 3.125 MG PO TABS: 3.1250 mg | ORAL_TABLET | Freq: Two times a day (BID) | ORAL | 3 refills | Status: DC

## 2019-04-04 NOTE — Patient Instructions (Addendum)
Medication Instructions:  Your physician has recommended you make the following change in your medication:  1. INCREASE Carvedilol 3.125 mg take 1 tablet twice a day  Please call in 1-2 weeks if your blood pressures remain elevated. Keep a log of your blood pressure readings. See below for information about blood pressures and call us if they remain elevated. Please make sure to sit for 10 minutes, no talking, and no legs crossed.   If you need a refill on your cardiac medications before your next appointment, please call your pharmacy.    Lab work: No new labs needed   If you have labs (blood work) drawn today and your tests are completely normal, you will receive your results only by: Marland Kitchen MyChart Message (if you have MyChart) OR . A paper copy in the mail If you have any lab test that is abnormal or we need to change your treatment, we will call you to review the results.   Testing/Procedures: No new testing needed   Follow-Up: At [facility], you and your health needs are our priority.  As part of our continuing mission to provide you with exceptional heart care, we have created designated Provider Care Teams.  These Care Teams include your primary Cardiologist (physician) and Advanced Practice Providers (APPs -  Physician Assistants and Nurse Practitioners) who all work together to provide you with the care you need, when you need it.  . You will need a follow up appointment in 12 months .   Please call our office 2 months in advance to schedule this appointment.    . Providers on your designated Care Team:   . Murray Hodgkins, NP . Christell Faith, PA-C . Marrianne Mood, PA-C  Any Other Special Instructions Will Be Listed Below (If Applicable).  For educational health videos Log in to : www.myemmi.com Or : [email or web address], password : [email or web address]   How to Take Your Blood Pressure You can take your blood pressure at home with a machine. You may need to check your blood pressure  at home:  To check if you have high blood pressure (hypertension).  To check your blood pressure over time.  To make sure your blood pressure medicine is working. Supplies needed: You will need a blood pressure machine, or monitor. You can buy one at a drugstore or online. When choosing one:  Choose one with an arm cuff.  Choose one that wraps around your upper arm. Only one finger should fit between your arm and the cuff.  Do not choose one that measures your blood pressure from your wrist or finger. Your doctor can suggest a monitor. How to prepare Avoid these things for 30 minutes before checking your blood pressure:  Drinking caffeine.  Drinking alcohol.  Eating.  Smoking.  Exercising. Five minutes before checking your blood pressure:  Pee.  Sit in a dining chair. Avoid sitting in a soft couch or armchair.  Be quiet. Do not talk. How to take your blood pressure Follow the instructions that came with your machine. If you have a digital blood pressure monitor, these may be the instructions: 1. Sit up straight. 2. Place your feet on the floor. Do not cross your ankles or legs. 3. Rest your left arm at the level of your heart. You may rest it on a table, desk, or chair. 4. Pull up your shirt sleeve. 5. Wrap the blood pressure cuff around the upper part of your left arm. The cuff should be 1 inch (2.5 cm)  above your elbow. It is best to wrap the cuff around bare skin. 6. Fit the cuff snugly around your arm. You should be able to place only one finger between the cuff and your arm. 7. Put the cord inside the groove of your elbow. 8. Press the power button. 9. Sit quietly while the cuff fills with air and loses air. 10. Write down the numbers on the screen. 11. Wait 2-3 minutes and then repeat steps 1-10. What do the numbers mean? Two numbers make up your blood pressure. The first number is called systolic pressure. The second is called diastolic pressure. An example of  a blood pressure reading is "120 over 80" (or 120/80). If you are an adult and do not have a medical condition, use this guide to find out if your blood pressure is normal: Normal  First number: below 120.  Second number: below 80. Elevated  First number: 120-129.  Second number: below 80. Hypertension stage 1  First number: 130-139.  Second number: 80-89. Hypertension stage 2  First number: 140 or above.  Second number: 37 or above. Your blood pressure is above normal even if only the top or bottom number is above normal. Follow these instructions at home:  Check your blood pressure as often as your doctor tells you to.  Take your monitor to your next doctor's appointment. Your doctor will: ? Make sure you are using it correctly. ? Make sure it is working right.  Make sure you understand what your blood pressure numbers should be.  Tell your doctor if your medicines are causing side effects. Contact a doctor if:  Your blood pressure keeps being high. Get help right away if:  Your first blood pressure number is higher than 180.  Your second blood pressure number is higher than 120. This information is not intended to replace advice given to you by your health care provider. Make sure you discuss any questions you have with your health care provider. Document Released: 10/14/2008 Document Revised: 09/29/2016 Document Reviewed: 04/09/2016 Elsevier Interactive Patient Education  2019 Elsevier Inc.  Blood Pressure Record Sheet To take your blood pressure, you will need a blood pressure machine. You can buy a blood pressure machine (blood pressure monitor) at your clinic, drug store, or online. When choosing one, consider:  An automatic monitor that has an arm cuff.  A cuff that wraps snugly around your upper arm. You should be able to fit only one finger between your arm and the cuff.  A device that stores blood pressure reading results.  Do not choose a monitor  that measures your blood pressure from your wrist or finger. Follow your health care provider's instructions for how to take your blood pressure. To use this form:  Get one reading in the morning (a.m.) before you take any medicines.  Get one reading in the evening (p.m.) before supper.  Take at least 2 readings with each blood pressure check. This makes sure the results are correct. Wait 1-2 minutes between measurements.  Write down the results in the spaces on this form.  Repeat this once a week, or as told by your health care provider.  Make a follow-up appointment with your health care provider to discuss the results. Blood pressure log Date: _______________________  a.m. _____________________(1st reading) _____________________(2nd reading)  p.m. _____________________(1st reading) _____________________(2nd reading) Date: _______________________  a.m. _____________________(1st reading) _____________________(2nd reading)  p.m. _____________________(1st reading) _____________________(2nd reading) Date: _______________________  a.m. _____________________(1st reading) _____________________(2nd reading)  p.m. _____________________(1st reading)  _____________________(2nd reading) Date: _______________________  a.m. _____________________(1st reading) _____________________(2nd reading)  p.m. _____________________(1st reading) _____________________(2nd reading) Date: _______________________  a.m. _____________________(1st reading) _____________________(2nd reading)  p.m. _____________________(1st reading) _____________________(2nd reading) This information is not intended to replace advice given to you by your health care provider. Make sure you discuss any questions you have with your health care provider. Document Released: 07/31/2003 Document Revised: 11/01/2017 Document Reviewed: 11/01/2017 Elsevier Interactive Patient Education  2019 [employer].

## 2019-04-11 ENCOUNTER — Other Ambulatory Visit: Payer: Self-pay

## 2019-04-11 ENCOUNTER — Ambulatory Visit
Admission: RE | Admit: 2019-04-11 | Discharge: 2019-04-11 | Disposition: A | Discharge status: Home / Self Care | Payer: PPO | Source: Ambulatory Visit | Attending: Internal Medicine | Admitting: Internal Medicine

## 2019-04-11 ENCOUNTER — Telehealth: Payer: Self-pay | Admitting: *Deleted

## 2019-04-11 DIAGNOSIS — Z1231 Encounter for screening mammogram for malignant neoplasm of breast: Secondary | ICD-10-CM | POA: Diagnosis not present

## 2019-04-11 MED ORDER — CARVEDILOL 3.125 MG PO TABS: 3.1250 mg | ORAL_TABLET | Freq: Two times a day (BID) | ORAL | 3 refills | Status: DC

## 2019-04-11 NOTE — Telephone Encounter (Signed)
*  STAT* If patient is at the pharmacy, call can be transferred to refill team.   1. Which medications need to be refilled? (please list name of each medication and dose if known) carvedilol 3.125 mg by mouth two times a day  2. Which pharmacy/location (including street and city if local pharmacy) is medication to be sent to?[facility] *  3. Do they need a 30 day or 90 day supply?  90   While talking to patient on the phone about her husband who is also a patient, she asked for refills. Refill sent.

## 2019-05-07 DIAGNOSIS — E1165 Type 2 diabetes mellitus with hyperglycemia: Secondary | ICD-10-CM | POA: Diagnosis not present

## 2019-05-11 ENCOUNTER — Telehealth: Payer: Self-pay

## 2019-05-11 NOTE — Telephone Encounter (Signed)
Call pt regarding lung screening. Pt is a current smoker, smoking about 1/2 pack per day. Would like scan in the afternoon. Pt denies any new health issues except she is fighting with diabetes.

## 2019-05-14 ENCOUNTER — Telehealth: Payer: Self-pay | Admitting: *Deleted

## 2019-05-14 DIAGNOSIS — Z87891 Personal history of nicotine dependence: Secondary | ICD-10-CM

## 2019-05-14 DIAGNOSIS — Z122 Encounter for screening for malignant neoplasm of respiratory organs: Secondary | ICD-10-CM

## 2019-05-14 NOTE — Telephone Encounter (Signed)
Patient has been notified that annual lung cancer screening low dose CT scan is due currently or will be in near future. Confirmed that patient is within the age range of 55-77, and asymptomatic, (no signs or symptoms of lung cancer). Patient denies illness that would prevent curative treatment for lung cancer if found. Verified smoking history, (current, 31.75 pack year). The shared decision making visit was done 01/02/16. Patient is agreeable for CT scan being scheduled.

## 2019-05-21 ENCOUNTER — Ambulatory Visit: Admission: RE | Admit: 2019-05-21 | Payer: PPO | Source: Ambulatory Visit

## 2019-05-25 ENCOUNTER — Ambulatory Visit
Admission: RE | Admit: 2019-05-25 | Discharge: 2019-05-25 | Disposition: A | Discharge status: Home / Self Care | Payer: PPO | Source: Ambulatory Visit | Attending: Nurse Practitioner | Admitting: Nurse Practitioner

## 2019-05-25 ENCOUNTER — Other Ambulatory Visit: Payer: Self-pay

## 2019-05-25 DIAGNOSIS — Z87891 Personal history of nicotine dependence: Secondary | ICD-10-CM | POA: Insufficient documentation

## 2019-05-25 DIAGNOSIS — Z122 Encounter for screening for malignant neoplasm of respiratory organs: Secondary | ICD-10-CM

## 2019-05-25 DIAGNOSIS — F17210 Nicotine dependence, cigarettes, uncomplicated: Secondary | ICD-10-CM | POA: Diagnosis not present

## 2019-05-28 ENCOUNTER — Encounter: Payer: Self-pay | Admitting: *Deleted

## 2019-05-29 DIAGNOSIS — R809 Proteinuria, unspecified: Secondary | ICD-10-CM | POA: Diagnosis not present

## 2019-05-29 DIAGNOSIS — E1122 Type 2 diabetes mellitus with diabetic chronic kidney disease: Secondary | ICD-10-CM | POA: Diagnosis not present

## 2019-05-29 DIAGNOSIS — R319 Hematuria, unspecified: Secondary | ICD-10-CM | POA: Diagnosis not present

## 2019-05-29 DIAGNOSIS — N184 Chronic kidney disease, stage 4 (severe): Secondary | ICD-10-CM | POA: Diagnosis not present

## 2019-06-04 ENCOUNTER — Encounter: Payer: Self-pay | Admitting: Internal Medicine

## 2019-06-04 DIAGNOSIS — N184 Chronic kidney disease, stage 4 (severe): Secondary | ICD-10-CM | POA: Diagnosis not present

## 2019-06-04 DIAGNOSIS — R809 Proteinuria, unspecified: Secondary | ICD-10-CM | POA: Diagnosis not present

## 2019-06-04 DIAGNOSIS — E1122 Type 2 diabetes mellitus with diabetic chronic kidney disease: Secondary | ICD-10-CM | POA: Diagnosis not present

## 2019-06-04 DIAGNOSIS — I129 Hypertensive chronic kidney disease with stage 1 through stage 4 chronic kidney disease, or unspecified chronic kidney disease: Secondary | ICD-10-CM | POA: Diagnosis not present

## 2019-06-06 DIAGNOSIS — E1165 Type 2 diabetes mellitus with hyperglycemia: Secondary | ICD-10-CM | POA: Diagnosis not present

## 2019-06-18 ENCOUNTER — Other Ambulatory Visit: Payer: Self-pay | Admitting: Internal Medicine

## 2019-06-19 ENCOUNTER — Other Ambulatory Visit: Payer: Self-pay

## 2019-06-19 DIAGNOSIS — Z20822 Contact with and (suspected) exposure to covid-19: Secondary | ICD-10-CM

## 2019-06-19 DIAGNOSIS — R6889 Other general symptoms and signs: Secondary | ICD-10-CM | POA: Diagnosis not present

## 2019-06-20 LAB — NOVEL CORONAVIRUS, NAA: SARS-CoV-2, NAA: NOT DETECTED

## 2019-06-26 ENCOUNTER — Ambulatory Visit (INDEPENDENT_AMBULATORY_CARE_PROVIDER_SITE_OTHER): Payer: PPO | Admitting: Vascular Surgery

## 2019-06-26 ENCOUNTER — Encounter (INDEPENDENT_AMBULATORY_CARE_PROVIDER_SITE_OTHER): Payer: PPO

## 2019-07-09 DIAGNOSIS — G609 Hereditary and idiopathic neuropathy, unspecified: Secondary | ICD-10-CM | POA: Diagnosis not present

## 2019-07-09 DIAGNOSIS — R809 Proteinuria, unspecified: Secondary | ICD-10-CM | POA: Diagnosis not present

## 2019-07-09 DIAGNOSIS — E1165 Type 2 diabetes mellitus with hyperglycemia: Secondary | ICD-10-CM | POA: Diagnosis not present

## 2019-07-09 DIAGNOSIS — I10 Essential (primary) hypertension: Secondary | ICD-10-CM | POA: Diagnosis not present

## 2019-07-09 DIAGNOSIS — E7800 Pure hypercholesterolemia, unspecified: Secondary | ICD-10-CM | POA: Diagnosis not present

## 2019-07-09 LAB — HEMOGLOBIN A1C: Hemoglobin A1C: 8.5

## 2019-07-10 DIAGNOSIS — E1165 Type 2 diabetes mellitus with hyperglycemia: Secondary | ICD-10-CM | POA: Diagnosis not present

## 2019-07-12 ENCOUNTER — Telehealth: Payer: Self-pay | Admitting: Pulmonary Disease

## 2019-07-12 MED ORDER — BUDESONIDE-FORMOTEROL FUMARATE 160-4.5 MCG/ACT IN AERO: 2.0000 | INHALATION_SPRAY | Freq: Two times a day (BID) | RESPIRATORY_TRACT | 5 refills | Status: DC

## 2019-07-12 NOTE — Telephone Encounter (Signed)
Spoke with patient. She stated that she needed refills on her Symbicort. Patient would like to have the RX mailed to her as she gets her medications from [place]. Verified address. Advised her that I would place the RX in the mail today. She verbalized understanding.   Nothing further needed.

## 2019-07-19 ENCOUNTER — Other Ambulatory Visit: Payer: Self-pay

## 2019-08-10 ENCOUNTER — Other Ambulatory Visit: Payer: Self-pay

## 2019-08-10 ENCOUNTER — Ambulatory Visit (INDEPENDENT_AMBULATORY_CARE_PROVIDER_SITE_OTHER): Payer: PPO

## 2019-08-10 DIAGNOSIS — E1165 Type 2 diabetes mellitus with hyperglycemia: Secondary | ICD-10-CM | POA: Diagnosis not present

## 2019-08-10 DIAGNOSIS — Z23 Encounter for immunization: Secondary | ICD-10-CM | POA: Diagnosis not present

## 2019-08-24 ENCOUNTER — Other Ambulatory Visit: Payer: Self-pay

## 2019-08-24 ENCOUNTER — Other Ambulatory Visit: Payer: Self-pay | Admitting: Internal Medicine

## 2019-08-28 ENCOUNTER — Other Ambulatory Visit: Payer: Self-pay | Admitting: Internal Medicine

## 2019-08-31 ENCOUNTER — Encounter (INDEPENDENT_AMBULATORY_CARE_PROVIDER_SITE_OTHER): Payer: PPO

## 2019-08-31 ENCOUNTER — Ambulatory Visit (INDEPENDENT_AMBULATORY_CARE_PROVIDER_SITE_OTHER): Payer: PPO | Admitting: Nurse Practitioner

## 2019-09-04 DIAGNOSIS — R319 Hematuria, unspecified: Secondary | ICD-10-CM | POA: Diagnosis not present

## 2019-09-04 DIAGNOSIS — N184 Chronic kidney disease, stage 4 (severe): Secondary | ICD-10-CM | POA: Diagnosis not present

## 2019-09-04 DIAGNOSIS — E1122 Type 2 diabetes mellitus with diabetic chronic kidney disease: Secondary | ICD-10-CM | POA: Diagnosis not present

## 2019-09-04 DIAGNOSIS — R809 Proteinuria, unspecified: Secondary | ICD-10-CM | POA: Diagnosis not present

## 2019-09-05 ENCOUNTER — Other Ambulatory Visit: Payer: Self-pay

## 2019-09-05 ENCOUNTER — Encounter (INDEPENDENT_AMBULATORY_CARE_PROVIDER_SITE_OTHER): Payer: Self-pay | Admitting: Nurse Practitioner

## 2019-09-05 ENCOUNTER — Ambulatory Visit (INDEPENDENT_AMBULATORY_CARE_PROVIDER_SITE_OTHER): Payer: PPO | Admitting: Nurse Practitioner

## 2019-09-05 ENCOUNTER — Ambulatory Visit (INDEPENDENT_AMBULATORY_CARE_PROVIDER_SITE_OTHER): Payer: PPO

## 2019-09-05 VITALS — BP 172/72 | HR 80 | Resp 16 | Wt 198.0 lb

## 2019-09-05 DIAGNOSIS — I739 Peripheral vascular disease, unspecified: Secondary | ICD-10-CM

## 2019-09-05 DIAGNOSIS — J449 Chronic obstructive pulmonary disease, unspecified: Secondary | ICD-10-CM | POA: Diagnosis not present

## 2019-09-05 DIAGNOSIS — F17200 Nicotine dependence, unspecified, uncomplicated: Secondary | ICD-10-CM

## 2019-09-05 DIAGNOSIS — I70212 Atherosclerosis of native arteries of extremities with intermittent claudication, left leg: Secondary | ICD-10-CM

## 2019-09-09 ENCOUNTER — Encounter (INDEPENDENT_AMBULATORY_CARE_PROVIDER_SITE_OTHER): Payer: Self-pay | Admitting: Nurse Practitioner

## 2019-09-09 NOTE — Progress Notes (Signed)
SUBJECTIVE:  Patient ID: Andrea Stewart, female    DOB: [birth year], 73 y.o.   MRN: [id number] Chief Complaint  Patient presents with  . Follow-up    ultrasound follow up    HPI  Andrea Stewart is a 73 y.o. female The patient returns to the office for followup and review of the noninvasive studies. There has been a deterioration in the lower extremity symptoms.  The patient notes interval shortening of their claudication distance and development of mild rest pain symptoms. No new ulcers or wounds have occurred since the last visit.  There have been no significant changes to the patient's overall health care.  The patient denies amaurosis fugax or recent TIA symptoms. There are no recent neurological changes noted. The patient denies history of DVT, PE or superficial thrombophlebitis. The patient denies recent episodes of angina or shortness of breath.   ABI's Rt=0.84  and Lt=0.65 (previous ABI's Rt=0.87 and Lt=0.88) Duplex US of the lower extremity arterial system shows biphasic waveforms in the bilateral tibial arteries and strong toe waveforms.   Past Medical History:  Diagnosis Date  . Backache, unspecified    s/p c-spine and l-spine fusions  . Degeneration of intervertebral disc, site unspecified   . Depressive disorder, not elsewhere classified   . Disorder of bone and cartilage, unspecified   . HTN (hypertension)   . Hx of left heart catheterization by cutdown    a. 7 years ago; no significant cad; b. Lexiscan 2014: no significant ischemia, no significant EKG changes concerning for ischemia, EF 67%, overall low risk study  . Kidney failure    Dr Cyndia Diver  . Myalgia and myositis, unspecified   . Obesity, unspecified   . Personal history of tobacco use, presenting hazards to health    1/2 ppd  . Plantar fascial fibromatosis   . S/P cholecystectomy   . S/p nephrectomy   . Type II or unspecified type diabetes mellitus without mention of complication, not stated as  uncontrolled   . Unspecified essential hypertension   . Unspecified hereditary and idiopathic peripheral neuropathy    secondary to diabetes    Past Surgical History:  Procedure Laterality Date  . ABDOMINAL HYSTERECTOMY    . APPENDECTOMY    . BACK SURGERY     2006  . CARDIAC CATHETERIZATION     o.k. 2003  . CARPAL TUNNEL RELEASE    . CERVICAL DISCECTOMY    . CHOLECYSTECTOMY    . COLONOSCOPY  09/1999   colonoscopy-hemorrhoids, re-check 5 years (10/2006)  . dexa     osteopenia (01/2004)  . ELBOW SURGERY    . FOOT SURGERY     x 3  . KIDNEY DONATION  1991  . LUMBAR FUSION    . NECK SURGERY     06/2005  . Problem with general Anesthesia     02/2006  . REPLACEMENT TOTAL KNEE  10/2004  . trigger finger surgery    . VENTRAL HERNIA REPAIR N/A 09/08/2017   Primary repair of a 10 x 15 mm infraumbilical fascial defect.  Surgeon: Robert Bellow, MD;  Location: [facility] ORS;  Service: General;  Laterality: N/A;  . VESICOVAGINAL FISTULA CLOSURE W/ TAH    . VIDEO BRONCHOSCOPY Bilateral 04/02/2015   Procedure: VIDEO BRONCHOSCOPY WITHOUT FLUORO;  Surgeon: Juanito Doom, MD;  Location: [facility];  Service: Cardiopulmonary;  Laterality: Bilateral;    Social History   Socioeconomic History  . Marital status: Married    Spouse name: Not on  file  . Number of children: 4  . Years of education: Not on file  . Highest education level: Not on file  Occupational History  . Occupation: Retired  Scientific laboratory technician  . Financial resource strain: Not hard at all  . Food insecurity    Worry: Never true    Inability: Never true  . Transportation needs    Medical: No    Non-medical: No  Tobacco Use  . Smoking status: Current Every Day Smoker    Packs/day: 0.25    Years: 30.00    Pack years: 7.50    Types: Cigarettes  . Smokeless tobacco: Never Used  . Tobacco comment: currently smoking .25 ppd, trying to quit 02/13/16  Substance and Sexual Activity  . Alcohol use: No    Alcohol/week:  0.0 standard drinks  . Drug use: No  . Sexual activity: Never  Lifestyle  . Physical activity    Days per week: 0 days    Minutes per session: Not on file  . Stress: Not on file  Relationships  . Social Herbalist on phone: Not on file    Gets together: Not on file    Attends religious service: Not on file    Active member of club or organization: Not on file    Attends meetings of clubs or organizations: Not on file    Relationship status: Not on file  . Intimate partner violence    Fear of current or ex partner: No    Emotionally abused: No    Physically abused: No    Forced sexual activity: No  Other Topics Concern  . Not on file  Social History Narrative   Lives in [place]. Disability because of back    Family History  Problem Relation Age of Onset  . Hypertension Mother   . Colon cancer Mother   . Cancer Mother        colon  . Coronary artery disease Mother   . Diabetes Mother   . Prostate cancer Father   . COPD Father   . Cancer Father        prostate  . Heart attack Brother 66  . Sleep apnea Brother   . Diabetes Brother   . Diabetes Paternal Aunt   . Diabetes Paternal Uncle   . Cancer Daughter        Brain, lungs, liver, spine, kidney    Allergies  Allergen Reactions  . Morphine Anaphylaxis  . Prednisone Other (See Comments)    "makes me think I am having a heart attack"  . Gabapentin Other (See Comments)    intolerance  . Nsaids Diarrhea  . Penicillins Swelling    Has patient had a PCN reaction causing immediate rash, facial/tongue/throat swelling, SOB or lightheadedness with hypotension: Yes Has patient had a PCN reaction causing severe rash involving mucus membranes or skin necrosis: No Has patient had a PCN reaction that required hospitalization: No Has patient had a PCN reaction occurring within the last 10 years: No If all of the above answers are "NO", then may proceed with Cephalosporin use.      Review of Systems   Review  of Systems: Negative Unless Checked Constitutional: [] Weight loss  [] Fever  [] Chills Cardiac: [] Chest pain   []  Atrial Fibrillation  [] Palpitations   [] Shortness of breath when laying flat   [] Shortness of breath with exertion. [] Shortness of breath at rest Vascular:  [x] Pain in legs with walking   [x] Pain in legs with  standing [] Pain in legs when laying flat   [] Claudication    [] Pain in feet when laying flat    [] History of DVT   [] Phlebitis   [] Swelling in legs   [] Varicose veins   [] Non-healing ulcers Pulmonary:   [] Uses home oxygen   [] Productive cough   [] Hemoptysis   [] Wheeze  [x] COPD   [] Asthma Neurologic:  [] Dizziness   [] Seizures  [] Blackouts [] History of stroke   [] History of TIA  [] Aphasia   [] Temporary Blindness   [] Weakness or numbness in arm   [] Weakness or numbness in leg Musculoskeletal:   [] Joint swelling   [] Joint pain   [] Low back pain  []  History of Knee Replacement [x] Arthritis [] back Surgeries  []  Spinal Stenosis    Hematologic:  [] Easy bruising  [] Easy bleeding   [] Hypercoagulable state   [] Anemic Gastrointestinal:  [] Diarrhea   [] Vomiting  [] Gastroesophageal reflux/heartburn   [] Difficulty swallowing. [] Abdominal pain Genitourinary:  [] Chronic kidney disease   [] Difficult urination  [] Anuric   [] Blood in urine [] Frequent urination  [] Burning with urination   [] Hematuria Skin:  [] Rashes   [] Ulcers [] Wounds Psychological:  [] History of anxiety   [x]  History of major depression  []  Memory Difficulties      OBJECTIVE:   Physical Exam  BP (!) 172/72 (BP Location: Right Arm)   Pulse 80   Resp 16   Wt 198 lb (89.8 kg)   BMI 37.41 kg/m   Gen: WD/WN, NAD Head: [place]/AT, No temporalis wasting.  Ear/Nose/Throat: Hearing grossly intact, nares w/o erythema or drainage Eyes: PER, EOMI, sclera nonicteric.  Neck: Supple, no masses.  No JVD.  Pulmonary:  Good air movement, no use of accessory muscles.  Cardiac: RRR Vascular:  Vessel Right Left  Radial Palpable Palpable   Dorsalis Pedis Trace Palpable Trace Palpable  Posterior Tibial Trace Palpable Trace Palpable   Gastrointestinal: soft, non-distended. No guarding/no peritoneal signs.  Musculoskeletal: M/S 5/5 throughout.  No deformity or atrophy.  Neurologic: Pain and light touch intact in extremities.  Symmetrical.  Speech is fluent. Motor exam as listed above. Psychiatric: Judgment intact, Mood & affect appropriate for pt's clinical situation. Dermatologic: No Venous rashes. No Ulcers Noted.  No changes consistent with cellulitis. Lymph : No Cervical lymphadenopathy, no lichenification or skin changes of chronic lymphedema.       ASSESSMENT AND PLAN:  1. Atherosclerosis of native artery of left lower extremity with intermittent claudication (HCC)  Recommend:  The patient has evidence of atherosclerosis of the lower extremities with claudication.  The patient does not voice lifestyle limiting changes at this point in time.  I discussed angiogram and intervention with the patient however at this time she does not wish to proceed with angiogram.  I also discussed what would be ischemic, threatening symptoms and the patient currently does not describe any.  Patient advised that if she should begin to experience any such as rest pain, discoloration of the lower extremities or continued intense pain she should contact her as soon as possible for a sooner follow-up visit.  Otherwise we will follow up in 6 months.  No invasive studies, angiography or surgery at this time The patient should continue walking and begin a more formal exercise program.  The patient should continue antiplatelet therapy and aggressive treatment of the lipid abnormalities  No changes in the patient's medications at this time  The patient should continue wearing graduated compression socks 10-15 mmHg strength to control the mild edema.   - VAS [place] ABI WITH/WO TBI; Future  2. Tobacco use disorder Smoking cessation was discussed,  3-10 minutes spent on this topic specifically  3. COPD (chronic obstructive pulmonary disease) with chronic bronchitis (HCC) Continue pulmonary medications and aerosols as already ordered, these medications have been reviewed and there are no changes at this time.     Current Outpatient Medications on File Prior to Visit  Medication Sig Dispense Refill  . albuterol (PROVENTIL HFA) 108 (90 Base) MCG/ACT inhaler Inhale 2 puffs every 4 (four) hours as needed into the lungs for wheezing or shortness of breath. 1 Inhaler 2  . albuterol (PROVENTIL) (2.5 MG/3ML) 0.083% nebulizer solution Take 3 mLs (2.5 mg total) by nebulization every 4 (four) hours as needed for wheezing or shortness of breath. 150 mL 1  . amLODipine (NORVASC) 10 MG tablet TAKE 1 TABLET BY MOUTH DAILY 90 tablet 3  . aspirin 81 MG tablet Take 1 tablet (81 mg total) by mouth daily.    Marland Kitchen atorvastatin (LIPITOR) 20 MG tablet TAKE 1 TABLET BY MOUTH DAILY 90 tablet 1  . budesonide (PULMICORT) 0.25 MG/2ML nebulizer solution Take 2 mLs (0.25 mg total) by nebulization 2 (two) times daily. 60 mL 12  . budesonide-formoterol (SYMBICORT) 160-4.5 MCG/ACT inhaler Inhale 2 puffs into the lungs 2 (two) times daily. 1 Inhaler 5  . calcium carbonate (OS-CAL) 600 MG TABS Take 600 mg by mouth daily.      . carvedilol (COREG) 3.125 MG tablet Take 1 tablet (3.125 mg total) by mouth 2 (two) times daily with a meal. 180 tablet 3  . DAILY MULTIPLE VITAMINS PO Take 1 tablet by mouth daily.      . insulin lispro (HUMALOG) 100 UNIT/ML injection Inject into the skin as directed. Sliding scale 100-150 = 2-2.5 units 150-200 = 3-3.5 units 200-250 = 4-4.5 units 251-300 = 5-5.5 units 301-350 = 6-6.5 units 350-400 = 7-7.5 units    . insulin NPH (HUMULIN N,NOVOLIN N) 100 UNIT/ML injection Inject 22 Units into the skin daily before breakfast. 24 units at bedtime    . irbesartan (AVAPRO) 150 MG tablet Take 150 mg by mouth 2 (two) times daily.     . Magnesium 500 MG  CAPS Take by mouth.    . metolazone (ZAROXOLYN) 2.5 MG tablet TAKE 1 TABLET BY MOUTH DAILY 30 MINUTES PRIOR TO TORSEMIDE 90 tablet 1  . nitroGLYCERIN (NITROSTAT) 0.4 MG SL tablet Place 1 tablet (0.4 mg total) under the tongue every 5 (five) minutes as needed. Maximum 3 tablets (Patient taking differently: Place 0.4 mg under the tongue every 5 (five) minutes as needed for chest pain. Maximum 3 tablets) 25 tablet 6  . Spacer/Aero-Holding Chambers (AEROCHAMBER MV) inhaler Use as instructed 1 each 0  . torsemide (DEMADEX) 5 MG tablet TAKE 1 TABLET BY MOUTH DAILY 90 tablet 1  . [DISCONTINUED] valsartan-hydrochlorothiazide (DIOVAN-HCT) 320-25 MG per tablet TAKE ONE (1) TABLET EACH DAY 30 tablet 6   Current Facility-Administered Medications on File Prior to Visit  Medication Dose Route Frequency Provider Last Rate Last Dose  . albuterol (PROVENTIL HFA;VENTOLIN HFA) 108 (90 Base) MCG/ACT inhaler 2 puff  2 puff Inhalation Q4H PRN Byrnett, Forest Gleason, MD      . lactated ringers infusion   Intravenous Continuous Bary Castilla, Forest Gleason, MD      . ondansetron [facility]) 4 mg in sodium chloride 0.9 % 50 mL IVPB  4 mg Intravenous Q4H PRN Robert Bellow, MD      . traMADol Veatrice Bourbon) tablet 50 mg  50 mg Oral Q4H PRN  Robert Bellow, MD        There are no Patient Instructions on file for this visit. No follow-ups on file.   Kris Hartmann, NP  This note was completed with Sales executive.  Any errors are purely unintentional.

## 2019-09-10 ENCOUNTER — Other Ambulatory Visit: Payer: Self-pay

## 2019-09-10 DIAGNOSIS — E1122 Type 2 diabetes mellitus with diabetic chronic kidney disease: Secondary | ICD-10-CM | POA: Diagnosis not present

## 2019-09-10 DIAGNOSIS — N2581 Secondary hyperparathyroidism of renal origin: Secondary | ICD-10-CM | POA: Diagnosis not present

## 2019-09-10 DIAGNOSIS — R768 Other specified abnormal immunological findings in serum: Secondary | ICD-10-CM | POA: Diagnosis not present

## 2019-09-10 DIAGNOSIS — Z524 Kidney donor: Secondary | ICD-10-CM | POA: Diagnosis not present

## 2019-09-10 DIAGNOSIS — I129 Hypertensive chronic kidney disease with stage 1 through stage 4 chronic kidney disease, or unspecified chronic kidney disease: Secondary | ICD-10-CM | POA: Diagnosis not present

## 2019-09-10 DIAGNOSIS — R801 Persistent proteinuria, unspecified: Secondary | ICD-10-CM | POA: Diagnosis not present

## 2019-09-10 DIAGNOSIS — N184 Chronic kidney disease, stage 4 (severe): Secondary | ICD-10-CM | POA: Diagnosis not present

## 2019-09-10 DIAGNOSIS — R319 Hematuria, unspecified: Secondary | ICD-10-CM | POA: Diagnosis not present

## 2019-09-10 DIAGNOSIS — E1165 Type 2 diabetes mellitus with hyperglycemia: Secondary | ICD-10-CM | POA: Diagnosis not present

## 2019-09-10 DIAGNOSIS — D631 Anemia in chronic kidney disease: Secondary | ICD-10-CM | POA: Diagnosis not present

## 2019-09-12 ENCOUNTER — Other Ambulatory Visit: Payer: Self-pay

## 2019-09-12 ENCOUNTER — Ambulatory Visit (INDEPENDENT_AMBULATORY_CARE_PROVIDER_SITE_OTHER): Payer: PPO | Admitting: Internal Medicine

## 2019-09-12 ENCOUNTER — Encounter: Payer: Self-pay | Admitting: Internal Medicine

## 2019-09-12 ENCOUNTER — Ambulatory Visit (INDEPENDENT_AMBULATORY_CARE_PROVIDER_SITE_OTHER): Payer: PPO

## 2019-09-12 DIAGNOSIS — F5104 Psychophysiologic insomnia: Secondary | ICD-10-CM

## 2019-09-12 DIAGNOSIS — Z716 Tobacco abuse counseling: Secondary | ICD-10-CM | POA: Diagnosis not present

## 2019-09-12 DIAGNOSIS — L299 Pruritus, unspecified: Secondary | ICD-10-CM

## 2019-09-12 DIAGNOSIS — Z0000 Encounter for general adult medical examination without abnormal findings: Secondary | ICD-10-CM | POA: Diagnosis not present

## 2019-09-12 MED ORDER — TRAZODONE HCL 50 MG PO TABS: 25.0000 mg | ORAL_TABLET | Freq: Every evening | ORAL | 3 refills | Status: DC | PRN

## 2019-09-12 MED ORDER — TRIAMCINOLONE ACETONIDE 0.1 % EX CREA: 1.0000 "application " | TOPICAL_CREAM | Freq: Two times a day (BID) | CUTANEOUS | 0 refills | Status: DC

## 2019-09-12 MED ORDER — ZOSTER VAC RECOMB ADJUVANTED 50 MCG/0.5ML IM SUSR: 0.5000 mL | Freq: Once | INTRAMUSCULAR | 1 refills | Status: AC

## 2019-09-12 MED ORDER — BUPROPION HCL ER (SR) 100 MG PO TB12: 100.0000 mg | ORAL_TABLET | Freq: Two times a day (BID) | ORAL | 2 refills | Status: DC

## 2019-09-12 NOTE — Progress Notes (Signed)
Subjective:  Patient ID: Andrea Stewart, female    DOB: 03-23-1946  Age: 73 y.o. MRN: [id number]  CC: Diagnoses of Psychophysiological insomnia, Pruritus, and Tobacco abuse counseling were pertinent to this visit.  HPI Andrea Stewart presents for follow up o nmultiple issues.   Chest cancer CT sceen done in July 2020 mammogram done in May Colonoscopy  Was due I 2018  deferred due to hypoglycemic event during prep and procedure clearance to Jan 2021   DEXA 2016    DM type 2 with CKD . Last aqc 8.5  Managed bby Balan   CKD . sAW KOLLARU ON MODAY hydralazine stopped secondary to rash /allergy and now taking  minoxidil 10 mg once daily  Left prosthetic knee itching uncontrollably  TKR  Was done remotely by  [facility]  .  Skin very dry despite  using cerave  . sarna  Cr 2.19 May 2019   COPD ;  Still smoking .  Reduced to 2 daily 2 months ago , then stress got her and she started smoking again now 8 daily  positive screen for depression.  wellbutril trial recommended   Nocturia x 4  For the past several years, now plagued by  insomnia. Has tried benadryl  Makes her too sleepy the next day   Outpatient Medications Prior to Visit  Medication Sig Dispense Refill   albuterol (PROVENTIL HFA) 108 (90 Base) MCG/ACT inhaler Inhale 2 puffs every 4 (four) hours as needed into the lungs for wheezing or shortness of breath. 1 Inhaler 2   albuterol (PROVENTIL) (2.5 MG/3ML) 0.083% nebulizer solution Take 3 mLs (2.5 mg total) by nebulization every 4 (four) hours as needed for wheezing or shortness of breath. 150 mL 1   amLODipine (NORVASC) 10 MG tablet TAKE 1 TABLET BY MOUTH DAILY 90 tablet 3   aspirin 81 MG tablet Take 1 tablet (81 mg total) by mouth daily.     atorvastatin (LIPITOR) 20 MG tablet TAKE 1 TABLET BY MOUTH DAILY 90 tablet 1   budesonide (PULMICORT) 0.25 MG/2ML nebulizer solution Take 2 mLs (0.25 mg total) by nebulization 2 (two) times daily. 60 mL 12   budesonide-formoterol  (SYMBICORT) 160-4.5 MCG/ACT inhaler Inhale 2 puffs into the lungs 2 (two) times daily. 1 Inhaler 5   calcium carbonate (OS-CAL) 600 MG TABS Take 600 mg by mouth daily.       carvedilol (COREG) 3.125 MG tablet Take 1 tablet (3.125 mg total) by mouth 2 (two) times daily with a meal. 180 tablet 3   DAILY MULTIPLE VITAMINS PO Take 1 tablet by mouth daily.       insulin lispro (HUMALOG) 100 UNIT/ML injection Inject into the skin as directed. Sliding scale 100-150 = 2-2.5 units 150-200 = 3-3.5 units 200-250 = 4-4.5 units 251-300 = 5-5.5 units 301-350 = 6-6.5 units 350-400 = 7-7.5 units     insulin NPH (HUMULIN N,NOVOLIN N) 100 UNIT/ML injection Inject 22 Units into the skin daily before breakfast. 24 units at bedtime     irbesartan (AVAPRO) 150 MG tablet Take 150 mg by mouth 2 (two) times daily.      Magnesium 500 MG CAPS Take by mouth.     metolazone (ZAROXOLYN) 2.5 MG tablet TAKE 1 TABLET BY MOUTH DAILY 30 MINUTES PRIOR TO TORSEMIDE 90 tablet 1   nitroGLYCERIN (NITROSTAT) 0.4 MG SL tablet Place 1 tablet (0.4 mg total) under the tongue every 5 (five) minutes as needed. Maximum 3 tablets (Patient taking differently: Place 0.4 mg  under the tongue every 5 (five) minutes as needed for chest pain. Maximum 3 tablets) 25 tablet 6   Spacer/Aero-Holding Chambers (AEROCHAMBER MV) inhaler Use as instructed 1 each 0   torsemide (DEMADEX) 5 MG tablet TAKE 1 TABLET BY MOUTH DAILY 90 tablet 1   minoxidil (LONITEN) 10 MG tablet      Facility-Administered Medications Prior to Visit  Medication Dose Route Frequency Provider Last Rate Last Dose   albuterol (PROVENTIL HFA;VENTOLIN HFA) 108 (90 Base) MCG/ACT inhaler 2 puff  2 puff Inhalation Q4H PRN Byrnett, Forest Gleason, MD       lactated ringers infusion   Intravenous Continuous Byrnett, Forest Gleason, MD       ondansetron [facility]) 4 mg in sodium chloride 0.9 % 50 mL IVPB  4 mg Intravenous Q4H PRN Robert Bellow, MD       traMADol Veatrice Bourbon) tablet 50 mg   50 mg Oral Q4H PRN Robert Bellow, MD        Review of Systems;  Patient denies headache, fevers, malaise, unintentional weight loss, skin rash, eye pain, sinus congestion and sinus pain, sore throat, dysphagia,  hemoptysis , cough, dyspnea, wheezing, chest pain, palpitations, orthopnea, edema, abdominal pain, nausea, melena, diarrhea, constipation, flank pain, dysuria, hematuria, urinary  Frequency, nocturia, numbness, tingling, seizures,  Focal weakness, Loss of consciousness,  Tremor, insomnia, depression, anxiety, and suicidal ideation.      Objective:  BP (!) 156/70 (BP Location: Left Arm, Patient Position: Sitting, Cuff Size: Large)    Pulse 76    Temp (!) 97 F (36.1 C) (Temporal)    Resp 16    Ht 5\' 1"  (1.549 m)    Wt 197 lb 6.4 oz (89.5 kg)    SpO2 94%    BMI 37.30 kg/m   BP Readings from Last 3 Encounters:  09/12/19 (!) 156/70  09/05/19 (!) 172/72  02/27/19 (!) 165/76    Wt Readings from Last 3 Encounters:  09/12/19 197 lb 6.4 oz (89.5 kg)  09/05/19 198 lb (89.8 kg)  05/25/19 200 lb (90.7 kg)    General appearance: alert, cooperative and appears stated age Ears: normal TM's and external ear canals both ears Throat: lips, mucosa, and tongue normal; teeth and gums normal Neck: no adenopathy, no carotid bruit, supple, symmetrical, trachea midline and thyroid not enlarged, symmetric, no tenderness/mass/nodules Back: symmetric, no curvature. ROM normal. No CVA tenderness. Lungs: clear to auscultation bilaterally Heart: regular rate and rhythm, S1, S2 normal, no murmur, click, rub or gallop Abdomen: soft, non-tender; bowel sounds normal; no masses,  no organomegaly Pulses: 2+ and symmetric Skin: Skin color, texture, turgor normal. No rashes or lesions Lymph nodes: Cervical, supraclavicular, and axillary nodes normal.  Lab Results  Component Value Date   HGBA1C 8.5 07/09/2019   HGBA1C 9.4 (H) 09/07/2017   HGBA1C 9.7 07/25/2017    Lab Results  Component Value Date    CREATININE 2.1 (A) 07/31/2018   CREATININE 2.1 (A) 04/04/2018   CREATININE 2.0 (A) 09/21/2017    Lab Results  Component Value Date   WBC 6.8 04/04/2018   HGB 12.1 07/31/2018   HCT 36 07/31/2018   PLT 209 07/31/2018   GLUCOSE 141 (H) 09/08/2017   CHOL 136 08/25/2017   TRIG 111.0 08/25/2017   HDL 60.30 08/25/2017   LDLDIRECT 51.9 09/07/2012   LDLCALC 53 08/25/2017   ALT 16 07/22/2017   AST 18 07/22/2017   NA 139 07/31/2018   K 4.3 07/31/2018   CL 105 09/08/2017  CREATININE 2.1 (A) 07/31/2018   BUN 37 (A) 07/31/2018   CO2 28 09/08/2017   TSH 3.04 08/25/2017   INR 1.01 03/27/2015   HGBA1C 8.5 07/09/2019   MICROALBUR protein/creatinine [id number] 04/04/2018    Ct Chest Lung Cancer Screening Low Dose Wo Contrast  Result Date: 05/25/2019 CLINICAL DATA:  73 year old female current smoker, with 32 pack-year history of smoking, for follow-up lung cancer screening EXAM: CT CHEST WITHOUT CONTRAST LOW-DOSE FOR LUNG CANCER SCREENING TECHNIQUE: Multidetector CT imaging of the chest was performed following the standard protocol without IV contrast. COMPARISON:  Low-dose lung cancer screening CT chest dated 04/25/2018 FINDINGS: Cardiovascular: The heart is normal in size. No pericardial effusion. No evidence thoracic aortic aneurysm. Mild atherosclerotic calcifications of the aortic arch. Three vessel coronary atherosclerosis, LAD predominant. Mediastinum/Nodes: No suspicious mediastinal lymphadenopathy. 1.9 x 2.9 cm lesion in the posterior mediastinum abutting the esophagus and posterior to the trachea (series 2/image 12), with benign calcifications posteriorly/inferiorly (series 2/image 13), and possibly arising from the posterior aspect of the right thyroid lobe (series 2/image 9). Regardless, this appearance is chronic dating back to at least 2016 and likely benign. Lungs/Pleura: Platelike scarring in the lingula and medial right middle lobe. No focal consolidation. Prior bilateral pulmonary  nodules are not conspicuous on the current study, possibly related to motion degradation in the right upper lobe and superimposed atelectasis in the lingula. Regardless, there are no suspicious pulmonary nodules on the current study. Mild centrilobular emphysematous changes, upper lung predominant. No pleural effusion or pneumothorax. Upper Abdomen: Visualized upper abdomen is notable for suspected prior left nephrectomy and vascular calcifications. Musculoskeletal: Degenerative changes of the visualized thoracolumbar spine. Cervical spine fixation hardware, incompletely visualized. IMPRESSION: Lung-RADS 1, negative. Continue annual screening with low-dose chest CT without contrast in 12 months. Aortic Atherosclerosis (ICD10-I70.0) and Emphysema (ICD10-J43.9). Electronically Signed   By: Julian Hy M.D.   On: 05/25/2019 20:39    Assessment & Plan:   Problem List Items Addressed This Visit      Unprioritized   Tobacco abuse counseling    She has resumed smoking due to the stress of COVID 19 CAD.   Marland Kitchen  5 minutes was spent in face to face time discussing patient's current tobacco abuse,  Prior attempts to quit, the current and future hazards to patient's  health,  And assessing and discussing patient's  level of motivation to quit. Patient was encouraged to consider pharmacotherapy with wellbutrin.      Insomnia    Trial of trazodone recommended       Pruritus    Of knee. Skin is lichenified. triamcinolone prescribed          I am having Andrea Stewart start on triamcinolone cream, buPROPion, traZODone, and Zoster Vaccine Adjuvanted. I am also having her maintain her calcium carbonate, DAILY MULTIPLE VITAMINS PO, insulin lispro, insulin NPH Human, aspirin, nitroGLYCERIN, AeroChamber MV, irbesartan, albuterol, albuterol, budesonide, torsemide, Magnesium, carvedilol, metolazone, budesonide-formoterol, amLODipine, atorvastatin, and minoxidil. We will continue to administer lactated  ringers, ondansetron (ZOFRAN) 4 mg in sodium chloride 0.9 % 50 mL IVPB, albuterol, and traMADol.  Meds ordered this encounter  Medications   triamcinolone cream (KENALOG) 0.1 %    Sig: Apply 1 application topically 2 (two) times daily. TO LEFT KNEE    Dispense:  30 g    Refill:  0   buPROPion (WELLBUTRIN SR) 100 MG 12 hr tablet    Sig: Take 1 tablet (100 mg total) by mouth 2 (two) times daily.  Dispense:  60 tablet    Refill:  2   traZODone (DESYREL) 50 MG tablet    Sig: Take 0.5-1 tablets (25-50 mg total) by mouth at bedtime as needed for sleep.    Dispense:  30 tablet    Refill:  3   Zoster Vaccine Adjuvanted [facility]) injection    Sig: Inject 0.5 mLs into the muscle once for 1 dose.    Dispense:  1 each    Refill:  1   A total of 25 minutes of face to face time was spent with patient more than half of which was spent in counselling about the above mentioned conditions  and coordination of care  There are no discontinued medications.  Follow-up: Return in about 6 months (around 03/12/2020).   Crecencio Mc, MD

## 2019-09-12 NOTE — Patient Instructions (Addendum)
  Ms. Streat , Thank you for taking time to come for your Medicare Wellness Visit. I appreciate your ongoing commitment to your health goals. Please review the following plan we discussed and let me know if I can assist you in the future.   These are the goals we discussed: Goals      Patient Stated   . Quit Smoking (pt-stated)     Smoke no more than 4 cigarettes per day, continue to decrease until reaching 0.       This is a list of the screening recommended for you and due dates:  Health Maintenance  Topic Date Due  .  Hepatitis C: One time screening is recommended by Center for Disease Control  (CDC) for  adults born from [birth year] through 1966.   06-19-1946  . Colon Cancer Screening  09/11/2020*  . Eye exam for diabetics  10/15/2019  . Hemoglobin A1C  01/09/2020  . Mammogram  04/10/2020  . Complete foot exam   08/29/2020  . Tetanus Vaccine  11/05/2024  . Flu Shot  Completed  . DEXA scan (bone density measurement)  Completed  . Pneumonia vaccines  Completed  *Topic was postponed. The date shown is not the original due date.

## 2019-09-12 NOTE — Progress Notes (Addendum)
Subjective:   NURY GROSKOPF is a 73 y.o. female who presents for Medicare Annual (Subsequent) preventive examination.  Review of Systems:  No ROS.  Medicare Wellness Virtual Visit.  Visual/audio telehealth visit, UTA vital signs.   See social history for additional risk factors.   Cardiac Risk Factors include: advanced age (>5men, >53 women);hypertension;diabetes mellitus     Objective:     Vitals: There were no vitals taken for this visit.  There is no height or weight on file to calculate BMI.  Advanced Directives 09/12/2019 09/07/2018 09/22/2017 09/08/2017 09/07/2017 08/04/2017 06/21/2017  Does Patient Have a Medical Advance Directive? Yes Yes Yes Yes Yes Yes Yes  Type of Paramedic of [place];Living will [place];Living will [place];Out of facility DNR (pink MOST or yellow form) [place];Living will [place];Living will [place];Living will [place];Living will  Does patient want to make changes to medical advance directive? No - Patient declined No - Patient declined No - Patient declined No - Patient declined No - Patient declined No - Patient declined -  Copy of [place] in Chart? No - copy requested No - copy requested No - copy requested Yes Yes No - copy requested -    Tobacco Social History   Tobacco Use  Smoking Status Current Every Day Smoker  . Packs/day: 0.25  . Years: 30.00  . Pack years: 7.50  . Types: Cigarettes  Smokeless Tobacco Never Used  Tobacco Comment   currently smoking .25 ppd, trying to quit 02/13/16     Ready to quit: Not Answered Counseling given: Not Answered Comment: currently smoking .25 ppd, trying to quit 02/13/16   Clinical Intake:  Pre-visit preparation completed: Yes        Diabetes: Yes(Followed by [facility], Dr. Soyla Murphy)  How often do you need to  have someone help you when you read instructions, pamphlets, or other written materials from your doctor or pharmacy?: 1 - Never  Interpreter Needed?: No     Past Medical History:  Diagnosis Date  . Backache, unspecified    s/p c-spine and l-spine fusions  . Degeneration of intervertebral disc, site unspecified   . Depressive disorder, not elsewhere classified   . Disorder of bone and cartilage, unspecified   . HTN (hypertension)   . Hx of left heart catheterization by cutdown    a. 7 years ago; no significant cad; b. Lexiscan 2014: no significant ischemia, no significant EKG changes concerning for ischemia, EF 67%, overall low risk study  . Kidney failure    Dr Cyndia Diver  . Myalgia and myositis, unspecified   . Obesity, unspecified   . Personal history of tobacco use, presenting hazards to health    1/2 ppd  . Plantar fascial fibromatosis   . S/P cholecystectomy   . S/p nephrectomy   . Type II or unspecified type diabetes mellitus without mention of complication, not stated as uncontrolled   . Unspecified essential hypertension   . Unspecified hereditary and idiopathic peripheral neuropathy    secondary to diabetes   Past Surgical History:  Procedure Laterality Date  . ABDOMINAL HYSTERECTOMY    . APPENDECTOMY    . BACK SURGERY     2006  . CARDIAC CATHETERIZATION     o.k. 2003  . CARPAL TUNNEL RELEASE    . CERVICAL DISCECTOMY    . CHOLECYSTECTOMY    . COLONOSCOPY  09/1999  colonoscopy-hemorrhoids, re-check 5 years (10/2006)  . dexa     osteopenia (01/2004)  . ELBOW SURGERY    . FOOT SURGERY     x 3  . KIDNEY DONATION  1991  . LUMBAR FUSION    . NECK SURGERY     06/2005  . Problem with general Anesthesia     02/2006  . REPLACEMENT TOTAL KNEE  10/2004  . trigger finger surgery    . VENTRAL HERNIA REPAIR N/A 09/08/2017   Primary repair of a 10 x 15 mm infraumbilical fascial defect.  Surgeon: Robert Bellow, MD;  Location: [facility] ORS;  Service: General;   Laterality: N/A;  . VESICOVAGINAL FISTULA CLOSURE W/ TAH    . VIDEO BRONCHOSCOPY Bilateral 04/02/2015   Procedure: VIDEO BRONCHOSCOPY WITHOUT FLUORO;  Surgeon: Juanito Doom, MD;  Location: [facility] ENDOSCOPY;  Service: Cardiopulmonary;  Laterality: Bilateral;   Family History  Problem Relation Age of Onset  . Hypertension Mother   . Colon cancer Mother   . Cancer Mother        colon  . Coronary artery disease Mother   . Diabetes Mother   . Prostate cancer Father   . COPD Father   . Cancer Father        prostate  . Heart attack Brother 72  . Sleep apnea Brother   . Diabetes Brother   . Diabetes Paternal Aunt   . Diabetes Paternal Uncle   . Cancer Daughter        Brain, lungs, liver, spine, kidney   Social History   Socioeconomic History  . Marital status: Married    Spouse name: Not on file  . Number of children: 4  . Years of education: Not on file  . Highest education level: Not on file  Occupational History  . Occupation: Retired  Scientific laboratory technician  . Financial resource strain: Not hard at all  . Food insecurity    Worry: Never true    Inability: Never true  . Transportation needs    Medical: No    Non-medical: No  Tobacco Use  . Smoking status: Current Every Day Smoker    Packs/day: 0.25    Years: 30.00    Pack years: 7.50    Types: Cigarettes  . Smokeless tobacco: Never Used  . Tobacco comment: currently smoking .25 ppd, trying to quit 02/13/16  Substance and Sexual Activity  . Alcohol use: No    Alcohol/week: 0.0 standard drinks  . Drug use: No  . Sexual activity: Never  Lifestyle  . Physical activity    Days per week: 0 days    Minutes per session: Not on file  . Stress: Not on file  Relationships  . Social Herbalist on phone: Not on file    Gets together: Not on file    Attends religious service: Not on file    Active member of club or organization: Not on file    Attends meetings of clubs or organizations: Not on file    Relationship  status: Not on file  Other Topics Concern  . Not on file  Social History Narrative   Lives in [place]. Disability because of back    Outpatient Encounter Medications as of 09/12/2019  Medication Sig  . albuterol (PROVENTIL HFA) 108 (90 Base) MCG/ACT inhaler Inhale 2 puffs every 4 (four) hours as needed into the lungs for wheezing or shortness of breath.  Marland Kitchen albuterol (PROVENTIL) (2.5 MG/3ML) 0.083% nebulizer solution Take 3  mLs (2.5 mg total) by nebulization every 4 (four) hours as needed for wheezing or shortness of breath.  Marland Kitchen amLODipine (NORVASC) 10 MG tablet TAKE 1 TABLET BY MOUTH DAILY  . aspirin 81 MG tablet Take 1 tablet (81 mg total) by mouth daily.  Marland Kitchen atorvastatin (LIPITOR) 20 MG tablet TAKE 1 TABLET BY MOUTH DAILY  . budesonide (PULMICORT) 0.25 MG/2ML nebulizer solution Take 2 mLs (0.25 mg total) by nebulization 2 (two) times daily.  . budesonide-formoterol (SYMBICORT) 160-4.5 MCG/ACT inhaler Inhale 2 puffs into the lungs 2 (two) times daily.  . calcium carbonate (OS-CAL) 600 MG TABS Take 600 mg by mouth daily.    . carvedilol (COREG) 3.125 MG tablet Take 1 tablet (3.125 mg total) by mouth 2 (two) times daily with a meal.  . DAILY MULTIPLE VITAMINS PO Take 1 tablet by mouth daily.    . insulin lispro (HUMALOG) 100 UNIT/ML injection Inject into the skin as directed. Sliding scale 100-150 = 2-2.5 units 150-200 = 3-3.5 units 200-250 = 4-4.5 units 251-300 = 5-5.5 units 301-350 = 6-6.5 units 350-400 = 7-7.5 units  . insulin NPH (HUMULIN N,NOVOLIN N) 100 UNIT/ML injection Inject 22 Units into the skin daily before breakfast. 24 units at bedtime  . irbesartan (AVAPRO) 150 MG tablet Take 150 mg by mouth 2 (two) times daily.   . Magnesium 500 MG CAPS Take by mouth.  . metolazone (ZAROXOLYN) 2.5 MG tablet TAKE 1 TABLET BY MOUTH DAILY 30 MINUTES PRIOR TO TORSEMIDE  . nitroGLYCERIN (NITROSTAT) 0.4 MG SL tablet Place 1 tablet (0.4 mg total) under the tongue every 5 (five) minutes as needed.  Maximum 3 tablets (Patient taking differently: Place 0.4 mg under the tongue every 5 (five) minutes as needed for chest pain. Maximum 3 tablets)  . Spacer/Aero-Holding Chambers (AEROCHAMBER MV) inhaler Use as instructed  . torsemide (DEMADEX) 5 MG tablet TAKE 1 TABLET BY MOUTH DAILY  . [DISCONTINUED] valsartan-hydrochlorothiazide (DIOVAN-HCT) 320-25 MG per tablet TAKE ONE (1) TABLET EACH DAY   Facility-Administered Encounter Medications as of 09/12/2019  Medication  . albuterol (PROVENTIL HFA;VENTOLIN HFA) 108 (90 Base) MCG/ACT inhaler 2 puff  . lactated ringers infusion  . ondansetron (ZOFRAN) 4 mg in sodium chloride 0.9 % 50 mL IVPB  . traMADol (ULTRAM) tablet 50 mg    Activities of Daily Living In your present state of health, do you have any difficulty performing the following activities: 09/12/2019  Hearing? N  Vision? N  Difficulty concentrating or making decisions? N  Walking or climbing stairs? N  Dressing or bathing? N  Doing errands, shopping? N  Preparing Food and eating ? N  Using the Toilet? N  In the past six months, have you accidently leaked urine? N  Do you have problems with loss of bowel control? N  Managing your Medications? N  Managing your Finances? N  Housekeeping or managing your Housekeeping? N  Some recent data might be hidden    Patient Care Team: Crecencio Mc, MD as PCP - General (Internal Medicine) Jacelyn Pi, MD as Consulting Physician (Endocrinology) Minna Merritts, MD as Consulting Physician (Cardiology) Carmelia Roller, MD as Referring Physician (Internal Medicine) Bary Castilla [place] Gleason, MD (General Surgery)    Assessment:   This is a routine wellness examination for Leya.  Nurse connected with patient 09/12/19 at 11:30 AM EDT by a telephone enabled telemedicine application and verified that I am speaking with the correct person using two identifiers. Patient stated full name and DOB. Patient gave permission to continue  with virtual  visit. Patient's location was at home and Nurse's location was at [place] office.   Health Maintenance Due: -Colonoscopy- she cancelled the appointment 6 months ago; declined future appointments.  -Eye Exam- scheduled 09/2019 -Foot Exam- followed by Dr. Soyla Murphy; last exam 2 weeks ago -Hgb A1c- 07/09/19 (8.5) Update all pending maintenance due as appropriate.   See completed HM at the end of note.   Eye: Visual acuity not assessed. Virtual visit. Wears corrective lenses. Followed by their ophthalmologist. Retinopathy- none reported  Dental: UTD   Hearing: Demonstrates normal hearing during visit.  Safety:  Patient feels safe at home- yes Patient does have smoke detectors at home- yes Patient does wear sunscreen or protective clothing when in direct sunlight - yes Patient does wear seat belt when in a moving vehicle - yes Patient drives- limited Adequate lighting in walkways free from debris- yes Grab bars and handrails used as appropriate- yes Ambulates with a cane as an assistive device Cell phone on person when ambulating outside of the home- yes  Social: Alcohol intake - no       Smoking history- current Illicit drug use? none  Depression: PHQ 2 &9 complete. See screening below. Denies irritability, anhedonia, sadness/tearfullness.    Falls: See screening below.    Medication: Taking as directed and without issues.   Covid-19: Precautions and sickness symptoms discussed. Wears mask, social distancing, hand hygiene as appropriate.   Activities of Daily Living Patient denies needing assistance with: household chores, feeding themselves, getting from bed to chair, getting to the toilet, bathing/showering, dressing, managing money, or preparing meals.  Assisted by husband as needed.   Memory: Patient is alert. Patient denies difficulty focusing or concentrating. Correctly identified the president of the [place], season and recall.  Patient likes to read, play computer  games, complete puzzles for brain stimulation.   BMI- discussed the importance of a healthy diet, water intake and the benefits of aerobic exercise.  Educational material provided.  Physical activity- no routine  Diet:  Regular Water: fair intake  Other Providers Patient Care Team: Crecencio Mc, MD as PCP - General (Internal Medicine) Jacelyn Pi, MD as Consulting Physician (Endocrinology) Minna Merritts, MD as Consulting Physician (Cardiology) Carmelia Roller, MD as Referring Physician (Internal Medicine) Bary Castilla [place] Gleason, MD (General Surgery)  Exercise Activities and Dietary recommendations Current Exercise Habits: The patient does not participate in regular exercise at present  Goals      Patient Stated   . Quit Smoking (pt-stated)     Smoke no more than 4 cigarettes per day, continue to decrease until reaching 0.       Fall Risk Fall Risk  09/12/2019 09/07/2018 08/04/2017 09/16/2016 07/16/2016  Falls in the past year? 0 No No No No  Comment - - - - Emmi Telephone Survey: data to providers prior to load   Timed Get Up and Go performed: no, virtual visit  Depression Screen PHQ 2/9 Scores 09/12/2019 09/07/2018 08/04/2017 09/16/2016  PHQ - 2 Score 0 0 0 0  PHQ- 9 Score - - 0 -     Cognitive Function     6CIT Screen 09/12/2019 09/07/2018 08/04/2017  What Year? 0 points 0 points 0 points  What month? 0 points 0 points 0 points  What time? 0 points 0 points 0 points  Count back from 20 0 points 0 points 0 points  Months in reverse 0 points 0 points 0 points  Repeat phrase 0 points 0 points 0 points  Total Score 0 0 0    Immunization History  Administered Date(s) Administered  . Fluad Quad(high Dose 65+) 08/10/2019  . Influenza Split 09/01/2011, 09/13/2016  . Influenza Whole 11/02/2005  . Influenza, High Dose Seasonal PF 08/25/2017, 08/23/2018  . Influenza,inj,Quad PF,6+ Mos 07/26/2013, 09/24/2014, 08/11/2015  . Influenza-Unspecified 09/07/2012  .  Pneumococcal Conjugate-13 03/01/2014  . Pneumococcal Polysaccharide-23 03/01/2006, 08/04/2017  . Td 11/15/2000  . Tdap 11/05/2014  . Zoster 03/01/2012   Screening Tests Health Maintenance  Topic Date Due  . Hepatitis C Screening  04/30/1946  . COLONOSCOPY  09/11/2020 (Originally 04/18/2017)  . OPHTHALMOLOGY EXAM  10/15/2019  . HEMOGLOBIN A1C  01/09/2020  . MAMMOGRAM  04/10/2020  . FOOT EXAM  08/29/2020  . TETANUS/TDAP  11/05/2024  . INFLUENZA VACCINE  Completed  . DEXA SCAN  Completed  . PNA vac Low Risk Adult  Completed      Plan:   Keep all routine maintenance appointments.   Follow up with your doctor today @ 130  Medicare Attestation I have personally reviewed: The patient's medical and social history Their use of alcohol, tobacco or illicit drugs Their current medications and supplements The patient's functional ability including ADLs,fall risks, home safety risks, cognitive, and hearing and visual impairment Diet and physical activities Evidence for depression   In addition, I have reviewed and discussed with patient certain preventive protocols, quality metrics, and best practice recommendations. A written personalized care plan for preventive services as well as general preventive health recommendations were provided to patient via mail.     OBrien-Blaney, Taaj Hurlbut L, LPN  [id number]     I have reviewed the above information and agree with above.   Deborra Medina, MD

## 2019-09-12 NOTE — Patient Instructions (Addendum)
I RECOMMEND ASKING FOR CARMEN (LAURA) GONZALEZ TO REPLACE DR [place] AS YOUR PULMONOLOGIST.[place]ana Salvage IS IN [place]   Triamcinolone ointment for your knee. Use twice daily until the itching resolves.  If it is not strong enough,  Let me know   Wellbutrin 100 mg twice daily for depression and tobacco dependence   Trazodone at bedtime for insomnia.  Start with 1/2 tablet

## 2019-09-13 DIAGNOSIS — G4700 Insomnia, unspecified: Secondary | ICD-10-CM | POA: Insufficient documentation

## 2019-09-13 DIAGNOSIS — L299 Pruritus, unspecified: Secondary | ICD-10-CM | POA: Insufficient documentation

## 2019-09-13 NOTE — Assessment & Plan Note (Addendum)
Of knee. Skin is lichenified. triamcinolone prescribed

## 2019-09-13 NOTE — Assessment & Plan Note (Signed)
Trial of trazodone recommended.  °

## 2019-09-13 NOTE — Assessment & Plan Note (Signed)
She has resumed smoking due to the stress of COVID 19 CAD.   Marland Kitchen  5 minutes was spent in face to face time discussing patient's current tobacco abuse,  Prior attempts to quit, the current and future hazards to patient's  health,  And assessing and discussing patient's  level of motivation to quit. Patient was encouraged to consider pharmacotherapy with wellbutrin.

## 2019-10-01 DIAGNOSIS — E113293 Type 2 diabetes mellitus with mild nonproliferative diabetic retinopathy without macular edema, bilateral: Secondary | ICD-10-CM | POA: Diagnosis not present

## 2019-10-03 DIAGNOSIS — N2581 Secondary hyperparathyroidism of renal origin: Secondary | ICD-10-CM | POA: Diagnosis not present

## 2019-10-03 DIAGNOSIS — I129 Hypertensive chronic kidney disease with stage 1 through stage 4 chronic kidney disease, or unspecified chronic kidney disease: Secondary | ICD-10-CM | POA: Diagnosis not present

## 2019-10-03 DIAGNOSIS — N184 Chronic kidney disease, stage 4 (severe): Secondary | ICD-10-CM | POA: Diagnosis not present

## 2019-10-03 DIAGNOSIS — R319 Hematuria, unspecified: Secondary | ICD-10-CM | POA: Diagnosis not present

## 2019-10-03 DIAGNOSIS — Z524 Kidney donor: Secondary | ICD-10-CM | POA: Diagnosis not present

## 2019-10-03 DIAGNOSIS — R801 Persistent proteinuria, unspecified: Secondary | ICD-10-CM | POA: Diagnosis not present

## 2019-10-03 DIAGNOSIS — R768 Other specified abnormal immunological findings in serum: Secondary | ICD-10-CM | POA: Diagnosis not present

## 2019-10-03 DIAGNOSIS — D631 Anemia in chronic kidney disease: Secondary | ICD-10-CM | POA: Diagnosis not present

## 2019-10-03 DIAGNOSIS — E1122 Type 2 diabetes mellitus with diabetic chronic kidney disease: Secondary | ICD-10-CM | POA: Diagnosis not present

## 2019-10-12 DIAGNOSIS — E1165 Type 2 diabetes mellitus with hyperglycemia: Secondary | ICD-10-CM | POA: Diagnosis not present

## 2019-10-15 DIAGNOSIS — Z794 Long term (current) use of insulin: Secondary | ICD-10-CM | POA: Diagnosis not present

## 2019-10-15 DIAGNOSIS — I5033 Acute on chronic diastolic (congestive) heart failure: Secondary | ICD-10-CM | POA: Diagnosis not present

## 2019-10-15 DIAGNOSIS — N184 Chronic kidney disease, stage 4 (severe): Secondary | ICD-10-CM | POA: Diagnosis not present

## 2019-10-15 DIAGNOSIS — E118 Type 2 diabetes mellitus with unspecified complications: Secondary | ICD-10-CM | POA: Diagnosis not present

## 2019-10-15 DIAGNOSIS — I739 Peripheral vascular disease, unspecified: Secondary | ICD-10-CM | POA: Diagnosis not present

## 2019-10-15 DIAGNOSIS — J431 Panlobular emphysema: Secondary | ICD-10-CM | POA: Diagnosis not present

## 2019-10-22 DIAGNOSIS — E538 Deficiency of other specified B group vitamins: Secondary | ICD-10-CM | POA: Diagnosis not present

## 2019-10-22 DIAGNOSIS — N184 Chronic kidney disease, stage 4 (severe): Secondary | ICD-10-CM | POA: Diagnosis not present

## 2019-10-22 DIAGNOSIS — I5033 Acute on chronic diastolic (congestive) heart failure: Secondary | ICD-10-CM | POA: Diagnosis not present

## 2019-11-05 DIAGNOSIS — I5033 Acute on chronic diastolic (congestive) heart failure: Secondary | ICD-10-CM | POA: Diagnosis not present

## 2019-11-05 DIAGNOSIS — N184 Chronic kidney disease, stage 4 (severe): Secondary | ICD-10-CM | POA: Diagnosis not present

## 2019-11-12 DIAGNOSIS — E1165 Type 2 diabetes mellitus with hyperglycemia: Secondary | ICD-10-CM | POA: Diagnosis not present

## 2019-11-13 DIAGNOSIS — R768 Other specified abnormal immunological findings in serum: Secondary | ICD-10-CM | POA: Diagnosis not present

## 2019-11-13 DIAGNOSIS — R319 Hematuria, unspecified: Secondary | ICD-10-CM | POA: Diagnosis not present

## 2019-11-13 DIAGNOSIS — N184 Chronic kidney disease, stage 4 (severe): Secondary | ICD-10-CM | POA: Diagnosis not present

## 2019-11-13 DIAGNOSIS — N2581 Secondary hyperparathyroidism of renal origin: Secondary | ICD-10-CM | POA: Diagnosis not present

## 2019-11-13 DIAGNOSIS — Z524 Kidney donor: Secondary | ICD-10-CM | POA: Diagnosis not present

## 2019-11-13 DIAGNOSIS — I129 Hypertensive chronic kidney disease with stage 1 through stage 4 chronic kidney disease, or unspecified chronic kidney disease: Secondary | ICD-10-CM | POA: Diagnosis not present

## 2019-11-13 DIAGNOSIS — D631 Anemia in chronic kidney disease: Secondary | ICD-10-CM | POA: Diagnosis not present

## 2019-11-13 DIAGNOSIS — E1122 Type 2 diabetes mellitus with diabetic chronic kidney disease: Secondary | ICD-10-CM | POA: Diagnosis not present

## 2019-11-13 DIAGNOSIS — R801 Persistent proteinuria, unspecified: Secondary | ICD-10-CM | POA: Diagnosis not present

## 2019-11-26 DIAGNOSIS — Z0000 Encounter for general adult medical examination without abnormal findings: Secondary | ICD-10-CM | POA: Diagnosis not present

## 2019-11-26 DIAGNOSIS — Z794 Long term (current) use of insulin: Secondary | ICD-10-CM | POA: Diagnosis not present

## 2019-11-26 DIAGNOSIS — I739 Peripheral vascular disease, unspecified: Secondary | ICD-10-CM | POA: Diagnosis not present

## 2019-11-26 DIAGNOSIS — I5033 Acute on chronic diastolic (congestive) heart failure: Secondary | ICD-10-CM | POA: Diagnosis not present

## 2019-11-26 DIAGNOSIS — E118 Type 2 diabetes mellitus with unspecified complications: Secondary | ICD-10-CM | POA: Diagnosis not present

## 2019-11-26 DIAGNOSIS — N184 Chronic kidney disease, stage 4 (severe): Secondary | ICD-10-CM | POA: Diagnosis not present

## 2019-11-26 DIAGNOSIS — J431 Panlobular emphysema: Secondary | ICD-10-CM | POA: Diagnosis not present

## 2019-12-04 DIAGNOSIS — E1122 Type 2 diabetes mellitus with diabetic chronic kidney disease: Secondary | ICD-10-CM | POA: Diagnosis not present

## 2019-12-04 DIAGNOSIS — N2581 Secondary hyperparathyroidism of renal origin: Secondary | ICD-10-CM | POA: Diagnosis not present

## 2019-12-04 DIAGNOSIS — N184 Chronic kidney disease, stage 4 (severe): Secondary | ICD-10-CM | POA: Diagnosis not present

## 2019-12-04 DIAGNOSIS — Z524 Kidney donor: Secondary | ICD-10-CM | POA: Diagnosis not present

## 2019-12-04 DIAGNOSIS — I129 Hypertensive chronic kidney disease with stage 1 through stage 4 chronic kidney disease, or unspecified chronic kidney disease: Secondary | ICD-10-CM | POA: Diagnosis not present

## 2019-12-04 DIAGNOSIS — R801 Persistent proteinuria, unspecified: Secondary | ICD-10-CM | POA: Diagnosis not present

## 2019-12-04 DIAGNOSIS — R768 Other specified abnormal immunological findings in serum: Secondary | ICD-10-CM | POA: Diagnosis not present

## 2019-12-04 DIAGNOSIS — R319 Hematuria, unspecified: Secondary | ICD-10-CM | POA: Diagnosis not present

## 2019-12-04 DIAGNOSIS — D631 Anemia in chronic kidney disease: Secondary | ICD-10-CM | POA: Diagnosis not present

## 2019-12-04 LAB — BASIC METABOLIC PANEL
BUN: 37 — AB (ref 4–21)
CO2: 28 — AB (ref 13–22)
Creatinine: 2.9 — AB (ref 0.5–1.1)
Potassium: 4.5 (ref 3.4–5.3)
Sodium: 139 (ref 137–147)

## 2019-12-04 LAB — COMPREHENSIVE METABOLIC PANEL
Albumin: 3.2 — AB (ref 3.5–5.0)
Calcium: 8.8 (ref 8.7–10.7)
GFR calc Af Amer: 18
GFR calc non Af Amer: 15

## 2019-12-04 LAB — CBC AND DIFFERENTIAL
HCT: 37 (ref 36–46)
Hemoglobin: 11 — AB (ref 12.0–16.0)
Platelets: 174 (ref 150–399)
WBC: 6.4

## 2019-12-10 DIAGNOSIS — N184 Chronic kidney disease, stage 4 (severe): Secondary | ICD-10-CM | POA: Diagnosis not present

## 2019-12-10 DIAGNOSIS — R319 Hematuria, unspecified: Secondary | ICD-10-CM | POA: Diagnosis not present

## 2019-12-10 DIAGNOSIS — N2581 Secondary hyperparathyroidism of renal origin: Secondary | ICD-10-CM | POA: Diagnosis not present

## 2019-12-10 DIAGNOSIS — D631 Anemia in chronic kidney disease: Secondary | ICD-10-CM | POA: Diagnosis not present

## 2019-12-10 DIAGNOSIS — R768 Other specified abnormal immunological findings in serum: Secondary | ICD-10-CM | POA: Diagnosis not present

## 2019-12-10 DIAGNOSIS — I129 Hypertensive chronic kidney disease with stage 1 through stage 4 chronic kidney disease, or unspecified chronic kidney disease: Secondary | ICD-10-CM | POA: Diagnosis not present

## 2019-12-10 DIAGNOSIS — R801 Persistent proteinuria, unspecified: Secondary | ICD-10-CM | POA: Diagnosis not present

## 2019-12-10 DIAGNOSIS — E1122 Type 2 diabetes mellitus with diabetic chronic kidney disease: Secondary | ICD-10-CM | POA: Diagnosis not present

## 2019-12-11 ENCOUNTER — Ambulatory Visit: Payer: PPO | Admitting: Pulmonary Disease

## 2019-12-13 DIAGNOSIS — E1165 Type 2 diabetes mellitus with hyperglycemia: Secondary | ICD-10-CM | POA: Diagnosis not present

## 2019-12-20 ENCOUNTER — Emergency Department
Admission: EM | Admit: 2019-12-20 | Discharge: 2019-12-20 | Disposition: A | Discharge status: Home / Self Care | Payer: PPO | Attending: Emergency Medicine | Admitting: Emergency Medicine

## 2019-12-20 ENCOUNTER — Emergency Department: Payer: PPO

## 2019-12-20 ENCOUNTER — Encounter: Payer: Self-pay | Admitting: Emergency Medicine

## 2019-12-20 ENCOUNTER — Other Ambulatory Visit: Payer: Self-pay

## 2019-12-20 DIAGNOSIS — R0789 Other chest pain: Secondary | ICD-10-CM | POA: Diagnosis not present

## 2019-12-20 DIAGNOSIS — R519 Headache, unspecified: Secondary | ICD-10-CM

## 2019-12-20 DIAGNOSIS — I129 Hypertensive chronic kidney disease with stage 1 through stage 4 chronic kidney disease, or unspecified chronic kidney disease: Secondary | ICD-10-CM | POA: Insufficient documentation

## 2019-12-20 DIAGNOSIS — Z794 Long term (current) use of insulin: Secondary | ICD-10-CM | POA: Insufficient documentation

## 2019-12-20 DIAGNOSIS — E1122 Type 2 diabetes mellitus with diabetic chronic kidney disease: Secondary | ICD-10-CM | POA: Insufficient documentation

## 2019-12-20 DIAGNOSIS — Z7982 Long term (current) use of aspirin: Secondary | ICD-10-CM | POA: Insufficient documentation

## 2019-12-20 DIAGNOSIS — N183 Chronic kidney disease, stage 3 unspecified: Secondary | ICD-10-CM | POA: Diagnosis not present

## 2019-12-20 DIAGNOSIS — F17210 Nicotine dependence, cigarettes, uncomplicated: Secondary | ICD-10-CM | POA: Insufficient documentation

## 2019-12-20 DIAGNOSIS — Z79899 Other long term (current) drug therapy: Secondary | ICD-10-CM | POA: Insufficient documentation

## 2019-12-20 DIAGNOSIS — I10 Essential (primary) hypertension: Secondary | ICD-10-CM | POA: Diagnosis not present

## 2019-12-20 DIAGNOSIS — J449 Chronic obstructive pulmonary disease, unspecified: Secondary | ICD-10-CM | POA: Insufficient documentation

## 2019-12-20 LAB — TROPONIN I (HIGH SENSITIVITY)
Troponin I (High Sensitivity): 27 ng/L — ABNORMAL HIGH (ref <18)
Troponin I (High Sensitivity): 28 ng/L — ABNORMAL HIGH (ref <18)

## 2019-12-20 LAB — BASIC METABOLIC PANEL
Anion gap: 11 (ref 5–15)
BUN: 35 mg/dL — ABNORMAL HIGH (ref 8–23)
CO2: 27 mmol/L (ref 22–32)
Calcium: 8.7 mg/dL — ABNORMAL LOW (ref 8.9–10.3)
Chloride: 101 mmol/L (ref 98–111)
Creatinine, Ser: 3.1 mg/dL — ABNORMAL HIGH (ref 0.44–1.00)
GFR calc Af Amer: 16 mL/min — ABNORMAL LOW (ref >60)
GFR calc non Af Amer: 14 mL/min — ABNORMAL LOW (ref >60)
Glucose, Bld: 130 mg/dL — ABNORMAL HIGH (ref 70–99)
Potassium: 3.8 mmol/L (ref 3.5–5.1)
Sodium: 139 mmol/L (ref 135–145)

## 2019-12-20 LAB — CBC
HCT: 37.5 % (ref 36.0–46.0)
Hemoglobin: 12 g/dL (ref 12.0–15.0)
MCH: 29.7 pg (ref 26.0–34.0)
MCHC: 32 g/dL (ref 30.0–36.0)
MCV: 92.8 fL (ref 80.0–100.0)
Platelets: 193 10*3/uL (ref 150–400)
RBC: 4.04 MIL/uL (ref 3.87–5.11)
RDW: 14 % (ref 11.5–15.5)
WBC: 6.1 10*3/uL (ref 4.0–10.5)
nRBC: 0 % (ref 0.0–0.2)

## 2019-12-20 MED ORDER — IRBESARTAN 150 MG PO TABS
150.0000 mg | ORAL_TABLET | Freq: Once | ORAL | Status: AC
  Administered 2019-12-20: 150 mg via ORAL
  Filled 2019-12-20: qty 1

## 2019-12-20 MED ORDER — CARVEDILOL 6.25 MG PO TABS
3.1250 mg | ORAL_TABLET | Freq: Once | ORAL | Status: AC
  Administered 2019-12-20: 3.125 mg via ORAL
  Filled 2019-12-20: qty 1

## 2019-12-20 NOTE — ED Triage Notes (Addendum)
Patient presents to the ED with hypertension.  Patient states for the past 3 days she has had intermittent tightness in her chest and intermittent headache.  Patient denies blurred vision.  Patient called her PCP today and was instructed to come to the ED.  Patient state her bp at home was 187/104.  Patient states she is in kidney failure and her doctor told her she would need dialysis soon.

## 2019-12-20 NOTE — ED Notes (Signed)
Patient states she is wanting to leave. Patient encouraged to stay.  Charge notified.

## 2019-12-20 NOTE — ED Provider Notes (Signed)
[facility] Emergency Department Provider Note   ____________________________________________   First MD Initiated Contact with Patient 12/20/19 1708     (approximate)  I have reviewed the triage vital signs and the nursing notes.   HISTORY  Chief Complaint Hypertension    HPI CARAGAN MAZZOLA is a 74 y.o. female with past medical history of hypertension, diabetes, CKD who presents to the ED complaining of elevated blood pressure.  Patient reports that she has had a headache and intermittent tightness in her chest over the past 2 to 3 days.  Chest discomfort is not associated with a deep breath or exertion and she denies any pain in her chest currently.  She has not had any fevers, cough, or shortness of breath.  Headache also seems to have improved and she denies any vision changes, speech changes, numbness, or weakness.  She noticed her blood pressure was high earlier today and spoke with her nephrologist office, who encouraged her to seek care in the ED.  She states she took her morning blood pressure medications, but has not yet taken her evening dose.        Past Medical History:  Diagnosis Date  . Backache, unspecified    s/p c-spine and l-spine fusions  . Degeneration of intervertebral disc, site unspecified   . Depressive disorder, not elsewhere classified   . Disorder of bone and cartilage, unspecified   . HTN (hypertension)   . Hx of left heart catheterization by cutdown    a. 7 years ago; no significant cad; b. Lexiscan 2014: no significant ischemia, no significant EKG changes concerning for ischemia, EF 67%, overall low risk study  . Kidney failure    Dr Cyndia Diver  . Myalgia and myositis, unspecified   . Obesity, unspecified   . Personal history of tobacco use, presenting hazards to health    1/2 ppd  . Plantar fascial fibromatosis   . S/P cholecystectomy   . S/p nephrectomy   . Type II or unspecified type diabetes mellitus without  mention of complication, not stated as uncontrolled   . Unspecified essential hypertension   . Unspecified hereditary and idiopathic peripheral neuropathy    secondary to diabetes    Patient Active Problem List   Diagnosis Date Noted  . Insomnia 09/13/2019  . Pruritus 09/13/2019  . PAD (peripheral artery disease) ([place]) 05/30/2018  . Family history of colon cancer requiring screening colonoscopy 04/06/2018  . Incarcerated ventral hernia 09/07/2017  . Ventral hernia 09/07/2017  . Atherosclerosis of native arteries of extremity with intermittent claudication ([place]) 06/21/2017  . Morbid obesity ([place]) 10/21/2016  . Edema 06/19/2016  . Tobacco abuse counseling 12/17/2015  . Tobacco use disorder 12/11/2015  . Other specified postprocedural states 08/07/2015  . CKD (chronic kidney disease), stage III 05/13/2015  . COPD (chronic obstructive pulmonary disease) with chronic bronchitis ([place]) 01/21/2015  . Abnormal findings on diagnostic imaging of other specified body structures 01/21/2015  . Thyroid mass 12/13/2014  . Hx of left heart catheterization by cutdown   . Single kidney 09/30/2014  . Encounter for general adult medical examination without abnormal findings 03/03/2014  . Personal history of diseases of the blood and blood-forming organs and certain disorders involving the immune mechanism 03/03/2014  . Pain of right leg 01/28/2014  . Irritable bowel syndrome 09/09/2012  . Coronary atherosclerosis 07/26/2009  . Diabetes mellitus type 2 with complications, uncontrolled ([place]) 03/09/2007  . DEPRESSION 03/09/2007  . PERIPHERAL NEUROPATHY 03/09/2007  . Essential hypertension 03/09/2007  .  DEGENERATIVE DISC DISEASE 03/09/2007  . BACK PAIN, CHRONIC 03/09/2007  . FIBROMYALGIA 03/09/2007  . Disorder of bone 03/09/2007  . Major depressive disorder, single episode 03/09/2007  . Narrowing of intervertebral disc space 03/09/2007  . 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 History of total  knee replacement, right 08/04/2006  . History of total knee replacement, left 11/10/2004    Past Surgical History:  Procedure Laterality Date  . ABDOMINAL HYSTERECTOMY    . APPENDECTOMY    . BACK SURGERY     2006  . CARDIAC CATHETERIZATION     o.k. 2003  . CARPAL TUNNEL RELEASE    . CERVICAL DISCECTOMY    . CHOLECYSTECTOMY    . COLONOSCOPY  09/1999   colonoscopy-hemorrhoids, re-check 5 years (10/2006)  . dexa     osteopenia (01/2004)  . ELBOW SURGERY    . FOOT SURGERY     x 3  . KIDNEY DONATION  1991  . LUMBAR FUSION    . NECK SURGERY     06/2005  . Problem with general Anesthesia     02/2006  . REPLACEMENT TOTAL KNEE  10/2004  . trigger finger surgery    . VENTRAL HERNIA REPAIR N/A 09/08/2017   Primary repair of a 10 x 15 mm infraumbilical fascial defect.  Surgeon: Robert Bellow, MD;  Location: [facility] ORS;  Service: General;  Laterality: N/A;  . VESICOVAGINAL FISTULA CLOSURE W/ TAH    . VIDEO BRONCHOSCOPY Bilateral 04/02/2015   Procedure: VIDEO BRONCHOSCOPY WITHOUT FLUORO;  Surgeon: Juanito Doom, MD;  Location[facility] END[facility]COPY;  Service: Cardiopulmonary;  Laterality: Bilateral;    Prior to Admission medications   Medication Sig Start Date End Date Taking? Authorizing Provider  albuterol (PROVENTIL HFA) 108 (90 Base) MCG/ACT inhaler Inhale 2 puffs every 4 (four) hours as needed into the lungs for wheezing or shortness of breath. 09/26/17   Elby Beck, FNP  albuterol (PROVENTIL) (2.5 MG/3ML) 0.083% nebulizer solution Take 3 mLs (2.5 mg total) by nebulization every 4 (four) hours as needed for wheezing or shortness of breath. 12/28/18   Jodelle Green, FNP  amLODipine (NORVASC) 10 MG tablet TAKE 1 TABLET BY MOUTH DAILY 08/24/19   Crecencio Mc, MD  aspirin 81 MG tablet Take 1 tablet (81 mg total) by mouth daily. 06/19/13   Minna Merritts, MD  atorvastatin (LIPITOR) 20 MG tablet TAKE 1 TABLET BY MOUTH DAILY 08/28/19   Crecencio Mc, MD  budesonide (PULMICORT)  0.25 MG/2ML nebulizer solution Take 2 mLs (0.25 mg total) by nebulization 2 (two) times daily. 01/08/19   Crecencio Mc, MD  budesonide-formoterol (SYMBICORT) 160-4.5 MCG/ACT inhaler Inhale 2 puffs into the lungs 2 (two) times daily. 07/12/19   Juanito Doom, MD  buPROPion (WELLBUTRIN SR) 100 MG 12 hr tablet Take 1 tablet (100 mg total) by mouth 2 (two) times daily. 09/12/19   Crecencio Mc, MD  calcium carbonate (OS-CAL) 600 MG TABS Take 600 mg by mouth daily.      [provider]  carvedilol (COREG) 3.125 MG tablet Take 1 tablet (3.125 mg total) by mouth 2 (two) times daily with a meal. 04/11/19   Gollan, Kathlene November, MD  DAILY MULTIPLE VITAMINS PO Take 1 tablet by mouth daily.      [provider]  insulin lispro (HUMALOG) 100 UNIT/ML injection Inject into the skin as directed. Sliding scale 100-150 = 2-2.5 units 150-200 = 3-3.5 units 200-250 = 4-4.5 units 251-300 = 5-5.5 units 301-350 =  6-6.5 units 350-400 = 7-7.5 units    [provider]  insulin NPH (HUMULIN N,NOVOLIN N) 100 UNIT/ML injection Inject 22 Units into the skin daily before breakfast. 24 units at bedtime    [provider]  irbesartan (AVAPRO) 150 MG tablet Take 150 mg by mouth 2 (two) times daily.  06/22/17   [provider]  Magnesium 500 MG CAPS Take by mouth.    [provider]  metolazone (ZAROXOLYN) 2.5 MG tablet TAKE 1 TABLET BY MOUTH DAILY 30 MINUTES PRIOR TO TORSEMIDE 06/19/19   Crecencio Mc, MD  minoxidil (LONITEN) 10 MG tablet  09/10/19   [provider]  nitroGLYCERIN (NITROSTAT) 0.4 MG SL tablet Place 1 tablet (0.4 mg total) under the tongue every 5 (five) minutes as needed. Maximum 3 tablets Patient taking differently: Place 0.4 mg under the tongue every 5 (five) minutes as needed for chest pain. Maximum 3 tablets 06/19/13   Minna Merritts, MD  Spacer/Aero-Holding Chambers (AEROCHAMBER MV) inhaler Use as instructed 01/20/15   Juanito Doom, MD    torsemide (DEMADEX) 5 MG tablet TAKE 1 TABLET BY MOUTH DAILY 02/26/19   Crecencio Mc, MD  traZODone (DESYREL) 50 MG tablet Take 0.5-1 tablets (25-50 mg total) by mouth at bedtime as needed for sleep. 09/12/19   Crecencio Mc, MD  triamcinolone cream (KENALOG) 0.1 % Apply 1 application topically 2 (two) times daily. TO LEFT KNEE 09/12/19   Crecencio Mc, MD  valsartan-hydrochlorothiazide (DIOVAN-HCT) 320-25 MG per tablet TAKE ONE (1) TABLET EACH DAY 04/15/15 10/29/15  Minna Merritts, MD    Allergies Morphine, Prednisone, Gabapentin, Hydralazine, Nsaids, and Penicillins  Family History  Problem Relation Age of Onset  . Hypertension Mother   . Colon cancer Mother   . Cancer Mother        colon  . Coronary artery disease Mother   . Diabetes Mother   . Prostate cancer Father   . COPD Father   . Cancer Father        prostate  . Heart attack Brother 12  . Sleep apnea Brother   . Diabetes Brother   . Diabetes Paternal Aunt   . Diabetes Paternal Uncle   . Cancer Daughter        Brain, lungs, liver, spine, kidney    Social History Social History   Tobacco Use  . Smoking status: Current Every Day Smoker    Packs/day: 0.25    Years: 30.00    Pack years: 7.50    Types: Cigarettes  . Smokeless tobacco: Never Used  . Tobacco comment: currently smoking .25 ppd, trying to quit 02/13/16  Substance Use Topics  . Alcohol use: No    Alcohol/week: 0.0 standard drinks  . Drug use: No    Review of Systems  Constitutional: No fever/chills Eyes: No visual changes. ENT: No sore throat. Cardiovascular: Positive for chest pain. Respiratory: Denies shortness of breath. Gastrointestinal: No abdominal pain.  No nausea, no vomiting.  No diarrhea.  No constipation. Genitourinary: Negative for dysuria. Musculoskeletal: Negative for back pain. Skin: Negative for rash. Neurological: Positive for headaches, negative for focal weakness or  numbness.  ____________________________________________   PHYSICAL EXAM:  VITAL SIGNS: ED Triage Vitals  Enc Vitals Group     BP 12/20/19 1457 (!) 207/90     Pulse Rate 12/20/19 1457 64     Resp 12/20/19 1457 18     Temp 12/20/19 1457 98.4 F (36.9 C)  Temp Source 12/20/19 1457 Oral     SpO2 12/20/19 1457 93 %     Weight 12/20/19 1459 183 lb (83 kg)     Height 12/20/19 1459 4\' 11"  (1.499 m)     Head Circumference --      Peak Flow --      Pain Score 12/20/19 1457 8     Pain Loc --      Pain Edu? --      Excl. in [place]? --     Constitutional: Alert and oriented. Eyes: Conjunctivae are normal. Head: Atraumatic. Nose: No congestion/rhinnorhea. Mouth/Throat: Mucous membranes are moist. Neck: Normal ROM Cardiovascular: Normal rate, regular rhythm. Grossly normal heart sounds. Respiratory: Normal respiratory effort.  No retractions. Lungs CTAB. Gastrointestinal: Soft and nontender. No distention. Genitourinary: deferred Musculoskeletal: No lower extremity tenderness nor edema. Neurologic:  Normal speech and language. No gross focal neurologic deficits are appreciated.  5 out of 5 strength in bilateral upper and lower extremities, no plantar drift. Skin:  Skin is warm, dry and intact. No rash noted. Psychiatric: Mood and affect are normal. Speech and behavior are normal.  ____________________________________________   LABS (all labs ordered are listed, but only abnormal results are displayed)  Labs Reviewed  BASIC METABOLIC PANEL - Abnormal; Notable for the following components:      Result Value   Glucose, Bld 130 (*)    BUN 35 (*)    Creatinine, Ser 3.10 (*)    Calcium 8.7 (*)    GFR calc non Af Amer 14 (*)    GFR calc Af Amer 16 (*)    All other components within normal limits  TROPONIN I (HIGH SENSITIVITY) - Abnormal; Notable for the following components:   Troponin I (High Sensitivity) 27 (*)    All other components within normal limits  TROPONIN I (HIGH  SENSITIVITY) - Abnormal; Notable for the following components:   Troponin I (High Sensitivity) 28 (*)    All other components within normal limits  CBC   ____________________________________________  EKG  ED ECG REPORT I, Blake Divine, the attending physician, personally viewed and interpreted this ECG.   Date: 12/20/2019  EKG Time: 14:53  Rate: 65  Rhythm: normal sinus rhythm, PVCs  Axis: Normal  Intervals:none  ST&T Change: None   PROCEDURES  Procedure(s) performed (including Critical Care):  Procedures   ____________________________________________   INITIAL IMPRESSION / ASSESSMENT AND PLAN / ED COURSE       74 year old female with history of hypertension and CKD presents to the ED with headache, intermittent chest tightness, and elevated blood pressure at home.  She states her headache is improved and she has a nonfocal neurologic exam, doubt intracranial process.  She also denies any chest pain at this time, EKG without acute ischemic changes and troponin is unremarkable, mildly elevated to expected level due to her chronic kidney disease.  Her renal function is very slightly elevated compared to prior, but no significant AKI or electrolyte abnormality.  No apparent hypertensive emergency, if repeat troponin is stable then she will be appropriate for outpatient follow-up with her nephrologist.  We will give her evening dose of blood pressure medications.  Repeat troponin is stable, patient's blood pressure remains elevated but she is requesting to be discharged home.  I have counseled her to follow-up with her PCP for reevaluation of blood pressure, otherwise return to the ED for new or worsening symptoms.  Patient is to plan.      ____________________________________________   FINAL CLINICAL  IMPRESSION(S) / ED DIAGNOSES  Final diagnoses:  Essential hypertension  Acute nonintractable headache, unspecified headache type  Chest tightness     ED Discharge  Orders    None       Note:  This document was prepared using Dragon voice recognition software and may include unintentional dictation errors.   Blake Divine, MD 12/20/19 2002

## 2019-12-24 ENCOUNTER — Ambulatory Visit: Payer: PPO | Admitting: Pulmonary Disease

## 2019-12-25 ENCOUNTER — Encounter: Payer: Self-pay | Admitting: Internal Medicine

## 2019-12-26 ENCOUNTER — Other Ambulatory Visit: Payer: Self-pay

## 2019-12-26 DIAGNOSIS — R319 Hematuria, unspecified: Secondary | ICD-10-CM | POA: Diagnosis not present

## 2019-12-26 DIAGNOSIS — N2581 Secondary hyperparathyroidism of renal origin: Secondary | ICD-10-CM | POA: Diagnosis not present

## 2019-12-26 DIAGNOSIS — E1122 Type 2 diabetes mellitus with diabetic chronic kidney disease: Secondary | ICD-10-CM | POA: Diagnosis not present

## 2019-12-26 DIAGNOSIS — N184 Chronic kidney disease, stage 4 (severe): Secondary | ICD-10-CM | POA: Diagnosis not present

## 2019-12-26 DIAGNOSIS — Z524 Kidney donor: Secondary | ICD-10-CM | POA: Diagnosis not present

## 2019-12-26 DIAGNOSIS — R768 Other specified abnormal immunological findings in serum: Secondary | ICD-10-CM | POA: Diagnosis not present

## 2019-12-26 DIAGNOSIS — I129 Hypertensive chronic kidney disease with stage 1 through stage 4 chronic kidney disease, or unspecified chronic kidney disease: Secondary | ICD-10-CM | POA: Diagnosis not present

## 2019-12-26 DIAGNOSIS — R801 Persistent proteinuria, unspecified: Secondary | ICD-10-CM | POA: Diagnosis not present

## 2019-12-26 DIAGNOSIS — D631 Anemia in chronic kidney disease: Secondary | ICD-10-CM | POA: Diagnosis not present

## 2020-01-04 DIAGNOSIS — N186 End stage renal disease: Secondary | ICD-10-CM | POA: Diagnosis not present

## 2020-01-04 DIAGNOSIS — I5033 Acute on chronic diastolic (congestive) heart failure: Secondary | ICD-10-CM | POA: Diagnosis not present

## 2020-01-04 DIAGNOSIS — Z794 Long term (current) use of insulin: Secondary | ICD-10-CM | POA: Diagnosis not present

## 2020-01-04 DIAGNOSIS — I739 Peripheral vascular disease, unspecified: Secondary | ICD-10-CM | POA: Diagnosis not present

## 2020-01-04 DIAGNOSIS — E118 Type 2 diabetes mellitus with unspecified complications: Secondary | ICD-10-CM | POA: Diagnosis not present

## 2020-01-07 DIAGNOSIS — I10 Essential (primary) hypertension: Secondary | ICD-10-CM | POA: Diagnosis not present

## 2020-01-07 DIAGNOSIS — G609 Hereditary and idiopathic neuropathy, unspecified: Secondary | ICD-10-CM | POA: Diagnosis not present

## 2020-01-07 DIAGNOSIS — E7800 Pure hypercholesterolemia, unspecified: Secondary | ICD-10-CM | POA: Diagnosis not present

## 2020-01-07 DIAGNOSIS — R809 Proteinuria, unspecified: Secondary | ICD-10-CM | POA: Diagnosis not present

## 2020-01-07 DIAGNOSIS — E1165 Type 2 diabetes mellitus with hyperglycemia: Secondary | ICD-10-CM | POA: Diagnosis not present

## 2020-01-10 DIAGNOSIS — R319 Hematuria, unspecified: Secondary | ICD-10-CM | POA: Diagnosis not present

## 2020-01-10 DIAGNOSIS — D631 Anemia in chronic kidney disease: Secondary | ICD-10-CM | POA: Diagnosis not present

## 2020-01-10 DIAGNOSIS — I129 Hypertensive chronic kidney disease with stage 1 through stage 4 chronic kidney disease, or unspecified chronic kidney disease: Secondary | ICD-10-CM | POA: Diagnosis not present

## 2020-01-10 DIAGNOSIS — R801 Persistent proteinuria, unspecified: Secondary | ICD-10-CM | POA: Diagnosis not present

## 2020-01-10 DIAGNOSIS — Z524 Kidney donor: Secondary | ICD-10-CM | POA: Diagnosis not present

## 2020-01-10 DIAGNOSIS — N2581 Secondary hyperparathyroidism of renal origin: Secondary | ICD-10-CM | POA: Diagnosis not present

## 2020-01-10 DIAGNOSIS — N184 Chronic kidney disease, stage 4 (severe): Secondary | ICD-10-CM | POA: Diagnosis not present

## 2020-01-10 DIAGNOSIS — R768 Other specified abnormal immunological findings in serum: Secondary | ICD-10-CM | POA: Diagnosis not present

## 2020-01-10 DIAGNOSIS — E1122 Type 2 diabetes mellitus with diabetic chronic kidney disease: Secondary | ICD-10-CM | POA: Diagnosis not present

## 2020-01-10 LAB — BASIC METABOLIC PANEL
BUN: 31 — AB (ref 4–21)
CO2: 29 — AB (ref 13–22)
Chloride: 103 (ref 99–108)
Creatinine: 2.8 — AB (ref 0.5–1.1)
Potassium: 4.1 (ref 3.4–5.3)
Sodium: 138 (ref 137–147)

## 2020-01-10 LAB — CBC AND DIFFERENTIAL
HCT: 37 (ref 36–46)
Hemoglobin: 12.1 (ref 12.0–16.0)
Platelets: 208 (ref 150–399)
WBC: 7

## 2020-01-10 LAB — COMPREHENSIVE METABOLIC PANEL
Albumin: 3.3 — AB (ref 3.5–5.0)
GFR calc Af Amer: 18
GFR calc non Af Amer: 16

## 2020-01-13 DIAGNOSIS — E1165 Type 2 diabetes mellitus with hyperglycemia: Secondary | ICD-10-CM | POA: Diagnosis not present

## 2020-01-14 DIAGNOSIS — R319 Hematuria, unspecified: Secondary | ICD-10-CM | POA: Diagnosis not present

## 2020-01-14 DIAGNOSIS — I129 Hypertensive chronic kidney disease with stage 1 through stage 4 chronic kidney disease, or unspecified chronic kidney disease: Secondary | ICD-10-CM | POA: Diagnosis not present

## 2020-01-14 DIAGNOSIS — D631 Anemia in chronic kidney disease: Secondary | ICD-10-CM | POA: Diagnosis not present

## 2020-01-14 DIAGNOSIS — N2581 Secondary hyperparathyroidism of renal origin: Secondary | ICD-10-CM | POA: Diagnosis not present

## 2020-01-14 DIAGNOSIS — N184 Chronic kidney disease, stage 4 (severe): Secondary | ICD-10-CM | POA: Diagnosis not present

## 2020-01-14 DIAGNOSIS — R801 Persistent proteinuria, unspecified: Secondary | ICD-10-CM | POA: Diagnosis not present

## 2020-01-16 DIAGNOSIS — N184 Chronic kidney disease, stage 4 (severe): Secondary | ICD-10-CM | POA: Diagnosis not present

## 2020-01-18 DIAGNOSIS — E119 Type 2 diabetes mellitus without complications: Secondary | ICD-10-CM | POA: Diagnosis not present

## 2020-01-30 ENCOUNTER — Telehealth: Payer: Self-pay | Admitting: Internal Medicine

## 2020-01-30 DIAGNOSIS — I5033 Acute on chronic diastolic (congestive) heart failure: Secondary | ICD-10-CM | POA: Diagnosis not present

## 2020-01-30 DIAGNOSIS — J441 Chronic obstructive pulmonary disease with (acute) exacerbation: Secondary | ICD-10-CM | POA: Diagnosis not present

## 2020-01-30 NOTE — Telephone Encounter (Signed)
-----   Message from Salvatore Marvel sent at 01/29/2020  9:13 AM EDT ----- Regarding: removed pcp Sharlyne Cai, Pt has changed providers. Can you please remove Dr Derrel Nip as her pcp?  Thanks, Ebony Hail

## 2020-02-12 ENCOUNTER — Other Ambulatory Visit: Payer: Self-pay

## 2020-02-12 DIAGNOSIS — E1165 Type 2 diabetes mellitus with hyperglycemia: Secondary | ICD-10-CM | POA: Diagnosis not present

## 2020-02-20 DIAGNOSIS — D631 Anemia in chronic kidney disease: Secondary | ICD-10-CM | POA: Diagnosis not present

## 2020-02-20 DIAGNOSIS — N184 Chronic kidney disease, stage 4 (severe): Secondary | ICD-10-CM | POA: Diagnosis not present

## 2020-02-20 DIAGNOSIS — R801 Persistent proteinuria, unspecified: Secondary | ICD-10-CM | POA: Diagnosis not present

## 2020-02-20 DIAGNOSIS — N2581 Secondary hyperparathyroidism of renal origin: Secondary | ICD-10-CM | POA: Diagnosis not present

## 2020-02-20 DIAGNOSIS — R319 Hematuria, unspecified: Secondary | ICD-10-CM | POA: Diagnosis not present

## 2020-02-20 DIAGNOSIS — I129 Hypertensive chronic kidney disease with stage 1 through stage 4 chronic kidney disease, or unspecified chronic kidney disease: Secondary | ICD-10-CM | POA: Diagnosis not present

## 2020-02-20 LAB — BASIC METABOLIC PANEL
BUN: 38 — AB (ref 4–21)
CO2: 29 — AB (ref 13–22)
Chloride: 104 (ref 99–108)
Creatinine: 2.8 — AB (ref 0.5–1.1)
Glucose: 279
Potassium: 4.4 (ref 3.4–5.3)
Sodium: 139 (ref 137–147)

## 2020-02-20 LAB — COMPREHENSIVE METABOLIC PANEL
Calcium: 8.8 (ref 8.7–10.7)
GFR calc Af Amer: 19
GFR calc non Af Amer: 16

## 2020-02-20 LAB — CBC AND DIFFERENTIAL
HCT: 37 (ref 36–46)
Hemoglobin: 11.8 — AB (ref 12.0–16.0)
Platelets: 183 (ref 150–399)
WBC: 7.5

## 2020-02-25 DIAGNOSIS — R801 Persistent proteinuria, unspecified: Secondary | ICD-10-CM | POA: Diagnosis not present

## 2020-02-25 DIAGNOSIS — R768 Other specified abnormal immunological findings in serum: Secondary | ICD-10-CM | POA: Diagnosis not present

## 2020-02-25 DIAGNOSIS — E1122 Type 2 diabetes mellitus with diabetic chronic kidney disease: Secondary | ICD-10-CM | POA: Diagnosis not present

## 2020-02-25 DIAGNOSIS — R319 Hematuria, unspecified: Secondary | ICD-10-CM | POA: Diagnosis not present

## 2020-02-25 DIAGNOSIS — N2581 Secondary hyperparathyroidism of renal origin: Secondary | ICD-10-CM | POA: Diagnosis not present

## 2020-02-25 DIAGNOSIS — Z524 Kidney donor: Secondary | ICD-10-CM | POA: Diagnosis not present

## 2020-02-25 DIAGNOSIS — N184 Chronic kidney disease, stage 4 (severe): Secondary | ICD-10-CM | POA: Diagnosis not present

## 2020-02-25 DIAGNOSIS — I129 Hypertensive chronic kidney disease with stage 1 through stage 4 chronic kidney disease, or unspecified chronic kidney disease: Secondary | ICD-10-CM | POA: Diagnosis not present

## 2020-02-25 DIAGNOSIS — D631 Anemia in chronic kidney disease: Secondary | ICD-10-CM | POA: Diagnosis not present

## 2020-03-04 ENCOUNTER — Encounter (INDEPENDENT_AMBULATORY_CARE_PROVIDER_SITE_OTHER): Payer: PPO

## 2020-03-04 ENCOUNTER — Ambulatory Visit (INDEPENDENT_AMBULATORY_CARE_PROVIDER_SITE_OTHER): Payer: PPO | Admitting: Vascular Surgery

## 2020-03-12 ENCOUNTER — Ambulatory Visit: Payer: PPO | Admitting: Internal Medicine

## 2020-03-14 DIAGNOSIS — E1165 Type 2 diabetes mellitus with hyperglycemia: Secondary | ICD-10-CM | POA: Diagnosis not present

## 2020-03-20 DIAGNOSIS — I129 Hypertensive chronic kidney disease with stage 1 through stage 4 chronic kidney disease, or unspecified chronic kidney disease: Secondary | ICD-10-CM | POA: Diagnosis not present

## 2020-03-20 DIAGNOSIS — N184 Chronic kidney disease, stage 4 (severe): Secondary | ICD-10-CM | POA: Diagnosis not present

## 2020-03-20 DIAGNOSIS — R319 Hematuria, unspecified: Secondary | ICD-10-CM | POA: Diagnosis not present

## 2020-03-20 DIAGNOSIS — N2581 Secondary hyperparathyroidism of renal origin: Secondary | ICD-10-CM | POA: Diagnosis not present

## 2020-03-20 DIAGNOSIS — R768 Other specified abnormal immunological findings in serum: Secondary | ICD-10-CM | POA: Diagnosis not present

## 2020-03-20 DIAGNOSIS — R801 Persistent proteinuria, unspecified: Secondary | ICD-10-CM | POA: Diagnosis not present

## 2020-03-20 DIAGNOSIS — D631 Anemia in chronic kidney disease: Secondary | ICD-10-CM | POA: Diagnosis not present

## 2020-03-20 DIAGNOSIS — Z524 Kidney donor: Secondary | ICD-10-CM | POA: Diagnosis not present

## 2020-03-20 DIAGNOSIS — E1122 Type 2 diabetes mellitus with diabetic chronic kidney disease: Secondary | ICD-10-CM | POA: Diagnosis not present

## 2020-03-21 ENCOUNTER — Other Ambulatory Visit: Payer: Self-pay

## 2020-03-21 DIAGNOSIS — E1122 Type 2 diabetes mellitus with diabetic chronic kidney disease: Secondary | ICD-10-CM | POA: Diagnosis not present

## 2020-03-21 DIAGNOSIS — N185 Chronic kidney disease, stage 5: Secondary | ICD-10-CM | POA: Diagnosis not present

## 2020-03-21 DIAGNOSIS — I120 Hypertensive chronic kidney disease with stage 5 chronic kidney disease or end stage renal disease: Secondary | ICD-10-CM | POA: Diagnosis not present

## 2020-03-25 DIAGNOSIS — Z905 Acquired absence of kidney: Secondary | ICD-10-CM | POA: Diagnosis not present

## 2020-03-25 DIAGNOSIS — Z6837 Body mass index (BMI) 37.0-37.9, adult: Secondary | ICD-10-CM | POA: Diagnosis not present

## 2020-03-25 DIAGNOSIS — Z794 Long term (current) use of insulin: Secondary | ICD-10-CM | POA: Diagnosis not present

## 2020-03-25 DIAGNOSIS — R630 Anorexia: Secondary | ICD-10-CM | POA: Diagnosis not present

## 2020-03-25 DIAGNOSIS — D631 Anemia in chronic kidney disease: Secondary | ICD-10-CM | POA: Diagnosis not present

## 2020-03-25 DIAGNOSIS — M7989 Other specified soft tissue disorders: Secondary | ICD-10-CM | POA: Diagnosis not present

## 2020-03-25 DIAGNOSIS — N186 End stage renal disease: Secondary | ICD-10-CM | POA: Diagnosis not present

## 2020-03-25 DIAGNOSIS — E1129 Type 2 diabetes mellitus with other diabetic kidney complication: Secondary | ICD-10-CM | POA: Diagnosis not present

## 2020-03-25 DIAGNOSIS — K660 Peritoneal adhesions (postprocedural) (postinfection): Secondary | ICD-10-CM | POA: Diagnosis not present

## 2020-03-25 DIAGNOSIS — N185 Chronic kidney disease, stage 5: Secondary | ICD-10-CM | POA: Diagnosis not present

## 2020-03-25 DIAGNOSIS — E1122 Type 2 diabetes mellitus with diabetic chronic kidney disease: Secondary | ICD-10-CM | POA: Diagnosis not present

## 2020-03-25 DIAGNOSIS — I120 Hypertensive chronic kidney disease with stage 5 chronic kidney disease or end stage renal disease: Secondary | ICD-10-CM | POA: Diagnosis not present

## 2020-03-25 DIAGNOSIS — Z524 Kidney donor: Secondary | ICD-10-CM | POA: Diagnosis not present

## 2020-03-26 DIAGNOSIS — R319 Hematuria, unspecified: Secondary | ICD-10-CM | POA: Diagnosis not present

## 2020-03-26 DIAGNOSIS — D631 Anemia in chronic kidney disease: Secondary | ICD-10-CM | POA: Diagnosis not present

## 2020-03-26 DIAGNOSIS — I129 Hypertensive chronic kidney disease with stage 1 through stage 4 chronic kidney disease, or unspecified chronic kidney disease: Secondary | ICD-10-CM | POA: Diagnosis not present

## 2020-03-26 DIAGNOSIS — N2581 Secondary hyperparathyroidism of renal origin: Secondary | ICD-10-CM | POA: Diagnosis not present

## 2020-03-26 DIAGNOSIS — Z524 Kidney donor: Secondary | ICD-10-CM | POA: Diagnosis not present

## 2020-03-26 DIAGNOSIS — N185 Chronic kidney disease, stage 5: Secondary | ICD-10-CM | POA: Diagnosis not present

## 2020-03-26 DIAGNOSIS — R768 Other specified abnormal immunological findings in serum: Secondary | ICD-10-CM | POA: Diagnosis not present

## 2020-03-26 DIAGNOSIS — E1122 Type 2 diabetes mellitus with diabetic chronic kidney disease: Secondary | ICD-10-CM | POA: Diagnosis not present

## 2020-03-26 DIAGNOSIS — R801 Persistent proteinuria, unspecified: Secondary | ICD-10-CM | POA: Diagnosis not present

## 2020-04-01 ENCOUNTER — Ambulatory Visit: Payer: PPO | Admitting: Cardiovascular Disease

## 2020-04-08 DIAGNOSIS — E118 Type 2 diabetes mellitus with unspecified complications: Secondary | ICD-10-CM | POA: Diagnosis not present

## 2020-04-08 DIAGNOSIS — I739 Peripheral vascular disease, unspecified: Secondary | ICD-10-CM | POA: Diagnosis not present

## 2020-04-08 DIAGNOSIS — Z0000 Encounter for general adult medical examination without abnormal findings: Secondary | ICD-10-CM | POA: Diagnosis not present

## 2020-04-08 DIAGNOSIS — I5033 Acute on chronic diastolic (congestive) heart failure: Secondary | ICD-10-CM | POA: Diagnosis not present

## 2020-04-08 DIAGNOSIS — Z794 Long term (current) use of insulin: Secondary | ICD-10-CM | POA: Diagnosis not present

## 2020-04-13 DIAGNOSIS — E1165 Type 2 diabetes mellitus with hyperglycemia: Secondary | ICD-10-CM | POA: Diagnosis not present

## 2020-04-15 DIAGNOSIS — I10 Essential (primary) hypertension: Secondary | ICD-10-CM | POA: Diagnosis not present

## 2020-04-15 DIAGNOSIS — E1165 Type 2 diabetes mellitus with hyperglycemia: Secondary | ICD-10-CM | POA: Diagnosis not present

## 2020-04-15 DIAGNOSIS — G609 Hereditary and idiopathic neuropathy, unspecified: Secondary | ICD-10-CM | POA: Diagnosis not present

## 2020-04-15 DIAGNOSIS — E162 Hypoglycemia, unspecified: Secondary | ICD-10-CM | POA: Diagnosis not present

## 2020-04-22 DIAGNOSIS — N2581 Secondary hyperparathyroidism of renal origin: Secondary | ICD-10-CM | POA: Diagnosis not present

## 2020-04-22 DIAGNOSIS — Z992 Dependence on renal dialysis: Secondary | ICD-10-CM | POA: Diagnosis not present

## 2020-04-22 DIAGNOSIS — N186 End stage renal disease: Secondary | ICD-10-CM | POA: Diagnosis not present

## 2020-04-25 DIAGNOSIS — E875 Hyperkalemia: Secondary | ICD-10-CM | POA: Diagnosis not present

## 2020-04-25 DIAGNOSIS — N186 End stage renal disease: Secondary | ICD-10-CM | POA: Diagnosis not present

## 2020-04-25 DIAGNOSIS — D509 Iron deficiency anemia, unspecified: Secondary | ICD-10-CM | POA: Diagnosis not present

## 2020-04-25 DIAGNOSIS — R82998 Other abnormal findings in urine: Secondary | ICD-10-CM | POA: Diagnosis not present

## 2020-04-25 DIAGNOSIS — N2581 Secondary hyperparathyroidism of renal origin: Secondary | ICD-10-CM | POA: Diagnosis not present

## 2020-04-25 DIAGNOSIS — E1129 Type 2 diabetes mellitus with other diabetic kidney complication: Secondary | ICD-10-CM | POA: Diagnosis not present

## 2020-04-25 DIAGNOSIS — N2589 Other disorders resulting from impaired renal tubular function: Secondary | ICD-10-CM | POA: Diagnosis not present

## 2020-04-25 DIAGNOSIS — E789 Disorder of lipoprotein metabolism, unspecified: Secondary | ICD-10-CM | POA: Diagnosis not present

## 2020-04-25 DIAGNOSIS — Z992 Dependence on renal dialysis: Secondary | ICD-10-CM | POA: Diagnosis not present

## 2020-05-03 DIAGNOSIS — Z992 Dependence on renal dialysis: Secondary | ICD-10-CM | POA: Diagnosis not present

## 2020-05-03 DIAGNOSIS — N186 End stage renal disease: Secondary | ICD-10-CM | POA: Diagnosis not present

## 2020-05-03 DIAGNOSIS — N2581 Secondary hyperparathyroidism of renal origin: Secondary | ICD-10-CM | POA: Diagnosis not present

## 2020-05-14 DIAGNOSIS — E1165 Type 2 diabetes mellitus with hyperglycemia: Secondary | ICD-10-CM | POA: Diagnosis not present

## 2020-05-14 DIAGNOSIS — Z992 Dependence on renal dialysis: Secondary | ICD-10-CM | POA: Diagnosis not present

## 2020-05-14 DIAGNOSIS — N186 End stage renal disease: Secondary | ICD-10-CM | POA: Diagnosis not present

## 2020-05-15 DIAGNOSIS — D631 Anemia in chronic kidney disease: Secondary | ICD-10-CM | POA: Diagnosis not present

## 2020-05-15 DIAGNOSIS — N2581 Secondary hyperparathyroidism of renal origin: Secondary | ICD-10-CM | POA: Diagnosis not present

## 2020-05-15 DIAGNOSIS — N2589 Other disorders resulting from impaired renal tubular function: Secondary | ICD-10-CM | POA: Diagnosis not present

## 2020-05-15 DIAGNOSIS — D509 Iron deficiency anemia, unspecified: Secondary | ICD-10-CM | POA: Diagnosis not present

## 2020-05-15 DIAGNOSIS — R880 Cloudy (hemodialysis) (peritoneal) dialysis effluent: Secondary | ICD-10-CM | POA: Diagnosis not present

## 2020-05-15 DIAGNOSIS — Z79899 Other long term (current) drug therapy: Secondary | ICD-10-CM | POA: Diagnosis not present

## 2020-05-15 DIAGNOSIS — E440 Moderate protein-calorie malnutrition: Secondary | ICD-10-CM | POA: Diagnosis not present

## 2020-05-15 DIAGNOSIS — R82998 Other abnormal findings in urine: Secondary | ICD-10-CM | POA: Diagnosis not present

## 2020-05-15 DIAGNOSIS — N186 End stage renal disease: Secondary | ICD-10-CM | POA: Diagnosis not present

## 2020-05-15 DIAGNOSIS — Z992 Dependence on renal dialysis: Secondary | ICD-10-CM | POA: Diagnosis not present

## 2020-05-22 ENCOUNTER — Telehealth: Payer: Self-pay

## 2020-05-22 NOTE — Telephone Encounter (Signed)
Patient has been notified that the low dose lung cancer screening CT scan is due currently or will be in near future.   There was issues with her insurance for the last lung screening CT scan and does not want to schedule another.

## 2020-06-13 DIAGNOSIS — E1165 Type 2 diabetes mellitus with hyperglycemia: Secondary | ICD-10-CM | POA: Diagnosis not present

## 2020-06-14 DIAGNOSIS — N186 End stage renal disease: Secondary | ICD-10-CM | POA: Diagnosis not present

## 2020-06-14 DIAGNOSIS — Z992 Dependence on renal dialysis: Secondary | ICD-10-CM | POA: Diagnosis not present

## 2020-06-15 DIAGNOSIS — N2589 Other disorders resulting from impaired renal tubular function: Secondary | ICD-10-CM | POA: Diagnosis not present

## 2020-06-15 DIAGNOSIS — N186 End stage renal disease: Secondary | ICD-10-CM | POA: Diagnosis not present

## 2020-06-15 DIAGNOSIS — E440 Moderate protein-calorie malnutrition: Secondary | ICD-10-CM | POA: Diagnosis not present

## 2020-06-15 DIAGNOSIS — Z79899 Other long term (current) drug therapy: Secondary | ICD-10-CM | POA: Diagnosis not present

## 2020-06-15 DIAGNOSIS — N2581 Secondary hyperparathyroidism of renal origin: Secondary | ICD-10-CM | POA: Diagnosis not present

## 2020-06-15 DIAGNOSIS — D509 Iron deficiency anemia, unspecified: Secondary | ICD-10-CM | POA: Diagnosis not present

## 2020-06-15 DIAGNOSIS — D631 Anemia in chronic kidney disease: Secondary | ICD-10-CM | POA: Diagnosis not present

## 2020-06-15 DIAGNOSIS — Z992 Dependence on renal dialysis: Secondary | ICD-10-CM | POA: Diagnosis not present

## 2020-06-15 DIAGNOSIS — R82998 Other abnormal findings in urine: Secondary | ICD-10-CM | POA: Diagnosis not present

## 2020-06-30 ENCOUNTER — Other Ambulatory Visit: Payer: Self-pay | Admitting: Nephrology

## 2020-06-30 DIAGNOSIS — R1032 Left lower quadrant pain: Secondary | ICD-10-CM

## 2020-06-30 DIAGNOSIS — N186 End stage renal disease: Secondary | ICD-10-CM

## 2020-07-08 ENCOUNTER — Ambulatory Visit
Admission: RE | Admit: 2020-07-08 | Discharge: 2020-07-08 | Disposition: A | Discharge status: Home / Self Care | Payer: PPO | Attending: Nephrology | Admitting: Nephrology

## 2020-07-08 ENCOUNTER — Other Ambulatory Visit: Payer: Self-pay

## 2020-07-08 ENCOUNTER — Ambulatory Visit
Admission: RE | Admit: 2020-07-08 | Discharge: 2020-07-08 | Disposition: A | Discharge status: Home / Self Care | Payer: PPO | Source: Ambulatory Visit | Attending: Nephrology | Admitting: Nephrology

## 2020-07-08 DIAGNOSIS — R1032 Left lower quadrant pain: Secondary | ICD-10-CM | POA: Insufficient documentation

## 2020-07-08 DIAGNOSIS — N186 End stage renal disease: Secondary | ICD-10-CM | POA: Insufficient documentation

## 2020-07-08 DIAGNOSIS — K668 Other specified disorders of peritoneum: Secondary | ICD-10-CM | POA: Diagnosis not present

## 2020-07-08 DIAGNOSIS — Z992 Dependence on renal dialysis: Secondary | ICD-10-CM | POA: Diagnosis not present

## 2020-07-08 DIAGNOSIS — I7090 Unspecified atherosclerosis: Secondary | ICD-10-CM | POA: Diagnosis not present

## 2020-07-09 ENCOUNTER — Other Ambulatory Visit: Payer: Self-pay | Admitting: Nephrology

## 2020-07-09 DIAGNOSIS — Z992 Dependence on renal dialysis: Secondary | ICD-10-CM

## 2020-07-09 DIAGNOSIS — K3533 Acute appendicitis with perforation and localized peritonitis, with abscess: Secondary | ICD-10-CM

## 2020-07-14 ENCOUNTER — Ambulatory Visit
Admission: RE | Admit: 2020-07-14 | Discharge: 2020-07-14 | Disposition: A | Discharge status: Home / Self Care | Payer: PPO | Source: Ambulatory Visit | Attending: Nephrology | Admitting: Nephrology

## 2020-07-14 ENCOUNTER — Other Ambulatory Visit: Payer: Self-pay | Admitting: Nephrology

## 2020-07-14 ENCOUNTER — Other Ambulatory Visit: Payer: Self-pay

## 2020-07-14 DIAGNOSIS — I700 Atherosclerosis of aorta: Secondary | ICD-10-CM | POA: Diagnosis not present

## 2020-07-14 DIAGNOSIS — K668 Other specified disorders of peritoneum: Secondary | ICD-10-CM | POA: Insufficient documentation

## 2020-07-14 DIAGNOSIS — E1165 Type 2 diabetes mellitus with hyperglycemia: Secondary | ICD-10-CM | POA: Diagnosis not present

## 2020-07-14 DIAGNOSIS — K3189 Other diseases of stomach and duodenum: Secondary | ICD-10-CM | POA: Diagnosis not present

## 2020-07-14 DIAGNOSIS — R188 Other ascites: Secondary | ICD-10-CM | POA: Diagnosis not present

## 2020-07-15 DIAGNOSIS — N186 End stage renal disease: Secondary | ICD-10-CM | POA: Diagnosis not present

## 2020-07-15 DIAGNOSIS — Z992 Dependence on renal dialysis: Secondary | ICD-10-CM | POA: Diagnosis not present

## 2020-07-16 DIAGNOSIS — D509 Iron deficiency anemia, unspecified: Secondary | ICD-10-CM | POA: Diagnosis not present

## 2020-07-16 DIAGNOSIS — R82998 Other abnormal findings in urine: Secondary | ICD-10-CM | POA: Diagnosis not present

## 2020-07-16 DIAGNOSIS — K650 Generalized (acute) peritonitis: Secondary | ICD-10-CM | POA: Diagnosis not present

## 2020-07-16 DIAGNOSIS — N186 End stage renal disease: Secondary | ICD-10-CM | POA: Diagnosis not present

## 2020-07-16 DIAGNOSIS — K668 Other specified disorders of peritoneum: Secondary | ICD-10-CM | POA: Diagnosis not present

## 2020-07-16 DIAGNOSIS — E1129 Type 2 diabetes mellitus with other diabetic kidney complication: Secondary | ICD-10-CM | POA: Diagnosis not present

## 2020-07-16 DIAGNOSIS — Z992 Dependence on renal dialysis: Secondary | ICD-10-CM | POA: Diagnosis not present

## 2020-07-16 DIAGNOSIS — E875 Hyperkalemia: Secondary | ICD-10-CM | POA: Diagnosis not present

## 2020-07-16 DIAGNOSIS — N2581 Secondary hyperparathyroidism of renal origin: Secondary | ICD-10-CM | POA: Diagnosis not present

## 2020-07-16 DIAGNOSIS — N2589 Other disorders resulting from impaired renal tubular function: Secondary | ICD-10-CM | POA: Diagnosis not present

## 2020-07-16 DIAGNOSIS — E789 Disorder of lipoprotein metabolism, unspecified: Secondary | ICD-10-CM | POA: Diagnosis not present

## 2020-07-16 DIAGNOSIS — D631 Anemia in chronic kidney disease: Secondary | ICD-10-CM | POA: Diagnosis not present

## 2020-07-17 ENCOUNTER — Other Ambulatory Visit: Payer: Self-pay

## 2020-07-17 ENCOUNTER — Encounter
Admission: RE | Admit: 2020-07-17 | Discharge: 2020-07-17 | Disposition: A | Discharge status: Home / Self Care | Payer: PPO | Source: Ambulatory Visit | Attending: General Surgery | Admitting: General Surgery

## 2020-07-17 ENCOUNTER — Other Ambulatory Visit: Payer: Self-pay | Admitting: General Surgery

## 2020-07-17 DIAGNOSIS — R933 Abnormal findings on diagnostic imaging of other parts of digestive tract: Secondary | ICD-10-CM | POA: Diagnosis not present

## 2020-07-17 DIAGNOSIS — K668 Other specified disorders of peritoneum: Secondary | ICD-10-CM | POA: Diagnosis not present

## 2020-07-17 HISTORY — DX: Chronic obstructive pulmonary disease, unspecified: J44.9

## 2020-07-17 HISTORY — DX: Family history of other specified conditions: Z84.89

## 2020-07-17 NOTE — Patient Instructions (Signed)
Your procedure is scheduled on: Wednesday July 23, 2020. Report to Day Surgery inside [facility] 2nd floor. To find out your arrival time please call [phone] between 1PM - 3PM on Tuesday July 22, 2020.  Remember: Instructions that are not followed completely may result in serious medical risk,  up to and including death, or upon the discretion of your surgeon and anesthesiologist your  surgery may need to be rescheduled.     _X__ 1. Do not eat food after midnight the night before your procedure.                 No chewing gum or hard candies. You may drink clear liquids up to 2 hours                 before you are scheduled to arrive for your surgery- DO not drink clear                 liquids within 2 hours of the start of your surgery.                 Clear Liquids include:  water, apple juice without pulp, clear Gatorade, G2 or                  Gatorade Zero (avoid Red/Purple/Blue), Black Coffee or Tea (Do not add                 anything to coffee or tea).  __X__2.  On the morning of surgery brush your teeth with toothpaste and water, you                may rinse your mouth with mouthwash if you wish.  Do not swallow any toothpaste of mouthwash.     _X__ 3.  No Alcohol for 24 hours before or after surgery.   _X__ 4.  Do Not Smoke or use e-cigarettes For 24 Hours Prior to Your Surgery.                 Do not use any chewable tobacco products for at least 6 hours prior to                 Surgery.  _X__  5.  Do not use any recreational drugs (marijuana, cocaine, heroin, ecstasy, MDMA or other)                For at least one week prior to your surgery.  Combination of these drugs with anesthesia                May have life threatening results.  __X__6.  Notify your doctor if there is any change in your medical condition      (cold, fever, infections).     Do not wear jewelry, make-up, hairpins, clips or nail polish. Do not wear lotions,  powders, or perfumes. You may wear deodorant. Do not shave 48 hours prior to surgery. Men may shave face and neck. Do not bring valuables to the hospital.    [facility] is not responsible for any belongings or valuables.  Contacts, dentures or bridgework may not be worn into surgery. Leave your suitcase in the car. After surgery it may be brought to your room. For patients admitted to the hospital, discharge time is determined by your treatment team.   Patients discharged the day of surgery will not be allowed to drive home.   Make arrangements for someone to be with  you for the first 24 hours of your Same Day Discharge.    __X__ Take these medicines the morning of surgery with A SIP OF WATER:    1. carvedilol (COREG) 25   __X__ Use CHG Soap as directed  __X__ Use inhalers on the day of surgery  albuterol (PROVENTIL HFA) 108 (90 Base) MCG/ACT inhaler  budesonide-formoterol (SYMBICORT) 160-4.5 MCG/ACT inhaler  __X__ Take 1/2 of usual insulin dose the night before surgery. No insulin the morning          of surgery. insulin NPH (HUMULIN N,NOVOLIN N) 100 UNIT/ML  __X__ Stop Anti-inflammatories such as Ibuprofen, Aleve, Advil, naproxen, and or BC powders.    __X__ Stop supplements until after surgery.    __X__ Do not start any herbal supplements before your surgery.    If you have any questions regarding your pre-procedure instructions,  Please call Pre-admit Testing at [phone].

## 2020-07-18 ENCOUNTER — Encounter: Payer: Self-pay | Admitting: Urgent Care

## 2020-07-18 ENCOUNTER — Encounter
Admission: RE | Admit: 2020-07-18 | Discharge: 2020-07-18 | Disposition: A | Discharge status: Home / Self Care | Payer: PPO | Source: Ambulatory Visit | Attending: General Surgery | Admitting: General Surgery

## 2020-07-18 DIAGNOSIS — I10 Essential (primary) hypertension: Secondary | ICD-10-CM | POA: Diagnosis not present

## 2020-07-18 LAB — BASIC METABOLIC PANEL
Anion gap: 9 (ref 5–15)
BUN: 50 mg/dL — ABNORMAL HIGH (ref 8–23)
CO2: 30 mmol/L (ref 22–32)
Calcium: 8.2 mg/dL — ABNORMAL LOW (ref 8.9–10.3)
Chloride: 97 mmol/L — ABNORMAL LOW (ref 98–111)
Creatinine, Ser: 4.72 mg/dL — ABNORMAL HIGH (ref 0.44–1.00)
GFR calc Af Amer: 10 mL/min — ABNORMAL LOW (ref >60)
GFR calc non Af Amer: 8 mL/min — ABNORMAL LOW (ref >60)
Glucose, Bld: 288 mg/dL — ABNORMAL HIGH (ref 70–99)
Potassium: 3.5 mmol/L (ref 3.5–5.1)
Sodium: 136 mmol/L (ref 135–145)

## 2020-07-18 NOTE — H&P (Signed)
Subjective:     Patient ID: Andrea Stewart is a 74 y.o. female.  HPI  The following portions of the patient's history were reviewed and updated as appropriate.  This an established patient is here today for: office visit. Patient has been referred today by Dr. Abigail Butts for evaluation of a pneumoperitoneum. The patient is on peritoneal dialysis. She states she has had abdominal discomfort with movement since she started dialysis in June. She states that when the catheter is "draining" she has a "digging" (pulling) sensation in the abdomen. She admits to feeling bloated.  Some increased difficulty with breathing due to the pneumoperitoneum.  No shortness of breath at rest. Denies any nausea or vomiting. Bowels move 2-3 times  daily each morning, now watery in texture.  On rare occasion she will have an additional stool during the day.  She has not had a formed stool since beginning dialysis. She is here with her husband, Carloyn Manner.       Chief Complaint  Patient presents with  . Treatment Plan Discussion    pneumoperitoneum     BP 124/70   Pulse 71   Temp 36.2 C (97.1 F)   Ht 154.9 cm (5\' 1" )   Wt 80.3 kg (177 lb)   SpO2 97%   BMI 33.44 kg/m       Past Medical History:  Diagnosis Date  . Acquired absence of kidney   . Arthritis   . Atherosclerosis of coronary artery of native heart   . Chronic kidney disease   . COPD (chronic obstructive pulmonary disease) (CMS-HCC)   . Depression   . Disorder of bone and cartilage   . DM (diabetes mellitus) (CMS-HCC)    Type II  . ESRD (end stage renal disease) (CMS-HCC) 01/04/2020   Peritoneal dialysis, nephrology  . Hereditary and idiopathic neuropathy   . Hyperlipidemia   . Hypertension   . Irritable bowel syndrome   . Mass in chest   . Myalgia   . Obesity   . Personal history of diseases of the blood and blood-forming organs and certain disorders involving the immune mechanism   . Plantar fascial  fibromatosis   . Thyroid disorder   . Tremor           Past Surgical History:  Procedure Laterality Date  . APPENDECTOMY    . back fusion surgery    . cardiac cath    . CARPAL TUNNEL RELEASE    . cervical     . CHOLECYSTECTOMY    . COLONOSCOPY  04/18/2012, 11/02/2006, 09/26/1999, 02/23/1989   [facility] (Mother): CBF 04/2017; Recall Ltr mailed 03/23/2017 (dw)  . EGD  09/05/2013, 02/23/1989  . FLEXIBLE SIGMOIDOSCOPY  10/20/1995  . foot surgeries    . hand surgeries    . HYSTERECTOMY     partial  . left elbow    . OOPHORECTOMY    . OTHER SURGERY     Catheter palcement for dialysis  . 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 REPAIR INCISIONAL/VENTRAL HERNIA  09/08/2017  . REPLACEMENT TOTAL KNEE BILATERAL    . SPINE SURGERY    . VESICOVAGINAL FISTULA CLOSURE W/ TAH    . video bronchoscopy Bilateral 04/02/2015              OB History    Gravida  4   Para  4   Term      Preterm      AB  Living        SAB      TAB      Ectopic      Molar      Multiple      Live Births          Obstetric Comments  Age at first period 31 Age of first pregnancy 19         Social History          Socioeconomic History  . Marital status: Married    Spouse name: Not on file  . Number of children: Not on file  . Years of education: Not on file  . Highest education level: Not on file  Occupational History  . Not on file  Tobacco Use  . Smoking status: Current Every Day Smoker    Packs/day: 0.25    Types: Cigarettes  . Smokeless tobacco: Never Used  Vaping Use  . Vaping Use: Never used  Substance and Sexual Activity  . Alcohol use: No  . Drug use: No  . Sexual activity: Not on file  Other Topics Concern  . Not on file  Social History Narrative  . Not on file   Social Determinants of Health      Financial Resource Strain:   . Difficulty of Paying Living Expenses:   Food  Insecurity:   . Worried About Charity fundraiser in the Last Year:   . Arboriculturist in the Last Year:   Transportation Needs:   . Film/video editor (Medical):   Marland Kitchen Lack of Transportation (Non-Medical):             Allergies  Allergen Reactions  . Gabapentin Other (See Comments)    intolerance "makes me crazy"  . Morphine Anaphylaxis and Shortness Of Breath  . Nsaids (Non-Steroidal Anti-Inflammatory Drug) Diarrhea    Profuse diarrhea  . Penicillins Other (See Comments) and Swelling    REACTION: rash, swelling. Remote reaction as a child to [place]. Patient did tolerate recent course of augmentin   . Prednisone Other (See Comments)    "makes me think I am having a heart attack" "makes me think I am having a heart attack"  . Hydralazine Itching  . Minoxidil Swelling    And Shortness of breath    Current Medications        Current Outpatient Medications  Medication Sig Dispense Refill  . albuterol 90 mcg/actuation inhaler Inhale into the lungs.    Marland Kitchen ascorbic acid, vitamin C, (VITAMIN C) 1000 MG tablet Take by mouth    . aspirin 81 MG EC tablet Take 81 mg by mouth once daily       . atorvastatin (LIPITOR) 20 MG tablet Take 1 tablet (20 mg total) by mouth once daily 90 tablet 3  . budesonide (PULMICORT) 0.25 mg/2 mL nebulizer solution Take 2 mLs (0.25 mg total) by nebulization once daily Per patient, PRN 60 mL 11  . budesonide-formoterol (SYMBICORT) 160-4.5 mcg/actuation inhaler Inhale into the lungs.    Marland Kitchen CALCIUM CARBONATE ORAL Take by mouth once daily 500mg  with vitamin d      . carvediloL (COREG) 25 MG tablet Take 1 tablet (25 mg total) by mouth 2 (two) times daily with meals 60 tablet 11  . cholecalciferol (VITAMIN D3) 2,000 unit tablet Take 2,000 Units by mouth once daily.    . cloNIDine HCL (CATAPRES) 0.1 MG tablet 1 tablet (0.1 mg total) nightly    . co-enzyme Q-10, ubiquinone,  30 mg capsule Take by mouth    . cyclobenzaprine (FLEXERIL)  10 MG tablet     . fluticasone (VERAMYST) 27.5 mcg/actuation nasal spray by Nasal route.    . FUROsemide (LASIX) 80 MG tablet Take 80 mg by mouth once daily    . hydrOXYzine (ATARAX) 25 MG tablet TAKE 1 TABLET BY MOUTH 3 TIMES A DAY AS NEEDED FOR ITCHING 60 tablet 11  . insulin LISPRO (HUMALOG) injection Inject subcutaneously 3 (three) times daily with meals. SSI     . insulin NPH (HUMULIN N) 100 unit/mL injection Inject 20 Units subcutaneously every morning 30 units every PM      . insulin regular, human (HUMULIN R REGULAR U-100 INSULN INJ) Inject as directed Sliding scale    . irbesartan (AVAPRO) 150 MG tablet Take 1 tablet (150 mg total) by mouth 2 (two) times daily    . L.acid/B.bifidum/B.animal/FOS (PROBIOTIC COMPLEX ORAL) Take by mouth    . magnesium oxide 500 mg Cap Take by mouth    . methocarbamol (ROBAXIN) 500 MG tablet Take 500 mg by mouth every 6 (six) hours as needed.    . multivitamin tablet Take by mouth once daily       . nitroGLYcerin (NITROSTAT) 0.4 MG SL tablet Place 0.4 mg under the tongue every 5 (five) minutes as needed for Chest pain. May take up to 3 doses.    . traMADol (ULTRAM) 50 mg tablet Take 50 mg by mouth every 6 (six) hours as needed       . zinc gluconate 50 mg tablet Take by mouth     No current facility-administered medications for this visit.           Family History  Problem Relation Age of Onset  . Colon cancer Mother   . Cancer Mother   . High blood pressure (Hypertension) Mother   . Heart failure Mother   . Coronary Artery Disease (Blocked arteries around heart) Mother   . Diabetes Mother   . Emphysema Father   . Prostate cancer Father   . COPD Father   . Myocardial Infarction (Heart attack) Brother   . Diabetes Brother   . Sleep apnea Brother   . Hypothyroidism Sister   . High blood pressure (Hypertension) Brother   . Diabetes Paternal Aunt   . Diabetes Paternal Uncle   . Cancer Daughter         brain, lungs, liver, spine, kidney     Review of Systems  Constitutional: Negative for chills and fever.  Respiratory: Negative for choking.   Gastrointestinal: Positive for abdominal distention and abdominal pain. Negative for nausea and vomiting.       Objective:   Physical Exam Constitutional:      Appearance: Normal appearance.  Cardiovascular:     Rate and Rhythm: Normal rate and regular rhythm.     Pulses: Normal pulses.          Femoral pulses are 2+ on the right side and 2+ on the left side.    Heart sounds: Normal heart sounds.  Pulmonary:     Effort: Pulmonary effort is normal.     Breath sounds: Normal breath sounds.  Abdominal:     General: Bowel sounds are normal. There is distension.     Palpations: Abdomen is soft.     Hernia: No hernia is present.       Comments: Peritoneal dialysis catheter   Musculoskeletal:     Cervical back: Neck supple.  Right lower leg: No edema.     Left lower leg: No edema.  Skin:    General: Skin is warm and dry.  Neurological:     Mental Status: She is alert and oriented to person, place, and time.  Psychiatric:        Mood and Affect: Mood normal.        Behavior: Behavior normal.    Labs and Radiology:   Mar 25, 2020 surgical note for placement of her peritoneal dialysis catheter was reviewed.  This describes laparoscopy with lysis of adhesions of the omentum to the anterior abdominal wall, fixation of the omentum to the right of the midline and placement of the peritoneal dialysis catheter into the pelvis.  The physician described a periumbilical port site with advancement of the catheter along the anterior abdominal wall and then entering the abdomen actually at the level of the pelvis.  By description it easily irrigated and drained.  Exit site was in the right upper quadrant.  Plain films of the abdomen obtained July 08, 2020 showed significant pneumoperitoneum.  These films were independently  reviewed.  The CT scan of the abdomen and pelvis dated July 14, 2020 was also independently reviewed.  Pneumoperitoneum remained present. IMPRESSION: 1. Large amount of pneumoperitoneum, much greater than expected from peritoneal dialysis catheter and bowel perforation is not entirely excluded. Apparent circumferential wall thickening of the pylorus/proximal duodenum with possible ulceration but no extravasated oral contrast identified. It is uncertain the significance of the distal gastric/proximal duodenal finding given that this pain has occurred over the last 2 months and only during peritoneal dialysis. Direct inspection may be helpful for further evaluation. 2. 8 mm and 4 mm ground-glass opacities within the LEFT LOWER lobe, most likely inflammatory infectious. Initial follow-up with CT at 6-12 months is recommended to confirm persistence  On review of these films, contrast did not reach the colon.  No dilatation of the small bowel was seen, no significant mesenteric edema noted.  The small bowel appears to be intimately associated with the dialysis catheter.  The patient had a CBC on June 18, 2020 with her nephrologist.  This showed a hemoglobin of 11.2 with an MCV of 102.  White blood cell count 8500, platelet count 212,000.  3 weeks earlier the hemoglobin was 12.3.  BUN and creatinine July 10, 2020 showed a creatinine of 4.74, up approximately 1.5 over the preceding measurements with a BUN of 50.  CBC dated July 16, 2020 showed a hemoglobin of 11.2, white blood cell count 9400 with normal differential.  Platelet count of 230,000.  Plain films of the abdomen of the same date showed persistent pneumoperitoneum.      Assessment:     Pneumoperitoneum without clear etiology.    Plan:     The case has been discussed both prior to and after the patient's visit.  By report, nursing observance of her technique for catheter hookup is appropriate.  The patient has been  symptomatic now for weeks, is afebrile and has a normal white count.  I do not think were dealing with perforation of the lower GI tract.  The patient's new report of loose stools since initiating home peritoneal dialysis raises a question of whether there may have a been erosion into the small bowel with containment but migration of the dialysis fluid into the intestinal tract.  The findings in the duodenum, second portion are quite intriguing.  The patient did not have a history of peptic ulcer disease.  She has not had a prior upper endoscopy.  Colonoscopy April 18, 2012 was normal.    As I will be out of town this weekend, I cannot do a procedure requiring hospitalization.  I offered her referral back to the physician who did her peritoneal dialysis catheter placement or another local physician.  She declined.  We will plan for a diagnostic laparoscopy, with the possibility of laparotomy.  Upper endoscopy will be completed at the same time.  If the catheter has eroded the bowel, we will plan on bowel repair, preservation of the catheter and treatment with antibiotics as for someone with bacterial peritonitis.  Depending upon the findings in the duodenum, which I think is unlikely will represent a perforated ulcer, other avenues of intervention may be required.  She is aware that an open procedure may be necessary.   Entered by Ledell Noss, CMA, acting as a scribe for Dr. Hervey Ard, MD.   The documentation recorded by the scribe accurately reflects the service I personally performed and the decisions made by me.   Robert Bellow, MD FACS

## 2020-07-22 ENCOUNTER — Other Ambulatory Visit: Payer: Self-pay

## 2020-07-22 ENCOUNTER — Other Ambulatory Visit
Admission: RE | Admit: 2020-07-22 | Discharge: 2020-07-22 | Disposition: A | Discharge status: Home / Self Care | Payer: PPO | Source: Ambulatory Visit | Attending: General Surgery | Admitting: General Surgery

## 2020-07-22 DIAGNOSIS — Z20822 Contact with and (suspected) exposure to covid-19: Secondary | ICD-10-CM | POA: Diagnosis not present

## 2020-07-22 DIAGNOSIS — Z01812 Encounter for preprocedural laboratory examination: Secondary | ICD-10-CM | POA: Diagnosis not present

## 2020-07-23 ENCOUNTER — Ambulatory Visit: Payer: PPO | Admitting: Anesthesiology

## 2020-07-23 ENCOUNTER — Encounter
Admission: RE | Disposition: A | Discharge status: Home / Self Care | Payer: Self-pay | Source: Home / Self Care | Attending: General Surgery

## 2020-07-23 ENCOUNTER — Ambulatory Visit
Admission: RE | Admit: 2020-07-23 | Discharge: 2020-07-23 | Disposition: A | Discharge status: Home / Self Care | Payer: PPO | Attending: General Surgery | Admitting: General Surgery

## 2020-07-23 ENCOUNTER — Encounter: Payer: Self-pay | Admitting: General Surgery

## 2020-07-23 DIAGNOSIS — Z79899 Other long term (current) drug therapy: Secondary | ICD-10-CM | POA: Diagnosis not present

## 2020-07-23 DIAGNOSIS — Z7951 Long term (current) use of inhaled steroids: Secondary | ICD-10-CM | POA: Diagnosis not present

## 2020-07-23 DIAGNOSIS — Z885 Allergy status to narcotic agent status: Secondary | ICD-10-CM | POA: Insufficient documentation

## 2020-07-23 DIAGNOSIS — Z880 Allergy status to penicillin: Secondary | ICD-10-CM | POA: Diagnosis not present

## 2020-07-23 DIAGNOSIS — Z794 Long term (current) use of insulin: Secondary | ICD-10-CM | POA: Insufficient documentation

## 2020-07-23 DIAGNOSIS — N183 Chronic kidney disease, stage 3 unspecified: Secondary | ICD-10-CM | POA: Diagnosis not present

## 2020-07-23 DIAGNOSIS — E785 Hyperlipidemia, unspecified: Secondary | ICD-10-CM | POA: Insufficient documentation

## 2020-07-23 DIAGNOSIS — Z881 Allergy status to other antibiotic agents status: Secondary | ICD-10-CM | POA: Insufficient documentation

## 2020-07-23 DIAGNOSIS — Z886 Allergy status to analgesic agent status: Secondary | ICD-10-CM | POA: Insufficient documentation

## 2020-07-23 DIAGNOSIS — I120 Hypertensive chronic kidney disease with stage 5 chronic kidney disease or end stage renal disease: Secondary | ICD-10-CM | POA: Insufficient documentation

## 2020-07-23 DIAGNOSIS — Z833 Family history of diabetes mellitus: Secondary | ICD-10-CM | POA: Insufficient documentation

## 2020-07-23 DIAGNOSIS — M1990 Unspecified osteoarthritis, unspecified site: Secondary | ICD-10-CM | POA: Insufficient documentation

## 2020-07-23 DIAGNOSIS — E119 Type 2 diabetes mellitus without complications: Secondary | ICD-10-CM | POA: Diagnosis not present

## 2020-07-23 DIAGNOSIS — I2510 Atherosclerotic heart disease of native coronary artery without angina pectoris: Secondary | ICD-10-CM | POA: Insufficient documentation

## 2020-07-23 DIAGNOSIS — E1140 Type 2 diabetes mellitus with diabetic neuropathy, unspecified: Secondary | ICD-10-CM | POA: Insufficient documentation

## 2020-07-23 DIAGNOSIS — J449 Chronic obstructive pulmonary disease, unspecified: Secondary | ICD-10-CM | POA: Diagnosis not present

## 2020-07-23 DIAGNOSIS — Z524 Kidney donor: Secondary | ICD-10-CM | POA: Diagnosis not present

## 2020-07-23 DIAGNOSIS — R933 Abnormal findings on diagnostic imaging of other parts of digestive tract: Secondary | ICD-10-CM | POA: Diagnosis not present

## 2020-07-23 DIAGNOSIS — F17210 Nicotine dependence, cigarettes, uncomplicated: Secondary | ICD-10-CM | POA: Diagnosis not present

## 2020-07-23 DIAGNOSIS — Z8249 Family history of ischemic heart disease and other diseases of the circulatory system: Secondary | ICD-10-CM | POA: Insufficient documentation

## 2020-07-23 DIAGNOSIS — Z888 Allergy status to other drugs, medicaments and biological substances status: Secondary | ICD-10-CM | POA: Insufficient documentation

## 2020-07-23 DIAGNOSIS — I129 Hypertensive chronic kidney disease with stage 1 through stage 4 chronic kidney disease, or unspecified chronic kidney disease: Secondary | ICD-10-CM | POA: Diagnosis not present

## 2020-07-23 DIAGNOSIS — Z9049 Acquired absence of other specified parts of digestive tract: Secondary | ICD-10-CM | POA: Insufficient documentation

## 2020-07-23 DIAGNOSIS — M8580 Other specified disorders of bone density and structure, unspecified site: Secondary | ICD-10-CM | POA: Insufficient documentation

## 2020-07-23 DIAGNOSIS — Z7982 Long term (current) use of aspirin: Secondary | ICD-10-CM | POA: Insufficient documentation

## 2020-07-23 DIAGNOSIS — K668 Other specified disorders of peritoneum: Secondary | ICD-10-CM | POA: Insufficient documentation

## 2020-07-23 DIAGNOSIS — E669 Obesity, unspecified: Secondary | ICD-10-CM | POA: Diagnosis not present

## 2020-07-23 DIAGNOSIS — Z992 Dependence on renal dialysis: Secondary | ICD-10-CM | POA: Insufficient documentation

## 2020-07-23 DIAGNOSIS — Z905 Acquired absence of kidney: Secondary | ICD-10-CM | POA: Diagnosis not present

## 2020-07-23 DIAGNOSIS — R935 Abnormal findings on diagnostic imaging of other abdominal regions, including retroperitoneum: Secondary | ICD-10-CM | POA: Diagnosis not present

## 2020-07-23 DIAGNOSIS — N186 End stage renal disease: Secondary | ICD-10-CM | POA: Diagnosis not present

## 2020-07-23 DIAGNOSIS — E1122 Type 2 diabetes mellitus with diabetic chronic kidney disease: Secondary | ICD-10-CM | POA: Diagnosis not present

## 2020-07-23 DIAGNOSIS — Z96653 Presence of artificial knee joint, bilateral: Secondary | ICD-10-CM | POA: Insufficient documentation

## 2020-07-23 HISTORY — PX: LAPAROSCOPIC ABDOMINAL EXPLORATION: SHX6249

## 2020-07-23 HISTORY — PX: ESOPHAGOGASTRODUODENOSCOPY (EGD) WITH PROPOFOL: SHX5813

## 2020-07-23 HISTORY — PX: UPPER GI ENDOSCOPY: SHX6162

## 2020-07-23 LAB — SARS CORONAVIRUS 2 (TAT 6-24 HRS): SARS Coronavirus 2: NEGATIVE

## 2020-07-23 LAB — GLUCOSE, CAPILLARY
Glucose-Capillary: 316 mg/dL — ABNORMAL HIGH (ref 70–99)
Glucose-Capillary: 312 mg/dL — ABNORMAL HIGH (ref 70–99)
Glucose-Capillary: 182 mg/dL — ABNORMAL HIGH (ref 70–99)

## 2020-07-23 LAB — POCT I-STAT, CHEM 8
BUN: 44 mg/dL — ABNORMAL HIGH (ref 8–23)
Calcium, Ion: 1.13 mmol/L — ABNORMAL LOW (ref 1.15–1.40)
Chloride: 97 mmol/L — ABNORMAL LOW (ref 98–111)
Creatinine, Ser: 5.1 mg/dL — ABNORMAL HIGH (ref 0.44–1.00)
Glucose, Bld: 413 mg/dL — ABNORMAL HIGH (ref 70–99)
HCT: 35 % — ABNORMAL LOW (ref 36.0–46.0)
Hemoglobin: 11.9 g/dL — ABNORMAL LOW (ref 12.0–15.0)
Potassium: 3.9 mmol/L (ref 3.5–5.1)
Sodium: 135 mmol/L (ref 135–145)
TCO2: 28 mmol/L (ref 22–32)

## 2020-07-23 SURGERY — ESOPHAGOGASTRODUODENOSCOPY (EGD) WITH PROPOFOL
Anesthesia: General

## 2020-07-23 SURGERY — EXPLORATION, ABDOMEN, LAPAROSCOPIC
Anesthesia: General | Site: Abdomen

## 2020-07-23 MED ORDER — SUCCINYLCHOLINE CHLORIDE 20 MG/ML IJ SOLN
INTRAMUSCULAR | Status: DC | PRN
  Administered 2020-07-23: 140 mg via INTRAVENOUS

## 2020-07-23 MED ORDER — EPHEDRINE 5 MG/ML INJ
INTRAVENOUS | Status: AC
  Filled 2020-07-23: qty 10

## 2020-07-23 MED ORDER — ORAL CARE MOUTH RINSE: 15.0000 mL | Freq: Once | OROMUCOSAL | Status: DC

## 2020-07-23 MED ORDER — INSULIN ASPART 100 UNIT/ML ~~LOC~~ SOLN
SUBCUTANEOUS | Status: AC
  Administered 2020-07-23: 10 [IU] via SUBCUTANEOUS
  Filled 2020-07-23: qty 1

## 2020-07-23 MED ORDER — ACETAMINOPHEN 10 MG/ML IV SOLN
INTRAVENOUS | Status: DC | PRN
  Administered 2020-07-23: 1000 mg via INTRAVENOUS

## 2020-07-23 MED ORDER — FAMOTIDINE 20 MG PO TABS
ORAL_TABLET | ORAL | Status: AC
  Administered 2020-07-23: 20 mg via ORAL
  Filled 2020-07-23: qty 1

## 2020-07-23 MED ORDER — CHLORHEXIDINE GLUCONATE 0.12 % MT SOLN
OROMUCOSAL | Status: AC
  Filled 2020-07-23: qty 15

## 2020-07-23 MED ORDER — CHLORHEXIDINE GLUCONATE CLOTH 2 % EX PADS: 6.0000 | MEDICATED_PAD | Freq: Once | CUTANEOUS | Status: DC

## 2020-07-23 MED ORDER — SODIUM CHLORIDE 0.9 % IV SOLN: INTRAVENOUS | Status: DC

## 2020-07-23 MED ORDER — INSULIN ASPART 100 UNIT/ML ~~LOC~~ SOLN
SUBCUTANEOUS | Status: AC
  Administered 2020-07-23: 8 [IU] via SUBCUTANEOUS
  Filled 2020-07-23: qty 1

## 2020-07-23 MED ORDER — MIDAZOLAM HCL 2 MG/2ML IJ SOLN
INTRAMUSCULAR | Status: AC
  Filled 2020-07-23: qty 2

## 2020-07-23 MED ORDER — FENTANYL CITRATE (PF) 100 MCG/2ML IJ SOLN: 25.0000 ug | INTRAMUSCULAR | Status: DC | PRN

## 2020-07-23 MED ORDER — ONDANSETRON HCL 4 MG/2ML IJ SOLN
INTRAMUSCULAR | Status: AC
  Filled 2020-07-23: qty 2

## 2020-07-23 MED ORDER — EPHEDRINE SULFATE 50 MG/ML IJ SOLN
INTRAMUSCULAR | Status: DC | PRN
  Administered 2020-07-23 (×3): 10 mg via INTRAVENOUS

## 2020-07-23 MED ORDER — CEFAZOLIN SODIUM-DEXTROSE 2-4 GM/100ML-% IV SOLN
INTRAVENOUS | Status: AC
  Filled 2020-07-23: qty 100

## 2020-07-23 MED ORDER — SUGAMMADEX SODIUM 200 MG/2ML IV SOLN
INTRAVENOUS | Status: DC | PRN
  Administered 2020-07-23: 200 mg via INTRAVENOUS

## 2020-07-23 MED ORDER — ACETAMINOPHEN 10 MG/ML IV SOLN
INTRAVENOUS | Status: AC
  Filled 2020-07-23: qty 100

## 2020-07-23 MED ORDER — INSULIN ASPART 100 UNIT/ML ~~LOC~~ SOLN: 10.0000 [IU] | Freq: Once | SUBCUTANEOUS | Status: AC

## 2020-07-23 MED ORDER — BUPIVACAINE-EPINEPHRINE (PF) 0.5% -1:200000 IJ SOLN
INTRAMUSCULAR | Status: AC
  Filled 2020-07-23: qty 30

## 2020-07-23 MED ORDER — ONDANSETRON HCL 4 MG/2ML IJ SOLN: 4.0000 mg | Freq: Once | INTRAMUSCULAR | Status: DC | PRN

## 2020-07-23 MED ORDER — MIDAZOLAM HCL 2 MG/2ML IJ SOLN
INTRAMUSCULAR | Status: DC | PRN
  Administered 2020-07-23: 1 mg via INTRAVENOUS

## 2020-07-23 MED ORDER — FAMOTIDINE 20 MG PO TABS: 20.0000 mg | ORAL_TABLET | Freq: Once | ORAL | Status: AC

## 2020-07-23 MED ORDER — SODIUM CHLORIDE 0.9 % IV SOLN
INTRAVENOUS | Status: DC | PRN
  Administered 2020-07-23: 30 ug/min via INTRAVENOUS

## 2020-07-23 MED ORDER — LIDOCAINE HCL (CARDIAC) PF 100 MG/5ML IV SOSY
PREFILLED_SYRINGE | INTRAVENOUS | Status: DC | PRN
  Administered 2020-07-23: 100 mg via INTRAVENOUS

## 2020-07-23 MED ORDER — LIDOCAINE HCL (PF) 2 % IJ SOLN
INTRAMUSCULAR | Status: AC
  Filled 2020-07-23: qty 5

## 2020-07-23 MED ORDER — ACETAMINOPHEN 10 MG/ML IV SOLN: 1000.0000 mg | Freq: Once | INTRAVENOUS | Status: DC | PRN

## 2020-07-23 MED ORDER — LIDOCAINE HCL 4 % MT SOLN
OROMUCOSAL | Status: DC | PRN
  Administered 2020-07-23: 4 mL via TOPICAL

## 2020-07-23 MED ORDER — PROPOFOL 10 MG/ML IV BOLUS
INTRAVENOUS | Status: DC | PRN
  Administered 2020-07-23: 150 mg via INTRAVENOUS

## 2020-07-23 MED ORDER — CHLORHEXIDINE GLUCONATE 0.12 % MT SOLN: 15.0000 mL | Freq: Once | OROMUCOSAL | Status: DC

## 2020-07-23 MED ORDER — PROPOFOL 10 MG/ML IV BOLUS
INTRAVENOUS | Status: AC
  Filled 2020-07-23: qty 20

## 2020-07-23 MED ORDER — ONDANSETRON HCL 4 MG/2ML IJ SOLN
INTRAMUSCULAR | Status: DC | PRN
  Administered 2020-07-23: 4 mg via INTRAVENOUS

## 2020-07-23 MED ORDER — FENTANYL CITRATE (PF) 250 MCG/5ML IJ SOLN
INTRAMUSCULAR | Status: DC | PRN
Start: 2020-07-23 — End: 2020-07-23
  Administered 2020-07-23 (×2): 50 ug via INTRAVENOUS

## 2020-07-23 MED ORDER — ROCURONIUM BROMIDE 100 MG/10ML IV SOLN
INTRAVENOUS | Status: DC | PRN
  Administered 2020-07-23: 40 mg via INTRAVENOUS
  Administered 2020-07-23: 10 mg via INTRAVENOUS

## 2020-07-23 MED ORDER — FENTANYL CITRATE (PF) 250 MCG/5ML IJ SOLN
INTRAMUSCULAR | Status: AC
  Filled 2020-07-23: qty 5

## 2020-07-23 MED ORDER — LACTATED RINGERS IV SOLN: INTRAVENOUS | Status: DC | PRN

## 2020-07-23 MED ORDER — ROCURONIUM BROMIDE 10 MG/ML (PF) SYRINGE
PREFILLED_SYRINGE | INTRAVENOUS | Status: AC
  Filled 2020-07-23: qty 10

## 2020-07-23 MED ORDER — SUCCINYLCHOLINE CHLORIDE 200 MG/10ML IV SOSY
PREFILLED_SYRINGE | INTRAVENOUS | Status: AC
  Filled 2020-07-23: qty 10

## 2020-07-23 MED ORDER — DEXAMETHASONE SODIUM PHOSPHATE 10 MG/ML IJ SOLN
INTRAMUSCULAR | Status: AC
  Filled 2020-07-23: qty 1

## 2020-07-23 MED ORDER — INSULIN ASPART 100 UNIT/ML ~~LOC~~ SOLN
SUBCUTANEOUS | Status: DC | PRN
  Administered 2020-07-23: 10 [IU] via SUBCUTANEOUS

## 2020-07-23 MED ORDER — INSULIN ASPART 100 UNIT/ML ~~LOC~~ SOLN
SUBCUTANEOUS | Status: AC
  Filled 2020-07-23: qty 1

## 2020-07-23 MED ORDER — PHENYLEPHRINE HCL (PRESSORS) 10 MG/ML IV SOLN
INTRAVENOUS | Status: DC | PRN
  Administered 2020-07-23 (×2): 100 ug via INTRAVENOUS

## 2020-07-23 MED ORDER — INSULIN ASPART 100 UNIT/ML ~~LOC~~ SOLN: 10.0000 [IU] | Freq: Once | SUBCUTANEOUS | Status: DC

## 2020-07-23 MED ORDER — INSULIN ASPART 100 UNIT/ML ~~LOC~~ SOLN: 8.0000 [IU] | Freq: Once | SUBCUTANEOUS | Status: AC

## 2020-07-23 MED ORDER — CEFAZOLIN SODIUM-DEXTROSE 2-4 GM/100ML-% IV SOLN
2.0000 g | INTRAVENOUS | Status: AC
  Administered 2020-07-23: 2 g via INTRAVENOUS

## 2020-07-23 SURGICAL SUPPLY — 58 items
APL PRP STRL LF DISP 70% ISPRP (MISCELLANEOUS) ×2
BAG COUNTER SPONGE EZ (MISCELLANEOUS) IMPLANT
BAG SPNG 4X4 CLR HAZ (MISCELLANEOUS)
BLADE SURG 11 STRL SS SAFETY (MISCELLANEOUS) ×4 IMPLANT
BULB RESERV EVAC DRAIN JP 100C (MISCELLANEOUS) ×4 IMPLANT
CANISTER SUCT 1200ML W/VALVE (MISCELLANEOUS) ×4 IMPLANT
CANNULA DILATOR  5MM W/SLV (CANNULA)
CANNULA DILATOR 10 W/SLV (CANNULA) ×3 IMPLANT
CANNULA DILATOR 10MM W/SLV (CANNULA) ×1
CANNULA DILATOR 5 W/SLV (CANNULA) ×1 IMPLANT
CHLORAPREP W/TINT 26 (MISCELLANEOUS) ×4 IMPLANT
CLOSURE WOUND 1/2 X4 (GAUZE/BANDAGES/DRESSINGS) ×1
COUNTER SPONGE BAG EZ (MISCELLANEOUS)
COVER CLAMP SIL LG PBX B (MISCELLANEOUS) ×4 IMPLANT
COVER WAND RF STERILE (DRAPES) ×4 IMPLANT
DRAIN CHANNEL JP 15F RND 16 (MISCELLANEOUS) ×4 IMPLANT
DRAPE LAPAROTOMY 100X77 ABD (DRAPES) ×4 IMPLANT
DRSG TEGADERM 2-3/8X2-3/4 SM (GAUZE/BANDAGES/DRESSINGS) ×12 IMPLANT
DRSG TELFA 3X8 NADH (GAUZE/BANDAGES/DRESSINGS) ×4 IMPLANT
DRSG TELFA 4X3 1S NADH ST (GAUZE/BANDAGES/DRESSINGS) ×4 IMPLANT
ELECT BLADE 6 FLAT ULTRCLN (ELECTRODE) ×4 IMPLANT
ELECT REM PT RETURN 9FT ADLT (ELECTROSURGICAL) ×4
ELECTRODE REM PT RTRN 9FT ADLT (ELECTROSURGICAL) ×2 IMPLANT
GAUZE SPONGE 4X4 12PLY STRL (GAUZE/BANDAGES/DRESSINGS) ×4 IMPLANT
GLOVE BIO SURGEON STRL SZ7.5 (GLOVE) ×4 IMPLANT
GLOVE INDICATOR 8.0 STRL GRN (GLOVE) ×4 IMPLANT
GOWN STRL REUS W/ TWL LRG LVL3 (GOWN DISPOSABLE) ×4 IMPLANT
GOWN STRL REUS W/TWL LRG LVL3 (GOWN DISPOSABLE) ×8
GRASPER SUT TROCAR 14GX15 (MISCELLANEOUS) ×3 IMPLANT
HOLDER FOLEY CATH W/STRAP (MISCELLANEOUS) ×4 IMPLANT
IRRIGATION STRYKERFLOW (MISCELLANEOUS) ×1 IMPLANT
IRRIGATOR STRYKERFLOW (MISCELLANEOUS) ×4
KIT TURNOVER KIT A (KITS) ×4 IMPLANT
LABEL OR SOLS (LABEL) ×4 IMPLANT
NS IRRIG 1000ML POUR BTL (IV SOLUTION) ×4 IMPLANT
NS IRRIG 500ML POUR BTL (IV SOLUTION) ×4 IMPLANT
PACK BASIN MAJOR ARMC (MISCELLANEOUS) ×4 IMPLANT
PACK LAP CHOLECYSTECTOMY (MISCELLANEOUS) ×4 IMPLANT
PAD DRESSING TELFA 3X8 NADH (GAUZE/BANDAGES/DRESSINGS) ×1 IMPLANT
POUCH ENDO CATCH 10MM SPEC (MISCELLANEOUS) IMPLANT
SET TUBE SMOKE EVAC HIGH FLOW (TUBING) ×4 IMPLANT
SET YANKAUER POOLE SUCT (MISCELLANEOUS) ×4 IMPLANT
SPONGE LAP 18X18 RF (DISPOSABLE) ×4 IMPLANT
STAPLER SKIN PROX 35W (STAPLE) ×4 IMPLANT
STRIP CLOSURE SKIN 1/2X4 (GAUZE/BANDAGES/DRESSINGS) ×3 IMPLANT
SUT ETH BLK MONO 3 0 FS 1 12/B (SUTURE) ×4 IMPLANT
SUT MAXON 0 T 12 3 (SUTURE) ×3 IMPLANT
SUT PROLENE 0 CT 1 30 (SUTURE) ×16 IMPLANT
SUT SILK 3-0 (SUTURE) ×4 IMPLANT
SUT VIC AB 0 SH 27 (SUTURE) ×4 IMPLANT
SUT VIC AB 2-0 BRD 54 (SUTURE) ×4 IMPLANT
SUT VIC AB 3-0 SH 27 (SUTURE) ×8
SUT VIC AB 3-0 SH 27X BRD (SUTURE) ×4 IMPLANT
SUT VIC AB 4-0 FS2 27 (SUTURE) ×4 IMPLANT
SUT VICRYL 3-0 SH-1 18IN (SUTURE) ×4 IMPLANT
SWABSTK COMLB BENZOIN TINCTURE (MISCELLANEOUS) ×4 IMPLANT
TRAY FOLEY MTR SLVR 16FR STAT (SET/KITS/TRAYS/PACK) ×4 IMPLANT
TROCAR XCEL NON-BLD 11X100MML (ENDOMECHANICALS) ×3 IMPLANT

## 2020-07-23 NOTE — Anesthesia Procedure Notes (Signed)
Procedure Name: Intubation Date/Time: 07/23/2020 12:17 PM Performed by: Eben Burow, CRNA Pre-anesthesia Checklist: Patient identified, Emergency Drugs available, Suction available and Patient being monitored Patient Re-evaluated:Patient Re-evaluated prior to induction Oxygen Delivery Method: Circle system utilized Preoxygenation: Pre-oxygenation with 100% oxygen Induction Type: IV induction and Rapid sequence Laryngoscope Size: McGraph and 3 Grade View: Grade I Tube type: Oral Tube size: 7.0 mm Number of attempts: 1 Airway Equipment and Method: Stylet,  Video-laryngoscopy and LTA kit utilized Placement Confirmation: ETT inserted through vocal cords under direct vision,  positive ETCO2 and breath sounds checked- equal and bilateral Secured at: 20 cm Tube secured with: Tape Dental Injury: Teeth and Oropharynx as per pre-operative assessment

## 2020-07-23 NOTE — Op Note (Signed)
Preop diagnosis: Pneumoperitoneum, abnormal CT of the abdomen.  Postoperative diagnosis: Same.  Operative procedure: Diagnostic laparoscopy, upper endoscopy.  Operating surgeon: Hervey Ard, MD.  Assistant: Arvilla Meres, RNFA.  Anesthesia: General endotracheal.  Foley catheter placed at the beginning and removed at the end of the procedure.  Clinical note: This 74 year old woman has been on peritoneal dialysis for about 2 months.  She has had difficulty with return in in the last 2-3 weeks noted abdominal distention.  Plain films of the abdomen of July 08, 2020 showed pneumoperitoneum.  The patient subsequently underwent a CT scan of the abdomen and pelvis with contrast on July 14, 2020 with suggestion of abnormality of the pylorus/proximal duodenum with which was described as "two apparent connections from the pylorus to the duodenum".  The patient is clinically asymptomatic except for the volume related to the pneumoperitoneum is impacting her COPD.  She has been afebrile and has a normal white count differential.  She is brought to the operating at this time for planned diagnostic laparoscopy to assess for possible occult intestinal injury as well as an upper endoscopy to assess the above-noted CT findings.  The patient had SCD stockings for DVT prevention and received Ancef prior to the procedure.  Operative note: With the patient under adequate general endotracheal anesthesia the abdomen was cleansed with ChloraPrep and draped.  The area of the peritoneal dialysis catheter had been covered with a Tegaderm dressing.  An incision in the left upper quadrant was made and a 5 mm Step port was inserted after the varies needle aspiration showed air and free irrigation.  Pneumoperitoneum was noted.  There was no inflammatory changes appreciated within the abdominal cavity.  There was one small adhesion from the omentum to the anterior abdominal wall just to the right of the falciform ligament  and this was taken down with cautery dissection.  There was a small amount of fluid from her most recent dialysis.  No purulent fluid.  The anterior surface of the stomach and pylorus could be evaluated and was unremarkable.  The visualized surface of the right colon and transverse colon was unremarkable.  The sigmoid colon appeared unremarkable.  The rectum was not fully visualized.  The small bowel was run from the ligament of Treitz to the terminal ileum with no evidence of inflammation or injury.  The peritoneal dialysis catheter was seen floating above the adjacent small bowel in the pelvis without any inflammatory process or fixation.  The upper GI endoscope was advanced under direct vision through the cricopharyngeus and into the stomach.  The stomach surface was unremarkable.  There was no inflammatory change in the area of the pylorus.  The pylorus was easily cannulated and the duodenal bulb was unremarkable.  The scope was advanced into the second portion of the duodenum which was unremarkable.  No evidence of inflammatory changes.  Retroflexed view of the stomach was unremarkable.  The stomach was desufflated and the scope was removed without incident.  After gowns and gloves were changed the 11 mm port was removed.  The defect was closed with a 0 Maxon suture making use of the "cone" and the TMI suture passer.  Small amount of bleeding from the superior edge of the suture passage site was controlled by tying the suture down.  A good seal was noted.  No internal or external bleeding was appreciated.  The 5 mm ports were removed after desufflation of the abdomen under direct vision.  Skin incisions were closed with 4-0  Vicryl subcuticular suture.  Benzoin Steri-Strips followed by Telfa and Tegaderm dressings were applied.  The patient tolerated the procedure well and was taken to recovery in stable condition.

## 2020-07-23 NOTE — Anesthesia Preprocedure Evaluation (Addendum)
Anesthesia Evaluation  Patient identified by MRN, date of birth, ID band Patient awake    Reviewed: Allergy & Precautions, H&P , NPO status , Patient's Chart, lab work & pertinent test results, reviewed documented beta blocker date and time   History of Anesthesia Complications Negative for: history of anesthetic complications  Airway Mallampati: III  TM Distance: >3 FB Neck ROM: Limited    Dental  (+) Upper Dentures, Dental Advisory Given   Pulmonary neg sleep apnea, COPD,  COPD inhaler, Current Smoker and Patient abstained from smoking.,  Never hospitalized for COPD exacerbation; breathing at baseline today   Pulmonary exam normal breath sounds clear to auscultation       Cardiovascular Exercise Tolerance: Poor METShypertension, + CAD and + Peripheral Vascular Disease  (-) Past MI Normal cardiovascular exam(-) dysrhythmias  Rhythm:regular Rate:Normal - Systolic murmurs TTE 8657: - Left ventricle: The cavity size was normal. There was mild  concentric hypertrophy. Systolic function was normal. The  estimated ejection fraction was in the range of 60% to 65%. Wall  motion was normal; there were no regional wall motion  abnormalities. Doppler parameters are consistent with abnormal  left ventricular relaxation (grade 1 diastolic dysfunction).  - Left atrium: The atrium was mildly dilated.  - Right ventricle: Systolic function was normal.  - Pulmonary arteries: Systolic pressure was within the normal  range.     Neuro/Psych PSYCHIATRIC DISORDERS Depression Hx neck surgery  Neuromuscular disease    GI/Hepatic negative GI ROS, Neg liver ROS, neg GERD  ,  Endo/Other  diabetes, Poorly Controlled, Type 2, Insulin Dependent  Renal/GU Dialysis and ESRFRenal diseaseOn peritoneal dialysis  negative genitourinary   Musculoskeletal   Abdominal   Peds  Hematology negative hematology ROS (+)   Anesthesia Other  Findings Past Medical History: No date: Backache, unspecified     Comment:  s/p c-spine and l-spine fusions No date: Degeneration of intervertebral disc, site unspecified No date: Depressive disorder, not elsewhere classified No date: Disorder of bone and cartilage, unspecified No date: HTN (hypertension) No date: Hx of left heart catheterization by cutdown     Comment:  a. 7 years ago; no significant cad; b. Lexiscan 2014: no              significant ischemia, no significant EKG changes               concerning for ischemia, EF 67%, overall low risk study No date: Kidney failure     Comment:  Dr Cyndia Diver No date: Myalgia and myositis, unspecified No date: Obesity, unspecified No date: Personal history of tobacco use, presenting hazards to health     Comment:  1/2 ppd No date: Plantar fascial fibromatosis No date: S/P cholecystectomy No date: S/p nephrectomy No date: Type II or unspecified type diabetes mellitus without  mention of complication, not stated as uncontrolled No date: Unspecified essential hypertension No date: Unspecified hereditary and idiopathic peripheral neuropathy     Comment:  secondary to diabetes Past Surgical History: No date: ABDOMINAL HYSTERECTOMY No date: APPENDECTOMY No date: BACK SURGERY     Comment:  2006 No date: CARDIAC CATHETERIZATION     Comment:  o.k. 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 No  date: LUMBAR FUSION No date: NECK SURGERY     Comment:  06/2005 No date: Problem with general Anesthesia     Comment:  02/2006 10/2004: REPLACEMENT TOTAL KNEE No date: trigger finger surgery No date: VESICOVAGINAL FISTULA CLOSURE W/ TAH 04/02/2015: VIDEO BRONCHOSCOPY;  Bilateral     Comment:  Procedure: VIDEO BRONCHOSCOPY WITHOUT FLUORO;  Surgeon:               Juanito Doom, MD;  Location: [facility] ENDOSCOPY;  Service:              Cardiopulmonary;  Laterality: Bilateral; BMI    Body Mass Index:  37.79 kg/m     Reproductive/Obstetrics negative OB ROS                             Anesthesia Physical  Anesthesia Plan  ASA: IV  Anesthesia Plan: General ETT   Post-op Pain Management:    Induction: Intravenous  PONV Risk Score and Plan: 3 and Ondansetron, Dexamethasone and Propofol infusion  Airway Management Planned: Oral ETT and Video Laryngoscope Planned  Additional Equipment: None  Intra-op Plan:   Post-operative Plan: Extubation in OR  Informed Consent: I have reviewed the patients History and Physical, chart, labs and discussed the procedure including the risks, benefits and alternatives for the proposed anesthesia with the patient or authorized representative who has indicated his/her understanding and acceptance.     Dental Advisory Given  Plan Discussed with: CRNA and Surgeon  Anesthesia Plan Comments: (Patient with blood sugar of 413 today. Poorly controlled diabetes insulin dependent ; her blood sugar spikes after peritoneal dialysis due to composition of dialysate. Spoke with surgeon Dr Bary Castilla regarding urgency of this case ; due to presence of pneumoperitoneum and poorly functioning peritoneal dialysis catheter, he believes risks of delaying this surgery would lead to more harm than proceeding with hyperglycemia. In this case I believe it is reasonable to proceed while treating the hyperglycemia. Will give 10 unites subq insulin to start. Spoke to patient regarding increased perioperative complications such as wound healing and infection. Patient understands.  Discussed risks of anesthesia with patient, including PONV, sore throat, lip/dental damage. Rare risks discussed as well, such as cardiorespiratory  and neurological sequelae. Patient understands.)       Anesthesia Quick Evaluation

## 2020-07-23 NOTE — Op Note (Signed)
[facility] Gastroenterology Patient Name: Andrea Stewart Procedure Date: 07/23/2020 11:29 AM MRN: [id number] Account #: [id number] Date of Birth: 1946/08/07 Admit Type: Outpatient Age: 74 Room: [facility] ENDO ROOM 1 Gender: Female Note Status: Finalized Procedure:             Upper GI endoscopy Indications:           Abnormal abdominal x-ray of the GI tract Providers:             Robert Bellow, MD Referring MD:          Rusty Aus, MD (Referring MD) Medicines:             General Anesthesia Complications:         No immediate complications. Procedure:             Pre-Anesthesia Assessment:                        - Prior to the procedure, a History and Physical was                         performed, and patient medications, allergies and                         sensitivities were reviewed. The patient's tolerance                         of previous anesthesia was reviewed.                        - The risks and benefits of the procedure and the                         sedation options and risks were discussed with the                         patient. All questions were answered and informed                         consent was obtained.                        After obtaining informed consent, the endoscope was                         passed under direct vision. Throughout the procedure,                         the patient's blood pressure, pulse, and oxygen                         saturations were monitored continuously. The Endoscope                         was introduced through the mouth, and advanced to the                         second part of duodenum. The upper GI endoscopy was  accomplished without difficulty. The patient tolerated                         the procedure well. Findings:      The esophagus was normal.      The stomach was normal.      The examined duodenum was normal. Impression:            - Normal esophagus.                         - Normal stomach.                        - Normal examined duodenum.                        - No specimens collected. Recommendation:        - Discharge patient to home (via wheelchair). Procedure Code(s):     --- Professional ---                        [phone], Esophagogastroduodenoscopy, flexible,                         transoral; diagnostic, including collection of                         specimen(s) by brushing or washing, when performed                         (separate procedure) Diagnosis Code(s):     --- Professional ---                        R93.3, Abnormal findings on diagnostic imaging of                         other parts of digestive tract CPT copyright 2019 [employer]. All rights reserved. The codes documented in this report are preliminary and upon coder review may  be revised to meet current compliance requirements. Robert Bellow, MD 07/23/2020 1:33:13 PM This report has been signed electronically. Number of Addenda: 0 Note Initiated On: 07/23/2020 11:29 AM Estimated Blood Loss:  Estimated blood loss: none.      [facility]

## 2020-07-23 NOTE — Anesthesia Postprocedure Evaluation (Signed)
Anesthesia Post Note  Patient: SACHIKO METHOT  Procedure(s) Performed: LAPAROSCOPIC ABDOMINAL EXPLORATION (N/A Abdomen) UPPER GI ENDOSCOPY (N/A Abdomen)  Patient location during evaluation: PACU Anesthesia Type: General Level of consciousness: awake and alert Pain management: pain level controlled Vital Signs Assessment: post-procedure vital signs reviewed and stable Respiratory status: spontaneous breathing, nonlabored ventilation, respiratory function stable and patient connected to nasal cannula oxygen Cardiovascular status: blood pressure returned to baseline and stable Postop Assessment: no apparent nausea or vomiting Anesthetic complications: no   No complications documented.   Last Vitals:  Vitals:   07/23/20 1430 07/23/20 1445  BP: 127/74 123/71  Pulse: 64 68  Resp: 10 10  Temp:  36.4 C  SpO2: 98% 98%    Last Pain:  Vitals:   07/23/20 1445  TempSrc:   PainSc: 4                  Arita Miss

## 2020-07-23 NOTE — Transfer of Care (Signed)
Immediate Anesthesia Transfer of Care Note  Patient: Andrea Stewart  Procedure(s) Performed: LAPAROSCOPIC ABDOMINAL EXPLORATION (N/A ) ESOPHAGOGASTRODUODENOSCOPY (EGD)  Patient Location: PACU  Anesthesia Type:General  Level of Consciousness: awake, alert , oriented and patient cooperative  Airway & Oxygen Therapy: Patient Spontanous Breathing and Patient connected to face mask oxygen  Post-op Assessment: Report given to RN and Post -op Vital signs reviewed and stable  Post vital signs: Reviewed and stable  Last Vitals:  Vitals Value Taken Time  BP 122/89 07/23/20 1345  Temp 36.2 C 07/23/20 1345  Pulse 69 07/23/20 1345  Resp 18 07/23/20 1345  SpO2 100 % 07/23/20 1345    Last Pain:  Vitals:   07/23/20 1345  TempSrc:   PainSc: Asleep         Complications: No complications documented.

## 2020-07-23 NOTE — Progress Notes (Signed)
Pt CBG: 312. Dr. Bertell Maria notified. Acknowledged. Orders received.

## 2020-07-23 NOTE — H&P (Addendum)
Andrea Stewart [id number] 06/02/1946     HPI: Patient with pneumoperitoneum from an unknown source. For laparoscopy and EGD.   Facility-Administered Medications Prior to Admission  Medication Dose Route Frequency Provider Last Rate Last Admin  . albuterol (PROVENTIL HFA;VENTOLIN HFA) 108 (90 Base) MCG/ACT inhaler 2 puff  2 puff Inhalation Q4H PRN Agueda Houpt, Forest Gleason, MD      . lactated ringers infusion   Intravenous Continuous Bary Castilla, Forest Gleason, MD      . ondansetron [facility]) 4 mg in sodium chloride 0.9 % 50 mL IVPB  4 mg Intravenous Q4H PRN Robert Bellow, MD      . traMADol Veatrice Bourbon) tablet 50 mg  50 mg Oral Q4H PRN Robert Bellow, MD       Medications Prior to Admission  Medication Sig Dispense Refill Last Dose  . albuterol (PROVENTIL HFA) 108 (90 Base) MCG/ACT inhaler Inhale 2 puffs every 4 (four) hours as needed into the lungs for wheezing or shortness of breath. 1 Inhaler 2 07/23/2020 at Unknown time  . albuterol (PROVENTIL) (2.5 MG/3ML) 0.083% nebulizer solution Take 3 mLs (2.5 mg total) by nebulization every 4 (four) hours as needed for wheezing or shortness of breath. 150 mL 1 07/23/2020 at Unknown time  . atorvastatin (LIPITOR) 20 MG tablet TAKE 1 TABLET BY MOUTH DAILY (Patient taking differently: Take 20 mg by mouth daily with supper. ) 90 tablet 1 07/22/2020 at Unknown time  . B Complex-C-Folic Acid (RENAL) 1 MG CAPS Take 1 capsule by mouth daily.     Marland Kitchen BAYER ASPIRIN EC LOW DOSE 81 MG EC tablet Take 81 mg by mouth daily.     . budesonide (PULMICORT) 0.25 MG/2ML nebulizer solution Take 2 mLs (0.25 mg total) by nebulization 2 (two) times daily. (Patient taking differently: Take 0.25 mg by nebulization 2 (two) times daily as needed (respiratory issues.). ) 60 mL 12 07/23/2020 at Unknown time  . budesonide-formoterol (SYMBICORT) 160-4.5 MCG/ACT inhaler Inhale 2 puffs into the lungs 2 (two) times daily. (Patient taking differently: Inhale 2 puffs into the lungs 2 (two) times daily as  needed (respiratory issues). ) 1 Inhaler 5 07/23/2020 at Unknown time  . carvedilol (COREG) 25 MG tablet Take 25 mg by mouth 2 (two) times daily.   07/23/2020 at 0730  . Cholecalciferol (VITAMIN D3) 50 MCG (2000 UT) TABS Take 2,000 Units by mouth daily. Gummy   07/22/2020 at Unknown time  . cyclobenzaprine (FLEXERIL) 10 MG tablet Take 10 mg by mouth daily as needed. Back pain. (Patient not taking: Reported on 07/17/2020)     . furosemide (LASIX) 80 MG tablet Take 80 mg by mouth daily.   07/22/2020 at Unknown time  . hydrOXYzine (ATARAX/VISTARIL) 25 MG tablet Take 25 mg by mouth in the morning and at bedtime.   07/22/2020 at Unknown time  . INSULIN LISPRO 100 UNIT/ML KwikPen Junior Inject 2-6 Units into the skin 3 (three) times daily before meals. 100-150=2 units, 151-200=3 units, 201-280=4 units, 281-350=5 units,  & 351-400=6 units (Patient not taking: Reported on 07/17/2020)     . insulin NPH (HUMULIN N,NOVOLIN N) 100 UNIT/ML injection Inject 20-30 Units into the skin See admin instructions. Inject 20 units subcutaneously in the morning & inject 30 units subcutaneously at night.   07/22/2020 at Unknown time  . insulin regular (NOVOLIN R) 100 units/mL injection Inject into the skin 3 (three) times daily before meals.   07/22/2020 at Unknown time  . irbesartan (AVAPRO) 150 MG tablet Take 150 mg  by mouth 2 (two) times daily.    07/22/2020 at Unknown time  . nitroGLYCERIN (NITROSTAT) 0.4 MG SL tablet Place 1 tablet (0.4 mg total) under the tongue every 5 (five) minutes as needed. Maximum 3 tablets (Patient taking differently: Place 0.4 mg under the tongue every 5 (five) minutes x 3 doses as needed for chest pain. Maximum 3 tablets) 25 tablet 6   . Probiotic Product (PROBIOTIC PO) Take 1 capsule by mouth daily.     Beryle Quant CALCIUM 1500 (600 Ca) MG TABS tablet Take 1 tablet by mouth daily.   Past Week at Unknown time  . traMADol (ULTRAM) 50 MG tablet Take 50 mg by mouth daily as needed. Back pain. (Patient not taking: Reported  on 07/17/2020)     . traZODone (DESYREL) 50 MG tablet Take 0.5-1 tablets (25-50 mg total) by mouth at bedtime as needed for sleep. (Patient taking differently: Take 25 mg by mouth at bedtime. ) 30 tablet 3 07/22/2020 at Unknown time  . amLODipine (NORVASC) 10 MG tablet TAKE 1 TABLET BY MOUTH DAILY (Patient not taking: Reported on 07/17/2020) 90 tablet 3 Not Taking at Unknown time  . buPROPion (WELLBUTRIN SR) 100 MG 12 hr tablet Take 1 tablet (100 mg total) by mouth 2 (two) times daily. (Patient not taking: Reported on 07/17/2020) 60 tablet 2 Not Taking at Unknown time  . Spacer/Aero-Holding Chambers (AEROCHAMBER MV) inhaler Use as instructed 1 each 0    Allergies  Allergen Reactions  . Morphine Anaphylaxis  . Prednisone Other (See Comments)    "makes me think I am having a heart attack"  . Gabapentin Other (See Comments)    intolerance  . Hydralazine Itching  . Nsaids Diarrhea  . Penicillins Swelling    Has patient had a PCN reaction causing immediate rash, facial/tongue/throat swelling, SOB or lightheadedness with hypotension: Yes Has patient had a PCN reaction causing severe rash involving mucus membranes or skin necrosis: No Has patient had a PCN reaction that required hospitalization: No Has patient had a PCN reaction occurring within the last 10 years: No If all of the above answers are "NO", then may proceed with Cephalosporin use.    Past Medical History:  Diagnosis Date  . Backache, unspecified    s/p c-spine and l-spine fusions  . COPD (chronic obstructive pulmonary disease) ([place])   . Degeneration of intervertebral disc, site unspecified   . Depressive disorder, not elsewhere classified   . Disorder of bone and cartilage, unspecified   . Family history of adverse reaction to anesthesia    mother did, but patient not sure of the complication  . HTN (hypertension)   . Hx of left heart catheterization by cutdown    a. 7 years ago; no significant cad; b. Lexiscan 2014: no significant  ischemia, no significant EKG changes concerning for ischemia, EF 67%, overall low risk study  . Kidney failure    Dr Cyndia Diver  . Myalgia and myositis, unspecified   . Obesity, unspecified   . Personal history of tobacco use, presenting hazards to health    1/2 ppd  . Plantar fascial fibromatosis   . S/P cholecystectomy   . S/p nephrectomy   . Type II or unspecified type diabetes mellitus without mention of complication, not stated as uncontrolled   . Unspecified essential hypertension   . Unspecified hereditary and idiopathic peripheral neuropathy    secondary to diabetes   Past Surgical History:  Procedure Laterality Date  . ABDOMINAL HYSTERECTOMY    .  APPENDECTOMY    . BACK SURGERY     1966 and 2006   . CARDIAC CATHETERIZATION     o.k. 2003  . CARPAL TUNNEL RELEASE    . CERVICAL DISCECTOMY    . CHOLECYSTECTOMY    . COLONOSCOPY  09/1999   colonoscopy-hemorrhoids, re-check 5 years (10/2006)  . dexa     osteopenia (01/2004)  . ELBOW SURGERY    . FOOT SURGERY     x 3  . JOINT REPLACEMENT    . KIDNEY DONATION  1991  . LUMBAR FUSION    . NECK SURGERY     06/2005  . Problem with general Anesthesia     02/2006  . REPLACEMENT TOTAL KNEE  10/2004  . trigger finger surgery    . VENTRAL HERNIA REPAIR N/A 09/08/2017   Primary repair of a 10 x 15 mm infraumbilical fascial defect.  Surgeon: Robert Bellow, MD;  Location: [facility] ORS;  Service: General;  Laterality: N/A;  . VESICOVAGINAL FISTULA CLOSURE W/ TAH    . VIDEO BRONCHOSCOPY Bilateral 04/02/2015   Procedure: VIDEO BRONCHOSCOPY WITHOUT FLUORO;  Surgeon: Juanito Doom, MD;  Location[facility] ENDOSCOPY;  Service: Cardiopulmonary;  Laterality: Bilateral;   Social History   Socioeconomic History  . Marital status: Married    Spouse name: Not on file  . Number of children: 4  . Years of education: Not on file  . Highest education level: Not on file  Occupational History  . Occupation: Retired  Tobacco Use  . Smoking  status: Current Every Day Smoker    Packs/day: 0.25    Years: 30.00    Pack years: 7.50    Types: Cigarettes  . Smokeless tobacco: Never Used  . Tobacco comment: currently smoking .25 ppd, trying to quit 02/13/16  Substance and Sexual Activity  . Alcohol use: No    Alcohol/week: 0.0 standard drinks  . Drug use: No  . Sexual activity: Never  Other Topics Concern  . Not on file  Social History Narrative   Lives in [place]. Disability because of back   Social Determinants of Health   Financial Resource Strain:   . Difficulty of Paying Living Expenses: Not on file  Food Insecurity:   . Worried About Charity fundraiser in the Last Year: Not on file  . Ran Out of Food in the Last Year: Not on file  Transportation Needs:   . Lack of Transportation (Medical): Not on file  . Lack of Transportation (Non-Medical): Not on file  Physical Activity:   . Days of Exercise per Week: Not on file  . Minutes of Exercise per Session: Not on file  Stress:   . Feeling of Stress : Not on file  Social Connections:   . Frequency of Communication with Friends and Family: Not on file  . Frequency of Social Gatherings with Friends and Family: Not on file  . Attends Religious Services: Not on file  . Active Member of Clubs or Organizations: Not on file  . Attends Archivist Meetings: Not on file  . Marital Status: Not on file  Intimate Partner Violence:   . Fear of Current or Ex-Partner: Not on file  . Emotionally Abused: Not on file  . Physically Abused: Not on file  . Sexually Abused: Not on file   Social History   Social History Narrative   Lives in [place]. Disability because of back     ROS: Negative.     PE: HEENT: Negative.  Lungs: Clear. Cardio: RR.   Assessment/Plan: Blood sugars run 250-350 on dialysis based on review of home records.  BS up today at 410+. Considering catheter issues and pneumoperitoneum, I think it is important to proceed.    Proceed with  planned laparoscopy/ laparotomy and EGD.    Forest Gleason [facility] 07/23/2020

## 2020-07-23 NOTE — Discharge Instructions (Signed)

## 2020-07-23 NOTE — Progress Notes (Signed)
Inpatient Diabetes Program Recommendations  AACE/ADA: New Consensus Statement on Inpatient Glycemic Control   Target Ranges:  Prepandial:   less than 140 mg/dL      Peak postprandial:   less than 180 mg/dL (1-2 hours)      Critically ill patients:  140 - 180 mg/dL   Results for VERNON, ARIEL (MRN [id number]) as of 07/23/2020 13:26  Ref. Range 07/23/2020 12:59  Glucose-Capillary Latest Ref Range: 70 - 99 mg/dL 316 (H)  Novolog 10 units@13 :12  Results for MANJU, KULKARNI (MRN [id number]) as of 07/23/2020 13:26  Ref. Range 07/23/2020 11:12  Glucose Latest Ref Range: 70 - 99 mg/dL 413 (H)  Novolog 10 units@11 :38   Review of Glycemic Control  Diabetes history: DM2 Outpatient Diabetes medications: NPH 20 units QAM, NPH 30 units QPM, Regular TID with meals Current orders for Inpatient glycemic control: NONE; in OR  Inpatient Diabetes Program Recommendations:    Insulin: Please consider ordering Levemir 12 units BID (based on 80.3 kg x 0.3 units), CBGs Q4H, Novolog 0-9 units Q4H.  Thanks, Barnie Alderman, RN, MSN, CDE Diabetes Coordinator Inpatient Diabetes Program [phone] (Team Pager from 8am to 5pm)

## 2020-07-24 ENCOUNTER — Encounter: Payer: Self-pay | Admitting: General Surgery

## 2020-08-06 ENCOUNTER — Other Ambulatory Visit: Payer: Self-pay | Admitting: Internal Medicine

## 2020-08-11 DIAGNOSIS — E538 Deficiency of other specified B group vitamins: Secondary | ICD-10-CM | POA: Diagnosis not present

## 2020-08-11 DIAGNOSIS — Z23 Encounter for immunization: Secondary | ICD-10-CM | POA: Diagnosis not present

## 2020-08-11 DIAGNOSIS — I700 Atherosclerosis of aorta: Secondary | ICD-10-CM | POA: Diagnosis not present

## 2020-08-11 DIAGNOSIS — M542 Cervicalgia: Secondary | ICD-10-CM | POA: Diagnosis not present

## 2020-08-11 DIAGNOSIS — I739 Peripheral vascular disease, unspecified: Secondary | ICD-10-CM | POA: Diagnosis not present

## 2020-08-11 DIAGNOSIS — N186 End stage renal disease: Secondary | ICD-10-CM | POA: Diagnosis not present

## 2020-08-14 ENCOUNTER — Other Ambulatory Visit: Payer: Self-pay | Admitting: Internal Medicine

## 2020-08-14 DIAGNOSIS — I10 Essential (primary) hypertension: Secondary | ICD-10-CM | POA: Diagnosis not present

## 2020-08-14 DIAGNOSIS — G609 Hereditary and idiopathic neuropathy, unspecified: Secondary | ICD-10-CM | POA: Diagnosis not present

## 2020-08-14 DIAGNOSIS — E162 Hypoglycemia, unspecified: Secondary | ICD-10-CM | POA: Diagnosis not present

## 2020-08-14 DIAGNOSIS — R809 Proteinuria, unspecified: Secondary | ICD-10-CM | POA: Diagnosis not present

## 2020-08-14 DIAGNOSIS — E1165 Type 2 diabetes mellitus with hyperglycemia: Secondary | ICD-10-CM | POA: Diagnosis not present

## 2020-08-14 DIAGNOSIS — Z992 Dependence on renal dialysis: Secondary | ICD-10-CM | POA: Diagnosis not present

## 2020-08-14 DIAGNOSIS — Z1231 Encounter for screening mammogram for malignant neoplasm of breast: Secondary | ICD-10-CM

## 2020-08-14 DIAGNOSIS — N186 End stage renal disease: Secondary | ICD-10-CM | POA: Diagnosis not present

## 2020-08-15 DIAGNOSIS — N2581 Secondary hyperparathyroidism of renal origin: Secondary | ICD-10-CM | POA: Diagnosis not present

## 2020-08-15 DIAGNOSIS — E440 Moderate protein-calorie malnutrition: Secondary | ICD-10-CM | POA: Diagnosis not present

## 2020-08-15 DIAGNOSIS — N2589 Other disorders resulting from impaired renal tubular function: Secondary | ICD-10-CM | POA: Diagnosis not present

## 2020-08-15 DIAGNOSIS — D631 Anemia in chronic kidney disease: Secondary | ICD-10-CM | POA: Diagnosis not present

## 2020-08-15 DIAGNOSIS — R82998 Other abnormal findings in urine: Secondary | ICD-10-CM | POA: Diagnosis not present

## 2020-08-15 DIAGNOSIS — K650 Generalized (acute) peritonitis: Secondary | ICD-10-CM | POA: Diagnosis not present

## 2020-08-15 DIAGNOSIS — Z992 Dependence on renal dialysis: Secondary | ICD-10-CM | POA: Diagnosis not present

## 2020-08-15 DIAGNOSIS — N186 End stage renal disease: Secondary | ICD-10-CM | POA: Diagnosis not present

## 2020-08-15 DIAGNOSIS — D509 Iron deficiency anemia, unspecified: Secondary | ICD-10-CM | POA: Diagnosis not present

## 2020-08-15 DIAGNOSIS — Z79899 Other long term (current) drug therapy: Secondary | ICD-10-CM | POA: Diagnosis not present

## 2020-08-19 ENCOUNTER — Other Ambulatory Visit: Payer: Self-pay

## 2020-08-19 ENCOUNTER — Ambulatory Visit
Admission: RE | Admit: 2020-08-19 | Discharge: 2020-08-19 | Disposition: A | Discharge status: Home / Self Care | Payer: PPO | Source: Ambulatory Visit | Attending: Internal Medicine | Admitting: Internal Medicine

## 2020-08-19 DIAGNOSIS — Z1231 Encounter for screening mammogram for malignant neoplasm of breast: Secondary | ICD-10-CM | POA: Insufficient documentation

## 2020-08-26 ENCOUNTER — Ambulatory Visit (INDEPENDENT_AMBULATORY_CARE_PROVIDER_SITE_OTHER): Payer: PPO | Admitting: Nurse Practitioner

## 2020-08-26 ENCOUNTER — Ambulatory Visit (INDEPENDENT_AMBULATORY_CARE_PROVIDER_SITE_OTHER): Payer: PPO

## 2020-08-26 ENCOUNTER — Encounter (INDEPENDENT_AMBULATORY_CARE_PROVIDER_SITE_OTHER): Payer: Self-pay | Admitting: Nurse Practitioner

## 2020-08-26 ENCOUNTER — Other Ambulatory Visit: Payer: Self-pay

## 2020-08-26 VITALS — BP 120/69 | HR 70 | Ht 59.0 in | Wt 176.0 lb

## 2020-08-26 DIAGNOSIS — I739 Peripheral vascular disease, unspecified: Secondary | ICD-10-CM

## 2020-08-26 DIAGNOSIS — I70212 Atherosclerosis of native arteries of extremities with intermittent claudication, left leg: Secondary | ICD-10-CM | POA: Diagnosis not present

## 2020-08-26 DIAGNOSIS — N186 End stage renal disease: Secondary | ICD-10-CM

## 2020-08-26 DIAGNOSIS — I10 Essential (primary) hypertension: Secondary | ICD-10-CM | POA: Diagnosis not present

## 2020-08-26 DIAGNOSIS — F17200 Nicotine dependence, unspecified, uncomplicated: Secondary | ICD-10-CM

## 2020-08-31 ENCOUNTER — Encounter (INDEPENDENT_AMBULATORY_CARE_PROVIDER_SITE_OTHER): Payer: Self-pay | Admitting: Nurse Practitioner

## 2020-08-31 NOTE — Progress Notes (Signed)
Subjective:    Patient ID: Andrea Stewart, female    DOB: Oct 08, 1946, 74 y.o.   MRN: [id number] Chief Complaint  Patient presents with  . Follow-up    6 mo U/S     The patient returns to the office for followup and review of the noninvasive studies. There have been no interval changes in lower extremity symptoms. No interval shortening of the patient's claudication distance or development of rest pain symptoms. No new ulcers or wounds have occurred since the last visit.  The patient has started on peritoneal dialysis and has had some difficulty with her peritoneal dialysis catheters and required multiple revisions and is still not dialyzing 100% without issue.  The patient denies amaurosis fugax or recent TIA symptoms. There are no recent neurological changes noted. The patient denies history of DVT, PE or superficial thrombophlebitis. The patient denies recent episodes of angina or shortness of breath.   ABI Rt=0.84 and Lt=0.87  (previous ABI's Rt= 0.84 and Lt=0.65) Duplex ultrasound of the bilateral tibial arteries show biphasic/monophasic waveforms with slightly dampened toe waveforms bilaterally.   Review of Systems  Cardiovascular: Positive for leg swelling.  Gastrointestinal: Positive for abdominal distention.  Musculoskeletal: Positive for gait problem.  All other systems reviewed and are negative.      Objective:   Physical Exam Vitals reviewed.  HENT:     Head: Normocephalic.  Cardiovascular:     Rate and Rhythm: Normal rate.     Pulses: Decreased pulses.  Pulmonary:     Effort: Pulmonary effort is normal.  Skin:    General: Skin is warm and dry.  Neurological:     Mental Status: She is alert and oriented to person, place, and time.  Psychiatric:        Mood and Affect: Mood normal.        Behavior: Behavior normal.        Thought Content: Thought content normal.        Judgment: Judgment normal.     BP 120/69   Pulse 70   Ht 4\' 11"  (1.499 m)   Wt  176 lb (79.8 kg)   BMI 35.55 kg/m   Past Medical History:  Diagnosis Date  . Backache, unspecified    s/p c-spine and l-spine fusions  . COPD (chronic obstructive pulmonary disease) ([place])   . Degeneration of intervertebral disc, site unspecified   . Depressive disorder, not elsewhere classified   . Disorder of bone and cartilage, unspecified   . Family history of adverse reaction to anesthesia    mother did, but patient not sure of the complication  . HTN (hypertension)   . Hx of left heart catheterization by cutdown    a. 7 years ago; no significant cad; b. Lexiscan 2014: no significant ischemia, no significant EKG changes concerning for ischemia, EF 67%, overall low risk study  . Kidney failure    Dr Cyndia Diver  . Myalgia and myositis, unspecified   . Obesity, unspecified   . Personal history of tobacco use, presenting hazards to health    1/2 ppd  . Plantar fascial fibromatosis   . S/P cholecystectomy   . S/p nephrectomy   . Type II or unspecified type diabetes mellitus without mention of complication, not stated as uncontrolled   . Unspecified essential hypertension   . Unspecified hereditary and idiopathic peripheral neuropathy    secondary to diabetes    Social History   Socioeconomic History  . Marital status: Married    Spouse  name: Not on file  . Number of children: 4  . Years of education: Not on file  . Highest education level: Not on file  Occupational History  . Occupation: Retired  Tobacco Use  . Smoking status: Current Every Day Smoker    Packs/day: 0.25    Years: 30.00    Pack years: 7.50    Types: Cigarettes  . Smokeless tobacco: Never Used  . Tobacco comment: currently smoking .25 ppd, trying to quit 02/13/16  Substance and Sexual Activity  . Alcohol use: No    Alcohol/week: 0.0 standard drinks  . Drug use: No  . Sexual activity: Never  Other Topics Concern  . Not on file  Social History Narrative   Lives in [place]. Disability because of back     Social Determinants of Health   Financial Resource Strain:   . Difficulty of Paying Living Expenses: Not on file  Food Insecurity:   . Worried About Charity fundraiser in the Last Year: Not on file  . Ran Out of Food in the Last Year: Not on file  Transportation Needs:   . Lack of Transportation (Medical): Not on file  . Lack of Transportation (Non-Medical): Not on file  Physical Activity:   . Days of Exercise per Week: Not on file  . Minutes of Exercise per Session: Not on file  Stress:   . Feeling of Stress : Not on file  Social Connections:   . Frequency of Communication with Friends and Family: Not on file  . Frequency of Social Gatherings with Friends and Family: Not on file  . Attends Religious Services: Not on file  . Active Member of Clubs or Organizations: Not on file  . Attends Archivist Meetings: Not on file  . Marital Status: Not on file  Intimate Partner Violence:   . Fear of Current or Ex-Partner: Not on file  . Emotionally Abused: Not on file  . Physically Abused: Not on file  . Sexually Abused: Not on file    Past Surgical History:  Procedure Laterality Date  . ABDOMINAL HYSTERECTOMY    . APPENDECTOMY    . BACK SURGERY     1966 and 2006   . CARDIAC CATHETERIZATION     o.k. 2003  . CARPAL TUNNEL RELEASE    . CERVICAL DISCECTOMY    . CHOLECYSTECTOMY    . COLONOSCOPY  09/1999   colonoscopy-hemorrhoids, re-check 5 years (10/2006)  . dexa     osteopenia (01/2004)  . ELBOW SURGERY    . ESOPHAGOGASTRODUODENOSCOPY (EGD) WITH PROPOFOL N/A 07/23/2020   Procedure: ESOPHAGOGASTRODUODENOSCOPY (EGD) WITH PROPOFOL;  Surgeon: Robert Bellow, MD;  Location: [facility];  Service: Endoscopy;  Laterality: N/A;  TRAVEL TO O.R. - PATIENT TO HAVE SURGERY  . FOOT SURGERY     x 3  . JOINT REPLACEMENT    . KIDNEY DONATION  1991  . LAPAROSCOPIC ABDOMINAL EXPLORATION N/A 07/23/2020   Procedure: LAPAROSCOPIC ABDOMINAL EXPLORATION;  Surgeon: Robert Bellow, MD;  Location: [facility];  Service: General;  Laterality: N/A;  . LUMBAR FUSION    . NECK SURGERY     06/2005  . Problem with general Anesthesia     02/2006  . REPLACEMENT TOTAL KNEE  10/2004  . trigger finger surgery    . UPPER GI ENDOSCOPY N/A 07/23/2020   Procedure: UPPER GI ENDOSCOPY;  Surgeon: Robert Bellow, MD;  Location: [facility];  Service: General;  Laterality: N/A;  . VENTRAL HERNIA  REPAIR N/A 09/08/2017   Primary repair of a 10 x 15 mm infraumbilical fascial defect.  Surgeon: Robert Bellow, MD;  Location: [facility];  Service: General;  Laterality: N/A;  . VESICOVAGINAL FISTULA CLOSURE W/ TAH    . VIDEO BRONCHOSCOPY Bilateral 04/02/2015   Procedure: VIDEO BRONCHOSCOPY WITHOUT FLUORO;  Surgeon: Juanito Doom, MD;  Location: [facility];  Service: Cardiopulmonary;  Laterality: Bilateral;    Family History  Problem Relation Age of Onset  . Hypertension Mother   . Colon cancer Mother   . Cancer Mother        colon  . Coronary artery disease Mother   . Diabetes Mother   . Prostate cancer Father   . COPD Father   . Cancer Father        prostate  . Heart attack Brother 41  . Sleep apnea Brother   . Diabetes Brother   . Diabetes Paternal Aunt   . Diabetes Paternal Uncle   . Cancer Daughter        Brain, lungs, liver, spine, kidney    Allergies  Allergen Reactions  . Morphine Anaphylaxis  . Prednisone Other (See Comments)    "makes me think I am having a heart attack"  . Gabapentin Other (See Comments)    intolerance  . Hydralazine Itching  . Nsaids Diarrhea  . Penicillins Swelling    Has patient had a PCN reaction causing immediate rash, facial/tongue/throat swelling, SOB or lightheadedness with hypotension: Yes Has patient had a PCN reaction causing severe rash involving mucus membranes or skin necrosis: No Has patient had a PCN reaction that required hospitalization: No Has patient had a PCN reaction occurring within the last 10 years: No If all of  the above answers are "NO", then may proceed with Cephalosporin use.        Assessment & Plan:   1. PAD (peripheral artery disease) (HCC)  Recommend:  The patient has evidence of atherosclerosis of the lower extremities with claudication.  The patient does not voice lifestyle limiting changes at this point in time.  Noninvasive studies do not suggest clinically significant change.  No invasive studies, angiography or surgery at this time The patient should continue walking and begin a more formal exercise program.  The patient should continue antiplatelet therapy and aggressive treatment of the lipid abnormalities  No changes in the patient's medications at this time  The patient should continue wearing graduated compression socks 10-15 mmHg strength to control the mild edema.   Patient will follow up in 6 months 2. Essential hypertension Continue antihypertensive medications as already ordered, these medications have been reviewed and there are no changes at this time.   3. Tobacco use disorder Smoking cessation was discussed, 3-10 minutes spent on this topic specifically   4. End stage renal disease [facility]) Patient will continue to follow with nephrology while on peritoneal dialysis    Current Outpatient Medications on File Prior to Visit  Medication Sig Dispense Refill  . albuterol (PROVENTIL HFA) 108 (90 Base) MCG/ACT inhaler Inhale 2 puffs every 4 (four) hours as needed into the lungs for wheezing or shortness of breath. 1 Inhaler 2  . albuterol (PROVENTIL) (2.5 MG/3ML) 0.083% nebulizer solution Take 3 mLs (2.5 mg total) by nebulization every 4 (four) hours as needed for wheezing or shortness of breath. 150 mL 1  . amLODipine (NORVASC) 10 MG tablet TAKE 1 TABLET BY MOUTH DAILY 90 tablet 3  . atorvastatin (LIPITOR) 20 MG tablet TAKE 1 TABLET  BY MOUTH DAILY (Patient taking differently: Take 20 mg by mouth daily with supper. ) 90 tablet 1  . B Complex-C-Folic Acid (RENAL) 1  MG CAPS Take 1 capsule by mouth daily.    Marland Kitchen BAYER ASPIRIN EC LOW DOSE 81 MG EC tablet Take 81 mg by mouth daily.    . budesonide (PULMICORT) 0.25 MG/2ML nebulizer solution Take 2 mLs (0.25 mg total) by nebulization 2 (two) times daily. (Patient taking differently: Take 0.25 mg by nebulization 2 (two) times daily as needed (respiratory issues.). ) 60 mL 12  . budesonide-formoterol (SYMBICORT) 160-4.5 MCG/ACT inhaler Inhale 2 puffs into the lungs 2 (two) times daily. (Patient taking differently: Inhale 2 puffs into the lungs 2 (two) times daily as needed (respiratory issues). ) 1 Inhaler 5  . buPROPion (WELLBUTRIN SR) 100 MG 12 hr tablet Take 1 tablet (100 mg total) by mouth 2 (two) times daily. 60 tablet 2  . calcium acetate (PHOSLO) 667 MG tablet Take by mouth.    . carvedilol (COREG) 25 MG tablet Take 25 mg by mouth 2 (two) times daily.    . Cholecalciferol (VITAMIN D3) 50 MCG (2000 UT) TABS Take 2,000 Units by mouth daily. Gummy    . cyclobenzaprine (FLEXERIL) 10 MG tablet Take 10 mg by mouth daily as needed. Back pain.    . furosemide (LASIX) 80 MG tablet Take 80 mg by mouth daily.    Marland Kitchen gentamicin cream (GARAMYCIN) 0.1 % Apply topically.    . hydrOXYzine (ATARAX/VISTARIL) 25 MG tablet Take 25 mg by mouth in the morning and at bedtime.    . INSULIN LISPRO 100 UNIT/ML KwikPen Junior Inject 2-6 Units into the skin 3 (three) times daily before meals. 100-150=2 units, 151-200=3 units, 201-280=4 units, 281-350=5 units,  & 351-400=6 units    . insulin NPH (HUMULIN N,NOVOLIN N) 100 UNIT/ML injection Inject 20-30 Units into the skin See admin instructions. Inject 20 units subcutaneously in the morning & inject 30 units subcutaneously at night.    . insulin regular (NOVOLIN R) 100 units/mL injection Inject into the skin 3 (three) times daily before meals.    . irbesartan (AVAPRO) 150 MG tablet Take 150 mg by mouth 2 (two) times daily.     . nitroGLYCERIN (NITROSTAT) 0.4 MG SL tablet Place 1 tablet (0.4 mg  total) under the tongue every 5 (five) minutes as needed. Maximum 3 tablets (Patient taking differently: Place 0.4 mg under the tongue every 5 (five) minutes x 3 doses as needed for chest pain. Maximum 3 tablets) 25 tablet 6  . Probiotic Product (PROBIOTIC PO) Take 1 capsule by mouth daily.    Marland Kitchen Spacer/Aero-Holding Chambers (AEROCHAMBER MV) inhaler Use as instructed 1 each 0  . SUPER CALCIUM 1500 (600 Ca) MG TABS tablet Take 1 tablet by mouth daily.    . traMADol (ULTRAM) 50 MG tablet Take 50 mg by mouth daily as needed. Back pain.     . traZODone (DESYREL) 50 MG tablet TAKE ONE-HALF TO ONE TABLET BY MOUTH AT BEDTIME AS NEEDED FOR SLEEP 30 tablet 3  . [DISCONTINUED] valsartan-hydrochlorothiazide (DIOVAN-HCT) 320-25 MG per tablet TAKE ONE (1) TABLET EACH DAY 30 tablet 6   Current Facility-Administered Medications on File Prior to Visit  Medication Dose Route Frequency Provider Last Rate Last Admin  . albuterol (PROVENTIL HFA;VENTOLIN HFA) 108 (90 Base) MCG/ACT inhaler 2 puff  2 puff Inhalation Q4H PRN Byrnett, Forest Gleason, MD      . lactated ringers infusion   Intravenous Continuous Hervey Ard  W, MD      . ondansetron (ZOFRAN) 4 mg in sodium chloride 0.9 % 50 mL IVPB  4 mg Intravenous Q4H PRN Robert Bellow, MD      . traMADol Veatrice Bourbon) tablet 50 mg  50 mg Oral Q4H PRN Byrnett, Forest Gleason, MD        There are no Patient Instructions on file for this visit. No follow-ups on file.   Kris Hartmann, NP

## 2020-09-12 ENCOUNTER — Ambulatory Visit: Payer: PPO

## 2020-09-13 DIAGNOSIS — E1165 Type 2 diabetes mellitus with hyperglycemia: Secondary | ICD-10-CM | POA: Diagnosis not present

## 2020-09-14 DIAGNOSIS — N186 End stage renal disease: Secondary | ICD-10-CM | POA: Diagnosis not present

## 2020-09-14 DIAGNOSIS — Z992 Dependence on renal dialysis: Secondary | ICD-10-CM | POA: Diagnosis not present

## 2020-09-15 DIAGNOSIS — D509 Iron deficiency anemia, unspecified: Secondary | ICD-10-CM | POA: Diagnosis not present

## 2020-09-15 DIAGNOSIS — R82998 Other abnormal findings in urine: Secondary | ICD-10-CM | POA: Diagnosis not present

## 2020-09-15 DIAGNOSIS — D631 Anemia in chronic kidney disease: Secondary | ICD-10-CM | POA: Diagnosis not present

## 2020-09-15 DIAGNOSIS — N186 End stage renal disease: Secondary | ICD-10-CM | POA: Diagnosis not present

## 2020-09-15 DIAGNOSIS — Z992 Dependence on renal dialysis: Secondary | ICD-10-CM | POA: Diagnosis not present

## 2020-09-15 DIAGNOSIS — N2581 Secondary hyperparathyroidism of renal origin: Secondary | ICD-10-CM | POA: Diagnosis not present

## 2020-09-15 DIAGNOSIS — E440 Moderate protein-calorie malnutrition: Secondary | ICD-10-CM | POA: Diagnosis not present

## 2020-09-15 DIAGNOSIS — Z79899 Other long term (current) drug therapy: Secondary | ICD-10-CM | POA: Diagnosis not present

## 2020-09-15 DIAGNOSIS — N2589 Other disorders resulting from impaired renal tubular function: Secondary | ICD-10-CM | POA: Diagnosis not present

## 2020-10-03 ENCOUNTER — Other Ambulatory Visit: Payer: PPO

## 2020-10-03 DIAGNOSIS — N186 End stage renal disease: Secondary | ICD-10-CM | POA: Diagnosis not present

## 2020-10-13 DIAGNOSIS — F5104 Psychophysiologic insomnia: Secondary | ICD-10-CM | POA: Diagnosis not present

## 2020-10-13 DIAGNOSIS — I5033 Acute on chronic diastolic (congestive) heart failure: Secondary | ICD-10-CM | POA: Diagnosis not present

## 2020-10-13 DIAGNOSIS — Z794 Long term (current) use of insulin: Secondary | ICD-10-CM | POA: Diagnosis not present

## 2020-10-13 DIAGNOSIS — E118 Type 2 diabetes mellitus with unspecified complications: Secondary | ICD-10-CM | POA: Diagnosis not present

## 2020-10-14 DIAGNOSIS — N186 End stage renal disease: Secondary | ICD-10-CM | POA: Diagnosis not present

## 2020-10-14 DIAGNOSIS — Z992 Dependence on renal dialysis: Secondary | ICD-10-CM | POA: Diagnosis not present

## 2020-10-14 DIAGNOSIS — E1165 Type 2 diabetes mellitus with hyperglycemia: Secondary | ICD-10-CM | POA: Diagnosis not present

## 2020-10-15 DIAGNOSIS — Z992 Dependence on renal dialysis: Secondary | ICD-10-CM | POA: Diagnosis not present

## 2020-10-15 DIAGNOSIS — N186 End stage renal disease: Secondary | ICD-10-CM | POA: Diagnosis not present

## 2020-10-15 DIAGNOSIS — R880 Cloudy (hemodialysis) (peritoneal) dialysis effluent: Secondary | ICD-10-CM | POA: Diagnosis not present

## 2020-10-15 DIAGNOSIS — D509 Iron deficiency anemia, unspecified: Secondary | ICD-10-CM | POA: Diagnosis not present

## 2020-10-15 DIAGNOSIS — E1129 Type 2 diabetes mellitus with other diabetic kidney complication: Secondary | ICD-10-CM | POA: Diagnosis not present

## 2020-10-15 DIAGNOSIS — E875 Hyperkalemia: Secondary | ICD-10-CM | POA: Diagnosis not present

## 2020-10-15 DIAGNOSIS — K650 Generalized (acute) peritonitis: Secondary | ICD-10-CM | POA: Diagnosis not present

## 2020-10-15 DIAGNOSIS — D631 Anemia in chronic kidney disease: Secondary | ICD-10-CM | POA: Diagnosis not present

## 2020-10-15 DIAGNOSIS — N2589 Other disorders resulting from impaired renal tubular function: Secondary | ICD-10-CM | POA: Diagnosis not present

## 2020-10-15 DIAGNOSIS — R82998 Other abnormal findings in urine: Secondary | ICD-10-CM | POA: Diagnosis not present

## 2020-10-15 DIAGNOSIS — N2581 Secondary hyperparathyroidism of renal origin: Secondary | ICD-10-CM | POA: Diagnosis not present

## 2020-10-15 DIAGNOSIS — R1084 Generalized abdominal pain: Secondary | ICD-10-CM | POA: Diagnosis not present

## 2020-11-04 DIAGNOSIS — E118 Type 2 diabetes mellitus with unspecified complications: Secondary | ICD-10-CM | POA: Diagnosis not present

## 2020-11-04 DIAGNOSIS — Z794 Long term (current) use of insulin: Secondary | ICD-10-CM | POA: Diagnosis not present

## 2020-11-13 DIAGNOSIS — E1165 Type 2 diabetes mellitus with hyperglycemia: Secondary | ICD-10-CM | POA: Diagnosis not present

## 2020-11-14 DIAGNOSIS — N186 End stage renal disease: Secondary | ICD-10-CM | POA: Diagnosis not present

## 2020-11-14 DIAGNOSIS — Z992 Dependence on renal dialysis: Secondary | ICD-10-CM | POA: Diagnosis not present

## 2020-11-15 DIAGNOSIS — N2581 Secondary hyperparathyroidism of renal origin: Secondary | ICD-10-CM | POA: Diagnosis not present

## 2020-11-15 DIAGNOSIS — R82998 Other abnormal findings in urine: Secondary | ICD-10-CM | POA: Diagnosis not present

## 2020-11-15 DIAGNOSIS — Z992 Dependence on renal dialysis: Secondary | ICD-10-CM | POA: Diagnosis not present

## 2020-11-15 DIAGNOSIS — D509 Iron deficiency anemia, unspecified: Secondary | ICD-10-CM | POA: Diagnosis not present

## 2020-11-15 DIAGNOSIS — N2589 Other disorders resulting from impaired renal tubular function: Secondary | ICD-10-CM | POA: Diagnosis not present

## 2020-11-15 DIAGNOSIS — D631 Anemia in chronic kidney disease: Secondary | ICD-10-CM | POA: Diagnosis not present

## 2020-11-15 DIAGNOSIS — N186 End stage renal disease: Secondary | ICD-10-CM | POA: Diagnosis not present

## 2020-11-15 DIAGNOSIS — Z79899 Other long term (current) drug therapy: Secondary | ICD-10-CM | POA: Diagnosis not present

## 2020-11-15 DIAGNOSIS — K650 Generalized (acute) peritonitis: Secondary | ICD-10-CM | POA: Diagnosis not present

## 2020-11-15 DIAGNOSIS — E440 Moderate protein-calorie malnutrition: Secondary | ICD-10-CM | POA: Diagnosis not present

## 2020-12-04 DIAGNOSIS — Z1152 Encounter for screening for COVID-19: Secondary | ICD-10-CM | POA: Diagnosis not present

## 2020-12-08 DIAGNOSIS — Z794 Long term (current) use of insulin: Secondary | ICD-10-CM | POA: Diagnosis not present

## 2020-12-08 DIAGNOSIS — E538 Deficiency of other specified B group vitamins: Secondary | ICD-10-CM | POA: Diagnosis not present

## 2020-12-08 DIAGNOSIS — I739 Peripheral vascular disease, unspecified: Secondary | ICD-10-CM | POA: Diagnosis not present

## 2020-12-08 DIAGNOSIS — E118 Type 2 diabetes mellitus with unspecified complications: Secondary | ICD-10-CM | POA: Diagnosis not present

## 2020-12-09 DIAGNOSIS — E1165 Type 2 diabetes mellitus with hyperglycemia: Secondary | ICD-10-CM | POA: Diagnosis not present

## 2020-12-10 DIAGNOSIS — E118 Type 2 diabetes mellitus with unspecified complications: Secondary | ICD-10-CM | POA: Diagnosis not present

## 2020-12-10 DIAGNOSIS — I739 Peripheral vascular disease, unspecified: Secondary | ICD-10-CM | POA: Diagnosis not present

## 2020-12-10 DIAGNOSIS — I5033 Acute on chronic diastolic (congestive) heart failure: Secondary | ICD-10-CM | POA: Diagnosis not present

## 2020-12-10 DIAGNOSIS — K3184 Gastroparesis: Secondary | ICD-10-CM | POA: Diagnosis not present

## 2020-12-10 DIAGNOSIS — Z794 Long term (current) use of insulin: Secondary | ICD-10-CM | POA: Diagnosis not present

## 2020-12-10 DIAGNOSIS — J431 Panlobular emphysema: Secondary | ICD-10-CM | POA: Diagnosis not present

## 2020-12-10 DIAGNOSIS — E1143 Type 2 diabetes mellitus with diabetic autonomic (poly)neuropathy: Secondary | ICD-10-CM | POA: Diagnosis not present

## 2020-12-10 DIAGNOSIS — I700 Atherosclerosis of aorta: Secondary | ICD-10-CM | POA: Diagnosis not present

## 2020-12-10 DIAGNOSIS — Z0000 Encounter for general adult medical examination without abnormal findings: Secondary | ICD-10-CM | POA: Diagnosis not present

## 2020-12-15 DIAGNOSIS — E1165 Type 2 diabetes mellitus with hyperglycemia: Secondary | ICD-10-CM | POA: Diagnosis not present

## 2020-12-15 DIAGNOSIS — Z992 Dependence on renal dialysis: Secondary | ICD-10-CM | POA: Diagnosis not present

## 2020-12-15 DIAGNOSIS — N186 End stage renal disease: Secondary | ICD-10-CM | POA: Diagnosis not present

## 2020-12-16 DIAGNOSIS — K650 Generalized (acute) peritonitis: Secondary | ICD-10-CM | POA: Diagnosis not present

## 2020-12-16 DIAGNOSIS — R82998 Other abnormal findings in urine: Secondary | ICD-10-CM | POA: Diagnosis not present

## 2020-12-16 DIAGNOSIS — N2581 Secondary hyperparathyroidism of renal origin: Secondary | ICD-10-CM | POA: Diagnosis not present

## 2020-12-16 DIAGNOSIS — N186 End stage renal disease: Secondary | ICD-10-CM | POA: Diagnosis not present

## 2020-12-16 DIAGNOSIS — D631 Anemia in chronic kidney disease: Secondary | ICD-10-CM | POA: Diagnosis not present

## 2020-12-16 DIAGNOSIS — Z79899 Other long term (current) drug therapy: Secondary | ICD-10-CM | POA: Diagnosis not present

## 2020-12-16 DIAGNOSIS — R1084 Generalized abdominal pain: Secondary | ICD-10-CM | POA: Diagnosis not present

## 2020-12-16 DIAGNOSIS — E440 Moderate protein-calorie malnutrition: Secondary | ICD-10-CM | POA: Diagnosis not present

## 2020-12-16 DIAGNOSIS — N2589 Other disorders resulting from impaired renal tubular function: Secondary | ICD-10-CM | POA: Diagnosis not present

## 2020-12-16 DIAGNOSIS — Z992 Dependence on renal dialysis: Secondary | ICD-10-CM | POA: Diagnosis not present

## 2020-12-16 DIAGNOSIS — D509 Iron deficiency anemia, unspecified: Secondary | ICD-10-CM | POA: Diagnosis not present

## 2020-12-17 DIAGNOSIS — F5104 Psychophysiologic insomnia: Secondary | ICD-10-CM | POA: Diagnosis not present

## 2020-12-17 DIAGNOSIS — E1143 Type 2 diabetes mellitus with diabetic autonomic (poly)neuropathy: Secondary | ICD-10-CM | POA: Diagnosis not present

## 2020-12-17 DIAGNOSIS — K3184 Gastroparesis: Secondary | ICD-10-CM | POA: Diagnosis not present

## 2020-12-31 ENCOUNTER — Inpatient Hospital Stay
Admission: EM | Admit: 2020-12-31 | Discharge: 2021-01-09 | DRG: 480 | Disposition: A | Discharge status: Skilled Nursing Facility | Payer: PPO | Attending: Internal Medicine | Admitting: Internal Medicine

## 2020-12-31 ENCOUNTER — Emergency Department: Payer: PPO

## 2020-12-31 DIAGNOSIS — E669 Obesity, unspecified: Secondary | ICD-10-CM | POA: Diagnosis present

## 2020-12-31 DIAGNOSIS — I70212 Atherosclerosis of native arteries of extremities with intermittent claudication, left leg: Secondary | ICD-10-CM | POA: Diagnosis not present

## 2020-12-31 DIAGNOSIS — E118 Type 2 diabetes mellitus with unspecified complications: Secondary | ICD-10-CM | POA: Diagnosis not present

## 2020-12-31 DIAGNOSIS — N183 Chronic kidney disease, stage 3 unspecified: Secondary | ICD-10-CM | POA: Diagnosis present

## 2020-12-31 DIAGNOSIS — F411 Generalized anxiety disorder: Secondary | ICD-10-CM | POA: Diagnosis present

## 2020-12-31 DIAGNOSIS — Z825 Family history of asthma and other chronic lower respiratory diseases: Secondary | ICD-10-CM

## 2020-12-31 DIAGNOSIS — Z992 Dependence on renal dialysis: Secondary | ICD-10-CM

## 2020-12-31 DIAGNOSIS — K5900 Constipation, unspecified: Secondary | ICD-10-CM | POA: Diagnosis not present

## 2020-12-31 DIAGNOSIS — D631 Anemia in chronic kidney disease: Secondary | ICD-10-CM | POA: Diagnosis present

## 2020-12-31 DIAGNOSIS — Y92012 Bathroom of single-family (private) house as the place of occurrence of the external cause: Secondary | ICD-10-CM

## 2020-12-31 DIAGNOSIS — Z905 Acquired absence of kidney: Secondary | ICD-10-CM

## 2020-12-31 DIAGNOSIS — Z794 Long term (current) use of insulin: Secondary | ICD-10-CM | POA: Diagnosis not present

## 2020-12-31 DIAGNOSIS — Z20822 Contact with and (suspected) exposure to covid-19: Secondary | ICD-10-CM | POA: Diagnosis not present

## 2020-12-31 DIAGNOSIS — I739 Peripheral vascular disease, unspecified: Secondary | ICD-10-CM | POA: Diagnosis not present

## 2020-12-31 DIAGNOSIS — Z833 Family history of diabetes mellitus: Secondary | ICD-10-CM

## 2020-12-31 DIAGNOSIS — S72002D Fracture of unspecified part of neck of left femur, subsequent encounter for closed fracture with routine healing: Secondary | ICD-10-CM | POA: Diagnosis not present

## 2020-12-31 DIAGNOSIS — E1142 Type 2 diabetes mellitus with diabetic polyneuropathy: Secondary | ICD-10-CM | POA: Diagnosis present

## 2020-12-31 DIAGNOSIS — E785 Hyperlipidemia, unspecified: Secondary | ICD-10-CM | POA: Diagnosis present

## 2020-12-31 DIAGNOSIS — R001 Bradycardia, unspecified: Secondary | ICD-10-CM | POA: Diagnosis not present

## 2020-12-31 DIAGNOSIS — M6281 Muscle weakness (generalized): Secondary | ICD-10-CM | POA: Diagnosis not present

## 2020-12-31 DIAGNOSIS — S72142A Displaced intertrochanteric fracture of left femur, initial encounter for closed fracture: Secondary | ICD-10-CM | POA: Diagnosis not present

## 2020-12-31 DIAGNOSIS — F419 Anxiety disorder, unspecified: Secondary | ICD-10-CM | POA: Diagnosis not present

## 2020-12-31 DIAGNOSIS — E876 Hypokalemia: Secondary | ICD-10-CM | POA: Diagnosis not present

## 2020-12-31 DIAGNOSIS — Z96653 Presence of artificial knee joint, bilateral: Secondary | ICD-10-CM | POA: Diagnosis present

## 2020-12-31 DIAGNOSIS — I2583 Coronary atherosclerosis due to lipid rich plaque: Secondary | ICD-10-CM | POA: Diagnosis not present

## 2020-12-31 DIAGNOSIS — E1122 Type 2 diabetes mellitus with diabetic chronic kidney disease: Secondary | ICD-10-CM | POA: Diagnosis present

## 2020-12-31 DIAGNOSIS — E1143 Type 2 diabetes mellitus with diabetic autonomic (poly)neuropathy: Secondary | ICD-10-CM | POA: Diagnosis not present

## 2020-12-31 DIAGNOSIS — S7292XA Unspecified fracture of left femur, initial encounter for closed fracture: Secondary | ICD-10-CM | POA: Diagnosis not present

## 2020-12-31 DIAGNOSIS — Z66 Do not resuscitate: Secondary | ICD-10-CM | POA: Diagnosis present

## 2020-12-31 DIAGNOSIS — I129 Hypertensive chronic kidney disease with stage 1 through stage 4 chronic kidney disease, or unspecified chronic kidney disease: Secondary | ICD-10-CM | POA: Diagnosis not present

## 2020-12-31 DIAGNOSIS — IMO0002 Reserved for concepts with insufficient information to code with codable children: Secondary | ICD-10-CM | POA: Diagnosis present

## 2020-12-31 DIAGNOSIS — S72009A Fracture of unspecified part of neck of unspecified femur, initial encounter for closed fracture: Secondary | ICD-10-CM | POA: Diagnosis present

## 2020-12-31 DIAGNOSIS — J449 Chronic obstructive pulmonary disease, unspecified: Secondary | ICD-10-CM | POA: Diagnosis not present

## 2020-12-31 DIAGNOSIS — Z8249 Family history of ischemic heart disease and other diseases of the circulatory system: Secondary | ICD-10-CM

## 2020-12-31 DIAGNOSIS — F17210 Nicotine dependence, cigarettes, uncomplicated: Secondary | ICD-10-CM | POA: Diagnosis not present

## 2020-12-31 DIAGNOSIS — R0602 Shortness of breath: Secondary | ICD-10-CM | POA: Diagnosis not present

## 2020-12-31 DIAGNOSIS — Z87891 Personal history of nicotine dependence: Secondary | ICD-10-CM | POA: Diagnosis not present

## 2020-12-31 DIAGNOSIS — M797 Fibromyalgia: Secondary | ICD-10-CM | POA: Diagnosis not present

## 2020-12-31 DIAGNOSIS — G4700 Insomnia, unspecified: Secondary | ICD-10-CM | POA: Diagnosis not present

## 2020-12-31 DIAGNOSIS — I491 Atrial premature depolarization: Secondary | ICD-10-CM | POA: Diagnosis not present

## 2020-12-31 DIAGNOSIS — N186 End stage renal disease: Secondary | ICD-10-CM | POA: Diagnosis present

## 2020-12-31 DIAGNOSIS — Z9049 Acquired absence of other specified parts of digestive tract: Secondary | ICD-10-CM

## 2020-12-31 DIAGNOSIS — K3184 Gastroparesis: Secondary | ICD-10-CM | POA: Diagnosis present

## 2020-12-31 DIAGNOSIS — I5032 Chronic diastolic (congestive) heart failure: Secondary | ICD-10-CM | POA: Diagnosis present

## 2020-12-31 DIAGNOSIS — Y9389 Activity, other specified: Secondary | ICD-10-CM

## 2020-12-31 DIAGNOSIS — I132 Hypertensive heart and chronic kidney disease with heart failure and with stage 5 chronic kidney disease, or end stage renal disease: Secondary | ICD-10-CM | POA: Diagnosis not present

## 2020-12-31 DIAGNOSIS — R5381 Other malaise: Secondary | ICD-10-CM | POA: Diagnosis not present

## 2020-12-31 DIAGNOSIS — W010XXA Fall on same level from slipping, tripping and stumbling without subsequent striking against object, initial encounter: Secondary | ICD-10-CM | POA: Diagnosis present

## 2020-12-31 DIAGNOSIS — K219 Gastro-esophageal reflux disease without esophagitis: Secondary | ICD-10-CM | POA: Diagnosis not present

## 2020-12-31 DIAGNOSIS — I70219 Atherosclerosis of native arteries of extremities with intermittent claudication, unspecified extremity: Secondary | ICD-10-CM | POA: Diagnosis present

## 2020-12-31 DIAGNOSIS — E1151 Type 2 diabetes mellitus with diabetic peripheral angiopathy without gangrene: Secondary | ICD-10-CM | POA: Diagnosis present

## 2020-12-31 DIAGNOSIS — E119 Type 2 diabetes mellitus without complications: Secondary | ICD-10-CM | POA: Diagnosis present

## 2020-12-31 DIAGNOSIS — Z800 Family history of malignant neoplasm of digestive organs: Secondary | ICD-10-CM

## 2020-12-31 DIAGNOSIS — Z90710 Acquired absence of both cervix and uterus: Secondary | ICD-10-CM

## 2020-12-31 DIAGNOSIS — W19XXXA Unspecified fall, initial encounter: Secondary | ICD-10-CM

## 2020-12-31 DIAGNOSIS — E1165 Type 2 diabetes mellitus with hyperglycemia: Secondary | ICD-10-CM | POA: Diagnosis present

## 2020-12-31 DIAGNOSIS — Z79899 Other long term (current) drug therapy: Secondary | ICD-10-CM

## 2020-12-31 DIAGNOSIS — I130 Hypertensive heart and chronic kidney disease with heart failure and stage 1 through stage 4 chronic kidney disease, or unspecified chronic kidney disease: Secondary | ICD-10-CM | POA: Diagnosis not present

## 2020-12-31 DIAGNOSIS — I2510 Atherosclerotic heart disease of native coronary artery without angina pectoris: Secondary | ICD-10-CM | POA: Diagnosis present

## 2020-12-31 DIAGNOSIS — Z8042 Family history of malignant neoplasm of prostate: Secondary | ICD-10-CM

## 2020-12-31 DIAGNOSIS — N185 Chronic kidney disease, stage 5: Secondary | ICD-10-CM | POA: Diagnosis not present

## 2020-12-31 DIAGNOSIS — K589 Irritable bowel syndrome without diarrhea: Secondary | ICD-10-CM | POA: Diagnosis not present

## 2020-12-31 DIAGNOSIS — E1140 Type 2 diabetes mellitus with diabetic neuropathy, unspecified: Secondary | ICD-10-CM | POA: Diagnosis not present

## 2020-12-31 DIAGNOSIS — N2581 Secondary hyperparathyroidism of renal origin: Secondary | ICD-10-CM | POA: Diagnosis not present

## 2020-12-31 DIAGNOSIS — Z01810 Encounter for preprocedural cardiovascular examination: Secondary | ICD-10-CM | POA: Diagnosis not present

## 2020-12-31 DIAGNOSIS — W19XXXD Unspecified fall, subsequent encounter: Secondary | ICD-10-CM | POA: Diagnosis not present

## 2020-12-31 DIAGNOSIS — R0902 Hypoxemia: Secondary | ICD-10-CM | POA: Diagnosis not present

## 2020-12-31 DIAGNOSIS — Z419 Encounter for procedure for purposes other than remedying health state, unspecified: Secondary | ICD-10-CM

## 2020-12-31 DIAGNOSIS — Z96652 Presence of left artificial knee joint: Secondary | ICD-10-CM

## 2020-12-31 DIAGNOSIS — J4489 Other specified chronic obstructive pulmonary disease: Secondary | ICD-10-CM | POA: Diagnosis present

## 2020-12-31 DIAGNOSIS — Z96651 Presence of right artificial knee joint: Secondary | ICD-10-CM

## 2020-12-31 DIAGNOSIS — I10 Essential (primary) hypertension: Secondary | ICD-10-CM | POA: Diagnosis not present

## 2020-12-31 DIAGNOSIS — S72002A Fracture of unspecified part of neck of left femur, initial encounter for closed fracture: Secondary | ICD-10-CM | POA: Diagnosis not present

## 2020-12-31 DIAGNOSIS — Z9889 Other specified postprocedural states: Secondary | ICD-10-CM | POA: Diagnosis not present

## 2020-12-31 DIAGNOSIS — F32A Depression, unspecified: Secondary | ICD-10-CM | POA: Diagnosis present

## 2020-12-31 DIAGNOSIS — Z6831 Body mass index (BMI) 31.0-31.9, adult: Secondary | ICD-10-CM

## 2020-12-31 DIAGNOSIS — M8580 Other specified disorders of bone density and structure, unspecified site: Secondary | ICD-10-CM | POA: Diagnosis present

## 2020-12-31 DIAGNOSIS — R279 Unspecified lack of coordination: Secondary | ICD-10-CM | POA: Diagnosis not present

## 2020-12-31 DIAGNOSIS — I959 Hypotension, unspecified: Secondary | ICD-10-CM | POA: Diagnosis not present

## 2020-12-31 LAB — TROPONIN I (HIGH SENSITIVITY)
Troponin I (High Sensitivity): 33 ng/L — ABNORMAL HIGH (ref <18)
Troponin I (High Sensitivity): 34 ng/L — ABNORMAL HIGH (ref <18)

## 2020-12-31 LAB — CBC WITH DIFFERENTIAL/PLATELET
Abs Immature Granulocytes: 0.09 10*3/uL — ABNORMAL HIGH (ref 0.00–0.07)
Basophils Absolute: 0 10*3/uL (ref 0.0–0.1)
Basophils Relative: 0 %
Eosinophils Absolute: 0 10*3/uL (ref 0.0–0.5)
Eosinophils Relative: 0 %
HCT: 40.3 % (ref 36.0–46.0)
Hemoglobin: 13.1 g/dL (ref 12.0–15.0)
Immature Granulocytes: 1 %
Lymphocytes Relative: 17 %
Lymphs Abs: 1.6 10*3/uL (ref 0.7–4.0)
MCH: 30.9 pg (ref 26.0–34.0)
MCHC: 32.5 g/dL (ref 30.0–36.0)
MCV: 95 fL (ref 80.0–100.0)
Monocytes Absolute: 0.7 10*3/uL (ref 0.1–1.0)
Monocytes Relative: 8 %
Neutro Abs: 6.8 10*3/uL (ref 1.7–7.7)
Neutrophils Relative %: 74 %
Platelets: 244 10*3/uL (ref 150–400)
RBC: 4.24 MIL/uL (ref 3.87–5.11)
RDW: 13.2 % (ref 11.5–15.5)
WBC: 9.2 10*3/uL (ref 4.0–10.5)
nRBC: 0 % (ref 0.0–0.2)

## 2020-12-31 LAB — COMPREHENSIVE METABOLIC PANEL
ALT: 16 U/L (ref 0–44)
AST: 24 U/L (ref 15–41)
Albumin: 2.5 g/dL — ABNORMAL LOW (ref 3.5–5.0)
Alkaline Phosphatase: 41 U/L (ref 38–126)
Anion gap: 11 (ref 5–15)
BUN: 42 mg/dL — ABNORMAL HIGH (ref 8–23)
CO2: 26 mmol/L (ref 22–32)
Calcium: 8.6 mg/dL — ABNORMAL LOW (ref 8.9–10.3)
Chloride: 96 mmol/L — ABNORMAL LOW (ref 98–111)
Creatinine, Ser: 5.96 mg/dL — ABNORMAL HIGH (ref 0.44–1.00)
GFR, Estimated: 7 mL/min — ABNORMAL LOW (ref >60)
Glucose, Bld: 226 mg/dL — ABNORMAL HIGH (ref 70–99)
Potassium: 3 mmol/L — ABNORMAL LOW (ref 3.5–5.1)
Sodium: 133 mmol/L — ABNORMAL LOW (ref 135–145)
Total Bilirubin: 0.5 mg/dL (ref 0.3–1.2)
Total Protein: 6.5 g/dL (ref 6.5–8.1)

## 2020-12-31 LAB — RESP PANEL BY RT-PCR (FLU A&B, COVID) ARPGX2
Influenza A by PCR: NEGATIVE
Influenza B by PCR: NEGATIVE
SARS Coronavirus 2 by RT PCR: NEGATIVE

## 2020-12-31 LAB — TYPE AND SCREEN
ABO/RH(D): A POS
Antibody Screen: NEGATIVE

## 2020-12-31 MED ORDER — HYDROMORPHONE HCL 1 MG/ML IJ SOLN
1.0000 mg | Freq: Once | INTRAMUSCULAR | Status: AC
  Administered 2020-12-31: 1 mg via INTRAVENOUS
  Filled 2020-12-31: qty 1

## 2020-12-31 MED ORDER — ALPRAZOLAM 0.5 MG PO TABS
0.5000 mg | ORAL_TABLET | Freq: Every evening | ORAL | Status: DC | PRN
  Administered 2021-01-03 – 2021-01-04 (×2): 0.5 mg via ORAL
  Filled 2020-12-31 (×2): qty 1

## 2020-12-31 MED ORDER — ATORVASTATIN CALCIUM 20 MG PO TABS: 20.0000 mg | ORAL_TABLET | Freq: Every day | ORAL | Status: DC

## 2020-12-31 MED ORDER — SODIUM CHLORIDE 0.9 % IV BOLUS
1000.0000 mL | Freq: Once | INTRAVENOUS | Status: AC
  Administered 2020-12-31: 1000 mL via INTRAVENOUS

## 2020-12-31 MED ORDER — HYDROXYZINE HCL 25 MG PO TABS
25.0000 mg | ORAL_TABLET | Freq: Two times a day (BID) | ORAL | Status: DC | PRN
  Filled 2020-12-31: qty 1

## 2020-12-31 MED ORDER — ONDANSETRON HCL 4 MG/2ML IJ SOLN
4.0000 mg | Freq: Once | INTRAMUSCULAR | Status: AC
  Administered 2020-12-31: 4 mg via INTRAVENOUS

## 2020-12-31 MED ORDER — TRANEXAMIC ACID-NACL 1000-0.7 MG/100ML-% IV SOLN
1000.0000 mg | INTRAVENOUS | Status: AC
  Filled 2020-12-31: qty 100

## 2020-12-31 MED ORDER — PANTOPRAZOLE SODIUM 40 MG PO TBEC
40.0000 mg | DELAYED_RELEASE_TABLET | Freq: Two times a day (BID) | ORAL | Status: DC
  Administered 2021-01-01 – 2021-01-09 (×16): 40 mg via ORAL
  Filled 2020-12-31 (×17): qty 1

## 2020-12-31 MED ORDER — FUROSEMIDE 40 MG PO TABS
80.0000 mg | ORAL_TABLET | Freq: Every day | ORAL | Status: DC
  Administered 2021-01-01 – 2021-01-09 (×9): 80 mg via ORAL
  Filled 2020-12-31 (×9): qty 2

## 2020-12-31 MED ORDER — HEPARIN SODIUM (PORCINE) 5000 UNIT/ML IJ SOLN
5000.0000 [IU] | Freq: Three times a day (TID) | INTRAMUSCULAR | Status: DC
  Administered 2020-12-31 – 2021-01-09 (×23): 5000 [IU] via SUBCUTANEOUS
  Filled 2020-12-31 (×24): qty 1

## 2020-12-31 MED ORDER — FENTANYL CITRATE (PF) 100 MCG/2ML IJ SOLN
50.0000 ug | INTRAMUSCULAR | Status: DC | PRN
  Administered 2020-12-31 – 2021-01-01 (×6): 50 ug via INTRAVENOUS
  Filled 2020-12-31 (×7): qty 2

## 2020-12-31 MED ORDER — FENTANYL CITRATE (PF) 100 MCG/2ML IJ SOLN
50.0000 ug | Freq: Once | INTRAMUSCULAR | Status: AC
  Administered 2020-12-31: 50 ug via INTRAVENOUS
  Filled 2020-12-31: qty 2

## 2020-12-31 MED ORDER — CLINDAMYCIN PHOSPHATE 600 MG/50ML IV SOLN
600.0000 mg | INTRAVENOUS | Status: AC
  Administered 2021-01-01: 600 mg via INTRAVENOUS
  Filled 2020-12-31: qty 50

## 2020-12-31 MED ORDER — INSULIN ASPART 100 UNIT/ML ~~LOC~~ SOLN
0.0000 [IU] | SUBCUTANEOUS | Status: DC
  Administered 2021-01-01: 2 [IU] via SUBCUTANEOUS
  Administered 2021-01-01: 1 [IU] via SUBCUTANEOUS
  Administered 2021-01-01 (×2): 2 [IU] via SUBCUTANEOUS
  Administered 2021-01-02: 5 [IU] via SUBCUTANEOUS
  Administered 2021-01-02 (×2): 4 [IU] via SUBCUTANEOUS
  Administered 2021-01-02: 3 [IU] via SUBCUTANEOUS
  Filled 2020-12-31 (×7): qty 1

## 2020-12-31 MED ORDER — METOCLOPRAMIDE HCL 10 MG PO TABS
5.0000 mg | ORAL_TABLET | Freq: Every day | ORAL | Status: DC
  Administered 2021-01-01 – 2021-01-09 (×37): 5 mg via ORAL
  Filled 2020-12-31 (×36): qty 1

## 2020-12-31 MED ORDER — CARVEDILOL 25 MG PO TABS
25.0000 mg | ORAL_TABLET | Freq: Two times a day (BID) | ORAL | Status: DC
  Administered 2021-01-01 – 2021-01-09 (×16): 25 mg via ORAL
  Filled 2020-12-31 (×18): qty 1

## 2020-12-31 MED ORDER — INSULIN DETEMIR 100 UNIT/ML ~~LOC~~ SOLN
10.0000 [IU] | Freq: Two times a day (BID) | SUBCUTANEOUS | Status: DC
  Administered 2021-01-01 – 2021-01-02 (×4): 10 [IU] via SUBCUTANEOUS
  Filled 2020-12-31 (×5): qty 0.1

## 2020-12-31 MED ORDER — POLYETHYLENE GLYCOL 3350 17 G PO PACK
17.0000 g | PACK | Freq: Every day | ORAL | Status: DC | PRN
  Administered 2021-01-04: 17 g via ORAL
  Filled 2020-12-31: qty 1

## 2020-12-31 MED ORDER — ATORVASTATIN CALCIUM 20 MG PO TABS
20.0000 mg | ORAL_TABLET | Freq: Every day | ORAL | Status: DC
  Administered 2020-12-31 – 2021-01-09 (×10): 20 mg via ORAL
  Filled 2020-12-31 (×10): qty 1

## 2020-12-31 MED ORDER — TRAZODONE HCL 50 MG PO TABS
25.0000 mg | ORAL_TABLET | Freq: Every evening | ORAL | Status: DC | PRN
  Administered 2020-12-31 – 2021-01-03 (×3): 50 mg via ORAL
  Administered 2021-01-07: 25 mg via ORAL
  Administered 2021-01-08: 50 mg via ORAL
  Filled 2020-12-31 (×5): qty 1

## 2020-12-31 NOTE — ED Notes (Signed)
Received confirmation that receiving RN is ready for patient. Patient transported off floor with primary RN.

## 2020-12-31 NOTE — ED Triage Notes (Signed)
Pt tripped on rug and had mechanical fall, no loc, no thinners, left hip pain

## 2020-12-31 NOTE — ED Notes (Signed)
Admitting physician at bedside

## 2020-12-31 NOTE — Consult Note (Addendum)
Full consult note to follow. Plan left hip fracture repair tomorrow if cleared by hospitalist.   ORTHOPAEDIC CONSULTATION  REQUESTING PHYSICIAN: No att. providers found  Chief Complaint: left hip pain  HPI: Andrea Stewart is a 74 y.o. female who complains of left hip pain. Please see H&P and ED notes for details. Denies any numbness, tingling or constitutional symptoms.  Past Medical History:  Diagnosis Date  . Backache, unspecified    s/p c-spine and l-spine fusions  . COPD (chronic obstructive pulmonary disease) ([place])   . Degeneration of intervertebral disc, site unspecified   . Depressive disorder, not elsewhere classified   . Disorder of bone and cartilage, unspecified   . Family history of adverse reaction to anesthesia    mother did, but patient not sure of the complication  . HTN (hypertension)   . Hx of left heart catheterization by cutdown    a. 7 years ago; no significant cad; b. Lexiscan 2014: no significant ischemia, no significant EKG changes concerning for ischemia, EF 67%, overall low risk study  . Kidney failure    Dr Cyndia Diver  . Myalgia and myositis, unspecified   . Obesity, unspecified   . Personal history of tobacco use, presenting hazards to health    1/2 ppd  . Plantar fascial fibromatosis   . S/P cholecystectomy   . S/p nephrectomy   . Type II or unspecified type diabetes mellitus without mention of complication, not stated as uncontrolled   . Unspecified essential hypertension   . Unspecified hereditary and idiopathic peripheral neuropathy    secondary to diabetes   Past Surgical History:  Procedure Laterality Date  . ABDOMINAL HYSTERECTOMY    . APPENDECTOMY    . BACK SURGERY     1966 and 2006   . CARDIAC CATHETERIZATION     o.k. 2003  . CARPAL TUNNEL RELEASE    . CERVICAL DISCECTOMY    . CHOLECYSTECTOMY    . COLONOSCOPY  09/1999   colonoscopy-hemorrhoids, re-check 5 years (10/2006)  . dexa     osteopenia (01/2004)  . DIALYSIS/PERMA  CATHETER INSERTION Right 01/05/2021   Procedure: DIALYSIS/PERMA CATHETER INSERTION;  Surgeon: Algernon Huxley, MD;  Location: [facility];  Service: Cardiovascular;  Laterality: Right;  . ELBOW SURGERY    . ESOPHAGOGASTRODUODENOSCOPY (EGD) WITH PROPOFOL N/A 07/23/2020   Procedure: ESOPHAGOGASTRODUODENOSCOPY (EGD) WITH PROPOFOL;  Surgeon: Robert Bellow, MD;  Location: [facility];  Service: Endoscopy;  Laterality: N/A;  TRAVEL TO O.R. - PATIENT TO HAVE SURGERY  . FEMUR IM NAIL Left 01/01/2021   Procedure: INTRAMEDULLARY (IM) NAIL FEMORAL;  Surgeon: Lovell Sheehan, MD;  Location: [facility];  Service: Orthopedics;  Laterality: Left;  . FOOT SURGERY     x 3  . JOINT REPLACEMENT    . KIDNEY DONATION  1991  . LAPAROSCOPIC ABDOMINAL EXPLORATION N/A 07/23/2020   Procedure: LAPAROSCOPIC ABDOMINAL EXPLORATION;  Surgeon: Robert Bellow, MD;  Location: [facility];  Service: General;  Laterality: N/A;  . LUMBAR FUSION    . NECK SURGERY     06/2005  . Problem with general Anesthesia     02/2006  . REPLACEMENT TOTAL KNEE  10/2004  . trigger finger surgery    . UPPER GI ENDOSCOPY N/A 07/23/2020   Procedure: UPPER GI ENDOSCOPY;  Surgeon: Robert Bellow, MD;  Location: [facility];  Service: General;  Laterality: N/A;  . VENTRAL HERNIA REPAIR N/A 09/08/2017   Primary repair of a 10 x 15 mm infraumbilical fascial  defect.  Surgeon: Robert Bellow, MD;  Location: [facility];  Service: General;  Laterality: N/A;  . VESICOVAGINAL FISTULA CLOSURE W/ TAH    . VIDEO BRONCHOSCOPY Bilateral 04/02/2015   Procedure: VIDEO BRONCHOSCOPY WITHOUT FLUORO;  Surgeon: Juanito Doom, MD;  [facility];  Service: Cardiopulmonary;  Laterality: Bilateral;   Social History   Socioeconomic History  . Marital status: Married    Spouse name: Not on file  . Number of children: 4  . Years of education: Not on file  . Highest education level: Not on file  Occupational History  . Occupation: Retired   Tobacco Use  . Smoking status: Current Every Day Smoker    Packs/day: 0.25    Years: 30.00    Pack years: 7.50    Types: Cigarettes  . Smokeless tobacco: Never Used  . Tobacco comment: currently smoking .25 ppd, trying to quit 02/13/16  Substance and Sexual Activity  . Alcohol use: No    Alcohol/week: 0.0 standard drinks  . Drug use: No  . Sexual activity: Never  Other Topics Concern  . Not on file  Social History Narrative   Lives in [place]. Disability because of back   Social Determinants of Health   Financial Resource Strain: Not on file  Food Insecurity: Not on file  Transportation Needs: Not on file  Physical Activity: Not on file  Stress: Not on file  Social Connections: Not on file   Family History  Problem Relation Age of Onset  . Hypertension Mother   . Colon cancer Mother   . Cancer Mother        colon  . Coronary artery disease Mother   . Diabetes Mother   . Prostate cancer Father   . COPD Father   . Cancer Father        prostate  . Heart attack Brother 49  . Sleep apnea Brother   . Diabetes Brother   . Diabetes Paternal Aunt   . Diabetes Paternal Uncle   . Cancer Daughter        Brain, lungs, liver, spine, kidney   Allergies  Allergen Reactions  . Morphine Anaphylaxis  . Prednisone Other (See Comments)    "makes me think I am having a heart attack"  . Gabapentin Other (See Comments)    intolerance  . Hydralazine Itching  . Nsaids Diarrhea  . Penicillins Swelling    TOLERATED CEFAZOLIN 07/2020 Has patient had a PCN reaction causing immediate rash, facial/tongue/throat swelling, SOB or lightheadedness with hypotension: Yes Has patient had a PCN reaction causing severe rash involving mucus membranes or skin necrosis: No Has patient had a PCN reaction that required hospitalization: No Has patient had a PCN reaction occurring within the last 10 years: No If all of the above answers are "NO", then may proceed with Cephalosporin use.    Prior  to Admission medications   Medication Sig Start Date End Date Taking? Authorizing Provider  atorvastatin (LIPITOR) 20 MG tablet TAKE 1 TABLET BY MOUTH DAILY Patient taking differently: Take 20 mg by mouth daily with supper. 08/28/19  Yes Crecencio Mc, MD  calcium acetate (PHOSLO) 667 MG capsule Take 1,334 mg by mouth 2 (two) times daily. 10/20/20  Yes [provider]  carvedilol (COREG) 25 MG tablet Take 25 mg by mouth 2 (two) times daily. 06/23/20  Yes [provider]  furosemide (LASIX) 80 MG tablet Take 80 mg by mouth daily. 06/30/20  Yes [provider]  Probiotic Product (  PROBIOTIC PO) Take 1 capsule by mouth daily.   Yes [provider]  REGLAN 5 MG tablet Take 5 mg by mouth 5 (five) times daily. 12/25/20  Yes [provider]  sevelamer carbonate (RENVELA) 800 MG tablet Take 1,600 mg by mouth 3 (three) times daily. 10/03/20  Yes [provider]  traZODone (DESYREL) 50 MG tablet TAKE ONE-HALF TO ONE TABLET BY MOUTH AT BEDTIME AS NEEDED FOR SLEEP Patient taking differently: Take 25-50 mg by mouth at bedtime as needed for sleep. 08/06/20  Yes Crecencio Mc, MD  acetaminophen (TYLENOL) 325 MG tablet Take 2 tablets (650 mg total) by mouth every 6 (six) hours as needed for mild pain or headache (headache). 01/09/21   Ezekiel Slocumb, DO  aspirin EC 81 MG EC tablet Take 1 tablet (81 mg total) by mouth 2 (two) times daily. Swallow whole. 01/09/21   Ezekiel Slocumb, DO  gentamicin cream (GARAMYCIN) 0.1 % Apply topically. 07/29/20   [provider]  hydrOXYzine (ATARAX/VISTARIL) 25 MG tablet Take 1 tablet (25 mg total) by mouth 2 (two) times daily as needed for anxiety. 01/09/21   Nicole Kindred A, DO  insulin aspart (NOVOLOG) 100 UNIT/ML injection Inject 5 Units into the skin 3 (three) times daily with meals. 01/09/21   Nicole Kindred A, DO  insulin aspart (NOVOLOG) 100 UNIT/ML injection Inject 0-9 Units into the skin 4 (four) times daily -   before meals and at bedtime. According to sliding scale as follows: If CBG 70-120: 0 units If CBG 121-150: 1 unit If CBG 151-200: 2 units If CBG 201-250: 3 units If CBG 251-300: 5 units If CBG 301-350: 7 units If CBG 351-400: 9 units If CBG over 400: 12 units and call MD 01/09/21   Ezekiel Slocumb, DO  insulin detemir (LEVEMIR) 100 UNIT/ML injection Inject 0.15 mLs (15 Units total) into the skin 2 (two) times daily at 8 am and 10 pm. 01/09/21   Nicole Kindred A, DO  multivitamin (RENA-VIT) TABS tablet Take 1 tablet by mouth daily. 01/10/21   Ezekiel Slocumb, DO  nicotine (NICODERM CQ - DOSED IN MG/24 HOURS) 21 mg/24hr patch Place 1 patch (21 mg total) onto the skin daily. 01/10/21   Ezekiel Slocumb, DO  nitroGLYCERIN (NITROSTAT) 0.4 MG SL tablet Place 1 tablet (0.4 mg total) under the tongue every 5 (five) minutes as needed. Maximum 3 tablets Patient taking differently: Place 0.4 mg under the tongue every 5 (five) minutes x 3 doses as needed for chest pain. Maximum 3 tablets 06/19/13   Minna Merritts, MD  Nutritional Supplements (,FEEDING SUPPLEMENT, PROSOURCE PLUS) liquid Take 30 mLs by mouth daily. 01/09/21   Ezekiel Slocumb, DO  Nutritional Supplements (FEEDING SUPPLEMENT, NEPRO CARB STEADY,) LIQD Take 237 mLs by mouth daily. 01/09/21   Nicole Kindred A, DO  ondansetron (ZOFRAN) 4 MG tablet Take 1 tablet (4 mg total) by mouth every 6 (six) hours as needed for nausea. 01/09/21   Ezekiel Slocumb, DO  pantoprazole (PROTONIX) 40 MG tablet Take 40 mg by mouth 2 (two) times daily. Patient not taking: No sig reported 12/10/20   [provider]  polyethylene glycol (MIRALAX / GLYCOLAX) 17 g packet Take 17 g by mouth daily. 01/09/21   Ezekiel Slocumb, DO  senna-docusate (SENOKOT-S) 8.6-50 MG tablet Take 2 tablets by mouth 2 (two) times daily. 01/09/21   Ezekiel Slocumb, DO  Spacer/Aero-Holding Chambers (AEROCHAMBER MV) inhaler Use as instructed 01/20/15   McQuaid,  Ronie Spies, MD   valsartan-hydrochlorothiazide (DIOVAN-HCT) 320-25 MG per tablet TAKE ONE (1) TABLET EACH DAY 04/15/15 10/29/15  Minna Merritts, MD   No results found.  Positive ROS: All other systems have been reviewed and were otherwise negative with the exception of those mentioned in the HPI and as above.  Physical Exam: General: Alert, no acute distress Cardiovascular: No pedal edema Respiratory: No cyanosis, no use of accessory musculature GI: No organomegaly, abdomen is soft and non-tender Skin: No lesions in the area of chief complaint Neurologic: Sensation intact distally Psychiatric: Patient is competent for consent with normal mood and affect Lymphatic: No axillary or cervical lymphadenopathy  MUSCULOSKELETAL: left leg short, externally rotated. Compartments soft. Good cap refill. Motor and sensory intact distally.  Assessment: Left intertrochanteric hip fracture, closed, displaed  Plan: Plan trochanteric femoral nail.  The diagnosis, risks, benefits and alternatives to treatment are all discussed in detail with the patient and family. Risks include but are not limited to bleeding, infection, deep vein thrombosis, pulmonary embolism, nerve or vascular injury, non-union, repeat operation, persistent pain, weakness, stiffness and death. She understands and is eager to proceed.     Lovell Sheehan, MD    01/23/2021 3:43 PM

## 2020-12-31 NOTE — ED Provider Notes (Signed)
[facility] Emergency Department Provider Note   ____________________________________________   Event Date/Time   First MD Initiated Contact with Patient 12/31/20 1853     (approximate)  I have reviewed the triage vital signs and the nursing notes.   HISTORY  Chief Complaint Fall (Tripped on rub, no loc, no thinners, left hip pain )    HPI Andrea Stewart is a 75 y.o. female with a stated past medical history of heart failure, hypertension, and obesity who presents after a mechanical fall complaining of left hip pain.  Patient denies any head trauma or loss of consciousness.  Patient describes an aching, 10/10, nonradiating, left hip pain that is worse with palpation or any movement of the left hip.  Patient denies any numbness/paresthesias in this extremity.  Patient denies any color change in this extremity.  Patient denies any other trauma or pain.  Patient denies any other complaints.         Past Medical History:  Diagnosis Date  . Backache, unspecified    s/p c-spine and l-spine fusions  . COPD (chronic obstructive pulmonary disease) ([place])   . Degeneration of intervertebral disc, site unspecified   . Depressive disorder, not elsewhere classified   . Disorder of bone and cartilage, unspecified   . Family history of adverse reaction to anesthesia    mother did, but patient not sure of the complication  . HTN (hypertension)   . Hx of left heart catheterization by cutdown    a. 7 years ago; no significant cad; b. Lexiscan 2014: no significant ischemia, no significant EKG changes concerning for ischemia, EF 67%, overall low risk study  . Kidney failure    Dr Cyndia Diver  . Myalgia and myositis, unspecified   . Obesity, unspecified   . Personal history of tobacco use, presenting hazards to health    1/2 ppd  . Plantar fascial fibromatosis   . S/P cholecystectomy   . S/p nephrectomy   . Type II or unspecified type diabetes mellitus without  mention of complication, not stated as uncontrolled   . Unspecified essential hypertension   . Unspecified hereditary and idiopathic peripheral neuropathy    secondary to diabetes    Patient Active Problem List   Diagnosis Date Noted  . Hip fracture ([place]) 12/31/2020  . Insomnia 09/13/2019  . Pruritus 09/13/2019  . PAD (peripheral artery disease) ([place]) 05/30/2018  . Family history of colon cancer requiring screening colonoscopy 04/06/2018  . Incarcerated ventral hernia 09/07/2017  . Ventral hernia 09/07/2017  . Atherosclerosis of native arteries of extremity with intermittent claudication ([place]) 06/21/2017  . Morbid obesity ([place]) 10/21/2016  . Edema 06/19/2016  . Tobacco abuse counseling 12/17/2015  . Tobacco use disorder 12/11/2015  . Other specified postprocedural states 08/07/2015  . CKD (chronic kidney disease), stage III ([place]) 05/13/2015  . COPD (chronic obstructive pulmonary disease) with chronic bronchitis ([place]) 01/21/2015  . Abnormal findings on diagnostic imaging of other specified body structures 01/21/2015  . Thyroid mass 12/13/2014  . Hx of left heart catheterization by cutdown   . Single kidney 09/30/2014  . Encounter for general adult medical examination without abnormal findings 03/03/2014  . Personal history of diseases of the blood and blood-forming organs and certain disorders involving the immune mechanism 03/03/2014  . Pain of right leg 01/28/2014  . Irritable bowel syndrome 09/09/2012  . Coronary atherosclerosis 07/26/2009  . Diabetes mellitus type 2 with complications, uncontrolled ([place]) 03/09/2007  . DEPRESSION 03/09/2007  . PERIPHERAL NEUROPATHY 03/09/2007  .  Essential hypertension 03/09/2007  . DEGENERATIVE DISC DISEASE 03/09/2007  . BACK PAIN, CHRONIC 03/09/2007  . FIBROMYALGIA 03/09/2007  . Disorder of bone 03/09/2007  . Major depressive disorder, single episode 03/09/2007  . Narrowing of intervertebral disc space 03/09/2007  . Plantar fascial  fibromatosis 03/09/2007  . History of total knee replacement, right 08/04/2006  . History of total knee replacement, left 11/10/2004    Past Surgical History:  Procedure Laterality Date  . ABDOMINAL HYSTERECTOMY    . APPENDECTOMY    . BACK SURGERY     1966 and 2006   . CARDIAC CATHETERIZATION     o.k. 2003  . CARPAL TUNNEL RELEASE    . CERVICAL DISCECTOMY    . CHOLECYSTECTOMY    . COLONOSCOPY  09/1999   colonoscopy-hemorrhoids, re-check 5 years (10/2006)  . dexa     osteopenia (01/2004)  . ELBOW SURGERY    . ESOPHAGOGASTRODUODENOSCOPY (EGD) WITH PROPOFOL N/A 07/23/2020   Procedure: ESOPHAGOGASTRODUODENOSCOPY (EGD) WITH PROPOFOL;  Surgeon: Robert Bellow, MD;  Location: [place] ENDOSCOPY;  Service: Endoscopy;  Laterality: N/A;  TRAVEL TO O.R. - PATIENT TO HAVE SURGERY  . FOOT SURGERY     x 3  . JOINT REPLACEMENT    . KIDNEY DONATION  1991  . LAPAROSCOPIC ABDOMINAL EXPLORATION N/A 07/23/2020   Procedure: LAPAROSCOPIC ABDOMINAL EXPLORATION;  Surgeon: Robert Bellow, MD;  Location: [facility] ORS;  Service: General;  Laterality: N/A;  . LUMBAR FUSION    . NECK SURGERY     06/2005  . Problem with general Anesthesia     02/2006  . REPLACEMENT TOTAL KNEE  10/2004  . trigger finger surgery    . UPPER GI ENDOSCOPY N/A 07/23/2020   Procedure: UPPER GI ENDOSCOPY;  Surgeon: Robert Bellow, MD;  Location: [facility] ORS;  Service: General;  Laterality: N/A;  . VENTRAL HERNIA REPAIR N/A 09/08/2017   Primary repair of a 10 x 15 mm infraumbilical fascial defect.  Surgeon: Robert Bellow, MD;  Location: [facility] ORS;  Service: General;  Laterality: N/A;  . VESICOVAGINAL FISTULA CLOSURE W/ TAH    . VIDEO BRONCHOSCOPY Bilateral 04/02/2015   Procedure: VIDEO BRONCHOSCOPY WITHOUT FLUORO;  Surgeon: Juanito Doom, MD;  Location: [facility] ENDOSCOPY;  Service: Cardiopulmonary;  Laterality: Bilateral;    Prior to Admission medications   Medication Sig Start Date End Date Taking? Authorizing Provider   ALPRAZolam Duanne Moron) 0.5 MG tablet Take 0.5 mg by mouth daily as needed. 11/21/20   [provider]  atorvastatin (LIPITOR) 20 MG tablet TAKE 1 TABLET BY MOUTH DAILY Patient taking differently: Take 20 mg by mouth daily with supper.  08/28/19   Crecencio Mc, MD  B Complex-C-Folic Acid (RENAL) 1 MG CAPS Take 1 capsule by mouth daily. 06/25/20   [provider]  carvedilol (COREG) 25 MG tablet Take 25 mg by mouth 2 (two) times daily. 06/23/20   [provider]  divalproex (DEPAKOTE) 250 MG DR tablet Take 250 mg by mouth 2 (two) times daily. 10/13/20   [provider]  furosemide (LASIX) 80 MG tablet Take 80 mg by mouth daily. 06/30/20   [provider]  gentamicin cream (GARAMYCIN) 0.1 % Apply topically. 07/29/20   [provider]  hydrOXYzine (ATARAX/VISTARIL) 25 MG tablet Take 25 mg by mouth in the morning and at bedtime. 07/02/20   [provider]  INSULIN LISPRO 100 UNIT/ML KwikPen Junior Inject 2-6 Units into the skin 3 (three) times daily before meals. 100-150=2 units, 151-200=3 units, 201-280=4  units, 281-350=5 units,  & 351-400=6 units 05/02/20   [provider]  insulin NPH (HUMULIN N,NOVOLIN N) 100 UNIT/ML injection Inject 20-30 Units into the skin See admin instructions. Inject 20 units subcutaneously in the morning & inject 30 units subcutaneously at night.    [provider]  insulin regular (NOVOLIN R) 100 units/mL injection Inject into the skin 3 (three) times daily before meals.    [provider]  irbesartan (AVAPRO) 150 MG tablet Take 150 mg by mouth 2 (two) times daily.  06/22/17   [provider]  nitroGLYCERIN (NITROSTAT) 0.4 MG SL tablet Place 1 tablet (0.4 mg total) under the tongue every 5 (five) minutes as needed. Maximum 3 tablets Patient taking differently: Place 0.4 mg under the tongue every 5 (five) minutes x 3 doses as needed for chest pain. Maximum 3 tablets 06/19/13   Minna Merritts, MD  pantoprazole (PROTONIX) 40 MG tablet Take 40 mg by mouth 2 (two) times daily. 12/10/20   [provider]  Probiotic Product (PROBIOTIC PO) Take 1 capsule by mouth daily.    [provider]  REGLAN 5 MG tablet Take 5 mg by mouth 5 (five) times daily. 12/25/20   [provider]  Spacer/Aero-Holding Chambers (AEROCHAMBER MV) inhaler Use as instructed 01/20/15   Juanito Doom, MD  traZODone (DESYREL) 50 MG tablet TAKE ONE-HALF TO ONE TABLET BY MOUTH AT BEDTIME AS NEEDED FOR SLEEP Patient taking differently: Take 25-50 mg by mouth at bedtime as needed for sleep. 08/06/20   Crecencio Mc, MD  valsartan-hydrochlorothiazide (DIOVAN-HCT) 320-25 MG per tablet TAKE ONE (1) TABLET EACH DAY 04/15/15 10/29/15  Minna Merritts, MD    Allergies Morphine, Prednisone, Gabapentin, Hydralazine, Nsaids, and Penicillins  Family History  Problem Relation Age of Onset  . Hypertension Mother   . Colon cancer Mother   . Cancer Mother        colon  . Coronary artery disease Mother   . Diabetes Mother   . Prostate cancer Father   . COPD Father   . Cancer Father        prostate  . Heart attack Brother 35  . Sleep apnea Brother   . Diabetes Brother   . Diabetes Paternal Aunt   . Diabetes Paternal Uncle   . Cancer Daughter        Brain, lungs, liver, spine, kidney    Social History Social History   Tobacco Use  . Smoking status: Current Every Day Smoker    Packs/day: 0.25    Years: 30.00    Pack years: 7.50    Types: Cigarettes  . Smokeless tobacco: Never Used  . Tobacco comment: currently smoking .25 ppd, trying to quit 02/13/16  Substance Use Topics  . Alcohol use: No    Alcohol/week: 0.0 standard drinks  . Drug use: No    Review of Systems Constitutional: No fever/chills Eyes: No visual changes. ENT: No sore throat. Cardiovascular: Denies chest pain. Respiratory: Denies shortness of breath. Gastrointestinal: No abdominal pain.  No nausea, no vomiting.   No diarrhea. Genitourinary: Negative for dysuria. Musculoskeletal: Positive for acute left hip pain Skin: Negative for rash. Neurological: Negative for headaches, weakness/numbness/paresthesias in any extremity Psychiatric: Negative for suicidal ideation/homicidal ideation   ____________________________________________   PHYSICAL EXAM:  VITAL SIGNS: ED Triage Vitals  Enc Vitals Group     BP 12/31/20 1750 (!) 148/82     Pulse Rate 12/31/20 1750 76     Resp 12/31/20 1750  17     Temp 12/31/20 1750 98.5 F (36.9 C)     Temp Source 12/31/20 1750 Oral     SpO2 12/31/20 1750 92 %     Weight 12/31/20 1751 198 lb 6.6 oz (90 kg)     Height 12/31/20 1751 '5\' 7"'$  (1.702 m)     Head Circumference --      Peak Flow --      Pain Score 12/31/20 1751 6     Pain Loc --      Pain Edu? --      Excl. in [place]? --    Constitutional: Alert and oriented. Well appearing and in no acute distress. Eyes: Conjunctivae are normal. PERRL. Head: Atraumatic. Nose: No congestion/rhinnorhea. Mouth/Throat: Mucous membranes are moist. Neck: No stridor Cardiovascular: Grossly normal heart sounds.  Good peripheral circulation. Respiratory: Normal respiratory effort.  No retractions. Gastrointestinal: Soft and nontender. No distention. Musculoskeletal: Left lower extremity slightly shortened and externally rotated with significant tenderness to palpation over the left hip and with any range of motion at the left hip joint Neurologic:  Normal speech and language. No gross focal neurologic deficits are appreciated. Skin:  Skin is warm and dry. No rash noted. Psychiatric: Mood and affect are normal. Speech and behavior are normal.  ____________________________________________   LABS (all labs ordered are listed, but only abnormal results are displayed)  Labs Reviewed  COMPREHENSIVE METABOLIC PANEL - Abnormal; Notable for the following components:      Result Value   Sodium 133 (*)    Potassium 3.0 (*)     Chloride 96 (*)    Glucose, Bld 226 (*)    BUN 42 (*)    Creatinine, Ser 5.96 (*)    Calcium 8.6 (*)    Albumin 2.5 (*)    GFR, Estimated 7 (*)    All other components within normal limits  CBC WITH DIFFERENTIAL/PLATELET - Abnormal; Notable for the following components:   Abs Immature Granulocytes 0.09 (*)    All other components within normal limits  TROPONIN I (HIGH SENSITIVITY) - Abnormal; Notable for the following components:   Troponin I (High Sensitivity) 33 (*)    All other components within normal limits  RESP PANEL BY RT-PCR (FLU A&B, COVID) [id number]  TYPE AND SCREEN   RADIOLOGY  ED MD interpretation: Single view portable x-ray of the chest shows no evidence of acute abnormalities  3 view x-ray of the left hip and pelvis shows a comminuted intertrochanteric left hip fracture with mild impaction and varus angulation  Official radiology report(s): DG Chest Portable 1 View  Result Date: 12/31/2020 CLINICAL DATA:  Short of breath, left hip fracture EXAM: PORTABLE CHEST 1 VIEW COMPARISON:  01/08/2019 FINDINGS: Single frontal view of the chest demonstrates an unremarkable cardiac silhouette. No airspace disease, effusion, or pneumothorax. No acute bony abnormalities. IMPRESSION: 1. No acute intrathoracic process. Electronically Signed   By: Randa Ngo M.D.   On: 12/31/2020 18:37   DG Hip Unilat With Pelvis 2-3 Views Left  Result Date: 12/31/2020 CLINICAL DATA:  [place], left leg deformity EXAM: DG HIP (WITH OR WITHOUT PELVIS) 2-3V LEFT COMPARISON:  None. FINDINGS: Frontal view of the pelvis as well as frontal and cross-table lateral views of the left hip are obtained. There is a comminuted intertrochanteric left hip fracture with impaction and mild varus angulation. No dislocation. No other acute fractures within the remainder of the pelvis or right hip. Peritoneal dialysis catheter coiled within the right lower quadrant. Postsurgical changes  at the lumbosacral junction. IMPRESSION:  1. Comminuted intertrochanteric left hip fracture with mild impaction and varus angulation. Electronically Signed   By: Randa Ngo M.D.   On: 12/31/2020 18:36    ____________________________________________   PROCEDURES  Procedure(s) performed (including Critical Care):  .1-3 Lead EKG Interpretation Performed by: Naaman Plummer, MD Authorized by: Naaman Plummer, MD     Interpretation: normal     ECG rate:  73   ECG rate assessment: normal     Rhythm: sinus rhythm     Ectopy: none     Conduction: normal       ____________________________________________   INITIAL IMPRESSION / ASSESSMENT AND PLAN / ED COURSE  As part of my medical decision making, I reviewed the following data within the [facility] notes reviewed and incorporated, Labs reviewed, Old chart reviewed, Radiograph reviewed and Notes from prior ED visits reviewed and incorporated        Patient is a 75 year old female that presents for left hip pain Workup: XR hip Findings: Left intertrochanteric femur fracture without dislocation Consult: Orthopedic Surgery, hospitalist  Patient does not currently demonstrate complications of fracture such as compartment syndrome, arterial or nerve injury.  Interventions: analgesia Disposition: Admit  Clinical Course as of 12/31/20 1929  Wed Dec 31, 2020  1759 DG Hip Unilat With Pelvis 2-3 Views Left [RL]  [id number] DG Hip Unilat With Pelvis 2-3 Views Left [RL]    Clinical Course User Index [RL] Rennis Chris, Student-PA     ____________________________________________   FINAL CLINICAL IMPRESSION(S) / ED DIAGNOSES  Final diagnoses:  None     ED Discharge Orders    None       Note:  This document was prepared using Dragon voice recognition software and may include unintentional dictation errors.   Naaman Plummer, MD 12/31/20 [phone]

## 2020-12-31 NOTE — H&P (Signed)
[facility] History and Physical  JULIETTE MEZERA [id number] DOB: 14-Mar-1946 DOA: 12/31/2020  Referring physician: Hulan Saas, MD PCP: Rusty Aus, MD   Chief Complaint:   HPI: DURENDA LAROSA is a 75 y.o. female with history of ESRD on peritoneal dialysis, poorly controlled type 2 diabetes on insulin, hypertension, CAD, PAD, COPD, who presents after a fall.  Patient reports she was getting out of the shower and heading for her walker when she tripped on a carpet in the bathroom and fell on her left side.  She denies hitting her head.  Had immediate strong pain and called EMS to bring her to the hospital.  Denies any other acute issues currently including chest pain, new shortness of breath, abdominal pain, swelling in her legs. Denies numbness or tingling in her extremities. She reports she has taken all medicines as prescribed, and has been using her peritoneal dialysis nightly with her last run done yesterday.  In the ED initial vital signs unremarkable.  Basic lab work-up was notable for potassium of 3 and creatinine of 5.9, unremarkable CBC.  Plain films of the left hip showed an acute fracture.  Patient was admitted to medicine service due to complex medical issues, and orthopedics was consulted.  Review of Systems:  Pertinent positives and negative per HPI, all others reviewed and negative  Past Medical History:  Diagnosis Date  . Backache, unspecified    s/p c-spine and l-spine fusions  . COPD (chronic obstructive pulmonary disease) ([place])   . Degeneration of intervertebral disc, site unspecified   . Depressive disorder, not elsewhere classified   . Disorder of bone and cartilage, unspecified   . Family history of adverse reaction to anesthesia    mother did, but patient not sure of the complication  . HTN (hypertension)   . Hx of left heart catheterization by cutdown    a. 7 years ago; no significant cad; b. Lexiscan 2014: no significant ischemia, no  significant EKG changes concerning for ischemia, EF 67%, overall low risk study  . Kidney failure    Dr Cyndia Diver  . Myalgia and myositis, unspecified   . Obesity, unspecified   . Personal history of tobacco use, presenting hazards to health    1/2 ppd  . Plantar fascial fibromatosis   . S/P cholecystectomy   . S/p nephrectomy   . Type II or unspecified type diabetes mellitus without mention of complication, not stated as uncontrolled   . Unspecified essential hypertension   . Unspecified hereditary and idiopathic peripheral neuropathy    secondary to diabetes   Past Surgical History:  Procedure Laterality Date  . ABDOMINAL HYSTERECTOMY    . APPENDECTOMY    . BACK SURGERY     1966 and 2006   . CARDIAC CATHETERIZATION     o.k. 2003  . CARPAL TUNNEL RELEASE    . CERVICAL DISCECTOMY    . CHOLECYSTECTOMY    . COLONOSCOPY  09/1999   colonoscopy-hemorrhoids, re-check 5 years (10/2006)  . dexa     osteopenia (01/2004)  . ELBOW SURGERY    . ESOPHAGOGASTRODUODENOSCOPY (EGD) WITH PROPOFOL N/A 07/23/2020   Procedure: ESOPHAGOGASTRODUODENOSCOPY (EGD) WITH PROPOFOL;  Surgeon: Robert Bellow, MD;  Location: [facility];  Service: Endoscopy;  Laterality: N/A;  TRAVEL TO O.R. - PATIENT TO HAVE SURGERY  . FOOT SURGERY     x 3  . JOINT REPLACEMENT    . KIDNEY DONATION  1991  . LAPAROSCOPIC ABDOMINAL EXPLORATION N/A 07/23/2020   Procedure: LAPAROSCOPIC  ABDOMINAL EXPLORATION;  Surgeon: Robert Bellow, MD;  Location: [facility] ORS;  Service: General;  Laterality: N/A;  . LUMBAR FUSION    . NECK SURGERY     06/2005  . Problem with general Anesthesia     02/2006  . REPLACEMENT TOTAL KNEE  10/2004  . trigger finger surgery    . UPPER GI ENDOSCOPY N/A 07/23/2020   Procedure: UPPER GI ENDOSCOPY;  Surgeon: Robert Bellow, MD;  Location: [facility] ORS;  Service: General;  Laterality: N/A;  . VENTRAL HERNIA REPAIR N/A 09/08/2017   Primary repair of a 10 x 15 mm infraumbilical fascial defect.   Surgeon: Robert Bellow, MD;  Location: [facility] OR[facility];  Service: General;  Laterality: N/A;  . VESICOVAGINAL FISTULA CLOSURE W/ TAH    . VIDEO BRONCHOSCOPY Bilateral 04/02/2015   Procedure: VIDEO BRONCHOSCOPY WITHOUT FLUORO;  Surgeon: Juanito Doom, MD;  Location: [facility];  Service: Cardiopulmonary;  Laterality: Bilateral;   Social History:  reports that she has been smoking cigarettes. She has a 7.50 pack-year smoking history. She has never used smokeless tobacco. She reports that she does not drink alcohol and does not use drugs.  Allergies  Allergen Reactions  . Morphine Anaphylaxis  . Prednisone Other (See Comments)    "makes me think I am having a heart attack"  . Gabapentin Other (See Comments)    intolerance  . Hydralazine Itching  . Nsaids Diarrhea  . Penicillins Swelling    Has patient had a PCN reaction causing immediate rash, facial/tongue/throat swelling, SOB or lightheadedness with hypotension: Yes Has patient had a PCN reaction causing severe rash involving mucus membranes or skin necrosis: No Has patient had a PCN reaction that required hospitalization: No Has patient had a PCN reaction occurring within the last 10 years: No If all of the above answers are "NO", then may proceed with Cephalosporin use.     Family History  Problem Relation Age of Onset  . Hypertension Mother   . Colon cancer Mother   . Cancer Mother        colon  . Coronary artery disease Mother   . Diabetes Mother   . Prostate cancer Father   . COPD Father   . Cancer Father        prostate  . Heart attack Brother 61  . Sleep apnea Brother   . Diabetes Brother   . Diabetes Paternal Aunt   . Diabetes Paternal Uncle   . Cancer Daughter        Brain, lungs, liver, spine, kidney     Prior to Admission medications   Medication Sig Start Date End Date Taking? Authorizing Provider  albuterol (PROVENTIL HFA) 108 (90 Base) MCG/ACT inhaler Inhale 2 puffs every 4 (four) hours as needed into  the lungs for wheezing or shortness of breath. 09/26/17   Elby Beck, FNP  albuterol (PROVENTIL) (2.5 MG/3ML) 0.083% nebulizer solution Take 3 mLs (2.5 mg total) by nebulization every 4 (four) hours as needed for wheezing or shortness of breath. 12/28/18   Jodelle Green, FNP  amLODipine (NORVASC) 10 MG tablet TAKE 1 TABLET BY MOUTH DAILY 08/24/19   Crecencio Mc, MD  atorvastatin (LIPITOR) 20 MG tablet TAKE 1 TABLET BY MOUTH DAILY Patient taking differently: Take 20 mg by mouth daily with supper.  08/28/19   Crecencio Mc, MD  B Complex-C-Folic Acid (RENAL) 1 MG CAPS Take 1 capsule by mouth daily. 06/25/20   [provider]  BAYER ASPIRIN EC  LOW DOSE 81 MG EC tablet Take 81 mg by mouth daily. 04/22/20   [provider]  budesonide (PULMICORT) 0.25 MG/2ML nebulizer solution Take 2 mLs (0.25 mg total) by nebulization 2 (two) times daily. Patient taking differently: Take 0.25 mg by nebulization 2 (two) times daily as needed (respiratory issues.).  01/08/19   Crecencio Mc, MD  budesonide-formoterol (SYMBICORT) 160-4.5 MCG/ACT inhaler Inhale 2 puffs into the lungs 2 (two) times daily. Patient taking differently: Inhale 2 puffs into the lungs 2 (two) times daily as needed (respiratory issues).  07/12/19   Juanito Doom, MD  buPROPion (WELLBUTRIN SR) 100 MG 12 hr tablet Take 1 tablet (100 mg total) by mouth 2 (two) times daily. 09/12/19   Crecencio Mc, MD  calcium acetate (PHOSLO) 667 MG tablet Take by mouth. 04/15/20   [provider]  carvedilol (COREG) 25 MG tablet Take 25 mg by mouth 2 (two) times daily. 06/23/20   [provider]  Cholecalciferol (VITAMIN D3) 50 MCG (2000 UT) TABS Take 2,000 Units by mouth daily. Gummy 04/22/20   [provider]  cyclobenzaprine (FLEXERIL) 10 MG tablet Take 10 mg by mouth daily as needed. Back pain. 05/02/20   [provider]  furosemide (LASIX) 80 MG tablet Take 80 mg by mouth daily. 06/30/20   [provider]  gentamicin cream (GARAMYCIN) 0.1 % Apply topically. 07/29/20   [provider]  hydrOXYzine (ATARAX/VISTARIL) 25 MG tablet Take 25 mg by mouth in the morning and at bedtime. 07/02/20   [provider]  INSULIN LISPRO 100 UNIT/ML KwikPen Junior Inject 2-6 Units into the skin 3 (three) times daily before meals. 100-150=2 units, 151-200=3 units, 201-280=4 units, 281-350=5 units,  & 351-400=6 units 05/02/20   [provider]  insulin NPH (HUMULIN N,NOVOLIN N) 100 UNIT/ML injection Inject 20-30 Units into the skin See admin instructions. Inject 20 units subcutaneously in the morning & inject 30 units subcutaneously at night.    [provider]  insulin regular (NOVOLIN R) 100 units/mL injection Inject into the skin 3 (three) times daily before meals.    [provider]  irbesartan (AVAPRO) 150 MG tablet Take 150 mg by mouth 2 (two) times daily.  06/22/17   [provider]  nitroGLYCERIN (NITROSTAT) 0.4 MG SL tablet Place 1 tablet (0.4 mg total) under the tongue every 5 (five) minutes as needed. Maximum 3 tablets Patient taking differently: Place 0.4 mg under the tongue every 5 (five) minutes x 3 doses as needed for chest pain. Maximum 3 tablets 06/19/13   Minna Merritts, MD  Probiotic Product (PROBIOTIC PO) Take 1 capsule by mouth daily.    [provider]  Spacer/Aero-Holding Chambers (AEROCHAMBER MV) inhaler Use as instructed 01/20/15   Juanito Doom, MD  SUPER CALCIUM 1500 (600 Ca) MG TABS tablet Take 1 tablet by mouth daily. 04/22/20   [provider]  traMADol (ULTRAM) 50 MG tablet Take 50 mg by mouth daily as needed. Back pain.  05/02/20   [provider]  traZODone (DESYREL) 50 MG tablet TAKE ONE-HALF TO ONE TABLET BY MOUTH AT BEDTIME AS NEEDED FOR SLEEP 08/06/20   Crecencio Mc, MD  valsartan-hydrochlorothiazide (DIOVAN-HCT) 320-25 MG per tablet TAKE ONE (1) TABLET EACH DAY 04/15/15 10/29/15  Minna Merritts, MD   Physical Exam: Vitals:   12/31/20 1750 12/31/20 1751  BP: (!) 148/82   Pulse: 76   Resp: 17   Temp: 98.5 F (36.9 C)  TempSrc: Oral   SpO2: 92%   Weight:  90 kg  Height:  '5\' 7"'$  (1.702 m)    Wt Readings from Last 3 Encounters:  12/31/20 90 kg  08/26/20 79.8 kg  07/17/20 80.3 kg     . General:  Appears calm and mildly uncomfortable . Eyes: PERRL, normal lids, irises & conjunctiva . ENT: grossly normal hearing, lips & tongue . Neck: no masses . Cardiovascular: irregular, no m/r/g. No LE edema. Palpable DP pulses bilaterally . Telemetry: SR, frequent PVCs . Respiratory: CTA bilaterally, no w/r/r. Normal respiratory effort. . Abdomen: soft, ntnd. PD catheter present in RLQ, dexcom present in LLQ . Skin: no rash or induration seen on limited exam . Musculoskeletal: L leg externally rotated and visible shortened. Able to move bilateral toes. Marland Kitchen Psychiatric: grossly normal mood and affect, speech fluent and appropriate . Neurologic: intact sensation to light touch in bilateral lower extremities          Labs on Admission:  Basic Metabolic Panel: No results for input(s): NA, K, CL, CO2, GLUCOSE, BUN, CREATININE, CALCIUM, MG, PHOS in the last 168 hours. Liver Function Tests: No results for input(s): AST, ALT, ALKPHOS, BILITOT, PROT, ALBUMIN in the last 168 hours. No results for input(s): LIPASE, AMYLASE in the last 168 hours. No results for input(s): AMMONIA in the last 168 hours. CBC: Recent Labs  Lab 12/31/20 1839  WBC 9.2  NEUTROABS 6.8  HGB 13.1  HCT 40.3  MCV 95.0  PLT 244   Cardiac Enzymes: No results for input(s): CKTOTAL, CKMB, CKMBINDEX, TROPONINI in the last 168 hours.  BNP (last 3 results) No results for input(s): BNP in the last 8760 hours.  ProBNP (last 3 results) No results for input(s): PROBNP in the last 8760 hours.  CBG: No results for input(s): GLUCAP in the last 168 hours.  Radiological Exams on Admission: DG Chest Portable 1  View  Result Date: 12/31/2020 CLINICAL DATA:  Short of breath, left hip fracture EXAM: PORTABLE CHEST 1 VIEW COMPARISON:  01/08/2019 FINDINGS: Single frontal view of the chest demonstrates an unremarkable cardiac silhouette. No airspace disease, effusion, or pneumothorax. No acute bony abnormalities. IMPRESSION: 1. No acute intrathoracic process. Electronically Signed   By: Randa Ngo M.D.   On: 12/31/2020 18:37   DG Hip Unilat With Pelvis 2-3 Views Left  Result Date: 12/31/2020 CLINICAL DATA:  [place], left leg deformity EXAM: DG HIP (WITH OR WITHOUT PELVIS) 2-3V LEFT COMPARISON:  None. FINDINGS: Frontal view of the pelvis as well as frontal and cross-table lateral views of the left hip are obtained. There is a comminuted intertrochanteric left hip fracture with impaction and mild varus angulation. No dislocation. No other acute fractures within the remainder of the pelvis or right hip. Peritoneal dialysis catheter coiled within the right lower quadrant. Postsurgical changes at the lumbosacral junction. IMPRESSION: 1. Comminuted intertrochanteric left hip fracture with mild impaction and varus angulation. Electronically Signed   By: Randa Ngo M.D.   On: 12/31/2020 18:36    EKG: pending  Assessment/Plan Active Problems:   Diabetes mellitus type 2 with complications, uncontrolled (HCC)   Essential hypertension   Coronary atherosclerosis   Single kidney   COPD (chronic obstructive pulmonary disease) with chronic bronchitis (HCC)   CKD (chronic kidney disease), stage III ([place])   Morbid obesity ([place])   Atherosclerosis of native arteries of extremity with intermittent claudication (HCC)   PAD (peripheral artery disease) ([place])   History of total knee replacement, left   History of  total knee replacement, right   Hip fracture ([place])   #Left hip fracture Patient presenting with acute left hip fracture, Dr. Harlow Mares from orthopedic surgery plans to take her to the OR in the morning. -N.p.o. at  midnight -SCDs and subcu heparin per hip fracture order set -Fentanyl 50 mcg every 1 hours as needed for severe pain -Neurovascular checks every 4 hours  #Known medical problems ESRD-consult nephrology in a.m. for management of her peritoneal dialysis, continue furosemide  DM2-reports taking approximately 60 units of NPH daily and a variable amount of regular insulin, will place her on every 4 hours CBG with ESRD sliding scale, will conservatively reduce her NPH to 10 units twice daily and increase as needed  Anxiety-continue Xanax nightly, hydroxyzine as needed  HLD-continue Lipitor  Hypertension-continue carvedilol  GERD-continue PPI  Gastroparesis-continue Reglan  Insomnia-continue trazodone  Code Status: DNR/DNI, confirmed with patient DVT Prophylaxis: SCDs, heparin 5k q8h per pre-op order set Family Communication: husband updated at bedside Disposition Plan: Inpatient Med-Surg   Time spent: 1 min  Clarnce Flock MD/MPH [facility]

## 2021-01-01 ENCOUNTER — Inpatient Hospital Stay: Payer: PPO | Admitting: Certified Registered"

## 2021-01-01 ENCOUNTER — Encounter
Admission: EM | Disposition: A | Discharge status: Skilled Nursing Facility | Payer: Self-pay | Source: Home / Self Care | Attending: Internal Medicine

## 2021-01-01 ENCOUNTER — Other Ambulatory Visit: Payer: Self-pay

## 2021-01-01 ENCOUNTER — Inpatient Hospital Stay: Payer: PPO

## 2021-01-01 DIAGNOSIS — I70212 Atherosclerosis of native arteries of extremities with intermittent claudication, left leg: Secondary | ICD-10-CM

## 2021-01-01 DIAGNOSIS — I2583 Coronary atherosclerosis due to lipid rich plaque: Secondary | ICD-10-CM

## 2021-01-01 DIAGNOSIS — S72002A Fracture of unspecified part of neck of left femur, initial encounter for closed fracture: Secondary | ICD-10-CM

## 2021-01-01 DIAGNOSIS — N186 End stage renal disease: Secondary | ICD-10-CM

## 2021-01-01 DIAGNOSIS — I2510 Atherosclerotic heart disease of native coronary artery without angina pectoris: Secondary | ICD-10-CM

## 2021-01-01 DIAGNOSIS — I10 Essential (primary) hypertension: Secondary | ICD-10-CM

## 2021-01-01 HISTORY — PX: FEMUR IM NAIL: SHX1597

## 2021-01-01 LAB — GLUCOSE, CAPILLARY
Glucose-Capillary: 105 mg/dL — ABNORMAL HIGH (ref 70–99)
Glucose-Capillary: 111 mg/dL — ABNORMAL HIGH (ref 70–99)
Glucose-Capillary: 119 mg/dL — ABNORMAL HIGH (ref 70–99)
Glucose-Capillary: 161 mg/dL — ABNORMAL HIGH (ref 70–99)
Glucose-Capillary: 224 mg/dL — ABNORMAL HIGH (ref 70–99)
Glucose-Capillary: 232 mg/dL — ABNORMAL HIGH (ref 70–99)
Glucose-Capillary: 239 mg/dL — ABNORMAL HIGH (ref 70–99)

## 2021-01-01 LAB — BASIC METABOLIC PANEL
Anion gap: 10 (ref 5–15)
BUN: 49 mg/dL — ABNORMAL HIGH (ref 8–23)
CO2: 27 mmol/L (ref 22–32)
Calcium: 8.6 mg/dL — ABNORMAL LOW (ref 8.9–10.3)
Chloride: 98 mmol/L (ref 98–111)
Creatinine, Ser: 6.14 mg/dL — ABNORMAL HIGH (ref 0.44–1.00)
GFR, Estimated: 7 mL/min — ABNORMAL LOW (ref >60)
Glucose, Bld: 217 mg/dL — ABNORMAL HIGH (ref 70–99)
Potassium: 3.3 mmol/L — ABNORMAL LOW (ref 3.5–5.1)
Sodium: 135 mmol/L (ref 135–145)

## 2021-01-01 LAB — CBC
HCT: 36.9 % (ref 36.0–46.0)
Hemoglobin: 11.9 g/dL — ABNORMAL LOW (ref 12.0–15.0)
MCH: 30.8 pg (ref 26.0–34.0)
MCHC: 32.2 g/dL (ref 30.0–36.0)
MCV: 95.6 fL (ref 80.0–100.0)
Platelets: 218 10*3/uL (ref 150–400)
RBC: 3.86 MIL/uL — ABNORMAL LOW (ref 3.87–5.11)
RDW: 13.3 % (ref 11.5–15.5)
WBC: 8.3 10*3/uL (ref 4.0–10.5)
nRBC: 0 % (ref 0.0–0.2)

## 2021-01-01 SURGERY — INSERTION, INTRAMEDULLARY ROD, FEMUR
Anesthesia: General | Laterality: Left

## 2021-01-01 MED ORDER — FENTANYL CITRATE (PF) 100 MCG/2ML IJ SOLN
25.0000 ug | INTRAMUSCULAR | Status: DC | PRN
Start: 2021-01-01 — End: 2021-01-01
  Administered 2021-01-01 (×2): 25 ug via INTRAVENOUS

## 2021-01-01 MED ORDER — ACETAMINOPHEN 10 MG/ML IV SOLN
INTRAVENOUS | Status: DC | PRN
  Administered 2021-01-01: 1000 mg via INTRAVENOUS

## 2021-01-01 MED ORDER — BUPIVACAINE-EPINEPHRINE (PF) 0.25% -1:200000 IJ SOLN
INTRAMUSCULAR | Status: DC | PRN
  Administered 2021-01-01: 30 mL

## 2021-01-01 MED ORDER — RENA-VITE PO TABS
1.0000 | ORAL_TABLET | Freq: Every day | ORAL | Status: DC
  Administered 2021-01-02 – 2021-01-09 (×8): 1 via ORAL
  Filled 2021-01-01 (×9): qty 1

## 2021-01-01 MED ORDER — ONDANSETRON HCL 4 MG/2ML IJ SOLN: 4.0000 mg | Freq: Once | INTRAMUSCULAR | Status: DC | PRN

## 2021-01-01 MED ORDER — ONDANSETRON HCL 4 MG/2ML IJ SOLN
INTRAMUSCULAR | Status: AC
  Filled 2021-01-01: qty 2

## 2021-01-01 MED ORDER — GENTAMICIN SULFATE 0.1 % EX CREA
1.0000 "application " | TOPICAL_CREAM | Freq: Every day | CUTANEOUS | Status: DC
  Administered 2021-01-02 – 2021-01-06 (×5): 1 via TOPICAL
  Filled 2021-01-01: qty 15

## 2021-01-01 MED ORDER — GLYCOPYRROLATE 0.2 MG/ML IJ SOLN
INTRAMUSCULAR | Status: AC
  Filled 2021-01-01: qty 1

## 2021-01-01 MED ORDER — LIDOCAINE HCL (CARDIAC) PF 100 MG/5ML IV SOSY
PREFILLED_SYRINGE | INTRAVENOUS | Status: DC | PRN
  Administered 2021-01-01: 80 mg via INTRAVENOUS

## 2021-01-01 MED ORDER — BUPIVACAINE-EPINEPHRINE (PF) 0.25% -1:200000 IJ SOLN
INTRAMUSCULAR | Status: AC
  Filled 2021-01-01: qty 30

## 2021-01-01 MED ORDER — PROPOFOL 500 MG/50ML IV EMUL
INTRAVENOUS | Status: AC
  Filled 2021-01-01: qty 50

## 2021-01-01 MED ORDER — ONDANSETRON HCL 4 MG/2ML IJ SOLN: 4.0000 mg | Freq: Four times a day (QID) | INTRAMUSCULAR | Status: DC | PRN

## 2021-01-01 MED ORDER — SUGAMMADEX SODIUM 200 MG/2ML IV SOLN
INTRAVENOUS | Status: DC | PRN
  Administered 2021-01-01: 400 mg via INTRAVENOUS

## 2021-01-01 MED ORDER — MIDAZOLAM HCL 2 MG/2ML IJ SOLN
INTRAMUSCULAR | Status: AC
  Filled 2021-01-01: qty 2

## 2021-01-01 MED ORDER — PROSOURCE PLUS PO LIQD
30.0000 mL | Freq: Every day | ORAL | Status: DC
  Administered 2021-01-01 – 2021-01-08 (×9): 30 mL via ORAL
  Filled 2021-01-01 (×9): qty 30

## 2021-01-01 MED ORDER — DEXAMETHASONE SODIUM PHOSPHATE 10 MG/ML IJ SOLN
INTRAMUSCULAR | Status: DC | PRN
  Administered 2021-01-01: 10 mg via INTRAVENOUS

## 2021-01-01 MED ORDER — PROPOFOL 10 MG/ML IV BOLUS
INTRAVENOUS | Status: AC
  Filled 2021-01-01: qty 20

## 2021-01-01 MED ORDER — ACETAMINOPHEN 10 MG/ML IV SOLN
INTRAVENOUS | Status: AC
  Filled 2021-01-01: qty 100

## 2021-01-01 MED ORDER — LIDOCAINE HCL (PF) 2 % IJ SOLN
INTRAMUSCULAR | Status: AC
  Filled 2021-01-01: qty 5

## 2021-01-01 MED ORDER — NEPRO/CARBSTEADY PO LIQD
237.0000 mL | ORAL | Status: DC
  Administered 2021-01-02 – 2021-01-03 (×2): 237 mL via ORAL

## 2021-01-01 MED ORDER — PHENYLEPHRINE HCL (PRESSORS) 10 MG/ML IV SOLN
INTRAVENOUS | Status: AC
  Filled 2021-01-01: qty 1

## 2021-01-01 MED ORDER — DELFLEX-LC/1.5% DEXTROSE 344 MOSM/L IP SOLN
INTRAPERITONEAL | Status: DC
  Filled 2021-01-01 (×6): qty 3000

## 2021-01-01 MED ORDER — ROCURONIUM BROMIDE 10 MG/ML (PF) SYRINGE
PREFILLED_SYRINGE | INTRAVENOUS | Status: AC
  Filled 2021-01-01: qty 10

## 2021-01-01 MED ORDER — METOCLOPRAMIDE HCL 10 MG PO TABS: 5.0000 mg | ORAL_TABLET | Freq: Three times a day (TID) | ORAL | Status: DC | PRN

## 2021-01-01 MED ORDER — ONDANSETRON HCL 4 MG PO TABS: 4.0000 mg | ORAL_TABLET | Freq: Four times a day (QID) | ORAL | Status: DC | PRN

## 2021-01-01 MED ORDER — FENTANYL CITRATE (PF) 100 MCG/2ML IJ SOLN
INTRAMUSCULAR | Status: AC
  Filled 2021-01-01: qty 2

## 2021-01-01 MED ORDER — OXYCODONE HCL 5 MG PO TABS
5.0000 mg | ORAL_TABLET | ORAL | Status: DC | PRN
  Administered 2021-01-01 – 2021-01-04 (×8): 5 mg via ORAL
  Filled 2021-01-01 (×11): qty 1

## 2021-01-01 MED ORDER — DOCUSATE SODIUM 100 MG PO CAPS
100.0000 mg | ORAL_CAPSULE | Freq: Two times a day (BID) | ORAL | Status: DC
  Administered 2021-01-01 – 2021-01-04 (×6): 100 mg via ORAL
  Filled 2021-01-01 (×7): qty 1

## 2021-01-01 MED ORDER — SODIUM CHLORIDE FLUSH 0.9 % IV SOLN
INTRAVENOUS | Status: AC
  Filled 2021-01-01: qty 10

## 2021-01-01 MED ORDER — FENTANYL CITRATE (PF) 100 MCG/2ML IJ SOLN
INTRAMUSCULAR | Status: AC
  Administered 2021-01-01: 50 ug via INTRAVENOUS
  Filled 2021-01-01: qty 2

## 2021-01-01 MED ORDER — TRANEXAMIC ACID 1000 MG/10ML IV SOLN
INTRAVENOUS | Status: AC
  Filled 2021-01-01: qty 10

## 2021-01-01 MED ORDER — PROPOFOL 10 MG/ML IV BOLUS
INTRAVENOUS | Status: DC | PRN
  Administered 2021-01-01: 120 mg via INTRAVENOUS

## 2021-01-01 MED ORDER — FENTANYL CITRATE (PF) 100 MCG/2ML IJ SOLN
INTRAMUSCULAR | Status: DC | PRN
  Administered 2021-01-01: 50 ug via INTRAVENOUS

## 2021-01-01 MED ORDER — METOCLOPRAMIDE HCL 5 MG/ML IJ SOLN: 5.0000 mg | Freq: Three times a day (TID) | INTRAMUSCULAR | Status: DC | PRN

## 2021-01-01 MED ORDER — DEXAMETHASONE SODIUM PHOSPHATE 10 MG/ML IJ SOLN
INTRAMUSCULAR | Status: AC
  Filled 2021-01-01: qty 1

## 2021-01-01 MED ORDER — SODIUM CHLORIDE 0.9 % IV SOLN: INTRAVENOUS | Status: DC

## 2021-01-01 MED ORDER — ROCURONIUM BROMIDE 100 MG/10ML IV SOLN
INTRAVENOUS | Status: DC | PRN
  Administered 2021-01-01: 50 mg via INTRAVENOUS

## 2021-01-01 MED ORDER — CLINDAMYCIN PHOSPHATE 600 MG/50ML IV SOLN
INTRAVENOUS | Status: AC
  Filled 2021-01-01: qty 50

## 2021-01-01 SURGICAL SUPPLY — 39 items
APL PRP STRL LF DISP 70% ISPRP (MISCELLANEOUS) ×1
BIT DRILL CANN STP 6/9 HIP (BIT) ×1 IMPLANT
BLADE TFNA HELICAL 95 NON STRL (Anchor) ×1 IMPLANT
BNDG COHESIVE 4X5 TAN STRL (GAUZE/BANDAGES/DRESSINGS) IMPLANT
BRUSH SCRUB EZ  4% CHG (MISCELLANEOUS) ×4
BRUSH SCRUB EZ 4% CHG (MISCELLANEOUS) ×2 IMPLANT
CHLORAPREP W/TINT 26 (MISCELLANEOUS) ×2 IMPLANT
COVER WAND RF STERILE (DRAPES) ×2 IMPLANT
DRAPE 3/4 80X56 (DRAPES) ×2 IMPLANT
DRAPE U-SHAPE 47X51 STRL (DRAPES) ×2 IMPLANT
DRSG AQUACEL AG ADV 3.5X 4 (GAUZE/BANDAGES/DRESSINGS) ×1 IMPLANT
DRSG AQUACEL AG ADV 3.5X10 (GAUZE/BANDAGES/DRESSINGS) ×2 IMPLANT
ELECT REM PT RETURN 9FT ADLT (ELECTROSURGICAL) ×2
ELECTRODE REM PT RTRN 9FT ADLT (ELECTROSURGICAL) ×1 IMPLANT
GAUZE XEROFORM 1X8 LF (GAUZE/BANDAGES/DRESSINGS) ×2 IMPLANT
GLOVE INDICATOR 8.0 STRL GRN (GLOVE) ×2 IMPLANT
GLOVE SURG ORTHO LTX SZ8 (GLOVE) ×2 IMPLANT
GOWN STRL REUS W/ TWL LRG LVL3 (GOWN DISPOSABLE) ×1 IMPLANT
GOWN STRL REUS W/ TWL XL LVL3 (GOWN DISPOSABLE) ×1 IMPLANT
GOWN STRL REUS W/TWL LRG LVL3 (GOWN DISPOSABLE) ×2
GOWN STRL REUS W/TWL XL LVL3 (GOWN DISPOSABLE) ×2
GUIDEWIRE 3.2X400 (WIRE) ×2 IMPLANT
IRRIGATION SURGIPHOR STRL (IV SOLUTION) ×1 IMPLANT
KIT PATIENT CARE HANA TABLE (KITS) ×2 IMPLANT
KIT TURNOVER CYSTO (KITS) ×2 IMPLANT
MANIFOLD NEPTUNE II (INSTRUMENTS) ×2 IMPLANT
MAT ABSORB  FLUID 56X50 GRAY (MISCELLANEOUS) ×2
MAT ABSORB FLUID 56X50 GRAY (MISCELLANEOUS) ×1 IMPLANT
NAIL 9MM/130 TI CANN TFNA 340 (Nail) ×1 IMPLANT
NDL SPNL 20GX3.5 QUINCKE YW (NEEDLE) ×1 IMPLANT
NEEDLE SPNL 20GX3.5 QUINCKE YW (NEEDLE) ×2 IMPLANT
NS IRRIG 1000ML POUR BTL (IV SOLUTION) ×2 IMPLANT
PACK HIP COMPR (MISCELLANEOUS) ×2 IMPLANT
STAPLER SKIN PROX 35W (STAPLE) ×2 IMPLANT
SUT VIC AB 0 CT1 36 (SUTURE) ×2 IMPLANT
SUT VIC AB 2-0 CT1 27 (SUTURE) ×4
SUT VIC AB 2-0 CT1 TAPERPNT 27 (SUTURE) ×2 IMPLANT
SYR 30ML LL (SYRINGE) ×2 IMPLANT
TOWEL OR 17X26 4PK STRL BLUE (TOWEL DISPOSABLE) ×2 IMPLANT

## 2021-01-01 NOTE — H&P (Signed)
The patient has been re-examined, and the chart reviewed, and there have been no interval changes to the documented history and physical.  Plan a Left hip fracture repair today.  Anesthesia is not consulted regarding a peripheral nerve block for post-operative pain.  The risks, benefits, and alternatives have been discussed at length, and the patient is willing to proceed.

## 2021-01-01 NOTE — Progress Notes (Signed)
Andrea Stewart, [name] 01/01/21  Subjective:   Andrea Stewart is a 75 y.o. 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 She states she was getting out of shower and fell on the carpet.   She is admitted with a  Left Hip Fracture.  Patient is seen while in bed visibly uncomfortable Husband at bedside Alert and oriented Denies shortness of breath and chest pain States surgery is scheduled for today at 2pm. Complains of constant pain from left hip.     Objective:  Vital signs in last 24 hours:  Temp:  [97.4 F (36.3 C)-98.5 F (36.9 C)] 97.4 F (36.3 C) (02/17 1130) Pulse Rate:  [61-77] 71 (02/17 1405) Resp:  [12-17] 12 (02/17 1405) BP: (117-160)/(55-94) 120/94 (02/17 1405) SpO2:  [92 %-100 %] 99 % (02/17 1405) Weight:  [90 kg] 90 kg (02/16 1751)  Weight change:  Filed Weights   12/31/20 1751  Weight: 90 kg    Intake/Output:   No intake or output data in the 24 hours ending 01/01/21 1448   Physical Exam: General: Visibly uncomfortable, laying in bed  HEENT Normocephalic, moist oral mucosa  Pulm/lungs Wheeze in bases, unlabored  CVS/Heart regular  Abdomen:  Soft, non-tender  Extremities: Minimal peripheral edema  Neurologic: Alert, oriented, able to answer questions  Skin: Left hip bruised  Access: PD catheter       Basic Metabolic Panel:  Recent Labs  Lab 12/31/20 1839 01/01/21 0643  NA 133* 135  K 3.0* 3.3*  CL 96* 98  CO2 26 27  GLUCOSE 226* 217*  BUN 42* 49*  CREATININE 5.96* 6.14*  CALCIUM 8.6* 8.6*     CBC: Recent Labs  Lab 12/31/20 1839 01/01/21 0643  WBC 9.2 8.3  NEUTROABS 6.8  --   HGB 13.1 11.9*  HCT 40.3 36.9  MCV 95.0 95.6  PLT 244 218     No results found for: HEPBSAG, HEPBSAB, HEPBIGM    Microbiology:  Recent Results (from the past 240 hour(s))  Resp Panel by RT-PCR (Flu A&B, Covid) Nasopharyngeal Swab     Status: None   Collection  Time: 12/31/20  6:39 PM   Specimen: Nasopharyngeal Swab; Nasopharyngeal(NP) swabs in vial transport medium  Result Value Ref Range Status   SARS Coronavirus 2 by RT PCR NEGATIVE NEGATIVE Final    Comment: (NOTE) SARS-CoV-2 target nucleic acids are NOT DETECTED.  The SARS-CoV-2 RNA is generally detectable in upper respiratory specimens during the acute phase of infection. The lowest concentration of SARS-CoV-2 viral copies this assay can detect is 138 copies/mL. A negative result does not preclude SARS-Cov-2 infection and should not be used as the sole basis for treatment or other patient management decisions. A negative result may occur with  improper specimen collection/handling, submission of specimen other than nasopharyngeal swab, presence of viral mutation(s) within the areas targeted by this assay, and inadequate number of viral copies(<138 copies/mL). A negative result must be combined with clinical observations, patient history, and epidemiological information. The expected result is Negative.  Fact Sheet for Patients:  EntrepreneurPulse.com.au  Fact Sheet for Healthcare Providers:  IncredibleEmployment.be  This test is no t yet approved or cleared by the [place] FDA and  has been authorized for detection and/or diagnosis of SARS-CoV-2 by FDA under an Emergency Use Authorization (EUA). This EUA will remain  in effect (meaning this test can be used) for the duration of the COVID-19 declaration under Section  564(b)(1) of the Act, 21 U.S.C.section 360bbb-3(b)(1), unless the authorization is terminated  or revoked sooner.       Influenza A by PCR NEGATIVE NEGATIVE Final   Influenza B by PCR NEGATIVE NEGATIVE Final    Comment: (NOTE) The Xpert Xpress SARS-CoV-2/FLU/RSV plus assay is intended as an aid in the diagnosis of influenza from Nasopharyngeal swab specimens and should not be used as a sole basis for treatment. Nasal washings  and aspirates are unacceptable for Xpert Xpress SARS-CoV-2/FLU/RSV testing.  Fact Sheet for Patients: EntrepreneurPulse.com.au  Fact Sheet for Healthcare Providers: IncredibleEmployment.be  This test is not yet approved or cleared by the [place] FDA and has been authorized for detection and/or diagnosis of SARS-CoV-2 by FDA under an Emergency Use Authorization (EUA). This EUA will remain in effect (meaning this test can be used) for the duration of the COVID-19 declaration under Section 564(b)(1) of the Act, 21 U.S.C. section 360bbb-3(b)(1), unless the authorization is terminated or revoked.  Performed at [facility], [address] [place], [place] [address]     Coagulation Studies: No results for input(s): LABPROT, INR in the last 72 hours.  Urinalysis: No results for input(s): COLORURINE, LABSPEC, PHURINE, GLUCOSEU, HGBUR, BILIRUBINUR, KETONESUR, PROTEINUR, UROBILINOGEN, NITRITE, LEUKOCYTESUR in the last 72 hours.  Invalid input(s): APPERANCEUR    Imaging: DG Chest Portable 1 View  Result Date: 12/31/2020 CLINICAL DATA:  Short of breath, left hip fracture EXAM: PORTABLE CHEST 1 VIEW COMPARISON:  01/08/2019 FINDINGS: Single frontal view of the chest demonstrates an unremarkable cardiac silhouette. No airspace disease, effusion, or pneumothorax. No acute bony abnormalities. IMPRESSION: 1. No acute intrathoracic process. Electronically Signed   By: Randa Ngo M.D.   On: 12/31/2020 18:37   DG Hip Unilat With Pelvis 2-3 Views Left  Result Date: 12/31/2020 CLINICAL DATA:  [place], left leg deformity EXAM: DG HIP (WITH OR WITHOUT PELVIS) 2-3V LEFT COMPARISON:  None. FINDINGS: Frontal view of the pelvis as well as frontal and cross-table lateral views of the left hip are obtained. There is a comminuted intertrochanteric left hip fracture with impaction and mild varus angulation. No dislocation. No other acute fractures within the  remainder of the pelvis or right hip. Peritoneal dialysis catheter coiled within the right lower quadrant. Postsurgical changes at the lumbosacral junction. IMPRESSION: 1. Comminuted intertrochanteric left hip fracture with mild impaction and varus angulation. Electronically Signed   By: Randa Ngo M.D.   On: 12/31/2020 18:36     Medications:   . sodium chloride    . [MAR Hold] clindamycin (CLEOCIN) IV    . [MAR Hold] tranexamic acid Stopped (01/01/21 0130)   . [MAR Hold] (feeding supplement) PROSource Plus  30 mL Oral Daily  . [MAR Hold] atorvastatin  20 mg Oral Daily  . [MAR Hold] carvedilol  25 mg Oral BID  . [MAR Hold] feeding supplement (NEPRO CARB STEADY)  237 mL Oral Q24H  . [MAR Hold] furosemide  80 mg Oral Daily  . [MAR Hold] heparin injection (subcutaneous)  5,000 Units Subcutaneous Q8H  . [MAR Hold] insulin aspart  0-6 Units Subcutaneous Q4H  . [MAR Hold] insulin detemir  10 Units Subcutaneous BID AC & HS  . [MAR Hold] metoCLOPramide  5 mg Oral 5 X Daily  . [MAR Hold] multivitamin  1 tablet Oral Daily  . [MAR Hold] pantoprazole  40 mg Oral BID   [MAR Hold] ALPRAZolam, [MAR Hold] fentaNYL (SUBLIMAZE) injection, [MAR Hold] hydrOXYzine, [MAR Hold] oxyCODONE, [MAR Hold] polyethylene glycol, [MAR Hold] traZODone  Assessment/ Plan:  75 y.o. 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 was admitted on 12/31/2020 for Hip fracture ([place]) [S72.009A]  Active Problems:   Diabetes mellitus type 2 with complications, uncontrolled ([place])   Essential hypertension   Coronary atherosclerosis   Single kidney   COPD (chronic obstructive pulmonary disease) with chronic bronchitis (HCC)   CKD (chronic kidney disease), stage III ([place])   Morbid obesity ([place])   Atherosclerosis of native arteries of extremity with intermittent claudication (HCC)   PAD (peripheral artery disease) ([place])   History of total knee replacement, left   History  of total knee replacement, right   Hip fracture ([place])  Hip fracture ([place]) [S72.009A] Fall, initial encounter [W19.XXXA] Closed 2-part intertrochanteric fracture of left femur, initial encounter ([place]) [S72.142A]  #. ESRD on peritoneal dialysis -peritoneal dialysis ordered nightly per home regimen -will monitor labs  #. Anemia of CKD  Lab Results  Component Value Date   HGB 11.9 (L) 01/01/2021   -within acceptable range at this time -will continue to monitor  #. Secondary hyperparathyroidism   No results found for: PTH No results found for: PHOS Monitor calcium and phos level during this admission   #. Diabetes type 2 with CKD Hemoglobin A1C (no units)  Date Value  07/09/2019 8.5  -Elevated glucose levels during this admission -SSI and Levemir ordered to manage  # Left Hip Fracture -Surgical repair performed today -appreciate surgical involvement     LOS: [place] 2/17/20222:48 PM  [place], [place]

## 2021-01-01 NOTE — Progress Notes (Signed)
CSW acknowledges consult for SNF placement. Please place PT/OT consults when appropriate and CSW will follow up based on their recommendations.  Oleh Genin, [place]

## 2021-01-01 NOTE — Anesthesia Postprocedure Evaluation (Signed)
Anesthesia Post Note  Patient: Andrea Stewart  Procedure(s) Performed: INTRAMEDULLARY (IM) NAIL FEMORAL (Left )  Patient location during evaluation: PACU Anesthesia Type: General Level of consciousness: awake and alert Pain management: pain level controlled Vital Signs Assessment: post-procedure vital signs reviewed and stable Respiratory status: spontaneous breathing, nonlabored ventilation, respiratory function stable and patient connected to nasal cannula oxygen Cardiovascular status: blood pressure returned to baseline and stable Postop Assessment: no apparent nausea or vomiting Anesthetic complications: no   No complications documented.   Last Vitals:  Vitals:   01/01/21 1720 01/01/21 1731  BP: (!) 143/71 (!) 144/78  Pulse: 72 77  Resp: 17 16  Temp:  36.6 C  SpO2: 92% 91%    Last Pain:  Vitals:   01/01/21 1826  TempSrc:   PainSc: 4                  Arita Miss

## 2021-01-01 NOTE — Op Note (Signed)
DATE OF SURGERY:  01/01/2021  TIME: 4:19 PM  PATIENT NAME:  Andrea Stewart  AGE: 75 y.o.  PRE-OPERATIVE DIAGNOSIS:  Hip Fracture left  POST-OPERATIVE DIAGNOSIS:  SAME  PROCEDURE:  LEFT INTRAMEDULLARY (IM) NAIL FEMORAL  SURGEON:  Lovell Sheehan  EBL:  50 cc  COMPLICATIONS:  None apparent  OPERATIVE IMPLANTS: Synthes trochanteric femoral nail  340 mm x 9 mm  with interlocking helical blade  95 mm  PREOPERATIVE INDICATIONS:  [name] is a 75 y.o. year old who fell and suffered a hip fracture. She was brought into the ER and then admitted and optimized and then elected for surgical intervention.    The risks benefits and alternatives were discussed with the patient including but not limited to the risks of nonoperative treatment, versus surgical intervention including infection, bleeding, nerve injury, malunion, nonunion, hardware prominence, hardware failure, need for hardware removal, blood clots, cardiopulmonary complications, morbidity, mortality, among others, and they were willing to proceed.    OPERATIVE PROCEDURE:  The patient was brought to the operating room and placed in the supine position.  General anesthesia was administered. She was placed on the fracture table.  Closed reduction was performed under C-arm guidance. The length of the femur was also measured using fluoroscopy. Time out was then performed after sterile prep and drape. She received preoperative antibiotics.  Incision was made proximal to the greater trochanter. A guidewire was placed in the appropriate position. Confirmation was made on AP and lateral views. The above-named nail was opened. I opened the proximal femur with a reamer. I then placed the nail by hand easily down. I did not need to ream the femur.  Once the nail was completely seated, I placed a guidepin into the femoral head into the center center position through a second incision.  I measured the length, and then reamed the lateral  cortex and up into the head. I then placed the helical blade. Slight compression was applied. Anatomic fixation achieved. Bone quality was poor.  I then secured the proximal interlock.  I then removed the instruments, and took final C-arm pictures AP and lateral the entire length of the leg. Anatomic reconstruction was achieved, and the wounds were irrigated copiously and closed with Vicryl  followed by staples and dry sterile dressing. Sponge and needle count were correct.   The patient was awakened and returned to PACU in stable and satisfactory condition. There no complications and the patient tolerated the procedure well.  She will be weightbearing as tolerated.    Lovell Sheehan

## 2021-01-01 NOTE — Transfer of Care (Signed)
Immediate Anesthesia Transfer of Care Note  Patient: Cristle R Nurse  Procedure(s) Performed: Procedure(s): INTRAMEDULLARY (IM) NAIL FEMORAL (Left)  Patient Location: PACU  Anesthesia Type:General  Level of Consciousness: sedated  Airway & Oxygen Therapy: Patient Spontanous Breathing and Patient connected to face mask oxygen  Post-op Assessment: Report given to RN and Post -op Vital signs reviewed and stable  Post vital signs: Reviewed and stable  Last Vitals:  Vitals:   01/01/21 1405 01/01/21 1618  BP: (!) 120/94 (!) 150/76  Pulse: 71 68  Resp: 12 13  Temp:    SpO2: [id number]    Complications: No apparent anesthesia complications

## 2021-01-01 NOTE — Anesthesia Preprocedure Evaluation (Addendum)
Anesthesia Evaluation  Patient identified by MRN, date of birth, ID band Patient awake    Reviewed: Allergy & Precautions, H&P , NPO status , reviewed documented beta blocker date and time   History of Anesthesia Complications (+) Family history of anesthesia reaction  Airway Mallampati: IV  TM Distance: >3 FB Neck ROM: limited    Dental  (+) Upper Dentures, Missing   Pulmonary COPD, Current Smoker and Patient abstained from smoking.,     + decreased breath sounds      Cardiovascular hypertension, + CAD and + Peripheral Vascular Disease  Normal cardiovascular exam  2016 ECHO Study Conclusions   - Left ventricle: The cavity size was normal. There was mild  concentric hypertrophy. Systolic function was normal. The  estimated ejection fraction was in the range of 60% to 65%. Wall  motion was normal; there were no regional wall motion  abnormalities. Doppler parameters are consistent with abnormal  left ventricular relaxation (grade 1 diastolic dysfunction).  - Left atrium: The atrium was mildly dilated.  - Right ventricle: Systolic function was normal.  - Pulmonary arteries: Systolic pressure was within the normal  range.  - No Ao stenosis   Neuro/Psych PSYCHIATRIC DISORDERS Depression  Neuromuscular disease    GI/Hepatic neg GERD  ,  Endo/Other  diabetes  Renal/GU Renal disease     Musculoskeletal  (+) Arthritis ,   Abdominal   Peds  Hematology   Anesthesia Other Findings Past Medical History: No date: Backache, unspecified     Comment:  s/p c-spine and l-spine fusions No date: COPD (chronic obstructive pulmonary disease) (HCC) No date: Degeneration of intervertebral disc, site unspecified No date: Depressive disorder, not elsewhere classified No date: Disorder of bone and cartilage, unspecified No date: Family history of adverse reaction to anesthesia     Comment:  mother did, but patient not  sure of the complication No date: HTN (hypertension) No date: Hx of left heart catheterization by cutdown     Comment:  a. 7 years ago; no significant cad; b. Lexiscan 2014: no              significant ischemia, no significant EKG changes               concerning for ischemia, EF 67%, overall low risk study No date: Kidney failure     Comment:  Dr Cyndia Diver No date: Myalgia and myositis, unspecified No date: Obesity, unspecified No date: Personal history of tobacco use, presenting hazards to health     Comment:  1/2 ppd No date: Plantar fascial fibromatosis No date: S/P cholecystectomy No date: S/p nephrectomy No date: Type II or unspecified type diabetes mellitus without  mention of complication, not stated as uncontrolled No date: Unspecified essential hypertension No date: Unspecified hereditary and idiopathic peripheral neuropathy     Comment:  secondary to diabetes  Past Surgical History: No date: ABDOMINAL HYSTERECTOMY No date: APPENDECTOMY No date: BACK SURGERY     Comment:  1966 and 2006  No date: CARDIAC CATHETERIZATION     Comment:  o.k. 2003 No date: CARPAL TUNNEL RELEASE No date: CERVICAL DISCECTOMY No date: CHOLECYSTECTOMY 09/1999: COLONOSCOPY     Comment:  colonoscopy-hemorrhoids, re-check 5 years (10/2006) No date: dexa     Comment:  osteopenia (01/2004) No date: ELBOW SURGERY 07/23/2020: ESOPHAGOGASTRODUODENOSCOPY (EGD) WITH PROPOFOL; N/A     Comment:  Procedure: ESOPHAGOGASTRODUODENOSCOPY (EGD) WITH               PROPOFOL;  Surgeon: Bary Castilla,  Forest Gleason, MD;  Location:               [facility] ENDOSCOPY;  Service: Endoscopy;  Laterality: N/A;                TRAVEL TO O.R. 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 07/23/2020: LAPAROSCOPIC ABDOMINAL EXPLORATION; N/A     Comment:  Procedure: LAPAROSCOPIC ABDOMINAL EXPLORATION;  Surgeon:              Robert Bellow, MD;  Location: [facility] ORS;   Service:               General;  Laterality: N/A; No date: LUMBAR FUSION No date: NECK SURGERY     Comment:  06/2005 No date: Problem with general Anesthesia     Comment:  02/2006 10/2004: REPLACEMENT TOTAL KNEE No date: trigger finger surgery 07/23/2020: UPPER GI ENDOSCOPY; N/A     Comment:  Procedure: UPPER GI ENDOSCOPY;  Surgeon: Robert Bellow, MD;  Location: [facility] ORS;  Service: General;                Laterality: N/A; 09/08/2017: VENTRAL HERNIA REPAIR; N/A     Comment:  Primary repair of a 10 x 15 mm infraumbilical fascial               defect.  Surgeon: Robert Bellow, MD;  Location: [facility]              ORS;  Service: General;  Laterality: N/A; No date: VESICOVAGINAL FISTULA CLOSURE W/ TAH 04/02/2015: VIDEO BRONCHOSCOPY; Bilateral     Comment:  Procedure: VIDEO BRONCHOSCOPY WITHOUT FLUORO;  Surgeon:               Juanito Doom, MD;  Location: [facility] ENDOSCOPY;  Service:              Cardiopulmonary;  Laterality: Bilateral;  BMI    Body Mass Index: 31.08 kg/m      Reproductive/Obstetrics                           Anesthesia Physical Anesthesia Plan  ASA: IV  Anesthesia Plan: General   Post-op Pain Management:    Induction: Intravenous  PONV Risk Score and Plan: Treatment may vary due to age or medical condition and TIVA  Airway Management Planned: Nasal Cannula and Natural Airway  Additional Equipment:   Intra-op Plan:   Post-operative Plan:   Informed Consent: I have reviewed the patients History and Physical, chart, labs and discussed the procedure including the risks, benefits and alternatives for the proposed anesthesia with the patient or authorized representative who has indicated his/her understanding and acceptance.     Dental Advisory Given  Plan Discussed with: CRNA  Anesthesia Plan Comments: (Pt declines spinal, requests GOT, as she had in 07/2020 for ex lap. )      Anesthesia Quick Evaluation

## 2021-01-01 NOTE — Progress Notes (Signed)
Peritoneal dialysis patient known at [facility], if SNF is recommended and agree upon by patient, patient will need to be transitioned into Hemo. Meagan SW is aware of this and will inform me if this will be the plan. Please be aware that the process to transition could take up to three days to complete. Please contact me with any other dialysis placement concerns.  Elvera Bicker Dialysis Coordinator [phone]

## 2021-01-01 NOTE — Progress Notes (Signed)
Night coverage  Patient wishes to be full code. Declined placement of DNR bracelet following admission saying she wanted to be full code

## 2021-01-01 NOTE — Progress Notes (Signed)
Pt has order for DNR status. This RN explained to pt purpose of armband and DNR status. Pt then stated "No, I changed my mind about that, I want everything possible done for me". Pending O.R. this a.m. DNR armband was not applied. Text message sent to covering MD re pt comments.

## 2021-01-01 NOTE — Progress Notes (Signed)
Initial Nutrition Assessment  RD working remotely.  DOCUMENTATION CODES:   Obesity unspecified  INTERVENTION:  - will order Nepro Shake po once/day, each supplement provides 425 kcal and 19 grams protein. - will order 30 ml Prosource Plus once/day, each supplement provides 100 kcal and 15 grams protein.  - will order 1 tablet renavit/day. - complete NFPE when feasible.    NUTRITION DIAGNOSIS:   Increased nutrient needs related to chronic illness,post-op healing as evidenced by estimated needs.  GOAL:   Patient will meet greater than or equal to 90% of their needs  MONITOR:   Diet advancement,PO intake,Supplement acceptance,Labs,Weight trends  REASON FOR ASSESSMENT:   Consult Hip fracture protocol  ASSESSMENT:   75 y.o. female with medical history of ESRD on peritoneal dialysis (done nightly and her last run was the night PTA), poorly controlled type 2 DM on insulin, HTN, CAD, PAD, and COPD. She presented to the ED s/p fall while getting out of the shower.  Unable to reach patient by phone. RD not working on [employer].   Patient has been NPO since admission last night. She is pending OR this AM d/t L hip fx.   She has not been seen by a [place] RD at any time in the past.   Weight yesterday was 198 lb and PTA the most recently documented weight was on 08/26/20 when she weighed 176 lb. Non-pitting edema to LLE documented in the edema section of flow sheet.  Unable to locate documentation indicating when she began on peritoneal dialysis.    Labs reviewed; CBGs: 224, 232, 161 mg/dl, K: 3.3 mmol/l, BUN: 49 mg/dl, creatinine: 6.14 mg/dl, Ca: 8.6 mg/dl, GFR: 7 ml/min.  Medications reviewed; 80 mg oral lasix/day, sliding scale novolog, 10 units levemir BID, 5 mg oral reglan x5/day, 40 mg oral protonix BID.    NUTRITION - FOCUSED PHYSICAL EXAM:  unable to complete at this time.   Diet Order:   Diet Order            Diet NPO time specified  Diet effective  midnight                 EDUCATION NEEDS:   Not appropriate for education at this time  Skin:  Skin Assessment: Reviewed RN Assessment  Last BM:  2/16  Height:   Ht Readings from Last 1 Encounters:  12/31/20 '5\' 7"'$  (1.702 m)    Weight:   Wt Readings from Last 1 Encounters:  12/31/20 90 kg    Estimated Nutritional Needs:  Kcal:  2250-2450 kcal Protein:  110-120 grams Fluid:  UOP + 1 L/day      Jarome Matin, MS, RD, LDN, CNSC Inpatient Clinical Dietitian RD pager # available in [facility]  After hours/weekend pager # available in [facility]

## 2021-01-01 NOTE — Progress Notes (Signed)
1        [place] [facility] [place] NAME: Andrea Stewart    MR#:  [id number]  DATE OF BIRTH:  05/26/1946  SUBJECTIVE:  CHIEF COMPLAINT:   Chief Complaint  Patient presents with   Fall    Tripped on rub, no loc, no thinners, left hip pain   Complains of pain at the left hip site, waiting for surgery.  Requesting pain medicine.  Husband at bedside REVIEW OF SYSTEMS:  Review of Systems  Constitutional: Negative for diaphoresis, fever, malaise/fatigue and weight loss.  HENT: Negative for ear discharge, ear pain, hearing loss, nosebleeds, sore throat and tinnitus.   Eyes: Negative for blurred vision and pain.  Respiratory: Negative for cough, hemoptysis, shortness of breath and wheezing.   Cardiovascular: Negative for chest pain, palpitations, orthopnea and leg swelling.  Gastrointestinal: Negative for abdominal pain, blood in stool, constipation, diarrhea, heartburn, nausea and vomiting.  Genitourinary: Negative for dysuria, frequency and urgency.  Musculoskeletal: Positive for falls and joint pain. Negative for back pain and myalgias.  Skin: Negative for itching and rash.  Neurological: Negative for dizziness, tingling, tremors, focal weakness, seizures, weakness and headaches.  Psychiatric/Behavioral: Negative for depression. The patient is not nervous/anxious.    DRUG ALLERGIES:   Allergies  Allergen Reactions   Morphine Anaphylaxis   Prednisone Other (See Comments)    "makes me think I am having a heart attack"   Gabapentin Other (See Comments)    intolerance   Hydralazine Itching   Nsaids Diarrhea   Penicillins Swelling    TOLERATED CEFAZOLIN 07/2020 Has patient had a PCN reaction causing immediate rash, facial/tongue/throat swelling, SOB or lightheadedness with hypotension: Yes Has patient had a PCN reaction causing severe rash involving mucus membranes or skin necrosis: No Has patient had a PCN reaction that required hospitalization: No Has  patient had a PCN reaction occurring within the last 10 years: No If all of the above answers are "NO", then may proceed with Cephalosporin use.    VITALS:  Blood pressure (!) 120/94, pulse 71, temperature (!) 97.4 F (36.3 C), temperature source Oral, resp. rate 12, height '5\' 7"'$  (1.702 m), weight 90 kg, SpO2 99 %. PHYSICAL EXAMINATION:  Physical Exam HENT:     Head: Normocephalic and atraumatic.  Eyes:     Extraocular Movements: EOM normal.     Conjunctiva/sclera: Conjunctivae normal.     Pupils: Pupils are equal, round, and reactive to light.  Neck:     Thyroid: No thyromegaly.     Trachea: No tracheal deviation.  Cardiovascular:     Rate and Rhythm: Normal rate and regular rhythm.     Heart sounds: Normal heart sounds.  Pulmonary:     Effort: Pulmonary effort is normal. No respiratory distress.     Breath sounds: Normal breath sounds. No wheezing.  Chest:     Chest wall: No tenderness.  Abdominal:     General: Bowel sounds are normal. There is no distension.     Palpations: Abdomen is soft.     Tenderness: There is no abdominal tenderness.  Musculoskeletal:        General: Normal range of motion.     Cervical back: Normal range of motion and neck supple.     Left lower leg: Deformity and tenderness present.     Comments: Left lower extremity externally rotated and shortened  Skin:    General: Skin is warm and dry.     Findings: No rash.  Neurological:  Mental Status: She is alert and oriented to person, place, and time.     Cranial Nerves: No cranial nerve deficit.    LABORATORY PANEL:  Female CBC Recent Labs  Lab 01/01/21 0643  WBC 8.3  HGB 11.9*  HCT 36.9  PLT 218   ------------------------------------------------------------------------------------------------------------------ Chemistries  Recent Labs  Lab 12/31/20 1839 01/01/21 0643  NA 133* 135  K 3.0* 3.3*  CL 96* 98  CO2 26 27  GLUCOSE 226* 217*  BUN 42* 49*  CREATININE 5.96* 6.14*   CALCIUM 8.6* 8.6*  AST 24  --   ALT 16  --   ALKPHOS 41  --   BILITOT 0.5  --    RADIOLOGY:  DG Chest Portable 1 View  Result Date: 12/31/2020 CLINICAL DATA:  Short of breath, left hip fracture EXAM: PORTABLE CHEST 1 VIEW COMPARISON:  01/08/2019 FINDINGS: Single frontal view of the chest demonstrates an unremarkable cardiac silhouette. No airspace disease, effusion, or pneumothorax. No acute bony abnormalities. IMPRESSION: 1. No acute intrathoracic process. Electronically Signed   By: Randa Ngo M.D.   On: 12/31/2020 18:37   DG Hip Unilat With Pelvis 2-3 Views Left  Result Date: 12/31/2020 CLINICAL DATA:  [place], left leg deformity EXAM: DG HIP (WITH OR WITHOUT PELVIS) 2-3V LEFT COMPARISON:  None. FINDINGS: Frontal view of the pelvis as well as frontal and cross-table lateral views of the left hip are obtained. There is a comminuted intertrochanteric left hip fracture with impaction and mild varus angulation. No dislocation. No other acute fractures within the remainder of the pelvis or right hip. Peritoneal dialysis catheter coiled within the right lower quadrant. Postsurgical changes at the lumbosacral junction. IMPRESSION: 1. Comminuted intertrochanteric left hip fracture with mild impaction and varus angulation. Electronically Signed   By: Randa Ngo M.D.   On: 12/31/2020 18:36   ASSESSMENT AND PLAN:  75 year old female with a known history of ESRD on peritoneal dialysis, diabetes, hypertension, coronary artery disease, PAD, COPD is admitted for left hip fracture status post fall  Left hip fracture Plan for surgery later today by orthopedics.  Patient is medically clear for planned surgery Pain management and DVT prophylaxis per Ortho  ESRD-nephrology planning for PD tonight  Hypokalemia: Replete and recheck  DM2-insulin Levemir 10 units subcu twice daily and sliding scale  Anxiety-continue Xanax nightly, hydroxyzine as needed  HLD-continue  Lipitor  Hypertension-continue carvedilol  GERD-continue PPI  Gastroparesis-continue Reglan  Insomnia-continue trazodone  Obesity Body mass index is 31.08 kg/m.  Net IO Since Admission: 150 mL [01/01/21 1613]      Status is: Inpatient  Remains inpatient appropriate because:Ongoing active pain requiring inpatient pain management   Dispo: The patient is from: Home              Anticipated d/c is to: SNF              Anticipated d/c date is: > 3 days              Patient currently is not medically stable to d/c.  Undergoing hip surgery today will require 3 midnight stay before discharge to SNF   Difficult to place patient No   DVT prophylaxis:       heparin injection 5,000 Units Start: 12/31/20 2245 SCDs Start: 12/31/20 2146     Family Communication: (Updated husband at bedside on 2/17   All the records are reviewed and case discussed with Care Management/Social Worker. Management plans discussed with the patient, family and they are in  agreement.  CODE STATUS: Full Code Level of care: Med-Surg  TOTAL TIME TAKING CARE OF THIS PATIENT: 35 minutes.   More than 50% of the time was spent in counseling/coordination of care: YES  POSSIBLE D/C IN 3-4 DAYS, DEPENDING ON CLINICAL CONDITION.   Max Sane M.D on 01/01/2021 at 4:13 PM  [facility]   CC: Primary care physician; Rusty Aus, MD  Note: This dictation was prepared with Dragon dictation along with smaller phrase technology. Any transcriptional errors that result from this process are unintentional.

## 2021-01-01 NOTE — Anesthesia Procedure Notes (Signed)
Procedure Name: Intubation Date/Time: 01/01/2021 3:06 PM Performed by: Doreen Salvage, CRNA Pre-anesthesia Checklist: Patient identified, Patient being monitored, Timeout performed, Emergency Drugs available and Suction available Patient Re-evaluated:Patient Re-evaluated prior to induction Oxygen Delivery Method: Circle system utilized Preoxygenation: Pre-oxygenation with 100% oxygen Induction Type: IV induction Ventilation: Mask ventilation without difficulty Laryngoscope Size: Mac, 3 and McGraph Grade View: Grade I Tube type: Oral Tube size: 7.0 mm Number of attempts: 1 Airway Equipment and Method: Stylet Placement Confirmation: ETT inserted through vocal cords under direct vision,  positive ETCO2 and breath sounds checked- equal and bilateral Secured at: 22 cm Tube secured with: Tape Dental Injury: Teeth and Oropharynx as per pre-operative assessment

## 2021-01-01 NOTE — Plan of Care (Signed)
Received from ED via stretcher with Dx:L Introchanteric Hip Fx s/p fall at home. Oriented to ward, room, bed controls and initiated teaching re plan of care, pending surgery in a.m. and equipment and medications ordered by MD. Pt not receptive to teaching 2/2 complaints of pain. IV pain med admin, prn request. Bilat SCD on, Tele initiated and verified with central telemetry, O2 2 LPM via [place] initiated. Pure wick applied;all equipment functional. Kept NPO past mn. O.R. Consent reviewed with pt and signed by her. She denied questions or concerns.

## 2021-01-01 NOTE — Progress Notes (Addendum)
Transfer set changed from [employer] transfer set to a [employer] transfer set without any issues or concerns per aseptic technique. Treatment started without any issues. RN educated the patient on how to silence alarms and restart cycler if needed throughout the night with patient stating understanding.

## 2021-01-02 ENCOUNTER — Encounter: Payer: Self-pay | Admitting: Orthopedic Surgery

## 2021-01-02 DIAGNOSIS — J449 Chronic obstructive pulmonary disease, unspecified: Secondary | ICD-10-CM

## 2021-01-02 LAB — GLUCOSE, CAPILLARY
Glucose-Capillary: 195 mg/dL — ABNORMAL HIGH (ref 70–99)
Glucose-Capillary: 273 mg/dL — ABNORMAL HIGH (ref 70–99)
Glucose-Capillary: 274 mg/dL — ABNORMAL HIGH (ref 70–99)
Glucose-Capillary: 291 mg/dL — ABNORMAL HIGH (ref 70–99)
Glucose-Capillary: 325 mg/dL — ABNORMAL HIGH (ref 70–99)
Glucose-Capillary: 347 mg/dL — ABNORMAL HIGH (ref 70–99)
Glucose-Capillary: 368 mg/dL — ABNORMAL HIGH (ref 70–99)

## 2021-01-02 LAB — HEMOGLOBIN A1C
Hgb A1c MFr Bld: 10 % — ABNORMAL HIGH (ref 4.8–5.6)
Mean Plasma Glucose: 240.3 mg/dL

## 2021-01-02 MED ORDER — INSULIN ASPART 100 UNIT/ML ~~LOC~~ SOLN
0.0000 [IU] | Freq: Every day | SUBCUTANEOUS | Status: DC
  Administered 2021-01-02: 3 [IU] via SUBCUTANEOUS
  Administered 2021-01-04: 5 [IU] via SUBCUTANEOUS
  Administered 2021-01-07: 4 [IU] via SUBCUTANEOUS
  Filled 2021-01-02 (×3): qty 1

## 2021-01-02 MED ORDER — INSULIN DETEMIR 100 UNIT/ML ~~LOC~~ SOLN
15.0000 [IU] | Freq: Two times a day (BID) | SUBCUTANEOUS | Status: DC
  Administered 2021-01-02 – 2021-01-04 (×5): 15 [IU] via SUBCUTANEOUS
  Filled 2021-01-02 (×8): qty 0.15

## 2021-01-02 MED ORDER — INSULIN ASPART 100 UNIT/ML ~~LOC~~ SOLN
4.0000 [IU] | Freq: Three times a day (TID) | SUBCUTANEOUS | Status: DC
  Administered 2021-01-02 – 2021-01-05 (×7): 4 [IU] via SUBCUTANEOUS
  Filled 2021-01-02 (×8): qty 1

## 2021-01-02 MED ORDER — INSULIN ASPART 100 UNIT/ML ~~LOC~~ SOLN
0.0000 [IU] | Freq: Three times a day (TID) | SUBCUTANEOUS | Status: DC
  Administered 2021-01-02: 5 [IU] via SUBCUTANEOUS
  Administered 2021-01-03: 3 [IU] via SUBCUTANEOUS
  Administered 2021-01-03 – 2021-01-04 (×2): 2 [IU] via SUBCUTANEOUS
  Administered 2021-01-04: 1 [IU] via SUBCUTANEOUS
  Administered 2021-01-05: 3 [IU] via SUBCUTANEOUS
  Administered 2021-01-05: 5 [IU] via SUBCUTANEOUS
  Administered 2021-01-06: 1 [IU] via SUBCUTANEOUS
  Administered 2021-01-06: 3 [IU] via SUBCUTANEOUS
  Administered 2021-01-07 – 2021-01-08 (×2): 2 [IU] via SUBCUTANEOUS
  Administered 2021-01-09 (×2): 3 [IU] via SUBCUTANEOUS
  Filled 2021-01-02 (×12): qty 1

## 2021-01-02 MED ORDER — NICOTINE 21 MG/24HR TD PT24
21.0000 mg | MEDICATED_PATCH | Freq: Every day | TRANSDERMAL | Status: DC
  Administered 2021-01-03 – 2021-01-09 (×7): 21 mg via TRANSDERMAL
  Filled 2021-01-02 (×7): qty 1

## 2021-01-02 NOTE — Evaluation (Signed)
Physical Therapy Evaluation Patient Details Name: Andrea Stewart MRN: [id number] DOB: Oct 08, 1946 Today's Date: 01/02/2021   History of Present Illness  Per MD notes: Pt is a 75 y.o. female with history of ESRD on peritoneal dialysis, poorly controlled type 2 diabetes on insulin, hypertension, CAD, PAD, COPD, who presents after a fall.  Pt diagnosed with L hip fracture and is s/p left femoral IM nail.    Clinical Impression  Pt was pleasant and motivated to participate during the session but overall was very limited functionally.  Pt required physical assistance with bed mobility and transfers and was unable to take any steps at the EOB.  Pt was able to gently lift her L foot from the floor in standing but was unable to lift her R foot.  Pt's SpO2 was >/= 92% on room air and HR was WNL with no other adverse symptoms noted other than L hip pain. Pt will benefit from PT services in a SNF setting upon discharge to safely address deficits listed in patient problem list for decreased caregiver assistance and eventual return to PLOF.      Follow Up Recommendations SNF;Supervision for mobility/OOB    Equipment Recommendations  None recommended by PT    Recommendations for Other Services       Precautions / Restrictions Precautions Precautions: Fall Restrictions Weight Bearing Restrictions: Yes LLE Weight Bearing: Weight bearing as tolerated Other Position/Activity Restrictions: Peritoneal dialysis port RLQ      Mobility  Bed Mobility Overal bed mobility: Needs Assistance Bed Mobility: Rolling;Sidelying to Sit;Sit to Sidelying Rolling: Mod assist Sidelying to sit: Mod assist     Sit to sidelying: Max assist General bed mobility comments: Mod to max A with log rolling with rolling to the right with pillow between the knees for comfort    Transfers Overall transfer level: Needs assistance Equipment used: Rolling walker (2 wheeled) Transfers: Sit to/from Stand Sit to Stand: Min  assist;From elevated surface         General transfer comment: Mod verbal and tactile cues for sequencing  Ambulation/Gait             General Gait Details: Unable  Stairs            Wheelchair Mobility    Modified Rankin (Stroke Patients Only)       Balance Overall balance assessment: Needs assistance   Sitting balance-Leahy Scale: Normal     Standing balance support: Bilateral upper extremity supported;During functional activity Standing balance-Leahy Scale: Fair Standing balance comment: Heavy lean on the RW for support                             Pertinent Vitals/Pain Pain Assessment: 0-10 Pain Score: 2  Pain Location: L hip Pain Descriptors / Indicators: Aching;Sore Pain Intervention(s): Premedicated before session;Monitored during session    [place] expects to be discharged to:: Private residence Living Arrangements: Spouse/significant other Available Help at Discharge: Family;Available 24 hours/day Type of Home: House Home Access: Stairs to enter Entrance Stairs-Rails: Right;Left;Can reach both Entrance Stairs-Number of Steps: 4 Home Layout: One level Home Equipment: Bedside commode;Walker - 2 wheels;Walker - standard;Wheelchair - [employer] - 4 wheels      Prior Function Level of Independence: Needs assistance   Gait / Transfers Assistance Needed: Mod Ind amb with a rollator household distances, w/c in community, no other falls in the last 6 months other than current fall that occured when pt tripped  over rug  ADL's / Homemaking Assistance Needed: Pt's spouse assists with ADLs        Hand Dominance        Extremity/Trunk Assessment   Upper Extremity Assessment Upper Extremity Assessment: Overall WFL for tasks assessed    Lower Extremity Assessment Lower Extremity Assessment: Generalized weakness;LLE deficits/detail LLE: Unable to fully assess due to pain       Communication   Communication:  No difficulties  Cognition Arousal/Alertness: Awake/alert Behavior During Therapy: WFL for tasks assessed/performed Overall Cognitive Status: Within Functional Limits for tasks assessed                                        General Comments      Exercises Total Joint Exercises Ankle Circles/Pumps: AROM;Strengthening;Both;5 reps;10 reps Quad Sets: Strengthening;Both;10 reps Gluteal Sets: Strengthening;Both;10 reps Towel Squeeze: Strengthening;Both;5 reps Heel Slides: AAROM;Strengthening;Both;5 reps Hip ABduction/ADduction: Strengthening;Both;5 reps;AAROM Long Arc Quad: AROM;Strengthening;Both;10 reps;5 reps Knee Flexion: 5 reps;10 reps;Both;Strengthening;AROM Marching in Standing: AROM;Left;5 reps;Standing Other Exercises Other Exercises: HEP education for BLE APs, QS, and GS x 10 each every 1-2 hours daily   Assessment/Plan    PT Assessment Patient needs continued PT services  PT Problem List Decreased strength;Decreased activity tolerance;Decreased balance;Decreased mobility;Decreased knowledge of use of DME;Pain       PT Treatment Interventions DME instruction;Stair training;Functional mobility training;Therapeutic activities;Therapeutic exercise;Balance training;Gait training;Patient/family education    PT Goals (Current goals can be found in the Care Plan section)  Acute Rehab PT Goals Patient Stated Goal: To be able to get around the house with my walker PT Goal Formulation: With patient Time For Goal Achievement: 01/15/21 Potential to Achieve Goals: Good    Frequency BID   Barriers to discharge Inaccessible home environment      Co-evaluation               AM-PAC PT "6 Clicks" Mobility  Outcome Measure Help needed turning from your back to your side while in a flat bed without using bedrails?: A Lot Help needed moving from lying on your back to sitting on the side of a flat bed without using bedrails?: A Lot Help needed moving to and  from a bed to a chair (including a wheelchair)?: A Lot Help needed standing up from a chair using your arms (e.g., wheelchair or bedside chair)?: A Lot Help needed to walk in hospital room?: Total Help needed climbing 3-5 steps with a railing? : Total 6 Click Score: 10    End of Session Equipment Utilized During Treatment: Gait belt Activity Tolerance: Patient limited by pain Patient left: in bed;with call bell/phone within reach;with family/visitor present;with bed alarm set;with SCD's reapplied Nurse Communication: Mobility status;Weight bearing status PT Visit Diagnosis: Other abnormalities of gait and mobility (R26.89);Muscle weakness (generalized) (M62.81);Pain Pain - Right/Left: Left Pain - part of body: Hip    Time: [id number] PT Time Calculation (min) (ACUTE ONLY): 45 min   Charges:   PT Evaluation $PT Eval Moderate Complexity: 1 Mod PT Treatments $Therapeutic Exercise: 8-22 mins        D. Scott Copelyn Widmer PT, DPT 01/02/21, 1:44 PM

## 2021-01-02 NOTE — Progress Notes (Signed)
Physical Therapy Treatment Patient Details Name: Andrea Stewart MRN: [id number] DOB: 03-29-1946 Today's Date: 01/02/2021    History of Present Illness Per MD notes: Pt is a 75 y.o. female with history of ESRD on peritoneal dialysis, poorly controlled type 2 diabetes on insulin, hypertension, CAD, PAD, COPD, who presents after a fall.  Pt diagnosed with L hip fracture and is s/p left femoral IM nail.    PT Comments    Pt was pleasant and motivated to participate during the session.  Pt continued to require physical assistance with bed mobility tasks and transfers.  Extensive attempts made for pt to ambulate at the EOB once in standing but pt unable to do so; pt was able to lift her L foot gently from the floor but unable to even shuffle her R foot on the floor.  Pt reported no adverse symptoms during the session other than L hip pain with weight bearing.  Pt will benefit from PT services in a SNF setting upon discharge to safely address deficits listed in patient problem list for decreased caregiver assistance and eventual return to PLOF.     Follow Up Recommendations  SNF;Supervision for mobility/OOB     Equipment Recommendations  None recommended by PT    Recommendations for Other Services       Precautions / Restrictions Precautions Precautions: Fall Restrictions Weight Bearing Restrictions: Yes LLE Weight Bearing: Weight bearing as tolerated Other Position/Activity Restrictions: Peritoneal dialysis port RLQ    Mobility  Bed Mobility Overal bed mobility: Needs Assistance Bed Mobility: Rolling;Sidelying to Sit;Sit to Sidelying Rolling: Mod assist Sidelying to sit: Mod assist     Sit to sidelying: Max assist General bed mobility comments: Mod to max A with log rolling with rolling to the right with pillow between the knees for comfort    Transfers Overall transfer level: Needs assistance Equipment used: Rolling walker (2 wheeled) Transfers: Sit to/from Stand Sit to  Stand: Min assist;From elevated surface         General transfer comment: Mod verbal and tactile cues for sequencing most notably for hand and foot placement  Ambulation/Gait             General Gait Details: Extensive attempts made for pt to ambulate at the EOB but pt unable to do so; Pt able to lift her L foot gently from the floor but unable to even shuffle her R foot on the floor.   Stairs             Wheelchair Mobility    Modified Rankin (Stroke Patients Only)       Balance Overall balance assessment: Needs assistance   Sitting balance-Leahy Scale: Normal     Standing balance support: Bilateral upper extremity supported;During functional activity Standing balance-Leahy Scale: Fair Standing balance comment: Heavy lean on the RW for support                            Cognition Arousal/Alertness: Awake/alert Behavior During Therapy: WFL for tasks assessed/performed Overall Cognitive Status: Within Functional Limits for tasks assessed                                        Exercises Total Joint Exercises Ankle Circles/Pumps: AROM;Strengthening;Both;10 reps Quad Sets: Strengthening;Both;10 reps Gluteal Sets: Strengthening;Both;10 reps Towel Squeeze: Strengthening;Both;5 reps Heel Slides: AAROM;Strengthening;Both;5 reps Hip ABduction/ADduction: Strengthening;Both;AAROM;10 reps (  AAROM on the LLE) Long Arc Quad: AROM;Strengthening;Both;10 reps;5 reps Knee Flexion: 5 reps;10 reps;Both;Strengthening;AROM Marching in Standing: AROM;Left;5 reps;Standing Other Exercises Other Exercises: HEP education for BLE APs, QS, and GS x 10 each every 1-2 hours daily    General Comments        Pertinent Vitals/Pain Pain Assessment: No/denies pain Pain Score: 2  Pain Location: L hip; no pain at rest, guarding and grimacing with weight bearing. Pain Descriptors / Indicators: Aching;Sore Pain Intervention(s): Premedicated before  session;Monitored during session    [place] expects to be discharged to:: Private residence Living Arrangements: Spouse/significant other Available Help at Discharge: Family;Available 24 hours/day Type of Home: House Home Access: Stairs to enter Entrance Stairs-Rails: Right;Left;Can reach both Home Layout: One level Home Equipment: Bedside commode;Walker - 2 wheels;Walker - standard;Wheelchair - [employer] - 4 wheels      Prior Function Level of Independence: Needs assistance  Gait / Transfers Assistance Needed: Mod Ind amb with a rollator household distances, w/c in community, no other falls in the last 6 months other than current fall that occured when pt tripped over rug ADL's / Homemaking Assistance Needed: Pt's spouse assists with ADLs     PT Goals (current goals can now be found in the care plan section) Acute Rehab PT Goals Patient Stated Goal: To be able to get around the house with my walker PT Goal Formulation: With patient Time For Goal Achievement: 01/15/21 Potential to Achieve Goals: Good Progress towards PT goals: Progressing toward goals    Frequency    BID      PT Plan Current plan remains appropriate    Co-evaluation              AM-PAC PT "6 Clicks" Mobility   Outcome Measure  Help needed turning from your back to your side while in a flat bed without using bedrails?: A Lot Help needed moving from lying on your back to sitting on the side of a flat bed without using bedrails?: A Lot Help needed moving to and from a bed to a chair (including a wheelchair)?: Total Help needed standing up from a chair using your arms (e.g., wheelchair or bedside chair)?: A Little Help needed to walk in hospital room?: Total Help needed climbing 3-5 steps with a railing? : Total 6 Click Score: 10    End of Session Equipment Utilized During Treatment: Gait belt Activity Tolerance: Patient limited by pain Patient left: in bed;with call  bell/phone within reach;with family/visitor present;with bed alarm set;with SCD's reapplied Nurse Communication: Mobility status;Weight bearing status PT Visit Diagnosis: Other abnormalities of gait and mobility (R26.89);Muscle weakness (generalized) (M62.81);Pain Pain - Right/Left: Left Pain - part of body: Hip     Time: [id number] PT Time Calculation (min) (ACUTE ONLY): 27 min  Charges:  $Therapeutic Exercise: 8-22 mins $Therapeutic Activity: 8-22 mins                     D. Scott Cordero Surette PT, DPT 01/02/21, 3:54 PM

## 2021-01-02 NOTE — Progress Notes (Signed)
Inpatient Diabetes Program Recommendations  AACE/ADA: New Consensus Statement on Inpatient Glycemic Control (2015)  Target Ranges:  Prepandial:   less than 140 mg/dL      Peak postprandial:   less than 180 mg/dL (1-2 hours)      Critically ill patients:  140 - 180 mg/dL   Results for Andrea Stewart, Andrea Stewart (MRN [id number]) as of 01/02/2021 13:19  Ref. Range 01/02/2021 00:26 01/02/2021 04:25 01/02/2021 08:07 01/02/2021 12:08  Glucose-Capillary Latest Ref Range: 70 - 99 mg/dL 347 (H)  4 units NOVOLOG  368 (H)  5 units NOVOLOG  291 (H)  3 units NOVOLOG  10 units LEVEMIR  325 (H)  4 units NOVOLOG    Home DM Meds: NPH Insulin 20 units AM/ 30 units PM       Humalog 2-6 units TID per SSI  Current Orders: Levemir 10 units BID       Novolog 0-6 units Q4H   Pt got 10 mg Decadron X 1 dose yest at 3pm--CBGs elevated after this   MD- Please consider:  1. Increase Levemir to 15 units BID  2. Change frequency of the Novolog SSI to TID AC + HS  3. Start Novolog Meal Coverage: Novolog 4 units TID with meals Hold if pt eats <50% of meal, Hold if pt NPO   --Will follow patient during hospitalization--  Wyn Quaker RN, MSN, CDE Diabetes Coordinator Inpatient Glycemic Control Team Team Pager: [phone] (8a-5p)

## 2021-01-02 NOTE — NC FL2 (Signed)
[place] LEVEL OF CARE SCREENING TOOL     IDENTIFICATION  Patient Name: Andrea Stewart Birthdate: 1946/09/19 Sex: female Admission Date (Current Location): 12/31/2020  [place] and [place] Number:  Engineering geologist and Address:  [facility], [address] [place], [place] [address]      Provider Number: [id number]  Attending Physician Name and Address:  Max Sane, MD  Relative Name and Phone Number:  Derri, Demeyer (Spouse)   [phone] (Work Phone)    Current Level of Care: Hospital Recommended Level of Care: [place] Prior Approval Number:    Date Approved/Denied:   PASRR Number: pending  Discharge Plan: SNF    Current Diagnoses: Patient Active Problem List   Diagnosis Date Noted  . Hip fracture ([place]) 12/31/2020  . Insomnia 09/13/2019  . Pruritus 09/13/2019  . PAD (peripheral artery disease) ([place]) 05/30/2018  . Family history of colon cancer requiring screening colonoscopy 04/06/2018  . Incarcerated ventral hernia 09/07/2017  . Ventral hernia 09/07/2017  . Atherosclerosis of native arteries of extremity with intermittent claudication ([place]) 06/21/2017  . Morbid obesity ([place]) 10/21/2016  . Edema 06/19/2016  . Tobacco abuse counseling 12/17/2015  . Tobacco use disorder 12/11/2015  . Other specified postprocedural states 08/07/2015  . CKD (chronic kidney disease), stage III ([place]) 05/13/2015  . COPD (chronic obstructive pulmonary disease) with chronic bronchitis ([place]) 01/21/2015  . Abnormal findings on diagnostic imaging of other specified body structures 01/21/2015  . Thyroid mass 12/13/2014  . Hx of left heart catheterization by cutdown   . Single kidney 09/30/2014  . Encounter for general adult medical examination without abnormal findings 03/03/2014  . Personal history of diseases of the blood and blood-forming organs and certain disorders involving the immune mechanism 03/03/2014  . Pain of  right leg 01/28/2014  . Irritable bowel syndrome 09/09/2012  . Coronary atherosclerosis 07/26/2009  . Diabetes mellitus type 2 with complications, uncontrolled ([place]) 03/09/2007  . DEPRESSION 03/09/2007  . PERIPHERAL NEUROPATHY 03/09/2007  . Essential hypertension 03/09/2007  . DEGENERATIVE DISC DISEASE 03/09/2007  . BACK PAIN, CHRONIC 03/09/2007  . FIBROMYALGIA 03/09/2007  . Disorder of bone 03/09/2007  . Major depressive disorder, single episode 03/09/2007  . Narrowing of intervertebral disc space 03/09/2007  . 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 History of total knee replacement, right 08/04/2006  . History of total knee replacement, left 11/10/2004    Orientation RESPIRATION BLADDER Height & Weight     Self,Time,Situation,Place  O2 External catheter Weight: 177 lb 4 oz (80.4 kg) Height:  '5\' 7"'$  (170.2 cm)  BEHAVIORAL SYMPTOMS/MOOD NEUROLOGICAL BOWEL NUTRITION STATUS        Diet (carb modified, thin liquids)  AMBULATORY STATUS COMMUNICATION OF NEEDS Skin   Extensive Assist Verbally  (surgical incision L hip)                       Personal Care Assistance Level of Assistance  Bathing,Feeding,Dressing Bathing Assistance: Maximum assistance Feeding assistance: Independent Dressing Assistance: Maximum assistance     Functional Limitations Info             SPECIAL CARE FACTORS FREQUENCY  PT (By licensed PT),OT (By licensed OT)     PT Frequency: 5 x/week OT Frequency: 5 x/week            Contractures      Additional Factors Info  Code Status,Allergies Code Status Info: full code Allergies Info: morphine, prednisone, gabapentin, hydralazine, nsaids, penicillins  Current Medications (01/02/2021):  This is the current hospital active medication list Current Facility-Administered Medications  Medication Dose Route Frequency Provider Last Rate Last Admin  . (feeding supplement) PROSource Plus liquid 30 mL  30 mL Oral Daily Lovell Sheehan,  MD   30 mL at 01/01/21 2138  . 0.9 %  sodium chloride infusion   Intravenous Continuous Lovell Sheehan, MD   Stopped at 01/01/21 1614  . ALPRAZolam Duanne Moron) tablet 0.5 mg  0.5 mg Oral QHS PRN Lovell Sheehan, MD      . atorvastatin (LIPITOR) tablet 20 mg  20 mg Oral Daily Lovell Sheehan, MD   20 mg at 01/02/21 0820  . carvedilol (COREG) tablet 25 mg  25 mg Oral BID Lovell Sheehan, MD   25 mg at 01/02/21 0820  . dialysis solution 1.5% low-MG/low-CA dianeal solution   Intraperitoneal Q24H Kolluru, Sarath, MD      . docusate sodium (COLACE) capsule 100 mg  100 mg Oral BID Lovell Sheehan, MD   100 mg at 01/02/21 [id number]  . feeding supplement (NEPRO CARB STEADY) liquid 237 mL  237 mL Oral Q24H Lovell Sheehan, MD   237 mL at 01/02/21 1051  . furosemide (LASIX) tablet 80 mg  80 mg Oral Daily Lovell Sheehan, MD   80 mg at 01/02/21 [id number]  . gentamicin cream (GARAMYCIN) 0.1 % 1 application  1 application Topical Daily Kolluru, Sarath, MD   1 application at 0000000 0822  . heparin injection 5,000 Units  5,000 Units Subcutaneous Q8H Lovell Sheehan, MD   5,000 Units at 01/02/21 0541  . hydrOXYzine (ATARAX/VISTARIL) tablet 25 mg  25 mg Oral BID PRN Lovell Sheehan, MD      . insulin aspart (novoLOG) injection 0-5 Units  0-5 Units Subcutaneous QHS Manuella Ghazi, Vipul, MD      . insulin aspart (novoLOG) injection 0-9 Units  0-9 Units Subcutaneous TID WC Manuella Ghazi, Vipul, MD      . insulin aspart (novoLOG) injection 4 Units  4 Units Subcutaneous TID WC Manuella Ghazi, Vipul, MD      . insulin detemir (LEVEMIR) injection 15 Units  15 Units Subcutaneous BID AC & HS Manuella Ghazi, Vipul, MD      . metoCLOPramide (REGLAN) tablet 5-10 mg  5-10 mg Oral Q8H PRN Lovell Sheehan, MD       Or  . metoCLOPramide (REGLAN) injection 5-10 mg  5-10 mg Intravenous Q8H PRN Lovell Sheehan, MD      . metoCLOPramide (REGLAN) tablet 5 mg  5 mg Oral 5 X Daily Lovell Sheehan, MD   5 mg at 01/02/21 0820  . multivitamin (RENA-VIT) tablet 1 tablet  1 tablet Oral  Daily Lovell Sheehan, MD   1 tablet at 01/02/21 [phone]  . ondansetron (ZOFRAN) tablet 4 mg  4 mg Oral Q6H PRN Lovell Sheehan, MD       Or  . ondansetron [facility]) injection 4 mg  4 mg Intravenous Q6H PRN Lovell Sheehan, MD      . oxyCODONE (Oxy IR/ROXICODONE) immediate release tablet 5 mg  5 mg Oral Q4H PRN Lovell Sheehan, MD   5 mg at 01/02/21 1223  . pantoprazole (PROTONIX) EC tablet 40 mg  40 mg Oral BID Lovell Sheehan, MD   40 mg at 01/02/21 [id number]  . polyethylene glycol (MIRALAX / GLYCOLAX) packet 17 g  17 g Oral Daily PRN Lovell Sheehan, MD      .  traZODone (DESYREL) tablet 25-50 mg  25-50 mg Oral QHS PRN Lovell Sheehan, MD   50 mg at 01/01/21 2131     Discharge Medications: Please see discharge summary for a list of discharge medications.  Relevant Imaging Results:  Relevant Lab Results:   Additional Information SS #: [id number]  [place], LCSW

## 2021-01-02 NOTE — Progress Notes (Signed)
Subjective:  Patient reports pain as mild.    Objective:   VITALS:   Vitals:   01/02/21 0500 01/02/21 0505 01/02/21 0806 01/02/21 1206  BP:  131/69 109/74 109/61  Pulse:  81 80 77  Resp:  '18 17 17  '$ Temp:  (!) 97.5 F (36.4 C) (!) 97.5 F (36.4 C) 97.6 F (36.4 C)  TempSrc:   Oral Oral  SpO2:  98% 94% 100%  Weight: 80.4 kg     Height:        PHYSICAL EXAM:  Neurologically intact ABD soft Neurovascular intact Sensation intact distally Intact pulses distally Dorsiflexion/Plantar flexion intact Incision: dressing C/D/I No cellulitis present Compartment soft  LABS  Results for orders placed or performed during the hospital encounter of 12/31/20 (from the past 24 hour(s))  Glucose, capillary     Status: Abnormal   Collection Time: 01/01/21  4:20 PM  Result Value Ref Range   Glucose-Capillary 111 (H) 70 - 99 mg/dL  Glucose, capillary     Status: Abnormal   Collection Time: 01/01/21  5:33 PM  Result Value Ref Range   Glucose-Capillary 105 (H) 70 - 99 mg/dL  Glucose, capillary     Status: Abnormal   Collection Time: 01/01/21  9:19 PM  Result Value Ref Range   Glucose-Capillary 239 (H) 70 - 99 mg/dL  Glucose, capillary     Status: Abnormal   Collection Time: 01/02/21 12:26 AM  Result Value Ref Range   Glucose-Capillary 347 (H) 70 - 99 mg/dL  Glucose, capillary     Status: Abnormal   Collection Time: 01/02/21  4:25 AM  Result Value Ref Range   Glucose-Capillary 368 (H) 70 - 99 mg/dL  Glucose, capillary     Status: Abnormal   Collection Time: 01/02/21  8:07 AM  Result Value Ref Range   Glucose-Capillary 291 (H) 70 - 99 mg/dL   Comment 1 Notify RN   Glucose, capillary     Status: Abnormal   Collection Time: 01/02/21 12:08 PM  Result Value Ref Range   Glucose-Capillary 325 (H) 70 - 99 mg/dL   Comment 1 Notify RN     DG Chest Portable 1 View  Result Date: 12/31/2020 CLINICAL DATA:  Short of breath, left hip fracture EXAM: PORTABLE CHEST 1 VIEW COMPARISON:   01/08/2019 FINDINGS: Single frontal view of the chest demonstrates an unremarkable cardiac silhouette. No airspace disease, effusion, or pneumothorax. No acute bony abnormalities. IMPRESSION: 1. No acute intrathoracic process. Electronically Signed   By: Randa Ngo M.D.   On: 12/31/2020 18:37   DG HIP OPERATIVE UNILAT W OR W/O PELVIS LEFT  Result Date: 01/01/2021 CLINICAL DATA:  Left hip surgery. EXAM: OPERATIVE LEFT HIP (WITH PELVIS IF PERFORMED) 3 VIEWS TECHNIQUE: Fluoroscopic spot image(s) were submitted for interpretation post-operatively. COMPARISON:  12/31/2020. FINDINGS: ORIF left hip. Hardware intact. Anatomic alignment. Left knee replacement. Three images obtained. Peripheral vascular calcification. 0 minutes 30 seconds fluoroscopy time. Three images obtained. IMPRESSION: ORIF left hip.  Anatomic alignment. Electronically Signed   By: Marcello Moores  Register   On: 01/01/2021 16:33   DG Hip Unilat With Pelvis 2-3 Views Left  Result Date: 12/31/2020 CLINICAL DATA:  [place], left leg deformity EXAM: DG HIP (WITH OR WITHOUT PELVIS) 2-3V LEFT COMPARISON:  None. FINDINGS: Frontal view of the pelvis as well as frontal and cross-table lateral views of the left hip are obtained. There is a comminuted intertrochanteric left hip fracture with impaction and mild varus angulation. No dislocation. No other acute fractures within  the remainder of the pelvis or right hip. Peritoneal dialysis catheter coiled within the right lower quadrant. Postsurgical changes at the lumbosacral junction. IMPRESSION: 1. Comminuted intertrochanteric left hip fracture with mild impaction and varus angulation. Electronically Signed   By: Randa Ngo M.D.   On: 12/31/2020 18:36    Assessment/Plan: 1 Day Post-Op   Active Problems:   Diabetes mellitus type 2 with complications, uncontrolled ([place])   Essential hypertension   Coronary atherosclerosis   Single kidney   COPD (chronic obstructive pulmonary disease) with chronic  bronchitis (HCC)   CKD (chronic kidney disease), stage III (HCC)   Morbid obesity ([place])   Atherosclerosis of native arteries of extremity with intermittent claudication (HCC)   PAD (peripheral artery disease) ([place])   History of total knee replacement, left   History of total knee replacement, right   Hip fracture ([place])   Advance diet Up with therapy  Discharge per medicine WBAT in [facility] Will continue to follow    Carlynn Spry , PA-C 01/02/2021, 2:20 PM

## 2021-01-02 NOTE — Progress Notes (Signed)
RE: Andrea Stewart Date of Birth: [birth year] Date: 01/02/21   To Whom It May Concern:  Please be advised that the above-named patient will require a short-term nursing home stay - anticipated 30 days or less for rehabilitation and strengthening.  The plan is for return home.

## 2021-01-02 NOTE — Progress Notes (Signed)
1        [facility] NAME: Andrea Stewart    MR#:  [id number]  DATE OF BIRTH:  [birth year]  SUBJECTIVE:  CHIEF COMPLAINT:   Chief Complaint  Patient presents with  . Fall    Tripped on rub, no loc, no thinners, left hip pain   Pain is much better controlled status post surgery yesterday.  Husband at bedside.  Patient is requesting to see if she would be able to go home instead of rehab REVIEW OF SYSTEMS:  Review of Systems  Constitutional: Negative for diaphoresis, fever, malaise/fatigue and weight loss.  HENT: Negative for ear discharge, ear pain, hearing loss, nosebleeds, sore throat and tinnitus.   Eyes: Negative for blurred vision and pain.  Respiratory: Negative for cough, hemoptysis, shortness of breath and wheezing.   Cardiovascular: Negative for chest pain, palpitations, orthopnea and leg swelling.  Gastrointestinal: Negative for abdominal pain, blood in stool, constipation, diarrhea, heartburn, nausea and vomiting.  Genitourinary: Negative for dysuria, frequency and urgency.  Musculoskeletal: Positive for joint pain. Negative for back pain and myalgias.  Skin: Negative for itching and rash.  Neurological: Negative for dizziness, tingling, tremors, focal weakness, seizures, weakness and headaches.  Psychiatric/Behavioral: Negative for depression. The patient is not nervous/anxious.    DRUG ALLERGIES:   Allergies  Allergen Reactions  . Morphine Anaphylaxis  . Prednisone Other (See Comments)    "makes me think I am having a heart attack"  . Gabapentin Other (See Comments)    intolerance  . Hydralazine Itching  . Nsaids Diarrhea  . Penicillins Swelling    TOLERATED CEFAZOLIN 07/2020 Has patient had a PCN reaction causing immediate rash, facial/tongue/throat swelling, SOB or lightheadedness with hypotension: Yes Has patient had a PCN reaction causing severe rash involving mucus membranes or skin necrosis: No Has patient had a PCN  reaction that required hospitalization: No Has patient had a PCN reaction occurring within the last 10 years: No If all of the above answers are "NO", then may proceed with Cephalosporin use.    VITALS:  Blood pressure 109/61, pulse 77, temperature 97.6 F (36.4 C), temperature source Oral, resp. rate 17, height '5\' 7"'$  (1.702 m), weight 80.4 kg, SpO2 100 %. PHYSICAL EXAMINATION:  Physical Exam HENT:     Head: Normocephalic and atraumatic.  Eyes:     Conjunctiva/sclera: Conjunctivae normal.     Pupils: Pupils are equal, round, and reactive to light.  Neck:     Thyroid: No thyromegaly.     Trachea: No tracheal deviation.  Cardiovascular:     Rate and Rhythm: Normal rate and regular rhythm.     Heart sounds: Normal heart sounds.  Pulmonary:     Effort: Pulmonary effort is normal. No respiratory distress.     Breath sounds: Normal breath sounds. No wheezing.  Chest:     Chest wall: No tenderness.  Abdominal:     General: Bowel sounds are normal. There is no distension.     Palpations: Abdomen is soft.     Tenderness: There is no abdominal tenderness.  Musculoskeletal:        General: Normal range of motion.     Cervical back: Normal range of motion and neck supple.     Comments: Surgical site/sutures in place without any signs of erythema or infection  Skin:    General: Skin is warm and dry.     Findings: No rash.  Neurological:     Mental Status: She is  alert and oriented to person, place, and time.     Cranial Nerves: No cranial nerve deficit.    LABORATORY PANEL:  Female CBC Recent Labs  Lab 01/01/21 0643  WBC 8.3  HGB 11.9*  HCT 36.9  PLT 218   ------------------------------------------------------------------------------------------------------------------ Chemistries  Recent Labs  Lab 12/31/20 1839 01/01/21 0643  NA 133* 135  K 3.0* 3.3*  CL 96* 98  CO2 26 27  GLUCOSE 226* 217*  BUN 42* 49*  CREATININE 5.96* 6.14*  CALCIUM 8.6* 8.6*  AST 24  --   ALT  16  --   ALKPHOS 41  --   BILITOT 0.5  --    RADIOLOGY:  DG HIP OPERATIVE UNILAT W OR W/O PELVIS LEFT  Result Date: 01/01/2021 CLINICAL DATA:  Left hip surgery. EXAM: OPERATIVE LEFT HIP (WITH PELVIS IF PERFORMED) 3 VIEWS TECHNIQUE: Fluoroscopic spot image(s) were submitted for interpretation post-operatively. COMPARISON:  12/31/2020. FINDINGS: ORIF left hip. Hardware intact. Anatomic alignment. Left knee replacement. Three images obtained. Peripheral vascular calcification. 0 minutes 30 seconds fluoroscopy time. Three images obtained. IMPRESSION: ORIF left hip.  Anatomic alignment. Electronically Signed   By: Marcello Moores  Register   On: 01/01/2021 16:33   ASSESSMENT AND PLAN:  75 year old female with a known history of ESRD on peritoneal dialysis, diabetes, hypertension, coronary artery disease, PAD, COPD is admitted for left hip fracture status post fall  Left hip fracture Status post intramedullary nail on 2/17 Pain management and DVT prophylaxis per Ortho Start PT and OT -patient would prefer to go home instead of rehab which will help maintain her peritoneal dialysis also if not she will have to be converted to hemodialysis for SNF per nephrology  ESRD-PD per nephrology  Chronic diastolic CHF Continue Lasix 80 mg once daily  Hypokalemia: Replete and recheck  DM2-increase insulin Levemir 15 units subcu twice daily and continue sliding scale Adding NovoLog 4 units 3 times daily with meals per diabetic nurse recommendation  Anxiety-continue Xanax nightly, hydroxyzine as needed  HLD-continue Lipitor  Essential hypertension-continue carvedilol  GERD-continue PPI  Gastroparesis-continue Reglan  Insomnia-continue trazodone  Obesity Body mass index is 27.76 kg/m.  Net IO Since Admission: 7,370 mL [01/02/21 1325]      Status is: Inpatient  Remains inpatient appropriate because:Ongoing active pain requiring inpatient pain management   Dispo: The patient is from:  Home              Anticipated d/c is to: SNF versus home with home health (patient preference)              Anticipated d/c date is: 3 days              Patient currently is not medically stable to d/c.  Depending on her progress with PT, OT disposition will be decided   Difficult to place patient No   DVT prophylaxis:       SCDs Start: 01/01/21 1734 heparin injection 5,000 Units Start: 12/31/20 2245 SCDs Start: 12/31/20 2146     Family Communication: Updated husband at bedside on 2/18   All the records are reviewed and case discussed with Care Management/Social Worker. Management plans discussed with the patient, family and they are in agreement.  CODE STATUS: Full Code Level of care: Med-Surg  TOTAL TIME TAKING CARE OF THIS PATIENT: 35 minutes.   More than 50% of the time was spent in counseling/coordination of care: YES  POSSIBLE D/C IN 2-3 DAYS, DEPENDING ON CLINICAL CONDITION.   Max Sane M.D on  01/02/2021 at 1:25 PM  [facility]   CC: Primary care physician; Rusty Aus, MD  Note: This dictation was prepared with Dragon dictation along with smaller phrase technology. Any transcriptional errors that result from this process are unintentional.

## 2021-01-02 NOTE — TOC Initial Note (Addendum)
Transition of Care [facility]) - Initial/Assessment Note    Patient Details  Name: Andrea Stewart MRN: [id number] Date of Birth: February 16, 1946  [facility] CM/SW Contact:    Magnus Ivan, LCSW Phone Number: 01/02/2021, 2:35 PM  Clinical Narrative:                CSW spoke with patient. Patient lives with her husband who provides transportation. PCP is Dr. Sabra Heck. Pharmacy is [facility]. Patient has a BSC, shower chair, and RW. Patient went to [facility] for OPPT in the past. No SNF history. Patient is agreeable to SNF recommendation if still needed at time of DC. She reported she prefers [facility]. Asked Magda Paganini to review referral. Does not want [facility]. CSW starting SNF work up.   2:45- Updated Estill Bamberg with D[facility] of possible SNF, but hope for home with [facility] next week if possible per MD.  Expected Discharge Plan: Skilled Nursing Facility Barriers to Discharge: Continued Medical Work up   Patient Goals and CMS Choice Patient states their goals for this hospitalization and ongoing recovery are:: SNF rehab CMS Medicare.gov Compare Post Acute Care list provided to:: Patient Choice offered to / list presented to : Patient  Expected Discharge Plan and Services Expected Discharge Plan: [place]       Living arrangements for the past 2 months: Single Family Home                                      Prior Living Arrangements/Services Living arrangements for the past 2 months: Single Family Home Lives with:: Spouse Patient language and need for interpreter reviewed:: Yes Do you feel safe going back to the place where you live?: Yes      Need for Family Participation in Patient Care: Yes (Comment) Care giver support system in place?: Yes (comment) Current home services: DME Criminal Activity/Legal Involvement Pertinent to Current Situation/Hospitalization: No - Comment as needed  Activities of Daily Living Home Assistive  Devices/Equipment: Walker (specify type) ADL Screening (condition at time of admission) Patient's cognitive ability adequate to safely complete daily activities?: No Is the patient deaf or have difficulty hearing?: No Does the patient have difficulty seeing, even when wearing glasses/contacts?: No Does the patient have difficulty concentrating, remembering, or making decisions?: Yes Patient able to express need for assistance with ADLs?: Yes Does the patient have difficulty dressing or bathing?: Yes Independently performs ADLs?: No Communication: Appropriate for developmental age Dressing (OT): Needs assistance Grooming: Needs assistance Feeding: Needs assistance Bathing: Needs assistance Toileting: Needs assistance Is this a change from baseline?: Change from baseline, expected to last >3days In/Out Bed: Needs assistance Is this a change from baseline?: Change from baseline, expected to last >3 days Walks in Home: Independent with device (comment) Does the patient have difficulty walking or climbing stairs?: Yes Weakness of Legs: Left Weakness of Arms/Hands: None  Permission Sought/Granted Permission sought to share information with : Facility Retail banker granted to share information with : Yes, Verbal Permission Granted     Permission granted to share info w AGENCY: SNFs        Emotional Assessment       Orientation: : Oriented to Self,Oriented to Place,Oriented to Situation,Oriented to  Time Alcohol / Substance Use: Not Applicable Psych Involvement: No (comment)  Admission diagnosis:  Hip fracture ([place]) [S72.009A] Fall, initial encounter [W19.XXXA] Closed 2-part intertrochanteric fracture of  left femur, initial encounter [facility]) [S72.142A] Patient Active Problem List   Diagnosis Date Noted  . Hip fracture ([place]) 12/31/2020  . Insomnia 09/13/2019  . Pruritus 09/13/2019  . PAD (peripheral artery disease) ([place]) 05/30/2018  . Family  history of colon cancer requiring screening colonoscopy 04/06/2018  . Incarcerated ventral hernia 09/07/2017  . Ventral hernia 09/07/2017  . Atherosclerosis of native arteries of extremity with intermittent claudication ([place]) 06/21/2017  . Morbid obesity ([place]) 10/21/2016  . Edema 06/19/2016  . Tobacco abuse counseling 12/17/2015  . Tobacco use disorder 12/11/2015  . Other specified postprocedural states 08/07/2015  . CKD (chronic kidney disease), stage III ([place]) 05/13/2015  . COPD (chronic obstructive pulmonary disease) with chronic bronchitis ([place]) 01/21/2015  . Abnormal findings on diagnostic imaging of other specified body structures 01/21/2015  . Thyroid mass 12/13/2014  . Hx of left heart catheterization by cutdown   . Single kidney 09/30/2014  . Encounter for general adult medical examination without abnormal findings 03/03/2014  . Personal history of diseases of the blood and blood-forming organs and certain disorders involving the immune mechanism 03/03/2014  . Pain of right leg 01/28/2014  . Irritable bowel syndrome 09/09/2012  . Coronary atherosclerosis 07/26/2009  . Diabetes mellitus type 2 with complications, uncontrolled ([place]) 03/09/2007  . DEPRESSION 03/09/2007  . PERIPHERAL NEUROPATHY 03/09/2007  . Essential hypertension 03/09/2007  . DEGENERATIVE DISC DISEASE 03/09/2007  . BACK PAIN, CHRONIC 03/09/2007  . FIBROMYALGIA 03/09/2007  . Disorder of bone 03/09/2007  . Major depressive disorder, single episode 03/09/2007  . Narrowing of intervertebral disc space 03/09/2007  . 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 History of total knee replacement, right 08/04/2006  . History of total knee replacement, left 11/10/2004   PCP:  Rusty Aus, MD Pharmacy:   [facility], [name] - [address] [place] [address] Phone: [phone] Fax: [phone]     Social Determinants of Health (SDOH) Interventions    Readmission  Risk Interventions No flowsheet data found.

## 2021-01-02 NOTE — Care Management Important Message (Signed)
Important Message  Patient Details  Name: CATASHA BANWART MRN: [id number] Date of Birth: 1946-08-15   Medicare Important Message Given:  N/A - LOS <3 / Initial given by admissions     Juliann Pulse A Mickeal Daws 01/02/2021, 1:11 PM

## 2021-01-02 NOTE — Progress Notes (Signed)
[place], [name] 01/02/21  Subjective:   Andrea Stewart is a 75 y.o. 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 She states she was getting out of shower and fell on the carpet.   She is admitted with a  Left Hip Fracture.  Patient seen resting comfortable in bed Husband at bedside Denies shortness of breath and chest pain Able to eat meals without nausea She feels her hip surgery went well    Objective:  Vital signs in last 24 hours:  Temp:  [97.2 F (36.2 C)-98 F (36.7 C)] 97.5 F (36.4 C) (02/18 0806) Pulse Rate:  [67-82] 80 (02/18 0806) Resp:  [10-19] 17 (02/18 0806) BP: (109-157)/(55-94) 109/74 (02/18 0806) SpO2:  [91 %-100 %] 94 % (02/18 0806) Weight:  [80.4 kg] 80.4 kg (02/18 0500)  Weight change: -9.6 kg Filed Weights   12/31/20 1751 01/02/21 0500  Weight: 90 kg 80.4 kg    Intake/Output:    Intake/Output Summary (Last 24 hours) at 01/02/2021 1045 Last data filed at 01/02/2021 1016 Gross per 24 hour  Intake 7370 ml  Output 0 ml  Net 7370 ml     Physical Exam: General: NAD, laying in bed  HEENT Normocephalic, moist oral mucosa  Pulm/lungs Clear  CVS/Heart regular  Abdomen:  Soft, non-tender  Extremities: No peripheral edema  Neurologic: Alert, oriented, able to answer questions  Skin: Left hip bandage  Access: PD catheter       Basic Metabolic Panel:  Recent Labs  Lab 12/31/20 1839 01/01/21 0643  NA 133* 135  K 3.0* 3.3*  CL 96* 98  CO2 26 27  GLUCOSE 226* 217*  BUN 42* 49*  CREATININE 5.96* 6.14*  CALCIUM 8.6* 8.6*     CBC: Recent Labs  Lab 12/31/20 1839 01/01/21 0643  WBC 9.2 8.3  NEUTROABS 6.8  --   HGB 13.1 11.9*  HCT 40.3 36.9  MCV 95.0 95.6  PLT 244 218     No results found for: HEPBSAG, HEPBSAB, HEPBIGM    Microbiology:  Recent Results (from the past 240 hour(s))  Resp Panel by RT-PCR (Flu A&B, Covid) Nasopharyngeal  Swab     Status: None   Collection Time: 12/31/20  6:39 PM   Specimen: Nasopharyngeal Swab; Nasopharyngeal(NP) swabs in vial transport medium  Result Value Ref Range Status   SARS Coronavirus 2 by RT PCR NEGATIVE NEGATIVE Final    Comment: (NOTE) SARS-CoV-2 target nucleic acids are NOT DETECTED.  The SARS-CoV-2 RNA is generally detectable in upper respiratory specimens during the acute phase of infection. The lowest concentration of SARS-CoV-2 viral copies this assay can detect is 138 copies/mL. A negative result does not preclude SARS-Cov-2 infection and should not be used as the sole basis for treatment or other patient management decisions. A negative result may occur with  improper specimen collection/handling, submission of specimen other than nasopharyngeal swab, presence of viral mutation(s) within the areas targeted by this assay, and inadequate number of viral copies(<138 copies/mL). A negative result must be combined with clinical observations, patient history, and epidemiological information. The expected result is Negative.  Fact Sheet for Patients:  EntrepreneurPulse.com.au  Fact Sheet for Healthcare Providers:  IncredibleEmployment.be  This test is no t yet approved or cleared by the [place] FDA and  has been authorized for detection and/or diagnosis of SARS-CoV-2 by FDA under an Emergency Use Authorization (EUA). This EUA will remain  in effect (meaning  this test can be used) for the duration of the COVID-19 declaration under Section 564(b)(1) of the Act, 21 U.S.C.section 360bbb-3(b)(1), unless the authorization is terminated  or revoked sooner.       Influenza A by PCR NEGATIVE NEGATIVE Final   Influenza B by PCR NEGATIVE NEGATIVE Final    Comment: (NOTE) The Xpert Xpress SARS-CoV-2/FLU/RSV plus assay is intended as an aid in the diagnosis of influenza from Nasopharyngeal swab specimens and should not be used as a sole  basis for treatment. Nasal washings and aspirates are unacceptable for Xpert Xpress SARS-CoV-2/FLU/RSV testing.  Fact Sheet for Patients: EntrepreneurPulse.com.au  Fact Sheet for Healthcare Providers: IncredibleEmployment.be  This test is not yet approved or cleared by the [place] FDA and has been authorized for detection and/or diagnosis of SARS-CoV-2 by FDA under an Emergency Use Authorization (EUA). This EUA will remain in effect (meaning this test can be used) for the duration of the COVID-19 declaration under Section 564(b)(1) of the Act, 21 U.S.C. section 360bbb-3(b)(1), unless the authorization is terminated or revoked.  Performed at [facility], [address] [place], [place] [address]     Coagulation Studies: No results for input(s): LABPROT, INR in the last 72 hours.  Urinalysis: No results for input(s): COLORURINE, LABSPEC, PHURINE, GLUCOSEU, HGBUR, BILIRUBINUR, KETONESUR, PROTEINUR, UROBILINOGEN, NITRITE, LEUKOCYTESUR in the last 72 hours.  Invalid input(s): APPERANCEUR    Imaging: DG Chest Portable 1 View  Result Date: 12/31/2020 CLINICAL DATA:  Short of breath, left hip fracture EXAM: PORTABLE CHEST 1 VIEW COMPARISON:  01/08/2019 FINDINGS: Single frontal view of the chest demonstrates an unremarkable cardiac silhouette. No airspace disease, effusion, or pneumothorax. No acute bony abnormalities. IMPRESSION: 1. No acute intrathoracic process. Electronically Signed   By: Randa Ngo M.D.   On: 12/31/2020 18:37   DG HIP OPERATIVE UNILAT W OR W/O PELVIS LEFT  Result Date: 01/01/2021 CLINICAL DATA:  Left hip surgery. EXAM: OPERATIVE LEFT HIP (WITH PELVIS IF PERFORMED) 3 VIEWS TECHNIQUE: Fluoroscopic spot image(s) were submitted for interpretation post-operatively. COMPARISON:  12/31/2020. FINDINGS: ORIF left hip. Hardware intact. Anatomic alignment. Left knee replacement. Three images obtained. Peripheral  vascular calcification. 0 minutes 30 seconds fluoroscopy time. Three images obtained. IMPRESSION: ORIF left hip.  Anatomic alignment. Electronically Signed   By: Marcello Moores  Register   On: 01/01/2021 16:33   DG Hip Unilat With Pelvis 2-3 Views Left  Result Date: 12/31/2020 CLINICAL DATA:  [place], left leg deformity EXAM: DG HIP (WITH OR WITHOUT PELVIS) 2-3V LEFT COMPARISON:  None. FINDINGS: Frontal view of the pelvis as well as frontal and cross-table lateral views of the left hip are obtained. There is a comminuted intertrochanteric left hip fracture with impaction and mild varus angulation. No dislocation. No other acute fractures within the remainder of the pelvis or right hip. Peritoneal dialysis catheter coiled within the right lower quadrant. Postsurgical changes at the lumbosacral junction. IMPRESSION: 1. Comminuted intertrochanteric left hip fracture with mild impaction and varus angulation. Electronically Signed   By: Randa Ngo M.D.   On: 12/31/2020 18:36     Medications:   . sodium chloride Stopped (01/01/21 1614)  . dialysis solution 1.5% low-MG/low-CA     . (feeding supplement) PROSource Plus  30 mL Oral Daily  . atorvastatin  20 mg Oral Daily  . carvedilol  25 mg Oral BID  . docusate sodium  100 mg Oral BID  . feeding supplement (NEPRO CARB STEADY)  237 mL Oral Q24H  . furosemide  80 mg Oral  Daily  . gentamicin cream  1 application Topical Daily  . heparin injection (subcutaneous)  5,000 Units Subcutaneous Q8H  . insulin aspart  0-6 Units Subcutaneous Q4H  . insulin detemir  10 Units Subcutaneous BID AC & HS  . metoCLOPramide  5 mg Oral 5 X Daily  . multivitamin  1 tablet Oral Daily  . pantoprazole  40 mg Oral BID   ALPRAZolam, hydrOXYzine, metoCLOPramide **OR** metoCLOPramide (REGLAN) injection, ondansetron **OR** ondansetron (ZOFRAN) IV, oxyCODONE, polyethylene glycol, traZODone  Assessment/ Plan:  75 y.o. female with PMHX of ESRD on peritoneal dialysis, HTN, CAD, PAD,  COPD, and Diabetes Type 2 on insulin was admitted on 12/31/2020 for Hip fracture ([place]) [S72.009A]  Active Problems:   Diabetes mellitus type 2 with complications, uncontrolled ([place])   Essential hypertension   Coronary atherosclerosis   Single kidney   COPD (chronic obstructive pulmonary disease) with chronic bronchitis (HCC)   CKD (chronic kidney disease), stage III ([place])   Morbid obesity ([place])   Atherosclerosis of native arteries of extremity with intermittent claudication (HCC)   PAD (peripheral artery disease) ([place])   History of total knee replacement, left   History of total knee replacement, right   Hip fracture ([place])  Hip fracture ([place]) [S72.009A] Fall, initial encounter [W19.XXXA] Closed 2-part intertrochanteric fracture of left femur, initial encounter ([place]) [S72.142A]  #. ESRD on peritoneal dialysis -successful treatment last night -peritoneal dialysis ordered tonight -Encouraged participation with PT/OT for home therapy -If rehab is needed, she will need temporary HD during rehab.  #. Anemia of CKD  Lab Results  Component Value Date   HGB 11.9 (L) 01/01/2021   -within acceptable range at this time -will continue to monitor  #. Secondary hyperparathyroidism   No results found for: PTH No results found for: PHOS Monitor calcium and phos level during this admission   #. Diabetes type 2 with CKD Hemoglobin A1C (no units)  Date Value  07/09/2019 8.5  -Elevated glucose levels during this admission -SSI and Levemir ordered to manage  # Left Hip Fracture -Surgical repair performed 01/01/21 -PT/OT ordered -appreciate surgical involvement     LOS: [place] 2/18/202210:45 [place], [place]

## 2021-01-03 DIAGNOSIS — E876 Hypokalemia: Secondary | ICD-10-CM

## 2021-01-03 LAB — GLUCOSE, CAPILLARY
Glucose-Capillary: 155 mg/dL — ABNORMAL HIGH (ref 70–99)
Glucose-Capillary: 179 mg/dL — ABNORMAL HIGH (ref 70–99)
Glucose-Capillary: 205 mg/dL — ABNORMAL HIGH (ref 70–99)
Glucose-Capillary: 232 mg/dL — ABNORMAL HIGH (ref 70–99)
Glucose-Capillary: 240 mg/dL — ABNORMAL HIGH (ref 70–99)
Glucose-Capillary: 289 mg/dL — ABNORMAL HIGH (ref 70–99)
Glucose-Capillary: 81 mg/dL (ref 70–99)

## 2021-01-03 LAB — MAGNESIUM: Magnesium: 1.7 mg/dL (ref 1.7–2.4)

## 2021-01-03 MED ORDER — POTASSIUM CHLORIDE CRYS ER 20 MEQ PO TBCR
40.0000 meq | EXTENDED_RELEASE_TABLET | Freq: Once | ORAL | Status: AC
  Administered 2021-01-03: 40 meq via ORAL
  Filled 2021-01-03: qty 2

## 2021-01-03 NOTE — Progress Notes (Signed)
Physical Therapy Treatment Patient Details Name: Andrea Stewart MRN: [id number] DOB: [birth year] Today's Date: 01/03/2021    History of Present Illness Per MD notes: Pt is a 75 y.o. female with history of ESRD on peritoneal dialysis, poorly controlled type 2 diabetes on insulin, hypertension, CAD, PAD, COPD, who presents after a fall.  Pt diagnosed with L hip fracture and is s/p left femoral IM nail.    PT Comments    Pt was pleasant and motivated to participate during the session but continued to require physical assistance with all functional tasks and to be very limited by pain with gait.  Pt was able to amb 2 x 1 foot this session but with extremely heavy lean on the RW and shuffling gait pattern.  Pt unable to pick her R foot off of the floor despite multiple attempts and cues for sequencing for max UE support on the RW.  Pt put forth very good effort and was highly motivated to get to the chair but was unable to do so safely.  Pt will benefit from PT services in a SNF setting upon discharge to safely address deficits listed in patient problem list for decreased caregiver assistance and eventual return to PLOF.     Follow Up Recommendations  SNF;Supervision for mobility/OOB     Equipment Recommendations  None recommended by PT    Recommendations for Other Services       Precautions / Restrictions Precautions Precautions: Fall Restrictions Weight Bearing Restrictions: Yes LLE Weight Bearing: Weight bearing as tolerated Other Position/Activity Restrictions: Peritoneal dialysis port RLQ    Mobility  Bed Mobility Overal bed mobility: Needs Assistance Bed Mobility: Rolling;Sidelying to Sit;Sit to Sidelying Rolling: Min assist Sidelying to sit: Min assist     Sit to sidelying: Min assist;Mod assist General bed mobility comments: Min to mod A with log rolling; rolling to the right with pillow between the knees for comfort    Transfers Overall transfer level: Needs  assistance Equipment used: Rolling walker (2 wheeled) Transfers: Sit to/from Stand Sit to Stand: Min guard;From elevated surface         General transfer comment: Pt able to stand without physical assistance for the first time this session with min verbal cues for general sequencing  Ambulation/Gait Ambulation/Gait assistance: Min assist Gait Distance (Feet): 1 Feet x 2 Assistive device: Rolling walker (2 wheeled) Gait Pattern/deviations: Step-to pattern;Trunk flexed;Decreased stance time - left;Antalgic Gait velocity: decreased   General Gait Details: Pt able to take several very small, shuffling steps at the EOB with heavy lean on the RW for support and with max distance covered of 1 foot with min A to guide the RW   Stairs             Wheelchair Mobility    Modified Rankin (Stroke Patients Only)       Balance Overall balance assessment: Needs assistance   Sitting balance-Leahy Scale: Normal     Standing balance support: Bilateral upper extremity supported;During functional activity Standing balance-Leahy Scale: Fair Standing balance comment: Heavy lean on the RW for support                            Cognition Arousal/Alertness: Awake/alert Behavior During Therapy: WFL for tasks assessed/performed Overall Cognitive Status: Within Functional Limits for tasks assessed  Exercises Total Joint Exercises Quad Sets: Strengthening Heel Slides: AAROM;Strengthening;Both;5 reps;AROM Long Arc Quad: AROM;Strengthening;Both;10 reps;15 reps Knee Flexion: 10 reps;Both;Strengthening;AROM;15 reps Marching in Standing: AROM;Left;5 reps;Standing    General Comments        Pertinent Vitals/Pain Pain Assessment: 0-10 Pain Score: 3  Pain Location: L hip Pain Descriptors / Indicators: Aching;Sore Pain Intervention(s): Premedicated before session;Monitored during session    Home Living                       Prior Function            PT Goals (current goals can now be found in the care plan section) Progress towards PT goals: Progressing toward goals    Frequency    BID      PT Plan Current plan remains appropriate    Co-evaluation              AM-PAC PT "6 Clicks" Mobility   Outcome Measure  Help needed turning from your back to your side while in a flat bed without using bedrails?: A Little Help needed moving from lying on your back to sitting on the side of a flat bed without using bedrails?: A Lot Help needed moving to and from a bed to a chair (including a wheelchair)?: A Lot Help needed standing up from a chair using your arms (e.g., wheelchair or bedside chair)?: A Little Help needed to walk in hospital room?: Total Help needed climbing 3-5 steps with a railing? : Total 6 Click Score: 12    End of Session Equipment Utilized During Treatment: Gait belt Activity Tolerance: Patient limited by pain Patient left: in bed;with call bell/phone within reach;with bed alarm set;with SCD's reapplied;with family/visitor present Nurse Communication: Mobility status;Weight bearing status PT Visit Diagnosis: Other abnormalities of gait and mobility (R26.89);Muscle weakness (generalized) (M62.81);Pain Pain - Right/Left: Left Pain - part of body: Hip     Time: [id number] PT Time Calculation (min) (ACUTE ONLY): 26 min  Charges:  $Gait Training: 8-22 mins $Therapeutic Exercise: 8-22 mins                     D. Scott Andrea Stewart PT, DPT 01/03/21, 4:27 PM

## 2021-01-03 NOTE — Plan of Care (Signed)

## 2021-01-03 NOTE — Progress Notes (Signed)
1        [place] at [place] NAME: Andrea Stewart    MR#:  [id number]  DATE OF BIRTH:  [birth year]  SUBJECTIVE:  CHIEF COMPLAINT:   Chief Complaint  Patient presents with  . Fall    Tripped on rub, no loc, no thinners, left hip pain   Note sure about going to rehab but does realize she is weak.  Agreeable to participate more with therapy in the hope to go home REVIEW OF SYSTEMS:  Review of Systems  Constitutional: Positive for malaise/fatigue. Negative for diaphoresis, fever and weight loss.  HENT: Negative for ear discharge, ear pain, hearing loss, nosebleeds, sore throat and tinnitus.   Eyes: Negative for blurred vision and pain.  Respiratory: Negative for cough, hemoptysis, shortness of breath and wheezing.   Cardiovascular: Negative for chest pain, palpitations, orthopnea and leg swelling.  Gastrointestinal: Negative for abdominal pain, blood in stool, constipation, diarrhea, heartburn, nausea and vomiting.  Genitourinary: Negative for dysuria, frequency and urgency.  Musculoskeletal: Positive for joint pain. Negative for back pain and myalgias.  Skin: Negative for itching and rash.  Neurological: Negative for dizziness, tingling, tremors, focal weakness, seizures, weakness and headaches.  Psychiatric/Behavioral: Negative for depression. The patient is not nervous/anxious.    DRUG ALLERGIES:   Allergies  Allergen Reactions  . Morphine Anaphylaxis  . Prednisone Other (See Comments)    "makes me think I am having a heart attack"  . Gabapentin Other (See Comments)    intolerance  . Hydralazine Itching  . Nsaids Diarrhea  . Penicillins Swelling    TOLERATED CEFAZOLIN 07/2020 Has patient had a PCN reaction causing immediate rash, facial/tongue/throat swelling, SOB or lightheadedness with hypotension: Yes Has patient had a PCN reaction causing severe rash involving mucus membranes or skin necrosis: No Has patient had a PCN reaction that required  hospitalization: No Has patient had a PCN reaction occurring within the last 10 years: No If all of the above answers are "NO", then may proceed with Cephalosporin use.    VITALS:  Blood pressure (!) 105/58, pulse 74, temperature 98.3 F (36.8 C), resp. rate 18, height '5\' 7"'$  (1.702 m), weight 82 kg, SpO2 94 %. PHYSICAL EXAMINATION:  Physical Exam HENT:     Head: Normocephalic and atraumatic.  Eyes:     Conjunctiva/sclera: Conjunctivae normal.     Pupils: Pupils are equal, round, and reactive to light.  Neck:     Thyroid: No thyromegaly.     Trachea: No tracheal deviation.  Cardiovascular:     Rate and Rhythm: Normal rate and regular rhythm.     Heart sounds: Normal heart sounds.  Pulmonary:     Effort: Pulmonary effort is normal. No respiratory distress.     Breath sounds: Normal breath sounds. No wheezing.  Chest:     Chest wall: No tenderness.  Abdominal:     General: Bowel sounds are normal. There is no distension.     Palpations: Abdomen is soft.     Tenderness: There is no abdominal tenderness.  Musculoskeletal:        General: Normal range of motion.     Cervical back: Normal range of motion and neck supple.     Comments: Surgical site/sutures in place without any signs of erythema or infection  Skin:    General: Skin is warm and dry.     Findings: No rash.  Neurological:     Mental Status: She is alert and oriented to person,  place, and time.     Cranial Nerves: No cranial nerve deficit.    LABORATORY PANEL:  Female CBC Recent Labs  Lab 01/01/21 0643  WBC 8.3  HGB 11.9*  HCT 36.9  PLT 218   ------------------------------------------------------------------------------------------------------------------ Chemistries  Recent Labs  Lab 12/31/20 1839 01/01/21 0643  NA 133* 135  K 3.0* 3.3*  CL 96* 98  CO2 26 27  GLUCOSE 226* 217*  BUN 42* 49*  CREATININE 5.96* 6.14*  CALCIUM 8.6* 8.6*  AST 24  --   ALT 16  --   ALKPHOS 41  --   BILITOT 0.5  --     RADIOLOGY:  No results found. ASSESSMENT AND PLAN:  75 year old female with a known history of ESRD on peritoneal dialysis, diabetes, hypertension, coronary artery disease, PAD, COPD is admitted for left hip fracture status post fall  Left hip fracture Status post intramedullary nail on 2/17.  Postop day 2 Pain management and DVT prophylaxis per Ortho Continue PT and OT -patient would prefer to go home instead of rehab which will help maintain her peritoneal dialysis also if not she will have to be converted to hemodialysis for SNF per nephrology  ESRD-PD per nephrology  Chronic diastolic CHF Continue Lasix 80 mg once daily  Hypokalemia: Replete and recheck  DM2-continue insulin Levemir 15 units subcu twice daily and sliding scale  NovoLog 4 units 3 times daily with meals per diabetic nurse recommendation  Anxiety-continue Xanax nightly, hydroxyzine as needed  HLD-continue Lipitor  Essential hypertension-continue carvedilol  GERD-continue PPI  Gastroparesis-continue Reglan  Insomnia-continue trazodone  Obesity Body mass index is 28.31 kg/m.  Net IO Since Admission: 8,090 mL [01/03/21 1232]      Status is: Inpatient  Remains inpatient appropriate because:Ongoing active pain requiring inpatient pain management   Dispo: The patient is from: Home              Anticipated d/c is to: SNF versus home with home health (patient preference)              Anticipated d/c date is: 2 days              Patient currently is not medically stable to d/c.  Depending on her progress with PT, OT disposition will be decided   Difficult to place patient No   DVT prophylaxis:       SCDs Start: 01/01/21 1734 heparin injection 5,000 Units Start: 12/31/20 2245 SCDs Start: 12/31/20 2146     Family Communication: Updated husband at bedside on 2/18   All the records are reviewed and case discussed with Care Management/Social Worker. Management plans discussed with the  patient, nursing and they are in agreement.  CODE STATUS: Full Code Level of care: Med-Surg  TOTAL TIME TAKING CARE OF THIS PATIENT: 35 minutes.   More than 50% of the time was spent in counseling/coordination of care: YES  POSSIBLE D/C IN 2-3 DAYS, DEPENDING ON CLINICAL CONDITION.   Max Sane M.D on 01/03/2021 at 12:32 PM  [facility]   CC: Primary care physician; Rusty Aus, MD  Note: This dictation was prepared with Dragon dictation along with smaller phrase technology. Any transcriptional errors that result from this process are unintentional.

## 2021-01-03 NOTE — Progress Notes (Signed)
Physical Therapy Treatment Patient Details Name: Andrea Stewart MRN: [id number] DOB: 06/04/1946 Today's Date: 01/03/2021    History of Present Illness Per MD notes: Pt is a 75 y.o. female with history of ESRD on peritoneal dialysis, poorly controlled type 2 diabetes on insulin, hypertension, CAD, PAD, COPD, who presents after a fall.  Pt diagnosed with L hip fracture and is s/p left femoral IM nail.    PT Comments    Pt was pleasant and motivated to participate during the session but despite putting forth very good effort during the session pt remained very limited with functional mobility.  Pt continued to require physical assistance with all functional tasks but was able to take several very small shuffling steps this session.  Pt remained unable to clear the floor with her R foot during amb attempts and max distance covered during shuffling steps was <1 foot on each occasion.  Pt reported no adverse symptoms during the session other than L hip pain with SpO2 and HR WNL.  Pt will benefit from PT services in a SNF setting upon discharge to safely address deficits listed in patient problem list for decreased caregiver assistance and eventual return to PLOF.      Follow Up Recommendations  SNF;Supervision for mobility/OOB     Equipment Recommendations  None recommended by PT    Recommendations for Other Services       Precautions / Restrictions Precautions Precautions: Fall Restrictions Weight Bearing Restrictions: Yes LLE Weight Bearing: Weight bearing as tolerated Other Position/Activity Restrictions: Peritoneal dialysis port RLQ    Mobility  Bed Mobility Overal bed mobility: Needs Assistance Bed Mobility: Rolling;Sidelying to Sit;Sit to Sidelying Rolling: Min assist Sidelying to sit: Min assist     Sit to sidelying: Mod assist General bed mobility comments: Min to mod A with log rolling; rolling to the right with pillow between the knees for comfort     Transfers Overall transfer level: Needs assistance Equipment used: Rolling walker (2 wheeled) Transfers: Sit to/from Stand Sit to Stand: Min assist;From elevated surface         General transfer comment: Mod verbal and tactile cues for sequencing most notably for hand and foot placement  Ambulation/Gait Ambulation/Gait assistance: Min assist Gait Distance (Feet): 1 Feet x 2 Assistive device: Rolling walker (2 wheeled) Gait Pattern/deviations: Step-to pattern;Trunk flexed;Decreased stance time - left;Antalgic Gait velocity: decreased   General Gait Details: Pt able to take several very small, shuffling steps at the EOB with heavy lean on the RW for support and with max distance covered of <1 foot on each occasion with min A to guide the RW.  Pt education provided on PWB sequencing in attempt to increase pt's activity tolerance and distance covered with gait.   Stairs             Wheelchair Mobility    Modified Rankin (Stroke Patients Only)       Balance Overall balance assessment: Needs assistance   Sitting balance-Leahy Scale: Normal     Standing balance support: Bilateral upper extremity supported;During functional activity Standing balance-Leahy Scale: Fair Standing balance comment: Heavy lean on the RW for support                            Cognition Arousal/Alertness: Awake/alert Behavior During Therapy: WFL for tasks assessed/performed Overall Cognitive Status: Within Functional Limits for tasks assessed  Exercises Total Joint Exercises Ankle Circles/Pumps: AROM;Strengthening;Both;10 reps;15 reps (with manual resistance) Quad Sets: Strengthening;Both;10 reps;15 reps Gluteal Sets: Strengthening;Both;10 reps;15 reps Towel Squeeze: Strengthening;Both;5 reps Heel Slides: AAROM;Strengthening;Both;5 reps;10 reps;AROM (AAROM on the LLE) Hip ABduction/ADduction: Strengthening;Both;AAROM;10  reps;5 reps;AROM (AAROM on the LLE) Long Arc Quad: AROM;Strengthening;Both;10 reps;15 reps Knee Flexion: 10 reps;Both;Strengthening;AROM;15 reps Marching in Standing: AROM;Left;5 reps;Standing Bridges: Strengthening;Both;5 reps (gentle, low amplitude) Other Exercises Other Exercises: HEP review for BLE APs, QS, and GS x 10 each every 1-2 hours daily    General Comments        Pertinent Vitals/Pain Pain Assessment: 0-10 Pain Score: 5  Pain Location: L hip Pain Descriptors / Indicators: Aching;Sore Pain Intervention(s): Premedicated before session;Monitored during session;Patient requesting pain meds-RN notified    Home Living                      Prior Function            PT Goals (current goals can now be found in the care plan section) Progress towards PT goals: Progressing toward goals    Frequency    BID      PT Plan Current plan remains appropriate    Co-evaluation              AM-PAC PT "6 Clicks" Mobility   Outcome Measure  Help needed turning from your back to your side while in a flat bed without using bedrails?: A Little Help needed moving from lying on your back to sitting on the side of a flat bed without using bedrails?: A Lot Help needed moving to and from a bed to a chair (including a wheelchair)?: A Lot Help needed standing up from a chair using your arms (e.g., wheelchair or bedside chair)?: A Little Help needed to walk in hospital room?: Total Help needed climbing 3-5 steps with a railing? : Total 6 Click Score: 12    End of Session Equipment Utilized During Treatment: Gait belt Activity Tolerance: Patient limited by pain Patient left: in bed;with call bell/phone within reach;with bed alarm set;with SCD's reapplied Nurse Communication: Mobility status;Weight bearing status PT Visit Diagnosis: Other abnormalities of gait and mobility (R26.89);Muscle weakness (generalized) (M62.81);Pain Pain - Right/Left: Left Pain - part of  body: Hip     Time: [id number] PT Time Calculation (min) (ACUTE ONLY): 41 min  Charges:  $Gait Training: 8-22 mins $Therapeutic Exercise: 23-37 mins                     D. Scott Burleigh Brockmann PT, DPT 01/03/21, 10:35 AM

## 2021-01-03 NOTE — Progress Notes (Signed)
[place], [name] 01/03/21  Subjective:   Andrea Stewart is a 75 y.o. 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 She states she was getting out of shower and fell on the carpet.   She is admitted with a  Left Hip Fracture.  Patient seen while working with physical therapy Alert, oriented and able to answer questions Currently denies chest pain and shortness of breath Endorses left hip soreness, but is working with PT Able to eat meals without nausea States she had a successful PD treatment last night   Objective:  Vital signs in last 24 hours:  Temp:  [98 F (36.7 C)-98.6 F (37 C)] 98.3 F (36.8 C) (02/19 1138) Pulse Rate:  [66-78] 74 (02/19 1138) Resp:  [16-20] 18 (02/19 1138) BP: (102-133)/(57-68) 105/58 (02/19 1138) SpO2:  [88 %-96 %] 94 % (02/19 1138) Weight:  [82 kg] 82 kg (02/19 0500)  Weight change: 1.6 kg Filed Weights   12/31/20 1751 01/02/21 0500 01/03/21 0500  Weight: 90 kg 80.4 kg 82 kg    Intake/Output:    Intake/Output Summary (Last 24 hours) at 01/03/2021 1240 Last data filed at 01/02/2021 1839 Gross per 24 hour  Intake 720 ml  Output -  Net 720 ml     Physical Exam: General: NAD, laying in bed  HEENT Normocephalic, moist oral mucosa  Pulm/lungs Clear  CVS/Heart regular  Abdomen:  Soft, non-tender  Extremities: No peripheral edema  Neurologic: Alert, oriented, able to answer questions  Skin: Left hip bandage  Access: PD catheter       Basic Metabolic Panel:  Recent Labs  Lab 12/31/20 1839 01/01/21 0643  NA 133* 135  K 3.0* 3.3*  CL 96* 98  CO2 26 27  GLUCOSE 226* 217*  BUN 42* 49*  CREATININE 5.96* 6.14*  CALCIUM 8.6* 8.6*     CBC: Recent Labs  Lab 12/31/20 1839 01/01/21 0643  WBC 9.2 8.3  NEUTROABS 6.8  --   HGB 13.1 11.9*  HCT 40.3 36.9  MCV 95.0 95.6  PLT 244 218     No results found for: HEPBSAG, HEPBSAB,  HEPBIGM    Microbiology:  Recent Results (from the past 240 hour(s))  Resp Panel by RT-PCR (Flu A&B, Covid) Nasopharyngeal Swab     Status: None   Collection Time: 12/31/20  6:39 PM   Specimen: Nasopharyngeal Swab; Nasopharyngeal(NP) swabs in vial transport medium  Result Value Ref Range Status   SARS Coronavirus 2 by RT PCR NEGATIVE NEGATIVE Final    Comment: (NOTE) SARS-CoV-2 target nucleic acids are NOT DETECTED.  The SARS-CoV-2 RNA is generally detectable in upper respiratory specimens during the acute phase of infection. The lowest concentration of SARS-CoV-2 viral copies this assay can detect is 138 copies/mL. A negative result does not preclude SARS-Cov-2 infection and should not be used as the sole basis for treatment or other patient management decisions. A negative result may occur with  improper specimen collection/handling, submission of specimen other than nasopharyngeal swab, presence of viral mutation(s) within the areas targeted by this assay, and inadequate number of viral copies(<138 copies/mL). A negative result must be combined with clinical observations, patient history, and epidemiological information. The expected result is Negative.  Fact Sheet for Patients:  EntrepreneurPulse.com.au  Fact Sheet for Healthcare Providers:  IncredibleEmployment.be  This test is no t yet approved or cleared by the [place] FDA and  has been authorized for detection and/or  diagnosis of SARS-CoV-2 by FDA under an Emergency Use Authorization (EUA). This EUA will remain  in effect (meaning this test can be used) for the duration of the COVID-19 declaration under Section 564(b)(1) of the Act, 21 U.S.C.section 360bbb-3(b)(1), unless the authorization is terminated  or revoked sooner.       Influenza A by PCR NEGATIVE NEGATIVE Final   Influenza B by PCR NEGATIVE NEGATIVE Final    Comment: (NOTE) The Xpert Xpress SARS-CoV-2/FLU/RSV  plus assay is intended as an aid in the diagnosis of influenza from Nasopharyngeal swab specimens and should not be used as a sole basis for treatment. Nasal washings and aspirates are unacceptable for Xpert Xpress SARS-CoV-2/FLU/RSV testing.  Fact Sheet for Patients: EntrepreneurPulse.com.au  Fact Sheet for Healthcare Providers: IncredibleEmployment.be  This test is not yet approved or cleared by the [place] FDA and has been authorized for detection and/or diagnosis of SARS-CoV-2 by FDA under an Emergency Use Authorization (EUA). This EUA will remain in effect (meaning this test can be used) for the duration of the COVID-19 declaration under Section 564(b)(1) of the Act, 21 U.S.C. section 360bbb-3(b)(1), unless the authorization is terminated or revoked.  Performed at [facility], [address] [place], [place] [address]     Coagulation Studies: No results for input(s): LABPROT, INR in the last 72 hours.  Urinalysis: No results for input(s): COLORURINE, LABSPEC, PHURINE, GLUCOSEU, HGBUR, BILIRUBINUR, KETONESUR, PROTEINUR, UROBILINOGEN, NITRITE, LEUKOCYTESUR in the last 72 hours.  Invalid input(s): APPERANCEUR    Imaging: DG HIP OPERATIVE UNILAT W OR W/O PELVIS LEFT  Result Date: 01/01/2021 CLINICAL DATA:  Left hip surgery. EXAM: OPERATIVE LEFT HIP (WITH PELVIS IF PERFORMED) 3 VIEWS TECHNIQUE: Fluoroscopic spot image(s) were submitted for interpretation post-operatively. COMPARISON:  12/31/2020. FINDINGS: ORIF left hip. Hardware intact. Anatomic alignment. Left knee replacement. Three images obtained. Peripheral vascular calcification. 0 minutes 30 seconds fluoroscopy time. Three images obtained. IMPRESSION: ORIF left hip.  Anatomic alignment. Electronically Signed   By: Marcello Moores  Register   On: 01/01/2021 16:33     Medications:   . sodium chloride Stopped (01/01/21 1614)  . dialysis solution 1.5% low-MG/low-CA     .  (feeding supplement) PROSource Plus  30 mL Oral Daily  . atorvastatin  20 mg Oral Daily  . carvedilol  25 mg Oral BID  . docusate sodium  100 mg Oral BID  . feeding supplement (NEPRO CARB STEADY)  237 mL Oral Q24H  . furosemide  80 mg Oral Daily  . gentamicin cream  1 application Topical Daily  . heparin injection (subcutaneous)  5,000 Units Subcutaneous Q8H  . insulin aspart  0-5 Units Subcutaneous QHS  . insulin aspart  0-9 Units Subcutaneous TID WC  . insulin aspart  4 Units Subcutaneous TID WC  . insulin detemir  15 Units Subcutaneous BID AC & HS  . metoCLOPramide  5 mg Oral 5 X Daily  . multivitamin  1 tablet Oral Daily  . nicotine  21 mg Transdermal Daily  . pantoprazole  40 mg Oral BID  . potassium chloride  40 mEq Oral Once   ALPRAZolam, hydrOXYzine, metoCLOPramide **OR** metoCLOPramide (REGLAN) injection, ondansetron **OR** ondansetron (ZOFRAN) IV, oxyCODONE, polyethylene glycol, traZODone  Assessment/ Plan:  75 y.o. 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 was admitted on 12/31/2020 for Hip fracture ([place]) [S72.009A]  Active Problems:   Diabetes mellitus type 2 with complications, uncontrolled ([place])   Essential hypertension   Coronary  atherosclerosis   Single kidney   COPD (chronic obstructive pulmonary disease) with chronic bronchitis (HCC)   CKD (chronic kidney disease), stage III (HCC)   Morbid obesity ([place])   Atherosclerosis of native arteries of extremity with intermittent claudication (HCC)   PAD (peripheral artery disease) ([place])   History of total knee replacement, left   History of total knee replacement, right   Hip fracture ([place])  Hip fracture ([place]) [S72.009A] Fall, initial encounter [W19.XXXA] Closed 2-part intertrochanteric fracture of left femur, initial encounter ([place]) [S72.142A]     #. ESRD on peritoneal dialysis -peritoneal dialysis ordered tonight -[facility] 8 hours, 4 exchanges, 1600  fills -Continued to encouraged participation with PT/OT for home therapy -If rehab is needed, she will need temporary HD during rehab.  #. Anemia of Chronic kidney disease  Lab Results  Component Value Date   HGB 11.9 (L) 01/01/2021   -will continue to monitor  #. Secondary hyperparathyroidism   No results found for: PTH No results found for: PHOS Monitor calcium and phos level during this admission   #. Diabetes type 2 with CKD Hemoglobin A1C (no units)  Date Value  07/09/2019 8.5   Hgb A1c MFr Bld (%)  Date Value  01/02/2021 10.0 (H)  -Glucose levels elevated  -SSI and Levemir ordered for management  # Left Hip Fracture -Left Intramedullary femoral nail placement on 01/01/21 -PT/OT ordered -appreciate surgical involvement     LOS: [place], NP 2/19/202212:40 [place], [place]

## 2021-01-03 NOTE — Progress Notes (Signed)
Subjective:  POD #2 s/p intramedullary fixation for left intertrochanteric hip fracture.   Patient reports left hip pain as mild.  A female family member is at the bedside.  Patient sitting in bed brushing her teeth.  Objective:   VITALS:   Vitals:   01/03/21 0435 01/03/21 0500 01/03/21 0744 01/03/21 1138  BP: 133/68  119/68 (!) 105/58  Pulse: 68  78 74  Resp: '20  19 18  '$ Temp: 98 F (36.7 C)  98.6 F (37 C) 98.3 F (36.8 C)  TempSrc:      SpO2: 93%  96% 94%  Weight:  82 kg    Height:        PHYSICAL EXAM: Left lower extremity Neurovascular intact Sensation intact distally Intact pulses distally Dorsiflexion/Plantar flexion intact Incision: scant drainage No cellulitis present Compartment soft  LABS  Results for orders placed or performed during the hospital encounter of 12/31/20 (from the past 24 hour(s))  Hemoglobin A1c     Status: Abnormal   Collection Time: 01/02/21  1:53 PM  Result Value Ref Range   Hgb A1c MFr Bld 10.0 (H) 4.8 - 5.6 %   Mean Plasma Glucose 240.3 mg/dL  Glucose, capillary     Status: Abnormal   Collection Time: 01/02/21  4:55 PM  Result Value Ref Range   Glucose-Capillary 274 (H) 70 - 99 mg/dL  Glucose, capillary     Status: Abnormal   Collection Time: 01/02/21  8:23 PM  Result Value Ref Range   Glucose-Capillary 195 (H) 70 - 99 mg/dL   Comment 1 Notify RN   Glucose, capillary     Status: Abnormal   Collection Time: 01/02/21 10:54 PM  Result Value Ref Range   Glucose-Capillary 273 (H) 70 - 99 mg/dL  Glucose, capillary     Status: Abnormal   Collection Time: 01/03/21 12:11 AM  Result Value Ref Range   Glucose-Capillary 289 (H) 70 - 99 mg/dL   Comment 1 Notify RN   Glucose, capillary     Status: Abnormal   Collection Time: 01/03/21  5:05 AM  Result Value Ref Range   Glucose-Capillary 205 (H) 70 - 99 mg/dL   Comment 1 Notify RN   Glucose, capillary     Status: Abnormal   Collection Time: 01/03/21  7:45 AM  Result Value Ref Range    Glucose-Capillary 179 (H) 70 - 99 mg/dL  Glucose, capillary     Status: Abnormal   Collection Time: 01/03/21 11:56 AM  Result Value Ref Range   Glucose-Capillary 232 (H) 70 - 99 mg/dL    DG HIP OPERATIVE UNILAT W OR W/O PELVIS LEFT  Result Date: 01/01/2021 CLINICAL DATA:  Left hip surgery. EXAM: OPERATIVE LEFT HIP (WITH PELVIS IF PERFORMED) 3 VIEWS TECHNIQUE: Fluoroscopic spot image(s) were submitted for interpretation post-operatively. COMPARISON:  12/31/2020. FINDINGS: ORIF left hip. Hardware intact. Anatomic alignment. Left knee replacement. Three images obtained. Peripheral vascular calcification. 0 minutes 30 seconds fluoroscopy time. Three images obtained. IMPRESSION: ORIF left hip.  Anatomic alignment. Electronically Signed   By: Marcello Moores  Register   On: 01/01/2021 16:33    Assessment/Plan: 2 Days Post-Op   Active Problems:   Diabetes mellitus type 2 with complications, uncontrolled ([place])   Essential hypertension   Coronary atherosclerosis   Single kidney   COPD (chronic obstructive pulmonary disease) with chronic bronchitis (HCC)   CKD (chronic kidney disease), stage III ([place])   Morbid obesity ([place])   Atherosclerosis of native arteries of extremity with intermittent claudication ([place])  PAD (peripheral artery disease) (HCC)   History of total knee replacement, left   History of total knee replacement, right   Hip fracture ([place])   Patient did well postop.  Continue current pain management.  Continue physical therapy.  Patient on heparin for DVT prophylaxis.   [place] , MD 01/03/2021, 12:30 PM

## 2021-01-04 LAB — GLUCOSE, CAPILLARY
Glucose-Capillary: 258 mg/dL — ABNORMAL HIGH (ref 70–99)
Glucose-Capillary: 174 mg/dL — ABNORMAL HIGH (ref 70–99)
Glucose-Capillary: 140 mg/dL — ABNORMAL HIGH (ref 70–99)
Glucose-Capillary: 74 mg/dL (ref 70–99)
Glucose-Capillary: 261 mg/dL — ABNORMAL HIGH (ref 70–99)
Glucose-Capillary: 404 mg/dL — ABNORMAL HIGH (ref 70–99)

## 2021-01-04 LAB — HEPATITIS B SURFACE ANTIGEN: Hepatitis B Surface Ag: NONREACTIVE

## 2021-01-04 LAB — HEPATITIS B CORE ANTIBODY, TOTAL: Hep B Core Total Ab: NONREACTIVE

## 2021-01-04 MED ORDER — CHLORHEXIDINE GLUCONATE CLOTH 2 % EX PADS
6.0000 | MEDICATED_PAD | Freq: Every day | CUTANEOUS | Status: DC
  Administered 2021-01-05 – 2021-01-09 (×5): 6 via TOPICAL

## 2021-01-04 NOTE — Consult Note (Signed)
[facility] Vascular Consult Note  MRN : [id number]  Andrea Stewart is a 75 y.o. (01-17-1946) female who presents with chief complaint of  Chief Complaint  Patient presents with  . Fall    Tripped on rub, no loc, no thinners, left hip pain   .  History of Present Illness: Patient with ESRD on PD admitted after falling, denies LOC- sustained Left hip fracture. Now POD # 3 s/p IM nail. Request for Permacath for transition to SNF upon discharge. Husband at bedside. Patient lethargic but expressed understanding.   Current Facility-Administered Medications  Medication Dose Route Frequency Provider Last Rate Last Admin  . (feeding supplement) PROSource Plus liquid 30 mL  30 mL Oral Daily Lovell Sheehan, MD   30 mL at 01/03/21 2226  . 0.9 %  sodium chloride infusion   Intravenous Continuous Lovell Sheehan, MD   Stopped at 01/01/21 1614  . ALPRAZolam Duanne Moron) tablet 0.5 mg  0.5 mg Oral QHS PRN Lovell Sheehan, MD   0.5 mg at 01/04/21 1027  . atorvastatin (LIPITOR) tablet 20 mg  20 mg Oral Daily Lovell Sheehan, MD   20 mg at 01/04/21 1019  . carvedilol (COREG) tablet 25 mg  25 mg Oral BID Lovell Sheehan, MD   25 mg at 01/04/21 1020  . [START ON 01/05/2021] Chlorhexidine Gluconate Cloth 2 % PADS 6 each  6 each Topical Q0600 Breeze, Benancio Deeds, NP      . dialysis solution 1.5% low-MG/low-CA dianeal solution   Intraperitoneal Q24H Kolluru, Sarath, MD      . docusate sodium (COLACE) capsule 100 mg  100 mg Oral BID Lovell Sheehan, MD   100 mg at 01/03/21 2208  . feeding supplement (NEPRO CARB STEADY) liquid 237 mL  237 mL Oral Q24H Lovell Sheehan, MD   237 mL at 01/03/21 0954  . furosemide (LASIX) tablet 80 mg  80 mg Oral Daily Lovell Sheehan, MD   80 mg at 01/04/21 1019  . gentamicin cream (GARAMYCIN) 0.1 % 1 application  1 application Topical Daily Kolluru, Sarath, MD   1 application at [id number]  . heparin injection 5,000 Units  5,000 Units Subcutaneous Q8H Lovell Sheehan, MD   5,000 Units at 01/04/21 1540  . hydrOXYzine (ATARAX/VISTARIL) tablet 25 mg  25 mg Oral BID PRN Lovell Sheehan, MD      . insulin aspart (novoLOG) injection 0-5 Units  0-5 Units Subcutaneous QHS Max Sane, MD   3 Units at 01/02/21 2311  . insulin aspart (novoLOG) injection 0-9 Units  0-9 Units Subcutaneous TID WC Max Sane, MD   1 Units at 01/04/21 1233  . insulin aspart (novoLOG) injection 4 Units  4 Units Subcutaneous TID WC Max Sane, MD   4 Units at 01/04/21 1233  . insulin detemir (LEVEMIR) injection 15 Units  15 Units Subcutaneous BID AC & HS Max Sane, MD   15 Units at 01/04/21 0840  . metoCLOPramide (REGLAN) tablet 5-10 mg  5-10 mg Oral Q8H PRN Lovell Sheehan, MD       Or  . metoCLOPramide (REGLAN) injection 5-10 mg  5-10 mg Intravenous Q8H PRN Lovell Sheehan, MD      . metoCLOPramide (REGLAN) tablet 5 mg  5 mg Oral 5 X Daily Lovell Sheehan, MD   5 mg at 01/04/21 1540  . multivitamin (RENA-VIT) tablet 1 tablet  1 tablet Oral Daily Lovell Sheehan, MD   1  tablet at 01/04/21 1020  . nicotine (NICODERM CQ - dosed in mg/24 hours) patch 21 mg  21 mg Transdermal Daily Max Sane, MD   21 mg at 01/04/21 1028  . ondansetron (ZOFRAN) tablet 4 mg  4 mg Oral Q6H PRN Lovell Sheehan, MD       Or  . ondansetron [facility]) injection 4 mg  4 mg Intravenous Q6H PRN Lovell Sheehan, MD      . oxyCODONE (Oxy IR/ROXICODONE) immediate release tablet 5 mg  5 mg Oral Q4H PRN Lovell Sheehan, MD   5 mg at 01/04/21 1540  . pantoprazole (PROTONIX) EC tablet 40 mg  40 mg Oral BID Lovell Sheehan, MD   40 mg at 01/04/21 1019  . polyethylene glycol (MIRALAX / GLYCOLAX) packet 17 g  17 g Oral Daily PRN Lovell Sheehan, MD   17 g at 01/04/21 0853  . traZODone (DESYREL) tablet 25-50 mg  25-50 mg Oral QHS PRN Lovell Sheehan, MD   50 mg at 01/03/21 2208    Past Medical History:  Diagnosis Date  . Backache, unspecified    s/p c-spine and l-spine fusions  . COPD (chronic obstructive pulmonary  disease) ([place])   . Degeneration of intervertebral disc, site unspecified   . Depressive disorder, not elsewhere classified   . Disorder of bone and cartilage, unspecified   . Family history of adverse reaction to anesthesia    mother did, but patient not sure of the complication  . HTN (hypertension)   . Hx of left heart catheterization by cutdown    a. 7 years ago; no significant cad; b. Lexiscan 2014: no significant ischemia, no significant EKG changes concerning for ischemia, EF 67%, overall low risk study  . Kidney failure    Dr Cyndia Diver  . Myalgia and myositis, unspecified   . Obesity, unspecified   . Personal history of tobacco use, presenting hazards to health    1/2 ppd  . Plantar fascial fibromatosis   . S/P cholecystectomy   . S/p nephrectomy   . Type II or unspecified type diabetes mellitus without mention of complication, not stated as uncontrolled   . Unspecified essential hypertension   . Unspecified hereditary and idiopathic peripheral neuropathy    secondary to diabetes    Past Surgical History:  Procedure Laterality Date  . ABDOMINAL HYSTERECTOMY    . APPENDECTOMY    . BACK SURGERY     1966 and 2006   . CARDIAC CATHETERIZATION     o.k. 2003  . CARPAL TUNNEL RELEASE    . CERVICAL DISCECTOMY    . CHOLECYSTECTOMY    . COLONOSCOPY  09/1999   colonoscopy-hemorrhoids, re-check 5 years (10/2006)  . dexa     osteopenia (01/2004)  . ELBOW SURGERY    . ESOPHAGOGASTRODUODENOSCOPY (EGD) WITH PROPOFOL N/A 07/23/2020   Procedure: ESOPHAGOGASTRODUODENOSCOPY (EGD) WITH PROPOFOL;  Surgeon: Robert Bellow, MD;  Location: [facility];  Service: Endoscopy;  Laterality: N/A;  TRAVEL TO O.R. - PATIENT TO HAVE SURGERY  . FEMUR IM NAIL Left 01/01/2021   Procedure: INTRAMEDULLARY (IM) NAIL FEMORAL;  Surgeon: Lovell Sheehan, MD;  Location: [facility] OR[facility];  Service: Orthopedics;  Laterality: Left;  . FOOT SURGERY     x 3  . JOINT REPLACEMENT    . KIDNEY DONATION  1991  .  LAPAROSCOPIC ABDOMINAL EXPLORATION N/A 07/23/2020   Procedure: LAPAROSCOPIC ABDOMINAL EXPLORATION;  Surgeon: Robert Bellow, MD;  Location: [facility] ORS;  Service: General;  Laterality:  N/A;  . LUMBAR FUSION    . NECK SURGERY     06/2005  . Problem with general Anesthesia     02/2006  . REPLACEMENT TOTAL KNEE  10/2004  . trigger finger surgery    . UPPER GI ENDOSCOPY N/A 07/23/2020   Procedure: UPPER GI ENDOSCOPY;  Surgeon: Robert Bellow, MD;  Location: [facility] ORS;  Service: General;  Laterality: N/A;  . VENTRAL HERNIA REPAIR N/A 09/08/2017   Primary repair of a 10 x 15 mm infraumbilical fascial defect.  Surgeon: Robert Bellow, MD;  Location: [facility] ORS;  Service: General;  Laterality: N/A;  . VESICOVAGINAL FISTULA CLOSURE W/ TAH    . VIDEO BRONCHOSCOPY Bilateral 04/02/2015   Procedure: VIDEO BRONCHOSCOPY WITHOUT FLUORO;  Surgeon: Juanito Doom, MD;  Location: [facility];  Service: Cardiopulmonary;  Laterality: Bilateral;    Social History Social History   Tobacco Use  . Smoking status: Current Every Day Smoker    Packs/day: 0.25    Years: 30.00    Pack years: 7.50    Types: Cigarettes  . Smokeless tobacco: Never Used  . Tobacco comment: currently smoking .25 ppd, trying to quit 02/13/16  Substance Use Topics  . Alcohol use: No    Alcohol/week: 0.0 standard drinks  . Drug use: No    Family History Family History  Problem Relation Age of Onset  . Hypertension Mother   . Colon cancer Mother   . Cancer Mother        colon  . Coronary artery disease Mother   . Diabetes Mother   . Prostate cancer Father   . COPD Father   . Cancer Father        prostate  . Heart attack Brother 10  . Sleep apnea Brother   . Diabetes Brother   . Diabetes Paternal Aunt   . Diabetes Paternal Uncle   . Cancer Daughter        Brain, lungs, liver, spine, kidney    Allergies  Allergen Reactions  . Morphine Anaphylaxis  . Prednisone Other (See Comments)    "makes me think I am  having a heart attack"  . Gabapentin Other (See Comments)    intolerance  . Hydralazine Itching  . Nsaids Diarrhea  . Penicillins Swelling    TOLERATED CEFAZOLIN 07/2020 Has patient had a PCN reaction causing immediate rash, facial/tongue/throat swelling, SOB or lightheadedness with hypotension: Yes Has patient had a PCN reaction causing severe rash involving mucus membranes or skin necrosis: No Has patient had a PCN reaction that required hospitalization: No Has patient had a PCN reaction occurring within the last 10 years: No If all of the above answers are "NO", then may proceed with Cephalosporin use.      REVIEW OF SYSTEMS (Negative unless checked)  Constitutional: '[]'$ Weight loss  '[]'$ Fever  '[]'$ Chills Cardiac: '[]'$ Chest pain   '[]'$ Chest pressure   '[]'$ Palpitations   '[]'$ Shortness of breath when laying flat   '[]'$ Shortness of breath at rest   '[]'$ Shortness of breath with exertion. Vascular:  '[]'$ Pain in legs with walking   '[]'$ Pain in legs at rest   '[]'$ Pain in legs when laying flat   '[]'$ Claudication   '[]'$ Pain in feet when walking  '[]'$ Pain in feet at rest  '[]'$ Pain in feet when laying flat   '[]'$ History of DVT   '[]'$ Phlebitis   '[]'$ Swelling in legs   '[]'$ Varicose veins   '[]'$ Non-healing ulcers Pulmonary:   '[]'$ Uses home oxygen   '[]'$ Productive cough   '[]'$ Hemoptysis   '[]'$ Wheeze  '[]'$   COPD   '[]'$ Asthma Neurologic:  '[]'$ Dizziness  '[]'$ Blackouts   '[]'$ Seizures   '[]'$ History of stroke   '[]'$ History of TIA  '[]'$ Aphasia   '[]'$ Temporary blindness   '[]'$ Dysphagia   '[]'$ Weakness or numbness in arms   '[]'$ Weakness or numbness in legs Musculoskeletal:  '[]'$ Arthritis   '[]'$ Joint swelling   '[x]'$ Joint pain   '[]'$ Low back pain Hematologic:  '[]'$ Easy bruising  '[]'$ Easy bleeding   '[]'$ Hypercoagulable state   '[]'$ Anemic  '[]'$ Hepatitis Gastrointestinal:  '[]'$ Blood in stool   '[]'$ Vomiting blood  '[]'$ Gastroesophageal reflux/heartburn   '[]'$ Difficulty swallowing. Genitourinary:  '[x]'$ Chronic kidney disease   '[]'$ Difficult urination  '[]'$ Frequent urination  '[]'$ Burning with urination   '[]'$ Blood in urine Skin:   '[]'$ Rashes   '[]'$ Ulcers   '[]'$ Wounds Psychological:  '[]'$ History of anxiety   '[]'$  History of major depression.  Physical Examination  Vitals:   01/04/21 0500 01/04/21 0752 01/04/21 1146 01/04/21 1548  BP:  138/68 126/69 129/62  Pulse:  82 70 67  Resp:  '16 18 15  '$ Temp:  98.2 F (36.8 C) 98.9 F (37.2 C) 98.3 F (36.8 C)  TempSrc:      SpO2:  93% 92% 94%  Weight: 86.4 kg     Height:       Body mass index is 29.83 kg/m. Gen:  WD/WN, NAD Neck: Trachea midline.  No JVD.  Pulmonary:  Good air movement, respirations not labored, equal bilaterally.  Cardiac: RRR, normal S1, S2. Vascular: Palpable radial, extremities warm Gastrointestinal: soft, non-tender/non-distended. No guarding/reflex. PD catheter in place Musculoskeletal:  Extremities without ischemic changes.         CBC Lab Results  Component Value Date   WBC 8.3 01/01/2021   HGB 11.9 (L) 01/01/2021   HCT 36.9 01/01/2021   MCV 95.6 01/01/2021   PLT 218 01/01/2021    BMET    Component Value Date/Time   NA 135 01/01/2021 0643   NA 139 02/20/2020 0000   K 3.3 (L) 01/01/2021 0643   CL 98 01/01/2021 0643   CO2 27 01/01/2021 0643   GLUCOSE 217 (H) 01/01/2021 0643   BUN 49 (H) 01/01/2021 0643   BUN 38 (A) 02/20/2020 0000   CREATININE 6.14 (H) 01/01/2021 0643   CREATININE 1.65 (H) 07/22/2017 1553   CALCIUM 8.6 (L) 01/01/2021 0643   GFRNONAA 7 (L) 01/01/2021 0643   GFRAA 10 (L) 07/18/2020 1118   Estimated Creatinine Clearance: 8.9 mL/min (A) (by C-G formula based on SCr of 6.14 mg/dL (H)).  COAG Lab Results  Component Value Date   INR 1.01 03/27/2015    Radiology DG Chest Portable 1 View  Result Date: 12/31/2020 CLINICAL DATA:  Short of breath, left hip fracture EXAM: PORTABLE CHEST 1 VIEW COMPARISON:  01/08/2019 FINDINGS: Single frontal view of the chest demonstrates an unremarkable cardiac silhouette. No airspace disease, effusion, or pneumothorax. No acute bony abnormalities. IMPRESSION: 1. No acute  intrathoracic process. Electronically Signed   By: Randa Ngo M.D.   On: 12/31/2020 18:37   DG HIP OPERATIVE UNILAT W OR W/O PELVIS LEFT  Result Date: 01/01/2021 CLINICAL DATA:  Left hip surgery. EXAM: OPERATIVE LEFT HIP (WITH PELVIS IF PERFORMED) 3 VIEWS TECHNIQUE: Fluoroscopic spot image(s) were submitted for interpretation post-operatively. COMPARISON:  12/31/2020. FINDINGS: ORIF left hip. Hardware intact. Anatomic alignment. Left knee replacement. Three images obtained. Peripheral vascular calcification. 0 minutes 30 seconds fluoroscopy time. Three images obtained. IMPRESSION: ORIF left hip.  Anatomic alignment. Electronically Signed   By: [place]   On: 01/01/2021 16:33   DG Hip Unilat  With Pelvis 2-3 Views Left  Result Date: 12/31/2020 CLINICAL DATA:  [place], left leg deformity EXAM: DG HIP (WITH OR WITHOUT PELVIS) 2-3V LEFT COMPARISON:  None. FINDINGS: Frontal view of the pelvis as well as frontal and cross-table lateral views of the left hip are obtained. There is a comminuted intertrochanteric left hip fracture with impaction and mild varus angulation. No dislocation. No other acute fractures within the remainder of the pelvis or right hip. Peritoneal dialysis catheter coiled within the right lower quadrant. Postsurgical changes at the lumbosacral junction. IMPRESSION: 1. Comminuted intertrochanteric left hip fracture with mild impaction and varus angulation. Electronically Signed   By: Randa Ngo M.D.   On: 12/31/2020 18:36      Assessment/Plan 1. ESRD on PD now requiring HD for SNF placement 2. Will Plan for Permacath Placement tomorrow per Dr. Lucky Cowboy. NPO after MN   Evaristo Bury, MD  01/04/2021 4:03 PM    This note was created with Dragon medical transcription system.  Any error is purely unintentional

## 2021-01-04 NOTE — Progress Notes (Signed)
Physical Therapy Treatment Patient Details Name: Andrea Stewart MRN: [id number] DOB: 1946/07/01 Today's Date: 01/04/2021    History of Present Illness Per MD notes: Pt is a 75 y.o. female with history of ESRD on peritoneal dialysis, poorly controlled type 2 diabetes on insulin, hypertension, CAD, PAD, COPD, who presents after a fall.  Pt diagnosed with L hip fracture and is s/p left femoral IM nail.    PT Comments    Pt was pleasant but was more fatigued with somewhat flat affect this session.  Pt required increased frequency and duration of therapeutic rest breaks during the session and was unable to take any steps with her RLE. Pt also required increased physical assistance with bed mobility and transfers.  Pt's SpO2 and HR were WNL with nursing notified of pt's increased lethargy this session.  Pt will benefit from PT services in a SNF setting upon discharge to safely address deficits listed in patient problem list for decreased caregiver assistance and eventual return to PLOF.      Follow Up Recommendations  SNF;Supervision for mobility/OOB     Equipment Recommendations  None recommended by PT    Recommendations for Other Services       Precautions / Restrictions Precautions Precautions: Fall Restrictions Weight Bearing Restrictions: Yes LLE Weight Bearing: Weight bearing as tolerated Other Position/Activity Restrictions: Peritoneal dialysis port RLQ    Mobility  Bed Mobility Overal bed mobility: Needs Assistance Bed Mobility: Rolling;Sidelying to Sit Rolling: Min assist Sidelying to sit: Mod assist       General bed mobility comments: Pt more fatigued this session than prior sessions and required mod A to come to sitting at the EOB with cues for sequencing    Transfers Overall transfer level: Needs assistance Equipment used: Rolling walker (2 wheeled) Transfers: Sit to/from Stand Sit to Stand: Min assist;From elevated surface         General transfer  comment: Min A to come to standing from an elevated surface; pt stood x 4 with standing activity to tolerance each time  Ambulation/Gait             General Gait Details: Pt unable to advance her RLE this session but could advance her L foot forwards and backwards.   Stairs             Wheelchair Mobility    Modified Rankin (Stroke Patients Only)       Balance Overall balance assessment: Needs assistance   Sitting balance-Leahy Scale: Normal     Standing balance support: Bilateral upper extremity supported;During functional activity Standing balance-Leahy Scale: Fair Standing balance comment: Heavy lean on the RW for support                            Cognition Arousal/Alertness: Lethargic Behavior During Therapy: WFL for tasks assessed/performed Overall Cognitive Status: Within Functional Limits for tasks assessed                                        Exercises Total Joint Exercises Ankle Circles/Pumps: AROM;Strengthening;Both;10 reps;15 reps Quad Sets: 15 reps;10 reps;Both;Strengthening Gluteal Sets: Strengthening;Both;10 reps;15 reps Towel Squeeze: Strengthening;Both;10 reps Heel Slides: AAROM;Strengthening;Both;5 reps;AROM;10 reps (AAROM on the LLE) Long Arc Quad: AROM;Strengthening;Both;10 reps;15 reps Knee Flexion: 10 reps;Both;Strengthening;AROM;15 reps Marching in Standing: AROM;Left;5 reps;Standing Other Exercises Other Exercises: Pre-gait activities including weight shifting L/R in standing and  advancing the LLE forwards/backwards    General Comments        Pertinent Vitals/Pain Pain Assessment: 0-10 Pain Score: 3  Pain Location: L hip Pain Descriptors / Indicators: Aching;Sore Pain Intervention(s): Premedicated before session;Monitored during session    Home Living                      Prior Function            PT Goals (current goals can now be found in the care plan section) Progress towards  PT goals: Not progressing toward goals - comment (limited by fatigue this session)    Frequency    BID      PT Plan Current plan remains appropriate    Co-evaluation              AM-PAC PT "6 Clicks" Mobility   Outcome Measure  Help needed turning from your back to your side while in a flat bed without using bedrails?: A Little Help needed moving from lying on your back to sitting on the side of a flat bed without using bedrails?: A Lot Help needed moving to and from a bed to a chair (including a wheelchair)?: Total Help needed standing up from a chair using your arms (e.g., wheelchair or bedside chair)?: A Little Help needed to walk in hospital room?: Total Help needed climbing 3-5 steps with a railing? : Total 6 Click Score: 11    End of Session Equipment Utilized During Treatment: Gait belt Activity Tolerance: Patient limited by fatigue Patient left: in bed;with call bell/phone within reach;with bed alarm set;with SCD's reapplied;with family/visitor present Nurse Communication: Mobility status;Weight bearing status;Other (comment) (Pt lethargic with flat affect) PT Visit Diagnosis: Other abnormalities of gait and mobility (R26.89);Muscle weakness (generalized) (M62.81);Pain Pain - Right/Left: Left Pain - part of body: Hip     Time: [id number] PT Time Calculation (min) (ACUTE ONLY): 38 min  Charges:  $Gait Training: 8-22 mins $Therapeutic Exercise: 8-22 mins $Therapeutic Activity: 8-22 mins                     D. Scott Envy Meno PT, DPT 01/04/21, 11:22 AM

## 2021-01-04 NOTE — Progress Notes (Signed)
[place], [name] 01/04/21  Subjective:   Andrea Stewart is a 75 y.o. 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 She states she was getting out of shower and fell on the carpet.   She is admitted with a  Left Hip Fracture.  Patient seen resting in bed with breakfast Alert and oriented She says she is unable to put any weight on left leg She says she may have to go to rehab to continue to work with PT Complains of discomfort with PD last night.  States she has not had a BM since Tuesday Denies shortness of breath and chest pain    Objective:  Vital signs in last 24 hours:  Temp:  [97.6 F (36.4 C)-98.3 F (36.8 C)] 98.2 F (36.8 C) (02/20 0752) Pulse Rate:  [67-89] 82 (02/20 0752) Resp:  [16-18] 16 (02/20 0752) BP: (105-141)/(48-80) 138/68 (02/20 0752) SpO2:  [90 %-96 %] 93 % (02/20 0752) Weight:  [86.4 kg] 86.4 kg (02/20 0500)  Weight change: 4.4 kg Filed Weights   01/02/21 0500 01/03/21 0500 01/04/21 0500  Weight: 80.4 kg 82 kg 86.4 kg    Intake/Output:    Intake/Output Summary (Last 24 hours) at 01/04/2021 1009 Last data filed at 01/04/2021 0953 Gross per 24 hour  Intake 360 ml  Output 150 ml  Net 210 ml     Physical Exam: General: NAD, laying in bed  HEENT Normocephalic, moist oral mucosa  Pulm/lungs Clear  CVS/Heart regular  Abdomen:  Soft, non-tender  Extremities: No peripheral edema  Neurologic: Alert, oriented, able to answer questions  Skin: Left hip bandage  Access: PD catheter       Basic Metabolic Panel:  Recent Labs  Lab 12/31/20 1839 01/01/21 0643 01/03/21 1250  NA 133* 135  --   K 3.0* 3.3*  --   CL 96* 98  --   CO2 26 27  --   GLUCOSE 226* 217*  --   BUN 42* 49*  --   CREATININE 5.96* 6.14*  --   CALCIUM 8.6* 8.6*  --   MG  --   --  1.7     CBC: Recent Labs  Lab 12/31/20 1839 01/01/21 0643  WBC 9.2 8.3  NEUTROABS 6.8  --   HGB  13.1 11.9*  HCT 40.3 36.9  MCV 95.0 95.6  PLT 244 218     No results found for: HEPBSAG, HEPBSAB, HEPBIGM    Microbiology:  Recent Results (from the past 240 hour(s))  Resp Panel by RT-PCR (Flu A&B, Covid) Nasopharyngeal Swab     Status: None   Collection Time: 12/31/20  6:39 PM   Specimen: Nasopharyngeal Swab; Nasopharyngeal(NP) swabs in vial transport medium  Result Value Ref Range Status   SARS Coronavirus 2 by RT PCR NEGATIVE NEGATIVE Final    Comment: (NOTE) SARS-CoV-2 target nucleic acids are NOT DETECTED.  The SARS-CoV-2 RNA is generally detectable in upper respiratory specimens during the acute phase of infection. The lowest concentration of SARS-CoV-2 viral copies this assay can detect is 138 copies/mL. A negative result does not preclude SARS-Cov-2 infection and should not be used as the sole basis for treatment or other patient management decisions. A negative result may occur with  improper specimen collection/handling, submission of specimen other than nasopharyngeal swab, presence of viral mutation(s) within the areas targeted by this assay, and inadequate number of viral copies(<138 copies/mL). A negative result must be  combined with clinical observations, patient history, and epidemiological information. The expected result is Negative.  Fact Sheet for Patients:  EntrepreneurPulse.com.au  Fact Sheet for Healthcare Providers:  IncredibleEmployment.be  This test is no t yet approved or cleared by the [place] FDA and  has been authorized for detection and/or diagnosis of SARS-CoV-2 by FDA under an Emergency Use Authorization (EUA). This EUA will remain  in effect (meaning this test can be used) for the duration of the COVID-19 declaration under Section 564(b)(1) of the Act, 21 U.S.C.section 360bbb-3(b)(1), unless the authorization is terminated  or revoked sooner.       Influenza A by PCR NEGATIVE NEGATIVE Final    Influenza B by PCR NEGATIVE NEGATIVE Final    Comment: (NOTE) The Xpert Xpress SARS-CoV-2/FLU/RSV plus assay is intended as an aid in the diagnosis of influenza from Nasopharyngeal swab specimens and should not be used as a sole basis for treatment. Nasal washings and aspirates are unacceptable for Xpert Xpress SARS-CoV-2/FLU/RSV testing.  Fact Sheet for Patients: EntrepreneurPulse.com.au  Fact Sheet for Healthcare Providers: IncredibleEmployment.be  This test is not yet approved or cleared by the [place] FDA and has been authorized for detection and/or diagnosis of SARS-CoV-2 by FDA under an Emergency Use Authorization (EUA). This EUA will remain in effect (meaning this test can be used) for the duration of the COVID-19 declaration under Section 564(b)(1) of the Act, 21 U.S.C. section 360bbb-3(b)(1), unless the authorization is terminated or revoked.  Performed at [facility], [address] [place], [place] [address]     Coagulation Studies: No results for input(s): LABPROT, INR in the last 72 hours.  Urinalysis: No results for input(s): COLORURINE, LABSPEC, PHURINE, GLUCOSEU, HGBUR, BILIRUBINUR, KETONESUR, PROTEINUR, UROBILINOGEN, NITRITE, LEUKOCYTESUR in the last 72 hours.  Invalid input(s): APPERANCEUR    Imaging: No results found.   Medications:   . sodium chloride Stopped (01/01/21 1614)  . dialysis solution 1.5% low-MG/low-CA     . (feeding supplement) PROSource Plus  30 mL Oral Daily  . atorvastatin  20 mg Oral Daily  . carvedilol  25 mg Oral BID  . docusate sodium  100 mg Oral BID  . feeding supplement (NEPRO CARB STEADY)  237 mL Oral Q24H  . furosemide  80 mg Oral Daily  . gentamicin cream  1 application Topical Daily  . heparin injection (subcutaneous)  5,000 Units Subcutaneous Q8H  . insulin aspart  0-5 Units Subcutaneous QHS  . insulin aspart  0-9 Units Subcutaneous TID WC  . insulin aspart  4  Units Subcutaneous TID WC  . insulin detemir  15 Units Subcutaneous BID AC & HS  . metoCLOPramide  5 mg Oral 5 X Daily  . multivitamin  1 tablet Oral Daily  . nicotine  21 mg Transdermal Daily  . pantoprazole  40 mg Oral BID   ALPRAZolam, hydrOXYzine, metoCLOPramide **OR** metoCLOPramide (REGLAN) injection, ondansetron **OR** ondansetron (ZOFRAN) IV, oxyCODONE, polyethylene glycol, traZODone  Assessment/ Plan:  75 y.o. 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 was admitted on 12/31/2020 for Hip fracture ([place]) [S72.009A]  Active Problems:   Diabetes mellitus type 2 with complications, uncontrolled ([place])   Essential hypertension   Coronary atherosclerosis   Single kidney   COPD (chronic obstructive pulmonary disease) with chronic bronchitis (HCC)   CKD (chronic kidney disease), stage III ([place])   Morbid obesity ([place])   Atherosclerosis of native arteries of extremity with intermittent claudication ([place])  PAD (peripheral artery disease) (HCC)   History of total knee replacement, left   History of total knee replacement, right   Hip fracture ([place])  Hip fracture ([place]) [S72.009A] Fall, initial encounter [W19.XXXA] Closed 2-part intertrochanteric fracture of left femur, initial encounter ([place]) [S72.142A]     #. ESRD on peritoneal dialysis -peritoneal dialysis ordered tonight -[facility] 8 hours, 4 exchanges, 1600 fills -offer Miralax PRN  for constipation -Continued to encouraged participation with PT/OT for home therapy -Spoke with patient about temporary HD while in rehab -Would need a temporary HD cath and out[patient treatment center -Will consult Vascular for HD cath placement -Plan to return to PD once discharged from rehab  #. Anemia of Chronic kidney disease  Lab Results  Component Value Date   HGB 11.9 (L) 01/01/2021   -stable -Will continue to monitor  #. Secondary hyperparathyroidism   No results found for:  PTH No results found for: PHOS Monitor calcium and phos level during this admission   #. Diabetes type 2 with CKD Hemoglobin A1C (no units)  Date Value  07/09/2019 8.5   Hgb A1c MFr Bld (%)  Date Value  01/02/2021 10.0 (H)  -Glucose levels elevated  -SSI and Levemir ordered for management  # Left Hip Fracture -Left Intramedullary femoral nail placement on 01/01/21 -PT/OT ordered -recommending SNF for rehab -appreciate surgical involvement     LOS: [place], NP 2/20/202210:09 [place], [place]

## 2021-01-04 NOTE — Progress Notes (Signed)
  Subjective:  Patient reports pain as mild.  Husband in room.  Objective:   VITALS:   Vitals:   01/04/21 0345 01/04/21 0500 01/04/21 0752 01/04/21 1146  BP: (!) 115/48  138/68 126/69  Pulse: 77  82 70  Resp: '16  16 18  '$ Temp: 97.6 F (36.4 C)  98.2 F (36.8 C) 98.9 F (37.2 C)  TempSrc: Oral     SpO2: 94%  93% 92%  Weight:  86.4 kg    Height:        PHYSICAL EXAM:  Neurovascular intact Dorsiflexion/Plantar flexion intact Incision: dressing C/D/I No cellulitis present Compartment soft  LABS  Results for orders placed or performed during the hospital encounter of 12/31/20 (from the past 24 hour(s))  Magnesium     Status: None   Collection Time: 01/03/21 12:50 PM  Result Value Ref Range   Magnesium 1.7 1.7 - 2.4 mg/dL  Glucose, capillary     Status: None   Collection Time: 01/03/21  4:03 PM  Result Value Ref Range   Glucose-Capillary 81 70 - 99 mg/dL  Glucose, capillary     Status: Abnormal   Collection Time: 01/03/21  8:14 PM  Result Value Ref Range   Glucose-Capillary 155 (H) 70 - 99 mg/dL   Comment 1 Notify RN   Glucose, capillary     Status: Abnormal   Collection Time: 01/03/21 11:51 PM  Result Value Ref Range   Glucose-Capillary 240 (H) 70 - 99 mg/dL   Comment 1 Notify RN   Glucose, capillary     Status: Abnormal   Collection Time: 01/04/21  3:46 AM  Result Value Ref Range   Glucose-Capillary 258 (H) 70 - 99 mg/dL   Comment 1 Notify RN   Glucose, capillary     Status: Abnormal   Collection Time: 01/04/21  7:30 AM  Result Value Ref Range   Glucose-Capillary 174 (H) 70 - 99 mg/dL   Comment 1 Notify RN   Glucose, capillary     Status: Abnormal   Collection Time: 01/04/21 11:44 AM  Result Value Ref Range   Glucose-Capillary 140 (H) 70 - 99 mg/dL    No results found.  Assessment/Plan: 3 Days Post-Op   Active Problems:   Diabetes mellitus type 2 with complications, uncontrolled ([place])   Essential hypertension   Coronary atherosclerosis   Single  kidney   COPD (chronic obstructive pulmonary disease) with chronic bronchitis (HCC)   CKD (chronic kidney disease), stage III (HCC)   Morbid obesity ([place])   Atherosclerosis of native arteries of extremity with intermittent claudication (HCC)   PAD (peripheral artery disease) ([place])   History of total knee replacement, left   History of total knee replacement, right   Hip fracture ([place])   Up with therapy Discharge to SNF   Lovell Sheehan , MD 01/04/2021, 12:03 PM

## 2021-01-04 NOTE — Progress Notes (Signed)
1        [place] [facility] [place] NAME: Andrea Stewart    MR#:  [id number]  DATE OF BIRTH:  September 10, 1946  SUBJECTIVE:  CHIEF COMPLAINT:   Chief Complaint  Patient presents with  . Fall    Tripped on rub, no loc, no thinners, left hip pain   Remains weak.  Husband at bedside.  Patient is agreeable for switching to hemodialysis and go to SNF if need REVIEW OF SYSTEMS:  Review of Systems  Constitutional: Positive for malaise/fatigue. Negative for diaphoresis, fever and weight loss.  HENT: Negative for ear discharge, ear pain, hearing loss, nosebleeds, sore throat and tinnitus.   Eyes: Negative for blurred vision and pain.  Respiratory: Negative for cough, hemoptysis, shortness of breath and wheezing.   Cardiovascular: Negative for chest pain, palpitations, orthopnea and leg swelling.  Gastrointestinal: Negative for abdominal pain, blood in stool, constipation, diarrhea, heartburn, nausea and vomiting.  Genitourinary: Negative for dysuria, frequency and urgency.  Musculoskeletal: Positive for joint pain. Negative for back pain and myalgias.  Skin: Negative for itching and rash.  Neurological: Negative for dizziness, tingling, tremors, focal weakness, seizures, weakness and headaches.  Psychiatric/Behavioral: Negative for depression. The patient is not nervous/anxious.    DRUG ALLERGIES:   Allergies  Allergen Reactions  . Morphine Anaphylaxis  . Prednisone Other (See Comments)    "makes me think I am having a heart attack"  . Gabapentin Other (See Comments)    intolerance  . Hydralazine Itching  . Nsaids Diarrhea  . Penicillins Swelling    TOLERATED CEFAZOLIN 07/2020 Has patient had a PCN reaction causing immediate rash, facial/tongue/throat swelling, SOB or lightheadedness with hypotension: Yes Has patient had a PCN reaction causing severe rash involving mucus membranes or skin necrosis: No Has patient had a PCN reaction that required hospitalization:  No Has patient had a PCN reaction occurring within the last 10 years: No If all of the above answers are "NO", then may proceed with Cephalosporin use.    VITALS:  Blood pressure 126/69, pulse 70, temperature 98.9 F (37.2 C), resp. rate 18, height '5\' 7"'$  (1.702 m), weight 86.4 kg, SpO2 92 %. PHYSICAL EXAMINATION:  Physical Exam HENT:     Head: Normocephalic and atraumatic.  Eyes:     Conjunctiva/sclera: Conjunctivae normal.     Pupils: Pupils are equal, round, and reactive to light.  Neck:     Thyroid: No thyromegaly.     Trachea: No tracheal deviation.  Cardiovascular:     Rate and Rhythm: Normal rate and regular rhythm.     Heart sounds: Normal heart sounds.  Pulmonary:     Effort: Pulmonary effort is normal. No respiratory distress.     Breath sounds: Normal breath sounds. No wheezing.  Chest:     Chest wall: No tenderness.  Abdominal:     General: Bowel sounds are normal. There is no distension.     Palpations: Abdomen is soft.     Tenderness: There is no abdominal tenderness.  Musculoskeletal:        General: Normal range of motion.     Cervical back: Normal range of motion and neck supple.     Comments: Surgical site/sutures in place without any signs of erythema or infection  Skin:    General: Skin is warm and dry.     Findings: No rash.  Neurological:     Mental Status: She is alert and oriented to person, place, and time.  Cranial Nerves: No cranial nerve deficit.    LABORATORY PANEL:  Female CBC Recent Labs  Lab 01/01/21 0643  WBC 8.3  HGB 11.9*  HCT 36.9  PLT 218   ------------------------------------------------------------------------------------------------------------------ Chemistries  Recent Labs  Lab 12/31/20 1839 01/01/21 0643 01/03/21 1250  NA 133* 135  --   K 3.0* 3.3*  --   CL 96* 98  --   CO2 26 27  --   GLUCOSE 226* 217*  --   BUN 42* 49*  --   CREATININE 5.96* 6.14*  --   CALCIUM 8.6* 8.6*  --   MG  --   --  1.7  AST 24   --   --   ALT 16  --   --   ALKPHOS 41  --   --   BILITOT 0.5  --   --    RADIOLOGY:  No results found. ASSESSMENT AND PLAN:  75 year old female with a known history of ESRD on peritoneal dialysis, diabetes, hypertension, coronary artery disease, PAD, COPD is admitted for left hip fracture status post fall  Left hip fracture Status post intramedullary nail on 2/17.  Postop day 3 Pain management and DVT prophylaxis per Ortho PT and OT recommends SNF.  Patient agreeable.  Nephrology is working on switching her to hemodialysis.  Will need new hemodialysis catheter placement by vascular surgery hopefully tomorrow  ESRD-PD per nephrology.  Transitioning to hemodialysis for SNF placement  Chronic diastolic CHF Continue Lasix 80 mg once daily  Hypokalemia: Replete and recheck  DM2-continue insulin Levemir 15 units subcu twice daily and sliding scale  NovoLog 4 units 3 times daily with meals per diabetic nurse recommendation  Anxiety-continue Xanax nightly, hydroxyzine as needed  HLD-continue Lipitor  Essential hypertension-continue carvedilol  GERD-continue PPI  Gastroparesis-continue Reglan  Insomnia-continue trazodone  Obesity Body mass index is 29.83 kg/m.  Net IO Since Admission: 8,300 mL [01/04/21 1407]      Status is: Inpatient  Remains inpatient appropriate because:Ongoing active pain requiring inpatient pain management   Dispo: The patient is from: Home              Anticipated d/c is to: SNF               Anticipated d/c date is: 2 days              Patient currently is not medically stable to d/c.  Still very weak need to switch her dialysis catheter for hemodialysis for placement at SNF   Difficult to place patient No   DVT prophylaxis:       SCDs Start: 01/01/21 1734 heparin injection 5,000 Units Start: 12/31/20 2245 SCDs Start: 12/31/20 2146     Family Communication: Updated husband at bedside on 2/20   All the records are reviewed and  case discussed with Care Management/Social Worker. Management plans discussed with the patient, nursing, family, nephrology and they are in agreement.  CODE STATUS: Full Code Level of care: Med-Surg  TOTAL TIME TAKING CARE OF THIS PATIENT: 35 minutes.   More than 50% of the time was spent in counseling/coordination of care: YES  POSSIBLE D/C IN 2-3 DAYS, DEPENDING ON CLINICAL CONDITION.   Max Sane M.D on 01/04/2021 at 2:07 PM  [facility]   CC: Primary care physician; Rusty Aus, MD  Note: This dictation was prepared with Dragon dictation along with smaller phrase technology. Any transcriptional errors that result from this process are unintentional.

## 2021-01-04 NOTE — H&P (View-Only) (Signed)
[facility] Vascular Consult Note  MRN : [id number]  Andrea Stewart is a 75 y.o. (April 29, 1946) female who presents with chief complaint of  Chief Complaint  Patient presents with  . Fall    Tripped on rub, no loc, no thinners, left hip pain   .  History of Present Illness: Patient with ESRD on PD admitted after falling, denies LOC- sustained Left hip fracture. Now POD # 3 s/p IM nail. Request for Permacath for transition to SNF upon discharge. Husband at bedside. Patient lethargic but expressed understanding.   Current Facility-Administered Medications  Medication Dose Route Frequency Provider Last Rate Last Admin  . (feeding supplement) PROSource Plus liquid 30 mL  30 mL Oral Daily Lovell Sheehan, MD   30 mL at 01/03/21 2226  . 0.9 %  sodium chloride infusion   Intravenous Continuous Lovell Sheehan, MD   Stopped at 01/01/21 1614  . ALPRAZolam Duanne Moron) tablet 0.5 mg  0.5 mg Oral QHS PRN Lovell Sheehan, MD   0.5 mg at 01/04/21 1027  . atorvastatin (LIPITOR) tablet 20 mg  20 mg Oral Daily Lovell Sheehan, MD   20 mg at 01/04/21 1019  . carvedilol (COREG) tablet 25 mg  25 mg Oral BID Lovell Sheehan, MD   25 mg at 01/04/21 1020  . [START ON 01/05/2021] Chlorhexidine Gluconate Cloth 2 % PADS 6 each  6 each Topical Q0600 Breeze, Benancio Deeds, NP      . dialysis solution 1.5% low-MG/low-CA dianeal solution   Intraperitoneal Q24H Kolluru, Sarath, MD      . docusate sodium (COLACE) capsule 100 mg  100 mg Oral BID Lovell Sheehan, MD   100 mg at 01/03/21 2208  . feeding supplement (NEPRO CARB STEADY) liquid 237 mL  237 mL Oral Q24H Lovell Sheehan, MD   237 mL at 01/03/21 0954  . furosemide (LASIX) tablet 80 mg  80 mg Oral Daily Lovell Sheehan, MD   80 mg at 01/04/21 1019  . gentamicin cream (GARAMYCIN) 0.1 % 1 application  1 application Topical Daily Kolluru, Sarath, MD   1 application at [id number]  . heparin injection 5,000 Units  5,000 Units Subcutaneous Q8H Lovell Sheehan, MD   5,000 Units at 01/04/21 1540  . hydrOXYzine (ATARAX/VISTARIL) tablet 25 mg  25 mg Oral BID PRN Lovell Sheehan, MD      . insulin aspart (novoLOG) injection 0-5 Units  0-5 Units Subcutaneous QHS Max Sane, MD   3 Units at 01/02/21 2311  . insulin aspart (novoLOG) injection 0-9 Units  0-9 Units Subcutaneous TID WC Max Sane, MD   1 Units at 01/04/21 1233  . insulin aspart (novoLOG) injection 4 Units  4 Units Subcutaneous TID WC Max Sane, MD   4 Units at 01/04/21 1233  . insulin detemir (LEVEMIR) injection 15 Units  15 Units Subcutaneous BID AC & HS Max Sane, MD   15 Units at 01/04/21 0840  . metoCLOPramide (REGLAN) tablet 5-10 mg  5-10 mg Oral Q8H PRN Lovell Sheehan, MD       Or  . metoCLOPramide (REGLAN) injection 5-10 mg  5-10 mg Intravenous Q8H PRN Lovell Sheehan, MD      . metoCLOPramide (REGLAN) tablet 5 mg  5 mg Oral 5 X Daily Lovell Sheehan, MD   5 mg at 01/04/21 1540  . multivitamin (RENA-VIT) tablet 1 tablet  1 tablet Oral Daily Lovell Sheehan, MD   1  tablet at 01/04/21 1020  . nicotine (NICODERM CQ - dosed in mg/24 hours) patch 21 mg  21 mg Transdermal Daily Max Sane, MD   21 mg at 01/04/21 1028  . ondansetron (ZOFRAN) tablet 4 mg  4 mg Oral Q6H PRN Lovell Sheehan, MD       Or  . ondansetron [facility]) injection 4 mg  4 mg Intravenous Q6H PRN Lovell Sheehan, MD      . oxyCODONE (Oxy IR/ROXICODONE) immediate release tablet 5 mg  5 mg Oral Q4H PRN Lovell Sheehan, MD   5 mg at 01/04/21 1540  . pantoprazole (PROTONIX) EC tablet 40 mg  40 mg Oral BID Lovell Sheehan, MD   40 mg at 01/04/21 1019  . polyethylene glycol (MIRALAX / GLYCOLAX) packet 17 g  17 g Oral Daily PRN Lovell Sheehan, MD   17 g at 01/04/21 0853  . traZODone (DESYREL) tablet 25-50 mg  25-50 mg Oral QHS PRN Lovell Sheehan, MD   50 mg at 01/03/21 2208    Past Medical History:  Diagnosis Date  . Backache, unspecified    s/p c-spine and l-spine fusions  . COPD (chronic obstructive pulmonary  disease) ([place])   . Degeneration of intervertebral disc, site unspecified   . Depressive disorder, not elsewhere classified   . Disorder of bone and cartilage, unspecified   . Family history of adverse reaction to anesthesia    mother did, but patient not sure of the complication  . HTN (hypertension)   . Hx of left heart catheterization by cutdown    a. 7 years ago; no significant cad; b. Lexiscan 2014: no significant ischemia, no significant EKG changes concerning for ischemia, EF 67%, overall low risk study  . Kidney failure    Dr Cyndia Diver  . Myalgia and myositis, unspecified   . Obesity, unspecified   . Personal history of tobacco use, presenting hazards to health    1/2 ppd  . Plantar fascial fibromatosis   . S/P cholecystectomy   . S/p nephrectomy   . Type II or unspecified type diabetes mellitus without mention of complication, not stated as uncontrolled   . Unspecified essential hypertension   . Unspecified hereditary and idiopathic peripheral neuropathy    secondary to diabetes    Past Surgical History:  Procedure Laterality Date  . ABDOMINAL HYSTERECTOMY    . APPENDECTOMY    . BACK SURGERY     1966 and 2006   . CARDIAC CATHETERIZATION     o.k. 2003  . CARPAL TUNNEL RELEASE    . CERVICAL DISCECTOMY    . CHOLECYSTECTOMY    . COLONOSCOPY  09/1999   colonoscopy-hemorrhoids, re-check 5 years (10/2006)  . dexa     osteopenia (01/2004)  . ELBOW SURGERY    . ESOPHAGOGASTRODUODENOSCOPY (EGD) WITH PROPOFOL N/A 07/23/2020   Procedure: ESOPHAGOGASTRODUODENOSCOPY (EGD) WITH PROPOFOL;  Surgeon: Robert Bellow, MD;  Location: [facility];  Service: Endoscopy;  Laterality: N/A;  TRAVEL TO O.R. - PATIENT TO HAVE SURGERY  . FEMUR IM NAIL Left 01/01/2021   Procedure: INTRAMEDULLARY (IM) NAIL FEMORAL;  Surgeon: Lovell Sheehan, MD;  Location: [facility] ORS;  Service: Orthopedics;  Laterality: Left;  . FOOT SURGERY     x 3  . JOINT REPLACEMENT    . KIDNEY DONATION  1991  .  LAPAROSCOPIC ABDOMINAL EXPLORATION N/A 07/23/2020   Procedure: LAPAROSCOPIC ABDOMINAL EXPLORATION;  Surgeon: Robert Bellow, MD;  Location: [facility] ORS;  Service: General;  Laterality:  N/A;  . LUMBAR FUSION    . NECK SURGERY     06/2005  . Problem with general Anesthesia     02/2006  . REPLACEMENT TOTAL KNEE  10/2004  . trigger finger surgery    . UPPER GI ENDOSCOPY N/A 07/23/2020   Procedure: UPPER GI ENDOSCOPY;  Surgeon: Robert Bellow, MD;  Location: [facility] ORS;  Service: General;  Laterality: N/A;  . VENTRAL HERNIA REPAIR N/A 09/08/2017   Primary repair of a 10 x 15 mm infraumbilical fascial defect.  Surgeon: Robert Bellow, MD;  Location: [facility] ORS;  Service: General;  Laterality: N/A;  . VESICOVAGINAL FISTULA CLOSURE W/ TAH    . VIDEO BRONCHOSCOPY Bilateral 04/02/2015   Procedure: VIDEO BRONCHOSCOPY WITHOUT FLUORO;  Surgeon: Juanito Doom, MD;  Location: [facility];  Service: Cardiopulmonary;  Laterality: Bilateral;    Social History Social History   Tobacco Use  . Smoking status: Current Every Day Smoker    Packs/day: 0.25    Years: 30.00    Pack years: 7.50    Types: Cigarettes  . Smokeless tobacco: Never Used  . Tobacco comment: currently smoking .25 ppd, trying to quit 02/13/16  Substance Use Topics  . Alcohol use: No    Alcohol/week: 0.0 standard drinks  . Drug use: No    Family History Family History  Problem Relation Age of Onset  . Hypertension Mother   . Colon cancer Mother   . Cancer Mother        colon  . Coronary artery disease Mother   . Diabetes Mother   . Prostate cancer Father   . COPD Father   . Cancer Father        prostate  . Heart attack Brother 44  . Sleep apnea Brother   . Diabetes Brother   . Diabetes Paternal Aunt   . Diabetes Paternal Uncle   . Cancer Daughter        Brain, lungs, liver, spine, kidney    Allergies  Allergen Reactions  . Morphine Anaphylaxis  . Prednisone Other (See Comments)    "makes me think I am  having a heart attack"  . Gabapentin Other (See Comments)    intolerance  . Hydralazine Itching  . Nsaids Diarrhea  . Penicillins Swelling    TOLERATED CEFAZOLIN 07/2020 Has patient had a PCN reaction causing immediate rash, facial/tongue/throat swelling, SOB or lightheadedness with hypotension: Yes Has patient had a PCN reaction causing severe rash involving mucus membranes or skin necrosis: No Has patient had a PCN reaction that required hospitalization: No Has patient had a PCN reaction occurring within the last 10 years: No If all of the above answers are "NO", then may proceed with Cephalosporin use.      REVIEW OF SYSTEMS (Negative unless checked)  Constitutional: '[]'$ Weight loss  '[]'$ Fever  '[]'$ Chills Cardiac: '[]'$ Chest pain   '[]'$ Chest pressure   '[]'$ Palpitations   '[]'$ Shortness of breath when laying flat   '[]'$ Shortness of breath at rest   '[]'$ Shortness of breath with exertion. Vascular:  '[]'$ Pain in legs with walking   '[]'$ Pain in legs at rest   '[]'$ Pain in legs when laying flat   '[]'$ Claudication   '[]'$ Pain in feet when walking  '[]'$ Pain in feet at rest  '[]'$ Pain in feet when laying flat   '[]'$ History of DVT   '[]'$ Phlebitis   '[]'$ Swelling in legs   '[]'$ Varicose veins   '[]'$ Non-healing ulcers Pulmonary:   '[]'$ Uses home oxygen   '[]'$ Productive cough   '[]'$ Hemoptysis   '[]'$ Wheeze  '[]'$   COPD   '[]'$ Asthma Neurologic:  '[]'$ Dizziness  '[]'$ Blackouts   '[]'$ Seizures   '[]'$ History of stroke   '[]'$ History of TIA  '[]'$ Aphasia   '[]'$ Temporary blindness   '[]'$ Dysphagia   '[]'$ Weakness or numbness in arms   '[]'$ Weakness or numbness in legs Musculoskeletal:  '[]'$ Arthritis   '[]'$ Joint swelling   '[x]'$ Joint pain   '[]'$ Low back pain Hematologic:  '[]'$ Easy bruising  '[]'$ Easy bleeding   '[]'$ Hypercoagulable state   '[]'$ Anemic  '[]'$ Hepatitis Gastrointestinal:  '[]'$ Blood in stool   '[]'$ Vomiting blood  '[]'$ Gastroesophageal reflux/heartburn   '[]'$ Difficulty swallowing. Genitourinary:  '[x]'$ Chronic kidney disease   '[]'$ Difficult urination  '[]'$ Frequent urination  '[]'$ Burning with urination   '[]'$ Blood in urine Skin:   '[]'$ Rashes   '[]'$ Ulcers   '[]'$ Wounds Psychological:  '[]'$ History of anxiety   '[]'$  History of major depression.  Physical Examination  Vitals:   01/04/21 0500 01/04/21 0752 01/04/21 1146 01/04/21 1548  BP:  138/68 126/69 129/62  Pulse:  82 70 67  Resp:  '16 18 15  '$ Temp:  98.2 F (36.8 C) 98.9 F (37.2 C) 98.3 F (36.8 C)  TempSrc:      SpO2:  93% 92% 94%  Weight: 86.4 kg     Height:       Body mass index is 29.83 kg/m. Gen:  WD/WN, NAD Neck: Trachea midline.  No JVD.  Pulmonary:  Good air movement, respirations not labored, equal bilaterally.  Cardiac: RRR, normal S1, S2. Vascular: Palpable radial, extremities warm Gastrointestinal: soft, non-tender/non-distended. No guarding/reflex. PD catheter in place Musculoskeletal:  Extremities without ischemic changes.         CBC Lab Results  Component Value Date   WBC 8.3 01/01/2021   HGB 11.9 (L) 01/01/2021   HCT 36.9 01/01/2021   MCV 95.6 01/01/2021   PLT 218 01/01/2021    BMET    Component Value Date/Time   NA 135 01/01/2021 0643   NA 139 02/20/2020 0000   K 3.3 (L) 01/01/2021 0643   CL 98 01/01/2021 0643   CO2 27 01/01/2021 0643   GLUCOSE 217 (H) 01/01/2021 0643   BUN 49 (H) 01/01/2021 0643   BUN 38 (A) 02/20/2020 0000   CREATININE 6.14 (H) 01/01/2021 0643   CREATININE 1.65 (H) 07/22/2017 1553   CALCIUM 8.6 (L) 01/01/2021 0643   GFRNONAA 7 (L) 01/01/2021 0643   GFRAA 10 (L) 07/18/2020 1118   Estimated Creatinine Clearance: 8.9 mL/min (A) (by C-G formula based on SCr of 6.14 mg/dL (H)).  COAG Lab Results  Component Value Date   INR 1.01 03/27/2015    Radiology DG Chest Portable 1 View  Result Date: 12/31/2020 CLINICAL DATA:  Short of breath, left hip fracture EXAM: PORTABLE CHEST 1 VIEW COMPARISON:  01/08/2019 FINDINGS: Single frontal view of the chest demonstrates an unremarkable cardiac silhouette. No airspace disease, effusion, or pneumothorax. No acute bony abnormalities. IMPRESSION: 1. No acute  intrathoracic process. Electronically Signed   By: Randa Ngo M.D.   On: 12/31/2020 18:37   DG HIP OPERATIVE UNILAT W OR W/O PELVIS LEFT  Result Date: 01/01/2021 CLINICAL DATA:  Left hip surgery. EXAM: OPERATIVE LEFT HIP (WITH PELVIS IF PERFORMED) 3 VIEWS TECHNIQUE: Fluoroscopic spot image(s) were submitted for interpretation post-operatively. COMPARISON:  12/31/2020. FINDINGS: ORIF left hip. Hardware intact. Anatomic alignment. Left knee replacement. Three images obtained. Peripheral vascular calcification. 0 minutes 30 seconds fluoroscopy time. Three images obtained. IMPRESSION: ORIF left hip.  Anatomic alignment. Electronically Signed   By: Yaak   On: 01/01/2021 16:33   DG Hip Unilat  With Pelvis 2-3 Views Left  Result Date: 12/31/2020 CLINICAL DATA:  [place], left leg deformity EXAM: DG HIP (WITH OR WITHOUT PELVIS) 2-3V LEFT COMPARISON:  None. FINDINGS: Frontal view of the pelvis as well as frontal and cross-table lateral views of the left hip are obtained. There is a comminuted intertrochanteric left hip fracture with impaction and mild varus angulation. No dislocation. No other acute fractures within the remainder of the pelvis or right hip. Peritoneal dialysis catheter coiled within the right lower quadrant. Postsurgical changes at the lumbosacral junction. IMPRESSION: 1. Comminuted intertrochanteric left hip fracture with mild impaction and varus angulation. Electronically Signed   By: Randa Ngo M.D.   On: 12/31/2020 18:36      Assessment/Plan 1. ESRD on PD now requiring HD for SNF placement 2. Will Plan for Permacath Placement tomorrow per Dr. Lucky Cowboy. NPO after MN   Evaristo Bury, MD  01/04/2021 4:03 PM    This note was created with Dragon medical transcription system.  Any error is purely unintentional

## 2021-01-05 ENCOUNTER — Inpatient Hospital Stay
Admission: EM | Disposition: A | Discharge status: Skilled Nursing Facility | Payer: Self-pay | Source: Home / Self Care | Attending: Internal Medicine

## 2021-01-05 ENCOUNTER — Encounter: Payer: Self-pay | Admitting: Vascular Surgery

## 2021-01-05 ENCOUNTER — Other Ambulatory Visit (INDEPENDENT_AMBULATORY_CARE_PROVIDER_SITE_OTHER): Payer: Self-pay | Admitting: Vascular Surgery

## 2021-01-05 DIAGNOSIS — N185 Chronic kidney disease, stage 5: Secondary | ICD-10-CM

## 2021-01-05 DIAGNOSIS — E118 Type 2 diabetes mellitus with unspecified complications: Secondary | ICD-10-CM

## 2021-01-05 DIAGNOSIS — E1165 Type 2 diabetes mellitus with hyperglycemia: Secondary | ICD-10-CM

## 2021-01-05 HISTORY — PX: DIALYSIS/PERMA CATHETER INSERTION: CATH118288

## 2021-01-05 LAB — GLUCOSE, CAPILLARY
Glucose-Capillary: 207 mg/dL — ABNORMAL HIGH (ref 70–99)
Glucose-Capillary: 277 mg/dL — ABNORMAL HIGH (ref 70–99)
Glucose-Capillary: 288 mg/dL — ABNORMAL HIGH (ref 70–99)
Glucose-Capillary: 301 mg/dL — ABNORMAL HIGH (ref 70–99)
Glucose-Capillary: 340 mg/dL — ABNORMAL HIGH (ref 70–99)
Glucose-Capillary: 356 mg/dL — ABNORMAL HIGH (ref 70–99)
Glucose-Capillary: 87 mg/dL (ref 70–99)

## 2021-01-05 LAB — RENAL FUNCTION PANEL
Albumin: 1.9 g/dL — ABNORMAL LOW (ref 3.5–5.0)
Anion gap: 7 (ref 5–15)
BUN: 60 mg/dL — ABNORMAL HIGH (ref 8–23)
CO2: 27 mmol/L (ref 22–32)
Calcium: 8.1 mg/dL — ABNORMAL LOW (ref 8.9–10.3)
Chloride: 92 mmol/L — ABNORMAL LOW (ref 98–111)
Creatinine, Ser: 5.16 mg/dL — ABNORMAL HIGH (ref 0.44–1.00)
GFR, Estimated: 8 mL/min — ABNORMAL LOW (ref >60)
Glucose, Bld: 155 mg/dL — ABNORMAL HIGH (ref 70–99)
Phosphorus: 5.2 mg/dL — ABNORMAL HIGH (ref 2.5–4.6)
Potassium: 3 mmol/L — ABNORMAL LOW (ref 3.5–5.1)
Sodium: 126 mmol/L — ABNORMAL LOW (ref 135–145)

## 2021-01-05 LAB — PHOSPHORUS: Phosphorus: 4.8 mg/dL — ABNORMAL HIGH (ref 2.5–4.6)

## 2021-01-05 SURGERY — DIALYSIS/PERMA CATHETER INSERTION
Anesthesia: Moderate Sedation | Laterality: Right

## 2021-01-05 MED ORDER — ASPIRIN EC 81 MG PO TBEC
81.0000 mg | DELAYED_RELEASE_TABLET | Freq: Two times a day (BID) | ORAL | Status: DC
  Administered 2021-01-05 – 2021-01-09 (×7): 81 mg via ORAL
  Filled 2021-01-05 (×8): qty 1

## 2021-01-05 MED ORDER — INSULIN ASPART 100 UNIT/ML ~~LOC~~ SOLN
6.0000 [IU] | Freq: Three times a day (TID) | SUBCUTANEOUS | Status: DC
  Administered 2021-01-05 – 2021-01-07 (×4): 6 [IU] via SUBCUTANEOUS
  Filled 2021-01-05 (×4): qty 1

## 2021-01-05 MED ORDER — FENTANYL CITRATE (PF) 100 MCG/2ML IJ SOLN: 50.0000 ug | Freq: Once | INTRAMUSCULAR | Status: DC

## 2021-01-05 MED ORDER — POLYETHYLENE GLYCOL 3350 17 G PO PACK
17.0000 g | PACK | Freq: Every day | ORAL | Status: DC
  Administered 2021-01-05 – 2021-01-06 (×2): 17 g via ORAL
  Filled 2021-01-05 (×5): qty 1

## 2021-01-05 MED ORDER — FENTANYL CITRATE (PF) 100 MCG/2ML IJ SOLN
25.0000 ug | Freq: Once | INTRAMUSCULAR | Status: AC
  Administered 2021-01-05: 25 ug via INTRAVENOUS
  Filled 2021-01-05: qty 2

## 2021-01-05 MED ORDER — OXYCODONE HCL 5 MG PO TABS
5.0000 mg | ORAL_TABLET | Freq: Four times a day (QID) | ORAL | Status: DC | PRN
  Administered 2021-01-08 – 2021-01-09 (×5): 5 mg via ORAL
  Filled 2021-01-05 (×4): qty 1

## 2021-01-05 MED ORDER — ONDANSETRON HCL 4 MG/2ML IJ SOLN: 4.0000 mg | Freq: Four times a day (QID) | INTRAMUSCULAR | Status: DC | PRN

## 2021-01-05 MED ORDER — MIDAZOLAM HCL 2 MG/2ML IJ SOLN
INTRAMUSCULAR | Status: AC
  Administered 2021-01-05: 2 mg
  Filled 2021-01-05: qty 2

## 2021-01-05 MED ORDER — ALPRAZOLAM 0.5 MG PO TABS
0.5000 mg | ORAL_TABLET | Freq: Three times a day (TID) | ORAL | Status: DC | PRN
  Administered 2021-01-05 – 2021-01-09 (×9): 0.5 mg via ORAL
  Filled 2021-01-05 (×9): qty 1

## 2021-01-05 MED ORDER — ACETAMINOPHEN 325 MG PO TABS
650.0000 mg | ORAL_TABLET | Freq: Four times a day (QID) | ORAL | Status: DC | PRN
  Administered 2021-01-06: 650 mg via ORAL

## 2021-01-05 MED ORDER — SODIUM CHLORIDE 0.9 % IV SOLN: INTRAVENOUS | Status: DC

## 2021-01-05 MED ORDER — INSULIN DETEMIR 100 UNIT/ML ~~LOC~~ SOLN
18.0000 [IU] | Freq: Two times a day (BID) | SUBCUTANEOUS | Status: DC
  Administered 2021-01-06 – 2021-01-07 (×4): 18 [IU] via SUBCUTANEOUS
  Filled 2021-01-05 (×6): qty 0.18

## 2021-01-05 MED ORDER — FAMOTIDINE 20 MG PO TABS: 40.0000 mg | ORAL_TABLET | Freq: Once | ORAL | Status: DC | PRN

## 2021-01-05 MED ORDER — CLINDAMYCIN PHOSPHATE 300 MG/50ML IV SOLN
INTRAVENOUS | Status: AC
  Administered 2021-01-05: 300 mg
  Filled 2021-01-05: qty 50

## 2021-01-05 MED ORDER — HYDROCODONE-ACETAMINOPHEN 5-325 MG PO TABS
1.0000 | ORAL_TABLET | ORAL | Status: DC | PRN
Start: 2021-01-05 — End: 2021-01-09
  Administered 2021-01-05 – 2021-01-09 (×13): 1 via ORAL
  Filled 2021-01-05 (×14): qty 1

## 2021-01-05 MED ORDER — MIDAZOLAM HCL 2 MG/ML PO SYRP: 8.0000 mg | ORAL_SOLUTION | Freq: Once | ORAL | Status: DC | PRN

## 2021-01-05 MED ORDER — DIPHENHYDRAMINE HCL 50 MG/ML IJ SOLN: 50.0000 mg | Freq: Once | INTRAMUSCULAR | Status: DC | PRN

## 2021-01-05 MED ORDER — SENNOSIDES-DOCUSATE SODIUM 8.6-50 MG PO TABS
2.0000 | ORAL_TABLET | Freq: Two times a day (BID) | ORAL | Status: DC
  Administered 2021-01-05 – 2021-01-09 (×9): 2 via ORAL
  Filled 2021-01-05 (×9): qty 2

## 2021-01-05 MED ORDER — FENTANYL CITRATE (PF) 100 MCG/2ML IJ SOLN
INTRAMUSCULAR | Status: AC
  Administered 2021-01-05: 50 ug
  Filled 2021-01-05: qty 2

## 2021-01-05 MED ORDER — CLINDAMYCIN PHOSPHATE 300 MG/50ML IV SOLN: 300.0000 mg | Freq: Once | INTRAVENOUS | Status: DC

## 2021-01-05 SURGICAL SUPPLY — 9 items
ADH SKN CLS APL DERMABOND .7 (GAUZE/BANDAGES/DRESSINGS) ×1
BIOPATCH RED 1 DISK 7.0 (GAUZE/BANDAGES/DRESSINGS) ×1 IMPLANT
CATH CANNON HEMO 15FR 19 (HEMODIALYSIS SUPPLIES) ×1 IMPLANT
DERMABOND ADVANCED (GAUZE/BANDAGES/DRESSINGS) ×1
DERMABOND ADVANCED .7 DNX12 (GAUZE/BANDAGES/DRESSINGS) IMPLANT
PACK ANGIOGRAPHY (CUSTOM PROCEDURE TRAY) ×1 IMPLANT
SHIELD X-DRAPE GOLD 12X17 (MISCELLANEOUS) ×1 IMPLANT
SUT MNCRL AB 4-0 PS2 18 (SUTURE) ×1 IMPLANT
SUT PROLENE 0 CT 1 30 (SUTURE) ×1 IMPLANT

## 2021-01-05 NOTE — Care Management Important Message (Signed)
Important Message  Patient Details  Name: Andrea Stewart MRN: [id number] Date of Birth: 07-08-1946   Medicare Important Message Given:  Yes     Juliann Pulse A Briea Mcenery 01/05/2021, 12:07 PM

## 2021-01-05 NOTE — Progress Notes (Signed)
PT Cancellation Note  Patient Details Name: REGEINA MENDEN MRN: [id number] DOB: 02/12/1946   Cancelled Treatment:    Reason Eval/Treat Not Completed: Patient requested no PT this session secondary to fatigue after procedure for placement of HD permcath.  Will attempt to see pt at a future date/time as medically appropriate.     Linus Salmons PT, DPT 01/05/21, 4:52 PM

## 2021-01-05 NOTE — Progress Notes (Signed)
Patient accepted at [facility] TTS 5:20am. Confirming with [place] whether [employer] is good with the time. Patient can start on Thursday 2/24

## 2021-01-05 NOTE — Op Note (Signed)
OPERATIVE NOTE    PRE-OPERATIVE DIAGNOSIS: 1. ESRD 2.  Recent hip fracture requiring hemodialysis instead of peritoneal dialysis in rehab  POST-OPERATIVE DIAGNOSIS: same as above  PROCEDURE: 1. Ultrasound guidance for vascular access to the right internal jugular vein 2. Fluoroscopic guidance for placement of catheter 3. Placement of a 19 cm tip to cuff tunneled hemodialysis catheter via the right internal jugular vein  SURGEON: Leotis Pain, MD  ANESTHESIA:  Local with Moderate conscious sedation for approximately 16 minutes using 2 mg of Versed and 50 mcg of Fentanyl  ESTIMATED BLOOD LOSS: 5 cc  FLUORO TIME: less than one minute  CONTRAST: none  FINDING(S): 1.  Patent right internal jugular vein  SPECIMEN(S):  None  INDICATIONS:   Andrea Stewart is a 75 y.o.female who presents with a recent hip fracture and need for inpatient rehabilitation and skilled nursing facility but does not do peritoneal dialysis.  She has been on renal failure and been on peritoneal dialysis previously.  The patient needs long term dialysis access for their ESRD, and a Permcath is necessary.  Risks and benefits are discussed and informed consent is obtained.    DESCRIPTION: After obtaining full informed written consent, the patient was brought back to the vascular suited. The patient's right neck and chest were sterilely prepped and draped in a sterile surgical field was created. Moderate conscious sedation was administered during a face to face encounter with the patient throughout the procedure with my supervision of the RN administering medicines and monitoring the patient's vital signs, pulse oximetry, telemetry and mental status throughout from the start of the procedure until the patient was taken to the recovery room.  The right internal jugular vein was visualized with ultrasound and found to be patent. It was then accessed under direct ultrasound guidance and a permanent image was recorded. A wire  was placed. After skin nick and dilatation, the peel-away sheath was placed over the wire. I then turned my attention to an area under the clavicle. Approximately 1-2 fingerbreadths below the clavicle a small counterincision was created and tunneled from the subclavicular incision to the access site. Using fluoroscopic guidance, a 19 centimeter tip to cuff tunneled hemodialysis catheter was selected, and tunneled from the subclavicular incision to the access site. It was then placed through the peel-away sheath and the peel-away sheath was removed. Using fluoroscopic guidance the catheter tips were parked in the right atrium. The appropriate distal connectors were placed. It withdrew blood well and flushed easily with heparinized saline and a concentrated heparin solution was then placed. It was secured to the chest wall with 2 Prolene sutures. The access incision was closed single 4-0 Monocryl. A 4-0 Monocryl pursestring suture was placed around the exit site. Sterile dressings were placed. The patient tolerated the procedure well and was taken to the recovery room in stable condition.  COMPLICATIONS: None  CONDITION: Stable  Leotis Pain, MD 01/05/2021 11:12 AM   This note was created with Dragon Medical transcription system. Any errors in dictation are purely unintentional.

## 2021-01-05 NOTE — Progress Notes (Signed)
[place], [name] 01/05/21  Subjective:   Andrea Stewart is a 75 y.o. 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 She states she was getting out of shower and fell on the carpet.   She is admitted with a  Left Hip Fracture.  Patient seen sitting at side of bed  Currently NPO awaiting procedure Denies shortness of breath and chest pain Says she had a good PD treatment last night    Objective:  Vital signs in last 24 hours:  Temp:  [97.9 F (36.6 C)-98.9 F (37.2 C)] 98.9 F (37.2 C) (02/21 1029) Pulse Rate:  [67-80] 68 (02/21 1200) Resp:  [14-21] 16 (02/21 1200) BP: (124-146)/(53-83) 143/70 (02/21 1200) SpO2:  [90 %-99 %] 91 % (02/21 1200) Weight:  [86.8 kg] 86.8 kg (02/21 0500)  Weight change: 0.4 kg Filed Weights   01/03/21 0500 01/04/21 0500 01/05/21 0500  Weight: 82 kg 86.4 kg 86.8 kg    Intake/Output:    Intake/Output Summary (Last 24 hours) at 01/05/2021 1240 Last data filed at 01/05/2021 0533 Gross per 24 hour  Intake 0 ml  Output 200 ml  Net -200 ml     Physical Exam: General: NAD, laying in bed  HEENT Normocephalic, moist oral mucosa  Pulm/lungs Clear  CVS/Heart regular  Abdomen:  Soft, non-tender  Extremities: No peripheral edema  Neurologic: Alert, oriented, able to answer questions  Skin: Left hip bandage  Access: PD catheter       Basic Metabolic Panel:  Recent Labs  Lab 12/31/20 1839 01/01/21 0643 01/03/21 1250  NA 133* 135  --   K 3.0* 3.3*  --   CL 96* 98  --   CO2 26 27  --   GLUCOSE 226* 217*  --   BUN 42* 49*  --   CREATININE 5.96* 6.14*  --   CALCIUM 8.6* 8.6*  --   MG  --   --  1.7     CBC: Recent Labs  Lab 12/31/20 1839 01/01/21 0643  WBC 9.2 8.3  NEUTROABS 6.8  --   HGB 13.1 11.9*  HCT 40.3 36.9  MCV 95.0 95.6  PLT 244 218      Lab Results  Component Value Date   HEPBSAG NON REACTIVE 01/04/2021       Microbiology:  Recent Results (from the past 240 hour(s))  Resp Panel by RT-PCR (Flu A&B, Covid) Nasopharyngeal Swab     Status: None   Collection Time: 12/31/20  6:39 PM   Specimen: Nasopharyngeal Swab; Nasopharyngeal(NP) swabs in vial transport medium  Result Value Ref Range Status   SARS Coronavirus 2 by RT PCR NEGATIVE NEGATIVE Final    Comment: (NOTE) SARS-CoV-2 target nucleic acids are NOT DETECTED.  The SARS-CoV-2 RNA is generally detectable in upper respiratory specimens during the acute phase of infection. The lowest concentration of SARS-CoV-2 viral copies this assay can detect is 138 copies/mL. A negative result does not preclude SARS-Cov-2 infection and should not be used as the sole basis for treatment or other patient management decisions. A negative result may occur with  improper specimen collection/handling, submission of specimen other than nasopharyngeal swab, presence of viral mutation(s) within the areas targeted by this assay, and inadequate number of viral copies(<138 copies/mL). A negative result must be combined with clinical observations, patient history, and epidemiological information. The expected result is Negative.  Fact Sheet for Patients:  EntrepreneurPulse.com.au  Fact Sheet for  Healthcare Providers:  IncredibleEmployment.be  This test is no t yet approved or cleared by the [place] and  has been authorized for detection and/or diagnosis of SARS-CoV-2 by FDA under an Emergency Use Authorization (EUA). This EUA will remain  in effect (meaning this test can be used) for the duration of the COVID-19 declaration under Section 564(b)(1) of the Act, 21 U.S.C.section 360bbb-3(b)(1), unless the authorization is terminated  or revoked sooner.       Influenza A by PCR NEGATIVE NEGATIVE Final   Influenza B by PCR NEGATIVE NEGATIVE Final    Comment: (NOTE) The Xpert Xpress SARS-CoV-2/FLU/RSV plus  assay is intended as an aid in the diagnosis of influenza from Nasopharyngeal swab specimens and should not be used as a sole basis for treatment. Nasal washings and aspirates are unacceptable for Xpert Xpress SARS-CoV-2/FLU/RSV testing.  Fact Sheet for Patients: EntrepreneurPulse.com.au  Fact Sheet for Healthcare Providers: IncredibleEmployment.be  This test is not yet approved or cleared by the [place] FDA and has been authorized for detection and/or diagnosis of SARS-CoV-2 by FDA under an Emergency Use Authorization (EUA). This EUA will remain in effect (meaning this test can be used) for the duration of the COVID-19 declaration under Section 564(b)(1) of the Act, 21 U.S.C. section 360bbb-3(b)(1), unless the authorization is terminated or revoked.  Performed at [facility], [address] [place], [place] [address]     Coagulation Studies: No results for input(s): LABPROT, INR in the last 72 hours.  Urinalysis: No results for input(s): COLORURINE, LABSPEC, PHURINE, GLUCOSEU, HGBUR, BILIRUBINUR, KETONESUR, PROTEINUR, UROBILINOGEN, NITRITE, LEUKOCYTESUR in the last 72 hours.  Invalid input(s): APPERANCEUR    Imaging: PERIPHERAL VASCULAR CATHETERIZATION  Result Date: 01/05/2021 See op note    Medications:   . sodium chloride Stopped (01/01/21 1614)  . dialysis solution 1.5% low-MG/low-CA     . (feeding supplement) PROSource Plus  30 mL Oral Daily  . atorvastatin  20 mg Oral Daily  . carvedilol  25 mg Oral BID  . Chlorhexidine Gluconate Cloth  6 each Topical Q0600  . feeding supplement (NEPRO CARB STEADY)  237 mL Oral Q24H  . furosemide  80 mg Oral Daily  . gentamicin cream  1 application Topical Daily  . heparin injection (subcutaneous)  5,000 Units Subcutaneous Q8H  . insulin aspart  0-5 Units Subcutaneous QHS  . insulin aspart  0-9 Units Subcutaneous TID WC  . insulin aspart  4 Units Subcutaneous TID WC  .  insulin detemir  15 Units Subcutaneous BID AC & HS  . metoCLOPramide  5 mg Oral 5 X Daily  . multivitamin  1 tablet Oral Daily  . nicotine  21 mg Transdermal Daily  . pantoprazole  40 mg Oral BID  . polyethylene glycol  17 g Oral Daily  . senna-docusate  2 tablet Oral BID   acetaminophen, ALPRAZolam, HYDROcodone-acetaminophen, hydrOXYzine, metoCLOPramide **OR** metoCLOPramide (REGLAN) injection, ondansetron **OR** ondansetron (ZOFRAN) IV, ondansetron (ZOFRAN) IV, oxyCODONE, traZODone  Assessment/ Plan:  75 y.o. 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 was admitted on 12/31/2020 for Hip fracture ([place]) [S72.009A]  Active Problems:   Diabetes mellitus type 2 with complications, uncontrolled ([place])   Essential hypertension   Coronary atherosclerosis   Single kidney   COPD (chronic obstructive pulmonary disease) with chronic bronchitis (HCC)   CKD (chronic kidney disease), stage III ([place])   Morbid obesity ([place])   Atherosclerosis of native arteries of extremity with  intermittent claudication (HCC)   PAD (peripheral artery disease) ([employer])   History of total knee replacement, left   History of total knee replacement, right   Hip fracture ([place])  Hip fracture ([place]) [S72.009A] Fall, initial encounter [W19.XXXA] Closed 2-part intertrochanteric fracture of left femur, initial encounter ([place]) [S72.142A]     #. ESRD on peritoneal dialysis -peritoneal dialysis ordered tonight -[facility] 8 hours, 4 exchanges, 1600 fills -Spoke with patient about temporary HD while in rehab -Order placed for temp HD cath, will receive today -If placed successfully, will receive HD treatment today -Plan to return to PD once discharged from rehab  #. Anemia of Chronic kidney disease  Lab Results  Component Value Date   HGB 11.9 (L) 01/01/2021   -stable -Will continue to monitor  #. Secondary hyperparathyroidism   No results found for: PTH No results  found for: PHOS Monitor calcium and phos level during this admission   #. Diabetes type 2 with CKD Hemoglobin A1C (no units)  Date Value  07/09/2019 8.5   Hgb A1c MFr Bld (%)  Date Value  01/02/2021 10.0 (H)  -Glucose levels elevated during this admission -SSI and Levemir ordered for management  # Left Hip Fracture -Left Intramedullary femoral nail placement on 01/01/21 -PT/OT ordered -recommending SNF for rehab -appreciate surgical involvement     LOS: [place], NP 2/21/202212:40 [place], [place]

## 2021-01-05 NOTE — Interval H&P Note (Signed)
History and Physical Interval Note:  01/05/2021 10:34 AM  Andrea Stewart  has presented today for surgery, with the diagnosis of ESRD.  The various methods of treatment have been discussed with the patient and family. After consideration of risks, benefits and other options for treatment, the patient has consented to  Procedure(s): DIALYSIS/PERMA CATHETER INSERTION (Right) as a surgical intervention.  The patient's history has been reviewed, patient examined, no change in status, stable for surgery.  I have reviewed the patient's chart and labs.  Questions were answered to the patient's satisfaction.     Leotis Pain

## 2021-01-05 NOTE — Progress Notes (Signed)
Inpatient Diabetes Program Recommendations  AACE/ADA: New Consensus Statement on Inpatient Glycemic Control (2015)  Target Ranges:  Prepandial:   less than 140 mg/dL      Peak postprandial:   less than 180 mg/dL (1-2 hours)      Critically ill patients:  140 - 180 mg/dL   Results for HOLLEIGH, COLESON (MRN [id number]) as of 01/05/2021 13:01  Ref. Range 01/05/2021 00:43 01/05/2021 03:54 01/05/2021 07:44 01/05/2021 10:42 01/05/2021 11:25  Glucose-Capillary Latest Ref Range: 70 - 99 mg/dL 356 (H) 340 (H) 288 (H) 301 (H)  9 units NOVOLOG  277 (H)    Home DM Meds: NPH Insulin 20 units AM/ 30 units PM                             Humalog 2-6 units TID per SSI  Current Orders: Levemir 15 units BID                             Novolog 0-9 units AC + HS      Novolog 4 units TID with meals     Note Pt NPO this AM for HD Cath placement  CBGs >250 since Midnight    MD- If CBGs remain elevated, please consider the following:  1. Increase Levemir to 18 units BID (20% increase)  2. Increase Novolog Meal Coverage to 6 units TID with meals (once pt resumes diet today)    --Will follow patient during hospitalization--  Wyn Quaker RN, MSN, CDE Diabetes Coordinator Inpatient Glycemic Control Team Team Pager: [phone] (8a-5p)

## 2021-01-05 NOTE — Progress Notes (Signed)
  Subjective:  Patient reports pain as mild.    Objective:   VITALS:   Vitals:   01/05/21 1100 01/05/21 1130 01/05/21 1145 01/05/21 1200  BP:  135/68 124/71 (!) 143/70  Pulse:  69 71 68  Resp:  '14 17 16  '$ Temp:      TempSrc:      SpO2: 99% 93% 92% 91%  Weight:      Height:        PHYSICAL EXAM:  Neurologically intact ABD soft Neurovascular intact Sensation intact distally Intact pulses distally Dorsiflexion/Plantar flexion intact Incision: scant drainage No cellulitis present Compartment soft  LABS  Results for orders placed or performed during the hospital encounter of 12/31/20 (from the past 24 hour(s))  Glucose, capillary     Status: None   Collection Time: 01/04/21  4:55 PM  Result Value Ref Range   Glucose-Capillary 74 70 - 99 mg/dL  Glucose, capillary     Status: Abnormal   Collection Time: 01/04/21  7:53 PM  Result Value Ref Range   Glucose-Capillary 261 (H) 70 - 99 mg/dL   Comment 1 Notify RN   Glucose, capillary     Status: Abnormal   Collection Time: 01/04/21 10:34 PM  Result Value Ref Range   Glucose-Capillary 404 (H) 70 - 99 mg/dL   Comment 1 Notify RN   Glucose, capillary     Status: Abnormal   Collection Time: 01/05/21 12:43 AM  Result Value Ref Range   Glucose-Capillary 356 (H) 70 - 99 mg/dL   Comment 1 Notify RN   Glucose, capillary     Status: Abnormal   Collection Time: 01/05/21  3:54 AM  Result Value Ref Range   Glucose-Capillary 340 (H) 70 - 99 mg/dL   Comment 1 Notify RN   Glucose, capillary     Status: Abnormal   Collection Time: 01/05/21  7:44 AM  Result Value Ref Range   Glucose-Capillary 288 (H) 70 - 99 mg/dL  Glucose, capillary     Status: Abnormal   Collection Time: 01/05/21 10:42 AM  Result Value Ref Range   Glucose-Capillary 301 (H) 70 - 99 mg/dL  Glucose, capillary     Status: Abnormal   Collection Time: 01/05/21 11:25 AM  Result Value Ref Range   Glucose-Capillary 277 (H) 70 - 99 mg/dL    PERIPHERAL VASCULAR  CATHETERIZATION  Result Date: 01/05/2021 See op note   Assessment/Plan: Day of Surgery   Active Problems:   Diabetes mellitus type 2 with complications, uncontrolled (HCC)   Essential hypertension   Coronary atherosclerosis   Single kidney   COPD (chronic obstructive pulmonary disease) with chronic bronchitis (HCC)   CKD (chronic kidney disease), stage III (HCC)   Morbid obesity (HCC)   Atherosclerosis of native arteries of extremity with intermittent claudication (HCC)   PAD (peripheral artery disease) ([place])   History of total knee replacement, left   History of total knee replacement, right   Hip fracture (HCC)   Advance diet Up with therapy WBAT LLE Continue pain control Placed on asa 81 BID for 30 days for DVT prophylaxis Follow up March 4  for staple removal. Call office for appt [phone] Discharge per hospitalist SNF  Carlynn Spry , PA-C 01/05/2021, 3:02 PM

## 2021-01-05 NOTE — Progress Notes (Signed)
Physical Therapy Treatment Patient Details Name: Andrea Stewart MRN: [id number] DOB: Jun 22, 1946 Today's Date: 01/05/2021    History of Present Illness Per MD notes: Pt is a 75 y.o. female with history of ESRD on peritoneal dialysis, poorly controlled type 2 diabetes on insulin, hypertension, CAD, PAD, COPD, who presents after a fall.  Pt diagnosed with L hip fracture and is s/p left femoral IM nail.    PT Comments    Pt was pleasant and motivated to participate during the session and was much more alert than during prior session.  Pt put forth good effort throughout the session but remained very limited functionally.  Pt was able to perform standing therex but was unable to clear the surface of the floor with her R foot during multiple amb attempts.  Pt was able to advance her LLE but could only do minimal shuffling with her R foot.  Pt will benefit from PT services in a SNF setting upon discharge to safely address deficits listed in patient problem list for decreased caregiver assistance and eventual return to PLOF.    Follow Up Recommendations  SNF;Supervision for mobility/OOB     Equipment Recommendations  None recommended by PT    Recommendations for Other Services       Precautions / Restrictions Precautions Precautions: Fall Restrictions Weight Bearing Restrictions: Yes LLE Weight Bearing: Weight bearing as tolerated Other Position/Activity Restrictions: Peritoneal dialysis port RLQ    Mobility  Bed Mobility Overal bed mobility: Needs Assistance Bed Mobility: Rolling;Sidelying to Sit Rolling: Min assist Sidelying to sit: Mod assist       General bed mobility comments: Pt required min-mod A to come to sitting at the EOB with cues for sequencing    Transfers Overall transfer level: Needs assistance Equipment used: Rolling walker (2 wheeled) Transfers: Sit to/from Stand Sit to Stand: From elevated surface;Min guard         General transfer comment: Mod verbal  cues for sequencing with improved eccentric and concentric control  Ambulation/Gait Ambulation/Gait assistance: Min assist Gait Distance (Feet): 1 Feet Assistive device: Rolling walker (2 wheeled) Gait Pattern/deviations: Step-to pattern;Trunk flexed;Decreased stance time - left;Antalgic;Shuffle Gait velocity: decreased   General Gait Details: Pt only able to take 2-3 very small, shuffling steps. Pt was able to clear the L foot from the floor but unable to do so with the R   Stairs             Wheelchair Mobility    Modified Rankin (Stroke Patients Only)       Balance Overall balance assessment: Needs assistance   Sitting balance-Leahy Scale: Normal     Standing balance support: Bilateral upper extremity supported;During functional activity Standing balance-Leahy Scale: Fair Standing balance comment: Heavy lean on the RW for support                            Cognition Arousal/Alertness: Awake/alert Behavior During Therapy: WFL for tasks assessed/performed Overall Cognitive Status: Within Functional Limits for tasks assessed                                        Exercises Total Joint Exercises Ankle Circles/Pumps: AROM;Strengthening;Both;10 reps (manual resistance) Quad Sets: 10 reps;Both;Strengthening Gluteal Sets: Strengthening;Both;10 reps Hip ABduction/ADduction: Strengthening;Both;AAROM;10 reps;AROM Long Arc Quad: AROM;Strengthening;Both;10 reps;15 reps Knee Flexion: 10 reps;Both;Strengthening;AROM;15 reps Marching in Standing: AROM;Left;5 reps;Standing Other  Exercises Other Exercises: Standing low amplitude mini-squats 2 x 5 with cues for increased force from the LLE    General Comments        Pertinent Vitals/Pain Pain Assessment: 0-10 Pain Score: 3  Pain Location: L hip Pain Descriptors / Indicators: Aching;Sore Pain Intervention(s): Premedicated before session;Monitored during session    Home Living                       Prior Function            PT Goals (current goals can now be found in the care plan section) Progress towards PT goals: Progressing toward goals    Frequency    BID      PT Plan Current plan remains appropriate    Co-evaluation              AM-PAC PT "6 Clicks" Mobility   Outcome Measure  Help needed turning from your back to your side while in a flat bed without using bedrails?: A Little Help needed moving from lying on your back to sitting on the side of a flat bed without using bedrails?: A Lot Help needed moving to and from a bed to a chair (including a wheelchair)?: Total Help needed standing up from a chair using your arms (e.g., wheelchair or bedside chair)?: A Little Help needed to walk in hospital room?: Total Help needed climbing 3-5 steps with a railing? : Total 6 Click Score: 11    End of Session Equipment Utilized During Treatment: Gait belt Activity Tolerance: Patient limited by pain Patient left: with family/visitor present;with bed alarm set;with call bell/phone within reach;Other (comment) (Pt left sitting at EOB with spouse close to pt in chair, nursing notified) Nurse Communication: Mobility status;Other (comment);Weight bearing status (Per OK from nurse pt left sitting EOB with spouse close by in chair) PT Visit Diagnosis: Other abnormalities of gait and mobility (R26.89);Muscle weakness (generalized) (M62.81);Pain Pain - Right/Left: Left Pain - part of body: Hip     Time: [id number] PT Time Calculation (min) (ACUTE ONLY): 25 min  Charges:  $Therapeutic Exercise: 23-37 mins                     D. Scott Keir Foland PT, DPT 01/05/21, 9:44 AM

## 2021-01-05 NOTE — TOC Progression Note (Addendum)
Transition of Care [facility]) - Progression Note    Patient Details  Name: DIAN GROSMAN MRN: [id number] Date of Birth: May 19, 1946  Transition of Care [facility]) CM/SW [place], LCSW Phone Number: 01/05/2021, 11:27 AM  Clinical Narrative:   Plan for DC to [facility] when medically ready. Informed Dialysis Coordinator Estill Bamberg, who is working on HD chair placement which is needed prior to DC to SNF.  4:10- Dialysis Coordinator Estill Bamberg found dialysis chair at [facility] 5:20 am, asked Magda Paganini with [facility] if this is ok. She is checking with their medical transportation service to see if this time can be accommodated. Magda Paganini reported she cannot take patient after 3 pm so earliest DC would be tomorrow.   Expected Discharge Plan: [place] Barriers to Discharge: Continued Medical Work up  Expected Discharge Plan and Services Expected Discharge Plan: [place] arrangements for the past 2 months: Single Family Home                                       Social Determinants of Health (SDOH) Interventions    Readmission Risk Interventions No flowsheet data found.

## 2021-01-05 NOTE — Progress Notes (Signed)
Patient has a new hemodialysis Catheter.  Unable to dialyze patient at this time. Arterial and venous pressure alarm would not allow patient to be dialyzed. MD made aware.

## 2021-01-05 NOTE — Progress Notes (Signed)
1        [place] at [place] NAME: Andrea Stewart    MR#:  [id number]  DATE OF BIRTH:  05-02-1946  SUBJECTIVE:  CHIEF COMPLAINT:   Chief Complaint  Patient presents with  . Fall    Tripped on rub, no loc, no thinners, left hip pain   Has not had bowel movement.  Complaining of some pain at the site of surgery and requesting pain medicine.  Agreeable for SNF and switching to hemodialysis  REVIEW OF SYSTEMS:  Review of Systems  Constitutional: Positive for malaise/fatigue. Negative for diaphoresis, fever and weight loss.  HENT: Negative for ear discharge, ear pain, hearing loss, nosebleeds, sore throat and tinnitus.   Eyes: Negative for blurred vision and pain.  Respiratory: Negative for cough, hemoptysis, shortness of breath and wheezing.   Cardiovascular: Negative for chest pain, palpitations, orthopnea and leg swelling.  Gastrointestinal: Negative for abdominal pain, blood in stool, constipation, diarrhea, heartburn, nausea and vomiting.  Genitourinary: Negative for dysuria, frequency and urgency.  Musculoskeletal: Positive for joint pain. Negative for back pain and myalgias.  Skin: Negative for itching and rash.  Neurological: Negative for dizziness, tingling, tremors, focal weakness, seizures, weakness and headaches.  Psychiatric/Behavioral: Negative for depression. The patient is not nervous/anxious.    DRUG ALLERGIES:   Allergies  Allergen Reactions  . Morphine Anaphylaxis  . Prednisone Other (See Comments)    "makes me think I am having a heart attack"  . Gabapentin Other (See Comments)    intolerance  . Hydralazine Itching  . Nsaids Diarrhea  . Penicillins Swelling    TOLERATED CEFAZOLIN 07/2020 Has patient had a PCN reaction causing immediate rash, facial/tongue/throat swelling, SOB or lightheadedness with hypotension: Yes Has patient had a PCN reaction causing severe rash involving mucus membranes or skin necrosis: No Has patient had a  PCN reaction that required hospitalization: No Has patient had a PCN reaction occurring within the last 10 years: No If all of the above answers are "NO", then may proceed with Cephalosporin use.    VITALS:  Blood pressure (!) 143/70, pulse 68, temperature 98.9 F (37.2 C), temperature source Oral, resp. rate 16, height '5\' 7"'$  (1.702 m), weight 86.8 kg, SpO2 91 %. PHYSICAL EXAMINATION:  Physical Exam HENT:     Head: Normocephalic and atraumatic.  Eyes:     Conjunctiva/sclera: Conjunctivae normal.     Pupils: Pupils are equal, round, and reactive to light.  Neck:     Thyroid: No thyromegaly.     Trachea: No tracheal deviation.  Cardiovascular:     Rate and Rhythm: Normal rate and regular rhythm.     Heart sounds: Normal heart sounds.  Pulmonary:     Effort: Pulmonary effort is normal. No respiratory distress.     Breath sounds: Normal breath sounds. No wheezing.  Chest:     Chest wall: No tenderness.  Abdominal:     General: Bowel sounds are normal. There is no distension.     Palpations: Abdomen is soft.     Tenderness: There is no abdominal tenderness.  Musculoskeletal:        General: Normal range of motion.     Cervical back: Normal range of motion and neck supple.     Comments: Surgical site/sutures in place without any signs of erythema or infection  Skin:    General: Skin is warm and dry.     Findings: No rash.  Neurological:     Mental Status: She  is alert and oriented to person, place, and time.     Cranial Nerves: No cranial nerve deficit.    LABORATORY PANEL:  Female CBC Recent Labs  Lab 01/01/21 0643  WBC 8.3  HGB 11.9*  HCT 36.9  PLT 218   ------------------------------------------------------------------------------------------------------------------ Chemistries  Recent Labs  Lab 12/31/20 1839 01/01/21 0643 01/03/21 1250  NA 133* 135  --   K 3.0* 3.3*  --   CL 96* 98  --   CO2 26 27  --   GLUCOSE 226* 217*  --   BUN 42* 49*  --   CREATININE  5.96* 6.14*  --   CALCIUM 8.6* 8.6*  --   MG  --   --  1.7  AST 24  --   --   ALT 16  --   --   ALKPHOS 41  --   --   BILITOT 0.5  --   --    RADIOLOGY:  PERIPHERAL VASCULAR CATHETERIZATION  Result Date: 01/05/2021 See op note  ASSESSMENT AND PLAN:  75 year old female with a known history of ESRD on peritoneal dialysis, diabetes, hypertension, coronary artery disease, PAD, COPD is admitted for left hip fracture status post fall  Left hip fracture Status post intramedullary nail on 2/17.  Postop day 4 Pain management and DVT prophylaxis per Ortho PT and OT recommends SNF.  Patient agreeable.  Nephrology is working on switching her to hemodialysis.  Vascular planning on putting new dialysis access for HD today  ESRD-PD per nephrology.  Transitioning to hemodialysis for SNF placement  Chronic diastolic CHF Continue Lasix 80 mg once daily  Hypokalemia: Replete and recheck  DM2-increase insulin Levemir 18 units subcu twice daily and sliding scale Increase NovoLog 6 units 3 times daily with meals per diabetic nurse recommendation  Anxiety-continue Xanax nightly, hydroxyzine as needed  HLD-continue Lipitor  Essential hypertension-continue carvedilol  GERD-continue PPI  Gastroparesis-continue Reglan  Insomnia-continue trazodone  Constipation Add bowel regimen  Obesity Body mass index is 29.97 kg/m.  Net IO Since Admission: 8,100 mL [01/05/21 1548]      Status is: Inpatient  Remains inpatient appropriate because:Ongoing active pain requiring inpatient pain management   Dispo: The patient is from: Home              Anticipated d/c is to: SNF               Anticipated d/c date is: 1 day              Patient currently is not medically stable to d/c.  Plan for new HD dialysis catheter today   difficult to place patient No   DVT prophylaxis:       SCDs Start: 01/01/21 1734 heparin injection 5,000 Units Start: 12/31/20 2245 SCDs Start: 12/31/20 2146      Family Communication: Updated husband at bedside on 2/21   All the records are reviewed and case discussed with Care Management/Social Worker. Management plans discussed with the patient, nursing, family, nephrology and they are in agreement.  CODE STATUS: Full Code Level of care: Med-Surg  TOTAL TIME TAKING CARE OF THIS PATIENT: 35 minutes.   More than 50% of the time was spent in counseling/coordination of care: YES  POSSIBLE D/C IN 1-2 DAYS, DEPENDING ON CLINICAL CONDITION.   Max Sane M.D on 01/05/2021 at 3:48 PM  [facility]   CC: Primary care physician; Rusty Aus, MD  Note: This dictation was prepared with Dragon dictation along with smaller phrase  technology. Any transcriptional errors that result from this process are unintentional.

## 2021-01-06 LAB — RENAL FUNCTION PANEL
Albumin: 1.9 g/dL — ABNORMAL LOW (ref 3.5–5.0)
Albumin: 2 g/dL — ABNORMAL LOW (ref 3.5–5.0)
Anion gap: 9 (ref 5–15)
Anion gap: 7 (ref 5–15)
BUN: 59 mg/dL — ABNORMAL HIGH (ref 8–23)
BUN: 41 mg/dL — ABNORMAL HIGH (ref 8–23)
CO2: 27 mmol/L (ref 22–32)
CO2: 25 mmol/L (ref 22–32)
Calcium: 8.1 mg/dL — ABNORMAL LOW (ref 8.9–10.3)
Calcium: 8 mg/dL — ABNORMAL LOW (ref 8.9–10.3)
Chloride: 91 mmol/L — ABNORMAL LOW (ref 98–111)
Chloride: 97 mmol/L — ABNORMAL LOW (ref 98–111)
Creatinine, Ser: 4.9 mg/dL — ABNORMAL HIGH (ref 0.44–1.00)
Creatinine, Ser: 3.7 mg/dL — ABNORMAL HIGH (ref 0.44–1.00)
GFR, Estimated: 9 mL/min — ABNORMAL LOW (ref >60)
GFR, Estimated: 12 mL/min — ABNORMAL LOW (ref >60)
Glucose, Bld: 185 mg/dL — ABNORMAL HIGH (ref 70–99)
Glucose, Bld: 70 mg/dL (ref 70–99)
Phosphorus: 5.4 mg/dL — ABNORMAL HIGH (ref 2.5–4.6)
Phosphorus: 4.1 mg/dL (ref 2.5–4.6)
Potassium: 3.1 mmol/L — ABNORMAL LOW (ref 3.5–5.1)
Potassium: 3.1 mmol/L — ABNORMAL LOW (ref 3.5–5.1)
Sodium: 127 mmol/L — ABNORMAL LOW (ref 135–145)
Sodium: 129 mmol/L — ABNORMAL LOW (ref 135–145)

## 2021-01-06 LAB — CBC
HCT: 31.6 % — ABNORMAL LOW (ref 36.0–46.0)
HCT: 28.6 % — ABNORMAL LOW (ref 36.0–46.0)
Hemoglobin: 10.5 g/dL — ABNORMAL LOW (ref 12.0–15.0)
Hemoglobin: 9.4 g/dL — ABNORMAL LOW (ref 12.0–15.0)
MCH: 31.4 pg (ref 26.0–34.0)
MCH: 30.9 pg (ref 26.0–34.0)
MCHC: 33.2 g/dL (ref 30.0–36.0)
MCHC: 32.9 g/dL (ref 30.0–36.0)
MCV: 94.6 fL (ref 80.0–100.0)
MCV: 94.1 fL (ref 80.0–100.0)
Platelets: 184 10*3/uL (ref 150–400)
Platelets: 189 10*3/uL (ref 150–400)
RBC: 3.34 MIL/uL — ABNORMAL LOW (ref 3.87–5.11)
RBC: 3.04 MIL/uL — ABNORMAL LOW (ref 3.87–5.11)
RDW: 13.2 % (ref 11.5–15.5)
RDW: 13 % (ref 11.5–15.5)
WBC: 7.2 10*3/uL (ref 4.0–10.5)
WBC: 8.6 10*3/uL (ref 4.0–10.5)
nRBC: 0 % (ref 0.0–0.2)
nRBC: 0 % (ref 0.0–0.2)

## 2021-01-06 LAB — GLUCOSE, CAPILLARY
Glucose-Capillary: 128 mg/dL — ABNORMAL HIGH (ref 70–99)
Glucose-Capillary: 203 mg/dL — ABNORMAL HIGH (ref 70–99)
Glucose-Capillary: 224 mg/dL — ABNORMAL HIGH (ref 70–99)
Glucose-Capillary: 244 mg/dL — ABNORMAL HIGH (ref 70–99)
Glucose-Capillary: 65 mg/dL — ABNORMAL LOW (ref 70–99)
Glucose-Capillary: 68 mg/dL — ABNORMAL LOW (ref 70–99)
Glucose-Capillary: 74 mg/dL (ref 70–99)
Glucose-Capillary: 92 mg/dL (ref 70–99)

## 2021-01-06 LAB — HEPATITIS B DNA, ULTRAQUANTITATIVE, PCR
HBV DNA SERPL PCR-ACNC: NOT DETECTED IU/mL
HBV DNA SERPL PCR-LOG IU: UNDETERMINED log10 IU/mL

## 2021-01-06 LAB — HEPATITIS B SURFACE ANTIBODY, QUANTITATIVE: Hep B S AB Quant (Post): <3.1 m[IU]/mL — ABNORMAL LOW (ref >9.9)

## 2021-01-06 MED ORDER — PENTAFLUOROPROP-TETRAFLUOROETH EX AERO
1.0000 "application " | INHALATION_SPRAY | CUTANEOUS | Status: DC | PRN
  Filled 2021-01-06: qty 30

## 2021-01-06 MED ORDER — SODIUM CHLORIDE 0.9 % IV SOLN: 100.0000 mL | INTRAVENOUS | Status: DC | PRN

## 2021-01-06 MED ORDER — HEPARIN SODIUM (PORCINE) 1000 UNIT/ML DIALYSIS
1000.0000 [IU] | INTRAMUSCULAR | Status: DC | PRN
  Filled 2021-01-06: qty 1

## 2021-01-06 MED ORDER — LIDOCAINE-PRILOCAINE 2.5-2.5 % EX CREA
1.0000 "application " | TOPICAL_CREAM | CUTANEOUS | Status: DC | PRN
  Filled 2021-01-06: qty 5

## 2021-01-06 MED ORDER — LIDOCAINE HCL (PF) 1 % IJ SOLN
5.0000 mL | INTRAMUSCULAR | Status: DC | PRN
  Filled 2021-01-06: qty 5

## 2021-01-06 MED ORDER — FLEET ENEMA 7-19 GM/118ML RE ENEM
1.0000 | ENEMA | RECTAL | Status: AC
  Administered 2021-01-06: 1 via RECTAL

## 2021-01-06 MED ORDER — HEPARIN SODIUM (PORCINE) 1000 UNIT/ML IJ SOLN
4200.0000 [IU] | Freq: Once | INTRAMUSCULAR | Status: AC
  Administered 2021-01-06: 4200 [IU]

## 2021-01-06 MED ORDER — ALTEPLASE 2 MG IJ SOLR
2.0000 mg | Freq: Once | INTRAMUSCULAR | Status: DC | PRN
  Filled 2021-01-06: qty 2

## 2021-01-06 NOTE — Progress Notes (Signed)
Subjective:  Patient reports pain as mild.    Objective:   VITALS:   Vitals:   01/05/21 2035 01/06/21 0006 01/06/21 0435 01/06/21 0620  BP: (!) 108/49 (!) 125/56 (!) 134/54   Pulse: 66 70 69   Resp: '16 17 17   '$ Temp: 98.3 F (36.8 C) 97.8 F (36.6 C) 98.1 F (36.7 C)   TempSrc:      SpO2: 99% 97% 100%   Weight:    87.3 kg  Height:        PHYSICAL EXAM:  Neurologically intact ABD soft Neurovascular intact Sensation intact distally Intact pulses distally Dorsiflexion/Plantar flexion intact Incision: no drainage No cellulitis present Compartment soft  LABS  Results for orders placed or performed during the hospital encounter of 12/31/20 (from the past 24 hour(s))  Glucose, capillary     Status: Abnormal   Collection Time: 01/05/21 10:42 AM  Result Value Ref Range   Glucose-Capillary 301 (H) 70 - 99 mg/dL  Glucose, capillary     Status: Abnormal   Collection Time: 01/05/21 11:25 AM  Result Value Ref Range   Glucose-Capillary 277 (H) 70 - 99 mg/dL  Glucose, capillary     Status: Abnormal   Collection Time: 01/05/21  4:17 PM  Result Value Ref Range   Glucose-Capillary 207 (H) 70 - 99 mg/dL  Renal function panel     Status: Abnormal   Collection Time: 01/05/21  6:00 PM  Result Value Ref Range   Sodium 126 (L) 135 - 145 mmol/L   Potassium 3.0 (L) 3.5 - 5.1 mmol/L   Chloride 92 (L) 98 - 111 mmol/L   CO2 27 22 - 32 mmol/L   Glucose, Bld 155 (H) 70 - 99 mg/dL   BUN 60 (H) 8 - 23 mg/dL   Creatinine, Ser 5.16 (H) 0.44 - 1.00 mg/dL   Calcium 8.1 (L) 8.9 - 10.3 mg/dL   Phosphorus 5.2 (H) 2.5 - 4.6 mg/dL   Albumin 1.9 (L) 3.5 - 5.0 g/dL   GFR, Estimated 8 (L) >60 mL/min   Anion gap 7 5 - 15  Phosphorus     Status: Abnormal   Collection Time: 01/05/21  6:00 PM  Result Value Ref Range   Phosphorus 4.8 (H) 2.5 - 4.6 mg/dL  Glucose, capillary     Status: None   Collection Time: 01/05/21  8:43 PM  Result Value Ref Range   Glucose-Capillary 87 70 - 99 mg/dL   Glucose, capillary     Status: Abnormal   Collection Time: 01/06/21 12:07 AM  Result Value Ref Range   Glucose-Capillary 203 (H) 70 - 99 mg/dL  Glucose, capillary     Status: Abnormal   Collection Time: 01/06/21  4:38 AM  Result Value Ref Range   Glucose-Capillary 244 (H) 70 - 99 mg/dL  CBC     Status: Abnormal   Collection Time: 01/06/21  6:28 AM  Result Value Ref Range   WBC 7.2 4.0 - 10.5 K/uL   RBC 3.34 (L) 3.87 - 5.11 MIL/uL   Hemoglobin 10.5 (L) 12.0 - 15.0 g/dL   HCT 31.6 (L) 36.0 - 46.0 %   MCV 94.6 80.0 - 100.0 fL   MCH 31.4 26.0 - 34.0 pg   MCHC 33.2 30.0 - 36.0 g/dL   RDW 13.2 11.5 - 15.5 %   Platelets 184 150 - 400 K/uL   nRBC 0.0 0.0 - 0.2 %    PERIPHERAL VASCULAR CATHETERIZATION  Result Date: 01/05/2021 See op note   Assessment/Plan:  1 Day Post-Op   Active Problems:   Diabetes mellitus type 2 with complications, uncontrolled ([place])   Essential hypertension   Coronary atherosclerosis   Single kidney   COPD (chronic obstructive pulmonary disease) with chronic bronchitis (HCC)   CKD (chronic kidney disease), stage III (HCC)   Morbid obesity ([place])   Atherosclerosis of native arteries of extremity with intermittent claudication (HCC)   PAD (peripheral artery disease) ([place])   History of total knee replacement, left   History of total knee replacement, right   Hip fracture (HCC)   Advance diet Up with therapy WBAT LLE Continue pain control Placed on asa 81 BID for 30 days for DVT prophylaxis Follow up March 4  for staple removal. Call office for appt [phone] Discharge per hospitalist SNF  Carlynn Spry , PA-C 01/06/2021, 7:58 AM

## 2021-01-06 NOTE — Progress Notes (Signed)
This patient came for her first Hemodialysis treatment today. As per Nephrology continuity of care report, the team was unable to run cvc on attempt yesterday. On arrival today, the cvc had some old serosanguinous drainage , dressing was changed pre HD. No signs of infection noted. Patient arrived on room air and c/o pain 5/10 form her back and left hip. Hd was initiated with stable vital signs and pain medication was administered as ordered. About 36mns rtd , the patient requested to come off/terminate HD stated " I want to come off now" Dr. SCandiss Norsewas called promptly and made aware of patient's request. OK to terminate HD as per DR. Singh. Blood was then returned and stable vital signs obtained post rinse back. Patient also reported decreease in pain upon post assessment.

## 2021-01-06 NOTE — Progress Notes (Signed)
PT Cancellation Note  Patient Details Name: Andrea Stewart MRN: [id number] DOB: 1946/01/25   Cancelled Treatment:    Reason Eval/Treat Not Completed: Patient declined, no reason specified.  Pt currently sleeping upon arrival.  Husband attempted to wake pt.  Pt refused therapy upon wakening.  Pt will be seen at later date/time as medically appropriate.     Gwenlyn Saran, PT, DPT 01/06/21, 4:01 PM

## 2021-01-06 NOTE — Progress Notes (Signed)
1        [place] at [place] NAME: Andrea Stewart    MR#:  [id number]  DATE OF BIRTH:  10/18/1946  SUBJECTIVE:  CHIEF COMPLAINT:   Chief Complaint  Patient presents with  . Fall    Tripped on rub, no loc, no thinners, left hip pain   Has right IJ permacath in place now.  Still has not had bowel movement and requesting enema. REVIEW OF SYSTEMS:  Review of Systems  Constitutional: Positive for malaise/fatigue. Negative for diaphoresis, fever and weight loss.  HENT: Negative for ear discharge, ear pain, hearing loss, nosebleeds, sore throat and tinnitus.   Eyes: Negative for blurred vision and pain.  Respiratory: Negative for cough, hemoptysis, shortness of breath and wheezing.   Cardiovascular: Negative for chest pain, palpitations, orthopnea and leg swelling.  Gastrointestinal: Negative for abdominal pain, blood in stool, constipation, diarrhea, heartburn, nausea and vomiting.  Genitourinary: Negative for dysuria, frequency and urgency.  Musculoskeletal: Positive for joint pain. Negative for back pain and myalgias.  Skin: Negative for itching and rash.  Neurological: Negative for dizziness, tingling, tremors, focal weakness, seizures, weakness and headaches.  Psychiatric/Behavioral: Negative for depression. The patient is not nervous/anxious.    DRUG ALLERGIES:   Allergies  Allergen Reactions  . Morphine Anaphylaxis  . Prednisone Other (See Comments)    "makes me think I am having a heart attack"  . Gabapentin Other (See Comments)    intolerance  . Hydralazine Itching  . Nsaids Diarrhea  . Penicillins Swelling    TOLERATED CEFAZOLIN 07/2020 Has patient had a PCN reaction causing immediate rash, facial/tongue/throat swelling, SOB or lightheadedness with hypotension: Yes Has patient had a PCN reaction causing severe rash involving mucus membranes or skin necrosis: No Has patient had a PCN reaction that required hospitalization: No Has patient had a  PCN reaction occurring within the last 10 years: No If all of the above answers are "NO", then may proceed with Cephalosporin use.    VITALS:  Blood pressure (!) 120/54, pulse 68, temperature 98.6 F (37 C), temperature source Oral, resp. rate 14, height '5\' 7"'$  (1.702 m), weight 87.3 kg, SpO2 91 %. PHYSICAL EXAMINATION:  Physical Exam HENT:     Head: Normocephalic and atraumatic.  Eyes:     Conjunctiva/sclera: Conjunctivae normal.     Pupils: Pupils are equal, round, and reactive to light.  Neck:     Thyroid: No thyromegaly.     Trachea: No tracheal deviation.  Cardiovascular:     Rate and Rhythm: Normal rate and regular rhythm.     Heart sounds: Normal heart sounds.  Pulmonary:     Effort: Pulmonary effort is normal. No respiratory distress.     Breath sounds: Normal breath sounds. No wheezing.  Chest:     Chest wall: No tenderness.  Abdominal:     General: Bowel sounds are normal. There is no distension.     Palpations: Abdomen is soft.     Tenderness: There is no abdominal tenderness.  Musculoskeletal:        General: Normal range of motion.     Cervical back: Normal range of motion and neck supple.     Comments: Surgical site/sutures in place without any signs of erythema or infection  Skin:    General: Skin is warm and dry.     Findings: No rash.  Neurological:     Mental Status: She is alert and oriented to person, place, and time.  Cranial Nerves: No cranial nerve deficit.   Dialysis access: Right IJ tunnel hemodialysis catheter in place 2/21 LABORATORY PANEL:  Female CBC Recent Labs  Lab 01/06/21 0628  WBC 7.2  HGB 10.5*  HCT 31.6*  PLT 184   ------------------------------------------------------------------------------------------------------------------ Chemistries  Recent Labs  Lab 12/31/20 1839 01/01/21 0643 01/03/21 1250 01/05/21 1800 01/06/21 1100  NA 133*   < >  --    < > 127*  K 3.0*   < >  --    < > 3.1*  CL 96*   < >  --    < > 91*   CO2 26   < >  --    < > 27  GLUCOSE 226*   < >  --    < > 185*  BUN 42*   < >  --    < > 59*  CREATININE 5.96*   < >  --    < > 4.90*  CALCIUM 8.6*   < >  --    < > 8.1*  MG  --   --  1.7  --   --   AST 24  --   --   --   --   ALT 16  --   --   --   --   ALKPHOS 41  --   --   --   --   BILITOT 0.5  --   --   --   --    < > = values in this interval not displayed.   RADIOLOGY:  No results found. ASSESSMENT AND PLAN:  75 year old female with a known history of ESRD on peritoneal dialysis, diabetes, hypertension, coronary artery disease, PAD, COPD is admitted for left hip fracture status post fall  2/17: S/p intramedullary nail 2/18-2/20: Working with therapy and hope to go home but remains weak and will need SNF so PD is switched to hemodialysis for SNF placement 2/21-right IJ cuffed tunneled hemodialysis catheter placed 2/22: Patient has SNF bed at [facility] but will need to prove that she is able to sit up in a chair for hemodialysis for at least 4 hours which she has not been able to do so far.  She will have 1 more dialysis session tomorrow and if she can sit for 4 hours she may be able to go to SNF   Left hip fracture Status post intramedullary nail on 2/17.  Postop day 5 Pain management and DVT prophylaxis per Ortho PT and OT recommends SNF.  Patient agreeable.    ESRD-PD per nephrology.  Transitioning to hemodialysis for SNF placement S/p right IJ tunneled HD catheter placed on 2/21 by Dr. [facility] per nephrology.  She will need to sit for 4 hours in dialysis chair to be able to go to SNF  Chronic diastolic CHF Well compensated at this time  Hypokalemia: Replete and recheck  DM2-continue insulin Levemir 18 units subcu twice daily and sliding scale Continue NovoLog 6 units 3 times daily with meals per diabetic nurse recommendation  Anxiety-continue Xanax nightly, hydroxyzine as needed  HLD-continue Lipitor  Essential hypertension-continue  carvedilol  GERD-continue PPI  Gastroparesis-continue Reglan  Insomnia-continue trazodone  Constipation Continue bowel regimen, adding Fleet enema as she still has not had bowel movement  Obesity Body mass index is 30.14 kg/m.  Net IO Since Admission: 8,229 mL [01/06/21 1508]      Status is: Inpatient  Remains inpatient appropriate because:Ongoing active pain requiring inpatient pain management  Dispo: The patient is from: Home              Anticipated d/c is to: SNF               Anticipated d/c date is: 1 day              Patient currently is not medically stable to d/c. patient has bed at [facility] where she could potentially go tomorrow on 2/23 but needs to sit in the dialysis chair for at least 4 hours before SNF discharge and waiting for bowel movement   difficult to place patient No   DVT prophylaxis:       SCDs Start: 01/01/21 1734 heparin injection 5,000 Units Start: 12/31/20 2245 SCDs Start: 12/31/20 2146     Family Communication: Updated husband at bedside on 2/22   All the records are reviewed and case discussed with Care Management/Social Worker. Management plans discussed with the patient, nursing, family, nephrology and they are in agreement.  CODE STATUS: Full Code Level of care: Med-Surg  TOTAL TIME TAKING CARE OF THIS PATIENT: 35 minutes.   More than 50% of the time was spent in counseling/coordination of care: YES  POSSIBLE D/C IN 1-2 DAYS, DEPENDING ON CLINICAL CONDITION.   Max Sane M.D on 01/06/2021 at 3:08 PM  [facility]   CC: Primary care physician; Rusty Aus, MD  Note: This dictation was prepared with Dragon dictation along with smaller phrase technology. Any transcriptional errors that result from this process are unintentional.

## 2021-01-06 NOTE — Progress Notes (Signed)
PT Cancellation Note  Patient Details Name: Andrea Stewart MRN: [id number] DOB: 1946-05-10   Cancelled Treatment:    Reason Eval/Treat Not Completed: Patient at procedure or test/unavailable.  Pt chart reviewed and therapist attempted to see pt, but was not in room.  Nursing reports she was taken by transport team to have scan performed.  Will attempt to see pt at later time/date as medically appropriate.   Gwenlyn Saran, PT, DPT 01/06/21, 10:28 AM

## 2021-01-06 NOTE — TOC Progression Note (Addendum)
Transition of Care [facility]) - Progression Note    Patient Details  Name: Andrea Stewart MRN: [id number] Date of Birth: 1946-02-04  Transition of Care [facility]) CM/SW [place], RN Phone Number: 01/06/2021, 12:38 PM  Clinical Narrative:  Update:  16:33 Colletta [place] at  [facility] provided Approval numbers:  [id number] [id number]; SNF [phone].  Colletta [place] stated that patient has 5 days to transfer and is approved for 7 days at [facility]. RN CM contacted [facility], spoke to [place]. Authorization pending for EMS and transfer to [facility] for 07 Jan 2021.  Per HD coordinator at [facility], patient has yet to sit in a chair (required for HD) prior to discharge.  RN CM is corresponding with floor staff to obtain update on whether patient is able to sit.    Expected Discharge Plan: [place] Barriers to Discharge: Continued Medical Work up  Expected Discharge Plan and Services Expected Discharge Plan: [place] arrangements for the past 2 months: Single Family Home                                       Social Determinants of Health (SDOH) Interventions    Readmission Risk Interventions No flowsheet data found.

## 2021-01-06 NOTE — Progress Notes (Signed)
[place], [name] 01/06/21  Subjective:   Andrea Stewart is a 75 y.o. 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 She states she was getting out of shower and fell on the carpet.   She is admitted with a  Left Hip Fracture.  Patient seen laying in bed Alert and oriented Says she has not had a BM since last week.  RN at bedside with a enema Denies shortness of breath and chest pain Denies nausea and vomiting   Objective:  Vital signs in last 24 hours:  Temp:  [97.8 F (36.6 C)-98.3 F (36.8 C)] 97.8 F (36.6 C) (02/22 0820) Pulse Rate:  [61-71] 62 (02/22 1255) Resp:  [14-17] 14 (02/22 0925) BP: (108-177)/(48-87) 112/66 (02/22 1250) SpO2:  [91 %-100 %] 93 % (02/22 1255) Weight:  [87.3 kg] 87.3 kg (02/22 0620)  Weight change: 0.5 kg Filed Weights   01/04/21 0500 01/05/21 0500 01/06/21 0620  Weight: 86.4 kg 86.8 kg 87.3 kg    Intake/Output:    Intake/Output Summary (Last 24 hours) at 01/06/2021 1308 Last data filed at 01/06/2021 1245 Gross per 24 hour  Intake 60 ml  Output -69 ml  Net 129 ml     Physical Exam: General: NAD, laying in bed  HEENT Normocephalic, moist oral mucosa  Pulm/lungs Clear  CVS/Heart regular  Abdomen:  Soft, non-tender  Extremities: No peripheral edema  Neurologic: Alert, oriented, able to answer questions  Skin: Left hip bandage  Access: PD catheter, RT IJ Permcath       Basic Metabolic Panel:  Recent Labs  Lab 12/31/20 1839 01/01/21 0643 01/03/21 1250 01/05/21 1800 01/06/21 1100  NA 133* 135  --  126* 127*  K 3.0* 3.3*  --  3.0* 3.1*  CL 96* 98  --  92* 91*  CO2 26 27  --  27 27  GLUCOSE 226* 217*  --  155* 185*  BUN 42* 49*  --  60* 59*  CREATININE 5.96* 6.14*  --  5.16* 4.90*  CALCIUM 8.6* 8.6*  --  8.1* 8.1*  MG  --   --  1.7  --   --   PHOS  --   --   --  4.8*  5.2* 5.4*     CBC: Recent Labs  Lab 12/31/20 1839  01/01/21 0643 01/06/21 0628  WBC 9.2 8.3 7.2  NEUTROABS 6.8  --   --   HGB 13.1 11.9* 10.5*  HCT 40.3 36.9 31.6*  MCV 95.0 95.6 94.6  PLT 244 218 184      Lab Results  Component Value Date   HEPBSAG NON REACTIVE 01/04/2021      Microbiology:  Recent Results (from the past 240 hour(s))  Resp Panel by RT-PCR (Flu A&B, Covid) Nasopharyngeal Swab     Status: None   Collection Time: 12/31/20  6:39 PM   Specimen: Nasopharyngeal Swab; Nasopharyngeal(NP) swabs in vial transport medium  Result Value Ref Range Status   SARS Coronavirus 2 by RT PCR NEGATIVE NEGATIVE Final    Comment: (NOTE) SARS-CoV-2 target nucleic acids are NOT DETECTED.  The SARS-CoV-2 RNA is generally detectable in upper respiratory specimens during the acute phase of infection. The lowest concentration of SARS-CoV-2 viral copies this assay can detect is 138 copies/mL. A negative result does not preclude SARS-Cov-2 infection and should not be used as the sole basis for treatment or other patient management decisions. A negative result may  occur with  improper specimen collection/handling, submission of specimen other than nasopharyngeal swab, presence of viral mutation(s) within the areas targeted by this assay, and inadequate number of viral copies(<138 copies/mL). A negative result must be combined with clinical observations, patient history, and epidemiological information. The expected result is Negative.  Fact Sheet for Patients:  EntrepreneurPulse.com.au  Fact Sheet for Healthcare Providers:  IncredibleEmployment.be  This test is no t yet approved or cleared by the [place] FDA and  has been authorized for detection and/or diagnosis of SARS-CoV-2 by FDA under an Emergency Use Authorization (EUA). This EUA will remain  in effect (meaning this test can be used) for the duration of the COVID-19 declaration under Section 564(b)(1) of the Act, 21 U.S.C.section  360bbb-3(b)(1), unless the authorization is terminated  or revoked sooner.       Influenza A by PCR NEGATIVE NEGATIVE Final   Influenza B by PCR NEGATIVE NEGATIVE Final    Comment: (NOTE) The Xpert Xpress SARS-CoV-2/FLU/RSV plus assay is intended as an aid in the diagnosis of influenza from Nasopharyngeal swab specimens and should not be used as a sole basis for treatment. Nasal washings and aspirates are unacceptable for Xpert Xpress SARS-CoV-2/FLU/RSV testing.  Fact Sheet for Patients: EntrepreneurPulse.com.au  Fact Sheet for Healthcare Providers: IncredibleEmployment.be  This test is not yet approved or cleared by the [place] FDA and has been authorized for detection and/or diagnosis of SARS-CoV-2 by FDA under an Emergency Use Authorization (EUA). This EUA will remain in effect (meaning this test can be used) for the duration of the COVID-19 declaration under Section 564(b)(1) of the Act, 21 U.S.C. section 360bbb-3(b)(1), unless the authorization is terminated or revoked.  Performed at [facility], [address] [place], [place] [address]     Coagulation Studies: No results for input(s): LABPROT, INR in the last 72 hours.  Urinalysis: No results for input(s): COLORURINE, LABSPEC, PHURINE, GLUCOSEU, HGBUR, BILIRUBINUR, KETONESUR, PROTEINUR, UROBILINOGEN, NITRITE, LEUKOCYTESUR in the last 72 hours.  Invalid input(s): APPERANCEUR    Imaging: PERIPHERAL VASCULAR CATHETERIZATION  Result Date: 01/05/2021 See op note    Medications:   . sodium chloride Stopped (01/01/21 1614)  . dialysis solution 1.5% low-MG/low-CA     . (feeding supplement) PROSource Plus  30 mL Oral Daily  . aspirin EC  81 mg Oral BID  . atorvastatin  20 mg Oral Daily  . carvedilol  25 mg Oral BID  . Chlorhexidine Gluconate Cloth  6 each Topical Q0600  . feeding supplement (NEPRO CARB STEADY)  237 mL Oral Q24H  . furosemide  80 mg Oral  Daily  . gentamicin cream  1 application Topical Daily  . heparin injection (subcutaneous)  5,000 Units Subcutaneous Q8H  . insulin aspart  0-5 Units Subcutaneous QHS  . insulin aspart  0-9 Units Subcutaneous TID WC  . insulin aspart  6 Units Subcutaneous TID WC  . insulin detemir  18 Units Subcutaneous BID AC & HS  . metoCLOPramide  5 mg Oral 5 X Daily  . multivitamin  1 tablet Oral Daily  . nicotine  21 mg Transdermal Daily  . pantoprazole  40 mg Oral BID  . polyethylene glycol  17 g Oral Daily  . senna-docusate  2 tablet Oral BID   acetaminophen, ALPRAZolam, HYDROcodone-acetaminophen, hydrOXYzine, metoCLOPramide **OR** metoCLOPramide (REGLAN) injection, ondansetron **OR** ondansetron (ZOFRAN) IV, ondansetron (ZOFRAN) IV, oxyCODONE, traZODone  Assessment/ Plan:  75 y.o. female with PMHX of ESRD on peritoneal dialysis, HTN, CAD, PAD, COPD, and Diabetes Type 2  on insulin was admitted on 12/31/2020 for Hip fracture ([place]) [S72.009A]  Active Problems:   Diabetes mellitus type 2 with complications, uncontrolled ([place])   Essential hypertension   Coronary atherosclerosis   Single kidney   COPD (chronic obstructive pulmonary disease) with chronic bronchitis (HCC)   CKD (chronic kidney disease), stage III ([place])   Morbid obesity ([place])   Atherosclerosis of native arteries of extremity with intermittent claudication (HCC)   PAD (peripheral artery disease) ([place])   History of total knee replacement, left   History of total knee replacement, right   Hip fracture ([place])  Hip fracture ([place]) [S72.009A] Fall, initial encounter [W19.XXXA] Closed 2-part intertrochanteric fracture of left femur, initial encounter ([place]) [S72.142A]     #. ESRD on peritoneal dialysis -Rt IJ pemcath placed yesterday -Unable to dialyze yesterday likely due to swelling from procedure -Receiving dialysis today -Outpatient center arranged at [address], TTS -Will encourage OOB to chair to build stamina for outpatient  dialyisis  #. Anemia of Chronic kidney disease  Lab Results  Component Value Date   HGB 10.5 (L) 01/06/2021   -stable now -Will continue to monitor  #. Secondary hyperparathyroidism   No results found for: PTH Lab Results  Component Value Date   PHOS 5.4 (H) 01/06/2021   Monitor calcium and phos level during this admission   #. Diabetes type 2 with CKD Hemoglobin A1C (no units)  Date Value  07/09/2019 8.5   Hgb A1c MFr Bld (%)  Date Value  01/02/2021 10.0 (H)  -Glucose levels elevated during this admission -SSI and Levemir ordered for management  # Left Hip Fracture -Left Intramedullary femoral nail placement on 01/01/21 -PT/OT ordered -SNF arranged at [facility] -appreciate surgical involvement     LOS: [place], NP 2/22/20221:08 [place], [place]

## 2021-01-06 NOTE — Hospital Course (Addendum)
75 year old female with a known history of ESRD on peritoneal dialysis, diabetes, hypertension, coronary artery disease, PAD, COPD is admitted for left hip fracture status post fall.  2/17: Surgery - intramedullary nail repair 2/18-2/20: Working with therapy and hope to go home but remains weak and will need SNF.   Peritoneal dialysis switched to hemodialysis for SNF placement, as PD unable to be done at [facility] 2/21-Right IJ cuffed tunneled hemodialysis catheter placed 2/22-2/24: Patient has SNF bed at [facility] but tolerance for sitting in chair 4 hours was initially limited by pain and weakness.  This improved over last few days and patient now able to tolerate sitting in chair for at least 4 hours, and appropriate for d/c to rehab and receive outpatient hemodialysis

## 2021-01-06 NOTE — TOC Progression Note (Signed)
Transition of Care [facility]) - Progression Note    Patient Details  Name: [name] MRN: [id number] Date of Birth: 1946-06-14  Transition of Care [facility]) CM/SW [place], LCSW Phone Number: 01/06/2021, 9:40 AM  Clinical Narrative:  [facility] confirmed they can transport patient to her 5:20 am chair time. They will not have a bed until tomorrow. Sent secure chat to confirm patient would be stable for discharge tomorrow. Asked him to order COVID test if plan for discharge tomorrow. Will start authorization once confirmed.   Expected Discharge Plan: [place] Barriers to Discharge: Continued Medical Work up  Expected Discharge Plan and Services Expected Discharge Plan: [place] arrangements for the past 2 months: Single Family Home                                       Social Determinants of Health (SDOH) Interventions    Readmission Risk Interventions No flowsheet data found.

## 2021-01-06 NOTE — Plan of Care (Signed)

## 2021-01-07 DIAGNOSIS — S72002D Fracture of unspecified part of neck of left femur, subsequent encounter for closed fracture with routine healing: Secondary | ICD-10-CM

## 2021-01-07 LAB — GLUCOSE, CAPILLARY
Glucose-Capillary: 105 mg/dL — ABNORMAL HIGH (ref 70–99)
Glucose-Capillary: 109 mg/dL — ABNORMAL HIGH (ref 70–99)
Glucose-Capillary: 114 mg/dL — ABNORMAL HIGH (ref 70–99)
Glucose-Capillary: 182 mg/dL — ABNORMAL HIGH (ref 70–99)
Glucose-Capillary: 310 mg/dL — ABNORMAL HIGH (ref 70–99)
Glucose-Capillary: 59 mg/dL — ABNORMAL LOW (ref 70–99)
Glucose-Capillary: 93 mg/dL (ref 70–99)

## 2021-01-07 LAB — BASIC METABOLIC PANEL
Anion gap: 9 (ref 5–15)
BUN: 41 mg/dL — ABNORMAL HIGH (ref 8–23)
CO2: 24 mmol/L (ref 22–32)
Calcium: 8 mg/dL — ABNORMAL LOW (ref 8.9–10.3)
Chloride: 95 mmol/L — ABNORMAL LOW (ref 98–111)
Creatinine, Ser: 3.66 mg/dL — ABNORMAL HIGH (ref 0.44–1.00)
GFR, Estimated: 12 mL/min — ABNORMAL LOW (ref >60)
Glucose, Bld: 102 mg/dL — ABNORMAL HIGH (ref 70–99)
Potassium: 3.3 mmol/L — ABNORMAL LOW (ref 3.5–5.1)
Sodium: 128 mmol/L — ABNORMAL LOW (ref 135–145)

## 2021-01-07 LAB — CBC
HCT: 28.9 % — ABNORMAL LOW (ref 36.0–46.0)
Hemoglobin: 9.7 g/dL — ABNORMAL LOW (ref 12.0–15.0)
MCH: 31.5 pg (ref 26.0–34.0)
MCHC: 33.6 g/dL (ref 30.0–36.0)
MCV: 93.8 fL (ref 80.0–100.0)
Platelets: 131 10*3/uL — ABNORMAL LOW (ref 150–400)
RBC: 3.08 MIL/uL — ABNORMAL LOW (ref 3.87–5.11)
RDW: 12.9 % (ref 11.5–15.5)
WBC: 8.3 10*3/uL (ref 4.0–10.5)
nRBC: 0 % (ref 0.0–0.2)

## 2021-01-07 LAB — SARS CORONAVIRUS 2 (TAT 6-24 HRS): SARS Coronavirus 2: NEGATIVE

## 2021-01-07 MED ORDER — POTASSIUM CHLORIDE CRYS ER 20 MEQ PO TBCR
40.0000 meq | EXTENDED_RELEASE_TABLET | Freq: Once | ORAL | Status: AC
  Administered 2021-01-07: 40 meq via ORAL
  Filled 2021-01-07: qty 2

## 2021-01-07 MED ORDER — INSULIN ASPART 100 UNIT/ML ~~LOC~~ SOLN
5.0000 [IU] | Freq: Three times a day (TID) | SUBCUTANEOUS | Status: DC
  Administered 2021-01-07 – 2021-01-09 (×5): 5 [IU] via SUBCUTANEOUS
  Filled 2021-01-07 (×5): qty 1

## 2021-01-07 MED ORDER — INSULIN DETEMIR 100 UNIT/ML ~~LOC~~ SOLN
15.0000 [IU] | Freq: Two times a day (BID) | SUBCUTANEOUS | Status: DC
  Administered 2021-01-07 – 2021-01-09 (×4): 15 [IU] via SUBCUTANEOUS
  Filled 2021-01-07 (×5): qty 0.15

## 2021-01-07 NOTE — Plan of Care (Addendum)
Pt alert and oriented, on room air , afebrile. She reported pain multiple times this shift and is on the clock for pain medication. BG monitored this shift with multiple hypoglycemia noted (see results). Pt is able to tolerate PO. PO 240cc Apple juice given on each occasion. Pt denied any symptoms except for leg cramps. Provider notified. Falls precautions remained in place, call bell within reach Problem: Coping: Goal: Level of anxiety will decrease Outcome: Progressing   Problem: Pain Managment: Goal: General experience of comfort will improve Outcome: Progressing   Problem: Skin Integrity: Goal: Risk for impaired skin integrity will decrease Outcome: Progressing

## 2021-01-07 NOTE — Progress Notes (Signed)
Inpatient Diabetes Program Recommendations  AACE/ADA: New Consensus Statement on Inpatient Glycemic Control (2015)  Target Ranges:  Prepandial:   less than 140 mg/dL      Peak postprandial:   less than 180 mg/dL (1-2 hours)      Critically ill patients:  140 - 180 mg/dL   Lab Results  Component Value Date   GLUCAP 105 (H) 01/07/2021   HGBA1C 10.0 (H) 01/02/2021    Review of Glycemic Control Results for Andrea Stewart, Andrea Stewart (MRN [id number]) as of 01/07/2021 10:45  Ref. Range 01/06/2021 04:38 01/06/2021 08:22 01/06/2021 14:37 01/06/2021 16:51 01/06/2021 19:55 01/06/2021 20:26 01/06/2021 23:45 01/07/2021 00:27 01/07/2021 03:58 01/07/2021 04:58 01/07/2021 08:13  Glucose-Capillary Latest Ref Range: 70 - 99 mg/dL 244 (H) 224 (H) 74 128 (H) 65 (L) 92 68 (L) 114 (H) 59 (L) 109 (H) 105 (H)  Home DM Meds: NPH Insulin 20 units AM/ 30 units PM Humalog 2-6 units TID per SSI  CurrentOrders: Levemir 18unitsBID  Novolog 0-9 units AC + HS                            Novolog 6 units TID with meals  Inpatient Diabetes Program Recommendations:   Please consider reducing Levemir back to 15 units bid and reduce Novolog meal coverage to 5 units tid with meals.   Thanks,  Adah Perl, RN, BC-ADM Inpatient Diabetes Coordinator Pager [phone] (8a-5p)

## 2021-01-07 NOTE — Progress Notes (Signed)
[place], [name] 01/07/21  Subjective:   Andrea Stewart is a 75 y.o. 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 She states she was getting out of shower and fell on the carpet.   She is admitted with a  Left Hip Fracture.  Patient seen laying in bed PT at bedside waiting to work with patient Alert and oriented Able to tolerate meals Denies nausea Denies shortness of breath  Patient seen later during dialysis   HEMODIALYSIS FLOWSHEET:  Blood Flow Rate (mL/min): 0 mL/min Arterial Pressure (mmHg): -120 mmHg Venous Pressure (mmHg): 100 mmHg Transmembrane Pressure (mmHg): 20 mmHg Ultrafiltration Rate (mL/min): 500 mL/min Dialysate Flow Rate (mL/min): 300 ml/min Conductivity: Machine : 13.7 Conductivity: Machine : 13.7 Dialysis Fluid Bolus: Normal Saline Bolus Amount (mL): 300 mL  States she was placed in a chair for dialysis but returned to the bed once transport arrived.   Objective:  Vital signs in last 24 hours:  Temp:  [97.6 F (36.4 C)-98.6 F (37 C)] 97.6 F (36.4 C) (02/23 0812) Pulse Rate:  [61-74] 62 (02/23 0812) Resp:  [16-18] 16 (02/23 0812) BP: (112-160)/(48-92) 144/92 (02/23 0812) SpO2:  [91 %-98 %] 97 % (02/23 0812)  Weight change:  Filed Weights   01/04/21 0500 01/05/21 0500 01/06/21 0620  Weight: 86.4 kg 86.8 kg 87.3 kg    Intake/Output:    Intake/Output Summary (Last 24 hours) at 01/07/2021 1019 Last data filed at 01/07/2021 0943 Gross per 24 hour  Intake 240 ml  Output 286 ml  Net -46 ml     Physical Exam: General: NAD, laying in bed  HEENT Normocephalic, moist oral mucosa  Pulm/lungs Clear  CVS/Heart regular  Abdomen:  Soft, non-tender  Extremities: No peripheral edema  Neurologic: Alert, oriented, able to answer questions  Skin: Left hip bandage  Access: PD catheter, RT IJ Permcath       Basic Metabolic Panel:  Recent Labs  Lab  01/01/21 0643 01/03/21 1250 01/05/21 1800 01/06/21 1100 01/06/21 1923 01/07/21 0445  NA 135  --  126* 127* 129* 128*  K 3.3*  --  3.0* 3.1* 3.1* 3.3*  CL 98  --  92* 91* 97* 95*  CO2 27  --  '27 27 25 24  '$ GLUCOSE 217*  --  155* 185* 70 102*  BUN 49*  --  60* 59* 41* 41*  CREATININE 6.14*  --  5.16* 4.90* 3.70* 3.66*  CALCIUM 8.6*  --  8.1* 8.1* 8.0* 8.0*  MG  --  1.7  --   --   --   --   PHOS  --   --  4.8*  5.2* 5.4* 4.1  --      CBC: Recent Labs  Lab 12/31/20 1839 01/01/21 0643 01/06/21 0628 01/06/21 1923 01/07/21 0445  WBC 9.2 8.3 7.2 8.6 8.3  NEUTROABS 6.8  --   --   --   --   HGB 13.1 11.9* 10.5* 9.4* 9.7*  HCT 40.3 36.9 31.6* 28.6* 28.9*  MCV 95.0 95.6 94.6 94.1 93.8  PLT 244 218 184 189 131*      Lab Results  Component Value Date   HEPBSAG NON REACTIVE 01/04/2021      Microbiology:  Recent Results (from the past 240 hour(s))  Resp Panel by RT-PCR (Flu A&B, Covid) Nasopharyngeal Swab     Status: None   Collection Time: 12/31/20  6:39 PM   Specimen: Nasopharyngeal Swab;  Nasopharyngeal(NP) swabs in vial transport medium  Result Value Ref Range Status   SARS Coronavirus 2 by RT PCR NEGATIVE NEGATIVE Final    Comment: (NOTE) SARS-CoV-2 target nucleic acids are NOT DETECTED.  The SARS-CoV-2 RNA is generally detectable in upper respiratory specimens during the acute phase of infection. The lowest concentration of SARS-CoV-2 viral copies this assay can detect is 138 copies/mL. A negative result does not preclude SARS-Cov-2 infection and should not be used as the sole basis for treatment or other patient management decisions. A negative result may occur with  improper specimen collection/handling, submission of specimen other than nasopharyngeal swab, presence of viral mutation(s) within the areas targeted by this assay, and inadequate number of viral copies(<138 copies/mL). A negative result must be combined with clinical observations, patient history,  and epidemiological information. The expected result is Negative.  Fact Sheet for Patients:  EntrepreneurPulse.com.au  Fact Sheet for Healthcare Providers:  IncredibleEmployment.be  This test is no t yet approved or cleared by the [place] FDA and  has been authorized for detection and/or diagnosis of SARS-CoV-2 by FDA under an Emergency Use Authorization (EUA). This EUA will remain  in effect (meaning this test can be used) for the duration of the COVID-19 declaration under Section 564(b)(1) of the Act, 21 U.S.C.section 360bbb-3(b)(1), unless the authorization is terminated  or revoked sooner.       Influenza A by PCR NEGATIVE NEGATIVE Final   Influenza B by PCR NEGATIVE NEGATIVE Final    Comment: (NOTE) The Xpert Xpress SARS-CoV-2/FLU/RSV plus assay is intended as an aid in the diagnosis of influenza from Nasopharyngeal swab specimens and should not be used as a sole basis for treatment. Nasal washings and aspirates are unacceptable for Xpert Xpress SARS-CoV-2/FLU/RSV testing.  Fact Sheet for Patients: EntrepreneurPulse.com.au  Fact Sheet for Healthcare Providers: IncredibleEmployment.be  This test is not yet approved or cleared by the [place] FDA and has been authorized for detection and/or diagnosis of SARS-CoV-2 by FDA under an Emergency Use Authorization (EUA). This EUA will remain in effect (meaning this test can be used) for the duration of the COVID-19 declaration under Section 564(b)(1) of the Act, 21 U.S.C. section 360bbb-3(b)(1), unless the authorization is terminated or revoked.  Performed at [facility], [place], [place] [address]   SARS CORONAVIRUS 2 (TAT 6-24 HRS) Nasopharyngeal Nasopharyngeal Swab     Status: None   Collection Time: 01/06/21  6:20 PM   Specimen: Nasopharyngeal Swab  Result Value Ref Range Status   SARS Coronavirus 2 NEGATIVE  NEGATIVE Final    Comment: (NOTE) SARS-CoV-2 target nucleic acids are NOT DETECTED.  The SARS-CoV-2 RNA is generally detectable in upper and lower respiratory specimens during the acute phase of infection. Negative results do not preclude SARS-CoV-2 infection, do not rule out co-infections with other pathogens, and should not be used as the sole basis for treatment or other patient management decisions. Negative results must be combined with clinical observations, patient history, and epidemiological information. The expected result is Negative.  Fact Sheet for Patients: SugarRoll.be  Fact Sheet for Healthcare Providers: https://www.woods-mathews.com/  This test is not yet approved or cleared by the [place] FDA and  has been authorized for detection and/or diagnosis of SARS-CoV-2 by FDA under an Emergency Use Authorization (EUA). This EUA will remain  in effect (meaning this test can be used) for the duration of the COVID-19 declaration under Se ction 564(b)(1) of the Act, 21 U.S.C. section 360bbb-3(b)(1), unless the authorization  is terminated or revoked sooner.  Performed at [facility], [place] [address] [place], [place] [address]     Coagulation Studies: No results for input(s): LABPROT, INR in the last 72 hours.  Urinalysis: No results for input(s): COLORURINE, LABSPEC, PHURINE, GLUCOSEU, HGBUR, BILIRUBINUR, KETONESUR, PROTEINUR, UROBILINOGEN, NITRITE, LEUKOCYTESUR in the last 72 hours.  Invalid input(s): APPERANCEUR    Imaging: PERIPHERAL VASCULAR CATHETERIZATION  Result Date: 01/05/2021 See op note    Medications:   . sodium chloride Stopped (01/01/21 1614)  . sodium chloride    . sodium chloride     . (feeding supplement) PROSource Plus  30 mL Oral Daily  . aspirin EC  81 mg Oral BID  . atorvastatin  20 mg Oral Daily  . carvedilol  25 mg Oral BID  . Chlorhexidine Gluconate Cloth  6 each Topical Q0600   . feeding supplement (NEPRO CARB STEADY)  237 mL Oral Q24H  . furosemide  80 mg Oral Daily  . heparin injection (subcutaneous)  5,000 Units Subcutaneous Q8H  . insulin aspart  0-5 Units Subcutaneous QHS  . insulin aspart  0-9 Units Subcutaneous TID WC  . insulin aspart  6 Units Subcutaneous TID WC  . insulin detemir  18 Units Subcutaneous BID AC & HS  . metoCLOPramide  5 mg Oral 5 X Daily  . multivitamin  1 tablet Oral Daily  . nicotine  21 mg Transdermal Daily  . pantoprazole  40 mg Oral BID  . polyethylene glycol  17 g Oral Daily  . senna-docusate  2 tablet Oral BID   sodium chloride, sodium chloride, acetaminophen, ALPRAZolam, alteplase, heparin, HYDROcodone-acetaminophen, hydrOXYzine, lidocaine (PF), lidocaine-prilocaine, metoCLOPramide **OR** metoCLOPramide (REGLAN) injection, ondansetron **OR** ondansetron (ZOFRAN) IV, ondansetron (ZOFRAN) IV, oxyCODONE, pentafluoroprop-tetrafluoroeth, traZODone  Assessment/ Plan:  75 y.o. 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 was admitted on 12/31/2020 for Hip fracture ([place]) [S72.009A]  Active Problems:   Diabetes mellitus type 2 with complications, uncontrolled ([place])   Essential hypertension   Coronary atherosclerosis   Single kidney   COPD (chronic obstructive pulmonary disease) with chronic bronchitis (HCC)   CKD (chronic kidney disease), stage III ([place])   Morbid obesity ([place])   Atherosclerosis of native arteries of extremity with intermittent claudication (HCC)   PAD (peripheral artery disease) ([place])   History of total knee replacement, left   History of total knee replacement, right   Hip fracture ([place])  Hip fracture ([place]) [S72.009A] Fall, initial encounter [W19.XXXA] Closed 2-part intertrochanteric fracture of left femur, initial encounter ([place]) [S72.142A]     #. ESRD on peritoneal dialysis -Currently receiving dialysis -UF Goal of 0.5-1L -Outpatient treatment  arranged at [address], TTS -Plan to return to PD once discharged from rehab  #. Anemia of Chronic kidney disease  Lab Results  Component Value Date   HGB 9.7 (L) 01/07/2021   -stable now -Will continue to monitor  #. Secondary hyperparathyroidism   No results found for: PTH Lab Results  Component Value Date   PHOS 4.1 01/06/2021   Monitor calcium and phos level during this admission   #. Diabetes type 2 with CKD Hemoglobin A1C (no units)  Date Value  07/09/2019 8.5   Hgb A1c MFr Bld (%)  Date Value  01/02/2021 10.0 (H)  -Glucose levels stablized -SSI and Levemir ordered for management  # Left Hip Fracture -Left Intramedullary femoral nail placement on 01/01/21 -PT/OT ordered -SNF arranged at [facility] -appreciate surgical  involvement     LOS: [place], NP 2/23/202210:19 [place], [place]

## 2021-01-07 NOTE — Progress Notes (Signed)
PROGRESS NOTE    LORAN RIGOLI   [id number]  DOB: 1946-11-13  PCP: Rusty Aus, MD    DOA: 12/31/2020 LOS: 76   Brief Narrative   75 year old female with a known history of ESRD on peritoneal dialysis, diabetes, hypertension, coronary artery disease, PAD, COPD is admitted for left hip fracture status post fall.  2/17: S/p intramedullary nail repair 2/18-2/20: Working with therapy and hope to go home but remains weak and will need SNF.  PD is switched to hemodialysis for SNF placement. 2/21-right IJ cuffed tunneled hemodialysis catheter placed 2/22: Patient has SNF bed at [facility] but will need to prove that she is able to sit up in a chair for hemodialysis for at least 4 hours which she has not been able to do so far.    2/23 - had dialysis but chair was not available.  Have asked for pt to remain in recliner chair remainder of afternoon with hopeful discharge to SNF tomorrow.   Assessment & Plan   Active Problems:   Diabetes mellitus type 2 with complications, uncontrolled (HCC)   Essential hypertension   Coronary atherosclerosis   Single kidney   COPD (chronic obstructive pulmonary disease) with chronic bronchitis (HCC)   CKD (chronic kidney disease), stage III (HCC)   Morbid obesity (HCC)   Atherosclerosis of native arteries of extremity with intermittent claudication (HCC)   PAD (peripheral artery disease) ([place])   History of total knee replacement, left   History of total knee replacement, right   Hip fracture (HCC)   Left hip fracture -due to mechanical fall.  Status post intramedullary nail repair on 2/17. --Pain control and DVT prophylaxis per Ortho --PT and OT recommending SNF, patient agreeable  ESRD -was on peritoneal dialysis.  Transitioned to hemodialysis this admission for SNF placement as patient recovers from her hip fracture.  Plan to return to PD once out of SNF. --Needs to sit in a chair for 4 hours and tolerate prior to  discharge  Chronic diastolic CHF -currently well compensated.  Monitor  Hypokalemia -replaced.  Monitor BMP and replace as needed.  Type 2 diabetes -continue Levemir, NovoLog.  Appreciate diabetes coordinator recommendations.  Generalized anxiety -continue Xanax at bedtime and as needed hydroxyzine.  Hyperlipidemia -continue Lipitor  Essential hypertension -stable.  Continue Coreg  GERD -continue PPI  Gastroparesis -continue Reglan  Constipation -continue bowel regimen, suppository or enemas as needed.   Obesity: Body mass index is 30.14 kg/m.  Complicates overall care and prognosis.  DVT prophylaxis: SCDs Start: 01/01/21 1734 heparin injection 5,000 Units Start: 12/31/20 2245 SCDs Start: 12/31/20 2146   Diet:  Diet Orders (From admission, onward)    Start     Ordered   01/05/21 1406  Diet Carb Modified Fluid consistency: Thin; Room service appropriate? Yes  Diet effective now       Question Answer Comment  Diet-HS Snack? Nothing   Calorie Level Medium 1600-2000   Fluid consistency: Thin   Room service appropriate? Yes      01/05/21 1406            Code Status: Full Code    Subjective 01/07/21    Patient seen today at bedside during dialysis.  Dialysis nurse states there was no chair available as the patient is in the hospital bed.  Patient reports she is doing fair.  She has no acute complaints.   Disposition Plan & Communication   Status is: Inpatient  Remains inpatient appropriate because:Patient must be able  to tolerate sitting in a chair for 4 hours and has yet to prove she can tolerate this in order to obtain dialysis once she goes to SNF.     Dispo:  Patient From: Home  Planned Disposition: [place]  Expected discharge date: 01/07/2021  Medically stable for discharge: No      Consults, Procedures, Significant Events   Consultants:   Orthopedics  Nephrology  Procedures:   2/17 intramedullary nail fixation of left femur  fracture  Antimicrobials:  Anti-infectives (From admission, onward)   Start     Dose/Rate Route Frequency Ordered Stop   01/06/21 0000  clindamycin (CLEOCIN) IVPB 300 mg  Status:  Discontinued       Note to Pharmacy: To be given in specials   300 mg 100 mL/hr over 30 Minutes Intravenous  Once 01/05/21 1025 01/05/21 1210   01/05/21 1104  clindamycin (CLEOCIN) 300 MG/50ML IVPB       Note to Pharmacy: Carlynn Spry   : cabinet override      01/05/21 1104 01/05/21 1138   01/01/21 1455  clindamycin (CLEOCIN) 600 MG/50ML IVPB       Note to Pharmacy: Lyman Bishop   : cabinet override      01/01/21 1455 01/01/21 1528   12/31/20 2308  clindamycin (CLEOCIN) IVPB 600 mg        600 mg 100 mL/hr over 30 Minutes Intravenous 30 min pre-op 12/31/20 2311 01/01/21 1554        Micro    Objective   Vitals:   01/07/21 1230 01/07/21 1245 01/07/21 1258 01/07/21 1408  BP: 119/63 129/66  124/71  Pulse: (!) 56 63  67  Resp: (!) 34 (!) 24 (!) 32 20  Temp:    98.6 F (37 C)  TempSrc:    Oral  SpO2:      Weight:      Height:        Intake/Output Summary (Last 24 hours) at 01/07/2021 1440 Last data filed at 01/07/2021 1245 Gross per 24 hour  Intake 240 ml  Output 1000 ml  Net -760 ml   Filed Weights   01/04/21 0500 01/05/21 0500 01/06/21 0620  Weight: 86.4 kg 86.8 kg 87.3 kg    Physical Exam:  General exam: awake, appears drowsy, no acute distress, obese HEENT: moist mucus membranes, hearing grossly normal  Respiratory system: CTAB anteriorly, normal respiratory effort. Cardiovascular system: normal S1/S2, RRR, no pedal edema, right IJ PermCath in place.   Central nervous system: A&O x3. no gross focal neurologic deficits, normal speech Extremities: moves all, no edema, normal tone Psychiatry: normal mood, congruent affect, judgement and insight appear normal  Labs   Data Reviewed: I have personally reviewed following labs and imaging studies  CBC: Recent Labs  Lab  12/31/20 1839 01/01/21 0643 01/06/21 0628 01/06/21 1923 01/07/21 0445  WBC 9.2 8.3 7.2 8.6 8.3  NEUTROABS 6.8  --   --   --   --   HGB 13.1 11.9* 10.5* 9.4* 9.7*  HCT 40.3 36.9 31.6* 28.6* 28.9*  MCV 95.0 95.6 94.6 94.1 93.8  PLT 244 218 184 189 [id number]*   Basic Metabolic Panel: Recent Labs  Lab 01/01/21 0643 01/03/21 1250 01/05/21 1800 01/06/21 1100 01/06/21 1923 01/07/21 0445  NA 135  --  126* 127* 129* 128*  K 3.3*  --  3.0* 3.1* 3.1* 3.3*  CL 98  --  92* 91* 97* 95*  CO2 27  --  '27 27 25 '$ 24  GLUCOSE 217*  --  155* 185* 70 102*  BUN 49*  --  60* 59* 41* 41*  CREATININE 6.14*  --  5.16* 4.90* 3.70* 3.66*  CALCIUM 8.6*  --  8.1* 8.1* 8.0* 8.0*  MG  --  1.7  --   --   --   --   PHOS  --   --  4.8*  5.2* 5.4* 4.1  --    GFR: Estimated Creatinine Clearance: 15.1 mL/min (A) (by C-G formula based on SCr of 3.66 mg/dL (H)). Liver Function Tests: Recent Labs  Lab 12/31/20 1839 01/05/21 1800 01/06/21 1100 01/06/21 1923  AST 24  --   --   --   ALT 16  --   --   --   ALKPHOS 41  --   --   --   BILITOT 0.5  --   --   --   PROT 6.5  --   --   --   ALBUMIN 2.5* 1.9* 1.9* 2.0*   No results for input(s): LIPASE, AMYLASE in the last 168 hours. No results for input(s): AMMONIA in the last 168 hours. Coagulation Profile: No results for input(s): INR, PROTIME in the last 168 hours. Cardiac Enzymes: No results for input(s): CKTOTAL, CKMB, CKMBINDEX, TROPONINI in the last 168 hours. BNP (last 3 results) No results for input(s): PROBNP in the last 8760 hours. HbA1C: No results for input(s): HGBA1C in the last 72 hours. CBG: Recent Labs  Lab 01/07/21 0027 01/07/21 0358 01/07/21 0458 01/07/21 0813 01/07/21 1355  GLUCAP 114* 59* 109* 105* 93   Lipid Profile: No results for input(s): CHOL, HDL, LDLCALC, TRIG, CHOLHDL, LDLDIRECT in the last 72 hours. Thyroid Function Tests: No results for input(s): TSH, T4TOTAL, FREET4, T3FREE, THYROIDAB in the last 72 hours. Anemia  Panel: No results for input(s): VITAMINB12, FOLATE, FERRITIN, TIBC, IRON, RETICCTPCT in the last 72 hours. Sepsis Labs: No results for input(s): PROCALCITON, LATICACIDVEN in the last 168 hours.  Recent Results (from the past 240 hour(s))  Resp Panel by RT-PCR (Flu A&B, Covid) Nasopharyngeal Swab     Status: None   Collection Time: 12/31/20  6:39 PM   Specimen: Nasopharyngeal Swab; Nasopharyngeal(NP) swabs in vial transport medium  Result Value Ref Range Status   SARS Coronavirus 2 by RT PCR NEGATIVE NEGATIVE Final    Comment: (NOTE) SARS-CoV-2 target nucleic acids are NOT DETECTED.  The SARS-CoV-2 RNA is generally detectable in upper respiratory specimens during the acute phase of infection. The lowest concentration of SARS-CoV-2 viral copies this assay can detect is 138 copies/mL. A negative result does not preclude SARS-Cov-2 infection and should not be used as the sole basis for treatment or other patient management decisions. A negative result may occur with  improper specimen collection/handling, submission of specimen other than nasopharyngeal swab, presence of viral mutation(s) within the areas targeted by this assay, and inadequate number of viral copies(<138 copies/mL). A negative result must be combined with clinical observations, patient history, and epidemiological information. The expected result is Negative.  Fact Sheet for Patients:  EntrepreneurPulse.com.au  Fact Sheet for Healthcare Providers:  IncredibleEmployment.be  This test is no t yet approved or cleared by the [place] FDA and  has been authorized for detection and/or diagnosis of SARS-CoV-2 by FDA under an Emergency Use Authorization (EUA). This EUA will remain  in effect (meaning this test can be used) for the duration of the COVID-19 declaration under Section 564(b)(1) of the Act, 21 U.S.C.section 360bbb-3(b)(1), unless the authorization  is terminated  or  revoked sooner.       Influenza A by PCR NEGATIVE NEGATIVE Final   Influenza B by PCR NEGATIVE NEGATIVE Final    Comment: (NOTE) The Xpert Xpress SARS-CoV-2/FLU/RSV plus assay is intended as an aid in the diagnosis of influenza from Nasopharyngeal swab specimens and should not be used as a sole basis for treatment. Nasal washings and aspirates are unacceptable for Xpert Xpress SARS-CoV-2/FLU/RSV testing.  Fact Sheet for Patients: EntrepreneurPulse.com.au  Fact Sheet for Healthcare Providers: IncredibleEmployment.be  This test is not yet approved or cleared by the [place] FDA and has been authorized for detection and/or diagnosis of SARS-CoV-2 by FDA under an Emergency Use Authorization (EUA). This EUA will remain in effect (meaning this test can be used) for the duration of the COVID-19 declaration under Section 564(b)(1) of the Act, 21 U.S.C. section 360bbb-3(b)(1), unless the authorization is terminated or revoked.  Performed at [facility], [place], [place] [address]   SARS CORONAVIRUS 2 (TAT 6-24 HRS) Nasopharyngeal Nasopharyngeal Swab     Status: None   Collection Time: 01/06/21  6:20 PM   Specimen: Nasopharyngeal Swab  Result Value Ref Range Status   SARS Coronavirus 2 NEGATIVE NEGATIVE Final    Comment: (NOTE) SARS-CoV-2 target nucleic acids are NOT DETECTED.  The SARS-CoV-2 RNA is generally detectable in upper and lower respiratory specimens during the acute phase of infection. Negative results do not preclude SARS-CoV-2 infection, do not rule out co-infections with other pathogens, and should not be used as the sole basis for treatment or other patient management decisions. Negative results must be combined with clinical observations, patient history, and epidemiological information. The expected result is Negative.  Fact Sheet for Patients: SugarRoll.be  Fact  Sheet for Healthcare Providers: https://www.woods-mathews.com/  This test is not yet approved or cleared by the [place] FDA and  has been authorized for detection and/or diagnosis of SARS-CoV-2 by FDA under an Emergency Use Authorization (EUA). This EUA will remain  in effect (meaning this test can be used) for the duration of the COVID-19 declaration under Se ction 564(b)(1) of the Act, 21 U.S.C. section 360bbb-3(b)(1), unless the authorization is terminated or revoked sooner.  Performed at [facility], [place] [address] [place], [place] [address]       Imaging Studies   No results found.   Medications   Scheduled Meds: . (feeding supplement) PROSource Plus  30 mL Oral Daily  . aspirin EC  81 mg Oral BID  . atorvastatin  20 mg Oral Daily  . carvedilol  25 mg Oral BID  . Chlorhexidine Gluconate Cloth  6 each Topical Q0600  . feeding supplement (NEPRO CARB STEADY)  237 mL Oral Q24H  . furosemide  80 mg Oral Daily  . heparin injection (subcutaneous)  5,000 Units Subcutaneous Q8H  . insulin aspart  0-5 Units Subcutaneous QHS  . insulin aspart  0-9 Units Subcutaneous TID WC  . insulin aspart  5 Units Subcutaneous TID WC  . insulin detemir  15 Units Subcutaneous BID AC & HS  . metoCLOPramide  5 mg Oral 5 X Daily  . multivitamin  1 tablet Oral Daily  . nicotine  21 mg Transdermal Daily  . pantoprazole  40 mg Oral BID  . polyethylene glycol  17 g Oral Daily  . senna-docusate  2 tablet Oral BID   Continuous Infusions: . sodium chloride Stopped (01/01/21 1614)       LOS: 7 days  Time spent: 30 minutes with greater than 50% spent at bedside and in coordination of care    Ezekiel Slocumb, DO [facility]  01/07/2021, 2:40 PM      If 7PM-7AM, please contact night-coverage. How to contact the [facility] Attending or Consulting provider [place] or covering provider during after hours [place], for this patient?    1. Check the care team in  [facility] and look for a) attending/consulting TRH provider listed and b) the [facility] team listed 2. Log into [email or web address] and use [place] universal password to access. If you do not have the password, please contact the hospital operator. 3. Locate the [facility] provider you are looking for under [facility] and page to a number that you can be directly reached. 4. If you still have difficulty reaching the provider, please page the [employer] (Director on Call) for the Hospitalists listed on amion for assistance.

## 2021-01-07 NOTE — Progress Notes (Signed)
Physical Therapy Treatment Patient Details Name: Andrea Stewart MRN: [id number] DOB: June 11, 1946 Today's Date: 01/07/2021    History of Present Illness Per MD notes: Pt is a 75 y.o. female with history of ESRD on peritoneal dialysis, poorly controlled type 2 diabetes on insulin, hypertension, CAD, PAD, COPD, who presents after a fall.  Pt diagnosed with L hip fracture and is s/p left femoral IM nail.    PT Comments    Pt was pleasant and motivated to participate and made progress towards goals this session.  Pt was able to stand from an elevated surface without physical assistance.  Pt took several steps at the EOB and then from the bed to the chair and was able to clear the surface of the floor while advancing the RLE on 2-3 occasions although only with heavy lean on the RW and with minimal LLE SLS time.  Pt will benefit from PT services in a SNF setting upon discharge to safely address deficits listed in patient problem list for decreased caregiver assistance and eventual return to PLOF.     Follow Up Recommendations  SNF;Supervision for mobility/OOB     Equipment Recommendations  None recommended by PT    Recommendations for Other Services       Precautions / Restrictions Precautions Precautions: Fall Restrictions Weight Bearing Restrictions: Yes LLE Weight Bearing: Weight bearing as tolerated Other Position/Activity Restrictions: Peritoneal dialysis port RLQ and R IJ permcath    Mobility  Bed Mobility Overal bed mobility: Needs Assistance Bed Mobility: Rolling;Sidelying to Sit Rolling: Min assist Sidelying to sit: Min assist       General bed mobility comments: Pt required min A to come to sitting at the EOB with cues for sequencing    Transfers Overall transfer level: Needs assistance Equipment used: Rolling walker (2 wheeled) Transfers: Sit to/from Stand Sit to Stand: From elevated surface;Min guard         General transfer comment: Min verbal cues for  sequencing with improved eccentric and concentric control  Ambulation/Gait Ambulation/Gait assistance: Min guard Gait Distance (Feet): 2 Feet Assistive device: Rolling walker (2 wheeled) Gait Pattern/deviations: Step-to pattern;Trunk flexed;Decreased stance time - left;Antalgic;Shuffle Gait velocity: decreased   General Gait Details: Pt able to take 4-5 very small, shuffling steps at the EOB and from bed to chair.   Stairs             Wheelchair Mobility    Modified Rankin (Stroke Patients Only)       Balance Overall balance assessment: Needs assistance   Sitting balance-Leahy Scale: Normal     Standing balance support: Bilateral upper extremity supported;During functional activity Standing balance-Leahy Scale: Fair Standing balance comment: Heavy lean on the RW for support                            Cognition Arousal/Alertness: Awake/alert Behavior During Therapy: WFL for tasks assessed/performed Overall Cognitive Status: Within Functional Limits for tasks assessed                                        Exercises Total Joint Exercises Ankle Circles/Pumps: AROM;Strengthening;Both;10 reps Quad Sets: 10 reps;Both;Strengthening Gluteal Sets: Strengthening;Both;10 reps Heel Slides: AAROM;Strengthening;Both;5 reps;AROM Long Arc Quad: AROM;Strengthening;Both;10 reps Knee Flexion: 10 reps;Both;Strengthening;AROM    General Comments        Pertinent Vitals/Pain Pain Assessment: 0-10 Pain Score: 3  Pain Location: L hip Pain Descriptors / Indicators: Aching;Sore Pain Intervention(s): Premedicated before session;Monitored during session    Home Living                      Prior Function            PT Goals (current goals can now be found in the care plan section) Progress towards PT goals: Progressing toward goals    Frequency    BID      PT Plan Current plan remains appropriate    Co-evaluation               AM-PAC PT "6 Clicks" Mobility   Outcome Measure  Help needed turning from your back to your side while in a flat bed without using bedrails?: A Little Help needed moving from lying on your back to sitting on the side of a flat bed without using bedrails?: A Little Help needed moving to and from a bed to a chair (including a wheelchair)?: A Lot Help needed standing up from a chair using your arms (e.g., wheelchair or bedside chair)?: A Little Help needed to walk in hospital room?: Total Help needed climbing 3-5 steps with a railing? : Total 6 Click Score: 13    End of Session Equipment Utilized During Treatment: Gait belt Activity Tolerance: Patient tolerated treatment well Patient left: with family/visitor present;in chair;with call bell/phone within reach;with chair alarm set Nurse Communication: Mobility status;Weight bearing status PT Visit Diagnosis: Other abnormalities of gait and mobility (R26.89);Muscle weakness (generalized) (M62.81);Pain Pain - Right/Left: Left Pain - part of body: Hip     Time: [id number] PT Time Calculation (min) (ACUTE ONLY): 24 min  Charges:  $Gait Training: 8-22 mins $Therapeutic Exercise: 8-22 mins                     D. Scott Tanner PT, DPT 01/07/21, 12:02 PM

## 2021-01-07 NOTE — Progress Notes (Signed)
Physical Therapy Treatment Patient Details Name: Andrea Stewart MRN: [id number] DOB: 04-21-1946 Today's Date: 01/07/2021    History of Present Illness Per MD notes: Pt is a 75 y.o. female with history of ESRD on peritoneal dialysis, poorly controlled type 2 diabetes on insulin, hypertension, CAD, PAD, COPD, who presents after a fall.  Pt diagnosed with L hip fracture and is s/p left femoral IM nail.    PT Comments    Pt was pleasant and motivated to participate during the session but was somewhat limited by fatigue s/p HD.  Pt put forth good effort with below therex and was able to stand from a recliner without physical assistance but was unable to take any steps this session secondary to fatigue.  Max standing tolerance ranged from 30 to 45 sec.  Pt will benefit from PT services in a SNF setting upon discharge to safely address deficits listed in patient problem list for decreased caregiver assistance and eventual return to PLOF.     Follow Up Recommendations  SNF;Supervision for mobility/OOB     Equipment Recommendations  None recommended by PT    Recommendations for Other Services       Precautions / Restrictions Precautions Precautions: Fall Restrictions Weight Bearing Restrictions: Yes LLE Weight Bearing: Weight bearing as tolerated Other Position/Activity Restrictions: Peritoneal dialysis port RLQ and R IJ permcath    Mobility  Bed Mobility Overal bed mobility: Needs Assistance Bed Mobility: Rolling;Sidelying to Sit Rolling: Min assist Sidelying to sit: Min assist       General bed mobility comments: NT, pt in recliner    Transfers Overall transfer level: Needs assistance Equipment used: Rolling walker (2 wheeled) Transfers: Sit to/from Stand Sit to Stand: From elevated surface;Min guard         General transfer comment: Sit to/from stand from a recliner x 3 with max standing tolerance30-45 sec  Ambulation/Gait Ambulation/Gait assistance: Min  guard Gait Distance (Feet): 2 Feet Assistive device: Rolling walker (2 wheeled) Gait Pattern/deviations: Step-to pattern;Trunk flexed;Decreased stance time - left;Antalgic;Shuffle Gait velocity: decreased   General Gait Details: Pt unable to take any steps this session secondary to fatigue in standing   Stairs             Wheelchair Mobility    Modified Rankin (Stroke Patients Only)       Balance Overall balance assessment: Needs assistance   Sitting balance-Leahy Scale: Normal     Standing balance support: Bilateral upper extremity supported;During functional activity Standing balance-Leahy Scale: Fair Standing balance comment: Heavy lean on the RW for support                            Cognition Arousal/Alertness: Lethargic Behavior During Therapy: WFL for tasks assessed/performed Overall Cognitive Status: Within Functional Limits for tasks assessed                                        Exercises Total Joint Exercises Ankle Circles/Pumps: AROM;Strengthening;Both;10 reps;5 reps Quad Sets: 10 reps;Both;Strengthening;5 reps Gluteal Sets: Strengthening;Both;10 reps;5 reps Heel Slides: AAROM;Strengthening;Both;5 reps;AROM Hip ABduction/ADduction: Strengthening;Both;AAROM;AROM;5 reps Straight Leg Raises: AROM;AAROM;Strengthening;Both;5 reps Long Arc Quad: AROM;Strengthening;Both;10 reps;5 reps Knee Flexion: 10 reps;Both;Strengthening;AROM;5 reps    General Comments        Pertinent Vitals/Pain Pain Assessment: 0-10 Pain Score: 3  Pain Location: L hip Pain Descriptors / Indicators: Aching Pain Intervention(s): Premedicated  before session;Monitored during session    Home Living                      Prior Function            PT Goals (current goals can now be found in the care plan section) Progress towards PT goals: Progressing toward goals    Frequency    BID      PT Plan Current plan remains appropriate     Co-evaluation              AM-PAC PT "6 Clicks" Mobility   Outcome Measure  Help needed turning from your back to your side while in a flat bed without using bedrails?: A Little Help needed moving from lying on your back to sitting on the side of a flat bed without using bedrails?: A Little Help needed moving to and from a bed to a chair (including a wheelchair)?: A Lot Help needed standing up from a chair using your arms (e.g., wheelchair or bedside chair)?: A Little Help needed to walk in hospital room?: Total Help needed climbing 3-5 steps with a railing? : Total 6 Click Score: 13    End of Session Equipment Utilized During Treatment: Gait belt Activity Tolerance: Patient tolerated treatment well Patient left: with family/visitor present;in chair;with call bell/phone within reach;with chair alarm set Nurse Communication: Mobility status;Weight bearing status PT Visit Diagnosis: Other abnormalities of gait and mobility (R26.89);Muscle weakness (generalized) (M62.81);Pain Pain - Right/Left: Left Pain - part of body: Hip     Time: 1425-1449 PT Time Calculation (min) (ACUTE ONLY): 24 min  Charges:  $Gait Training: 8-22 mins $Therapeutic Exercise: 8-22 mins $Therapeutic Activity: 8-22 mins                     D. Scott Eziah Negro PT, DPT 01/07/21, 2:59 PM

## 2021-01-08 LAB — GLUCOSE, CAPILLARY
Glucose-Capillary: 130 mg/dL — ABNORMAL HIGH (ref 70–99)
Glucose-Capillary: 143 mg/dL — ABNORMAL HIGH (ref 70–99)
Glucose-Capillary: 158 mg/dL — ABNORMAL HIGH (ref 70–99)
Glucose-Capillary: 207 mg/dL — ABNORMAL HIGH (ref 70–99)
Glucose-Capillary: 75 mg/dL (ref 70–99)
Glucose-Capillary: 97 mg/dL (ref 70–99)

## 2021-01-08 LAB — BASIC METABOLIC PANEL
Anion gap: 7 (ref 5–15)
BUN: 37 mg/dL — ABNORMAL HIGH (ref 8–23)
CO2: 25 mmol/L (ref 22–32)
Calcium: 8 mg/dL — ABNORMAL LOW (ref 8.9–10.3)
Chloride: 99 mmol/L (ref 98–111)
Creatinine, Ser: 3.4 mg/dL — ABNORMAL HIGH (ref 0.44–1.00)
GFR, Estimated: 14 mL/min — ABNORMAL LOW (ref >60)
Glucose, Bld: 128 mg/dL — ABNORMAL HIGH (ref 70–99)
Potassium: 3.6 mmol/L (ref 3.5–5.1)
Sodium: 131 mmol/L — ABNORMAL LOW (ref 135–145)

## 2021-01-08 NOTE — TOC Progression Note (Addendum)
Transition of Care [facility]) - Progression Note    Patient Details  Name: Andrea Stewart MRN: [id number] Date of Birth: 1946-11-09  Transition of Care [facility]) CM/SW [place], LCSW Phone Number: 01/08/2021, 10:28 AM  Clinical Narrative:   Damaris Schooner to Magda Paganini at [facility]. As long as patient sat in chair for HD, she will have a bed at [facility] 2/25. Asked RN to confirm patient sat in chair for HD. Updated MD and RN.  11:00- Per MD and Nephrology notes, patient was in bed for dialysis yesterday. Spoke with Dialysis Coordinator Estill Bamberg who reported that since patient has been sitting up in the room for extended time and can stand/pivot, she is ok to go to [facility] TTS. Per Nephrology, patient can DC to SNF when bed available (tomorrow). Patient can DC to SNF tomorrow and get dialysis Saturday as scheduled. CSW updated RN, MD, and Magda Paganini at [facility].  Expected Discharge Plan: [place] Barriers to Discharge: Continued Medical Work up  Expected Discharge Plan and Services Expected Discharge Plan: [place] arrangements for the past 2 months: Single Family Home                                       Social Determinants of Health (SDOH) Interventions    Readmission Risk Interventions No flowsheet data found.

## 2021-01-08 NOTE — Progress Notes (Signed)
[facility] stated that they are okay with patient starting backup HD on Saturday 2/26.

## 2021-01-08 NOTE — Progress Notes (Signed)
PT Cancellation Note  Patient Details Name: Andrea Stewart MRN: [id number] DOB: 17-May-1946   Cancelled Treatment:    Reason Eval/Treat Not Completed: Other (comment) Pt found sitting at EOB with spouse preparing to eat dinner.  Will attempt to see pt at a future date/time as medically appropriate.     Linus Salmons PT, DPT 01/08/21, 4:50 PM

## 2021-01-08 NOTE — Progress Notes (Signed)
[place], [name] 01/08/21  Subjective:   Andrea Stewart is a 75 y.o. 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 She states she was getting out of shower and fell on the carpet.   She is admitted with a  Left Hip Fracture.  Patient seen sitting in chair States she has been in the chair since 0630. Alert and oriented Able to tolerate meals Denies nausea Denies shortness of breath   Objective:  Vital signs in last 24 hours:  Temp:  [97.4 F (36.3 C)-98.6 F (37 C)] 98.6 F (37 C) (02/24 0748) Pulse Rate:  [51-73] 60 (02/24 0748) Resp:  [15-34] 17 (02/24 0748) BP: (88-151)/(52-72) 140/65 (02/24 0748) SpO2:  [85 %-97 %] 96 % (02/24 0748)  Weight change:  Filed Weights   01/04/21 0500 01/05/21 0500 01/06/21 0620  Weight: 86.4 kg 86.8 kg 87.3 kg    Intake/Output:    Intake/Output Summary (Last 24 hours) at 01/08/2021 1029 Last data filed at 01/08/2021 1010 Gross per 24 hour  Intake 120 ml  Output 1000 ml  Net -880 ml     Physical Exam: General: NAD, sitting in chair  HEENT Normocephalic, moist oral mucosa  Pulm/lungs Clear  CVS/Heart regular  Abdomen:  Soft, non-tender  Extremities: No peripheral edema  Neurologic: Alert, oriented, able to answer questions  Skin: Left hip bandage  Access: PD catheter, RT IJ Permcath       Basic Metabolic Panel:  Recent Labs  Lab 01/03/21 1250 01/05/21 1800 01/05/21 1800 01/06/21 1100 01/06/21 1923 01/07/21 0445 01/08/21 0509  NA  --  126*  --  127* 129* 128* 131*  K  --  3.0*  --  3.1* 3.1* 3.3* 3.6  CL  --  92*  --  91* 97* 95* 99  CO2  --  27  --  '27 25 24 25  '$ GLUCOSE  --  155*  --  185* 70 102* 128*  BUN  --  60*  --  59* 41* 41* 37*  CREATININE  --  5.16*  --  4.90* 3.70* 3.66* 3.40*  CALCIUM  --  8.1*   < > 8.1* 8.0* 8.0* 8.0*  MG 1.7  --   --   --   --   --   --   PHOS  --  4.8*  5.2*  --  5.4* 4.1  --   --    < > =  values in this interval not displayed.     CBC: Recent Labs  Lab 01/06/21 0628 01/06/21 1923 01/07/21 0445  WBC 7.2 8.6 8.3  HGB 10.5* 9.4* 9.7*  HCT 31.6* 28.6* 28.9*  MCV 94.6 94.1 93.8  PLT 184 189 131*      Lab Results  Component Value Date   HEPBSAG NON REACTIVE 01/04/2021      Microbiology:  Recent Results (from the past 240 hour(s))  Resp Panel by RT-PCR (Flu A&B, Covid) Nasopharyngeal Swab     Status: None   Collection Time: 12/31/20  6:39 PM   Specimen: Nasopharyngeal Swab; Nasopharyngeal(NP) swabs in vial transport medium  Result Value Ref Range Status   SARS Coronavirus 2 by RT PCR NEGATIVE NEGATIVE Final    Comment: (NOTE) SARS-CoV-2 target nucleic acids are NOT DETECTED.  The SARS-CoV-2 RNA is generally detectable in upper respiratory specimens during the acute phase of infection. The lowest concentration of SARS-CoV-2 viral copies this assay can detect  is 138 copies/mL. A negative result does not preclude SARS-Cov-2 infection and should not be used as the sole basis for treatment or other patient management decisions. A negative result may occur with  improper specimen collection/handling, submission of specimen other than nasopharyngeal swab, presence of viral mutation(s) within the areas targeted by this assay, and inadequate number of viral copies(<138 copies/mL). A negative result must be combined with clinical observations, patient history, and epidemiological information. The expected result is Negative.  Fact Sheet for Patients:  EntrepreneurPulse.com.au  Fact Sheet for Healthcare Providers:  IncredibleEmployment.be  This test is no t yet approved or cleared by the [place] FDA and  has been authorized for detection and/or diagnosis of SARS-CoV-2 by FDA under an Emergency Use Authorization (EUA). This EUA will remain  in effect (meaning this test can be used) for the duration of the COVID-19  declaration under Section 564(b)(1) of the Act, 21 U.S.C.section 360bbb-3(b)(1), unless the authorization is terminated  or revoked sooner.       Influenza A by PCR NEGATIVE NEGATIVE Final   Influenza B by PCR NEGATIVE NEGATIVE Final    Comment: (NOTE) The Xpert Xpress SARS-CoV-2/FLU/RSV plus assay is intended as an aid in the diagnosis of influenza from Nasopharyngeal swab specimens and should not be used as a sole basis for treatment. Nasal washings and aspirates are unacceptable for Xpert Xpress SARS-CoV-2/FLU/RSV testing.  Fact Sheet for Patients: EntrepreneurPulse.com.au  Fact Sheet for Healthcare Providers: IncredibleEmployment.be  This test is not yet approved or cleared by the [place] FDA and has been authorized for detection and/or diagnosis of SARS-CoV-2 by FDA under an Emergency Use Authorization (EUA). This EUA will remain in effect (meaning this test can be used) for the duration of the COVID-19 declaration under Section 564(b)(1) of the Act, 21 U.S.C. section 360bbb-3(b)(1), unless the authorization is terminated or revoked.  Performed at [facility], [place], [place] [address]   SARS CORONAVIRUS 2 (TAT 6-24 HRS) Nasopharyngeal Nasopharyngeal Swab     Status: None   Collection Time: 01/06/21  6:20 PM   Specimen: Nasopharyngeal Swab  Result Value Ref Range Status   SARS Coronavirus 2 NEGATIVE NEGATIVE Final    Comment: (NOTE) SARS-CoV-2 target nucleic acids are NOT DETECTED.  The SARS-CoV-2 RNA is generally detectable in upper and lower respiratory specimens during the acute phase of infection. Negative results do not preclude SARS-CoV-2 infection, do not rule out co-infections with other pathogens, and should not be used as the sole basis for treatment or other patient management decisions. Negative results must be combined with clinical observations, patient history, and epidemiological  information. The expected result is Negative.  Fact Sheet for Patients: SugarRoll.be  Fact Sheet for Healthcare Providers: https://www.woods-mathews.com/  This test is not yet approved or cleared by the [place] FDA and  has been authorized for detection and/or diagnosis of SARS-CoV-2 by FDA under an Emergency Use Authorization (EUA). This EUA will remain  in effect (meaning this test can be used) for the duration of the COVID-19 declaration under Se ction 564(b)(1) of the Act, 21 U.S.C. section 360bbb-3(b)(1), unless the authorization is terminated or revoked sooner.  Performed at [facility], [place] [address], [place], [place] [address]     Coagulation Studies: No results for input(s): LABPROT, INR in the last 72 hours.  Urinalysis: No results for input(s): COLORURINE, LABSPEC, PHURINE, GLUCOSEU, HGBUR, BILIRUBINUR, KETONESUR, PROTEINUR, UROBILINOGEN, NITRITE, LEUKOCYTESUR in the last 72 hours.  Invalid input(s): APPERANCEUR  Imaging: No results found.   Medications:   . sodium chloride Stopped (01/01/21 1614)   . (feeding supplement) PROSource Plus  30 mL Oral Daily  . aspirin EC  81 mg Oral BID  . atorvastatin  20 mg Oral Daily  . carvedilol  25 mg Oral BID  . Chlorhexidine Gluconate Cloth  6 each Topical Q0600  . feeding supplement (NEPRO CARB STEADY)  237 mL Oral Q24H  . furosemide  80 mg Oral Daily  . heparin injection (subcutaneous)  5,000 Units Subcutaneous Q8H  . insulin aspart  0-5 Units Subcutaneous QHS  . insulin aspart  0-9 Units Subcutaneous TID WC  . insulin aspart  5 Units Subcutaneous TID WC  . insulin detemir  15 Units Subcutaneous BID AC & HS  . metoCLOPramide  5 mg Oral 5 X Daily  . multivitamin  1 tablet Oral Daily  . nicotine  21 mg Transdermal Daily  . pantoprazole  40 mg Oral BID  . polyethylene glycol  17 g Oral Daily  . senna-docusate  2 tablet Oral BID   acetaminophen, ALPRAZolam,  HYDROcodone-acetaminophen, hydrOXYzine, metoCLOPramide **OR** metoCLOPramide (REGLAN) injection, ondansetron **OR** ondansetron (ZOFRAN) IV, ondansetron (ZOFRAN) IV, oxyCODONE, traZODone  Assessment/ Plan:  75 y.o. 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 was admitted on 12/31/2020 for Hip fracture ([place]) [S72.009A]  Active Problems:   Diabetes mellitus type 2 with complications, uncontrolled ([place])   Essential hypertension   Coronary atherosclerosis   Single kidney   COPD (chronic obstructive pulmonary disease) with chronic bronchitis (HCC)   CKD (chronic kidney disease), stage III ([place])   Morbid obesity ([place])   Atherosclerosis of native arteries of extremity with intermittent claudication (HCC)   PAD (peripheral artery disease) ([place])   History of total knee replacement, left   History of total knee replacement, right   Hip fracture ([place])  Hip fracture ([place]) [S72.009A] Fall, initial encounter [W19.XXXA] Closed 2-part intertrochanteric fracture of left femur, initial encounter ([place]) [S72.142A]     #. ESRD on peritoneal dialysis -Received second dialysis yesterday -Removed 1L of fluid -Will receive third dialysis treatment today -possible d/c tomorrow -Outpatient treatment arranged at [address], TTS -Plan to return to PD once discharged from rehab  #. Anemia of Chronic kidney disease  Lab Results  Component Value Date   HGB 9.7 (L) 01/07/2021   -stable  -Will continue to monitor  #. Secondary hyperparathyroidism   No results found for: PTH Lab Results  Component Value Date   PHOS 4.1 01/06/2021   Monitor calcium and phos level during this admission   #. Diabetes type 2 with CKD Hemoglobin A1C (no units)  Date Value  07/09/2019 8.5   Hgb A1c MFr Bld (%)  Date Value  01/02/2021 10.0 (H)  -Glucose levels stablized -SSI and Levemir ordered for management  # Left Hip Fracture -Left Intramedullary  femoral nail placement on 01/01/21 -PT/OT ordered -S[facility] arranged at [facility] -appreciate surgical involvement     LOS: [place], NP 2/24/202210:29 [place], [place]

## 2021-01-08 NOTE — Care Management Important Message (Signed)
Important Message  Patient Details  Name: Andrea Stewart MRN: [id number] Date of Birth: 1946-03-31   Medicare Important Message Given:  Yes     Juliann Pulse A [place] 01/08/2021, 1:33 PM

## 2021-01-08 NOTE — Progress Notes (Signed)
PROGRESS NOTE    Andrea Stewart   [id number]  DOB: January 31, 1946  PCP: Rusty Aus, MD    DOA: 12/31/2020 LOS: 48   Brief Narrative   75 year old female with a known history of ESRD on peritoneal dialysis, diabetes, hypertension, coronary artery disease, PAD, COPD is admitted for left hip fracture status post fall.  2/17: S/p intramedullary nail repair 2/18-2/20: Working with therapy and hope to go home but remains weak and will need SNF.  PD is switched to hemodialysis for SNF placement. 2/21-right IJ cuffed tunneled hemodialysis catheter placed 2/22: Patient has SNF bed at [facility] but will need to prove that she is able to sit up in a chair for hemodialysis for at least 4 hours which she has not been able to do so far.    2/23 - had dialysis but chair was not available.  Have asked for pt to remain in recliner chair remainder of afternoon with hopeful discharge to SNF tomorrow.   Assessment & Plan   Active Problems:   Diabetes mellitus type 2 with complications, uncontrolled (HCC)   Essential hypertension   Coronary atherosclerosis   Single kidney   COPD (chronic obstructive pulmonary disease) with chronic bronchitis (HCC)   CKD (chronic kidney disease), stage III (HCC)   Morbid obesity (HCC)   Atherosclerosis of native arteries of extremity with intermittent claudication (HCC)   PAD (peripheral artery disease) ([place])   History of total knee replacement, left   History of total knee replacement, right   Hip fracture (HCC)   Left hip fracture -due to mechanical fall.  Status post intramedullary nail repair on 2/17. --Pain control and DVT prophylaxis per Ortho --PT and OT recommending SNF, patient agreeable --D/C to SNF tomorrow 2/25  ESRD -was on peritoneal dialysis.  Transitioned to hemodialysis this admission for SNF placement as patient recovers from her hip fracture.  Plan to return to PD once out of SNF. --Needs to sit in a chair for 4 hours and  tolerate prior to discharge  Chronic diastolic CHF -currently well compensated.  Monitor  Hypokalemia -replaced.  Monitor BMP and replace as needed.  Type 2 diabetes -continue Levemir, NovoLog.  Appreciate diabetes coordinator recommendations.  Generalized anxiety -continue Xanax at bedtime and as needed hydroxyzine.  Hyperlipidemia -continue Lipitor  Essential hypertension -stable.  Continue Coreg  GERD -continue PPI  Gastroparesis -continue Reglan  Constipation -continue bowel regimen, suppository or enemas as needed.   Obesity: Body mass index is 30.14 kg/m.  Complicates overall care and prognosis.  DVT prophylaxis: SCDs Start: 01/01/21 1734 heparin injection 5,000 Units Start: 12/31/20 2245 SCDs Start: 12/31/20 2146   Diet:  Diet Orders (From admission, onward)    Start     Ordered   01/05/21 1406  Diet Carb Modified Fluid consistency: Thin; Room service appropriate? Yes  Diet effective now       Question Answer Comment  Diet-HS Snack? Nothing   Calorie Level Medium 1600-2000   Fluid consistency: Thin   Room service appropriate? Yes      01/05/21 1406            Code Status: Full Code    Subjective 01/08/21    Patient up in chair this AM on rounds.  Reports needing pain medicine after being in chair for 4 hours.  Wants to go back to bed.  Denies other acute complaints. Ready to get out of the hospital.   Disposition Plan & Communication   Status is: Inpatient  Remains inpatient appropriate because:  To d/c to SNF tomorrow, no bed available today.  Dispo:  Patient From: Home  Planned Disposition: [place]  Expected discharge date: 01/09/2021  Medically stable for discharge: No      Consults, Procedures, Significant Events   Consultants:   Orthopedics  Nephrology  Procedures:   2/17 intramedullary nail fixation of left femur fracture  Antimicrobials:  Anti-infectives (From admission, onward)   Start     Dose/Rate Route  Frequency Ordered Stop   01/06/21 0000  clindamycin (CLEOCIN) IVPB 300 mg  Status:  Discontinued       Note to Pharmacy: To be given in specials   300 mg 100 mL/hr over 30 Minutes Intravenous  Once 01/05/21 1025 01/05/21 1210   01/05/21 1104  clindamycin (CLEOCIN) 300 MG/50ML IVPB       Note to Pharmacy: Carlynn Spry   : cabinet override      01/05/21 1104 01/05/21 1138   01/01/21 1455  clindamycin (CLEOCIN) 600 MG/50ML IVPB       Note to Pharmacy: Lyman Bishop   : cabinet override      01/01/21 1455 01/01/21 1528   12/31/20 2308  clindamycin (CLEOCIN) IVPB 600 mg        600 mg 100 mL/hr over 30 Minutes Intravenous 30 min pre-op 12/31/20 2311 01/01/21 1554        Micro    Objective   Vitals:   01/07/21 2351 01/08/21 0418 01/08/21 0748 01/08/21 1202  BP: (!) 151/72 (!) 133/52 140/65 (!) 127/54  Pulse: 73 70 60 68  Resp: '18 18 17 17  '$ Temp: 98.3 F (36.8 C) 98.3 F (36.8 C) 98.6 F (37 C) 98.7 F (37.1 C)  TempSrc: Oral     SpO2: 97% 92% 96% 97%  Weight:      Height:        Intake/Output Summary (Last 24 hours) at 01/08/2021 1445 Last data filed at 01/08/2021 1010 Gross per 24 hour  Intake 120 ml  Output 0 ml  Net 120 ml   Filed Weights   01/04/21 0500 01/05/21 0500 01/06/21 0620  Weight: 86.4 kg 86.8 kg 87.3 kg    Physical Exam:  General exam: awake, appears drowsy, no acute distress, obese Respiratory system: CTAB anteriorly, normal respiratory effort, on room air. Cardiovascular system: normal S1/S2, RRR, no pedal edema, right IJ PermCath in place.   Central nervous system: A&O x3. no gross focal neurologic deficits, normal speech   Labs   Data Reviewed: I have personally reviewed following labs and imaging studies  CBC: Recent Labs  Lab 01/06/21 0628 01/06/21 1923 01/07/21 0445  WBC 7.2 8.6 8.3  HGB 10.5* 9.4* 9.7*  HCT 31.6* 28.6* 28.9*  MCV 94.6 94.1 93.8  PLT 184 189 [id number]*   Basic Metabolic Panel: Recent Labs  Lab 01/03/21 1250  01/05/21 1800 01/06/21 1100 01/06/21 1923 01/07/21 0445 01/08/21 0509  NA  --  126* 127* 129* 128* 131*  K  --  3.0* 3.1* 3.1* 3.3* 3.6  CL  --  92* 91* 97* 95* 99  CO2  --  '27 27 25 24 25  '$ GLUCOSE  --  155* 185* 70 102* 128*  BUN  --  60* 59* 41* 41* 37*  CREATININE  --  5.16* 4.90* 3.70* 3.66* 3.40*  CALCIUM  --  8.1* 8.1* 8.0* 8.0* 8.0*  MG 1.7  --   --   --   --   --   PHOS  --  4.8*   5.2* 5.4* 4.1  --   --    GFR: Estimated Creatinine Clearance: 16.2 mL/min (A) (by C-G formula based on SCr of 3.4 mg/dL (H)). Liver Function Tests: Recent Labs  Lab 01/05/21 1800 01/06/21 1100 01/06/21 1923  ALBUMIN 1.9* 1.9* 2.0*   No results for input(s): LIPASE, AMYLASE in the last 168 hours. No results for input(s): AMMONIA in the last 168 hours. Coagulation Profile: No results for input(s): INR, PROTIME in the last 168 hours. Cardiac Enzymes: No results for input(s): CKTOTAL, CKMB, CKMBINDEX, TROPONINI in the last 168 hours. BNP (last 3 results) No results for input(s): PROBNP in the last 8760 hours. HbA1C: No results for input(s): HGBA1C in the last 72 hours. CBG: Recent Labs  Lab 01/07/21 1619 01/07/21 2200 01/08/21 0016 01/08/21 0748 01/08/21 1201  GLUCAP 182* 310* 207* 97 158*   Lipid Profile: No results for input(s): CHOL, HDL, LDLCALC, TRIG, CHOLHDL, LDLDIRECT in the last 72 hours. Thyroid Function Tests: No results for input(s): TSH, T4TOTAL, FREET4, T3FREE, THYROIDAB in the last 72 hours. Anemia Panel: No results for input(s): VITAMINB12, FOLATE, FERRITIN, TIBC, IRON, RETICCTPCT in the last 72 hours. Sepsis Labs: No results for input(s): PROCALCITON, LATICACIDVEN in the last 168 hours.  Recent Results (from the past 240 hour(s))  Resp Panel by RT-PCR (Flu A&B, Covid) Nasopharyngeal Swab     Status: None   Collection Time: 12/31/20  6:39 PM   Specimen: Nasopharyngeal Swab; Nasopharyngeal(NP) swabs in vial transport medium  Result Value Ref Range Status    SARS Coronavirus 2 by RT PCR NEGATIVE NEGATIVE Final    Comment: (NOTE) SARS-CoV-2 target nucleic acids are NOT DETECTED.  The SARS-CoV-2 RNA is generally detectable in upper respiratory specimens during the acute phase of infection. The lowest concentration of SARS-CoV-2 viral copies this assay can detect is 138 copies/mL. A negative result does not preclude SARS-Cov-2 infection and should not be used as the sole basis for treatment or other patient management decisions. A negative result may occur with  improper specimen collection/handling, submission of specimen other than nasopharyngeal swab, presence of viral mutation(s) within the areas targeted by this assay, and inadequate number of viral copies(<138 copies/mL). A negative result must be combined with clinical observations, patient history, and epidemiological information. The expected result is Negative.  Fact Sheet for Patients:  EntrepreneurPulse.com.au  Fact Sheet for Healthcare Providers:  IncredibleEmployment.be  This test is no t yet approved or cleared by the [place] FDA and  has been authorized for detection and/or diagnosis of SARS-CoV-2 by FDA under an Emergency Use Authorization (EUA). This EUA will remain  in effect (meaning this test can be used) for the duration of the COVID-19 declaration under Section 564(b)(1) of the Act, 21 U.S.C.section 360bbb-3(b)(1), unless the authorization is terminated  or revoked sooner.       Influenza A by PCR NEGATIVE NEGATIVE Final   Influenza B by PCR NEGATIVE NEGATIVE Final    Comment: (NOTE) The Xpert Xpress SARS-CoV-2/FLU/RSV plus assay is intended as an aid in the diagnosis of influenza from Nasopharyngeal swab specimens and should not be used as a sole basis for treatment. Nasal washings and aspirates are unacceptable for Xpert Xpress SARS-CoV-2/FLU/RSV testing.  Fact Sheet for  Patients: EntrepreneurPulse.com.au  Fact Sheet for Healthcare Providers: IncredibleEmployment.be  This test is not yet approved or cleared by the [place] FDA and has been authorized for detection and/or diagnosis of SARS-CoV-2 by FDA under an Emergency Use Authorization (EUA). This EUA will  remain in effect (meaning this test can be used) for the duration of the COVID-19 declaration under Section 564(b)(1) of the Act, 21 U.S.C. section 360bbb-3(b)(1), unless the authorization is terminated or revoked.  Performed at [facility], [place], [place] [address]   SARS CORONAVIRUS 2 (TAT 6-24 HRS) Nasopharyngeal Nasopharyngeal Swab     Status: None   Collection Time: 01/06/21  6:20 PM   Specimen: Nasopharyngeal Swab  Result Value Ref Range Status   SARS Coronavirus 2 NEGATIVE NEGATIVE Final    Comment: (NOTE) SARS-CoV-2 target nucleic acids are NOT DETECTED.  The SARS-CoV-2 RNA is generally detectable in upper and lower respiratory specimens during the acute phase of infection. Negative results do not preclude SARS-CoV-2 infection, do not rule out co-infections with other pathogens, and should not be used as the sole basis for treatment or other patient management decisions. Negative results must be combined with clinical observations, patient history, and epidemiological information. The expected result is Negative.  Fact Sheet for Patients: SugarRoll.be  Fact Sheet for Healthcare Providers: https://www.woods-mathews.com/  This test is not yet approved or cleared by the [place] FDA and  has been authorized for detection and/or diagnosis of SARS-CoV-2 by FDA under an Emergency Use Authorization (EUA). This EUA will remain  in effect (meaning this test can be used) for the duration of the COVID-19 declaration under Se ction 564(b)(1) of the Act, 21 U.S.C. section  360bbb-3(b)(1), unless the authorization is terminated or revoked sooner.  Performed at [facility], [place] [address], [place], [place] [address]       Imaging Studies   No results found.   Medications   Scheduled Meds:  (feeding supplement) PROSource Plus  30 mL Oral Daily   aspirin EC  81 mg Oral BID   atorvastatin  20 mg Oral Daily   carvedilol  25 mg Oral BID   Chlorhexidine Gluconate Cloth  6 each Topical Q0600   feeding supplement (NEPRO CARB STEADY)  237 mL Oral Q24H   furosemide  80 mg Oral Daily   heparin injection (subcutaneous)  5,000 Units Subcutaneous Q8H   insulin aspart  0-5 Units Subcutaneous QHS   insulin aspart  0-9 Units Subcutaneous TID WC   insulin aspart  5 Units Subcutaneous TID WC   insulin detemir  15 Units Subcutaneous BID AC & HS   metoCLOPramide  5 mg Oral 5 X Daily   multivitamin  1 tablet Oral Daily   nicotine  21 mg Transdermal Daily   pantoprazole  40 mg Oral BID   polyethylene glycol  17 g Oral Daily   senna-docusate  2 tablet Oral BID   Continuous Infusions:  sodium chloride Stopped (01/01/21 1614)       LOS: 8 days    Time spent: 20 minutes    Ezekiel Slocumb, DO [facility]  01/08/2021, 2:45 PM      If 7PM-7AM, please contact night-coverage. How to contact the [facility] Attending or Consulting provider [place] or covering provider during after hours [place], for this patient?    1. Check the care team in [facility] and look for a) attending/consulting TRH provider listed and b) the [facility] team listed 2. Log into [email or web address] and use [place]'s universal password to access. If you do not have the password, please contact the hospital operator. 3. Locate the [facility] provider you are looking for under [facility] and page to a number that you can be directly reached.  4. If you still have difficulty reaching the provider, please page the [facility] (Director on Call) for the Hospitalists listed on amion for  assistance.

## 2021-01-08 NOTE — Progress Notes (Signed)
Physical Therapy Treatment Patient Details Name: Andrea Stewart MRN: [id number] DOB: February 20, 1946 Today's Date: 01/08/2021    History of Present Illness Per MD notes: Pt is a 75 y.o. female with history of ESRD on peritoneal dialysis, poorly controlled type 2 diabetes on insulin, hypertension, CAD, PAD, COPD, who presents after a fall.  Pt diagnosed with L hip fracture and is s/p left femoral IM nail.    PT Comments    Pt somewhat fatigued this session but agreed to participate with PT services and put forth good effort.  Pt did not require physical assistance during sidelying to sit for the first time this session.  Pt continued to have difficulty advancing her RLE during limited ambulation and her few steps were small and shuffling. No adverse symptoms noted other than L hip pain with SpO2 and HR WNL.  Pt will benefit from PT services in a SNF setting upon discharge to safely address deficits listed in patient problem list for decreased caregiver assistance and eventual return to PLOF.    Follow Up Recommendations  SNF;Supervision for mobility/OOB     Equipment Recommendations  None recommended by PT    Recommendations for Other Services       Precautions / Restrictions Precautions Precautions: Fall Restrictions Weight Bearing Restrictions: Yes LLE Weight Bearing: Weight bearing as tolerated Other Position/Activity Restrictions: Peritoneal dialysis port RLQ and R IJ permcath    Mobility  Bed Mobility Overal bed mobility: Needs Assistance     Sidelying to sit: Supervision     Sit to sidelying: Min assist General bed mobility comments: Pt able to come to sitting at EOB from sidelying without physical assist but required min A during sit to sidelying for BLE control    Transfers Overall transfer level: Needs assistance Equipment used: Rolling walker (2 wheeled) Transfers: Sit to/from Stand Sit to Stand: From elevated surface;Min guard         General transfer  comment: Sit to/from stand x 3  Ambulation/Gait Ambulation/Gait assistance: Min guard Gait Distance (Feet): 1 Feet Assistive device: Rolling walker (2 wheeled) Gait Pattern/deviations: Step-to pattern;Trunk flexed;Decreased stance time - left;Antalgic;Shuffle Gait velocity: decreased   General Gait Details: Heavy lean on the RW with shuffling steps when advancing the RW   Stairs             Wheelchair Mobility    Modified Rankin (Stroke Patients Only)       Balance Overall balance assessment: Needs assistance   Sitting balance-Leahy Scale: Normal     Standing balance support: Bilateral upper extremity supported;During functional activity Standing balance-Leahy Scale: Fair Standing balance comment: Heavy lean on the RW for support                            Cognition Arousal/Alertness: Lethargic Behavior During Therapy: WFL for tasks assessed/performed Overall Cognitive Status: Within Functional Limits for tasks assessed                                        Exercises Total Joint Exercises Long Arc Quad: AROM;Strengthening;Both;10 reps;15 reps Knee Flexion: 10 reps;Both;Strengthening;AROM;15 reps Marching in Standing: AROM;Left;5 reps;Standing Other Exercises Other Exercises: Standing low amplitude mini-squats 2 x 5 with cues for increased force from the LLE Other Exercises: Standing left/right weight shifting    General Comments        Pertinent Vitals/Pain Pain  Assessment: 0-10 Pain Score: 3  Pain Location: L hip Pain Descriptors / Indicators: Sore Pain Intervention(s): Premedicated before session;Monitored during session    Home Living                      Prior Function            PT Goals (current goals can now be found in the care plan section) Progress towards PT goals: Not progressing toward goals - comment (Difficulty with LLE weight bearing)    Frequency    BID      PT Plan Current plan  remains appropriate    Co-evaluation              AM-PAC PT "6 Clicks" Mobility   Outcome Measure  Help needed turning from your back to your side while in a flat bed without using bedrails?: A Little Help needed moving from lying on your back to sitting on the side of a flat bed without using bedrails?: A Little Help needed moving to and from a bed to a chair (including a wheelchair)?: A Lot Help needed standing up from a chair using your arms (e.g., wheelchair or bedside chair)?: A Little Help needed to walk in hospital room?: Total Help needed climbing 3-5 steps with a railing? : Total 6 Click Score: 13    End of Session Equipment Utilized During Treatment: Gait belt Activity Tolerance: Patient tolerated treatment well Patient left: with call bell/phone within reach;with bed alarm set;in bed Nurse Communication: Mobility status;Weight bearing status PT Visit Diagnosis: Other abnormalities of gait and mobility (R26.89);Muscle weakness (generalized) (M62.81);Pain Pain - Right/Left: Left Pain - part of body: Hip     Time: [id number] PT Time Calculation (min) (ACUTE ONLY): 26 min  Charges:  $Gait Training: 8-22 mins $Therapeutic Exercise: 8-22 mins                     D. Scott Keoki Mchargue PT, DPT 01/08/21, 1:07 PM

## 2021-01-09 DIAGNOSIS — Z794 Long term (current) use of insulin: Secondary | ICD-10-CM | POA: Diagnosis not present

## 2021-01-09 DIAGNOSIS — E1122 Type 2 diabetes mellitus with diabetic chronic kidney disease: Secondary | ICD-10-CM | POA: Diagnosis not present

## 2021-01-09 DIAGNOSIS — Z905 Acquired absence of kidney: Secondary | ICD-10-CM | POA: Diagnosis not present

## 2021-01-09 DIAGNOSIS — N186 End stage renal disease: Secondary | ICD-10-CM | POA: Diagnosis not present

## 2021-01-09 DIAGNOSIS — K219 Gastro-esophageal reflux disease without esophagitis: Secondary | ICD-10-CM | POA: Diagnosis not present

## 2021-01-09 DIAGNOSIS — Z87891 Personal history of nicotine dependence: Secondary | ICD-10-CM | POA: Diagnosis not present

## 2021-01-09 DIAGNOSIS — K3184 Gastroparesis: Secondary | ICD-10-CM | POA: Diagnosis not present

## 2021-01-09 DIAGNOSIS — E1151 Type 2 diabetes mellitus with diabetic peripheral angiopathy without gangrene: Secondary | ICD-10-CM | POA: Diagnosis not present

## 2021-01-09 DIAGNOSIS — K5900 Constipation, unspecified: Secondary | ICD-10-CM | POA: Diagnosis not present

## 2021-01-09 DIAGNOSIS — J449 Chronic obstructive pulmonary disease, unspecified: Secondary | ICD-10-CM | POA: Diagnosis not present

## 2021-01-09 DIAGNOSIS — I2510 Atherosclerotic heart disease of native coronary artery without angina pectoris: Secondary | ICD-10-CM | POA: Diagnosis not present

## 2021-01-09 DIAGNOSIS — R5381 Other malaise: Secondary | ICD-10-CM | POA: Diagnosis not present

## 2021-01-09 DIAGNOSIS — G4700 Insomnia, unspecified: Secondary | ICD-10-CM | POA: Diagnosis not present

## 2021-01-09 DIAGNOSIS — E1143 Type 2 diabetes mellitus with diabetic autonomic (poly)neuropathy: Secondary | ICD-10-CM | POA: Diagnosis not present

## 2021-01-09 DIAGNOSIS — E1165 Type 2 diabetes mellitus with hyperglycemia: Secondary | ICD-10-CM | POA: Diagnosis not present

## 2021-01-09 DIAGNOSIS — E1140 Type 2 diabetes mellitus with diabetic neuropathy, unspecified: Secondary | ICD-10-CM | POA: Diagnosis not present

## 2021-01-09 DIAGNOSIS — Z992 Dependence on renal dialysis: Secondary | ICD-10-CM | POA: Diagnosis not present

## 2021-01-09 DIAGNOSIS — E785 Hyperlipidemia, unspecified: Secondary | ICD-10-CM | POA: Diagnosis not present

## 2021-01-09 DIAGNOSIS — I10 Essential (primary) hypertension: Secondary | ICD-10-CM | POA: Diagnosis not present

## 2021-01-09 DIAGNOSIS — E118 Type 2 diabetes mellitus with unspecified complications: Secondary | ICD-10-CM | POA: Diagnosis not present

## 2021-01-09 DIAGNOSIS — Z96653 Presence of artificial knee joint, bilateral: Secondary | ICD-10-CM | POA: Diagnosis not present

## 2021-01-09 DIAGNOSIS — M6281 Muscle weakness (generalized): Secondary | ICD-10-CM | POA: Diagnosis not present

## 2021-01-09 DIAGNOSIS — W19XXXD Unspecified fall, subsequent encounter: Secondary | ICD-10-CM | POA: Diagnosis not present

## 2021-01-09 DIAGNOSIS — S72002D Fracture of unspecified part of neck of left femur, subsequent encounter for closed fracture with routine healing: Secondary | ICD-10-CM | POA: Diagnosis not present

## 2021-01-09 DIAGNOSIS — F419 Anxiety disorder, unspecified: Secondary | ICD-10-CM | POA: Diagnosis not present

## 2021-01-09 DIAGNOSIS — N2581 Secondary hyperparathyroidism of renal origin: Secondary | ICD-10-CM | POA: Diagnosis not present

## 2021-01-09 DIAGNOSIS — S7292XA Unspecified fracture of left femur, initial encounter for closed fracture: Secondary | ICD-10-CM | POA: Diagnosis not present

## 2021-01-09 DIAGNOSIS — R279 Unspecified lack of coordination: Secondary | ICD-10-CM | POA: Diagnosis not present

## 2021-01-09 DIAGNOSIS — I5032 Chronic diastolic (congestive) heart failure: Secondary | ICD-10-CM | POA: Diagnosis not present

## 2021-01-09 DIAGNOSIS — I70212 Atherosclerosis of native arteries of extremities with intermittent claudication, left leg: Secondary | ICD-10-CM | POA: Diagnosis not present

## 2021-01-09 DIAGNOSIS — E876 Hypokalemia: Secondary | ICD-10-CM | POA: Diagnosis not present

## 2021-01-09 DIAGNOSIS — I130 Hypertensive heart and chronic kidney disease with heart failure and stage 1 through stage 4 chronic kidney disease, or unspecified chronic kidney disease: Secondary | ICD-10-CM | POA: Diagnosis not present

## 2021-01-09 DIAGNOSIS — D631 Anemia in chronic kidney disease: Secondary | ICD-10-CM | POA: Diagnosis not present

## 2021-01-09 DIAGNOSIS — I70219 Atherosclerosis of native arteries of extremities with intermittent claudication, unspecified extremity: Secondary | ICD-10-CM | POA: Diagnosis not present

## 2021-01-09 LAB — RESP PANEL BY RT-PCR (FLU A&B, COVID) ARPGX2
Influenza A by PCR: NEGATIVE
Influenza B by PCR: NEGATIVE
SARS Coronavirus 2 by RT PCR: NEGATIVE

## 2021-01-09 LAB — SARS CORONAVIRUS 2 (TAT 6-24 HRS): SARS Coronavirus 2: NEGATIVE

## 2021-01-09 LAB — GLUCOSE, CAPILLARY
Glucose-Capillary: 263 mg/dL — ABNORMAL HIGH (ref 70–99)
Glucose-Capillary: 218 mg/dL — ABNORMAL HIGH (ref 70–99)
Glucose-Capillary: 201 mg/dL — ABNORMAL HIGH (ref 70–99)

## 2021-01-09 MED ORDER — ONDANSETRON HCL 4 MG PO TABS: 4.0000 mg | ORAL_TABLET | Freq: Four times a day (QID) | ORAL | 0 refills | Status: DC | PRN

## 2021-01-09 MED ORDER — RENA-VITE PO TABS: 1.0000 | ORAL_TABLET | Freq: Every day | ORAL | 0 refills | Status: DC

## 2021-01-09 MED ORDER — INSULIN DETEMIR 100 UNIT/ML ~~LOC~~ SOLN: 15.0000 [IU] | Freq: Two times a day (BID) | SUBCUTANEOUS | 11 refills | Status: DC

## 2021-01-09 MED ORDER — INSULIN ASPART 100 UNIT/ML ~~LOC~~ SOLN: 5.0000 [IU] | Freq: Three times a day (TID) | SUBCUTANEOUS | 11 refills | Status: DC

## 2021-01-09 MED ORDER — HYDROXYZINE HCL 25 MG PO TABS: 25.0000 mg | ORAL_TABLET | Freq: Two times a day (BID) | ORAL | 0 refills | Status: DC | PRN

## 2021-01-09 MED ORDER — ASPIRIN 81 MG PO TBEC: 81.0000 mg | DELAYED_RELEASE_TABLET | Freq: Two times a day (BID) | ORAL | 11 refills | Status: DC

## 2021-01-09 MED ORDER — NEPRO/CARBSTEADY PO LIQD: 237.0000 mL | ORAL | 0 refills | Status: DC

## 2021-01-09 MED ORDER — ALPRAZOLAM 0.5 MG PO TABS: 0.5000 mg | ORAL_TABLET | Freq: Three times a day (TID) | ORAL | 0 refills | Status: AC | PRN

## 2021-01-09 MED ORDER — POLYETHYLENE GLYCOL 3350 17 G PO PACK: 17.0000 g | PACK | Freq: Every day | ORAL | 0 refills | Status: DC

## 2021-01-09 MED ORDER — HYDROCODONE-ACETAMINOPHEN 5-325 MG PO TABS: 1.0000 | ORAL_TABLET | ORAL | 0 refills | Status: AC | PRN

## 2021-01-09 MED ORDER — ACETAMINOPHEN 325 MG PO TABS: 650.0000 mg | ORAL_TABLET | Freq: Four times a day (QID) | ORAL | Status: AC | PRN

## 2021-01-09 MED ORDER — PROSOURCE PLUS PO LIQD: 30.0000 mL | Freq: Every day | ORAL | Status: DC

## 2021-01-09 MED ORDER — NICOTINE 21 MG/24HR TD PT24: 21.0000 mg | MEDICATED_PATCH | Freq: Every day | TRANSDERMAL | 0 refills | Status: DC

## 2021-01-09 MED ORDER — SENNOSIDES-DOCUSATE SODIUM 8.6-50 MG PO TABS: 2.0000 | ORAL_TABLET | Freq: Two times a day (BID) | ORAL | Status: DC

## 2021-01-09 MED ORDER — INSULIN ASPART 100 UNIT/ML ~~LOC~~ SOLN: 0.0000 [IU] | Freq: Three times a day (TID) | SUBCUTANEOUS | 11 refills | Status: DC

## 2021-01-09 NOTE — Progress Notes (Signed)
Physical Therapy Treatment Patient Details Name: Andrea Stewart MRN: [id number] DOB: 1946-01-02 Today's Date: 01/09/2021    History of Present Illness Per MD notes: Pt is a 75 y.o. female with history of ESRD on peritoneal dialysis, poorly controlled type 2 diabetes on insulin, hypertension, CAD, PAD, COPD, who presents after a fall.  Pt diagnosed with L hip fracture and is s/p left femoral IM nail.    PT Comments    Pt was pleasant and motivated to participate during the session.  Pt put forth good effort throughout the session but continued to have significant difficulty with WB through the LLE during gait.  Pt was able to clear the floor several time when advancing the RLE, mostly a hop-to gait pattern, but mostly shuffled the R foot forward.  Pt reported no adverse symptoms during the session other than LLE pain with SpO2 and HR WNL.  Pt will benefit from PT services in a SNF setting upon discharge to safely address deficits listed in patient problem list for decreased caregiver assistance and eventual return to PLOF.     Follow Up Recommendations  SNF;Supervision for mobility/OOB     Equipment Recommendations  None recommended by PT    Recommendations for Other Services       Precautions / Restrictions Precautions Precautions: Fall Restrictions Weight Bearing Restrictions: Yes LLE Weight Bearing: Weight bearing as tolerated Other Position/Activity Restrictions: Peritoneal dialysis port RLQ and R IJ permcath    Mobility  Bed Mobility               General bed mobility comments: NT, pt found sitting at EOB    Transfers Overall transfer level: Needs assistance Equipment used: Rolling walker (2 wheeled) Transfers: Sit to/from Stand Sit to Stand: From elevated surface;Min guard         General transfer comment: Sit to/from stand x 4 from bed/recliner with cues for hand placement  Ambulation/Gait Ambulation/Gait assistance: Min guard Gait Distance (Feet): 3  Feet x 2 Assistive device: Rolling walker (2 wheeled) Gait Pattern/deviations: Step-to pattern;Trunk flexed;Decreased stance time - left;Antalgic;Shuffle Gait velocity: decreased   General Gait Details: Heavy lean on the RW with RLE occasionally hopping but mostly shuffling   Stairs             Wheelchair Mobility    Modified Rankin (Stroke Patients Only)       Balance Overall balance assessment: Needs assistance   Sitting balance-Leahy Scale: Normal     Standing balance support: Bilateral upper extremity supported;During functional activity Standing balance-Leahy Scale: Fair Standing balance comment: Heavy lean on the RW for support                            Cognition Arousal/Alertness: Awake/alert Behavior During Therapy: WFL for tasks assessed/performed Overall Cognitive Status: Within Functional Limits for tasks assessed                                        Exercises Total Joint Exercises Ankle Circles/Pumps: AROM;Strengthening;Both;10 reps Quad Sets: 10 reps;Both;Strengthening Gluteal Sets: Strengthening;Both;10 reps Long Arc Quad: AROM;Strengthening;Both;10 reps;15 reps Knee Flexion: 10 reps;Both;Strengthening;AROM;15 reps Marching in Standing: AROM;Left;5 reps;Standing    General Comments        Pertinent Vitals/Pain Pain Assessment: 0-10 Pain Score: 2  Pain Location: L hip Pain Descriptors / Indicators: Sore Pain Intervention(s): Premedicated before session;Monitored during  session    Home Living                      Prior Function            PT Goals (current goals can now be found in the care plan section) Progress towards PT goals: Progressing toward goals    Frequency    BID      PT Plan Current plan remains appropriate    Co-evaluation              AM-PAC PT "6 Clicks" Mobility   Outcome Measure  Help needed turning from your back to your side while in a flat bed without  using bedrails?: A Little Help needed moving from lying on your back to sitting on the side of a flat bed without using bedrails?: A Little Help needed moving to and from a bed to a chair (including a wheelchair)?: A Lot Help needed standing up from a chair using your arms (e.g., wheelchair or bedside chair)?: A Little Help needed to walk in hospital room?: Total Help needed climbing 3-5 steps with a railing? : Total 6 Click Score: 13    End of Session Equipment Utilized During Treatment: Gait belt Activity Tolerance: Patient tolerated treatment well Patient left: in chair;with call bell/phone within reach;with chair alarm set Nurse Communication: Mobility status;Weight bearing status PT Visit Diagnosis: Other abnormalities of gait and mobility (R26.89);Muscle weakness (generalized) (M62.81);Pain Pain - Right/Left: Left Pain - part of body: Hip     Time: 1010-1035 PT Time Calculation (min) (ACUTE ONLY): 25 min  Charges:  $Gait Training: 8-22 mins $Therapeutic Exercise: 8-22 mins                     D. Scott Joanna Borawski PT, DPT 01/09/21, 1:24 PM

## 2021-01-09 NOTE — Plan of Care (Signed)
  Problem: Clinical Measurements: Goal: Will remain free from infection Outcome: Progressing   Problem: Clinical Measurements: Goal: Ability to maintain clinical measurements within normal limits will improve Outcome: Progressing   Problem: Education: Goal: Knowledge of General Education information will improve Description: Including pain rating scale, medication(s)/side effects and non-pharmacologic comfort measures Outcome: Progressing   

## 2021-01-09 NOTE — TOC Transition Note (Signed)
Transition of Care [facility]) - CM/SW Discharge Note   Patient Details  Name: KEYANDA HETMAN MRN: [id number] Date of Birth: 1946-03-15  Transition of Care [facility]) CM/SW Contact:  Magnus Ivan, LCSW Phone Number: 01/09/2021, 1:55 PM   Clinical Narrative:   Patient to discharge to [facility] today, Room 412. Confirmed with Magda Paganini. CSW updated MD, RN, patient's spouse. Asked RN to call report and MD to submit DC Summary. Medical Necessity Form and Face Sheet placed in Discharge Packet by patient chart. EMS transport arranged for 2:30 pick up with [facility]. No other needs identified prior to discharge.    Final next level of care: Skilled Nursing Facility Barriers to Discharge: Barriers Resolved   Patient Goals and CMS Choice Patient states their goals for this hospitalization and ongoing recovery are:: SNF rehab CMS Medicare.gov Compare Post Acute Care list provided to:: Patient Choice offered to / list presented to : Patient  Discharge Placement              Patient chooses bed at: [facility] Patient to be transferred to facility by: [facility] Name of family member notified: spouse Patient and family notified of of transfer: 01/09/21  Discharge Plan and Services                                     Social Determinants of Health (SDOH) Interventions     Readmission Risk Interventions No flowsheet data found.

## 2021-01-09 NOTE — Progress Notes (Signed)
[place], [name] 01/09/21  Subjective:   Andrea Stewart is a 75 y.o. 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 She states she was getting out of shower and fell on the carpet.   She is admitted with a  Left Hip Fracture.  Patient seen resting in bed Alert and oriented Able to tolerate meals without nausea Denies shortness of breath and chest pain  Objective:  Vital signs in last 24 hours:  Temp:  [97.4 F (36.3 C)-98.7 F (37.1 C)] 98.6 F (37 C) (02/25 0753) Pulse Rate:  [58-88] 71 (02/25 0753) Resp:  [14-21] 18 (02/25 0753) BP: (115-147)/(51-69) 147/65 (02/25 0753) SpO2:  [94 %-98 %] 94 % (02/25 0753)  Weight change:  Filed Weights   01/04/21 0500 01/05/21 0500 01/06/21 0620  Weight: 86.4 kg 86.8 kg 87.3 kg    Intake/Output:    Intake/Output Summary (Last 24 hours) at 01/09/2021 1016 Last data filed at 01/08/2021 2130 Gross per 24 hour  Intake 240 ml  Output 1000 ml  Net -760 ml     Physical Exam: General: NAD, resting in bed  HEENT Normocephalic, moist oral mucosa  Pulm/lungs Clear  CVS/Heart regular  Abdomen:  Soft, non-tender  Extremities: No peripheral edema  Neurologic: Alert, oriented, able to answer questions  Skin: Left hip bandage  Access: PD catheter, RT IJ Permcath       Basic Metabolic Panel:  Recent Labs  Lab 01/03/21 1250 01/05/21 1800 01/05/21 1800 01/06/21 1100 01/06/21 1923 01/07/21 0445 01/08/21 0509  NA  --  126*  --  127* 129* 128* 131*  K  --  3.0*  --  3.1* 3.1* 3.3* 3.6  CL  --  92*  --  91* 97* 95* 99  CO2  --  27  --  '27 25 24 25  '$ GLUCOSE  --  155*  --  185* 70 102* 128*  BUN  --  60*  --  59* 41* 41* 37*  CREATININE  --  5.16*  --  4.90* 3.70* 3.66* 3.40*  CALCIUM  --  8.1*   < > 8.1* 8.0* 8.0* 8.0*  MG 1.7  --   --   --   --   --   --   PHOS  --  4.8*  5.2*  --  5.4* 4.1  --   --    < > = values in this interval not  displayed.     CBC: Recent Labs  Lab 01/06/21 0628 01/06/21 1923 01/07/21 0445  WBC 7.2 8.6 8.3  HGB 10.5* 9.4* 9.7*  HCT 31.6* 28.6* 28.9*  MCV 94.6 94.1 93.8  PLT 184 189 131*      Lab Results  Component Value Date   HEPBSAG NON REACTIVE 01/04/2021      Microbiology:  Recent Results (from the past 240 hour(s))  Resp Panel by RT-PCR (Flu A&B, Covid) Nasopharyngeal Swab     Status: None   Collection Time: 12/31/20  6:39 PM   Specimen: Nasopharyngeal Swab; Nasopharyngeal(NP) swabs in vial transport medium  Result Value Ref Range Status   SARS Coronavirus 2 by RT PCR NEGATIVE NEGATIVE Final    Comment: (NOTE) SARS-CoV-2 target nucleic acids are NOT DETECTED.  The SARS-CoV-2 RNA is generally detectable in upper respiratory specimens during the acute phase of infection. The lowest concentration of SARS-CoV-2 viral copies this assay can detect is 138 copies/mL. A negative result does  not preclude SARS-Cov-2 infection and should not be used as the sole basis for treatment or other patient management decisions. A negative result may occur with  improper specimen collection/handling, submission of specimen other than nasopharyngeal swab, presence of viral mutation(s) within the areas targeted by this assay, and inadequate number of viral copies(<138 copies/mL). A negative result must be combined with clinical observations, patient history, and epidemiological information. The expected result is Negative.  Fact Sheet for Patients:  EntrepreneurPulse.com.au  Fact Sheet for Healthcare Providers:  IncredibleEmployment.be  This test is no t yet approved or cleared by the [place] FDA and  has been authorized for detection and/or diagnosis of SARS-CoV-2 by FDA under an Emergency Use Authorization (EUA). This EUA will remain  in effect (meaning this test can be used) for the duration of the COVID-19 declaration under Section  564(b)(1) of the Act, 21 U.S.C.section 360bbb-3(b)(1), unless the authorization is terminated  or revoked sooner.       Influenza A by PCR NEGATIVE NEGATIVE Final   Influenza B by PCR NEGATIVE NEGATIVE Final    Comment: (NOTE) The Xpert Xpress SARS-CoV-2/FLU/RSV plus assay is intended as an aid in the diagnosis of influenza from Nasopharyngeal swab specimens and should not be used as a sole basis for treatment. Nasal washings and aspirates are unacceptable for Xpert Xpress SARS-CoV-2/FLU/RSV testing.  Fact Sheet for Patients: EntrepreneurPulse.com.au  Fact Sheet for Healthcare Providers: IncredibleEmployment.be  This test is not yet approved or cleared by the [place] FDA and has been authorized for detection and/or diagnosis of SARS-CoV-2 by FDA under an Emergency Use Authorization (EUA). This EUA will remain in effect (meaning this test can be used) for the duration of the COVID-19 declaration under Section 564(b)(1) of the Act, 21 U.S.C. section 360bbb-3(b)(1), unless the authorization is terminated or revoked.  Performed at [facility], [place], [place] [address]   SARS CORONAVIRUS 2 (TAT 6-24 HRS) Nasopharyngeal Nasopharyngeal Swab     Status: None   Collection Time: 01/06/21  6:20 PM   Specimen: Nasopharyngeal Swab  Result Value Ref Range Status   SARS Coronavirus 2 NEGATIVE NEGATIVE Final    Comment: (NOTE) SARS-CoV-2 target nucleic acids are NOT DETECTED.  The SARS-CoV-2 RNA is generally detectable in upper and lower respiratory specimens during the acute phase of infection. Negative results do not preclude SARS-CoV-2 infection, do not rule out co-infections with other pathogens, and should not be used as the sole basis for treatment or other patient management decisions. Negative results must be combined with clinical observations, patient history, and epidemiological information. The  expected result is Negative.  Fact Sheet for Patients: SugarRoll.be  Fact Sheet for Healthcare Providers: https://www.woods-mathews.com/  This test is not yet approved or cleared by the [place] FDA and  has been authorized for detection and/or diagnosis of SARS-CoV-2 by FDA under an Emergency Use Authorization (EUA). This EUA will remain  in effect (meaning this test can be used) for the duration of the COVID-19 declaration under Se ction 564(b)(1) of the Act, 21 U.S.C. section 360bbb-3(b)(1), unless the authorization is terminated or revoked sooner.  Performed at [facility], [place] [address], [place], [place] [address]     Coagulation Studies: No results for input(s): LABPROT, INR in the last 72 hours.  Urinalysis: No results for input(s): COLORURINE, LABSPEC, PHURINE, GLUCOSEU, HGBUR, BILIRUBINUR, KETONESUR, PROTEINUR, UROBILINOGEN, NITRITE, LEUKOCYTESUR in the last 72 hours.  Invalid input(s): APPERANCEUR    Imaging: No results found.  Medications:   . sodium chloride Stopped (01/01/21 1614)   . (feeding supplement) PROSource Plus  30 mL Oral Daily  . aspirin EC  81 mg Oral BID  . atorvastatin  20 mg Oral Daily  . carvedilol  25 mg Oral BID  . Chlorhexidine Gluconate Cloth  6 each Topical Q0600  . feeding supplement (NEPRO CARB STEADY)  237 mL Oral Q24H  . furosemide  80 mg Oral Daily  . heparin injection (subcutaneous)  5,000 Units Subcutaneous Q8H  . insulin aspart  0-5 Units Subcutaneous QHS  . insulin aspart  0-9 Units Subcutaneous TID WC  . insulin aspart  5 Units Subcutaneous TID WC  . insulin detemir  15 Units Subcutaneous BID AC & HS  . metoCLOPramide  5 mg Oral 5 X Daily  . multivitamin  1 tablet Oral Daily  . nicotine  21 mg Transdermal Daily  . pantoprazole  40 mg Oral BID  . polyethylene glycol  17 g Oral Daily  . senna-docusate  2 tablet Oral BID   acetaminophen, ALPRAZolam,  HYDROcodone-acetaminophen, hydrOXYzine, metoCLOPramide **OR** metoCLOPramide (REGLAN) injection, ondansetron **OR** ondansetron (ZOFRAN) IV, oxyCODONE, traZODone  Assessment/ Plan:  75 y.o. 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 was admitted on 12/31/2020 for Hip fracture ([place]) [S72.009A]  Active Problems:   Diabetes mellitus type 2 with complications, uncontrolled ([place])   Essential hypertension   Coronary atherosclerosis   Single kidney   COPD (chronic obstructive pulmonary disease) with chronic bronchitis (HCC)   CKD (chronic kidney disease), stage III ([place])   Morbid obesity ([place])   Atherosclerosis of native arteries of extremity with intermittent claudication (HCC)   PAD (peripheral artery disease) ([place])   History of total knee replacement, left   History of total knee replacement, right   Hip fracture ([place])  Hip fracture ([place]) [S72.009A] Fall, initial encounter [W19.XXXA] Closed 2-part intertrochanteric fracture of left femur, initial encounter ([place]) [S72.142A]   [address], TTS, Rt IJ Permcath  #. ESRD on peritoneal dialysis -Received third dialysis yesterday -Removed 1L of fluid -D/c today -Outpatient treatment arranged at [address], TTS -Plan to return to PD once discharged from rehab  #. Anemia of Chronic kidney disease  Lab Results  Component Value Date   HGB 9.7 (L) 01/07/2021   -stable  -Will continue to monitor  #. Secondary hyperparathyroidism   No results found for: PTH Lab Results  Component Value Date   PHOS 4.1 01/06/2021   Monitor calcium and phos level during this admission   #. Diabetes type 2 with CKD Hemoglobin A1C (no units)  Date Value  07/09/2019 8.5   Hgb A1c MFr Bld (%)  Date Value  01/02/2021 10.0 (H)  -Glucose levels stablized -SSI and Levemir ordered for management  # Left Hip Fracture -Left Intramedullary femoral nail placement on 01/01/21 -PT/OT ordered -SNF  arranged at [facility] -appreciate surgical involvement     LOS: [address], NP 2/25/202210:16 [place], [place]

## 2021-01-09 NOTE — Discharge Summary (Signed)
Physician Discharge Summary  Andrea Stewart [id number] DOB: 02/09/1946 DOA: 12/31/2020  PCP: Rusty Aus, MD  Admit date: 12/31/2020 Discharge date: 01/09/2021  Admitted From: home Disposition:  SNF / rehab  Recommendations for Outpatient Follow-up:  1. Follow up with PCP in 1-2 weeks 2. Please obtain BMP/CBC in one week 3. Please follow up with orthopedics on March 4th.    Home Health: Mp  Equipment/Devices: Mpme   Discharge Condition: Stable  CODE STATUS: Full  Diet recommendation: Heart Healthy / Carb Modified    Discharge Diagnoses: Active Problems:   Diabetes mellitus type 2 with complications, uncontrolled (HCC)   Essential hypertension   Coronary atherosclerosis   Single kidney   COPD (chronic obstructive pulmonary disease) with chronic bronchitis (HCC)   CKD (chronic kidney disease), stage III (HCC)   Morbid obesity (HCC)   Atherosclerosis of native arteries of extremity with intermittent claudication (HCC)   PAD (peripheral artery disease) (HCC)   History of total knee replacement, left   History of total knee replacement, right   Hip fracture (HCC)    Summary of HPI and Hospital Course:  75 year old female with a known history of ESRD on peritoneal dialysis, diabetes, hypertension, coronary artery disease, PAD, COPD is admitted for left hip fracture status post fall.  2/17: Surgery - intramedullary nail repair 2/18-2/20: Working with therapy and hope to go home but remains weak and will need SNF.   Peritoneal dialysis switched to hemodialysis for SNF placement, as PD unable to be done at [facility]. 2/21-Right IJ cuffed tunneled hemodialysis catheter placed 2/22-2/24: Patient has SNF bed at [facility] but tolerance for sitting in chair 4 hours was initially limited by pain and weakness.  This improved over last few days and patient now able to tolerate sitting in chair for at least 4 hours, and appropriate for d/c to rehab and receive outpatient  hemodialysis      Left hip fracture -due to mechanical fall.  Status post intramedullary nail repair on 2/17. --Pain control and bowel regimen --Aspirin BID for DVT prophylaxis per Ortho --Continue PT at rehab  ESRD -was on peritoneal dialysis prior to admission.  Transitioned to hemodialysis this admission for SNF placement as patient recovers from her hip fracture.  Plan to return to PD once out of SNF.  Chronic diastolic CHF -currently well compensated.  Monitor.  Continue Coreg.  Hypokalemia -replaced.  Recheck BMP in follow up and replace as needed.  Type 2 diabetes -continue Levemir, NovoLog TID WC + sliding scale per orders.  Titrate insulin as needed. Patient requiring less insulin here than prior to admission, may need to be adjusted in short follow up.  Generalized anxiety -continue Xanax and/or hydroxyzine PRN.  Prefer hydroxyzine over Xanax while taking pain medications.  Hyperlipidemia -continue Lipitor  Essential hypertension -stable.  Continue Coreg.  Irbesartan held, stopped on admission as BP's controlled with it held, resuming it could lead to hypotension and patient harm.   Reassess BP in close follow up.  GERD -continue PPI  Gastroparesis -continue Reglan  Constipation -continue bowel regimen, suppository or enemas as needed.   Obesity: Body mass index is 30.14 kg/m.  Complicates overall care and prognosis.  Recommend lifestyle modifications including diet and physical activity for weight loss and overall long term health.    Discharge Instructions   Discharge Instructions    Call MD for:  extreme fatigue   Complete by: As directed    Call MD for:  persistant dizziness or light-headedness  Complete by: As directed    Call MD for:  persistant nausea and vomiting   Complete by: As directed    Call MD for:  redness, tenderness, or signs of infection (pain, swelling, redness, odor or green/yellow discharge around incision site)   Complete  by: As directed    Call MD for:  severe uncontrolled pain   Complete by: As directed    Call MD for:  temperature >100.4   Complete by: As directed    Discharge instructions   Complete by: As directed    Per Orthopedic surgery: - Weight-bearing as tolerated on the left leg - Continue pain control - Aspirin 81 BID for 30 days for DVT prophylaxis - Follow up [id number] staple removal. Call office for appt [phone]  Please monitor Blood Pressure. - irbesartan (Avapro) was stopped to prevent low BP's, it may need to be restarted once out of hospital.  Please monitor Blood Sugars and insulin requirements. - you are currently needing less insulin that before this hospitalization.  Insulin may need to be increased/adjusted in close follow up  Work with physical therapy diligently to regain your mobility and independence.  Please use pain medications sparingly, but also stay "on top of " the pain, so that you are able to tolerate working with PT.  It would be good to have medication about an hour before therapy sessions.    Continue taking stool softeners while using pain medications.  Hold them if you have loose or frequent stools.   Increase activity slowly   Complete by: As directed    Leave dressing on - Keep it clean, dry, and intact until clinic visit   Complete by: As directed      Allergies as of 01/09/2021      Reactions   Morphine Anaphylaxis   Prednisone Other (See Comments)   "makes me think I am having a heart attack"   Gabapentin Other (See Comments)   intolerance   Hydralazine Itching   Nsaids Diarrhea   Penicillins Swelling   TOLERATED CEFAZOLIN 07/2020 Has patient had a PCN reaction causing immediate rash, facial/tongue/throat swelling, SOB or lightheadedness with hypotension: Yes Has patient had a PCN reaction causing severe rash involving mucus membranes or skin necrosis: No Has patient had a PCN reaction that required hospitalization: No Has patient had a  PCN reaction occurring within the last 10 years: No If all of the above answers are "NO", then may proceed with Cephalosporin use.      Medication List    STOP taking these medications   insulin lispro 100 UNIT/ML KwikPen Junior Generic drug: insulin lispro   insulin NPH Human 100 UNIT/ML injection Commonly known as: NOVOLIN N   insulin regular 100 units/mL injection Commonly known as: NOVOLIN R   irbesartan 150 MG tablet Commonly known as: AVAPRO   potassium chloride 10 MEQ CR capsule Commonly known as: MICRO-K   Renal 1 MG Caps Replaced by: multivitamin Tabs tablet     TAKE these medications   (feeding supplement) PROSource Plus liquid Take 30 mLs by mouth daily.   feeding supplement (NEPRO CARB STEADY) Liqd Take 237 mLs by mouth daily.   acetaminophen 325 MG tablet Commonly known as: TYLENOL Take 2 tablets (650 mg total) by mouth every 6 (six) hours as needed for mild pain or headache (headache).   AeroChamber MV inhaler Use as instructed   ALPRAZolam 0.5 MG tablet Commonly known as: XANAX Take 1 tablet (0.5 mg total) by mouth  3 (three) times daily as needed for up to 7 days for anxiety. What changed:   when to take this  reasons to take this   aspirin 81 MG EC tablet Take 1 tablet (81 mg total) by mouth 2 (two) times daily. Swallow whole.   atorvastatin 20 MG tablet Commonly known as: LIPITOR TAKE 1 TABLET BY MOUTH DAILY What changed: when to take this   calcium acetate 667 MG capsule Commonly known as: PHOSLO Take 1,334 mg by mouth 2 (two) times daily.   carvedilol 25 MG tablet Commonly known as: COREG Take 25 mg by mouth 2 (two) times daily.   furosemide 80 MG tablet Commonly known as: LASIX Take 80 mg by mouth daily.   gentamicin cream 0.1 % Commonly known as: GARAMYCIN Apply topically.   HYDROcodone-acetaminophen 5-325 MG tablet Commonly known as: NORCO/VICODIN Take 1-2 tablets by mouth every 4 (four) hours as needed for up to 7 days  (1 tablet for moderate pain, 2 tablets for severe pain).   hydrOXYzine 25 MG tablet Commonly known as: ATARAX/VISTARIL Take 1 tablet (25 mg total) by mouth 2 (two) times daily as needed for anxiety. What changed:   when to take this  reasons to take this   insulin aspart 100 UNIT/ML injection Commonly known as: novoLOG Inject 5 Units into the skin 3 (three) times daily with meals.   insulin aspart 100 UNIT/ML injection Commonly known as: novoLOG Inject 0-9 Units into the skin 4 (four) times daily -  before meals and at bedtime. According to sliding scale as follows: If CBG 70-120: 0 units If CBG 121-150: 1 unit If CBG 151-200: 2 units If CBG 201-250: 3 units If CBG 251-300: 5 units If CBG 301-350: 7 units If CBG 351-400: 9 units If CBG over 400: 12 units and call MD   insulin detemir 100 UNIT/ML injection Commonly known as: LEVEMIR Inject 0.15 mLs (15 Units total) into the skin 2 (two) times daily at 8 am and 10 pm.   multivitamin Tabs tablet Take 1 tablet by mouth daily. Start taking on: January 10, 2021 Replaces: Renal 1 MG Caps   nicotine 21 mg/24hr patch Commonly known as: NICODERM CQ - dosed in mg/24 hours Place 1 patch (21 mg total) onto the skin daily. Start taking on: January 10, 2021   nitroGLYCERIN 0.4 MG SL tablet Commonly known as: NITROSTAT Place 1 tablet (0.4 mg total) under the tongue every 5 (five) minutes as needed. Maximum 3 tablets What changed:   when to take this  reasons to take this   ondansetron 4 MG tablet Commonly known as: ZOFRAN Take 1 tablet (4 mg total) by mouth every 6 (six) hours as needed for nausea.   pantoprazole 40 MG tablet Commonly known as: PROTONIX Take 40 mg by mouth 2 (two) times daily.   polyethylene glycol 17 g packet Commonly known as: MIRALAX / GLYCOLAX Take 17 g by mouth daily.   PROBIOTIC PO Take 1 capsule by mouth daily.   Reglan 5 MG tablet Generic drug: metoCLOPramide Take 5 mg by mouth 5 (five)  times daily.   senna-docusate 8.6-50 MG tablet Commonly known as: Senokot-S Take 2 tablets by mouth 2 (two) times daily.   sevelamer carbonate 800 MG tablet Commonly known as: RENVELA Take 1,600 mg by mouth 3 (three) times daily.   traZODone 50 MG tablet Commonly known as: DESYREL TAKE ONE-HALF TO ONE TABLET BY MOUTH AT BEDTIME AS NEEDED FOR SLEEP What changed: See the new instructions.  Discharge Care Instructions  (From admission, onward)         Start     Ordered   01/09/21 0000  Leave dressing on - Keep it clean, dry, and intact until clinic visit        01/09/21 1012          Contact information for after-discharge care    [facility] .   Service: Skilled Nursing Contact information: [place] [phone]                 Allergies  Allergen Reactions  . Morphine Anaphylaxis  . Prednisone Other (See Comments)    "makes me think I am having a heart attack"  . Gabapentin Other (See Comments)    intolerance  . Hydralazine Itching  . Nsaids Diarrhea  . Penicillins Swelling    TOLERATED CEFAZOLIN 07/2020 Has patient had a PCN reaction causing immediate rash, facial/tongue/throat swelling, SOB or lightheadedness with hypotension: Yes Has patient had a PCN reaction causing severe rash involving mucus membranes or skin necrosis: No Has patient had a PCN reaction that required hospitalization: No Has patient had a PCN reaction occurring within the last 10 years: No If all of the above answers are "NO", then may proceed with Cephalosporin use.      If you experience worsening of your admission symptoms, develop shortness of breath, life threatening emergency, suicidal or homicidal thoughts you must seek medical attention immediately by calling 911 or calling your MD immediately  if symptoms less severe.     Please note   You were cared for by a hospitalist during your hospital stay. If you have any questions about your discharge medications or the care you received while you were in the hospital after you are discharged, you can call the unit and asked to speak with the hospitalist on call if the hospitalist that took care of you is not available. Once you are discharged, your primary care physician will handle any further medical issues. Please note that NO REFILLS for any discharge medications will be authorized once you are discharged, as it is imperative that you return to your primary care physician (or establish a relationship with a primary care physician if you do not have one) for your aftercare needs so that they can reassess your need for medications and monitor your lab values.   Consultations:  Orthopedics    Procedures/Studies: PERIPHERAL VASCULAR CATHETERIZATION  Result Date: 01/05/2021 See op note  DG Chest Portable 1 View  Result Date: 12/31/2020 CLINICAL DATA:  Short of breath, left hip fracture EXAM: PORTABLE CHEST 1 VIEW COMPARISON:  01/08/2019 FINDINGS: Single frontal view of the chest demonstrates an unremarkable cardiac silhouette. No airspace disease, effusion, or pneumothorax. No acute bony abnormalities. IMPRESSION: 1. No acute intrathoracic process. Electronically Signed   By: Randa Ngo M.D.   On: 12/31/2020 18:37   DG HIP OPERATIVE UNILAT W OR W/O PELVIS LEFT  Result Date: 01/01/2021 CLINICAL DATA:  Left hip surgery. EXAM: OPERATIVE LEFT HIP (WITH PELVIS IF PERFORMED) 3 VIEWS TECHNIQUE: Fluoroscopic spot image(s) were submitted for interpretation post-operatively. COMPARISON:  12/31/2020. FINDINGS: ORIF left hip. Hardware intact. Anatomic alignment. Left knee replacement. Three images obtained. Peripheral vascular calcification. 0 minutes 30 seconds fluoroscopy time. Three images obtained. IMPRESSION: ORIF left hip.  Anatomic alignment. Electronically Signed    By: Marcello Moores  Register   On: 01/01/2021  16:33   DG Hip Unilat With Pelvis 2-3 Views Left  Result Date: 12/31/2020 CLINICAL DATA:  [place], left leg deformity EXAM: DG HIP (WITH OR WITHOUT PELVIS) 2-3V LEFT COMPARISON:  None. FINDINGS: Frontal view of the pelvis as well as frontal and cross-table lateral views of the left hip are obtained. There is a comminuted intertrochanteric left hip fracture with impaction and mild varus angulation. No dislocation. No other acute fractures within the remainder of the pelvis or right hip. Peritoneal dialysis catheter coiled within the right lower quadrant. Postsurgical changes at the lumbosacral junction. IMPRESSION: 1. Comminuted intertrochanteric left hip fracture with mild impaction and varus angulation. Electronically Signed   By: Randa Ngo M.D.   On: 12/31/2020 18:36      Dialysis  IM nail fixation of hip fracture   Subjective: Pt feels well today overall.  Pain fairly well controlled when she gets medication, gets severe when med delayed.  No other acute complaints.    Discharge Exam: Vitals:   01/09/21 0300 01/09/21 0753  BP: 116/63 (!) 147/65  Pulse: 80 71  Resp: 18 18  Temp: 98.1 F (36.7 C) 98.6 F (37 C)  SpO2: 95% 94%   Vitals:   01/08/21 2209 01/08/21 2341 01/09/21 0300 01/09/21 0753  BP: (!) 144/51 (!) 129/56 116/63 (!) 147/65  Pulse: 76 88 80 71  Resp: '18 18 18 18  '$ Temp: (!) 97.5 F (36.4 C) (!) 97.4 F (36.3 C) 98.1 F (36.7 C) 98.6 F (37 C)  TempSrc: Oral Oral Oral   SpO2: 98% 95% 95% 94%  Weight:      Height:        General: Pt is alert, awake, not in acute distress Cardiovascular: RRR, S1/S2 +, no rubs, no gallops Respiratory: CTA bilaterally, diminished bases, no wheezing, no rhonchi Abdominal: Soft, NT, ND, bowel sounds + Extremities: no edema, no cyanosis, surgical site on left lateral hip without sequelae of infection    The results of significant diagnostics from this hospitalization (including imaging,  microbiology, ancillary and laboratory) are listed below for reference.     Microbiology: Recent Results (from the past 240 hour(s))  Resp Panel by RT-PCR (Flu A&B, Covid) Nasopharyngeal Swab     Status: None   Collection Time: 12/31/20  6:39 PM   Specimen: Nasopharyngeal Swab; Nasopharyngeal(NP) swabs in vial transport medium  Result Value Ref Range Status   SARS Coronavirus 2 by RT PCR NEGATIVE NEGATIVE Final    Comment: (NOTE) SARS-CoV-2 target nucleic acids are NOT DETECTED.  The SARS-CoV-2 RNA is generally detectable in upper respiratory specimens during the acute phase of infection. The lowest concentration of SARS-CoV-2 viral copies this assay can detect is 138 copies/mL. A negative result does not preclude SARS-Cov-2 infection and should not be used as the sole basis for treatment or other patient management decisions. A negative result may occur with  improper specimen collection/handling, submission of specimen other than nasopharyngeal swab, presence of viral mutation(s) within the areas targeted by this assay, and inadequate number of viral copies(<138 copies/mL). A negative result must be combined with clinical observations, patient history, and epidemiological information. The expected result is Negative.  Fact Sheet for Patients:  EntrepreneurPulse.com.au  Fact Sheet for Healthcare Providers:  IncredibleEmployment.be  This test is no t yet approved or cleared by the [place] FDA and  has been authorized for detection and/or diagnosis of SARS-CoV-2 by FDA under an Emergency Use Authorization (EUA). This EUA will remain  in effect (meaning this test  can be used) for the duration of the COVID-19 declaration under Section 564(b)(1) of the Act, 21 U.S.C.section 360bbb-3(b)(1), unless the authorization is terminated  or revoked sooner.       Influenza A by PCR NEGATIVE NEGATIVE Final   Influenza B by PCR NEGATIVE NEGATIVE  Final    Comment: (NOTE) The Xpert Xpress SARS-CoV-2/FLU/RSV plus assay is intended as an aid in the diagnosis of influenza from Nasopharyngeal swab specimens and should not be used as a sole basis for treatment. Nasal washings and aspirates are unacceptable for Xpert Xpress SARS-CoV-2/FLU/RSV testing.  Fact Sheet for Patients: EntrepreneurPulse.com.au  Fact Sheet for Healthcare Providers: IncredibleEmployment.be  This test is not yet approved or cleared by the [place] FDA and has been authorized for detection and/or diagnosis of SARS-CoV-2 by FDA under an Emergency Use Authorization (EUA). This EUA will remain in effect (meaning this test can be used) for the duration of the COVID-19 declaration under Section 564(b)(1) of the Act, 21 U.S.C. section 360bbb-3(b)(1), unless the authorization is terminated or revoked.  Performed at [facility], [place], [place] [address]   SARS CORONAVIRUS 2 (TAT 6-24 HRS) Nasopharyngeal Nasopharyngeal Swab     Status: None   Collection Time: 01/06/21  6:20 PM   Specimen: Nasopharyngeal Swab  Result Value Ref Range Status   SARS Coronavirus 2 NEGATIVE NEGATIVE Final    Comment: (NOTE) SARS-CoV-2 target nucleic acids are NOT DETECTED.  The SARS-CoV-2 RNA is generally detectable in upper and lower respiratory specimens during the acute phase of infection. Negative results do not preclude SARS-CoV-2 infection, do not rule out co-infections with other pathogens, and should not be used as the sole basis for treatment or other patient management decisions. Negative results must be combined with clinical observations, patient history, and epidemiological information. The expected result is Negative.  Fact Sheet for Patients: SugarRoll.be  Fact Sheet for Healthcare Providers: https://www.woods-mathews.com/  This test is not yet approved or  cleared by the [place] FDA and  has been authorized for detection and/or diagnosis of SARS-CoV-2 by FDA under an Emergency Use Authorization (EUA). This EUA will remain  in effect (meaning this test can be used) for the duration of the COVID-19 declaration under Se ction 564(b)(1) of the Act, 21 U.S.C. section 360bbb-3(b)(1), unless the authorization is terminated or revoked sooner.  Performed at [facility], [place] [address], [place], [place] [address]      Labs: BNP (last 3 results) No results for input(s): BNP in the last 8760 hours. Basic Metabolic Panel: Recent Labs  Lab 01/03/21 1250 01/05/21 1800 01/06/21 1100 01/06/21 1923 01/07/21 0445 01/08/21 0509  NA  --  126* 127* 129* 128* 131*  K  --  3.0* 3.1* 3.1* 3.3* 3.6  CL  --  92* 91* 97* 95* 99  CO2  --  '27 27 25 24 25  '$ GLUCOSE  --  155* 185* 70 102* 128*  BUN  --  60* 59* 41* 41* 37*  CREATININE  --  5.16* 4.90* 3.70* 3.66* 3.40*  CALCIUM  --  8.1* 8.1* 8.0* 8.0* 8.0*  MG 1.7  --   --   --   --   --   PHOS  --  4.8*  5.2* 5.4* 4.1  --   --    Liver Function Tests: Recent Labs  Lab 01/05/21 1800 01/06/21 1100 01/06/21 1923  ALBUMIN 1.9* 1.9* 2.0*   No results for input(s): LIPASE, AMYLASE in the last  168 hours. No results for input(s): AMMONIA in the last 168 hours. CBC: Recent Labs  Lab 01/06/21 0628 01/06/21 1923 01/07/21 0445  WBC 7.2 8.6 8.3  HGB 10.5* 9.4* 9.7*  HCT 31.6* 28.6* 28.9*  MCV 94.6 94.1 93.8  PLT 184 189 131*   Cardiac Enzymes: No results for input(s): CKTOTAL, CKMB, CKMBINDEX, TROPONINI in the last 168 hours. BNP: Invalid input(s): POCBNP CBG: Recent Labs  Lab 01/08/21 1617 01/08/21 2226 01/08/21 2327 01/09/21 0117 01/09/21 0743  GLUCAP 143* 75 130* 263* 218*   D-Dimer No results for input(s): DDIMER in the last 72 hours. Hgb A1c No results for input(s): HGBA1C in the last 72 hours. Lipid Profile No results for input(s): CHOL, HDL, LDLCALC, TRIG, CHOLHDL,  LDLDIRECT in the last 72 hours. Thyroid function studies No results for input(s): TSH, T4TOTAL, T3FREE, THYROIDAB in the last 72 hours.  Invalid input(s): FREET3 Anemia work up No results for input(s): VITAMINB12, FOLATE, FERRITIN, TIBC, IRON, RETICCTPCT in the last 72 hours. Urinalysis    Component Value Date/Time   COLORURINE YELLOW 05/08/2015 1625   APPEARANCEUR Cloudy (A) 05/08/2015 1625   LABSPEC 1.020 05/08/2015 1625   PHURINE 6.5 05/08/2015 1625   GLUCOSEU NEGATIVE 05/08/2015 1625   HGBUR SMALL (A) 05/08/2015 1625   BILIRUBINUR NEGATIVE 05/08/2015 1625   BILIRUBINUR neg 12/05/2012 1529   KETONESUR NEGATIVE 05/08/2015 1625   PROTEINUR neg 12/05/2012 1529   UROBILINOGEN 0.2 05/08/2015 1625   NITRITE NEGATIVE 05/08/2015 1625   LEUKOCYTESUR NEGATIVE 05/08/2015 1625   Sepsis Labs Invalid input(s): PROCALCITONIN,  WBC,  LACTICIDVEN Microbiology Recent Results (from the past 240 hour(s))  Resp Panel by RT-PCR (Flu A&B, Covid) Nasopharyngeal Swab     Status: None   Collection Time: 12/31/20  6:39 PM   Specimen: Nasopharyngeal Swab; Nasopharyngeal(NP) swabs in vial transport medium  Result Value Ref Range Status   SARS Coronavirus 2 by RT PCR NEGATIVE NEGATIVE Final    Comment: (NOTE) SARS-CoV-2 target nucleic acids are NOT DETECTED.  The SARS-CoV-2 RNA is generally detectable in upper respiratory specimens during the acute phase of infection. The lowest concentration of SARS-CoV-2 viral copies this assay can detect is 138 copies/mL. A negative result does not preclude SARS-Cov-2 infection and should not be used as the sole basis for treatment or other patient management decisions. A negative result may occur with  improper specimen collection/handling, submission of specimen other than nasopharyngeal swab, presence of viral mutation(s) within the areas targeted by this assay, and inadequate number of viral copies(<138 copies/mL). A negative result must be combined  with clinical observations, patient history, and epidemiological information. The expected result is Negative.  Fact Sheet for Patients:  EntrepreneurPulse.com.au  Fact Sheet for Healthcare Providers:  IncredibleEmployment.be  This test is no t yet approved or cleared by the [place] FDA and  has been authorized for detection and/or diagnosis of SARS-CoV-2 by FDA under an Emergency Use Authorization (EUA). This EUA will remain  in effect (meaning this test can be used) for the duration of the COVID-19 declaration under Section 564(b)(1) of the Act, 21 U.S.C.section 360bbb-3(b)(1), unless the authorization is terminated  or revoked sooner.       Influenza A by PCR NEGATIVE NEGATIVE Final   Influenza B by PCR NEGATIVE NEGATIVE Final    Comment: (NOTE) The Xpert Xpress SARS-CoV-2/FLU/RSV plus assay is intended as an aid in the diagnosis of influenza from Nasopharyngeal swab specimens and should not be used as a sole basis for treatment. Nasal washings and  aspirates are unacceptable for Xpert Xpress SARS-CoV-2/FLU/RSV testing.  Fact Sheet for Patients: EntrepreneurPulse.com.au  Fact Sheet for Healthcare Providers: IncredibleEmployment.be  This test is not yet approved or cleared by the [place] FDA and has been authorized for detection and/or diagnosis of SARS-CoV-2 by FDA under an Emergency Use Authorization (EUA). This EUA will remain in effect (meaning this test can be used) for the duration of the COVID-19 declaration under Section 564(b)(1) of the Act, 21 U.S.C. section 360bbb-3(b)(1), unless the authorization is terminated or revoked.  Performed at [facility], [place], [place] [address]   SARS CORONAVIRUS 2 (TAT 6-24 HRS) Nasopharyngeal Nasopharyngeal Swab     Status: None   Collection Time: 01/06/21  6:20 PM   Specimen: Nasopharyngeal Swab  Result Value Ref  Range Status   SARS Coronavirus 2 NEGATIVE NEGATIVE Final    Comment: (NOTE) SARS-CoV-2 target nucleic acids are NOT DETECTED.  The SARS-CoV-2 RNA is generally detectable in upper and lower respiratory specimens during the acute phase of infection. Negative results do not preclude SARS-CoV-2 infection, do not rule out co-infections with other pathogens, and should not be used as the sole basis for treatment or other patient management decisions. Negative results must be combined with clinical observations, patient history, and epidemiological information. The expected result is Negative.  Fact Sheet for Patients: SugarRoll.be  Fact Sheet for Healthcare Providers: https://www.woods-mathews.com/  This test is not yet approved or cleared by the [place] FDA and  has been authorized for detection and/or diagnosis of SARS-CoV-2 by FDA under an Emergency Use Authorization (EUA). This EUA will remain  in effect (meaning this test can be used) for the duration of the COVID-19 declaration under Se ction 564(b)(1) of the Act, 21 U.S.C. section 360bbb-3(b)(1), unless the authorization is terminated or revoked sooner.  Performed at [facility], [place] [address] [place], [place] [address]      Time coordinating discharge: Over 30 minutes  SIGNED:   Ezekiel Slocumb, DO [facility] 01/09/2021, 10:17 AM   If 7PM-7AM, please contact night-coverage [email or web address]

## 2021-01-13 DIAGNOSIS — N2589 Other disorders resulting from impaired renal tubular function: Secondary | ICD-10-CM | POA: Diagnosis not present

## 2021-01-13 DIAGNOSIS — Z992 Dependence on renal dialysis: Secondary | ICD-10-CM | POA: Diagnosis not present

## 2021-01-13 DIAGNOSIS — D631 Anemia in chronic kidney disease: Secondary | ICD-10-CM | POA: Diagnosis not present

## 2021-01-13 DIAGNOSIS — N2581 Secondary hyperparathyroidism of renal origin: Secondary | ICD-10-CM | POA: Diagnosis not present

## 2021-01-13 DIAGNOSIS — R82998 Other abnormal findings in urine: Secondary | ICD-10-CM | POA: Diagnosis not present

## 2021-01-13 DIAGNOSIS — D509 Iron deficiency anemia, unspecified: Secondary | ICD-10-CM | POA: Diagnosis not present

## 2021-01-13 DIAGNOSIS — N186 End stage renal disease: Secondary | ICD-10-CM | POA: Diagnosis not present

## 2021-01-13 DIAGNOSIS — E875 Hyperkalemia: Secondary | ICD-10-CM | POA: Diagnosis not present

## 2021-01-13 DIAGNOSIS — E1129 Type 2 diabetes mellitus with other diabetic kidney complication: Secondary | ICD-10-CM | POA: Diagnosis not present

## 2021-01-13 DIAGNOSIS — Z23 Encounter for immunization: Secondary | ICD-10-CM | POA: Diagnosis not present

## 2021-01-14 DIAGNOSIS — M25552 Pain in left hip: Secondary | ICD-10-CM | POA: Diagnosis not present

## 2021-01-14 DIAGNOSIS — E1165 Type 2 diabetes mellitus with hyperglycemia: Secondary | ICD-10-CM | POA: Diagnosis not present

## 2021-01-15 DIAGNOSIS — F17210 Nicotine dependence, cigarettes, uncomplicated: Secondary | ICD-10-CM | POA: Diagnosis not present

## 2021-01-15 DIAGNOSIS — N183 Chronic kidney disease, stage 3 unspecified: Secondary | ICD-10-CM | POA: Diagnosis not present

## 2021-01-15 DIAGNOSIS — Z9181 History of falling: Secondary | ICD-10-CM | POA: Diagnosis not present

## 2021-01-15 DIAGNOSIS — E1122 Type 2 diabetes mellitus with diabetic chronic kidney disease: Secondary | ICD-10-CM | POA: Diagnosis not present

## 2021-01-15 DIAGNOSIS — J449 Chronic obstructive pulmonary disease, unspecified: Secondary | ICD-10-CM | POA: Diagnosis not present

## 2021-01-15 DIAGNOSIS — Z96653 Presence of artificial knee joint, bilateral: Secondary | ICD-10-CM | POA: Diagnosis not present

## 2021-01-15 DIAGNOSIS — E1151 Type 2 diabetes mellitus with diabetic peripheral angiopathy without gangrene: Secondary | ICD-10-CM | POA: Diagnosis not present

## 2021-01-15 DIAGNOSIS — K589 Irritable bowel syndrome without diarrhea: Secondary | ICD-10-CM | POA: Diagnosis not present

## 2021-01-15 DIAGNOSIS — S72002D Fracture of unspecified part of neck of left femur, subsequent encounter for closed fracture with routine healing: Secondary | ICD-10-CM | POA: Diagnosis not present

## 2021-01-15 DIAGNOSIS — I2510 Atherosclerotic heart disease of native coronary artery without angina pectoris: Secondary | ICD-10-CM | POA: Diagnosis not present

## 2021-01-15 DIAGNOSIS — F32A Depression, unspecified: Secondary | ICD-10-CM | POA: Diagnosis not present

## 2021-01-15 DIAGNOSIS — G4700 Insomnia, unspecified: Secondary | ICD-10-CM | POA: Diagnosis not present

## 2021-01-15 DIAGNOSIS — I129 Hypertensive chronic kidney disease with stage 1 through stage 4 chronic kidney disease, or unspecified chronic kidney disease: Secondary | ICD-10-CM | POA: Diagnosis not present

## 2021-01-15 DIAGNOSIS — Z79891 Long term (current) use of opiate analgesic: Secondary | ICD-10-CM | POA: Diagnosis not present

## 2021-01-20 DIAGNOSIS — Z96651 Presence of right artificial knee joint: Secondary | ICD-10-CM | POA: Diagnosis not present

## 2021-01-20 DIAGNOSIS — Z96652 Presence of left artificial knee joint: Secondary | ICD-10-CM | POA: Diagnosis not present

## 2021-01-20 DIAGNOSIS — S72002A Fracture of unspecified part of neck of left femur, initial encounter for closed fracture: Secondary | ICD-10-CM | POA: Diagnosis not present

## 2021-01-22 ENCOUNTER — Telehealth (INDEPENDENT_AMBULATORY_CARE_PROVIDER_SITE_OTHER): Payer: Self-pay

## 2021-01-22 NOTE — Telephone Encounter (Signed)
Spoke with the patient and she is scheduled with Dr. Lucky Cowboy for a permcath removal on 02/05/21 with a 10:45 am at the [facility]. Covid testing is on 02/03/21 between 8-2 pm at the [facility]. Pre-procedure instructions were discussed and will be mailed. The patient was offered 01/29/21 but declined.

## 2021-01-23 DIAGNOSIS — K589 Irritable bowel syndrome without diarrhea: Secondary | ICD-10-CM | POA: Diagnosis not present

## 2021-01-23 DIAGNOSIS — S72002D Fracture of unspecified part of neck of left femur, subsequent encounter for closed fracture with routine healing: Secondary | ICD-10-CM | POA: Diagnosis not present

## 2021-01-23 DIAGNOSIS — Z9181 History of falling: Secondary | ICD-10-CM | POA: Diagnosis not present

## 2021-01-23 DIAGNOSIS — F32A Depression, unspecified: Secondary | ICD-10-CM | POA: Diagnosis not present

## 2021-01-23 DIAGNOSIS — J449 Chronic obstructive pulmonary disease, unspecified: Secondary | ICD-10-CM | POA: Diagnosis not present

## 2021-01-23 DIAGNOSIS — N183 Chronic kidney disease, stage 3 unspecified: Secondary | ICD-10-CM | POA: Diagnosis not present

## 2021-01-23 DIAGNOSIS — G4700 Insomnia, unspecified: Secondary | ICD-10-CM | POA: Diagnosis not present

## 2021-01-23 DIAGNOSIS — Z96653 Presence of artificial knee joint, bilateral: Secondary | ICD-10-CM | POA: Diagnosis not present

## 2021-01-23 DIAGNOSIS — E1151 Type 2 diabetes mellitus with diabetic peripheral angiopathy without gangrene: Secondary | ICD-10-CM | POA: Diagnosis not present

## 2021-01-23 DIAGNOSIS — F17210 Nicotine dependence, cigarettes, uncomplicated: Secondary | ICD-10-CM | POA: Diagnosis not present

## 2021-01-23 DIAGNOSIS — Z79891 Long term (current) use of opiate analgesic: Secondary | ICD-10-CM | POA: Diagnosis not present

## 2021-01-23 DIAGNOSIS — I129 Hypertensive chronic kidney disease with stage 1 through stage 4 chronic kidney disease, or unspecified chronic kidney disease: Secondary | ICD-10-CM | POA: Diagnosis not present

## 2021-01-23 DIAGNOSIS — I2510 Atherosclerotic heart disease of native coronary artery without angina pectoris: Secondary | ICD-10-CM | POA: Diagnosis not present

## 2021-01-23 DIAGNOSIS — E1122 Type 2 diabetes mellitus with diabetic chronic kidney disease: Secondary | ICD-10-CM | POA: Diagnosis not present

## 2021-01-29 NOTE — Telephone Encounter (Signed)
A call was received from patient's daughter stating the patient had fallen and broken her hip and could she do her covid test on 02/05/21 two hours before her procedure instead of on 02/03/21. I advised as long as pre-admit was okay with it, it was fine.

## 2021-02-03 ENCOUNTER — Other Ambulatory Visit: Payer: Self-pay

## 2021-02-03 ENCOUNTER — Other Ambulatory Visit
Admission: RE | Admit: 2021-02-03 | Discharge: 2021-02-03 | Disposition: A | Discharge status: Home / Self Care | Payer: PPO | Source: Ambulatory Visit | Attending: Vascular Surgery | Admitting: Vascular Surgery

## 2021-02-03 DIAGNOSIS — Z20822 Contact with and (suspected) exposure to covid-19: Secondary | ICD-10-CM | POA: Diagnosis not present

## 2021-02-03 DIAGNOSIS — Z01812 Encounter for preprocedural laboratory examination: Secondary | ICD-10-CM | POA: Insufficient documentation

## 2021-02-03 LAB — SARS CORONAVIRUS 2 (TAT 6-24 HRS): SARS Coronavirus 2: NEGATIVE

## 2021-02-05 ENCOUNTER — Other Ambulatory Visit (INDEPENDENT_AMBULATORY_CARE_PROVIDER_SITE_OTHER): Payer: Self-pay | Admitting: Nurse Practitioner

## 2021-02-05 ENCOUNTER — Encounter
Admission: RE | Disposition: A | Discharge status: Home / Self Care | Payer: Self-pay | Source: Home / Self Care | Attending: Vascular Surgery

## 2021-02-05 ENCOUNTER — Ambulatory Visit
Admission: RE | Admit: 2021-02-05 | Discharge: 2021-02-05 | Disposition: A | Discharge status: Home / Self Care | Payer: PPO | Attending: Vascular Surgery | Admitting: Vascular Surgery

## 2021-02-05 ENCOUNTER — Other Ambulatory Visit: Payer: Self-pay

## 2021-02-05 ENCOUNTER — Encounter: Payer: Self-pay | Admitting: Vascular Surgery

## 2021-02-05 DIAGNOSIS — Z9049 Acquired absence of other specified parts of digestive tract: Secondary | ICD-10-CM | POA: Insufficient documentation

## 2021-02-05 DIAGNOSIS — Z886 Allergy status to analgesic agent status: Secondary | ICD-10-CM | POA: Insufficient documentation

## 2021-02-05 DIAGNOSIS — E11649 Type 2 diabetes mellitus with hypoglycemia without coma: Secondary | ICD-10-CM | POA: Insufficient documentation

## 2021-02-05 DIAGNOSIS — Z90710 Acquired absence of both cervix and uterus: Secondary | ICD-10-CM | POA: Diagnosis not present

## 2021-02-05 DIAGNOSIS — N186 End stage renal disease: Secondary | ICD-10-CM | POA: Diagnosis not present

## 2021-02-05 DIAGNOSIS — Z880 Allergy status to penicillin: Secondary | ICD-10-CM | POA: Insufficient documentation

## 2021-02-05 DIAGNOSIS — Z885 Allergy status to narcotic agent status: Secondary | ICD-10-CM | POA: Insufficient documentation

## 2021-02-05 DIAGNOSIS — Z888 Allergy status to other drugs, medicaments and biological substances status: Secondary | ICD-10-CM | POA: Diagnosis not present

## 2021-02-05 DIAGNOSIS — Z881 Allergy status to other antibiotic agents status: Secondary | ICD-10-CM | POA: Insufficient documentation

## 2021-02-05 DIAGNOSIS — F17210 Nicotine dependence, cigarettes, uncomplicated: Secondary | ICD-10-CM | POA: Diagnosis not present

## 2021-02-05 DIAGNOSIS — Z8719 Personal history of other diseases of the digestive system: Secondary | ICD-10-CM | POA: Insufficient documentation

## 2021-02-05 DIAGNOSIS — Z96659 Presence of unspecified artificial knee joint: Secondary | ICD-10-CM | POA: Insufficient documentation

## 2021-02-05 DIAGNOSIS — N179 Acute kidney failure, unspecified: Secondary | ICD-10-CM | POA: Diagnosis not present

## 2021-02-05 DIAGNOSIS — E669 Obesity, unspecified: Secondary | ICD-10-CM | POA: Diagnosis not present

## 2021-02-05 DIAGNOSIS — Z8249 Family history of ischemic heart disease and other diseases of the circulatory system: Secondary | ICD-10-CM | POA: Diagnosis not present

## 2021-02-05 DIAGNOSIS — I120 Hypertensive chronic kidney disease with stage 5 chronic kidney disease or end stage renal disease: Secondary | ICD-10-CM | POA: Diagnosis not present

## 2021-02-05 DIAGNOSIS — Z6834 Body mass index (BMI) 34.0-34.9, adult: Secondary | ICD-10-CM | POA: Insufficient documentation

## 2021-02-05 DIAGNOSIS — Z4901 Encounter for fitting and adjustment of extracorporeal dialysis catheter: Secondary | ICD-10-CM | POA: Diagnosis not present

## 2021-02-05 DIAGNOSIS — E1122 Type 2 diabetes mellitus with diabetic chronic kidney disease: Secondary | ICD-10-CM | POA: Insufficient documentation

## 2021-02-05 DIAGNOSIS — Z905 Acquired absence of kidney: Secondary | ICD-10-CM | POA: Insufficient documentation

## 2021-02-05 DIAGNOSIS — Z833 Family history of diabetes mellitus: Secondary | ICD-10-CM | POA: Diagnosis not present

## 2021-02-05 DIAGNOSIS — Z992 Dependence on renal dialysis: Secondary | ICD-10-CM | POA: Diagnosis not present

## 2021-02-05 HISTORY — PX: DIALYSIS/PERMA CATHETER REMOVAL: CATH118289

## 2021-02-05 SURGERY — DIALYSIS/PERMA CATHETER REMOVAL
Anesthesia: LOCAL

## 2021-02-05 MED ORDER — LIDOCAINE HCL (PF) 1 % IJ SOLN
INTRAMUSCULAR | Status: DC | PRN
  Administered 2021-02-05: 5 mL

## 2021-02-05 SURGICAL SUPPLY — 2 items
FORCEPS HALSTEAD CVD 5IN STRL (INSTRUMENTS) ×1 IMPLANT
TRAY LACERAT/PLASTIC (MISCELLANEOUS) ×1 IMPLANT

## 2021-02-05 NOTE — Op Note (Signed)
Operative Note   Preoperative diagnosis:    1. ESRD with functional permanent access  Postoperative diagnosis:   1. ESRD with functional permanent access  Procedure:  Removal of Right Permcath  Physician Assistant: Hezzie Bump PA-C  Surgeon:  Leotis Pain, MD  Anesthesia:  Local  EBL:  Minimal  Indication for the Procedure:  The patient has a functional permanent dialysis access and no longer needs their permcath.  This can be removed.  Risks and benefits are discussed and informed consent is obtained.  Description of the Procedure:  The patient's right neck, chest and existing catheter were sterilely prepped and draped. The area around the catheter was anesthetized copiously with 1% lidocaine. The catheter was dissected out with curved hemostats until the cuff was freed from the surrounding fibrous sheath. The fiber sheath was transected, and the catheter was then removed in its entirety using gentle traction. Pressure was held and sterile dressings were placed. The patient tolerated the procedure well and was taken to the recovery room in stable condition.  Akili Corsetti A Imanni Burdine  02/05/2021, 12:11 PM  This note was created with Dragon Medical transcription system. Any errors in dictation are purely unintentional.

## 2021-02-05 NOTE — Discharge Instructions (Signed)
Incision Care, Adult An incision is a cut that a doctor makes in your skin for surgery. Most times, these cuts are closed after surgery. Your cut from surgery may be closed with: Stitches (sutures). Staples. Skin glue. Skin tape (adhesive) strips. You may need to return to your doctor to have stitches or staples taken out. This may happen many days or many weeks after your surgery. You need to take good care of your cut so it does not get infected. Follow instructions from your doctor about how to care for your cut. Supplies needed: Soap and water. A clean hand towel. Wound cleanser. A clean bandage (dressing), if needed. Cream or ointment, if told by your doctor. Clean gauze. How to care for your cut from surgery Cleaning your cut Ask your doctor how to clean your cut. You may need to: Use mild soap and water, or a wound cleanser. Use a clean gauze to pat your cut dry after you clean it. Changing your bandage Wash your hands with soap and water for at least 20 seconds before and after you change your bandage. If you cannot use soap and water, use hand sanitizer. Change your bandage as told by your doctor. Leave stitches, staples, skin glue, or skin tape strips in place. They may need to stay in place for 2 weeks or longer. If tape strips get loose and curl up, you may trim the loose edges. Do not remove tape strips completely unless your doctor says it is okay. Put a cream or ointment on your cut. Do this only as told. Cover your cut with a clean bandage. Ask your doctor when you can leave your cut uncovered. Checking for infection Check your cut area every day for signs of infection. Check for: More redness, swelling, or pain. More fluid or blood. Warmth. Pus or a bad smell.   Follow these instructions at home Medicines Take over-the-counter and prescription medicines only as told by your doctor. If you were prescribed an antibiotic medicine, cream, or ointment, use it as told by  your doctor. Do not stop using the antibiotic even if your condition improves. Eating and drinking Eat foods that have a lot of certain nutrients, such as protein, vitamin A, and vitamin C. These foods help your cut heal. Foods rich in protein include meat, fish, eggs, dairy, beans, and nuts. Foods rich in vitamin A include carrots and dark green, leafy vegetables. Foods rich in vitamin C include citrus fruits, tomatoes, broccoli, and peppers. Drink enough fluid to keep your pee (urine) pale yellow. General instructions Do not take baths, swim, use a hot tub, or put your cut underwater until your doctor approves. Ask your doctor if you may take showers. You may only be allowed to take sponge baths. Limit movement around your cut. This helps with healing. Try not to strain, lift, or exercise for the first 2 weeks, or for as long as told by your doctor. Return to your normal activities as told by your doctor. Ask your doctor what activities are safe for you. Do not scratch or pick at your cut. Keep it covered as told by your doctor. Protect your cut from the sun when you are outside for the first 6 months, or for as long as told by your doctor. Cover up the scar area or put on sunscreen that has an SPF of at least 30. Do not use any products that contain nicotine or tobacco, such as cigarettes, e-cigarettes, and chewing tobacco. These can delay  cut healing after surgery. If you need help quitting, ask your doctor. Keep all follow-up visits as told by your doctor. This is important.   Contact a doctor if: You have any of these signs of infection around your cut: More redness, swelling, or pain. More fluid or blood. Warmth. Pus or a bad smell. You have a fever. You feel like you may vomit (nauseous). You vomit. You are dizzy. Your stitches, staples, skin glue, or tape strips come undone. Get help right away if: Your cut has a red streak coming from it. Your cut bleeds through your bandage,  and bleeding does not stop with gentle pressure. Your cut opens up and comes apart. Your body reacts very badly to an infection. This may include: A fever, chills, or feeling cold. Feeling mixed up, worried, or nervous. Very bad pain. Trouble breathing. A fast heartbeat. Clammy or sweaty skin. A rash. These symptoms may be an emergency. Do not wait to see if the symptoms will go away. Get medical help right away. Call your local emergency services (911 in the [place].S[place] Do not drive yourself to the hospital. Summary Follow instructions from your doctor about how to care for your cut from surgery. Wash your hands with soap and water for at least 20 seconds before and after you change your bandage. If you cannot use soap and water, use hand sanitizer. Check your cut area every day for signs of infection. Keep all follow-up visits as told by your doctor. This is important. This information is not intended to replace advice given to you by your health care provider. Make sure you discuss any questions you have with your health care provider. Document Revised: 08/22/2019 Document Reviewed: 08/22/2019 Elsevier Patient Education  [place].

## 2021-02-05 NOTE — H&P (Signed)
[facility] Admission History & Physical  MRN : [id number]  Andrea Stewart is a 75 y.o. ([birth year]) female who presents with chief complaint of scheduled PermCath removal.  History of Present Illness:  I am asked to evaluate the patient by the dialysis center. The patient was sent here because they have a functional peritoneal dialysis catheter.  The patient underwent PermCath placement a few weeks ago status post ORIF of the hip.  At this point the patient is able to use her peritoneal dialysis catheter without issue. The patient reports they're not been any problems with any of their dialysis runs. They are reporting good flows with good parameters at dialysis.  Patient denies fever, nausea or vomiting.  No current facility-administered medications for this encounter.   Past Medical History:  Diagnosis Date  . Backache, unspecified    s/p c-spine and l-spine fusions  . COPD (chronic obstructive pulmonary disease) ([place])   . Degeneration of intervertebral disc, site unspecified   . Depressive disorder, not elsewhere classified   . Disorder of bone and cartilage, unspecified   . Family history of adverse reaction to anesthesia    mother did, but patient not sure of the complication  . HTN (hypertension)   . Hx of left heart catheterization by cutdown    a. 7 years ago; no significant cad; b. Lexiscan 2014: no significant ischemia, no significant EKG changes concerning for ischemia, EF 67%, overall low risk study  . Kidney failure    Dr Cyndia Diver  . Myalgia and myositis, unspecified   . Obesity, unspecified   . Personal history of tobacco use, presenting hazards to health    1/2 ppd  . Plantar fascial fibromatosis   . S/P cholecystectomy   . S/p nephrectomy   . Type II or unspecified type diabetes mellitus without mention of complication, not stated as uncontrolled   . Unspecified essential hypertension   . Unspecified hereditary and idiopathic  peripheral neuropathy    secondary to diabetes   Past Surgical History:  Procedure Laterality Date  . ABDOMINAL HYSTERECTOMY    . APPENDECTOMY    . BACK SURGERY     1966 and 2006   . CARDIAC CATHETERIZATION     o.k. 2003  . CARPAL TUNNEL RELEASE    . CERVICAL DISCECTOMY    . CHOLECYSTECTOMY    . COLONOSCOPY  09/1999   colonoscopy-hemorrhoids, re-check 5 years (10/2006)  . dexa     osteopenia (01/2004)  . DIALYSIS/PERMA CATHETER INSERTION Right 01/05/2021   Procedure: DIALYSIS/PERMA CATHETER INSERTION;  Surgeon: Algernon Huxley, MD;  Location: [facility];  Service: Cardiovascular;  Laterality: Right;  . ELBOW SURGERY    . ESOPHAGOGASTRODUODENOSCOPY (EGD) WITH PROPOFOL N/A 07/23/2020   Procedure: ESOPHAGOGASTRODUODENOSCOPY (EGD) WITH PROPOFOL;  Surgeon: Robert Bellow, MD;  Location: [facility];  Service: Endoscopy;  Laterality: N/A;  TRAVEL TO O.R. - PATIENT TO HAVE SURGERY  . FEMUR IM NAIL Left 01/01/2021   Procedure: INTRAMEDULLARY (IM) NAIL FEMORAL;  Surgeon: Lovell Sheehan, MD;  Location: [facility];  Service: Orthopedics;  Laterality: Left;  . FOOT SURGERY     x 3  . JOINT REPLACEMENT    . KIDNEY DONATION  1991  . LAPAROSCOPIC ABDOMINAL EXPLORATION N/A 07/23/2020   Procedure: LAPAROSCOPIC ABDOMINAL EXPLORATION;  Surgeon: Robert Bellow, MD;  Location: [facility];  Service: General;  Laterality: N/A;  . LUMBAR FUSION    . NECK SURGERY     06/2005  .  Problem with general Anesthesia     02/2006  . REPLACEMENT TOTAL KNEE  10/2004  . trigger finger surgery    . UPPER GI ENDOSCOPY N/A 07/23/2020   Procedure: UPPER GI ENDOSCOPY;  Surgeon: Robert Bellow, MD;  Location: [facility];  Service: General;  Laterality: N/A;  . VENTRAL HERNIA REPAIR N/A 09/08/2017   Primary repair of a 10 x 15 mm infraumbilical fascial defect.  Surgeon: Robert Bellow, MD;  Location: [facility];  Service: General;  Laterality: N/A;  . VESICOVAGINAL FISTULA CLOSURE W/ TAH    . VIDEO  BRONCHOSCOPY Bilateral 04/02/2015   Procedure: VIDEO BRONCHOSCOPY WITHOUT FLUORO;  Surgeon: Juanito Doom, MD;  Location: [facility];  Service: Cardiopulmonary;  Laterality: Bilateral;   Social History Social History   Tobacco Use  . Smoking status: Current Every Day Smoker    Packs/day: 0.25    Years: 30.00    Pack years: 7.50    Types: Cigarettes  . Smokeless tobacco: Never Used  . Tobacco comment: currently smoking .25 ppd, trying to quit 02/13/16  Substance Use Topics  . Alcohol use: No    Alcohol/week: 0.0 standard drinks  . Drug use: No   Family History Family History  Problem Relation Age of Onset  . Hypertension Mother   . Colon cancer Mother   . Cancer Mother        colon  . Coronary artery disease Mother   . Diabetes Mother   . Prostate cancer Father   . COPD Father   . Cancer Father        prostate  . Heart attack Brother 61  . Sleep apnea Brother   . Diabetes Brother   . Diabetes Paternal Aunt   . Diabetes Paternal Uncle   . Cancer Daughter        Brain, lungs, liver, spine, kidney  No family history of bleeding or clotting disorders, autoimmune disease or porphyria.  Allergies  Allergen Reactions  . Morphine Anaphylaxis  . Prednisone Other (See Comments)    "makes me think I am having a heart attack"  . Gabapentin Other (See Comments)    intolerance  . Hydralazine Itching  . Nsaids Diarrhea  . Penicillins Swelling    TOLERATED CEFAZOLIN 07/2020 Has patient had a PCN reaction causing immediate rash, facial/tongue/throat swelling, SOB or lightheadedness with hypotension: Yes Has patient had a PCN reaction causing severe rash involving mucus membranes or skin necrosis: No Has patient had a PCN reaction that required hospitalization: No Has patient had a PCN reaction occurring within the last 10 years: No If all of the above answers are "NO", then may proceed with Cephalosporin use.    REVIEW OF SYSTEMS (Negative unless  checked)  Constitutional: '[]'$ Weight loss  '[]'$ Fever  '[]'$ Chills Cardiac: '[]'$ Chest pain   '[]'$ Chest pressure   '[]'$ Palpitations   '[]'$ Shortness of breath when laying flat   '[]'$ Shortness of breath at rest   '[x]'$ Shortness of breath with exertion. Vascular:  '[]'$ Pain in legs with walking   '[]'$ Pain in legs at rest   '[]'$ Pain in legs when laying flat   '[]'$ Claudication   '[]'$ Pain in feet when walking  '[]'$ Pain in feet at rest  '[]'$ Pain in feet when laying flat   '[]'$ History of DVT   '[]'$ Phlebitis   '[]'$ Swelling in legs   '[]'$ Varicose veins   '[]'$ Non-healing ulcers Pulmonary:   '[]'$ Uses home oxygen   '[]'$ Productive cough   '[]'$ Hemoptysis   '[]'$ Wheeze  '[]'$ COPD   '[]'$ Asthma Neurologic:  '[]'$ Dizziness  '[]'$ Blackouts   '[]'$   Seizures   '[]'$ History of stroke   '[]'$ History of TIA  '[]'$ Aphasia   '[]'$ Temporary blindness   '[]'$ Dysphagia   '[]'$ Weakness or numbness in arms   '[]'$ Weakness or numbness in legs Musculoskeletal:  '[]'$ Arthritis   '[]'$ Joint swelling   '[]'$ Joint pain   '[]'$ Low back pain Hematologic:  '[]'$ Easy bruising  '[]'$ Easy bleeding   '[]'$ Hypercoagulable state   '[]'$ Anemic  '[]'$ Hepatitis Gastrointestinal:  '[]'$ Blood in stool   '[]'$ Vomiting blood  '[]'$ Gastroesophageal reflux/heartburn   '[]'$ Difficulty swallowing. Genitourinary:  '[x]'$ Chronic kidney disease   '[]'$ Difficult urination  '[]'$ Frequent urination  '[]'$ Burning with urination   '[]'$ Blood in urine Skin:  '[]'$ Rashes   '[]'$ Ulcers   '[]'$ Wounds Psychological:  '[]'$ History of anxiety   '[]'$  History of major depression.  Physical Examination  Vitals:   02/05/21 1110  BP: 119/89  Pulse: 69  Resp: 16  Temp: 97.9 F (36.6 C)  TempSrc: Oral  SpO2: 95%  Weight: 79.4 kg  Height: 5' (1.524 m)   Body mass index is 34.18 kg/m. Gen: WD/WN, NAD Head: [place]/AT, No temporalis wasting. Prominent temp pulse not noted. Ear/Nose/Throat: Hearing grossly intact, nares w/o erythema or drainage, oropharynx w/o Erythema/Exudate,  Eyes: Conjunctiva clear, sclera non-icteric Neck: Trachea midline.  No JVD.  Pulmonary:  Good air movement, respirations not labored, no use of  accessory muscles.  Cardiac: RRR, normal S1, S2. Vascular:  Vessel Right Left  Radial Palpable Palpable  Ulnar Not Palpable Not Palpable  Brachial Palpable Palpable  Carotid Palpable, without bruit Palpable, without bruit   Right IJ PermCath: Intact.  No signs of infection.  Gastrointestinal: soft, non-tender/non-distended. No guarding/reflex.  Musculoskeletal: M/S 5/5 throughout.  Extremities without ischemic changes.  No deformity or atrophy.  Neurologic: Sensation grossly intact in extremities.  Symmetrical.  Speech is fluent. Motor exam as listed above. Psychiatric: Judgment intact, Mood & affect appropriate for pt's clinical situation. Dermatologic: No rashes or ulcers noted.  No cellulitis or open wounds. Lymph : No Cervical, Axillary, or Inguinal lymphadenopathy.  CBC Lab Results  Component Value Date   WBC 8.3 01/07/2021   HGB 9.7 (L) 01/07/2021   HCT 28.9 (L) 01/07/2021   MCV 93.8 01/07/2021   PLT 131 (L) 01/07/2021   BMET    Component Value Date/Time   NA 131 (L) 01/08/2021 0509   NA 139 02/20/2020 0000   K 3.6 01/08/2021 0509   CL 99 01/08/2021 0509   CO2 25 01/08/2021 0509   GLUCOSE 128 (H) 01/08/2021 0509   BUN 37 (H) 01/08/2021 0509   BUN 38 (A) 02/20/2020 0000   CREATININE 3.40 (H) 01/08/2021 0509   CREATININE 1.65 (H) 07/22/2017 1553   CALCIUM 8.0 (L) 01/08/2021 0509   GFRNONAA 14 (L) 01/08/2021 0509   GFRAA 10 (L) 07/18/2020 1118   CrCl cannot be calculated (Patient's most recent lab result is older than the maximum 21 days allowed.).  COAG Lab Results  Component Value Date   INR 1.01 03/27/2015   Radiology No results found.  Assessment/Plan 1. ESRD:  The patient has a functioning peritoneal dialysis catheter.  PermCath was inserted a few weeks ago status post ORIF of the hip. The patient will undergo removal of the tunneled catheter under local anesthesia.  The risks and benefits were described to the patient.  All questions were answered.   The patient agrees to proceed with removal of her PermCath. 2.  Hypertension:  Patient will continue medical management; nephrology is following no changes in oral medications. 3. Diabetes mellitus:  Glucose will be monitored and oral  medications been held this morning once the patient has undergone the patient's procedure po intake will be reinitiated and again Accu-Cheks will be used to assess the blood glucose level and treat as needed. The patient will be restarted on the patient's usual hypoglycemic regime  Discussed with Dr. Mayme Genta, PA-C  02/05/2021 11:51 AM

## 2021-02-12 DIAGNOSIS — Z992 Dependence on renal dialysis: Secondary | ICD-10-CM | POA: Diagnosis not present

## 2021-02-12 DIAGNOSIS — N186 End stage renal disease: Secondary | ICD-10-CM | POA: Diagnosis not present

## 2021-02-13 DIAGNOSIS — I2510 Atherosclerotic heart disease of native coronary artery without angina pectoris: Secondary | ICD-10-CM | POA: Diagnosis not present

## 2021-02-13 DIAGNOSIS — R82998 Other abnormal findings in urine: Secondary | ICD-10-CM | POA: Diagnosis not present

## 2021-02-13 DIAGNOSIS — E1151 Type 2 diabetes mellitus with diabetic peripheral angiopathy without gangrene: Secondary | ICD-10-CM | POA: Diagnosis not present

## 2021-02-13 DIAGNOSIS — J449 Chronic obstructive pulmonary disease, unspecified: Secondary | ICD-10-CM | POA: Diagnosis not present

## 2021-02-13 DIAGNOSIS — Z96653 Presence of artificial knee joint, bilateral: Secondary | ICD-10-CM | POA: Diagnosis not present

## 2021-02-13 DIAGNOSIS — N2589 Other disorders resulting from impaired renal tubular function: Secondary | ICD-10-CM | POA: Diagnosis not present

## 2021-02-13 DIAGNOSIS — F17210 Nicotine dependence, cigarettes, uncomplicated: Secondary | ICD-10-CM | POA: Diagnosis not present

## 2021-02-13 DIAGNOSIS — Z23 Encounter for immunization: Secondary | ICD-10-CM | POA: Diagnosis not present

## 2021-02-13 DIAGNOSIS — D509 Iron deficiency anemia, unspecified: Secondary | ICD-10-CM | POA: Diagnosis not present

## 2021-02-13 DIAGNOSIS — S72002D Fracture of unspecified part of neck of left femur, subsequent encounter for closed fracture with routine healing: Secondary | ICD-10-CM | POA: Diagnosis not present

## 2021-02-13 DIAGNOSIS — Z79891 Long term (current) use of opiate analgesic: Secondary | ICD-10-CM | POA: Diagnosis not present

## 2021-02-13 DIAGNOSIS — F32A Depression, unspecified: Secondary | ICD-10-CM | POA: Diagnosis not present

## 2021-02-13 DIAGNOSIS — E1122 Type 2 diabetes mellitus with diabetic chronic kidney disease: Secondary | ICD-10-CM | POA: Diagnosis not present

## 2021-02-13 DIAGNOSIS — Z992 Dependence on renal dialysis: Secondary | ICD-10-CM | POA: Diagnosis not present

## 2021-02-13 DIAGNOSIS — E440 Moderate protein-calorie malnutrition: Secondary | ICD-10-CM | POA: Diagnosis not present

## 2021-02-13 DIAGNOSIS — N186 End stage renal disease: Secondary | ICD-10-CM | POA: Diagnosis not present

## 2021-02-13 DIAGNOSIS — Z9181 History of falling: Secondary | ICD-10-CM | POA: Diagnosis not present

## 2021-02-13 DIAGNOSIS — G4700 Insomnia, unspecified: Secondary | ICD-10-CM | POA: Diagnosis not present

## 2021-02-13 DIAGNOSIS — N183 Chronic kidney disease, stage 3 unspecified: Secondary | ICD-10-CM | POA: Diagnosis not present

## 2021-02-13 DIAGNOSIS — D631 Anemia in chronic kidney disease: Secondary | ICD-10-CM | POA: Diagnosis not present

## 2021-02-13 DIAGNOSIS — N2581 Secondary hyperparathyroidism of renal origin: Secondary | ICD-10-CM | POA: Diagnosis not present

## 2021-02-13 DIAGNOSIS — Z79899 Other long term (current) drug therapy: Secondary | ICD-10-CM | POA: Diagnosis not present

## 2021-02-13 DIAGNOSIS — K589 Irritable bowel syndrome without diarrhea: Secondary | ICD-10-CM | POA: Diagnosis not present

## 2021-02-13 DIAGNOSIS — I129 Hypertensive chronic kidney disease with stage 1 through stage 4 chronic kidney disease, or unspecified chronic kidney disease: Secondary | ICD-10-CM | POA: Diagnosis not present

## 2021-02-19 DIAGNOSIS — E261 Secondary hyperaldosteronism: Secondary | ICD-10-CM | POA: Diagnosis not present

## 2021-02-19 DIAGNOSIS — K5900 Constipation, unspecified: Secondary | ICD-10-CM | POA: Diagnosis not present

## 2021-02-19 DIAGNOSIS — I132 Hypertensive heart and chronic kidney disease with heart failure and with stage 5 chronic kidney disease, or end stage renal disease: Secondary | ICD-10-CM | POA: Diagnosis not present

## 2021-02-19 DIAGNOSIS — J449 Chronic obstructive pulmonary disease, unspecified: Secondary | ICD-10-CM | POA: Diagnosis not present

## 2021-02-19 DIAGNOSIS — D692 Other nonthrombocytopenic purpura: Secondary | ICD-10-CM | POA: Diagnosis not present

## 2021-02-19 DIAGNOSIS — F331 Major depressive disorder, recurrent, moderate: Secondary | ICD-10-CM | POA: Diagnosis not present

## 2021-02-19 DIAGNOSIS — I509 Heart failure, unspecified: Secondary | ICD-10-CM | POA: Diagnosis not present

## 2021-02-19 DIAGNOSIS — E1165 Type 2 diabetes mellitus with hyperglycemia: Secondary | ICD-10-CM | POA: Diagnosis not present

## 2021-02-19 DIAGNOSIS — E1142 Type 2 diabetes mellitus with diabetic polyneuropathy: Secondary | ICD-10-CM | POA: Diagnosis not present

## 2021-02-19 DIAGNOSIS — E1151 Type 2 diabetes mellitus with diabetic peripheral angiopathy without gangrene: Secondary | ICD-10-CM | POA: Diagnosis not present

## 2021-02-19 DIAGNOSIS — E1122 Type 2 diabetes mellitus with diabetic chronic kidney disease: Secondary | ICD-10-CM | POA: Diagnosis not present

## 2021-02-23 ENCOUNTER — Other Ambulatory Visit (INDEPENDENT_AMBULATORY_CARE_PROVIDER_SITE_OTHER): Payer: Self-pay | Admitting: Nurse Practitioner

## 2021-02-23 DIAGNOSIS — I739 Peripheral vascular disease, unspecified: Secondary | ICD-10-CM

## 2021-02-23 DIAGNOSIS — E1165 Type 2 diabetes mellitus with hyperglycemia: Secondary | ICD-10-CM | POA: Diagnosis not present

## 2021-02-24 ENCOUNTER — Encounter (INDEPENDENT_AMBULATORY_CARE_PROVIDER_SITE_OTHER): Payer: Self-pay | Admitting: Vascular Surgery

## 2021-02-24 ENCOUNTER — Ambulatory Visit (INDEPENDENT_AMBULATORY_CARE_PROVIDER_SITE_OTHER): Payer: PPO

## 2021-02-24 ENCOUNTER — Other Ambulatory Visit: Payer: Self-pay

## 2021-02-24 ENCOUNTER — Ambulatory Visit (INDEPENDENT_AMBULATORY_CARE_PROVIDER_SITE_OTHER): Payer: PPO | Admitting: Vascular Surgery

## 2021-02-24 VITALS — BP 135/74 | HR 66 | Ht 59.0 in | Wt 175.0 lb

## 2021-02-24 DIAGNOSIS — N1832 Chronic kidney disease, stage 3b: Secondary | ICD-10-CM | POA: Diagnosis not present

## 2021-02-24 DIAGNOSIS — I739 Peripheral vascular disease, unspecified: Secondary | ICD-10-CM | POA: Diagnosis not present

## 2021-02-24 DIAGNOSIS — E118 Type 2 diabetes mellitus with unspecified complications: Secondary | ICD-10-CM | POA: Diagnosis not present

## 2021-02-24 DIAGNOSIS — I10 Essential (primary) hypertension: Secondary | ICD-10-CM | POA: Diagnosis not present

## 2021-02-24 DIAGNOSIS — IMO0002 Reserved for concepts with insufficient information to code with codable children: Secondary | ICD-10-CM

## 2021-02-24 DIAGNOSIS — R109 Unspecified abdominal pain: Secondary | ICD-10-CM | POA: Insufficient documentation

## 2021-02-24 DIAGNOSIS — E1165 Type 2 diabetes mellitus with hyperglycemia: Secondary | ICD-10-CM

## 2021-02-24 DIAGNOSIS — R1084 Generalized abdominal pain: Secondary | ICD-10-CM

## 2021-02-24 NOTE — Assessment & Plan Note (Signed)
The patient has significant postprandial abdominal pain and with her atherosclerotic history, I think it would be prudent to check a mesenteric duplex.  I have offered this to her at her convenience but she says she will call our office to schedule this.  She is tired and just wants to go home today.

## 2021-02-24 NOTE — Assessment & Plan Note (Signed)
Her ABIs today are 1.04 on the right and 0.80 on the left which are reasonably stable from her previous studies.  This is stable and can be checked in 1 year.

## 2021-02-24 NOTE — Progress Notes (Signed)
MRN : [id number]  Andrea Stewart is a 75 y.o. (Aug 30, 1946) female who presents with chief complaint of  Chief Complaint  Patient presents with  . Follow-up    6 Mo U/S  .  History of Present Illness: Patient returns today in follow up of multiple issues.  This was a regular follow-up visit for her peripheral arterial disease with last check about 6 months ago.  She is much more disabled by a recent hip fracture and disability resulting from that.  Her legs continue to swell.  That was complicated by acute renal failure and need for dialysis.  She had a PermCath in for about a month before it was later removed as her renal function returned.  She still has significant chronic kidney disease.  She denies any rest pain in her lower extremities and she is really not walking well enough to determine whether or not there is claudication.  Her biggest complaint now is of abdominal pain.  This is generally postprandial.  It involves diarrhea and constipation.  She reports no clear cause or inciting event from her abdominal pain.  This has been going on for several months.  Her PermCath removal site has healed well.  Her ABIs today are 1.04 on the right and 0.80 on the left which are reasonably stable from her previous studies.  Current Outpatient Medications  Medication Sig Dispense Refill  . acetaminophen (TYLENOL) 325 MG tablet Take 2 tablets (650 mg total) by mouth every 6 (six) hours as needed for mild pain or headache (headache).    . ALPRAZolam (XANAX) 0.5 MG tablet Take by mouth.    Marland Kitchen aspirin EC 81 MG EC tablet Take 1 tablet (81 mg total) by mouth 2 (two) times daily. Swallow whole. 30 tablet 11  . atorvastatin (LIPITOR) 20 MG tablet TAKE 1 TABLET BY MOUTH DAILY (Patient taking differently: Take 20 mg by mouth daily with supper.) 90 tablet 1  . budesonide-formoterol (SYMBICORT) 160-4.5 MCG/ACT inhaler Inhale into the lungs.    . calcium acetate (PHOSLO) 667 MG capsule Take 1,334 mg by mouth 2  (two) times daily.    . carvedilol (COREG) 25 MG tablet Take 25 mg by mouth 2 (two) times daily.    . Continuous Blood Gluc Receiver (DEXCOM G6 RECEIVER) [place] See admin instructions.    . furosemide (LASIX) 80 MG tablet Take 80 mg by mouth daily.    Marland Kitchen gentamicin cream (GARAMYCIN) 0.1 % Apply topically.    Marland Kitchen gentamicin ointment (GARAMYCIN) 0.1 % Apply topically.    Marland Kitchen HYDROcodone-acetaminophen (NORCO/VICODIN) 5-325 MG tablet hydrocodone 5 mg-acetaminophen 325 mg tablet    . hydrOXYzine (ATARAX/VISTARIL) 25 MG tablet Take 1 tablet (25 mg total) by mouth 2 (two) times daily as needed for anxiety. 30 tablet 0  . insulin aspart (NOVOLOG) 100 UNIT/ML injection Inject 5 Units into the skin 3 (three) times daily with meals. 10 mL 11  . insulin aspart (NOVOLOG) 100 UNIT/ML injection Inject 0-9 Units into the skin 4 (four) times daily -  before meals and at bedtime. According to sliding scale as follows: If CBG 70-120: 0 units If CBG 121-150: 1 unit If CBG 151-200: 2 units If CBG 201-250: 3 units If CBG 251-300: 5 units If CBG 301-350: 7 units If CBG 351-400: 9 units If CBG over 400: 12 units and call MD 10 mL 11  . insulin detemir (LEVEMIR) 100 UNIT/ML injection Inject 0.15 mLs (15 Units total) into the skin 2 (two) times daily  at 8 am and 10 pm. 10 mL 11  . insulin NPH Human (NOVOLIN N) 100 UNIT/ML injection Novolin N NPH U-100 Insulin isophane 100 unit/mL subcutaneous susp  INJECT 20 UNITS IN THE MORNING AND 35 UNITS IN THE EVENING. MAX DAILY DOSE OF 100 UNITS    . Magnesium 500 MG TABS 1 tablet with a meal    . multivitamin (RENA-VIT) TABS tablet Take 1 tablet by mouth daily.  0  . nicotine (NICODERM CQ - DOSED IN MG/24 HOURS) 21 mg/24hr patch Place 1 patch (21 mg total) onto the skin daily. 28 patch 0  . nitroGLYCERIN (NITROSTAT) 0.4 MG SL tablet Place 1 tablet (0.4 mg total) under the tongue every 5 (five) minutes as needed. Maximum 3 tablets (Patient taking differently: Place 0.4 mg under the  tongue every 5 (five) minutes x 3 doses as needed for chest pain. Maximum 3 tablets) 25 tablet 6  . Nutritional Supplements (,FEEDING SUPPLEMENT, PROSOURCE PLUS) liquid Take 30 mLs by mouth daily.    . Nutritional Supplements (FEEDING SUPPLEMENT, NEPRO CARB STEADY,) LIQD Take 237 mLs by mouth daily.  0  . ondansetron (ZOFRAN) 4 MG tablet Take 1 tablet (4 mg total) by mouth every 6 (six) hours as needed for nausea. 20 tablet 0  . pantoprazole (PROTONIX) 40 MG tablet Take 40 mg by mouth 2 (two) times daily.    . polyethylene glycol (MIRALAX / GLYCOLAX) 17 g packet Take 17 g by mouth daily. 14 each 0  . Probiotic Product (PROBIOTIC PO) Take 1 capsule by mouth daily.    . REGLAN 5 MG tablet Take 5 mg by mouth 5 (five) times daily.    Marland Kitchen senna-docusate (SENOKOT-S) 8.6-50 MG tablet Take 2 tablets by mouth 2 (two) times daily.    . Sennosides (SENNA) 8.6 MG CAPS Take 2 capsules by mouth at bedtime as needed.    . sevelamer carbonate (RENVELA) 800 MG tablet Take 1,600 mg by mouth 3 (three) times daily.    Marland Kitchen Spacer/Aero-Holding Chambers (AEROCHAMBER MV) inhaler Use as instructed 1 each 0  . traZODone (DESYREL) 50 MG tablet TAKE ONE-HALF TO ONE TABLET BY MOUTH AT BEDTIME AS NEEDED FOR SLEEP (Patient taking differently: Take 25-50 mg by mouth at bedtime as needed for sleep.) 30 tablet 3   Current Facility-Administered Medications  Medication Dose Route Frequency Provider Last Rate Last Admin  . albuterol (PROVENTIL HFA;VENTOLIN HFA) 108 (90 Base) MCG/ACT inhaler 2 puff  2 puff Inhalation Q4H PRN Byrnett, Forest Gleason, MD        Past Medical History:  Diagnosis Date  . Backache, unspecified    s/p c-spine and l-spine fusions  . COPD (chronic obstructive pulmonary disease) ([place])   . Degeneration of intervertebral disc, site unspecified   . Depressive disorder, not elsewhere classified   . Disorder of bone and cartilage, unspecified   . Family history of adverse reaction to anesthesia    mother did, but  patient not sure of the complication  . HTN (hypertension)   . Hx of left heart catheterization by cutdown    a. 7 years ago; no significant cad; b. Lexiscan 2014: no significant ischemia, no significant EKG changes concerning for ischemia, EF 67%, overall low risk study  . Kidney failure    Dr Cyndia Diver  . Myalgia and myositis, unspecified   . Obesity, unspecified   . Personal history of tobacco use, presenting hazards to health    1/2 ppd  . Plantar fascial fibromatosis   . S/P  cholecystectomy   . S/p nephrectomy   . Type II or unspecified type diabetes mellitus without mention of complication, not stated as uncontrolled   . Unspecified essential hypertension   . Unspecified hereditary and idiopathic peripheral neuropathy    secondary to diabetes    Past Surgical History:  Procedure Laterality Date  . ABDOMINAL HYSTERECTOMY    . APPENDECTOMY    . BACK SURGERY     1966 and 2006   . CARDIAC CATHETERIZATION     o.k. 2003  . CARPAL TUNNEL RELEASE    . CERVICAL DISCECTOMY    . CHOLECYSTECTOMY    . COLONOSCOPY  09/1999   colonoscopy-hemorrhoids, re-check 5 years (10/2006)  . dexa     osteopenia (01/2004)  . DIALYSIS/PERMA CATHETER INSERTION Right 01/05/2021   Procedure: DIALYSIS/PERMA CATHETER INSERTION;  Surgeon: Algernon Huxley, MD;  Location: [facility];  Service: Cardiovascular;  Laterality: Right;  . DIALYSIS/PERMA CATHETER REMOVAL N/A 02/05/2021   Procedure: DIALYSIS/PERMA CATHETER REMOVAL;  Surgeon: Algernon Huxley, MD;  Location: [facility];  Service: Cardiovascular;  Laterality: N/A;  . ELBOW SURGERY    . ESOPHAGOGASTRODUODENOSCOPY (EGD) WITH PROPOFOL N/A 07/23/2020   Procedure: ESOPHAGOGASTRODUODENOSCOPY (EGD) WITH PROPOFOL;  Surgeon: Robert Bellow, MD;  Location: [facility];  Service: Endoscopy;  Laterality: N/A;  TRAVEL TO O.R. - PATIENT TO HAVE SURGERY  . FEMUR IM NAIL Left 01/01/2021   Procedure: INTRAMEDULLARY (IM) NAIL FEMORAL;  Surgeon: Lovell Sheehan, MD;  Location: [facility];  Service: Orthopedics;  Laterality: Left;  . FOOT SURGERY     x 3  . JOINT REPLACEMENT    . KIDNEY DONATION  1991  . LAPAROSCOPIC ABDOMINAL EXPLORATION N/A 07/23/2020   Procedure: LAPAROSCOPIC ABDOMINAL EXPLORATION;  Surgeon: Robert Bellow, MD;  Location: [facility];  Service: General;  Laterality: N/A;  . LUMBAR FUSION    . NECK SURGERY     06/2005  . Problem with general Anesthesia     02/2006  . REPLACEMENT TOTAL KNEE  10/2004  . trigger finger surgery    . UPPER GI ENDOSCOPY N/A 07/23/2020   Procedure: UPPER GI ENDOSCOPY;  Surgeon: Robert Bellow, MD;  Location: [facility];  Service: General;  Laterality: N/A;  . VENTRAL HERNIA REPAIR N/A 09/08/2017   Primary repair of a 10 x 15 mm infraumbilical fascial defect.  Surgeon: Robert Bellow, MD;  Location: [facility];  Service: General;  Laterality: N/A;  . VESICOVAGINAL FISTULA CLOSURE W/ TAH    . VIDEO BRONCHOSCOPY Bilateral 04/02/2015   Procedure: VIDEO BRONCHOSCOPY WITHOUT FLUORO;  Surgeon: Juanito Doom, MD;  Location: [facility];  Service: Cardiopulmonary;  Laterality: Bilateral;     Social History   Tobacco Use  . Smoking status: Current Every Day Smoker    Packs/day: 0.25    Years: 30.00    Pack years: 7.50    Types: Cigarettes  . Smokeless tobacco: Never Used  . Tobacco comment: currently smoking .25 ppd, trying to quit 02/13/16  Substance Use Topics  . Alcohol use: No    Alcohol/week: 0.0 standard drinks  . Drug use: No      Family History  Problem Relation Age of Onset  . Hypertension Mother   . Colon cancer Mother   . Cancer Mother        colon  . Coronary artery disease Mother   . Diabetes Mother   . Prostate cancer Father   . COPD Father   . Cancer  Father        prostate  . Heart attack Brother 45  . Sleep apnea Brother   . Diabetes Brother   . Diabetes Paternal Aunt   . Diabetes Paternal Uncle   . Cancer Daughter        Brain, lungs, liver, spine,  kidney     Allergies  Allergen Reactions  . Morphine Anaphylaxis  . Prednisone Other (See Comments)    "makes me think I am having a heart attack"  . Gabapentin Other (See Comments)    intolerance  . Hydralazine Itching  . Nsaids Diarrhea  . Penicillins Swelling    TOLERATED CEFAZOLIN 07/2020 Has patient had a PCN reaction causing immediate rash, facial/tongue/throat swelling, SOB or lightheadedness with hypotension: Yes Has patient had a PCN reaction causing severe rash involving mucus membranes or skin necrosis: No Has patient had a PCN reaction that required hospitalization: No Has patient had a PCN reaction occurring within the last 10 years: No If all of the above answers are "NO", then may proceed with Cephalosporin use.     REVIEW OF SYSTEMS(Negative unless checked)  Constitutional: '[]'$ ??Weight loss'[]'$ ??Fever'[]'$ ??Chills Cardiac:'[]'$ ??Chest pain'[]'$ ??Chest pressure'[]'$ ??Palpitations '[]'$ ??Shortness of breath when laying flat '[]'$ ??Shortness of breath at rest '[x]'$ ??Shortness of breath with exertion. Vascular: '[]'$ ??Pain in legs with walking'[]'$ ??Pain in legsat rest'[]'$ ??Pain in legs when laying flat '[]'$ ??Claudication '[]'$ ??Pain in feet when walking '[]'$ ??Pain in feet at rest '[]'$ ??Pain in feet when laying flat '[]'$ ??History of DVT '[]'$ ??Phlebitis '[x]'$ ??Swelling in legs '[]'$ ??Varicose veins '[]'$ ??Non-healing ulcers Pulmonary: '[]'$ ??Uses home oxygen '[]'$ ??Productive cough'[]'$ ??Hemoptysis '[]'$ ??Wheeze '[]'$ ??COPD '[]'$ ??Asthma Neurologic: '[]'$ ??Dizziness '[]'$ ??Blackouts '[]'$ ??Seizures '[]'$ ??History of stroke '[]'$ ??History of TIA'[]'$ ??Aphasia '[]'$ ??Temporary blindness'[]'$ ??Dysphagia '[]'$ ??Weaknessor numbness in arms '[]'$ ??Weakness or numbnessin legs Musculoskeletal: '[x]'$ ??Arthritis '[]'$ ??Joint swelling '[]'$ ??Joint pain '[x]'$ ??Low back pain Hematologic:'[x]'$ ??Easy bruising'[]'$ ??Easy bleeding '[]'$ ??Hypercoagulable state '[]'$ ??Anemic '[]'$ ??Hepatitis Gastrointestinal:'[]'$ ??Blood in  stool'[]'$ ??Vomiting blood'[x]'$ ??Gastroesophageal reflux/heartburn'[]'$ ??Abdominal pain Genitourinary: '[x]'$ ??Chronic kidney disease '[]'$ ??Difficulturination '[]'$ ??Frequenturination '[]'$ ??Burning with urination'[]'$ ??Hematuria Skin: '[]'$ ??Rashes '[]'$ ??Ulcers '[]'$ ??Wounds Psychological: '[]'$ ??History of anxiety  Physical Examination  BP 135/74   Pulse 66   Ht '4\' 11"'$  (1.499 m)   Wt 175 lb (79.4 kg)   BMI 35.35 kg/m  Gen:  WD/WN, NAD Head: [place]/AT, No temporalis wasting. Ear/Nose/Throat: Hearing grossly intact, nares w/o erythema or drainage Eyes: Conjunctiva clear. Sclera non-icteric Neck: Supple.  Trachea midline Pulmonary:  Good air movement, no use of accessory muscles.  Cardiac: RRR, no JVD Vascular:  Vessel Right Left  Radial Palpable Palpable                          PT Not Palpable Not Palpable  DP 1+ Palpable 1+ Palpable   Gastrointestinal: soft, non-tender/non-distended. No guarding/reflex.  Musculoskeletal: M/S 5/5 throughout.  No deformity or atrophy.  In a wheelchair.  2+ right lower extremity edema.  1-2+ left lower extremity edema. Neurologic: Sensation grossly intact in extremities.  Symmetrical.  Speech is fluent.  Psychiatric: Judgment intact, Mood & affect appropriate for pt's clinical situation. Dermatologic: No rashes or ulcers noted.  No cellulitis or open wounds.       Labs Recent Results (from the past 2160 hour(s))  Resp Panel by RT-PCR (Flu A&B, Covid) Nasopharyngeal Swab     Status: None   Collection Time: 12/31/20  6:39 PM   Specimen: Nasopharyngeal Swab; Nasopharyngeal(NP) swabs in vial transport medium  Result Value Ref Range   SARS Coronavirus 2 by RT PCR NEGATIVE NEGATIVE    Comment: (NOTE) SARS-CoV-2 target nucleic acids are NOT DETECTED.  The SARS-CoV-2 RNA is generally detectable in upper respiratory specimens  during the acute phase of infection. The lowest concentration of SARS-CoV-2 viral copies this assay can detect is 138  copies/mL. A negative result does not preclude SARS-Cov-2 infection and should not be used as the sole basis for treatment or other patient management decisions. A negative result may occur with  improper specimen collection/handling, submission of specimen other than nasopharyngeal swab, presence of viral mutation(s) within the areas targeted by this assay, and inadequate number of viral copies(<138 copies/mL). A negative result must be combined with clinical observations, patient history, and epidemiological information. The expected result is Negative.  Fact Sheet for Patients:  EntrepreneurPulse.com.au  Fact Sheet for Healthcare Providers:  IncredibleEmployment.be  This test is no t yet approved or cleared by the [place] FDA and  has been authorized for detection and/or diagnosis of SARS-CoV-2 by FDA under an Emergency Use Authorization (EUA). This EUA will remain  in effect (meaning this test can be used) for the duration of the COVID-19 declaration under Section 564(b)(1) of the Act, 21 U.S.C.section 360bbb-3(b)(1), unless the authorization is terminated  or revoked sooner.       Influenza A by PCR NEGATIVE NEGATIVE   Influenza B by PCR NEGATIVE NEGATIVE    Comment: (NOTE) The Xpert Xpress SARS-CoV-2/FLU/RSV plus assay is intended as an aid in the diagnosis of influenza from Nasopharyngeal swab specimens and should not be used as a sole basis for treatment. Nasal washings and aspirates are unacceptable for Xpert Xpress SARS-CoV-2/FLU/RSV testing.  Fact Sheet for Patients: EntrepreneurPulse.com.au  Fact Sheet for Healthcare Providers: IncredibleEmployment.be  This test is not yet approved or cleared by the [place] FDA and has been authorized for detection and/or diagnosis of SARS-CoV-2 by FDA under an Emergency Use Authorization (EUA). This EUA will remain in effect (meaning this test  can be used) for the duration of the COVID-19 declaration under Section 564(b)(1) of the Act, 21 U.S.C. section 360bbb-3(b)(1), unless the authorization is terminated or revoked.  Performed at [facility], [address] [place], [place] [address]   Comprehensive metabolic panel     Status: Abnormal   Collection Time: 12/31/20  6:39 PM  Result Value Ref Range   Sodium 133 (L) 135 - 145 mmol/L   Potassium 3.0 (L) 3.5 - 5.1 mmol/L   Chloride 96 (L) 98 - 111 mmol/L   CO2 26 22 - 32 mmol/L   Glucose, Bld 226 (H) 70 - 99 mg/dL    Comment: Glucose reference range applies only to samples taken after fasting for at least 8 hours.   BUN 42 (H) 8 - 23 mg/dL   Creatinine, Ser 5.96 (H) 0.44 - 1.00 mg/dL   Calcium 8.6 (L) 8.9 - 10.3 mg/dL   Total Protein 6.5 6.5 - 8.1 g/dL   Albumin 2.5 (L) 3.5 - 5.0 g/dL   AST 24 15 - 41 U/L   ALT 16 0 - 44 U/L   Alkaline Phosphatase 41 38 - 126 U/L   Total Bilirubin 0.5 0.3 - 1.2 mg/dL   GFR, Estimated 7 (L) >60 mL/min    Comment: (NOTE) Calculated using the CKD-EPI Creatinine Equation (2021)    Anion gap 11 5 - 15    Comment: Performed at [facility], [address] [place], [place] [address]  Troponin I (High Sensitivity)     Status: Abnormal   Collection Time: 12/31/20  6:39 PM  Result Value Ref Range   Troponin I (High Sensitivity) 33 (H) <18 ng/L    Comment: (NOTE)  Elevated high sensitivity troponin I (hsTnI) values and significant  changes across serial measurements may suggest ACS but many other  chronic and acute conditions are known to elevate hsTnI results.  Refer to the "Links" section for chest pain algorithms and additional  guidance. Performed at [facility], [address] [place], [place] [address]   CBC with Differential     Status: Abnormal   Collection Time: 12/31/20  6:39 PM  Result Value Ref Range   WBC 9.2 4.0 - 10.5 K/uL   RBC 4.24 3.87 - 5.11 MIL/uL   Hemoglobin 13.1 12.0 - 15.0 g/dL    HCT 40.3 36.0 - 46.0 %   MCV 95.0 80.0 - 100.0 fL   MCH 30.9 26.0 - 34.0 pg   MCHC 32.5 30.0 - 36.0 g/dL   RDW 13.2 11.5 - 15.5 %   Platelets 244 150 - 400 K/uL   nRBC 0.0 0.0 - 0.2 %   Neutrophils Relative % 74 %   Neutro Abs 6.8 1.7 - 7.7 K/uL   Lymphocytes Relative 17 %   Lymphs Abs 1.6 0.7 - 4.0 K/uL   Monocytes Relative 8 %   Monocytes Absolute 0.7 0.1 - 1.0 K/uL   Eosinophils Relative 0 %   Eosinophils Absolute 0.0 0.0 - 0.5 K/uL   Basophils Relative 0 %   Basophils Absolute 0.0 0.0 - 0.1 K/uL   Immature Granulocytes 1 %   Abs Immature Granulocytes 0.09 (H) 0.00 - 0.07 K/uL    Comment: Performed at [facility], [address] [place], [place] [address]  Type and screen     Status: None   Collection Time: 12/31/20  9:19 PM  Result Value Ref Range   ABO/RH(D) A POS    Antibody Screen NEG    Sample Expiration      01/03/2021,2359 Performed at [facility], [place], [name] [address]   Troponin I (High Sensitivity)     Status: Abnormal   Collection Time: 12/31/20  9:19 PM  Result Value Ref Range   Troponin I (High Sensitivity) 34 (H) <18 ng/L    Comment: (NOTE) Elevated high sensitivity troponin I (hsTnI) values and significant  changes across serial measurements may suggest ACS but many other  chronic and acute conditions are known to elevate hsTnI results.  Refer to the "Links" section for chest pain algorithms and additional  guidance. Performed at [facility], [address] [place],  [address]   Glucose, capillary     Status: Abnormal   Collection Time: 01/01/21 12:22 AM  Result Value Ref Range   Glucose-Capillary 224 (H) 70 - 99 mg/dL    Comment: Glucose reference range applies only to samples taken after fasting for at least 8 hours.   Comment 1 Notify RN   Glucose, capillary     Status: Abnormal   Collection Time: 01/01/21  4:45 AM  Result Value Ref Range   Glucose-Capillary 232 (H) 70 - 99 mg/dL     Comment: Glucose reference range applies only to samples taken after fasting for at least 8 hours.   Comment 1 Notify RN   CBC     Status: Abnormal   Collection Time: 01/01/21  6:43 AM  Result Value Ref Range   WBC 8.3 4.0 - 10.5 K/uL   RBC 3.86 (L) 3.87 - 5.11 MIL/uL   Hemoglobin 11.9 (L) 12.0 - 15.0 g/dL   HCT 36.9 36.0 - 46.0 %   MCV 95.6 80.0 -  100.0 fL   MCH 30.8 26.0 - 34.0 pg   MCHC 32.2 30.0 - 36.0 g/dL   RDW 13.3 11.5 - 15.5 %   Platelets 218 150 - 400 K/uL   nRBC 0.0 0.0 - 0.2 %    Comment: Performed at [facility], [address] [place], [place] [id number]  Basic metabolic panel     Status: Abnormal   Collection Time: 01/01/21  6:43 AM  Result Value Ref Range   Sodium 135 135 - 145 mmol/L   Potassium 3.3 (L) 3.5 - 5.1 mmol/L   Chloride 98 98 - 111 mmol/L   CO2 27 22 - 32 mmol/L   Glucose, Bld 217 (H) 70 - 99 mg/dL    Comment: Glucose reference range applies only to samples taken after fasting for at least 8 hours.   BUN 49 (H) 8 - 23 mg/dL   Creatinine, Ser 6.14 (H) 0.44 - 1.00 mg/dL   Calcium 8.6 (L) 8.9 - 10.3 mg/dL   GFR, Estimated 7 (L) >60 mL/min    Comment: (NOTE) Calculated using the CKD-EPI Creatinine Equation (2021)    Anion gap 10 5 - 15    Comment: Performed at [facility], [address] [place], [place] [address]  Glucose, capillary     Status: Abnormal   Collection Time: 01/01/21  7:50 AM  Result Value Ref Range   Glucose-Capillary 161 (H) 70 - 99 mg/dL    Comment: Glucose reference range applies only to samples taken after fasting for at least 8 hours.  Glucose, capillary     Status: Abnormal   Collection Time: 01/01/21 11:32 AM  Result Value Ref Range   Glucose-Capillary 119 (H) 70 - 99 mg/dL    Comment: Glucose reference range applies only to samples taken after fasting for at least 8 hours.  Glucose, capillary     Status: Abnormal   Collection Time: 01/01/21  4:20 PM  Result Value Ref Range   Glucose-Capillary 111  (H) 70 - 99 mg/dL    Comment: Glucose reference range applies only to samples taken after fasting for at least 8 hours.  Glucose, capillary     Status: Abnormal   Collection Time: 01/01/21  5:33 PM  Result Value Ref Range   Glucose-Capillary 105 (H) 70 - 99 mg/dL    Comment: Glucose reference range applies only to samples taken after fasting for at least 8 hours.  Glucose, capillary     Status: Abnormal   Collection Time: 01/01/21  9:19 PM  Result Value Ref Range   Glucose-Capillary 239 (H) 70 - 99 mg/dL    Comment: Glucose reference range applies only to samples taken after fasting for at least 8 hours.  Glucose, capillary     Status: Abnormal   Collection Time: 01/02/21 12:26 AM  Result Value Ref Range   Glucose-Capillary 347 (H) 70 - 99 mg/dL    Comment: Glucose reference range applies only to samples taken after fasting for at least 8 hours.  Glucose, capillary     Status: Abnormal   Collection Time: 01/02/21  4:25 AM  Result Value Ref Range   Glucose-Capillary 368 (H) 70 - 99 mg/dL    Comment: Glucose reference range applies only to samples taken after fasting for at least 8 hours.  Glucose, capillary     Status: Abnormal   Collection Time: 01/02/21  8:07 AM  Result Value Ref Range   Glucose-Capillary 291 (H) 70 - 99 mg/dL    Comment:  Glucose reference range applies only to samples taken after fasting for at least 8 hours.   Comment 1 Notify RN   Glucose, capillary     Status: Abnormal   Collection Time: 01/02/21 12:08 PM  Result Value Ref Range   Glucose-Capillary 325 (H) 70 - 99 mg/dL    Comment: Glucose reference range applies only to samples taken after fasting for at least 8 hours.   Comment 1 Notify RN   Hemoglobin A1c     Status: Abnormal   Collection Time: 01/02/21  1:53 PM  Result Value Ref Range   Hgb A1c MFr Bld 10.0 (H) 4.8 - 5.6 %    Comment: (NOTE) Pre diabetes:          5.7%-6.4%  Diabetes:              >6.4%  Glycemic control for   <7.0% adults with  diabetes    Mean Plasma Glucose 240.3 mg/dL    Comment: Performed at [address] [place], [name] [address]  Glucose, capillary     Status: Abnormal   Collection Time: 01/02/21  4:55 PM  Result Value Ref Range   Glucose-Capillary 274 (H) 70 - 99 mg/dL    Comment: Glucose reference range applies only to samples taken after fasting for at least 8 hours.  Glucose, capillary     Status: Abnormal   Collection Time: 01/02/21  8:23 PM  Result Value Ref Range   Glucose-Capillary 195 (H) 70 - 99 mg/dL    Comment: Glucose reference range applies only to samples taken after fasting for at least 8 hours.   Comment 1 Notify RN   Glucose, capillary     Status: Abnormal   Collection Time: 01/02/21 10:54 PM  Result Value Ref Range   Glucose-Capillary 273 (H) 70 - 99 mg/dL    Comment: Glucose reference range applies only to samples taken after fasting for at least 8 hours.  Glucose, capillary     Status: Abnormal   Collection Time: 01/03/21 12:11 AM  Result Value Ref Range   Glucose-Capillary 289 (H) 70 - 99 mg/dL    Comment: Glucose reference range applies only to samples taken after fasting for at least 8 hours.   Comment 1 Notify RN   Glucose, capillary     Status: Abnormal   Collection Time: 01/03/21  5:05 AM  Result Value Ref Range   Glucose-Capillary 205 (H) 70 - 99 mg/dL    Comment: Glucose reference range applies only to samples taken after fasting for at least 8 hours.   Comment 1 Notify RN   Glucose, capillary     Status: Abnormal   Collection Time: 01/03/21  7:45 AM  Result Value Ref Range   Glucose-Capillary 179 (H) 70 - 99 mg/dL    Comment: Glucose reference range applies only to samples taken after fasting for at least 8 hours.  Glucose, capillary     Status: Abnormal   Collection Time: 01/03/21 11:56 AM  Result Value Ref Range   Glucose-Capillary 232 (H) 70 - 99 mg/dL    Comment: Glucose reference range applies only to samples taken after fasting for at  least 8 hours.  Magnesium     Status: None   Collection Time: 01/03/21 12:50 PM  Result Value Ref Range   Magnesium 1.7 1.7 - 2.4 mg/dL    Comment: Performed at [facility], [address] [place], [place] [address]  Glucose, capillary  Status: None   Collection Time: 01/03/21  4:03 PM  Result Value Ref Range   Glucose-Capillary 81 70 - 99 mg/dL    Comment: Glucose reference range applies only to samples taken after fasting for at least 8 hours.  Glucose, capillary     Status: Abnormal   Collection Time: 01/03/21  8:14 PM  Result Value Ref Range   Glucose-Capillary 155 (H) 70 - 99 mg/dL    Comment: Glucose reference range applies only to samples taken after fasting for at least 8 hours.   Comment 1 Notify RN   Glucose, capillary     Status: Abnormal   Collection Time: 01/03/21 11:51 PM  Result Value Ref Range   Glucose-Capillary 240 (H) 70 - 99 mg/dL    Comment: Glucose reference range applies only to samples taken after fasting for at least 8 hours.   Comment 1 Notify RN   Glucose, capillary     Status: Abnormal   Collection Time: 01/04/21  3:46 AM  Result Value Ref Range   Glucose-Capillary 258 (H) 70 - 99 mg/dL    Comment: Glucose reference range applies only to samples taken after fasting for at least 8 hours.   Comment 1 Notify RN   Glucose, capillary     Status: Abnormal   Collection Time: 01/04/21  7:30 AM  Result Value Ref Range   Glucose-Capillary 174 (H) 70 - 99 mg/dL    Comment: Glucose reference range applies only to samples taken after fasting for at least 8 hours.   Comment 1 Notify RN   Glucose, capillary     Status: Abnormal   Collection Time: 01/04/21 11:44 AM  Result Value Ref Range   Glucose-Capillary 140 (H) 70 - 99 mg/dL    Comment: Glucose reference range applies only to samples taken after fasting for at least 8 hours.  Hepatitis B core antibody, total     Status: None   Collection Time: 01/04/21  2:52 PM  Result Value Ref Range   Hep B  Core Total Ab NON REACTIVE NON REACTIVE    Comment: Performed at [facility] [address], [place], [place] [address]  Hepatitis B surface antigen     Status: None   Collection Time: 01/04/21  2:52 PM  Result Value Ref Range   Hepatitis B Surface Ag NON REACTIVE NON REACTIVE    Comment: Performed at [facility] [address] [place], [place] [address]  Hepatitis B DNA, ultraquantitative, PCR     Status: None   Collection Time: 01/04/21  2:52 PM  Result Value Ref Range   HBV DNA SERPL PCR-ACNC HBV DNA not detected IU/mL   HBV DNA SERPL PCR-LOG IU UNABLE TO CALCULATE log10 IU/mL    Comment: (NOTE) Unable to calculate result since non-numeric result obtained for component test.    Test Info: Comment     Comment: (NOTE) The reportable range for this assay is 10 IU/mL to 1 billion IU/mL. Performed At: [facility] [place], [name] [id number] Rush Farmer MD [id number]   Hepatitis B surface antibody     Status: Abnormal   Collection Time: 01/04/21  2:52 PM  Result Value Ref Range   Hepatitis B-Post <3.1 (L) Immunity>9.9 mIU/mL    Comment: (NOTE)  Status of Immunity                     Anti-HBs Level  ------------------                     --------------  Inconsistent with Immunity                   0.0 - 9.9 Consistent with Immunity                          >9.9 Performed At: [facility] [place], [name] [id number] Rush Farmer MD [id number]   Glucose, capillary     Status: None   Collection Time: 01/04/21  4:55 PM  Result Value Ref Range   Glucose-Capillary 74 70 - 99 mg/dL    Comment: Glucose reference range applies only to samples taken after fasting for at least 8 hours.  Glucose, capillary     Status: Abnormal   Collection Time: 01/04/21  7:53 PM  Result Value Ref Range   Glucose-Capillary 261 (H) 70 - 99 mg/dL    Comment: Glucose reference range applies only to samples taken after fasting for at  least 8 hours.   Comment 1 Notify RN   Glucose, capillary     Status: Abnormal   Collection Time: 01/04/21 10:34 PM  Result Value Ref Range   Glucose-Capillary 404 (H) 70 - 99 mg/dL    Comment: Glucose reference range applies only to samples taken after fasting for at least 8 hours.   Comment 1 Notify RN   Glucose, capillary     Status: Abnormal   Collection Time: 01/05/21 12:43 AM  Result Value Ref Range   Glucose-Capillary 356 (H) 70 - 99 mg/dL    Comment: Glucose reference range applies only to samples taken after fasting for at least 8 hours.   Comment 1 Notify RN   Glucose, capillary     Status: Abnormal   Collection Time: 01/05/21  3:54 AM  Result Value Ref Range   Glucose-Capillary 340 (H) 70 - 99 mg/dL    Comment: Glucose reference range applies only to samples taken after fasting for at least 8 hours.   Comment 1 Notify RN   Glucose, capillary     Status: Abnormal   Collection Time: 01/05/21  7:44 AM  Result Value Ref Range   Glucose-Capillary 288 (H) 70 - 99 mg/dL    Comment: Glucose reference range applies only to samples taken after fasting for at least 8 hours.  Glucose, capillary     Status: Abnormal   Collection Time: 01/05/21 10:42 AM  Result Value Ref Range   Glucose-Capillary 301 (H) 70 - 99 mg/dL    Comment: Glucose reference range applies only to samples taken after fasting for at least 8 hours.  Glucose, capillary     Status: Abnormal   Collection Time: 01/05/21 11:25 AM  Result Value Ref Range   Glucose-Capillary 277 (H) 70 - 99 mg/dL    Comment: Glucose reference range applies only to samples taken after fasting for at least 8 hours.  Glucose, capillary     Status: Abnormal   Collection Time: 01/05/21  4:17 PM  Result Value Ref Range   Glucose-Capillary 207 (H) 70 - 99 mg/dL    Comment: Glucose reference range applies only to samples taken after fasting for at least 8 hours.  Renal function panel     Status: Abnormal   Collection Time: 01/05/21  6:00 PM   Result Value Ref Range   Sodium 126 (L) 135 - 145 mmol/L   Potassium 3.0 (L) 3.5 - 5.1 mmol/L   Chloride 92 (L) 98 - 111 mmol/L   CO2 27 22 -  32 mmol/L   Glucose, Bld 155 (H) 70 - 99 mg/dL    Comment: Glucose reference range applies only to samples taken after fasting for at least 8 hours.   BUN 60 (H) 8 - 23 mg/dL   Creatinine, Ser 5.16 (H) 0.44 - 1.00 mg/dL   Calcium 8.1 (L) 8.9 - 10.3 mg/dL   Phosphorus 5.2 (H) 2.5 - 4.6 mg/dL   Albumin 1.9 (L) 3.5 - 5.0 g/dL   GFR, Estimated 8 (L) >60 mL/min    Comment: (NOTE) Calculated using the CKD-EPI Creatinine Equation (2021)    Anion gap 7 5 - 15    Comment: Performed at [facility], [address] [place], [place] [address]  Phosphorus     Status: Abnormal   Collection Time: 01/05/21  6:00 PM  Result Value Ref Range   Phosphorus 4.8 (H) 2.5 - 4.6 mg/dL    Comment: Performed at [facility], [address] [place], [place] [address]  Glucose, capillary     Status: None   Collection Time: 01/05/21  8:43 PM  Result Value Ref Range   Glucose-Capillary 87 70 - 99 mg/dL    Comment: Glucose reference range applies only to samples taken after fasting for at least 8 hours.  Glucose, capillary     Status: Abnormal   Collection Time: 01/06/21 12:07 AM  Result Value Ref Range   Glucose-Capillary 203 (H) 70 - 99 mg/dL    Comment: Glucose reference range applies only to samples taken after fasting for at least 8 hours.  Glucose, capillary     Status: Abnormal   Collection Time: 01/06/21  4:38 AM  Result Value Ref Range   Glucose-Capillary 244 (H) 70 - 99 mg/dL    Comment: Glucose reference range applies only to samples taken after fasting for at least 8 hours.  CBC     Status: Abnormal   Collection Time: 01/06/21  6:28 AM  Result Value Ref Range   WBC 7.2 4.0 - 10.5 K/uL   RBC 3.34 (L) 3.87 - 5.11 MIL/uL   Hemoglobin 10.5 (L) 12.0 - 15.0 g/dL   HCT 31.6 (L) 36.0 - 46.0 %   MCV 94.6 80.0 - 100.0 fL   MCH 31.4 26.0 -  34.0 pg   MCHC 33.2 30.0 - 36.0 g/dL   RDW 13.2 11.5 - 15.5 %   Platelets 184 150 - 400 K/uL   nRBC 0.0 0.0 - 0.2 %    Comment: Performed at [facility], [address] [place], [place] [address]  Glucose, capillary     Status: Abnormal   Collection Time: 01/06/21  8:22 AM  Result Value Ref Range   Glucose-Capillary 224 (H) 70 - 99 mg/dL    Comment: Glucose reference range applies only to samples taken after fasting for at least 8 hours.  Renal function panel     Status: Abnormal   Collection Time: 01/06/21 11:00 AM  Result Value Ref Range   Sodium 127 (L) 135 - 145 mmol/L   Potassium 3.1 (L) 3.5 - 5.1 mmol/L   Chloride 91 (L) 98 - 111 mmol/L   CO2 27 22 - 32 mmol/L   Glucose, Bld 185 (H) 70 - 99 mg/dL    Comment: Glucose reference range applies only to samples taken after fasting for at least 8 hours.   BUN 59 (H) 8 - 23 mg/dL   Creatinine, Ser 4.90 (H) 0.44 - 1.00 mg/dL   Calcium 8.1 (L) 8.9 - 10.3  mg/dL   Phosphorus 5.4 (H) 2.5 - 4.6 mg/dL   Albumin 1.9 (L) 3.5 - 5.0 g/dL   GFR, Estimated 9 (L) >60 mL/min    Comment: (NOTE) Calculated using the CKD-EPI Creatinine Equation (2021)    Anion gap 9 5 - 15    Comment: Performed at [facility], [address] [place], [place] [address]  Glucose, capillary     Status: None   Collection Time: 01/06/21  2:37 PM  Result Value Ref Range   Glucose-Capillary 74 70 - 99 mg/dL    Comment: Glucose reference range applies only to samples taken after fasting for at least 8 hours.  Glucose, capillary     Status: Abnormal   Collection Time: 01/06/21  4:51 PM  Result Value Ref Range   Glucose-Capillary 128 (H) 70 - 99 mg/dL    Comment: Glucose reference range applies only to samples taken after fasting for at least 8 hours.  SARS CORONAVIRUS 2 (TAT 6-24 HRS) Nasopharyngeal Nasopharyngeal Swab     Status: None   Collection Time: 01/06/21  6:20 PM   Specimen: Nasopharyngeal Swab  Result Value Ref Range   SARS  Coronavirus 2 NEGATIVE NEGATIVE    Comment: (NOTE) SARS-CoV-2 target nucleic acids are NOT DETECTED.  The SARS-CoV-2 RNA is generally detectable in upper and lower respiratory specimens during the acute phase of infection. Negative results do not preclude SARS-CoV-2 infection, do not rule out co-infections with other pathogens, and should not be used as the sole basis for treatment or other patient management decisions. Negative results must be combined with clinical observations, patient history, and epidemiological information. The expected result is Negative.  Fact Sheet for Patients: SugarRoll.be  Fact Sheet for Healthcare Providers: https://www.woods-mathews.com/  This test is not yet approved or cleared by the [place] FDA and  has been authorized for detection and/or diagnosis of SARS-CoV-2 by FDA under an Emergency Use Authorization (EUA). This EUA will remain  in effect (meaning this test can be used) for the duration of the COVID-19 declaration under Se ction 564(b)(1) of the Act, 21 U.S.C. section 360bbb-3(b)(1), unless the authorization is terminated or revoked sooner.  Performed at [facility], [place] [address] [place], [place] [address]   Renal function panel     Status: Abnormal   Collection Time: 01/06/21  7:23 PM  Result Value Ref Range   Sodium 129 (L) 135 - 145 mmol/L   Potassium 3.1 (L) 3.5 - 5.1 mmol/L   Chloride 97 (L) 98 - 111 mmol/L   CO2 25 22 - 32 mmol/L   Glucose, Bld 70 70 - 99 mg/dL    Comment: Glucose reference range applies only to samples taken after fasting for at least 8 hours.   BUN 41 (H) 8 - 23 mg/dL   Creatinine, Ser 3.70 (H) 0.44 - 1.00 mg/dL   Calcium 8.0 (L) 8.9 - 10.3 mg/dL   Phosphorus 4.1 2.5 - 4.6 mg/dL   Albumin 2.0 (L) 3.5 - 5.0 g/dL   GFR, Estimated 12 (L) >60 mL/min    Comment: (NOTE) Calculated using the CKD-EPI Creatinine Equation (2021)    Anion gap 7 5 - 15     Comment: Performed at [facility], [address] [place], [place] [address]  CBC     Status: Abnormal   Collection Time: 01/06/21  7:23 PM  Result Value Ref Range   WBC 8.6 4.0 - 10.5 K/uL   RBC 3.04 (L) 3.87 - 5.11 MIL/uL  Hemoglobin 9.4 (L) 12.0 - 15.0 g/dL   HCT 28.6 (L) 36.0 - 46.0 %   MCV 94.1 80.0 - 100.0 fL   MCH 30.9 26.0 - 34.0 pg   MCHC 32.9 30.0 - 36.0 g/dL   RDW 13.0 11.5 - 15.5 %   Platelets 189 150 - 400 K/uL   nRBC 0.0 0.0 - 0.2 %    Comment: Performed at [facility], [address] [place], [place] [address]  Glucose, capillary     Status: Abnormal   Collection Time: 01/06/21  7:55 PM  Result Value Ref Range   Glucose-Capillary 65 (L) 70 - 99 mg/dL    Comment: Glucose reference range applies only to samples taken after fasting for at least 8 hours.   Comment 1 Notify RN   Glucose, capillary     Status: None   Collection Time: 01/06/21  8:26 PM  Result Value Ref Range   Glucose-Capillary 92 70 - 99 mg/dL    Comment: Glucose reference range applies only to samples taken after fasting for at least 8 hours.   Comment 1 Notify RN   Glucose, capillary     Status: Abnormal   Collection Time: 01/06/21 11:45 PM  Result Value Ref Range   Glucose-Capillary 68 (L) 70 - 99 mg/dL    Comment: Glucose reference range applies only to samples taken after fasting for at least 8 hours.   Comment 1 Notify RN   Glucose, capillary     Status: Abnormal   Collection Time: 01/07/21 12:27 AM  Result Value Ref Range   Glucose-Capillary 114 (H) 70 - 99 mg/dL    Comment: Glucose reference range applies only to samples taken after fasting for at least 8 hours.   Comment 1 Notify RN   Glucose, capillary     Status: Abnormal   Collection Time: 01/07/21  3:58 AM  Result Value Ref Range   Glucose-Capillary 59 (L) 70 - 99 mg/dL    Comment: Glucose reference range applies only to samples taken after fasting for at least 8 hours.   Comment 1 Notify RN   CBC      Status: Abnormal   Collection Time: 01/07/21  4:45 AM  Result Value Ref Range   WBC 8.3 4.0 - 10.5 K/uL   RBC 3.08 (L) 3.87 - 5.11 MIL/uL   Hemoglobin 9.7 (L) 12.0 - 15.0 g/dL   HCT 28.9 (L) 36.0 - 46.0 %   MCV 93.8 80.0 - 100.0 fL   MCH 31.5 26.0 - 34.0 pg   MCHC 33.6 30.0 - 36.0 g/dL   RDW 12.9 11.5 - 15.5 %   Platelets 131 (L) 150 - 400 K/uL   nRBC 0.0 0.0 - 0.2 %    Comment: Performed at [facility], [address], [place], [place] [id number]  Basic metabolic panel     Status: Abnormal   Collection Time: 01/07/21  4:45 AM  Result Value Ref Range   Sodium 128 (L) 135 - 145 mmol/L   Potassium 3.3 (L) 3.5 - 5.1 mmol/L   Chloride 95 (L) 98 - 111 mmol/L   CO2 24 22 - 32 mmol/L   Glucose, Bld 102 (H) 70 - 99 mg/dL    Comment: Glucose reference range applies only to samples taken after fasting for at least 8 hours.   BUN 41 (H) 8 - 23 mg/dL   Creatinine, Ser 3.66 (H) 0.44 - 1.00 mg/dL   Calcium 8.0 (L) 8.9 - 10.3 mg/dL  GFR, Estimated 12 (L) >60 mL/min    Comment: (NOTE) Calculated using the CKD-EPI Creatinine Equation (2021)    Anion gap 9 5 - 15    Comment: Performed at [facility], [address] [place], [place] [address]  Glucose, capillary     Status: Abnormal   Collection Time: 01/07/21  4:58 AM  Result Value Ref Range   Glucose-Capillary 109 (H) 70 - 99 mg/dL    Comment: Glucose reference range applies only to samples taken after fasting for at least 8 hours.  Glucose, capillary     Status: Abnormal   Collection Time: 01/07/21  8:13 AM  Result Value Ref Range   Glucose-Capillary 105 (H) 70 - 99 mg/dL    Comment: Glucose reference range applies only to samples taken after fasting for at least 8 hours.  Glucose, capillary     Status: None   Collection Time: 01/07/21  1:55 PM  Result Value Ref Range   Glucose-Capillary 93 70 - 99 mg/dL    Comment: Glucose reference range applies only to samples taken after fasting for at least 8 hours.   Glucose, capillary     Status: Abnormal   Collection Time: 01/07/21  4:19 PM  Result Value Ref Range   Glucose-Capillary 182 (H) 70 - 99 mg/dL    Comment: Glucose reference range applies only to samples taken after fasting for at least 8 hours.  Glucose, capillary     Status: Abnormal   Collection Time: 01/07/21 10:00 PM  Result Value Ref Range   Glucose-Capillary 310 (H) 70 - 99 mg/dL    Comment: Glucose reference range applies only to samples taken after fasting for at least 8 hours.   Comment 1 Notify RN   Glucose, capillary     Status: Abnormal   Collection Time: 01/08/21 12:16 AM  Result Value Ref Range   Glucose-Capillary 207 (H) 70 - 99 mg/dL    Comment: Glucose reference range applies only to samples taken after fasting for at least 8 hours.   Comment 1 Notify RN    Comment 2 Document in Chart   Basic metabolic panel     Status: Abnormal   Collection Time: 01/08/21  5:09 AM  Result Value Ref Range   Sodium 131 (L) 135 - 145 mmol/L   Potassium 3.6 3.5 - 5.1 mmol/L   Chloride 99 98 - 111 mmol/L   CO2 25 22 - 32 mmol/L   Glucose, Bld 128 (H) 70 - 99 mg/dL    Comment: Glucose reference range applies only to samples taken after fasting for at least 8 hours.   BUN 37 (H) 8 - 23 mg/dL   Creatinine, Ser 3.40 (H) 0.44 - 1.00 mg/dL   Calcium 8.0 (L) 8.9 - 10.3 mg/dL   GFR, Estimated 14 (L) >60 mL/min    Comment: (NOTE) Calculated using the CKD-EPI Creatinine Equation (2021)    Anion gap 7 5 - 15    Comment: Performed at [facility], [address] [place], [place] [address]  Glucose, capillary     Status: None   Collection Time: 01/08/21  7:48 AM  Result Value Ref Range   Glucose-Capillary 97 70 - 99 mg/dL    Comment: Glucose reference range applies only to samples taken after fasting for at least 8 hours.  Glucose, capillary     Status: Abnormal   Collection Time: 01/08/21 12:01 PM  Result Value Ref Range   Glucose-Capillary 158 (H) 70 - 99 mg/dL  Comment:  Glucose reference range applies only to samples taken after fasting for at least 8 hours.  Glucose, capillary     Status: Abnormal   Collection Time: 01/08/21  4:17 PM  Result Value Ref Range   Glucose-Capillary 143 (H) 70 - 99 mg/dL    Comment: Glucose reference range applies only to samples taken after fasting for at least 8 hours.  Glucose, capillary     Status: None   Collection Time: 01/08/21 10:26 PM  Result Value Ref Range   Glucose-Capillary 75 70 - 99 mg/dL    Comment: Glucose reference range applies only to samples taken after fasting for at least 8 hours.   Comment 1 Notify RN    Comment 2 Document in Chart   Glucose, capillary     Status: Abnormal   Collection Time: 01/08/21 11:27 PM  Result Value Ref Range   Glucose-Capillary 130 (H) 70 - 99 mg/dL    Comment: Glucose reference range applies only to samples taken after fasting for at least 8 hours.  Glucose, capillary     Status: Abnormal   Collection Time: 01/09/21  1:17 AM  Result Value Ref Range   Glucose-Capillary 263 (H) 70 - 99 mg/dL    Comment: Glucose reference range applies only to samples taken after fasting for at least 8 hours.   Comment 1 Notify RN    Comment 2 Document in Chart   SARS CORONAVIRUS 2 (TAT 6-24 HRS) Nasopharyngeal Nasopharyngeal Swab     Status: None   Collection Time: 01/09/21  4:00 AM   Specimen: Nasopharyngeal Swab  Result Value Ref Range   SARS Coronavirus 2 NEGATIVE NEGATIVE    Comment: (NOTE) SARS-CoV-2 target nucleic acids are NOT DETECTED.  The SARS-CoV-2 RNA is generally detectable in upper and lower respiratory specimens during the acute phase of infection. Negative results do not preclude SARS-CoV-2 infection, do not rule out co-infections with other pathogens, and should not be used as the sole basis for treatment or other patient management decisions. Negative results must be combined with clinical observations, patient history, and epidemiological information. The  expected result is Negative.  Fact Sheet for Patients: SugarRoll.be  Fact Sheet for Healthcare Providers: https://www.woods-mathews.com/  This test is not yet approved or cleared by the [place] FDA and  has been authorized for detection and/or diagnosis of SARS-CoV-2 by FDA under an Emergency Use Authorization (EUA). This EUA will remain  in effect (meaning this test can be used) for the duration of the COVID-19 declaration under Se ction 564(b)(1) of the Act, 21 U.S.C. section 360bbb-3(b)(1), unless the authorization is terminated or revoked sooner.  Performed at [facility], [place] [address], [place], [name] [address]   Glucose, capillary     Status: Abnormal   Collection Time: 01/09/21  7:43 AM  Result Value Ref Range   Glucose-Capillary 218 (H) 70 - 99 mg/dL    Comment: Glucose reference range applies only to samples taken after fasting for at least 8 hours.  Glucose, capillary     Status: Abnormal   Collection Time: 01/09/21 11:21 AM  Result Value Ref Range   Glucose-Capillary 201 (H) 70 - 99 mg/dL    Comment: Glucose reference range applies only to samples taken after fasting for at least 8 hours.  Resp Panel by RT-PCR (Flu A&B, Covid) Nasopharyngeal Swab     Status: None   Collection Time: 01/09/21 12:11 PM   Specimen: Nasopharyngeal Swab; Nasopharyngeal(NP) swabs in vial transport medium  Result Value Ref Range   SARS Coronavirus 2 by RT PCR NEGATIVE NEGATIVE    Comment: (NOTE) SARS-CoV-2 target nucleic acids are NOT DETECTED.  The SARS-CoV-2 RNA is generally detectable in upper respiratory specimens during the acute phase of infection. The lowest concentration of SARS-CoV-2 viral copies this assay can detect is 138 copies/mL. A negative result does not preclude SARS-Cov-2 infection and should not be used as the sole basis for treatment or other patient management decisions. A negative result may occur with   improper specimen collection/handling, submission of specimen other than nasopharyngeal swab, presence of viral mutation(s) within the areas targeted by this assay, and inadequate number of viral copies(<138 copies/mL). A negative result must be combined with clinical observations, patient history, and epidemiological information. The expected result is Negative.  Fact Sheet for Patients:  EntrepreneurPulse.com.au  Fact Sheet for Healthcare Providers:  IncredibleEmployment.be  This test is no t yet approved or cleared by the [place] FDA and  has been authorized for detection and/or diagnosis of SARS-CoV-2 by FDA under an Emergency Use Authorization (EUA). This EUA will remain  in effect (meaning this test can be used) for the duration of the COVID-19 declaration under Section 564(b)(1) of the Act, 21 U.S.C.section 360bbb-3(b)(1), unless the authorization is terminated  or revoked sooner.       Influenza A by PCR NEGATIVE NEGATIVE   Influenza B by PCR NEGATIVE NEGATIVE    Comment: (NOTE) The Xpert Xpress SARS-CoV-2/FLU/RSV plus assay is intended as an aid in the diagnosis of influenza from Nasopharyngeal swab specimens and should not be used as a sole basis for treatment. Nasal washings and aspirates are unacceptable for Xpert Xpress SARS-CoV-2/FLU/RSV testing.  Fact Sheet for Patients: EntrepreneurPulse.com.au  Fact Sheet for Healthcare Providers: IncredibleEmployment.be  This test is not yet approved or cleared by the [place] FDA and has been authorized for detection and/or diagnosis of SARS-CoV-2 by FDA under an Emergency Use Authorization (EUA). This EUA will remain in effect (meaning this test can be used) for the duration of the COVID-19 declaration under Section 564(b)(1) of the Act, 21 U.S.C. section 360bbb-3(b)(1), unless the authorization is terminated or revoked.  Performed at  [facility], [place], [place] [address]   SARS CORONAVIRUS 2 (TAT 6-24 HRS) Nasopharyngeal Nasopharyngeal Swab     Status: None   Collection Time: 02/03/21  9:17 AM   Specimen: Nasopharyngeal Swab  Result Value Ref Range   SARS Coronavirus 2 NEGATIVE NEGATIVE    Comment: (NOTE) SARS-CoV-2 target nucleic acids are NOT DETECTED.  The SARS-CoV-2 RNA is generally detectable in upper and lower respiratory specimens during the acute phase of infection. Negative results do not preclude SARS-CoV-2 infection, do not rule out co-infections with other pathogens, and should not be used as the sole basis for treatment or other patient management decisions. Negative results must be combined with clinical observations, patient history, and epidemiological information. The expected result is Negative.  Fact Sheet for Patients: SugarRoll.be  Fact Sheet for Healthcare Providers: https://www.woods-mathews.com/  This test is not yet approved or cleared by the [place] FDA and  has been authorized for detection and/or diagnosis of SARS-CoV-2 by FDA under an Emergency Use Authorization (EUA). This EUA will remain  in effect (meaning this test can be used) for the duration of the COVID-19 declaration under Se ction 564(b)(1) of the Act, 21 U.S.C. section 360bbb-3(b)(1), unless the authorization is terminated or revoked sooner.  Performed at [facility], [address] [place], [place] [address]     Radiology PERIPHERAL VASCULAR CATHETERIZATION  Result Date: 02/05/2021 See op note   Assessment/Plan Essential hypertension blood pressure control important in reducing the progression of atherosclerotic disease. On appropriate oral medications.   Diabetes mellitus type 2 with complications, uncontrolled blood glucose control important in reducing the progression of atherosclerotic disease. Also, involved in wound  healing. On appropriate medications.   CKD (chronic kidney disease), stage III Would plan limited contrast used with any intervention.  Had acute renal failure earlier this year after other medical issues and required dialysis for several weeks.  PermCath was removed a month ago.  PAD (peripheral artery disease) (HCC) Her ABIs today are 1.04 on the right and 0.80 on the left which are reasonably stable from her previous studies.  This is stable and can be checked in 1 year.  Abdominal pain The patient has significant postprandial abdominal pain and with her atherosclerotic history, I think it would be prudent to check a mesenteric duplex.  I have offered this to her at her convenience but she says she will call our office to schedule this.  She is tired and just wants to go home today.    Leotis Pain, MD  02/24/2021 4:49 PM    This note was created with Dragon medical transcription system.  Any errors from dictation are purely unintentional

## 2021-02-24 NOTE — Patient Instructions (Signed)
Peripheral Vascular Disease  Peripheral vascular disease (PVD) is a disease of the blood vessels that carry blood from the heart to the rest of the body. PVD is also called peripheral artery disease (PAD) or poor circulation. PVD affects most of the body. But it affects the legs and feet the most. PVD can lead to acute limb ischemia. This happens when there is a sudden stop of blood flow to an arm or leg. This is a medical emergency. What are the causes? The most common cause of PVD is a buildup of a fatty substance (plaque) inside your arteries. This decreases blood flow. Plaque can break off and block blood in a smaller artery. This can lead to acute limb ischemia. Other common causes of PVD include:  Blood clots inside the blood vessels.  Injuries to blood vessels.  Irritation and swelling of blood vessels.  Sudden tightening of the blood vessel (spasms). What increases the risk?  A family history of PVD.  Medical conditions, including: ? High cholesterol. ? Diabetes. ? High blood pressure. ? Heart disease. ? Past problems with blood clots. ? Past injury, such as burns or a broken bone.  Other conditions, such as: ? Buerger's disease. This is caused by swollen or irritated blood vessels in your hands and feet. ? Arthritis. ? Birth defects that affect the arteries in your legs. ? Kidney disease.  Using tobacco or nicotine products.  Not getting enough exercise.  Being very overweight (obese).  Being 50 years old or older. What are the signs or symptoms?  Cramps in your butt, legs, and feet.  Pain and weakness in your legs when you are active that goes away when you rest.  Leg pain when at rest.  Leg numbness, tingling, or weakness.  Coldness in a leg or foot, especially when compared with the other leg or foot.  Skin or hair changes. These can include: ? Hair loss. ? Shiny skin. ? Pale or bluish skin. ? Thick toenails.  Being unable to get or keep an  erection.  Tiredness (fatigue).  Weak pulse or no pulse in the feet.  Wounds and sores on the toes, feet, or legs. These take longer to heal. How is this treated? Underlying causes are treated first. Other conditions, like diabetes, high cholesterol, and blood pressure, are also treated. Treatment may include:  Lifestyle changes, such as: ? Quitting smoking. ? Getting regular exercise. ? Having a diet low in fat and cholesterol. ? Not drinking alcohol.  Taking medicines, such as: ? Blood thinners. ? Medicines to improve blood flow. ? Medicines to improve your blood cholesterol.  Procedures to: ? Open the arteries and restore blood flow. ? Insert a small mesh tube (stent) to keep a blocked vessel open. ? Create a new path for blood to flow to the body (peripheral bypass). ? Remove dead tissue from a wound. ? Remove an affected leg or arm. Follow these instructions at home: Medicines  Take over-the-counter and prescription medicines only as told by your doctor.  If you are taking blood thinners: ? Talk with your doctor before you take any medicines that have aspirin, or NSAIDs, such as ibuprofen. ? Take medicines exactly as told. Take them at the same time each day. ? Avoid doing things that could hurt or bruise you. Take action to prevent falls. ? Wear an alert bracelet or carry a card that shows you are taking blood thinners. Lifestyle  Get regular exercise. Ask your doctor about how to stay active.    Talk with your doctor about keeping a healthy weight. If needed, ask about losing weight.  Eat a diet that is low in fat and cholesterol. If you need help, talk with your doctor.  Do not drink alcohol.  Do not smoke or use any products that contain nicotine or tobacco. If you need help quitting, ask your doctor.      General instructions  Take good care of your feet. To do this: ? Wear shoes that fit well and feel good. ? Check your feet often for any cuts or  sores.  Get a flu shot (influenza vaccine) each year.  Keep all follow-up visits. Where to find more information  Society for Vascular Surgery: vascular.org  American Heart Association: heart.org  National Heart, Lung, and Blood Institute: nhlbi.nih.gov Contact a doctor if:  You have cramps in your legs when you walk.  You have leg pain when you rest.  Your leg or foot feels cold.  Your skin changes.  You cannot get or keep an erection.  You have cuts or sores on your legs or feet that do not heal. Get help right away if:  You have sudden changes in the color and feeling of your arms or legs, such as: ? Your arm or leg turns cold, numb, and blue. ? Your arm or leg becomes red, warm, swollen, painful, or numb.  You have any signs of a stroke. "BE FAST" is an easy way to remember the main warning signs: ? B - Balance. Dizziness, sudden trouble walking, or loss of balance. ? E - Eyes. Trouble seeing or a change in how you see. ? F - Face. Sudden weakness or loss of feeling of the face. The face or eyelid may droop on one side. ? A - Arms. Weakness or loss of feeling in an arm. This happens all of a sudden and most often on one side of the body. ? S - Speech. Sudden trouble speaking, slurred speech, or trouble understanding what people say. ? T - Time. Time to call emergency services. Write down what time symptoms started.  You have other signs of a stroke, such as: ? A sudden, very bad headache with no known cause. ? Feeling like you may vomit (nausea). ? Vomiting. ? A seizure.  You have chest pain or trouble breathing. These symptoms may be an emergency. Get help right away. Call your local emergency services (911 in the [place].S.).  Do not wait to see if the symptoms will go away.  Do not drive yourself to the hospital. Summary  Peripheral vascular disease (PVD) is a disease of the blood vessels.  PVD affects the legs and feet the most.  Symptoms may include leg  pain or leg numbness, tingling, and weakness.  Treatment may include lifestyle changes, medicines, and procedures. This information is not intended to replace advice given to you by your health care provider. Make sure you discuss any questions you have with your health care provider. Document Revised: 05/05/2020 Document Reviewed: 05/05/2020 Elsevier Patient Education  2021 Elsevier Inc.  

## 2021-02-25 DIAGNOSIS — Z79891 Long term (current) use of opiate analgesic: Secondary | ICD-10-CM | POA: Diagnosis not present

## 2021-02-25 DIAGNOSIS — S72002D Fracture of unspecified part of neck of left femur, subsequent encounter for closed fracture with routine healing: Secondary | ICD-10-CM | POA: Diagnosis not present

## 2021-02-25 DIAGNOSIS — K589 Irritable bowel syndrome without diarrhea: Secondary | ICD-10-CM | POA: Diagnosis not present

## 2021-02-25 DIAGNOSIS — E1151 Type 2 diabetes mellitus with diabetic peripheral angiopathy without gangrene: Secondary | ICD-10-CM | POA: Diagnosis not present

## 2021-02-25 DIAGNOSIS — Z96653 Presence of artificial knee joint, bilateral: Secondary | ICD-10-CM | POA: Diagnosis not present

## 2021-02-25 DIAGNOSIS — G4700 Insomnia, unspecified: Secondary | ICD-10-CM | POA: Diagnosis not present

## 2021-02-25 DIAGNOSIS — Z9181 History of falling: Secondary | ICD-10-CM | POA: Diagnosis not present

## 2021-02-25 DIAGNOSIS — E1122 Type 2 diabetes mellitus with diabetic chronic kidney disease: Secondary | ICD-10-CM | POA: Diagnosis not present

## 2021-02-25 DIAGNOSIS — I2510 Atherosclerotic heart disease of native coronary artery without angina pectoris: Secondary | ICD-10-CM | POA: Diagnosis not present

## 2021-02-25 DIAGNOSIS — F32A Depression, unspecified: Secondary | ICD-10-CM | POA: Diagnosis not present

## 2021-02-25 DIAGNOSIS — N183 Chronic kidney disease, stage 3 unspecified: Secondary | ICD-10-CM | POA: Diagnosis not present

## 2021-02-25 DIAGNOSIS — F17210 Nicotine dependence, cigarettes, uncomplicated: Secondary | ICD-10-CM | POA: Diagnosis not present

## 2021-02-25 DIAGNOSIS — I129 Hypertensive chronic kidney disease with stage 1 through stage 4 chronic kidney disease, or unspecified chronic kidney disease: Secondary | ICD-10-CM | POA: Diagnosis not present

## 2021-02-25 DIAGNOSIS — J449 Chronic obstructive pulmonary disease, unspecified: Secondary | ICD-10-CM | POA: Diagnosis not present

## 2021-03-02 DIAGNOSIS — R2241 Localized swelling, mass and lump, right lower limb: Secondary | ICD-10-CM | POA: Diagnosis not present

## 2021-03-02 DIAGNOSIS — S72002A Fracture of unspecified part of neck of left femur, initial encounter for closed fracture: Secondary | ICD-10-CM | POA: Diagnosis not present

## 2021-03-14 DIAGNOSIS — Z992 Dependence on renal dialysis: Secondary | ICD-10-CM | POA: Diagnosis not present

## 2021-03-14 DIAGNOSIS — N186 End stage renal disease: Secondary | ICD-10-CM | POA: Diagnosis not present

## 2021-03-15 DIAGNOSIS — N186 End stage renal disease: Secondary | ICD-10-CM | POA: Diagnosis not present

## 2021-03-15 DIAGNOSIS — E440 Moderate protein-calorie malnutrition: Secondary | ICD-10-CM | POA: Diagnosis not present

## 2021-03-15 DIAGNOSIS — N2581 Secondary hyperparathyroidism of renal origin: Secondary | ICD-10-CM | POA: Diagnosis not present

## 2021-03-15 DIAGNOSIS — D509 Iron deficiency anemia, unspecified: Secondary | ICD-10-CM | POA: Diagnosis not present

## 2021-03-15 DIAGNOSIS — N2589 Other disorders resulting from impaired renal tubular function: Secondary | ICD-10-CM | POA: Diagnosis not present

## 2021-03-15 DIAGNOSIS — K769 Liver disease, unspecified: Secondary | ICD-10-CM | POA: Diagnosis not present

## 2021-03-15 DIAGNOSIS — D631 Anemia in chronic kidney disease: Secondary | ICD-10-CM | POA: Diagnosis not present

## 2021-03-15 DIAGNOSIS — Z79899 Other long term (current) drug therapy: Secondary | ICD-10-CM | POA: Diagnosis not present

## 2021-03-15 DIAGNOSIS — Z992 Dependence on renal dialysis: Secondary | ICD-10-CM | POA: Diagnosis not present

## 2021-03-15 DIAGNOSIS — R82998 Other abnormal findings in urine: Secondary | ICD-10-CM | POA: Diagnosis not present

## 2021-03-18 DIAGNOSIS — M5136 Other intervertebral disc degeneration, lumbar region: Secondary | ICD-10-CM | POA: Diagnosis not present

## 2021-03-18 DIAGNOSIS — J449 Chronic obstructive pulmonary disease, unspecified: Secondary | ICD-10-CM | POA: Diagnosis not present

## 2021-03-18 DIAGNOSIS — M722 Plantar fascial fibromatosis: Secondary | ICD-10-CM | POA: Diagnosis not present

## 2021-03-18 DIAGNOSIS — K589 Irritable bowel syndrome without diarrhea: Secondary | ICD-10-CM | POA: Diagnosis not present

## 2021-03-18 DIAGNOSIS — M797 Fibromyalgia: Secondary | ICD-10-CM | POA: Diagnosis not present

## 2021-03-18 DIAGNOSIS — Z96653 Presence of artificial knee joint, bilateral: Secondary | ICD-10-CM | POA: Diagnosis not present

## 2021-03-18 DIAGNOSIS — K439 Ventral hernia without obstruction or gangrene: Secondary | ICD-10-CM | POA: Diagnosis not present

## 2021-03-18 DIAGNOSIS — Z79891 Long term (current) use of opiate analgesic: Secondary | ICD-10-CM | POA: Diagnosis not present

## 2021-03-18 DIAGNOSIS — E079 Disorder of thyroid, unspecified: Secondary | ICD-10-CM | POA: Diagnosis not present

## 2021-03-18 DIAGNOSIS — E1151 Type 2 diabetes mellitus with diabetic peripheral angiopathy without gangrene: Secondary | ICD-10-CM | POA: Diagnosis not present

## 2021-03-18 DIAGNOSIS — Z794 Long term (current) use of insulin: Secondary | ICD-10-CM | POA: Diagnosis not present

## 2021-03-18 DIAGNOSIS — S72002D Fracture of unspecified part of neck of left femur, subsequent encounter for closed fracture with routine healing: Secondary | ICD-10-CM | POA: Diagnosis not present

## 2021-03-18 DIAGNOSIS — F32A Depression, unspecified: Secondary | ICD-10-CM | POA: Diagnosis not present

## 2021-03-18 DIAGNOSIS — I120 Hypertensive chronic kidney disease with stage 5 chronic kidney disease or end stage renal disease: Secondary | ICD-10-CM | POA: Diagnosis not present

## 2021-03-18 DIAGNOSIS — G8929 Other chronic pain: Secondary | ICD-10-CM | POA: Diagnosis not present

## 2021-03-18 DIAGNOSIS — I2510 Atherosclerotic heart disease of native coronary artery without angina pectoris: Secondary | ICD-10-CM | POA: Diagnosis not present

## 2021-03-18 DIAGNOSIS — E1122 Type 2 diabetes mellitus with diabetic chronic kidney disease: Secondary | ICD-10-CM | POA: Diagnosis not present

## 2021-03-18 DIAGNOSIS — M899 Disorder of bone, unspecified: Secondary | ICD-10-CM | POA: Diagnosis not present

## 2021-03-18 DIAGNOSIS — F17210 Nicotine dependence, cigarettes, uncomplicated: Secondary | ICD-10-CM | POA: Diagnosis not present

## 2021-03-18 DIAGNOSIS — N186 End stage renal disease: Secondary | ICD-10-CM | POA: Diagnosis not present

## 2021-03-18 DIAGNOSIS — G4700 Insomnia, unspecified: Secondary | ICD-10-CM | POA: Diagnosis not present

## 2021-03-18 DIAGNOSIS — E7800 Pure hypercholesterolemia, unspecified: Secondary | ICD-10-CM | POA: Diagnosis not present

## 2021-03-18 DIAGNOSIS — E1142 Type 2 diabetes mellitus with diabetic polyneuropathy: Secondary | ICD-10-CM | POA: Diagnosis not present

## 2021-03-18 DIAGNOSIS — I70219 Atherosclerosis of native arteries of extremities with intermittent claudication, unspecified extremity: Secondary | ICD-10-CM | POA: Diagnosis not present

## 2021-03-25 DIAGNOSIS — E1165 Type 2 diabetes mellitus with hyperglycemia: Secondary | ICD-10-CM | POA: Diagnosis not present

## 2021-04-06 DIAGNOSIS — E1165 Type 2 diabetes mellitus with hyperglycemia: Secondary | ICD-10-CM | POA: Diagnosis not present

## 2021-04-06 DIAGNOSIS — E162 Hypoglycemia, unspecified: Secondary | ICD-10-CM | POA: Diagnosis not present

## 2021-04-06 DIAGNOSIS — N186 End stage renal disease: Secondary | ICD-10-CM | POA: Diagnosis not present

## 2021-04-06 DIAGNOSIS — R809 Proteinuria, unspecified: Secondary | ICD-10-CM | POA: Diagnosis not present

## 2021-04-06 DIAGNOSIS — I10 Essential (primary) hypertension: Secondary | ICD-10-CM | POA: Diagnosis not present

## 2021-04-06 DIAGNOSIS — G609 Hereditary and idiopathic neuropathy, unspecified: Secondary | ICD-10-CM | POA: Diagnosis not present

## 2021-04-06 DIAGNOSIS — E7800 Pure hypercholesterolemia, unspecified: Secondary | ICD-10-CM | POA: Diagnosis not present

## 2021-04-08 DIAGNOSIS — Z79891 Long term (current) use of opiate analgesic: Secondary | ICD-10-CM | POA: Diagnosis not present

## 2021-04-08 DIAGNOSIS — F17210 Nicotine dependence, cigarettes, uncomplicated: Secondary | ICD-10-CM | POA: Diagnosis not present

## 2021-04-08 DIAGNOSIS — N186 End stage renal disease: Secondary | ICD-10-CM | POA: Diagnosis not present

## 2021-04-08 DIAGNOSIS — I120 Hypertensive chronic kidney disease with stage 5 chronic kidney disease or end stage renal disease: Secondary | ICD-10-CM | POA: Diagnosis not present

## 2021-04-08 DIAGNOSIS — J449 Chronic obstructive pulmonary disease, unspecified: Secondary | ICD-10-CM | POA: Diagnosis not present

## 2021-04-08 DIAGNOSIS — Z96653 Presence of artificial knee joint, bilateral: Secondary | ICD-10-CM | POA: Diagnosis not present

## 2021-04-08 DIAGNOSIS — G8929 Other chronic pain: Secondary | ICD-10-CM | POA: Diagnosis not present

## 2021-04-08 DIAGNOSIS — E1151 Type 2 diabetes mellitus with diabetic peripheral angiopathy without gangrene: Secondary | ICD-10-CM | POA: Diagnosis not present

## 2021-04-08 DIAGNOSIS — M797 Fibromyalgia: Secondary | ICD-10-CM | POA: Diagnosis not present

## 2021-04-08 DIAGNOSIS — M722 Plantar fascial fibromatosis: Secondary | ICD-10-CM | POA: Diagnosis not present

## 2021-04-08 DIAGNOSIS — I70219 Atherosclerosis of native arteries of extremities with intermittent claudication, unspecified extremity: Secondary | ICD-10-CM | POA: Diagnosis not present

## 2021-04-08 DIAGNOSIS — M899 Disorder of bone, unspecified: Secondary | ICD-10-CM | POA: Diagnosis not present

## 2021-04-08 DIAGNOSIS — Z794 Long term (current) use of insulin: Secondary | ICD-10-CM | POA: Diagnosis not present

## 2021-04-08 DIAGNOSIS — E079 Disorder of thyroid, unspecified: Secondary | ICD-10-CM | POA: Diagnosis not present

## 2021-04-08 DIAGNOSIS — I2510 Atherosclerotic heart disease of native coronary artery without angina pectoris: Secondary | ICD-10-CM | POA: Diagnosis not present

## 2021-04-08 DIAGNOSIS — K589 Irritable bowel syndrome without diarrhea: Secondary | ICD-10-CM | POA: Diagnosis not present

## 2021-04-08 DIAGNOSIS — K439 Ventral hernia without obstruction or gangrene: Secondary | ICD-10-CM | POA: Diagnosis not present

## 2021-04-08 DIAGNOSIS — E1142 Type 2 diabetes mellitus with diabetic polyneuropathy: Secondary | ICD-10-CM | POA: Diagnosis not present

## 2021-04-08 DIAGNOSIS — E7800 Pure hypercholesterolemia, unspecified: Secondary | ICD-10-CM | POA: Diagnosis not present

## 2021-04-08 DIAGNOSIS — G4700 Insomnia, unspecified: Secondary | ICD-10-CM | POA: Diagnosis not present

## 2021-04-08 DIAGNOSIS — F32A Depression, unspecified: Secondary | ICD-10-CM | POA: Diagnosis not present

## 2021-04-08 DIAGNOSIS — M5136 Other intervertebral disc degeneration, lumbar region: Secondary | ICD-10-CM | POA: Diagnosis not present

## 2021-04-08 DIAGNOSIS — E1122 Type 2 diabetes mellitus with diabetic chronic kidney disease: Secondary | ICD-10-CM | POA: Diagnosis not present

## 2021-04-08 DIAGNOSIS — S72002D Fracture of unspecified part of neck of left femur, subsequent encounter for closed fracture with routine healing: Secondary | ICD-10-CM | POA: Diagnosis not present

## 2021-04-14 DIAGNOSIS — Z992 Dependence on renal dialysis: Secondary | ICD-10-CM | POA: Diagnosis not present

## 2021-04-14 DIAGNOSIS — N186 End stage renal disease: Secondary | ICD-10-CM | POA: Diagnosis not present

## 2021-04-15 DIAGNOSIS — Z79891 Long term (current) use of opiate analgesic: Secondary | ICD-10-CM | POA: Diagnosis not present

## 2021-04-15 DIAGNOSIS — F32A Depression, unspecified: Secondary | ICD-10-CM | POA: Diagnosis not present

## 2021-04-15 DIAGNOSIS — K439 Ventral hernia without obstruction or gangrene: Secondary | ICD-10-CM | POA: Diagnosis not present

## 2021-04-15 DIAGNOSIS — S72002D Fracture of unspecified part of neck of left femur, subsequent encounter for closed fracture with routine healing: Secondary | ICD-10-CM | POA: Diagnosis not present

## 2021-04-15 DIAGNOSIS — M899 Disorder of bone, unspecified: Secondary | ICD-10-CM | POA: Diagnosis not present

## 2021-04-15 DIAGNOSIS — Z794 Long term (current) use of insulin: Secondary | ICD-10-CM | POA: Diagnosis not present

## 2021-04-15 DIAGNOSIS — F17210 Nicotine dependence, cigarettes, uncomplicated: Secondary | ICD-10-CM | POA: Diagnosis not present

## 2021-04-15 DIAGNOSIS — Z23 Encounter for immunization: Secondary | ICD-10-CM | POA: Diagnosis not present

## 2021-04-15 DIAGNOSIS — J449 Chronic obstructive pulmonary disease, unspecified: Secondary | ICD-10-CM | POA: Diagnosis not present

## 2021-04-15 DIAGNOSIS — E079 Disorder of thyroid, unspecified: Secondary | ICD-10-CM | POA: Diagnosis not present

## 2021-04-15 DIAGNOSIS — I2510 Atherosclerotic heart disease of native coronary artery without angina pectoris: Secondary | ICD-10-CM | POA: Diagnosis not present

## 2021-04-15 DIAGNOSIS — N186 End stage renal disease: Secondary | ICD-10-CM | POA: Diagnosis not present

## 2021-04-15 DIAGNOSIS — Z96653 Presence of artificial knee joint, bilateral: Secondary | ICD-10-CM | POA: Diagnosis not present

## 2021-04-15 DIAGNOSIS — D509 Iron deficiency anemia, unspecified: Secondary | ICD-10-CM | POA: Diagnosis not present

## 2021-04-15 DIAGNOSIS — G8929 Other chronic pain: Secondary | ICD-10-CM | POA: Diagnosis not present

## 2021-04-15 DIAGNOSIS — G4700 Insomnia, unspecified: Secondary | ICD-10-CM | POA: Diagnosis not present

## 2021-04-15 DIAGNOSIS — E1142 Type 2 diabetes mellitus with diabetic polyneuropathy: Secondary | ICD-10-CM | POA: Diagnosis not present

## 2021-04-15 DIAGNOSIS — M797 Fibromyalgia: Secondary | ICD-10-CM | POA: Diagnosis not present

## 2021-04-15 DIAGNOSIS — K769 Liver disease, unspecified: Secondary | ICD-10-CM | POA: Diagnosis not present

## 2021-04-15 DIAGNOSIS — M722 Plantar fascial fibromatosis: Secondary | ICD-10-CM | POA: Diagnosis not present

## 2021-04-15 DIAGNOSIS — R82998 Other abnormal findings in urine: Secondary | ICD-10-CM | POA: Diagnosis not present

## 2021-04-15 DIAGNOSIS — Z992 Dependence on renal dialysis: Secondary | ICD-10-CM | POA: Diagnosis not present

## 2021-04-15 DIAGNOSIS — E1151 Type 2 diabetes mellitus with diabetic peripheral angiopathy without gangrene: Secondary | ICD-10-CM | POA: Diagnosis not present

## 2021-04-15 DIAGNOSIS — D631 Anemia in chronic kidney disease: Secondary | ICD-10-CM | POA: Diagnosis not present

## 2021-04-15 DIAGNOSIS — M5136 Other intervertebral disc degeneration, lumbar region: Secondary | ICD-10-CM | POA: Diagnosis not present

## 2021-04-15 DIAGNOSIS — E1129 Type 2 diabetes mellitus with other diabetic kidney complication: Secondary | ICD-10-CM | POA: Diagnosis not present

## 2021-04-15 DIAGNOSIS — N2589 Other disorders resulting from impaired renal tubular function: Secondary | ICD-10-CM | POA: Diagnosis not present

## 2021-04-15 DIAGNOSIS — I70219 Atherosclerosis of native arteries of extremities with intermittent claudication, unspecified extremity: Secondary | ICD-10-CM | POA: Diagnosis not present

## 2021-04-15 DIAGNOSIS — I120 Hypertensive chronic kidney disease with stage 5 chronic kidney disease or end stage renal disease: Secondary | ICD-10-CM | POA: Diagnosis not present

## 2021-04-15 DIAGNOSIS — K589 Irritable bowel syndrome without diarrhea: Secondary | ICD-10-CM | POA: Diagnosis not present

## 2021-04-15 DIAGNOSIS — N2581 Secondary hyperparathyroidism of renal origin: Secondary | ICD-10-CM | POA: Diagnosis not present

## 2021-04-15 DIAGNOSIS — E7800 Pure hypercholesterolemia, unspecified: Secondary | ICD-10-CM | POA: Diagnosis not present

## 2021-04-15 DIAGNOSIS — E875 Hyperkalemia: Secondary | ICD-10-CM | POA: Diagnosis not present

## 2021-04-15 DIAGNOSIS — E1122 Type 2 diabetes mellitus with diabetic chronic kidney disease: Secondary | ICD-10-CM | POA: Diagnosis not present

## 2021-04-22 DIAGNOSIS — M5010 Cervical disc disorder with radiculopathy, unspecified cervical region: Secondary | ICD-10-CM | POA: Diagnosis not present

## 2021-04-22 DIAGNOSIS — E118 Type 2 diabetes mellitus with unspecified complications: Secondary | ICD-10-CM | POA: Diagnosis not present

## 2021-04-22 DIAGNOSIS — Z794 Long term (current) use of insulin: Secondary | ICD-10-CM | POA: Diagnosis not present

## 2021-04-22 DIAGNOSIS — N2581 Secondary hyperparathyroidism of renal origin: Secondary | ICD-10-CM | POA: Diagnosis not present

## 2021-04-22 DIAGNOSIS — N186 End stage renal disease: Secondary | ICD-10-CM | POA: Diagnosis not present

## 2021-04-22 DIAGNOSIS — Z0000 Encounter for general adult medical examination without abnormal findings: Secondary | ICD-10-CM | POA: Diagnosis not present

## 2021-04-22 DIAGNOSIS — I5033 Acute on chronic diastolic (congestive) heart failure: Secondary | ICD-10-CM | POA: Diagnosis not present

## 2021-04-24 DIAGNOSIS — N186 End stage renal disease: Secondary | ICD-10-CM | POA: Diagnosis not present

## 2021-04-24 DIAGNOSIS — E261 Secondary hyperaldosteronism: Secondary | ICD-10-CM | POA: Diagnosis not present

## 2021-04-24 DIAGNOSIS — Z6832 Body mass index (BMI) 32.0-32.9, adult: Secondary | ICD-10-CM | POA: Diagnosis not present

## 2021-04-24 DIAGNOSIS — I509 Heart failure, unspecified: Secondary | ICD-10-CM | POA: Diagnosis not present

## 2021-04-24 DIAGNOSIS — F3342 Major depressive disorder, recurrent, in full remission: Secondary | ICD-10-CM | POA: Diagnosis not present

## 2021-04-24 DIAGNOSIS — E1165 Type 2 diabetes mellitus with hyperglycemia: Secondary | ICD-10-CM | POA: Diagnosis not present

## 2021-04-24 DIAGNOSIS — E1151 Type 2 diabetes mellitus with diabetic peripheral angiopathy without gangrene: Secondary | ICD-10-CM | POA: Diagnosis not present

## 2021-04-24 DIAGNOSIS — E1122 Type 2 diabetes mellitus with diabetic chronic kidney disease: Secondary | ICD-10-CM | POA: Diagnosis not present

## 2021-04-24 DIAGNOSIS — I132 Hypertensive heart and chronic kidney disease with heart failure and with stage 5 chronic kidney disease, or end stage renal disease: Secondary | ICD-10-CM | POA: Diagnosis not present

## 2021-04-24 DIAGNOSIS — N2581 Secondary hyperparathyroidism of renal origin: Secondary | ICD-10-CM | POA: Diagnosis not present

## 2021-04-24 DIAGNOSIS — E1142 Type 2 diabetes mellitus with diabetic polyneuropathy: Secondary | ICD-10-CM | POA: Diagnosis not present

## 2021-04-24 DIAGNOSIS — J449 Chronic obstructive pulmonary disease, unspecified: Secondary | ICD-10-CM | POA: Diagnosis not present

## 2021-05-05 DIAGNOSIS — K589 Irritable bowel syndrome without diarrhea: Secondary | ICD-10-CM | POA: Diagnosis not present

## 2021-05-05 DIAGNOSIS — J449 Chronic obstructive pulmonary disease, unspecified: Secondary | ICD-10-CM | POA: Diagnosis not present

## 2021-05-05 DIAGNOSIS — F17210 Nicotine dependence, cigarettes, uncomplicated: Secondary | ICD-10-CM | POA: Diagnosis not present

## 2021-05-05 DIAGNOSIS — G4700 Insomnia, unspecified: Secondary | ICD-10-CM | POA: Diagnosis not present

## 2021-05-05 DIAGNOSIS — E1122 Type 2 diabetes mellitus with diabetic chronic kidney disease: Secondary | ICD-10-CM | POA: Diagnosis not present

## 2021-05-05 DIAGNOSIS — Z794 Long term (current) use of insulin: Secondary | ICD-10-CM | POA: Diagnosis not present

## 2021-05-05 DIAGNOSIS — M797 Fibromyalgia: Secondary | ICD-10-CM | POA: Diagnosis not present

## 2021-05-05 DIAGNOSIS — I70219 Atherosclerosis of native arteries of extremities with intermittent claudication, unspecified extremity: Secondary | ICD-10-CM | POA: Diagnosis not present

## 2021-05-05 DIAGNOSIS — K439 Ventral hernia without obstruction or gangrene: Secondary | ICD-10-CM | POA: Diagnosis not present

## 2021-05-05 DIAGNOSIS — N186 End stage renal disease: Secondary | ICD-10-CM | POA: Diagnosis not present

## 2021-05-05 DIAGNOSIS — M5136 Other intervertebral disc degeneration, lumbar region: Secondary | ICD-10-CM | POA: Diagnosis not present

## 2021-05-05 DIAGNOSIS — E079 Disorder of thyroid, unspecified: Secondary | ICD-10-CM | POA: Diagnosis not present

## 2021-05-05 DIAGNOSIS — E1151 Type 2 diabetes mellitus with diabetic peripheral angiopathy without gangrene: Secondary | ICD-10-CM | POA: Diagnosis not present

## 2021-05-05 DIAGNOSIS — G8929 Other chronic pain: Secondary | ICD-10-CM | POA: Diagnosis not present

## 2021-05-05 DIAGNOSIS — I2510 Atherosclerotic heart disease of native coronary artery without angina pectoris: Secondary | ICD-10-CM | POA: Diagnosis not present

## 2021-05-05 DIAGNOSIS — F32A Depression, unspecified: Secondary | ICD-10-CM | POA: Diagnosis not present

## 2021-05-05 DIAGNOSIS — E1142 Type 2 diabetes mellitus with diabetic polyneuropathy: Secondary | ICD-10-CM | POA: Diagnosis not present

## 2021-05-05 DIAGNOSIS — I120 Hypertensive chronic kidney disease with stage 5 chronic kidney disease or end stage renal disease: Secondary | ICD-10-CM | POA: Diagnosis not present

## 2021-05-05 DIAGNOSIS — M899 Disorder of bone, unspecified: Secondary | ICD-10-CM | POA: Diagnosis not present

## 2021-05-05 DIAGNOSIS — Z79891 Long term (current) use of opiate analgesic: Secondary | ICD-10-CM | POA: Diagnosis not present

## 2021-05-05 DIAGNOSIS — Z96653 Presence of artificial knee joint, bilateral: Secondary | ICD-10-CM | POA: Diagnosis not present

## 2021-05-05 DIAGNOSIS — S72002D Fracture of unspecified part of neck of left femur, subsequent encounter for closed fracture with routine healing: Secondary | ICD-10-CM | POA: Diagnosis not present

## 2021-05-05 DIAGNOSIS — M722 Plantar fascial fibromatosis: Secondary | ICD-10-CM | POA: Diagnosis not present

## 2021-05-05 DIAGNOSIS — E7800 Pure hypercholesterolemia, unspecified: Secondary | ICD-10-CM | POA: Diagnosis not present

## 2021-05-08 DIAGNOSIS — Z794 Long term (current) use of insulin: Secondary | ICD-10-CM | POA: Diagnosis not present

## 2021-05-08 DIAGNOSIS — E118 Type 2 diabetes mellitus with unspecified complications: Secondary | ICD-10-CM | POA: Diagnosis not present

## 2021-05-08 DIAGNOSIS — L03114 Cellulitis of left upper limb: Secondary | ICD-10-CM | POA: Diagnosis not present

## 2021-05-08 DIAGNOSIS — N186 End stage renal disease: Secondary | ICD-10-CM | POA: Diagnosis not present

## 2021-05-14 DIAGNOSIS — N186 End stage renal disease: Secondary | ICD-10-CM | POA: Diagnosis not present

## 2021-05-14 DIAGNOSIS — Z992 Dependence on renal dialysis: Secondary | ICD-10-CM | POA: Diagnosis not present

## 2021-05-15 DIAGNOSIS — N2589 Other disorders resulting from impaired renal tubular function: Secondary | ICD-10-CM | POA: Diagnosis not present

## 2021-05-15 DIAGNOSIS — E440 Moderate protein-calorie malnutrition: Secondary | ICD-10-CM | POA: Diagnosis not present

## 2021-05-15 DIAGNOSIS — N186 End stage renal disease: Secondary | ICD-10-CM | POA: Diagnosis not present

## 2021-05-15 DIAGNOSIS — Z992 Dependence on renal dialysis: Secondary | ICD-10-CM | POA: Diagnosis not present

## 2021-05-15 DIAGNOSIS — D631 Anemia in chronic kidney disease: Secondary | ICD-10-CM | POA: Diagnosis not present

## 2021-05-15 DIAGNOSIS — N2581 Secondary hyperparathyroidism of renal origin: Secondary | ICD-10-CM | POA: Diagnosis not present

## 2021-05-15 DIAGNOSIS — Z79899 Other long term (current) drug therapy: Secondary | ICD-10-CM | POA: Diagnosis not present

## 2021-05-15 DIAGNOSIS — R82998 Other abnormal findings in urine: Secondary | ICD-10-CM | POA: Diagnosis not present

## 2021-05-15 DIAGNOSIS — D509 Iron deficiency anemia, unspecified: Secondary | ICD-10-CM | POA: Diagnosis not present

## 2021-05-25 DIAGNOSIS — E1165 Type 2 diabetes mellitus with hyperglycemia: Secondary | ICD-10-CM | POA: Diagnosis not present

## 2021-05-27 DIAGNOSIS — M810 Age-related osteoporosis without current pathological fracture: Secondary | ICD-10-CM | POA: Diagnosis not present

## 2021-06-01 DIAGNOSIS — L601 Onycholysis: Secondary | ICD-10-CM | POA: Diagnosis not present

## 2021-06-01 DIAGNOSIS — L608 Other nail disorders: Secondary | ICD-10-CM | POA: Diagnosis not present

## 2021-06-05 DIAGNOSIS — L601 Onycholysis: Secondary | ICD-10-CM | POA: Diagnosis not present

## 2021-06-14 DIAGNOSIS — N186 End stage renal disease: Secondary | ICD-10-CM | POA: Diagnosis not present

## 2021-06-14 DIAGNOSIS — Z992 Dependence on renal dialysis: Secondary | ICD-10-CM | POA: Diagnosis not present

## 2021-06-15 DIAGNOSIS — R1084 Generalized abdominal pain: Secondary | ICD-10-CM | POA: Diagnosis not present

## 2021-06-15 DIAGNOSIS — N2589 Other disorders resulting from impaired renal tubular function: Secondary | ICD-10-CM | POA: Diagnosis not present

## 2021-06-15 DIAGNOSIS — N2581 Secondary hyperparathyroidism of renal origin: Secondary | ICD-10-CM | POA: Diagnosis not present

## 2021-06-15 DIAGNOSIS — D631 Anemia in chronic kidney disease: Secondary | ICD-10-CM | POA: Diagnosis not present

## 2021-06-15 DIAGNOSIS — Z992 Dependence on renal dialysis: Secondary | ICD-10-CM | POA: Diagnosis not present

## 2021-06-15 DIAGNOSIS — Z79899 Other long term (current) drug therapy: Secondary | ICD-10-CM | POA: Diagnosis not present

## 2021-06-15 DIAGNOSIS — E440 Moderate protein-calorie malnutrition: Secondary | ICD-10-CM | POA: Diagnosis not present

## 2021-06-15 DIAGNOSIS — R82998 Other abnormal findings in urine: Secondary | ICD-10-CM | POA: Diagnosis not present

## 2021-06-15 DIAGNOSIS — N186 End stage renal disease: Secondary | ICD-10-CM | POA: Diagnosis not present

## 2021-06-15 DIAGNOSIS — D509 Iron deficiency anemia, unspecified: Secondary | ICD-10-CM | POA: Diagnosis not present

## 2021-06-23 DIAGNOSIS — M545 Low back pain, unspecified: Secondary | ICD-10-CM | POA: Diagnosis not present

## 2021-06-23 DIAGNOSIS — S72002A Fracture of unspecified part of neck of left femur, initial encounter for closed fracture: Secondary | ICD-10-CM | POA: Diagnosis not present

## 2021-06-23 DIAGNOSIS — M5416 Radiculopathy, lumbar region: Secondary | ICD-10-CM | POA: Diagnosis not present

## 2021-06-25 DIAGNOSIS — E1165 Type 2 diabetes mellitus with hyperglycemia: Secondary | ICD-10-CM | POA: Diagnosis not present

## 2021-06-26 DIAGNOSIS — M810 Age-related osteoporosis without current pathological fracture: Secondary | ICD-10-CM | POA: Diagnosis not present

## 2021-06-26 DIAGNOSIS — N186 End stage renal disease: Secondary | ICD-10-CM | POA: Diagnosis not present

## 2021-06-26 DIAGNOSIS — M5416 Radiculopathy, lumbar region: Secondary | ICD-10-CM | POA: Diagnosis not present

## 2021-06-26 DIAGNOSIS — E1122 Type 2 diabetes mellitus with diabetic chronic kidney disease: Secondary | ICD-10-CM | POA: Diagnosis not present

## 2021-06-26 DIAGNOSIS — E7800 Pure hypercholesterolemia, unspecified: Secondary | ICD-10-CM | POA: Diagnosis not present

## 2021-06-26 DIAGNOSIS — Z7982 Long term (current) use of aspirin: Secondary | ICD-10-CM | POA: Diagnosis not present

## 2021-06-26 DIAGNOSIS — S72002D Fracture of unspecified part of neck of left femur, subsequent encounter for closed fracture with routine healing: Secondary | ICD-10-CM | POA: Diagnosis not present

## 2021-06-26 DIAGNOSIS — Z794 Long term (current) use of insulin: Secondary | ICD-10-CM | POA: Diagnosis not present

## 2021-06-26 DIAGNOSIS — F17210 Nicotine dependence, cigarettes, uncomplicated: Secondary | ICD-10-CM | POA: Diagnosis not present

## 2021-06-26 DIAGNOSIS — B20 Human immunodeficiency virus [HIV] disease: Secondary | ICD-10-CM | POA: Diagnosis not present

## 2021-06-26 DIAGNOSIS — Z96653 Presence of artificial knee joint, bilateral: Secondary | ICD-10-CM | POA: Diagnosis not present

## 2021-06-26 DIAGNOSIS — I120 Hypertensive chronic kidney disease with stage 5 chronic kidney disease or end stage renal disease: Secondary | ICD-10-CM | POA: Diagnosis not present

## 2021-06-26 DIAGNOSIS — Z9181 History of falling: Secondary | ICD-10-CM | POA: Diagnosis not present

## 2021-07-01 DIAGNOSIS — M810 Age-related osteoporosis without current pathological fracture: Secondary | ICD-10-CM | POA: Diagnosis not present

## 2021-07-01 DIAGNOSIS — F17210 Nicotine dependence, cigarettes, uncomplicated: Secondary | ICD-10-CM | POA: Diagnosis not present

## 2021-07-01 DIAGNOSIS — E1122 Type 2 diabetes mellitus with diabetic chronic kidney disease: Secondary | ICD-10-CM | POA: Diagnosis not present

## 2021-07-01 DIAGNOSIS — Z7982 Long term (current) use of aspirin: Secondary | ICD-10-CM | POA: Diagnosis not present

## 2021-07-01 DIAGNOSIS — N186 End stage renal disease: Secondary | ICD-10-CM | POA: Diagnosis not present

## 2021-07-01 DIAGNOSIS — B20 Human immunodeficiency virus [HIV] disease: Secondary | ICD-10-CM | POA: Diagnosis not present

## 2021-07-01 DIAGNOSIS — M5416 Radiculopathy, lumbar region: Secondary | ICD-10-CM | POA: Diagnosis not present

## 2021-07-01 DIAGNOSIS — I120 Hypertensive chronic kidney disease with stage 5 chronic kidney disease or end stage renal disease: Secondary | ICD-10-CM | POA: Diagnosis not present

## 2021-07-01 DIAGNOSIS — Z96653 Presence of artificial knee joint, bilateral: Secondary | ICD-10-CM | POA: Diagnosis not present

## 2021-07-01 DIAGNOSIS — S72002D Fracture of unspecified part of neck of left femur, subsequent encounter for closed fracture with routine healing: Secondary | ICD-10-CM | POA: Diagnosis not present

## 2021-07-01 DIAGNOSIS — E7800 Pure hypercholesterolemia, unspecified: Secondary | ICD-10-CM | POA: Diagnosis not present

## 2021-07-01 DIAGNOSIS — Z794 Long term (current) use of insulin: Secondary | ICD-10-CM | POA: Diagnosis not present

## 2021-07-01 DIAGNOSIS — Z9181 History of falling: Secondary | ICD-10-CM | POA: Diagnosis not present

## 2021-07-14 DIAGNOSIS — R197 Diarrhea, unspecified: Secondary | ICD-10-CM | POA: Diagnosis not present

## 2021-07-14 DIAGNOSIS — R110 Nausea: Secondary | ICD-10-CM | POA: Diagnosis not present

## 2021-07-15 DIAGNOSIS — Z992 Dependence on renal dialysis: Secondary | ICD-10-CM | POA: Diagnosis not present

## 2021-07-15 DIAGNOSIS — R1110 Vomiting, unspecified: Secondary | ICD-10-CM | POA: Diagnosis not present

## 2021-07-15 DIAGNOSIS — R112 Nausea with vomiting, unspecified: Secondary | ICD-10-CM | POA: Diagnosis not present

## 2021-07-15 DIAGNOSIS — N186 End stage renal disease: Secondary | ICD-10-CM | POA: Diagnosis not present

## 2021-07-15 DIAGNOSIS — R197 Diarrhea, unspecified: Secondary | ICD-10-CM | POA: Diagnosis not present

## 2021-07-16 DIAGNOSIS — Z992 Dependence on renal dialysis: Secondary | ICD-10-CM | POA: Diagnosis not present

## 2021-07-16 DIAGNOSIS — N186 End stage renal disease: Secondary | ICD-10-CM | POA: Diagnosis not present

## 2021-07-16 DIAGNOSIS — E1129 Type 2 diabetes mellitus with other diabetic kidney complication: Secondary | ICD-10-CM | POA: Diagnosis not present

## 2021-07-16 DIAGNOSIS — E875 Hyperkalemia: Secondary | ICD-10-CM | POA: Diagnosis not present

## 2021-07-16 DIAGNOSIS — N2589 Other disorders resulting from impaired renal tubular function: Secondary | ICD-10-CM | POA: Diagnosis not present

## 2021-07-16 DIAGNOSIS — D509 Iron deficiency anemia, unspecified: Secondary | ICD-10-CM | POA: Diagnosis not present

## 2021-07-16 DIAGNOSIS — D631 Anemia in chronic kidney disease: Secondary | ICD-10-CM | POA: Diagnosis not present

## 2021-07-16 DIAGNOSIS — N2581 Secondary hyperparathyroidism of renal origin: Secondary | ICD-10-CM | POA: Diagnosis not present

## 2021-07-16 DIAGNOSIS — R82998 Other abnormal findings in urine: Secondary | ICD-10-CM | POA: Diagnosis not present

## 2021-07-17 DIAGNOSIS — S30814A Abrasion of vagina and vulva, initial encounter: Secondary | ICD-10-CM | POA: Diagnosis not present

## 2021-07-17 DIAGNOSIS — Z992 Dependence on renal dialysis: Secondary | ICD-10-CM | POA: Diagnosis not present

## 2021-07-17 DIAGNOSIS — N186 End stage renal disease: Secondary | ICD-10-CM | POA: Diagnosis not present

## 2021-07-21 ENCOUNTER — Other Ambulatory Visit: Payer: Self-pay | Admitting: Internal Medicine

## 2021-07-21 DIAGNOSIS — Z1231 Encounter for screening mammogram for malignant neoplasm of breast: Secondary | ICD-10-CM

## 2021-07-22 DIAGNOSIS — F17210 Nicotine dependence, cigarettes, uncomplicated: Secondary | ICD-10-CM | POA: Diagnosis not present

## 2021-07-22 DIAGNOSIS — S72002D Fracture of unspecified part of neck of left femur, subsequent encounter for closed fracture with routine healing: Secondary | ICD-10-CM | POA: Diagnosis not present

## 2021-07-22 DIAGNOSIS — M810 Age-related osteoporosis without current pathological fracture: Secondary | ICD-10-CM | POA: Diagnosis not present

## 2021-07-22 DIAGNOSIS — E7800 Pure hypercholesterolemia, unspecified: Secondary | ICD-10-CM | POA: Diagnosis not present

## 2021-07-22 DIAGNOSIS — M5416 Radiculopathy, lumbar region: Secondary | ICD-10-CM | POA: Diagnosis not present

## 2021-07-22 DIAGNOSIS — B20 Human immunodeficiency virus [HIV] disease: Secondary | ICD-10-CM | POA: Diagnosis not present

## 2021-07-22 DIAGNOSIS — Z96653 Presence of artificial knee joint, bilateral: Secondary | ICD-10-CM | POA: Diagnosis not present

## 2021-07-22 DIAGNOSIS — Z7982 Long term (current) use of aspirin: Secondary | ICD-10-CM | POA: Diagnosis not present

## 2021-07-22 DIAGNOSIS — E1122 Type 2 diabetes mellitus with diabetic chronic kidney disease: Secondary | ICD-10-CM | POA: Diagnosis not present

## 2021-07-22 DIAGNOSIS — Z794 Long term (current) use of insulin: Secondary | ICD-10-CM | POA: Diagnosis not present

## 2021-07-22 DIAGNOSIS — I120 Hypertensive chronic kidney disease with stage 5 chronic kidney disease or end stage renal disease: Secondary | ICD-10-CM | POA: Diagnosis not present

## 2021-07-22 DIAGNOSIS — Z9181 History of falling: Secondary | ICD-10-CM | POA: Diagnosis not present

## 2021-07-22 DIAGNOSIS — N186 End stage renal disease: Secondary | ICD-10-CM | POA: Diagnosis not present

## 2021-07-24 DIAGNOSIS — R102 Pelvic and perineal pain: Secondary | ICD-10-CM | POA: Diagnosis not present

## 2021-07-24 DIAGNOSIS — R350 Frequency of micturition: Secondary | ICD-10-CM | POA: Diagnosis not present

## 2021-07-24 DIAGNOSIS — Z794 Long term (current) use of insulin: Secondary | ICD-10-CM | POA: Diagnosis not present

## 2021-07-24 DIAGNOSIS — R300 Dysuria: Secondary | ICD-10-CM | POA: Diagnosis not present

## 2021-07-24 DIAGNOSIS — R197 Diarrhea, unspecified: Secondary | ICD-10-CM | POA: Diagnosis not present

## 2021-07-27 DIAGNOSIS — E1165 Type 2 diabetes mellitus with hyperglycemia: Secondary | ICD-10-CM | POA: Diagnosis not present

## 2021-07-27 DIAGNOSIS — A09 Infectious gastroenteritis and colitis, unspecified: Secondary | ICD-10-CM | POA: Diagnosis not present

## 2021-07-27 DIAGNOSIS — K2930 Chronic superficial gastritis without bleeding: Secondary | ICD-10-CM | POA: Diagnosis not present

## 2021-07-31 DIAGNOSIS — A09 Infectious gastroenteritis and colitis, unspecified: Secondary | ICD-10-CM | POA: Diagnosis not present

## 2021-08-06 DIAGNOSIS — Z6833 Body mass index (BMI) 33.0-33.9, adult: Secondary | ICD-10-CM | POA: Diagnosis not present

## 2021-08-06 DIAGNOSIS — E1151 Type 2 diabetes mellitus with diabetic peripheral angiopathy without gangrene: Secondary | ICD-10-CM | POA: Diagnosis not present

## 2021-08-06 DIAGNOSIS — D692 Other nonthrombocytopenic purpura: Secondary | ICD-10-CM | POA: Diagnosis not present

## 2021-08-06 DIAGNOSIS — I110 Hypertensive heart disease with heart failure: Secondary | ICD-10-CM | POA: Diagnosis not present

## 2021-08-06 DIAGNOSIS — Z794 Long term (current) use of insulin: Secondary | ICD-10-CM | POA: Diagnosis not present

## 2021-08-06 DIAGNOSIS — Z992 Dependence on renal dialysis: Secondary | ICD-10-CM | POA: Diagnosis not present

## 2021-08-06 DIAGNOSIS — E1165 Type 2 diabetes mellitus with hyperglycemia: Secondary | ICD-10-CM | POA: Diagnosis not present

## 2021-08-06 DIAGNOSIS — N186 End stage renal disease: Secondary | ICD-10-CM | POA: Diagnosis not present

## 2021-08-06 DIAGNOSIS — I509 Heart failure, unspecified: Secondary | ICD-10-CM | POA: Diagnosis not present

## 2021-08-06 DIAGNOSIS — Z7982 Long term (current) use of aspirin: Secondary | ICD-10-CM | POA: Diagnosis not present

## 2021-08-06 DIAGNOSIS — E1122 Type 2 diabetes mellitus with diabetic chronic kidney disease: Secondary | ICD-10-CM | POA: Diagnosis not present

## 2021-08-06 DIAGNOSIS — E261 Secondary hyperaldosteronism: Secondary | ICD-10-CM | POA: Diagnosis not present

## 2021-08-07 DIAGNOSIS — L600 Ingrowing nail: Secondary | ICD-10-CM | POA: Diagnosis not present

## 2021-08-07 DIAGNOSIS — E1140 Type 2 diabetes mellitus with diabetic neuropathy, unspecified: Secondary | ICD-10-CM | POA: Diagnosis not present

## 2021-08-07 DIAGNOSIS — B351 Tinea unguium: Secondary | ICD-10-CM | POA: Diagnosis not present

## 2021-08-07 DIAGNOSIS — I739 Peripheral vascular disease, unspecified: Secondary | ICD-10-CM | POA: Diagnosis not present

## 2021-08-07 DIAGNOSIS — Z794 Long term (current) use of insulin: Secondary | ICD-10-CM | POA: Diagnosis not present

## 2021-08-10 DIAGNOSIS — E1165 Type 2 diabetes mellitus with hyperglycemia: Secondary | ICD-10-CM | POA: Diagnosis not present

## 2021-08-14 DIAGNOSIS — N186 End stage renal disease: Secondary | ICD-10-CM | POA: Diagnosis not present

## 2021-08-14 DIAGNOSIS — Z992 Dependence on renal dialysis: Secondary | ICD-10-CM | POA: Diagnosis not present

## 2021-08-15 DIAGNOSIS — N2581 Secondary hyperparathyroidism of renal origin: Secondary | ICD-10-CM | POA: Diagnosis not present

## 2021-08-15 DIAGNOSIS — N2589 Other disorders resulting from impaired renal tubular function: Secondary | ICD-10-CM | POA: Diagnosis not present

## 2021-08-15 DIAGNOSIS — N186 End stage renal disease: Secondary | ICD-10-CM | POA: Diagnosis not present

## 2021-08-15 DIAGNOSIS — E440 Moderate protein-calorie malnutrition: Secondary | ICD-10-CM | POA: Diagnosis not present

## 2021-08-15 DIAGNOSIS — Z23 Encounter for immunization: Secondary | ICD-10-CM | POA: Diagnosis not present

## 2021-08-15 DIAGNOSIS — Z79899 Other long term (current) drug therapy: Secondary | ICD-10-CM | POA: Diagnosis not present

## 2021-08-15 DIAGNOSIS — D509 Iron deficiency anemia, unspecified: Secondary | ICD-10-CM | POA: Diagnosis not present

## 2021-08-15 DIAGNOSIS — R82998 Other abnormal findings in urine: Secondary | ICD-10-CM | POA: Diagnosis not present

## 2021-08-15 DIAGNOSIS — D631 Anemia in chronic kidney disease: Secondary | ICD-10-CM | POA: Diagnosis not present

## 2021-08-15 DIAGNOSIS — Z992 Dependence on renal dialysis: Secondary | ICD-10-CM | POA: Diagnosis not present

## 2021-08-18 ENCOUNTER — Ambulatory Visit: Payer: PPO | Admitting: Cardiovascular Disease

## 2021-08-19 ENCOUNTER — Encounter: Payer: Self-pay | Admitting: Cardiovascular Disease

## 2021-08-19 ENCOUNTER — Other Ambulatory Visit: Payer: Self-pay

## 2021-08-19 ENCOUNTER — Ambulatory Visit (INDEPENDENT_AMBULATORY_CARE_PROVIDER_SITE_OTHER): Payer: PPO | Admitting: Cardiovascular Disease

## 2021-08-19 VITALS — BP 128/64 | HR 101 | Ht 59.0 in | Wt 169.2 lb

## 2021-08-19 DIAGNOSIS — I70212 Atherosclerosis of native arteries of extremities with intermittent claudication, left leg: Secondary | ICD-10-CM

## 2021-08-19 DIAGNOSIS — I10 Essential (primary) hypertension: Secondary | ICD-10-CM | POA: Diagnosis not present

## 2021-08-19 DIAGNOSIS — I739 Peripheral vascular disease, unspecified: Secondary | ICD-10-CM | POA: Diagnosis not present

## 2021-08-19 DIAGNOSIS — I25118 Atherosclerotic heart disease of native coronary artery with other forms of angina pectoris: Secondary | ICD-10-CM

## 2021-08-19 MED ORDER — NITROGLYCERIN 0.4 MG SL SUBL: 0.4000 mg | SUBLINGUAL_TABLET | SUBLINGUAL | 3 refills | Status: AC | PRN

## 2021-08-19 MED ORDER — METOPROLOL SUCCINATE ER 25 MG PO TB24: 25.0000 mg | ORAL_TABLET | Freq: Every day | ORAL | 3 refills | Status: DC

## 2021-08-19 MED ORDER — FUROSEMIDE 80 MG PO TABS: 80.0000 mg | ORAL_TABLET | Freq: Every day | ORAL | 3 refills | Status: AC

## 2021-08-19 NOTE — Progress Notes (Signed)
Date:  08/19/2021   ID:  Edgardo Roys, DOB 1946/06/19, MRN [id number]  Patient Location:  [place] [id number]   Provider location:   [facility], [place] office  PCP:  Rusty Aus, MD  Cardiologist:  Arvid Right [facility]  Chief Complaint  Patient presents with   Other    12 month f/u c/o being told her heart rate was irregular. Meds reviewed verbally with pt.    History of Present Illness:    Andrea Stewart is a 75 y.o. female past medical history of obesity,  COPD, smoking poorly controlled diabetes, followed by Dr. Soyla Murphy,  HBA1C 11 prior smoking history , Cardiac catheterization by report more than 7 years ago that showed no significant CAD. 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  LE arterial disease, followed by dr. Lucky Cowboy, ABI 0.8  who presents for routine followup of her shortness of breath, hypertension.  Last seen by myself through telemetry visit May 2020 Significant medical issues On PD Ankle swelling, very mild Off coreg, BP too low  Was told by visiting nurse to the house that she might have atrial fibrillation  Poor balance, Followed by orthopedics Had a fall, secondary to the trauma, "Cage moved in back" Tramadol for sleep, xanax  Long history of smoking Previously on torsemide, reports now she is taking Lasix 80 daily  Denies any chest pain on exertion   history of leg cramping   Lab work reviewed A1C 9.8  EKG personally reviewed by myself on todays visit NSR, runs of atrial tachycardia, low voltage   Followed by Dr. Lucky Cowboy  Followed by Abigail Butts Chronic renal dysfunction  Balin, endocrine in [place]  She is unable to tolerate amlodipine secondary to leg swelling, Diovan HCT (leg cramping) losartan (patient self discontinued after no BP response in a weekend - though it would take longer to see a response), valsartan (uncertain reason),  and clonidine (fatigue).      Prior CV studies:   The following studies were reviewed today:  Cardiac catheterization by report more than 7 years ago that showed no significant CAD. She was previously seen by Dr. Olevia Perches.  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 Limited secondary to her arthritis conditions.    Past Medical History:  Diagnosis Date   Backache, unspecified    s/p c-spine and l-spine fusions   COPD (chronic obstructive pulmonary disease) (HCC)    Degeneration of intervertebral disc, site unspecified    Depressive disorder, not elsewhere classified    Disorder of bone and cartilage, unspecified    Family history of adverse reaction to anesthesia    mother did, but patient not sure of the complication   HTN (hypertension)    Hx of left heart catheterization by cutdown    a. 7 years ago; no significant cad; b. Lexiscan 2014: no significant ischemia, no significant EKG changes concerning for ischemia, EF 67%, overall low risk study   Kidney failure    Dr Cyndia Diver   Myalgia and myositis, unspecified    Obesity, unspecified    Personal history of tobacco use, presenting hazards to health    1/2 ppd   Plantar fascial fibromatosis    S/P cholecystectomy    S/p nephrectomy    Type II or unspecified type diabetes mellitus without mention of complication, not stated  as uncontrolled    Unspecified essential hypertension    Unspecified hereditary and idiopathic peripheral neuropathy    secondary to diabetes   Past Surgical History:  Procedure Laterality Date   ABDOMINAL HYSTERECTOMY     APPENDECTOMY     BACK SURGERY     1966 and 2006    CARDIAC CATHETERIZATION     o.k. 2003   CARPAL TUNNEL RELEASE     CERVICAL DISCECTOMY     CHOLECYSTECTOMY     COLONOSCOPY  09/1999   colonoscopy-hemorrhoids, re-check 5 years (10/2006)   dexa     osteopenia (01/2004)   DIALYSIS/PERMA CATHETER INSERTION Right  01/05/2021   Procedure: DIALYSIS/PERMA CATHETER INSERTION;  Surgeon: Algernon Huxley, MD;  Location: [facility];  Service: Cardiovascular;  Laterality: Right;   DIALYSIS/PERMA CATHETER REMOVAL N/A 02/05/2021   Procedure: DIALYSIS/PERMA CATHETER REMOVAL;  Surgeon: Algernon Huxley, MD;  Location: [facility];  Service: Cardiovascular;  Laterality: N/A;   ELBOW SURGERY     ESOPHAGOGASTRODUODENOSCOPY (EGD) WITH PROPOFOL N/A 07/23/2020   Procedure: ESOPHAGOGASTRODUODENOSCOPY (EGD) WITH PROPOFOL;  Surgeon: Robert Bellow, MD;  Location: [facility];  Service: Endoscopy;  Laterality: N/A;  TRAVEL TO O.R. - PATIENT TO HAVE SURGERY   FEMUR IM NAIL Left 01/01/2021   Procedure: INTRAMEDULLARY (IM) NAIL FEMORAL;  Surgeon: Lovell Sheehan, MD;  Location: [facility];  Service: Orthopedics;  Laterality: Left;   FOOT SURGERY     x 3   HIP FRACTURE SURGERY Left    JOINT REPLACEMENT     KIDNEY DONATION  1991   LAPAROSCOPIC ABDOMINAL EXPLORATION N/A 07/23/2020   Procedure: LAPAROSCOPIC ABDOMINAL EXPLORATION;  Surgeon: Robert Bellow, MD;  Location: [facility];  Service: General;  Laterality: N/A;   LUMBAR FUSION     NECK SURGERY     06/2005   Problem with general Anesthesia     02/2006   REPLACEMENT TOTAL KNEE  10/2004   trigger finger surgery     UPPER GI ENDOSCOPY N/A 07/23/2020   Procedure: UPPER GI ENDOSCOPY;  Surgeon: Robert Bellow, MD;  Location: [facility];  Service: General;  Laterality: N/A;   VENTRAL HERNIA REPAIR N/A 09/08/2017   Primary repair of a 10 x 15 mm infraumbilical fascial defect.  Surgeon: Robert Bellow, MD;  Location: [facility];  Service: General;  Laterality: N/A;   VESICOVAGINAL FISTULA CLOSURE W/ TAH     VIDEO BRONCHOSCOPY Bilateral 04/02/2015   Procedure: VIDEO BRONCHOSCOPY WITHOUT FLUORO;  Surgeon: Juanito Doom, MD;  [facility];  Service: Cardiopulmonary;  Laterality: Bilateral;     Current Meds  Medication Sig   acetaminophen  (TYLENOL) 325 MG tablet Take 2 tablets (650 mg total) by mouth every 6 (six) hours as needed for mild pain or headache (headache).   ALPRAZolam (XANAX) 0.5 MG tablet Take by mouth.   aspirin EC 81 MG EC tablet Take 1 tablet (81 mg total) by mouth 2 (two) times daily. Swallow whole.   atorvastatin (LIPITOR) 20 MG tablet TAKE 1 TABLET BY MOUTH DAILY (Patient taking differently: Take 20 mg by mouth daily with supper.)   budesonide-formoterol (SYMBICORT) 160-4.5 MCG/ACT inhaler Inhale into the lungs.   Continuous Blood Gluc Receiver ([place]) DEVI See admin instructions.   ergocalciferol (VITAMIN D2) 1.25 MG (50000 UT) capsule Take by mouth once a week.   furosemide (LASIX) 80 MG tablet Take 80 mg by mouth daily.   hydrOXYzine (ATARAX/VISTARIL) 25 MG tablet Take 1 tablet (  25 mg total) by mouth 2 (two) times daily as needed for anxiety.   insulin NPH Human (NOVOLIN N) 100 UNIT/ML injection Novolin N NPH U-100 Insulin isophane 100 unit/mL subcutaneous susp  INJECT 20 UNITS IN THE MORNING AND 35 UNITS IN THE EVENING. MAX DAILY DOSE OF 100 UNITS   insulin regular human CONCENTRATED (HUMULIN R U-500 KWIKPEN) 500 UNIT/ML KwikPen Inject into the skin as needed.   Magnesium 500 MG TABS 1 tablet with a meal   Multiple Vitamins-Minerals (KP WOMENS 50+ DAILY FORMULA) TABS Take by mouth daily.   nitroGLYCERIN (NITROSTAT) 0.4 MG SL tablet Place 1 tablet (0.4 mg total) under the tongue every 5 (five) minutes as needed. Maximum 3 tablets (Patient taking differently: Place 0.4 mg under the tongue every 5 (five) minutes x 3 doses as needed for chest pain. Maximum 3 tablets)   Nutritional Supplements (,FEEDING SUPPLEMENT, PROSOURCE PLUS) liquid Take 30 mLs by mouth daily.   Nutritional Supplements (FEEDING SUPPLEMENT, NEPRO CARB STEADY,) LIQD Take 237 mLs by mouth daily.   ondansetron (ZOFRAN) 4 MG tablet Take 1 tablet (4 mg total) by mouth every 6 (six) hours as needed for nausea.   pantoprazole (PROTONIX)  40 MG tablet Take 40 mg by mouth 2 (two) times daily.   polyethylene glycol (MIRALAX / GLYCOLAX) 17 g packet Take 17 g by mouth daily.   Sennosides (SENNA) 8.6 MG CAPS Take 1 capsule by mouth at bedtime as needed.   Spacer/Aero-Holding Chambers (AEROCHAMBER MV) inhaler Use as instructed   spironolactone (ALDACTONE) 25 MG tablet Take by mouth daily.   Current Facility-Administered Medications for the 08/19/21 encounter (Office Visit) with Minna Merritts, MD  Medication   albuterol (PROVENTIL HFA;VENTOLIN HFA) 108 (90 Base) MCG/ACT inhaler 2 puff     Allergies:   Morphine, Prednisone, Gabapentin, Hydralazine, Nsaids, and Penicillins   Social History   Tobacco Use   Smoking status: Every Day    Packs/day: 0.25    Years: 30.00    Pack years: 7.50    Types: Cigarettes   Smokeless tobacco: Never   Tobacco comments:    currently smoking .25 ppd, trying to quit 02/13/16  Substance Use Topics   Alcohol use: No    Alcohol/week: 0.0 standard drinks   Drug use: No     Family Hx: The patient's family history includes COPD in her father; Cancer in her daughter, father, and mother; Colon cancer in her mother; Coronary artery disease in her mother; Diabetes in her brother, mother, paternal aunt, and paternal uncle; Heart attack (age of onset: 65) in her brother; Hypertension in her mother; Prostate cancer in her father; Sleep apnea in her brother.  ROS:   Please see the history of present illness.    Review of Systems  Constitutional: Negative.   HENT: Negative.    Respiratory:  Positive for shortness of breath.   Cardiovascular: Negative.   Gastrointestinal: Negative.   Musculoskeletal:  Positive for joint pain.  Neurological: Negative.   Psychiatric/Behavioral: Negative.    All other systems reviewed and are negative.   Labs/Other Tests and Data Reviewed:    Recent Labs: 12/31/2020: ALT 16 01/03/2021: Magnesium 1.7 01/07/2021: Hemoglobin 9.7; Platelets 131 01/08/2021: BUN 37;  Creatinine, Ser 3.40; Potassium 3.6; Sodium 131   Recent Lipid Panel Lab Results  Component Value Date/Time   CHOL 136 08/25/2017 09:12 AM   TRIG 111.0 08/25/2017 09:12 AM   HDL 60.30 08/25/2017 09:12 AM   CHOLHDL 2 08/25/2017 09:12 AM   LDLCALC  53 08/25/2017 09:12 AM   LDLDIRECT 51.9 09/07/2012 11:02 AM    Wt Readings from Last 3 Encounters:  08/19/21 169 lb 4 oz (76.8 kg)  02/24/21 175 lb (79.4 kg)  02/05/21 175 lb (79.4 kg)     Exam:    Vital Signs: Vital signs may also be detailed in the HPI BP 128/64 (BP Location: Left Arm, Patient Position: Sitting, Cuff Size: Normal)   Pulse (!) 101   Ht '4\' 11"'$  (1.499 m)   Wt 169 lb 4 oz (76.8 kg)   SpO2 95%   BMI 34.18 kg/m   Constitutional:  oriented to person, place, and time. No distress.  HENT:  Head: Grossly normal Eyes:  no discharge. No scleral icterus.  Neck: No JVD, no carotid bruits  Cardiovascular: Regular rate and rhythm, no murmurs appreciated Trace pitting ankle swelling Pulmonary/Chest: Clear to auscultation bilaterally, no wheezes or rails Abdominal: Soft.  no distension.  no tenderness.  Musculoskeletal: Normal range of motion Neurological:  normal muscle tone. Coordination normal. No atrophy Skin: Skin warm and dry Psychiatric: normal affect, pleasant    ASSESSMENT & PLAN:    PAD (peripheral artery disease) (HCC) Followed by Dr. Lucky Cowboy, Smoking cessation recommended On Lipitor  Morbid obesity ([place]) Calorie restriction, unable to exercise  Claudication ([place]) Stable sx, HBA1C in the 9 range Some dietary indiscretion Discussed with her  Tobacco use We have encouraged her to continue to work on weaning her cigarettes and smoking cessation. She will continue to work on this and does not want any assistance with chantix.    Essential hypertension We will add metoprolol succinate 25 in the morning, EKG with runs of atrial tachycardia.  Appears to have room on her blood pressure  CKD (chronic kidney  disease), stage III ([place]) Followed by nephrology, stressed importance of diabetes control  Mixed hyperlipidemia Cholesterol is at goal on the current lipid regimen. No changes to the medications were made.  COPD (chronic obstructive pulmonary disease) with chronic bronchitis (HCC) Severe COPD, long smoking history, he denies any recent COPD exacerbation   Total encounter time more than 25 minutes  Greater than 50% was spent in counseling and coordination of care with the patient   Signed, Ida Rogue, MD  08/19/2021 3:45 PM    [place] [facility] [address] [place] [address], [place], [place] [address]

## 2021-08-19 NOTE — Patient Instructions (Addendum)
Medication Instructions:  Please START metoprolol succinate 25 mg daily  (in the morning)  Call if you need torsemide (or talk with [place])  If you need a refill on your cardiac medications before your next appointment, please call your pharmacy.   Lab work: No new labs needed  Testing/Procedures: No new testing needed  Follow-Up: At [facility], you and your health needs are our priority.  As part of our continuing mission to provide you with exceptional heart care, we have created designated Provider Care Teams.  These Care Teams include your primary Cardiologist (physician) and Advanced Practice Providers (APPs -  Physician Assistants and Nurse Practitioners) who all work together to provide you with the care you need, when you need it.  You will need a follow up appointment in 6 months  Providers on your designated Care Team:   Murray Hodgkins, NP Christell Faith, PA-C Marrianne Mood, PA-C Cadence [place], [place]  COVID-19 Vaccine Information can be found at: ShippingScam.co.uk For questions related to vaccine distribution or appointments, please email [email or web address] or call [phone].

## 2021-08-24 ENCOUNTER — Telehealth: Payer: Self-pay | Admitting: Cardiovascular Disease

## 2021-08-24 DIAGNOSIS — E118 Type 2 diabetes mellitus with unspecified complications: Secondary | ICD-10-CM | POA: Diagnosis not present

## 2021-08-24 DIAGNOSIS — Z794 Long term (current) use of insulin: Secondary | ICD-10-CM | POA: Diagnosis not present

## 2021-08-24 DIAGNOSIS — M5416 Radiculopathy, lumbar region: Secondary | ICD-10-CM | POA: Diagnosis not present

## 2021-08-24 DIAGNOSIS — E1122 Type 2 diabetes mellitus with diabetic chronic kidney disease: Secondary | ICD-10-CM | POA: Diagnosis not present

## 2021-08-24 DIAGNOSIS — B20 Human immunodeficiency virus [HIV] disease: Secondary | ICD-10-CM | POA: Diagnosis not present

## 2021-08-24 DIAGNOSIS — A0472 Enterocolitis due to Clostridium difficile, not specified as recurrent: Secondary | ICD-10-CM | POA: Diagnosis not present

## 2021-08-24 DIAGNOSIS — E7800 Pure hypercholesterolemia, unspecified: Secondary | ICD-10-CM | POA: Diagnosis not present

## 2021-08-24 DIAGNOSIS — Z96653 Presence of artificial knee joint, bilateral: Secondary | ICD-10-CM | POA: Diagnosis not present

## 2021-08-24 DIAGNOSIS — I120 Hypertensive chronic kidney disease with stage 5 chronic kidney disease or end stage renal disease: Secondary | ICD-10-CM | POA: Diagnosis not present

## 2021-08-24 DIAGNOSIS — Z23 Encounter for immunization: Secondary | ICD-10-CM | POA: Diagnosis not present

## 2021-08-24 DIAGNOSIS — N186 End stage renal disease: Secondary | ICD-10-CM | POA: Diagnosis not present

## 2021-08-24 DIAGNOSIS — M810 Age-related osteoporosis without current pathological fracture: Secondary | ICD-10-CM | POA: Diagnosis not present

## 2021-08-24 DIAGNOSIS — Z7982 Long term (current) use of aspirin: Secondary | ICD-10-CM | POA: Diagnosis not present

## 2021-08-24 DIAGNOSIS — S72002D Fracture of unspecified part of neck of left femur, subsequent encounter for closed fracture with routine healing: Secondary | ICD-10-CM | POA: Diagnosis not present

## 2021-08-24 DIAGNOSIS — Z9181 History of falling: Secondary | ICD-10-CM | POA: Diagnosis not present

## 2021-08-24 DIAGNOSIS — F17210 Nicotine dependence, cigarettes, uncomplicated: Secondary | ICD-10-CM | POA: Diagnosis not present

## 2021-08-24 NOTE — Telephone Encounter (Signed)
STAT if HR is under 50 or over 120 (normal HR is 60-100 beats per minute)  What is your heart rate? 44-68 prior to metoprolol .  Patient and PT wants to know if should take whole or half   Do you have a log of your heart rate readings (document readings)? No   Do you have any other symptoms? Congestion [place] crackles onset close to starting metoprolol , itching

## 2021-08-24 NOTE — Telephone Encounter (Signed)
Incoming triage call form PT therapist, Andrea Stewart. She reports pt t with radial HR 40-68, piror to her metoprolol. Stated unable to take a apical pulse d/t crackles in pt's lungs. Took a 3 min radial pulse with average pulse rate 58.  Advised if HR above 60 can take 1/2 of her metoprolol, if not, hold off until tomorrow, recheck pulse (radial idea) as her pulse ox "fluctuates by the second" per Andrea Stewart. If HR above 65 can take her usual 25 mg tomorrow. But monitor her HR as well as BP.   As for the lung crackles, advised to reach out to PCP for CXR to r/o PNA. Andrea Stewart reports pt has an appt today with PCP for same.   Andrea Stewart thankful for taking call with inform Andrea Stewart of her metoprolol.

## 2021-08-26 DIAGNOSIS — E1165 Type 2 diabetes mellitus with hyperglycemia: Secondary | ICD-10-CM | POA: Diagnosis not present

## 2021-09-02 ENCOUNTER — Other Ambulatory Visit: Payer: Self-pay | Admitting: Internal Medicine

## 2021-09-04 ENCOUNTER — Ambulatory Visit
Admission: RE | Admit: 2021-09-04 | Discharge: 2021-09-04 | Disposition: A | Discharge status: Home / Self Care | Payer: PPO | Source: Ambulatory Visit | Attending: Internal Medicine | Admitting: Internal Medicine

## 2021-09-04 ENCOUNTER — Other Ambulatory Visit: Payer: Self-pay

## 2021-09-04 DIAGNOSIS — Z1231 Encounter for screening mammogram for malignant neoplasm of breast: Secondary | ICD-10-CM | POA: Diagnosis not present

## 2021-09-07 DIAGNOSIS — N186 End stage renal disease: Secondary | ICD-10-CM | POA: Diagnosis not present

## 2021-09-07 DIAGNOSIS — E1122 Type 2 diabetes mellitus with diabetic chronic kidney disease: Secondary | ICD-10-CM | POA: Diagnosis not present

## 2021-09-07 DIAGNOSIS — I2510 Atherosclerotic heart disease of native coronary artery without angina pectoris: Secondary | ICD-10-CM | POA: Diagnosis not present

## 2021-09-07 DIAGNOSIS — J449 Chronic obstructive pulmonary disease, unspecified: Secondary | ICD-10-CM | POA: Diagnosis not present

## 2021-09-07 DIAGNOSIS — M5416 Radiculopathy, lumbar region: Secondary | ICD-10-CM | POA: Diagnosis not present

## 2021-09-07 DIAGNOSIS — I120 Hypertensive chronic kidney disease with stage 5 chronic kidney disease or end stage renal disease: Secondary | ICD-10-CM | POA: Diagnosis not present

## 2021-09-07 DIAGNOSIS — B20 Human immunodeficiency virus [HIV] disease: Secondary | ICD-10-CM | POA: Diagnosis not present

## 2021-09-11 DIAGNOSIS — M5136 Other intervertebral disc degeneration, lumbar region: Secondary | ICD-10-CM | POA: Diagnosis not present

## 2021-09-11 DIAGNOSIS — E079 Disorder of thyroid, unspecified: Secondary | ICD-10-CM | POA: Diagnosis not present

## 2021-09-11 DIAGNOSIS — M722 Plantar fascial fibromatosis: Secondary | ICD-10-CM | POA: Diagnosis not present

## 2021-09-11 DIAGNOSIS — N186 End stage renal disease: Secondary | ICD-10-CM | POA: Diagnosis not present

## 2021-09-11 DIAGNOSIS — J449 Chronic obstructive pulmonary disease, unspecified: Secondary | ICD-10-CM | POA: Diagnosis not present

## 2021-09-11 DIAGNOSIS — G4700 Insomnia, unspecified: Secondary | ICD-10-CM | POA: Diagnosis not present

## 2021-09-11 DIAGNOSIS — M797 Fibromyalgia: Secondary | ICD-10-CM | POA: Diagnosis not present

## 2021-09-11 DIAGNOSIS — I2510 Atherosclerotic heart disease of native coronary artery without angina pectoris: Secondary | ICD-10-CM | POA: Diagnosis not present

## 2021-09-11 DIAGNOSIS — E1142 Type 2 diabetes mellitus with diabetic polyneuropathy: Secondary | ICD-10-CM | POA: Diagnosis not present

## 2021-09-11 DIAGNOSIS — B20 Human immunodeficiency virus [HIV] disease: Secondary | ICD-10-CM | POA: Diagnosis not present

## 2021-09-11 DIAGNOSIS — M5416 Radiculopathy, lumbar region: Secondary | ICD-10-CM | POA: Diagnosis not present

## 2021-09-11 DIAGNOSIS — Z96653 Presence of artificial knee joint, bilateral: Secondary | ICD-10-CM | POA: Diagnosis not present

## 2021-09-11 DIAGNOSIS — F17210 Nicotine dependence, cigarettes, uncomplicated: Secondary | ICD-10-CM | POA: Diagnosis not present

## 2021-09-11 DIAGNOSIS — K439 Ventral hernia without obstruction or gangrene: Secondary | ICD-10-CM | POA: Diagnosis not present

## 2021-09-11 DIAGNOSIS — M810 Age-related osteoporosis without current pathological fracture: Secondary | ICD-10-CM | POA: Diagnosis not present

## 2021-09-11 DIAGNOSIS — A0472 Enterocolitis due to Clostridium difficile, not specified as recurrent: Secondary | ICD-10-CM | POA: Diagnosis not present

## 2021-09-11 DIAGNOSIS — Z794 Long term (current) use of insulin: Secondary | ICD-10-CM | POA: Diagnosis not present

## 2021-09-11 DIAGNOSIS — E1151 Type 2 diabetes mellitus with diabetic peripheral angiopathy without gangrene: Secondary | ICD-10-CM | POA: Diagnosis not present

## 2021-09-11 DIAGNOSIS — F32A Depression, unspecified: Secondary | ICD-10-CM | POA: Diagnosis not present

## 2021-09-11 DIAGNOSIS — I120 Hypertensive chronic kidney disease with stage 5 chronic kidney disease or end stage renal disease: Secondary | ICD-10-CM | POA: Diagnosis not present

## 2021-09-11 DIAGNOSIS — S72002D Fracture of unspecified part of neck of left femur, subsequent encounter for closed fracture with routine healing: Secondary | ICD-10-CM | POA: Diagnosis not present

## 2021-09-11 DIAGNOSIS — K589 Irritable bowel syndrome without diarrhea: Secondary | ICD-10-CM | POA: Diagnosis not present

## 2021-09-11 DIAGNOSIS — I70219 Atherosclerosis of native arteries of extremities with intermittent claudication, unspecified extremity: Secondary | ICD-10-CM | POA: Diagnosis not present

## 2021-09-11 DIAGNOSIS — E1122 Type 2 diabetes mellitus with diabetic chronic kidney disease: Secondary | ICD-10-CM | POA: Diagnosis not present

## 2021-09-11 DIAGNOSIS — E7800 Pure hypercholesterolemia, unspecified: Secondary | ICD-10-CM | POA: Diagnosis not present

## 2021-09-14 DIAGNOSIS — Z992 Dependence on renal dialysis: Secondary | ICD-10-CM | POA: Diagnosis not present

## 2021-09-14 DIAGNOSIS — N186 End stage renal disease: Secondary | ICD-10-CM | POA: Diagnosis not present

## 2021-09-15 DIAGNOSIS — R197 Diarrhea, unspecified: Secondary | ICD-10-CM | POA: Diagnosis not present

## 2021-09-15 DIAGNOSIS — R195 Other fecal abnormalities: Secondary | ICD-10-CM | POA: Diagnosis not present

## 2021-09-15 DIAGNOSIS — K589 Irritable bowel syndrome without diarrhea: Secondary | ICD-10-CM | POA: Diagnosis not present

## 2021-09-15 DIAGNOSIS — R1084 Generalized abdominal pain: Secondary | ICD-10-CM | POA: Diagnosis not present

## 2021-09-15 DIAGNOSIS — R1310 Dysphagia, unspecified: Secondary | ICD-10-CM | POA: Diagnosis not present

## 2021-09-15 DIAGNOSIS — Z992 Dependence on renal dialysis: Secondary | ICD-10-CM | POA: Diagnosis not present

## 2021-09-15 DIAGNOSIS — F17200 Nicotine dependence, unspecified, uncomplicated: Secondary | ICD-10-CM | POA: Diagnosis not present

## 2021-09-15 DIAGNOSIS — Z800 Family history of malignant neoplasm of digestive organs: Secondary | ICD-10-CM | POA: Diagnosis not present

## 2021-09-15 DIAGNOSIS — N2581 Secondary hyperparathyroidism of renal origin: Secondary | ICD-10-CM | POA: Diagnosis not present

## 2021-09-15 DIAGNOSIS — N186 End stage renal disease: Secondary | ICD-10-CM | POA: Diagnosis not present

## 2021-09-15 DIAGNOSIS — Z8619 Personal history of other infectious and parasitic diseases: Secondary | ICD-10-CM | POA: Diagnosis not present

## 2021-09-18 DIAGNOSIS — E1151 Type 2 diabetes mellitus with diabetic peripheral angiopathy without gangrene: Secondary | ICD-10-CM | POA: Diagnosis not present

## 2021-09-18 DIAGNOSIS — Z634 Disappearance and death of family member: Secondary | ICD-10-CM | POA: Diagnosis not present

## 2021-09-18 DIAGNOSIS — I509 Heart failure, unspecified: Secondary | ICD-10-CM | POA: Diagnosis not present

## 2021-09-18 DIAGNOSIS — E1122 Type 2 diabetes mellitus with diabetic chronic kidney disease: Secondary | ICD-10-CM | POA: Diagnosis not present

## 2021-09-18 DIAGNOSIS — F331 Major depressive disorder, recurrent, moderate: Secondary | ICD-10-CM | POA: Diagnosis not present

## 2021-09-18 DIAGNOSIS — E261 Secondary hyperaldosteronism: Secondary | ICD-10-CM | POA: Diagnosis not present

## 2021-09-18 DIAGNOSIS — N186 End stage renal disease: Secondary | ICD-10-CM | POA: Diagnosis not present

## 2021-09-18 DIAGNOSIS — E1165 Type 2 diabetes mellitus with hyperglycemia: Secondary | ICD-10-CM | POA: Diagnosis not present

## 2021-09-18 DIAGNOSIS — I110 Hypertensive heart disease with heart failure: Secondary | ICD-10-CM | POA: Diagnosis not present

## 2021-09-18 DIAGNOSIS — Z794 Long term (current) use of insulin: Secondary | ICD-10-CM | POA: Diagnosis not present

## 2021-09-18 DIAGNOSIS — Z7982 Long term (current) use of aspirin: Secondary | ICD-10-CM | POA: Diagnosis not present

## 2021-09-18 DIAGNOSIS — I25118 Atherosclerotic heart disease of native coronary artery with other forms of angina pectoris: Secondary | ICD-10-CM | POA: Diagnosis not present

## 2021-09-20 ENCOUNTER — Emergency Department: Payer: PPO

## 2021-09-20 ENCOUNTER — Other Ambulatory Visit: Payer: Self-pay

## 2021-09-20 ENCOUNTER — Encounter: Payer: Self-pay | Admitting: Radiology

## 2021-09-20 DIAGNOSIS — F419 Anxiety disorder, unspecified: Secondary | ICD-10-CM | POA: Diagnosis present

## 2021-09-20 DIAGNOSIS — Z7982 Long term (current) use of aspirin: Secondary | ICD-10-CM

## 2021-09-20 DIAGNOSIS — N2581 Secondary hyperparathyroidism of renal origin: Secondary | ICD-10-CM | POA: Diagnosis not present

## 2021-09-20 DIAGNOSIS — S72114A Nondisplaced fracture of greater trochanter of right femur, initial encounter for closed fracture: Principal | ICD-10-CM | POA: Diagnosis present

## 2021-09-20 DIAGNOSIS — F32A Depression, unspecified: Secondary | ICD-10-CM | POA: Diagnosis not present

## 2021-09-20 DIAGNOSIS — E11649 Type 2 diabetes mellitus with hypoglycemia without coma: Secondary | ICD-10-CM | POA: Diagnosis not present

## 2021-09-20 DIAGNOSIS — Y812 Prosthetic and other implants, materials and accessory general- and plastic-surgery devices associated with adverse incidents: Secondary | ICD-10-CM | POA: Diagnosis not present

## 2021-09-20 DIAGNOSIS — Z20822 Contact with and (suspected) exposure to covid-19: Secondary | ICD-10-CM | POA: Diagnosis not present

## 2021-09-20 DIAGNOSIS — Z992 Dependence on renal dialysis: Secondary | ICD-10-CM

## 2021-09-20 DIAGNOSIS — F17210 Nicotine dependence, cigarettes, uncomplicated: Secondary | ICD-10-CM | POA: Diagnosis not present

## 2021-09-20 DIAGNOSIS — E1122 Type 2 diabetes mellitus with diabetic chronic kidney disease: Secondary | ICD-10-CM | POA: Diagnosis not present

## 2021-09-20 DIAGNOSIS — Z833 Family history of diabetes mellitus: Secondary | ICD-10-CM

## 2021-09-20 DIAGNOSIS — R519 Headache, unspecified: Secondary | ICD-10-CM | POA: Diagnosis not present

## 2021-09-20 DIAGNOSIS — Z794 Long term (current) use of insulin: Secondary | ICD-10-CM

## 2021-09-20 DIAGNOSIS — N186 End stage renal disease: Secondary | ICD-10-CM | POA: Diagnosis present

## 2021-09-20 DIAGNOSIS — Z885 Allergy status to narcotic agent status: Secondary | ICD-10-CM

## 2021-09-20 DIAGNOSIS — I10 Essential (primary) hypertension: Secondary | ICD-10-CM | POA: Diagnosis not present

## 2021-09-20 DIAGNOSIS — M8580 Other specified disorders of bone density and structure, unspecified site: Secondary | ICD-10-CM | POA: Diagnosis present

## 2021-09-20 DIAGNOSIS — Z9049 Acquired absence of other specified parts of digestive tract: Secondary | ICD-10-CM

## 2021-09-20 DIAGNOSIS — D72829 Elevated white blood cell count, unspecified: Secondary | ICD-10-CM | POA: Diagnosis present

## 2021-09-20 DIAGNOSIS — E785 Hyperlipidemia, unspecified: Secondary | ICD-10-CM | POA: Diagnosis present

## 2021-09-20 DIAGNOSIS — M19011 Primary osteoarthritis, right shoulder: Secondary | ICD-10-CM | POA: Diagnosis not present

## 2021-09-20 DIAGNOSIS — E669 Obesity, unspecified: Secondary | ICD-10-CM | POA: Diagnosis present

## 2021-09-20 DIAGNOSIS — S42291A Other displaced fracture of upper end of right humerus, initial encounter for closed fracture: Secondary | ICD-10-CM | POA: Diagnosis not present

## 2021-09-20 DIAGNOSIS — S72141A Displaced intertrochanteric fracture of right femur, initial encounter for closed fracture: Secondary | ICD-10-CM | POA: Diagnosis not present

## 2021-09-20 DIAGNOSIS — E876 Hypokalemia: Secondary | ICD-10-CM | POA: Diagnosis not present

## 2021-09-20 DIAGNOSIS — Y92013 Bedroom of single-family (private) house as the place of occurrence of the external cause: Secondary | ICD-10-CM

## 2021-09-20 DIAGNOSIS — Z9889 Other specified postprocedural states: Secondary | ICD-10-CM | POA: Diagnosis not present

## 2021-09-20 DIAGNOSIS — S72144A Nondisplaced intertrochanteric fracture of right femur, initial encounter for closed fracture: Secondary | ICD-10-CM | POA: Diagnosis not present

## 2021-09-20 DIAGNOSIS — G608 Other hereditary and idiopathic neuropathies: Secondary | ICD-10-CM | POA: Diagnosis not present

## 2021-09-20 DIAGNOSIS — E1142 Type 2 diabetes mellitus with diabetic polyneuropathy: Secondary | ICD-10-CM | POA: Diagnosis not present

## 2021-09-20 DIAGNOSIS — I120 Hypertensive chronic kidney disease with stage 5 chronic kidney disease or end stage renal disease: Secondary | ICD-10-CM | POA: Diagnosis not present

## 2021-09-20 DIAGNOSIS — Z981 Arthrodesis status: Secondary | ICD-10-CM

## 2021-09-20 DIAGNOSIS — S42254A Nondisplaced fracture of greater tuberosity of right humerus, initial encounter for closed fracture: Secondary | ICD-10-CM | POA: Diagnosis not present

## 2021-09-20 DIAGNOSIS — J449 Chronic obstructive pulmonary disease, unspecified: Secondary | ICD-10-CM | POA: Diagnosis present

## 2021-09-20 DIAGNOSIS — Z8249 Family history of ischemic heart disease and other diseases of the circulatory system: Secondary | ICD-10-CM | POA: Diagnosis not present

## 2021-09-20 DIAGNOSIS — Z96659 Presence of unspecified artificial knee joint: Secondary | ICD-10-CM | POA: Diagnosis present

## 2021-09-20 DIAGNOSIS — E871 Hypo-osmolality and hyponatremia: Secondary | ICD-10-CM | POA: Diagnosis not present

## 2021-09-20 DIAGNOSIS — Z7951 Long term (current) use of inhaled steroids: Secondary | ICD-10-CM

## 2021-09-20 DIAGNOSIS — Z79899 Other long term (current) drug therapy: Secondary | ICD-10-CM

## 2021-09-20 DIAGNOSIS — Z6839 Body mass index (BMI) 39.0-39.9, adult: Secondary | ICD-10-CM | POA: Diagnosis not present

## 2021-09-20 DIAGNOSIS — W010XXA Fall on same level from slipping, tripping and stumbling without subsequent striking against object, initial encounter: Secondary | ICD-10-CM | POA: Diagnosis present

## 2021-09-20 DIAGNOSIS — Z825 Family history of asthma and other chronic lower respiratory diseases: Secondary | ICD-10-CM

## 2021-09-20 DIAGNOSIS — W19XXXA Unspecified fall, initial encounter: Secondary | ICD-10-CM | POA: Diagnosis not present

## 2021-09-20 DIAGNOSIS — Z886 Allergy status to analgesic agent status: Secondary | ICD-10-CM

## 2021-09-20 DIAGNOSIS — R079 Chest pain, unspecified: Secondary | ICD-10-CM | POA: Diagnosis not present

## 2021-09-20 DIAGNOSIS — M533 Sacrococcygeal disorders, not elsewhere classified: Secondary | ICD-10-CM | POA: Diagnosis not present

## 2021-09-20 DIAGNOSIS — T85631A Leakage of intraperitoneal dialysis catheter, initial encounter: Secondary | ICD-10-CM | POA: Diagnosis not present

## 2021-09-20 DIAGNOSIS — D631 Anemia in chronic kidney disease: Secondary | ICD-10-CM | POA: Diagnosis present

## 2021-09-20 DIAGNOSIS — S43401A Unspecified sprain of right shoulder joint, initial encounter: Secondary | ICD-10-CM | POA: Diagnosis present

## 2021-09-20 DIAGNOSIS — R609 Edema, unspecified: Secondary | ICD-10-CM | POA: Diagnosis not present

## 2021-09-20 DIAGNOSIS — Z905 Acquired absence of kidney: Secondary | ICD-10-CM

## 2021-09-20 DIAGNOSIS — Z888 Allergy status to other drugs, medicaments and biological substances status: Secondary | ICD-10-CM

## 2021-09-20 DIAGNOSIS — Z800 Family history of malignant neoplasm of digestive organs: Secondary | ICD-10-CM

## 2021-09-20 DIAGNOSIS — M25519 Pain in unspecified shoulder: Secondary | ICD-10-CM | POA: Diagnosis not present

## 2021-09-20 DIAGNOSIS — S79911A Unspecified injury of right hip, initial encounter: Secondary | ICD-10-CM | POA: Diagnosis not present

## 2021-09-20 DIAGNOSIS — Z8042 Family history of malignant neoplasm of prostate: Secondary | ICD-10-CM

## 2021-09-20 DIAGNOSIS — M25551 Pain in right hip: Secondary | ICD-10-CM | POA: Diagnosis not present

## 2021-09-20 DIAGNOSIS — E1165 Type 2 diabetes mellitus with hyperglycemia: Secondary | ICD-10-CM | POA: Diagnosis not present

## 2021-09-20 DIAGNOSIS — M25521 Pain in right elbow: Secondary | ICD-10-CM | POA: Diagnosis not present

## 2021-09-20 DIAGNOSIS — M25572 Pain in left ankle and joints of left foot: Secondary | ICD-10-CM | POA: Diagnosis not present

## 2021-09-20 DIAGNOSIS — Z880 Allergy status to penicillin: Secondary | ICD-10-CM

## 2021-09-20 DIAGNOSIS — M79601 Pain in right arm: Secondary | ICD-10-CM | POA: Diagnosis not present

## 2021-09-20 DIAGNOSIS — E878 Other disorders of electrolyte and fluid balance, not elsewhere classified: Secondary | ICD-10-CM | POA: Diagnosis not present

## 2021-09-20 DIAGNOSIS — Z90710 Acquired absence of both cervix and uterus: Secondary | ICD-10-CM

## 2021-09-20 DIAGNOSIS — S72001A Fracture of unspecified part of neck of right femur, initial encounter for closed fracture: Secondary | ICD-10-CM | POA: Diagnosis not present

## 2021-09-20 MED ORDER — TRAMADOL HCL 50 MG PO TABS
50.0000 mg | ORAL_TABLET | ORAL | Status: AC
  Administered 2021-09-20: 50 mg via ORAL
  Filled 2021-09-20: qty 1

## 2021-09-20 MED ORDER — ACETAMINOPHEN 500 MG PO TABS
1000.0000 mg | ORAL_TABLET | ORAL | Status: AC
  Administered 2021-09-20: 1000 mg via ORAL
  Filled 2021-09-20: qty 2

## 2021-09-20 MED ORDER — FENTANYL CITRATE PF 50 MCG/ML IJ SOSY
50.0000 ug | PREFILLED_SYRINGE | Freq: Once | INTRAMUSCULAR | Status: AC
  Administered 2021-09-21: 50 ug via INTRAMUSCULAR
  Filled 2021-09-20: qty 1

## 2021-09-20 MED ORDER — ONDANSETRON 4 MG PO TBDP
4.0000 mg | ORAL_TABLET | Freq: Once | ORAL | Status: AC
  Administered 2021-09-20: 4 mg via ORAL
  Filled 2021-09-20: qty 1

## 2021-09-20 NOTE — ED Triage Notes (Signed)
Pt comes ems from home. Was trying to wipe herself on the toliet and lost her balance and fell onto her right arm and hip. Not on thinners. No LOC. No shortening and rotation but unable to bear weight.

## 2021-09-20 NOTE — ED Triage Notes (Signed)
Pt with no shortening or rotation on right leg, right shoulder with swelling and pt unable to move right shoulder.

## 2021-09-21 ENCOUNTER — Encounter: Payer: Self-pay | Admitting: Family Medicine

## 2021-09-21 ENCOUNTER — Inpatient Hospital Stay
Admission: EM | Admit: 2021-09-21 | Discharge: 2021-09-30 | DRG: 535 | Disposition: A | Discharge status: Rehabilitation Facility | Payer: PPO | Attending: Internal Medicine | Admitting: Internal Medicine

## 2021-09-21 ENCOUNTER — Emergency Department: Payer: PPO

## 2021-09-21 ENCOUNTER — Encounter
Admission: EM | Disposition: A | Discharge status: Rehabilitation Facility | Payer: Self-pay | Source: Home / Self Care | Attending: Hospitalist

## 2021-09-21 DIAGNOSIS — E878 Other disorders of electrolyte and fluid balance, not elsewhere classified: Secondary | ICD-10-CM | POA: Diagnosis present

## 2021-09-21 DIAGNOSIS — D631 Anemia in chronic kidney disease: Secondary | ICD-10-CM | POA: Diagnosis present

## 2021-09-21 DIAGNOSIS — F32A Depression, unspecified: Secondary | ICD-10-CM | POA: Diagnosis present

## 2021-09-21 DIAGNOSIS — G479 Sleep disorder, unspecified: Secondary | ICD-10-CM | POA: Diagnosis not present

## 2021-09-21 DIAGNOSIS — W19XXXA Unspecified fall, initial encounter: Secondary | ICD-10-CM | POA: Diagnosis not present

## 2021-09-21 DIAGNOSIS — G8918 Other acute postprocedural pain: Secondary | ICD-10-CM | POA: Diagnosis not present

## 2021-09-21 DIAGNOSIS — M25511 Pain in right shoulder: Secondary | ICD-10-CM | POA: Diagnosis not present

## 2021-09-21 DIAGNOSIS — T85631A Leakage of intraperitoneal dialysis catheter, initial encounter: Secondary | ICD-10-CM | POA: Diagnosis not present

## 2021-09-21 DIAGNOSIS — S72141A Displaced intertrochanteric fracture of right femur, initial encounter for closed fracture: Secondary | ICD-10-CM

## 2021-09-21 DIAGNOSIS — E871 Hypo-osmolality and hyponatremia: Secondary | ICD-10-CM | POA: Diagnosis present

## 2021-09-21 DIAGNOSIS — S72114A Nondisplaced fracture of greater trochanter of right femur, initial encounter for closed fracture: Secondary | ICD-10-CM | POA: Diagnosis present

## 2021-09-21 DIAGNOSIS — Y812 Prosthetic and other implants, materials and accessory general- and plastic-surgery devices associated with adverse incidents: Secondary | ICD-10-CM | POA: Diagnosis not present

## 2021-09-21 DIAGNOSIS — M8580 Other specified disorders of bone density and structure, unspecified site: Secondary | ICD-10-CM | POA: Diagnosis present

## 2021-09-21 DIAGNOSIS — S72001A Fracture of unspecified part of neck of right femur, initial encounter for closed fracture: Secondary | ICD-10-CM | POA: Diagnosis present

## 2021-09-21 DIAGNOSIS — E1142 Type 2 diabetes mellitus with diabetic polyneuropathy: Secondary | ICD-10-CM | POA: Diagnosis present

## 2021-09-21 DIAGNOSIS — J449 Chronic obstructive pulmonary disease, unspecified: Secondary | ICD-10-CM | POA: Diagnosis present

## 2021-09-21 DIAGNOSIS — R7309 Other abnormal glucose: Secondary | ICD-10-CM | POA: Diagnosis not present

## 2021-09-21 DIAGNOSIS — Z6839 Body mass index (BMI) 39.0-39.9, adult: Secondary | ICD-10-CM | POA: Diagnosis not present

## 2021-09-21 DIAGNOSIS — S72001D Fracture of unspecified part of neck of right femur, subsequent encounter for closed fracture with routine healing: Secondary | ICD-10-CM | POA: Diagnosis not present

## 2021-09-21 DIAGNOSIS — E669 Obesity, unspecified: Secondary | ICD-10-CM | POA: Diagnosis present

## 2021-09-21 DIAGNOSIS — G608 Other hereditary and idiopathic neuropathies: Secondary | ICD-10-CM | POA: Diagnosis present

## 2021-09-21 DIAGNOSIS — E876 Hypokalemia: Secondary | ICD-10-CM | POA: Diagnosis present

## 2021-09-21 DIAGNOSIS — N186 End stage renal disease: Secondary | ICD-10-CM | POA: Diagnosis present

## 2021-09-21 DIAGNOSIS — Y92013 Bedroom of single-family (private) house as the place of occurrence of the external cause: Secondary | ICD-10-CM | POA: Diagnosis not present

## 2021-09-21 DIAGNOSIS — K439 Ventral hernia without obstruction or gangrene: Secondary | ICD-10-CM

## 2021-09-21 DIAGNOSIS — D72829 Elevated white blood cell count, unspecified: Secondary | ICD-10-CM | POA: Diagnosis present

## 2021-09-21 DIAGNOSIS — F17210 Nicotine dependence, cigarettes, uncomplicated: Secondary | ICD-10-CM | POA: Diagnosis present

## 2021-09-21 DIAGNOSIS — W010XXA Fall on same level from slipping, tripping and stumbling without subsequent striking against object, initial encounter: Secondary | ICD-10-CM | POA: Diagnosis present

## 2021-09-21 DIAGNOSIS — Z20822 Contact with and (suspected) exposure to covid-19: Secondary | ICD-10-CM | POA: Diagnosis present

## 2021-09-21 DIAGNOSIS — E1122 Type 2 diabetes mellitus with diabetic chronic kidney disease: Secondary | ICD-10-CM | POA: Diagnosis present

## 2021-09-21 DIAGNOSIS — Z8249 Family history of ischemic heart disease and other diseases of the circulatory system: Secondary | ICD-10-CM | POA: Diagnosis not present

## 2021-09-21 DIAGNOSIS — R609 Edema, unspecified: Secondary | ICD-10-CM | POA: Diagnosis not present

## 2021-09-21 DIAGNOSIS — E785 Hyperlipidemia, unspecified: Secondary | ICD-10-CM | POA: Diagnosis present

## 2021-09-21 DIAGNOSIS — S42291A Other displaced fracture of upper end of right humerus, initial encounter for closed fracture: Secondary | ICD-10-CM | POA: Diagnosis not present

## 2021-09-21 DIAGNOSIS — D638 Anemia in other chronic diseases classified elsewhere: Secondary | ICD-10-CM | POA: Diagnosis not present

## 2021-09-21 DIAGNOSIS — I120 Hypertensive chronic kidney disease with stage 5 chronic kidney disease or end stage renal disease: Secondary | ICD-10-CM | POA: Diagnosis present

## 2021-09-21 DIAGNOSIS — E11649 Type 2 diabetes mellitus with hypoglycemia without coma: Secondary | ICD-10-CM | POA: Diagnosis not present

## 2021-09-21 DIAGNOSIS — Z992 Dependence on renal dialysis: Secondary | ICD-10-CM | POA: Diagnosis not present

## 2021-09-21 LAB — BASIC METABOLIC PANEL WITH GFR
Anion gap: 15 (ref 5–15)
BUN: 33 mg/dL — ABNORMAL HIGH (ref 8–23)
CO2: 25 mmol/L (ref 22–32)
Calcium: 8.9 mg/dL (ref 8.9–10.3)
Chloride: 94 mmol/L — ABNORMAL LOW (ref 98–111)
Creatinine, Ser: 7.53 mg/dL — ABNORMAL HIGH (ref 0.44–1.00)
GFR, Estimated: 5 mL/min — ABNORMAL LOW
Glucose, Bld: 125 mg/dL — ABNORMAL HIGH (ref 70–99)
Potassium: 3.5 mmol/L (ref 3.5–5.1)
Sodium: 134 mmol/L — ABNORMAL LOW (ref 135–145)

## 2021-09-21 LAB — CBC WITH DIFFERENTIAL/PLATELET
Abs Immature Granulocytes: 0.06 10*3/uL (ref 0.00–0.07)
Basophils Absolute: 0.1 10*3/uL (ref 0.0–0.1)
Basophils Relative: 1 %
Eosinophils Absolute: 0 10*3/uL (ref 0.0–0.5)
Eosinophils Relative: 0 %
HCT: 39.3 % (ref 36.0–46.0)
Hemoglobin: 13.1 g/dL (ref 12.0–15.0)
Immature Granulocytes: 1 %
Lymphocytes Relative: 16 %
Lymphs Abs: 2 10*3/uL (ref 0.7–4.0)
MCH: 30.7 pg (ref 26.0–34.0)
MCHC: 33.3 g/dL (ref 30.0–36.0)
MCV: 92 fL (ref 80.0–100.0)
Monocytes Absolute: 0.5 10*3/uL (ref 0.1–1.0)
Monocytes Relative: 4 %
Neutro Abs: 10.2 10*3/uL — ABNORMAL HIGH (ref 1.7–7.7)
Neutrophils Relative %: 78 %
Platelets: 276 10*3/uL (ref 150–400)
RBC: 4.27 MIL/uL (ref 3.87–5.11)
RDW: 14.1 % (ref 11.5–15.5)
WBC: 12.9 10*3/uL — ABNORMAL HIGH (ref 4.0–10.5)
nRBC: 0 % (ref 0.0–0.2)

## 2021-09-21 LAB — CBG MONITORING, ED
Glucose-Capillary: 110 mg/dL — ABNORMAL HIGH (ref 70–99)
Glucose-Capillary: 120 mg/dL — ABNORMAL HIGH (ref 70–99)
Glucose-Capillary: 97 mg/dL (ref 70–99)

## 2021-09-21 LAB — GLUCOSE, CAPILLARY
Glucose-Capillary: 162 mg/dL — ABNORMAL HIGH (ref 70–99)
Glucose-Capillary: 90 mg/dL (ref 70–99)
Glucose-Capillary: 241 mg/dL — ABNORMAL HIGH (ref 70–99)

## 2021-09-21 LAB — RESP PANEL BY RT-PCR (FLU A&B, COVID) ARPGX2
Influenza A by PCR: NEGATIVE
Influenza B by PCR: NEGATIVE
SARS Coronavirus 2 by RT PCR: NEGATIVE

## 2021-09-21 LAB — SURGICAL PCR SCREEN
MRSA, PCR: NEGATIVE
Staphylococcus aureus: NEGATIVE

## 2021-09-21 LAB — HEMOGLOBIN A1C
Hgb A1c MFr Bld: 9.7 % — ABNORMAL HIGH (ref 4.8–5.6)
Mean Plasma Glucose: 231.69 mg/dL

## 2021-09-21 LAB — TYPE AND SCREEN
ABO/RH(D): A POS
Antibody Screen: NEGATIVE

## 2021-09-21 LAB — PROTIME-INR
INR: 1 (ref 0.8–1.2)
Prothrombin Time: 12.9 seconds (ref 11.4–15.2)

## 2021-09-21 SURGERY — FIXATION, FRACTURE, INTERTROCHANTERIC, WITH INTRAMEDULLARY ROD
Anesthesia: Choice | Laterality: Left

## 2021-09-21 MED ORDER — ACETAMINOPHEN 325 MG PO TABS: 650.0000 mg | ORAL_TABLET | Freq: Four times a day (QID) | ORAL | Status: DC | PRN

## 2021-09-21 MED ORDER — FENTANYL CITRATE PF 50 MCG/ML IJ SOSY
25.0000 ug | PREFILLED_SYRINGE | INTRAMUSCULAR | Status: DC | PRN
  Administered 2021-09-21: 25 ug via INTRAVENOUS
  Filled 2021-09-21: qty 1

## 2021-09-21 MED ORDER — CLINDAMYCIN PHOSPHATE 600 MG/50ML IV SOLN
600.0000 mg | Freq: Three times a day (TID) | INTRAVENOUS | Status: DC
  Administered 2021-09-21: 600 mg via INTRAVENOUS
  Filled 2021-09-21 (×2): qty 50

## 2021-09-21 MED ORDER — ATORVASTATIN CALCIUM 20 MG PO TABS
20.0000 mg | ORAL_TABLET | Freq: Every day | ORAL | Status: DC
  Administered 2021-09-23 – 2021-09-29 (×7): 20 mg via ORAL
  Filled 2021-09-21 (×8): qty 1

## 2021-09-21 MED ORDER — HYDROXYZINE HCL 25 MG PO TABS
25.0000 mg | ORAL_TABLET | Freq: Two times a day (BID) | ORAL | Status: DC | PRN
  Administered 2021-09-21 – 2021-09-29 (×6): 25 mg via ORAL
  Filled 2021-09-21 (×8): qty 1

## 2021-09-21 MED ORDER — ONDANSETRON HCL 4 MG/2ML IJ SOLN
4.0000 mg | Freq: Four times a day (QID) | INTRAMUSCULAR | Status: DC | PRN
  Administered 2021-09-21 (×2): 4 mg via INTRAVENOUS
  Filled 2021-09-21 (×3): qty 2

## 2021-09-21 MED ORDER — HYDROMORPHONE HCL 1 MG/ML IJ SOLN
0.5000 mg | INTRAMUSCULAR | Status: DC | PRN
  Administered 2021-09-21 (×2): 0.5 mg via INTRAVENOUS
  Filled 2021-09-21 (×2): qty 1

## 2021-09-21 MED ORDER — ACETAMINOPHEN 325 MG RE SUPP: 650.0000 mg | Freq: Four times a day (QID) | RECTAL | Status: DC | PRN

## 2021-09-21 MED ORDER — SODIUM CHLORIDE 0.9 % IV SOLN
6.2500 mg | Freq: Four times a day (QID) | INTRAVENOUS | Status: DC | PRN
  Filled 2021-09-21: qty 0.25

## 2021-09-21 MED ORDER — ACETAMINOPHEN 500 MG PO TABS
1000.0000 mg | ORAL_TABLET | Freq: Three times a day (TID) | ORAL | Status: AC
  Administered 2021-09-21 – 2021-09-23 (×5): 1000 mg via ORAL
  Filled 2021-09-21 (×9): qty 2

## 2021-09-21 MED ORDER — TRAZODONE HCL 50 MG PO TABS
25.0000 mg | ORAL_TABLET | Freq: Every evening | ORAL | Status: DC | PRN
  Administered 2021-09-22 – 2021-09-29 (×6): 25 mg via ORAL
  Filled 2021-09-21 (×7): qty 1

## 2021-09-21 MED ORDER — PROMETHAZINE HCL 25 MG PO TABS
12.5000 mg | ORAL_TABLET | Freq: Four times a day (QID) | ORAL | Status: DC | PRN
  Filled 2021-09-21: qty 1

## 2021-09-21 MED ORDER — METOPROLOL SUCCINATE ER 25 MG PO TB24
25.0000 mg | ORAL_TABLET | Freq: Every day | ORAL | Status: DC
  Administered 2021-09-21 – 2021-09-30 (×9): 25 mg via ORAL
  Filled 2021-09-21 (×10): qty 1

## 2021-09-21 MED ORDER — SENNA 8.6 MG PO TABS: 1.0000 | ORAL_TABLET | Freq: Every evening | ORAL | Status: DC | PRN

## 2021-09-21 MED ORDER — ALPRAZOLAM 0.25 MG PO TABS
0.2500 mg | ORAL_TABLET | Freq: Once | ORAL | Status: AC | PRN
  Administered 2021-09-23: 0.25 mg via ORAL
  Filled 2021-09-21: qty 1

## 2021-09-21 MED ORDER — NITROGLYCERIN 0.4 MG SL SUBL: 0.4000 mg | SUBLINGUAL_TABLET | SUBLINGUAL | Status: DC | PRN

## 2021-09-21 MED ORDER — POLYETHYLENE GLYCOL 3350 17 G PO PACK: 17.0000 g | PACK | Freq: Every day | ORAL | Status: DC | PRN

## 2021-09-21 MED ORDER — ALPRAZOLAM 0.5 MG PO TABS
0.5000 mg | ORAL_TABLET | Freq: Every day | ORAL | Status: DC
  Administered 2021-09-21 – 2021-09-30 (×9): 0.5 mg via ORAL
  Filled 2021-09-21 (×10): qty 1

## 2021-09-21 MED ORDER — PANTOPRAZOLE SODIUM 40 MG PO TBEC
40.0000 mg | DELAYED_RELEASE_TABLET | Freq: Two times a day (BID) | ORAL | Status: DC
  Administered 2021-09-21 – 2021-09-30 (×17): 40 mg via ORAL
  Filled 2021-09-21 (×19): qty 1

## 2021-09-21 MED ORDER — MAGNESIUM HYDROXIDE 400 MG/5ML PO SUSP
30.0000 mL | Freq: Every day | ORAL | Status: DC | PRN
  Administered 2021-09-23: 30 mL via ORAL
  Filled 2021-09-21: qty 30

## 2021-09-21 MED ORDER — FENTANYL CITRATE PF 50 MCG/ML IJ SOSY
50.0000 ug | PREFILLED_SYRINGE | Freq: Once | INTRAMUSCULAR | Status: AC
  Administered 2021-09-21: 50 ug via INTRAVENOUS
  Filled 2021-09-21: qty 1

## 2021-09-21 MED ORDER — PROSOURCE PLUS PO LIQD
30.0000 mL | Freq: Every day | ORAL | Status: DC
  Administered 2021-09-23 – 2021-09-30 (×8): 30 mL via ORAL
  Filled 2021-09-21 (×10): qty 30

## 2021-09-21 MED ORDER — INSULIN ASPART 100 UNIT/ML IJ SOLN
0.0000 [IU] | Freq: Three times a day (TID) | INTRAMUSCULAR | Status: DC
Start: 2021-09-21 — End: 2021-09-25
  Administered 2021-09-21: 3 [IU] via SUBCUTANEOUS
  Administered 2021-09-21: 2 [IU] via SUBCUTANEOUS
  Administered 2021-09-22: 3 [IU] via SUBCUTANEOUS
  Administered 2021-09-22: 7 [IU] via SUBCUTANEOUS
  Administered 2021-09-22: 3 [IU] via SUBCUTANEOUS
  Administered 2021-09-23: 7 [IU] via SUBCUTANEOUS
  Administered 2021-09-23: 1 [IU] via SUBCUTANEOUS
  Administered 2021-09-23: 3 [IU] via SUBCUTANEOUS
  Administered 2021-09-23: 7 [IU] via SUBCUTANEOUS
  Administered 2021-09-24: 3 [IU] via SUBCUTANEOUS
  Administered 2021-09-24: 9 [IU] via SUBCUTANEOUS
  Administered 2021-09-24: 3 [IU] via SUBCUTANEOUS
  Administered 2021-09-24: 7 [IU] via SUBCUTANEOUS
  Administered 2021-09-25: 5 [IU] via SUBCUTANEOUS
  Administered 2021-09-25: 3 [IU] via SUBCUTANEOUS
  Filled 2021-09-21 (×14): qty 1

## 2021-09-21 MED ORDER — CHLORHEXIDINE GLUCONATE 4 % EX LIQD: 1.0000 "application " | Freq: Once | CUTANEOUS | Status: DC

## 2021-09-21 MED ORDER — HYDROMORPHONE HCL 1 MG/ML IJ SOLN: 1.0000 mg | Freq: Once | INTRAMUSCULAR | Status: DC

## 2021-09-21 MED ORDER — SODIUM CHLORIDE 0.9 % IV SOLN: INTRAVENOUS | Status: DC

## 2021-09-21 MED ORDER — MOMETASONE FURO-FORMOTEROL FUM 200-5 MCG/ACT IN AERO
2.0000 | INHALATION_SPRAY | Freq: Two times a day (BID) | RESPIRATORY_TRACT | Status: DC
  Administered 2021-09-21 – 2021-09-30 (×17): 2 via RESPIRATORY_TRACT
  Filled 2021-09-21: qty 8.8

## 2021-09-21 MED ORDER — FENTANYL CITRATE PF 50 MCG/ML IJ SOSY
25.0000 ug | PREFILLED_SYRINGE | Freq: Once | INTRAMUSCULAR | Status: AC
  Administered 2021-09-21: 25 ug via INTRAVENOUS
  Filled 2021-09-21: qty 1

## 2021-09-21 MED ORDER — NEPRO/CARBSTEADY PO LIQD
237.0000 mL | ORAL | Status: DC
  Administered 2021-09-22 – 2021-09-30 (×5): 237 mL via ORAL

## 2021-09-21 MED ORDER — ONDANSETRON HCL 4 MG/2ML IJ SOLN
4.0000 mg | Freq: Once | INTRAMUSCULAR | Status: AC
  Administered 2021-09-21: 4 mg via INTRAVENOUS
  Filled 2021-09-21: qty 2

## 2021-09-21 MED ORDER — ONDANSETRON HCL 4 MG PO TABS: 4.0000 mg | ORAL_TABLET | Freq: Four times a day (QID) | ORAL | Status: DC | PRN

## 2021-09-21 MED ORDER — FUROSEMIDE 40 MG PO TABS
80.0000 mg | ORAL_TABLET | Freq: Every day | ORAL | Status: DC
  Administered 2021-09-21 – 2021-09-30 (×10): 80 mg via ORAL
  Filled 2021-09-21 (×10): qty 2

## 2021-09-21 MED ORDER — ONDANSETRON HCL 4 MG PO TABS
4.0000 mg | ORAL_TABLET | Freq: Four times a day (QID) | ORAL | Status: DC | PRN
  Administered 2021-09-24: 4 mg via ORAL
  Filled 2021-09-21: qty 1

## 2021-09-21 MED ORDER — DELFLEX-LC/1.5% DEXTROSE 344 MOSM/L IP SOLN
INTRAPERITONEAL | Status: DC
  Filled 2021-09-21: qty 3000

## 2021-09-21 MED ORDER — VITAMIN D (ERGOCALCIFEROL) 1.25 MG (50000 UNIT) PO CAPS
50000.0000 [IU] | ORAL_CAPSULE | ORAL | Status: DC
  Administered 2021-09-28: 50000 [IU] via ORAL
  Filled 2021-09-21 (×2): qty 1

## 2021-09-21 MED ORDER — GENTAMICIN SULFATE 0.1 % EX CREA
1.0000 "application " | TOPICAL_CREAM | Freq: Every day | CUTANEOUS | Status: DC
  Administered 2021-09-21 – 2021-09-30 (×10): 1 via TOPICAL
  Filled 2021-09-21 (×3): qty 15

## 2021-09-21 MED ORDER — FENTANYL CITRATE PF 50 MCG/ML IJ SOSY
50.0000 ug | PREFILLED_SYRINGE | INTRAMUSCULAR | Status: DC | PRN
  Administered 2021-09-21: 50 ug via INTRAVENOUS
  Filled 2021-09-21: qty 1

## 2021-09-21 MED ORDER — ACETAMINOPHEN 325 MG PO TABS
650.0000 mg | ORAL_TABLET | Freq: Four times a day (QID) | ORAL | Status: DC | PRN
  Administered 2021-09-26: 650 mg via ORAL
  Filled 2021-09-21 (×2): qty 2

## 2021-09-21 MED ORDER — PROMETHAZINE HCL 25 MG RE SUPP
12.5000 mg | Freq: Four times a day (QID) | RECTAL | Status: DC | PRN
  Filled 2021-09-21: qty 1

## 2021-09-21 MED ORDER — ADULT MULTIVITAMIN LIQUID CH
Freq: Every day | ORAL | Status: DC
Start: 2021-09-21 — End: 2021-09-30
  Administered 2021-09-21 – 2021-09-28 (×7): 15 mL via ORAL
  Filled 2021-09-21 (×10): qty 15

## 2021-09-21 MED ORDER — ALBUTEROL SULFATE (2.5 MG/3ML) 0.083% IN NEBU: 3.0000 mL | INHALATION_SOLUTION | RESPIRATORY_TRACT | Status: DC | PRN

## 2021-09-21 MED ORDER — INSULIN NPH (HUMAN) (ISOPHANE) 100 UNIT/ML ~~LOC~~ SUSP
15.0000 [IU] | Freq: Two times a day (BID) | SUBCUTANEOUS | Status: DC
  Administered 2021-09-22: 15 [IU] via SUBCUTANEOUS
  Filled 2021-09-21: qty 10

## 2021-09-21 MED ORDER — CEFAZOLIN SODIUM-DEXTROSE 1-4 GM/50ML-% IV SOLN
1.0000 g | INTRAVENOUS | Status: DC
  Filled 2021-09-21: qty 50

## 2021-09-21 MED ORDER — SPIRONOLACTONE 25 MG PO TABS
25.0000 mg | ORAL_TABLET | Freq: Every day | ORAL | Status: DC
  Administered 2021-09-21 – 2021-09-30 (×10): 25 mg via ORAL
  Filled 2021-09-21 (×10): qty 1

## 2021-09-21 MED ORDER — HEPARIN 1000 UNIT/ML FOR PERITONEAL DIALYSIS
500.0000 [IU] | INTRAMUSCULAR | Status: DC | PRN
  Filled 2021-09-21: qty 0.5

## 2021-09-21 NOTE — Plan of Care (Signed)
  Problem: Education: Goal: Knowledge of General Education information will improve Description: Including pain rating scale, medication(s)/side effects and non-pharmacologic comfort measures Outcome: Progressing   Problem: Health Behavior/Discharge Planning: Goal: Ability to manage health-related needs will improve Outcome: Progressing   Problem: Clinical Measurements: Goal: Ability to maintain clinical measurements within normal limits will improve Outcome: Progressing Goal: Will remain free from infection Outcome: Progressing Goal: Diagnostic test results will improve Outcome: Progressing Goal: Respiratory complications will improve Outcome: Progressing Goal: Cardiovascular complication will be avoided Outcome: Progressing   Problem: Nutrition: Goal: Adequate nutrition will be maintained Outcome: Progressing   Problem: Coping: Goal: Level of anxiety will decrease Outcome: Progressing   Problem: Elimination: Goal: Will not experience complications related to bowel motility Outcome: Progressing Goal: Will not experience complications related to urinary retention Outcome: Progressing   Problem: Pain Managment: Goal: General experience of comfort will improve Outcome: Progressing   Problem: Safety: Goal: Ability to remain free from injury will improve Outcome: Progressing   Problem: Education: Goal: Ability to describe self-care measures that may prevent or decrease complications (Diabetes Survival Skills Education) will improve Outcome: Progressing Goal: Individualized Educational Video(s) Outcome: Progressing   Problem: Fluid Volume: Goal: Ability to maintain a balanced intake and output will improve Outcome: Progressing   Problem: Health Behavior/Discharge Planning: Goal: Ability to identify and utilize available resources and services will improve Outcome: Progressing Goal: Ability to manage health-related needs will improve Outcome: Progressing    Problem: Metabolic: Goal: Ability to maintain appropriate glucose levels will improve Outcome: Progressing   Problem: Tissue Perfusion: Goal: Adequacy of tissue perfusion will improve Outcome: Progressing   Problem: Education: Goal: Verbalization of understanding the information provided (i.e., activity precautions, restrictions, etc) will improve Outcome: Progressing Goal: Individualized Educational Video(s) Outcome: Progressing

## 2021-09-21 NOTE — H&P (Signed)
[place]   PATIENT NAME: Andrea Stewart    MR#:  [id number]  DATE OF BIRTH:  02-Jan-1946  DATE OF ADMISSION:  09/21/2021  PRIMARY CARE PHYSICIAN: Rusty Aus, MD   Patient is coming from: Home  REQUESTING/REFERRING PHYSICIAN: Ward, Delice Bison, DO  CHIEF COMPLAINT:   Chief Complaint  Patient presents with   Fall    HISTORY OF PRESENT ILLNESS:  Andrea Stewart is a 75 y.o. obese Caucasian female with medical history significant for COPD, depression, hypertension, type 2 diabetes mellitus end-stage renal disease on peritoneal dialysis and obesity, who presented to the emergency room with acute onset of mechanical fall when she was getting in her bedroom to her portable toilet seat and stumbled and fell on her right side with subsequent right shoulder and hip pain..  She denies any presyncope or syncope, chest pain or palpitations.  No paresthesias or focal muscle weakness.  She denies any recent bleeding diathesis.  No cough or wheezing or dyspnea.  No nausea or vomiting or abdominal pain.  ED Course: Upon position to the emergency room, vital signs were within normal.  Labs revealed hyponatremia 134 and borderline potassium of 3.5 and hypochloremia of 94.  BUN was 33 and creatinine 7.53 and CBC showed leukocytosis 12.9 with neutrophilia.  Influenza antigens and COVID-19 PCR came back negative. EKG as reviewed by me : Pending Imaging: Right elbow x-ray showed no fracture or subluxation.  Right humerus x-ray showed suspected fracture arising the lateral humeral head and remote healed proximal humeral shaft fracture.  Right shoulder CT showed nondisplaced fracture of the lateral humeral head with involvement of the greater tuberosity and old healed fracture of the humeral diaphysis as well as degenerative changes of the shoulder with spurring.  Noncontrast head CT scan showed chronic atrophic and ischemic changes without acute abnormality.  Right hip x-ray showed nondisplaced  right femoral intertrochanteric fracture and chronic changes.  The patient was given a gram of Tylenol, 50 mcg of IV fentanyl, 4 mg IV Zofran twice and 50 mg of p.o. tramadol.  She will be admitted to a med-surgical bed for further evaluation and management. PAST MEDICAL HISTORY:   Past Medical History:  Diagnosis Date   Backache, unspecified    s/p c-spine and l-spine fusions   COPD (chronic obstructive pulmonary disease) ([place])    Degeneration of intervertebral disc, site unspecified    Depressive disorder, not elsewhere classified    Disorder of bone and cartilage, unspecified    Family history of adverse reaction to anesthesia    mother did, but patient not sure of the complication   HTN (hypertension)    Hx of left heart catheterization by cutdown    a. 7 years ago; no significant cad; b. Lexiscan 2014: no significant ischemia, no significant EKG changes concerning for ischemia, EF 67%, overall low risk study   Kidney failure    Dr Cyndia Diver   Myalgia and myositis, unspecified    Obesity, unspecified    Personal history of tobacco use, presenting hazards to health    1/2 ppd   Plantar fascial fibromatosis    S/P cholecystectomy    S/p nephrectomy    Type II or unspecified type diabetes mellitus without mention of complication, not stated as uncontrolled    Unspecified essential hypertension    Unspecified hereditary and idiopathic peripheral neuropathy    secondary to diabetes    PAST SURGICAL HISTORY:   Past Surgical History:  Procedure Laterality Date  ABDOMINAL HYSTERECTOMY     APPENDECTOMY     BACK SURGERY     1966 and 2006    CARDIAC CATHETERIZATION     o.k. 2003   CARPAL TUNNEL RELEASE     CERVICAL DISCECTOMY     CHOLECYSTECTOMY     COLONOSCOPY  09/1999   colonoscopy-hemorrhoids, re-check 5 years (10/2006)   dexa     osteopenia (01/2004)   DIALYSIS/PERMA CATHETER INSERTION Right 01/05/2021   Procedure: DIALYSIS/PERMA CATHETER INSERTION;  Surgeon: Algernon Huxley, MD;  Location: [facility];  Service: Cardiovascular;  Laterality: Right;   DIALYSIS/PERMA CATHETER REMOVAL N/A 02/05/2021   Procedure: DIALYSIS/PERMA CATHETER REMOVAL;  Surgeon: Algernon Huxley, MD;  Location: [place];  Service: Cardiovascular;  Laterality: N/A;   ELBOW SURGERY     ESOPHAGOGASTRODUODENOSCOPY (EGD) WITH PROPOFOL N/A 07/23/2020   Procedure: ESOPHAGOGASTRODUODENOSCOPY (EGD) WITH PROPOFOL;  Surgeon: Robert Bellow, MD;  Location: [facility];  Service: Endoscopy;  Laterality: N/A;  TRAVEL TO O.R. - PATIENT TO HAVE SURGERY   FEMUR IM NAIL Left 01/01/2021   Procedure: INTRAMEDULLARY (IM) NAIL FEMORAL;  Surgeon: Lovell Sheehan, MD;  Location: [facility];  Service: Orthopedics;  Laterality: Left;   FOOT SURGERY     x 3   HIP FRACTURE SURGERY Left    JOINT REPLACEMENT     KIDNEY DONATION  1991   LAPAROSCOPIC ABDOMINAL EXPLORATION N/A 07/23/2020   Procedure: LAPAROSCOPIC ABDOMINAL EXPLORATION;  Surgeon: Robert Bellow, MD;  Location: [facility];  Service: General;  Laterality: N/A;   LUMBAR FUSION     NECK SURGERY     06/2005   Problem with general Anesthesia     02/2006   REPLACEMENT TOTAL KNEE  10/2004   trigger finger surgery     UPPER GI ENDOSCOPY N/A 07/23/2020   Procedure: UPPER GI ENDOSCOPY;  Surgeon: Robert Bellow, MD;  Location: [facility];  Service: General;  Laterality: N/A;   VENTRAL HERNIA REPAIR N/A 09/08/2017   Primary repair of a 10 x 15 mm infraumbilical fascial defect.  Surgeon: Robert Bellow, MD;  Location: [facility];  Service: General;  Laterality: N/A;   VESICOVAGINAL FISTULA CLOSURE W/ TAH     VIDEO BRONCHOSCOPY Bilateral 04/02/2015   Procedure: VIDEO BRONCHOSCOPY WITHOUT FLUORO;  Surgeon: Juanito Doom, MD;  Location: [facility];  Service: Cardiopulmonary;  Laterality: Bilateral;    SOCIAL HISTORY:   Social History   Tobacco Use   Smoking status: Every Day    Packs/day: 0.25    Years: 30.00    Pack years:  7.50    Types: Cigarettes   Smokeless tobacco: Never   Tobacco comments:    currently smoking .25 ppd, trying to quit 02/13/16  Substance Use Topics   Alcohol use: No    Alcohol/week: 0.0 standard drinks    FAMILY HISTORY:   Family History  Problem Relation Age of Onset   Hypertension Mother    Colon cancer Mother    Cancer Mother        colon   Coronary artery disease Mother    Diabetes Mother    Prostate cancer Father    COPD Father    Cancer Father        prostate   Heart attack Brother 51   Sleep apnea Brother    Diabetes Brother    Diabetes Paternal Aunt    Diabetes Paternal Uncle    Cancer Daughter  Brain, lungs, liver, spine, kidney    DRUG ALLERGIES:   Allergies  Allergen Reactions   Morphine Anaphylaxis   Prednisone Other (See Comments)    "makes me think I am having a heart attack"   Gabapentin Other (See Comments)    intolerance   Hydralazine Itching   Nsaids Diarrhea   Penicillins Swelling    TOLERATED CEFAZOLIN 07/2020 Has patient had a PCN reaction causing immediate rash, facial/tongue/throat swelling, SOB or lightheadedness with hypotension: Yes Has patient had a PCN reaction causing severe rash involving mucus membranes or skin necrosis: No Has patient had a PCN reaction that required hospitalization: No Has patient had a PCN reaction occurring within the last 10 years: No If all of the above answers are "NO", then may proceed with Cephalosporin use.     REVIEW OF SYSTEMS:   ROS As per history of present illness. All pertinent systems were reviewed above. Constitutional, HEENT, cardiovascular, respiratory, GI, GU, musculoskeletal, neuro, psychiatric, endocrine, integumentary and hematologic systems were reviewed and are otherwise negative/unremarkable except for positive findings mentioned above in the HPI.   MEDICATIONS AT HOME:   Prior to Admission medications   Medication Sig Start Date End Date Taking? Authorizing Provider   acetaminophen (TYLENOL) 325 MG tablet Take 2 tablets (650 mg total) by mouth every 6 (six) hours as needed for mild pain or headache (headache). 01/09/21  Yes Ezekiel Slocumb, DO  ALPRAZolam Duanne Moron) 0.5 MG tablet Take 0.5 mg by mouth at bedtime. 01/26/21  Yes [provider]  aspirin EC 81 MG EC tablet Take 1 tablet (81 mg total) by mouth 2 (two) times daily. Swallow whole. 01/09/21  Yes Nicole Kindred A, DO  atorvastatin (LIPITOR) 20 MG tablet TAKE 1 TABLET BY MOUTH DAILY Patient taking differently: Take 20 mg by mouth daily with supper. 08/28/19  Yes Crecencio Mc, MD  budesonide-formoterol (SYMBICORT) 160-4.5 MCG/ACT inhaler Inhale 2 puffs into the lungs daily. 11/26/14  Yes [provider]  Continuous Blood Gluc Receiver ([place]) [place] See admin instructions. 03/08/19  Yes [provider]  ergocalciferol (VITAMIN D2) 1.25 MG (50000 UT) capsule Take by mouth once a week. 04/30/21  Yes [provider]  furosemide (LASIX) 80 MG tablet Take 1 tablet (80 mg total) by mouth daily. 08/19/21  Yes Minna Merritts, MD  hydrOXYzine (ATARAX/VISTARIL) 25 MG tablet Take 1 tablet (25 mg total) by mouth 2 (two) times daily as needed for anxiety. 01/09/21  Yes Nicole Kindred A, DO  insulin NPH Human (NOVOLIN N) 100 UNIT/ML injection Novolin N NPH U-100 Insulin isophane 100 unit/mL subcutaneous susp  INJECT 20 UNITS IN THE MORNING AND 35 UNITS IN THE EVENING. MAX DAILY DOSE OF 100 UNITS 12/10/20  Yes [provider]  insulin regular human CONCENTRATED (HUMULIN R U-500 KWIKPEN) 500 UNIT/ML KwikPen Inject into the skin as needed. 05/02/20  Yes [provider]  Magnesium 500 MG TABS 1 tablet with a meal   Yes [provider]  metoprolol succinate (TOPROL-XL) 25 MG 24 hr tablet Take 1 tablet (25 mg total) by mouth daily. Take with or immediately following a meal. 08/19/21  Yes Gollan, Kathlene November, MD  Multiple Vitamins-Minerals (KP WOMENS 50+ DAILY  FORMULA) TABS Take by mouth daily. 05/02/20  Yes [provider]  nitroGLYCERIN (NITROSTAT) 0.4 MG SL tablet Place 1 tablet (0.4 mg total) under the tongue every 5 (five) minutes x 3 doses as needed for chest pain. Maximum 3 tablets 08/19/21  Yes Gollan,  Kathlene November, MD  Nutritional Supplements (,FEEDING SUPPLEMENT, PROSOURCE PLUS) liquid Take 30 mLs by mouth daily. 01/09/21  Yes Nicole Kindred A, DO  Nutritional Supplements (FEEDING SUPPLEMENT, NEPRO CARB STEADY,) LIQD Take 237 mLs by mouth daily. 01/09/21  Yes Nicole Kindred A, DO  ondansetron (ZOFRAN) 4 MG tablet Take 1 tablet (4 mg total) by mouth every 6 (six) hours as needed for nausea. 01/09/21  Yes Nicole Kindred A, DO  pantoprazole (PROTONIX) 40 MG tablet Take 40 mg by mouth 2 (two) times daily. 12/10/20  Yes [provider]  polyethylene glycol (MIRALAX / GLYCOLAX) 17 g packet Take 17 g by mouth daily. 01/09/21  Yes Nicole Kindred A, DO  Sennosides (SENNA) 8.6 MG CAPS Take 1 capsule by mouth at bedtime as needed. 09/12/20  Yes [provider]  Spacer/Aero-Holding Chambers (AEROCHAMBER MV) inhaler Use as instructed 01/20/15  Yes Juanito Doom, MD  spironolactone (ALDACTONE) 25 MG tablet Take 25 mg by mouth daily. 04/30/21  Yes [provider]  vancomycin (VANCOCIN) 125 MG capsule Take 125 mg by mouth 4 (four) times daily. Patient not taking: Reported on 09/21/2021 08/13/21   [provider]  valsartan-hydrochlorothiazide (DIOVAN-HCT) 320-25 MG per tablet TAKE ONE (1) TABLET EACH DAY 04/15/15 10/29/15  Minna Merritts, MD      VITAL SIGNS:  Blood pressure 124/90, pulse 89, temperature 98 F (36.7 C), temperature source Oral, resp. rate 17, height 4\' 11"  (1.499 m), weight 70.8 kg, SpO2 98 %.  PHYSICAL EXAMINATION:  Physical Exam  GENERAL:  75 y.o.-year-old Caucasian female patient lying in the bed with mild distress from pain.  EYES: Pupils equal, round, reactive to light and accommodation. No  scleral icterus. Extraocular muscles intact.  HEENT: Head atraumatic, normocephalic. Oropharynx and nasopharynx clear.  NECK:  Supple, no jugular venous distention. No thyroid enlargement, no tenderness.  LUNGS: Normal breath sounds bilaterally, no wheezing, rales,rhonchi or crepitation. No use of accessory muscles of respiration.  CARDIOVASCULAR: Regular rate and rhythm, S1, S2 normal. No murmurs, rubs, or gallops.  ABDOMEN: Soft, nondistended, nontender. Bowel sounds present. No organomegaly or mass.  EXTREMITIES: No pedal edema, cyanosis, or clubbing. Musculoskeletal: Right lateral hip tenderness and right arm tenderness. NEUROLOGIC: Cranial nerves II through XII are intact. Muscle strength 5/5 in all extremities. Sensation intact. Gait not checked.  PSYCHIATRIC: The patient is alert and oriented x 3.  Normal affect and good eye contact. SKIN: Scattered right upper extremity ulcers due to pruritus. LABORATORY PANEL:   CBC Recent Labs  Lab 09/21/21 0225  WBC 12.9*  HGB 13.1  HCT 39.3  PLT 276   ------------------------------------------------------------------------------------------------------------------  Chemistries  Recent Labs  Lab 09/21/21 0225  NA 134*  K 3.5  CL 94*  CO2 25  GLUCOSE 125*  BUN 33*  CREATININE 7.53*  CALCIUM 8.9   ------------------------------------------------------------------------------------------------------------------  Cardiac Enzymes No results for input(s): TROPONINI in the last 168 hours. ------------------------------------------------------------------------------------------------------------------  RADIOLOGY:  DG Shoulder Right  Result Date: 09/20/2021 CLINICAL DATA:  Recent fall with shoulder pain, initial encounter EXAM: RIGHT SHOULDER - 2+ VIEW COMPARISON:  03/26/2009 FINDINGS: There are changes consistent with prior healed right proximal humeral fracture. Degenerative changes of the acromioclavicular joint are seen. No acute  fracture or dislocation is noted. The underlying bony thorax is within normal limits. Postsurgical changes in the cervical spine are seen. IMPRESSION: Healed prior proximal right humeral fracture. No acute abnormality noted. Electronically Signed   By: Inez Catalina M.D.   On: 09/20/2021 23:30   DG  Elbow 2 Views Right  Result Date: 09/21/2021 CLINICAL DATA:  Lost balance on toilet leading to fall. Right elbow pain. EXAM: RIGHT ELBOW - 2 VIEW COMPARISON:  None. FINDINGS: There is no evidence of fracture, dislocation, or joint effusion. Mild degenerative change with spurring. Enthesopathic change about the lateral epicondyle. Soft tissues are unremarkable. Vascular calcifications are seen. IMPRESSION: No fracture or subluxation of the right elbow. Electronically Signed   By: Keith Rake M.D.   On: 09/21/2021 01:56   CT HEAD WO CONTRAST (5MM)  Result Date: 09/21/2021 CLINICAL DATA:  Recent fall with headaches, initial encounter EXAM: CT HEAD WITHOUT CONTRAST TECHNIQUE: Contiguous axial images were obtained from the base of the skull through the vertex without intravenous contrast. COMPARISON:  12/31/2015 FINDINGS: Brain: No evidence of acute infarction, hemorrhage, hydrocephalus, extra-axial collection or mass lesion/mass effect. Mild atrophic and chronic white matter ischemic changes are noted. Vascular: No hyperdense vessel or unexpected calcification. Skull: Normal. Negative for fracture or focal lesion. Sinuses/Orbits: No acute finding. Other: None. IMPRESSION: Chronic atrophic and ischemic changes without acute abnormality. Electronically Signed   By: Inez Catalina M.D.   On: 09/21/2021 00:25   CT Shoulder Right Wo Contrast  Result Date: 09/21/2021 CLINICAL DATA:  Right shoulder fracture.  Fall. EXAM: CT OF THE UPPER RIGHT EXTREMITY WITHOUT CONTRAST TECHNIQUE: Multidetector CT imaging of the upper right extremity was performed according to the standard protocol. COMPARISON:  Radiograph dated  09/21/2021. FINDINGS: Bones/Joint/Cartilage There is a nondisplaced fracture of the lateral humeral head with involvement of the greater tuberosity. Evaluation of the fracture is very limited due to advanced osteopenia. Old healed fracture of the humeral diaphysis noted. There is degenerative changes of the shoulder with spurring. No dislocation. No significant joint effusion. Probable chronic thickening of the joint capsule. Ligaments Suboptimally assessed by CT. Muscles and Tendons No acute findings.  No intramuscular hematoma. Soft tissues The soft tissues are unremarkable. IMPRESSION: 1. Nondisplaced fracture of the lateral humeral head with involvement of the greater tuberosity. Evaluation of the fracture is very limited due to advanced osteopenia. 2. Old healed fracture of the humeral diaphysis. 3. Degenerative changes of the shoulder with spurring. Electronically Signed   By: Anner Crete M.D.   On: 09/21/2021 03:17   CT Hip Right Wo Contrast  Result Date: 09/21/2021 CLINICAL DATA:  Recent fall with findings suspicious for hip fracture with negative x-rays, initial encounter EXAM: CT OF THE RIGHT HIP WITHOUT CONTRAST TECHNIQUE: Multidetector CT imaging of the right hip was performed according to the standard protocol. Multiplanar CT image reconstructions were also generated. COMPARISON:  Plain film from earlier in the same day. FINDINGS: Bones/Joint/Cartilage Postsurgical changes are noted in the lower lumbar spine. Degenerative changes of the sacroiliac joints are seen. There are findings consistent with a right intratrochanteric femoral fracture without significant displacement. No femoral neck fracture is seen. No dislocation is noted. Chronic coccygeal fracture is noted. The remainder of the bony pelvis shows no acute abnormality. Changes of prior ORIF of proximal left femoral fracture are seen. Ligaments Suboptimally assessed by CT. Muscles and Tendons Surrounding musculature appears within  normal limits. Soft tissues Peritoneal dialysis catheter is noted deep in the pelvis. Bladder is decompressed. No free fluid is seen. Visualized bowel structures are unremarkable. IMPRESSION: Undisplaced right femoral intratrochanteric fracture. Chronic changes as described above. Electronically Signed   By: Inez Catalina M.D.   On: 09/21/2021 00:32   DG Humerus Right  Result Date: 09/21/2021 CLINICAL DATA:  Lost balance leading to  fall.  Right arm pain. EXAM: RIGHT HUMERUS - 2+ VIEW COMPARISON:  Shoulder radiograph yesterday FINDINGS: Possible fracture arising from the lateral humeral head, not definitively seen on prior shoulder exam. Remote healed proximal humeral shaft fracture. No focal soft tissue abnormalities. IMPRESSION: 1. Possible fracture arising from the lateral humeral head. This was not confirmed on concurrent shoulder exam. If there is focal tenderness in this region, consider CT. 2. Remote healed proximal humeral shaft fracture. Electronically Signed   By: Keith Rake M.D.   On: 09/21/2021 01:58   DG Hip Unilat  With Pelvis 2-3 Views Right  Result Date: 09/20/2021 CLINICAL DATA:  Fall injury. EXAM: DG HIP (WITH OR WITHOUT PELVIS) 2-3V RIGHT COMPARISON:  Similar study 03/06/2014 FINDINGS: There is osteopenia without evidence of displaced fractures. There is mild joint space loss and spurring at the hips, spurring and sclerosis of SI joints and pubic symphysis and enthesopathic changes in the pelvis. There has been an interval left hip nailing which appears well-healed. Externally inserted tubing entering from the right is coiled in the pelvic floor. Again noted are changes of prior L4-5 and L5-S1 discectomy with interbody metallic cylinder grafts. The iliofemoral arteries are heavily calcified. IMPRESSION: Osteopenia, degenerative and postsurgical changes, without evidence of fractures. Progressively heavy vascular calcifications. Additional findings discussed above. Electronically Signed    By: Telford Nab M.D.   On: 09/20/2021 23:32      IMPRESSION AND PLAN:  Active Problems:   Closed right hip fracture (HCC)  1.  Closed right nondisplaced hip fracture secondary to mechanical fall. - The patient be admitted to a medical-surgical bed. - Pain management will be provided. - She will be kept NPO. - Orthopedic consult will be obtained. - I notified Dr. Sabra Heck about the patient.  2.  Right humeral fracture. - Pain management will be provided. - The patient was placed in a sling.  3.  Dyslipidemia. - We will continue statin therapy.  4.  COPD. - We will continue her inhalers.  5.  Anxiety. - Continue Xanax.  6.  Type 2 diabetes mellitus with peripheral neuropathy. - We will continue her basal coverage and placed on supplement coverage with NovoLog. - Lyrica will be continued.  7.  End-stage renal disease on peritoneal dialysis. - Nephrology consult to be obtained for follow-up.  DVT prophylaxis: SCDs.  Medical prophylaxis postponed till postoperative period. Code Status: full code. Family Communication:  The plan of care was discussed in details with the patient (and family). I answered all questions. The patient agreed to proceed with the above mentioned plan. Further management will depend upon hospital course. Disposition Plan: Back to previous home environment Consults called: Orthopedic and nephrology consults. All the records are reviewed and case discussed with ED provider.  Status is: Inpatient   Remains inpatient appropriate because:Ongoing diagnostic testing needed not appropriate for outpatient work up, Unsafe d/c plan, IV treatments appropriate due to intensity of illness or inability to take PO, and Inpatient level of care appropriate due to severity of illness   Dispo: The patient is from: Home              Anticipated d/c is to: Home              Patient currently is not medically stable to d/c.              Difficult to place patient:  No  TOTAL TIME TAKING CARE OF THIS PATIENT: 55 minutes.     Hafiz Irion  Alfredo Martinez M.D on 09/21/2021 at 5:17 AM  [facility]   From 7 PM-7 AM, contact night-coverage [email or web address]  CC: Primary care physician; Rusty Aus, MD

## 2021-09-21 NOTE — ED Notes (Signed)
Informed RN bed assigned 

## 2021-09-21 NOTE — ED Notes (Signed)
This RN attempted to place sling/immobilizer on this pt's R arm, pt stated the pain was too severe to be able to place aforementioned orthopedic device. MD Lorenda Cahill at this time.

## 2021-09-21 NOTE — ED Notes (Signed)
O2/2L via nasal cannula placed on patient. Sats maintained at 94%

## 2021-09-21 NOTE — Progress Notes (Signed)
[facility]  ROUNDING NOTE   Subjective:   Andrea Stewart is a 75 year old Caucasian female with past medical conditions including depression, COPD, hypertension, diabetes, and end-stage renal disease on peritoneal dialysis.  She presents to the emergency room after a fall while going to the bathroom and landing on her right shoulder and right hip.  She is now complaining of right shoulder and hip pain.  Patient will be admitted for Ventral hernia without obstruction or gangrene [K43.9] Closed right hip fracture ([place]) [S72.001A] Fall, initial encounter [W19.XXXA] Closed intertrochanteric fracture of hip, right, initial encounter ([place]) [S72.141A] Humeral head fracture, right, closed, initial encounter [S42.291A]  Patient is known to our office and receives peritoneal dialysis care from Dr. Juleen China.  She was recently admitted for a left hip fracture and required transition to hemodialysis for rehab placement.  She has recently been discharged from rehab and continued peritoneal dialysis at home, with help of her husband.  Last peritoneal dialysis treatment was the night of November 5.  Patient states she was attempting to get to her bedside commode and stumbled and fell on her right side.  She does endorse a great deal of pain in her right shoulder and right hip.  CT of right shoulder shows nondisplaced fracture of lateral humeral head with involvement of greater tuberosities with degenerative changes.  Right hip x-ray shows nondisplaced right femoral intertrochanteric fracture and chronic changes.  Noncontrast head CT shows chronic atrophy and ischemic changes without acute abnormalities.  Pertinent labs on arrival include sodium 134, potassium 3.5, BUN 33, creatinine 7.3, flu and COVID swabs negative.  We have been consulted to manage peritoneal dialysis needs during this admission   Objective:  Vital signs in last 24 hours:  Temp:  [98 F (36.7 C)-98.1 F (36.7 C)] 98.1 F (36.7  C) (11/07 0946) Pulse Rate:  [63-93] 91 (11/07 0946) Resp:  [17-22] 20 (11/07 0946) BP: (119-145)/(70-91) 135/70 (11/07 0946) SpO2:  [94 %-98 %] 96 % (11/07 0946) Weight:  [70.8 kg] 70.8 kg (11/06 2036)  Weight change:  Filed Weights   09/20/21 2036  Weight: 70.8 kg    Intake/Output: No intake/output data recorded.   Intake/Output this shift:  No intake/output data recorded.  Physical Exam: General: NAD, resting in bed  Head: Normocephalic, atraumatic. Moist oral mucosal membranes  Eyes: Anicteric  Lungs:  Clear to auscultation  Heart: Regular rate and rhythm  Abdomen:  Soft, nontender, nondistended  Extremities: Trace peripheral edema.  Neurologic: alert, oriented, moving all four extremities  Skin: No lesions  Access: Peritoneal dialysis catheter    Basic Metabolic Panel: Recent Labs  Lab 09/21/21 0225  NA 134*  K 3.5  CL 94*  CO2 25  GLUCOSE 125*  BUN 33*  CREATININE 7.53*  CALCIUM 8.9    Liver Function Tests: No results for input(s): AST, ALT, ALKPHOS, BILITOT, PROT, ALBUMIN in the last 168 hours. No results for input(s): LIPASE, AMYLASE in the last 168 hours. No results for input(s): AMMONIA in the last 168 hours.  CBC: Recent Labs  Lab 09/21/21 0225  WBC 12.9*  NEUTROABS 10.2*  HGB 13.1  HCT 39.3  MCV 92.0  PLT 276    Cardiac Enzymes: No results for input(s): CKTOTAL, CKMB, CKMBINDEX, TROPONINI in the last 168 hours.  BNP: Invalid input(s): POCBNP  CBG: Recent Labs  Lab 09/21/21 0028 09/21/21 0133 09/21/21 0301 09/21/21 1127  GLUCAP 110* 97 120* 162*    Microbiology: Results for orders placed or performed during the  hospital encounter of 09/21/21  Resp Panel by RT-PCR (Flu A&B, Covid) Nasopharyngeal Swab     Status: None   Collection Time: 09/21/21  2:25 AM   Specimen: Nasopharyngeal Swab; Nasopharyngeal(NP) swabs in vial transport medium  Result Value Ref Range Status   SARS Coronavirus 2 by RT PCR NEGATIVE NEGATIVE Final     Comment: (NOTE) SARS-CoV-2 target nucleic acids are NOT DETECTED.  The SARS-CoV-2 RNA is generally detectable in upper respiratory specimens during the acute phase of infection. The lowest concentration of SARS-CoV-2 viral copies this assay can detect is 138 copies/mL. A negative result does not preclude SARS-Cov-2 infection and should not be used as the sole basis for treatment or other patient management decisions. A negative result may occur with  improper specimen collection/handling, submission of specimen other than nasopharyngeal swab, presence of viral mutation(s) within the areas targeted by this assay, and inadequate number of viral copies(<138 copies/mL). A negative result must be combined with clinical observations, patient history, and epidemiological information. The expected result is Negative.  Fact Sheet for Patients:  EntrepreneurPulse.com.au  Fact Sheet for Healthcare Providers:  IncredibleEmployment.be  This test is no t yet approved or cleared by the [place] FDA and  has been authorized for detection and/or diagnosis of SARS-CoV-2 by FDA under an Emergency Use Authorization (EUA). This EUA will remain  in effect (meaning this test can be used) for the duration of the COVID-19 declaration under Section 564(b)(1) of the Act, 21 U.S.C.section 360bbb-3(b)(1), unless the authorization is terminated  or revoked sooner.       Influenza A by PCR NEGATIVE NEGATIVE Final   Influenza B by PCR NEGATIVE NEGATIVE Final    Comment: (NOTE) The Xpert Xpress SARS-CoV-2/FLU/RSV plus assay is intended as an aid in the diagnosis of influenza from Nasopharyngeal swab specimens and should not be used as a sole basis for treatment. Nasal washings and aspirates are unacceptable for Xpert Xpress SARS-CoV-2/FLU/RSV testing.  Fact Sheet for Patients: EntrepreneurPulse.com.au  Fact Sheet for Healthcare  Providers: IncredibleEmployment.be  This test is not yet approved or cleared by the [place] FDA and has been authorized for detection and/or diagnosis of SARS-CoV-2 by FDA under an Emergency Use Authorization (EUA). This EUA will remain in effect (meaning this test can be used) for the duration of the COVID-19 declaration under Section 564(b)(1) of the Act, 21 U.S.C. section 360bbb-3(b)(1), unless the authorization is terminated or revoked.  Performed at [facility], [address], [place], [place] [address]   Surgical pcr screen     Status: None   Collection Time: 09/21/21 10:15 AM   Specimen: Nasal Mucosa; Nasal Swab  Result Value Ref Range Status   MRSA, PCR NEGATIVE NEGATIVE Final   Staphylococcus aureus NEGATIVE NEGATIVE Final    Comment: (NOTE) The Xpert SA Assay (FDA approved for NASAL specimens in patients 69 years of age and older), is one component of a comprehensive surveillance program. It is not intended to diagnose infection nor to guide or monitor treatment. Performed at [facility], [address] [place], [place] [address]     Coagulation Studies: Recent Labs    09/21/21 0225  LABPROT 12.9  INR 1.0    Urinalysis: No results for input(s): COLORURINE, LABSPEC, PHURINE, GLUCOSEU, HGBUR, BILIRUBINUR, KETONESUR, PROTEINUR, UROBILINOGEN, NITRITE, LEUKOCYTESUR in the last 72 hours.  Invalid input(s): APPERANCEUR    Imaging: DG Shoulder Right  Result Date: 09/20/2021 CLINICAL DATA:  Recent fall with shoulder pain, initial encounter EXAM: RIGHT SHOULDER -  2+ VIEW COMPARISON:  03/26/2009 FINDINGS: There are changes consistent with prior healed right proximal humeral fracture. Degenerative changes of the acromioclavicular joint are seen. No acute fracture or dislocation is noted. The underlying bony thorax is within normal limits. Postsurgical changes in the cervical spine are seen. IMPRESSION: Healed prior proximal  right humeral fracture. No acute abnormality noted. Electronically Signed   By: Inez Catalina M.D.   On: 09/20/2021 23:30   DG Elbow 2 Views Right  Result Date: 09/21/2021 CLINICAL DATA:  Lost balance on toilet leading to fall. Right elbow pain. EXAM: RIGHT ELBOW - 2 VIEW COMPARISON:  None. FINDINGS: There is no evidence of fracture, dislocation, or joint effusion. Mild degenerative change with spurring. Enthesopathic change about the lateral epicondyle. Soft tissues are unremarkable. Vascular calcifications are seen. IMPRESSION: No fracture or subluxation of the right elbow. Electronically Signed   By: Keith Rake M.D.   On: 09/21/2021 01:56   CT HEAD WO CONTRAST (5MM)  Result Date: 09/21/2021 CLINICAL DATA:  Recent fall with headaches, initial encounter EXAM: CT HEAD WITHOUT CONTRAST TECHNIQUE: Contiguous axial images were obtained from the base of the skull through the vertex without intravenous contrast. COMPARISON:  12/31/2015 FINDINGS: Brain: No evidence of acute infarction, hemorrhage, hydrocephalus, extra-axial collection or mass lesion/mass effect. Mild atrophic and chronic white matter ischemic changes are noted. Vascular: No hyperdense vessel or unexpected calcification. Skull: Normal. Negative for fracture or focal lesion. Sinuses/Orbits: No acute finding. Other: None. IMPRESSION: Chronic atrophic and ischemic changes without acute abnormality. Electronically Signed   By: Inez Catalina M.D.   On: 09/21/2021 00:25   CT Shoulder Right Wo Contrast  Result Date: 09/21/2021 CLINICAL DATA:  Right shoulder fracture.  Fall. EXAM: CT OF THE UPPER RIGHT EXTREMITY WITHOUT CONTRAST TECHNIQUE: Multidetector CT imaging of the upper right extremity was performed according to the standard protocol. COMPARISON:  Radiograph dated 09/21/2021. FINDINGS: Bones/Joint/Cartilage There is a nondisplaced fracture of the lateral humeral head with involvement of the greater tuberosity. Evaluation of the fracture is  very limited due to advanced osteopenia. Old healed fracture of the humeral diaphysis noted. There is degenerative changes of the shoulder with spurring. No dislocation. No significant joint effusion. Probable chronic thickening of the joint capsule. Ligaments Suboptimally assessed by CT. Muscles and Tendons No acute findings.  No intramuscular hematoma. Soft tissues The soft tissues are unremarkable. IMPRESSION: 1. Nondisplaced fracture of the lateral humeral head with involvement of the greater tuberosity. Evaluation of the fracture is very limited due to advanced osteopenia. 2. Old healed fracture of the humeral diaphysis. 3. Degenerative changes of the shoulder with spurring. Electronically Signed   By: Anner Crete M.D.   On: 09/21/2021 03:17   CT Hip Right Wo Contrast  Result Date: 09/21/2021 CLINICAL DATA:  Recent fall with findings suspicious for hip fracture with negative x-rays, initial encounter EXAM: CT OF THE RIGHT HIP WITHOUT CONTRAST TECHNIQUE: Multidetector CT imaging of the right hip was performed according to the standard protocol. Multiplanar CT image reconstructions were also generated. COMPARISON:  Plain film from earlier in the same day. FINDINGS: Bones/Joint/Cartilage Postsurgical changes are noted in the lower lumbar spine. Degenerative changes of the sacroiliac joints are seen. There are findings consistent with a right intratrochanteric femoral fracture without significant displacement. No femoral neck fracture is seen. No dislocation is noted. Chronic coccygeal fracture is noted. The remainder of the bony pelvis shows no acute abnormality. Changes of prior ORIF of proximal left femoral fracture are seen. Ligaments Suboptimally assessed  by CT. Muscles and Tendons Surrounding musculature appears within normal limits. Soft tissues Peritoneal dialysis catheter is noted deep in the pelvis. Bladder is decompressed. No free fluid is seen. Visualized bowel structures are unremarkable.  IMPRESSION: Undisplaced right femoral intratrochanteric fracture. Chronic changes as described above. Electronically Signed   By: Inez Catalina M.D.   On: 09/21/2021 00:32   DG Humerus Right  Result Date: 09/21/2021 CLINICAL DATA:  Lost balance leading to fall.  Right arm pain. EXAM: RIGHT HUMERUS - 2+ VIEW COMPARISON:  Shoulder radiograph yesterday FINDINGS: Possible fracture arising from the lateral humeral head, not definitively seen on prior shoulder exam. Remote healed proximal humeral shaft fracture. No focal soft tissue abnormalities. IMPRESSION: 1. Possible fracture arising from the lateral humeral head. This was not confirmed on concurrent shoulder exam. If there is focal tenderness in this region, consider CT. 2. Remote healed proximal humeral shaft fracture. Electronically Signed   By: Keith Rake M.D.   On: 09/21/2021 01:58   DG Hip Unilat  With Pelvis 2-3 Views Right  Result Date: 09/20/2021 CLINICAL DATA:  Fall injury. EXAM: DG HIP (WITH OR WITHOUT PELVIS) 2-3V RIGHT COMPARISON:  Similar study 03/06/2014 FINDINGS: There is osteopenia without evidence of displaced fractures. There is mild joint space loss and spurring at the hips, spurring and sclerosis of SI joints and pubic symphysis and enthesopathic changes in the pelvis. There has been an interval left hip nailing which appears well-healed. Externally inserted tubing entering from the right is coiled in the pelvic floor. Again noted are changes of prior L4-5 and L5-S1 discectomy with interbody metallic cylinder grafts. The iliofemoral arteries are heavily calcified. IMPRESSION: Osteopenia, degenerative and postsurgical changes, without evidence of fractures. Progressively heavy vascular calcifications. Additional findings discussed above. Electronically Signed   By: Telford Nab M.D.   On: 09/20/2021 23:32     Medications:     (feeding supplement) PROSource Plus  30 mL Oral Daily   acetaminophen  1,000 mg Oral Q8H   ALPRAZolam   0.5 mg Oral QHS   atorvastatin  20 mg Oral Q supper   chlorhexidine  1 application Topical Once   feeding supplement (NEPRO CARB STEADY)  237 mL Oral Q24H   furosemide  80 mg Oral Daily   insulin aspart  0-9 Units Subcutaneous TID AC & HS   insulin NPH Human  15 Units Subcutaneous BID AC & HS   metoprolol succinate  25 mg Oral Q breakfast   mometasone-formoterol  2 puff Inhalation BID   multivitamin   Oral Daily   pantoprazole  40 mg Oral BID   spironolactone  25 mg Oral Daily   Vitamin D (Ergocalciferol)  50,000 Units Oral Weekly   acetaminophen **FOLLOWED BY** [START ON 09/24/2021] acetaminophen, albuterol, HYDROmorphone (DILAUDID) injection, hydrOXYzine, magnesium hydroxide, nitroGLYCERIN, ondansetron **OR** ondansetron (ZOFRAN) IV, ondansetron, polyethylene glycol, senna, traZODone  Assessment/ Plan:  Andrea Stewart is a 75 y.o.  female with past medical conditions including depression, COPD, hypertension, diabetes, and end-stage renal disease on peritoneal dialysis.  Patient has been admitted to [facility] for Ventral hernia without obstruction or gangrene [K43.9] Closed right hip fracture ([place]) [S72.001A] Fall, initial encounter [W19.XXXA] Closed intertrochanteric fracture of hip, right, initial encounter ([place]) [S72.141A] Humeral head fracture, right, closed, initial encounter [S42.291A]  [facility] [address]/73kg CCPD 8 hours 5 exchanges 2000 fills.    End-stage renal disease on peritoneal dialysis.  Last treatment received on November 5.  We will continue nightly therapy as prescribed outpatient.  Anemia of chronic kidney disease Lab Results  Component Value Date   HGB 13.1 09/21/2021  Iron sucrose outpatient Hemoglobin above target, no need for ESA's at this time  3. Secondary Hyperparathyroidism: with outpatient labs: PTH 3.8, phosphorus 6.0, calcium 8.3 on 08/31/21.   Lab Results  Component Value Date   CALCIUM 8.9 09/21/2021   CAION 1.13 (L) 07/23/2020   PHOS  4.1 01/06/2021  Calcium and phosphorus at target at this time.  4. Diabetes mellitus type II with chronic kidney disease: insulin dependent. Home regimen includes NPH and Humulin. Most recent hemoglobin A1c is 9.7 on 09/21/21.  Glucose stable at this time   LOS: 0 Morenike Cuff 11/7/20222:09 PM

## 2021-09-21 NOTE — Progress Notes (Signed)
PROGRESS NOTE    Andrea Stewart  KCL:[id number] DOB: 01-27-1946 DOA: 09/21/2021 PCP: Rusty Aus, MD   Chief Complaint  Patient presents with   Fall    Brief Narrative:  Andrea Stewart is Andrea Stewart 75 y.o. obese Caucasian female with medical history significant for COPD, depression, hypertension, type 2 diabetes mellitus end-stage renal disease on peritoneal dialysis and obesity, who presented to the emergency room with acute onset of mechanical fall when she was getting in her bedroom to her portable toilet seat and stumbled and fell on her right side with subsequent right shoulder and hip pain.  Imaging showed Andrea Stewart nondisplaced fracture of the lateral humeral head and an undisplaced right femoral intratrochanteric fracture.   Assessment & Plan:   Active Problems:   Closed right hip fracture [facility])  Mechanical Fall Right nondisplaced femoral intratrochanteric fracture Right Nondisplaced Fracture of Lateral Humeral Head with involvement of the Greater Tuberosity Ortho c/s, appreciate recs, Dr. Sabra Heck to see and decide on surgery RCRI at least 2, discussed inherent risk with pt/son NPO Place sling when able (currently pain limiting) Pain control, scheduled APAP, fentanyl (anaphylaxis with morphine - per discussion with pharmacy, she's had fentanyl, dilaudid, tramadal, norco, oxycodone in past and tolerated) Head CT without acute abnormalities, elbow plain films without abnormality  ESRD on peritoneal dialysis Renal c/s, appreciate assistance Lasix, spiron  Hypertension Lasix, spironolactone, metop  T2DM c/b peripheral neuropathy Continue basal (lower dose with NPO status) and SSI Lyrica  Dyslipidemia Statin  COPD  inhalers  Anxiety Xanax  DVT prophylaxis: SCD Code Status: full Family Communication: son at bedside Disposition:   Status is: Inpatient  Remains inpatient appropriate because: need for ortho eval and possible intervention       Consultants:   Ortho neprho  Procedures:  none  Antimicrobials:  Anti-infectives (From admission, onward)    Start     Dose/Rate Route Frequency Ordered Stop   09/21/21 0600  ceFAZolin (ANCEF) IVPB 1 g/50 mL premix        1 g 100 mL/hr over 30 Minutes Intravenous On call to O.R. 09/21/21 0221 09/22/21 0559   09/21/21 0600  clindamycin (CLEOCIN) IVPB 600 mg        600 mg 100 mL/hr over 30 Minutes Intravenous Every 8 hours 09/21/21 0221 09/22/21 0559          Subjective: C/o pain, most notably, to R shoulder No LOC  Objective: Vitals:   09/21/21 0500 09/21/21 0706 09/21/21 0745 09/21/21 0800  BP: 124/90 136/88 137/79 (!) 145/74  Pulse: 89 88 93 85  Resp: 17 (!) 21  19  Temp:      TempSrc:      SpO2: 98% 98% 98% 96%  Weight:      Height:       No intake or output data in the 24 hours ending 09/21/21 0901 Filed Weights   09/20/21 2036  Weight: 70.8 kg    Examination:  General exam: Appears calm and comfortable  NCAT, C spine non tender to palpation Respiratory system: unlabored Cardiovascular system: RRR Gastrointestinal system: Abdomen is nondistended, soft and nontender.  PD catheter Central nervous system: Alert and oriented. No focal neurological deficits. Extremities: R leg shortened, externally rotated, R arm still in bed due to pain - L extermities without discomfort on exam Skin: excoriations on R arm (due to itching she notes) Psychiatry: Judgement and insight appear normal. Mood & affect appropriate.     Data Reviewed: I have personally reviewed following  labs and imaging studies  CBC: Recent Labs  Lab 09/21/21 0225  WBC 12.9*  NEUTROABS 10.2*  HGB 13.1  HCT 39.3  MCV 92.0  PLT 696    Basic Metabolic Panel: Recent Labs  Lab 09/21/21 0225  NA 134*  K 3.5  CL 94*  CO2 25  GLUCOSE 125*  BUN 33*  CREATININE 7.53*  CALCIUM 8.9    GFR: Estimated Creatinine Clearance: 5.4 mL/min (Andrea Stewart) (by C-G formula based on SCr of 7.53 mg/dL (H)).  Liver  Function Tests: No results for input(s): AST, ALT, ALKPHOS, BILITOT, PROT, ALBUMIN in the last 168 hours.  CBG: Recent Labs  Lab 09/21/21 0028 09/21/21 0133 09/21/21 0301  GLUCAP 110* 97 120*     Recent Results (from the past 240 hour(s))  Resp Panel by RT-PCR (Flu Andrea Stewart, Covid) Nasopharyngeal Swab     Status: None   Collection Time: 09/21/21  2:25 AM   Specimen: Nasopharyngeal Swab; Nasopharyngeal(NP) swabs in vial transport medium  Result Value Ref Range Status   SARS Coronavirus 2 by RT PCR NEGATIVE NEGATIVE Final    Comment: (NOTE) SARS-CoV-2 target nucleic acids are NOT DETECTED.  The SARS-CoV-2 RNA is generally detectable in upper respiratory specimens during the acute phase of infection. The lowest concentration of SARS-CoV-2 viral copies this assay can detect is 138 copies/mL. Andrea Stewart negative result does not preclude SARS-Cov-2 infection and should not be used as the sole basis for treatment or other patient management decisions. Andrea Stewart negative result may occur with  improper specimen collection/handling, submission of specimen other than nasopharyngeal swab, presence of viral mutation(s) within the areas targeted by this assay, and inadequate number of viral copies(<138 copies/mL). Andrea Stewart negative result must be combined with clinical observations, patient history, and epidemiological information. The expected result is Negative.  Fact Sheet for Patients:  EntrepreneurPulse.com.au  Fact Sheet for Healthcare Providers:  IncredibleEmployment.be  This test is no t yet approved or cleared by the [place] FDA and  has been authorized for detection and/or diagnosis of SARS-CoV-2 by FDA under an Emergency Use Authorization (EUA). This EUA will remain  in effect (meaning this test can be used) for the duration of the COVID-19 declaration under Section 564(b)(1) of the Act, 21 U.S.C.section 360bbb-3(b)(1), unless the authorization is terminated   or revoked sooner.       Influenza Andrea Stewart by PCR NEGATIVE NEGATIVE Final   Influenza B by PCR NEGATIVE NEGATIVE Final    Comment: (NOTE) The Xpert Xpress SARS-CoV-2/FLU/RSV plus assay is intended as an aid in the diagnosis of influenza from Nasopharyngeal swab specimens and should not be used as Idabelle Mcpeters sole basis for treatment. Nasal washings and aspirates are unacceptable for Xpert Xpress SARS-CoV-2/FLU/RSV testing.  Fact Sheet for Patients: EntrepreneurPulse.com.au  Fact Sheet for Healthcare Providers: IncredibleEmployment.be  This test is not yet approved or cleared by the [place] FDA and has been authorized for detection and/or diagnosis of SARS-CoV-2 by FDA under an Emergency Use Authorization (EUA). This EUA will remain in effect (meaning this test can be used) for the duration of the COVID-19 declaration under Section 564(b)(1) of the Act, 21 U.S.C. section 360bbb-3(b)(1), unless the authorization is terminated or revoked.  Performed at [facility], [address], [place], [place] [address]          Radiology Studies: DG Shoulder Right  Result Date: 09/20/2021 CLINICAL DATA:  Recent fall with shoulder pain, initial encounter EXAM: RIGHT SHOULDER - 2+ VIEW COMPARISON:  03/26/2009 FINDINGS: There are changes consistent  with prior healed right proximal humeral fracture. Degenerative changes of the acromioclavicular joint are seen. No acute fracture or dislocation is noted. The underlying bony thorax is within normal limits. Postsurgical changes in the cervical spine are seen. IMPRESSION: Healed prior proximal right humeral fracture. No acute abnormality noted. Electronically Signed   By: Inez Catalina M.D.   On: 09/20/2021 23:30   DG Elbow 2 Views Right  Result Date: 09/21/2021 CLINICAL DATA:  Lost balance on toilet leading to fall. Right elbow pain. EXAM: RIGHT ELBOW - 2 VIEW COMPARISON:  None. FINDINGS: There is no evidence  of fracture, dislocation, or joint effusion. Mild degenerative change with spurring. Enthesopathic change about the lateral epicondyle. Soft tissues are unremarkable. Vascular calcifications are seen. IMPRESSION: No fracture or subluxation of the right elbow. Electronically Signed   By: Keith Rake M.D.   On: 09/21/2021 01:56   CT HEAD WO CONTRAST (5MM)  Result Date: 09/21/2021 CLINICAL DATA:  Recent fall with headaches, initial encounter EXAM: CT HEAD WITHOUT CONTRAST TECHNIQUE: Contiguous axial images were obtained from the base of the skull through the vertex without intravenous contrast. COMPARISON:  12/31/2015 FINDINGS: Brain: No evidence of acute infarction, hemorrhage, hydrocephalus, extra-axial collection or mass lesion/mass effect. Mild atrophic and chronic white matter ischemic changes are noted. Vascular: No hyperdense vessel or unexpected calcification. Skull: Normal. Negative for fracture or focal lesion. Sinuses/Orbits: No acute finding. Other: None. IMPRESSION: Chronic atrophic and ischemic changes without acute abnormality. Electronically Signed   By: Inez Catalina M.D.   On: 09/21/2021 00:25   CT Shoulder Right Wo Contrast  Result Date: 09/21/2021 CLINICAL DATA:  Right shoulder fracture.  Fall. EXAM: CT OF THE UPPER RIGHT EXTREMITY WITHOUT CONTRAST TECHNIQUE: Multidetector CT imaging of the upper right extremity was performed according to the standard protocol. COMPARISON:  Radiograph dated 09/21/2021. FINDINGS: Bones/Joint/Cartilage There is Laikyn Gewirtz nondisplaced fracture of the lateral humeral head with involvement of the greater tuberosity. Evaluation of the fracture is very limited due to advanced osteopenia. Old healed fracture of the humeral diaphysis noted. There is degenerative changes of the shoulder with spurring. No dislocation. No significant joint effusion. Probable chronic thickening of the joint capsule. Ligaments Suboptimally assessed by CT. Muscles and Tendons No acute  findings.  No intramuscular hematoma. Soft tissues The soft tissues are unremarkable. IMPRESSION: 1. Nondisplaced fracture of the lateral humeral head with involvement of the greater tuberosity. Evaluation of the fracture is very limited due to advanced osteopenia. 2. Old healed fracture of the humeral diaphysis. 3. Degenerative changes of the shoulder with spurring. Electronically Signed   By: Anner Crete M.D.   On: 09/21/2021 03:17   CT Hip Right Wo Contrast  Result Date: 09/21/2021 CLINICAL DATA:  Recent fall with findings suspicious for hip fracture with negative x-rays, initial encounter EXAM: CT OF THE RIGHT HIP WITHOUT CONTRAST TECHNIQUE: Multidetector CT imaging of the right hip was performed according to the standard protocol. Multiplanar CT image reconstructions were also generated. COMPARISON:  Plain film from earlier in the same day. FINDINGS: Bones/Joint/Cartilage Postsurgical changes are noted in the lower lumbar spine. Degenerative changes of the sacroiliac joints are seen. There are findings consistent with Gaetano Romberger right intratrochanteric femoral fracture without significant displacement. No femoral neck fracture is seen. No dislocation is noted. Chronic coccygeal fracture is noted. The remainder of the bony pelvis shows no acute abnormality. Changes of prior ORIF of proximal left femoral fracture are seen. Ligaments Suboptimally assessed by CT. Muscles and Tendons Surrounding musculature appears within normal  limits. Soft tissues Peritoneal dialysis catheter is noted deep in the pelvis. Bladder is decompressed. No free fluid is seen. Visualized bowel structures are unremarkable. IMPRESSION: Undisplaced right femoral intratrochanteric fracture. Chronic changes as described above. Electronically Signed   By: Inez Catalina M.D.   On: 09/21/2021 00:32   DG Humerus Right  Result Date: 09/21/2021 CLINICAL DATA:  Lost balance leading to fall.  Right arm pain. EXAM: RIGHT HUMERUS - 2+ VIEW COMPARISON:   Shoulder radiograph yesterday FINDINGS: Possible fracture arising from the lateral humeral head, not definitively seen on prior shoulder exam. Remote healed proximal humeral shaft fracture. No focal soft tissue abnormalities. IMPRESSION: 1. Possible fracture arising from the lateral humeral head. This was not confirmed on concurrent shoulder exam. If there is focal tenderness in this region, consider CT. 2. Remote healed proximal humeral shaft fracture. Electronically Signed   By: Keith Rake M.D.   On: 09/21/2021 01:58   DG Hip Unilat  With Pelvis 2-3 Views Right  Result Date: 09/20/2021 CLINICAL DATA:  Fall injury. EXAM: DG HIP (WITH OR WITHOUT PELVIS) 2-3V RIGHT COMPARISON:  Similar study 03/06/2014 FINDINGS: There is osteopenia without evidence of displaced fractures. There is mild joint space loss and spurring at the hips, spurring and sclerosis of SI joints and pubic symphysis and enthesopathic changes in the pelvis. There has been an interval left hip nailing which appears well-healed. Externally inserted tubing entering from the right is coiled in the pelvic floor. Again noted are changes of prior L4-5 and L5-S1 discectomy with interbody metallic cylinder grafts. The iliofemoral arteries are heavily calcified. IMPRESSION: Osteopenia, degenerative and postsurgical changes, without evidence of fractures. Progressively heavy vascular calcifications. Additional findings discussed above. Electronically Signed   By: Telford Nab M.D.   On: 09/20/2021 23:32        Scheduled Meds:  (feeding supplement) PROSource Plus  30 mL Oral Daily   acetaminophen  1,000 mg Oral Q8H   ALPRAZolam  0.5 mg Oral QHS   atorvastatin  20 mg Oral Q supper   chlorhexidine  1 application Topical Once   feeding supplement (NEPRO CARB STEADY)  237 mL Oral Q24H   fentaNYL (SUBLIMAZE) injection  25 mcg Intravenous Once   furosemide  80 mg Oral Daily   insulin aspart  0-9 Units Subcutaneous TID AC & HS   insulin NPH  Human  15 Units Subcutaneous BID AC & HS   metoprolol succinate  25 mg Oral Q breakfast   mometasone-formoterol  2 puff Inhalation BID   multivitamin   Oral Daily   pantoprazole  40 mg Oral BID   spironolactone  25 mg Oral Daily   Vitamin D (Ergocalciferol)  50,000 Units Oral Weekly   Continuous Infusions:   ceFAZolin (ANCEF) IV Stopped (09/21/21 0554)   clindamycin (CLEOCIN) IV       LOS: 0 days    Time spent: over 30 min    Fayrene Helper, MD [facility]   To contact the attending provider between 7A-7P or the covering provider during after hours 7P-7A, please log into the web site [email or web address] and access using universal [place] password for that web site. If you do not have the password, please call the hospital operator.  09/21/2021, 9:01 AM

## 2021-09-21 NOTE — Consult Note (Signed)
ORTHOPAEDIC CONSULTATION  REQUESTING PHYSICIAN: Mansy, Arvella Merles, MD  Chief Complaint: Right hip and shoulder pain  HPI: Andrea Stewart is a 75 y.o. female who complains of right hip and shoulder pain following a fall yesterday at home.  The patient was brought to the emergency room where exam, x-rays, and CT scan show a nondisplaced fracture of the greater trochanter of the right hip.  This is nonoperative.  In addition she has x-ray evidence of an old humeral fracture approximately without any specific new fracture noted.  She complains of pain in these areas.  Patient is under going chronic peritoneal dialysis.  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  He has multiple medical comorbidities.  She has been admitted for pain control and PT.  Her husband apparently fell this morning and is being taken to [facility] for brain bleeding.  The patient is understandably upset about this.  Past Medical History:  Diagnosis Date   Backache, unspecified    s/p c-spine and l-spine fusions   COPD (chronic obstructive pulmonary disease) ([place])    Degeneration of intervertebral disc, site unspecified    Depressive disorder, not elsewhere classified    Disorder of bone and cartilage, unspecified    Family history of adverse reaction to anesthesia    mother did, but patient not sure of the complication   HTN (hypertension)    Hx of left heart catheterization by cutdown    a. 7 years ago; no significant cad; b. Lexiscan 2014: no significant ischemia, no significant EKG changes concerning for ischemia, EF 67%, overall low risk study   Kidney failure    Dr Cyndia Diver   Myalgia and myositis, unspecified    Obesity, unspecified    Personal history of tobacco use, presenting hazards to health    1/2 ppd   Plantar fascial fibromatosis    S/P cholecystectomy    S/p nephrectomy    Type II or unspecified type diabetes mellitus without mention of complication, not stated as uncontrolled    Unspecified essential  hypertension    Unspecified hereditary and idiopathic peripheral neuropathy    secondary to diabetes   Past Surgical History:  Procedure Laterality Date   ABDOMINAL HYSTERECTOMY     APPENDECTOMY     BACK SURGERY     1966 and 2006    CARDIAC CATHETERIZATION     o.k. 2003   CARPAL TUNNEL RELEASE     CERVICAL DISCECTOMY     CHOLECYSTECTOMY     COLONOSCOPY  09/1999   colonoscopy-hemorrhoids, re-check 5 years (10/2006)   dexa     osteopenia (01/2004)   DIALYSIS/PERMA CATHETER INSERTION Right 01/05/2021   Procedure: DIALYSIS/PERMA CATHETER INSERTION;  Surgeon: Algernon Huxley, MD;  Location: [place];  Service: Cardiovascular;  Laterality: Right;   DIALYSIS/PERMA CATHETER REMOVAL N/A 02/05/2021   Procedure: DIALYSIS/PERMA CATHETER REMOVAL;  Surgeon: Algernon Huxley, MD;  Location: [place] [facility];  Service: Cardiovascular;  Laterality: N/A;   ELBOW SURGERY     ESOPHAGOGASTRODUODENOSCOPY (EGD) WITH PROPOFOL N/A 07/23/2020   Procedure: ESOPHAGOGASTRODUODENOSCOPY (EGD) WITH PROPOFOL;  Surgeon: Robert Bellow, MD;  Location: [facility];  Service: Endoscopy;  Laterality: N/A;  TRAVEL TO O.R. - PATIENT TO HAVE SURGERY   FEMUR IM NAIL Left 01/01/2021   Procedure: INTRAMEDULLARY (IM) NAIL FEMORAL;  Surgeon: Lovell Sheehan, MD;  Location: [facility];  Service: Orthopedics;  Laterality: Left;   FOOT SURGERY     x 3   HIP FRACTURE SURGERY Left  JOINT REPLACEMENT     KIDNEY DONATION  1991   LAPAROSCOPIC ABDOMINAL EXPLORATION N/A 07/23/2020   Procedure: LAPAROSCOPIC ABDOMINAL EXPLORATION;  Surgeon: Robert Bellow, MD;  Location: [facility];  Service: General;  Laterality: N/A;   LUMBAR FUSION     NECK SURGERY     06/2005   Problem with general Anesthesia     02/2006   REPLACEMENT TOTAL KNEE  10/2004   trigger finger surgery     UPPER GI ENDOSCOPY N/A 07/23/2020   Procedure: UPPER GI ENDOSCOPY;  Surgeon: Robert Bellow, MD;  Location: [facility] OR[facility];  Service: General;   Laterality: N/A;   VENTRAL HERNIA REPAIR N/A 09/08/2017   Primary repair of a 10 x 15 mm infraumbilical fascial defect.  Surgeon: Robert Bellow, MD;  Location: [facility];  Service: General;  Laterality: N/A;   VESICOVAGINAL FISTULA CLOSURE W/ TAH     VIDEO BRONCHOSCOPY Bilateral 04/02/2015   Procedure: VIDEO BRONCHOSCOPY WITHOUT FLUORO;  Surgeon: Juanito Doom, MD;  Location:  [facility];  Service: Cardiopulmonary;  Laterality: Bilateral;   Social History   Socioeconomic History   Marital status: Married    Spouse name: Not on file   Number of children: 4   Years of education: Not on file   Highest education level: Not on file  Occupational History   Occupation: Retired  Tobacco Use   Smoking status: Every Day    Packs/day: 0.25    Years: 30.00    Pack years: 7.50    Types: Cigarettes   Smokeless tobacco: Never   Tobacco comments:    currently smoking .25 ppd, trying to quit 02/13/16  Substance and Sexual Activity   Alcohol use: No    Alcohol/week: 0.0 standard drinks   Drug use: No   Sexual activity: Never  Other Topics Concern   Not on file  Social History Narrative   Lives in [place]. Disability because of back   Social Determinants of Health   Financial Resource Strain: Not on file  Food Insecurity: Not on file  Transportation Needs: Not on file  Physical Activity: Not on file  Stress: Not on file  Social Connections: Not on file   Family History  Problem Relation Age of Onset   Hypertension Mother    Colon cancer Mother    Cancer Mother        colon   Coronary artery disease Mother    Diabetes Mother    Prostate cancer Father    COPD Father    Cancer Father        prostate   Heart attack Brother 20   Sleep apnea Brother    Diabetes Brother    Diabetes Paternal Aunt    Diabetes Paternal Uncle    Cancer Daughter        Brain, lungs, liver, spine, kidney   Allergies  Allergen Reactions   Morphine Anaphylaxis   Prednisone Other (See  Comments)    "makes me think I am having a heart attack"   Gabapentin Other (See Comments)    intolerance   Hydralazine Itching   Nsaids Diarrhea   Penicillins Swelling    TOLERATED CEFAZOLIN 07/2020 Has patient had a PCN reaction causing immediate rash, facial/tongue/throat swelling, SOB or lightheadedness with hypotension: Yes Has patient had a PCN reaction causing severe rash involving mucus membranes or skin necrosis: No Has patient had a PCN reaction that required hospitalization: No Has patient had a PCN reaction occurring within the last 10 years:  No If all of the above answers are "NO", then may proceed with Cephalosporin use.    Prior to Admission medications   Medication Sig Start Date End Date Taking? Authorizing Provider  acetaminophen (TYLENOL) 325 MG tablet Take 2 tablets (650 mg total) by mouth every 6 (six) hours as needed for mild pain or headache (headache). 01/09/21  Yes Ezekiel Slocumb, DO  ALPRAZolam Duanne Moron) 0.5 MG tablet Take 0.5 mg by mouth at bedtime. 01/26/21  Yes [provider]  aspirin EC 81 MG EC tablet Take 1 tablet (81 mg total) by mouth 2 (two) times daily. Swallow whole. 01/09/21  Yes Nicole Kindred A, DO  atorvastatin (LIPITOR) 20 MG tablet TAKE 1 TABLET BY MOUTH DAILY Patient taking differently: Take 20 mg by mouth daily with supper. 08/28/19  Yes Crecencio Mc, MD  budesonide-formoterol (SYMBICORT) 160-4.5 MCG/ACT inhaler Inhale 2 puffs into the lungs daily. 11/26/14  Yes [provider]  Continuous Blood Gluc Receiver ([place]) Wise See admin instructions. 03/08/19  Yes [provider]  ergocalciferol (VITAMIN D2) 1.25 MG (50000 UT) capsule Take by mouth once a week. 04/30/21  Yes [provider]  furosemide (LASIX) 80 MG tablet Take 1 tablet (80 mg total) by mouth daily. 08/19/21  Yes Minna Merritts, MD  hydrOXYzine (ATARAX/VISTARIL) 25 MG tablet Take 1 tablet (25 mg total) by mouth 2 (two) times daily as  needed for anxiety. 01/09/21  Yes Nicole Kindred A, DO  insulin NPH Human (NOVOLIN N) 100 UNIT/ML injection Novolin N NPH U-100 Insulin isophane 100 unit/mL subcutaneous susp  INJECT 20 UNITS IN THE MORNING AND 35 UNITS IN THE EVENING. MAX DAILY DOSE OF 100 UNITS 12/10/20  Yes [provider]  insulin regular human CONCENTRATED (HUMULIN R U-500 KWIKPEN) 500 UNIT/ML KwikPen Inject into the skin as needed. 05/02/20  Yes [provider]  Magnesium 500 MG TABS 1 tablet with a meal   Yes [provider]  metoprolol succinate (TOPROL-XL) 25 MG 24 hr tablet Take 1 tablet (25 mg total) by mouth daily. Take with or immediately following a meal. 08/19/21  Yes Gollan, Kathlene November, MD  Multiple Vitamins-Minerals (KP WOMENS 50+ DAILY FORMULA) TABS Take by mouth daily. 05/02/20  Yes [provider]  nitroGLYCERIN (NITROSTAT) 0.4 MG SL tablet Place 1 tablet (0.4 mg total) under the tongue every 5 (five) minutes x 3 doses as needed for chest pain. Maximum 3 tablets 08/19/21  Yes Gollan, Kathlene November, MD  Nutritional Supplements (,FEEDING SUPPLEMENT, PROSOURCE PLUS) liquid Take 30 mLs by mouth daily. 01/09/21  Yes Nicole Kindred A, DO  Nutritional Supplements (FEEDING SUPPLEMENT, NEPRO CARB STEADY,) LIQD Take 237 mLs by mouth daily. 01/09/21  Yes Nicole Kindred A, DO  ondansetron (ZOFRAN) 4 MG tablet Take 1 tablet (4 mg total) by mouth every 6 (six) hours as needed for nausea. 01/09/21  Yes Nicole Kindred A, DO  pantoprazole (PROTONIX) 40 MG tablet Take 40 mg by mouth 2 (two) times daily. 12/10/20  Yes [provider]  polyethylene glycol (MIRALAX / GLYCOLAX) 17 g packet Take 17 g by mouth daily. 01/09/21  Yes Nicole Kindred A, DO  Sennosides (SENNA) 8.6 MG CAPS Take 1 capsule by mouth at bedtime as needed. 09/12/20  Yes [provider]  Spacer/Aero-Holding Chambers (AEROCHAMBER MV) inhaler Use as instructed 01/20/15  Yes Juanito Doom, MD  spironolactone (ALDACTONE) 25 MG  tablet Take 25 mg by mouth daily. 04/30/21  Yes [provider]  vancomycin Zollie Pee)  125 MG capsule Take 125 mg by mouth 4 (four) times daily. Patient not taking: Reported on 09/21/2021 08/13/21   [provider]  valsartan-hydrochlorothiazide (DIOVAN-HCT) 320-25 MG per tablet TAKE ONE (1) TABLET EACH DAY 04/15/15 10/29/15  Minna Merritts, MD   DG Shoulder Right  Result Date: 09/20/2021 CLINICAL DATA:  Recent fall with shoulder pain, initial encounter EXAM: RIGHT SHOULDER - 2+ VIEW COMPARISON:  03/26/2009 FINDINGS: There are changes consistent with prior healed right proximal humeral fracture. Degenerative changes of the acromioclavicular joint are seen. No acute fracture or dislocation is noted. The underlying bony thorax is within normal limits. Postsurgical changes in the cervical spine are seen. IMPRESSION: Healed prior proximal right humeral fracture. No acute abnormality noted. Electronically Signed   By: Inez Catalina M.D.   On: 09/20/2021 23:30   DG Elbow 2 Views Right  Result Date: 09/21/2021 CLINICAL DATA:  Lost balance on toilet leading to fall. Right elbow pain. EXAM: RIGHT ELBOW - 2 VIEW COMPARISON:  None. FINDINGS: There is no evidence of fracture, dislocation, or joint effusion. Mild degenerative change with spurring. Enthesopathic change about the lateral epicondyle. Soft tissues are unremarkable. Vascular calcifications are seen. IMPRESSION: No fracture or subluxation of the right elbow. Electronically Signed   By: Keith Rake M.D.   On: 09/21/2021 01:56   CT HEAD WO CONTRAST (5MM)  Result Date: 09/21/2021 CLINICAL DATA:  Recent fall with headaches, initial encounter EXAM: CT HEAD WITHOUT CONTRAST TECHNIQUE: Contiguous axial images were obtained from the base of the skull through the vertex without intravenous contrast. COMPARISON:  12/31/2015 FINDINGS: Brain: No evidence of acute infarction, hemorrhage, hydrocephalus, extra-axial collection or mass lesion/mass  effect. Mild atrophic and chronic white matter ischemic changes are noted. Vascular: No hyperdense vessel or unexpected calcification. Skull: Normal. Negative for fracture or focal lesion. Sinuses/Orbits: No acute finding. Other: None. IMPRESSION: Chronic atrophic and ischemic changes without acute abnormality. Electronically Signed   By: Inez Catalina M.D.   On: 09/21/2021 00:25   CT Shoulder Right Wo Contrast  Result Date: 09/21/2021 CLINICAL DATA:  Right shoulder fracture.  Fall. EXAM: CT OF THE UPPER RIGHT EXTREMITY WITHOUT CONTRAST TECHNIQUE: Multidetector CT imaging of the upper right extremity was performed according to the standard protocol. COMPARISON:  Radiograph dated 09/21/2021. FINDINGS: Bones/Joint/Cartilage There is a nondisplaced fracture of the lateral humeral head with involvement of the greater tuberosity. Evaluation of the fracture is very limited due to advanced osteopenia. Old healed fracture of the humeral diaphysis noted. There is degenerative changes of the shoulder with spurring. No dislocation. No significant joint effusion. Probable chronic thickening of the joint capsule. Ligaments Suboptimally assessed by CT. Muscles and Tendons No acute findings.  No intramuscular hematoma. Soft tissues The soft tissues are unremarkable. IMPRESSION: 1. Nondisplaced fracture of the lateral humeral head with involvement of the greater tuberosity. Evaluation of the fracture is very limited due to advanced osteopenia. 2. Old healed fracture of the humeral diaphysis. 3. Degenerative changes of the shoulder with spurring. Electronically Signed   By: Anner Crete M.D.   On: 09/21/2021 03:17   CT Hip Right Wo Contrast  Result Date: 09/21/2021 CLINICAL DATA:  Recent fall with findings suspicious for hip fracture with negative x-rays, initial encounter EXAM: CT OF THE RIGHT HIP WITHOUT CONTRAST TECHNIQUE: Multidetector CT imaging of the right hip was performed according to the standard protocol.  Multiplanar CT image reconstructions were also generated. COMPARISON:  Plain film from earlier in the same day. FINDINGS: Bones/Joint/Cartilage Postsurgical changes  are noted in the lower lumbar spine. Degenerative changes of the sacroiliac joints are seen. There are findings consistent with a right intratrochanteric femoral fracture without significant displacement. No femoral neck fracture is seen. No dislocation is noted. Chronic coccygeal fracture is noted. The remainder of the bony pelvis shows no acute abnormality. Changes of prior ORIF of proximal left femoral fracture are seen. Ligaments Suboptimally assessed by CT. Muscles and Tendons Surrounding musculature appears within normal limits. Soft tissues Peritoneal dialysis catheter is noted deep in the pelvis. Bladder is decompressed. No free fluid is seen. Visualized bowel structures are unremarkable. IMPRESSION: Undisplaced right femoral intratrochanteric fracture. Chronic changes as described above. Electronically Signed   By: Inez Catalina M.D.   On: 09/21/2021 00:32   DG Humerus Right  Result Date: 09/21/2021 CLINICAL DATA:  Lost balance leading to fall.  Right arm pain. EXAM: RIGHT HUMERUS - 2+ VIEW COMPARISON:  Shoulder radiograph yesterday FINDINGS: Possible fracture arising from the lateral humeral head, not definitively seen on prior shoulder exam. Remote healed proximal humeral shaft fracture. No focal soft tissue abnormalities. IMPRESSION: 1. Possible fracture arising from the lateral humeral head. This was not confirmed on concurrent shoulder exam. If there is focal tenderness in this region, consider CT. 2. Remote healed proximal humeral shaft fracture. Electronically Signed   By: Keith Rake M.D.   On: 09/21/2021 01:58   DG Hip Unilat  With Pelvis 2-3 Views Right  Result Date: 09/20/2021 CLINICAL DATA:  Fall injury. EXAM: DG HIP (WITH OR WITHOUT PELVIS) 2-3V RIGHT COMPARISON:  Similar study 03/06/2014 FINDINGS: There is osteopenia  without evidence of displaced fractures. There is mild joint space loss and spurring at the hips, spurring and sclerosis of SI joints and pubic symphysis and enthesopathic changes in the pelvis. There has been an interval left hip nailing which appears well-healed. Externally inserted tubing entering from the right is coiled in the pelvic floor. Again noted are changes of prior L4-5 and L5-S1 discectomy with interbody metallic cylinder grafts. The iliofemoral arteries are heavily calcified. IMPRESSION: Osteopenia, degenerative and postsurgical changes, without evidence of fractures. Progressively heavy vascular calcifications. Additional findings discussed above. Electronically Signed   By: Telford Nab M.D.   On: 09/20/2021 23:32    Positive ROS: All other systems have been reviewed and were otherwise negative with the exception of those mentioned in the HPI and as above.  Physical Exam: General: Alert, no acute distress Cardiovascular: No pedal edema Respiratory: No cyanosis, no use of accessory musculature GI: No organomegaly, abdomen is soft and non-tender Skin: No lesions in the area of chief complaint Neurologic: Sensation intact distally Psychiatric: Patient is competent for consent with normal mood and affect Lymphatic: No axillary or cervical lymphadenopathy  MUSCULOSKELETAL: The patient is alert and oriented.  She has been nauseated.  The right shoulder shows multiple areas of the skin that have been scratched.  There is no sign of infection.  Passive motion of the shoulder is fairly good with mild pain.  No bruising or swelling noted.  Neurovascular status is good distally.  Head and neck unremarkable.  The right leg shows fairly good rotation and flexion passively without much pain.  She is tender over the greater trochanter primarily.  Left leg and left upper extremity are normal today.  Assessment: 1.  Nondisplaced right hip greater trochanteric fracture 2.  Sprain of the right  shoulder with contusion and old fracture noted  Plan: Patient will be rehabilitated with PT and pain medicine control.  She may use a sling on the right shoulder and arm as needed as well as ice. Will probably need skilled nursing placement although she resists this.   Should follow-up in my office in 10 days to 2 weeks for evaluation.    Park Breed, MD [phone]   09/21/2021 2:05 PM

## 2021-09-21 NOTE — ED Provider Notes (Signed)
[facility] Emergency Department Provider Note ____________________________________________   Event Date/Time   First MD Initiated Contact with Patient 09/21/21 0132     (approximate)  I have reviewed the triage vital signs and the nursing notes.   HISTORY  Chief Complaint Fall    HPI Andrea Stewart is a 75 y.o. female with history of hypertension, COPD, diabetes, end-stage renal disease on peritoneal dialysis, obesity who presents to the emergency department after she fell while getting off of the toilet tonight landing on her right side.  She does not think that she hit her head.  No neck or back pain.  She has not on blood thinners.  Complaining of right upper extremity pain from the shoulder to the elbow as well as right hip pain.  She is unable to ambulate.         Past Medical History:  Diagnosis Date   Backache, unspecified    s/p c-spine and l-spine fusions   COPD (chronic obstructive pulmonary disease) ([place])    Degeneration of intervertebral disc, site unspecified    Depressive disorder, not elsewhere classified    Disorder of bone and cartilage, unspecified    Family history of adverse reaction to anesthesia    mother did, but patient not sure of the complication   HTN (hypertension)    Hx of left heart catheterization by cutdown    a. 7 years ago; no significant cad; b. Lexiscan 2014: no significant ischemia, no significant EKG changes concerning for ischemia, EF 67%, overall low risk study   Kidney failure    Dr Cyndia Diver   Myalgia and myositis, unspecified    Obesity, unspecified    Personal history of tobacco use, presenting hazards to health    1/2 ppd   Plantar fascial fibromatosis    S/P cholecystectomy    S/p nephrectomy    Type II or unspecified type diabetes mellitus without mention of complication, not stated as uncontrolled    Unspecified essential hypertension    Unspecified hereditary and idiopathic peripheral  neuropathy    secondary to diabetes    Patient Active Problem List   Diagnosis Date Noted   Abdominal pain 02/24/2021   Hip fracture ([place]) 12/31/2020   Insomnia 09/13/2019   Pruritus 09/13/2019   PAD (peripheral artery disease) ([place]) 05/30/2018   Family history of colon cancer requiring screening colonoscopy 04/06/2018   Incarcerated ventral hernia 09/07/2017   Ventral hernia 09/07/2017   Atherosclerosis of native arteries of extremity with intermittent claudication ([place]) 06/21/2017   Morbid obesity ([place]) 10/21/2016   Edema 06/19/2016   Tobacco abuse counseling 12/17/2015   Tobacco use disorder 12/11/2015   Other specified postprocedural states 08/07/2015   CKD (chronic kidney disease), stage III ([place]) 05/13/2015   COPD (chronic obstructive pulmonary disease) with chronic bronchitis ([place]) 01/21/2015   Abnormal findings on diagnostic imaging of other specified body structures 01/21/2015   Thyroid mass 12/13/2014   Hx of left heart catheterization by cutdown    Single kidney 09/30/2014   Encounter for general adult medical examination without abnormal findings 03/03/2014   Personal history of diseases of the blood and blood-forming organs and certain disorders involving the immune mechanism 03/03/2014   Pain of right leg 01/28/2014   Irritable bowel syndrome 09/09/2012   Coronary atherosclerosis 07/26/2009   Diabetes mellitus type 2 with complications, uncontrolled 03/09/2007   DEPRESSION 03/09/2007   PERIPHERAL NEUROPATHY 03/09/2007   Essential hypertension 03/09/2007   DEGENERATIVE [place] DISEASE 03/09/2007   BACK  PAIN, CHRONIC 03/09/2007   FIBROMYALGIA 03/09/2007   Disorder of bone 03/09/2007   Major depressive disorder, single episode 03/09/2007   Narrowing of intervertebral disc space 03/09/2007   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   History of total knee replacement, right 08/04/2006   History of total knee replacement, left 11/10/2004    Past Surgical History:   Procedure Laterality Date   ABDOMINAL HYSTERECTOMY     APPENDECTOMY     BACK SURGERY     1966 and 2006    CARDIAC CATHETERIZATION     o.k. 2003   CARPAL TUNNEL RELEASE     CERVICAL DISCECTOMY     CHOLECYSTECTOMY     COLONOSCOPY  09/1999   colonoscopy-hemorrhoids, re-check 5 years (10/2006)   dexa     osteopenia (01/2004)   DIALYSIS/PERMA CATHETER INSERTION Right 01/05/2021   Procedure: DIALYSIS/PERMA CATHETER INSERTION;  Surgeon: Algernon Huxley, MD;  Location: [facility];  Service: Cardiovascular;  Laterality: Right;   DIALYSIS/PERMA CATHETER REMOVAL N/A 02/05/2021   Procedure: DIALYSIS/PERMA CATHETER REMOVAL;  Surgeon: Algernon Huxley, MD;  Location: [place] [facility];  Service: Cardiovascular;  Laterality: N/A;   ELBOW SURGERY     ESOPHAGOGASTRODUODENOSCOPY (EGD) WITH PROPOFOL N/A 07/23/2020   Procedure: ESOPHAGOGASTRODUODENOSCOPY (EGD) WITH PROPOFOL;  Surgeon: Robert Bellow, MD;  Location: [facility] ENDOSCOPY;  Service: Endoscopy;  Laterality: N/A;  TRAVEL TO O.R. - PATIENT TO HAVE SURGERY   FEMUR IM NAIL Left 01/01/2021   Procedure: INTRAMEDULLARY (IM) NAIL FEMORAL;  Surgeon: Lovell Sheehan, MD;  Location: [facility];  Service: Orthopedics;  Laterality: Left;   FOOT SURGERY     x 3   HIP FRACTURE SURGERY Left    JOINT REPLACEMENT     KIDNEY DONATION  1991   LAPAROSCOPIC ABDOMINAL EXPLORATION N/A 07/23/2020   Procedure: LAPAROSCOPIC ABDOMINAL EXPLORATION;  Surgeon: Robert Bellow, MD;  Location: [facility];  Service: General;  Laterality: N/A;   LUMBAR FUSION     NECK SURGERY     06/2005   Problem with general Anesthesia     02/2006   REPLACEMENT TOTAL KNEE  10/2004   trigger finger surgery     UPPER GI ENDOSCOPY N/A 07/23/2020   Procedure: UPPER GI ENDOSCOPY;  Surgeon: Robert Bellow, MD;  Location: [facility] ORS;  Service: General;  Laterality: N/A;   VENTRAL HERNIA REPAIR N/A 09/08/2017   Primary repair of a 10 x 15 mm infraumbilical fascial defect.  Surgeon:  Robert Bellow, MD;  Location: [facility];  Service: General;  Laterality: N/A;   VESICOVAGINAL FISTULA CLOSURE W/ TAH     VIDEO BRONCHOSCOPY Bilateral 04/02/2015   Procedure: VIDEO BRONCHOSCOPY WITHOUT FLUORO;  Surgeon: Juanito Doom, MD;  Location: [facility] ENDOSCOPY;  Service: Cardiopulmonary;  Laterality: Bilateral;    Prior to Admission medications   Medication Sig Start Date End Date Taking? Authorizing Provider  acetaminophen (TYLENOL) 325 MG tablet Take 2 tablets (650 mg total) by mouth every 6 (six) hours as needed for mild pain or headache (headache). 01/09/21  Yes Ezekiel Slocumb, DO  ALPRAZolam Duanne Moron) 0.5 MG tablet Take 0.5 mg by mouth at bedtime. 01/26/21  Yes [provider]  aspirin EC 81 MG EC tablet Take 1 tablet (81 mg total) by mouth 2 (two) times daily. Swallow whole. 01/09/21  Yes Nicole Kindred A, DO  atorvastatin (LIPITOR) 20 MG tablet TAKE 1 TABLET BY MOUTH DAILY Patient taking differently: Take 20 mg by mouth daily with supper. 08/28/19  Yes  Crecencio Mc, MD  budesonide-formoterol [facility]) 160-4.5 MCG/ACT inhaler Inhale 2 puffs into the lungs daily. 11/26/14  Yes [provider]  Continuous Blood Gluc Receiver ([place]) [place] See admin instructions. 03/08/19  Yes [provider]  ergocalciferol (VITAMIN D2) 1.25 MG (50000 UT) capsule Take by mouth once a week. 04/30/21  Yes [provider]  furosemide (LASIX) 80 MG tablet Take 1 tablet (80 mg total) by mouth daily. 08/19/21  Yes Minna Merritts, MD  hydrOXYzine (ATARAX/VISTARIL) 25 MG tablet Take 1 tablet (25 mg total) by mouth 2 (two) times daily as needed for anxiety. 01/09/21  Yes Nicole Kindred A, DO  insulin NPH Human (NOVOLIN N) 100 UNIT/ML injection Novolin N NPH U-100 Insulin isophane 100 unit/mL subcutaneous susp  INJECT 20 UNITS IN THE MORNING AND 35 UNITS IN THE EVENING. MAX DAILY DOSE OF 100 UNITS 12/10/20  Yes [provider]  insulin regular human  CONCENTRATED (HUMULIN R U-500 KWIKPEN) 500 UNIT/ML KwikPen Inject into the skin as needed. 05/02/20  Yes [provider]  Magnesium 500 MG TABS 1 tablet with a meal   Yes [provider]  metoprolol succinate (TOPROL-XL) 25 MG 24 hr tablet Take 1 tablet (25 mg total) by mouth daily. Take with or immediately following a meal. 08/19/21  Yes Gollan, Kathlene November, MD  Multiple Vitamins-Minerals (KP WOMENS 50+ DAILY FORMULA) TABS Take by mouth daily. 05/02/20  Yes [provider]  nitroGLYCERIN (NITROSTAT) 0.4 MG SL tablet Place 1 tablet (0.4 mg total) under the tongue every 5 (five) minutes x 3 doses as needed for chest pain. Maximum 3 tablets 08/19/21  Yes Gollan, Kathlene November, MD  Nutritional Supplements (,FEEDING SUPPLEMENT, PROSOURCE PLUS) liquid Take 30 mLs by mouth daily. 01/09/21  Yes Nicole Kindred A, DO  Nutritional Supplements (FEEDING SUPPLEMENT, NEPRO CARB STEADY,) LIQD Take 237 mLs by mouth daily. 01/09/21  Yes Nicole Kindred A, DO  ondansetron (ZOFRAN) 4 MG tablet Take 1 tablet (4 mg total) by mouth every 6 (six) hours as needed for nausea. 01/09/21  Yes Nicole Kindred A, DO  pantoprazole (PROTONIX) 40 MG tablet Take 40 mg by mouth 2 (two) times daily. 12/10/20  Yes [provider]  polyethylene glycol (MIRALAX / GLYCOLAX) 17 g packet Take 17 g by mouth daily. 01/09/21  Yes Nicole Kindred A, DO  Sennosides (SENNA) 8.6 MG CAPS Take 1 capsule by mouth at bedtime as needed. 09/12/20  Yes [provider]  Spacer/Aero-Holding Chambers (AEROCHAMBER MV) inhaler Use as instructed 01/20/15  Yes Juanito Doom, MD  spironolactone (ALDACTONE) 25 MG tablet Take 25 mg by mouth daily. 04/30/21  Yes [provider]  vancomycin (VANCOCIN) 125 MG capsule Take 125 mg by mouth 4 (four) times daily. Patient not taking: Reported on 09/21/2021 08/13/21   [provider]  valsartan-hydrochlorothiazide (DIOVAN-HCT) 320-25 MG per tablet TAKE ONE (1) TABLET EACH DAY  04/15/15 10/29/15  Minna Merritts, MD    Allergies Morphine, Prednisone, Gabapentin, Hydralazine, Nsaids, and Penicillins  Family History  Problem Relation Age of Onset   Hypertension Mother    Colon cancer Mother    Cancer Mother        colon   Coronary artery disease Mother    Diabetes Mother    Prostate cancer Father    COPD Father    Cancer Father        prostate   Heart attack Brother 80   Sleep apnea Brother    Diabetes Brother  Diabetes Paternal Aunt    Diabetes Paternal Uncle    Cancer Daughter        Brain, lungs, liver, spine, kidney    Social History Social History   Tobacco Use   Smoking status: Every Day    Packs/day: 0.25    Years: 30.00    Pack years: 7.50    Types: Cigarettes   Smokeless tobacco: Never   Tobacco comments:    currently smoking .25 ppd, trying to quit 02/13/16  Substance Use Topics   Alcohol use: No    Alcohol/week: 0.0 standard drinks   Drug use: No    Review of Systems Constitutional: No fever. Eyes: No visual changes. ENT: No sore throat. Cardiovascular: Denies chest pain. Respiratory: Denies shortness of breath. Gastrointestinal: No nausea, vomiting, diarrhea. Genitourinary: Negative for dysuria. Musculoskeletal: Negative for back pain. Skin: Negative for rash. Neurological: Negative for focal weakness or numbness.   ____________________________________________   PHYSICAL EXAM:  VITAL SIGNS: ED Triage Vitals  Enc Vitals Group     BP 09/21/21 0035 119/79     Pulse Rate 09/20/21 2033 75     Resp 09/20/21 2033 20     Temp 09/20/21 2033 98 F (36.7 C)     Temp Source 09/20/21 2033 Oral     SpO2 09/20/21 2033 97 %     Weight 09/20/21 2036 156 lb (70.8 kg)     Height 09/20/21 2036 4\' 11"  (1.499 m)     Head Circumference --      Peak Flow --      Pain Score 09/20/21 2035 10     Pain Loc --      Pain Edu? --      Excl. in [place]? --    CONSTITUTIONAL: Alert and oriented and responds appropriately to questions.   Elderly, obese, appears uncomfortable HEAD: Normocephalic; atraumatic EYES: Conjunctivae clear, PERRL, EOMI ENT: normal nose; no rhinorrhea; moist mucous membranes; pharynx without lesions noted; no dental injury; no septal hematoma NECK: Supple, no meningismus, no LAD; no midline spinal tenderness, step-off or deformity; trachea midline CARD: RRR; S1 and S2 appreciated; no murmurs, no clicks, no rubs, no gallops RESP: Normal chest excursion without splinting or tachypnea; breath sounds clear and equal bilaterally; no wheezes, no rhonchi, no rales; no hypoxia or respiratory distress CHEST:  chest wall stable, no crepitus or ecchymosis or deformity, nontender to palpation; no flail chest ABD/GI: Normal bowel sounds; non-distended; soft, non-tender, no rebound, no guarding; no ecchymosis or other lesions noted, PD catheter noted in the right lower abdomen without surrounding redness, warmth or drainage PELVIS:  stable, nontender to palpation BACK:  The back appears normal and is non-tender to palpation, there is no CVA tenderness; no midline spinal tenderness, step-off or deformity EXT: Tender to palpation over the right lateral and anterior hip without leg length discrepancy or deformity.  Also tender to palpation over the right shoulder and proximal humerus as well as the right elbow without deformity.  2+ radial and DP pulses bilaterally.  Compartments soft. SKIN: Normal color for age and race; warm NEURO: Moves all extremities equally, normal speech, no facial asymmetry PSYCH: The patient's mood and manner are appropriate. Grooming and personal hygiene are appropriate.  ____________________________________________   LABS (all labs ordered are listed, but only abnormal results are displayed)  Labs Reviewed  CBC WITH DIFFERENTIAL/PLATELET - Abnormal; Notable for the following components:      Result Value   WBC 12.9 (*)    Neutro Abs 10.2 (*)  All other components within normal limits   BASIC METABOLIC PANEL - Abnormal; Notable for the following components:   Sodium 134 (*)    Chloride 94 (*)    Glucose, Bld 125 (*)    BUN 33 (*)    Creatinine, Ser 7.53 (*)    GFR, Estimated 5 (*)    All other components within normal limits  CBG MONITORING, ED - Abnormal; Notable for the following components:   Glucose-Capillary 110 (*)    All other components within normal limits  CBG MONITORING, ED - Abnormal; Notable for the following components:   Glucose-Capillary 120 (*)    All other components within normal limits  RESP PANEL BY RT-PCR (FLU A&B, COVID) ARPGX2  PROTIME-INR  CBG MONITORING, ED  TYPE AND SCREEN   ____________________________________________  EKG   ____________________________________________  RADIOLOGY I, Mccabe Gloria, personally viewed and evaluated these images (plain radiographs) as part of my medical decision making, as well as reviewing the written report by the radiologist.  ED MD interpretation: Possible right humeral head fracture.  Right intertrochanteric hip fracture.  Official radiology report(s): DG Shoulder Right  Result Date: 09/20/2021 CLINICAL DATA:  Recent fall with shoulder pain, initial encounter EXAM: RIGHT SHOULDER - 2+ VIEW COMPARISON:  03/26/2009 FINDINGS: There are changes consistent with prior healed right proximal humeral fracture. Degenerative changes of the acromioclavicular joint are seen. No acute fracture or dislocation is noted. The underlying bony thorax is within normal limits. Postsurgical changes in the cervical spine are seen. IMPRESSION: Healed prior proximal right humeral fracture. No acute abnormality noted. Electronically Signed   By: Inez Catalina M.D.   On: 09/20/2021 23:30   DG Elbow 2 Views Right  Result Date: 09/21/2021 CLINICAL DATA:  Lost balance on toilet leading to fall. Right elbow pain. EXAM: RIGHT ELBOW - 2 VIEW COMPARISON:  None. FINDINGS: There is no evidence of fracture, dislocation, or joint  effusion. Mild degenerative change with spurring. Enthesopathic change about the lateral epicondyle. Soft tissues are unremarkable. Vascular calcifications are seen. IMPRESSION: No fracture or subluxation of the right elbow. Electronically Signed   By: Keith Rake M.D.   On: 09/21/2021 01:56   CT HEAD WO CONTRAST (5MM)  Result Date: 09/21/2021 CLINICAL DATA:  Recent fall with headaches, initial encounter EXAM: CT HEAD WITHOUT CONTRAST TECHNIQUE: Contiguous axial images were obtained from the base of the skull through the vertex without intravenous contrast. COMPARISON:  12/31/2015 FINDINGS: Brain: No evidence of acute infarction, hemorrhage, hydrocephalus, extra-axial collection or mass lesion/mass effect. Mild atrophic and chronic white matter ischemic changes are noted. Vascular: No hyperdense vessel or unexpected calcification. Skull: Normal. Negative for fracture or focal lesion. Sinuses/Orbits: No acute finding. Other: None. IMPRESSION: Chronic atrophic and ischemic changes without acute abnormality. Electronically Signed   By: Inez Catalina M.D.   On: 09/21/2021 00:25   CT Shoulder Right Wo Contrast  Result Date: 09/21/2021 CLINICAL DATA:  Right shoulder fracture.  Fall. EXAM: CT OF THE UPPER RIGHT EXTREMITY WITHOUT CONTRAST TECHNIQUE: Multidetector CT imaging of the upper right extremity was performed according to the standard protocol. COMPARISON:  Radiograph dated 09/21/2021. FINDINGS: Bones/Joint/Cartilage There is a nondisplaced fracture of the lateral humeral head with involvement of the greater tuberosity. Evaluation of the fracture is very limited due to advanced osteopenia. Old healed fracture of the humeral diaphysis noted. There is degenerative changes of the shoulder with spurring. No dislocation. No significant joint effusion. Probable chronic thickening of the joint capsule. Ligaments Suboptimally assessed by CT. Muscles  and Tendons No acute findings.  No intramuscular hematoma. Soft  tissues The soft tissues are unremarkable. IMPRESSION: 1. Nondisplaced fracture of the lateral humeral head with involvement of the greater tuberosity. Evaluation of the fracture is very limited due to advanced osteopenia. 2. Old healed fracture of the humeral diaphysis. 3. Degenerative changes of the shoulder with spurring. Electronically Signed   By: Anner Crete M.D.   On: 09/21/2021 03:17   CT Hip Right Wo Contrast  Result Date: 09/21/2021 CLINICAL DATA:  Recent fall with findings suspicious for hip fracture with negative x-rays, initial encounter EXAM: CT OF THE RIGHT HIP WITHOUT CONTRAST TECHNIQUE: Multidetector CT imaging of the right hip was performed according to the standard protocol. Multiplanar CT image reconstructions were also generated. COMPARISON:  Plain film from earlier in the same day. FINDINGS: Bones/Joint/Cartilage Postsurgical changes are noted in the lower lumbar spine. Degenerative changes of the sacroiliac joints are seen. There are findings consistent with a right intratrochanteric femoral fracture without significant displacement. No femoral neck fracture is seen. No dislocation is noted. Chronic coccygeal fracture is noted. The remainder of the bony pelvis shows no acute abnormality. Changes of prior ORIF of proximal left femoral fracture are seen. Ligaments Suboptimally assessed by CT. Muscles and Tendons Surrounding musculature appears within normal limits. Soft tissues Peritoneal dialysis catheter is noted deep in the pelvis. Bladder is decompressed. No free fluid is seen. Visualized bowel structures are unremarkable. IMPRESSION: Undisplaced right femoral intratrochanteric fracture. Chronic changes as described above. Electronically Signed   By: Inez Catalina M.D.   On: 09/21/2021 00:32   DG Humerus Right  Result Date: 09/21/2021 CLINICAL DATA:  Lost balance leading to fall.  Right arm pain. EXAM: RIGHT HUMERUS - 2+ VIEW COMPARISON:  Shoulder radiograph yesterday FINDINGS:  Possible fracture arising from the lateral humeral head, not definitively seen on prior shoulder exam. Remote healed proximal humeral shaft fracture. No focal soft tissue abnormalities. IMPRESSION: 1. Possible fracture arising from the lateral humeral head. This was not confirmed on concurrent shoulder exam. If there is focal tenderness in this region, consider CT. 2. Remote healed proximal humeral shaft fracture. Electronically Signed   By: Keith Rake M.D.   On: 09/21/2021 01:58   DG Hip Unilat  With Pelvis 2-3 Views Right  Result Date: 09/20/2021 CLINICAL DATA:  Fall injury. EXAM: DG HIP (WITH OR WITHOUT PELVIS) 2-3V RIGHT COMPARISON:  Similar study 03/06/2014 FINDINGS: There is osteopenia without evidence of displaced fractures. There is mild joint space loss and spurring at the hips, spurring and sclerosis of SI joints and pubic symphysis and enthesopathic changes in the pelvis. There has been an interval left hip nailing which appears well-healed. Externally inserted tubing entering from the right is coiled in the pelvic floor. Again noted are changes of prior L4-5 and L5-S1 discectomy with interbody metallic cylinder grafts. The iliofemoral arteries are heavily calcified. IMPRESSION: Osteopenia, degenerative and postsurgical changes, without evidence of fractures. Progressively heavy vascular calcifications. Additional findings discussed above. Electronically Signed   By: Telford Nab M.D.   On: 09/20/2021 23:32    ____________________________________________   PROCEDURES  Procedure(s) performed (including Critical Care):  Procedures    ____________________________________________   INITIAL IMPRESSION / ASSESSMENT AND PLAN / ED COURSE  As part of my medical decision making, I reviewed the following data within the [place] History obtained from family, Nursing notes reviewed and incorporated, Labs reviewed , Old chart reviewed, Radiograph reviewed , Discussed  with admitting physician , A  consult was requested and obtained from this/these consultant(s) Orthopedics, CTs reviewed, Notes from prior ED visits, and [place] Controlled Substance Database         Patient here after mechanical fall.  Imaging obtained from triage shows no intracranial abnormality.  She does appear to have an intertrochanteric right hip fracture.  X-ray of the right shoulder shows no acute abnormality but patient is having significant pain.  Will obtain x-rays of the humerus and elbow.  Will give pain medication.  Will obtain labs and discuss with orthopedics on-call.  ED PROGRESS  Discussed with Dr. Sabra Heck on-call for orthopedics.  He has reviewed patient's imaging.  He states that he thinks she may have a greater trochanter fracture rather than an intertrochanteric fracture and recommends obtaining MRI of her right hip.  Recommends admission to the hospitalist service and he will see her in consultation.   2:45 AM  X-ray of the right humerus shows a possible humeral head fracture.  Radiologist recommends CT scan for further evaluation.   4:05 AM  Pt's CT scan concerning for humeral head fracture involving the greater tuberosity.  We will place her in a sling.  Patient now stating that she cannot have an MRI of her hip because "a piece of machinery broke off in my back" when she was having spine surgery.  She states she has a card provided by her surgeon that so she can never have an MRI.   Labs reviewed and show mild leukocytosis which is likely reactive.  She has chronic kidney disease.  No significant electrolyte derangement.    5:10 AM Discussed patient's case with hospitalist, Dr. Sidney Ace.  I have recommended admission and patient (and family if present) agree with this plan. Admitting physician will place admission orders.   I reviewed all nursing notes, vitals, pertinent previous records and reviewed/interpreted all EKGs, lab and urine results, imaging (as  available).  ____________________________________________   FINAL CLINICAL IMPRESSION(S) / ED DIAGNOSES  Final diagnoses:  Fall, initial encounter  Closed intertrochanteric fracture of hip, right, initial encounter ([place])  Humeral head fracture, right, closed, initial encounter     ED Discharge Orders     None       *Please note:  Andrea Stewart was evaluated in Emergency Department on 09/21/2021 for the symptoms described in the history of present illness. She was evaluated in the context of the global COVID-19 pandemic, which necessitated consideration that the patient might be at risk for infection with the SARS-CoV-2 virus that causes COVID-19. Institutional protocols and algorithms that pertain to the evaluation of patients at risk for COVID-19 are in a state of rapid change based on information released by regulatory bodies including the CDC and federal and state organizations. These policies and algorithms were followed during the patient's care in the ED.  Some ED evaluations and interventions may be delayed as a result of limited staffing during and the pandemic.*   Note:  This document was prepared using Dragon voice recognition software and may include unintentional dictation errors.    Cylas Falzone, Delice Bison, DO 09/21/21 [phone]

## 2021-09-21 NOTE — Plan of Care (Signed)
  Problem: Education: Goal: Knowledge of General Education information will improve Description: Including pain rating scale, medication(s)/side effects and non-pharmacologic comfort measures Outcome: Progressing   Problem: Activity: Goal: Risk for activity intolerance will decrease Outcome: Progressing   Problem: Nutrition: Goal: Adequate nutrition will be maintained Outcome: Progressing   Problem: Pain Managment: Goal: General experience of comfort will improve Outcome: Progressing   Problem: Safety: Goal: Ability to remain free from injury will improve Outcome: Progressing   Problem: Skin Integrity: Goal: Risk for impaired skin integrity will decrease Outcome: Progressing   Problem: Education: Goal: Ability to describe self-care measures that may prevent or decrease complications (Diabetes Survival Skills Education) will improve Outcome: Progressing   Problem: Skin Integrity: Goal: Risk for impaired skin integrity will decrease Outcome: Progressing   Problem: Education: Goal: Verbalization of understanding the information provided (i.e., activity precautions, restrictions, etc) will improve Outcome: Progressing   Problem: Activity: Goal: Ability to ambulate and perform ADLs will improve Outcome: Progressing   Problem: Self-Concept: Goal: Ability to maintain and perform role responsibilities to the fullest extent possible will improve Outcome: Progressing

## 2021-09-22 LAB — BASIC METABOLIC PANEL
Anion gap: 12 (ref 5–15)
BUN: 42 mg/dL — ABNORMAL HIGH (ref 8–23)
CO2: 25 mmol/L (ref 22–32)
Calcium: 8.2 mg/dL — ABNORMAL LOW (ref 8.9–10.3)
Chloride: 92 mmol/L — ABNORMAL LOW (ref 98–111)
Creatinine, Ser: 8.05 mg/dL — ABNORMAL HIGH (ref 0.44–1.00)
GFR, Estimated: 5 mL/min — ABNORMAL LOW (ref >60)
Glucose, Bld: 219 mg/dL — ABNORMAL HIGH (ref 70–99)
Potassium: 3.2 mmol/L — ABNORMAL LOW (ref 3.5–5.1)
Sodium: 129 mmol/L — ABNORMAL LOW (ref 135–145)

## 2021-09-22 LAB — CBC
HCT: 34.3 % — ABNORMAL LOW (ref 36.0–46.0)
Hemoglobin: 11.2 g/dL — ABNORMAL LOW (ref 12.0–15.0)
MCH: 30 pg (ref 26.0–34.0)
MCHC: 32.7 g/dL (ref 30.0–36.0)
MCV: 92 fL (ref 80.0–100.0)
Platelets: 240 10*3/uL (ref 150–400)
RBC: 3.73 MIL/uL — ABNORMAL LOW (ref 3.87–5.11)
RDW: 14.2 % (ref 11.5–15.5)
WBC: 9.9 10*3/uL (ref 4.0–10.5)
nRBC: 0 % (ref 0.0–0.2)

## 2021-09-22 LAB — GLUCOSE, CAPILLARY
Glucose-Capillary: 201 mg/dL — ABNORMAL HIGH (ref 70–99)
Glucose-Capillary: 328 mg/dL — ABNORMAL HIGH (ref 70–99)
Glucose-Capillary: 293 mg/dL — ABNORMAL HIGH (ref 70–99)
Glucose-Capillary: 235 mg/dL — ABNORMAL HIGH (ref 70–99)

## 2021-09-22 MED ORDER — VANCOMYCIN 100 MG/ML FOR DIALYSIS: 1000.0000 mg | INJECTION | Freq: Once | Status: DC

## 2021-09-22 MED ORDER — INSULIN ASPART 100 UNIT/ML IJ SOLN
3.0000 [IU] | Freq: Three times a day (TID) | INTRAMUSCULAR | Status: DC
  Administered 2021-09-23 – 2021-09-25 (×8): 3 [IU] via SUBCUTANEOUS
  Filled 2021-09-22 (×8): qty 1

## 2021-09-22 MED ORDER — OXYCODONE HCL 5 MG PO TABS
2.5000 mg | ORAL_TABLET | ORAL | Status: DC | PRN
  Administered 2021-09-22 – 2021-09-30 (×15): 2.5 mg via ORAL
  Filled 2021-09-22 (×15): qty 1

## 2021-09-22 MED ORDER — INSULIN NPH (HUMAN) (ISOPHANE) 100 UNIT/ML ~~LOC~~ SUSP
20.0000 [IU] | Freq: Two times a day (BID) | SUBCUTANEOUS | Status: DC
  Administered 2021-09-23 – 2021-09-24 (×2): 20 [IU] via SUBCUTANEOUS
  Filled 2021-09-22: qty 10

## 2021-09-22 MED ORDER — VANCOMYCIN 100 MG/ML FOR DIALYSIS
Freq: Once | Status: AC
  Filled 2021-09-22: qty 3000

## 2021-09-22 NOTE — Progress Notes (Signed)
[facility]  ROUNDING NOTE   Subjective:   Andrea Stewart is a 75 year old Caucasian female with past medical conditions including depression, COPD, hypertension, diabetes, and end-stage renal disease on peritoneal dialysis.  She presents to the emergency room after a fall while going to the bathroom and landing on her right shoulder and right hip.  She is now complaining of right shoulder and hip pain.  Patient will be admitted for Ventral hernia without obstruction or gangrene [K43.9] Closed right hip fracture ([place]) [S72.001A] Fall, initial encounter [W19.XXXA] Closed intertrochanteric fracture of hip, right, initial encounter ([place]) [S72.141A] Humeral head fracture, right, closed, initial encounter [S42.291A]  Patient is known to our office and receives peritoneal dialysis care from Dr. Juleen China.    Patient seen while working with physical therapy, transitioning from bedside commode to recliner chair.  Patient in therapy states no surgery scheduled, pain and medical management for hip and shoulder fracture.  Patient continues to be adamant about returning home at discharge, not interested in rehab placement.  Complains of leaking from PD catheter.  Pain well managed at this time  Objective:  Vital signs in last 24 hours:  Temp:  [97.5 F (36.4 C)-98.3 F (36.8 C)] 97.8 F (36.6 C) (11/08 0749) Pulse Rate:  [67-75] 72 (11/08 0749) Resp:  [16-20] 16 (11/08 0749) BP: (97-135)/(53-77) 107/57 (11/08 0749) SpO2:  [96 %-100 %] 100 % (11/08 0749) Weight:  [65.1 kg] 65.1 kg (11/07 1706)  Weight change: -5.661 kg Filed Weights   09/20/21 2036 09/21/21 1706  Weight: 70.8 kg 65.1 kg    Intake/Output: No intake/output data recorded.   Intake/Output this shift:  Total I/O In: 350 [P.O.:350] Out: -   Physical Exam: General: NAD, resting in bed  Head: Normocephalic, atraumatic. Moist oral mucosal membranes  Eyes: Anicteric  Lungs:  Clear to auscultation, normal effort   Heart: Regular rate and rhythm  Abdomen:  Soft, nontender, nondistended  Extremities: No peripheral edema.  Neurologic: alert, oriented, moving all four extremities  Skin: No lesions  Access: Peritoneal dialysis catheter    Basic Metabolic Panel: Recent Labs  Lab 09/21/21 0225 09/22/21 0646  NA 134* 129*  K 3.5 3.2*  CL 94* 92*  CO2 25 25  GLUCOSE 125* 219*  BUN 33* 42*  CREATININE 7.53* 8.05*  CALCIUM 8.9 8.2*     Liver Function Tests: No results for input(s): AST, ALT, ALKPHOS, BILITOT, PROT, ALBUMIN in the last 168 hours. No results for input(s): LIPASE, AMYLASE in the last 168 hours. No results for input(s): AMMONIA in the last 168 hours.  CBC: Recent Labs  Lab 09/21/21 0225 09/22/21 0646  WBC 12.9* 9.9  NEUTROABS 10.2*  --   HGB 13.1 11.2*  HCT 39.3 34.3*  MCV 92.0 92.0  PLT 276 240     Cardiac Enzymes: No results for input(s): CKTOTAL, CKMB, CKMBINDEX, TROPONINI in the last 168 hours.  BNP: Invalid input(s): POCBNP  CBG: Recent Labs  Lab 09/21/21 0301 09/21/21 1127 09/21/21 1721 09/21/21 2056 09/22/21 0819  GLUCAP 120* 162* 90 241* 201*     Microbiology: Results for orders placed or performed during the hospital encounter of 09/21/21  Resp Panel by RT-PCR (Flu A&B, Covid) Nasopharyngeal Swab     Status: None   Collection Time: 09/21/21  2:25 AM   Specimen: Nasopharyngeal Swab; Nasopharyngeal(NP) swabs in vial transport medium  Result Value Ref Range Status   SARS Coronavirus 2 by RT PCR NEGATIVE NEGATIVE Final    Comment: (NOTE) SARS-CoV-2  target nucleic acids are NOT DETECTED.  The SARS-CoV-2 RNA is generally detectable in upper respiratory specimens during the acute phase of infection. The lowest concentration of SARS-CoV-2 viral copies this assay can detect is 138 copies/mL. A negative result does not preclude SARS-Cov-2 infection and should not be used as the sole basis for treatment or other patient management decisions. A  negative result may occur with  improper specimen collection/handling, submission of specimen other than nasopharyngeal swab, presence of viral mutation(s) within the areas targeted by this assay, and inadequate number of viral copies(<138 copies/mL). A negative result must be combined with clinical observations, patient history, and epidemiological information. The expected result is Negative.  Fact Sheet for Patients:  EntrepreneurPulse.com.au  Fact Sheet for Healthcare Providers:  IncredibleEmployment.be  This test is no t yet approved or cleared by the [place] FDA and  has been authorized for detection and/or diagnosis of SARS-CoV-2 by FDA under an Emergency Use Authorization (EUA). This EUA will remain  in effect (meaning this test can be used) for the duration of the COVID-19 declaration under Section 564(b)(1) of the Act, 21 U.S.C.section 360bbb-3(b)(1), unless the authorization is terminated  or revoked sooner.       Influenza A by PCR NEGATIVE NEGATIVE Final   Influenza B by PCR NEGATIVE NEGATIVE Final    Comment: (NOTE) The Xpert Xpress SARS-CoV-2/FLU/RSV plus assay is intended as an aid in the diagnosis of influenza from Nasopharyngeal swab specimens and should not be used as a sole basis for treatment. Nasal washings and aspirates are unacceptable for Xpert Xpress SARS-CoV-2/FLU/RSV testing.  Fact Sheet for Patients: EntrepreneurPulse.com.au  Fact Sheet for Healthcare Providers: IncredibleEmployment.be  This test is not yet approved or cleared by the [place] FDA and has been authorized for detection and/or diagnosis of SARS-CoV-2 by FDA under an Emergency Use Authorization (EUA). This EUA will remain in effect (meaning this test can be used) for the duration of the COVID-19 declaration under Section 564(b)(1) of the Act, 21 U.S.C. section 360bbb-3(b)(1), unless the authorization  is terminated or revoked.  Performed at [facility], [address] [place], [place] [address]   Surgical pcr screen     Status: None   Collection Time: 09/21/21 10:15 AM   Specimen: Nasal Mucosa; Nasal Swab  Result Value Ref Range Status   MRSA, PCR NEGATIVE NEGATIVE Final   Staphylococcus aureus NEGATIVE NEGATIVE Final    Comment: (NOTE) The Xpert SA Assay (FDA approved for NASAL specimens in patients 45 years of age and older), is one component of a comprehensive surveillance program. It is not intended to diagnose infection nor to guide or monitor treatment. Performed at [facility], [address] [place], [place] [address]     Coagulation Studies: Recent Labs    09/21/21 0225  LABPROT 12.9  INR 1.0     Urinalysis: No results for input(s): COLORURINE, LABSPEC, PHURINE, GLUCOSEU, HGBUR, BILIRUBINUR, KETONESUR, PROTEINUR, UROBILINOGEN, NITRITE, LEUKOCYTESUR in the last 72 hours.  Invalid input(s): APPERANCEUR    Imaging: DG Shoulder Right  Result Date: 09/20/2021 CLINICAL DATA:  Recent fall with shoulder pain, initial encounter EXAM: RIGHT SHOULDER - 2+ VIEW COMPARISON:  03/26/2009 FINDINGS: There are changes consistent with prior healed right proximal humeral fracture. Degenerative changes of the acromioclavicular joint are seen. No acute fracture or dislocation is noted. The underlying bony thorax is within normal limits. Postsurgical changes in the cervical spine are seen. IMPRESSION: Healed prior proximal right humeral fracture. No acute abnormality noted. Electronically  Signed   By: Inez Catalina M.D.   On: 09/20/2021 23:30   DG Elbow 2 Views Right  Result Date: 09/21/2021 CLINICAL DATA:  Lost balance on toilet leading to fall. Right elbow pain. EXAM: RIGHT ELBOW - 2 VIEW COMPARISON:  None. FINDINGS: There is no evidence of fracture, dislocation, or joint effusion. Mild degenerative change with spurring. Enthesopathic change about the lateral  epicondyle. Soft tissues are unremarkable. Vascular calcifications are seen. IMPRESSION: No fracture or subluxation of the right elbow. Electronically Signed   By: Keith Rake M.D.   On: 09/21/2021 01:56   CT HEAD WO CONTRAST (5MM)  Result Date: 09/21/2021 CLINICAL DATA:  Recent fall with headaches, initial encounter EXAM: CT HEAD WITHOUT CONTRAST TECHNIQUE: Contiguous axial images were obtained from the base of the skull through the vertex without intravenous contrast. COMPARISON:  12/31/2015 FINDINGS: Brain: No evidence of acute infarction, hemorrhage, hydrocephalus, extra-axial collection or mass lesion/mass effect. Mild atrophic and chronic white matter ischemic changes are noted. Vascular: No hyperdense vessel or unexpected calcification. Skull: Normal. Negative for fracture or focal lesion. Sinuses/Orbits: No acute finding. Other: None. IMPRESSION: Chronic atrophic and ischemic changes without acute abnormality. Electronically Signed   By: Inez Catalina M.D.   On: 09/21/2021 00:25   CT Shoulder Right Wo Contrast  Result Date: 09/21/2021 CLINICAL DATA:  Right shoulder fracture.  Fall. EXAM: CT OF THE UPPER RIGHT EXTREMITY WITHOUT CONTRAST TECHNIQUE: Multidetector CT imaging of the upper right extremity was performed according to the standard protocol. COMPARISON:  Radiograph dated 09/21/2021. FINDINGS: Bones/Joint/Cartilage There is a nondisplaced fracture of the lateral humeral head with involvement of the greater tuberosity. Evaluation of the fracture is very limited due to advanced osteopenia. Old healed fracture of the humeral diaphysis noted. There is degenerative changes of the shoulder with spurring. No dislocation. No significant joint effusion. Probable chronic thickening of the joint capsule. Ligaments Suboptimally assessed by CT. Muscles and Tendons No acute findings.  No intramuscular hematoma. Soft tissues The soft tissues are unremarkable. IMPRESSION: 1. Nondisplaced fracture of the  lateral humeral head with involvement of the greater tuberosity. Evaluation of the fracture is very limited due to advanced osteopenia. 2. Old healed fracture of the humeral diaphysis. 3. Degenerative changes of the shoulder with spurring. Electronically Signed   By: Anner Crete M.D.   On: 09/21/2021 03:17   CT Hip Right Wo Contrast  Result Date: 09/21/2021 CLINICAL DATA:  Recent fall with findings suspicious for hip fracture with negative x-rays, initial encounter EXAM: CT OF THE RIGHT HIP WITHOUT CONTRAST TECHNIQUE: Multidetector CT imaging of the right hip was performed according to the standard protocol. Multiplanar CT image reconstructions were also generated. COMPARISON:  Plain film from earlier in the same day. FINDINGS: Bones/Joint/Cartilage Postsurgical changes are noted in the lower lumbar spine. Degenerative changes of the sacroiliac joints are seen. There are findings consistent with a right intratrochanteric femoral fracture without significant displacement. No femoral neck fracture is seen. No dislocation is noted. Chronic coccygeal fracture is noted. The remainder of the bony pelvis shows no acute abnormality. Changes of prior ORIF of proximal left femoral fracture are seen. Ligaments Suboptimally assessed by CT. Muscles and Tendons Surrounding musculature appears within normal limits. Soft tissues Peritoneal dialysis catheter is noted deep in the pelvis. Bladder is decompressed. No free fluid is seen. Visualized bowel structures are unremarkable. IMPRESSION: Undisplaced right femoral intratrochanteric fracture. Chronic changes as described above. Electronically Signed   By: Inez Catalina M.D.   On: 09/21/2021 00:32  DG Humerus Right  Result Date: 09/21/2021 CLINICAL DATA:  Lost balance leading to fall.  Right arm pain. EXAM: RIGHT HUMERUS - 2+ VIEW COMPARISON:  Shoulder radiograph yesterday FINDINGS: Possible fracture arising from the lateral humeral head, not definitively seen on prior  shoulder exam. Remote healed proximal humeral shaft fracture. No focal soft tissue abnormalities. IMPRESSION: 1. Possible fracture arising from the lateral humeral head. This was not confirmed on concurrent shoulder exam. If there is focal tenderness in this region, consider CT. 2. Remote healed proximal humeral shaft fracture. Electronically Signed   By: Keith Rake M.D.   On: 09/21/2021 01:58   DG Hip Unilat  With Pelvis 2-3 Views Right  Result Date: 09/20/2021 CLINICAL DATA:  Fall injury. EXAM: DG HIP (WITH OR WITHOUT PELVIS) 2-3V RIGHT COMPARISON:  Similar study 03/06/2014 FINDINGS: There is osteopenia without evidence of displaced fractures. There is mild joint space loss and spurring at the hips, spurring and sclerosis of SI joints and pubic symphysis and enthesopathic changes in the pelvis. There has been an interval left hip nailing which appears well-healed. Externally inserted tubing entering from the right is coiled in the pelvic floor. Again noted are changes of prior L4-5 and L5-S1 discectomy with interbody metallic cylinder grafts. The iliofemoral arteries are heavily calcified. IMPRESSION: Osteopenia, degenerative and postsurgical changes, without evidence of fractures. Progressively heavy vascular calcifications. Additional findings discussed above. Electronically Signed   By: Telford Nab M.D.   On: 09/20/2021 23:32     Medications:    dialysis solution 1.5% low-MG/low-CA     promethazine (PHENERGAN) injection (IM or IVPB)      (feeding supplement) PROSource Plus  30 mL Oral Daily   acetaminophen  1,000 mg Oral Q8H   ALPRAZolam  0.5 mg Oral QHS   atorvastatin  20 mg Oral Q supper   feeding supplement (NEPRO CARB STEADY)  237 mL Oral Q24H   furosemide  80 mg Oral Daily   gentamicin cream  1 application Topical Daily   insulin aspart  0-9 Units Subcutaneous TID AC & HS   insulin NPH Human  15 Units Subcutaneous BID AC & HS   metoprolol succinate  25 mg Oral Q breakfast    mometasone-formoterol  2 puff Inhalation BID   multivitamin   Oral Daily   pantoprazole  40 mg Oral BID   spironolactone  25 mg Oral Daily   Vitamin D (Ergocalciferol)  50,000 Units Oral Weekly   acetaminophen **FOLLOWED BY** [START ON 09/24/2021] acetaminophen, albuterol, ALPRAZolam, heparin, HYDROmorphone (DILAUDID) injection, hydrOXYzine, magnesium hydroxide, nitroGLYCERIN, ondansetron **OR** ondansetron (ZOFRAN) IV, ondansetron, polyethylene glycol, promethazine **OR** promethazine (PHENERGAN) injection (IM or IVPB) **OR** promethazine, senna, traZODone  Assessment/ Plan:  Ms. RONNI OSTERBERG is a 75 y.o.  female with past medical conditions including depression, COPD, hypertension, diabetes, and end-stage renal disease on peritoneal dialysis.  Patient has been admitted to [facility] for Ventral hernia without obstruction or gangrene [K43.9] Closed right hip fracture ([address]) [S72.001A] Fall, initial encounter [W19.XXXA] Closed intertrochanteric fracture of hip, right, initial encounter ([place]) [S72.141A] Humeral head fracture, right, closed, initial encounter [S42.291A]  [facility] [address]/73kg CCPD 8 hours 5 exchanges 2086ml fills.    End-stage renal disease on peritoneal dialysis.  PD treatment with no complications overnight.  Reported PD catheter leaking this AM.  HD RN assessed.  Will order prophylactic vancomycin 1 g intraperitoneal to dwell today.  Will drain before scheduled peritoneal treatment tonight.  Anemia of chronic kidney disease Lab Results  Component  Value Date   HGB 11.2 (L) 09/22/2021  Iron sucrose outpatient Remains at target  3. Secondary Hyperparathyroidism: with outpatient labs: PTH 3.8, phosphorus 6.0, calcium 8.3 on 08/31/21.   Lab Results  Component Value Date   CALCIUM 8.2 (L) 09/22/2021   CAION 1.13 (L) 07/23/2020   PHOS 4.1 01/06/2021  Calcium decreased today, will continue to monitor.  Receiving vitamin D weekly  4. Diabetes mellitus type II with  chronic kidney disease: insulin dependent. Home regimen includes NPH and Humulin. Most recent hemoglobin A1c is 9.7 on 09/21/21.  Glucose elevated this a.m.   LOS: 1 Guneet Delpino 11/8/202211:00 AM

## 2021-09-22 NOTE — Progress Notes (Signed)
Physical Therapy Evaluation Patient Details Name: Andrea Stewart MRN: [id number] DOB: [birth year] Today's Date: 09/22/2021  History of Present Illness  Andrea Stewart is a 75 y.o. Caucasian female with medical history significant for COPD, depression, hypertension, type 2 diabetes mellitus end-stage renal disease on peritoneal dialysis and obesity, who presented to the emergency room following mechanical fall getting to her BSC and fell on her right side with subsequent right shoulder and hip pain.  Imaging showed a nondisplaced fracture of the lateral humeral head and an undisplaced right femoral intratrochanteric fracture.  Clinical Impression  Pt was alert and oriented and in bedside chair upon start of Pt session.  Patient had some hesitancy with wheelchair mobility was able to perform some minimal standing activities in today's session with min to mod assist from other to prevent loss of balance.  Patient has fair lower extremity strength with increased weakness on the right side compared to the left.  This weakness seems primarily limited due to pain.  Patient able to complete sit to stand with mod assist on first attempt and min to mod assist on second and third attempts.  First attempt patient reported feeling as though she was falling and was unable to support herself but this improved with increased repetitions.  Patient will require further training with hemiwalker as patient is never uses the device before as primary means of ambulation.  Patient will benefit from continued skilled physical therapy in order to address the above deficits and promote optimal return to prior level of function, recommend discharge to short-term care rehabilitation facility at this time.     Recommendations for follow up therapy are one component of a multi-disciplinary discharge planning process, led by the attending physician.  Recommendations may be updated based on patient status, additional functional  criteria and insurance authorization.  Follow Up Recommendations Skilled nursing-short term rehab (<3 hours/day)    Assistance Recommended at Discharge Frequent or constant Supervision/Assistance  Functional Status Assessment Patient has had a recent decline in their functional status and/or demonstrates limited ability to make significant improvements in function in a reasonable and predictable amount of time  Equipment Recommendations  Other (comment) (hemiwalker)    Recommendations for Other Services Rehab consult     Precautions / Restrictions Precautions Precautions: Fall Precaution Comments: WBAT on R LE and R UE, R UE remained in sling for duration of therapy session. Required Braces or Orthoses: Sling Restrictions Weight Bearing Restrictions:  (WBAT on R UE and R LE) RUE Weight Bearing: Weight bearing as tolerated RLE Weight Bearing: Weight bearing as tolerated Other Position/Activity Restrictions: RUE sling for comfort      Mobility  Bed Mobility Overal bed mobility: Needs Assistance Bed Mobility: Supine to Sit     Supine to sit: Mod assist;HOB elevated     General bed mobility comments: assist for trunk and scooting to EOB Patient Response: Cooperative  Transfers Overall transfer level: Needs assistance Equipment used: Hemi-walker Transfers: Sit to/from Stand Sit to Stand: Mod assist           General transfer comment: Mod A on first attemempt, min-mod A on 2nd and third STS transfer    Ambulation/Gait Ambulation/Gait assistance: Mod assist Gait Distance (Feet): 2 Feet Assistive device: Hemi-walker Gait Pattern/deviations: Step-to pattern;Decreased step length - right;Decreased step length - left       General Gait Details: Pt able to ambulate 1 step forward and back with min-mod A from author to prevent LOB  Stairs  Wheelchair Mobility    Modified Rankin (Stroke Patients Only)       Balance Overall balance assessment:  History of Falls;Needs assistance Sitting-balance support: Single extremity supported       Standing balance support: Single extremity supported                                 Pertinent Vitals/Pain Pain Assessment: 0-10 Pain Score: 3  Pain Location: R shoulder Pain Descriptors / Indicators: Discomfort;Dull;Grimacing Pain Intervention(s): Limited activity within patient's tolerance;Monitored during session    Home Living Family/patient expects to be discharged to:: Private residence Living Arrangements: Spouse/significant other Available Help at Discharge: Other (Comment) (has 4 sond/grandsons but currently unable to determine if they will be able to stay overnight due to husband being in hospital.) Type of Home: House Home Access: Ramped entrance       Home Layout: One level Home Equipment: Rollator (4 wheels);BSC/3in1;Wheelchair - manual      Prior Function Prior Level of Function : Needs assist       Physical Assist : Mobility (physical) Mobility (physical): Transfers   Mobility Comments: Pt reports she utilizes wheelchair to get in and out of house utilizing ramped entrance. She reports her husband provided assistance with this task prior to his recent fall.       Hand Dominance   Dominant Hand: Right    Extremity/Trunk Assessment   Upper Extremity Assessment Upper Extremity Assessment: RUE deficits/detail RUE Deficits / Details: 3+/5 grip strength RUE: Unable to fully assess due to pain    Lower Extremity Assessment Lower Extremity Assessment: Generalized weakness;RLE deficits/detail (R LE gross 4-/5 strength) RLE Deficits / Details: Pt had 3+/5 R knee flex/ ext and 3/5 R hip flexion strength RLE Sensation: WNL       Communication   Communication: No difficulties  Cognition Arousal/Alertness: Awake/alert Behavior During Therapy: WFL for tasks assessed/performed Overall Cognitive Status: Within Functional Limits for tasks assessed                                           General Comments      Exercises Other Exercises Other Exercises: Ankle pumps x 10 bilaterally Other Exercises: LAQ x 10 on the L side only Other Exercises: Seated marching x 5 on ea LE   Assessment/Plan    PT Assessment Patient needs continued PT services  PT Problem List Decreased strength;Decreased activity tolerance;Decreased balance;Decreased mobility       PT Treatment Interventions DME instruction;Gait training;Functional mobility training;Therapeutic exercise;Therapeutic activities;Patient/family education;Wheelchair mobility training    PT Goals (Current goals can be found in the Care Plan section)  Acute Rehab PT Goals Patient Stated Goal: Improve strenght and mobility to improve her ability to transfer and navigate around her home PT Goal Formulation: With patient Time For Goal Achievement: 09/08/21 Potential to Achieve Goals: Fair    Frequency Other (Comment) (QD)   Barriers to discharge Decreased caregiver support Pt husband and primary caregiver recently admitted to hospital. UNsure if she will be able to receive adequate care at home with all day and night coverage.    Co-evaluation     PT goals addressed during session: Mobility/safety with mobility;Proper use of DME;Strengthening/ROM         AM-PAC PT "6 Clicks" Mobility  Outcome Measure  End of Session Equipment Utilized During Treatment: Gait belt Activity Tolerance: Patient tolerated treatment well;Patient limited by pain Patient left: in chair;with chair alarm set;with call bell/phone within reach;with family/visitor present Nurse Communication: Precautions PT Visit Diagnosis: Unsteadiness on feet (R26.81);Other abnormalities of gait and mobility (R26.89);Repeated falls (R29.6);Muscle weakness (generalized) (M62.81);History of falling (Z91.81);Difficulty in walking, not elsewhere classified (R26.2)    Time: 2179-8102      Charges:   PT Evaluation $PT Eval Moderate Complexity: 1 Mod PT Treatments $Therapeutic Exercise: 8-22 mins        Rivka Barbara PT, DPT    Particia Lather PT, DPT 09/22/2021, 11:58 AM

## 2021-09-22 NOTE — Progress Notes (Signed)
Subjective:   Procedure(s) (LRB): INTRAMEDULLARY (IM) NAIL INTERTROCHANTRIC (Left) The patient looks and feels better today she says.  Was out of bed in the chair.  No new findings overnight.  Patient reports pain as mild.  Objective:   VITALS:   Vitals:   09/22/21 1126 09/22/21 1504  BP: (!) 96/54 123/71  Pulse: 75 (!) 47  Resp: 18 16  Temp: 97.8 F (36.6 C) 98.1 F (36.7 C)  SpO2: 97% (!) 87%    Neurologically intact Dorsiflexion/Plantar flexion intact  LABS Recent Labs    09/21/21 0225 09/22/21 0646  HGB 13.1 11.2*  HCT 39.3 34.3*  WBC 12.9* 9.9  PLT 276 240    Recent Labs    09/21/21 0225 09/22/21 0646  NA 134* 129*  K 3.5 3.2*  BUN 33* 42*  CREATININE 7.53* 8.05*  GLUCOSE 125* 219*    Recent Labs    09/21/21 0225  INR 1.0     Assessment/Plan:   Procedure(s) (LRB): INTRAMEDULLARY (IM) NAIL INTERTROCHANTRIC (Left)  Continue advancing PT. May need SNF

## 2021-09-22 NOTE — Progress Notes (Signed)
Inpatient Diabetes Program Recommendations  AACE/ADA: New Consensus Statement on Inpatient Glycemic Control (2015)  Target Ranges:  Prepandial:   less than 140 mg/dL      Peak postprandial:   less than 180 mg/dL (1-2 hours)      Critically ill patients:  140 - 180 mg/dL  Results for DARSHANA, CURNUTT (MRN [id number]) as of 09/22/2021 08:06  Ref. Range 09/21/2021 00:28 09/21/2021 01:33 09/21/2021 03:01 09/21/2021 11:27 09/21/2021 17:21 09/21/2021 20:56  Glucose-Capillary Latest Ref Range: 70 - 99 mg/dL 110 (H) 97 120 (H) 162 (H)  2 units Novolog @ 1331 90 241 (H)  3 units Novolog  Pt Refused NPH dose   Results for SHARIFA, BUCHOLZ (MRN [id number]) as of 09/22/2021 09:33  Ref. Range 09/22/2021 08:19  Glucose-Capillary Latest Ref Range: 70 - 99 mg/dL 201 (H)  3 units Novolog  15 units NPH  Results for ARELLA, BLINDER (MRN [id number]) as of 09/22/2021 08:06  Ref. Range 01/02/2021 13:53 09/21/2021 02:25  Hemoglobin A1C Latest Ref Range: 4.8 - 5.6 % 10.0 (H) 9.7 (H)  (231 mg/dl)    Admit with: Hip Fracture due to Fall/ Humeral Fracture  History: DM, ESRD  Home DM Meds: NPH Insulin 20 units AM/ 35 units PM  Current Orders: NPH Insulin 15 units BID     Novolog Sensitive Correction Scale/ SSI (0-9 units) TID AC + HS    MD- Please note that per review of MAR, pt refused NPH insulin dose last PM at bedtime--CBG elevated to 201 this AM.  Did take NPH and Novolog SSi this AM.  Will follow    --Will follow patient during hospitalization--  Wyn Quaker RN, MSN, CDE Diabetes Coordinator Inpatient Glycemic Control Team Team Pager: [phone] (8a-5p)

## 2021-09-22 NOTE — Evaluation (Signed)
Occupational Therapy Evaluation Patient Details Name: Andrea Stewart MRN: [id number] DOB: 03-01-1946 Today's Date: 09/22/2021   History of Present Illness Andrea Stewart is a 75 y.o. Caucasian female with medical history significant for COPD, depression, hypertension, type 2 diabetes mellitus end-stage renal disease on peritoneal dialysis and obesity, who presented to the emergency room following mechanical fall getting to her BSC and fell on her right side with subsequent right shoulder and hip pain.  Imaging showed a nondisplaced fracture of the lateral humeral head and an undisplaced right femoral intratrochanteric fracture.   Clinical Impression   [name] was seen for OT evaluation this date. Prior to hospital admission, pt was MOD I for mobility and ADLs using 4WW for household mobility and w/c for community mobility. Pt lives with spouse in 1 level home c ramped entrance (husband currently in hospital - pt reports having 4 sons, grandsons, and an aid for IADLs, will see if available 24/7). Pt presents to acute OT demonstrating impaired ADL performance and functional mobility 2/2 decreased activity tolerance, limited use of dominant RUE, and functional strength/ROM/balance deficits.   Pt currently requires MAX A don B socks seated EOB. MAX A don/doff gown seated EOB. MOD A + HW for BSC t/f. MAX A + HW for perihygiene in standing. Pt reports 5/10 R shoulder pain with mobility - RN notified. Pt would benefit from skilled OT to address noted impairments and functional limitations (see below for any additional details) in order to maximize safety and independence while minimizing falls risk and caregiver burden. Upon hospital discharge, recommend STR to maximize pt safety and return to PLOF. Pt adamant about returning home, discussed with pt that she would require 24/7 SUPERVISION from family to return home safely.        Recommendations for follow up therapy are one component of a  multi-disciplinary discharge planning process, led by the attending physician.  Recommendations may be updated based on patient status, additional functional criteria and insurance authorization.   Follow Up Recommendations  Skilled nursing-short term rehab (<3 hours/day)    Assistance Recommended at Discharge Frequent or constant Supervision/Assistance  Functional Status Assessment  Patient has had a recent decline in their functional status and demonstrates the ability to make significant improvements in function in a reasonable and predictable amount of time.  Equipment Recommendations  Other (comment) Photographer)    Recommendations for Other Services       Precautions / Restrictions Precautions Precautions: Fall Required Braces or Orthoses: Sling Restrictions Weight Bearing Restrictions: Yes RUE Weight Bearing: Weight bearing as tolerated RLE Weight Bearing: Weight bearing as tolerated Other Position/Activity Restrictions: RUE sling for comfort      Mobility Bed Mobility Overal bed mobility: Needs Assistance Bed Mobility: Supine to Sit     Supine to sit: Mod assist;HOB elevated     General bed mobility comments: assist for trunk and scooting to EOB    Transfers Overall transfer level: Needs assistance Equipment used: Hemi-walker Transfers: Bed to chair/wheelchair/BSC;Sit to/from Stand Sit to Stand: Mod assist     Step pivot transfers: Min assist          Balance Overall balance assessment: History of Falls;Needs assistance Sitting-balance support: Single extremity supported Sitting balance-Leahy Scale: Good     Standing balance support: Single extremity supported;Reliant on assistive device for balance Standing balance-Leahy Scale: Poor  ADL either performed or assessed with clinical judgement   ADL Overall ADL's : Needs assistance/impaired                                       General ADL  Comments: MAX A don B socks seated EOB. MAX A don/doff gown seated EOB. MOD A + HW for BSC t/f. MAX A + HW for perihygiene in standing.      Pertinent Vitals/Pain Pain Assessment: 0-10 Pain Score: 5  Pain Location: R shoulder Pain Descriptors / Indicators: Discomfort;Dull;Grimacing Pain Intervention(s): Limited activity within patient's tolerance;Repositioned;Patient requesting pain meds-RN notified     Hand Dominance Right   Extremity/Trunk Assessment Upper Extremity Assessment Upper Extremity Assessment: RUE deficits/detail RUE Deficits / Details: 3+/5 grip strength RUE: Unable to fully assess due to pain   Lower Extremity Assessment Lower Extremity Assessment: Generalized weakness RLE Deficits / Details: Pt had 3+/5 R knee flex/ ext and 3/5 R hip flexion strength RLE Sensation: WNL       Communication Communication Communication: No difficulties   Cognition Arousal/Alertness: Awake/alert Behavior During Therapy: WFL for tasks assessed/performed Overall Cognitive Status: Within Functional Limits for tasks assessed                                       General Comments  SpO2 high 90s t/o session on RA (2L [place] doffed at start of session)    Exercises Exercises: Other exercises Other Exercises Other Exercises: Pt educated re: OT role, DME recs, d/c recs, falls prevention, ECS, sling mgmt Other Exercises: LBD, UBD, sup>sit, sit<>stand, toileting, sitting/standing balance/tolerance, SPT x2   Shoulder Instructions      Home Living Family/patient expects to be discharged to:: Private residence Living Arrangements: Spouse/significant other Available Help at Discharge: Other (Comment) (husband currently in hospital, pt reports 4 sons and grandsons, and an aid for IADLs, will see if available 24/7) Type of Home: House Home Access: Ramped entrance     Home Layout: One level         Biochemist, clinical:  (uses bedside commode)     Home Equipment:  Rollator (4 wheels);BSC/3in1;Wheelchair - manual          Prior Functioning/Environment Prior Level of Function : Needs assist       Physical Assist : Mobility (physical) Mobility (physical): Transfers   Mobility Comments: Pt reports she utilizes wheelchair to get in and out of house utilizing ramped entrance. She reports her husband provided assistance with this task prior to his recent fall. 3AS for houshold mobility. Reports 2 falls in 3 months          OT Problem List: Decreased strength;Decreased range of motion;Impaired balance (sitting and/or standing);Decreased activity tolerance;Decreased safety awareness;Decreased knowledge of use of DME or AE;Impaired UE functional use      OT Treatment/Interventions: Self-care/ADL training;Therapeutic exercise;Energy conservation;DME and/or AE instruction;Therapeutic activities;Patient/family education;Balance training    OT Goals(Current goals can be found in the care plan section) Acute Rehab OT Goals Patient Stated Goal: to go home OT Goal Formulation: With patient Time For Goal Achievement: 10/06/21 Potential to Achieve Goals: Good ADL Goals Pt Will Perform Grooming: with set-up;sitting Pt Will Perform Lower Body Dressing: with min assist;sit to/from stand (c LRAD PRN) Pt Will Transfer to Toilet: with min assist;ambulating;bedside commode (c LRAD PRN) Pt Will  Perform Toileting - Clothing Manipulation and hygiene: with min assist;sit to/from stand (c LRAD PRN)  OT Frequency: Min 3X/week   Barriers to D/C:               AM-PAC OT "6 Clicks" Daily Activity     Outcome Measure Help from another person eating meals?: A Little Help from another person taking care of personal grooming?: A Little Help from another person toileting, which includes using toliet, bedpan, or urinal?: A Lot Help from another person bathing (including washing, rinsing, drying)?: A Lot Help from another person to put on and taking off regular upper  body clothing?: A Lot Help from another person to put on and taking off regular lower body clothing?: A Lot 6 Click Score: 14   End of Session Equipment Utilized During Treatment: Gait belt;Other (comment) (HW) Nurse Communication: Mobility status  Activity Tolerance: Patient tolerated treatment well Patient left: in chair;with call bell/phone within reach;with chair alarm set  OT Visit Diagnosis: Other abnormalities of gait and mobility (R26.89);Muscle weakness (generalized) (M62.81)                Time: 7588-3254 OT Time Calculation (min): 52 min Charges:  OT General Charges $OT Visit: 1 Visit OT Evaluation $OT Eval Moderate Complexity: 1 Mod OT Treatments $Self Care/Home Management : 38-52 mins  Dessie Coma, M.S. OTR/L  09/22/21, 1:56 PM  ascom [phone]

## 2021-09-22 NOTE — Progress Notes (Signed)
PROGRESS NOTE    Andrea Stewart  NOB:[id number] DOB: [birth year] DOA: 09/21/2021 PCP: Rusty Aus, MD   Chief Complaint  Patient presents with   Fall    Brief Narrative:  Andrea Stewart is Andrea Stewart 75 y.o. obese Caucasian female with medical history significant for COPD, depression, hypertension, type 2 diabetes mellitus end-stage renal disease on peritoneal dialysis and obesity, who presented to the emergency room with acute onset of mechanical fall when she was getting in her bedroom to her portable toilet seat and stumbled and fell on her right side with subsequent right shoulder and hip pain.  Imaging showed Andrea Stewart nondisplaced fracture of the lateral humeral head and an undisplaced right femoral intratrochanteric fracture.   Assessment & Plan:   Active Problems:   Closed right hip fracture [facility])  Mechanical Fall Right nondisplaced femoral intratrochanteric fracture Right Nondisplaced Fracture of Lateral Humeral Head with involvement of the Greater Tuberosity Ortho c/s, appreciate recs, Dr. Sabra Heck recommending nonoperative management of nondisplaced R hip greater trochanteric fx and sprain of R shoulder with contusion and old fx noted - follow up in 10 days to 2 weeks RUE sling Pain control, scheduled APAP, dilaudid, oxycodone (anaphylaxis with morphine - per discussion with pharmacy, she's had fentanyl, dilaudid, tramadal, norco, oxycodone in past and tolerated) Head CT without acute abnormalities, elbow plain films without abnormality Therapy eval  - family asking about CIR  ESRD on peritoneal dialysis  Hyponatremia Renal c/s, appreciate assistance Lasix, spiron Hyponatremia defer to renal   Hypertension Lasix, spironolactone, metop  T2DM c/b peripheral neuropathy Continue basal (lower dose) and SSI - will add mealtime Lyrica  Dyslipidemia Statin  COPD  inhalers  Anxiety Xanax  DVT prophylaxis: SCD Code Status: full Family Communication: daughter in law at  bedside Disposition:   Status is: Inpatient  Remains inpatient appropriate because: need for ortho eval and possible intervention       Consultants:  Ortho neprho  Procedures:  none  Antimicrobials:  Anti-infectives (From admission, onward)    Start     Dose/Rate Route Frequency Ordered Stop   09/22/21 1330  vancomycin (VANCOCIN) 1,000 mg in dialysis solution 1.5% low-MG/low-CA 3,000 mL dialysis solution         Peritoneal Dialysis Once in dialysis 09/22/21 1238     09/22/21 1315  vancomycin (VANCOCIN) 100 mg/mL injection 1,000 mg  Status:  Discontinued        1,000 mg Intraperitoneal  Once 09/22/21 1218 09/22/21 1236   09/21/21 0600  ceFAZolin (ANCEF) IVPB 1 g/50 mL premix  Status:  Discontinued        1 g 100 mL/hr over 30 Minutes Intravenous On call to O.R. 09/21/21 0221 09/21/21 1129   09/21/21 0600  clindamycin (CLEOCIN) IVPB 600 mg  Status:  Discontinued        600 mg 100 mL/hr over 30 Minutes Intravenous Every 8 hours 09/21/21 0221 09/21/21 1129          Subjective: Continued shoulder pain  Objective: Vitals:   09/21/21 2317 09/22/21 0446 09/22/21 0749 09/22/21 1126  BP: (!) 97/53 99/64 (!) 107/57 (!) 96/54  Pulse: 67 74 72 75  Resp: 20 18 16 18   Temp: 98.2 F (36.8 C) 98.3 F (36.8 C) 97.8 F (36.6 C) 97.8 F (36.6 C)  TempSrc:      SpO2: 100% 100% 100% 97%  Weight:      Height:        Intake/Output Summary (Last 24 hours) at 09/22/2021 1342 Last  data filed at 09/22/2021 1000 Gross per 24 hour  Intake 350 ml  Output --  Net 350 ml   Filed Weights   09/20/21 2036 09/21/21 1706  Weight: 70.8 kg 65.1 kg    Examination: General: No acute distress. Sitting up in chair at side of bed Cardiovascular: RRR Lungs: unlabored Abdomen: Soft, nontender, nondistended  Neurological: Alert and oriented 3. Moves all extremities 4 with equal strength. Cranial nerves II through XII grossly intact. Skin: Warm and dry. No rashes or lesions. Extremities:  R arm in sling  Data Reviewed: I have personally reviewed following labs and imaging studies  CBC: Recent Labs  Lab 09/21/21 0225 09/22/21 0646  WBC 12.9* 9.9  NEUTROABS 10.2*  --   HGB 13.1 11.2*  HCT 39.3 34.3*  MCV 92.0 92.0  PLT 276 132    Basic Metabolic Panel: Recent Labs  Lab 09/21/21 0225 09/22/21 0646  NA 134* 129*  K 3.5 3.2*  CL 94* 92*  CO2 25 25  GLUCOSE 125* 219*  BUN 33* 42*  CREATININE 7.53* 8.05*  CALCIUM 8.9 8.2*    GFR: Estimated Creatinine Clearance: 4.9 mL/min ([name]) (by C-G formula based on SCr of 8.05 mg/dL (H)).  Liver Function Tests: No results for input(s): AST, ALT, ALKPHOS, BILITOT, PROT, ALBUMIN in the last 168 hours.  CBG: Recent Labs  Lab 09/21/21 1127 09/21/21 1721 09/21/21 2056 09/22/21 0819 09/22/21 1147  GLUCAP 162* 90 241* 201* 328*     Recent Results (from the past 240 hour(s))  Resp Panel by RT-PCR (Flu [name]&B, Covid) Nasopharyngeal Swab     Status: None   Collection Time: 09/21/21  2:25 AM   Specimen: Nasopharyngeal Swab; Nasopharyngeal(NP) swabs in vial transport medium  Result Value Ref Range Status   SARS Coronavirus 2 by RT PCR NEGATIVE NEGATIVE Final    Comment: (NOTE) SARS-CoV-2 target nucleic acids are NOT DETECTED.  The SARS-CoV-2 RNA is generally detectable in upper respiratory specimens during the acute phase of infection. The lowest concentration of SARS-CoV-2 viral copies this assay can detect is 138 copies/mL. [name] negative result does not preclude SARS-Cov-2 infection and should not be used as the sole basis for treatment or other patient management decisions. [name] negative result may occur with  improper specimen collection/handling, submission of specimen other than nasopharyngeal swab, presence of viral mutation(s) within the areas targeted by this assay, and inadequate number of viral copies(<138 copies/mL). [name] negative result must be combined with clinical observations, patient history, and  epidemiological information. The expected result is Negative.  Fact Sheet for Patients:  EntrepreneurPulse.com.au  Fact Sheet for Healthcare Providers:  IncredibleEmployment.be  This test is no t yet approved or cleared by the [place] FDA and  has been authorized for detection and/or diagnosis of SARS-CoV-2 by FDA under an Emergency Use Authorization (EUA). This EUA will remain  in effect (meaning this test can be used) for the duration of the COVID-19 declaration under Section 564(b)(1) of the Act, 21 U.S.C.section 360bbb-3(b)(1), unless the authorization is terminated  or revoked sooner.       Influenza Salar [place] by PCR NEGATIVE NEGATIVE Final   Influenza B by PCR NEGATIVE NEGATIVE Final    Comment: (NOTE) The Xpert Xpress SARS-CoV-2/FLU/RSV plus assay is intended as an aid in the diagnosis of influenza from Nasopharyngeal swab specimens and should not be used as Candice Tobey sole basis for treatment. Nasal washings and aspirates are unacceptable for Xpert Xpress SARS-CoV-2/FLU/RSV testing.  Fact Sheet for Patients: EntrepreneurPulse.com.au  Fact Sheet  for Healthcare Providers: IncredibleEmployment.be  This test is not yet approved or cleared by the [place] and has been authorized for detection and/or diagnosis of SARS-CoV-2 by FDA under an Emergency Use Authorization (EUA). This EUA will remain in effect (meaning this test can be used) for the duration of the COVID-19 declaration under Section 564(b)(1) of the Act, 21 U.S.C. section 360bbb-3(b)(1), unless the authorization is terminated or revoked.  Performed at [facility], [address] [place], [place] [address]   Surgical pcr screen     Status: None   Collection Time: 09/21/21 10:15 AM   Specimen: Nasal Mucosa; Nasal Swab  Result Value Ref Range Status   MRSA, PCR NEGATIVE NEGATIVE Final   Staphylococcus aureus NEGATIVE NEGATIVE  Final    Comment: (NOTE) The Xpert SA Assay (FDA approved for NASAL specimens in patients 43 years of age and older), is one component of Wynter Isaacs comprehensive surveillance program. It is not intended to diagnose infection nor to guide or monitor treatment. Performed at [facility], [address], [place],  [address]          Radiology Studies: DG Shoulder Right  Result Date: 09/20/2021 CLINICAL DATA:  Recent fall with shoulder pain, initial encounter EXAM: RIGHT SHOULDER - 2+ VIEW COMPARISON:  03/26/2009 FINDINGS: There are changes consistent with prior healed right proximal humeral fracture. Degenerative changes of the acromioclavicular joint are seen. No acute fracture or dislocation is noted. The underlying bony thorax is within normal limits. Postsurgical changes in the cervical spine are seen. IMPRESSION: Healed prior proximal right humeral fracture. No acute abnormality noted. Electronically Signed   By: Inez Catalina M.D.   On: 09/20/2021 23:30   DG Elbow 2 Views Right  Result Date: 09/21/2021 CLINICAL DATA:  Lost balance on toilet leading to fall. Right elbow pain. EXAM: RIGHT ELBOW - 2 VIEW COMPARISON:  None. FINDINGS: There is no evidence of fracture, dislocation, or joint effusion. Mild degenerative change with spurring. Enthesopathic change about the lateral epicondyle. Soft tissues are unremarkable. Vascular calcifications are seen. IMPRESSION: No fracture or subluxation of the right elbow. Electronically Signed   By: Keith Rake M.D.   On: 09/21/2021 01:56   CT HEAD WO CONTRAST (5MM)  Result Date: 09/21/2021 CLINICAL DATA:  Recent fall with headaches, initial encounter EXAM: CT HEAD WITHOUT CONTRAST TECHNIQUE: Contiguous axial images were obtained from the base of the skull through the vertex without intravenous contrast. COMPARISON:  12/31/2015 FINDINGS: Brain: No evidence of acute infarction, hemorrhage, hydrocephalus, extra-axial collection or mass  lesion/mass effect. Mild atrophic and chronic white matter ischemic changes are noted. Vascular: No hyperdense vessel or unexpected calcification. Skull: Normal. Negative for fracture or focal lesion. Sinuses/Orbits: No acute finding. Other: None. IMPRESSION: Chronic atrophic and ischemic changes without acute abnormality. Electronically Signed   By: Inez Catalina M.D.   On: 09/21/2021 00:25   CT Shoulder Right Wo Contrast  Result Date: 09/21/2021 CLINICAL DATA:  Right shoulder fracture.  Fall. EXAM: CT OF THE UPPER RIGHT EXTREMITY WITHOUT CONTRAST TECHNIQUE: Multidetector CT imaging of the upper right extremity was performed according to the standard protocol. COMPARISON:  Radiograph dated 09/21/2021. FINDINGS: Bones/Joint/Cartilage There is Kaitlan Bin nondisplaced fracture of the lateral humeral head with involvement of the greater tuberosity. Evaluation of the fracture is very limited due to advanced osteopenia. Old healed fracture of the humeral diaphysis noted. There is degenerative changes of the shoulder with spurring. No dislocation. No significant joint effusion. Probable chronic thickening of the joint  capsule. Ligaments Suboptimally assessed by CT. Muscles and Tendons No acute findings.  No intramuscular hematoma. Soft tissues The soft tissues are unremarkable. IMPRESSION: 1. Nondisplaced fracture of the lateral humeral head with involvement of the greater tuberosity. Evaluation of the fracture is very limited due to advanced osteopenia. 2. Old healed fracture of the humeral diaphysis. 3. Degenerative changes of the shoulder with spurring. Electronically Signed   By: Anner Crete M.D.   On: 09/21/2021 03:17   CT Hip Right Wo Contrast  Result Date: 09/21/2021 CLINICAL DATA:  Recent fall with findings suspicious for hip fracture with negative x-rays, initial encounter EXAM: CT OF THE RIGHT HIP WITHOUT CONTRAST TECHNIQUE: Multidetector CT imaging of the right hip was performed according to the standard  protocol. Multiplanar CT image reconstructions were also generated. COMPARISON:  Plain film from earlier in the same day. FINDINGS: Bones/Joint/Cartilage Postsurgical changes are noted in the lower lumbar spine. Degenerative changes of the sacroiliac joints are seen. There are findings consistent with Nichalas Coin right intratrochanteric femoral fracture without significant displacement. No femoral neck fracture is seen. No dislocation is noted. Chronic coccygeal fracture is noted. The remainder of the bony pelvis shows no acute abnormality. Changes of prior ORIF of proximal left femoral fracture are seen. Ligaments Suboptimally assessed by CT. Muscles and Tendons Surrounding musculature appears within normal limits. Soft tissues Peritoneal dialysis catheter is noted deep in the pelvis. Bladder is decompressed. No free fluid is seen. Visualized bowel structures are unremarkable. IMPRESSION: Undisplaced right femoral intratrochanteric fracture. Chronic changes as described above. Electronically Signed   By: Inez Catalina M.D.   On: 09/21/2021 00:32   DG Humerus Right  Result Date: 09/21/2021 CLINICAL DATA:  Lost balance leading to fall.  Right arm pain. EXAM: RIGHT HUMERUS - 2+ VIEW COMPARISON:  Shoulder radiograph yesterday FINDINGS: Possible fracture arising from the lateral humeral head, not definitively seen on prior shoulder exam. Remote healed proximal humeral shaft fracture. No focal soft tissue abnormalities. IMPRESSION: 1. Possible fracture arising from the lateral humeral head. This was not confirmed on concurrent shoulder exam. If there is focal tenderness in this region, consider CT. 2. Remote healed proximal humeral shaft fracture. Electronically Signed   By: Keith Rake M.D.   On: 09/21/2021 01:58   DG Hip Unilat  With Pelvis 2-3 Views Right  Result Date: 09/20/2021 CLINICAL DATA:  Fall injury. EXAM: DG HIP (WITH OR WITHOUT PELVIS) 2-3V RIGHT COMPARISON:  Similar study 03/06/2014 FINDINGS: There is  osteopenia without evidence of displaced fractures. There is mild joint space loss and spurring at the hips, spurring and sclerosis of SI joints and pubic symphysis and enthesopathic changes in the pelvis. There has been an interval left hip nailing which appears well-healed. Externally inserted tubing entering from the right is coiled in the pelvic floor. Again noted are changes of prior L4-5 and L5-S1 discectomy with interbody metallic cylinder grafts. The iliofemoral arteries are heavily calcified. IMPRESSION: Osteopenia, degenerative and postsurgical changes, without evidence of fractures. Progressively heavy vascular calcifications. Additional findings discussed above. Electronically Signed   By: Telford Nab M.D.   On: 09/20/2021 23:32        Scheduled Meds:  (feeding supplement) PROSource Plus  30 mL Oral Daily   acetaminophen  1,000 mg Oral Q8H   ALPRAZolam  0.5 mg Oral QHS   atorvastatin  20 mg Oral Q supper   feeding supplement (NEPRO CARB STEADY)  237 mL Oral Q24H   furosemide  80 mg Oral Daily   gentamicin  cream  1 application Topical Daily   insulin aspart  0-9 Units Subcutaneous TID AC & HS   insulin NPH Human  15 Units Subcutaneous BID AC & HS   metoprolol succinate  25 mg Oral Q breakfast   mometasone-formoterol  2 puff Inhalation BID   multivitamin   Oral Daily   pantoprazole  40 mg Oral BID   spironolactone  25 mg Oral Daily   Vitamin D (Ergocalciferol)  50,000 Units Oral Weekly   Continuous Infusions:  dialysis solution 1.5% low-MG/low-CA     promethazine (PHENERGAN) injection (IM or IVPB)     dianeal solution for CAPD/CCPD with additives       LOS: 1 day    Time spent: over 30 min    Fayrene Helper, MD [facility]   To contact the attending provider between 7A-7P or the covering provider during after hours 7P-7A, please log into the web site [email or web address] and access using universal [place] password for that web site. If you do not have the  password, please call the hospital operator.  09/22/2021, 1:42 PM

## 2021-09-22 NOTE — NC FL2 (Signed)
[place] LEVEL OF CARE SCREENING TOOL     IDENTIFICATION  Patient Name: Andrea Stewart Birthdate: November 25, 1945 Sex: female Admission Date (Current Location): 09/21/2021  [facility] and [place] Number:  Engineering geologist and Address:  [facility], [address], [place], [place] [address]      Provider Number: [id number]  Attending Physician Name and Address:  Elodia Florence., *  Relative Name and Phone Number:  Carloyn Manner [phone]    Current Level of Care: Hospital Recommended Level of Care: [place] Prior Approval Number:    Date Approved/Denied:   PASRR Number: [id number]  Discharge Plan: SNF    Current Diagnoses: Patient Active Problem List   Diagnosis Date Noted   Closed right hip fracture ([place]) 09/21/2021   Fall    Humeral head fracture, right, closed, initial encounter    Abdominal pain 02/24/2021   Hip fracture ([place]) 12/31/2020   Insomnia 09/13/2019   Pruritus 09/13/2019   PAD (peripheral artery disease) ([place]) 05/30/2018   Family history of colon cancer requiring screening colonoscopy 04/06/2018   Incarcerated ventral hernia 09/07/2017   Ventral hernia 09/07/2017   Atherosclerosis of native arteries of extremity with intermittent claudication ([place]) 06/21/2017   Morbid obesity ([place]) 10/21/2016   Edema 06/19/2016   Tobacco abuse counseling 12/17/2015   Tobacco use disorder 12/11/2015   Other specified postprocedural states 08/07/2015   CKD (chronic kidney disease), stage III ([place]) 05/13/2015   COPD (chronic obstructive pulmonary disease) with chronic bronchitis ([place]) 01/21/2015   Abnormal findings on diagnostic imaging of other specified body structures 01/21/2015   Thyroid mass 12/13/2014   Hx of left heart catheterization by cutdown    Single kidney 09/30/2014   Encounter for general adult medical examination without abnormal findings 03/03/2014   Personal history of diseases of the blood and  blood-forming organs and certain disorders involving the immune mechanism 03/03/2014   Pain of right leg 01/28/2014   Irritable bowel syndrome 09/09/2012   Coronary atherosclerosis 07/26/2009   Diabetes mellitus type 2 with complications, uncontrolled 03/09/2007   DEPRESSION 03/09/2007   PERIPHERAL NEUROPATHY 03/09/2007   Essential hypertension 03/09/2007   DEGENERATIVE DISC DISEASE 03/09/2007   BACK PAIN, CHRONIC 03/09/2007   FIBROMYALGIA 03/09/2007   Disorder of bone 03/09/2007   Major depressive disorder, single episode 03/09/2007   Narrowing of intervertebral disc space 03/09/2007   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   History of total knee replacement, right 08/04/2006   History of total knee replacement, left 11/10/2004    Orientation RESPIRATION BLADDER Height & Weight     Self, Time, Situation, Place  Normal Continent Weight: 65.1 kg Height:  4\' 11"  (149.9 cm)  BEHAVIORAL SYMPTOMS/MOOD NEUROLOGICAL BOWEL NUTRITION STATUS      Continent Diet (regular)  AMBULATORY STATUS COMMUNICATION OF NEEDS Skin   Extensive Assist Verbally Normal                       Personal Care Assistance Level of Assistance  Bathing, Feeding, Dressing Bathing Assistance: Limited assistance Feeding assistance: Independent Dressing Assistance: Limited assistance     Functional Limitations Info             SPECIAL CARE FACTORS FREQUENCY  PT (By licensed PT), OT (By licensed OT)     PT Frequency: 5 times per week OT Frequency: 5 times per week            Contractures Contractures Info: Not present  Additional Factors Info  Code Status, Allergies Code Status Info: Full code Allergies Info: Morphine, Prednisone, Gabapentin, Hydralazine, Nsaids, Penicillins           Current Medications (09/22/2021):  This is the current hospital active medication list Current Facility-Administered Medications  Medication Dose Route Frequency Provider Last Rate Last Admin    (feeding supplement) PROSource Plus liquid 30 mL  30 mL Oral Daily Mansy, Jan A, MD       acetaminophen (TYLENOL) tablet 1,000 mg  1,000 mg Oral Q8H Elodia Florence., MD   1,000 mg at 09/22/21 5329   Followed by   Derrill Memo ON 09/24/2021] acetaminophen (TYLENOL) tablet 650 mg  650 mg Oral Q6H PRN Elodia Florence., MD       albuterol (PROVENTIL) (2.5 MG/3ML) 0.083% nebulizer solution 3 mL  3 mL Inhalation Q4H PRN Mansy, Arvella Merles, MD       ALPRAZolam Duanne Moron) tablet 0.25 mg  0.25 mg Oral Once PRN Elodia Florence., MD       ALPRAZolam Duanne Moron) tablet 0.5 mg  0.5 mg Oral QHS Mansy, Jan A, MD   0.5 mg at 09/21/21 2111   atorvastatin (LIPITOR) tablet 20 mg  20 mg Oral Q supper Mansy, Jan A, MD       dialysis solution 1.5% low-MG/low-CA dianeal solution   Intraperitoneal Q24H Colon Flattery, NP       feeding supplement (NEPRO CARB STEADY) liquid 237 mL  237 mL Oral Q24H Mansy, Jan A, MD   237 mL at 09/22/21 0517   furosemide (LASIX) tablet 80 mg  80 mg Oral Daily Mansy, Jan A, MD   80 mg at 09/22/21 9242   gentamicin cream (GARAMYCIN) 0.1 % 1 application  1 application Topical Daily Colon Flattery, NP   1 application at 68/34/19 0928   heparin 1000 unit/ml injection 500 Units  500 Units Intraperitoneal PRN Colon Flattery, NP       HYDROmorphone (DILAUDID) injection 0.5 mg  0.5 mg Intravenous Q2H PRN Elodia Florence., MD   0.5 mg at 09/21/21 1326   hydrOXYzine (ATARAX/VISTARIL) tablet 25 mg  25 mg Oral BID PRN Mansy, Jan A, MD   25 mg at 09/21/21 1329   insulin aspart (novoLOG) injection 0-9 Units  0-9 Units Subcutaneous TID AC & HS Mansy, Jan A, MD   7 Units at 09/22/21 1225   insulin aspart (novoLOG) injection 3 Units  3 Units Subcutaneous TID WC Elodia Florence., MD       insulin NPH Human (NOVOLIN N) injection 20 Units  20 Units Subcutaneous BID AC & HS Elodia Florence., MD       magnesium hydroxide (MILK OF MAGNESIA) suspension 30 mL  30 mL Oral Daily PRN Mansy,  Jan A, MD       metoprolol succinate (TOPROL-XL) 24 hr tablet 25 mg  25 mg Oral Q breakfast Mansy, Jan A, MD   25 mg at 09/22/21 6222   mometasone-formoterol (DULERA) 200-5 MCG/ACT inhaler 2 puff  2 puff Inhalation BID Mansy, Jan A, MD   2 puff at 09/22/21 0928   multivitamin liquid   Oral Daily Mansy, Jan A, MD   15 mL at 09/22/21 0917   nitroGLYCERIN (NITROSTAT) SL tablet 0.4 mg  0.4 mg Sublingual Q5 Min x 3 PRN Mansy, Jan A, MD       ondansetron [facility]) tablet 4 mg  4 mg Oral Q6H PRN Mansy, Arvella Merles, MD  Or   ondansetron (ZOFRAN) injection 4 mg  4 mg Intravenous Q6H PRN Mansy, Jan A, MD   4 mg at 09/21/21 1126   ondansetron (ZOFRAN) tablet 4 mg  4 mg Oral Q6H PRN Mansy, Jan A, MD       oxyCODONE (Oxy IR/ROXICODONE) immediate release tablet 2.5 mg  2.5 mg Oral Q4H PRN Elodia Florence., MD       pantoprazole (PROTONIX) EC tablet 40 mg  40 mg Oral BID Mansy, Jan A, MD   40 mg at 09/22/21 0920   polyethylene glycol (MIRALAX / GLYCOLAX) packet 17 g  17 g Oral Daily PRN Mansy, Jan A, MD       promethazine (PHENERGAN) tablet 12.5 mg  12.5 mg Oral Q6H PRN Elodia Florence., MD       Or   promethazine (PHENERGAN) 6.25 mg in sodium chloride 0.9 % 50 mL IVPB  6.25 mg Intravenous Q6H PRN Elodia Florence., MD       Or   promethazine (PHENERGAN) suppository 12.5 mg  12.5 mg Rectal Q6H PRN Elodia Florence., MD       senna [facility]) tablet 8.6 mg  1 tablet Oral QHS PRN Mansy, Jan A, MD       spironolactone (ALDACTONE) tablet 25 mg  25 mg Oral Daily Mansy, Jan A, MD   25 mg at 09/22/21 4132   traZODone (DESYREL) tablet 25 mg  25 mg Oral QHS PRN Mansy, Arvella Merles, MD       Vitamin D (Ergocalciferol) (DRISDOL) capsule 50,000 Units  50,000 Units Oral Weekly Mansy, Arvella Merles, MD         Discharge Medications: Please see discharge summary for a list of discharge medications.  Relevant Imaging Results:  Relevant Lab Results:   Additional Information SS #: [id number] [place], RN

## 2021-09-22 NOTE — TOC Progression Note (Addendum)
Transition of Care [facility]) - Progression Note    Patient Details  Name: Andrea Stewart MRN: [id number] Date of Birth: 10-14-1946  Transition of Care [facility]) CM/SW [place], RN Phone Number: 09/22/2021, 2:25 PM  Clinical Narrative:   The patient lives at home and is usually independent at home, Her husband is currently in the hospital as a patient,  She does her own Peritoneal Dialysis at home, IF she were to go to [facility] SNF she would have to transition to [place] in to meet with the patient to discuss DC plan and needs, she is having a procedure currently, TOC will return after procedure  I went in after the patient was finished with the procedure and spoke with the patient and her two sons at the bedside, she stated that the only was she would agree to go to [facility] SNF is if she can continue with Peritoneal dialysis and not switch to HD, I called [place] the patient [facility] lioason and followed up, there are no facilities in [place] or locally that offer PC, Only HD, the patient stated that she would also be interested in hiring help at home, I gave her a few names of companies that offer that and explained that I can not make a recommendation, I also explained that insurance does not cover this type of care, she stated understanding  I met with the patient again and her sons in the room, I explained that tre are no facilities that provide PD, only HD, She would have to transition to HD to go to a STR SNF She is agreeable and requested the bedsearch be both in [place] and [place], [place] number obtained [phone] [id number], FL2 completed, [place] sent      Expected Discharge Plan and Services                                                 Social Determinants of Health (SDOH) Interventions    Readmission Risk Interventions No flowsheet data found.

## 2021-09-23 LAB — GLUCOSE, CAPILLARY
Glucose-Capillary: 333 mg/dL — ABNORMAL HIGH (ref 70–99)
Glucose-Capillary: 219 mg/dL — ABNORMAL HIGH (ref 70–99)
Glucose-Capillary: 140 mg/dL — ABNORMAL HIGH (ref 70–99)
Glucose-Capillary: 65 mg/dL — ABNORMAL LOW (ref 70–99)

## 2021-09-23 NOTE — Progress Notes (Signed)
Inpatient Diabetes Program Recommendations  AACE/ADA: New Consensus Statement on Inpatient Glycemic Control (2015)  Target Ranges:  Prepandial:   less than 140 mg/dL      Peak postprandial:   less than 180 mg/dL (1-2 hours)      Critically ill patients:  140 - 180 mg/dL  Results for SHAMONIQUE, BATTISTE (MRN [id number]) as of 09/23/2021 07:10  Ref. Range 09/22/2021 08:19 09/22/2021 11:47 09/22/2021 15:36 09/22/2021 20:28  Glucose-Capillary Latest Ref Range: 70 - 99 mg/dL 201 (H)  3 units Novolog @0928   15 units NPH Insulin @0927  328 (H)  7 units Novolog  293 (H)  REFUSED Novolog insulin 235 (H)  3 units Novolog  REFUSED NPH Insulin  Results for RHEALYN, CULLEN (MRN [id number]) as of 09/23/2021 10:17  Ref. Range 09/23/2021 07:39  Glucose-Capillary Latest Ref Range: 70 - 99 mg/dL 333 (H)    Home DM Meds: NPH Insulin 20 units AM/ 35 units PM   Current Orders: NPH Insulin 20 units BID                           Novolog Sensitive Correction Scale/ SSI (0-9 units) TID AC + HS      Novolog 3 units TID with meals    MD- Please note that patient REFUSED NPH insulin last PM (20 unit dose) and also REFUSED Novolog SSI yesterday at 5pm  CBG 333 this AM likely due to refusal of PM dose of NPH insulin last night     --Will follow patient during hospitalization--  Wyn Quaker RN, MSN, CDE Diabetes Coordinator Inpatient Glycemic Control Team Team Pager: [phone] (8a-5p)

## 2021-09-23 NOTE — Progress Notes (Signed)
Physical Therapy Treatment Patient Details Name: Andrea Stewart MRN: [id number] DOB: [birth year] Today's Date: 09/23/2021   History of Present Illness Andrea Stewart is a 75 y.o. Caucasian female with medical history significant for COPD, depression, hypertension, type 2 diabetes mellitus end-stage renal disease on peritoneal dialysis and obesity, who presented to the emergency room following mechanical fall getting to her BSC and fell on her right side with subsequent right shoulder and hip pain.  Imaging showed a nondisplaced fracture of the lateral humeral head and an undisplaced right femoral intratrochanteric fracture.    PT Comments    Pt arrived to therapy session with nursing present following administration of a sponge bath. Pt was informed of potential for inpatient rehab at [facility] and was very happy to hear this suggestion as she would not require addition surgical procedure to move dialysis line and would also be able to be in closer proximity to her husband. Pt was abel to transition from fowlers position to seated in bed with min A for foot positioning and to prevent LOB. Pt was able to perform Sit to stand transfer with improved efficacy today as compared to previous session and was also able to ambulate for several steps in the room with her distance being limited primarily due to lines preventing further ambulation. Pt has made significant progress since her  PT evaluation yesterday with her function, ambulatory capacity and pain levels. Pt will benefit from skilled physical therapy in the inpatient setting in order to improve her strength, balance, and in order restore her to her PLOF in a safe and time efficient manner.   Recommendations for follow up therapy are one component of a multi-disciplinary discharge planning process, led by the attending physician.  Recommendations may be updated based on patient status, additional functional criteria and insurance  authorization.  Follow Up Recommendations  Acute inpatient rehab (3hours/day)     Assistance Recommended at Discharge Frequent or constant Supervision/Assistance  Equipment Recommendations  Other (comment) (Hemiwalker)    Recommendations for Other Services Rehab consult     Precautions / Restrictions Precautions Precautions: Fall Precaution Comments: WBAT on R LE and R UE, R UE remained in sling for duration of therapy session. Required Braces or Orthoses: Sling Restrictions Weight Bearing Restrictions: Yes RUE Weight Bearing: Weight bearing as tolerated RLE Weight Bearing: Weight bearing as tolerated Other Position/Activity Restrictions: RUE sling for comfort     Mobility  Bed Mobility Overal bed mobility: Needs Assistance Bed Mobility: Supine to Sit     Supine to sit: Min assist;HOB elevated     General bed mobility comments: assist fpr scooting at EOB    Transfers Overall transfer level: Needs assistance Equipment used: Hemi-walker Transfers: Bed to chair/wheelchair/BSC;Sit to/from Stand Sit to Stand: Mod assist     Step pivot transfers: Min assist     General transfer comment: Min A on STS transition, Min A to prevent LOB with transfer    Ambulation/Gait Ambulation/Gait assistance: Min assist Gait Distance (Feet): 8 Feet Assistive device: Hemi-walker Gait Pattern/deviations: Step-to pattern;Decreased step length - right;Decreased step length - left       General Gait Details: Ambulation distance limited due to lines in room abnd pt ambulating farther than PT expected upon standing   Stairs             Wheelchair Mobility    Modified Rankin (Stroke Patients Only)       Balance Overall balance assessment: History of Falls;Needs assistance Sitting-balance support: Single extremity  supported Sitting balance-Leahy Scale: Good     Standing balance support: Single extremity supported;Reliant on assistive device for balance                                 Cognition Arousal/Alertness: Awake/alert Behavior During Therapy: WFL for tasks assessed/performed Overall Cognitive Status: Within Functional Limits for tasks assessed                                          Exercises General Exercises - Lower Extremity Long Arc Quad: Strengthening;15 reps;Right;Seated Hip ABduction/ADduction: Right;Strengthening;10 reps;Seated Straight Leg Raises: 5 reps;Strengthening;Right;Seated Other Exercises Other Exercises: Seated march x 10 on R LE    General Comments        Pertinent Vitals/Pain Pain Assessment: 0-10 Pain Score: 3  Pain Location: R shoulder. R hip Pain Descriptors / Indicators: Dull;Discomfort Pain Intervention(s): Monitored during session    Home Living Family/patient expects to be discharged to:: Private residence Living Arrangements: Spouse/significant other Available Help at Discharge: Other (Comment) (husband currently in hospital, pt reports 4 sons and grandsons, and an aid for IADLs, will see if available 24/7) Type of Home: House Home Access: Ramped entrance       Home Layout: One level Home Equipment: Rollator (4 wheels);BSC/3in1;Wheelchair - manual      Prior Function            PT Goals (current goals can now be found in the care plan section) Acute Rehab PT Goals Patient Stated Goal: Improve strenght and mobility to improve her ability to transfer and navigate around her home PT Goal Formulation: With patient Time For Goal Achievement: 09/08/21 Potential to Achieve Goals: Fair Progress towards PT goals: Progressing toward goals    Frequency    7X/week      PT Plan Discharge plan needs to be updated    Co-evaluation     PT goals addressed during session: Mobility/safety with mobility;Proper use of DME        AM-PAC PT "6 Clicks" Mobility   Outcome Measure  Help needed turning from your back to your side while in a flat bed without using  bedrails?: A Little Help needed moving from lying on your back to sitting on the side of a flat bed without using bedrails?: A Little Help needed moving to and from a bed to a chair (including a wheelchair)?: A Lot Help needed standing up from a chair using your arms (e.g., wheelchair or bedside chair)?: A Lot Help needed to walk in hospital room?: A Lot Help needed climbing 3-5 steps with a railing? : Total 6 Click Score: 13    End of Session Equipment Utilized During Treatment: Gait belt Activity Tolerance: Patient tolerated treatment well;Patient limited by pain Patient left: in chair;with chair alarm set;with call bell/phone within reach Nurse Communication: Precautions PT Visit Diagnosis: Unsteadiness on feet (R26.81);Other abnormalities of gait and mobility (R26.89);Repeated falls (R29.6);Muscle weakness (generalized) (M62.81);History of falling (Z91.81);Difficulty in walking, not elsewhere classified (R26.2)     Time: 7062-3762 PT Time Calculation (min) (ACUTE ONLY): 37 min  Charges:  $Therapeutic Exercise: 8-22 mins $Therapeutic Activity: 8-22 mins                     Rivka Barbara PT, DPT     Particia Lather 09/23/2021, 2:43 PM

## 2021-09-23 NOTE — Progress Notes (Signed)
Subjective:   Procedure(s) (LRB): INTRAMEDULLARY (IM) NAIL INTERTROCHANTRIC (Left) Patient is out of bed in a chair today.  She looks much better.  Says she feels better.  His walked with PT.  She is agreeable to going to a skilled nursing now.  Her husband is at [facility] with a brain bleed and he is going to go to rehab as well with her.  Patient reports pain as mild.  Objective:   VITALS:   Vitals:   09/23/21 0505 09/23/21 0732  BP: (!) 108/48 (!) 109/52  Pulse: (!) 101 96  Resp: 17 16  Temp: 98 F (36.7 C) 98.7 F (37.1 C)  SpO2: 91% 95%    Neurovascular intact Dorsiflexion/Plantar flexion intact  LABS Recent Labs    09/21/21 0225 09/22/21 0646  HGB 13.1 11.2*  HCT 39.3 34.3*  WBC 12.9* 9.9  PLT 276 240    Recent Labs    09/21/21 0225 09/22/21 0646  NA 134* 129*  K 3.5 3.2*  BUN 33* 42*  CREATININE 7.53* 8.05*  GLUCOSE 125* 219*    Recent Labs    09/21/21 0225  INR 1.0     Assessment/Plan:   Procedure(s) (LRB): INTRAMEDULLARY (IM) NAIL INTERTROCHANTRIC (Left)   Up with therapy Discharge to SNF  Return to clinic 2 weeks.  Buffered ASA for DVT prophylaxis if needed.

## 2021-09-23 NOTE — Progress Notes (Signed)
Inpatient Rehabilitation Admissions Coordinator   I spoke with patient by phone to request a time that I can do onsite assessment today of her rehab needs. I have her permission to come between 4 and 430 today to visit. I am also consulting on her spouse at [facility] room 4n32 of his rehab needs.  Danne Baxter, RN, MSN Rehab Admissions Coordinator [phone] 09/23/2021 12:03 PM

## 2021-09-23 NOTE — Progress Notes (Signed)
[facility]  ROUNDING NOTE   Subjective:   Andrea Stewart is a 75 year old Caucasian female with past medical conditions including depression, COPD, hypertension, diabetes, and end-stage renal disease on peritoneal dialysis.  She presents to the emergency room after a fall while going to the bathroom and landing on her right shoulder and right hip.  She is now complaining of right shoulder and hip pain.  Patient will be admitted for Ventral hernia without obstruction or gangrene [K43.9] Closed right hip fracture ([place]) [S72.001A] Fall, initial encounter [W19.XXXA] Closed intertrochanteric fracture of hip, right, initial encounter ([place]) [S72.141A] Humeral head fracture, right, closed, initial encounter [S42.291A]  Patient is known to our office and receives peritoneal dialysis care from Dr. Juleen China.    Patient seen sitting at side of bed, appears comfortable State pain not well managed overnight.  Received PD treatment overnight.  Tolerating meals, denies shortness of breath Continues to worry about husband's condition.  States she feels she may need rehab at discharge.  Objective:  Vital signs in last 24 hours:  Temp:  [97.8 F (36.6 C)-98.7 F (37.1 C)] 98.7 F (37.1 C) (11/09 0732) Pulse Rate:  [47-101] 96 (11/09 0732) Resp:  [16-19] 16 (11/09 0732) BP: (104-123)/(48-87) 109/52 (11/09 0732) SpO2:  [87 %-99 %] 95 % (11/09 0732) Weight:  [82.1 kg] 82.1 kg (11/08 1817)  Weight change: 17 kg Filed Weights   09/20/21 2036 09/21/21 1706 09/22/21 1817  Weight: 70.8 kg 65.1 kg 82.1 kg    Intake/Output: I/O last 3 completed shifts: In: 350 [P.O.:350] Out: -    Intake/Output this shift:  No intake/output data recorded.  Physical Exam: General: NAD, sitting at bedside  Head: Normocephalic, atraumatic. Moist oral mucosal membranes  Eyes: Anicteric  Lungs:  Clear to auscultation, normal effort  Heart: Regular rate and rhythm  Abdomen:  Soft, nontender,  nondistended  Extremities: Trace peripheral edema.  Neurologic: alert, oriented, moving all four extremities  Skin: Generalized abrasions RUE  Access: Peritoneal dialysis catheter    Basic Metabolic Panel: Recent Labs  Lab 09/21/21 0225 09/22/21 0646  NA 134* 129*  K 3.5 3.2*  CL 94* 92*  CO2 25 25  GLUCOSE 125* 219*  BUN 33* 42*  CREATININE 7.53* 8.05*  CALCIUM 8.9 8.2*     Liver Function Tests: No results for input(s): AST, ALT, ALKPHOS, BILITOT, PROT, ALBUMIN in the last 168 hours. No results for input(s): LIPASE, AMYLASE in the last 168 hours. No results for input(s): AMMONIA in the last 168 hours.  CBC: Recent Labs  Lab 09/21/21 0225 09/22/21 0646  WBC 12.9* 9.9  NEUTROABS 10.2*  --   HGB 13.1 11.2*  HCT 39.3 34.3*  MCV 92.0 92.0  PLT 276 240     Cardiac Enzymes: No results for input(s): CKTOTAL, CKMB, CKMBINDEX, TROPONINI in the last 168 hours.  BNP: Invalid input(s): POCBNP  CBG: Recent Labs  Lab 09/22/21 1147 09/22/21 1536 09/22/21 2028 09/23/21 0739 09/23/21 1131  GLUCAP 328* 293* 235* 333* 219*     Microbiology: Results for orders placed or performed during the hospital encounter of 09/21/21  Resp Panel by RT-PCR (Flu A&B, Covid) Nasopharyngeal Swab     Status: None   Collection Time: 09/21/21  2:25 AM   Specimen: Nasopharyngeal Swab; Nasopharyngeal(NP) swabs in vial transport medium  Result Value Ref Range Status   SARS Coronavirus 2 by RT PCR NEGATIVE NEGATIVE Final    Comment: (NOTE) SARS-CoV-2 target nucleic acids are NOT DETECTED.  The SARS-CoV-2 RNA  is generally detectable in upper respiratory specimens during the acute phase of infection. The lowest concentration of SARS-CoV-2 viral copies this assay can detect is 138 copies/mL. A negative result does not preclude SARS-Cov-2 infection and should not be used as the sole basis for treatment or other patient management decisions. A negative result may occur with  improper  specimen collection/handling, submission of specimen other than nasopharyngeal swab, presence of viral mutation(s) within the areas targeted by this assay, and inadequate number of viral copies(<138 copies/mL). A negative result must be combined with clinical observations, patient history, and epidemiological information. The expected result is Negative.  Fact Sheet for Patients:  EntrepreneurPulse.com.au  Fact Sheet for Healthcare Providers:  IncredibleEmployment.be  This test is no t yet approved or cleared by the [place] FDA and  has been authorized for detection and/or diagnosis of SARS-CoV-2 by FDA under an Emergency Use Authorization (EUA). This EUA will remain  in effect (meaning this test can be used) for the duration of the COVID-19 declaration under Section 564(b)(1) of the Act, 21 U.S.C.section 360bbb-3(b)(1), unless the authorization is terminated  or revoked sooner.       Influenza A by PCR NEGATIVE NEGATIVE Final   Influenza B by PCR NEGATIVE NEGATIVE Final    Comment: (NOTE) The Xpert Xpress SARS-CoV-2/FLU/RSV plus assay is intended as an aid in the diagnosis of influenza from Nasopharyngeal swab specimens and should not be used as a sole basis for treatment. Nasal washings and aspirates are unacceptable for Xpert Xpress SARS-CoV-2/FLU/RSV testing.  Fact Sheet for Patients: EntrepreneurPulse.com.au  Fact Sheet for Healthcare Providers: IncredibleEmployment.be  This test is not yet approved or cleared by the [place] FDA and has been authorized for detection and/or diagnosis of SARS-CoV-2 by FDA under an Emergency Use Authorization (EUA). This EUA will remain in effect (meaning this test can be used) for the duration of the COVID-19 declaration under Section 564(b)(1) of the Act, 21 U.S.C. section 360bbb-3(b)(1), unless the authorization is terminated or revoked.  Performed at  [facility], [address], [place], [place] [address]   Surgical pcr screen     Status: None   Collection Time: 09/21/21 10:15 AM   Specimen: Nasal Mucosa; Nasal Swab  Result Value Ref Range Status   MRSA, PCR NEGATIVE NEGATIVE Final   Staphylococcus aureus NEGATIVE NEGATIVE Final    Comment: (NOTE) The Xpert SA Assay (FDA approved for NASAL specimens in patients 52 years of age and older), is one component of a comprehensive surveillance program. It is not intended to diagnose infection nor to guide or monitor treatment. Performed at [facility], [address] [place], [place] [address]     Coagulation Studies: Recent Labs    09/21/21 0225  LABPROT 12.9  INR 1.0     Urinalysis: No results for input(s): COLORURINE, LABSPEC, PHURINE, GLUCOSEU, HGBUR, BILIRUBINUR, KETONESUR, PROTEINUR, UROBILINOGEN, NITRITE, LEUKOCYTESUR in the last 72 hours.  Invalid input(s): APPERANCEUR    Imaging: No results found.   Medications:    dialysis solution 1.5% low-MG/low-CA     promethazine (PHENERGAN) injection (IM or IVPB)      (feeding supplement) PROSource Plus  30 mL Oral Daily   acetaminophen  1,000 mg Oral Q8H   ALPRAZolam  0.5 mg Oral QHS   atorvastatin  20 mg Oral Q supper   feeding supplement (NEPRO CARB STEADY)  237 mL Oral Q24H   furosemide  80 mg Oral Daily   gentamicin cream  1 application Topical Daily  insulin aspart  0-9 Units Subcutaneous TID AC & HS   insulin aspart  3 Units Subcutaneous TID WC   insulin NPH Human  20 Units Subcutaneous BID AC & HS   metoprolol succinate  25 mg Oral Q breakfast   mometasone-formoterol  2 puff Inhalation BID   multivitamin   Oral Daily   pantoprazole  40 mg Oral BID   spironolactone  25 mg Oral Daily   Vitamin D (Ergocalciferol)  50,000 Units Oral Weekly   acetaminophen **FOLLOWED BY** [START ON 09/24/2021] acetaminophen, albuterol, ALPRAZolam, heparin, hydrOXYzine, magnesium hydroxide,  nitroGLYCERIN, ondansetron **OR** ondansetron (ZOFRAN) IV, ondansetron, oxyCODONE, polyethylene glycol, promethazine **OR** promethazine (PHENERGAN) injection (IM or IVPB) **OR** promethazine, senna, traZODone  Assessment/ Plan:  Andrea Stewart is a 75 y.o.  female with past medical conditions including depression, COPD, hypertension, diabetes, and end-stage renal disease on peritoneal dialysis.  Patient has been admitted to [facility] for Ventral hernia without obstruction or gangrene [K43.9] Closed right hip fracture ([place]) [S72.001A] Fall, initial encounter [W19.XXXA] Closed intertrochanteric fracture of hip, right, initial encounter ([place]) [S72.141A] Humeral head fracture, right, closed, initial encounter [S42.291A]  [facility] [address]/73kg CCPD 8 hours 5 exchanges 2082ml fills.    End-stage renal disease on peritoneal dialysis.  Despite reported forms with dialyzer, PD treatment appears to have been successful overnight.  Discussed with patient the possible need to return to rehab at discharge from hospital.  Currently there is no rehab available that we will continue peritoneal dialysis treatments, hence patient will need to transition to temporary in center hemodialysis during her rehab stay.  Patient was not been agreeable to this, but states this may be needed.  With husband in hospital and children not available to help as needed, patient is encouraged to allow [place] to place PermCath to allow for in center dialysis.  Patient request to delay insertion 1 to 2 days.  Anemia of chronic kidney disease Lab Results  Component Value Date   HGB 11.2 (L) 09/22/2021  Iron sucrose outpatient Hemoglobin remains at target  3. Secondary Hyperparathyroidism: with outpatient labs: PTH 3.8, phosphorus 6.0, calcium 8.3 on 08/31/21.   Lab Results  Component Value Date   CALCIUM 8.2 (L) 09/22/2021   CAION 1.13 (L) 07/23/2020   PHOS 4.1 01/06/2021  Receiving vitamin D weekly Will obtain updated lab  work in the a.m.  4. Diabetes mellitus type II with chronic kidney disease: insulin dependent. Home regimen includes NPH and Humulin. Most recent hemoglobin A1c is 9.7 on 09/21/21.  Glucose remains elevated   LOS: 2 Kweli Grassel 11/9/20222:13 PM

## 2021-09-23 NOTE — Progress Notes (Signed)
Inpatient Rehab Admissions Coordinator:   Per therapy recommendations pt was screened for CIR by Shann Medal, PT, DPT.  Discussed with Dr. Ranell Patrick who believes pt does meet criteria for CIR based on need for PD and multiple fractures causing functional decline.  Note consult already received and we will f/u with pt today.   Shann Medal, PT, DPT Admissions Coordinator [phone] 09/23/21  10:53 AM

## 2021-09-23 NOTE — Progress Notes (Signed)
Occupational Therapy Treatment Patient Details Name: Andrea Stewart MRN: [id number] DOB: 1946-08-12 Today's Date: 09/23/2021   History of present illness Andrea Stewart is a 75 y.o. Caucasian female with medical history significant for COPD, depression, hypertension, type 2 diabetes mellitus end-stage renal disease on peritoneal dialysis and obesity, who presented to the emergency room following mechanical fall getting to her BSC and fell on her right side with subsequent right shoulder and hip pain.  Imaging showed a nondisplaced fracture of the lateral humeral head and an undisplaced right femoral intratrochanteric fracture.   OT comments  Pt seen for OT treatment on this date. Upon arrival to room pt seated in chair with son at bedside - son provides encouragement and cues for exercises t/o session. Pt requires MOD A don/doff sling and MIN A don/doff gown seated EOC. SETUP seated grooming using LUE - integrates R hand for opening tooth paste only. Pt tolerated x5 sit<>stand from chair requiring MOD A for 3 trials with RW, 2 trials with HW. Multimodal cues for hand placement to limit R shoulder pain however pt reports 8/10 with mobility. Pt ultimately demonstrated decreased pain using HW. Pt making good progress toward goals. Pt continues to benefit from skilled OT services to maximize return to PLOF and minimize risk of future falls, injury, caregiver burden, and readmission. Will continue to follow POC. Discharge recommendation remains appropriate.     Recommendations for follow up therapy are one component of a multi-disciplinary discharge planning process, led by the attending physician.  Recommendations may be updated based on patient status, additional functional criteria and insurance authorization.    Follow Up Recommendations  Acute inpatient rehab (3hours/day)    Assistance Recommended at Discharge Intermittent Supervision/Assistance  Equipment Recommendations  Other (comment)  (defer to next venue of care)    Recommendations for Other Services      Precautions / Restrictions Precautions Precautions: Fall Required Braces or Orthoses: Sling Restrictions Weight Bearing Restrictions: Yes RUE Weight Bearing: Weight bearing as tolerated RLE Weight Bearing: Weight bearing as tolerated Other Position/Activity Restrictions: RUE sling for comfort       Mobility Bed Mobility Overal bed mobility: Needs Assistance Bed Mobility: Supine to Sit     Supine to sit: Min assist;HOB elevated     General bed mobility comments: pt received and left in chair    Transfers Overall transfer level: Needs assistance Equipment used: Rolling walker (2 wheels);Hemi-walker Transfers: Sit to/from Stand Sit to Stand: Mod assist         General transfer comment: cues for hand placement. 3 trials with RW, 2 trials with HW     Balance Overall balance assessment: History of Falls;Needs assistance Sitting-balance support: No upper extremity supported;Feet supported Sitting balance-Leahy Scale: Good     Standing balance support: Bilateral upper extremity supported;During functional activity Standing balance-Leahy Scale: Fair                             ADL either performed or assessed with clinical judgement   ADL Overall ADL's : Needs assistance/impaired                                       General ADL Comments: MOD A don/doff sling and MIN A don/doff gown seated EOC. SETUP seated grooming using LUE - integrates R hand for opening tooth paste only.  Cognition Arousal/Alertness: Awake/alert Behavior During Therapy: WFL for tasks assessed/performed Overall Cognitive Status: Within Functional Limits for tasks assessed                                            Exercises Exercises: Other exercises General Exercises - Lower Extremity Other Exercises Other Exercises: Pt and family educated re: OT role, DME recs, d/c  recs, falls prevention, ECS, HEP Other Exercises: LBD, UBD, grooming, sit<>stand x5, sitting/standing balance tolerance Other Exercises: standing marches x10 reps. Seated elbow flexion/extension in gravity eliminated position (lap slides) x10 reps.           Pertinent Vitals/ Pain       Pain Assessment: 0-10 Pain Score: 8  Pain Location: R shoulder. R hip Pain Descriptors / Indicators: Dull;Discomfort Pain Intervention(s): Limited activity within patient's tolerance;Repositioned   Frequency  Min 3X/week        Progress Toward Goals  OT Goals(current goals can now be found in the care plan section)  Progress towards OT goals: Progressing toward goals  Acute Rehab OT Goals Patient Stated Goal: to return to PLOF OT Goal Formulation: With patient/family Time For Goal Achievement: 10/06/21 Potential to Achieve Goals: Good ADL Goals Pt Will Perform Grooming: with set-up;sitting Pt Will Perform Lower Body Dressing: with min assist;sit to/from stand Pt Will Transfer to Toilet: with min assist;ambulating;bedside commode Pt Will Perform Toileting - Clothing Manipulation and hygiene: with min assist;sit to/from stand  Plan Discharge plan needs to be updated;Frequency remains appropriate       AM-PAC OT "6 Clicks" Daily Activity     Outcome Measure   Help from another person eating meals?: A Little Help from another person taking care of personal grooming?: A Little Help from another person toileting, which includes using toliet, bedpan, or urinal?: A Lot Help from another person bathing (including washing, rinsing, drying)?: A Lot Help from another person to put on and taking off regular upper body clothing?: A Little Help from another person to put on and taking off regular lower body clothing?: A Lot 6 Click Score: 15    End of Session Equipment Utilized During Treatment: Gait belt;Rolling walker (2 wheels)  OT Visit Diagnosis: Other abnormalities of gait and mobility  (R26.89);Muscle weakness (generalized) (M62.81)   Activity Tolerance Patient tolerated treatment well   Patient Left in chair;with call bell/phone within reach;with chair alarm set;with family/visitor present   Nurse Communication Mobility status        Time: 1442-1530 OT Time Calculation (min): 48 min  Charges: OT General Charges $OT Visit: 1 Visit OT Treatments $Self Care/Home Management : 23-37 mins $Therapeutic Exercise: 8-22 mins  Dessie Coma, M.S. OTR/L  09/23/21, 4:26 PM  ascom [phone]

## 2021-09-23 NOTE — Progress Notes (Signed)
Inpatient Rehabilitation Admissions Coordinator   I met at bedside with patient and spoke with her son, Raquel Sarna via phone. I discussed goals and expectations of a [facility] admit for patient as well as her spouse at [facility] room 4n32. Patient and her spouse assisted each other with her PD. Spouse set up and connected. Patient states she can tell family members how to take over those tasks, but they would also need family training to assist long term. I feel both patient and her spouse could benefit from a [facility] admit pending that family menders or hired caregivers can assist with there care at supervision to min assist level. The couple can not be expected to be independent enough to be each other's caregivers. Raquel Sarna will speak with his other 3 brothers to clarify if they can arrange the caregiver supports needed. Patient has been on hemodialysis once after knee surgery a few years ago at a SNF, but requested to go home after a day and does not want to be on hemodialysis or go to SNF. I will follow up with Shawn and patient tomorrow to clarify family caregiver supports before I will pursue insurance approval with [employer] with Dr Amalia Hailey. CIR admit will not make her independent to return home to be her spouse's caregiver. Please call me with any questions.  Danne Baxter, RN, MSN Rehab Admissions Coordinator [phone] 09/23/2021 4:51 PM .

## 2021-09-23 NOTE — Progress Notes (Signed)
PROGRESS NOTE    Andrea Stewart  SRP:[id number] DOB: 1946/03/22 DOA: 09/21/2021 PCP: Andrea Aus, MD  [id number]/[id number]   Assessment & Plan:   Active Problems:   Closed right hip fracture (HCC)   Andrea Stewart is a 75 y.o. obese Caucasian female with medical history significant for COPD, depression, hypertension, type 2 diabetes mellitus end-stage renal disease on peritoneal dialysis and obesity, who presented to the emergency room with acute onset of mechanical fall when she was getting in her bedroom to her portable toilet seat and stumbled and fell on her right side with subsequent right shoulder and hip pain.  Imaging showed a nondisplaced fracture of the lateral humeral head and an undisplaced right femoral intratrochanteric fracture.   Mechanical Fall Right nondisplaced femoral intratrochanteric fracture Right Nondisplaced Fracture of Lateral Humeral Head with involvement of the Greater Tuberosity Ortho c/s, appreciate recs, Dr. Sabra Heck recommending nonoperative management  --RUE sling --follow up with ortho in clinic in 2 weeks --oral pain regimen   ESRD on peritoneal dialysis  --cont PD while inpatient   Hypertension --cont lasix, spironolactone and metop   T2DM c/b peripheral neuropathy --cont Novolin 20u BID --SSI TID   Dyslipidemia Statin   COPD  inhalers   Anxiety --cont home Xanax nightly   DVT prophylaxis: SCD/Compression stockings Code Status: Full code  Family Communication: son updated at bedside today Level of care: Med-Surg Dispo:   The patient is from: home Anticipated d/c is to: CIR Anticipated d/c date is: whenever bed available Patient currently is medically ready to d/c.   Subjective and Interval History:  Still has pain, but improved.   Objective: Vitals:   09/23/21 1546 09/23/21 1941 09/23/21 2136 09/23/21 2344  BP: (!) 118/49 108/67 91/73 (!) 119/57  Pulse: 70 75 70 83  Resp: 18 20 18 20   Temp: 98.3 F (36.8 C) 98.1 F  (36.7 C) 98.1 F (36.7 C) 98.2 F (36.8 C)  TempSrc:   Oral   SpO2: 97% 92% 100% 97%  Weight:   79.8 kg   Height:        Intake/Output Summary (Last 24 hours) at 09/24/2021 0003 Last data filed at 09/23/2021 1829 Gross per 24 hour  Intake 240 ml  Output --  Net 240 ml   Filed Weights   09/21/21 1706 09/22/21 1817 09/23/21 2136  Weight: 65.1 kg 82.1 kg 79.8 kg    Examination:   Constitutional: NAD, AAOx3 HEENT: conjunctivae and lids normal, EOMI CV: No cyanosis.   RESP: normal respiratory effort, on RA Extremities: No effusions, edema in BLE.  Right arm in sling. SKIN: warm, dry Neuro: II - XII grossly intact.   Psych: Normal mood and affect.  Appropriate judgement and reason   Data Reviewed: I have personally reviewed following labs and imaging studies  CBC: Recent Labs  Lab 09/21/21 0225 09/22/21 0646  WBC 12.9* 9.9  NEUTROABS 10.2*  --   HGB 13.1 11.2*  HCT 39.3 34.3*  MCV 92.0 92.0  PLT 276 244   Basic Metabolic Panel: Recent Labs  Lab 09/21/21 0225 09/22/21 0646  NA 134* 129*  K 3.5 3.2*  CL 94* 92*  CO2 25 25  GLUCOSE 125* 219*  BUN 33* 42*  CREATININE 7.53* 8.05*  CALCIUM 8.9 8.2*   GFR: Estimated Creatinine Clearance: 5.4 mL/min (A) (by C-G formula based on SCr of 8.05 mg/dL (H)). Liver Function Tests: No results for input(s): AST, ALT, ALKPHOS, BILITOT, PROT, ALBUMIN in the last 168 hours. No  results for input(s): LIPASE, AMYLASE in the last 168 hours. No results for input(s): AMMONIA in the last 168 hours. Coagulation Profile: Recent Labs  Lab 09/21/21 0225  INR 1.0   Cardiac Enzymes: No results for input(s): CKTOTAL, CKMB, CKMBINDEX, TROPONINI in the last 168 hours. BNP (last 3 results) No results for input(s): PROBNP in the last 8760 hours. HbA1C: Recent Labs    09/21/21 0225  HGBA1C 9.7*   CBG: Recent Labs  Lab 09/22/21 2028 09/23/21 0739 09/23/21 1131 09/23/21 1637 09/23/21 2121  GLUCAP 235* 333* 219* 140* 65*    Lipid Profile: No results for input(s): CHOL, HDL, LDLCALC, TRIG, CHOLHDL, LDLDIRECT in the last 72 hours. Thyroid Function Tests: No results for input(s): TSH, T4TOTAL, FREET4, T3FREE, THYROIDAB in the last 72 hours. Anemia Panel: No results for input(s): VITAMINB12, FOLATE, FERRITIN, TIBC, IRON, RETICCTPCT in the last 72 hours. Sepsis Labs: No results for input(s): PROCALCITON, LATICACIDVEN in the last 168 hours.  Recent Results (from the past 240 hour(s))  Resp Panel by RT-PCR (Flu A&B, Covid) Nasopharyngeal Swab     Status: None   Collection Time: 09/21/21  2:25 AM   Specimen: Nasopharyngeal Swab; Nasopharyngeal(NP) swabs in vial transport medium  Result Value Ref Range Status   SARS Coronavirus 2 by RT PCR NEGATIVE NEGATIVE Final    Comment: (NOTE) SARS-CoV-2 target nucleic acids are NOT DETECTED.  The SARS-CoV-2 RNA is generally detectable in upper respiratory specimens during the acute phase of infection. The lowest concentration of SARS-CoV-2 viral copies this assay can detect is 138 copies/mL. A negative result does not preclude SARS-Cov-2 infection and should not be used as the sole basis for treatment or other patient management decisions. A negative result may occur with  improper specimen collection/handling, submission of specimen other than nasopharyngeal swab, presence of viral mutation(s) within the areas targeted by this assay, and inadequate number of viral copies(<138 copies/mL). A negative result must be combined with clinical observations, patient history, and epidemiological information. The expected result is Negative.  Fact Sheet for Patients:  EntrepreneurPulse.com.au  Fact Sheet for Healthcare Providers:  IncredibleEmployment.be  This test is no t yet approved or cleared by the [place] FDA and  has been authorized for detection and/or diagnosis of SARS-CoV-2 by FDA under an Emergency Use Authorization  (EUA). This EUA will remain  in effect (meaning this test can be used) for the duration of the COVID-19 declaration under Section 564(b)(1) of the Act, 21 U.S.C.section 360bbb-3(b)(1), unless the authorization is terminated  or revoked sooner.       Influenza A by PCR NEGATIVE NEGATIVE Final   Influenza B by PCR NEGATIVE NEGATIVE Final    Comment: (NOTE) The Xpert Xpress SARS-CoV-2/FLU/RSV plus assay is intended as an aid in the diagnosis of influenza from Nasopharyngeal swab specimens and should not be used as a sole basis for treatment. Nasal washings and aspirates are unacceptable for Xpert Xpress SARS-CoV-2/FLU/RSV testing.  Fact Sheet for Patients: EntrepreneurPulse.com.au  Fact Sheet for Healthcare Providers: IncredibleEmployment.be  This test is not yet approved or cleared by the [place] FDA and has been authorized for detection and/or diagnosis of SARS-CoV-2 by FDA under an Emergency Use Authorization (EUA). This EUA will remain in effect (meaning this test can be used) for the duration of the COVID-19 declaration under Section 564(b)(1) of the Act, 21 U.S.C. section 360bbb-3(b)(1), unless the authorization is terminated or revoked.  Performed at [facility], [address] [place], [place] [address]   Surgical pcr  screen     Status: None   Collection Time: 09/21/21 10:15 AM   Specimen: Nasal Mucosa; Nasal Swab  Result Value Ref Range Status   MRSA, PCR NEGATIVE NEGATIVE Final   Staphylococcus aureus NEGATIVE NEGATIVE Final    Comment: (NOTE) The Xpert SA Assay (FDA approved for NASAL specimens in patients 84 years of age and older), is one component of a comprehensive surveillance program. It is not intended to diagnose infection nor to guide or monitor treatment. Performed at [facility], [address], [place], [place] [address]       Radiology Studies: No results found.   Scheduled  Meds:  (feeding supplement) PROSource Plus  30 mL Oral Daily   acetaminophen  1,000 mg Oral Q8H   ALPRAZolam  0.5 mg Oral QHS   atorvastatin  20 mg Oral Q supper   feeding supplement (NEPRO CARB STEADY)  237 mL Oral Q24H   furosemide  80 mg Oral Daily   gentamicin cream  1 application Topical Daily   insulin aspart  0-9 Units Subcutaneous TID AC & HS   insulin aspart  3 Units Subcutaneous TID WC   insulin NPH Human  20 Units Subcutaneous BID AC & HS   metoprolol succinate  25 mg Oral Q breakfast   mometasone-formoterol  2 puff Inhalation BID   multivitamin   Oral Daily   pantoprazole  40 mg Oral BID   spironolactone  25 mg Oral Daily   Vitamin D (Ergocalciferol)  50,000 Units Oral Weekly   Continuous Infusions:  dialysis solution 1.5% low-MG/low-CA     promethazine (PHENERGAN) injection (IM or IVPB)       LOS: 3 days     Enzo Bi, MD [facility] If 7PM-7AM, please contact night-coverage 09/24/2021, 12:03 AM

## 2021-09-24 LAB — CBC
HCT: 32.8 % — ABNORMAL LOW (ref 36.0–46.0)
Hemoglobin: 11 g/dL — ABNORMAL LOW (ref 12.0–15.0)
MCH: 30.7 pg (ref 26.0–34.0)
MCHC: 33.5 g/dL (ref 30.0–36.0)
MCV: 91.6 fL (ref 80.0–100.0)
Platelets: 241 10*3/uL (ref 150–400)
RBC: 3.58 MIL/uL — ABNORMAL LOW (ref 3.87–5.11)
RDW: 14.1 % (ref 11.5–15.5)
WBC: 7 10*3/uL (ref 4.0–10.5)
nRBC: 0 % (ref 0.0–0.2)

## 2021-09-24 LAB — RENAL FUNCTION PANEL
Albumin: 1.8 g/dL — ABNORMAL LOW (ref 3.5–5.0)
Anion gap: 9 (ref 5–15)
BUN: 44 mg/dL — ABNORMAL HIGH (ref 8–23)
CO2: 25 mmol/L (ref 22–32)
Calcium: 7.7 mg/dL — ABNORMAL LOW (ref 8.9–10.3)
Chloride: 93 mmol/L — ABNORMAL LOW (ref 98–111)
Creatinine, Ser: 8.06 mg/dL — ABNORMAL HIGH (ref 0.44–1.00)
GFR, Estimated: 5 mL/min — ABNORMAL LOW (ref >60)
Glucose, Bld: 226 mg/dL — ABNORMAL HIGH (ref 70–99)
Phosphorus: 7.1 mg/dL — ABNORMAL HIGH (ref 2.5–4.6)
Potassium: 3.3 mmol/L — ABNORMAL LOW (ref 3.5–5.1)
Sodium: 127 mmol/L — ABNORMAL LOW (ref 135–145)

## 2021-09-24 LAB — GLUCOSE, CAPILLARY
Glucose-Capillary: 325 mg/dL — ABNORMAL HIGH (ref 70–99)
Glucose-Capillary: 215 mg/dL — ABNORMAL HIGH (ref 70–99)
Glucose-Capillary: 210 mg/dL — ABNORMAL HIGH (ref 70–99)
Glucose-Capillary: 427 mg/dL — ABNORMAL HIGH (ref 70–99)
Glucose-Capillary: 396 mg/dL — ABNORMAL HIGH (ref 70–99)

## 2021-09-24 MED ORDER — INSULIN NPH (HUMAN) (ISOPHANE) 100 UNIT/ML ~~LOC~~ SUSP
12.0000 [IU] | Freq: Two times a day (BID) | SUBCUTANEOUS | Status: DC
Start: 2021-09-24 — End: 2021-09-27
  Administered 2021-09-24 – 2021-09-27 (×5): 12 [IU] via SUBCUTANEOUS
  Filled 2021-09-24 (×2): qty 10

## 2021-09-24 MED ORDER — ALPRAZOLAM 0.25 MG PO TABS
0.2500 mg | ORAL_TABLET | Freq: Once | ORAL | Status: AC
  Administered 2021-09-24: 0.25 mg via ORAL
  Filled 2021-09-24: qty 1

## 2021-09-24 NOTE — Progress Notes (Signed)
[facility]  ROUNDING NOTE   Subjective:   Andrea Stewart is a 75 year old Caucasian female with past medical conditions including depression, COPD, hypertension, diabetes, and end-stage renal disease on peritoneal dialysis.  She presents to the emergency room after a fall while going to the bathroom and landing on her right shoulder and right hip.  She is now complaining of right shoulder and hip pain.  Patient will be admitted for Ventral hernia without obstruction or gangrene [K43.9] Closed right hip fracture ([place]) [S72.001A] Fall, initial encounter [W19.XXXA] Closed intertrochanteric fracture of hip, right, initial encounter ([place]) [S72.141A] Humeral head fracture, right, closed, initial encounter [S42.291A]  Patient is known to our office and receives peritoneal dialysis care from Dr. Juleen China.    Patient seen lying in bed, complains of right hip pain but just received pain medication from RN.  States PD treatment did not run well overnight.  Continues to be resistant to in the center dialysis treatments while in rehab.  Objective:  Vital signs in last 24 hours:  Temp:  [98 F (36.7 C)-99 F (37.2 C)] 98 F (36.7 C) (11/10 0829) Pulse Rate:  [70-98] 87 (11/10 0835) Resp:  [17-20] 17 (11/10 0829) BP: (91-119)/(49-73) 98/61 (11/10 0835) SpO2:  [92 %-100 %] 99 % (11/10 0829) Weight:  [79.8 kg] 79.8 kg (11/09 2136)  Weight change: -2.3 kg Filed Weights   09/21/21 1706 09/22/21 1817 09/23/21 2136  Weight: 65.1 kg 82.1 kg 79.8 kg    Intake/Output: I/O last 3 completed shifts: In: 240 [P.O.:240] Out: -    Intake/Output this shift:  Total I/O In: 480 [P.O.:480] Out: -   Physical Exam: General: NAD, resting in bed  Head: Normocephalic, atraumatic. Moist oral mucosal membranes  Eyes: Anicteric  Lungs:  Clear to auscultation, normal effort  Heart: Regular rate and rhythm  Abdomen:  Soft, nontender, nondistended  Extremities: Trace peripheral edema.   Neurologic: alert, oriented, moving all four extremities  Skin: Generalized abrasions RUE  Access: Peritoneal dialysis catheter    Basic Metabolic Panel: Recent Labs  Lab 09/21/21 0225 09/22/21 0646 09/24/21 0346  NA 134* 129* 127*  K 3.5 3.2* 3.3*  CL 94* 92* 93*  CO2 25 25 25   GLUCOSE 125* 219* 226*  BUN 33* 42* 44*  CREATININE 7.53* 8.05* 8.06*  CALCIUM 8.9 8.2* 7.7*  PHOS  --   --  7.1*     Liver Function Tests: Recent Labs  Lab 09/24/21 0346  ALBUMIN 1.8*   No results for input(s): LIPASE, AMYLASE in the last 168 hours. No results for input(s): AMMONIA in the last 168 hours.  CBC: Recent Labs  Lab 09/21/21 0225 09/22/21 0646 09/24/21 0346  WBC 12.9* 9.9 7.0  NEUTROABS 10.2*  --   --   HGB 13.1 11.2* 11.0*  HCT 39.3 34.3* 32.8*  MCV 92.0 92.0 91.6  PLT 276 240 241     Cardiac Enzymes: No results for input(s): CKTOTAL, CKMB, CKMBINDEX, TROPONINI in the last 168 hours.  BNP: Invalid input(s): POCBNP  CBG: Recent Labs  Lab 09/23/21 1131 09/23/21 1637 09/23/21 2121 09/24/21 0822 09/24/21 1159  GLUCAP 219* 140* 65* 325* 215*     Microbiology: Results for orders placed or performed during the hospital encounter of 09/21/21  Resp Panel by RT-PCR (Flu A&B, Covid) Nasopharyngeal Swab     Status: None   Collection Time: 09/21/21  2:25 AM   Specimen: Nasopharyngeal Swab; Nasopharyngeal(NP) swabs in vial transport medium  Result Value Ref Range Status  SARS Coronavirus 2 by RT PCR NEGATIVE NEGATIVE Final    Comment: (NOTE) SARS-CoV-2 target nucleic acids are NOT DETECTED.  The SARS-CoV-2 RNA is generally detectable in upper respiratory specimens during the acute phase of infection. The lowest concentration of SARS-CoV-2 viral copies this assay can detect is 138 copies/mL. A negative result does not preclude SARS-Cov-2 infection and should not be used as the sole basis for treatment or other patient management decisions. A negative result may  occur with  improper specimen collection/handling, submission of specimen other than nasopharyngeal swab, presence of viral mutation(s) within the areas targeted by this assay, and inadequate number of viral copies(<138 copies/mL). A negative result must be combined with clinical observations, patient history, and epidemiological information. The expected result is Negative.  Fact Sheet for Patients:  EntrepreneurPulse.com.au  Fact Sheet for Healthcare Providers:  IncredibleEmployment.be  This test is no t yet approved or cleared by the [place] FDA and  has been authorized for detection and/or diagnosis of SARS-CoV-2 by FDA under an Emergency Use Authorization (EUA). This EUA will remain  in effect (meaning this test can be used) for the duration of the COVID-19 declaration under Section 564(b)(1) of the Act, 21 U.S.C.section 360bbb-3(b)(1), unless the authorization is terminated  or revoked sooner.       Influenza A by PCR NEGATIVE NEGATIVE Final   Influenza B by PCR NEGATIVE NEGATIVE Final    Comment: (NOTE) The Xpert Xpress SARS-CoV-2/FLU/RSV plus assay is intended as an aid in the diagnosis of influenza from Nasopharyngeal swab specimens and should not be used as a sole basis for treatment. Nasal washings and aspirates are unacceptable for Xpert Xpress SARS-CoV-2/FLU/RSV testing.  Fact Sheet for Patients: EntrepreneurPulse.com.au  Fact Sheet for Healthcare Providers: IncredibleEmployment.be  This test is not yet approved or cleared by the [place] FDA and has been authorized for detection and/or diagnosis of SARS-CoV-2 by FDA under an Emergency Use Authorization (EUA). This EUA will remain in effect (meaning this test can be used) for the duration of the COVID-19 declaration under Section 564(b)(1) of the Act, 21 U.S.C. section 360bbb-3(b)(1), unless the authorization is terminated  or revoked.  Performed at [facility], [address], [place], [place] [address]   Surgical pcr screen     Status: None   Collection Time: 09/21/21 10:15 AM   Specimen: Nasal Mucosa; Nasal Swab  Result Value Ref Range Status   MRSA, PCR NEGATIVE NEGATIVE Final   Staphylococcus aureus NEGATIVE NEGATIVE Final    Comment: (NOTE) The Xpert SA Assay (FDA approved for NASAL specimens in patients 65 years of age and older), is one component of a comprehensive surveillance program. It is not intended to diagnose infection nor to guide or monitor treatment. Performed at [facility], [address] [place], [place] [address]     Coagulation Studies: No results for input(s): LABPROT, INR in the last 72 hours.   Urinalysis: No results for input(s): COLORURINE, LABSPEC, PHURINE, GLUCOSEU, HGBUR, BILIRUBINUR, KETONESUR, PROTEINUR, UROBILINOGEN, NITRITE, LEUKOCYTESUR in the last 72 hours.  Invalid input(s): APPERANCEUR    Imaging: No results found.   Medications:    dialysis solution 1.5% low-MG/low-CA     promethazine (PHENERGAN) injection (IM or IVPB)      (feeding supplement) PROSource Plus  30 mL Oral Daily   ALPRAZolam  0.5 mg Oral QHS   atorvastatin  20 mg Oral Q supper   feeding supplement (NEPRO CARB STEADY)  237 mL Oral Q24H   furosemide  80 mg Oral Daily   gentamicin cream  1 application Topical Daily   insulin aspart  0-9 Units Subcutaneous TID AC & HS   insulin aspart  3 Units Subcutaneous TID WC   insulin NPH Human  12 Units Subcutaneous BID AC & HS   metoprolol succinate  25 mg Oral Q breakfast   mometasone-formoterol  2 puff Inhalation BID   multivitamin   Oral Daily   pantoprazole  40 mg Oral BID   spironolactone  25 mg Oral Daily   Vitamin D (Ergocalciferol)  50,000 Units Oral Weekly   [EXPIRED] acetaminophen **FOLLOWED BY** acetaminophen, albuterol, heparin, hydrOXYzine, magnesium hydroxide, nitroGLYCERIN, ondansetron **OR**  ondansetron (ZOFRAN) IV, ondansetron, oxyCODONE, polyethylene glycol, promethazine **OR** promethazine (PHENERGAN) injection (IM or IVPB) **OR** promethazine, senna, traZODone  Assessment/ Plan:  [name] is a 75 y.o.  female with past medical conditions including depression, COPD, hypertension, diabetes, and end-stage renal disease on peritoneal dialysis.  Patient has been admitted to [facility] for Ventral hernia without obstruction or gangrene [K43.9] Closed right hip fracture ([place]) [S72.001A] Fall, initial encounter [W19.XXXA] Closed intertrochanteric fracture of hip, right, initial encounter ([place]) [S72.141A] Humeral head fracture, right, closed, initial encounter [S42.291A]  [facility] [address]/73kg CCPD 8 hours 5 exchanges 2039ml fills.    End-stage renal disease on peritoneal dialysis.  PD treatment overnight unsuccessful due to drainage concerns.  We will attempt treatment tonight with different dialyzer.  Patient continues to be hesitant to transitioning to in center dialysis for rehab placement.  Currently being considered for CIR at [facility], which will allow her to remain on PD.  Anemia of chronic kidney disease Lab Results  Component Value Date   HGB 11.0 (L) 09/24/2021  Iron sucrose outpatient Will continue to monitor bone minerals during this admission  3. Secondary Hyperparathyroidism: with outpatient labs: PTH 3.8, phosphorus 6.0, calcium 8.3 on 08/31/21.   Lab Results  Component Value Date   CALCIUM 7.7 (L) 09/24/2021   CAION 1.13 (L) 07/23/2020   PHOS 7.1 (H) 09/24/2021  Receiving vitamin D weekly Calcium and phosphorus not at goal.  We will monitor these labs and consider need for binders.    4. Diabetes mellitus type II with chronic kidney disease: insulin dependent. Home regimen includes NPH and Humulin. Most recent hemoglobin A1c is 9.7 on 09/21/21.  Glucose elevated, sliding scale insulin managed by primary team  5.  Right trochanter hip fracture  with humeral head closed fracture, status post fall.  Not a surgical candidate, medical management.  Rehab versus home.   LOS: [place] 11/10/202212:57 PM

## 2021-09-24 NOTE — Progress Notes (Signed)
Inpatient Diabetes Program Recommendations  AACE/ADA: New Consensus Statement on Inpatient Glycemic Control (2015)  Target Ranges:  Prepandial:   less than 140 mg/dL      Peak postprandial:   less than 180 mg/dL (1-2 hours)      Critically ill patients:  140 - 180 mg/dL   Lab Results  Component Value Date   GLUCAP 325 (H) 09/24/2021   HGBA1C 9.7 (H) 09/21/2021    Review of Glycemic Control Results for Andrea Stewart, Andrea Stewart (MRN [id number]) as of 09/24/2021 09:40  Ref. Range 09/23/2021 07:39 09/23/2021 11:31 09/23/2021 16:37 09/23/2021 21:21 09/24/2021 08:22  Glucose-Capillary Latest Ref Range: 70 - 99 mg/dL 333 (H) 219 (H) 140 (H) 65 (L) 325 (H)   Diabetes history: DM 2 Home DM Meds: NPH Insulin 20 units AM/ 35 units PM   Current Orders: NPH Insulin 20 units BID                           Novolog Sensitive Correction Scale/ SSI (0-9 units) TID AC + HS                            Novolog 3 units TID with meals  Inpatient Diabetes Program Recommendations:   Patient has not been consistently taking NPH insulin- NPH was held last night.  Please reduce NPH to 12 units q AM and NPH 8 units q PM.   Thanks,  Adah Perl, RN, BC-ADM Inpatient Diabetes Coordinator Pager [phone]  (8a-5p)

## 2021-09-24 NOTE — Progress Notes (Addendum)
Occupational Therapy Treatment Patient Details Name: Andrea Stewart MRN: [id number] DOB: 1946/04/15 Today's Date: 09/24/2021   History of present illness Andrea Stewart is a 75 y.o. Caucasian female with medical history significant for COPD, depression, hypertension, type 2 diabetes mellitus end-stage renal disease on peritoneal dialysis and obesity, who presented to the emergency room following mechanical fall getting to her BSC and fell on her right side with subsequent right shoulder and hip pain.  Imaging showed a nondisplaced fracture of the lateral humeral head and an undisplaced right femoral intratrochanteric fracture.   OT comments  Andrea Stewart seen for OT treatment on this date. Upon arrival to room pt awake/alert and working with PT. Pt agreeable to tx. Pt required MOD A to don/doff sling, pt able to assist with neck velcro. Assisted pt to order lunch. Called pt's husband, after which pt became tearful. Extensive therapeutic listening provided and discussed d/c recs at length. Pt performed ther-ex (see exercises below). Pt making good progress toward goals. Pt continues to benefit from skilled OT services to maximize return to PLOF and minimize risk of future falls, injury, caregiver burden, and readmission. Will continue to follow POC. Discharge recommendation remains appropriate.     Recommendations for follow up therapy are one component of a multi-disciplinary discharge planning process, led by the attending physician.  Recommendations may be updated based on patient status, additional functional criteria and insurance authorization.    Follow Up Recommendations  Acute inpatient rehab (3hours/day)    Assistance Recommended at Discharge Intermittent Supervision/Assistance  Equipment Recommendations  Other (comment) (HW, defer to next venue of care)    Recommendations for Other Services      Precautions / Restrictions Precautions Precautions: Fall  Required Braces or  Orthoses: Sling Restrictions Weight Bearing Restrictions: Yes RUE Weight Bearing: Weight bearing as tolerated RLE Weight Bearing: Weight bearing as tolerated Other Position/Activity Restrictions: RUE sling for comfort       Mobility Bed Mobility Overal bed mobility: Needs Assistance Bed Mobility: Supine to Sit          General bed mobility comments: pt received and left in chair    Transfers Overall transfer level: Needs assistance Equipment used: Hemi-walker Transfers: Sit to/from Stand Sit to Stand: Min assist;+2 physical assistance                Balance Overall balance assessment: History of Falls;Needs assistance Sitting-balance support: No upper extremity supported;Feet supported Sitting balance-Leahy Scale: Good     Standing balance support: Single extremity supported;During functional activity;Reliant on assistive device for balance Standing balance-Leahy Scale: Fair                             ADL either performed or assessed with clinical judgement   ADL Overall ADL's : Needs assistance/impaired                                       General ADL Comments: MOD A to don/doff sling, pt able to assist with neck velcro. Assisted pt to order lunch and call husband.    Extremity/Trunk Assessment              Vision       Perception     Praxis      Cognition Arousal/Alertness: Awake/alert Behavior During Therapy: WFL for tasks assessed/performed Overall Cognitive Status: Within Functional Limits  for tasks assessed                                            Exercises Exercises: Other exercises Shoulder Exercises Shoulder Flexion: AAROM;Self ROM;10 reps;Right;Seated Shoulder Extension: AAROM;Self ROM;10 reps;Right;Seated Elbow Flexion: AAROM;Self ROM;10 reps;Right;Seated Elbow Extension: AAROM;Self ROM;10 reps;Right;Seated Hand Exercises Forearm Supination: AROM;Both;10 reps;Seated Forearm  Pronation: AROM;Both;10 reps;Seated Digit Composite Abduction: AROM;Both;10 reps;Seated Digit Composite Adduction: AROM;Both;10 reps;Seated Other Exercises Other Exercises: Pt educ re: role of OT, importance of mvmt for functional mobility, extensive d/c discussion Other Exercises: Pt received in chair with handoff from PT, sit<>stand, shoulder exercises, arm exercises, hand exercises, called dining, called husband, discussed d/c recs extensively   Shoulder Instructions Shoulder Instructions Donning/doffing sling/immobilizer: Moderate assistance     General Comments      Pertinent Vitals/ Pain       Pain Assessment: Faces  Faces Pain Scale: Hurts even more Pain Location: R shoulder, R hip Pain Descriptors / Indicators: Dull;Discomfort Pain Intervention(s): Limited activity within patient's tolerance;Monitored during session;Repositioned  Home Living                                          Prior Functioning/Environment              Frequency  Min 3X/week        Progress Toward Goals  OT Goals(current goals can now be found in the care plan section)  Progress towards OT goals: Progressing toward goals  Acute Rehab OT Goals Patient Stated Goal: to return to PLOF OT Goal Formulation: With patient/family Time For Goal Achievement: 10/06/21 Potential to Achieve Goals: Good ADL Goals Pt Will Perform Grooming: with set-up;sitting Pt Will Perform Lower Body Dressing: with min assist;sit to/from stand Pt Will Transfer to Toilet: with min assist;ambulating;bedside commode Pt Will Perform Toileting - Clothing Manipulation and hygiene: with min assist;sit to/from stand  Plan Discharge plan remains appropriate;Frequency remains appropriate    Co-evaluation                AM-PAC OT "6 Clicks" Daily Activity     Outcome Measure   Help from another person eating meals?: A Little Help from another person taking care of personal grooming?: A  Little Help from another person toileting, which includes using toliet, bedpan, or urinal?: A Lot Help from another person bathing (including washing, rinsing, drying)?: A Lot Help from another person to put on and taking off regular upper body clothing?: A Little Help from another person to put on and taking off regular lower body clothing?: A Lot 6 Click Score: 15    End of Session Equipment Utilized During Treatment: Gait belt (hemiwalker)  OT Visit Diagnosis: Other abnormalities of gait and mobility (R26.89);Muscle weakness (generalized) (M62.81)   Activity Tolerance Patient tolerated treatment well   Patient Left in chair;with call bell/phone within reach;with chair alarm set   Nurse Communication Mobility status        Time: 6803-2122 OT Time Calculation (min): 39 min  Charges: OT General Charges $OT Visit: 1 Visit OT Treatments $Self Care/Home Management : 23-37 mins $Therapeutic Exercise: 8-22 mins  Nino Glow, OTS   Nino Glow 09/24/2021, 1:11 PM

## 2021-09-24 NOTE — Progress Notes (Signed)
Inpatient Rehabilitation Admissions Coordinator   I spoke with pt's son, Raquel Sarna by phone as well as patient. Family can provide the projected caregiver support for the couple after a Cir admit. I will begin Auth with [employer] and I have contacted Dr Amalia Hailey to discuss. I will follow up once insurance determination has been made.  Danne Baxter, RN, MSN Rehab Admissions Coordinator [phone] 09/24/2021 3:47 PM

## 2021-09-24 NOTE — Progress Notes (Signed)
Physical Therapy Treatment Patient Details Name: Andrea Stewart MRN: [id number] DOB: 1945-12-15 Today's Date: 09/24/2021   History of Present Illness Andrea Stewart is a 75 y.o. Caucasian female with medical history significant for COPD, depression, hypertension, type 2 diabetes mellitus end-stage renal disease on peritoneal dialysis and obesity, who presented to the emergency room following mechanical fall getting to her BSC and fell on her right side with subsequent right shoulder and hip pain.  Imaging showed a nondisplaced fracture of the lateral humeral head and an undisplaced right femoral intratrochanteric fracture.    PT Comments    Pt was awake, alert and oriented with family members present upon arrival of PT. Pt felt a little tired from another night of blood sugar issues as well as difficulties with hemodialysis (machine not operating properly/adequately). Pt was able to perform several exercises in supine as well as seated position with improved efficacy this date and was able to complete them at EOB in order to improve trunk control and muscle activation. Pt was able to ambulate a limited distance in room today with min A without significant c/o LE pain, just some fatigue at end of ambulatory bout. Pt making adequate progress at this time with ambulatory tasks and LE strengthening and pt was instructed and agreeable with continuation with PT exercises between sessions. Current recommendation remains to attend inpatient rehab in order to improve her strength, balance, endurance, and function as well as allow adequate management of her end sage chronic kidney disease. Pt is agreeable to this and is in process of setting up familial support system following inpatient stay. Continue o work toward progressing ambulation distanec next session and reinforcing HEP.   Recommendations for follow up therapy are one component of a multi-disciplinary discharge planning process, led by the  attending physician.  Recommendations may be updated based on patient status, additional functional criteria and insurance authorization.  Follow Up Recommendations  Acute inpatient rehab (3hours/day)     Assistance Recommended at Discharge Frequent or constant Supervision/Assistance  Equipment Recommendations  Other (comment) (Hemiwalker)    Recommendations for Other Services Rehab consult     Precautions / Restrictions Precautions Precautions: Fall Precaution Comments: WBAT on R LE and R UE, R UE remained in sling for duration of therapy session. Required Braces or Orthoses: Sling Restrictions Weight Bearing Restrictions: Yes RUE Weight Bearing: Weight bearing as tolerated RLE Weight Bearing: Weight bearing as tolerated Other Position/Activity Restrictions: RUE sling for comfort     Mobility  Bed Mobility Overal bed mobility: Needs Assistance Bed Mobility: Supine to Sit     Supine to sit: Min assist;HOB elevated     General bed mobility comments: pt received and left in chair    Transfers Overall transfer level: Needs assistance Equipment used:  (hemiwalker) Transfers: Sit to/from Stand Sit to Stand: Min assist (from bed)     Step pivot transfers: Min assist          Ambulation/Gait Ambulation/Gait assistance: Min Web designer (Feet): 10 Feet Assistive device: Hemi-walker Gait Pattern/deviations: Step-to pattern;Decreased step length - right;Decreased step length - left       General Gait Details: ambulation distance  limited due to pain and fatigue   Stairs             Wheelchair Mobility    Modified Rankin (Stroke Patients Only)       Balance Overall balance assessment: History of Falls;Needs assistance Sitting-balance support: No upper extremity supported;Feet supported Sitting balance-Leahy Scale: Good  Standing balance support: Bilateral upper extremity supported;During functional activity Standing balance-Leahy  Scale: Fair                              Cognition Arousal/Alertness: Awake/alert Behavior During Therapy: WFL for tasks assessed/performed Overall Cognitive Status: Within Functional Limits for tasks assessed                                          Exercises General Exercises - Lower Extremity Quad Sets: Strengthening;Both;10 reps;Supine Gluteal Sets: Strengthening;Both;10 reps;Supine Long Arc Quad: Both;10 reps;Seated Other Exercises Other Exercises: Ambulation distance 10 feet today with use of minA to prevent LOB and hemiwalker. Pt fatigued by end of ambulatory bout    General Comments        Pertinent Vitals/Pain Pain Score: 0-No pain (resting, 7-8/10 with use of R UE) Pain Location: R shoulder, R hip Pain Descriptors / Indicators: Dull;Discomfort Pain Intervention(s): Limited activity within patient's tolerance;Monitored during session    Home Living                          Prior Function            PT Goals (current goals can now be found in the care plan section) Acute Rehab PT Goals Patient Stated Goal: Improve strenght and mobility to improve her ability to transfer and navigate around her home PT Goal Formulation: With patient Time For Goal Achievement: 09/08/21 Potential to Achieve Goals: Fair    Frequency    7X/week      PT Plan Discharge plan needs to be updated    Co-evaluation     PT goals addressed during session: Mobility/safety with mobility;Balance;Strengthening/ROM        AM-PAC PT "6 Clicks" Mobility   Outcome Measure  Help needed turning from your back to your side while in a flat bed without using bedrails?: A Little Help needed moving from lying on your back to sitting on the side of a flat bed without using bedrails?: A Little Help needed moving to and from a bed to a chair (including a wheelchair)?: A Lot Help needed standing up from a chair using your arms (e.g., wheelchair or  bedside chair)?: A Lot Help needed to walk in hospital room?: A Lot Help needed climbing 3-5 steps with a railing? : Total 6 Click Score: 13    End of Session Equipment Utilized During Treatment: Gait belt Activity Tolerance: Patient tolerated treatment well;Patient limited by pain Patient left: in chair;with chair alarm set;with call bell/phone within reach Nurse Communication: Precautions PT Visit Diagnosis: Unsteadiness on feet (R26.81);Other abnormalities of gait and mobility (R26.89);Repeated falls (R29.6);Muscle weakness (generalized) (M62.81);History of falling (Z91.81);Difficulty in walking, not elsewhere classified (R26.2)     Time: 2620-3559 PT Time Calculation (min) (ACUTE ONLY): 28 min  Charges:  $Therapeutic Exercise: 8-22 mins $Therapeutic Activity: 8-22 mins                     Rivka Barbara PT, DPT     Particia Lather 09/24/2021, 11:32 AM

## 2021-09-24 NOTE — Progress Notes (Signed)
PROGRESS NOTE    Andrea Stewart  BOF:[id number] DOB: Apr 21, 1946 DOA: 09/21/2021 PCP: Rusty Aus, MD  147A/147A-AA   Assessment & Plan:   Active Problems:   Closed right hip fracture (HCC)   Andrea Stewart is a 75 y.o. obese Caucasian female with medical history significant for COPD, depression, hypertension, type 2 diabetes mellitus end-stage renal disease on peritoneal dialysis and obesity, who presented to the emergency room with acute onset of mechanical fall when she was getting in her bedroom to her portable toilet seat and stumbled and fell on her right side with subsequent right shoulder and hip pain.  Imaging showed a nondisplaced fracture of the lateral humeral head and an undisplaced right femoral intratrochanteric fracture.   Mechanical Fall Right nondisplaced femoral intratrochanteric fracture Right Nondisplaced Fracture of Lateral Humeral Head with involvement of the Greater Tuberosity Ortho c/s, appreciate recs, Dr. Sabra Heck recommending nonoperative management  --RUE sling --follow up with ortho in clinic in 2 weeks --oral pain regimen   ESRD on peritoneal dialysis  --cont PD while inpatient   Hypertension --cont lasix, spironolactone and metop   T2DM c/b peripheral neuropathy --had an episode of hypoglycemia --reduce Novlin to 12u BID --SSI TID   Dyslipidemia --cont statin   COPD  --cont daily bronchodilator   Anxiety --cont home Xanax nightly   DVT prophylaxis: SCD/Compression stockings Code Status: Full code  Family Communication:  Level of care: Med-Surg Dispo:   The patient is from: home Anticipated d/c is to: CIR Anticipated d/c date is: whenever bed available Patient currently is medically ready to d/c.   Subjective and Interval History:  Pt reported feeling anxious and upset because her son said something that upset her.     Objective: Vitals:   09/24/21 0514 09/24/21 0829 09/24/21 0835 09/24/21 1559  BP: 110/64 98/62 98/61   117/70  Pulse: 76 98 87 92  Resp: 20 17  15   Temp: 99 F (37.2 C) 98 F (36.7 C)  98.6 F (37 C)  TempSrc:    Oral  SpO2: 92% 99%  97%  Weight:      Height:        Intake/Output Summary (Last 24 hours) at 09/24/2021 1614 Last data filed at 09/24/2021 1406 Gross per 24 hour  Intake 600 ml  Output --  Net 600 ml   Filed Weights   09/21/21 1706 09/22/21 1817 09/23/21 2136  Weight: 65.1 kg 82.1 kg 79.8 kg    Examination:   Constitutional: NAD, AAOx3, sitting up in recliner HEENT: conjunctivae and lids normal, EOMI CV: No cyanosis.   RESP: normal respiratory effort, on RA Extremities: No effusions, edema in BLE SKIN: warm, dry Neuro: II - XII grossly intact.   Psych: depressed mood and affect.     Data Reviewed: I have personally reviewed following labs and imaging studies  CBC: Recent Labs  Lab 09/21/21 0225 09/22/21 0646 09/24/21 0346  WBC 12.9* 9.9 7.0  NEUTROABS 10.2*  --   --   HGB 13.1 11.2* 11.0*  HCT 39.3 34.3* 32.8*  MCV 92.0 92.0 91.6  PLT 276 240 778   Basic Metabolic Panel: Recent Labs  Lab 09/21/21 0225 09/22/21 0646 09/24/21 0346  NA 134* 129* 127*  K 3.5 3.2* 3.3*  CL 94* 92* 93*  CO2 25 25 25   GLUCOSE 125* 219* 226*  BUN 33* 42* 44*  CREATININE 7.53* 8.05* 8.06*  CALCIUM 8.9 8.2* 7.7*  PHOS  --   --  7.1*   GFR:  Estimated Creatinine Clearance: 5.4 mL/min (A) (by C-G formula based on SCr of 8.06 mg/dL (H)). Liver Function Tests: Recent Labs  Lab 09/24/21 0346  ALBUMIN 1.8*   No results for input(s): LIPASE, AMYLASE in the last 168 hours. No results for input(s): AMMONIA in the last 168 hours. Coagulation Profile: Recent Labs  Lab 09/21/21 0225  INR 1.0   Cardiac Enzymes: No results for input(s): CKTOTAL, CKMB, CKMBINDEX, TROPONINI in the last 168 hours. BNP (last 3 results) No results for input(s): PROBNP in the last 8760 hours. HbA1C: No results for input(s): HGBA1C in the last 72 hours.  CBG: Recent Labs  Lab  09/23/21 1131 09/23/21 1637 09/23/21 2121 09/24/21 0822 09/24/21 1159  GLUCAP 219* 140* 65* 325* 215*   Lipid Profile: No results for input(s): CHOL, HDL, LDLCALC, TRIG, CHOLHDL, LDLDIRECT in the last 72 hours. Thyroid Function Tests: No results for input(s): TSH, T4TOTAL, FREET4, T3FREE, THYROIDAB in the last 72 hours. Anemia Panel: No results for input(s): VITAMINB12, FOLATE, FERRITIN, TIBC, IRON, RETICCTPCT in the last 72 hours. Sepsis Labs: No results for input(s): PROCALCITON, LATICACIDVEN in the last 168 hours.  Recent Results (from the past 240 hour(s))  Resp Panel by RT-PCR (Flu A&B, Covid) Nasopharyngeal Swab     Status: None   Collection Time: 09/21/21  2:25 AM   Specimen: Nasopharyngeal Swab; Nasopharyngeal(NP) swabs in vial transport medium  Result Value Ref Range Status   SARS Coronavirus 2 by RT PCR NEGATIVE NEGATIVE Final    Comment: (NOTE) SARS-CoV-2 target nucleic acids are NOT DETECTED.  The SARS-CoV-2 RNA is generally detectable in upper respiratory specimens during the acute phase of infection. The lowest concentration of SARS-CoV-2 viral copies this assay can detect is 138 copies/mL. A negative result does not preclude SARS-Cov-2 infection and should not be used as the sole basis for treatment or other patient management decisions. A negative result may occur with  improper specimen collection/handling, submission of specimen other than nasopharyngeal swab, presence of viral mutation(s) within the areas targeted by this assay, and inadequate number of viral copies(<138 copies/mL). A negative result must be combined with clinical observations, patient history, and epidemiological information. The expected result is Negative.  Fact Sheet for Patients:  EntrepreneurPulse.com.au  Fact Sheet for Healthcare Providers:  IncredibleEmployment.be  This test is no t yet approved or cleared by the [place] FDA and  has  been authorized for detection and/or diagnosis of SARS-CoV-2 by FDA under an Emergency Use Authorization (EUA). This EUA will remain  in effect (meaning this test can be used) for the duration of the COVID-19 declaration under Section 564(b)(1) of the Act, 21 U.S.C.section 360bbb-3(b)(1), unless the authorization is terminated  or revoked sooner.       Influenza A by PCR NEGATIVE NEGATIVE Final   Influenza B by PCR NEGATIVE NEGATIVE Final    Comment: (NOTE) The Xpert Xpress SARS-CoV-2/FLU/RSV plus assay is intended as an aid in the diagnosis of influenza from Nasopharyngeal swab specimens and should not be used as a sole basis for treatment. Nasal washings and aspirates are unacceptable for Xpert Xpress SARS-CoV-2/FLU/RSV testing.  Fact Sheet for Patients: EntrepreneurPulse.com.au  Fact Sheet for Healthcare Providers: IncredibleEmployment.be  This test is not yet approved or cleared by the [place] FDA and has been authorized for detection and/or diagnosis of SARS-CoV-2 by FDA under an Emergency Use Authorization (EUA). This EUA will remain in effect (meaning this test can be used) for the duration of the COVID-19 declaration under Section 564(b)(1) of  the Act, 21 U.S.C. section 360bbb-3(b)(1), unless the authorization is terminated or revoked.  Performed at [facility], [address] [place], [place] [address]   Surgical pcr screen     Status: None   Collection Time: 09/21/21 10:15 AM   Specimen: Nasal Mucosa; Nasal Swab  Result Value Ref Range Status   MRSA, PCR NEGATIVE NEGATIVE Final   Staphylococcus aureus NEGATIVE NEGATIVE Final    Comment: (NOTE) The Xpert SA Assay (FDA approved for NASAL specimens in patients 91 years of age and older), is one component of a comprehensive surveillance program. It is not intended to diagnose infection nor to guide or monitor treatment. Performed at [facility], [address], [place], [place] [address]       Radiology Studies: No results found.   Scheduled Meds:  (feeding supplement) PROSource Plus  30 mL Oral Daily   ALPRAZolam  0.5 mg Oral QHS   atorvastatin  20 mg Oral Q supper   feeding supplement (NEPRO CARB STEADY)  237 mL Oral Q24H   furosemide  80 mg Oral Daily   gentamicin cream  1 application Topical Daily   insulin aspart  0-9 Units Subcutaneous TID AC & HS   insulin aspart  3 Units Subcutaneous TID WC   insulin NPH Human  12 Units Subcutaneous BID AC & HS   metoprolol succinate  25 mg Oral Q breakfast   mometasone-formoterol  2 puff Inhalation BID   multivitamin   Oral Daily   pantoprazole  40 mg Oral BID   spironolactone  25 mg Oral Daily   Vitamin D (Ergocalciferol)  50,000 Units Oral Weekly   Continuous Infusions:  dialysis solution 1.5% low-MG/low-CA     promethazine (PHENERGAN) injection (IM or IVPB)       LOS: 3 days     Enzo Bi, MD [facility] If 7PM-7AM, please contact night-coverage 09/24/2021, 4:14 PM

## 2021-09-25 LAB — GLUCOSE, CAPILLARY
Glucose-Capillary: 287 mg/dL — ABNORMAL HIGH (ref 70–99)
Glucose-Capillary: 239 mg/dL — ABNORMAL HIGH (ref 70–99)
Glucose-Capillary: 208 mg/dL — ABNORMAL HIGH (ref 70–99)
Glucose-Capillary: 79 mg/dL (ref 70–99)

## 2021-09-25 MED ORDER — HEPARIN SODIUM (PORCINE) 1000 UNIT/ML IJ SOLN
INTRAMUSCULAR | Status: AC
  Filled 2021-09-25: qty 1

## 2021-09-25 MED ORDER — INSULIN ASPART 100 UNIT/ML IJ SOLN
6.0000 [IU] | Freq: Three times a day (TID) | INTRAMUSCULAR | Status: DC
Start: 2021-09-25 — End: 2021-09-27
  Administered 2021-09-25 – 2021-09-26 (×3): 6 [IU] via SUBCUTANEOUS
  Filled 2021-09-25 (×4): qty 1

## 2021-09-25 MED ORDER — INSULIN ASPART 100 UNIT/ML IJ SOLN
0.0000 [IU] | Freq: Three times a day (TID) | INTRAMUSCULAR | Status: DC
  Administered 2021-09-25: 3 [IU] via SUBCUTANEOUS
  Administered 2021-09-26: 5 [IU] via SUBCUTANEOUS
  Administered 2021-09-26: 7 [IU] via SUBCUTANEOUS
  Administered 2021-09-27: 3 [IU] via SUBCUTANEOUS
  Administered 2021-09-27: 2 [IU] via SUBCUTANEOUS
  Administered 2021-09-28: 5 [IU] via SUBCUTANEOUS
  Administered 2021-09-28: 7 [IU] via SUBCUTANEOUS
  Administered 2021-09-28 – 2021-09-30 (×5): 3 [IU] via SUBCUTANEOUS
  Administered 2021-09-30: 7 [IU] via SUBCUTANEOUS
  Filled 2021-09-25 (×14): qty 1

## 2021-09-25 NOTE — Progress Notes (Signed)
PROGRESS NOTE    WINNONA Stewart  JKK:[id number] DOB: Jun 26, 1946 DOA: 09/21/2021 PCP: Rusty Aus, MD  147A/147A-AA   Assessment & Plan:   Active Problems:   Closed right hip fracture (HCC)   Andrea Stewart is a 75 y.o. obese Caucasian female with medical history significant for COPD, depression, hypertension, type 2 diabetes mellitus end-stage renal disease on peritoneal dialysis and obesity, who presented to the emergency room with acute onset of mechanical fall when she was getting in her bedroom to her portable toilet seat and stumbled and fell on her right side with subsequent right shoulder and hip pain.  Imaging showed a nondisplaced fracture of the lateral humeral head and an undisplaced right femoral intratrochanteric fracture.   Mechanical Fall Right nondisplaced femoral intratrochanteric fracture Right Nondisplaced Fracture of Lateral Humeral Head with involvement of the Greater Tuberosity Ortho c/s, appreciate recs, Dr. Sabra Heck recommending nonoperative management  --RUE sling --follow up with ortho in clinic in 2 weeks --oral pain regimen   ESRD on peritoneal dialysis  --cont PD while inpatient   Hypertension --cont lasix, spironolactone and metop   T2DM c/b peripheral neuropathy --had an episode of hypoglycemia --cont Novolin 12u BID --SSI TID   Dyslipidemia --cont statin   COPD  --cont daily bronchodilator   Anxiety --cont home Xanax nightly   DVT prophylaxis: SCD/Compression stockings Code Status: Full code  Family Communication:  Level of care: Med-Surg Dispo:   The patient is from: home Anticipated d/c is to: CIR Anticipated d/c date is: whenever bed available Patient currently is medically ready to d/c.   Subjective and Interval History:  Normal oral intake and having daily BM's.    Pt accepted to CIR.   Objective: Vitals:   09/25/21 0448 09/25/21 0750 09/25/21 1144 09/25/21 1541  BP: 106/61 111/64 (!) 142/70 117/82  Pulse:  71 83 73 89  Resp: 17 17 17 16   Temp: 97.9 F (36.6 C) 97.6 F (36.4 C) 97.8 F (36.6 C) 97.9 F (36.6 C)  TempSrc:   Oral Oral  SpO2: 93% 96% 99% 94%  Weight:      Height:        Intake/Output Summary (Last 24 hours) at 09/25/2021 1734 Last data filed at 09/25/2021 1328 Gross per 24 hour  Intake 120 ml  Output --  Net 120 ml   Filed Weights   09/21/21 1706 09/22/21 1817 09/23/21 2136  Weight: 65.1 kg 82.1 kg 79.8 kg    Examination:   Constitutional: NAD, AAOx3 HEENT: conjunctivae and lids normal, EOMI CV: No cyanosis.   RESP: normal respiratory effort, on RA Extremities: No effusions, edema in BLE SKIN: warm, dry Neuro: II - XII grossly intact.   Psych: better mood and affect today.  Appropriate judgement and reason    Data Reviewed: I have personally reviewed following labs and imaging studies  CBC: Recent Labs  Lab 09/21/21 0225 09/22/21 0646 09/24/21 0346  WBC 12.9* 9.9 7.0  NEUTROABS 10.2*  --   --   HGB 13.1 11.2* 11.0*  HCT 39.3 34.3* 32.8*  MCV 92.0 92.0 91.6  PLT 276 240 716   Basic Metabolic Panel: Recent Labs  Lab 09/21/21 0225 09/22/21 0646 09/24/21 0346  NA 134* 129* 127*  K 3.5 3.2* 3.3*  CL 94* 92* 93*  CO2 25 25 25   GLUCOSE 125* 219* 226*  BUN 33* 42* 44*  CREATININE 7.53* 8.05* 8.06*  CALCIUM 8.9 8.2* 7.7*  PHOS  --   --  7.1*  GFR: Estimated Creatinine Clearance: 5.4 mL/min (A) (by C-G formula based on SCr of 8.06 mg/dL (H)). Liver Function Tests: Recent Labs  Lab 09/24/21 0346  ALBUMIN 1.8*   No results for input(s): LIPASE, AMYLASE in the last 168 hours. No results for input(s): AMMONIA in the last 168 hours. Coagulation Profile: Recent Labs  Lab 09/21/21 0225  INR 1.0   Cardiac Enzymes: No results for input(s): CKTOTAL, CKMB, CKMBINDEX, TROPONINI in the last 168 hours. BNP (last 3 results) No results for input(s): PROBNP in the last 8760 hours. HbA1C: No results for input(s): HGBA1C in the last 72  hours.  CBG: Recent Labs  Lab 09/24/21 2016 09/24/21 2143 09/25/21 0752 09/25/21 1133 09/25/21 1638  GLUCAP 427* 396* 287* 239* 208*   Lipid Profile: No results for input(s): CHOL, HDL, LDLCALC, TRIG, CHOLHDL, LDLDIRECT in the last 72 hours. Thyroid Function Tests: No results for input(s): TSH, T4TOTAL, FREET4, T3FREE, THYROIDAB in the last 72 hours. Anemia Panel: No results for input(s): VITAMINB12, FOLATE, FERRITIN, TIBC, IRON, RETICCTPCT in the last 72 hours. Sepsis Labs: No results for input(s): PROCALCITON, LATICACIDVEN in the last 168 hours.  Recent Results (from the past 240 hour(s))  Resp Panel by RT-PCR (Flu A&B, Covid) Nasopharyngeal Swab     Status: None   Collection Time: 09/21/21  2:25 AM   Specimen: Nasopharyngeal Swab; Nasopharyngeal(NP) swabs in vial transport medium  Result Value Ref Range Status   SARS Coronavirus 2 by RT PCR NEGATIVE NEGATIVE Final    Comment: (NOTE) SARS-CoV-2 target nucleic acids are NOT DETECTED.  The SARS-CoV-2 RNA is generally detectable in upper respiratory specimens during the acute phase of infection. The lowest concentration of SARS-CoV-2 viral copies this assay can detect is 138 copies/mL. A negative result does not preclude SARS-Cov-2 infection and should not be used as the sole basis for treatment or other patient management decisions. A negative result may occur with  improper specimen collection/handling, submission of specimen other than nasopharyngeal swab, presence of viral mutation(s) within the areas targeted by this assay, and inadequate number of viral copies(<138 copies/mL). A negative result must be combined with clinical observations, patient history, and epidemiological information. The expected result is Negative.  Fact Sheet for Patients:  EntrepreneurPulse.com.au  Fact Sheet for Healthcare Providers:  IncredibleEmployment.be  This test is no t yet approved or cleared by  the [place] FDA and  has been authorized for detection and/or diagnosis of SARS-CoV-2 by FDA under an Emergency Use Authorization (EUA). This EUA will remain  in effect (meaning this test can be used) for the duration of the COVID-19 declaration under Section 564(b)(1) of the Act, 21 U.S.C.section 360bbb-3(b)(1), unless the authorization is terminated  or revoked sooner.       Influenza A by PCR NEGATIVE NEGATIVE Final   Influenza B by PCR NEGATIVE NEGATIVE Final    Comment: (NOTE) The Xpert Xpress SARS-CoV-2/FLU/RSV plus assay is intended as an aid in the diagnosis of influenza from Nasopharyngeal swab specimens and should not be used as a sole basis for treatment. Nasal washings and aspirates are unacceptable for Xpert Xpress SARS-CoV-2/FLU/RSV testing.  Fact Sheet for Patients: EntrepreneurPulse.com.au  Fact Sheet for Healthcare Providers: IncredibleEmployment.be  This test is not yet approved or cleared by the [place] FDA and has been authorized for detection and/or diagnosis of SARS-CoV-2 by FDA under an Emergency Use Authorization (EUA). This EUA will remain in effect (meaning this test can be used) for the duration of the COVID-19 declaration under Section 564(b)(1)  of the Act, 21 U.S.C. section 360bbb-3(b)(1), unless the authorization is terminated or revoked.  Performed at [facility], [address], [place], [place] [address]   Surgical pcr screen     Status: None   Collection Time: 09/21/21 10:15 AM   Specimen: Nasal Mucosa; Nasal Swab  Result Value Ref Range Status   MRSA, PCR NEGATIVE NEGATIVE Final   Staphylococcus aureus NEGATIVE NEGATIVE Final    Comment: (NOTE) The Xpert SA Assay (FDA approved for NASAL specimens in patients 40 years of age and older), is one component of a comprehensive surveillance program. It is not intended to diagnose infection nor to guide or monitor treatment. Performed  at [facility] [address] [place], [place] [address]       Radiology Studies: No results found.   Scheduled Meds:  (feeding supplement) PROSource Plus  30 mL Oral Daily   ALPRAZolam  0.5 mg Oral QHS   atorvastatin  20 mg Oral Q supper   feeding supplement (NEPRO CARB STEADY)  237 mL Oral Q24H   furosemide  80 mg Oral Daily   gentamicin cream  1 application Topical Daily   insulin aspart  0-9 Units Subcutaneous TID WC   insulin aspart  6 Units Subcutaneous TID WC   insulin NPH Human  12 Units Subcutaneous BID AC & HS   metoprolol succinate  25 mg Oral Q breakfast   mometasone-formoterol  2 puff Inhalation BID   multivitamin   Oral Daily   pantoprazole  40 mg Oral BID   spironolactone  25 mg Oral Daily   Vitamin D (Ergocalciferol)  50,000 Units Oral Weekly   Continuous Infusions:  dialysis solution 1.5% low-MG/low-CA     promethazine (PHENERGAN) injection (IM or IVPB)       LOS: 4 days     Enzo Bi, MD [facility] If 7PM-7AM, please contact night-coverage 09/25/2021, 5:34 PM

## 2021-09-25 NOTE — Evaluation (Signed)
Occupational Therapy Evaluation Patient Details Name: Andrea Stewart MRN: [id number] DOB: [birth year] Today's Date: 09/25/2021   History of Present Illness Andrea Stewart is a 75 y.o. Caucasian female with medical history significant for COPD, depression, hypertension, type 2 diabetes mellitus end-stage renal disease on peritoneal dialysis and obesity, who presented to the emergency room following mechanical fall getting to her BSC and fell on her right side with subsequent right shoulder and hip pain.  Imaging showed a nondisplaced fracture of the lateral humeral head and an undisplaced right femoral intratrochanteric fracture.   Clinical Impression   Andrea Stewart was seen for OT treatment on this date. Upon arrival to room pt in bed, RN at bedside, pt requesting assist with breakfast. MIN A exit R side of bed. SETUP self-feeding finger food using nondominant LUE only - pt unable to use utensils. Dialysis RN in to complete bedside procedure, requesting return later for therapy. Returned afternoon after pt completed bath. Pt requires MOD A don/doff sweateshirt and sweatpants - pt assists pulling over rear in standing. MOD A + HW fsit<>stand and bed>chair SPT. Pt making good progress toward goals. Pt continues to benefit from skilled OT services to maximize return to PLOF and minimize risk of future falls, injury, caregiver burden, and readmission. Will continue to follow POC. Discharge recommendation remains appropriate.        Recommendations for follow up therapy are one component of a multi-disciplinary discharge planning process, led by the attending physician.  Recommendations may be updated based on patient status, additional functional criteria and insurance authorization.   Follow Up Recommendations  Acute inpatient rehab (3hours/day)    Assistance Recommended at Discharge Intermittent Supervision/Assistance  Functional Status Assessment     Equipment Recommendations  Other  (comment) (hemiwalker)    Recommendations for Other Services       Precautions / Restrictions Precautions Precautions: Fall Restrictions Weight Bearing Restrictions: Yes RUE Weight Bearing: Weight bearing as tolerated RLE Weight Bearing: Weight bearing as tolerated Other Position/Activity Restrictions: RUE sling for comfort      Mobility Bed Mobility Overal bed mobility: Needs Assistance Bed Mobility: Supine to Sit     Supine to sit: Min assist;HOB elevated     General bed mobility comments: assist for trunk    Transfers Overall transfer level: Needs assistance Equipment used: Rolling walker (2 wheels) Transfers: Sit to/from Stand;Bed to chair/wheelchair/BSC Sit to Stand: Mod assist     Step pivot transfers: Mod assist     General transfer comment: assist for lift off and cues for upright posture      Balance Overall balance assessment: Needs assistance Sitting-balance support: No upper extremity supported;Feet supported Sitting balance-Leahy Scale: Good     Standing balance support: During functional activity;No upper extremity supported Standing balance-Leahy Scale: Fair Standing balance comment: CGA static standing                           ADL either performed or assessed with clinical judgement   ADL Overall ADL's : Needs assistance/impaired                                       General ADL Comments: SETUP self-feeding finger food using nondominant LUE only - pt unable to use utensils. MOD A don/doff sweateshirt and sweatpants - pt assists pulling over rear in standing. MOD A + HW for ADL  t/f      Pertinent Vitals/Pain Pain Assessment: Faces Faces Pain Scale: Hurts little more Pain Location: R shoulder, R hip Pain Descriptors / Indicators: Dull;Discomfort Pain Intervention(s): Limited activity within patient's tolerance              Cognition Arousal/Alertness: Awake/alert Behavior During Therapy: WFL for tasks  assessed/performed Overall Cognitive Status: Within Functional Limits for tasks assessed                                       General Comments       Exercises Exercises: Other exercisesOther Exercises Other Exercises: Pt educ re: HEP, importance of mvmt for functional mobility, extensive d/c discussion Other Exercises: LBD, UBD, grooming, sup>sit, self-feeding, sit<>stand, SPT, sitting./standing balance/tolerance   Shoulder Instructions      Home Living     Available Help at Discharge: Family;Available 24 hours/day (children and their families to provide 24/7 assist of the couple after CIR)               Bathroom Shower/Tub: Walk-in Psychologist, prison and probation services: Handicapped height Bathroom Accessibility: Yes How Accessible: Accessible via walker     Additional Comments: Patietn on home peritoneal dialysis. Her spouse is her trained partner and does most of the setup but he is also a CIR patient at the same time as she  Lives With: Spouse     OT Goals(Current goals can be found in the care plan section) Acute Rehab OT Goals Patient Stated Goal: to go to [facility] OT Goal Formulation: With patient/family Time For Goal Achievement: 10/06/21 Potential to Achieve Goals: Good ADL Goals Pt Will Perform Grooming: with set-up;sitting Pt Will Perform Lower Body Dressing: with min assist;sit to/from stand Pt Will Transfer to Toilet: with min assist;ambulating;bedside commode Pt Will Perform Toileting - Clothing Manipulation and hygiene: with min assist;sit to/from stand  OT Frequency: Min 3X/week    AM-PAC OT "6 Clicks" Daily Activity     Outcome Measure Help from another person eating meals?: A Little Help from another person taking care of personal grooming?: A Little Help from another person toileting, which includes using toliet, bedpan, or urinal?: A Lot Help from another person bathing (including washing, rinsing, drying)?: A Lot Help from another person to  put on and taking off regular upper body clothing?: A Lot Help from another person to put on and taking off regular lower body clothing?: A Lot 6 Click Score: 14   End of Session    Activity Tolerance: Patient tolerated treatment well Patient left: in chair;with call bell/phone within reach  OT Visit Diagnosis: Other abnormalities of gait and mobility (R26.89);Muscle weakness (generalized) (M62.81)                Time: 0630-1601 ((939-949)) OT Time Calculation (min): 28 min Charges:  OT General Charges $OT Visit: 1 Visit OT Treatments $Self Care/Home Management : 23-37 mins  Dessie Coma, M.S. OTR/L  09/25/21, 4:04 PM  ascom [phone]

## 2021-09-25 NOTE — Care Management Important Message (Signed)
Important Message  Patient Details  Name: MODINE OPPENHEIMER MRN: [id number] Date of Birth: [birth year]   Medicare Important Message Given:  Yes     Juliann Pulse A Electra Paladino 09/25/2021, 1:35 PM

## 2021-09-25 NOTE — Progress Notes (Signed)
Physical Therapy Treatment Patient Details Name: Andrea Stewart MRN: [id number] DOB: 04/08/1946 Today's Date: 09/25/2021   History of Present Illness Andrea Stewart is a 75 y.o. Caucasian female with medical history significant for COPD, depression, hypertension, type 2 diabetes mellitus end-stage renal disease on peritoneal dialysis and obesity, who presented to the emergency room following mechanical fall getting to her BSC and fell on her right side with subsequent right shoulder and hip pain.  Imaging showed a nondisplaced fracture of the lateral humeral head and an undisplaced right femoral intratrochanteric fracture.    PT Comments    Pt received seated in recliner chair and agreeable to PT treatment. Pt educated on WBAT status of her R UE and encouraged her to attempt to put more weight through her R arm for usage of RW. Pt able to complete sit to stand transfer from recliner with minA. Pt required assistance with placement of R UE onto RW prior to transfer. Pt ambulated 8 ft x 2 with RW + minA and verbal cues for posture and sequencing of assistive device. Pt demonstrating decreased activity tolerance and shortness of breath after short ambulation trial. Required prolonged seated rest break prior to ambulating back to recliner chair. Will continue to work with patient to improve overall mobility and reduce fall risk.    Recommendations for follow up therapy are one component of a multi-disciplinary discharge planning process, led by the attending physician.  Recommendations may be updated based on patient status, additional functional criteria and insurance authorization.  Follow Up Recommendations  Acute inpatient rehab (3hours/day)     Assistance Recommended at Discharge Frequent or constant Supervision/Assistance  Equipment Recommendations  Other (comment) (TBD next venue of care)    Recommendations for Other Services       Precautions / Restrictions  Precautions Precautions: Fall Restrictions Weight Bearing Restrictions: Yes RUE Weight Bearing: Weight bearing as tolerated RLE Weight Bearing: Weight bearing as tolerated Other Position/Activity Restrictions: RUE sling for comfort     Mobility  Bed Mobility               General bed mobility comments: Recieved in recliner chair    Transfers Overall transfer level: Needs assistance Equipment used: Rolling walker (2 wheels) Transfers: Sit to/from Stand Sit to Stand: Min assist           General transfer comment: MinA to stand from recliner chair. AAROM for R UE up to RW. Cues for forward lean to stand    Ambulation/Gait Ambulation/Gait assistance: Min assist Gait Distance (Feet): 8 Feet x 2 Assistive device: Rolling walker (2 wheels) Gait Pattern/deviations: Step-to pattern;Decreased step length - right;Decreased weight shift to right;Antalgic Gait velocity: decreased     General Gait Details: Decreased gait speed and antalgic throughout, some assistance required for management of RW   Stairs             Wheelchair Mobility    Modified Rankin (Stroke Patients Only)       Balance Overall balance assessment: Needs assistance;History of Falls Sitting-balance support: No upper extremity supported;Feet supported Sitting balance-Leahy Scale: Good     Standing balance support: Bilateral upper extremity supported;During functional activity Standing balance-Leahy Scale: Fair Standing balance comment: Increased forward flexed posture during dynamic gait                            Cognition Arousal/Alertness: Awake/alert Behavior During Therapy: WFL for tasks assessed/performed Overall Cognitive Status: Within Functional  Limits for tasks assessed                                          Exercises General Exercises - Lower Extremity Long Arc Quad: AROM;Strengthening;Both;20 reps;Seated Toe Raises:  AROM;Strengthening;Both;20 reps;Seated Heel Raises: AROM;Strengthening;Both;20 reps;Seated    General Comments        Pertinent Vitals/Pain Pain Assessment: No/denies pain Faces Pain Scale: Hurts little more Pain Location: R shoulder, R hip Pain Descriptors / Indicators: Dull;Discomfort Pain Intervention(s): Limited activity within patient's tolerance;Repositioned    Home Living     Available Help at Discharge: Family;Available 24 hours/day (children and their families to provide 24/7 assist of the couple after CIR)               Additional Comments: Patietn on home peritoneal dialysis. Her spouse is her trained partner and does most of the setup but he is also a CIR patient at the same time as she    Prior Function            PT Goals (current goals can now be found in the care plan section) Acute Rehab PT Goals PT Goal Formulation: With patient Time For Goal Achievement: 10/06/21 Potential to Achieve Goals: Good Progress towards PT goals: Progressing toward goals    Frequency    7X/week      PT Plan Current plan remains appropriate    Co-evaluation              AM-PAC PT "6 Clicks" Mobility   Outcome Measure  Help needed turning from your back to your side while in a flat bed without using bedrails?: A Little Help needed moving from lying on your back to sitting on the side of a flat bed without using bedrails?: A Little Help needed moving to and from a bed to a chair (including a wheelchair)?: A Little Help needed standing up from a chair using your arms (e.g., wheelchair or bedside chair)?: A Little Help needed to walk in hospital room?: A Lot Help needed climbing 3-5 steps with a railing? : Total 6 Click Score: 15    End of Session Equipment Utilized During Treatment: Gait belt Activity Tolerance: Patient tolerated treatment well;Patient limited by pain Patient left: in chair;with call bell/phone within reach;with chair alarm set Nurse  Communication: Mobility status PT Visit Diagnosis: Unsteadiness on feet (R26.81);Other abnormalities of gait and mobility (R26.89);Repeated falls (R29.6);Muscle weakness (generalized) (M62.81);History of falling (Z91.81);Difficulty in walking, not elsewhere classified (R26.2)     Time: 6789-3810 PT Time Calculation (min) (ACUTE ONLY): 24 min  Charges:  $Therapeutic Activity: 23-37 mins                     Andrey Campanile, SPT   Andrey Campanile 09/25/2021, 3:50 PM

## 2021-09-25 NOTE — Progress Notes (Signed)
Inpatient Rehabilitation Admissions Coordinator   I await insurance determination for possible [facility] admit as well as bed availability. Patient and family are aware disposition pending.  Danne Baxter, RN, MSN Rehab Admissions Coordinator [phone] 09/25/2021 11:59 AM

## 2021-09-25 NOTE — PMR Pre-admission (Signed)
PMR Admission Coordinator Pre-Admission Assessment  Patient: Andrea Stewart is an 75 y.o., female MRN: [id number] DOB: 1946-04-09 Height: _0  (149.9 cm) Weight: 88.2 kg  Insurance Information HMO:     PPO:      PCP:      IPA:      80/20:      OTHER:  PRIMARY: [employer]      Policy#: [id number]      Subscriber: pt CM Name: Marlowe Kays      Phone#: [phone]     Fax#: [phone] Pre-Cert#: [id number] for 7 days      Employer:  Benefits:  Phone #: [phone]     Name: 11/10 Eff. Date: 11/15/2018     Deduct: none      Out of Pocket Max: $3450      Life Max: none CIR: $325 co pay per day days 1 until 6      SNF: no c copay days 1 until 20; $184 copay y per day days 21 until 100 Outpatient: $15 per visit     Co-Pay: visits per medical neccesity Home Health: 80%      Co-Pay: visits per medical neccesity DME: 80%     Co-Pay: 205 Providers: in network  SECONDARY: none       Financial Counselor:       Phone#:   [employer] Engineer, [employer]" for patients in Inpatient Rehabilitation Facilities with attached "Privacy Act [place] Records" was provided and verbally reviewed with: Patient and Family  Emergency Contact Information Contact Information     Name Relation Home Work [place] Son   [phone]   Laurie, [place] [phone]       Current Medical History  Patient Admitting Diagnosis: fall with polytrauma  History of Present Illness: 75 year old female with medical history of COPD, depression, HTN, type 2 DM and ESRD on peritoneal dialysis. Presented to [facility] on 11/7 with mechanical fall while using her BSC, stumbled and fell with subsequent pain to should her hip. Imaging showed a nondisplaced fracture of the lateral humeral head and an undisplaced right femoral intra trochanteric fracture. Ortho consulted and recommended non operative management. RUE sling and oral pain regimen. HTN to continue on Lasix and  spironolactone and metoprolol. Had an episode of hypoglycemia with mediation adjustment. Nephrology consulted to manage peritoneal dialysis. On 11/11 noted a significant amount of fibrin within the drain bag. Last fill administered heparin. To monitor.   Patient's medical record from [facility] has been reviewed by the rehabilitation admission coordinator and physician.  Past Medical History  Past Medical History:  Diagnosis Date   Backache, unspecified    s/p c-spine and l-spine fusions   COPD (chronic obstructive pulmonary disease) ([place])    Degeneration of intervertebral disc, site unspecified    Depressive disorder, not elsewhere classified    Disorder of bone and cartilage, unspecified    Family history of adverse reaction to anesthesia    mother did, but patient not sure of the complication   HTN (hypertension)    Hx of left heart catheterization by cutdown    a. 7 years ago; no significant cad; b. Lexiscan 2014: no significant ischemia, no significant EKG changes concerning for ischemia, EF 67%, overall low risk study   Kidney failure    Dr Cyndia Diver   Myalgia and myositis, unspecified    Obesity, unspecified    Personal history of tobacco use, presenting hazards to health    1/2  ppd   Plantar fascial fibromatosis    S/P cholecystectomy    S/p nephrectomy    Type II or unspecified type diabetes mellitus without mention of complication, not stated as uncontrolled    Unspecified essential hypertension    Unspecified hereditary and idiopathic peripheral neuropathy    secondary to diabetes   Has the patient had major surgery during 100 days prior to admission? No  Family History   family history includes COPD in her father; Cancer in her daughter, father, and mother; Colon cancer in her mother; Coronary artery disease in her mother; Diabetes in her brother, mother, paternal aunt, and paternal uncle; Heart attack (age of onset: 71) in her brother; Hypertension in her mother; Prostate  cancer in her father; Sleep apnea in her brother.  Current Medications  Current Facility-Administered Medications:    (feeding supplement) PROSource Plus liquid 30 mL, 30 mL, Oral, Daily, Mansy, Jan A, MD, 30 mL at 09/30/21 0934   [EXPIRED] acetaminophen (TYLENOL) tablet 1,000 mg, 1,000 mg, Oral, Q8H, 1,000 mg at 09/23/21 1400 **FOLLOWED BY** acetaminophen (TYLENOL) tablet 650 mg, 650 mg, Oral, Q6H PRN, Elodia Florence., MD, 650 mg at 09/26/21 0653   albuterol (PROVENTIL) (2.5 MG/3ML) 0.083% nebulizer solution 3 mL, 3 mL, Inhalation, Q4H PRN, Mansy, Jan A, MD   ALPRAZolam Duanne Moron) tablet 0.5 mg, 0.5 mg, Oral, QHS, Mansy, Jan A, MD, 0.5 mg at 09/29/21 2137   atorvastatin (LIPITOR) tablet 20 mg, 20 mg, Oral, Q supper, Mansy, Jan A, MD, 20 mg at 09/29/21 1615   dialysis solution 1.5% low-MG/low-CA dianeal solution, , Intraperitoneal, Q24H, Breeze, Shantelle, NP   feeding supplement (NEPRO CARB STEADY) liquid 237 mL, 237 mL, Oral, Q24H, Mansy, Jan A, MD, 237 mL at 09/30/21 4098   furosemide (LASIX) tablet 80 mg, 80 mg, Oral, Daily, Mansy, Jan A, MD, 80 mg at 09/30/21 0935   gentamicin cream (GARAMYCIN) 0.1 % 1 application, 1 application, Topical, Daily, Breeze, Shantelle, NP, 1 application at 11/91/47 0935   heparin 1000 unit/ml injection 500 Units, 500 Units, Intraperitoneal, PRN, Colon Flattery, NP   hydrOXYzine (ATARAX/VISTARIL) tablet 25 mg, 25 mg, Oral, BID PRN, Mansy, Jan A, MD, 25 mg at 09/29/21 0258   insulin aspart (novoLOG) injection 0-9 Units, 0-9 Units, Subcutaneous, TID WC, Enzo Bi, MD, 7 Units at 09/30/21 0805   insulin aspart (novoLOG) injection 4 Units, 4 Units, Subcutaneous, TID WC, Enzo Bi, MD, 4 Units at 09/30/21 0805   insulin detemir (LEVEMIR) injection 12 Units, 12 Units, Subcutaneous, BID, Enzo Bi, MD, 12 Units at 09/30/21 0935   magnesium hydroxide (MILK OF MAGNESIA) suspension 30 mL, 30 mL, Oral, Daily PRN, Mansy, Jan A, MD, 30 mL at 09/23/21 0800   magnesium  oxide (MAG-OX) tablet 800 mg, 800 mg, Oral, BID, Enzo Bi, MD, 800 mg at 09/30/21 0934   metoprolol succinate (TOPROL-XL) 24 hr tablet 25 mg, 25 mg, Oral, Q breakfast, Mansy, Jan A, MD, 25 mg at 09/30/21 0754   mometasone-formoterol (DULERA) 200-5 MCG/ACT inhaler 2 puff, 2 puff, Inhalation, BID, Mansy, Jan A, MD, 2 puff at 09/30/21 0935   multivitamin liquid, , Oral, Daily, Mansy, Jan A, MD, 15 mL at 09/28/21 1012   nitroGLYCERIN (NITROSTAT) SL tablet 0.4 mg, 0.4 mg, Sublingual, Q5 Min x 3 PRN, Mansy, Jan A, MD   ondansetron [facility]) tablet 4 mg, 4 mg, Oral, Q6H PRN, 4 mg at 09/24/21 0927 **OR** ondansetron (ZOFRAN) injection 4 mg, 4 mg, Intravenous, Q6H PRN, Mansy, Arvella Merles, MD, 4  mg at 09/21/21 1126   ondansetron (ZOFRAN) tablet 4 mg, 4 mg, Oral, Q6H PRN, Mansy, Jan A, MD   oxyCODONE (Oxy IR/ROXICODONE) immediate release tablet 2.5 mg, 2.5 mg, Oral, Q4H PRN, Elodia Florence., MD, 2.5 mg at 09/30/21 9892   pantoprazole (PROTONIX) EC tablet 40 mg, 40 mg, Oral, BID, Mansy, Jan A, MD, 40 mg at 09/30/21 0936   polyethylene glycol (MIRALAX / GLYCOLAX) packet 17 g, 17 g, Oral, Daily PRN, Mansy, Jan A, MD   potassium chloride (KLOR-CON) packet 40 mEq, 40 mEq, Oral, Daily, Lanora Manis, Madhu, MD, 40 mEq at 09/30/21 0936   promethazine (PHENERGAN) tablet 12.5 mg, 12.5 mg, Oral, Q6H PRN **OR** promethazine (PHENERGAN) 6.25 mg in sodium chloride 0.9 % 50 mL IVPB, 6.25 mg, Intravenous, Q6H PRN **OR** promethazine (PHENERGAN) suppository 12.5 mg, 12.5 mg, Rectal, Q6H PRN, Elodia Florence., MD   senna [facility]) tablet 8.6 mg, 1 tablet, Oral, QHS PRN, Mansy, Jan A, MD   spironolactone (ALDACTONE) tablet 25 mg, 25 mg, Oral, Daily, Mansy, Jan A, MD, 25 mg at 09/30/21 1194   traZODone (DESYREL) tablet 25 mg, 25 mg, Oral, QHS PRN, Mansy, Jan A, MD, 25 mg at 09/29/21 2155   Vitamin D (Ergocalciferol) (DRISDOL) capsule 50,000 Units, 50,000 Units, Oral, Weekly, Mansy, Arvella Merles, MD, 50,000 Units at 09/28/21  1727  Patients Current Diet:  Diet Order             Diet - low sodium heart healthy           Diet regular Room service appropriate? Yes; Fluid consistency: Thin  Diet effective now                  Precautions / Restrictions Precautions Precautions: Fall Precaution Comments: WBAT on R LE and R UE, R UE remained in sling for duration of therapy session and opted to keep it on after. Restrictions Weight Bearing Restrictions: Yes RUE Weight Bearing: Weight bearing as tolerated LUE Weight Bearing: Weight bearing as tolerated RLE Weight Bearing: Weight bearing as tolerated LLE Weight Bearing: Weight bearing as tolerated Other Position/Activity Restrictions: RUE sling for comfort   Has the patient had 2 or more falls or a fall with injury in the past year? Yes Last fell 12/2020  Prior Activity Level Limited Community (1-2x/wk): Mod I with RW. Her spouse was her caregiver managing mostly her CCPD, med management and cooking. Family felt she could do more of her care if encouraged  Prior Functional Level Self Care: Did the patient need help bathing, dressing, using the toilet or eating? Needed some help  Indoor Mobility: Did the patient need assistance with walking from room to room (with or without device)? Independent  Stairs: Did the patient need assistance with internal or external stairs (with or without device)? Independent  Functional Cognition: Did the patient need help planning regular tasks such as shopping or remembering to take medications? Independent  Patient Information Are you of Hispanic, Latino/a,or Spanish origin?: A. No, not of Hispanic, Latino/a, or Spanish origin What is your race?: A. White Do you need or want an interpreter to communicate with a doctor or health care staff?: 0. No  Patient's Response To:  Health Literacy and Transportation Is the patient able to respond to health literacy and transportation needs?: Yes Health Literacy - How often do  you need to have someone help you when you read instructions, pamphlets, or other written material from your doctor or pharmacy?: Never In the past  12 months, has lack of transportation kept you from medical appointments or from getting medications?: No In the past 12 months, has lack of transportation kept you from meetings, work, or from getting things needed for daily living?: No  Development worker, international aid / [place] Devices/Equipment: Sling (specify type), Oxygen, CBG Meter, Eyeglasses, Walker (specify type) Home Equipment: Rollator (4 wheels), BSC/3in1, Wheelchair - manual  Prior Device Use: Indicate devices/aids used by the patient prior to current illness, exacerbation or injury? Walker  Current Functional Level Cognition  Overall Cognitive Status: Within Functional Limits for tasks assessed Orientation Level: Oriented X4 General Comments: pt is A and O x 4. Frustrated with current situation and upset with nephrology about recommendation of HD versus PD    Extremity Assessment (includes Sensation/Coordination)  Upper Extremity Assessment: RUE deficits/detail RUE Deficits / Details: 3+/5 grip strength RUE: Unable to fully assess due to pain  Lower Extremity Assessment: Generalized weakness RLE Deficits / Details: Pt had 3+/5 R knee flex/ ext and 3/5 R hip flexion strength RLE Sensation: WNL    ADLs  Overall ADL's : Needs assistance/impaired General ADL Comments: MAX A don sling seated EOB. MIN A + HW for ADL t/f.    Mobility  Overal bed mobility: Needs Assistance Bed Mobility: Supine to Sit Supine to sit: Mod assist, HOB elevated Sit to supine: Max assist General bed mobility comments: MaxA to transition sup to sit on Left side of bed    Transfers  Overall transfer level: Needs assistance Equipment used: Hemi-walker Transfers: Sit to/from Stand Sit to Stand: Min assist, Min guard Bed to/from chair/wheelchair/BSC transfer type:: Stand pivot Stand pivot  transfers: Min assist Step pivot transfers: Mod assist General transfer comment:  (Good demonstration of safety awareness)    Ambulation / Gait / Stairs / Wheelchair Mobility  Ambulation/Gait Ambulation/Gait assistance: Min Web designer (Feet): 15 Feet Assistive device: Hemi-walker Gait Pattern/deviations: Step-to pattern, Decreased step length - right, Decreased weight shift to right, Antalgic, Trunk flexed General Gait Details: deferred due to weakness and fatigue today.  poor ability to step in place. Gait velocity: decreased    Posture / Balance Balance Overall balance assessment: Needs assistance Sitting-balance support: No upper extremity supported, Feet supported Sitting balance-Leahy Scale: Good Standing balance support: Single extremity supported, Reliant on assistive device for balance Standing balance-Leahy Scale: Fair Standing balance comment:  (Pt able to maintain static stance for hygiene after use of BSC)    Special needs/care consideration Dialysis: Peritoneal; CCPD nightly. Her spouse was her trained partner who did most of the set up and management. Patient. Sons and their wives could be directed by patient to set up as spouse also a patient on CIR at same time  Hgb A1c 9.7   Previous Home Environment  Living Arrangements: Spouse/significant other  Lives With: Spouse Available Help at Discharge: Family, Available 24 hours/day (children and their families to provide 24/7 assist of the couple after CIR) Type of Home: House Home Layout: One level Home Access: Ramped entrance Bathroom Shower/Tub: Multimedia programmer: Handicapped height Bathroom Accessibility: Yes How Accessible: Accessible via [place]: No Additional Comments: Patietn on home peritoneal dialysis. Her spouse is her trained partner and does most of the setup but he is also a CIR patient at the same time as she  Discharge Living Setting Plans for Discharge Living  Setting: Patient's home, Lives with (comment) (spouse) Type of Home at Discharge: House Discharge Home Layout: One level Discharge Home Access: Ramped  entrance Discharge Bathroom Shower/Tub: Walk-in shower Discharge Bathroom Toilet: Handicapped height Discharge Bathroom Accessibility: Yes How Accessible: Accessible via walker Does the patient have any problems obtaining your medications?: No  Social/Family/Support Systems Patient Roles: Spouse, Parent Contact Information: son, Hilliard Clark and spouse, Carloyn Manner main contacts Anticipated Caregiver: sons and their families to provide 24/7 assist of the couple after CIR admit Anticipated Caregiver's Contact Information: see contacts Ability/Limitations of Caregiver: spouse is typically her caregiver but he is also a CIR patient at the same time Caregiver Availability: 24/7 Discharge Plan Discussed with Primary Caregiver: Yes Is Caregiver In Agreement with Plan?: Yes Does Caregiver/Family have Issues with Lodging/Transportation while Pt is in Rehab?: No  Goals Patient/Family Goal for Rehab: Mod I to supervision with PT and OT Expected length of stay: ELOS 7 to 10 days Pt/Family Agrees to Admission and willing to participate: Yes Program Orientation Provided & Reviewed with Pt/Caregiver Including Roles  & Responsibilities: Yes  Decrease burden of Care through IP rehab admission: n/a  Possible need for SNF placement upon discharge: not anticipated; if ever SNF, she would have to be transition to Hemodialysis  Patient Condition: I have reviewed medical records from [facility], spoken with CM, and patient, spouse, and son. I met with patient at the bedside for inpatient rehabilitation assessment.  Patient will benefit from ongoing PT and OT, can actively participate in 3 hours of therapy a day 5 days of the week, and can make measurable gains during the admission.  Patient will also benefit from the coordinated team approach during an Inpatient Acute  Rehabilitation admission.  The patient will receive intensive therapy as well as Rehabilitation physician, nursing, social worker, and care management interventions.  Due to bladder management, bowel management, safety, skin/wound care, disease management, medication administration, pain management, and patient education the patient requires 24 hour a day rehabilitation nursing.  The patient is currently min to mod assist overall with mobility and basic ADLs.  Discharge setting and therapy post discharge at home with home health is anticipated.  Patient has agreed to participate in the Acute Inpatient Rehabilitation Program and will admit today.  Preadmission Screen Completed By:  Cleatrice Burke, 09/30/2021 10:01 AM ______________________________________________________________________   Discussed status with Dr. Ranell Patrick on 09/30/2021 at 1001 and received approval for admission today.  Admission Coordinator:  Cleatrice Burke, RN, time  1001 Date  09/30/2021   Assessment/Plan: Diagnosis: Closed right hip fracture Does the need for close, 24 hr/day Medical supervision in concert with the patient's rehab needs make it unreasonable for this patient to be served in a less intensive setting? Yes Co-Morbidities requiring supervision/potential complications: type 2 diabetes mellitus with constipation, depression, peripheral neuropathy, morbid obesity BMI 39.27, coronary atherosclerosis Due to bladder management, bowel management, safety, skin/wound care, disease management, medication administration, pain management, and patient education, does the patient require 24 hr/day rehab nursing? Yes Does the patient require coordinated care of a physician, rehab nurse, PT, OT to address physical and functional deficits in the context of the above medical diagnosis(es)? Yes Addressing deficits in the following areas: balance, endurance, locomotion, strength, transferring, bowel/bladder control,  bathing, dressing, feeding, grooming, toileting, and psychosocial support Can the patient actively participate in an intensive therapy program of at least 3 hrs of therapy 5 days a week? Yes The potential for patient to make measurable gains while on inpatient rehab is excellent Anticipated functional outcomes upon discharge from inpatient rehab: modified independent PT, modified independent OT, independent SLP Estimated rehab length of stay to reach  the above functional goals is: 7-10 days Anticipated discharge destination: Home 10. Overall Rehab/Functional Prognosis: excellent   MD Signature: Leeroy Cha, MD

## 2021-09-25 NOTE — Progress Notes (Signed)
[facility]  ROUNDING NOTE   Subjective:   Andrea Stewart is a 75 year old Caucasian female with past medical conditions including depression, COPD, hypertension, diabetes, and end-stage renal disease on peritoneal dialysis.  She presents to the emergency room after a fall while going to the bathroom and landing on her right shoulder and right hip.  She is now complaining of right shoulder and hip pain.  Patient will be admitted for Ventral hernia without obstruction or gangrene [K43.9] Closed right hip fracture ([place]) [S72.001A] Fall, initial encounter [W19.XXXA] Closed intertrochanteric fracture of hip, right, initial encounter ([place]) [S72.141A] Humeral head fracture, right, closed, initial encounter [S42.291A]  Patient is known to our office and receives peritoneal dialysis care from Dr. Juleen China.    It appears that PD stopped after the third drain.  We noted a significant amount of fibrin within the drain bag today.  We are in the process of administering a last fill with heparin in it.  Objective:  Vital signs in last 24 hours:  Temp:  [97.6 F (36.4 C)-98.6 F (37 C)] 97.6 F (36.4 C) (11/11 0750) Pulse Rate:  [71-92] 83 (11/11 0750) Resp:  [15-18] 17 (11/11 0750) BP: (106-130)/(61-70) 111/64 (11/11 0750) SpO2:  [90 %-98 %] 96 % (11/11 0750)  Weight change:  Filed Weights   09/21/21 1706 09/22/21 1817 09/23/21 2136  Weight: 65.1 kg 82.1 kg 79.8 kg    Intake/Output: I/O last 3 completed shifts: In: 600 [P.O.:600] Out: -    Intake/Output this shift:  No intake/output data recorded.  Physical Exam: General: NAD, resting in bed  Head: Normocephalic, atraumatic. Moist oral mucosal membranes  Eyes: Anicteric  Lungs:  Clear to auscultation, normal effort  Heart: Regular rate and rhythm  Abdomen:  Soft, nontender, nondistended  Extremities: Trace peripheral edema.  Neurologic: alert, oriented, moving all four extremities  Skin: Generalized abrasions RUE   Access: Peritoneal dialysis catheter    Basic Metabolic Panel: Recent Labs  Lab 09/21/21 0225 09/22/21 0646 09/24/21 0346  NA 134* 129* 127*  K 3.5 3.2* 3.3*  CL 94* 92* 93*  CO2 25 25 25   GLUCOSE 125* 219* 226*  BUN 33* 42* 44*  CREATININE 7.53* 8.05* 8.06*  CALCIUM 8.9 8.2* 7.7*  PHOS  --   --  7.1*     Liver Function Tests: Recent Labs  Lab 09/24/21 0346  ALBUMIN 1.8*    No results for input(s): LIPASE, AMYLASE in the last 168 hours. No results for input(s): AMMONIA in the last 168 hours.  CBC: Recent Labs  Lab 09/21/21 0225 09/22/21 0646 09/24/21 0346  WBC 12.9* 9.9 7.0  NEUTROABS 10.2*  --   --   HGB 13.1 11.2* 11.0*  HCT 39.3 34.3* 32.8*  MCV 92.0 92.0 91.6  PLT 276 240 241     Cardiac Enzymes: No results for input(s): CKTOTAL, CKMB, CKMBINDEX, TROPONINI in the last 168 hours.  BNP: Invalid input(s): POCBNP  CBG: Recent Labs  Lab 09/24/21 1159 09/24/21 1622 09/24/21 2016 09/24/21 2143 09/25/21 0752  GLUCAP 215* 210* 427* 396* 287*     Microbiology: Results for orders placed or performed during the hospital encounter of 09/21/21  Resp Panel by RT-PCR (Flu A&B, Covid) Nasopharyngeal Swab     Status: None   Collection Time: 09/21/21  2:25 AM   Specimen: Nasopharyngeal Swab; Nasopharyngeal(NP) swabs in vial transport medium  Result Value Ref Range Status   SARS Coronavirus 2 by RT PCR NEGATIVE NEGATIVE Final    Comment: (NOTE)  SARS-CoV-2 target nucleic acids are NOT DETECTED.  The SARS-CoV-2 RNA is generally detectable in upper respiratory specimens during the acute phase of infection. The lowest concentration of SARS-CoV-2 viral copies this assay can detect is 138 copies/mL. A negative result does not preclude SARS-Cov-2 infection and should not be used as the sole basis for treatment or other patient management decisions. A negative result may occur with  improper specimen collection/handling, submission of specimen other than  nasopharyngeal swab, presence of viral mutation(s) within the areas targeted by this assay, and inadequate number of viral copies(<138 copies/mL). A negative result must be combined with clinical observations, patient history, and epidemiological information. The expected result is Negative.  Fact Sheet for Patients:  EntrepreneurPulse.com.au  Fact Sheet for Healthcare Providers:  IncredibleEmployment.be  This test is no t yet approved or cleared by the [place] FDA and  has been authorized for detection and/or diagnosis of SARS-CoV-2 by FDA under an Emergency Use Authorization (EUA). This EUA will remain  in effect (meaning this test can be used) for the duration of the COVID-19 declaration under Section 564(b)(1) of the Act, 21 U.S.C.section 360bbb-3(b)(1), unless the authorization is terminated  or revoked sooner.       Influenza A by PCR NEGATIVE NEGATIVE Final   Influenza B by PCR NEGATIVE NEGATIVE Final    Comment: (NOTE) The Xpert Xpress SARS-CoV-2/FLU/RSV plus assay is intended as an aid in the diagnosis of influenza from Nasopharyngeal swab specimens and should not be used as a sole basis for treatment. Nasal washings and aspirates are unacceptable for Xpert Xpress SARS-CoV-2/FLU/RSV testing.  Fact Sheet for Patients: EntrepreneurPulse.com.au  Fact Sheet for Healthcare Providers: IncredibleEmployment.be  This test is not yet approved or cleared by the [place] FDA and has been authorized for detection and/or diagnosis of SARS-CoV-2 by FDA under an Emergency Use Authorization (EUA). This EUA will remain in effect (meaning this test can be used) for the duration of the COVID-19 declaration under Section 564(b)(1) of the Act, 21 U.S.C. section 360bbb-3(b)(1), unless the authorization is terminated or revoked.  Performed at [facility], [address], [place], [place]  [address]   Surgical pcr screen     Status: None   Collection Time: 09/21/21 10:15 AM   Specimen: Nasal Mucosa; Nasal Swab  Result Value Ref Range Status   MRSA, PCR NEGATIVE NEGATIVE Final   Staphylococcus aureus NEGATIVE NEGATIVE Final    Comment: (NOTE) The Xpert SA Assay (FDA approved for NASAL specimens in patients 27 years of age and older), is one component of a comprehensive surveillance program. It is not intended to diagnose infection nor to guide or monitor treatment. Performed at [facility], [address] [place], [place] [address]     Coagulation Studies: No results for input(s): LABPROT, INR in the last 72 hours.   Urinalysis: No results for input(s): COLORURINE, LABSPEC, PHURINE, GLUCOSEU, HGBUR, BILIRUBINUR, KETONESUR, PROTEINUR, UROBILINOGEN, NITRITE, LEUKOCYTESUR in the last 72 hours.  Invalid input(s): APPERANCEUR    Imaging: No results found.   Medications:    dialysis solution 1.5% low-MG/low-CA     promethazine (PHENERGAN) injection (IM or IVPB)      (feeding supplement) PROSource Plus  30 mL Oral Daily   ALPRAZolam  0.5 mg Oral QHS   atorvastatin  20 mg Oral Q supper   feeding supplement (NEPRO CARB STEADY)  237 mL Oral Q24H   furosemide  80 mg Oral Daily   gentamicin cream  1 application Topical Daily  insulin aspart  0-9 Units Subcutaneous TID AC & HS   insulin aspart  3 Units Subcutaneous TID WC   insulin NPH Human  12 Units Subcutaneous BID AC & HS   metoprolol succinate  25 mg Oral Q breakfast   mometasone-formoterol  2 puff Inhalation BID   multivitamin   Oral Daily   pantoprazole  40 mg Oral BID   spironolactone  25 mg Oral Daily   Vitamin D (Ergocalciferol)  50,000 Units Oral Weekly   [EXPIRED] acetaminophen **FOLLOWED BY** acetaminophen, albuterol, heparin, hydrOXYzine, magnesium hydroxide, nitroGLYCERIN, ondansetron **OR** ondansetron (ZOFRAN) IV, ondansetron, oxyCODONE, polyethylene glycol, promethazine **OR**  promethazine (PHENERGAN) injection (IM or IVPB) **OR** promethazine, senna, traZODone  Assessment/ Plan:  Andrea Stewart is a 75 y.o.  female with past medical conditions including depression, COPD, hypertension, diabetes, and end-stage renal disease on peritoneal dialysis.  Patient has been admitted to [facility] for Ventral hernia without obstruction or gangrene [K43.9] Closed right hip fracture ([place]) [S72.001A] Fall, initial encounter [W19.XXXA] Closed intertrochanteric fracture of hip, right, initial encounter ([place]) [S72.141A] Humeral head fracture, right, closed, initial encounter [S42.291A]  [facility] [address]/73kg CCPD 8 hours 5 exchanges 2072ml fills.    End-stage renal disease on peritoneal dialysis.  The patient's PD did not function very well after the third exchange.  We found fibrin in the drain bag.  We are in the process of instilling a last fill now that will contain heparin 2000 units.  Hopefully this will help to break up any underlying fibrin so that her treatment may proceed without issues tonight.  The bigger issue remains her disposition.  Social work is attempting to place the patient in an inpatient rehabilitation bed.  Her husband is also significantly ill apparently with intracranial hemorrhage.  If she has to go to an alternative rehabilitation facility we may need to reconsider hemodialysis however patient has been hesitant to proceed with this route.  Hold off on Epogen for now.  Anemia of chronic kidney disease Lab Results  Component Value Date   HGB 11.0 (L) 09/24/2021  Iron sucrose outpatient Will continue to monitor bone minerals during this admission  3. Secondary Hyperparathyroidism: with outpatient labs: PTH 3.8, phosphorus 6.0, calcium 8.3 on 08/31/21.   Lab Results  Component Value Date   CALCIUM 7.7 (L) 09/24/2021   CAION 1.13 (L) 07/23/2020   PHOS 7.1 (H) 09/24/2021  Phosphorus high at 7.1 but suspect due to inadequate dialysis over the past  several days.  Continue to monitor serum phosphorus periodically.  4. Diabetes mellitus type II with chronic kidney disease: insulin dependent. Home regimen includes NPH and Humulin. Most recent hemoglobin A1c is 9.7 on 09/21/21.  Glycemic control as per hospitalist.  5.  Right trochanter hip fracture with humeral head closed fracture, status post fall.  Not a surgical candidate, medical management.  Rehab versus home.   LOS: 4 Giavonni Cizek 11/11/202210:29 AM

## 2021-09-25 NOTE — Progress Notes (Addendum)
Inpatient Rehabilitation Admissions Coordinator   I have received approval from [place] for [facility] admit. I spoke with her by phone, her son, Hilliard Clark and Gaylan Gerold at [facility] [id number]. I await bed availability to admit early next week. Her spouse was also approved for [facility] admit.  I will follow up on Monday. Acute team and TOC made aware. We do provide peritoneal dialysis so there is no reason to transition to hemodialysis.  Danne Baxter, RN, MSN Rehab Admissions Coordinator [phone] 09/25/2021 2:26 PM

## 2021-09-25 NOTE — Progress Notes (Signed)
Patient called and stated she was feeling SOB oxygen sat on RA 89, patient was placed on 2L of oxygen [place].

## 2021-09-26 LAB — BASIC METABOLIC PANEL
Anion gap: 10 (ref 5–15)
BUN: 51 mg/dL — ABNORMAL HIGH (ref 8–23)
CO2: 27 mmol/L (ref 22–32)
Calcium: 8.3 mg/dL — ABNORMAL LOW (ref 8.9–10.3)
Chloride: 93 mmol/L — ABNORMAL LOW (ref 98–111)
Creatinine, Ser: 7.9 mg/dL — ABNORMAL HIGH (ref 0.44–1.00)
GFR, Estimated: 5 mL/min — ABNORMAL LOW (ref >60)
Glucose, Bld: 305 mg/dL — ABNORMAL HIGH (ref 70–99)
Potassium: 3.2 mmol/L — ABNORMAL LOW (ref 3.5–5.1)
Sodium: 130 mmol/L — ABNORMAL LOW (ref 135–145)

## 2021-09-26 LAB — MAGNESIUM: Magnesium: 1.7 mg/dL (ref 1.7–2.4)

## 2021-09-26 LAB — CBC
HCT: 31.9 % — ABNORMAL LOW (ref 36.0–46.0)
Hemoglobin: 10.6 g/dL — ABNORMAL LOW (ref 12.0–15.0)
MCH: 30.4 pg (ref 26.0–34.0)
MCHC: 33.2 g/dL (ref 30.0–36.0)
MCV: 91.4 fL (ref 80.0–100.0)
Platelets: 252 10*3/uL (ref 150–400)
RBC: 3.49 MIL/uL — ABNORMAL LOW (ref 3.87–5.11)
RDW: 14.3 % (ref 11.5–15.5)
WBC: 7.8 10*3/uL (ref 4.0–10.5)
nRBC: 0 % (ref 0.0–0.2)

## 2021-09-26 LAB — GLUCOSE, CAPILLARY
Glucose-Capillary: 343 mg/dL — ABNORMAL HIGH (ref 70–99)
Glucose-Capillary: 264 mg/dL — ABNORMAL HIGH (ref 70–99)
Glucose-Capillary: 55 mg/dL — ABNORMAL LOW (ref 70–99)
Glucose-Capillary: 70 mg/dL (ref 70–99)
Glucose-Capillary: 154 mg/dL — ABNORMAL HIGH (ref 70–99)
Glucose-Capillary: 191 mg/dL — ABNORMAL HIGH (ref 70–99)

## 2021-09-26 MED ORDER — POTASSIUM CHLORIDE 20 MEQ PO PACK: 40.0000 meq | PACK | Freq: Two times a day (BID) | ORAL | Status: DC

## 2021-09-26 MED ORDER — POTASSIUM CHLORIDE 20 MEQ PO PACK
40.0000 meq | PACK | Freq: Every day | ORAL | Status: DC
  Administered 2021-09-26 – 2021-09-30 (×5): 40 meq via ORAL
  Filled 2021-09-26 (×5): qty 2

## 2021-09-26 MED ORDER — ALTEPLASE 2 MG IJ SOLR
INTRAMUSCULAR | Status: AC
  Filled 2021-09-26: qty 2

## 2021-09-26 NOTE — Progress Notes (Addendum)
Peritoneal dialysis machine alarming continuously. Machine displays 'low drain volume". Customer service rep called and several unsuccessful trouble shooting attempts were made. Dialysis RN called,no answer voicemail was left. AC notified, as well as NP Sharion Settler. Patient is stable, no C/O of pain or discomfort. Will continue to monitor to ensure comfort and safety.

## 2021-09-26 NOTE — Progress Notes (Addendum)
PT Cancellation Note  Patient Details Name: Andrea Stewart MRN: [id number] DOB: Oct 12, 1946   Cancelled Treatment:     PT attempt. Per discussion with RN will hold PT this morning. Pt had unsuccessful peritoneal dialysis last night. She currently is sound asleep however per RN, is not feeling well.  Current blood sugar over 300 with k+ down to 3.2. Most recent blood pressure  issoft as well. Author will return later this afternoon if pt becomes more appropriate to participate.   Willette Pa 09/26/2021, 8:12 AM

## 2021-09-26 NOTE — Progress Notes (Signed)
Physical Therapy Treatment Patient Details Name: Andrea Stewart MRN: [id number] DOB: 1946/08/01 Today's Date: 09/26/2021   History of Present Illness Andrea Stewart is a 75 y.o. Caucasian female with medical history significant for COPD, depression, hypertension, type 2 diabetes mellitus end-stage renal disease on peritoneal dialysis and obesity, who presented to the emergency room following mechanical fall getting to her BSC and fell on her right side with subsequent right shoulder and hip pain.  Imaging showed a nondisplaced fracture of the lateral humeral head and an undisplaced right femoral intratrochanteric fracture.    PT Comments    Pt was long sitting in bed upon arriving. She agrees to session with encouragement. Pt is somewhat emotional throughout session. She is frustrated with PD not working and does not want to have to do HD. She is scared she will not be able to go to inpatient rehab with her spouse. Once redirected and focused on desired task, pt was able to exit R side of bed. Due to c/o severe R arm pain, author elected to apply RUE sling and had pt use hemi-walker with LUE. She has poor standing posture throughout and is high fall risk due to unsteadiness/balance deficits. She ambulated to doorway of room before endorsing severe fatigue. Vital were stable throughout. After ambulating pt requested to perform there ex in recliner. See exercises listed below. After doing exercises, pt requested to return to bed. At conclusion of session, pt was in bed with call bell in reach + bed alarm in place.    Recommendations for follow up therapy are one component of a multi-disciplinary discharge planning process, led by the attending physician.  Recommendations may be updated based on patient status, additional functional criteria and insurance authorization.  Follow Up Recommendations  Acute inpatient rehab (3hours/day) (wants to be with spouse at rehab)     Assistance Recommended  at Discharge Frequent or constant Supervision/Assistance  Equipment Recommendations  Other (comment) (defer to next level of care)       Precautions / Restrictions Precautions Precautions: Fall Precaution Comments: WBAT on R LE and R UE, R UE remained in sling for duration of therapy session. Required Braces or Orthoses: Sling Restrictions Weight Bearing Restrictions: Yes RUE Weight Bearing: Weight bearing as tolerated RLE Weight Bearing: Weight bearing as tolerated Other Position/Activity Restrictions: RUE sling for comfort ( pt preferred to use RUE in sling during OOB activity)     Mobility  Bed Mobility Overal bed mobility: Needs Assistance Bed Mobility: Supine to Sit     Supine to sit: Mod assist;HOB elevated Sit to supine: Max assist        Transfers Overall transfer level: Needs assistance Equipment used: Hemi-walker (RUE in sling for comfort) Transfers: Sit to/from Stand Sit to Stand: Min assist;Mod assist;From elevated surface           General transfer comment: pt was able to stand from EOB 2 x and form recliner 3 x. Sghe required min assist form elevated surfaces but mod assist form lower recliner height    Ambulation/Gait Ambulation/Gait assistance: Min assist (chair follow) Gait Distance (Feet): 15 Feet Assistive device: Rolling walker (2 wheels) Gait Pattern/deviations: Step-to pattern;Decreased step length - right;Decreased weight shift to right;Antalgic;Trunk flexed Gait velocity: decreased     General Gait Details: Pt was able to ambulate to doorway of room however very slow, unsteady, step to gait pattern.    Balance Overall balance assessment: Needs assistance Sitting-balance support: No upper extremity supported;Feet supported Sitting balance-Leahy Scale: Good  Standing balance support: During functional activity;Reliant on assistive device for balance;Single extremity supported Standing balance-Leahy Scale: Poor Standing balance  comment: poor balance with only +1 UE support dueing dynamic activity      Cognition Arousal/Alertness: Awake/alert Behavior During Therapy: WFL for tasks assessed/performed Overall Cognitive Status: Within Functional Limits for tasks assessed      General Comments: pt is A and O x 4. Frustrated with current situation and upset with nephrology about recommendation of HD versus PD        Exercises Total Joint Exercises Ankle Circles/Pumps: AROM;10 reps Quad Sets: AROM;10 reps Gluteal Sets: AROM;10 reps Heel Slides: AROM;10 reps Straight Leg Raises: AROM;10 reps Long Arc Quad: AROM;10 reps;Both        Pertinent Vitals/Pain Pain Assessment: 0-10 Pain Score: 4  Faces Pain Scale: Hurts a little bit Pain Location: R shoulder, R hip Pain Descriptors / Indicators: Dull;Discomfort Pain Intervention(s): Limited activity within patient's tolerance;Monitored during session;Premedicated before session;Repositioned     PT Goals (current goals can now be found in the care plan section) Acute Rehab PT Goals Patient Stated Goal: get better Progress towards PT goals: Progressing toward goals    Frequency    7X/week      PT Plan Current plan remains appropriate       AM-PAC PT "6 Clicks" Mobility   Outcome Measure  Help needed turning from your back to your side while in a flat bed without using bedrails?: A Little Help needed moving from lying on your back to sitting on the side of a flat bed without using bedrails?: A Little Help needed moving to and from a bed to a chair (including a wheelchair)?: A Lot Help needed standing up from a chair using your arms (e.g., wheelchair or bedside chair)?: A Lot Help needed to walk in hospital room?: A Lot Help needed climbing 3-5 steps with a railing? : Total 6 Click Score: 13    End of Session Equipment Utilized During Treatment: Gait belt Activity Tolerance: Patient tolerated treatment well;Patient limited by pain Patient left: in  chair;with call bell/phone within reach;with chair alarm set Nurse Communication: Mobility status PT Visit Diagnosis: Unsteadiness on feet (R26.81);Other abnormalities of gait and mobility (R26.89);Repeated falls (R29.6);Muscle weakness (generalized) (M62.81);History of falling (Z91.81);Difficulty in walking, not elsewhere classified (R26.2)     Time: 7867-5449 PT Time Calculation (min) (ACUTE ONLY): 26 min  Charges:  $Gait Training: 8-22 mins $Therapeutic Exercise: 8-22 mins                     Julaine Fusi PTA 09/26/21, 3:55 PM

## 2021-09-26 NOTE — Progress Notes (Signed)
This nurse notified by NP to turn off dialysis machine at this time as she was able to contact on call dialysis RN.  RN Helene Kelp contacted this nurse to inform that she will be in to perform a manual drain.

## 2021-09-26 NOTE — Progress Notes (Signed)
[facility]  PROGRESS NOTE   Subjective:   Patient seen at bedside, family in attendance. Events noted.  There are problems with drainage of the PD catheter Cathflo was placed.  Objective:  Vital signs in last 24 hours:  Temp:  [97.9 F (36.6 C)-98.4 F (36.9 C)] 98.4 F (36.9 C) (11/12 1151) Pulse Rate:  [63-89] 63 (11/12 1151) Resp:  [16-18] 17 (11/12 1151) BP: (100-129)/(55-82) 122/62 (11/12 1151) SpO2:  [89 %-98 %] 98 % (11/12 1151) Weight:  [85.7 kg] 85.7 kg (11/12 0445)  Weight change:  Filed Weights   09/22/21 1817 09/23/21 2136 09/26/21 0445  Weight: 82.1 kg 79.8 kg 85.7 kg    Intake/Output: I/O last 3 completed shifts: In: 120 [P.O.:120] Out: -    Intake/Output this shift:  No intake/output data recorded.  Physical Exam: General:  No acute distress  Head:  Normocephalic, atraumatic. Moist oral mucosal membranes  Eyes:  Anicteric  Neck:  Supple  Lungs:   Clear to auscultation, normal effort  Heart:  S1S2 no rubs  Abdomen:   Soft, nontender, bowel sounds present  Extremities:  peripheral edema.  Neurologic:  Awake, alert, following commands  Skin:  No lesions  Access:     Basic Metabolic Panel: Recent Labs  Lab 09/21/21 0225 09/22/21 0646 09/24/21 0346 09/26/21 0415  NA 134* 129* 127* 130*  K 3.5 3.2* 3.3* 3.2*  CL 94* 92* 93* 93*  CO2 25 25 25 27   GLUCOSE 125* 219* 226* 305*  BUN 33* 42* 44* 51*  CREATININE 7.53* 8.05* 8.06* 7.90*  CALCIUM 8.9 8.2* 7.7* 8.3*  MG  --   --   --  1.7  PHOS  --   --  7.1*  --     CBC: Recent Labs  Lab 09/21/21 0225 09/22/21 0646 09/24/21 0346 09/26/21 0415  WBC 12.9* 9.9 7.0 7.8  NEUTROABS 10.2*  --   --   --   HGB 13.1 11.2* 11.0* 10.6*  HCT 39.3 34.3* 32.8* 31.9*  MCV 92.0 92.0 91.6 91.4  PLT 276 240 241 252     Urinalysis: No results for input(s): COLORURINE, LABSPEC, PHURINE, GLUCOSEU, HGBUR, BILIRUBINUR, KETONESUR, PROTEINUR, UROBILINOGEN, NITRITE, LEUKOCYTESUR in the last 72  hours.  Invalid input(s): APPERANCEUR    Imaging: No results found.   Medications:    dialysis solution 1.5% low-MG/low-CA     promethazine (PHENERGAN) injection (IM or IVPB)      (feeding supplement) PROSource Plus  30 mL Oral Daily   ALPRAZolam  0.5 mg Oral QHS   alteplase       atorvastatin  20 mg Oral Q supper   feeding supplement (NEPRO CARB STEADY)  237 mL Oral Q24H   furosemide  80 mg Oral Daily   gentamicin cream  1 application Topical Daily   insulin aspart  0-9 Units Subcutaneous TID WC   insulin aspart  6 Units Subcutaneous TID WC   insulin NPH Human  12 Units Subcutaneous BID AC & HS   metoprolol succinate  25 mg Oral Q breakfast   mometasone-formoterol  2 puff Inhalation BID   multivitamin   Oral Daily   pantoprazole  40 mg Oral BID   potassium chloride  40 mEq Oral Daily   spironolactone  25 mg Oral Daily   Vitamin D (Ergocalciferol)  50,000 Units Oral Weekly    Assessment/ Plan:     Active Problems:   [facility]  75 year old female with history of hypertension, COPD,  diabetes, obesity, end-stage renal disease on peritoneal dialysis now admitted with history of fall she is found to have a fracture.  This is a closed fracture hence Ortho recommends physical therapy at the rehab center.  #1: ESRD on PD: Continue peritoneal dialysis treatments.  Patient had Cathflo placed for nondrainage.  Spoke to the patient regarding the possibility of temporary hemodialysis while at the rehab center.  she is resistant at this time but will decide by Monday.  #2: Hypokalemia: Patient has been on diuretics with Lasix and spironolactone.  We will supplement potassium today.  #3: Hypertension: Continue present antihypertensive medications.  #4: Diabetes: Continue present medications.  Spoke to the family at bedside   LOS: [place], [facility] 11/12/20221:53 PM

## 2021-09-26 NOTE — Progress Notes (Signed)
PROGRESS NOTE    Andrea Stewart  JME:[id number] DOB: 09-24-1946 DOA: 09/21/2021 PCP: Rusty Aus, MD  147A/147A-AA   Assessment & Plan:   Active Problems:   Closed right hip fracture (HCC)   Andrea Stewart is a 75 y.o. obese Caucasian female with medical history significant for COPD, depression, hypertension, type 2 diabetes mellitus end-stage renal disease on peritoneal dialysis and obesity, who presented to the emergency room with acute onset of mechanical fall when she was getting in her bedroom to her portable toilet seat and stumbled and fell on her right side with subsequent right shoulder and hip pain.  Imaging showed a nondisplaced fracture of the lateral humeral head and an undisplaced right femoral intratrochanteric fracture.   Mechanical Fall Right nondisplaced femoral intratrochanteric fracture Right Nondisplaced Fracture of Lateral Humeral Head with involvement of the Greater Tuberosity Ortho c/s, appreciate recs, Dr. Sabra Heck recommending nonoperative management  --RUE sling --follow up with ortho in clinic in 2 weeks --oral pain regimen   ESRD on peritoneal dialysis  --cont PD while inpatient   Hypertension --cont lasix, spironolactone and metop   T2DM c/b peripheral neuropathy --had an episode of hypoglycemia --cont Novolin 12u BID --reduce mealtime to 3u TID --SSI TID   Dyslipidemia --cont statin   COPD  --cont daily bronchodilator   Anxiety --cont home Xanax nightly   DVT prophylaxis: SCD/Compression stockings Code Status: Full code  Family Communication:  Level of care: Med-Surg Dispo:   The patient is from: home Anticipated d/c is to: CIR Anticipated d/c date is: whenever bed available Patient currently is medically ready to d/c.   Subjective and Interval History:  Pt's PD has been running into problems constantly in the past several days.  Pt was upset with the prospect of having to do temporary HD.  Reported some loose  stools.   Objective: Vitals:   09/26/21 0600 09/26/21 0735 09/26/21 1151 09/26/21 1534  BP:  100/63 122/62 120/60  Pulse:  82 63 68  Resp:  18 17 17   Temp: 98 F (36.7 C) 98.2 F (36.8 C) 98.4 F (36.9 C) 97.9 F (36.6 C)  TempSrc: Oral Oral Oral   SpO2:  96% 98% 93%  Weight:      Height:       No intake or output data in the 24 hours ending 09/26/21 1651  Filed Weights   09/22/21 1817 09/23/21 2136 09/26/21 0445  Weight: 82.1 kg 79.8 kg 85.7 kg    Examination:   Constitutional: NAD, AAOx3 HEENT: conjunctivae and lids normal, EOMI CV: No cyanosis.   RESP: normal respiratory effort, on RA GI: abdomen distended SKIN: warm, dry Neuro: II - XII grossly intact.   Psych: grouchy mood and affect.  Appropriate judgement and reason   Data Reviewed: I have personally reviewed following labs and imaging studies  CBC: Recent Labs  Lab 09/21/21 0225 09/22/21 0646 09/24/21 0346 09/26/21 0415  WBC 12.9* 9.9 7.0 7.8  NEUTROABS 10.2*  --   --   --   HGB 13.1 11.2* 11.0* 10.6*  HCT 39.3 34.3* 32.8* 31.9*  MCV 92.0 92.0 91.6 91.4  PLT 276 240 241 229   Basic Metabolic Panel: Recent Labs  Lab 09/21/21 0225 09/22/21 0646 09/24/21 0346 09/26/21 0415  NA 134* 129* 127* 130*  K 3.5 3.2* 3.3* 3.2*  CL 94* 92* 93* 93*  CO2 25 25 25 27   GLUCOSE 125* 219* 226* 305*  BUN 33* 42* 44* 51*  CREATININE 7.53* 8.05* 8.06*  7.90*  CALCIUM 8.9 8.2* 7.7* 8.3*  MG  --   --   --  1.7  PHOS  --   --  7.1*  --    GFR: Estimated Creatinine Clearance: 5.8 mL/min (A) (by C-G formula based on SCr of 7.9 mg/dL (H)). Liver Function Tests: Recent Labs  Lab 09/24/21 0346  ALBUMIN 1.8*   No results for input(s): LIPASE, AMYLASE in the last 168 hours. No results for input(s): AMMONIA in the last 168 hours. Coagulation Profile: Recent Labs  Lab 09/21/21 0225  INR 1.0   Cardiac Enzymes: No results for input(s): CKTOTAL, CKMB, CKMBINDEX, TROPONINI in the last 168 hours. BNP (last 3  results) No results for input(s): PROBNP in the last 8760 hours. HbA1C: No results for input(s): HGBA1C in the last 72 hours.  CBG: Recent Labs  Lab 09/25/21 1638 09/25/21 2157 09/26/21 0728 09/26/21 1149 09/26/21 1641  GLUCAP 208* 79 343* 264* 55*   Lipid Profile: No results for input(s): CHOL, HDL, LDLCALC, TRIG, CHOLHDL, LDLDIRECT in the last 72 hours. Thyroid Function Tests: No results for input(s): TSH, T4TOTAL, FREET4, T3FREE, THYROIDAB in the last 72 hours. Anemia Panel: No results for input(s): VITAMINB12, FOLATE, FERRITIN, TIBC, IRON, RETICCTPCT in the last 72 hours. Sepsis Labs: No results for input(s): PROCALCITON, LATICACIDVEN in the last 168 hours.  Recent Results (from the past 240 hour(s))  Resp Panel by RT-PCR (Flu A&B, Covid) Nasopharyngeal Swab     Status: None   Collection Time: 09/21/21  2:25 AM   Specimen: Nasopharyngeal Swab; Nasopharyngeal(NP) swabs in vial transport medium  Result Value Ref Range Status   SARS Coronavirus 2 by RT PCR NEGATIVE NEGATIVE Final    Comment: (NOTE) SARS-CoV-2 target nucleic acids are NOT DETECTED.  The SARS-CoV-2 RNA is generally detectable in upper respiratory specimens during the acute phase of infection. The lowest concentration of SARS-CoV-2 viral copies this assay can detect is 138 copies/mL. A negative result does not preclude SARS-Cov-2 infection and should not be used as the sole basis for treatment or other patient management decisions. A negative result may occur with  improper specimen collection/handling, submission of specimen other than nasopharyngeal swab, presence of viral mutation(s) within the areas targeted by this assay, and inadequate number of viral copies(<138 copies/mL). A negative result must be combined with clinical observations, patient history, and epidemiological information. The expected result is Negative.  Fact Sheet for Patients:  EntrepreneurPulse.com.au  Fact Sheet  for Healthcare Providers:  IncredibleEmployment.be  This test is no t yet approved or cleared by the [place] FDA and  has been authorized for detection and/or diagnosis of SARS-CoV-2 by FDA under an Emergency Use Authorization (EUA). This EUA will remain  in effect (meaning this test can be used) for the duration of the COVID-19 declaration under Section 564(b)(1) of the Act, 21 U.S.C.section 360bbb-3(b)(1), unless the authorization is terminated  or revoked sooner.       Influenza A by PCR NEGATIVE NEGATIVE Final   Influenza B by PCR NEGATIVE NEGATIVE Final    Comment: (NOTE) The Xpert Xpress SARS-CoV-2/FLU/RSV plus assay is intended as an aid in the diagnosis of influenza from Nasopharyngeal swab specimens and should not be used as a sole basis for treatment. Nasal washings and aspirates are unacceptable for Xpert Xpress SARS-CoV-2/FLU/RSV testing.  Fact Sheet for Patients: EntrepreneurPulse.com.au  Fact Sheet for Healthcare Providers: IncredibleEmployment.be  This test is not yet approved or cleared by the [place] FDA and has been authorized for detection and/or  diagnosis of SARS-CoV-2 by FDA under an Emergency Use Authorization (EUA). This EUA will remain in effect (meaning this test can be used) for the duration of the COVID-19 declaration under Section 564(b)(1) of the Act, 21 U.S.C. section 360bbb-3(b)(1), unless the authorization is terminated or revoked.  Performed at [facility], [address], [place], [place] [address]   Surgical pcr screen     Status: None   Collection Time: 09/21/21 10:15 AM   Specimen: Nasal Mucosa; Nasal Swab  Result Value Ref Range Status   MRSA, PCR NEGATIVE NEGATIVE Final   Staphylococcus aureus NEGATIVE NEGATIVE Final    Comment: (NOTE) The Xpert SA Assay (FDA approved for NASAL specimens in patients 60 years of age and older), is one component of a  comprehensive surveillance program. It is not intended to diagnose infection nor to guide or monitor treatment. Performed at [facility], [address] [place], [place] [address]       Radiology Studies: No results found.   Scheduled Meds:  (feeding supplement) PROSource Plus  30 mL Oral Daily   ALPRAZolam  0.5 mg Oral QHS   alteplase       atorvastatin  20 mg Oral Q supper   feeding supplement (NEPRO CARB STEADY)  237 mL Oral Q24H   furosemide  80 mg Oral Daily   gentamicin cream  1 application Topical Daily   insulin aspart  0-9 Units Subcutaneous TID WC   insulin aspart  6 Units Subcutaneous TID WC   insulin NPH Human  12 Units Subcutaneous BID AC & HS   metoprolol succinate  25 mg Oral Q breakfast   mometasone-formoterol  2 puff Inhalation BID   multivitamin   Oral Daily   pantoprazole  40 mg Oral BID   potassium chloride  40 mEq Oral Daily   spironolactone  25 mg Oral Daily   Vitamin D (Ergocalciferol)  50,000 Units Oral Weekly   Continuous Infusions:  dialysis solution 1.5% low-MG/low-CA     promethazine (PHENERGAN) injection (IM or IVPB)       LOS: 5 days     Enzo Bi, MD [facility] If 7PM-7AM, please contact night-coverage 09/26/2021, 4:51 PM

## 2021-09-27 LAB — BASIC METABOLIC PANEL
Anion gap: 6 (ref 5–15)
BUN: 51 mg/dL — ABNORMAL HIGH (ref 8–23)
CO2: 27 mmol/L (ref 22–32)
Calcium: 8 mg/dL — ABNORMAL LOW (ref 8.9–10.3)
Chloride: 96 mmol/L — ABNORMAL LOW (ref 98–111)
Creatinine, Ser: 7.15 mg/dL — ABNORMAL HIGH (ref 0.44–1.00)
GFR, Estimated: 6 mL/min — ABNORMAL LOW (ref >60)
Glucose, Bld: 211 mg/dL — ABNORMAL HIGH (ref 70–99)
Potassium: 2.9 mmol/L — ABNORMAL LOW (ref 3.5–5.1)
Sodium: 129 mmol/L — ABNORMAL LOW (ref 135–145)

## 2021-09-27 LAB — GLUCOSE, CAPILLARY
Glucose-Capillary: 200 mg/dL — ABNORMAL HIGH (ref 70–99)
Glucose-Capillary: 191 mg/dL — ABNORMAL HIGH (ref 70–99)
Glucose-Capillary: 62 mg/dL — ABNORMAL LOW (ref 70–99)
Glucose-Capillary: 215 mg/dL — ABNORMAL HIGH (ref 70–99)

## 2021-09-27 LAB — CBC
HCT: 29.1 % — ABNORMAL LOW (ref 36.0–46.0)
Hemoglobin: 9.7 g/dL — ABNORMAL LOW (ref 12.0–15.0)
MCH: 30.1 pg (ref 26.0–34.0)
MCHC: 33.3 g/dL (ref 30.0–36.0)
MCV: 90.4 fL (ref 80.0–100.0)
Platelets: 249 10*3/uL (ref 150–400)
RBC: 3.22 MIL/uL — ABNORMAL LOW (ref 3.87–5.11)
RDW: 13.8 % (ref 11.5–15.5)
WBC: 6.3 10*3/uL (ref 4.0–10.5)
nRBC: 0 % (ref 0.0–0.2)

## 2021-09-27 LAB — MAGNESIUM: Magnesium: 1.5 mg/dL — ABNORMAL LOW (ref 1.7–2.4)

## 2021-09-27 MED ORDER — POTASSIUM CHLORIDE CRYS ER 20 MEQ PO TBCR
40.0000 meq | EXTENDED_RELEASE_TABLET | Freq: Once | ORAL | Status: AC
  Administered 2021-09-27: 40 meq via ORAL
  Filled 2021-09-27: qty 2

## 2021-09-27 MED ORDER — MAGNESIUM SULFATE 2 GM/50ML IV SOLN: 2.0000 g | Freq: Once | INTRAVENOUS | Status: DC

## 2021-09-27 MED ORDER — INSULIN DETEMIR 100 UNIT/ML ~~LOC~~ SOLN
8.0000 [IU] | Freq: Two times a day (BID) | SUBCUTANEOUS | Status: DC
Start: 2021-09-27 — End: 2021-09-28
  Administered 2021-09-27 – 2021-09-28 (×3): 8 [IU] via SUBCUTANEOUS
  Filled 2021-09-27 (×4): qty 0.08

## 2021-09-27 MED ORDER — INSULIN ASPART 100 UNIT/ML IJ SOLN
3.0000 [IU] | Freq: Three times a day (TID) | INTRAMUSCULAR | Status: DC
Start: 2021-09-27 — End: 2021-09-27
  Administered 2021-09-27: 3 [IU] via SUBCUTANEOUS
  Filled 2021-09-27: qty 1

## 2021-09-27 MED ORDER — ALTEPLASE 2 MG IJ SOLR
INTRAMUSCULAR | Status: AC
  Administered 2021-09-27: 2 mg via INTRAPERITONEAL
  Filled 2021-09-27: qty 2

## 2021-09-27 MED ORDER — MAGNESIUM OXIDE -MG SUPPLEMENT 400 (240 MG) MG PO TABS
400.0000 mg | ORAL_TABLET | Freq: Two times a day (BID) | ORAL | Status: DC
  Administered 2021-09-27 – 2021-09-28 (×3): 400 mg via ORAL
  Filled 2021-09-27 (×3): qty 1

## 2021-09-27 NOTE — Progress Notes (Signed)
PROGRESS NOTE    Andrea Stewart  URK:[id number] DOB: Jun 12, 1946 DOA: 09/21/2021 PCP: Rusty Aus, MD  147A/147A-AA   Assessment & Plan:   Active Problems:   Closed right hip fracture (HCC)   Andrea Stewart is a 75 y.o. obese Caucasian female with medical history significant for COPD, depression, hypertension, type 2 diabetes mellitus end-stage renal disease on peritoneal dialysis and obesity, who presented to the emergency room with acute onset of mechanical fall when she was getting in her bedroom to her portable toilet seat and stumbled and fell on her right side with subsequent right shoulder and hip pain.  Imaging showed a nondisplaced fracture of the lateral humeral head and an undisplaced right femoral intratrochanteric fracture.   Mechanical Fall Right nondisplaced femoral intratrochanteric fracture Right Nondisplaced Fracture of Lateral Humeral Head with involvement of the Greater Tuberosity Ortho c/s, appreciate recs, Dr. Sabra Heck recommending nonoperative management  --RUE sling --follow up with ortho in clinic in 2 weeks --oral pain regimen --d/c to CIR   ESRD on peritoneal dialysis  --PD cath finally starting working well --cont PD    Hypertension --well controlled on current regimen --cont lasix, spironolactone and metop   T2DM c/b peripheral neuropathy --recurrent hypoglycemia during evening checks. Plan: --d/c Novolin 12u BID --start Levemir 8u BID tonight --SSI TID for now   Dyslipidemia --cont statin   COPD  --cont daily bronchodilator   Anxiety --cont home Xanax nightly   DVT prophylaxis: SCD/Compression stockings Code Status: Full code  Family Communication:  Level of care: Med-Surg Dispo:   The patient is from: home Anticipated d/c is to: CIR Anticipated d/c date is: whenever bed available Patient currently is medically ready to d/c.   Subjective and Interval History:  PD cath worked overnight.  Pt was much happier  today.   Objective: Vitals:   09/27/21 0554 09/27/21 0801 09/27/21 1109 09/27/21 1145  BP: 103/62 112/72 125/79   Pulse: 83 (!) 106 (!) 112 98  Resp: 18 16 18    Temp: 97.8 F (36.6 C) (!) 97.5 F (36.4 C) 97.8 F (36.6 C)   TempSrc:      SpO2: 98% 95% 97%   Weight:      Height:        Intake/Output Summary (Last 24 hours) at 09/27/2021 1504 Last data filed at 09/27/2021 0939 Gross per 24 hour  Intake 10000 ml  Output 1331 ml  Net 8669 ml    Filed Weights   09/23/21 2136 09/26/21 0445 09/27/21 0400  Weight: 79.8 kg 85.7 kg 81.2 kg    Examination:   Constitutional: NAD, AAOx3 HEENT: conjunctivae and lids normal, EOMI CV: No cyanosis.   RESP: normal respiratory effort, on RA Neuro: II - XII grossly intact.   Psych: better mood and affect today.  Appropriate judgement and reason   Data Reviewed: I have personally reviewed following labs and imaging studies  CBC: Recent Labs  Lab 09/21/21 0225 09/22/21 0646 09/24/21 0346 09/26/21 0415 09/27/21 0338  WBC 12.9* 9.9 7.0 7.8 6.3  NEUTROABS 10.2*  --   --   --   --   HGB 13.1 11.2* 11.0* 10.6* 9.7*  HCT 39.3 34.3* 32.8* 31.9* 29.1*  MCV 92.0 92.0 91.6 91.4 90.4  PLT 276 240 241 252 831   Basic Metabolic Panel: Recent Labs  Lab 09/21/21 0225 09/22/21 0646 09/24/21 0346 09/26/21 0415 09/27/21 0338  NA 134* 129* 127* 130* 129*  K 3.5 3.2* 3.3* 3.2* 2.9*  CL 94* 92*  93* 93* 96*  CO2 25 25 25 27 27   GLUCOSE 125* 219* 226* 305* 211*  BUN 33* 42* 44* 51* 51*  CREATININE 7.53* 8.05* 8.06* 7.90* 7.15*  CALCIUM 8.9 8.2* 7.7* 8.3* 8.0*  MG  --   --   --  1.7 1.5*  PHOS  --   --  7.1*  --   --    GFR: Estimated Creatinine Clearance: 6.2 mL/min (A) (by C-G formula based on SCr of 7.15 mg/dL (H)). Liver Function Tests: Recent Labs  Lab 09/24/21 0346  ALBUMIN 1.8*   No results for input(s): LIPASE, AMYLASE in the last 168 hours. No results for input(s): AMMONIA in the last 168 hours. Coagulation  Profile: Recent Labs  Lab 09/21/21 0225  INR 1.0   Cardiac Enzymes: No results for input(s): CKTOTAL, CKMB, CKMBINDEX, TROPONINI in the last 168 hours. BNP (last 3 results) No results for input(s): PROBNP in the last 8760 hours. HbA1C: No results for input(s): HGBA1C in the last 72 hours.  CBG: Recent Labs  Lab 09/26/21 1702 09/26/21 2106 09/26/21 2159 09/27/21 0801 09/27/21 1112  GLUCAP 70 154* 191* 200* 191*   Lipid Profile: No results for input(s): CHOL, HDL, LDLCALC, TRIG, CHOLHDL, LDLDIRECT in the last 72 hours. Thyroid Function Tests: No results for input(s): TSH, T4TOTAL, FREET4, T3FREE, THYROIDAB in the last 72 hours. Anemia Panel: No results for input(s): VITAMINB12, FOLATE, FERRITIN, TIBC, IRON, RETICCTPCT in the last 72 hours. Sepsis Labs: No results for input(s): PROCALCITON, LATICACIDVEN in the last 168 hours.  Recent Results (from the past 240 hour(s))  Resp Panel by RT-PCR (Flu A&B, Covid) Nasopharyngeal Swab     Status: None   Collection Time: 09/21/21  2:25 AM   Specimen: Nasopharyngeal Swab; Nasopharyngeal(NP) swabs in vial transport medium  Result Value Ref Range Status   SARS Coronavirus 2 by RT PCR NEGATIVE NEGATIVE Final    Comment: (NOTE) SARS-CoV-2 target nucleic acids are NOT DETECTED.  The SARS-CoV-2 RNA is generally detectable in upper respiratory specimens during the acute phase of infection. The lowest concentration of SARS-CoV-2 viral copies this assay can detect is 138 copies/mL. A negative result does not preclude SARS-Cov-2 infection and should not be used as the sole basis for treatment or other patient management decisions. A negative result may occur with  improper specimen collection/handling, submission of specimen other than nasopharyngeal swab, presence of viral mutation(s) within the areas targeted by this assay, and inadequate number of viral copies(<138 copies/mL). A negative result must be combined with clinical  observations, patient history, and epidemiological information. The expected result is Negative.  Fact Sheet for Patients:  EntrepreneurPulse.com.au  Fact Sheet for Healthcare Providers:  IncredibleEmployment.be  This test is no t yet approved or cleared by the [place] FDA and  has been authorized for detection and/or diagnosis of SARS-CoV-2 by FDA under an Emergency Use Authorization (EUA). This EUA will remain  in effect (meaning this test can be used) for the duration of the COVID-19 declaration under Section 564(b)(1) of the Act, 21 U.S.C.section 360bbb-3(b)(1), unless the authorization is terminated  or revoked sooner.       Influenza A by PCR NEGATIVE NEGATIVE Final   Influenza B by PCR NEGATIVE NEGATIVE Final    Comment: (NOTE) The Xpert Xpress SARS-CoV-2/FLU/RSV plus assay is intended as an aid in the diagnosis of influenza from Nasopharyngeal swab specimens and should not be used as a sole basis for treatment. Nasal washings and aspirates are unacceptable for Xpert Xpress SARS-CoV-2/FLU/RSV  testing.  Fact Sheet for Patients: EntrepreneurPulse.com.au  Fact Sheet for Healthcare Providers: IncredibleEmployment.be  This test is not yet approved or cleared by the [place] FDA and has been authorized for detection and/or diagnosis of SARS-CoV-2 by FDA under an Emergency Use Authorization (EUA). This EUA will remain in effect (meaning this test can be used) for the duration of the COVID-19 declaration under Section 564(b)(1) of the Act, 21 U.S.C. section 360bbb-3(b)(1), unless the authorization is terminated or revoked.  Performed at [facility], [address], [place], [place] [address]   Surgical pcr screen     Status: None   Collection Time: 09/21/21 10:15 AM   Specimen: Nasal Mucosa; Nasal Swab  Result Value Ref Range Status   MRSA, PCR NEGATIVE NEGATIVE Final    Staphylococcus aureus NEGATIVE NEGATIVE Final    Comment: (NOTE) The Xpert SA Assay (FDA approved for NASAL specimens in patients 81 years of age and older), is one component of a comprehensive surveillance program. It is not intended to diagnose infection nor to guide or monitor treatment. Performed at [facility], [address], [place], [place] [address]       Radiology Studies: No results found.   Scheduled Meds:  (feeding supplement) PROSource Plus  30 mL Oral Daily   ALPRAZolam  0.5 mg Oral QHS   atorvastatin  20 mg Oral Q supper   feeding supplement (NEPRO CARB STEADY)  237 mL Oral Q24H   furosemide  80 mg Oral Daily   gentamicin cream  1 application Topical Daily   insulin aspart  0-9 Units Subcutaneous TID WC   insulin NPH Human  12 Units Subcutaneous BID AC & HS   magnesium oxide  400 mg Oral BID   metoprolol succinate  25 mg Oral Q breakfast   mometasone-formoterol  2 puff Inhalation BID   multivitamin   Oral Daily   pantoprazole  40 mg Oral BID   potassium chloride  40 mEq Oral Daily   spironolactone  25 mg Oral Daily   Vitamin D (Ergocalciferol)  50,000 Units Oral Weekly   Continuous Infusions:  dialysis solution 1.5% low-MG/low-CA     promethazine (PHENERGAN) injection (IM or IVPB)       LOS: 6 days     Enzo Bi, MD [facility] If 7PM-7AM, please contact night-coverage 09/27/2021, 3:04 PM

## 2021-09-27 NOTE — Progress Notes (Signed)
Inpatient Rehabilitation Admissions Coordinator   I will follow up Monday after CIR huddle at 0930 with our MD to clarify when bed will be available for her admit. [facility] provides peritoneal dialysis with [place], RN, MSN Rehab Admissions Coordinator [phone] 09/27/2021 6:09 PM

## 2021-09-27 NOTE — Progress Notes (Signed)
Physical Therapy Treatment Patient Details Name: OLUWATENIOLA LEITCH MRN: [id number] DOB: 30-Jun-1946 Today's Date: 09/27/2021   History of Present Illness Andrea Stewart is a 75 y.o. Caucasian female with medical history significant for COPD, depression, hypertension, type 2 diabetes mellitus end-stage renal disease on peritoneal dialysis and obesity, who presented to the emergency room following mechanical fall getting to her BSC and fell on her right side with subsequent right shoulder and hip pain.  Imaging showed a nondisplaced fracture of the lateral humeral head and an undisplaced right femoral intratrochanteric fracture.    PT Comments    Up chair,  reports poor sleep during dialysis last night and general fatigue today but puts good effort into session but does appear somewhat limited based on prior sessions.  Stated lack of rest is catching up to her.  Stood x 2 with cues to push from armrest and light min assist to stand.  Time given for posture and pre-gait activities.  Marching in place x 1 with decreased step height and stance time.  Unsafe to attempt gait with +1 assist today. Participated in exercises as described below.  Encouragement given as she does voice some frustration with her and spouses medical condition and troubles.  Stated she continues to hope to transition to CIR with husband so they can both return home.   Recommendations for follow up therapy are one component of a multi-disciplinary discharge planning process, led by the attending physician.  Recommendations may be updated based on patient status, additional functional criteria and insurance authorization.  Follow Up Recommendations  Acute inpatient rehab (3hours/day)     Assistance Recommended at Discharge    Equipment Recommendations  Other (comment)    Recommendations for Other Services       Precautions / Restrictions Precautions Precautions: Fall Precaution Comments: WBAT on R LE and R UE, R UE  remained in sling for duration of therapy session and opted to keep it on after. Required Braces or Orthoses: Sling Restrictions Weight Bearing Restrictions: Yes RUE Weight Bearing: Weight bearing as tolerated LUE Weight Bearing: Weight bearing as tolerated RLE Weight Bearing: Weight bearing as tolerated LLE Weight Bearing: Weight bearing as tolerated Other Position/Activity Restrictions: RUE sling for comfort ( pt preferred to use RUE in sling during OOB activity)     Mobility  Bed Mobility               General bed mobility comments: in chair with nursing staff prior    Transfers Overall transfer level: Needs assistance Equipment used: Hemi-walker Transfers: Sit to/from Stand Sit to Stand: Min assist           General transfer comment: increased time and attempts but able to stand x 2 today with only light assist and cues for hand placements.    Ambulation/Gait               General Gait Details: deferred due to weakness and fatigue today.  poor ability to step in place.   Stairs             Wheelchair Mobility    Modified Rankin (Stroke Patients Only)       Balance Overall balance assessment: Needs assistance Sitting-balance support: No upper extremity supported;Feet supported Sitting balance-Leahy Scale: Good     Standing balance support: During functional activity;Reliant on assistive device for balance;Single extremity supported Standing balance-Leahy Scale: Poor Standing balance comment: poor balance with only +1 UE support dueing dynamic activity  Cognition Arousal/Alertness: Awake/alert Behavior During Therapy: WFL for tasks assessed/performed Overall Cognitive Status: Within Functional Limits for tasks assessed                                          Exercises Other Exercises Other Exercises: seated arom x 10 BLE, standing marching in place with dec step height and stance  time    General Comments        Pertinent Vitals/Pain Pain Assessment: Faces Faces Pain Scale: Hurts even more Pain Location: R shoulder, R hip Pain Descriptors / Indicators: Discomfort;Sore Pain Intervention(s): Limited activity within patient's tolerance;Monitored during session;Repositioned    Home Living                          Prior Function            PT Goals (current goals can now be found in the care plan section) Progress towards PT goals: Progressing toward goals    Frequency    7X/week      PT Plan Current plan remains appropriate    Co-evaluation              AM-PAC PT "6 Clicks" Mobility   Outcome Measure  Help needed turning from your back to your side while in a flat bed without using bedrails?: A Little Help needed moving from lying on your back to sitting on the side of a flat bed without using bedrails?: A Little Help needed moving to and from a bed to a chair (including a wheelchair)?: A Lot Help needed standing up from a chair using your arms (e.g., wheelchair or bedside chair)?: A Lot Help needed to walk in hospital room?: A Lot Help needed climbing 3-5 steps with a railing? : Total 6 Click Score: 13    End of Session Equipment Utilized During Treatment: Gait belt Activity Tolerance: Patient limited by pain;Patient limited by fatigue Patient left: in chair;with call bell/phone within reach;with chair alarm set Nurse Communication: Mobility status PT Visit Diagnosis: Unsteadiness on feet (R26.81);Other abnormalities of gait and mobility (R26.89);Repeated falls (R29.6);Muscle weakness (generalized) (M62.81);History of falling (Z91.81);Difficulty in walking, not elsewhere classified (R26.2)     Time: 5686-1683 PT Time Calculation (min) (ACUTE ONLY): 14 min  Charges:  $Therapeutic Activity: 8-22 mins                    Chesley Noon, PTA 09/27/21, 10:41 AM

## 2021-09-27 NOTE — Progress Notes (Signed)
[facility]  PROGRESS NOTE   Subjective:   She feels much better today. PD catheter has been working very well.  Objective:  Vital signs in last 24 hours:  Temp:  [97.8 F (36.6 C)-98 F (36.7 C)] 97.8 F (36.6 C) (11/13 0554) Pulse Rate:  [68-99] 83 (11/13 0554) Resp:  [16-18] 18 (11/13 0554) BP: (102-120)/(60-63) 103/62 (11/13 0554) SpO2:  [93 %-98 %] 98 % (11/13 0554) Weight:  [81.2 kg] 81.2 kg (11/13 0400)  Weight change: -4.506 kg Filed Weights   09/23/21 2136 09/26/21 0445 09/27/21 0400  Weight: 79.8 kg 85.7 kg 81.2 kg    Intake/Output: No intake/output data recorded.   Intake/Output this shift:  Total I/O In: 10000 [Other:10000] Out: 1331 [Other:1330; Stool:1]  Physical Exam: General:  No acute distress  Head:  Normocephalic, atraumatic. Moist oral mucosal membranes  Eyes:  Anicteric  Neck:  Supple  Lungs:   Clear to auscultation, normal effort  Heart:  S1S2 no rubs  Abdomen:   Soft, nontender, bowel sounds present  Extremities:  peripheral edema.  Neurologic:  Awake, alert, following commands  Skin:  No lesions  Access:     Basic Metabolic Panel: Recent Labs  Lab 09/21/21 0225 09/22/21 0646 09/24/21 0346 09/26/21 0415 09/27/21 0338  NA 134* 129* 127* 130* 129*  K 3.5 3.2* 3.3* 3.2* 2.9*  CL 94* 92* 93* 93* 96*  CO2 25 25 25 27 27   GLUCOSE 125* 219* 226* 305* 211*  BUN 33* 42* 44* 51* 51*  CREATININE 7.53* 8.05* 8.06* 7.90* 7.15*  CALCIUM 8.9 8.2* 7.7* 8.3* 8.0*  MG  --   --   --  1.7 1.5*  PHOS  --   --  7.1*  --   --     CBC: Recent Labs  Lab 09/21/21 0225 09/22/21 0646 09/24/21 0346 09/26/21 0415 09/27/21 0338  WBC 12.9* 9.9 7.0 7.8 6.3  NEUTROABS 10.2*  --   --   --   --   HGB 13.1 11.2* 11.0* 10.6* 9.7*  HCT 39.3 34.3* 32.8* 31.9* 29.1*  MCV 92.0 92.0 91.6 91.4 90.4  PLT 276 240 241 252 249     Urinalysis: No results for input(s): COLORURINE, LABSPEC, PHURINE, GLUCOSEU, HGBUR, BILIRUBINUR, KETONESUR,  PROTEINUR, UROBILINOGEN, NITRITE, LEUKOCYTESUR in the last 72 hours.  Invalid input(s): APPERANCEUR    Imaging: No results found.   Medications:    dialysis solution 1.5% low-MG/low-CA     promethazine (PHENERGAN) injection (IM or IVPB)      (feeding supplement) PROSource Plus  30 mL Oral Daily   ALPRAZolam  0.5 mg Oral QHS   atorvastatin  20 mg Oral Q supper   feeding supplement (NEPRO CARB STEADY)  237 mL Oral Q24H   furosemide  80 mg Oral Daily   gentamicin cream  1 application Topical Daily   insulin aspart  0-9 Units Subcutaneous TID WC   insulin NPH Human  12 Units Subcutaneous BID AC & HS   magnesium oxide  400 mg Oral BID   metoprolol succinate  25 mg Oral Q breakfast   mometasone-formoterol  2 puff Inhalation BID   multivitamin   Oral Daily   pantoprazole  40 mg Oral BID   potassium chloride  40 mEq Oral Daily   spironolactone  25 mg Oral Daily   Vitamin D (Ergocalciferol)  50,000 Units Oral Weekly    Assessment/ Plan:     Active Problems:   Closed right hip fracture [facility])  75 year old female  with history of hypertension, COPD, diabetes, obesity, end-stage renal disease on peritoneal dialysis now admitted with history of fall she is found to have a fracture.  This is a closed fracture hence Ortho recommends physical therapy at the rehab center.   #1: ESRD on PD: Continue peritoneal dialysis treatments.  Patient had Cathflo placed for nondrainage.  Spoke to the patient regarding the possibility of temporary hemodialysis while at the rehab center. she is resistant at this time but will decide by Monday.   #2: Hypokalemia: Patient has been on diuretics with Lasix and spironolactone.  We will supplement potassium today.   #3: Hypertension: Continue present antihypertensive medications.   #4: Diabetes: Continue present medications.   Spoke to the family at bedside   LOS: [place], [facility] 11/13/202212:45 PM

## 2021-09-28 LAB — GLUCOSE, CAPILLARY
Glucose-Capillary: 284 mg/dL — ABNORMAL HIGH (ref 70–99)
Glucose-Capillary: 249 mg/dL — ABNORMAL HIGH (ref 70–99)
Glucose-Capillary: 341 mg/dL — ABNORMAL HIGH (ref 70–99)
Glucose-Capillary: 364 mg/dL — ABNORMAL HIGH (ref 70–99)
Glucose-Capillary: 271 mg/dL — ABNORMAL HIGH (ref 70–99)
Glucose-Capillary: 234 mg/dL — ABNORMAL HIGH (ref 70–99)

## 2021-09-28 LAB — CBC
HCT: 30.5 % — ABNORMAL LOW (ref 36.0–46.0)
Hemoglobin: 10.2 g/dL — ABNORMAL LOW (ref 12.0–15.0)
MCH: 31.1 pg (ref 26.0–34.0)
MCHC: 33.4 g/dL (ref 30.0–36.0)
MCV: 93 fL (ref 80.0–100.0)
Platelets: 269 10*3/uL (ref 150–400)
RBC: 3.28 MIL/uL — ABNORMAL LOW (ref 3.87–5.11)
RDW: 14.5 % (ref 11.5–15.5)
WBC: 5.8 10*3/uL (ref 4.0–10.5)
nRBC: 0 % (ref 0.0–0.2)

## 2021-09-28 LAB — BASIC METABOLIC PANEL
Anion gap: 7 (ref 5–15)
BUN: 42 mg/dL — ABNORMAL HIGH (ref 8–23)
CO2: 27 mmol/L (ref 22–32)
Calcium: 8.3 mg/dL — ABNORMAL LOW (ref 8.9–10.3)
Chloride: 94 mmol/L — ABNORMAL LOW (ref 98–111)
Creatinine, Ser: 7 mg/dL — ABNORMAL HIGH (ref 0.44–1.00)
GFR, Estimated: 6 mL/min — ABNORMAL LOW (ref >60)
Glucose, Bld: 296 mg/dL — ABNORMAL HIGH (ref 70–99)
Potassium: 3.6 mmol/L (ref 3.5–5.1)
Sodium: 128 mmol/L — ABNORMAL LOW (ref 135–145)

## 2021-09-28 LAB — MAGNESIUM: Magnesium: 1.6 mg/dL — ABNORMAL LOW (ref 1.7–2.4)

## 2021-09-28 MED ORDER — MAGNESIUM OXIDE -MG SUPPLEMENT 400 (240 MG) MG PO TABS
800.0000 mg | ORAL_TABLET | Freq: Two times a day (BID) | ORAL | Status: DC
  Administered 2021-09-28 – 2021-09-30 (×4): 800 mg via ORAL
  Filled 2021-09-28 (×4): qty 2

## 2021-09-28 MED ORDER — INSULIN DETEMIR 100 UNIT/ML ~~LOC~~ SOLN
10.0000 [IU] | Freq: Two times a day (BID) | SUBCUTANEOUS | Status: DC
Start: 2021-09-29 — End: 2021-09-29
  Administered 2021-09-29 (×2): 10 [IU] via SUBCUTANEOUS
  Filled 2021-09-28 (×3): qty 0.1

## 2021-09-28 MED ORDER — ALTEPLASE 2 MG IJ SOLR
INTRAMUSCULAR | Status: AC
  Administered 2021-09-28: 2 mg via INTRAPERITONEAL
  Filled 2021-09-28: qty 2

## 2021-09-28 MED ORDER — INSULIN ASPART 100 UNIT/ML IJ SOLN
4.0000 [IU] | Freq: Three times a day (TID) | INTRAMUSCULAR | Status: DC
  Administered 2021-09-29 – 2021-09-30 (×5): 4 [IU] via SUBCUTANEOUS
  Filled 2021-09-28 (×5): qty 1

## 2021-09-28 NOTE — Progress Notes (Signed)
[facility]  PROGRESS NOTE   Subjective:   PD catheter now functioning well. Peritoneal dialysis last night. No issues.   Patient working with OT when examined.   Patient is worried about her husband who also hospitalized.   Objective:  Vital signs in last 24 hours:  Temp:  [97.7 F (36.5 C)-98.6 F (37 C)] 98.3 F (36.8 C) (11/14 1100) Pulse Rate:  [88-111] 105 (11/14 1100) Resp:  [16-20] 20 (11/14 0700) BP: (111-131)/(70-82) 111/73 (11/14 1100) SpO2:  [93 %-100 %] 100 % (11/14 1100) Weight:  [85.4 kg] 85.4 kg (11/14 0500)  Weight change: 4.206 kg Filed Weights   09/26/21 0445 09/27/21 0400 09/28/21 0500  Weight: 85.7 kg 81.2 kg 85.4 kg    Intake/Output: I/O last 3 completed shifts: In: 10000 [Other:10000] Out: 1331 [Other:1330; Stool:1]   Intake/Output this shift:  Total I/O In: 100 [P.O.:100] Out: -   Physical Exam: General:  No acute distress  Head:  Normocephalic, atraumatic. Moist oral mucosal membranes  Eyes:  Anicteric  Neck:  Supple  Lungs:   Clear to auscultation, normal effort  Heart:  regular  Abdomen:   Soft, nontender, bowel sounds present  Extremities:  No peripheral edema.  Neurologic:  Awake, alert, following commands  Skin:  No lesions  Access: PD catheter    Basic Metabolic Panel: Recent Labs  Lab 09/22/21 0646 09/24/21 0346 09/26/21 0415 09/27/21 0338 09/28/21 0332  NA 129* 127* 130* 129* 128*  K 3.2* 3.3* 3.2* 2.9* 3.6  CL 92* 93* 93* 96* 94*  CO2 25 25 27 27 27   GLUCOSE 219* 226* 305* 211* 296*  BUN 42* 44* 51* 51* 42*  CREATININE 8.05* 8.06* 7.90* 7.15* 7.00*  CALCIUM 8.2* 7.7* 8.3* 8.0* 8.3*  MG  --   --  1.7 1.5* 1.6*  PHOS  --  7.1*  --   --   --      CBC: Recent Labs  Lab 09/22/21 0646 09/24/21 0346 09/26/21 0415 09/27/21 0338 09/28/21 0332  WBC 9.9 7.0 7.8 6.3 5.8  HGB 11.2* 11.0* 10.6* 9.7* 10.2*  HCT 34.3* 32.8* 31.9* 29.1* 30.5*  MCV 92.0 91.6 91.4 90.4 93.0  PLT 240 241 252 249 269       Urinalysis: No results for input(s): COLORURINE, LABSPEC, PHURINE, GLUCOSEU, HGBUR, BILIRUBINUR, KETONESUR, PROTEINUR, UROBILINOGEN, NITRITE, LEUKOCYTESUR in the last 72 hours.  Invalid input(s): APPERANCEUR    Imaging: No results found.   Medications:    dialysis solution 1.5% low-MG/low-CA     promethazine (PHENERGAN) injection (IM or IVPB)      (feeding supplement) PROSource Plus  30 mL Oral Daily   ALPRAZolam  0.5 mg Oral QHS   atorvastatin  20 mg Oral Q supper   feeding supplement (NEPRO CARB STEADY)  237 mL Oral Q24H   furosemide  80 mg Oral Daily   gentamicin cream  1 application Topical Daily   insulin aspart  0-9 Units Subcutaneous TID WC   insulin detemir  8 Units Subcutaneous BID   magnesium oxide  800 mg Oral BID   metoprolol succinate  25 mg Oral Q breakfast   mometasone-formoterol  2 puff Inhalation BID   multivitamin   Oral Daily   pantoprazole  40 mg Oral BID   potassium chloride  40 mEq Oral Daily   spironolactone  25 mg Oral Daily   Vitamin D (Ergocalciferol)  50,000 Units Oral Weekly    Assessment/ Plan:     Active Problems:   Closed  right hip fracture [facility])  [name] is a 75 y.o. white female with end stage renal disease on peritoneal dialysis, hypertension, COPD, diabetes mellitus type II, who is admitted to [facility] on 09/21/2021 for Ventral hernia without obstruction or gangrene [K43.9] Closed right hip fracture ([place]) [S72.001A] Fall, initial encounter [W19.XXXA] Closed intertrochanteric fracture of hip, right, initial encounter ([place]) [S72.141A] Humeral head fracture, right, closed, initial encounter [S42.291A]  [facility] 73kg CCPD 5 cycles, 2 liter fills 9.5 hours.    #1: End stage renal disease on peritoneal dialysis. With complication of dialysis device. Patient got cathflo and now PD catheter working well.  PD treatment last night.  - Schedule peritoneal dialysis for tonight. Orders prepared.    #2: Anemia  of chronic kidney disease: hemoglobin 10.2, normocytic.  - Mircera as an outpatient.   #3. 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 Remains on furosemide.  - Continue potassium chloride and spironolactone.    #4: Hypertension: 111/73. Current regimen of metoprolol, furosemide, and spironolactone.    #5: Diabetes mellitus type II: Hemoglobin A1c of 9.7%.  - Continue glucose control    LOS: 7 Andrea Dana, MD [facility] 11/14/20222:12 PM

## 2021-09-28 NOTE — Progress Notes (Signed)
Inpatient Diabetes Program Recommendations  AACE/ADA: New Consensus Statement on Inpatient Glycemic Control (2015)  Target Ranges:  Prepandial:   less than 140 mg/dL      Peak postprandial:   less than 180 mg/dL (1-2 hours)      Critically ill patients:  140 - 180 mg/dL   Lab Results  Component Value Date   GLUCAP 341 (H) 09/28/2021   HGBA1C 9.7 (H) 09/21/2021    Review of Glycemic Control Results for Andrea Stewart, Andrea Stewart (MRN [id number]) as of 09/28/2021 12:43  Ref. Range 09/28/2021 04:04 09/28/2021 08:05 09/28/2021 11:56 09/28/2021 11:58  Glucose-Capillary Latest Ref Range: 70 - 99 mg/dL 284 (H) 249 (H) Novolog 3 unnits 364 (H) 341 (H) Novolog 7 units   Home DM Meds: NPH Insulin 20 units AM/ 35 units PM   Current Orders: Levemir 8 units BID                           Novolog Sensitive Correction Scale/ SSI (0-9 units) TID AC + HS  Inpatient Diabetes Program Recommendations:   Please consider: -Increase Levemir to 10 units bid -Restart Novolog meal coverage 4 units tid if eats 50% Secure chat sent to Dr. Billie Ruddy.  Thank you, Nani Gasser. Di Jasmer, RN, MSN, CDE  Diabetes Coordinator Inpatient Glycemic Control Team Team Pager [phone] (8am-5pm) 09/28/2021 12:46 PM

## 2021-09-28 NOTE — Progress Notes (Signed)
Inpatient Rehabilitation Admissions Coordinator   I will have a [facility]/inpatient hospital rehab bed for her on Wednesday. I spoke with patient by phone and she is aware. I have alerted acute team and TOC.  Danne Baxter, RN, MSN Rehab Admissions Coordinator [phone] 09/28/2021 10:22 AM

## 2021-09-28 NOTE — Progress Notes (Signed)
Physical Therapy Treatment Patient Details Name: Andrea Stewart MRN: [id number] DOB: [birth year] Today's Date: 09/28/2021   History of Present Illness Andrea Stewart is a 75 y.o. Caucasian female with medical history significant for COPD, depression, hypertension, type 2 diabetes mellitus end-stage renal disease on peritoneal dialysis and obesity, who presented to the emergency room following mechanical fall getting to her BSC and fell on her right side with subsequent right shoulder and hip pain.  Imaging showed a nondisplaced fracture of the lateral humeral head and an undisplaced right femoral intratrochanteric fracture.    PT Comments    Pt ready for session.  Excited for CIR but voices overall frustration over decline in health and husband over the past few years.  She is able to stand with increased time from chair and cues for hand placements.  She is able to march in place today and then take good steps transferring to EOB.  +2 assist is obtained for safe gait and she is able to walk 10' then 5' with HW and slow step to gait pattern.  Limited by general fatigue.   Recommendations for follow up therapy are one component of a multi-disciplinary discharge planning process, led by the attending physician.  Recommendations may be updated based on patient status, additional functional criteria and insurance authorization.  Follow Up Recommendations  Acute inpatient rehab (3hours/day)     Assistance Recommended at Discharge Frequent or constant Supervision/Assistance  Equipment Recommendations       Recommendations for Other Services       Precautions / Restrictions Precautions Precautions: Fall Restrictions Weight Bearing Restrictions: Yes RUE Weight Bearing: Weight bearing as tolerated LUE Weight Bearing: Weight bearing as tolerated RLE Weight Bearing: Weight bearing as tolerated LLE Weight Bearing: Weight bearing as tolerated Other Position/Activity Restrictions: RUE sling  for comfort     Mobility  Bed Mobility               General bed mobility comments: in chair with nursing staff prior    Transfers Overall transfer level: Needs assistance Equipment used: Hemi-walker Transfers: Sit to/from Stand Sit to Stand: Min assist;Min guard Stand pivot transfers: Min assist         General transfer comment: increased time and cues for hand placement each attempt    Ambulation/Gait Ambulation/Gait assistance: Min assist Gait Distance (Feet): 15 Feet Assistive device: Hemi-walker Gait Pattern/deviations: Step-to pattern;Decreased step length - right;Decreased weight shift to right;Antalgic;Trunk flexed Gait velocity: decreased         Stairs             Wheelchair Mobility    Modified Rankin (Stroke Patients Only)       Balance Overall balance assessment: Needs assistance Sitting-balance support: No upper extremity supported;Feet supported Sitting balance-Leahy Scale: Good     Standing balance support: During functional activity;Reliant on assistive device for balance;Single extremity supported Standing balance-Leahy Scale: Poor Standing balance comment: poor balance with only +1 UE support dueing dynamic activity                            Cognition Arousal/Alertness: Awake/alert Behavior During Therapy: WFL for tasks assessed/performed Overall Cognitive Status: Within Functional Limits for tasks assessed                                          Exercises General  Exercises - Upper Extremity Elbow Flexion: Self ROM;10 reps;Seated;Right Elbow Extension: Self ROM;10 reps;Seated;Right Wrist Flexion: 10 reps;Seated;Right;AROM Wrist Extension: 10 reps;Seated;Right;AROM Digit Composite Flexion: 10 reps;Seated;Right;AROM Composite Extension: 10 reps;Seated;Right;AROM Other Exercises Other Exercises: Pt educated re: d/c recs, falls prevention, ECS Other Exercises: UBD, sit<>Stand, SPT,  sitting/standing balance/tolerance, seated therex    General Comments General comments (skin integrity, edema, etc.): skin tear noted to L posterior neck - RN in room to apply dressing prior to donning sling      Pertinent Vitals/Pain Pain Assessment: Faces Faces Pain Scale: Hurts even more Pain Location: R shoulder Pain Descriptors / Indicators: Discomfort;Sore Pain Intervention(s): Limited activity within patient's tolerance;Monitored during session;Repositioned    Home Living                          Prior Function            PT Goals (current goals can now be found in the care plan section) Progress towards PT goals: Progressing toward goals    Frequency    7X/week      PT Plan Current plan remains appropriate    Co-evaluation              AM-PAC PT "6 Clicks" Mobility   Outcome Measure  Help needed turning from your back to your side while in a flat bed without using bedrails?: A Little Help needed moving from lying on your back to sitting on the side of a flat bed without using bedrails?: A Little Help needed moving to and from a bed to a chair (including a wheelchair)?: A Lot Help needed standing up from a chair using your arms (e.g., wheelchair or bedside chair)?: A Lot Help needed to walk in hospital room?: A Lot Help needed climbing 3-5 steps with a railing? : Total 6 Click Score: 13    End of Session Equipment Utilized During Treatment: Gait belt Activity Tolerance: Patient limited by pain;Patient limited by fatigue Patient left: in chair;with call bell/phone within reach;with chair alarm set Nurse Communication: Mobility status PT Visit Diagnosis: Unsteadiness on feet (R26.81);Other abnormalities of gait and mobility (R26.89);Repeated falls (R29.6);Muscle weakness (generalized) (M62.81);History of falling (Z91.81);Difficulty in walking, not elsewhere classified (R26.2)     Time: 7793-9030 PT Time Calculation (min) (ACUTE ONLY): 29  min  Charges:  $Gait Training: 8-22 mins $Therapeutic Exercise: 8-22 mins                    Chesley Noon, PTA 09/28/21, 1:35 PM

## 2021-09-28 NOTE — Progress Notes (Signed)
PROGRESS NOTE    Andrea Stewart  NUU:[id number] DOB: 05-10-1946 DOA: 09/21/2021 PCP: Rusty Aus, MD  147A/147A-AA   Assessment & Plan:   Active Problems:   Closed right hip fracture (HCC)   Andrea Stewart is a 75 y.o. obese Caucasian female with medical history significant for COPD, depression, hypertension, type 2 diabetes mellitus end-stage renal disease on peritoneal dialysis and obesity, who presented to the emergency room with acute onset of mechanical fall when she was getting in her bedroom to her portable toilet seat and stumbled and fell on her right side with subsequent right shoulder and hip pain.  Imaging showed a nondisplaced fracture of the lateral humeral head and an undisplaced right femoral intratrochanteric fracture.   Mechanical Fall Right nondisplaced femoral intratrochanteric fracture Right Nondisplaced Fracture of Lateral Humeral Head with involvement of the Greater Tuberosity Ortho c/s, appreciate recs, Dr. Sabra Heck recommending nonoperative management  --RUE sling --follow up with ortho in clinic in 2 weeks --oral pain regimen --d/c to CIR   ESRD on peritoneal dialysis  --PD cath finally starting working well --cont PD    Hypertension --well controlled on current regimen --cont lasix, spironolactone and metop   T2DM c/b peripheral neuropathy --recurrent hypoglycemia during evening checks. --switched from Novolin to Levemir on 11/13. Plan: --increase Levemir to 10u BID --add mealtime 4u TID --SSI TID for now   Dyslipidemia --cont statin   COPD  --cont daily bronchodilator   Anxiety --cont home Xanax nightly   DVT prophylaxis: SCD/Compression stockings Code Status: Full code  Family Communication:  Level of care: Med-Surg Dispo:   The patient is from: home Anticipated d/c is to: CIR Anticipated d/c date is: whenever bed available, on Wed Patient currently is medically ready to d/c.   Subjective and Interval History:  PD  working well last night.    No new complaint today.     Objective: Vitals:   09/28/21 0500 09/28/21 0700 09/28/21 1100 09/28/21 1539  BP:  118/82 111/73 112/83  Pulse:  88 (!) 105 99  Resp:  20  15  Temp:  98.1 F (36.7 C) 98.3 F (36.8 C) 98.7 F (37.1 C)  TempSrc:  Oral Oral   SpO2:  95% 100% 100%  Weight: 85.4 kg     Height:        Intake/Output Summary (Last 24 hours) at 09/28/2021 1606 Last data filed at 09/28/2021 1407 Gross per 24 hour  Intake 100 ml  Output --  Net 100 ml    Filed Weights   09/26/21 0445 09/27/21 0400 09/28/21 0500  Weight: 85.7 kg 81.2 kg 85.4 kg    Examination:   Constitutional: NAD, AAOx3, sitting in recliner HEENT: conjunctivae and lids normal, EOMI CV: No cyanosis.   RESP: normal respiratory effort, on RA Extremities: No effusions, edema in BLE.  Right arm in sling. SKIN: warm, dry Neuro: II - XII grossly intact.   Psych: grouchy mood and affect.     Data Reviewed: I have personally reviewed following labs and imaging studies  CBC: Recent Labs  Lab 09/22/21 0646 09/24/21 0346 09/26/21 0415 09/27/21 0338 09/28/21 0332  WBC 9.9 7.0 7.8 6.3 5.8  HGB 11.2* 11.0* 10.6* 9.7* 10.2*  HCT 34.3* 32.8* 31.9* 29.1* 30.5*  MCV 92.0 91.6 91.4 90.4 93.0  PLT 240 241 252 249 347   Basic Metabolic Panel: Recent Labs  Lab 09/22/21 0646 09/24/21 0346 09/26/21 0415 09/27/21 0338 09/28/21 0332  NA 129* 127* 130* 129* 128*  K  3.2* 3.3* 3.2* 2.9* 3.6  CL 92* 93* 93* 96* 94*  CO2 25 25 27 27 27   GLUCOSE 219* 226* 305* 211* 296*  BUN 42* 44* 51* 51* 42*  CREATININE 8.05* 8.06* 7.90* 7.15* 7.00*  CALCIUM 8.2* 7.7* 8.3* 8.0* 8.3*  MG  --   --  1.7 1.5* 1.6*  PHOS  --  7.1*  --   --   --    GFR: Estimated Creatinine Clearance: 6.5 mL/min (A) (by C-G formula based on SCr of 7 mg/dL (H)). Liver Function Tests: Recent Labs  Lab 09/24/21 0346  ALBUMIN 1.8*   No results for input(s): LIPASE, AMYLASE in the last 168 hours. No  results for input(s): AMMONIA in the last 168 hours. Coagulation Profile: No results for input(s): INR, PROTIME in the last 168 hours.  Cardiac Enzymes: No results for input(s): CKTOTAL, CKMB, CKMBINDEX, TROPONINI in the last 168 hours. BNP (last 3 results) No results for input(s): PROBNP in the last 8760 hours. HbA1C: No results for input(s): HGBA1C in the last 72 hours.  CBG: Recent Labs  Lab 09/27/21 1953 09/28/21 0404 09/28/21 0805 09/28/21 1156 09/28/21 1158  GLUCAP 215* 284* 249* 364* 341*   Lipid Profile: No results for input(s): CHOL, HDL, LDLCALC, TRIG, CHOLHDL, LDLDIRECT in the last 72 hours. Thyroid Function Tests: No results for input(s): TSH, T4TOTAL, FREET4, T3FREE, THYROIDAB in the last 72 hours. Anemia Panel: No results for input(s): VITAMINB12, FOLATE, FERRITIN, TIBC, IRON, RETICCTPCT in the last 72 hours. Sepsis Labs: No results for input(s): PROCALCITON, LATICACIDVEN in the last 168 hours.  Recent Results (from the past 240 hour(s))  Resp Panel by RT-PCR (Flu A&B, Covid) Nasopharyngeal Swab     Status: None   Collection Time: 09/21/21  2:25 AM   Specimen: Nasopharyngeal Swab; Nasopharyngeal(NP) swabs in vial transport medium  Result Value Ref Range Status   SARS Coronavirus 2 by RT PCR NEGATIVE NEGATIVE Final    Comment: (NOTE) SARS-CoV-2 target nucleic acids are NOT DETECTED.  The SARS-CoV-2 RNA is generally detectable in upper respiratory specimens during the acute phase of infection. The lowest concentration of SARS-CoV-2 viral copies this assay can detect is 138 copies/mL. A negative result does not preclude SARS-Cov-2 infection and should not be used as the sole basis for treatment or other patient management decisions. A negative result may occur with  improper specimen collection/handling, submission of specimen other than nasopharyngeal swab, presence of viral mutation(s) within the areas targeted by this assay, and inadequate number of  viral copies(<138 copies/mL). A negative result must be combined with clinical observations, patient history, and epidemiological information. The expected result is Negative.  Fact Sheet for Patients:  EntrepreneurPulse.com.au  Fact Sheet for Healthcare Providers:  IncredibleEmployment.be  This test is no t yet approved or cleared by the [place] FDA and  has been authorized for detection and/or diagnosis of SARS-CoV-2 by FDA under an Emergency Use Authorization (EUA). This EUA will remain  in effect (meaning this test can be used) for the duration of the COVID-19 declaration under Section 564(b)(1) of the Act, 21 U.S.C.section 360bbb-3(b)(1), unless the authorization is terminated  or revoked sooner.       Influenza A by PCR NEGATIVE NEGATIVE Final   Influenza B by PCR NEGATIVE NEGATIVE Final    Comment: (NOTE) The Xpert Xpress SARS-CoV-2/FLU/RSV plus assay is intended as an aid in the diagnosis of influenza from Nasopharyngeal swab specimens and should not be used as a sole basis for treatment. Nasal washings  and aspirates are unacceptable for Xpert Xpress SARS-CoV-2/FLU/RSV testing.  Fact Sheet for Patients: EntrepreneurPulse.com.au  Fact Sheet for Healthcare Providers: IncredibleEmployment.be  This test is not yet approved or cleared by the [place] FDA and has been authorized for detection and/or diagnosis of SARS-CoV-2 by FDA under an Emergency Use Authorization (EUA). This EUA will remain in effect (meaning this test can be used) for the duration of the COVID-19 declaration under Section 564(b)(1) of the Act, 21 U.S.C. section 360bbb-3(b)(1), unless the authorization is terminated or revoked.  Performed at [facility], [address], [place], [place] [address]   Surgical pcr screen     Status: None   Collection Time: 09/21/21 10:15 AM   Specimen: Nasal Mucosa; Nasal  Swab  Result Value Ref Range Status   MRSA, PCR NEGATIVE NEGATIVE Final   Staphylococcus aureus NEGATIVE NEGATIVE Final    Comment: (NOTE) The Xpert SA Assay (FDA approved for NASAL specimens in patients 68 years of age and older), is one component of a comprehensive surveillance program. It is not intended to diagnose infection nor to guide or monitor treatment. Performed at [facility], [address], [place], [place] [address]       Radiology Studies: No results found.   Scheduled Meds:  (feeding supplement) PROSource Plus  30 mL Oral Daily   ALPRAZolam  0.5 mg Oral QHS   atorvastatin  20 mg Oral Q supper   feeding supplement (NEPRO CARB STEADY)  237 mL Oral Q24H   furosemide  80 mg Oral Daily   gentamicin cream  1 application Topical Daily   insulin aspart  0-9 Units Subcutaneous TID WC   insulin detemir  8 Units Subcutaneous BID   magnesium oxide  800 mg Oral BID   metoprolol succinate  25 mg Oral Q breakfast   mometasone-formoterol  2 puff Inhalation BID   multivitamin   Oral Daily   pantoprazole  40 mg Oral BID   potassium chloride  40 mEq Oral Daily   spironolactone  25 mg Oral Daily   Vitamin D (Ergocalciferol)  50,000 Units Oral Weekly   Continuous Infusions:  dialysis solution 1.5% low-MG/low-CA     promethazine (PHENERGAN) injection (IM or IVPB)       LOS: 7 days     Enzo Bi, MD [facility] If 7PM-7AM, please contact night-coverage 09/28/2021, 4:06 PM

## 2021-09-28 NOTE — Progress Notes (Signed)
Occupational Therapy Treatment Patient Details Name: EBONIE WESTERLUND MRN: [id number] DOB: [birth year] Today's Date: 09/28/2021   History of present illness CECILE GILLISPIE is a 75 y.o. Caucasian female with medical history significant for COPD, depression, hypertension, type 2 diabetes mellitus end-stage renal disease on peritoneal dialysis and obesity, who presented to the emergency room following mechanical fall getting to her BSC and fell on her right side with subsequent right shoulder and hip pain.  Imaging showed a nondisplaced fracture of the lateral humeral head and an undisplaced right femoral intratrochanteric fracture.   OT comments  Ms Agredano was seen for OT treatment on this date. Upon arrival to room pt seated EOB with RN in room, pt agreeable to tx. Reviewed HEP with pt in sitting, pt reports completing as able. MAX A don sling seated EOB - small skin tear noted on L posterior neck, RN in room applied dressing. MIN A + HW for bed>chair t/f with assist for eccentric control only, pt completes side steps with increased effort but no cues for HW use. Pt making good progress toward goals. Pt continues to benefit from skilled OT services to maximize return to PLOF and minimize risk of future falls, injury, caregiver burden, and readmission. Will continue to follow POC. Discharge recommendation remains appropriate.     Recommendations for follow up therapy are one component of a multi-disciplinary discharge planning process, led by the attending physician.  Recommendations may be updated based on patient status, additional functional criteria and insurance authorization.    Follow Up Recommendations  Acute inpatient rehab (3hours/day)    Assistance Recommended at Discharge Intermittent Supervision/Assistance  Equipment Recommendations  Other (comment) (hemiwalker)    Recommendations for Other Services      Precautions / Restrictions Precautions Precautions:  Fall Restrictions Weight Bearing Restrictions: Yes RUE Weight Bearing: Weight bearing as tolerated RLE Weight Bearing: Weight bearing as tolerated Other Position/Activity Restrictions: RUE sling for comfort       Mobility Bed Mobility               General bed mobility comments: seated EOB with RN on arrival and left in chair    Transfers Overall transfer level: Needs assistance Equipment used: Hemi-walker Transfers: Sit to/from Stand;Bed to chair/wheelchair/BSC Sit to Stand: Min assist Stand pivot transfers: Min assist         General transfer comment: assist for eccentric control     Balance Overall balance assessment: Needs assistance Sitting-balance support: No upper extremity supported;Feet supported Sitting balance-Leahy Scale: Good     Standing balance support: During functional activity;Reliant on assistive device for balance;Single extremity supported Standing balance-Leahy Scale: Fair                             ADL either performed or assessed with clinical judgement   ADL Overall ADL's : Needs assistance/impaired                                       General ADL Comments: MAX A don sling seated EOB. MIN A + HW for ADL t/f.      Cognition Arousal/Alertness: Awake/alert Behavior During Therapy: WFL for tasks assessed/performed Overall Cognitive Status: Within Functional Limits for tasks assessed  Exercises Exercises: Other exercises;General Upper Extremity General Exercises - Upper Extremity Elbow Flexion: Self ROM;10 reps;Seated;Right Elbow Extension: Self ROM;10 reps;Seated;Right Wrist Flexion: 10 reps;Seated;Right;AROM Wrist Extension: 10 reps;Seated;Right;AROM Digit Composite Flexion: 10 reps;Seated;Right;AROM Composite Extension: 10 reps;Seated;Right;AROM Other Exercises Other Exercises: Pt educated re: d/c recs, falls prevention, ECS Other  Exercises: UBD, sit<>Stand, SPT, sitting/standing balance/tolerance, seated therex   Shoulder Instructions       General Comments skin tear noted to L posterior neck - RN in room to apply dressing prior to donning sling    Pertinent Vitals/ Pain       Pain Assessment: Faces Faces Pain Scale: Hurts even more Pain Location: R shoulder Pain Descriptors / Indicators: Discomfort;Sore Pain Intervention(s): Limited activity within patient's tolerance;Monitored during session;Repositioned   Frequency  Min 3X/week        Progress Toward Goals  OT Goals(current goals can now be found in the care plan section)  Progress towards OT goals: Progressing toward goals  Acute Rehab OT Goals Patient Stated Goal: to go to CIR OT Goal Formulation: With patient Time For Goal Achievement: 10/06/21 Potential to Achieve Goals: Good ADL Goals Pt Will Perform Grooming: with set-up;sitting Pt Will Perform Lower Body Dressing: with min assist;sit to/from stand Pt Will Transfer to Toilet: with min assist;ambulating;bedside commode Pt Will Perform Toileting - Clothing Manipulation and hygiene: with min assist;sit to/from stand  Plan Discharge plan remains appropriate;Frequency remains appropriate    Co-evaluation                 AM-PAC OT "6 Clicks" Daily Activity     Outcome Measure   Help from another person eating meals?: A Little Help from another person taking care of personal grooming?: A Little Help from another person toileting, which includes using toliet, bedpan, or urinal?: A Lot Help from another person bathing (including washing, rinsing, drying)?: A Lot Help from another person to put on and taking off regular upper body clothing?: A Lot Help from another person to put on and taking off regular lower body clothing?: A Lot 6 Click Score: 14    End of Session Equipment Utilized During Treatment: Other (comment) (HW)  OT Visit Diagnosis: Other abnormalities of gait and  mobility (R26.89);Muscle weakness (generalized) (M62.81)   Activity Tolerance Patient tolerated treatment well   Patient Left in chair;with call bell/phone within reach;with chair alarm set;with nursing/sitter in room   Nurse Communication Mobility status        Time: 1020-1037 OT Time Calculation (min): 17 min  Charges: OT General Charges $OT Visit: 1 Visit OT Treatments $Self Care/Home Management : 8-22 mins  Dessie Coma, M.S. OTR/L  09/28/21, 1:25 PM  ascom [phone]

## 2021-09-29 LAB — CBC
HCT: 31.3 % — ABNORMAL LOW (ref 36.0–46.0)
Hemoglobin: 10.3 g/dL — ABNORMAL LOW (ref 12.0–15.0)
MCH: 30.5 pg (ref 26.0–34.0)
MCHC: 32.9 g/dL (ref 30.0–36.0)
MCV: 92.6 fL (ref 80.0–100.0)
Platelets: 271 10*3/uL (ref 150–400)
RBC: 3.38 MIL/uL — ABNORMAL LOW (ref 3.87–5.11)
RDW: 14.5 % (ref 11.5–15.5)
WBC: 6.3 10*3/uL (ref 4.0–10.5)
nRBC: 0 % (ref 0.0–0.2)

## 2021-09-29 LAB — MAGNESIUM: Magnesium: 1.5 mg/dL — ABNORMAL LOW (ref 1.7–2.4)

## 2021-09-29 LAB — BASIC METABOLIC PANEL
Anion gap: 6 (ref 5–15)
BUN: 45 mg/dL — ABNORMAL HIGH (ref 8–23)
CO2: 27 mmol/L (ref 22–32)
Calcium: 8.3 mg/dL — ABNORMAL LOW (ref 8.9–10.3)
Chloride: 97 mmol/L — ABNORMAL LOW (ref 98–111)
Creatinine, Ser: 6.13 mg/dL — ABNORMAL HIGH (ref 0.44–1.00)
GFR, Estimated: 7 mL/min — ABNORMAL LOW (ref >60)
Glucose, Bld: 268 mg/dL — ABNORMAL HIGH (ref 70–99)
Potassium: 4 mmol/L (ref 3.5–5.1)
Sodium: 130 mmol/L — ABNORMAL LOW (ref 135–145)

## 2021-09-29 LAB — GLUCOSE, CAPILLARY
Glucose-Capillary: 231 mg/dL — ABNORMAL HIGH (ref 70–99)
Glucose-Capillary: 242 mg/dL — ABNORMAL HIGH (ref 70–99)
Glucose-Capillary: 245 mg/dL — ABNORMAL HIGH (ref 70–99)
Glucose-Capillary: 285 mg/dL — ABNORMAL HIGH (ref 70–99)

## 2021-09-29 MED ORDER — ALTEPLASE 2 MG IJ SOLR
INTRAMUSCULAR | Status: AC
  Filled 2021-09-29: qty 2

## 2021-09-29 MED ORDER — INSULIN DETEMIR 100 UNIT/ML ~~LOC~~ SOLN
12.0000 [IU] | Freq: Two times a day (BID) | SUBCUTANEOUS | Status: DC
  Administered 2021-09-30: 12 [IU] via SUBCUTANEOUS
  Filled 2021-09-29 (×2): qty 0.12

## 2021-09-29 NOTE — Progress Notes (Signed)
PROGRESS NOTE    Andrea Stewart  SJG:[id number] DOB: 03/15/1946 DOA: 09/21/2021 PCP: Rusty Aus, MD  147A/147A-AA   Assessment & Plan:   Active Problems:   Closed right hip fracture (HCC)   Andrea Stewart is a 75 y.o. obese Caucasian female with medical history significant for COPD, depression, hypertension, type 2 diabetes mellitus end-stage renal disease on peritoneal dialysis and obesity, who presented to the emergency room with acute onset of mechanical fall when she was getting in her bedroom to her portable toilet seat and stumbled and fell on her right side with subsequent right shoulder and hip pain.  Imaging showed a nondisplaced fracture of the lateral humeral head and an undisplaced right femoral intratrochanteric fracture.   Mechanical Fall Right nondisplaced femoral intratrochanteric fracture Right Nondisplaced Fracture of Lateral Humeral Head with involvement of the Greater Tuberosity Ortho c/s, appreciate recs, Dr. Sabra Heck recommending nonoperative management  --RUE sling --follow up with ortho in clinic in 2 weeks --oral pain regimen --d/c to CIR on Wed   ESRD on peritoneal dialysis  --PD cath finally starting working well --cont PD    Hypertension --well controlled on current regimen --cont lasix, spironolactone and metop   T2DM c/b peripheral neuropathy --recurrent hypoglycemia during evening checks. --switched from Novolin to Levemir on 11/13. Plan: --increase Levemir to 12u BID --cont mealtime 4u TID --SSI TID for now   Dyslipidemia --cont statin   COPD  --cont daily bronchodilator   Anxiety --cont home Xanax nightly   DVT prophylaxis: SCD/Compression stockings Code Status: Full code  Family Communication:  Level of care: Med-Surg Dispo:   The patient is from: home Anticipated d/c is to: CIR Anticipated d/c date is: Wed, when bed available  Patient currently is medically ready to d/c.   Subjective and Interval History:  PD  cath continued to function properly.  No new complaint today.  Normal oral intake, urination and BM.   Objective: Vitals:   09/29/21 0500 09/29/21 0610 09/29/21 0739 09/29/21 1538  BP:  (!) 143/53 112/75 107/64  Pulse:  61 (!) 107 (!) 102  Resp:  20 18 18   Temp:  97.7 F (36.5 C) 97.7 F (36.5 C) 98.2 F (36.8 C)  TempSrc:   Oral   SpO2:  96% 96% 96%  Weight: 88.2 kg     Height:       No intake or output data in the 24 hours ending 09/29/21 1643   Filed Weights   09/27/21 0400 09/28/21 0500 09/29/21 0500  Weight: 81.2 kg 85.4 kg 88.2 kg    Examination:   Constitutional: NAD, AAOx3, sitting on bedside commode HEENT: conjunctivae and lids normal, EOMI CV: No cyanosis.   RESP: normal respiratory effort, on RA Extremities: No effusions, edema in BLE SKIN: warm, dry Neuro: II - XII grossly intact.     Data Reviewed: I have personally reviewed following labs and imaging studies  CBC: Recent Labs  Lab 09/24/21 0346 09/26/21 0415 09/27/21 0338 09/28/21 0332 09/29/21 0600  WBC 7.0 7.8 6.3 5.8 6.3  HGB 11.0* 10.6* 9.7* 10.2* 10.3*  HCT 32.8* 31.9* 29.1* 30.5* 31.3*  MCV 91.6 91.4 90.4 93.0 92.6  PLT 241 252 249 269 654   Basic Metabolic Panel: Recent Labs  Lab 09/24/21 0346 09/26/21 0415 09/27/21 0338 09/28/21 0332 09/29/21 0600  NA 127* 130* 129* 128* 130*  K 3.3* 3.2* 2.9* 3.6 4.0  CL 93* 93* 96* 94* 97*  CO2 25 27 27 27 27   GLUCOSE 226*  305* 211* 296* 268*  BUN 44* 51* 51* 42* 45*  CREATININE 8.06* 7.90* 7.15* 7.00* 6.13*  CALCIUM 7.7* 8.3* 8.0* 8.3* 8.3*  MG  --  1.7 1.5* 1.6* 1.5*  PHOS 7.1*  --   --   --   --    GFR: Estimated Creatinine Clearance: 7.5 mL/min (A) (by C-G formula based on SCr of 6.13 mg/dL (H)). Liver Function Tests: Recent Labs  Lab 09/24/21 0346  ALBUMIN 1.8*   No results for input(s): LIPASE, AMYLASE in the last 168 hours. No results for input(s): AMMONIA in the last 168 hours. Coagulation Profile: No results for  input(s): INR, PROTIME in the last 168 hours.  Cardiac Enzymes: No results for input(s): CKTOTAL, CKMB, CKMBINDEX, TROPONINI in the last 168 hours. BNP (last 3 results) No results for input(s): PROBNP in the last 8760 hours. HbA1C: No results for input(s): HGBA1C in the last 72 hours.  CBG: Recent Labs  Lab 09/28/21 1622 09/28/21 2036 09/29/21 0748 09/29/21 1158 09/29/21 1600  GLUCAP 271* 234* 231* 242* 245*   Lipid Profile: No results for input(s): CHOL, HDL, LDLCALC, TRIG, CHOLHDL, LDLDIRECT in the last 72 hours. Thyroid Function Tests: No results for input(s): TSH, T4TOTAL, FREET4, T3FREE, THYROIDAB in the last 72 hours. Anemia Panel: No results for input(s): VITAMINB12, FOLATE, FERRITIN, TIBC, IRON, RETICCTPCT in the last 72 hours. Sepsis Labs: No results for input(s): PROCALCITON, LATICACIDVEN in the last 168 hours.  Recent Results (from the past 240 hour(s))  Resp Panel by RT-PCR (Flu A&B, Covid) Nasopharyngeal Swab     Status: None   Collection Time: 09/21/21  2:25 AM   Specimen: Nasopharyngeal Swab; Nasopharyngeal(NP) swabs in vial transport medium  Result Value Ref Range Status   SARS Coronavirus 2 by RT PCR NEGATIVE NEGATIVE Final    Comment: (NOTE) SARS-CoV-2 target nucleic acids are NOT DETECTED.  The SARS-CoV-2 RNA is generally detectable in upper respiratory specimens during the acute phase of infection. The lowest concentration of SARS-CoV-2 viral copies this assay can detect is 138 copies/mL. A negative result does not preclude SARS-Cov-2 infection and should not be used as the sole basis for treatment or other patient management decisions. A negative result may occur with  improper specimen collection/handling, submission of specimen other than nasopharyngeal swab, presence of viral mutation(s) within the areas targeted by this assay, and inadequate number of viral copies(<138 copies/mL). A negative result must be combined with clinical observations,  patient history, and epidemiological information. The expected result is Negative.  Fact Sheet for Patients:  EntrepreneurPulse.com.au  Fact Sheet for Healthcare Providers:  IncredibleEmployment.be  This test is no t yet approved or cleared by the [place] FDA and  has been authorized for detection and/or diagnosis of SARS-CoV-2 by FDA under an Emergency Use Authorization (EUA). This EUA will remain  in effect (meaning this test can be used) for the duration of the COVID-19 declaration under Section 564(b)(1) of the Act, 21 U.S.C.section 360bbb-3(b)(1), unless the authorization is terminated  or revoked sooner.       Influenza A by PCR NEGATIVE NEGATIVE Final   Influenza B by PCR NEGATIVE NEGATIVE Final    Comment: (NOTE) The Xpert Xpress SARS-CoV-2/FLU/RSV plus assay is intended as an aid in the diagnosis of influenza from Nasopharyngeal swab specimens and should not be used as a sole basis for treatment. Nasal washings and aspirates are unacceptable for Xpert Xpress SARS-CoV-2/FLU/RSV testing.  Fact Sheet for Patients: EntrepreneurPulse.com.au  Fact Sheet for Healthcare Providers: IncredibleEmployment.be  This  test is not yet approved or cleared by the [place] and has been authorized for detection and/or diagnosis of SARS-CoV-2 by FDA under an Emergency Use Authorization (EUA). This EUA will remain in effect (meaning this test can be used) for the duration of the COVID-19 declaration under Section 564(b)(1) of the Act, 21 U.S.C. section 360bbb-3(b)(1), unless the authorization is terminated or revoked.  Performed at [facility], [address] [place], [place] [address]   Surgical pcr screen     Status: None   Collection Time: 09/21/21 10:15 AM   Specimen: Nasal Mucosa; Nasal Swab  Result Value Ref Range Status   MRSA, PCR NEGATIVE NEGATIVE Final   Staphylococcus aureus  NEGATIVE NEGATIVE Final    Comment: (NOTE) The Xpert SA Assay (FDA approved for NASAL specimens in patients 38 years of age and older), is one component of a comprehensive surveillance program. It is not intended to diagnose infection nor to guide or monitor treatment. Performed at [facility], [address] [place], [place] [address]       Radiology Studies: No results found.   Scheduled Meds:  (feeding supplement) PROSource Plus  30 mL Oral Daily   ALPRAZolam  0.5 mg Oral QHS   alteplase       atorvastatin  20 mg Oral Q supper   feeding supplement (NEPRO CARB STEADY)  237 mL Oral Q24H   furosemide  80 mg Oral Daily   gentamicin cream  1 application Topical Daily   insulin aspart  0-9 Units Subcutaneous TID WC   insulin aspart  4 Units Subcutaneous TID WC   insulin detemir  10 Units Subcutaneous BID   magnesium oxide  800 mg Oral BID   metoprolol succinate  25 mg Oral Q breakfast   mometasone-formoterol  2 puff Inhalation BID   multivitamin   Oral Daily   pantoprazole  40 mg Oral BID   potassium chloride  40 mEq Oral Daily   spironolactone  25 mg Oral Daily   Vitamin D (Ergocalciferol)  50,000 Units Oral Weekly   Continuous Infusions:  dialysis solution 1.5% low-MG/low-CA     promethazine (PHENERGAN) injection (IM or IVPB)       LOS: 8 days     Enzo Bi, MD [facility] If 7PM-7AM, please contact night-coverage 09/29/2021, 4:43 PM

## 2021-09-29 NOTE — Progress Notes (Addendum)
Physical Therapy Treatment Patient Details Name: Andrea Stewart MRN: [id number] DOB: April 19, 1946 Today's Date: 09/29/2021   History of Present Illness Andrea Stewart is a 75 y.o. Caucasian female with medical history significant for COPD, depression, hypertension, type 2 diabetes mellitus end-stage renal disease on peritoneal dialysis and obesity, who presented to the emergency room following mechanical fall getting to her BSC and fell on her right side with subsequent right shoulder and hip pain.  Imaging showed a nondisplaced fracture of the lateral humeral head and an undisplaced right femoral intratrochanteric fracture.    PT Comments    Pt seen this am, motivated to get out of bed. Pre-medicated prior to session with 4/10 c/o pain R shoulder once sitting upright.  Pt continues to require ModA for bed mobility, however only required MinA for sit to stand transfers from various surfaces. Pt is able to take steps with support of hemi-walker short distances due to pain and fear of falling. Pt however remains very motivated and continues to be an excellent candidate for STR once medically stable. Continue PT per POC.   Recommendations for follow up therapy are one component of a multi-disciplinary discharge planning process, led by the attending physician.  Recommendations may be updated based on patient status, additional functional criteria and insurance authorization.  Follow Up Recommendations  Acute inpatient rehab (3hours/day)     Assistance Recommended at Discharge Frequent or constant Supervision/Assistance  Equipment Recommendations       Recommendations for Other Services Rehab consult     Precautions / Restrictions Precautions Precautions: Fall Precaution Comments: WBAT on R LE and R UE, R UE remained in sling for duration of therapy session and opted to keep it on after. Required Braces or Orthoses: Sling Restrictions Weight Bearing Restrictions: Yes RUE Weight  Bearing: Weight bearing as tolerated LUE Weight Bearing: Weight bearing as tolerated RLE Weight Bearing: Weight bearing as tolerated LLE Weight Bearing: Weight bearing as tolerated Other Position/Activity Restrictions: RUE sling for comfort     Mobility  Bed Mobility Overal bed mobility: Needs Assistance Bed Mobility: Supine to Sit     Supine to sit: Mod assist;HOB elevated Sit to supine: Max assist   General bed mobility comments: MaxA to transition sup to sit on Left side of bed    Transfers Overall transfer level: Needs assistance   Transfers: Sit to/from Stand Sit to Stand: Min assist;Min guard Stand pivot transfers: Min assist         General transfer comment:  (Good demonstration of safety awareness)    Ambulation/Gait                   Stairs             Wheelchair Mobility    Modified Rankin (Stroke Patients Only)       Balance Overall balance assessment: Needs assistance Sitting-balance support: No upper extremity supported;Feet supported Sitting balance-Leahy Scale: Good     Standing balance support: Single extremity supported;Reliant on assistive device for balance Standing balance-Leahy Scale: Fair Standing balance comment:  (Pt able to maintain static stance for hygiene after use of BSC)                            Cognition Arousal/Alertness: Awake/alert Behavior During Therapy: WFL for tasks assessed/performed Overall Cognitive Status: Within Functional Limits for tasks assessed  General Comments: pt is A and O x 4. Frustrated with current situation and upset with nephrology about recommendation of HD versus PD        Exercises Total Joint Exercises Ankle Circles/Pumps: AROM;10 reps Heel Slides: AROM;10 reps Long Arc Quad: AROM;10 reps;Both Correct positioning of sling/immobilizer: Moderate assistance    General Comments General comments (skin integrity, edema,  etc.):  (Red painful area noted on L lateral heel, open skin areas on R shoulder due to sctratching, Nursing notified and aware.)      Pertinent Vitals/Pain Pain Assessment: 0-10 Pain Score: 4  Pain Location: R shoulder Pain Descriptors / Indicators: Discomfort;Sore Pain Intervention(s): Monitored during session;Premedicated before session    Home Living                          Prior Function            PT Goals (current goals can now be found in the care plan section) Acute Rehab PT Goals Patient Stated Goal: get better Progress towards PT goals: Progressing toward goals    Frequency    7X/week      PT Plan Current plan remains appropriate    Co-evaluation              AM-PAC PT "6 Clicks" Mobility   Outcome Measure  Help needed turning from your back to your side while in a flat bed without using bedrails?: A Little Help needed moving from lying on your back to sitting on the side of a flat bed without using bedrails?: A Little Help needed moving to and from a bed to a chair (including a wheelchair)?: A Lot Help needed standing up from a chair using your arms (e.g., wheelchair or bedside chair)?: A Lot Help needed to walk in hospital room?: A Lot Help needed climbing 3-5 steps with a railing? : Total 6 Click Score: 13    End of Session Equipment Utilized During Treatment: Gait belt Activity Tolerance: Patient tolerated treatment well (cues given for safe slow movements to reduce pain) Patient left: in chair;with call bell/phone within reach;with chair alarm set Nurse Communication: Mobility status PT Visit Diagnosis: Unsteadiness on feet (R26.81);Other abnormalities of gait and mobility (R26.89);Repeated falls (R29.6);Muscle weakness (generalized) (M62.81);History of falling (Z91.81);Difficulty in walking, not elsewhere classified (R26.2)     Time: 1020-1108 PT Time Calculation (min) (ACUTE ONLY): 48 min  Charges:  $Gait Training: 8-22  mins $Therapeutic Exercise: 8-22 mins $Therapeutic Activity: 8-22 mins                    Mikel Cella, PTA    Josie Dixon 09/29/2021, 1:24 PM

## 2021-09-29 NOTE — Progress Notes (Signed)
[facility]  PROGRESS NOTE   Subjective:   Peritoneal dialysis last night. Tolerated treatment well.   Patient complains of nausea this morning. Also states her pain of her right shoulder is not at goal.     Objective:  Vital signs in last 24 hours:  Temp:  [97.7 F (36.5 C)-98.7 F (37.1 C)] 97.7 F (36.5 C) (11/15 0739) Pulse Rate:  [61-107] 107 (11/15 0739) Resp:  [15-20] 18 (11/15 0739) BP: (112-143)/(53-83) 112/75 (11/15 0739) SpO2:  [96 %-100 %] 96 % (11/15 0739) Weight:  [88.2 kg] 88.2 kg (11/15 0500)  Weight change: 2.8 kg Filed Weights   09/27/21 0400 09/28/21 0500 09/29/21 0500  Weight: 81.2 kg 85.4 kg 88.2 kg    Intake/Output: I/O last 3 completed shifts: In: 100 [P.O.:100] Out: -    Intake/Output this shift:  No intake/output data recorded.  Physical Exam: General:  No acute distress  Head:  Normocephalic, atraumatic. Moist oral mucosal membranes  Eyes:  Anicteric  Neck:  Supple  Lungs:   Clear to auscultation, normal effort  Heart:  regular  Abdomen:   Soft, nontender, bowel sounds present  Extremities:  No peripheral edema. Right upper extremity in sling  Neurologic:  Awake, alert, following commands  Skin:  No lesions  Access: PD catheter    Basic Metabolic Panel: Recent Labs  Lab 09/24/21 0346 09/26/21 0415 09/27/21 0338 09/28/21 0332 09/29/21 0600  NA 127* 130* 129* 128* 130*  K 3.3* 3.2* 2.9* 3.6 4.0  CL 93* 93* 96* 94* 97*  CO2 25 27 27 27 27   GLUCOSE 226* 305* 211* 296* 268*  BUN 44* 51* 51* 42* 45*  CREATININE 8.06* 7.90* 7.15* 7.00* 6.13*  CALCIUM 7.7* 8.3* 8.0* 8.3* 8.3*  MG  --  1.7 1.5* 1.6* 1.5*  PHOS 7.1*  --   --   --   --      CBC: Recent Labs  Lab 09/24/21 0346 09/26/21 0415 09/27/21 0338 09/28/21 0332 09/29/21 0600  WBC 7.0 7.8 6.3 5.8 6.3  HGB 11.0* 10.6* 9.7* 10.2* 10.3*  HCT 32.8* 31.9* 29.1* 30.5* 31.3*  MCV 91.6 91.4 90.4 93.0 92.6  PLT 241 252 249 269 271      Urinalysis: No  results for input(s): COLORURINE, LABSPEC, PHURINE, GLUCOSEU, HGBUR, BILIRUBINUR, KETONESUR, PROTEINUR, UROBILINOGEN, NITRITE, LEUKOCYTESUR in the last 72 hours.  Invalid input(s): APPERANCEUR    Imaging: No results found.   Medications:    dialysis solution 1.5% low-MG/low-CA     promethazine (PHENERGAN) injection (IM or IVPB)      (feeding supplement) PROSource Plus  30 mL Oral Daily   ALPRAZolam  0.5 mg Oral QHS   alteplase       atorvastatin  20 mg Oral Q supper   feeding supplement (NEPRO CARB STEADY)  237 mL Oral Q24H   furosemide  80 mg Oral Daily   gentamicin cream  1 application Topical Daily   insulin aspart  0-9 Units Subcutaneous TID WC   insulin aspart  4 Units Subcutaneous TID WC   insulin detemir  10 Units Subcutaneous BID   magnesium oxide  800 mg Oral BID   metoprolol succinate  25 mg Oral Q breakfast   mometasone-formoterol  2 puff Inhalation BID   multivitamin   Oral Daily   pantoprazole  40 mg Oral BID   potassium chloride  40 mEq Oral Daily   spironolactone  25 mg Oral Daily   Vitamin D (Ergocalciferol)  50,000 Units Oral Weekly  Assessment/ Plan:     Active Problems:   Closed right hip fracture [facility])  Andrea Stewart is a 75 y.o. white female with end stage renal disease on peritoneal dialysis, hypertension, COPD, diabetes mellitus type II, who is admitted to [facility] on 09/21/2021 for Ventral hernia without obstruction or gangrene [K43.9] Closed right hip fracture ([place]) [S72.001A] Fall, initial encounter [W19.XXXA] Closed intertrochanteric fracture of hip, right, initial encounter ([place]) [S72.141A] Humeral head fracture, right, closed, initial encounter [S42.291A]  [facility] 73kg CCPD 5 cycles, 2 liter fills 9.5 hours.    #1: End stage renal disease on peritoneal dialysis. With complication of dialysis device. PD catheter working well status post TPA.  - Schedule peritoneal dialysis for tonight. Orders prepared.    #2: Anemia  of chronic kidney disease: hemoglobin 10.3, normocytic.  - Mircera as an outpatient.   #3. 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 Remains on furosemide.  - Continue potassium chloride and spironolactone.    #4: Hypertension: 112/75. Current regimen of metoprolol, furosemide, and spironolactone.    #5: Diabetes mellitus type II: Hemoglobin A1c of 9.7%.  - Continue glucose control    LOS: 8 Lavonia Dana, MD [facility] 11/15/20221:27 PM

## 2021-09-29 NOTE — Care Management Important Message (Signed)
Important Message  Patient Details  Name: Andrea Stewart MRN: [id number] Date of Birth: 09/04/1946   Medicare Important Message Given:  Yes     [name] 09/29/2021, 12:46 PM

## 2021-09-30 ENCOUNTER — Encounter (HOSPITAL_COMMUNITY): Payer: Self-pay | Admitting: Physical Medicine and Rehabilitation

## 2021-09-30 ENCOUNTER — Other Ambulatory Visit: Payer: Self-pay

## 2021-09-30 ENCOUNTER — Inpatient Hospital Stay (HOSPITAL_COMMUNITY)
Admission: RE | Admit: 2021-09-30 | Discharge: 2021-10-16 | DRG: 559 | Disposition: A | Discharge status: Home Health | Payer: PPO | Source: Other Acute Inpatient Hospital | Attending: Physical Medicine and Rehabilitation | Admitting: Physical Medicine and Rehabilitation

## 2021-09-30 DIAGNOSIS — S72114D Nondisplaced fracture of greater trochanter of right femur, subsequent encounter for closed fracture with routine healing: Principal | ICD-10-CM

## 2021-09-30 DIAGNOSIS — K5900 Constipation, unspecified: Secondary | ICD-10-CM | POA: Diagnosis not present

## 2021-09-30 DIAGNOSIS — D638 Anemia in other chronic diseases classified elsewhere: Secondary | ICD-10-CM

## 2021-09-30 DIAGNOSIS — W010XXD Fall on same level from slipping, tripping and stumbling without subsequent striking against object, subsequent encounter: Secondary | ICD-10-CM | POA: Diagnosis present

## 2021-09-30 DIAGNOSIS — R7309 Other abnormal glucose: Secondary | ICD-10-CM

## 2021-09-30 DIAGNOSIS — G8918 Other acute postprocedural pain: Secondary | ICD-10-CM | POA: Diagnosis present

## 2021-09-30 DIAGNOSIS — R609 Edema, unspecified: Secondary | ICD-10-CM | POA: Diagnosis not present

## 2021-09-30 DIAGNOSIS — M25511 Pain in right shoulder: Secondary | ICD-10-CM

## 2021-09-30 DIAGNOSIS — Z992 Dependence on renal dialysis: Secondary | ICD-10-CM | POA: Diagnosis not present

## 2021-09-30 DIAGNOSIS — Z905 Acquired absence of kidney: Secondary | ICD-10-CM

## 2021-09-30 DIAGNOSIS — S72141A Displaced intertrochanteric fracture of right femur, initial encounter for closed fracture: Secondary | ICD-10-CM

## 2021-09-30 DIAGNOSIS — G479 Sleep disorder, unspecified: Secondary | ICD-10-CM

## 2021-09-30 DIAGNOSIS — E1122 Type 2 diabetes mellitus with diabetic chronic kidney disease: Secondary | ICD-10-CM | POA: Diagnosis not present

## 2021-09-30 DIAGNOSIS — G609 Hereditary and idiopathic neuropathy, unspecified: Secondary | ICD-10-CM | POA: Diagnosis present

## 2021-09-30 DIAGNOSIS — Z7951 Long term (current) use of inhaled steroids: Secondary | ICD-10-CM

## 2021-09-30 DIAGNOSIS — F32A Depression, unspecified: Secondary | ICD-10-CM | POA: Diagnosis not present

## 2021-09-30 DIAGNOSIS — Z886 Allergy status to analgesic agent status: Secondary | ICD-10-CM

## 2021-09-30 DIAGNOSIS — Z794 Long term (current) use of insulin: Secondary | ICD-10-CM

## 2021-09-30 DIAGNOSIS — Z825 Family history of asthma and other chronic lower respiratory diseases: Secondary | ICD-10-CM

## 2021-09-30 DIAGNOSIS — S42294D Other nondisplaced fracture of upper end of right humerus, subsequent encounter for fracture with routine healing: Secondary | ICD-10-CM

## 2021-09-30 DIAGNOSIS — Z833 Family history of diabetes mellitus: Secondary | ICD-10-CM

## 2021-09-30 DIAGNOSIS — S72001D Fracture of unspecified part of neck of right femur, subsequent encounter for closed fracture with routine healing: Secondary | ICD-10-CM | POA: Diagnosis not present

## 2021-09-30 DIAGNOSIS — E8770 Fluid overload, unspecified: Secondary | ICD-10-CM | POA: Diagnosis present

## 2021-09-30 DIAGNOSIS — Z6841 Body Mass Index (BMI) 40.0 and over, adult: Secondary | ICD-10-CM

## 2021-09-30 DIAGNOSIS — E1129 Type 2 diabetes mellitus with other diabetic kidney complication: Secondary | ICD-10-CM | POA: Diagnosis not present

## 2021-09-30 DIAGNOSIS — S72001A Fracture of unspecified part of neck of right femur, initial encounter for closed fracture: Secondary | ICD-10-CM | POA: Diagnosis not present

## 2021-09-30 DIAGNOSIS — F419 Anxiety disorder, unspecified: Secondary | ICD-10-CM | POA: Diagnosis not present

## 2021-09-30 DIAGNOSIS — Z8249 Family history of ischemic heart disease and other diseases of the circulatory system: Secondary | ICD-10-CM

## 2021-09-30 DIAGNOSIS — G4700 Insomnia, unspecified: Secondary | ICD-10-CM | POA: Diagnosis not present

## 2021-09-30 DIAGNOSIS — Z79899 Other long term (current) drug therapy: Secondary | ICD-10-CM

## 2021-09-30 DIAGNOSIS — I959 Hypotension, unspecified: Secondary | ICD-10-CM | POA: Diagnosis not present

## 2021-09-30 DIAGNOSIS — E11649 Type 2 diabetes mellitus with hypoglycemia without coma: Secondary | ICD-10-CM | POA: Diagnosis not present

## 2021-09-30 DIAGNOSIS — Z981 Arthrodesis status: Secondary | ICD-10-CM

## 2021-09-30 DIAGNOSIS — N186 End stage renal disease: Secondary | ICD-10-CM | POA: Diagnosis not present

## 2021-09-30 DIAGNOSIS — E1142 Type 2 diabetes mellitus with diabetic polyneuropathy: Secondary | ICD-10-CM | POA: Diagnosis present

## 2021-09-30 DIAGNOSIS — Z885 Allergy status to narcotic agent status: Secondary | ICD-10-CM

## 2021-09-30 DIAGNOSIS — E1165 Type 2 diabetes mellitus with hyperglycemia: Secondary | ICD-10-CM | POA: Diagnosis not present

## 2021-09-30 DIAGNOSIS — M898X9 Other specified disorders of bone, unspecified site: Secondary | ICD-10-CM | POA: Diagnosis present

## 2021-09-30 DIAGNOSIS — Z880 Allergy status to penicillin: Secondary | ICD-10-CM

## 2021-09-30 DIAGNOSIS — F17210 Nicotine dependence, cigarettes, uncomplicated: Secondary | ICD-10-CM | POA: Diagnosis present

## 2021-09-30 DIAGNOSIS — E785 Hyperlipidemia, unspecified: Secondary | ICD-10-CM | POA: Diagnosis not present

## 2021-09-30 DIAGNOSIS — J449 Chronic obstructive pulmonary disease, unspecified: Secondary | ICD-10-CM | POA: Diagnosis present

## 2021-09-30 DIAGNOSIS — N2581 Secondary hyperparathyroidism of renal origin: Secondary | ICD-10-CM | POA: Diagnosis not present

## 2021-09-30 DIAGNOSIS — D631 Anemia in chronic kidney disease: Secondary | ICD-10-CM | POA: Diagnosis not present

## 2021-09-30 DIAGNOSIS — E876 Hypokalemia: Secondary | ICD-10-CM | POA: Diagnosis not present

## 2021-09-30 DIAGNOSIS — Z90710 Acquired absence of both cervix and uterus: Secondary | ICD-10-CM

## 2021-09-30 DIAGNOSIS — E871 Hypo-osmolality and hyponatremia: Secondary | ICD-10-CM | POA: Diagnosis present

## 2021-09-30 DIAGNOSIS — I120 Hypertensive chronic kidney disease with stage 5 chronic kidney disease or end stage renal disease: Secondary | ICD-10-CM | POA: Diagnosis not present

## 2021-09-30 DIAGNOSIS — Z7982 Long term (current) use of aspirin: Secondary | ICD-10-CM

## 2021-09-30 DIAGNOSIS — K439 Ventral hernia without obstruction or gangrene: Secondary | ICD-10-CM

## 2021-09-30 DIAGNOSIS — Z9049 Acquired absence of other specified parts of digestive tract: Secondary | ICD-10-CM

## 2021-09-30 DIAGNOSIS — Z888 Allergy status to other drugs, medicaments and biological substances status: Secondary | ICD-10-CM

## 2021-09-30 LAB — CBC
HCT: 30 % — ABNORMAL LOW (ref 36.0–46.0)
Hemoglobin: 9.9 g/dL — ABNORMAL LOW (ref 12.0–15.0)
MCH: 30.2 pg (ref 26.0–34.0)
MCHC: 33 g/dL (ref 30.0–36.0)
MCV: 91.5 fL (ref 80.0–100.0)
Platelets: 307 10*3/uL (ref 150–400)
RBC: 3.28 MIL/uL — ABNORMAL LOW (ref 3.87–5.11)
RDW: 14.3 % (ref 11.5–15.5)
WBC: 7.1 10*3/uL (ref 4.0–10.5)
nRBC: 0 % (ref 0.0–0.2)

## 2021-09-30 LAB — GLUCOSE, CAPILLARY
Glucose-Capillary: 309 mg/dL — ABNORMAL HIGH (ref 70–99)
Glucose-Capillary: 243 mg/dL — ABNORMAL HIGH (ref 70–99)
Glucose-Capillary: 233 mg/dL — ABNORMAL HIGH (ref 70–99)
Glucose-Capillary: 206 mg/dL — ABNORMAL HIGH (ref 70–99)
Glucose-Capillary: 209 mg/dL — ABNORMAL HIGH (ref 70–99)

## 2021-09-30 LAB — BASIC METABOLIC PANEL WITH GFR
Anion gap: 5 (ref 5–15)
BUN: 48 mg/dL — ABNORMAL HIGH (ref 8–23)
CO2: 27 mmol/L (ref 22–32)
Calcium: 8.3 mg/dL — ABNORMAL LOW (ref 8.9–10.3)
Chloride: 96 mmol/L — ABNORMAL LOW (ref 98–111)
Creatinine, Ser: 5.84 mg/dL — ABNORMAL HIGH (ref 0.44–1.00)
GFR, Estimated: 7 mL/min — ABNORMAL LOW
Glucose, Bld: 323 mg/dL — ABNORMAL HIGH (ref 70–99)
Potassium: 3.9 mmol/L (ref 3.5–5.1)
Sodium: 128 mmol/L — ABNORMAL LOW (ref 135–145)

## 2021-09-30 LAB — MAGNESIUM: Magnesium: 1.6 mg/dL — ABNORMAL LOW (ref 1.7–2.4)

## 2021-09-30 MED ORDER — ONDANSETRON HCL 4 MG PO TABS: 4.0000 mg | ORAL_TABLET | Freq: Four times a day (QID) | ORAL | Status: DC | PRN

## 2021-09-30 MED ORDER — POTASSIUM CHLORIDE 20 MEQ PO PACK
40.0000 meq | PACK | Freq: Every day | ORAL | Status: DC
  Administered 2021-09-30 – 2021-10-16 (×16): 40 meq via ORAL
  Filled 2021-09-30 (×17): qty 2

## 2021-09-30 MED ORDER — HEPARIN 1000 UNIT/ML FOR PERITONEAL DIALYSIS: 500.0000 [IU] | INTRAMUSCULAR | Status: DC | PRN

## 2021-09-30 MED ORDER — ALBUTEROL SULFATE (2.5 MG/3ML) 0.083% IN NEBU: 3.0000 mL | INHALATION_SOLUTION | RESPIRATORY_TRACT | Status: DC | PRN

## 2021-09-30 MED ORDER — ONDANSETRON HCL 4 MG/2ML IJ SOLN: 4.0000 mg | Freq: Four times a day (QID) | INTRAMUSCULAR | Status: DC | PRN

## 2021-09-30 MED ORDER — METOPROLOL SUCCINATE ER 25 MG PO TB24
37.5000 mg | ORAL_TABLET | Freq: Every day | ORAL | Status: DC
  Administered 2021-10-01 – 2021-10-12 (×12): 37.5 mg via ORAL
  Filled 2021-09-30 (×13): qty 2

## 2021-09-30 MED ORDER — SENNA 8.6 MG PO TABS: 1.0000 | ORAL_TABLET | Freq: Every evening | ORAL | Status: DC | PRN

## 2021-09-30 MED ORDER — INSULIN ASPART 100 UNIT/ML IJ SOLN
0.0000 [IU] | Freq: Three times a day (TID) | INTRAMUSCULAR | Status: DC
Start: 2021-09-30 — End: 2021-10-10
  Administered 2021-09-30: 3 [IU] via SUBCUTANEOUS
  Administered 2021-10-01: 07:00:00 7 [IU] via SUBCUTANEOUS
  Administered 2021-10-01: 13:00:00 3 [IU] via SUBCUTANEOUS
  Administered 2021-10-01: 18:00:00 2 [IU] via SUBCUTANEOUS
  Administered 2021-10-02: 3 [IU] via SUBCUTANEOUS
  Administered 2021-10-03: 1 [IU] via SUBCUTANEOUS
  Administered 2021-10-03: 2 [IU] via SUBCUTANEOUS
  Administered 2021-10-04: 1 [IU] via SUBCUTANEOUS
  Administered 2021-10-04: 7 [IU] via SUBCUTANEOUS
  Administered 2021-10-04 – 2021-10-05 (×2): 3 [IU] via SUBCUTANEOUS
  Administered 2021-10-05: 7 [IU] via SUBCUTANEOUS
  Administered 2021-10-06: 9 [IU] via SUBCUTANEOUS
  Administered 2021-10-06: 1 [IU] via SUBCUTANEOUS
  Administered 2021-10-07: 9 [IU] via SUBCUTANEOUS
  Administered 2021-10-08: 5 [IU] via SUBCUTANEOUS
  Administered 2021-10-09: 2 [IU] via SUBCUTANEOUS
  Administered 2021-10-10: 5 [IU] via SUBCUTANEOUS
  Administered 2021-10-10: 2 [IU] via SUBCUTANEOUS

## 2021-09-30 MED ORDER — ONDANSETRON HCL 4 MG PO TABS
4.0000 mg | ORAL_TABLET | Freq: Four times a day (QID) | ORAL | Status: DC | PRN
  Administered 2021-10-07 – 2021-10-12 (×3): 4 mg via ORAL
  Filled 2021-09-30 (×3): qty 1

## 2021-09-30 MED ORDER — ACETAMINOPHEN 325 MG PO TABS
650.0000 mg | ORAL_TABLET | Freq: Four times a day (QID) | ORAL | Status: DC | PRN
  Administered 2021-09-30 – 2021-10-13 (×9): 650 mg via ORAL
  Filled 2021-09-30 (×11): qty 2

## 2021-09-30 MED ORDER — OXYCODONE HCL 5 MG PO TABS
5.0000 mg | ORAL_TABLET | Freq: Once | ORAL | Status: AC
  Administered 2021-09-30: 5 mg via ORAL
  Filled 2021-09-30: qty 1

## 2021-09-30 MED ORDER — MAGNESIUM OXIDE -MG SUPPLEMENT 400 (240 MG) MG PO TABS
800.0000 mg | ORAL_TABLET | Freq: Two times a day (BID) | ORAL | Status: DC
  Administered 2021-09-30 – 2021-10-16 (×32): 800 mg via ORAL
  Filled 2021-09-30 (×32): qty 2

## 2021-09-30 MED ORDER — DARBEPOETIN ALFA 25 MCG/0.42ML IJ SOSY: 25.0000 ug | PREFILLED_SYRINGE | INTRAMUSCULAR | Status: DC

## 2021-09-30 MED ORDER — ATORVASTATIN CALCIUM 10 MG PO TABS
20.0000 mg | ORAL_TABLET | Freq: Every day | ORAL | Status: DC
  Administered 2021-09-30 – 2021-10-12 (×13): 20 mg via ORAL
  Filled 2021-09-30 (×14): qty 2

## 2021-09-30 MED ORDER — INSULIN ASPART 100 UNIT/ML IJ SOLN
4.0000 [IU] | Freq: Three times a day (TID) | INTRAMUSCULAR | Status: DC
  Administered 2021-09-30 – 2021-10-01 (×2): 4 [IU] via SUBCUTANEOUS

## 2021-09-30 MED ORDER — INSULIN DETEMIR 100 UNIT/ML ~~LOC~~ SOLN
12.0000 [IU] | Freq: Two times a day (BID) | SUBCUTANEOUS | Status: DC
  Administered 2021-09-30 – 2021-10-05 (×10): 12 [IU] via SUBCUTANEOUS
  Filled 2021-09-30 (×11): qty 0.12

## 2021-09-30 MED ORDER — OXYCODONE HCL 5 MG PO TABS
2.5000 mg | ORAL_TABLET | ORAL | Status: DC | PRN
  Administered 2021-09-30 – 2021-10-01 (×3): 2.5 mg via ORAL
  Filled 2021-09-30 (×3): qty 1

## 2021-09-30 MED ORDER — OXYCODONE HCL 5 MG PO TABS: 5.0000 mg | ORAL_TABLET | Freq: Four times a day (QID) | ORAL | 0 refills | Status: DC | PRN

## 2021-09-30 MED ORDER — ADULT MULTIVITAMIN LIQUID CH
15.0000 mL | Freq: Every day | ORAL | Status: DC
  Administered 2021-09-30 – 2021-10-03 (×4): 15 mL via ORAL
  Filled 2021-09-30 (×4): qty 15

## 2021-09-30 MED ORDER — NEPRO/CARBSTEADY PO LIQD: 237.0000 mL | ORAL | Status: DC

## 2021-09-30 MED ORDER — ALPRAZOLAM 0.5 MG PO TABS
0.5000 mg | ORAL_TABLET | Freq: Every day | ORAL | Status: DC
  Administered 2021-10-01 – 2021-10-15 (×15): 0.5 mg via ORAL
  Filled 2021-09-30 (×15): qty 1

## 2021-09-30 MED ORDER — TRAZODONE HCL 50 MG PO TABS
25.0000 mg | ORAL_TABLET | Freq: Every evening | ORAL | Status: DC | PRN
  Administered 2021-09-30 – 2021-10-13 (×5): 25 mg via ORAL
  Filled 2021-09-30 (×5): qty 1

## 2021-09-30 MED ORDER — GENTAMICIN SULFATE 0.1 % EX CREA
1.0000 "application " | TOPICAL_CREAM | Freq: Every day | CUTANEOUS | Status: DC
  Administered 2021-09-30 – 2021-10-01 (×2): 1 via TOPICAL
  Filled 2021-09-30: qty 15

## 2021-09-30 MED ORDER — HEPARIN 1000 UNIT/ML FOR PERITONEAL DIALYSIS
500.0000 [IU] | INTRAMUSCULAR | Status: DC | PRN
  Filled 2021-09-30: qty 0.5

## 2021-09-30 MED ORDER — DELFLEX-LC/1.5% DEXTROSE 344 MOSM/L IP SOLN: INTRAPERITONEAL | Status: DC

## 2021-09-30 MED ORDER — FUROSEMIDE 40 MG PO TABS
80.0000 mg | ORAL_TABLET | Freq: Every day | ORAL | Status: DC
  Administered 2021-09-30 – 2021-10-16 (×17): 80 mg via ORAL
  Filled 2021-09-30 (×17): qty 2

## 2021-09-30 MED ORDER — DELFLEX-LC/2.5% DEXTROSE 394 MOSM/L IP SOLN: Freq: Every day | INTRAPERITONEAL | Status: DC

## 2021-09-30 MED ORDER — MOMETASONE FURO-FORMOTEROL FUM 200-5 MCG/ACT IN AERO
2.0000 | INHALATION_SPRAY | Freq: Two times a day (BID) | RESPIRATORY_TRACT | Status: DC
  Administered 2021-10-01 – 2021-10-16 (×29): 2 via RESPIRATORY_TRACT
  Filled 2021-09-30: qty 8.8

## 2021-09-30 MED ORDER — SPIRONOLACTONE 25 MG PO TABS: 25.0000 mg | ORAL_TABLET | Freq: Every day | ORAL | Status: DC

## 2021-09-30 MED ORDER — NITROGLYCERIN 0.4 MG SL SUBL: 0.4000 mg | SUBLINGUAL_TABLET | SUBLINGUAL | Status: DC | PRN

## 2021-09-30 MED ORDER — ASPIRIN EC 81 MG PO TBEC
81.0000 mg | DELAYED_RELEASE_TABLET | Freq: Two times a day (BID) | ORAL | Status: DC
  Administered 2021-09-30 – 2021-10-16 (×32): 81 mg via ORAL
  Filled 2021-09-30 (×31): qty 1

## 2021-09-30 MED ORDER — POLYETHYLENE GLYCOL 3350 17 G PO PACK
17.0000 g | PACK | Freq: Every day | ORAL | Status: DC | PRN
  Administered 2021-10-02: 17 g via ORAL
  Filled 2021-09-30: qty 1

## 2021-09-30 MED ORDER — METOPROLOL SUCCINATE ER 25 MG PO TB24: 25.0000 mg | ORAL_TABLET | Freq: Every day | ORAL | Status: DC

## 2021-09-30 MED ORDER — ALPRAZOLAM 0.5 MG PO TABS: 0.5000 mg | ORAL_TABLET | Freq: Every day | ORAL | 0 refills | Status: DC

## 2021-09-30 MED ORDER — PROSOURCE PLUS PO LIQD
30.0000 mL | Freq: Every day | ORAL | Status: DC
  Administered 2021-10-01: 08:00:00 30 mL via ORAL
  Filled 2021-09-30: qty 30

## 2021-09-30 MED ORDER — DELFLEX-LC/1.5% DEXTROSE 344 MOSM/L IP SOLN: Freq: Every day | INTRAPERITONEAL | Status: DC

## 2021-09-30 MED ORDER — SPIRONOLACTONE 12.5 MG HALF TABLET
12.5000 mg | ORAL_TABLET | Freq: Every day | ORAL | Status: DC
  Administered 2021-09-30 – 2021-10-10 (×11): 12.5 mg via ORAL
  Filled 2021-09-30 (×11): qty 1

## 2021-09-30 MED ORDER — PANTOPRAZOLE SODIUM 40 MG PO TBEC
40.0000 mg | DELAYED_RELEASE_TABLET | Freq: Two times a day (BID) | ORAL | Status: DC
  Administered 2021-09-30 – 2021-10-16 (×32): 40 mg via ORAL
  Filled 2021-09-30 (×32): qty 1

## 2021-09-30 MED ORDER — HYDROXYZINE HCL 25 MG PO TABS
25.0000 mg | ORAL_TABLET | Freq: Two times a day (BID) | ORAL | Status: DC | PRN
  Administered 2021-09-30 – 2021-10-07 (×2): 25 mg via ORAL
  Filled 2021-09-30 (×2): qty 1

## 2021-09-30 MED ORDER — HEPARIN 1000 UNIT/ML FOR PERITONEAL DIALYSIS
INTRAPERITONEAL | Status: DC | PRN
  Filled 2021-09-30: qty 5000

## 2021-09-30 MED ORDER — VITAMIN D (ERGOCALCIFEROL) 1.25 MG (50000 UNIT) PO CAPS
50000.0000 [IU] | ORAL_CAPSULE | ORAL | Status: DC
  Administered 2021-10-05 – 2021-10-12 (×2): 50000 [IU] via ORAL
  Filled 2021-09-30 (×2): qty 1

## 2021-09-30 NOTE — H&P (Signed)
Physical Medicine and Rehabilitation Admission H&P   CC: Closed right hip fracture  HPI: Andrea Stewart. Cure is a 75 year old right-handed female with history of COPD/tobacco use, hypertension, diabetes mellitus, obesity with BMI 39.27, end-stage renal disease peritoneal dialysis.  Per chart review patient lives with spouse.  1 level home with ramped entrance.  She has 4 sons and an aide to help with ADLs.  Modified independent for mobility prior to admission.  Her husband is currently admitted to [facility] for intraparenchymal hemorrhage.  Presented to [facility] 09/21/2021 after mechanical fall when she stumbled and fell on her right side.  No loss of consciousness.  Denied any presyncope chest pain or palpitations related to the fall.  Cranial CT scan negative.  CT of the right hip showed undisplaced right femoral intertrochanteric fracture as well as CT of the right shoulder showing nondisplaced fracture of the lateral humeral head with involvement of the greater tuberosity.  Old healed fracture of the humeral diaphysis.  Admission chemistry sodium 134 BUN 33 creatinine 7.53, hemoglobin 13.1 WBC 12,900, hemoglobin A1c 9.7.  Orthopedic service follow-up Dr. Earnestine Leys no current surgical intervention and weightbearing as tolerated right upper extremity and weightbearing as tolerated right lower extremity with right upper extremity sling for comfort.  In regards to patient's end-stage renal disease follow-up nephrology services/[facility] with peritoneal dialysis ongoing.  Close monitoring of blood sugars insulin therapy as directed.  Therapy evaluations completed due to patient decreased functional mobility was admitted for a comprehensive rehab program.  Review of Systems  Constitutional:  Negative for chills and fever.  HENT:  Negative for hearing loss.   Eyes:  Negative for blurred vision and double vision.  Respiratory:  Negative for cough and wheezing.        Shortness of  breath with exertion  Cardiovascular:  Positive for leg swelling. Negative for chest pain and palpitations.  Gastrointestinal:  Positive for constipation. Negative for heartburn, nausea and vomiting.  Genitourinary:  Negative for flank pain and hematuria.  Musculoskeletal:  Positive for back pain, falls and myalgias.  Skin:  Negative for rash.  Psychiatric/Behavioral:  Positive for depression. The patient has insomnia.        Anxiety  All other systems reviewed and are negative. Past Medical History:  Diagnosis Date   Backache, unspecified    s/p c-spine and l-spine fusions   COPD (chronic obstructive pulmonary disease) (HCC)    Degeneration of intervertebral disc, site unspecified    Depressive disorder, not elsewhere classified    Disorder of bone and cartilage, unspecified    Family history of adverse reaction to anesthesia    mother did, but patient not sure of the complication   HTN (hypertension)    Hx of left heart catheterization by cutdown    a. 7 years ago; no significant cad; b. Lexiscan 2014: no significant ischemia, no significant EKG changes concerning for ischemia, EF 67%, overall low risk study   Kidney failure    Dr Cyndia Diver   Myalgia and myositis, unspecified    Obesity, unspecified    Personal history of tobacco use, presenting hazards to health    1/2 ppd   Plantar fascial fibromatosis    S/P cholecystectomy    S/p nephrectomy    Type II or unspecified type diabetes mellitus without mention of complication, not stated as uncontrolled    Unspecified essential hypertension    Unspecified hereditary and idiopathic peripheral neuropathy    secondary to diabetes  Past Surgical History:  Procedure Laterality Date   ABDOMINAL HYSTERECTOMY     APPENDECTOMY     BACK SURGERY     1966 and 2006    CARDIAC CATHETERIZATION     o.k. 2003   CARPAL TUNNEL RELEASE     CERVICAL DISCECTOMY     CHOLECYSTECTOMY     COLONOSCOPY  09/1999   colonoscopy-hemorrhoids, re-check  5 years (10/2006)   dexa     osteopenia (01/2004)   DIALYSIS/PERMA CATHETER INSERTION Right 01/05/2021   Procedure: DIALYSIS/PERMA CATHETER INSERTION;  Surgeon: Algernon Huxley, MD;  Location: [place] [facility];  Service: Cardiovascular;  Laterality: Right;   DIALYSIS/PERMA CATHETER REMOVAL N/A 02/05/2021   Procedure: DIALYSIS/PERMA CATHETER REMOVAL;  Surgeon: Algernon Huxley, MD;  Location: [place] [facility];  Service: Cardiovascular;  Laterality: N/A;   ELBOW SURGERY     ESOPHAGOGASTRODUODENOSCOPY (EGD) WITH PROPOFOL N/A 07/23/2020   Procedure: ESOPHAGOGASTRODUODENOSCOPY (EGD) WITH PROPOFOL;  Surgeon: Robert Bellow, MD;  Location: [facility] END[facility];  Service: Endoscopy;  Laterality: N/A;  TRAVEL TO O.R. - PATIENT TO HAVE SURGERY   FEMUR IM NAIL Left 01/01/2021   Procedure: INTRAMEDULLARY (IM) NAIL FEMORAL;  Surgeon: Lovell Sheehan, MD;  Location: [facility];  Service: Orthopedics;  Laterality: Left;   FOOT SURGERY     x 3   HIP FRACTURE SURGERY Left    JOINT REPLACEMENT     KIDNEY DONATION  1991   LAPAROSCOPIC ABDOMINAL EXPLORATION N/A 07/23/2020   Procedure: LAPAROSCOPIC ABDOMINAL EXPLORATION;  Surgeon: Robert Bellow, MD;  Location: [facility];  Service: General;  Laterality: N/A;   LUMBAR FUSION     NECK SURGERY     06/2005   Problem with general Anesthesia     02/2006   REPLACEMENT TOTAL KNEE  10/2004   trigger finger surgery     UPPER GI ENDOSCOPY N/A 07/23/2020   Procedure: UPPER GI ENDOSCOPY;  Surgeon: Robert Bellow, MD;  Location: [facility] ORS;  Service: General;  Laterality: N/A;   VENTRAL HERNIA REPAIR N/A 09/08/2017   Primary repair of a 10 x 15 mm infraumbilical fascial defect.  Surgeon: Robert Bellow, MD;  Location: [facility];  Service: General;  Laterality: N/A;   VESICOVAGINAL FISTULA CLOSURE W/ TAH     VIDEO BRONCHOSCOPY Bilateral 04/02/2015   Procedure: VIDEO BRONCHOSCOPY WITHOUT FLUORO;  Surgeon: Juanito Doom, MD;  Location: [facility] ENDOSCOPY;  Service:  Cardiopulmonary;  Laterality: Bilateral;   Family History  Problem Relation Age of Onset   Hypertension Mother    Colon cancer Mother    Cancer Mother        colon   Coronary artery disease Mother    Diabetes Mother    Prostate cancer Father    COPD Father    Cancer Father        prostate   Heart attack Brother 38   Sleep apnea Brother    Diabetes Brother    Diabetes Paternal Aunt    Diabetes Paternal Uncle    Cancer Daughter        Brain, lungs, liver, spine, kidney   Social History:  reports that she has been smoking cigarettes. She has a 7.50 pack-year smoking history. She has never used smokeless tobacco. She reports that she does not drink alcohol and does not use drugs. Allergies:  Allergies  Allergen Reactions   Morphine Anaphylaxis   Prednisone Other (See Comments)    "makes me think I am having a heart attack"  Gabapentin Other (See Comments)    intolerance   Hydralazine Itching   Nsaids Diarrhea   Penicillins Swelling    TOLERATED CEFAZOLIN 07/2020 Has patient had a PCN reaction causing immediate rash, facial/tongue/throat swelling, SOB or lightheadedness with hypotension: Yes Has patient had a PCN reaction causing severe rash involving mucus membranes or skin necrosis: No Has patient had a PCN reaction that required hospitalization: No Has patient had a PCN reaction occurring within the last 10 years: No If all of the above answers are "NO", then may proceed with Cephalosporin use.    Facility-Administered Medications Prior to Admission  Medication Dose Route Frequency Provider Last Rate Last Admin   albuterol (PROVENTIL HFA;VENTOLIN HFA) 108 (90 Base) MCG/ACT inhaler 2 puff  2 puff Inhalation Q4H PRN Byrnett, Forest Gleason, MD       Medications Prior to Admission  Medication Sig Dispense Refill   acetaminophen (TYLENOL) 325 MG tablet Take 2 tablets (650 mg total) by mouth every 6 (six) hours as needed for mild pain or headache (headache).     ALPRAZolam  (XANAX) 0.5 MG tablet Take 1 tablet (0.5 mg total) by mouth at bedtime for 2 days. CIR use only 2 tablet 0   aspirin EC 81 MG EC tablet Take 1 tablet (81 mg total) by mouth 2 (two) times daily. Swallow whole. 30 tablet 11   atorvastatin (LIPITOR) 20 MG tablet TAKE 1 TABLET BY MOUTH DAILY (Patient taking differently: Take 20 mg by mouth daily with supper.) 90 tablet 1   budesonide-formoterol (SYMBICORT) 160-4.5 MCG/ACT inhaler Inhale 2 puffs into the lungs daily.     Continuous Blood Gluc Receiver (Graham) DEVI See admin instructions.     ergocalciferol (VITAMIN D2) 1.25 MG (50000 UT) capsule Take by mouth once a week.     furosemide (LASIX) 80 MG tablet Take 1 tablet (80 mg total) by mouth daily. 90 tablet 3   hydrOXYzine (ATARAX/VISTARIL) 25 MG tablet Take 1 tablet (25 mg total) by mouth 2 (two) times daily as needed for anxiety. 30 tablet 0   insulin NPH Human (NOVOLIN N) 100 UNIT/ML injection Novolin N NPH U-100 Insulin isophane 100 unit/mL subcutaneous susp  INJECT 20 UNITS IN THE MORNING AND 35 UNITS IN THE EVENING. MAX DAILY DOSE OF 100 UNITS     insulin regular human CONCENTRATED (HUMULIN R U-500 KWIKPEN) 500 UNIT/ML KwikPen Inject into the skin as needed.     Magnesium 500 MG TABS 1 tablet with a meal     metoprolol succinate (TOPROL-XL) 25 MG 24 hr tablet Take 1 tablet (25 mg total) by mouth daily. Take with or immediately following a meal. 90 tablet 3   Multiple Vitamins-Minerals (KP WOMENS 50+ DAILY FORMULA) TABS Take by mouth daily.     nitroGLYCERIN (NITROSTAT) 0.4 MG SL tablet Place 1 tablet (0.4 mg total) under the tongue every 5 (five) minutes x 3 doses as needed for chest pain. Maximum 3 tablets 20 tablet 3   Nutritional Supplements (,FEEDING SUPPLEMENT, PROSOURCE PLUS) liquid Take 30 mLs by mouth daily.     Nutritional Supplements (FEEDING SUPPLEMENT, NEPRO CARB STEADY,) LIQD Take 237 mLs by mouth daily.  0   ondansetron (ZOFRAN) 4 MG tablet Take 1 tablet (4 mg total)  by mouth every 6 (six) hours as needed for nausea. 20 tablet 0   oxyCODONE (ROXICODONE) 5 MG immediate release tablet Take 1-2 tablets (5-10 mg total) by mouth every 6 (six) hours as needed for up to 3 days for  moderate pain or severe pain. CIR use only 12 tablet 0   pantoprazole (PROTONIX) 40 MG tablet Take 40 mg by mouth 2 (two) times daily.     polyethylene glycol (MIRALAX / GLYCOLAX) 17 g packet Take 17 g by mouth daily. 14 each 0   Sennosides (SENNA) 8.6 MG CAPS Take 1 capsule by mouth at bedtime as needed.     Spacer/Aero-Holding Chambers (AEROCHAMBER MV) inhaler Use as instructed 1 each 0   spironolactone (ALDACTONE) 25 MG tablet Take 25 mg by mouth daily.      Drug Regimen Review Drug regimen was reviewed and remains appropriate with no significant issues identified  Home: Home Living Family/patient expects to be discharged to:: Private residence Living Arrangements: Spouse/significant other Available Help at Discharge: Family, Available 24 hours/day (children and their families to provide 24/7 assist of the couple after CIR) Type of Home: House Home Access: Ramped entrance Home Layout: One level Bathroom Shower/Tub: Multimedia programmer: Handicapped height Bathroom Accessibility: Yes Home Equipment: Rollator (4 wheels), BSC/3in1, Wheelchair - manual Additional Comments: Patietn on home peritoneal dialysis. Her spouse is her trained partner and does most of the setup but he is also a CIR patient at the same time as she  Lives With: Spouse   Functional History: Prior Function Prior Level of Function : Needs assist Physical Assist : Mobility (physical) Mobility (physical): Transfers Mobility Comments: Pt reports she utilizes wheelchair to get in and out of house utilizing ramped entrance. She reports her husband provided assistance with this task prior to his recent fall. 9JJ for houshold mobility. Reports 2 falls in 3 months   Functional Status:  Mobility: Bed  Mobility Overal bed mobility: Needs Assistance Bed Mobility: Supine to Sit Supine to sit: Mod assist, HOB elevated Sit to supine: Max assist General bed mobility comments: MaxA to transition sup to sit on Left side of bed Transfers Overall transfer level: Needs assistance Equipment used: Hemi-walker Transfers: Sit to/from Stand Sit to Stand: Min assist, Min guard Bed to/from chair/wheelchair/BSC transfer type:: Stand pivot Stand pivot transfers: Min assist Step pivot transfers: Mod assist General transfer comment:  (Good demonstration of safety awareness) Ambulation/Gait Ambulation/Gait assistance: Min assist Gait Distance (Feet): 15 Feet Assistive device: Hemi-walker Gait Pattern/deviations: Step-to pattern, Decreased step length - right, Decreased weight shift to right, Antalgic, Trunk flexed General Gait Details: deferred due to weakness and fatigue today.  poor ability to step in place. Gait velocity: decreased   ADL: ADL Overall ADL's : Needs assistance/impaired General ADL Comments: MAX A don sling seated EOB. MIN A + HW for ADL t/f.   Cognition: Cognition Overall Cognitive Status: Within Functional Limits for tasks assessed Orientation Level: Oriented X4 Cognition Arousal/Alertness: Awake/alert Behavior During Therapy: WFL for tasks assessed/performed Overall Cognitive Status: Within Functional Limits for tasks assessed General Comments: pt is A and O x 4. Frustrated with current situation and upset with nephrology about recommendation of HD versus PD  Physical Exam: Blood pressure 103/67, pulse (!) 106, temperature 97.8 F (36.6 C), temperature source Oral, resp. rate 17, SpO2 100 %. Gen: no distress, normal appearing HEENT: oral mucosa pink and moist, NCAT Cardio: Tachycardia Chest: normal effort, normal rate of breathing Abd: soft, non-distended Ext: no edema Psych: pleasant, normal affect Skin:    Comments: PD catheter in place  Neurological:      Comments: Patient is alert.  No acute distress.  Provides name and age.  Follows simple commands. Right upper and lower extremity strength pain limited.  Strength otherwise intact. MSK: Right upper extremity sling in place  Results for orders placed or performed during the hospital encounter of 09/21/21 (from the past 48 hour(s))  Glucose, capillary     Status: Abnormal   Collection Time: 09/28/21  4:22 PM  Result Value Ref Range   Glucose-Capillary 271 (H) 70 - 99 mg/dL    Comment: Glucose reference range applies only to samples taken after fasting for at least 8 hours.   Comment 1 Notify RN    Comment 2 Document in Chart   Glucose, capillary     Status: Abnormal   Collection Time: 09/28/21  8:36 PM  Result Value Ref Range   Glucose-Capillary 234 (H) 70 - 99 mg/dL    Comment: Glucose reference range applies only to samples taken after fasting for at least 8 hours.  Basic metabolic panel     Status: Abnormal   Collection Time: 09/29/21  6:00 AM  Result Value Ref Range   Sodium 130 (L) 135 - 145 mmol/L   Potassium 4.0 3.5 - 5.1 mmol/L   Chloride 97 (L) 98 - 111 mmol/L   CO2 27 22 - 32 mmol/L   Glucose, Bld 268 (H) 70 - 99 mg/dL    Comment: Glucose reference range applies only to samples taken after fasting for at least 8 hours.   BUN 45 (H) 8 - 23 mg/dL   Creatinine, Ser 6.13 (H) 0.44 - 1.00 mg/dL   Calcium 8.3 (L) 8.9 - 10.3 mg/dL   GFR, Estimated 7 (L) >60 mL/min    Comment: (NOTE) Calculated using the CKD-EPI Creatinine Equation (2021)    Anion gap 6 5 - 15    Comment: Performed at [facility], [address] [place], [place] [address]  CBC     Status: Abnormal   Collection Time: 09/29/21  6:00 AM  Result Value Ref Range   WBC 6.3 4.0 - 10.5 K/uL   RBC 3.38 (L) 3.87 - 5.11 MIL/uL   Hemoglobin 10.3 (L) 12.0 - 15.0 g/dL   HCT 31.3 (L) 36.0 - 46.0 %   MCV 92.6 80.0 - 100.0 fL   MCH 30.5 26.0 - 34.0 pg   MCHC 32.9 30.0 - 36.0 g/dL   RDW 14.5 11.5 - 15.5 %    Platelets 271 150 - 400 K/uL   nRBC 0.0 0.0 - 0.2 %    Comment: Performed at [facility], [address], [place], [place] [address]  Magnesium     Status: Abnormal   Collection Time: 09/29/21  6:00 AM  Result Value Ref Range   Magnesium 1.5 (L) 1.7 - 2.4 mg/dL    Comment: Performed at [facility], [address] [place], [place] [address]  Glucose, capillary     Status: Abnormal   Collection Time: 09/29/21  7:48 AM  Result Value Ref Range   Glucose-Capillary 231 (H) 70 - 99 mg/dL    Comment: Glucose reference range applies only to samples taken after fasting for at least 8 hours.  Glucose, capillary     Status: Abnormal   Collection Time: 09/29/21 11:58 AM  Result Value Ref Range   Glucose-Capillary 242 (H) 70 - 99 mg/dL    Comment: Glucose reference range applies only to samples taken after fasting for at least 8 hours.  Glucose, capillary     Status: Abnormal   Collection Time: 09/29/21  4:00 PM  Result Value Ref Range   Glucose-Capillary 245 (H) 70 - 99 mg/dL  Comment: Glucose reference range applies only to samples taken after fasting for at least 8 hours.  Glucose, capillary     Status: Abnormal   Collection Time: 09/29/21  9:44 PM  Result Value Ref Range   Glucose-Capillary 285 (H) 70 - 99 mg/dL    Comment: Glucose reference range applies only to samples taken after fasting for at least 8 hours.   Comment 1 Notify RN   Basic metabolic panel     Status: Abnormal   Collection Time: 09/30/21  2:29 AM  Result Value Ref Range   Sodium 128 (L) 135 - 145 mmol/L   Potassium 3.9 3.5 - 5.1 mmol/L   Chloride 96 (L) 98 - 111 mmol/L   CO2 27 22 - 32 mmol/L   Glucose, Bld 323 (H) 70 - 99 mg/dL    Comment: Glucose reference range applies only to samples taken after fasting for at least 8 hours.   BUN 48 (H) 8 - 23 mg/dL   Creatinine, Ser 5.84 (H) 0.44 - 1.00 mg/dL   Calcium 8.3 (L) 8.9 - 10.3 mg/dL   GFR, Estimated 7 (L) >60 mL/min    Comment:  (NOTE) Calculated using the CKD-EPI Creatinine Equation (2021)    Anion gap 5 5 - 15    Comment: Performed at [facility], [address] [place], [place] [address]  CBC     Status: Abnormal   Collection Time: 09/30/21  2:29 AM  Result Value Ref Range   WBC 7.1 4.0 - 10.5 K/uL   RBC 3.28 (L) 3.87 - 5.11 MIL/uL   Hemoglobin 9.9 (L) 12.0 - 15.0 g/dL   HCT 30.0 (L) 36.0 - 46.0 %   MCV 91.5 80.0 - 100.0 fL   MCH 30.2 26.0 - 34.0 pg   MCHC 33.0 30.0 - 36.0 g/dL   RDW 14.3 11.5 - 15.5 %   Platelets 307 150 - 400 K/uL   nRBC 0.0 0.0 - 0.2 %    Comment: Performed at [facility], [address], [place], [place] [address]  Magnesium     Status: Abnormal   Collection Time: 09/30/21  2:29 AM  Result Value Ref Range   Magnesium 1.6 (L) 1.7 - 2.4 mg/dL    Comment: Performed at [facility], [address] [place], [place] [address]  Glucose, capillary     Status: Abnormal   Collection Time: 09/30/21  7:57 AM  Result Value Ref Range   Glucose-Capillary 309 (H) 70 - 99 mg/dL    Comment: Glucose reference range applies only to samples taken after fasting for at least 8 hours.   Comment 1 Notify RN    Comment 2 Document in Chart   Glucose, capillary     Status: Abnormal   Collection Time: 09/30/21 11:19 AM  Result Value Ref Range   Glucose-Capillary 243 (H) 70 - 99 mg/dL    Comment: Glucose reference range applies only to samples taken after fasting for at least 8 hours.   Comment 1 Notify RN    Comment 2 Document in Chart    No results found.     Medical Problem List and Plan: 1.  Nondisplaced right hip greater trochanteric fracture as well as right nondisplaced fracture of the lateral humeral head secondary to fall.  Nonoperative.  Follow-up Dr. Earnestine Leys.  Weightbearing as tolerated.  Shoulder sling for comfort.  -patient may shower  -ELOS/Goals: 7-10 days modI  -check vitamin D level tomorrow morning 2.  Antithrombotics: -DVT/anticoagulation:  Mechanical:  Antiembolism stockings, knee (TED hose) Bilateral lower extremities.  Check vascular study  -antiplatelet therapy: N/A 3. Pain Management: Oxycodone as needed 4. Mood: Xanax 0.5 mg nightly, Vistaril 25 mg twice daily as needed, trazodone as needed  -antipsychotic agents: N/A 5. Neuropsych: This patient is capable of making decisions on her own behalf. 6. Skin/Wound Care: Routine skin checks 7. Fluids/Electrolytes/Nutrition: Routine in and outs with follow-up chemistries 8.  End-stage renal disease.  Continue peritoneal dialysis 9.  Anemia of chronic disease.  Follow-up CBC 10.  Hypertension, but currently soft. Given increase in metoprolol for heart rate, will decrease Aldactone to 12.5 mg daily, Toprol-XL 25 mg daily, Lasix 80 mg daily.  Monitor with increased mobility 11.  Diabetes mellitus.  Hemoglobin A1c 9.7.  NovoLog 4 units 3 times daily, Levemir 12 units twice daily.  Check blood sugars before meals and at bedtime 12.  Hyperlipidemia.  Lipitor 13.  COPD with tobacco abuse.  Continue nebulizers as directed.  Check oxygen saturations every shift.  Provide counsel guards to cessation of nicotine products. 14.  Obesity.  BMI 39.27.  Dietary follow-up 15. Tachycardia: increase lopressor to 37.5mg  16. Hypomagnesemia: check magnesium level tomorrow morning  Izora Ribas, MD 09/30/2021   Lavon Paganini Angiulli, PA-C

## 2021-09-30 NOTE — Discharge Summary (Signed)
Physician Discharge Summary  Andrea Stewart HAL:[id number] DOB: 07-19-1946 DOA: 09/21/2021  PCP: Rusty Aus, MD  Admit date: 09/21/2021 Discharge date: 09/30/2021  Admitted From: HOME  Disposition:  CIR  Recommendations for Outpatient Follow-up:  Follow up with PCP in 1-2 weeks Follow up ortho 2 weeks  Home Health:No Equipment/Devices:None   Discharge Condition:Stable  CODE STATUS:FULL  Diet recommendation: Carb modified  Brief/Interim Summary:  Andrea Stewart is a 75 y.o. obese Caucasian female with medical history significant for COPD, depression, hypertension, type 2 diabetes mellitus end-stage renal disease on peritoneal dialysis and obesity, who presented to the emergency room with acute onset of mechanical fall when she was getting in her bedroom to her portable toilet seat and stumbled and fell on her right side with subsequent right shoulder and hip pain.  Imaging showed a nondisplaced fracture of the lateral humeral head and an undisplaced right femoral intratrochanteric fracture.   Discharge Diagnoses:  Active Problems:   Closed right hip fracture (HCC)  Mechanical Fall Right nondisplaced femoral intratrochanteric fracture Right Nondisplaced Fracture of Lateral Humeral Head with involvement of the Greater Tuberosity Ortho c/s, appreciate recs, Dr. Sabra Heck recommending nonoperative management  --RUE sling --follow up with ortho in clinic in 2 weeks --oral pain regimen --d/c to CIR   ESRD on peritoneal dialysis  --PD cath finally starting working well --cont PD --Outpatient nephrology followup   Hypertension --well controlled on current regimen --cont lasix, spironolactone and metop   T2DM c/b peripheral neuropathy --recurrent hypoglycemia during evening checks. --switched from Novolin to Levemir on 11/13. Plan: Resume home diabetic regimen at time of discharge  Dyslipidemia --cont statin   COPD  --cont daily bronchodilator   Anxiety --cont  home Xanax nightly  Discharge Instructions  Discharge Instructions     Diet - low sodium heart healthy   Complete by: As directed    Increase activity slowly   Complete by: As directed    No wound care   Complete by: As directed       Allergies as of 09/30/2021       Reactions   Morphine Anaphylaxis   Prednisone Other (See Comments)   "makes me think I am having a heart attack"   Gabapentin Other (See Comments)   intolerance   Hydralazine Itching   Nsaids Diarrhea   Penicillins Swelling   TOLERATED CEFAZOLIN 07/2020 Has patient had a PCN reaction causing immediate rash, facial/tongue/throat swelling, SOB or lightheadedness with hypotension: Yes Has patient had a PCN reaction causing severe rash involving mucus membranes or skin necrosis: No Has patient had a PCN reaction that required hospitalization: No Has patient had a PCN reaction occurring within the last 10 years: No If all of the above answers are "NO", then may proceed with Cephalosporin use.        Medication List     STOP taking these medications    vancomycin 125 MG capsule Commonly known as: VANCOCIN       TAKE these medications    (feeding supplement) PROSource Plus liquid Take 30 mLs by mouth daily.   feeding supplement (NEPRO CARB STEADY) Liqd Take 237 mLs by mouth daily.   acetaminophen 325 MG tablet Commonly known as: TYLENOL Take 2 tablets (650 mg total) by mouth every 6 (six) hours as needed for mild pain or headache (headache).   AeroChamber MV inhaler Use as instructed   ALPRAZolam 0.5 MG tablet Commonly known as: XANAX Take 1 tablet (0.5 mg total) by mouth at bedtime  for 2 days. CIR use only What changed: additional instructions   aspirin 81 MG EC tablet Take 1 tablet (81 mg total) by mouth 2 (two) times daily. Swallow whole.   atorvastatin 20 MG tablet Commonly known as: LIPITOR TAKE 1 TABLET BY MOUTH DAILY What changed: when to take this   budesonide-formoterol 160-4.5  MCG/ACT inhaler Commonly known as: SYMBICORT Inhale 2 puffs into the lungs daily.   Dexcom G6 Receiver Kerrin Mo See admin instructions.   ergocalciferol 1.25 MG (50000 UT) capsule Commonly known as: VITAMIN D2 Take by mouth once a week.   furosemide 80 MG tablet Commonly known as: LASIX Take 1 tablet (80 mg total) by mouth daily.   HumuLIN R U-500 KwikPen 500 UNIT/ML KwikPen Generic drug: insulin regular human CONCENTRATED Inject into the skin as needed.   hydrOXYzine 25 MG tablet Commonly known as: ATARAX/VISTARIL Take 1 tablet (25 mg total) by mouth 2 (two) times daily as needed for anxiety.   insulin NPH Human 100 UNIT/ML injection Commonly known as: NOVOLIN N Novolin N NPH U-100 Insulin isophane 100 unit/mL subcutaneous susp  INJECT 20 UNITS IN THE MORNING AND 35 UNITS IN THE EVENING. MAX DAILY DOSE OF 100 UNITS   KP Womens 50+ Daily Formula Tabs Take by mouth daily.   Magnesium 500 MG Tabs 1 tablet with a meal   metoprolol succinate 25 MG 24 hr tablet Commonly known as: TOPROL-XL Take 1 tablet (25 mg total) by mouth daily. Take with or immediately following a meal.   nitroGLYCERIN 0.4 MG SL tablet Commonly known as: NITROSTAT Place 1 tablet (0.4 mg total) under the tongue every 5 (five) minutes x 3 doses as needed for chest pain. Maximum 3 tablets   ondansetron 4 MG tablet Commonly known as: ZOFRAN Take 1 tablet (4 mg total) by mouth every 6 (six) hours as needed for nausea.   oxyCODONE 5 MG immediate release tablet Commonly known as: Roxicodone Take 1-2 tablets (5-10 mg total) by mouth every 6 (six) hours as needed for up to 3 days for moderate pain or severe pain. CIR use only   pantoprazole 40 MG tablet Commonly known as: PROTONIX Take 40 mg by mouth 2 (two) times daily.   polyethylene glycol 17 g packet Commonly known as: MIRALAX / GLYCOLAX Take 17 g by mouth daily.   Senna 8.6 MG Caps Take 1 capsule by mouth at bedtime as needed.   spironolactone 25  MG tablet Commonly known as: ALDACTONE Take 25 mg by mouth daily.        Follow-up Information     Earnestine Leys, MD Follow up in 2 week(s).   Specialty: Orthopedic Surgery Why: Please make an appointment for the patient to be seen in my office in 2 weeks after discharge. Contact information: [place] [address] [phone]                Allergies  Allergen Reactions   Morphine Anaphylaxis   Prednisone Other (See Comments)    "makes me think I am having a heart attack"   Gabapentin Other (See Comments)    intolerance   Hydralazine Itching   Nsaids Diarrhea   Penicillins Swelling    TOLERATED CEFAZOLIN 07/2020 Has patient had a PCN reaction causing immediate rash, facial/tongue/throat swelling, SOB or lightheadedness with hypotension: Yes Has patient had a PCN reaction causing severe rash involving mucus membranes or skin necrosis: No Has patient had a PCN reaction that required hospitalization: No Has patient had  a PCN reaction occurring within the last 10 years: No If all of the above answers are "NO", then may proceed with Cephalosporin use.     Consultations: Ortho Nephro   Procedures/Studies: DG Shoulder Right  Result Date: 09/20/2021 CLINICAL DATA:  Recent fall with shoulder pain, initial encounter EXAM: RIGHT SHOULDER - 2+ VIEW COMPARISON:  03/26/2009 FINDINGS: There are changes consistent with prior healed right proximal humeral fracture. Degenerative changes of the acromioclavicular joint are seen. No acute fracture or dislocation is noted. The underlying bony thorax is within normal limits. Postsurgical changes in the cervical spine are seen. IMPRESSION: Healed prior proximal right humeral fracture. No acute abnormality noted. Electronically Signed   By: Inez Catalina M.D.   On: 09/20/2021 23:30   DG Elbow 2 Views Right  Result Date: 09/21/2021 CLINICAL DATA:  Lost balance on toilet leading to fall. Right elbow pain. EXAM:  RIGHT ELBOW - 2 VIEW COMPARISON:  None. FINDINGS: There is no evidence of fracture, dislocation, or joint effusion. Mild degenerative change with spurring. Enthesopathic change about the lateral epicondyle. Soft tissues are unremarkable. Vascular calcifications are seen. IMPRESSION: No fracture or subluxation of the right elbow. Electronically Signed   By: Keith Rake M.D.   On: 09/21/2021 01:56   CT HEAD WO CONTRAST (5MM)  Result Date: 09/21/2021 CLINICAL DATA:  Recent fall with headaches, initial encounter EXAM: CT HEAD WITHOUT CONTRAST TECHNIQUE: Contiguous axial images were obtained from the base of the skull through the vertex without intravenous contrast. COMPARISON:  12/31/2015 FINDINGS: Brain: No evidence of acute infarction, hemorrhage, hydrocephalus, extra-axial collection or mass lesion/mass effect. Mild atrophic and chronic white matter ischemic changes are noted. Vascular: No hyperdense vessel or unexpected calcification. Skull: Normal. Negative for fracture or focal lesion. Sinuses/Orbits: No acute finding. Other: None. IMPRESSION: Chronic atrophic and ischemic changes without acute abnormality. Electronically Signed   By: Inez Catalina M.D.   On: 09/21/2021 00:25   CT Shoulder Right Wo Contrast  Result Date: 09/21/2021 CLINICAL DATA:  Right shoulder fracture.  Fall. EXAM: CT OF THE UPPER RIGHT EXTREMITY WITHOUT CONTRAST TECHNIQUE: Multidetector CT imaging of the upper right extremity was performed according to the standard protocol. COMPARISON:  Radiograph dated 09/21/2021. FINDINGS: Bones/Joint/Cartilage There is a nondisplaced fracture of the lateral humeral head with involvement of the greater tuberosity. Evaluation of the fracture is very limited due to advanced osteopenia. Old healed fracture of the humeral diaphysis noted. There is degenerative changes of the shoulder with spurring. No dislocation. No significant joint effusion. Probable chronic thickening of the joint capsule.  Ligaments Suboptimally assessed by CT. Muscles and Tendons No acute findings.  No intramuscular hematoma. Soft tissues The soft tissues are unremarkable. IMPRESSION: 1. Nondisplaced fracture of the lateral humeral head with involvement of the greater tuberosity. Evaluation of the fracture is very limited due to advanced osteopenia. 2. Old healed fracture of the humeral diaphysis. 3. Degenerative changes of the shoulder with spurring. Electronically Signed   By: Anner Crete M.D.   On: 09/21/2021 03:17   CT Hip Right Wo Contrast  Result Date: 09/21/2021 CLINICAL DATA:  Recent fall with findings suspicious for hip fracture with negative x-rays, initial encounter EXAM: CT OF THE RIGHT HIP WITHOUT CONTRAST TECHNIQUE: Multidetector CT imaging of the right hip was performed according to the standard protocol. Multiplanar CT image reconstructions were also generated. COMPARISON:  Plain film from earlier in the same day. FINDINGS: Bones/Joint/Cartilage Postsurgical changes are noted in the lower lumbar spine. Degenerative changes of the sacroiliac  joints are seen. There are findings consistent with a right intratrochanteric femoral fracture without significant displacement. No femoral neck fracture is seen. No dislocation is noted. Chronic coccygeal fracture is noted. The remainder of the bony pelvis shows no acute abnormality. Changes of prior ORIF of proximal left femoral fracture are seen. Ligaments Suboptimally assessed by CT. Muscles and Tendons Surrounding musculature appears within normal limits. Soft tissues Peritoneal dialysis catheter is noted deep in the pelvis. Bladder is decompressed. No free fluid is seen. Visualized bowel structures are unremarkable. IMPRESSION: Undisplaced right femoral intratrochanteric fracture. Chronic changes as described above. Electronically Signed   By: Inez Catalina M.D.   On: 09/21/2021 00:32   DG Humerus Right  Result Date: 09/21/2021 CLINICAL DATA:  Lost balance leading  to fall.  Right arm pain. EXAM: RIGHT HUMERUS - 2+ VIEW COMPARISON:  Shoulder radiograph yesterday FINDINGS: Possible fracture arising from the lateral humeral head, not definitively seen on prior shoulder exam. Remote healed proximal humeral shaft fracture. No focal soft tissue abnormalities. IMPRESSION: 1. Possible fracture arising from the lateral humeral head. This was not confirmed on concurrent shoulder exam. If there is focal tenderness in this region, consider CT. 2. Remote healed proximal humeral shaft fracture. Electronically Signed   By: Keith Rake M.D.   On: 09/21/2021 01:58   MM 3D SCREEN BREAST BILATERAL  Result Date: 09/09/2021 CLINICAL DATA:  Screening. EXAM: DIGITAL SCREENING BILATERAL MAMMOGRAM WITH TOMOSYNTHESIS AND CAD TECHNIQUE: Bilateral screening digital craniocaudal and mediolateral oblique mammograms were obtained. Bilateral screening digital breast tomosynthesis was performed. The images were evaluated with computer-aided detection. COMPARISON:  Previous exam(s). ACR Breast Density Category b: There are scattered areas of fibroglandular density. FINDINGS: There are no findings suspicious for malignancy. IMPRESSION: No mammographic evidence of malignancy. A result letter of this screening mammogram will be mailed directly to the patient. RECOMMENDATION: Screening mammogram in one year. (Code:[id number]) BI-RADS CATEGORY  1: Negative. Electronically Signed   By: Ammie Ferrier M.D.   On: 09/09/2021 12:15  DG Hip Unilat  With Pelvis 2-3 Views Right  Result Date: 09/20/2021 CLINICAL DATA:  Fall injury. EXAM: DG HIP (WITH OR WITHOUT PELVIS) 2-3V RIGHT COMPARISON:  Similar study 03/06/2014 FINDINGS: There is osteopenia without evidence of displaced fractures. There is mild joint space loss and spurring at the hips, spurring and sclerosis of SI joints and pubic symphysis and enthesopathic changes in the pelvis. There has been an interval left hip nailing which appears well-healed.  Externally inserted tubing entering from the right is coiled in the pelvic floor. Again noted are changes of prior L4-5 and L5-S1 discectomy with interbody metallic cylinder grafts. The iliofemoral arteries are heavily calcified. IMPRESSION: Osteopenia, degenerative and postsurgical changes, without evidence of fractures. Progressively heavy vascular calcifications. Additional findings discussed above. Electronically Signed   By: Telford Nab M.D.   On: 09/20/2021 23:32      Subjective: Seen and examined on day of discharge.  Still with pain in RUE.  Otherwise stable for dc to CIR  Discharge Exam: Vitals:   09/30/21 0525 09/30/21 0753  BP: 122/73 (!) 142/63  Pulse: (!) 104 98  Resp: 20 16  Temp: 98.6 F (37 C) 97.6 F (36.4 C)  SpO2: 96% 97%   Vitals:   09/29/21 2014 09/30/21 0001 09/30/21 0525 09/30/21 0753  BP: 122/77 114/74 122/73 (!) 142/63  Pulse: (!) 106 90 (!) 104 98  Resp: 18 20 20 16   Temp: 97.9 F (36.6 C) 98.3 F (36.8 C) 98.6 F (37  C) 97.6 F (36.4 C)  TempSrc:      SpO2: 99% 93% 96% 97%  Weight:      Height:        General: Pt is alert, awake, not in acute distress Cardiovascular: RRR, S1/S2 +, no rubs, no gallops Respiratory: CTA bilaterally, no wheezing, no rhonchi Abdominal: Soft, NT, ND, bowel sounds + Extremities: limited ROM RUE    The results of significant diagnostics from this hospitalization (including imaging, microbiology, ancillary and laboratory) are listed below for reference.     Microbiology: Recent Results (from the past 240 hour(s))  Resp Panel by RT-PCR (Flu A&B, Covid) Nasopharyngeal Swab     Status: None   Collection Time: 09/21/21  2:25 AM   Specimen: Nasopharyngeal Swab; Nasopharyngeal(NP) swabs in vial transport medium  Result Value Ref Range Status   SARS Coronavirus 2 by RT PCR NEGATIVE NEGATIVE Final    Comment: (NOTE) SARS-CoV-2 target nucleic acids are NOT DETECTED.  The SARS-CoV-2 RNA is generally detectable in  upper respiratory specimens during the acute phase of infection. The lowest concentration of SARS-CoV-2 viral copies this assay can detect is 138 copies/mL. A negative result does not preclude SARS-Cov-2 infection and should not be used as the sole basis for treatment or other patient management decisions. A negative result may occur with  improper specimen collection/handling, submission of specimen other than nasopharyngeal swab, presence of viral mutation(s) within the areas targeted by this assay, and inadequate number of viral copies(<138 copies/mL). A negative result must be combined with clinical observations, patient history, and epidemiological information. The expected result is Negative.  Fact Sheet for Patients:  EntrepreneurPulse.com.au  Fact Sheet for Healthcare Providers:  IncredibleEmployment.be  This test is no t yet approved or cleared by the [place] FDA and  has been authorized for detection and/or diagnosis of SARS-CoV-2 by FDA under an Emergency Use Authorization (EUA). This EUA will remain  in effect (meaning this test can be used) for the duration of the COVID-19 declaration under Section 564(b)(1) of the Act, 21 U.S.C.section 360bbb-3(b)(1), unless the authorization is terminated  or revoked sooner.       Influenza A by PCR NEGATIVE NEGATIVE Final   Influenza B by PCR NEGATIVE NEGATIVE Final    Comment: (NOTE) The Xpert Xpress SARS-CoV-2/FLU/RSV plus assay is intended as an aid in the diagnosis of influenza from Nasopharyngeal swab specimens and should not be used as a sole basis for treatment. Nasal washings and aspirates are unacceptable for Xpert Xpress SARS-CoV-2/FLU/RSV testing.  Fact Sheet for Patients: EntrepreneurPulse.com.au  Fact Sheet for Healthcare Providers: IncredibleEmployment.be  This test is not yet approved or cleared by the [place] FDA and has been  authorized for detection and/or diagnosis of SARS-CoV-2 by FDA under an Emergency Use Authorization (EUA). This EUA will remain in effect (meaning this test can be used) for the duration of the COVID-19 declaration under Section 564(b)(1) of the Act, 21 U.S.C. section 360bbb-3(b)(1), unless the authorization is terminated or revoked.  Performed at [facility], [address], [place], [place] [address]   Surgical pcr screen     Status: None   Collection Time: 09/21/21 10:15 AM   Specimen: Nasal Mucosa; Nasal Swab  Result Value Ref Range Status   MRSA, PCR NEGATIVE NEGATIVE Final   Staphylococcus aureus NEGATIVE NEGATIVE Final    Comment: (NOTE) The Xpert SA Assay (FDA approved for NASAL specimens in patients 66 years of age and older), is one component of a comprehensive surveillance  program. It is not intended to diagnose infection nor to guide or monitor treatment. Performed at [facility], [address] [place], [place] [address]      Labs: BNP (last 3 results) No results for input(s): BNP in the last 8760 hours. Basic Metabolic Panel: Recent Labs  Lab 09/24/21 0346 09/26/21 0415 09/27/21 0338 09/28/21 0332 09/29/21 0600 09/30/21 0229  NA 127* 130* 129* 128* 130* 128*  K 3.3* 3.2* 2.9* 3.6 4.0 3.9  CL 93* 93* 96* 94* 97* 96*  CO2 25 27 27 27 27 27   GLUCOSE 226* 305* 211* 296* 268* 323*  BUN 44* 51* 51* 42* 45* 48*  CREATININE 8.06* 7.90* 7.15* 7.00* 6.13* 5.84*  CALCIUM 7.7* 8.3* 8.0* 8.3* 8.3* 8.3*  MG  --  1.7 1.5* 1.6* 1.5* 1.6*  PHOS 7.1*  --   --   --   --   --    Liver Function Tests: Recent Labs  Lab 09/24/21 0346  ALBUMIN 1.8*   No results for input(s): LIPASE, AMYLASE in the last 168 hours. No results for input(s): AMMONIA in the last 168 hours. CBC: Recent Labs  Lab 09/26/21 0415 09/27/21 0338 09/28/21 0332 09/29/21 0600 09/30/21 0229  WBC 7.8 6.3 5.8 6.3 7.1  HGB 10.6* 9.7* 10.2* 10.3* 9.9*  HCT 31.9* 29.1* 30.5*  31.3* 30.0*  MCV 91.4 90.4 93.0 92.6 91.5  PLT 252 249 269 271 307   Cardiac Enzymes: No results for input(s): CKTOTAL, CKMB, CKMBINDEX, TROPONINI in the last 168 hours. BNP: Invalid input(s): POCBNP CBG: Recent Labs  Lab 09/29/21 0748 09/29/21 1158 09/29/21 1600 09/29/21 2144 09/30/21 0757  GLUCAP 231* 242* 245* 285* 309*   D-Dimer No results for input(s): DDIMER in the last 72 hours. Hgb A1c No results for input(s): HGBA1C in the last 72 hours. Lipid Profile No results for input(s): CHOL, HDL, LDLCALC, TRIG, CHOLHDL, LDLDIRECT in the last 72 hours. Thyroid function studies No results for input(s): TSH, T4TOTAL, T3FREE, THYROIDAB in the last 72 hours.  Invalid input(s): FREET3 Anemia work up No results for input(s): VITAMINB12, FOLATE, FERRITIN, TIBC, IRON, RETICCTPCT in the last 72 hours. Urinalysis    Component Value Date/Time   COLORURINE YELLOW 05/08/2015 1625   APPEARANCEUR Cloudy (A) 05/08/2015 1625   LABSPEC 1.020 05/08/2015 1625   PHURINE 6.5 05/08/2015 1625   GLUCOSEU NEGATIVE 05/08/2015 1625   HGBUR SMALL (A) 05/08/2015 1625   BILIRUBINUR NEGATIVE 05/08/2015 1625   BILIRUBINUR neg 12/05/2012 1529   KETONESUR NEGATIVE 05/08/2015 1625   PROTEINUR neg 12/05/2012 1529   UROBILINOGEN 0.2 05/08/2015 1625   NITRITE NEGATIVE 05/08/2015 1625   LEUKOCYTESUR NEGATIVE 05/08/2015 1625   Sepsis Labs Invalid input(s): PROCALCITONIN,  WBC,  LACTICIDVEN Microbiology Recent Results (from the past 240 hour(s))  Resp Panel by RT-PCR (Flu A&B, Covid) Nasopharyngeal Swab     Status: None   Collection Time: 09/21/21  2:25 AM   Specimen: Nasopharyngeal Swab; Nasopharyngeal(NP) swabs in vial transport medium  Result Value Ref Range Status   SARS Coronavirus 2 by RT PCR NEGATIVE NEGATIVE Final    Comment: (NOTE) SARS-CoV-2 target nucleic acids are NOT DETECTED.  The SARS-CoV-2 RNA is generally detectable in upper respiratory specimens during the acute phase of infection.  The lowest concentration of SARS-CoV-2 viral copies this assay can detect is 138 copies/mL. A negative result does not preclude SARS-Cov-2 infection and should not be used as the sole basis for treatment or other patient management decisions. A negative result may occur with  improper specimen collection/handling, submission of specimen other than nasopharyngeal swab, presence of viral mutation(s) within the areas targeted by this assay, and inadequate number of viral copies(<138 copies/mL). A negative result must be combined with clinical observations, patient history, and epidemiological information. The expected result is Negative.  Fact Sheet for Patients:  EntrepreneurPulse.com.au  Fact Sheet for Healthcare Providers:  IncredibleEmployment.be  This test is no t yet approved or cleared by the [place] FDA and  has been authorized for detection and/or diagnosis of SARS-CoV-2 by FDA under an Emergency Use Authorization (EUA). This EUA will remain  in effect (meaning this test can be used) for the duration of the COVID-19 declaration under Section 564(b)(1) of the Act, 21 U.S.C.section 360bbb-3(b)(1), unless the authorization is terminated  or revoked sooner.       Influenza A by PCR NEGATIVE NEGATIVE Final   Influenza B by PCR NEGATIVE NEGATIVE Final    Comment: (NOTE) The Xpert Xpress SARS-CoV-2/FLU/RSV plus assay is intended as an aid in the diagnosis of influenza from Nasopharyngeal swab specimens and should not be used as a sole basis for treatment. Nasal washings and aspirates are unacceptable for Xpert Xpress SARS-CoV-2/FLU/RSV testing.  Fact Sheet for Patients: EntrepreneurPulse.com.au  Fact Sheet for Healthcare Providers: IncredibleEmployment.be  This test is not yet approved or cleared by the [place] FDA and has been authorized for detection and/or diagnosis of SARS-CoV-2 by FDA  under an Emergency Use Authorization (EUA). This EUA will remain in effect (meaning this test can be used) for the duration of the COVID-19 declaration under Section 564(b)(1) of the Act, 21 U.S.C. section 360bbb-3(b)(1), unless the authorization is terminated or revoked.  Performed at [facility], [address] [place], [place] [address]   Surgical pcr screen     Status: None   Collection Time: 09/21/21 10:15 AM   Specimen: Nasal Mucosa; Nasal Swab  Result Value Ref Range Status   MRSA, PCR NEGATIVE NEGATIVE Final   Staphylococcus aureus NEGATIVE NEGATIVE Final    Comment: (NOTE) The Xpert SA Assay (FDA approved for NASAL specimens in patients 3 years of age and older), is one component of a comprehensive surveillance program. It is not intended to diagnose infection nor to guide or monitor treatment. Performed at [facility], [address], [place], [place] [address]      Time coordinating discharge: Over 30 minutes  SIGNED:   Sidney Ace, MD  [facility] 09/30/2021, 9:19 AM Pager   If 7PM-7AM, please contact night-coverage

## 2021-09-30 NOTE — Consult Note (Signed)
[facility] Renal Consultation Note    Indication for Consultation:  Management of ESRD/hemodialysis; anemia, hypertension/volume and secondary hyperparathyroidism  HPI: Andrea Stewart is a 75 y.o. female with a PMH significant for DM, HTN, obesity, COPD/tobacco use, ESRD on CCPD at [facility] who presented to [facility] on 09/21/21 after mechanical fall at home complicated by undisplaced right femoral intertrochanteric fracture and nondisplaced lateral humeral head fracture of the right shoulder.  Seen by Ortho who did not recommend surgical intervention and was transferred to [facility] for rehab.  We were consulted to provide peritoneal dialysis during her rehab stay.  Past Medical History:  Diagnosis Date   Backache, unspecified    s/p c-spine and l-spine fusions   COPD (chronic obstructive pulmonary disease) ([place])    Degeneration of intervertebral disc, site unspecified    Depressive disorder, not elsewhere classified    Disorder of bone and cartilage, unspecified    Family history of adverse reaction to anesthesia    mother did, but patient not sure of the complication   HTN (hypertension)    Hx of left heart catheterization by cutdown    a. 7 years ago; no significant cad; b. Lexiscan 2014: no significant ischemia, no significant EKG changes concerning for ischemia, EF 67%, overall low risk study   Kidney failure    Dr Cyndia Diver   Myalgia and myositis, unspecified    Obesity, unspecified    Personal history of tobacco use, presenting hazards to health    1/2 ppd   Plantar fascial fibromatosis    S/P cholecystectomy    S/p nephrectomy    Type II or unspecified type diabetes mellitus without mention of complication, not stated as uncontrolled    Unspecified essential hypertension    Unspecified hereditary and idiopathic peripheral neuropathy    secondary to diabetes   Past Surgical History:  Procedure Laterality Date   ABDOMINAL HYSTERECTOMY     APPENDECTOMY     BACK SURGERY      1966 and 2006    CARDIAC CATHETERIZATION     o.k. 2003   CARPAL TUNNEL RELEASE     CERVICAL DISCECTOMY     CHOLECYSTECTOMY     COLONOSCOPY  09/1999   colonoscopy-hemorrhoids, re-check 5 years (10/2006)   dexa     osteopenia (01/2004)   DIALYSIS/PERMA CATHETER INSERTION Right 01/05/2021   Procedure: DIALYSIS/PERMA CATHETER INSERTION;  Surgeon: Algernon Huxley, MD;  Location: [place] [facility];  Service: Cardiovascular;  Laterality: Right;   DIALYSIS/PERMA CATHETER REMOVAL N/A 02/05/2021   Procedure: DIALYSIS/PERMA CATHETER REMOVAL;  Surgeon: Algernon Huxley, MD;  Location: [place] [facility];  Service: Cardiovascular;  Laterality: N/A;   ELBOW SURGERY     ESOPHAGOGASTRODUODENOSCOPY (EGD) WITH PROPOFOL N/A 07/23/2020   Procedure: ESOPHAGOGASTRODUODENOSCOPY (EGD) WITH PROPOFOL;  Surgeon: Robert Bellow, MD;  Location: [facility] END[facility];  Service: Endoscopy;  Laterality: N/A;  TRAVEL TO O.R. - PATIENT TO HAVE SURGERY   FEMUR IM NAIL Left 01/01/2021   Procedure: INTRAMEDULLARY (IM) NAIL FEMORAL;  Surgeon: Lovell Sheehan, MD;  Location: [facility]S;  Service: Orthopedics;  Laterality: Left;   FOOT SURGERY     x 3   HIP FRACTURE SURGERY Left    JOINT REPLACEMENT     KIDNEY DONATION  1991   LAPAROSCOPIC ABDOMINAL EXPLORATION N/A 07/23/2020   Procedure: LAPAROSCOPIC ABDOMINAL EXPLORATION;  Surgeon: Robert Bellow, MD;  Location: [facility] ORS;  Service: General;  Laterality: N/A;   LUMBAR FUSION     NECK  SURGERY     06/2005   Problem with general Anesthesia     02/2006   REPLACEMENT TOTAL KNEE  10/2004   trigger finger surgery     UPPER GI ENDOSCOPY N/A 07/23/2020   Procedure: UPPER GI ENDOSCOPY;  Surgeon: Robert Bellow, MD;  Location: [facility] ORS;  Service: General;  Laterality: N/A;   VENTRAL HERNIA REPAIR N/A 09/08/2017   Primary repair of a 10 x 15 mm infraumbilical fascial defect.  Surgeon: Robert Bellow, MD;  Location: [facility] ORS;  Service: General;  Laterality: N/A;    VESICOVAGINAL FISTULA CLOSURE W/ TAH     VIDEO BRONCHOSCOPY Bilateral 04/02/2015   Procedure: VIDEO BRONCHOSCOPY WITHOUT FLUORO;  Surgeon: Juanito Doom, MD;  Location: [facility];  Service: Cardiopulmonary;  Laterality: Bilateral;   Family History:   Family History  Problem Relation Age of Onset   Hypertension Mother    Colon cancer Mother    Cancer Mother        colon   Coronary artery disease Mother    Diabetes Mother    Prostate cancer Father    COPD Father    Cancer Father        prostate   Heart attack Brother 53   Sleep apnea Brother    Diabetes Brother    Diabetes Paternal Aunt    Diabetes Paternal Uncle    Cancer Daughter        Brain, lungs, liver, spine, kidney   Social History:  reports that she has been smoking cigarettes. She has a 7.50 pack-year smoking history. She has never used smokeless tobacco. She reports that she does not drink alcohol and does not use drugs. Allergies  Allergen Reactions   Morphine Anaphylaxis   Prednisone Other (See Comments)    "makes me think I am having a heart attack"   Gabapentin Other (See Comments)    intolerance   Hydralazine Itching   Nsaids Diarrhea   Penicillins Swelling    TOLERATED CEFAZOLIN 07/2020 Has patient had a PCN reaction causing immediate rash, facial/tongue/throat swelling, SOB or lightheadedness with hypotension: Yes Has patient had a PCN reaction causing severe rash involving mucus membranes or skin necrosis: No Has patient had a PCN reaction that required hospitalization: No Has patient had a PCN reaction occurring within the last 10 years: No If all of the above answers are "NO", then may proceed with Cephalosporin use.    Prior to Admission medications   Medication Sig Start Date End Date Taking? Authorizing Provider  acetaminophen (TYLENOL) 325 MG tablet Take 2 tablets (650 mg total) by mouth every 6 (six) hours as needed for mild pain or headache (headache). 01/09/21   Ezekiel Slocumb, DO   ALPRAZolam Duanne Moron) 0.5 MG tablet Take 1 tablet (0.5 mg total) by mouth at bedtime for 2 days. CIR use only 09/30/21 10/02/21  Ralene Muskrat B, MD  aspirin EC 81 MG EC tablet Take 1 tablet (81 mg total) by mouth 2 (two) times daily. Swallow whole. 01/09/21   Ezekiel Slocumb, DO  atorvastatin (LIPITOR) 20 MG tablet TAKE 1 TABLET BY MOUTH DAILY Patient taking differently: Take 20 mg by mouth daily with supper. 08/28/19   Crecencio Mc, MD  budesonide-formoterol (SYMBICORT) 160-4.5 MCG/ACT inhaler Inhale 2 puffs into the lungs daily. 11/26/14   [provider]  Continuous Blood Gluc Receiver ([place]) [place] See admin instructions. 03/08/19   [provider]  ergocalciferol (VITAMIN D2) 1.25 MG (50000 UT) capsule Take  by mouth once a week. 04/30/21   [provider]  furosemide (LASIX) 80 MG tablet Take 1 tablet (80 mg total) by mouth daily. 08/19/21   Minna Merritts, MD  hydrOXYzine (ATARAX/VISTARIL) 25 MG tablet Take 1 tablet (25 mg total) by mouth 2 (two) times daily as needed for anxiety. 01/09/21   Nicole Kindred A, DO  insulin NPH Human (NOVOLIN N) 100 UNIT/ML injection Novolin N NPH U-100 Insulin isophane 100 unit/mL subcutaneous susp  INJECT 20 UNITS IN THE MORNING AND 35 UNITS IN THE EVENING. MAX DAILY DOSE OF 100 UNITS 12/10/20   [provider]  insulin regular human CONCENTRATED (HUMULIN R U-500 KWIKPEN) 500 UNIT/ML KwikPen Inject into the skin as needed. 05/02/20   [provider]  Magnesium 500 MG TABS 1 tablet with a meal    [provider]  metoprolol succinate (TOPROL-XL) 25 MG 24 hr tablet Take 1 tablet (25 mg total) by mouth daily. Take with or immediately following a meal. 08/19/21   Gollan, Kathlene November, MD  Multiple Vitamins-Minerals (KP WOMENS 50+ DAILY FORMULA) TABS Take by mouth daily. 05/02/20   [provider]  nitroGLYCERIN (NITROSTAT) 0.4 MG SL tablet Place 1 tablet (0.4 mg total) under the tongue every 5  (five) minutes x 3 doses as needed for chest pain. Maximum 3 tablets 08/19/21   Minna Merritts, MD  Nutritional Supplements (,FEEDING SUPPLEMENT, PROSOURCE PLUS) liquid Take 30 mLs by mouth daily. 01/09/21   Ezekiel Slocumb, DO  Nutritional Supplements (FEEDING SUPPLEMENT, NEPRO CARB STEADY,) LIQD Take 237 mLs by mouth daily. 01/09/21   Nicole Kindred A, DO  ondansetron (ZOFRAN) 4 MG tablet Take 1 tablet (4 mg total) by mouth every 6 (six) hours as needed for nausea. 01/09/21   Ezekiel Slocumb, DO  oxyCODONE (ROXICODONE) 5 MG immediate release tablet Take 1-2 tablets (5-10 mg total) by mouth every 6 (six) hours as needed for up to 3 days for moderate pain or severe pain. CIR use only 09/30/21 10/03/21  Ralene Muskrat B, MD  pantoprazole (PROTONIX) 40 MG tablet Take 40 mg by mouth 2 (two) times daily. 12/10/20   [provider]  polyethylene glycol (MIRALAX / GLYCOLAX) 17 g packet Take 17 g by mouth daily. 01/09/21   Ezekiel Slocumb, DO  Sennosides (SENNA) 8.6 MG CAPS Take 1 capsule by mouth at bedtime as needed. 09/12/20   [provider]  Spacer/Aero-Holding Chambers (AEROCHAMBER MV) inhaler Use as instructed 01/20/15   Juanito Doom, MD  spironolactone (ALDACTONE) 25 MG tablet Take 25 mg by mouth daily. 04/30/21   [provider]  valsartan-hydrochlorothiazide (DIOVAN-HCT) 320-25 MG per tablet TAKE ONE (1) TABLET EACH DAY 04/15/15 10/29/15  Minna Merritts, MD   Current Facility-Administered Medications  Medication Dose Route Frequency Provider Last Rate Last Admin   (feeding supplement) PROSource Plus liquid 30 mL  30 mL Oral Daily Angiulli, Lavon Paganini, PA-C       acetaminophen (TYLENOL) tablet 650 mg  650 mg Oral Q6H PRN Angiulli, Lavon Paganini, PA-C       albuterol (PROVENTIL) (2.5 MG/3ML) 0.083% nebulizer solution 3 mL  3 mL Inhalation Q4H PRN Angiulli, Lavon Paganini, PA-C       [START ON 10/01/2021] ALPRAZolam Duanne Moron) tablet 0.5 mg  0.5 mg Oral QHS Angiulli, Lavon Paganini,  PA-C       aspirin EC tablet 81 mg  81 mg Oral BID Angiulli, Daniel J, PA-C       atorvastatin (LIPITOR)  tablet 20 mg  20 mg Oral Q supper Angiulli, Lavon Paganini, PA-C       dialysis solution 1.5% low-MG/low-CA dianeal solution   Intraperitoneal 5 X Daily Donato Heinz, MD       dialysis solution 2.5% low-MG/low-CA dianeal solution   Intraperitoneal 5 X Daily Donato Heinz, MD       Derrill Memo ON 10/01/2021] feeding supplement (NEPRO CARB STEADY) liquid 237 mL  237 mL Oral Q24H Angiulli, Lavon Paganini, PA-C       furosemide (LASIX) tablet 80 mg  80 mg Oral Daily Angiulli, Lavon Paganini, PA-C       gentamicin cream (GARAMYCIN) 0.1 % 1 application  1 application Topical Daily Donato Heinz, MD       heparin 2,500 Units in dialysis solution 1.5% low-MG/low-CA 5,000 mL dialysis solution   Peritoneal Dialysis PRN Raulkar, Clide Deutscher, MD       heparin 2,500 Units in dialysis solution 2.5% low-MG/low-CA 5,000 mL dialysis solution   Peritoneal Dialysis PRN Raulkar, Clide Deutscher, MD       hydrOXYzine (ATARAX/VISTARIL) tablet 25 mg  25 mg Oral BID PRN Angiulli, Lavon Paganini, PA-C       insulin aspart (novoLOG) injection 0-9 Units  0-9 Units Subcutaneous TID WC Angiulli, Lavon Paganini, PA-C       insulin aspart (novoLOG) injection 4 Units  4 Units Subcutaneous TID WC Angiulli, Lavon Paganini, PA-C       insulin detemir (LEVEMIR) injection 12 Units  12 Units Subcutaneous BID Angiulli, Lavon Paganini, PA-C       magnesium oxide (MAG-OX) tablet 800 mg  800 mg Oral BID Angiulli, Lavon Paganini, PA-C       [START ON 10/01/2021] metoprolol succinate (TOPROL-XL) 24 hr tablet 37.5 mg  37.5 mg Oral Q breakfast Raulkar, Clide Deutscher, MD       mometasone-formoterol (DULERA) 200-5 MCG/ACT inhaler 2 puff  2 puff Inhalation BID Angiulli, Lavon Paganini, PA-C       multivitamin liquid 15 mL  15 mL Oral Daily Angiulli, Lavon Paganini, PA-C       nitroGLYCERIN (NITROSTAT) SL tablet 0.4 mg  0.4 mg Sublingual Q5 Min x 3 PRN Angiulli, Lavon Paganini, PA-C       ondansetron [facility])  tablet 4 mg  4 mg Oral Q6H PRN Angiulli, Lavon Paganini, PA-C       Or   ondansetron (ZOFRAN) injection 4 mg  4 mg Intravenous Q6H PRN Angiulli, Lavon Paganini, PA-C       oxyCODONE (Oxy IR/ROXICODONE) immediate release tablet 2.5 mg  2.5 mg Oral Q4H PRN Angiulli, Lavon Paganini, PA-C       pantoprazole (PROTONIX) EC tablet 40 mg  40 mg Oral BID Angiulli, Daniel J, PA-C       polyethylene glycol (MIRALAX / GLYCOLAX) packet 17 g  17 g Oral Daily PRN Angiulli, Lavon Paganini, PA-C       potassium chloride (KLOR-CON) packet 40 mEq  40 mEq Oral Daily Angiulli, Lavon Paganini, PA-C       senna (SENOKOT) tablet 8.6 mg  1 tablet Oral QHS PRN Angiulli, Lavon Paganini, PA-C       spironolactone (ALDACTONE) tablet 12.5 mg  12.5 mg Oral Daily Raulkar, Clide Deutscher, MD       traZODone (DESYREL) tablet 25 mg  25 mg Oral QHS PRN Cathlyn Parsons, PA-C       [START ON 10/05/2021] Vitamin D (Ergocalciferol) (DRISDOL) capsule 50,000 Units  50,000 Units Oral Weekly Angiulli, Lavon Paganini, PA-C  Labs: Basic Metabolic Panel: Recent Labs  Lab 09/24/21 0346 09/26/21 0415 09/28/21 0332 09/29/21 0600 09/30/21 0229  NA 127*   < > 128* 130* 128*  K 3.3*   < > 3.6 4.0 3.9  CL 93*   < > 94* 97* 96*  CO2 25   < > 27 27 27   GLUCOSE 226*   < > 296* 268* 323*  BUN 44*   < > 42* 45* 48*  CREATININE 8.06*   < > 7.00* 6.13* 5.84*  CALCIUM 7.7*   < > 8.3* 8.3* 8.3*  PHOS 7.1*  --   --   --   --    < > = values in this interval not displayed.   Liver Function Tests: Recent Labs  Lab 09/24/21 0346  ALBUMIN 1.8*   No results for input(s): LIPASE, AMYLASE in the last 168 hours. No results for input(s): AMMONIA in the last 168 hours. CBC: Recent Labs  Lab 09/26/21 0415 09/27/21 0338 09/28/21 0332 09/29/21 0600 09/30/21 0229  WBC 7.8 6.3 5.8 6.3 7.1  HGB 10.6* 9.7* 10.2* 10.3* 9.9*  HCT 31.9* 29.1* 30.5* 31.3* 30.0*  MCV 91.4 90.4 93.0 92.6 91.5  PLT 252 249 269 271 307   Cardiac Enzymes: No results for input(s): CKTOTAL, CKMB, CKMBINDEX,  TROPONINI in the last 168 hours. CBG: Recent Labs  Lab 09/29/21 1158 09/29/21 1600 09/29/21 2144 09/30/21 0757 09/30/21 1119  GLUCAP 242* 245* 285* 309* 243*   Iron Studies: No results for input(s): IRON, TIBC, TRANSFERRIN, FERRITIN in the last 72 hours. Studies/Results: No results found.  ROS: Pertinent items are noted in HPI. Physical Exam: Vitals:   09/30/21 1345  BP: 103/67  Pulse: (!) 106  Resp: 17  Temp: 97.8 F (36.6 C)  TempSrc: Oral  SpO2: 100%      Weight change:  No intake or output data in the 24 hours ending 09/30/21 1447 BP 103/67 (BP Location: Left Arm)   Pulse (!) 106   Temp 97.8 F (36.6 C) (Oral)   Resp 17   SpO2 100%  General appearance: alert, cooperative, no distress, and moderately obese Head: Normocephalic, without obvious abnormality, atraumatic Resp: clear to auscultation bilaterally Cardio: regular rate and rhythm, S1, S2 normal, no murmur, click, rub or gallop GI: soft, non-tender; bowel sounds normal; no masses,  no organomegaly and PD catheter in RLQ, exit site is clean and gentamycin cream in place, no erythema or discharge. Extremities: edema 1+ pretibial edema bilaterally Dialysis Access:  Dialysis Orders: Center: [facility] therapies  on CCPD . EDW 73 kg, 5 exchanges, 2 liters fill, fill time 10 min, dwell time 1.5 hrs, drain time 20 minutes.   Assessment/Plan:  S/p right hip and shoulder fracture - no surgery deemed necessary and now in rehab.  ESRD -  will continue with CCPD at her outpatient prescription.  Hypertension/volume  - weight is up.  Will use 1/2 1.5% and 2.5% but may need to use all 2.5% if she doesn't UF enough.  Anemia  - Hgb trending down.  Will start Aranesp and follow h/h and iron stores.  Metabolic bone disease -  continue with home meds  Nutrition -  renal diet, carb modified.  Hyponatremia - likely due to volume overload.  UF with CCPD as above.  Dm type 2 - poorly controlled with Hgb A1c 9.5%, per primary  service.   Donetta Potts, MD [facility] Pager [phone] 09/30/2021, 2:47 PM

## 2021-09-30 NOTE — Discharge Instructions (Addendum)
Inpatient Rehab Discharge Instructions  ODETTA FORNESS Discharge date and time: 10/16/21   Activities/Precautions/ Functional Status: Activity: activity as tolerated Diet: diabetic diet Wound Care: Routine skin checks   Functional status:  ___ No restrictions     ___ Walk up steps independently _X__ 24/7 supervision/assistance   ___ Walk up steps with assistance ___ Intermittent supervision/assistance  ___ Bathe/dress independently ___ Walk with walker     _x__ Bathe/dress with assistance ___ Walk Independently    ___ Shower independently ___ Walk with assistance    ___ Shower with assistance _X__ No alcohol     ___ Return to work/school ________   COMMUNITY REFERRALS UPON DISCHARGE:    Home Health:   PT     OT     ST                   Agency: [facility]  Phone: [phone]    Medical Equipment/Items Ordered: Patient Has All DME                                                 Agency/Supplier:N/A   Special Instructions: No driving smoking or alcohol 2. Monitor blood sugars before meals and at bedtime. Follow up with endocrine for adjustment of your insulin 3. Increase fluid intake.     Follow-up [facility] for ongoing peritoneal dialysis   My questions have been answered and I understand these instructions. I will adhere to these goals and the provided educational materials after my discharge from the hospital.  Patient/Caregiver Signature _______________________________ Date __________  Clinician Signature _______________________________________ Date __________  Please bring this form and your medication list with you to all your follow-up doctor's appointments.

## 2021-09-30 NOTE — Progress Notes (Signed)
[facility]  PROGRESS NOTE   Subjective:   Peritoneal dialysis last night. Tolerated treatment well.   Patient is upset about her care overnight. Patient states her pain was no addressed.     Objective:  Vital signs in last 24 hours:  Temp:  [97.6 F (36.4 C)-98.6 F (37 C)] 97.6 F (36.4 C) (11/16 0753) Pulse Rate:  [90-106] 98 (11/16 0753) Resp:  [16-20] 16 (11/16 0753) BP: (107-142)/(63-77) 142/63 (11/16 0753) SpO2:  [93 %-99 %] 97 % (11/16 0753)  Weight change:  Filed Weights   09/27/21 0400 09/28/21 0500 09/29/21 0500  Weight: 81.2 kg 85.4 kg 88.2 kg    Intake/Output: No intake/output data recorded.   Intake/Output this shift:  Total I/O In: 120 [P.O.:120] Out: -   Physical Exam: General:  No acute distress, sitting up in chair  Head:  Normocephalic, atraumatic. Moist oral mucosal membranes  Eyes:  Anicteric  Neck:  Supple  Lungs:   Clear to auscultation, normal effort  Heart:  regular  Abdomen:   Soft, nontender, bowel sounds present  Extremities:  No peripheral edema. Right upper extremity in sling  Neurologic:  Awake, alert, following commands  Skin:  No lesions  Access: PD catheter    Basic Metabolic Panel: Recent Labs  Lab 09/24/21 0346 09/26/21 0415 09/27/21 0338 09/28/21 0332 09/29/21 0600 09/30/21 0229  NA 127* 130* 129* 128* 130* 128*  K 3.3* 3.2* 2.9* 3.6 4.0 3.9  CL 93* 93* 96* 94* 97* 96*  CO2 25 27 27 27 27 27   GLUCOSE 226* 305* 211* 296* 268* 323*  BUN 44* 51* 51* 42* 45* 48*  CREATININE 8.06* 7.90* 7.15* 7.00* 6.13* 5.84*  CALCIUM 7.7* 8.3* 8.0* 8.3* 8.3* 8.3*  MG  --  1.7 1.5* 1.6* 1.5* 1.6*  PHOS 7.1*  --   --   --   --   --      CBC: Recent Labs  Lab 09/26/21 0415 09/27/21 0338 09/28/21 0332 09/29/21 0600 09/30/21 0229  WBC 7.8 6.3 5.8 6.3 7.1  HGB 10.6* 9.7* 10.2* 10.3* 9.9*  HCT 31.9* 29.1* 30.5* 31.3* 30.0*  MCV 91.4 90.4 93.0 92.6 91.5  PLT 252 249 269 271 307      Urinalysis: No results  for input(s): COLORURINE, LABSPEC, PHURINE, GLUCOSEU, HGBUR, BILIRUBINUR, KETONESUR, PROTEINUR, UROBILINOGEN, NITRITE, LEUKOCYTESUR in the last 72 hours.  Invalid input(s): APPERANCEUR    Imaging: No results found.   Medications:    dialysis solution 1.5% low-MG/low-CA     promethazine (PHENERGAN) injection (IM or IVPB)      (feeding supplement) PROSource Plus  30 mL Oral Daily   ALPRAZolam  0.5 mg Oral QHS   atorvastatin  20 mg Oral Q supper   feeding supplement (NEPRO CARB STEADY)  237 mL Oral Q24H   furosemide  80 mg Oral Daily   gentamicin cream  1 application Topical Daily   insulin aspart  0-9 Units Subcutaneous TID WC   insulin aspart  4 Units Subcutaneous TID WC   insulin detemir  12 Units Subcutaneous BID   magnesium oxide  800 mg Oral BID   metoprolol succinate  25 mg Oral Q breakfast   mometasone-formoterol  2 puff Inhalation BID   multivitamin   Oral Daily   pantoprazole  40 mg Oral BID   potassium chloride  40 mEq Oral Daily   spironolactone  25 mg Oral Daily   Vitamin D (Ergocalciferol)  50,000 Units Oral Weekly    Assessment/ Plan:  Active Problems:   Closed right hip fracture [facility])  Andrea Stewart is a 75 y.o. white female with end stage renal disease on peritoneal dialysis, hypertension, COPD, diabetes mellitus type II, who is admitted to [facility] on 09/21/2021 for Ventral hernia without obstruction or gangrene [K43.9] Closed right hip fracture ([place]) [S72.001A] Fall, initial encounter [W19.XXXA] Closed intertrochanteric fracture of hip, right, initial encounter ([place]) [S72.141A] Humeral head fracture, right, closed, initial encounter [S42.291A]  [facility] 73kg CCPD 5 cycles, 2 liter fills 9.5 hours.    #1: End stage renal disease on peritoneal dialysis. With complication of dialysis device. PD catheter working well status post TPA.  - Continue peritoneal dialysis   #2: Anemia of chronic kidney disease: hemoglobin 9.9, normocytic.   - May need to restart ESA.   #3. 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 Remains on furosemide.  - Continue potassium chloride and spironolactone.    #4: Hypertension: 142/63. Current regimen of metoprolol, furosemide, and spironolactone.    #5: Diabetes mellitus type II: Hemoglobin A1c of 9.7%.  - Continue glucose control    LOS: 9 Lavonia Dana, MD [facility] 11/16/202211:04 AM

## 2021-09-30 NOTE — Progress Notes (Signed)
Orthopedic Tech Progress Note Patient Details:  Andrea Stewart 12/04/1945 [id number]  Patient has on ARM SLING/IMMOBILIZER   Patient ID: Andrea Stewart, female   DOB: Mar 05, 1946, 75 y.o.   MRN: [id number]  Andrea Stewart 09/30/2021, 3:48 PM

## 2021-09-30 NOTE — Progress Notes (Signed)
Izora Ribas, MD   Physician  Physical Medicine and Rehabilitation  PMR Pre-admission     Signed  Date of Service:  09/25/2021  3:44 PM       Related encounter: ED to Hosp-Admission (Discharged) from 09/21/2021 in [place] ([facility])       Signed      Show:Clear all $RemoveBefore'[x]'AygGdqviBiKsx$ Written$R'[x]'OV$ Templat'[]'$ Copied  Added by: $RemoveB'[x]'YySLkOmT$ Cristina Gong, RN$RemoveBeforeDE'[x]'WfajYOnelcGyAhq$ Ranell Patrick Clide Deutscher, MD  $R'[]'tI$ [place] for details                                                                                                                                                                                                                                                                                                                                                                                                                                                             PMR Admission Coordinator Pre-Admission Assessment   Patient: Andrea Stewart is an 75 y.o., female MRN: [id number] DOB: [birth year] Height: $RemoveBeforeDE'4\' 11"'ctyIaWDzycUIHmU$  (149.9 cm) Weight: 88.2 kg   Insurance Information HMO:     PPO:      PCP:      IPA:      80/20:      OTHER:  PRIMARY: [employer]      Policy#: [id number]  Subscriber: pt CM Name: Junious Dresser      Phone#: [phone]     Fax#: [phone] Pre-Cert#: [id number] for 7 days      Employer:  Benefits:  Phone #: [phone]     Name: 11/10 Eff. Date: 11/15/2018     Deduct: none      Out of Pocket Max: $3450      Life Max: none CIR: $325 co pay per day days 1 until 6      SNF: no c copay days 1 until 20; $184 copay y per day days 21 until 100 Outpatient: $15 per visit     Co-Pay: visits per medical neccesity Home Health: 80%      Co-Pay: visits per medical neccesity DME: 80%     Co-Pay: 205 Providers: in network  SECONDARY: none         Financial Counselor:       Phone#:    The Data processing manager" for patients in Inpatient Rehabilitation Facilities with attached "Privacy Act Statement-Health Care Records" was provided and verbally reviewed with: Patient and Family   Emergency Contact Information Contact Information       Name Relation Home Work Mobile    [place] Son     [phone]    Matilda, Fleig [phone]           Current Medical History  Patient Admitting Diagnosis: fall with polytrauma   History of Present Illness: 75 year old female with medical history of COPD, depression, HTN, type 2 DM and ESRD on peritoneal dialysis. Presented to [facility] on 11/7 with mechanical fall while using her BSC, stumbled and fell with subsequent pain to should her hip. Imaging showed a nondisplaced fracture of the lateral humeral head and an undisplaced right femoral intra trochanteric fracture. Ortho consulted and recommended non operative management. RUE sling and oral pain regimen. HTN to continue on Lasix and spironolactone and metoprolol. Had an episode of hypoglycemia with mediation adjustment. Nephrology consulted to manage peritoneal dialysis. On 11/11 noted a significant amount of fibrin within the drain bag. Last fill administered heparin. To monitor.    Patient's medical record from [facility] has been reviewed by the rehabilitation admission coordinator and physician.   Past Medical History      Past Medical History:  Diagnosis Date   Backache, unspecified      s/p c-spine and l-spine fusions   COPD (chronic obstructive pulmonary disease) (HCC)     Degeneration of intervertebral disc, site unspecified     Depressive disorder, not elsewhere classified     Disorder of bone and cartilage, unspecified     Family history of adverse reaction to anesthesia      mother did, but patient not sure of the complication   HTN (hypertension)     Hx of left heart catheterization by cutdown       a. 7 years ago; no significant cad; b. Lexiscan 2014: no significant ischemia, no significant EKG changes concerning for ischemia, EF 67%, overall low risk study   Kidney failure      Dr Caryl Bis   Myalgia and myositis, unspecified     Obesity, unspecified     Personal history of tobacco use, presenting hazards to health      1/2 ppd   Plantar fascial fibromatosis     S/P cholecystectomy     S/p nephrectomy     Type II or unspecified type diabetes mellitus without mention of complication, not stated as uncontrolled  Unspecified essential hypertension     Unspecified hereditary and idiopathic peripheral neuropathy      secondary to diabetes    Has the patient had major surgery during 100 days prior to admission? No   Family History   family history includes COPD in her father; Cancer in her daughter, father, and mother; Colon cancer in her mother; Coronary artery disease in her mother; Diabetes in her brother, mother, paternal aunt, and paternal uncle; Heart attack (age of onset: 65) in her brother; Hypertension in her mother; Prostate cancer in her father; Sleep apnea in her brother.   Current Medications   Current Facility-Administered Medications:    (feeding supplement) PROSource Plus liquid 30 mL, 30 mL, Oral, Daily, Mansy, Jan A, MD, 30 mL at 09/30/21 0934   [EXPIRED] acetaminophen (TYLENOL) tablet 1,000 mg, 1,000 mg, Oral, Q8H, 1,000 mg at 09/23/21 1400 **FOLLOWED BY** acetaminophen (TYLENOL) tablet 650 mg, 650 mg, Oral, Q6H PRN, Elodia Florence., MD, 650 mg at 09/26/21 0653   albuterol (PROVENTIL) (2.5 MG/3ML) 0.083% nebulizer solution 3 mL, 3 mL, Inhalation, Q4H PRN, Mansy, Jan A, MD   ALPRAZolam Duanne Moron) tablet 0.5 mg, 0.5 mg, Oral, QHS, Mansy, Jan A, MD, 0.5 mg at 09/29/21 2137   atorvastatin (LIPITOR) tablet 20 mg, 20 mg, Oral, Q supper, Mansy, Jan A, MD, 20 mg at 09/29/21 1615   dialysis solution 1.5% low-MG/low-CA dianeal solution, , Intraperitoneal, Q24H, Breeze,  Shantelle, NP   feeding supplement (NEPRO CARB STEADY) liquid 237 mL, 237 mL, Oral, Q24H, Mansy, Jan A, MD, 237 mL at 09/30/21 4332   furosemide (LASIX) tablet 80 mg, 80 mg, Oral, Daily, Mansy, Jan A, MD, 80 mg at 09/30/21 0935   gentamicin cream (GARAMYCIN) 0.1 % 1 application, 1 application, Topical, Daily, Breeze, Shantelle, NP, 1 application at 95/18/84 0935   heparin 1000 unit/ml injection 500 Units, 500 Units, Intraperitoneal, PRN, Colon Flattery, NP   hydrOXYzine (ATARAX/VISTARIL) tablet 25 mg, 25 mg, Oral, BID PRN, Mansy, Jan A, MD, 25 mg at 09/29/21 0258   insulin aspart (novoLOG) injection 0-9 Units, 0-9 Units, Subcutaneous, TID WC, Enzo Bi, MD, 7 Units at 09/30/21 0805   insulin aspart (novoLOG) injection 4 Units, 4 Units, Subcutaneous, TID WC, Enzo Bi, MD, 4 Units at 09/30/21 0805   insulin detemir (LEVEMIR) injection 12 Units, 12 Units, Subcutaneous, BID, Enzo Bi, MD, 12 Units at 09/30/21 0935   magnesium hydroxide (MILK OF MAGNESIA) suspension 30 mL, 30 mL, Oral, Daily PRN, Mansy, Jan A, MD, 30 mL at 09/23/21 0800   magnesium oxide (MAG-OX) tablet 800 mg, 800 mg, Oral, BID, Enzo Bi, MD, 800 mg at 09/30/21 0934   metoprolol succinate (TOPROL-XL) 24 hr tablet 25 mg, 25 mg, Oral, Q breakfast, Mansy, Jan A, MD, 25 mg at 09/30/21 0754   mometasone-formoterol (DULERA) 200-5 MCG/ACT inhaler 2 puff, 2 puff, Inhalation, BID, Mansy, Jan A, MD, 2 puff at 09/30/21 0935   multivitamin liquid, , Oral, Daily, Mansy, Jan A, MD, 15 mL at 09/28/21 1012   nitroGLYCERIN (NITROSTAT) SL tablet 0.4 mg, 0.4 mg, Sublingual, Q5 Min x 3 PRN, Mansy, Jan A, MD   ondansetron [facility]) tablet 4 mg, 4 mg, Oral, Q6H PRN, 4 mg at 09/24/21 0927 **OR** ondansetron (ZOFRAN) injection 4 mg, 4 mg, Intravenous, Q6H PRN, Mansy, Jan A, MD, 4 mg at 09/21/21 1126   ondansetron (ZOFRAN) tablet 4 mg, 4 mg, Oral, Q6H PRN, Mansy, Jan A, MD   oxyCODONE (Oxy IR/ROXICODONE) immediate release tablet 2.5 mg, 2.5  mg, Oral, Q4H  PRN, Elodia Florence., MD, 2.5 mg at 09/30/21 5409   pantoprazole (PROTONIX) EC tablet 40 mg, 40 mg, Oral, BID, Mansy, Jan A, MD, 40 mg at 09/30/21 0936   polyethylene glycol (MIRALAX / GLYCOLAX) packet 17 g, 17 g, Oral, Daily PRN, Mansy, Jan A, MD   potassium chloride (KLOR-CON) packet 40 mEq, 40 mEq, Oral, Daily, Lanora Manis, Madhu, MD, 40 mEq at 09/30/21 0936   promethazine (PHENERGAN) tablet 12.5 mg, 12.5 mg, Oral, Q6H PRN **OR** promethazine (PHENERGAN) 6.25 mg in sodium chloride 0.9 % 50 mL IVPB, 6.25 mg, Intravenous, Q6H PRN **OR** promethazine (PHENERGAN) suppository 12.5 mg, 12.5 mg, Rectal, Q6H PRN, Elodia Florence., MD   senna [facility]) tablet 8.6 mg, 1 tablet, Oral, QHS PRN, Mansy, Jan A, MD   spironolactone (ALDACTONE) tablet 25 mg, 25 mg, Oral, Daily, Mansy, Jan A, MD, 25 mg at 09/30/21 8119   traZODone (DESYREL) tablet 25 mg, 25 mg, Oral, QHS PRN, Mansy, Jan A, MD, 25 mg at 09/29/21 2155   Vitamin D (Ergocalciferol) (DRISDOL) capsule 50,000 Units, 50,000 Units, Oral, Weekly, Mansy, Arvella Merles, MD, 50,000 Units at 09/28/21 1727   Patients Current Diet:  Diet Order                  Diet - low sodium heart healthy             Diet regular Room service appropriate? Yes; Fluid consistency: Thin  Diet effective now                       Precautions / Restrictions Precautions Precautions: Fall Precaution Comments: WBAT on R LE and R UE, R UE remained in sling for duration of therapy session and opted to keep it on after. Restrictions Weight Bearing Restrictions: Yes RUE Weight Bearing: Weight bearing as tolerated LUE Weight Bearing: Weight bearing as tolerated RLE Weight Bearing: Weight bearing as tolerated LLE Weight Bearing: Weight bearing as tolerated Other Position/Activity Restrictions: RUE sling for comfort    Has the patient had 2 or more falls or a fall with injury in the past year? Yes Last fell 12/2020   Prior Activity Level Limited Community (1-2x/wk):  Mod I with RW. Her spouse was her caregiver managing mostly her CCPD, med management and cooking. Family felt she could do more of her care if encouraged   Prior Functional Level Self Care: Did the patient need help bathing, dressing, using the toilet or eating? Needed some help   Indoor Mobility: Did the patient need assistance with walking from room to room (with or without device)? Independent   Stairs: Did the patient need assistance with internal or external stairs (with or without device)? Independent   Functional Cognition: Did the patient need help planning regular tasks such as shopping or remembering to take medications? Independent   Patient Information Are you of Hispanic, Latino/a,or Spanish origin?: A. No, not of Hispanic, Latino/a, or Spanish origin What is your race?: A. White Do you need or want an interpreter to communicate with a doctor or health care staff?: 0. No   Patient's Response To:  Health Literacy and Transportation Is the patient able to respond to health literacy and transportation needs?: Yes Health Literacy - How often do you need to have someone help you when you read instructions, pamphlets, or other written material from your doctor or pharmacy?: Never In the past 12 months, has lack of transportation kept you from medical  appointments or from getting medications?: No In the past 12 months, has lack of transportation kept you from meetings, work, or from getting things needed for daily living?: No   Development worker, international aid / [place] Devices/Equipment: Sling (specify type), Oxygen, CBG Meter, Eyeglasses, Walker (specify type) Home Equipment: Rollator (4 wheels), BSC/3in1, Wheelchair - manual   Prior Device Use: Indicate devices/aids used by the patient prior to current illness, exacerbation or injury? Walker   Current Functional Level Cognition   Overall Cognitive Status: Within Functional Limits for tasks assessed Orientation Level:  Oriented X4 General Comments: pt is A and O x 4. Frustrated with current situation and upset with nephrology about recommendation of HD versus PD    Extremity Assessment (includes Sensation/Coordination)   Upper Extremity Assessment: RUE deficits/detail RUE Deficits / Details: 3+/5 grip strength RUE: Unable to fully assess due to pain  Lower Extremity Assessment: Generalized weakness RLE Deficits / Details: Pt had 3+/5 R knee flex/ ext and 3/5 R hip flexion strength RLE Sensation: WNL     ADLs   Overall ADL's : Needs assistance/impaired General ADL Comments: MAX A don sling seated EOB. MIN A + HW for ADL t/f.     Mobility   Overal bed mobility: Needs Assistance Bed Mobility: Supine to Sit Supine to sit: Mod assist, HOB elevated Sit to supine: Max assist General bed mobility comments: MaxA to transition sup to sit on Left side of bed     Transfers   Overall transfer level: Needs assistance Equipment used: Hemi-walker Transfers: Sit to/from Stand Sit to Stand: Min assist, Min guard Bed to/from chair/wheelchair/BSC transfer type:: Stand pivot Stand pivot transfers: Min assist Step pivot transfers: Mod assist General transfer comment:  (Good demonstration of safety awareness)     Ambulation / Gait / Stairs / Wheelchair Mobility   Ambulation/Gait Ambulation/Gait assistance: Min Web designer (Feet): 15 Feet Assistive device: Hemi-walker Gait Pattern/deviations: Step-to pattern, Decreased step length - right, Decreased weight shift to right, Antalgic, Trunk flexed General Gait Details: deferred due to weakness and fatigue today.  poor ability to step in place. Gait velocity: decreased     Posture / Balance Balance Overall balance assessment: Needs assistance Sitting-balance support: No upper extremity supported, Feet supported Sitting balance-Leahy Scale: Good Standing balance support: Single extremity supported, Reliant on assistive device for balance Standing  balance-Leahy Scale: Fair Standing balance comment:  (Pt able to maintain static stance for hygiene after use of BSC)     Special needs/care consideration Dialysis: Peritoneal; CCPD nightly. Her spouse was her trained partner who did most of the set up and management. Patient. Sons and their wives could be directed by patient to set up as spouse also a patient on CIR at same time  Hgb A1c 9.7    Previous Home Environment  Living Arrangements: Spouse/significant other  Lives With: Spouse Available Help at Discharge: Family, Available 24 hours/day (children and their families to provide 24/7 assist of the couple after CIR) Type of Home: House Home Layout: One level Home Access: Ramped entrance Bathroom Shower/Tub: Multimedia programmer: Handicapped height Bathroom Accessibility: Yes How Accessible: Accessible via [place]: No Additional Comments: Patietn on home peritoneal dialysis. Her spouse is her trained partner and does most of the setup but he is also a CIR patient at the same time as she   Discharge Living Setting Plans for Discharge Living Setting: Patient's home, Lives with (comment) (spouse) Type of Home at Discharge: [facility] Discharge Home  Layout: One level Discharge Home Access: Ramped entrance Discharge Bathroom Shower/Tub: Walk-in shower Discharge Bathroom Toilet: Handicapped height Discharge Bathroom Accessibility: Yes How Accessible: Accessible via walker Does the patient have any problems obtaining your medications?: No   Social/Family/Support Systems Patient Roles: Spouse, Parent Contact Information: son, Hilliard Clark and spouse, Carloyn Manner main contacts Anticipated Caregiver: sons and their families to provide 24/7 assist of the couple after CIR admit Anticipated Caregiver's Contact Information: see contacts Ability/Limitations of Caregiver: spouse is typically her caregiver but he is also a CIR patient at the same time Caregiver Availability:  24/7 Discharge Plan Discussed with Primary Caregiver: Yes Is Caregiver In Agreement with Plan?: Yes Does Caregiver/Family have Issues with Lodging/Transportation while Pt is in Rehab?: No   Goals Patient/Family Goal for Rehab: Mod I to supervision with PT and OT Expected length of stay: ELOS 7 to 10 days Pt/Family Agrees to Admission and willing to participate: Yes Program Orientation Provided & Reviewed with Pt/Caregiver Including Roles  & Responsibilities: Yes   Decrease burden of Care through IP rehab admission: n/a   Possible need for SNF placement upon discharge: not anticipated; if ever SNF, she would have to be transition to Hemodialysis   Patient Condition: I have reviewed medical records from [facility], spoken with CM, and patient, spouse, and son. I met with patient at the bedside for inpatient rehabilitation assessment.  Patient will benefit from ongoing PT and OT, can actively participate in 3 hours of therapy a day 5 days of the week, and can make measurable gains during the admission.  Patient will also benefit from the coordinated team approach during an Inpatient Acute Rehabilitation admission.  The patient will receive intensive therapy as well as Rehabilitation physician, nursing, social worker, and care management interventions.  Due to bladder management, bowel management, safety, skin/wound care, disease management, medication administration, pain management, and patient education the patient requires 24 hour a day rehabilitation nursing.  The patient is currently min to mod assist overall with mobility and basic ADLs.  Discharge setting and therapy post discharge at home with home health is anticipated.  Patient has agreed to participate in the Acute Inpatient Rehabilitation Program and will admit today.   Preadmission Screen Completed By:  Cleatrice Burke, 09/30/2021 10:01 AM ______________________________________________________________________   Discussed status with  Dr. Ranell Patrick on 09/30/2021 at 1001 and received approval for admission today.   Admission Coordinator:  Cleatrice Burke, RN, time  1001 Date  09/30/2021    Assessment/Plan: Diagnosis: Closed right hip fracture Does the need for close, 24 hr/day Medical supervision in concert with the patient's rehab needs make it unreasonable for this patient to be served in a less intensive setting? Yes Co-Morbidities requiring supervision/potential complications: type 2 diabetes mellitus with constipation, depression, peripheral neuropathy, morbid obesity BMI 39.27, coronary atherosclerosis Due to bladder management, bowel management, safety, skin/wound care, disease management, medication administration, pain management, and patient education, does the patient require 24 hr/day rehab nursing? Yes Does the patient require coordinated care of a physician, rehab nurse, PT, OT to address physical and functional deficits in the context of the above medical diagnosis(es)? Yes Addressing deficits in the following areas: balance, endurance, locomotion, strength, transferring, bowel/bladder control, bathing, dressing, feeding, grooming, toileting, and psychosocial support Can the patient actively participate in an intensive therapy program of at least 3 hrs of therapy 5 days a week? Yes The potential for patient to make measurable gains while on inpatient rehab is excellent Anticipated functional outcomes upon discharge from inpatient rehab:  modified independent PT, modified independent OT, independent SLP Estimated rehab length of stay to reach the above functional goals is: 7-10 days Anticipated discharge destination: Home 10. Overall Rehab/Functional Prognosis: excellent     MD Signature: Leeroy Cha, MD           Revision History                               Note Details  Author Izora Ribas, MD File Time 09/30/2021 10:22 AM  Author Type Physician Status Signed  Last Editor  Izora Ribas, MD Service Physical Medicine and [place] # [id number] Admit Date 09/30/2021

## 2021-09-30 NOTE — TOC Progression Note (Signed)
Transition of Care [facility]) - Progression Note    Patient Details  Name: Andrea Stewart MRN: [id number] Date of Birth: 1946-02-01  Transition of [facility] CM/SW [place], RN Phone Number: 09/30/2021, 9:16 AM  Clinical Narrative:   Patient to DC to CIR today via [facility] for transport,          Expected Discharge Plan and Services           Expected Discharge Date: 09/30/21                                     Social Determinants of Health (SDOH) Interventions    Readmission Risk Interventions No flowsheet data found.

## 2021-09-30 NOTE — Progress Notes (Signed)
Patient was placed on aspirin 81 mg twice daily prior to discharge from [facility] for DVT prophylaxis

## 2021-09-30 NOTE — Progress Notes (Addendum)
Inpatient Rehabilitation Admissions Coordinator   [facility] bed is available to admit her today. I spoke with her by phone and she is in agreement. I have contacted Dr Priscella Mann via secure chat to confirm ok to discharge to [facility] today. Dr Marval Regal with [facility] as well as [facility] via [facility] Munro, have been made aware of admit planned today. I will verify Bed number and make arrangements via [place] and [facility] staff of transport one cleared for discharge by Attending MD.  Danne Baxter, RN, MSN Rehab Admissions Coordinator [phone] 09/30/2021 8:41 AM   She is to admit to room 4w18 by Dr Ranell Patrick at [facility] rehab. I have called [facility] for room to be ready at 1230 today.  Danne Baxter, RN, MSN Rehab Admissions Coordinator [phone] 09/30/2021 9:56 AM

## 2021-09-30 NOTE — Progress Notes (Signed)
Inpatient Rehabilitation Admission Medication Review by a Pharmacist  A complete drug regimen review was completed for this patient to identify any potential clinically significant medication issues.  High Risk Drug Classes Is patient taking? Indication by Medication  Antipsychotic No   Anticoagulant Yes Heparin only for fibrin in PD bags. TED hose to BL LE.  Antibiotic No   Opioid Yes Oxycodone for pain s/p hip fx.  Antiplatelet No   Hypoglycemics/insulin Yes SSI, Levemir for DM  Vasoactive Medication Yes Toprol, Spironolactone for HTN  Chemotherapy No   Other No      Type of Medication Issue Identified Description of Issue Recommendation(s)  Drug Interaction(s) (clinically significant)     Duplicate Therapy     Allergy     No Medication Administration End Date     Incorrect Dose     Additional Drug Therapy Needed  Uncontrolled glucoses Consider increasing Levemir vs PTA NPH/U500.  Significant med changes from prior encounter (inform family/care partners about these prior to discharge).    Other  ASA 81mg  BID resumed at inpatient discharge, but unsure of indication.(Myalgia and myositis in PMH?) Resume ASA 81mg  BID?    Clinically significant medication issues were identified that warrant physician communication and completion of prescribed/recommended actions by midnight of the next day:  No  Time spent performing this drug regimen review (minutes):  15-20min  Leaha Cuervo S. Alford Highland, PharmD, BCPS Clinical Staff Pharmacist [email or web address] [place] 09/30/2021 1:44 PM

## 2021-09-30 NOTE — Progress Notes (Signed)
Follow up with Dialysis RNJeanene Erb) regarding patient concerns for starting her PD, states she is next on the list, patient informed by nurse

## 2021-10-01 LAB — CBC
HCT: 31.9 % — ABNORMAL LOW (ref 36.0–46.0)
Hemoglobin: 10.4 g/dL — ABNORMAL LOW (ref 12.0–15.0)
MCH: 30.6 pg (ref 26.0–34.0)
MCHC: 32.6 g/dL (ref 30.0–36.0)
MCV: 93.8 fL (ref 80.0–100.0)
Platelets: 321 10*3/uL (ref 150–400)
RBC: 3.4 MIL/uL — ABNORMAL LOW (ref 3.87–5.11)
RDW: 14.6 % (ref 11.5–15.5)
WBC: 6 10*3/uL (ref 4.0–10.5)
nRBC: 0 % (ref 0.0–0.2)

## 2021-10-01 LAB — RENAL FUNCTION PANEL
Albumin: 1.6 g/dL — ABNORMAL LOW (ref 3.5–5.0)
Anion gap: 7 (ref 5–15)
BUN: 43 mg/dL — ABNORMAL HIGH (ref 8–23)
CO2: 26 mmol/L (ref 22–32)
Calcium: 8.2 mg/dL — ABNORMAL LOW (ref 8.9–10.3)
Chloride: 93 mmol/L — ABNORMAL LOW (ref 98–111)
Creatinine, Ser: 5.86 mg/dL — ABNORMAL HIGH (ref 0.44–1.00)
GFR, Estimated: 7 mL/min — ABNORMAL LOW (ref >60)
Glucose, Bld: 335 mg/dL — ABNORMAL HIGH (ref 70–99)
Phosphorus: 4.1 mg/dL (ref 2.5–4.6)
Potassium: 4.1 mmol/L (ref 3.5–5.1)
Sodium: 126 mmol/L — ABNORMAL LOW (ref 135–145)

## 2021-10-01 LAB — GLUCOSE, CAPILLARY
Glucose-Capillary: 311 mg/dL — ABNORMAL HIGH (ref 70–99)
Glucose-Capillary: 217 mg/dL — ABNORMAL HIGH (ref 70–99)
Glucose-Capillary: 159 mg/dL — ABNORMAL HIGH (ref 70–99)
Glucose-Capillary: 350 mg/dL — ABNORMAL HIGH (ref 70–99)

## 2021-10-01 LAB — IRON AND TIBC
Iron: 47 ug/dL (ref 28–170)
Saturation Ratios: 23 % (ref 10.4–31.8)
TIBC: 206 ug/dL — ABNORMAL LOW (ref 250–450)
UIBC: 159 ug/dL

## 2021-10-01 LAB — VITAMIN D 25 HYDROXY (VIT D DEFICIENCY, FRACTURES): Vit D, 25-Hydroxy: 55.52 ng/mL (ref 30–100)

## 2021-10-01 LAB — MAGNESIUM: Magnesium: 1.7 mg/dL (ref 1.7–2.4)

## 2021-10-01 LAB — FERRITIN: Ferritin: 114 ng/mL (ref 11–307)

## 2021-10-01 MED ORDER — DELFLEX-LC/2.5% DEXTROSE 394 MOSM/L IP SOLN
Freq: Every day | INTRAPERITONEAL | Status: DC
  Administered 2021-10-01: 18:00:00 5000 mL via INTRAPERITONEAL

## 2021-10-01 MED ORDER — OXYCODONE HCL 5 MG PO TABS
5.0000 mg | ORAL_TABLET | ORAL | Status: DC | PRN
  Administered 2021-10-01 – 2021-10-03 (×6): 5 mg via ORAL
  Filled 2021-10-01 (×6): qty 1

## 2021-10-01 MED ORDER — DARBEPOETIN ALFA 25 MCG/0.42ML IJ SOSY
25.0000 ug | PREFILLED_SYRINGE | INTRAMUSCULAR | Status: DC
  Filled 2021-10-01: qty 0.42

## 2021-10-01 MED ORDER — OXYCODONE HCL 5 MG PO TABS: 2.5000 mg | ORAL_TABLET | Freq: Four times a day (QID) | ORAL | Status: DC | PRN

## 2021-10-01 MED ORDER — INSULIN ASPART 100 UNIT/ML IJ SOLN
5.0000 [IU] | Freq: Three times a day (TID) | INTRAMUSCULAR | Status: DC
Start: 2021-10-01 — End: 2021-10-02
  Administered 2021-10-01 – 2021-10-02 (×4): 5 [IU] via SUBCUTANEOUS

## 2021-10-01 NOTE — Progress Notes (Signed)
Physical Therapy Session Note  Patient Details  Name: Andrea Stewart MRN: [id number] Date of Birth: 1946/06/26  Today's Date: 10/01/2021 PT Individual Time: 1300-1411 PT Individual Time Calculation (min): 71 min   Short Term Goals: Week 1:  PT Short Term Goal 1 (Week 1): pt will perform bed mobility with min A PT Short Term Goal 2 (Week 1): pt will transfer sit<>stand with LRAD and supervision PT Short Term Goal 3 (Week 1): pt will ambulate 33ft with LRAD and supervision  Skilled Therapeutic Interventions/Progress Updates:   Received pt semi-reclined in bed, pt agreeable to PT treatment, and denied any pain at rest but reported increased R shoulder pain to 6-7/10 with movement (premedicated). Repositioning, rest breaks, and distraction done to reduce pain levels. Session with emphasis on functional mobility/transfers, toileting, generalized strengthening, dynamic standing balance/coordination, and improved activity tolerance. Provided pt with 20x18 manual WC with elevating legrests. Pt reported urge to toilet and transferred semi-reclined<>sitting EOB with HOB elevated and use of bedrails with mod A. Pt declined ambulating to bathroom, therefore transferred bed<>bedside commode with hemi-walker and min A (mod A to stand from bed). Pt required total A for clothing management and therapist placed trash can underneath feet for support and improved pelvic alignment for toileting. Will notify OT that pt needs lower sitting commode - required mod A to scoot R hip back onto commode. Pt ultimately unable to have BM, just void minimally. Pt required total A for peri-care in standing and transferred bedside commode<>bed<>WC with hemi walker and min A with rest breaks in between each transfer. Pt required max A to reposition hips in WC. Pt transported to/from room in [facility] total A for time management and energy conservation purposes. Pt performed seated BLE strengthening on Kinetron at 30 cm/sec for 4x10 reps  with emphasis on quad/glute strength and R hip ROM - noted decreased ROM due to pain. Concluded session with pt sitting in WC, needs within reach, and chair pad alarm on. Provided pt with ice packs for R hip and R shoulder for pain relief.  Of note, pt breathing heavily during session, requiring multiple rest breaks and limited by pain, fatigue, and SOB - O2 sat >95% throughout session.   Therapy Documentation Precautions:  Precautions Precautions: None Precaution Comments: WBAT on RLE and RUE, RUE sling for comfort Required Braces or Orthoses: Sling (for comfort) Restrictions Weight Bearing Restrictions: Yes RUE Weight Bearing: Weight bearing as tolerated RLE Weight Bearing: Weight bearing as tolerated Other Position/Activity Restrictions: RUE sling for comfort  Therapy/Group: Individual Therapy Alfonse Alpers PT, DPT   10/01/2021, 7:42 AM

## 2021-10-01 NOTE — Plan of Care (Signed)
  Problem: RH Balance Goal: LTG Patient will maintain dynamic standing with ADLs (OT) Description: LTG:  Patient will maintain dynamic standing balance with assist during activities of daily living (OT)  Flowsheets (Taken 10/01/2021 0927) LTG: Pt will maintain dynamic standing balance during ADLs with: Supervision/Verbal cueing   Problem: Sit to Stand Goal: LTG:  Patient will perform sit to stand in prep for activites of daily living with assistance level (OT) Description: LTG:  Patient will perform sit to stand in prep for activites of daily living with assistance level (OT) Flowsheets (Taken 10/01/2021 0927) LTG: PT will perform sit to stand in prep for activites of daily living with assistance level: Supervision/Verbal cueing   Problem: RH Eating Goal: LTG Patient will perform eating w/assist, cues/equip (OT) Description: LTG: Patient will perform eating with assist, with/without cues using equipment (OT) Flowsheets (Taken 10/01/2021 0927) LTG: Pt will perform eating with assistance level of: Independent with assistive device    Problem: RH Grooming Goal: LTG Patient will perform grooming w/assist,cues/equip (OT) Description: LTG: Patient will perform grooming with assist, with/without cues using equipment (OT) Flowsheets (Taken 10/01/2021 0927) LTG: Pt will perform grooming with assistance level of: Independent with assistive device    Problem: RH Bathing Goal: LTG Patient will bathe all body parts with assist levels (OT) Description: LTG: Patient will bathe all body parts with assist levels (OT) Flowsheets (Taken 10/01/2021 0927) LTG: Pt will perform bathing with assistance level/cueing: Contact Guard/Touching assist   Problem: RH Dressing Goal: LTG Patient will perform upper body dressing (OT) Description: LTG Patient will perform upper body dressing with assist, with/without cues (OT). Flowsheets (Taken 10/01/2021 0927) LTG: Pt will perform upper body dressing with assistance  level of: Contact Guard/Touching assist Goal: LTG Patient will perform lower body dressing w/assist (OT) Description: LTG: Patient will perform lower body dressing with assist, with/without cues in positioning using equipment (OT) Flowsheets (Taken 10/01/2021 0927) LTG: Pt will perform lower body dressing with assistance level of: Contact Guard/Touching assist   Problem: RH Toileting Goal: LTG Patient will perform toileting task (3/3 steps) with assistance level (OT) Description: LTG: Patient will perform toileting task (3/3 steps) with assistance level (OT)  Flowsheets (Taken 10/01/2021 0927) LTG: Pt will perform toileting task (3/3 steps) with assistance level: Contact Guard/Touching assist   Problem: RH Toilet Transfers Goal: LTG Patient will perform toilet transfers w/assist (OT) Description: LTG: Patient will perform toilet transfers with assist, with/without cues using equipment (OT) Flowsheets (Taken 10/01/2021 0927) LTG: Pt will perform toilet transfers with assistance level of: Supervision/Verbal cueing   Problem: RH Tub/Shower Transfers Goal: LTG Patient will perform tub/shower transfers w/assist (OT) Description: LTG: Patient will perform tub/shower transfers with assist, with/without cues using equipment (OT) Flowsheets (Taken 10/01/2021 0927) LTG: Pt will perform tub/shower stall transfers with assistance level of: Supervision/Verbal cueing

## 2021-10-01 NOTE — Progress Notes (Signed)
Inpatient Rehabilitation Center Individual Statement of Services  Patient Name:  Andrea Stewart  Date:  10/01/2021  Welcome to the [facility].  Our goal is to provide you with an individualized program based on your diagnosis and situation, designed to meet your specific needs.  With this comprehensive rehabilitation program, you will be expected to participate in at least 3 hours of rehabilitation therapies Monday-Friday, with modified therapy programming on the weekends.  Your rehabilitation program will include the following services:  Physical Therapy (PT), Occupational Therapy (OT), Speech Therapy (ST), 24 hour per day rehabilitation nursing, Therapeutic Recreaction (TR), Neuropsychology, Care Coordinator, Rehabilitation Medicine, Nutrition Services, Pharmacy Services, and Other  Weekly team conferences will be held on Wednesdays to discuss your progress.  Your Inpatient Rehabilitation Care Coordinator will talk with you frequently to get your input and to update you on team discussions.  Team conferences with you and your family in attendance may also be held.  Expected length of stay: 7-10 Days  Overall anticipated outcome:  MOD I/Supervision  Depending on your progress and recovery, your program may change. Your Inpatient Rehabilitation Care Coordinator will coordinate services and will keep you informed of any changes. Your Inpatient Rehabilitation Care Coordinator's name and contact numbers are listed  below.  The following services may also be recommended but are not provided by the [facility]:   [place] will be made to provide these services after discharge if needed.  Arrangements include referral to agencies that provide these services.  Your insurance has been verified to be:  HT[employer] Your primary doctor is:  Emily Filbert, MD  Pertinent information will be  shared with your doctor and your insurance company.  Inpatient Rehabilitation Care Coordinator:  Erlene Quan, [place] or [phone]  Information discussed with and copy given to patient by: Dyanne Iha, 10/01/2021, 11:44 AM

## 2021-10-01 NOTE — Progress Notes (Signed)
Occupational Therapy Session Note  Patient Details  Name: Andrea Stewart MRN: [id number] Date of Birth: 02-16-1946  Today's Date: 10/02/2021 OT Individual Time: 1103-1200 OT Individual Time Calculation (min): 57 min    Short Term Goals: Week 1:  OT Short Term Goal 1 (Week 1): Patient will use adaptive equipment for LB ADLs with mod A OT Short Term Goal 2 (Week 1): Patient will complete 1 step of toileting task OT Short Term Goal 3 (Week 1): Patient will maintain standing balance for 2 minutes in preparation for BADL Task  Skilled Therapeutic Interventions/Progress Updates:    Pt greeted in the w/c, already wearing her Rt UE sling, requesting to use the restroom. Wanting to try the transfer without hemi walker. Mod A for stand pivot<standard toilet. Advise using hemi walker during future OT sessions to help her safely step back better. Max A for clothing mgt and then pt had bladder void. Needed OT to warm up some prune juice to help with voiding bowels. Education provided on potty-squat positioning and rocking techniques. Worked on sit<stands and standing balance during toileting tasks, pt using the hemi walker at this time. Mod A for sit<stands and for stand pivot<w/c after using device. While seated at the sink, pt washed both hands given Mod A. Min A for oral care, pt initiating using her Rt hand. She remained sitting up at close of session, all needs within reach and safety belt fastened. Tx focus placed on ADL retraining, functional transfers, sit<stands, and precaution adherence during functional activity.    Therapy Documentation Precautions:  Precautions Precautions: None Precaution Comments: WBAT on RLE and RUE, RUE sling for comfort Required Braces or Orthoses: Sling Restrictions Weight Bearing Restrictions: Yes RUE Weight Bearing: Weight bearing as tolerated LUE Weight Bearing: Weight bearing as tolerated RLE Weight Bearing: Weight bearing as tolerated LLE Weight Bearing:  Weight bearing as tolerated Other Position/Activity Restrictions: RUE sling for comfort  Pain: in the Rt shoulder, notified RN of her request for pain medicine   ADL: ADL Eating: Minimal assistance Grooming: Minimal assistance Upper Body Bathing: Maximal assistance Lower Body Bathing: Dependent Upper Body Dressing: Maximal assistance Lower Body Dressing: Dependent Toileting: Maximal assistance Toilet Transfer: Moderate assistance Tub/Shower Transfer Method: Unable to assess  Therapy/Group: Individual Therapy  Stephano Arrants A Deakon Frix 10/02/2021, 12:20 PM

## 2021-10-01 NOTE — Evaluation (Signed)
Occupational Therapy Assessment and Plan  Patient Details  Name: Andrea Stewart MRN: [id number] Date of Birth: [birth year]  OT Diagnosis: acute pain, muscle weakness (generalized), and pain in joint Rehab Potential: Rehab Potential (ACUTE ONLY): Good ELOS: 12-14 days   Today's Date: 10/01/2021 OT Individual Time: 0850-1000 OT Individual Time Calculation (min): 70 min     Hospital Problem: Principal Problem:   Closed right hip fracture ([place]) Active Problems:   Intertrochanteric fracture of right hip ([place])   Past Medical History:  Past Medical History:  Diagnosis Date   Backache, unspecified    s/p c-spine and l-spine fusions   COPD (chronic obstructive pulmonary disease) ([place])    Degeneration of intervertebral disc, site unspecified    Depressive disorder, not elsewhere classified    Disorder of bone and cartilage, unspecified    Family history of adverse reaction to anesthesia    mother did, but patient not sure of the complication   HTN (hypertension)    Hx of left heart catheterization by cutdown    a. 7 years ago; no significant cad; b. Lexiscan 2014: no significant ischemia, no significant EKG changes concerning for ischemia, EF 67%, overall low risk study   Kidney failure    Dr Cyndia Diver   Myalgia and myositis, unspecified    Obesity, unspecified    Personal history of tobacco use, presenting hazards to health    1/2 ppd   Plantar fascial fibromatosis    S/P cholecystectomy    S/p nephrectomy    Type II or unspecified type diabetes mellitus without mention of complication, not stated as uncontrolled    Unspecified essential hypertension    Unspecified hereditary and idiopathic peripheral neuropathy    secondary to diabetes   Past Surgical History:  Past Surgical History:  Procedure Laterality Date   ABDOMINAL HYSTERECTOMY     APPENDECTOMY     BACK SURGERY     1966 and 2006    CARDIAC CATHETERIZATION     o.k. 2003   CARPAL TUNNEL RELEASE     CERVICAL  DISCECTOMY     CHOLECYSTECTOMY     COLONOSCOPY  09/1999   colonoscopy-hemorrhoids, re-check 5 years (10/2006)   dexa     osteopenia (01/2004)   DIALYSIS/PERMA CATHETER INSERTION Right 01/05/2021   Procedure: DIALYSIS/PERMA CATHETER INSERTION;  Surgeon: Algernon Huxley, MD;  Location: [facility];  Service: Cardiovascular;  Laterality: Right;   DIALYSIS/PERMA CATHETER REMOVAL N/A 02/05/2021   Procedure: DIALYSIS/PERMA CATHETER REMOVAL;  Surgeon: Algernon Huxley, MD;  Location: [facility] [facility];  Service: Cardiovascular;  Laterality: N/A;   ELBOW SURGERY     ESOPHAGOGASTRODUODENOSCOPY (EGD) WITH PROPOFOL N/A 07/23/2020   Procedure: ESOPHAGOGASTRODUODENOSCOPY (EGD) WITH PROPOFOL;  Surgeon: Robert Bellow, MD;  Location: [facility];  Service: Endoscopy;  Laterality: N/A;  TRAVEL TO O.R. - PATIENT TO HAVE SURGERY   FEMUR IM NAIL Left 01/01/2021   Procedure: INTRAMEDULLARY (IM) NAIL FEMORAL;  Surgeon: Lovell Sheehan, MD;  Location: [facility];  Service: Orthopedics;  Laterality: Left;   FOOT SURGERY     x 3   HIP FRACTURE SURGERY Left    JOINT REPLACEMENT     KIDNEY DONATION  1991   LAPAROSCOPIC ABDOMINAL EXPLORATION N/A 07/23/2020   Procedure: LAPAROSCOPIC ABDOMINAL EXPLORATION;  Surgeon: Robert Bellow, MD;  Location: [facility];  Service: General;  Laterality: N/A;   LUMBAR FUSION     NECK SURGERY     06/2005   Problem with general Anesthesia  02/2006   REPLACEMENT TOTAL KNEE  10/2004   trigger finger surgery     UPPER GI ENDOSCOPY N/A 07/23/2020   Procedure: UPPER GI ENDOSCOPY;  Surgeon: Robert Bellow, MD;  Location: [facility];  Service: General;  Laterality: N/A;   VENTRAL HERNIA REPAIR N/A 09/08/2017   Primary repair of a 10 x 15 mm infraumbilical fascial defect.  Surgeon: Robert Bellow, MD;  Location: [facility];  Service: General;  Laterality: N/A;   VESICOVAGINAL FISTULA CLOSURE W/ TAH     VIDEO BRONCHOSCOPY Bilateral 04/02/2015   Procedure: VIDEO  BRONCHOSCOPY WITHOUT FLUORO;  Surgeon: Juanito Doom, MD;  Location: [facility];  Service: Cardiopulmonary;  Laterality: Bilateral;    Assessment & Plan Clinical Impression: Patient is a 75 y.o. year old female with medical history of COPD, depression, HTN, type 2 DM and ESRD on peritoneal dialysis. Presented to [facility] on 11/7 with mechanical fall while using her BSC, stumbled and fell with subsequent pain to should her hip. Imaging showed a nondisplaced fracture of the lateral humeral head and an undisplaced right femoral intra trochanteric fracture. Ortho consulted and recommended non operative management. RUE sling and oral pain regimen. HTN to continue on Lasix and spironolactone and metoprolol. Had an episode of hypoglycemia with mediation adjustment. Nephrology consulted to manage peritoneal dialysis. On 11/11 noted a significant amount of fibrin within the drain bag. Last fill administered heparin. To monitor. Patient transferred to CIR on 09/30/2021 .    Patient currently requires max with basic self-care skills secondary to muscle weakness, decreased cardiorespiratoy endurance, and decreased sitting balance, decreased standing balance, decreased postural control, decreased balance strategies, and difficulty maintaining precautions.  Prior to hospitalization, patient could complete BADL with supervision/ Min A for LB ADLs  Patient will benefit from skilled intervention to increase independence with basic self-care skills prior to discharge home with care partner.  Anticipate patient will require 24 hour supervision and minimal physical assistance and follow up home health.  OT - End of Session Endurance Deficit: Yes Endurance Deficit Description: required rest breaks throughout session OT Assessment Rehab Potential (ACUTE ONLY): Good OT Barriers to Discharge: Decreased caregiver support OT Barriers to Discharge Comments: CIR OT Patient demonstrates impairments in the following area(s):  Balance;Behavior;Edema;Endurance;Motor;Nutrition;Pain;Perception;Skin Integrity;Sensory;Safety OT Basic ADL's Functional Problem(s): Eating;Grooming;Bathing;Dressing;Toileting OT Transfers Functional Problem(s): Tub/Shower;Toilet OT Additional Impairment(s): Fuctional Use of Upper Extremity OT Plan OT Intensity: Minimum of 1-2 x/day, 45 to 90 minutes OT Frequency: 5 out of 7 days OT Duration/Estimated Length of Stay: 12-14 days OT Treatment/Interventions: Balance/vestibular training;Cognitive remediation/compensation;Community reintegration;Discharge planning;Disease mangement/prevention;DME/adaptive equipment instruction;Functional electrical stimulation;Functional mobility training;Neuromuscular re-education;Pain management;Patient/family education;Psychosocial support;Skin care/wound managment;Splinting/orthotics;Therapeutic Activities;Self Care/advanced ADL retraining;UE/LE Coordination activities;Visual/perceptual remediation/compensation;Therapeutic Exercise;UE/LE Strength taining/ROM;Wheelchair propulsion/positioning OT Self Feeding Anticipated Outcome(s): Mod I OT Basic Self-Care Anticipated Outcome(s): Supervision/CGA OT Toileting Anticipated Outcome(s): CGA OT Bathroom Transfers Anticipated Outcome(s): Supervision OT Recommendation Patient destination: Home Follow Up Recommendations: Home health OT Equipment Recommended: To be determined   OT Evaluation Precautions/Restrictions  Precautions Precautions: None Precaution Comments: WBAT on RLE and RUE, RUE sling for comfort Required Braces or Orthoses: Sling Restrictions Weight Bearing Restrictions: Yes RUE Weight Bearing: Weight bearing as tolerated LUE Weight Bearing: Weight bearing as tolerated RLE Weight Bearing: Weight bearing as tolerated LLE Weight Bearing: Weight bearing as tolerated Other Position/Activity Restrictions: RUE sling for comfort General   Vital Signs Therapy Vitals Temp: 97.7 F (36.5 C) Pulse Rate:  91 Resp: 17 BP: 112/71 Patient Position (if appropriate): Lying Oxygen Therapy SpO2: (!) 89 % O2  Device: Room Air Pain Pain Assessment Pain Scale: 0-10 Pain Score: Asleep Home Living/Prior Functioning Home Living Living Arrangements: Spouse/significant other Available Help at Discharge: Family, Available 24 hours/day (husband currently in hospital, pt reports 4 sons and grandsons, and an aid for IADLs, will see if available 24/7) Type of Home: House Home Access: Ramped entrance Home Layout: One level Bathroom Shower/Tub: Walk-in shower Bathroom Toilet: Handicapped height Bathroom Accessibility: Yes Additional Comments: Patienton home peritoneal dialysis. Her spouse is her trained partner and does most of the setup but he is also a CIR patient at the same time as she  Lives With: Spouse IADL History Current License: No Leisure and Hobbies: Spending time with her husband, go out to eat IADL Comments: Has a transport chair, rollator, bediside commode, walk-in shower with shower seat and grab bars Prior Function Level of Independence: Independent with basic ADLs, Needs assistance with homemaking, Requires assistive device for independence (using rollator in the house)  Able to Take Stairs?: No Driving: No Vocation: Retired Vision Baseline Vision/History: 1 Wears glasses Ability to See in Adequate Light: 1 Impaired Patient Visual Report: No change from baseline Perception  Perception: Within Functional Limits Praxis   Cognition Overall Cognitive Status: Within Functional Limits for tasks assessed Arousal/Alertness: Awake/alert Orientation Level: Person;Place;Situation Person: Oriented Place: Oriented Situation: Oriented Year: 2022 Month: November Day of Week: Correct Memory: Appears intact Immediate Memory Recall: Sock;Blue;Bed Memory Recall Sock: Without Cue Memory Recall Blue: Without Cue Memory Recall Bed: With Cue Awareness: Appears intact Problem Solving:  Appears intact Safety/Judgment: Appears intact Sensation Sensation Light Touch: Appears Intact Proprioception: Appears Intact Additional Comments: tingling in feet at night Coordination Gross Motor Movements are Fluid and Coordinated: No Fine Motor Movements are Fluid and Coordinated: No Coordination and Movement Description: Coordination deficits in R hand due to pain and swelling Finger Nose Finger Test: dysmetria and tremors on LLE, unable to lift RUE Heel Shin Test: decreased ROM on LLE, unable to lift RLE Motor  Motor Motor: Abnormal postural alignment and control Motor - Skilled Clinical Observations: grossly uncoordinated due to pain, weakness/deconditioning, and decreased balance  Trunk/Postural Assessment  Cervical Assessment Cervical Assessment: Exceptions to WFL (forward head) Thoracic Assessment Thoracic Assessment: Exceptions to WFL (kyphosis) Lumbar Assessment Lumbar Assessment: Exceptions to WFL (posterior pelvic tilt) Postural Control Postural Control: Deficits on evaluation (delayed in standing)  Balance Balance Balance Assessed: Yes Static Sitting Balance Static Sitting - Balance Support: Feet supported;Left upper extremity supported Static Sitting - Level of Assistance: 5: Stand by assistance (supervision) Dynamic Sitting Balance Dynamic Sitting - Balance Support: Feet supported;No upper extremity supported Dynamic Sitting - Level of Assistance: 5: Stand by assistance (supervision) Static Standing Balance Static Standing - Balance Support: Left upper extremity supported (HW) Static Standing - Level of Assistance: 5: Stand by assistance (CGA) Dynamic Standing Balance Dynamic Standing - Balance Support: Left upper extremity supported (HW) Dynamic Standing - Level of Assistance: 4: Min assist Dynamic Standing - Comments: with gait Extremity/Trunk Assessment RUE Assessment RUE Assessment: Exceptions to WFL General Strength Comments: Able to wiggle fingers,  flex elbow 10 degrees, no movement from shoulder due to pain LUE Assessment LUE Assessment: Within Functional Limits  Care Tool Care Tool Self Care Eating   Eating Assist Level: Minimal Assistance - Patient > 75%    Oral Care    Oral Care Assist Level: Minimal Assistance - Patient > 75%    Bathing   Body parts bathed by patient: Chest;Abdomen;Face Body parts bathed by helper: Right arm;Left arm;Buttocks;Front perineal   area;Left upper leg;Right upper leg;Right lower leg;Left lower leg   Assist Level: Maximal Assistance - Patient 24 - 49%    Upper Body Dressing(including orthotics)   What is the patient wearing?: Pull over shirt   Assist Level: Total Assistance - Patient < 25%    Lower Body Dressing (excluding footwear)   What is the patient wearing?: Underwear/pull up;Pants Assist for lower body dressing: Total Assistance - Patient < 25%    Putting on/Taking off footwear   What is the patient wearing?: Non-skid slipper socks Assist for footwear: Total Assistance - Patient < 25%       Care Tool Toileting Toileting activity   Assist for toileting: Total Assistance - Patient < 25%     Care Tool Bed Mobility Roll left and right activity   Roll left and right assist level: Moderate Assistance - Patient 50 - 74%    Sit to lying activity   Sit to lying assist level: Maximal Assistance - Patient 25 - 49%    Lying to sitting on side of bed activity   Lying to sitting on side of bed assist level: the ability to move from lying on the back to sitting on the side of the bed with no back support.: Maximal Assistance - Patient 25 - 49%     Care Tool Transfers Sit to stand transfer   Sit to stand assist level: Moderate Assistance - Patient 50 - 74% (hemi walker)    Chair/bed transfer   Chair/bed transfer assist level: Minimal Assistance - Patient > 75% (hemi-walker)     Toilet transfer   Assist Level: Moderate Assistance - Patient 50 - 74%     Care Tool  Cognition  Expression of Ideas and Wants Expression of Ideas and Wants: 3. Some difficulty - exhibits some difficulty with expressing needs and ideas (e.g, some words or finishing thoughts) or speech is not clear  Understanding Verbal and Non-Verbal Content Understanding Verbal and Non-Verbal Content: 4. Understands (complex and basic) - clear comprehension without cues or repetitions   Memory/Recall Ability Memory/Recall Ability : Current season;Location of own room;Staff names and faces;That he or she is in a hospital/hospital unit   Refer to Care Plan for Long Term Goals  SHORT TERM GOAL WEEK 1 OT Short Term Goal 1 (Week 1): Patient will use adaptive equipment for LB ADLs with mod A OT Short Term Goal 2 (Week 1): Patient will complete 1 step of toileting task OT Short Term Goal 3 (Week 1): Patient will maintain standing balance for 2 minutes in preparation for BADL Task  Recommendations for other services: None    Skilled Therapeutic Intervention Pt greeted semi-reclined in bed, still hooked up to PD from the night. Nursing called multiple times, but called again to get her unhooked so she could participate in mobility and BADL part of  evaluation. OT eval completed addressing rehab process, OT purpose, POC, ELOS, and goals. PT then unhooked from PD and reported need to go to the bathroom. Pt needed max A for bed mobility with difficulty advancing R LE at all and very gaurded with all movement to R UE. Stand-pivot transfer without AD and mod A to BSC. Pt needed max/total A for all BADL tasks, although transfer was descent. See below for further details below regarding BADL performance. Pt returned to bed with max A to lift LE's back in bed. Pt left semi-reclined with call bell in reach, bed alarm on, and needs met.    ADL ADL Eating:   Minimal assistance Grooming: Minimal assistance Upper Body Bathing: Maximal assistance Lower Body Bathing: Dependent Upper Body Dressing: Maximal  assistance Lower Body Dressing: Dependent Toileting: Maximal assistance Toilet Transfer: Moderate assistance Tub/Shower Transfer Method: Unable to assess Mobility  Bed Mobility Bed Mobility: Supine to Sit Rolling Left: Moderate Assistance - Patient 50-74% Supine to Sit: Maximal Assistance - Patient - Patient 25-49% Sit to Supine: Maximal Assistance - Patient 25-49% Transfers Sit to Stand: Moderate Assistance - Patient 50-74% Stand to Sit: Minimal Assistance - Patient > 75%   Discharge Criteria: Patient will be discharged from OT if patient refuses treatment 3 consecutive times without medical reason, if treatment goals not met, if there is a change in medical status, if patient makes no progress towards goals or if patient is discharged from hospital.  The above assessment, treatment plan, treatment alternatives and goals were discussed and mutually agreed upon: by patient  Valma Cava 10/01/2021, 3:15 PM

## 2021-10-01 NOTE — Progress Notes (Signed)
Inpatient Rehabilitation Care Coordinator Assessment and Plan Patient Details  Name: Andrea Stewart MRN: [id number] Date of Birth: 08/25/1946  Today's Date: 10/01/2021  Hospital Problems: Principal Problem:   Closed right hip fracture [facility]) Active Problems:   Intertrochanteric fracture of right hip [facility])  Past Medical History:  Past Medical History:  Diagnosis Date   Backache, unspecified    s/p c-spine and l-spine fusions   COPD (chronic obstructive pulmonary disease) ([place])    Degeneration of intervertebral disc, site unspecified    Depressive disorder, not elsewhere classified    Disorder of bone and cartilage, unspecified    Family history of adverse reaction to anesthesia    mother did, but patient not sure of the complication   HTN (hypertension)    Hx of left heart catheterization by cutdown    a. 7 years ago; no significant cad; b. Lexiscan 2014: no significant ischemia, no significant EKG changes concerning for ischemia, EF 67%, overall low risk study   Kidney failure    Dr Cyndia Diver   Myalgia and myositis, unspecified    Obesity, unspecified    Personal history of tobacco use, presenting hazards to health    1/2 ppd   Plantar fascial fibromatosis    S/P cholecystectomy    S/p nephrectomy    Type II or unspecified type diabetes mellitus without mention of complication, not stated as uncontrolled    Unspecified essential hypertension    Unspecified hereditary and idiopathic peripheral neuropathy    secondary to diabetes   Past Surgical History:  Past Surgical History:  Procedure Laterality Date   ABDOMINAL HYSTERECTOMY     APPENDECTOMY     BACK SURGERY     1966 and 2006    CARDIAC CATHETERIZATION     o.k. 2003   CARPAL TUNNEL RELEASE     CERVICAL DISCECTOMY     CHOLECYSTECTOMY     COLONOSCOPY  09/1999   colonoscopy-hemorrhoids, re-check 5 years (10/2006)   dexa     osteopenia (01/2004)   DIALYSIS/PERMA CATHETER INSERTION Right 01/05/2021   Procedure:  DIALYSIS/PERMA CATHETER INSERTION;  Surgeon: Algernon Huxley, MD;  Location: [place] [facility];  Service: Cardiovascular;  Laterality: Right;   DIALYSIS/PERMA CATHETER REMOVAL N/A 02/05/2021   Procedure: DIALYSIS/PERMA CATHETER REMOVAL;  Surgeon: Algernon Huxley, MD;  Location: [place] [facility];  Service: Cardiovascular;  Laterality: N/A;   ELBOW SURGERY     ESOPHAGOGASTRODUODENOSCOPY (EGD) WITH PROPOFOL N/A 07/23/2020   Procedure: ESOPHAGOGASTRODUODENOSCOPY (EGD) WITH PROPOFOL;  Surgeon: Robert Bellow, MD;  Location: [facility];  Service: Endoscopy;  Laterality: N/A;  TRAVEL TO O.R. - PATIENT TO HAVE SURGERY   FEMUR IM NAIL Left 01/01/2021   Procedure: INTRAMEDULLARY (IM) NAIL FEMORAL;  Surgeon: Lovell Sheehan, MD;  Location: [facility];  Service: Orthopedics;  Laterality: Left;   FOOT SURGERY     x 3   HIP FRACTURE SURGERY Left    JOINT REPLACEMENT     KIDNEY DONATION  1991   LAPAROSCOPIC ABDOMINAL EXPLORATION N/A 07/23/2020   Procedure: LAPAROSCOPIC ABDOMINAL EXPLORATION;  Surgeon: Robert Bellow, MD;  Location: [facility];  Service: General;  Laterality: N/A;   LUMBAR FUSION     NECK SURGERY     06/2005   Problem with general Anesthesia     02/2006   REPLACEMENT TOTAL KNEE  10/2004   trigger finger surgery     UPPER GI ENDOSCOPY N/A 07/23/2020   Procedure: UPPER GI ENDOSCOPY;  Surgeon: Robert Bellow, MD;  Location: [facility]S;  Service: General;  Laterality: N/A;   VENTRAL HERNIA REPAIR N/A 09/08/2017   Primary repair of a 10 x 15 mm infraumbilical fascial defect.  Surgeon: Robert Bellow, MD;  Location: [facility]S;  Service: General;  Laterality: N/A;   VESICOVAGINAL FISTULA CLOSURE W/ TAH     VIDEO BRONCHOSCOPY Bilateral 04/02/2015   Procedure: VIDEO BRONCHOSCOPY WITHOUT FLUORO;  Surgeon: Juanito Doom, MD;  Location: [facility] ENDOSCOPY;  Service: Cardiopulmonary;  Laterality: Bilateral;   Social History:  reports that she has been smoking cigarettes. She has a 7.50  pack-year smoking history. She has never used smokeless tobacco. She reports that she does not drink alcohol and does not use drugs.  Family / Support Systems Spouse/Significant Other: Carloyn Manner Children: Pharmacologist (Son) Anticipated Caregiver: sean and spouse Ability/Limitations of Caregiver: spouse typical caregiver, currently in [place]: support from spouse and children  Social History Preferred language: English Religion: Patient [place] - How often do you need to have someone help you when you read instructions, pamphlets, or other written material from your doctor or pharmacy?: Never Legal History/Current Legal Issues: n/a Guardian/Conservator: n/a   Abuse/Neglect Abuse/Neglect Assessment Can Be Completed: Yes Physical Abuse: Denies Verbal Abuse: Denies Sexual Abuse: Denies Exploitation of patient/patient's resources: Denies Self-Neglect: Denies  Patient response to: Social Isolation - How often do you feel lonely or isolated from those around you?: Never  Emotional Status Recent Psychosocial Issues: coping Psychiatric History: hx of depression , depressivre disorder Substance Abuse History: hx of tobbaco  Patient / Family Perceptions, Expectations & Goals Pt/Family understanding of illness & functional limitations: yes Premorbid pt/family roles/activities: MOD W/ RW. Spouse managing meds and cooking Anticipated changes in roles/activities/participation: Pt family anticipates pt to be doing more independently Pt/family expectations/goals: MOD I to [place] Agencies: None Premorbid Home Care/DME Agencies: Other (Comment) (Rollater, Bedside Commode, WC) Transportation available at discharge: family able to transport Is the patient able to respond to transportation needs?: Yes In the past 12 months, has lack of transportation kept you from medical appointments or from getting medications?: No In the past 12 months, has  lack of transportation kept you from meetings, work, or from getting things needed for daily living?: No Resource referrals recommended: Neuropsychology  Discharge Planning Living Arrangements: Spouse/significant other Support Systems: Spouse/significant other, Children Type of Residence: Private residence Insurance Resources: Multimedia programmer (specify) Financial Resources: Family Support Financial Screen Referred: No Living Expenses: Lives with family Money Management: Patient Does the patient have any problems obtaining your medications?: No Home Management: spouse helps pt manage Patient/Family Preliminary Plans: pt family able to [place] Coordinator Barriers to Discharge: Insurance for SNF coverage, Decreased caregiver support Care Coordinator Anticipated Follow Up Needs: HH/OP Expected length of stay: 7-10 Days  Clinical Impression Sw met with pt, introduced self, explained role and addressed questions and concerns. Pt plans to discharge home with assistance from her son and family. PT spouse currently in [place]. SW called pt family to request more clothing. Unable to et an answer, sw will continue to follow up.   Dyanne Iha 10/01/2021, 1:28 PM

## 2021-10-01 NOTE — Evaluation (Signed)
Physical Therapy Assessment and Plan  Patient Details  Name: Andrea Stewart MRN: [id number] Date of Birth: Jun 07, 1946  PT Diagnosis: Abnormal posture, Abnormality of gait, Difficulty walking, Edema, Muscle weakness, and Pain in R shoulder and R leg Rehab Potential: Good ELOS: 10-12 days   Today's Date: 10/01/2021 PT Individual Time: 1100-1154 PT Individual Time Calculation (min): 38 min    Hospital Problem: Principal Problem:   Closed right hip fracture ([place]) Active Problems:   Intertrochanteric fracture of right hip ([place])   Past Medical History:  Past Medical History:  Diagnosis Date   Backache, unspecified    s/p c-spine and l-spine fusions   COPD (chronic obstructive pulmonary disease) ([place])    Degeneration of intervertebral disc, site unspecified    Depressive disorder, not elsewhere classified    Disorder of bone and cartilage, unspecified    Family history of adverse reaction to anesthesia    mother did, but patient not sure of the complication   HTN (hypertension)    Hx of left heart catheterization by cutdown    a. 7 years ago; no significant cad; b. Lexiscan 2014: no significant ischemia, no significant EKG changes concerning for ischemia, EF 67%, overall low risk study   Kidney failure    Dr Cyndia Diver   Myalgia and myositis, unspecified    Obesity, unspecified    Personal history of tobacco use, presenting hazards to health    1/2 ppd   Plantar fascial fibromatosis    S/P cholecystectomy    S/p nephrectomy    Type II or unspecified type diabetes mellitus without mention of complication, not stated as uncontrolled    Unspecified essential hypertension    Unspecified hereditary and idiopathic peripheral neuropathy    secondary to diabetes   Past Surgical History:  Past Surgical History:  Procedure Laterality Date   ABDOMINAL HYSTERECTOMY     APPENDECTOMY     BACK SURGERY     1966 and 2006    CARDIAC CATHETERIZATION     o.k. 2003   CARPAL TUNNEL RELEASE      CERVICAL DISCECTOMY     CHOLECYSTECTOMY     COLONOSCOPY  09/1999   colonoscopy-hemorrhoids, re-check 5 years (10/2006)   dexa     osteopenia (01/2004)   DIALYSIS/PERMA CATHETER INSERTION Right 01/05/2021   Procedure: DIALYSIS/PERMA CATHETER INSERTION;  Surgeon: Algernon Huxley, MD;  Location: [place] [facility];  Service: Cardiovascular;  Laterality: Right;   DIALYSIS/PERMA CATHETER REMOVAL N/A 02/05/2021   Procedure: DIALYSIS/PERMA CATHETER REMOVAL;  Surgeon: Algernon Huxley, MD;  Location: [place] [facility];  Service: Cardiovascular;  Laterality: N/A;   ELBOW SURGERY     ESOPHAGOGASTRODUODENOSCOPY (EGD) WITH PROPOFOL N/A 07/23/2020   Procedure: ESOPHAGOGASTRODUODENOSCOPY (EGD) WITH PROPOFOL;  Surgeon: Robert Bellow, MD;  Location: [facility];  Service: Endoscopy;  Laterality: N/A;  TRAVEL TO O.R. - PATIENT TO HAVE SURGERY   FEMUR IM NAIL Left 01/01/2021   Procedure: INTRAMEDULLARY (IM) NAIL FEMORAL;  Surgeon: Lovell Sheehan, MD;  Location: [facility];  Service: Orthopedics;  Laterality: Left;   FOOT SURGERY     x 3   HIP FRACTURE SURGERY Left    JOINT REPLACEMENT     KIDNEY DONATION  1991   LAPAROSCOPIC ABDOMINAL EXPLORATION N/A 07/23/2020   Procedure: LAPAROSCOPIC ABDOMINAL EXPLORATION;  Surgeon: Robert Bellow, MD;  Location: [facility];  Service: General;  Laterality: N/A;   LUMBAR FUSION     NECK SURGERY     06/2005   Problem  with general Anesthesia     02/2006   REPLACEMENT TOTAL KNEE  10/2004   trigger finger surgery     UPPER GI ENDOSCOPY N/A 07/23/2020   Procedure: UPPER GI ENDOSCOPY;  Surgeon: Robert Bellow, MD;  Location: [facility];  Service: General;  Laterality: N/A;   VENTRAL HERNIA REPAIR N/A 09/08/2017   Primary repair of a 10 x 15 mm infraumbilical fascial defect.  Surgeon: Robert Bellow, MD;  Location: [facility];  Service: General;  Laterality: N/A;   VESICOVAGINAL FISTULA CLOSURE W/ TAH     VIDEO BRONCHOSCOPY Bilateral 04/02/2015    Procedure: VIDEO BRONCHOSCOPY WITHOUT FLUORO;  Surgeon: Juanito Doom, MD;  Location: [facility] ENDOSCOPY;  Service: Cardiopulmonary;  Laterality: Bilateral;    Assessment & Plan Clinical Impression: Patient is a 75 y.o. year old female with history of COPD/tobacco use, hypertension, diabetes mellitus, obesity with BMI 39.27, end-stage renal disease peritoneal dialysis.  Per chart review patient lives with spouse.  1 level home with ramped entrance.  She has 4 sons and an aide to help with ADLs.  Modified independent for mobility prior to admission.  Her husband is currently admitted to [facility] for intraparenchymal hemorrhage.  Presented to [facility] 09/21/2021 after mechanical fall when she stumbled and fell on her right side.  No loss of consciousness.  Denied any presyncope chest pain or palpitations related to the fall.  Cranial CT scan negative.  CT of the right hip showed undisplaced right femoral intertrochanteric fracture as well as CT of the right shoulder showing nondisplaced fracture of the lateral humeral head with involvement of the greater tuberosity.  Old healed fracture of the humeral diaphysis.  Admission chemistry sodium 134 BUN 33 creatinine 7.53, hemoglobin 13.1 WBC 12,900, hemoglobin A1c 9.7.  Orthopedic service follow-up Dr. Earnestine Leys no current surgical intervention and weightbearing as tolerated right upper extremity and weightbearing as tolerated right lower extremity with right upper extremity sling for comfort.  In regards to patient's end-stage renal disease follow-up nephrology services/[facility] with peritoneal dialysis ongoing.  Close monitoring of blood sugars insulin therapy as directed.  Therapy evaluations completed due to patient decreased functional mobility was admitted for a comprehensive rehab program  Patient currently requires mod with mobility secondary to muscle weakness, decreased cardiorespiratoy endurance, and decreased standing balance,  decreased postural control, and decreased balance strategies.  Prior to hospitalization, patient was modified independent  with mobility and lived with Spouse in a House home.  Home access is  Ramped entrance.  Patient will benefit from skilled PT intervention to maximize safe functional mobility, minimize fall risk, and decrease caregiver burden for planned discharge home with intermittent assist.  Anticipate patient will benefit from follow up [facility] at discharge.  PT - End of Session Activity Tolerance: Tolerates 30+ min activity with multiple rests Endurance Deficit: Yes Endurance Deficit Description: required rest breaks throughout session PT Assessment Rehab Potential (ACUTE/IP ONLY): Good PT Barriers to Discharge: Decreased caregiver support;Wound Care;Lack of/limited family support;Weight bearing restrictions PT Barriers to Discharge Comments: pain, husband is currently a patient in CIR and has intermittent assist from sons at home PT Patient demonstrates impairments in the following area(s): Balance;Edema;Endurance;Motor;Nutrition;Pain;Skin Integrity PT Transfers Functional Problem(s): Bed Mobility;Bed to Chair;Car;Furniture PT Locomotion Functional Problem(s): Ambulation;Wheelchair Mobility;Stairs PT Plan PT Intensity: Minimum of 1-2 x/day ,45 to 90 minutes PT Frequency: 5 out of 7 days PT Duration Estimated Length of Stay: 10-12 days PT Treatment/Interventions: Ambulation/gait training;Discharge planning;Functional mobility training;Psychosocial support;Therapeutic Activities;Balance/vestibular training;Disease management/prevention;Neuromuscular re-education;Skin  care/wound management;Therapeutic Exercise;Wheelchair propulsion/positioning;DME/adaptive equipment instruction;Pain management;Splinting/orthotics;UE/LE Strength taining/ROM;Community reintegration;Patient/family education;Stair training;UE/LE Coordination activities PT Transfers Anticipated Outcome(s): mod I with LRAD PT  Locomotion Anticipated Outcome(s): mod I with LRAD PT Recommendation Recommendations for Other Services: Therapeutic Recreation consult Therapeutic Recreation Interventions: Stress management Follow Up Recommendations: Home health PT Patient destination: Home Equipment Recommended: To be determined Equipment Details: has rollator and transport chair   PT Evaluation Precautions/Restrictions Precautions Precautions: None Precaution Comments: WBAT on RLE and RUE, RUE sling for comfort Required Braces or Orthoses: Sling Restrictions Weight Bearing Restrictions: Yes RUE Weight Bearing: Weight bearing as tolerated LUE Weight Bearing: Weight bearing as tolerated RLE Weight Bearing: Weight bearing as tolerated LLE Weight Bearing: Weight bearing as tolerated Other Position/Activity Restrictions: RUE sling for comfort Pain Interference Pain Interference Pain Effect on Sleep: 4. Almost constantly Pain Interference with Therapy Activities: 3. Frequently Pain Interference with Day-to-Day Activities: 3. Frequently Home Living/Prior Functioning Home Living Available Help at Discharge: Family;Available 24 hours/day (husband currently in hospital, pt reports 4 sons and grandsons, and an aid for IADLs, will see if available 24/7) Type of Home: House Home Access: Ramped entrance Home Layout: One level Bathroom Shower/Tub: Multimedia programmer: Handicapped height Bathroom Accessibility: Yes Additional Comments: Patietn on home peritoneal dialysis. Her spouse is her trained partner and does most of the setup but he is also a CIR patient at the same time as she  Lives With: Spouse Prior Function Level of Independence: Independent with basic ADLs;Needs assistance with homemaking;Requires assistive device for independence (using rollator in the house)  Able to Take Stairs?: No Driving: No Vocation: Retired Radiographer, therapeutic - History Ability to See in Adequate Light: 1  Impaired Perception Perception: Within Functional Limits  Cognition Overall Cognitive Status: Within Functional Limits for tasks assessed Arousal/Alertness: Awake/alert Orientation Level: Oriented X4 Memory: Appears intact Awareness: Appears intact Problem Solving: Appears intact Safety/Judgment: Appears intact Sensation Sensation Light Touch: Appears Intact Proprioception: Appears Intact Additional Comments: tingling in feet at night Coordination Gross Motor Movements are Fluid and Coordinated: No Fine Motor Movements are Fluid and Coordinated: No Coordination and Movement Description: Coordination deficits in R hand due to pain and swelling Finger Nose Finger Test: dysmetria and tremors on LLE, unable to lift RUE Heel Shin Test: decreased ROM on LLE, unable to lift RLE Motor  Motor Motor: Abnormal postural alignment and control Motor - Skilled Clinical Observations: grossly uncoordinated due to pain, weakness/deconditioning, and decreased balance  Trunk/Postural Assessment  Cervical Assessment Cervical Assessment: Exceptions to [facility] (forward head) Thoracic Assessment Thoracic Assessment: Exceptions to [facility] (kyphosis) Lumbar Assessment Lumbar Assessment: Exceptions to [facility] (posterior pelvic tilt) Postural Control Postural Control: Deficits on evaluation (delayed in standing)  Balance Balance Balance Assessed: Yes Static Sitting Balance Static Sitting - Balance Support: Feet supported;Left upper extremity supported Static Sitting - Level of Assistance: 5: Stand by assistance (supervision) Dynamic Sitting Balance Dynamic Sitting - Balance Support: Feet supported;No upper extremity supported Dynamic Sitting - Level of Assistance: 5: Stand by assistance (supervision) Static Standing Balance Static Standing - Balance Support: Left upper extremity supported (HW) Static Standing - Level of Assistance: 5: Stand by assistance (CGA) Dynamic Standing Balance Dynamic Standing -  Balance Support: Left upper extremity supported (HW) Dynamic Standing - Level of Assistance: 4: Min assist Dynamic Standing - Comments: with gait Extremity Assessment  RLE Assessment RLE Assessment: Exceptions to [facility] RLE Strength Right Hip Flexion: 3/5 Right Hip ABduction: 3+/5 Right Hip ADduction: 3+/5 Right Knee Flexion: 3/5 Right Knee Extension: 3+/5  Right Ankle Dorsiflexion: 3+/5 Right Ankle Plantar Flexion: 3+/5 LLE Assessment LLE Assessment: Exceptions to [facility] LLE Strength Left Hip Flexion: 4-/5 Left Hip ABduction: 4-/5 Left Hip ADduction: 4-/5 Left Knee Flexion: 4-/5 Left Knee Extension: 4/5 Left Ankle Dorsiflexion: 4/5 Left Ankle Plantar Flexion: 4/5  Care Tool Care Tool Bed Mobility Roll left and right activity   Roll left and right assist level: Moderate Assistance - Patient 50 - 74%    Sit to lying activity   Sit to lying assist level: Maximal Assistance - Patient 25 - 49%    Lying to sitting on side of bed activity   Lying to sitting on side of bed assist level: the ability to move from lying on the back to sitting on the side of the bed with no back support.: Maximal Assistance - Patient 25 - 49%     Care Tool Transfers Sit to stand transfer   Sit to stand assist level: Moderate Assistance - Patient 50 - 74% (hemi walker)    Chair/bed transfer   Chair/bed transfer assist level: Minimal Assistance - Patient > 75% (hemi-walker)     Toilet transfer   Assist Level: Moderate Assistance - Patient 50 - 74%    Car transfer Car transfer activity did not occur: Safety/medical concerns (pain, fatigue, weakness, decreased balance)        Care Tool Locomotion Ambulation   Assist level: Minimal Assistance - Patient > 75% Assistive device: Walker-hemi Max distance: 31f  Walk 10 feet activity   Assist level: Minimal Assistance - Patient > 75% Assistive device: Walker-hemi   Walk 50 feet with 2 turns activity Walk 50 feet with 2 turns activity did not occur:  Safety/medical concerns (pain, fatigue, weakness, decreased balance)      Walk 150 feet activity Walk 150 feet activity did not occur: Safety/medical concerns (pain, fatigue, weakness, decreased balance)      Walk 10 feet on uneven surfaces activity Walk 10 feet on uneven surfaces activity did not occur: Safety/medical concerns (pain, fatigue, weakness, decreased balance)      Stairs Stair activity did not occur: Safety/medical concerns (pain, fatigue, weakness, decreased balance)        Walk up/down 1 step activity Walk up/down 1 step or curb (drop down) activity did not occur: Safety/medical concerns (pain, fatigue, weakness, decreased balance)      Walk up/down 4 steps activity Walk up/down 4 steps activity did not occur: Safety/medical concerns (pain, fatigue, weakness, decreased balance)      Walk up/down 12 steps activity Walk up/down 12 steps activity did not occur: Safety/medical concerns (pain, fatigue, weakness, decreased balance)      Pick up small objects from floor Pick up small object from the floor (from standing position) activity did not occur: Safety/medical concerns (pain, fatigue, weakness, decreased balance)      Wheelchair Is the patient using a wheelchair?: Yes Type of Wheelchair: Manual Wheelchair activity did not occur: Safety/medical concerns (pain, fatigue, weakness, decreased balance)      Wheel 50 feet with 2 turns activity Wheelchair 50 feet with 2 turns activity did not occur: Safety/medical concerns (pain, fatigue, weakness, decreased balance)    Wheel 150 feet activity Wheelchair 150 feet activity did not occur: Safety/medical concerns (pain, fatigue, weakness, decreased balance)      Refer to Care Plan for Long Term Goals  SHORT TERM GOAL WEEK 1 PT Short Term Goal 1 (Week 1): pt will perform bed mobility with min A PT Short Term Goal 2 (Week 1):  pt will transfer sit<>stand with LRAD and supervision PT Short Term Goal 3 (Week 1): pt will  ambulate 40f with LRAD and supervision  Recommendations for other services: Therapeutic Recreation  Stress management  Skilled Therapeutic Intervention Evaluation completed (see details above and below) with education on PT POC and goals and individual treatment initiated with focus on functional mobility/transfers, generalized strengthening, dynamic standing balance/coordination, ambulation, and improved activity tolerance. Received pt semi-reclined in bed, pt educated on PT evaluation, CIR policies, and therapy schedule and agreeable. Pt reported pain 6/10 in R shoulder. RN notified and present to administer pain medication. Pt upset about current pain medication schedule - notified MD and also emotional/tearful throughout session when discussing current situation due to feeling like she can't count on her son's to assist her and her husband at home. Pt transferred supine<>sitting EOB from flat bed using bedrails with max A and cues for technique. Donned RUE sling with total A and pt transferred sit<>stand from slightly elevated EOB x 2 trials with hemi-walker and mod A. Pt ambulated 174fwith hemi walker and min A. Pt ambulates with decreased cadence, flexed trunk, narrow BOS, downward gaze, decreased weight shifting onto RLE, and with tendency to hold hemi walker in front of her rather than to the L side. Pt requested to return to bed due to fatigue and transferred sit<>supine with max A and sc[facility] to [facility] A and use of Trendelenburg bed position while pulling on headboard with LUE. Concluded session with pt semi-reclined in bed, needs within reach, and bed alarm on. Safety plan updated.   Mobility Bed Mobility Bed Mobility: Supine to Sit Rolling Left: Moderate Assistance - Patient 50-74% Supine to Sit: Maximal Assistance - Patient - Patient 25-49% Sit to Supine: Maximal Assistance - Patient 25-49% Transfers Transfers: Stand Pivot Transfers;Stand to Sit;Sit to Stand Sit to Stand: Moderate  Assistance - Patient 50-74% Stand to Sit: Minimal Assistance - Patient > 75% Transfer (Assistive device): Hemi-walker Locomotion  Gait Ambulation: Yes Gait Assistance: Minimal Assistance - Patient > 75% Gait Distance (Feet): 14 Feet Assistive device: Hemi-walker Gait Assistance Details: Verbal cues for technique;Verbal cues for safe use of DME/AE Gait Assistance Details: verbal cues for proper use and placement of hemi walker and for upright posture/gaze Gait Gait: Yes Gait Pattern: Impaired Gait Pattern: Step-to pattern;Decreased stance time - right;Antalgic;Decreased stride length;Decreased weight shift to right;Decreased step length - right;Decreased step length - left;Trunk flexed;Poor foot clearance - left;Poor foot clearance - right;Narrow base of support Gait velocity: decreased   Discharge Criteria: Patient will be discharged from PT if patient refuses treatment 3 consecutive times without medical reason, if treatment goals not met, if there is a change in medical status, if patient makes no progress towards goals or if patient is discharged from hospital.  The above assessment, treatment plan, treatment alternatives and goals were discussed and mutually agreed upon: by patient  AnAlfonse AlpersT, DPT  10/01/2021, 12:12 PM

## 2021-10-01 NOTE — Progress Notes (Signed)
Inpatient Rehabilitation  Patient information reviewed and entered into eRehab system by Tareek Sabo M. Roscoe Witts, M.A., CCC/SLP, PPS Coordinator.  Information including medical coding, functional ability and quality indicators will be reviewed and updated through discharge.    

## 2021-10-01 NOTE — Progress Notes (Signed)
Patient ID: Andrea Stewart, female   DOB: 12/29/1945, 75 y.o.   MRN: [id number] S:Feels tired today. O:BP 129/69 (BP Location: Left Arm)   Pulse (!) 109   Temp (!) 97.4 F (36.3 C) (Oral)   Resp 17   Wt 85.5 kg   SpO2 100%   BMI 38.07 kg/m   Intake/Output Summary (Last 24 hours) at 10/01/2021 1002 Last data filed at 10/01/2021 0930 Gross per 24 hour  Intake 120 ml  Output 554 ml  Net -434 ml   Intake/Output: I/O last 3 completed shifts: In: 120 [P.O.:120] Out: -   Intake/Output this shift:  Total I/O In: -  Out: 554 [Other:554] Weight change:  Gen:NAD CVS: tachy at 109 Resp:CTA Abd:+BS, soft, NT/ND, PD catheter in place,  Ext:1+ pitting edema BLE  Recent Labs  Lab 09/26/21 0415 09/27/21 0338 09/28/21 0332 09/29/21 0600 09/30/21 0229 10/01/21 0518  NA 130* 129* 128* 130* 128* 126*  K 3.2* 2.9* 3.6 4.0 3.9 4.1  CL 93* 96* 94* 97* 96* 93*  CO2 27 27 27 27 27 26   GLUCOSE 305* 211* 296* 268* 323* 335*  BUN 51* 51* 42* 45* 48* 43*  CREATININE 7.90* 7.15* 7.00* 6.13* 5.84* 5.86*  ALBUMIN  --   --   --   --   --  1.6*  CALCIUM 8.3* 8.0* 8.3* 8.3* 8.3* 8.2*  PHOS  --   --   --   --   --  4.1   Liver Function Tests: Recent Labs  Lab 10/01/21 0518  ALBUMIN 1.6*   No results for input(s): LIPASE, AMYLASE in the last 168 hours. No results for input(s): AMMONIA in the last 168 hours. CBC: Recent Labs  Lab 09/27/21 0338 09/28/21 0332 09/29/21 0600 09/30/21 0229 10/01/21 0518  WBC 6.3 5.8 6.3 7.1 6.0  HGB 9.7* 10.2* 10.3* 9.9* 10.4*  HCT 29.1* 30.5* 31.3* 30.0* 31.9*  MCV 90.4 93.0 92.6 91.5 93.8  PLT 249 269 271 307 321   Cardiac Enzymes: No results for input(s): CKTOTAL, CKMB, CKMBINDEX, TROPONINI in the last 168 hours. CBG: Recent Labs  Lab 09/30/21 1119 09/30/21 1515 09/30/21 1624 09/30/21 2120 10/01/21 0558  GLUCAP 243* 233* 206* 209* 311*    Iron Studies:  Recent Labs    10/01/21 0518  IRON 47  TIBC 206*  FERRITIN 114    Studies/Results: No results found.  ALPRAZolam  0.5 mg Oral QHS   aspirin EC  81 mg Oral BID   atorvastatin  20 mg Oral Q supper   darbepoetin (ARANESP) injection - NON-DIALYSIS  25 mcg Subcutaneous Q Thu-1800   feeding supplement (NEPRO CARB STEADY)  237 mL Oral Q24H   furosemide  80 mg Oral Daily   gentamicin cream  1 application Topical Daily   insulin aspart  0-9 Units Subcutaneous TID WC   insulin aspart  4 Units Subcutaneous TID WC   insulin detemir  12 Units Subcutaneous BID   magnesium oxide  800 mg Oral BID   metoprolol succinate  37.5 mg Oral Q breakfast   mometasone-formoterol  2 puff Inhalation BID   multivitamin  15 mL Oral Daily   pantoprazole  40 mg Oral BID   potassium chloride  40 mEq Oral Daily   spironolactone  12.5 mg Oral Daily   [START ON 10/05/2021] Vitamin D (Ergocalciferol)  50,000 Units Oral Weekly    BMET    Component Value Date/Time   NA 126 (L) 10/01/2021 0518   NA 139 02/20/2020  0000   K 4.1 10/01/2021 0518   CL 93 (L) 10/01/2021 0518   CO2 26 10/01/2021 0518   GLUCOSE 335 (H) 10/01/2021 0518   BUN 43 (H) 10/01/2021 0518   BUN 38 (A) 02/20/2020 0000   CREATININE 5.86 (H) 10/01/2021 0518   CREATININE 1.65 (H) 07/22/2017 1553   CALCIUM 8.2 (L) 10/01/2021 0518   GFRNONAA 7 (L) 10/01/2021 0518   GFRAA 10 (L) 07/18/2020 1118   CBC    Component Value Date/Time   WBC 6.0 10/01/2021 0518   RBC 3.40 (L) 10/01/2021 0518   HGB 10.4 (L) 10/01/2021 0518   HCT 31.9 (L) 10/01/2021 0518   PLT 321 10/01/2021 0518   MCV 93.8 10/01/2021 0518   MCH 30.6 10/01/2021 0518   MCHC 32.6 10/01/2021 0518   RDW 14.6 10/01/2021 0518   LYMPHSABS 2.0 09/21/2021 0225   MONOABS 0.5 09/21/2021 0225   EOSABS 0.0 09/21/2021 0225   BASOSABS 0.1 09/21/2021 0225    Dialysis Orders: Center: [facility]  on CCPD . EDW 73 kg, 5 exchanges, 2 liters fill, fill time 10 min, dwell time 1.5 hrs, drain time 20 minutes.     Assessment/Plan:  S/p right hip and  shoulder fracture - no surgery deemed necessary and now in rehab.  ESRD -  will continue with CCPD at her outpatient prescription.  Hypertension/volume  - weight is up.  Will use all 2.5% due to UF of only 554 ml overnight.  She is 8 kg above her edw.    Anemia  - Hgb trending down.  Will start Aranesp and follow h/h and iron stores.  Metabolic bone disease -  continue with home meds  Nutrition -  renal diet, carb modified.  Hyponatremia - likely due to volume overload.  UF with CCPD as above.  Dm type 2 - poorly controlled with Hgb A1c 9.5%, per primary service.   Donetta Potts, MD [employer] [phone]

## 2021-10-01 NOTE — Progress Notes (Signed)
PROGRESS NOTE   Subjective/Complaints: CBG up to 335: discussed avoiding foods and drinks with added sugars, d/ced feeding supplement, increase Novolog to 5U.  Discussed sodium low  Objective:   No results found. Recent Labs    09/30/21 0229 10/01/21 0518  WBC 7.1 6.0  HGB 9.9* 10.4*  HCT 30.0* 31.9*  PLT 307 321   Recent Labs    09/30/21 0229 10/01/21 0518  NA 128* 126*  K 3.9 4.1  CL 96* 93*  CO2 27 26  GLUCOSE 323* 335*  BUN 48* 43*  CREATININE 5.84* 5.86*  CALCIUM 8.3* 8.2*    Intake/Output Summary (Last 24 hours) at 10/01/2021 1002 Last data filed at 10/01/2021 0930 Gross per 24 hour  Intake 120 ml  Output 554 ml  Net -434 ml        Physical Exam: Vital Signs Blood pressure 129/69, pulse (!) 109, temperature (!) 97.4 F (36.3 C), temperature source Oral, resp. rate 17, weight 85.5 kg, SpO2 100 %.  Gen: no distress, normal appearing HEENT: oral mucosa pink and moist, NCAT Cardio: Tachycardia Chest: normal effort, normal rate of breathing Abd: soft, non-distended Ext: no edema Psych: pleasant, normal affect Skin:    Comments: PD catheter in place  Neurological:     Comments: Patient is alert.  No acute distress.  Provides name and age.  Follows simple commands. Right upper and lower extremity strength pain limited. Strength otherwise intact. MSK: Right upper extremity sling in place   Assessment/Plan: 1. Functional deficits which require 3+ hours per day of interdisciplinary therapy in a comprehensive inpatient rehab setting. Physiatrist is providing close team supervision and 24 hour management of active medical problems listed below. Physiatrist and rehab team continue to assess barriers to discharge/monitor patient progress toward functional and medical goals  Care Tool:  Bathing              Bathing assist       Upper Body Dressing/Undressing Upper body dressing         Upper body assist      Lower Body Dressing/Undressing Lower body dressing            Lower body assist       Toileting Toileting    Toileting assist Assist for toileting: Moderate Assistance - Patient 50 - 74%     Transfers Chair/bed transfer  Transfers assist           Locomotion Ambulation   Ambulation assist              Walk 10 feet activity   Assist           Walk 50 feet activity   Assist           Walk 150 feet activity   Assist           Walk 10 feet on uneven surface  activity   Assist           Wheelchair     Assist               Wheelchair 50 feet with 2 turns activity    Assist  Wheelchair 150 feet activity     Assist          Blood pressure 129/69, pulse (!) 109, temperature (!) 97.4 F (36.3 C), temperature source Oral, resp. rate 17, weight 85.5 kg, SpO2 100 %.    Medical Problem List and Plan: 1.  Nondisplaced right hip greater trochanteric fracture as well as right nondisplaced fracture of the lateral humeral head secondary to fall.  Nonoperative.  Follow-up Dr. Earnestine Leys.  Weightbearing as tolerated.  Shoulder sling for comfort.             -patient may shower             -ELOS/Goals: 7-10 days modI             -check vitamin D level today 2.  Antithrombotics: -DVT/anticoagulation:  Mechanical:  Antiembolism stockings, knee (TED hose) Bilateral lower extremities.  Check vascular study, has not yet resulted             -antiplatelet therapy: N/A 3. Postoperative pain: Increase Oxycodone to q4H as needed 4. Anxiety: Continue Xanax 0.5 mg nightly, Vistaril 25 mg twice daily as needed, trazodone as needed             -antipsychotic agents: N/A 5. Neuropsych: This patient is capable of making decisions on her own behalf. 6. Skin/Wound Care: Routine skin checks 7. Fluids/Electrolytes/Nutrition: Routine in and outs with follow-up chemistries 8.  End-stage renal  disease.  Continue peritoneal dialysis 9.  Anemia of chronic disease.  Follow-up CBC 10.  Hypertension, but currently soft. Given increase in metoprolol for heart rate, will decrease Aldactone to 12.5 mg daily, Toprol-XL 25 mg daily, Lasix 80 mg daily.  Monitor with increased mobility 11.  Diabetes mellitus.  Hemoglobin A1c 9.7.  increase NovoLog to 5 units 3 times daily, Levemir 12 units twice daily.  Check blood sugars before meals and at bedtime 12.  Hyperlipidemia.  Lipitor 13.  COPD with tobacco abuse.  Continue nebulizers as directed.  Check oxygen saturations every shift.  Provide counsel guards to cessation of nicotine products. 14.  Obesity.  BMI 39.27.  Dietary follow-up 15. Tachycardia: increase lopressor to 37.5mg  16. Hypomagnesemia: will be managed by nephrology while she is inpatient 17. Hyponatremia: likely secondary to volume overload as per nephrology  LOS: 1 days A FACE TO FACE EVALUATION WAS [place] 10/01/2021, 10:02 AM

## 2021-10-02 DIAGNOSIS — S72001A Fracture of unspecified part of neck of right femur, initial encounter for closed fracture: Secondary | ICD-10-CM | POA: Diagnosis not present

## 2021-10-02 LAB — GLUCOSE, CAPILLARY
Glucose-Capillary: 431 mg/dL — ABNORMAL HIGH (ref 70–99)
Glucose-Capillary: 435 mg/dL — ABNORMAL HIGH (ref 70–99)
Glucose-Capillary: 391 mg/dL — ABNORMAL HIGH (ref 70–99)
Glucose-Capillary: 248 mg/dL — ABNORMAL HIGH (ref 70–99)
Glucose-Capillary: 57 mg/dL — ABNORMAL LOW (ref 70–99)
Glucose-Capillary: 115 mg/dL — ABNORMAL HIGH (ref 70–99)
Glucose-Capillary: 290 mg/dL — ABNORMAL HIGH (ref 70–99)

## 2021-10-02 MED ORDER — INSULIN ASPART 100 UNIT/ML IJ SOLN
9.0000 [IU] | Freq: Once | INTRAMUSCULAR | Status: AC
  Administered 2021-10-02: 9 [IU] via SUBCUTANEOUS

## 2021-10-02 MED ORDER — GENTAMICIN SULFATE 0.1 % EX CREA: 1.0000 "application " | TOPICAL_CREAM | Freq: Every day | CUTANEOUS | Status: DC

## 2021-10-02 MED ORDER — ALUM & MAG HYDROXIDE-SIMETH 200-200-20 MG/5ML PO SUSP
15.0000 mL | ORAL | Status: DC | PRN
  Administered 2021-10-02: 15 mL via ORAL
  Filled 2021-10-02: qty 30

## 2021-10-02 MED ORDER — HEPARIN 1000 UNIT/ML FOR PERITONEAL DIALYSIS: 500.0000 [IU] | INTRAMUSCULAR | Status: DC | PRN

## 2021-10-02 MED ORDER — DARBEPOETIN ALFA 25 MCG/0.42ML IJ SOSY
25.0000 ug | PREFILLED_SYRINGE | INTRAMUSCULAR | Status: DC
  Administered 2021-10-02 – 2021-10-15 (×3): 25 ug via SUBCUTANEOUS
  Filled 2021-10-02 (×4): qty 0.42

## 2021-10-02 MED ORDER — INSULIN ASPART 100 UNIT/ML IJ SOLN
6.0000 [IU] | Freq: Three times a day (TID) | INTRAMUSCULAR | Status: DC
  Administered 2021-10-03 – 2021-10-05 (×6): 6 [IU] via SUBCUTANEOUS

## 2021-10-02 MED ORDER — AMITRIPTYLINE HCL 10 MG PO TABS
10.0000 mg | ORAL_TABLET | Freq: Every day | ORAL | Status: DC
  Administered 2021-10-02 – 2021-10-15 (×14): 10 mg via ORAL
  Filled 2021-10-02 (×16): qty 1

## 2021-10-02 MED ORDER — DELFLEX-LC/2.5% DEXTROSE 394 MOSM/L IP SOLN: Freq: Every day | INTRAPERITONEAL | Status: DC

## 2021-10-02 NOTE — Progress Notes (Signed)
Peritoneal Dialysis completed overnight. Per Renal nurse patient removed 581 CC. No complications or adverse reactions noted during procedure.

## 2021-10-02 NOTE — Progress Notes (Signed)
Patient ID: Andrea Stewart, female   DOB: [birth year], 75 y.o.   MRN: [id number] Met with the patient to introduce self and role. Reviewed current medical status, rehab routine and plan of care. (Patient noted she was told LOS was 3-4 weeks) Reviewed team conference schedule and discharge date would be confirmed in team conference. Also reviewed previous hip fx, and current event. Reviewed secondary risks including HTN, HLD, DM (A1C 9.7) and smoking. Patient noted CBGs were high and hard to manage. Reported spouse would get food from restaurants. Discussed dietary modifications, increased veg and decreased starchy/sugary foods. Reported spouse also smokes which made cessation difficult and had already looked into patches and was aware of the need not to smoke and wear the patch. Reported not ready to quit yet.  Patient uses insulin from a vial at home; difficult to administer to draw up and administer to self with sling. Educational handouts provided and nursing to review with family for discharge. Continue to follow along to discharge to address educational needs and collaborate with the team to facilitate preparation for discharge home. Margarito Liner

## 2021-10-02 NOTE — Progress Notes (Signed)
Physical Therapy Session Note  Patient Details  Name: Andrea Stewart MRN: [id number] Date of Birth: 1946-06-19  Today's Date: 10/02/2021 PT Individual Time: 1300-1341 PT Individual Time Calculation (min): 41 min   Today's Date: 10/02/2021 PT Missed Time: 34 Minutes Missed Time Reason: Patient fatigue;Pain  Short Term Goals: Week 1:  PT Short Term Goal 1 (Week 1): pt will perform bed mobility with min A PT Short Term Goal 2 (Week 1): pt will transfer sit<>stand with LRAD and supervision PT Short Term Goal 3 (Week 1): pt will ambulate 69ft with LRAD and supervision  Skilled Therapeutic Interventions/Progress Updates:   Received pt sitting in [facility] requesting to lie back down. With encouragement pt agreeable to PT treatment and did not c/o of pain at rest, just "sleepiness" but pain quickly increased to 8/10 in R shoulder with movement (premedicated). Repositioning, rest breaks, and distraction done to reduce pain levels. Session with emphasis on functional mobility, simulated car transfer, and improved activity tolerance. Pt transported to/from room in [facility] total A for time management purposes. Attempted to perform simulated car transfer. Pt transferred sit<>stand from [facility] with hemi-walker and mod A and ambulated 47ft with hemi-walker and min A to car - required significantly increased time and cues for turning technique/AD management. Pt fearful of falling stating twice during session "please don't let me fall". Pt required extensive rest break upon sitting down in car and began moaning and grimacing in pain. Pt ultimately unable to get LE's into car and could not tolerance activity - will attempt again on another date. Therefore, transferred car<>WC stand<>pivot with hemi-walker and min A (mod A to stand from car seat). Pt unable to scoot hips back in [facility] requiring total A. Pt c/o increased fatigue and pain and began whining, requesting to return to bed. Stand<>pivot WC<>bed with hemi-walker with  min/mod A in same manner with increased time and cues for stepping technique. Doffed shoes sitting EOB with total A and transferred sit<>supine with total A. Supported RUE on pillow and concluded session with pt semi-reclined in bed, needs within reach, and bed alarm on. 34 minutes missed of skilled physical therapy due to fatigue and pain.   Therapy Documentation Precautions:  Precautions Precautions: None Precaution Comments: WBAT on RLE and RUE, RUE sling for comfort Required Braces or Orthoses: Sling Restrictions Weight Bearing Restrictions: Yes RUE Weight Bearing: Weight bearing as tolerated LUE Weight Bearing: Weight bearing as tolerated RLE Weight Bearing: Weight bearing as tolerated LLE Weight Bearing: Weight bearing as tolerated Other Position/Activity Restrictions: RUE sling for comfort   Therapy/Group: Individual Therapy Alfonse Alpers PT, DPT   10/02/2021, 7:40 AM

## 2021-10-02 NOTE — IPOC Note (Signed)
Overall Plan of Care [facility]) Patient Details Name: EMANI TAUSSIG MRN: [id number] DOB: 08/07/1946  Admitting Diagnosis: Closed right hip fracture [facility])  Hospital Problems: Principal Problem:   Closed right hip fracture (HCC) Active Problems:   Intertrochanteric fracture of right hip ([place])     Functional Problem List: Nursing Bowel, Bladder, Medication Management, Safety, Pain, Endurance  PT Balance, Edema, Endurance, Motor, Nutrition, Pain, Skin Integrity  OT Balance, Behavior, Edema, Endurance, Motor, Nutrition, Pain, Perception, Skin Integrity, Sensory, Safety  SLP    TR         Basic ADL's: OT Eating, Grooming, Bathing, Dressing, Toileting     Advanced  ADL's: OT       Transfers: PT Bed Mobility, Bed to Chair, Car, Patent attorney, Agricultural engineer: PT Ambulation, Emergency planning/management officer, Stairs     Additional Impairments: OT Fuctional Use of Upper Extremity  SLP        TR      Anticipated Outcomes Item Anticipated Outcome  Self Feeding Mod I  Swallowing      Basic self-care  Supervision/CGA  Toileting  CGA   Bathroom Transfers Supervision  Bowel/Bladder  manage bowel w mod I and bladder with toileting protocol  Transfers  mod I with LRAD  Locomotion  mod I with LRAD  Communication     Cognition     Pain  at or below level 4 w prns  Safety/Judgment  maintain safety w cues   Therapy Plan: PT Intensity: Minimum of 1-2 x/day ,45 to 90 minutes PT Frequency: 5 out of 7 days PT Duration Estimated Length of Stay: 10-12 days OT Intensity: Minimum of 1-2 x/day, 45 to 90 minutes OT Frequency: 5 out of 7 days OT Duration/Estimated Length of Stay: 12-14 days     Due to the current state of emergency, patients may not be receiving their 3-hours of Medicare-mandated therapy.   Team Interventions: Nursing Interventions Patient/Family Education, Bowel Management, Pain Management, Discharge Planning, Medication Management, Disease  Management/Prevention, Bladder Management  PT interventions Ambulation/gait training, Discharge planning, Functional mobility training, Psychosocial support, Therapeutic Activities, Balance/vestibular training, Disease management/prevention, Neuromuscular re-education, Skin care/wound management, Therapeutic Exercise, Wheelchair propulsion/positioning, DME/adaptive equipment instruction, Pain management, Splinting/orthotics, UE/LE Strength taining/ROM, Community reintegration, Barrister's clerk education, IT trainer, UE/LE Coordination activities  OT Interventions Training and development officer, Cognitive remediation/compensation, Academic librarian, Discharge planning, Disease mangement/prevention, Engineer, drilling, Functional electrical stimulation, Functional mobility training, Neuromuscular re-education, Pain management, Patient/family education, Psychosocial support, Skin care/wound managment, Splinting/orthotics, Therapeutic Activities, Self Care/advanced ADL retraining, UE/LE Coordination activities, Visual/perceptual remediation/compensation, Therapeutic Exercise, UE/LE Strength taining/ROM, Wheelchair propulsion/positioning  SLP Interventions    TR Interventions    SW/CM Interventions Discharge Planning, Psychosocial Support, Patient/Family Education, Disease Management/Prevention   Barriers to Discharge MD  Medical stability  Nursing Other (comments), Decreased caregiver support (Peritoneal Dialysis) 1 level ramped entry w spouse; has DME, PD per spouse and med management and cooking  PT Decreased caregiver support, Wound Care, Lack of/limited family support, Weight bearing restrictions pain, husband is currently a patient in CIR and has intermittent assist from sons at home  OT Decreased caregiver support [place] for SNF coverage, Decreased caregiver support     Team Discharge Planning: Destination: PT-Home ,OT- Home , SLP-  Projected  Follow-up: PT-Home health PT, OT-  Home health OT, SLP-  Projected Equipment Needs: PT-To be determined, OT- To be determined, SLP-  Equipment Details: PT-has rollator and transport chair, OT-  Patient/family involved in discharge planning: PT- Patient,  OT-Patient, SLP-   MD ELOS: 7-10 days modI Medical Rehab Prognosis:  Excellent Assessment: Mrs. Nanez is a 75 year old woman admitted to CIR with a nondisplaced right hip greater trochanteric fracture as well as right nondisplaced fracture of the lateral humeral head secondary to fall. Medications are being managed, and labs and vitals are being monitored regularly.     See Team Conference Notes for weekly updates to the plan of care

## 2021-10-02 NOTE — Progress Notes (Signed)
PROGRESS NOTE   Subjective/Complaints: CBGs uncontrolled to 400s- increased Novolog to 6U and provided dietary education She currently does not have much appetite for lunch She has been sleeping poorly at night  ROS: +insomnia  Objective:   No results found. Recent Labs    09/30/21 0229 10/01/21 0518  WBC 7.1 6.0  HGB 9.9* 10.4*  HCT 30.0* 31.9*  PLT 307 321   Recent Labs    09/30/21 0229 10/01/21 0518  NA 128* 126*  K 3.9 4.1  CL 96* 93*  CO2 27 26  GLUCOSE 323* 335*  BUN 48* 43*  CREATININE 5.84* 5.86*  CALCIUM 8.3* 8.2*    Intake/Output Summary (Last 24 hours) at 10/02/2021 1517 Last data filed at 10/02/2021 0525 Gross per 24 hour  Intake 10373 ml  Output 10594 ml  Net -221 ml        Physical Exam: Vital Signs Blood pressure 125/71, pulse 78, temperature 98.1 F (36.7 C), temperature source Oral, resp. rate 18, weight 90 kg, SpO2 95 %.  Gen: no distress, normal appearing, BMI 40.07 HEENT: oral mucosa pink and moist, NCAT Cardio: Tachycardia Chest: normal effort, normal rate of breathing Abd: soft, non-distended Ext: no edema Psych: pleasant, normal affect Skin:    Comments: PD catheter in place  Neurological:     Comments: Patient is alert.  No acute distress.  Provides name and age.  Follows simple commands. Right upper and lower extremity strength pain limited. Strength otherwise intact. MSK: Right upper extremity sling in place   Assessment/Plan: 1. Functional deficits which require 3+ hours per day of interdisciplinary therapy in a comprehensive inpatient rehab setting. Physiatrist is providing close team supervision and 24 hour management of active medical problems listed below. Physiatrist and rehab team continue to assess barriers to discharge/monitor patient progress toward functional and medical goals  Care Tool:  Bathing    Body parts bathed by patient: Right arm, Chest,  Abdomen, Right upper leg, Left upper leg, Right lower leg, Left lower leg, Face   Body parts bathed by helper: Right arm, Buttocks, Front perineal area     Bathing assist Assist Level: Moderate Assistance - Patient 50 - 74%     Upper Body Dressing/Undressing Upper body dressing   What is the patient wearing?: Pull over shirt    Upper body assist Assist Level: Maximal Assistance - Patient 25 - 49%    Lower Body Dressing/Undressing Lower body dressing      What is the patient wearing?: Pants     Lower body assist Assist for lower body dressing: Total Assistance - Patient < 25%     Toileting Toileting    Toileting assist Assist for toileting: Total Assistance - Patient < 25%     Transfers Chair/bed transfer  Transfers assist     Chair/bed transfer assist level: Minimal Assistance - Patient > 75%     Locomotion Ambulation   Ambulation assist      Assist level: Minimal Assistance - Patient > 75% Assistive device: Walker-hemi Max distance: 85ft   Walk 10 feet activity   Assist     Assist level: Minimal Assistance - Patient > 75% Assistive device: Walker-hemi  Walk 50 feet activity   Assist Walk 50 feet with 2 turns activity did not occur: Safety/medical concerns (pain, fatigue, weakness, decreased balance)         Walk 150 feet activity   Assist Walk 150 feet activity did not occur: Safety/medical concerns (pain, fatigue, weakness, decreased balance)         Walk 10 feet on uneven surface  activity   Assist Walk 10 feet on uneven surfaces activity did not occur: Safety/medical concerns (pain, fatigue, weakness, decreased balance)         Wheelchair     Assist Is the patient using a wheelchair?: Yes Type of Wheelchair: Manual Wheelchair activity did not occur: Safety/medical concerns (pain, fatigue, weakness, decreased balance)         Wheelchair 50 feet with 2 turns activity    Assist    Wheelchair 50 feet with 2  turns activity did not occur: Safety/medical concerns (pain, fatigue, weakness, decreased balance)       Wheelchair 150 feet activity     Assist  Wheelchair 150 feet activity did not occur: Safety/medical concerns (pain, fatigue, weakness, decreased balance)       Blood pressure 125/71, pulse 78, temperature 98.1 F (36.7 C), temperature source Oral, resp. rate 18, weight 90 kg, SpO2 95 %.    Medical Problem List and Plan: 1.  Nondisplaced right hip greater trochanteric fracture as well as right nondisplaced fracture of the lateral humeral head secondary to fall.  Nonoperative.  Follow-up Dr. Earnestine Leys.  Weightbearing as tolerated.  Shoulder sling for comfort.             -patient may shower             -ELOS/Goals: 7-10 days modI  Continue CIR 2.  Antithrombotics: -DVT/anticoagulation:  Mechanical:  Antiembolism stockings, knee (TED hose) Bilateral lower extremities.  Check vascular study, has not yet resulted             -antiplatelet therapy: N/A 3. Postoperative pain: Increase Oxycodone to q4H as needed 4. Anxiety: Continue Xanax 0.5 mg nightly, Vistaril 25 mg twice daily as needed, trazodone as needed             -antipsychotic agents: N/A 5. Neuropsych: This patient is capable of making decisions on her own behalf. 6. Skin/Wound Care: Routine skin checks 7. Fluids/Electrolytes/Nutrition: Routine in and outs with follow-up chemistries 8.  End-stage renal disease.  Continue peritoneal dialysis 9.  Anemia of chronic disease.  Follow-up CBC 10.  Hypertension, but currently soft. Given increase in metoprolol for heart rate, will decrease Aldactone to 12.5 mg daily, Toprol-XL 25 mg daily, Lasix 80 mg daily.  Monitor with increased mobility 11.  Diabetes mellitus.  Hemoglobin A1c 9.7. increase NovoLog to 6 units 3 times daily, Levemir 12 units twice daily.  Check blood sugars before meals and at bedtime 12.  Hyperlipidemia.  Lipitor 13.  COPD with tobacco abuse.  Continue  nebulizers as directed.  Check oxygen saturations every shift.  Provide counsel guards to cessation of nicotine products. 14.  Obesity.  BMI 40.07  Dietary follow-up 15. Tachycardia: increase lopressor to 37.5mg - resolved, continue to monitor TID 16. Hypomagnesemia: will be managed by nephrology while she is inpatient 17. Hyponatremia: likely secondary to volume overload as per nephrology 18. Insomnia: start Amitriptyline 10mg  HS. Schedule outpatient sleep study  LOS: 2 days A FACE TO FACE EVALUATION WAS PERFORMED  Martha Clan P Mal[place]ki [place] 10/02/2021, 3:17 PM

## 2021-10-02 NOTE — Progress Notes (Signed)
Patient 0630 CBG 435, repeat check of 431. Notified provider on site. One time order to administer 9 units of Aspart for CBG of 431. With plan to recheck in 1 hour. Will notify oncoming staff.

## 2021-10-02 NOTE — Progress Notes (Signed)
Patient ID: Andrea Stewart, female   DOB: [birth year], 75 y.o.   MRN: [id number] S:No new complaints O:BP 125/71 (BP Location: Left Arm)   Pulse 78   Temp 98.1 F (36.7 C) (Oral)   Resp 18   Wt 90 kg   SpO2 95%   BMI 40.07 kg/m   Intake/Output Summary (Last 24 hours) at 10/02/2021 1059 Last data filed at 10/02/2021 0525 Gross per 24 hour  Intake 10493 ml  Output 10594 ml  Net -101 ml   Intake/Output: I/O last 3 completed shifts: In: 63893 [P.O.:600; TDSKA:76811] Out: 57262 [MBTDH:74163]  Intake/Output this shift:  No intake/output data recorded. Weight change: 1.4 kg Gen: NAD CVS: RRR Resp: CTA Abd: +Bs, soft, NT/ND Ext: 1+ pretibial edema bilaterally   Recent Labs  Lab 09/26/21 0415 09/27/21 0338 09/28/21 0332 09/29/21 0600 09/30/21 0229 10/01/21 0518  NA 130* 129* 128* 130* 128* 126*  K 3.2* 2.9* 3.6 4.0 3.9 4.1  CL 93* 96* 94* 97* 96* 93*  CO2 27 27 27 27 27 26   GLUCOSE 305* 211* 296* 268* 323* 335*  BUN 51* 51* 42* 45* 48* 43*  CREATININE 7.90* 7.15* 7.00* 6.13* 5.84* 5.86*  ALBUMIN  --   --   --   --   --  1.6*  CALCIUM 8.3* 8.0* 8.3* 8.3* 8.3* 8.2*  PHOS  --   --   --   --   --  4.1   Liver Function Tests: Recent Labs  Lab 10/01/21 0518  ALBUMIN 1.6*   No results for input(s): LIPASE, AMYLASE in the last 168 hours. No results for input(s): AMMONIA in the last 168 hours. CBC: Recent Labs  Lab 09/27/21 0338 09/28/21 0332 09/29/21 0600 09/30/21 0229 10/01/21 0518  WBC 6.3 5.8 6.3 7.1 6.0  HGB 9.7* 10.2* 10.3* 9.9* 10.4*  HCT 29.1* 30.5* 31.3* 30.0* 31.9*  MCV 90.4 93.0 92.6 91.5 93.8  PLT 249 269 271 307 321   Cardiac Enzymes: No results for input(s): CKTOTAL, CKMB, CKMBINDEX, TROPONINI in the last 168 hours. CBG: Recent Labs  Lab 10/01/21 1649 10/01/21 2115 10/02/21 0641 10/02/21 0647 10/02/21 0807  GLUCAP 159* 350* 435* 431* 391*    Iron Studies:  Recent Labs    10/01/21 0518  IRON 47  TIBC 206*  FERRITIN 114    Studies/Results: No results found.  ALPRAZolam  0.5 mg Oral QHS   aspirin EC  81 mg Oral BID   atorvastatin  20 mg Oral Q supper   darbepoetin (ARANESP) injection - NON-DIALYSIS  25 mcg Subcutaneous Q Thu-1800   feeding supplement (NEPRO CARB STEADY)  237 mL Oral Q24H   furosemide  80 mg Oral Daily   gentamicin cream  1 application Topical Daily   insulin aspart  0-9 Units Subcutaneous TID WC   insulin aspart  5 Units Subcutaneous TID WC   insulin detemir  12 Units Subcutaneous BID   magnesium oxide  800 mg Oral BID   metoprolol succinate  37.5 mg Oral Q breakfast   mometasone-formoterol  2 puff Inhalation BID   multivitamin  15 mL Oral Daily   pantoprazole  40 mg Oral BID   potassium chloride  40 mEq Oral Daily   spironolactone  12.5 mg Oral Daily   [START ON 10/05/2021] Vitamin D (Ergocalciferol)  50,000 Units Oral Weekly    BMET    Component Value Date/Time   NA 126 (L) 10/01/2021 0518   NA 139 02/20/2020 0000   K 4.1 10/01/2021  0518   CL 93 (L) 10/01/2021 0518   CO2 26 10/01/2021 0518   GLUCOSE 335 (H) 10/01/2021 0518   BUN 43 (H) 10/01/2021 0518   BUN 38 (A) 02/20/2020 0000   CREATININE 5.86 (H) 10/01/2021 0518   CREATININE 1.65 (H) 07/22/2017 1553   CALCIUM 8.2 (L) 10/01/2021 0518   GFRNONAA 7 (L) 10/01/2021 0518   GFRAA 10 (L) 07/18/2020 1118   CBC    Component Value Date/Time   WBC 6.0 10/01/2021 0518   RBC 3.40 (L) 10/01/2021 0518   HGB 10.4 (L) 10/01/2021 0518   HCT 31.9 (L) 10/01/2021 0518   PLT 321 10/01/2021 0518   MCV 93.8 10/01/2021 0518   MCH 30.6 10/01/2021 0518   MCHC 32.6 10/01/2021 0518   RDW 14.6 10/01/2021 0518   LYMPHSABS 2.0 09/21/2021 0225   MONOABS 0.5 09/21/2021 0225   EOSABS 0.0 09/21/2021 0225   BASOSABS 0.1 09/21/2021 0225    Dialysis Orders: Center: [facility]  on [facility] . EDW 73 kg, 5 exchanges, 2 liters fill, fill time 10 min, dwell time 1.5 hrs, drain time 20 minutes.     Assessment/Plan:  S/p right hip and  shoulder fracture - no surgery deemed necessary and now in rehab.  ESRD -  will continue with [facility] at her outpatient prescription.  Hypertension/volume  - weight is up.  Will use all 2.5% due to UF of only 554 ml with combination of 1.5%.  She is 8 kg above her edw and may need to add 4.25% to help with UF, however blood sugars were elevated overnight.    Anemia  - Hgb trending down.  Will start Aranesp and follow h/h and iron stores.  Metabolic bone disease -  continue with home meds  Nutrition -  renal diet, carb modified.  Hyponatremia - likely due to volume overload.  UF with [facility] as above.  Dm type 2 - poorly controlled with Hgb A1c 9.5%, per primary service.   Donetta Potts, MD [employer] [phone]

## 2021-10-02 NOTE — Progress Notes (Signed)
Occupational Therapy Session Note  Patient Details  Name: Andrea Stewart MRN: [id number] Date of Birth: 29-Apr-1946  Today's Date: 10/02/2021 OT Individual Time: 4707-6151 OT Individual Time Calculation (min): 71 min    Short Term Goals: Week 1:  OT Short Term Goal 1 (Week 1): Patient will use adaptive equipment for LB ADLs with mod A OT Short Term Goal 2 (Week 1): Patient will complete 1 step of toileting task OT Short Term Goal 3 (Week 1): Patient will maintain standing balance for 2 minutes in preparation for BADL Task  Skilled Therapeutic Interventions/Progress Updates:  Skilled OT intervention completed with focus on ADL retraining. Pt received supine in bed, agreeable to session. Requested assist to toilet. Bed mobility supine to EOB with Max A for trunk and BLE management. Pt completed sit > stand from bed elevated using hemi-walker in L hand with mod A, then stand pivot to [facility] with mod-max A with pt presenting forward trunk flexion throughout. Cues required through transfers for walker positioning, BLE foot placement and safety cues. Total A required for pericare, then stand pivot to w/c with mod A using hemi-walker. Self-care completed at sink. Required min A to doff gown, able to wash UB with min A due to pain in R shoulder with sling off. Required mod A for donning long sleeve shirt, with education provided about donning weaker UE first. Pt able to thread over arm, but presented very guarded in RUE, with resistant to moving arm from waistline. Educated about increased pain that could come from pulling on R hand versus supporting beneath elbow when sling is doffed. Pain improved when using LUE to prop underneath R elbow. Donned sling for comfort with total A, pt denying participation in assisting. Able to wash BLEs, using forward lean, with Max A required for LB clothing management. Pt left seated in w/c, in a position of comfort, with chair alarm on, set up for breakfast with all needs  in reach at end of session.  Therapy Documentation Precautions:  Precautions Precautions: None Precaution Comments: WBAT on RLE and RUE, RUE sling for comfort Required Braces or Orthoses: Sling Restrictions Weight Bearing Restrictions: Yes RUE Weight Bearing: Weight bearing as tolerated LUE Weight Bearing: Weight bearing as tolerated RLE Weight Bearing: Weight bearing as tolerated LLE Weight Bearing: Weight bearing as tolerated Other Position/Activity Restrictions: RUE sling for comfort  Pain: 6/10 pain, RN aware but pt not due for meds. Provided position of comfort with sling and support during ADLs without sling.   Therapy/Group: Individual Therapy  Annalisa Colonna E Kaye Mitro 10/02/2021, 7:40 AM

## 2021-10-03 DIAGNOSIS — G479 Sleep disorder, unspecified: Secondary | ICD-10-CM | POA: Diagnosis not present

## 2021-10-03 DIAGNOSIS — R7309 Other abnormal glucose: Secondary | ICD-10-CM

## 2021-10-03 DIAGNOSIS — D638 Anemia in other chronic diseases classified elsewhere: Secondary | ICD-10-CM | POA: Diagnosis not present

## 2021-10-03 DIAGNOSIS — S72001D Fracture of unspecified part of neck of right femur, subsequent encounter for closed fracture with routine healing: Secondary | ICD-10-CM | POA: Diagnosis not present

## 2021-10-03 DIAGNOSIS — M25511 Pain in right shoulder: Secondary | ICD-10-CM | POA: Diagnosis not present

## 2021-10-03 DIAGNOSIS — R609 Edema, unspecified: Secondary | ICD-10-CM | POA: Diagnosis not present

## 2021-10-03 LAB — CBC
HCT: 32.1 % — ABNORMAL LOW (ref 36.0–46.0)
Hemoglobin: 10.1 g/dL — ABNORMAL LOW (ref 12.0–15.0)
MCH: 30.1 pg (ref 26.0–34.0)
MCHC: 31.5 g/dL (ref 30.0–36.0)
MCV: 95.8 fL (ref 80.0–100.0)
Platelets: 328 10*3/uL (ref 150–400)
RBC: 3.35 MIL/uL — ABNORMAL LOW (ref 3.87–5.11)
RDW: 14.4 % (ref 11.5–15.5)
WBC: 5.2 10*3/uL (ref 4.0–10.5)
nRBC: 0 % (ref 0.0–0.2)

## 2021-10-03 LAB — RENAL FUNCTION PANEL
Albumin: 1.6 g/dL — ABNORMAL LOW (ref 3.5–5.0)
Anion gap: 7 (ref 5–15)
BUN: 37 mg/dL — ABNORMAL HIGH (ref 8–23)
CO2: 27 mmol/L (ref 22–32)
Calcium: 8.1 mg/dL — ABNORMAL LOW (ref 8.9–10.3)
Chloride: 93 mmol/L — ABNORMAL LOW (ref 98–111)
Creatinine, Ser: 5.52 mg/dL — ABNORMAL HIGH (ref 0.44–1.00)
GFR, Estimated: 8 mL/min — ABNORMAL LOW (ref >60)
Glucose, Bld: 470 mg/dL — ABNORMAL HIGH (ref 70–99)
Phosphorus: 3.6 mg/dL (ref 2.5–4.6)
Potassium: 4.9 mmol/L (ref 3.5–5.1)
Sodium: 127 mmol/L — ABNORMAL LOW (ref 135–145)

## 2021-10-03 LAB — GLUCOSE, CAPILLARY
Glucose-Capillary: 340 mg/dL — ABNORMAL HIGH (ref 70–99)
Glucose-Capillary: 419 mg/dL — ABNORMAL HIGH (ref 70–99)
Glucose-Capillary: 187 mg/dL — ABNORMAL HIGH (ref 70–99)
Glucose-Capillary: 123 mg/dL — ABNORMAL HIGH (ref 70–99)
Glucose-Capillary: 171 mg/dL — ABNORMAL HIGH (ref 70–99)

## 2021-10-03 MED ORDER — OXYCODONE HCL 5 MG PO TABS
5.0000 mg | ORAL_TABLET | Freq: Four times a day (QID) | ORAL | Status: DC
  Administered 2021-10-03 – 2021-10-10 (×25): 5 mg via ORAL
  Filled 2021-10-03 (×26): qty 1

## 2021-10-03 MED ORDER — GENTAMICIN SULFATE 0.1 % EX CREA
1.0000 "application " | TOPICAL_CREAM | Freq: Every day | CUTANEOUS | Status: DC
  Filled 2021-10-03: qty 15

## 2021-10-03 MED ORDER — DELFLEX-LC/2.5% DEXTROSE 394 MOSM/L IP SOLN: Freq: Every day | INTRAPERITONEAL | Status: DC

## 2021-10-03 MED ORDER — ADULT MULTIVITAMIN W/MINERALS CH
1.0000 | ORAL_TABLET | Freq: Every day | ORAL | Status: DC
  Administered 2021-10-04 – 2021-10-16 (×13): 1 via ORAL
  Filled 2021-10-03 (×13): qty 1

## 2021-10-03 MED ORDER — INSULIN ASPART 100 UNIT/ML IJ SOLN
9.0000 [IU] | Freq: Once | INTRAMUSCULAR | Status: AC
Start: 2021-10-03 — End: 2021-10-03
  Administered 2021-10-03: 9 [IU] via SUBCUTANEOUS

## 2021-10-03 MED ORDER — DELFLEX-LC/4.25% DEXTROSE 483 MOSM/L IP SOLN: Freq: Every day | INTRAPERITONEAL | Status: DC

## 2021-10-03 MED ORDER — HEPARIN 1000 UNIT/ML FOR PERITONEAL DIALYSIS
500.0000 [IU] | INTRAMUSCULAR | Status: DC | PRN
  Filled 2021-10-03: qty 0.5

## 2021-10-03 NOTE — Progress Notes (Signed)
Pt requested scheduled pain meds because they forget to ask for PRN. MD made aware

## 2021-10-03 NOTE — Progress Notes (Signed)
Pt c/o nausea. Ginger ale given for relief. Will continue to monitor

## 2021-10-03 NOTE — Progress Notes (Signed)
Occupational Therapy Session Note  Patient Details  Name: Andrea Stewart MRN: [id number] Date of Birth: 1946/09/28  Today's Date: 10/03/2021 OT Individual Time: 0867-6195 OT Individual Time Calculation (min): 56 min    Short Term Goals: Week 1:  OT Short Term Goal 1 (Week 1): Patient will use adaptive equipment for LB ADLs with mod A OT Short Term Goal 2 (Week 1): Patient will complete 1 step of toileting task OT Short Term Goal 3 (Week 1): Patient will maintain standing balance for 2 minutes in preparation for BADL Task  Skilled Therapeutic Interventions/Progress Updates:  Skilled OT intervention completed with focus on ADL retraining, sleep positioning education, and PROM of R shoulder. Pt received seated in w/c, agreeable to session, with LPN in room. Pt reporting lack of sleep last night due to pain and inability to get comfortable with back sleeping vs preferred side sleeping. Educated on positioning for sidelying to support RUE/decrease pain. Opted to wash hair this session vs full shower. Pt remained in w/c for seated hair washing using shower tray, with pt able to use LUE during wash and rinse cycle, as well as during towel drying. Pt guarded RUE throughout task and was encouraged to hold hair dryer or brush in R hand and bend at elbow with sling doffed, with therapist support at elbow however pt with expressed pain and intolerance with movement. Required cues to use functional LUE throughout task to promote UE strength and endurance needed for ADL management. MD in for AM rounds. With sling off, encouraged PROM while seated in w/c, with pt walking fingertips to knee, then back to thigh. Pt with increased time and minimal pain, able to complete however still presenting very guarded. Educated on importance of completing ROM throughout day as well as resting arm without wearing sling or holding across arm stomach, as these methods could contribute to lack of future AROM/function and  increased pain. Pt receptive, however still requested support with pillow at end of session. Pt left seated in w/c, with chair alarm on, and all needs in reach at end of session.   Therapy Documentation Precautions:  Precautions Precautions: None Precaution Comments: WBAT on RLE and RUE, RUE sling for comfort Required Braces or Orthoses: Sling Restrictions Weight Bearing Restrictions: Yes RUE Weight Bearing: Weight bearing as tolerated LUE Weight Bearing: Weight bearing as tolerated RLE Weight Bearing: Weight bearing as tolerated LLE Weight Bearing: Weight bearing as tolerated Other Position/Activity Restrictions: RUE sling for comfort  Pain: 7/10 pain in R shoulder, promoted PROM and provided position of comfort at end of session, improvement reported. RN aware of pain   Therapy/Group: Individual Therapy  [name] E Ariyana Faw 10/03/2021, 7:19 AM

## 2021-10-03 NOTE — Progress Notes (Signed)
Physical Therapy Session Note  Patient Details  Name: Andrea Stewart MRN: [id number] Date of Birth: [birth year]  Today's Date: 10/03/2021 PT Individual Time: 1100-1157 PT Individual Time Calculation (min): 57 min   Short Term Goals: Week 1:  PT Short Term Goal 1 (Week 1): pt will perform bed mobility with min A PT Short Term Goal 2 (Week 1): pt will transfer sit<>stand with LRAD and supervision PT Short Term Goal 3 (Week 1): pt will ambulate 42ft with LRAD and supervision  Skilled Therapeutic Interventions/Progress Updates:   Received pt sitting in recliner slouched down and feeling uncomfortable; therapist suggested repositioning and pt agreed. Pt agreeable to PT treatment, and reported pain 6/10 in RUE at rest increasing to 9/10 with any movement. Repositioning, rest breaks, and distraction done to reduce pain levels. Session with emphasis on functional mobility/transfers, generalized strengthening, dynamic standing balance/coordination, and improved activity tolerance. Pt transferred recliner<>WC stand<>pivot with hemi-walker and min A. Pt required max/total A to scoot hips back in WC. Attempted to locate 20x16 WC due to pt's height, however none available. Pt transported to/from room in [facility] total A for time management purposes. Sit<>stand with hemi-walker and heavy min A and ambulated 67ft with hemi-walker and min A - cues for AD placement and technique. Pt limited by pain, weakness, and fatigue stating "please let me sit down". Pt unsure when she received pain meds - asked RN and pt had not received pain meds since last night. RN present to administer medication during session and discussed with care team getting pain meds scheulded as pt forgets to ask. Pt performed the following exercises sitting in WC with 3in step supporting feet with emphasis on LE strength/ROM:  -LAQ 2x10 on LLE and 1x7 on RLE - extremely limited by pain (moaning, grimacing, and whining throughout) -hip flexion 2x10 on  LLE  Pt unable to tolerate any further activity but agreed to sit up for lunch. Concluded session with pt sitting in WC, needs within reach, and seatbelt alarm on.   Therapy Documentation Precautions:  Precautions Precautions: None Precaution Comments: WBAT on RLE and RUE, RUE sling for comfort Required Braces or Orthoses: Sling Restrictions Weight Bearing Restrictions: Yes RUE Weight Bearing: Weight bearing as tolerated LUE Weight Bearing: Weight bearing as tolerated RLE Weight Bearing: Weight bearing as tolerated LLE Weight Bearing: Weight bearing as tolerated Other Position/Activity Restrictions: RUE sling for comfort  Therapy/Group: Individual Therapy Alfonse Alpers PT, DPT   10/03/2021, 7:25 AM

## 2021-10-03 NOTE — Progress Notes (Signed)
Occupational Therapy Session Note  Patient Details  Name: Andrea Stewart MRN: [id number] Date of Birth: 26-Dec-1945  Today's Date: 10/03/2021 OT Individual Time: 0932-6712 OT Individual Time Calculation (min): 40 min    Short Term Goals: Week 1:  OT Short Term Goal 1 (Week 1): Patient will use adaptive equipment for LB ADLs with mod A OT Short Term Goal 2 (Week 1): Patient will complete 1 step of toileting task OT Short Term Goal 3 (Week 1): Patient will maintain standing balance for 2 minutes in preparation for BADL Task  Skilled Therapeutic Interventions/Progress Updates:  Skilled OT intervention completed with focus on PROM of RUE. Pt received seated in w/c with son present, agreeable to session. Reported improvement in pain upon receiving pain meds, which had not been given since yesterday PM. Transported in w/c with total A to therapy gym. Sit to stand using hemi-walker with min A, then stand pivot with min A. Cues needed for advancing RLE throughout transfer and how to position hemi walker. Increased time needed for transfer due to decreased motor planning. While seated EOM, pt participated in tabletop PROM slides using wash cloth under R hand, to promote functional movement in the RUE including the following exercises:  Shoulder flexion x10 Shoulder external rotation x10 Wash cloth squeezes x10  Pt required intermittent visual targets to encourage ROM. Pt encouraged to do AROM of elbow by bringing R hand to attempt touching mask, with pt needing support underneath R elbow however able to complete 5 elbow flexions with encouragement. Sit > stand using hemi walker and min A, then stand pivot with min A to w/c. Pt transported back to room with total A for time. Donned sling with total A, with pillows provided to place hand in elevated position to minimize distal extremity swelling. Pt left seated in w/c, with alarm belt on and all needs in reach at end of session.   Therapy  Documentation Precautions:  Precautions Precautions: None Precaution Comments: WBAT on RLE and RUE, RUE sling for comfort Required Braces or Orthoses: Sling Restrictions Weight Bearing Restrictions: Yes RUE Weight Bearing: Weight bearing as tolerated LUE Weight Bearing: Weight bearing as tolerated RLE Weight Bearing: Weight bearing as tolerated LLE Weight Bearing: Weight bearing as tolerated Other Position/Activity Restrictions: RUE sling for comfort  Pain: Unrated pain in R shoulder during PROM, however denied intervention reporting "it feels a little better when I move it"   Therapy/Group: Individual Therapy  Brandice Busser E Flecia Shutter 10/03/2021, 7:22 AM

## 2021-10-03 NOTE — Progress Notes (Signed)
Patient with elevated CBG of 419 at 0514. MD notified. Per provider one time dose of 9 units administered subQ. Held scheduled 6 units and sliding scale dose scheduled for 0800. Will notify oncoming shift to recheck.

## 2021-10-03 NOTE — Progress Notes (Addendum)
Physical Therapy Session Note  Patient Details  Name: ILAYDA TODA MRN: [id number] Date of Birth: October 23, 1946  Today's Date: 10/03/2021 PT Individual Time: 0920-1012 PT Individual Time Calculation (min): 52 min   Short Term Goals: Week 1:  PT Short Term Goal 1 (Week 1): pt will perform bed mobility with min A PT Short Term Goal 2 (Week 1): pt will transfer sit<>stand with LRAD and supervision PT Short Term Goal 3 (Week 1): pt will ambulate 66ft with LRAD and supervision  Skilled Therapeutic Interventions/Progress Updates:  Pt sitting on toilet with NT in attendance.  Pt continent of B and B.  Sit> stand and pivot to transfer with mod assist and HW. +2 for peri care in standing.  +2 to pull up pants.  Hand cleaning at wc level.  Therapeutic exercisesperformed with LEs to increase strength for functional mobility. Use of Kinetron without resistance , bil LEs for AROM, x 20 cycles, x 10 cycles from wc level.  R hand on R thigh for gentle minimal PROM at elbow and shoulder.   Pt became tearful and related that her dtr died in February 17, 2021, and she feels "like she is going crazy".  PT provided emotional support and will pass information to MSW.   Sit> stand with min assist to [facility], Gait training on level tile, HW x 10' with CG.   At end of session, stand pivot with HW to recliner with min assist.  LEs elevated, back reclined, wc cushion in seat and 2 pillows behind back and 1 under RUE, for comfort.  Seat pad alarm set and needs left at hand.  PT requested NT provided pt with ice pack for RUE.     Therapy Documentation Precautions:  Precautions Precautions: None Precaution Comments: WBAT on RLE and RUE, RUE sling for comfort Required Braces or Orthoses: Sling Restrictions Weight Bearing Restrictions: Yes RUE Weight Bearing: Weight bearing as tolerated LUE Weight Bearing: Weight bearing as tolerated RLE Weight Bearing: Weight bearing as tolerated LLE Weight Bearing: Weight bearing as  tolerated Other Position/Activity Restrictions: RUE sling for comfort          Therapy/Group: Individual Therapy  Shaya Altamura 10/03/2021, 10:20 AM

## 2021-10-03 NOTE — Progress Notes (Signed)
PROGRESS NOTE   Subjective/Complaints: Patient seen sitting up in bed this morning.  She states she slept well overnight.  Noted to have hypoglycemia yesterday, discussed with nursing.  She was seen by nephrology, notes reviewed-changes made to dialysis.  Later informed by nursing, patient requesting scheduled pain meds.  ROS: Denies CP, SOB, N/V/D  Objective:   No results found. Recent Labs    10/01/21 0518 10/03/21 0519  WBC 6.0 5.2  HGB 10.4* 10.1*  HCT 31.9* 32.1*  PLT 321 328    Recent Labs    10/01/21 0518 10/03/21 0519  NA 126* 127*  K 4.1 4.9  CL 93* 93*  CO2 26 27  GLUCOSE 335* 470*  BUN 43* 37*  CREATININE 5.86* 5.52*  CALCIUM 8.2* 8.1*     Intake/Output Summary (Last 24 hours) at 10/03/2021 1207 Last data filed at 10/03/2021 0800 Gross per 24 hour  Intake 10929 ml  Output 10282 ml  Net 647 ml         Physical Exam: Vital Signs Blood pressure 124/62, pulse 70, temperature (!) 97.5 F (36.4 C), temperature source Oral, resp. rate 18, weight 93.2 kg, SpO2 98 %. Constitutional: No distress . Vital signs reviewed.  Obese. HENT: Normocephalic.  Atraumatic. Eyes: EOMI. No discharge. Cardiovascular: No JVD.  RRR. Respiratory: Normal effort.  No stridor.  Bilateral clear to auscultation. GI: Non-distended.  BS +.  + PD. Skin: Warm and dry.  Intact. Psych: Normal mood.  Normal behavior. Musc: No edema in extremities.  No tenderness in extremities. Neuro: Alert RUE/RLE: Limited due to pain  Assessment/Plan: 1. Functional deficits which require 3+ hours per day of interdisciplinary therapy in a comprehensive inpatient rehab setting. Physiatrist is providing close team supervision and 24 hour management of active medical problems listed below. Physiatrist and rehab team continue to assess barriers to discharge/monitor patient progress toward functional and medical goals  Care Tool:  Bathing     Body parts bathed by patient: Right arm, Chest, Abdomen, Right upper leg, Left upper leg, Right lower leg, Left lower leg, Face   Body parts bathed by helper: Right arm, Buttocks, Front perineal area     Bathing assist Assist Level: Moderate Assistance - Patient 50 - 74%     Upper Body Dressing/Undressing Upper body dressing   What is the patient wearing?: Pull over shirt    Upper body assist Assist Level: Maximal Assistance - Patient 25 - 49%    Lower Body Dressing/Undressing Lower body dressing      What is the patient wearing?: Pants     Lower body assist Assist for lower body dressing: Total Assistance - Patient < 25%     Toileting Toileting    Toileting assist Assist for toileting: Total Assistance - Patient < 25%     Transfers Chair/bed transfer  Transfers assist     Chair/bed transfer assist level: Minimal Assistance - Patient > 75%     Locomotion Ambulation   Ambulation assist      Assist level: Contact Guard/Touching assist Assistive device: Walker-hemi Max distance: 10   Walk 10 feet activity   Assist     Assist level: Contact Guard/Touching assist Assistive  device: Walker-hemi   Walk 50 feet activity   Assist Walk 50 feet with 2 turns activity did not occur: Safety/medical concerns (pain, fatigue, weakness, decreased balance)         Walk 150 feet activity   Assist Walk 150 feet activity did not occur: Safety/medical concerns (pain, fatigue, weakness, decreased balance)         Walk 10 feet on uneven surface  activity   Assist Walk 10 feet on uneven surfaces activity did not occur: Safety/medical concerns (pain, fatigue, weakness, decreased balance)         Wheelchair     Assist Is the patient using a wheelchair?: Yes Type of Wheelchair: Manual Wheelchair activity did not occur: Safety/medical concerns (pain, fatigue, weakness, decreased balance)  Wheelchair assist level: Dependent - Patient 0%       Wheelchair 50 feet with 2 turns activity    Assist    Wheelchair 50 feet with 2 turns activity did not occur: Safety/medical concerns (pain, fatigue, weakness, decreased balance)       Wheelchair 150 feet activity     Assist  Wheelchair 150 feet activity did not occur: Safety/medical concerns (pain, fatigue, weakness, decreased balance)       Blood pressure 124/62, pulse 70, temperature (!) 97.5 F (36.4 C), temperature source Oral, resp. rate 18, weight 93.2 kg, SpO2 98 %.    Medical Problem List and Plan: 1.  Nondisplaced right hip greater trochanteric fracture as well as right nondisplaced fracture of the lateral humeral head secondary to fall.  Nonoperative.  Follow-up Dr. Earnestine Leys.  Weightbearing as tolerated.  Shoulder sling for comfort.  Continue CIR 2.  Antithrombotics: -DVT/anticoagulation:  Mechanical:  Antiembolism stockings, knee (TED hose) Bilateral lower extremities.  Check vascular study, has not yet resulted             -antiplatelet therapy: N/A 3. Postoperative pain: Oxycodone to q4H as needed, changed to every 6 hours scheduled 4. Anxiety: Continue Xanax 0.5 mg nightly, Vistaril 25 mg twice daily as needed, trazodone as needed             -antipsychotic agents: N/A 5. Neuropsych: This patient is capable of making decisions on her own behalf. 6. Skin/Wound Care: Routine skin checks 7. Fluids/Electrolytes/Nutrition: Routine in and outs 8.  End-stage renal disease on PD.  Continue peritoneal dialysis 9.  Anemia of chronic disease.    Hemoglobin 10.1 on 11/19  Continue to monitor 10.  Hypertension, but currently soft. Given increase in metoprolol for heart rate, decreased Aldactone to 12.5 mg daily, Toprol-XL 25 mg daily, Lasix 80 mg daily.  Monitor with increased mobility 11.  Diabetes mellitus with hyperglycemia.  Hemoglobin A1c 9.7. increase NovoLog to 6 units 3 times daily, Levemir 12 units twice daily.  Check blood sugars before meals and at  bedtime  Extremely labile on 11/19, scheduled a.m. dose held. 12.  Hyperlipidemia.  Lipitor 13.  COPD with tobacco abuse.  Continue nebulizers as directed.  Check oxygen saturations every shift.  Provide counsel guards to cessation of nicotine products. 14.  Obesity.  BMI 40.07  Dietary follow-up 15. Tachycardia: increase lopressor to 37.5mg - resolved, continue to monitor TID  Controlled on 11/19 16. Hypomagnesemia: will be managed by nephrology while she is inpatient 17. Hyponatremia: likely secondary to volume overload as per nephrology 18.  Sleep disturbance: started Amitriptyline 10mg  HS. Schedule outpatient sleep study  Appears to be improving  LOS: 3 days A FACE TO FACE EVALUATION WAS PERFORMED  Nylani Michetti  Lorie Phenix 10/03/2021, 12:07 PM

## 2021-10-03 NOTE — Progress Notes (Signed)
Patient ID: RHEANNON CERNEY, female   DOB: 06-02-1946, 75 y.o.   MRN: [id number] S: Feels better today.  No new complaints. O:BP 124/62 (BP Location: Right Arm)   Pulse 70   Temp (!) 97.5 F (36.4 C) (Oral)   Resp 18   Wt 93.2 kg   SpO2 98%   BMI 41.50 kg/m   Intake/Output Summary (Last 24 hours) at 10/03/2021 1110 Last data filed at 10/03/2021 0800 Gross per 24 hour  Intake 10929 ml  Output 10282 ml  Net 647 ml   Intake/Output: I/O last 3 completed shifts: In: 21142 [P.O.:1120; Other:20022] Out: 20876 [Other:20876]  Intake/Output this shift:  Total I/O In: 240 [P.O.:240] Out: -  Weight change: 4.6 kg Gen: NAD CVS:RRR Resp: CTA Abd: +BS, soft, NT/ND Ext: 1+ pretibial edema  Recent Labs  Lab 09/27/21 0338 09/28/21 0332 09/29/21 0600 09/30/21 0229 10/01/21 0518 10/03/21 0519  NA 129* 128* 130* 128* 126* 127*  K 2.9* 3.6 4.0 3.9 4.1 4.9  CL 96* 94* 97* 96* 93* 93*  CO2 27 27 27 27 26 27   GLUCOSE 211* 296* 268* 323* 335* 470*  BUN 51* 42* 45* 48* 43* 37*  CREATININE 7.15* 7.00* 6.13* 5.84* 5.86* 5.52*  ALBUMIN  --   --   --   --  1.6* 1.6*  CALCIUM 8.0* 8.3* 8.3* 8.3* 8.2* 8.1*  PHOS  --   --   --   --  4.1 3.6   Liver Function Tests: Recent Labs  Lab 10/01/21 0518 10/03/21 0519  ALBUMIN 1.6* 1.6*   No results for input(s): LIPASE, AMYLASE in the last 168 hours. No results for input(s): AMMONIA in the last 168 hours. CBC: Recent Labs  Lab 09/28/21 0332 09/29/21 0600 09/30/21 0229 10/01/21 0518 10/03/21 0519  WBC 5.8 6.3 7.1 6.0 5.2  HGB 10.2* 10.3* 9.9* 10.4* 10.1*  HCT 30.5* 31.3* 30.0* 31.9* 32.1*  MCV 93.0 92.6 91.5 93.8 95.8  PLT 269 271 307 321 328   Cardiac Enzymes: No results for input(s): CKTOTAL, CKMB, CKMBINDEX, TROPONINI in the last 168 hours. CBG: Recent Labs  Lab 10/02/21 1637 10/02/21 1741 10/02/21 2121 10/03/21 0146 10/03/21 0514  GLUCAP 57* 115* 290* 340* 419*    Iron Studies:  Recent Labs    10/01/21 0518  IRON  47  TIBC 206*  FERRITIN 114   Studies/Results: No results found.  ALPRAZolam  0.5 mg Oral QHS   amitriptyline  10 mg Oral QHS   aspirin EC  81 mg Oral BID   atorvastatin  20 mg Oral Q supper   darbepoetin (ARANESP) injection - NON-DIALYSIS  25 mcg Subcutaneous Q Thu-1800   feeding supplement (NEPRO CARB STEADY)  237 mL Oral Q24H   furosemide  80 mg Oral Daily   gentamicin cream  1 application Topical Daily   insulin aspart  0-9 Units Subcutaneous TID WC   insulin aspart  6 Units Subcutaneous TID WC   insulin detemir  12 Units Subcutaneous BID   magnesium oxide  800 mg Oral BID   metoprolol succinate  37.5 mg Oral Q breakfast   mometasone-formoterol  2 puff Inhalation BID   [START ON 10/04/2021] multivitamin with minerals  1 tablet Oral Daily   pantoprazole  40 mg Oral BID   potassium chloride  40 mEq Oral Daily   spironolactone  12.5 mg Oral Daily   [START ON 10/05/2021] Vitamin D (Ergocalciferol)  50,000 Units Oral Weekly    BMET    Component Value  Date/Time   NA 127 (L) 10/03/2021 0519   NA 139 02/20/2020 0000   K 4.9 10/03/2021 0519   CL 93 (L) 10/03/2021 0519   CO2 27 10/03/2021 0519   GLUCOSE 470 (H) 10/03/2021 0519   BUN 37 (H) 10/03/2021 0519   BUN 38 (A) 02/20/2020 0000   CREATININE 5.52 (H) 10/03/2021 0519   CREATININE 1.65 (H) 07/22/2017 1553   CALCIUM 8.1 (L) 10/03/2021 0519   GFRNONAA 8 (L) 10/03/2021 0519   GFRAA 10 (L) 07/18/2020 1118   CBC    Component Value Date/Time   WBC 5.2 10/03/2021 0519   RBC 3.35 (L) 10/03/2021 0519   HGB 10.1 (L) 10/03/2021 0519   HCT 32.1 (L) 10/03/2021 0519   PLT 328 10/03/2021 0519   MCV 95.8 10/03/2021 0519   MCH 30.1 10/03/2021 0519   MCHC 31.5 10/03/2021 0519   RDW 14.4 10/03/2021 0519   LYMPHSABS 2.0 09/21/2021 0225   MONOABS 0.5 09/21/2021 0225   EOSABS 0.0 09/21/2021 0225   BASOSABS 0.1 09/21/2021 0225    Dialysis Orders: Center: [facility]  on [facility] . EDW 73 kg, 5 exchanges, 2 liters fill, fill  time 10 min, dwell time 1.5 hrs, drain time 20 minutes.     Assessment/Plan:  S/p right hip and shoulder fracture - no surgery deemed necessary and now in rehab.  ESRD -  will continue with [facility]CPD at her outpatient prescription.  Hypertension/volume  - weight is up.  Will use half 4.25% and 2.5% due to weight gain, edema, and poor UF.  She is almost 20 kg above her edw but no SOB.    Anemia  - Hgb trending down.  Will start Aranesp and follow h/h and iron stores.  Metabolic bone disease -  continue with home meds  Nutrition -  renal diet, carb modified.  Hyponatremia - likely due to volume overload.  UF with CCPD as above.  Dm type 2 - poorly controlled with Hgb A1c 9.5%, per primary service.   Donetta Potts, MD [employer] [phone]

## 2021-10-04 DIAGNOSIS — M25511 Pain in right shoulder: Secondary | ICD-10-CM | POA: Diagnosis not present

## 2021-10-04 DIAGNOSIS — R609 Edema, unspecified: Secondary | ICD-10-CM

## 2021-10-04 DIAGNOSIS — R7309 Other abnormal glucose: Secondary | ICD-10-CM | POA: Diagnosis not present

## 2021-10-04 DIAGNOSIS — G479 Sleep disorder, unspecified: Secondary | ICD-10-CM | POA: Diagnosis not present

## 2021-10-04 DIAGNOSIS — D638 Anemia in other chronic diseases classified elsewhere: Secondary | ICD-10-CM | POA: Diagnosis not present

## 2021-10-04 DIAGNOSIS — S72001D Fracture of unspecified part of neck of right femur, subsequent encounter for closed fracture with routine healing: Secondary | ICD-10-CM | POA: Diagnosis not present

## 2021-10-04 LAB — GLUCOSE, CAPILLARY
Glucose-Capillary: 335 mg/dL — ABNORMAL HIGH (ref 70–99)
Glucose-Capillary: 223 mg/dL — ABNORMAL HIGH (ref 70–99)
Glucose-Capillary: 122 mg/dL — ABNORMAL HIGH (ref 70–99)
Glucose-Capillary: 46 mg/dL — ABNORMAL LOW (ref 70–99)
Glucose-Capillary: 88 mg/dL (ref 70–99)

## 2021-10-04 MED ORDER — DELFLEX-LC/4.25% DEXTROSE 483 MOSM/L IP SOLN
Freq: Every day | INTRAPERITONEAL | Status: DC
  Administered 2021-10-05 – 2021-10-06 (×2): 5000 mL via INTRAPERITONEAL

## 2021-10-04 MED ORDER — DELFLEX-LC/2.5% DEXTROSE 394 MOSM/L IP SOLN
INTRAPERITONEAL | Status: DC
  Administered 2021-10-05: 5000 mL via INTRAPERITONEAL

## 2021-10-04 MED ORDER — HEPARIN 1000 UNIT/ML FOR PERITONEAL DIALYSIS
500.0000 [IU] | INTRAMUSCULAR | Status: DC | PRN
  Filled 2021-10-04: qty 0.5

## 2021-10-04 MED ORDER — GENTAMICIN SULFATE 0.1 % EX CREA
1.0000 "application " | TOPICAL_CREAM | Freq: Every day | CUTANEOUS | Status: DC
  Administered 2021-10-05: 1 via TOPICAL
  Filled 2021-10-04: qty 15

## 2021-10-04 NOTE — Progress Notes (Addendum)
Occupational Therapy Session Note  Patient Details  Name: Andrea Stewart MRN: [id number] Date of Birth: 17-Dec-1945  Today's Date: 10/04/2021 OT Individual Time: 1400-1425 OT Individual Time Calculation (min): 25 min    Short Term Goals: Week 1:  OT Short Term Goal 1 (Week 1): Patient will use adaptive equipment for LB ADLs with mod A OT Short Term Goal 2 (Week 1): Patient will complete 1 step of toileting task OT Short Term Goal 3 (Week 1): Patient will maintain standing balance for 2 minutes in preparation for BADL Task   Skilled Therapeutic Interventions/Progress Updates:    Pt semi reclined in bed, reporting "I feel better now" stating she just got back in bed and feels very tired.  Pt observed intermittently falling asleep during OT session and politely refusing OOB at this time.  Pt instructed in right elbow flex/ext, forearm pro/sup, wrist flex/ext, digital flex/ext 2 x 10 reps each to promote increased strength and reduce swelling in RUE especially right hand which has moderate swelling noted.  Kinesiotape applied to dorsum of right forearm and hand to facilitate reduced swelling and lymph drainage.  RUE supported and elevated with pillows hand placed above heart.  35 minutes of treatment missed due to lethargy and fatigue.  Therapy Documentation Precautions:  Precautions Precautions: None Precaution Comments: WBAT on RLE and RUE, RUE sling for comfort Required Braces or Orthoses: Sling Restrictions Weight Bearing Restrictions: Yes RUE Weight Bearing: Weight bearing as tolerated LUE Weight Bearing: Weight bearing as tolerated RLE Weight Bearing: Weight bearing as tolerated LLE Weight Bearing: Weight bearing as tolerated Other Position/Activity Restrictions: RUE sling for comfort   Therapy/Group: Individual Therapy  Ezekiel Slocumb 10/04/2021, 4:07 PM

## 2021-10-04 NOTE — Progress Notes (Signed)
Patient ID: Andrea Stewart, female   DOB: 1946-06-24, 75 y.o.   MRN: [id number] S: Feels better and had improvement of UF with CCPD last night. O:BP 135/80 (BP Location: Left Arm)   Pulse 66   Temp (!) 97.5 F (36.4 C) (Oral)   Resp 16   Wt 93.2 kg   SpO2 98%   BMI 41.50 kg/m   Intake/Output Summary (Last 24 hours) at 10/04/2021 1029 Last data filed at 10/04/2021 0824 Gross per 24 hour  Intake 600 ml  Output 1208 ml  Net -608 ml   Intake/Output: I/O last 3 completed shifts: In: 10969 [P.O.:960; Other:10009] Out: 10282 [Other:10282]  Intake/Output this shift:  Total I/O In: 120 [P.O.:120] Out: 1208 [Other:1208] Weight change:  Gen: NAD CVS: RRR Resp: occ rhonchi bilaterally Abd: +BS, soft, NT/ND Ext: 1+ pretibial edema and RUE hand  Recent Labs  Lab 09/28/21 0332 09/29/21 0600 09/30/21 0229 10/01/21 0518 10/03/21 0519  NA 128* 130* 128* 126* 127*  K 3.6 4.0 3.9 4.1 4.9  CL 94* 97* 96* 93* 93*  CO2 27 27 27 26 27   GLUCOSE 296* 268* 323* 335* 470*  BUN 42* 45* 48* 43* 37*  CREATININE 7.00* 6.13* 5.84* 5.86* 5.52*  ALBUMIN  --   --   --  1.6* 1.6*  CALCIUM 8.3* 8.3* 8.3* 8.2* 8.1*  PHOS  --   --   --  4.1 3.6   Liver Function Tests: Recent Labs  Lab 10/01/21 0518 10/03/21 0519  ALBUMIN 1.6* 1.6*   No results for input(s): LIPASE, AMYLASE in the last 168 hours. No results for input(s): AMMONIA in the last 168 hours. CBC: Recent Labs  Lab 09/28/21 0332 09/29/21 0600 09/30/21 0229 10/01/21 0518 10/03/21 0519  WBC 5.8 6.3 7.1 6.0 5.2  HGB 10.2* 10.3* 9.9* 10.4* 10.1*  HCT 30.5* 31.3* 30.0* 31.9* 32.1*  MCV 93.0 92.6 91.5 93.8 95.8  PLT 269 271 307 321 328   Cardiac Enzymes: No results for input(s): CKTOTAL, CKMB, CKMBINDEX, TROPONINI in the last 168 hours. CBG: Recent Labs  Lab 10/03/21 0514 10/03/21 1157 10/03/21 1628 10/03/21 2101 10/04/21 0542  GLUCAP 419* 187* 123* 171* 335*    Iron Studies: No results for input(s): IRON, TIBC,  TRANSFERRIN, FERRITIN in the last 72 hours. Studies/Results: No results found.  ALPRAZolam  0.5 mg Oral QHS   amitriptyline  10 mg Oral QHS   aspirin EC  81 mg Oral BID   atorvastatin  20 mg Oral Q supper   darbepoetin (ARANESP) injection - NON-DIALYSIS  25 mcg Subcutaneous Q Thu-1800   feeding supplement (NEPRO CARB STEADY)  237 mL Oral Q24H   furosemide  80 mg Oral Daily   gentamicin cream  1 application Topical Daily   insulin aspart  0-9 Units Subcutaneous TID WC   insulin aspart  6 Units Subcutaneous TID WC   insulin detemir  12 Units Subcutaneous BID   magnesium oxide  800 mg Oral BID   metoprolol succinate  37.5 mg Oral Q breakfast   mometasone-formoterol  2 puff Inhalation BID   multivitamin with minerals  1 tablet Oral Daily   oxyCODONE  5 mg Oral Q6H   pantoprazole  40 mg Oral BID   potassium chloride  40 mEq Oral Daily   spironolactone  12.5 mg Oral Daily   [START ON 10/05/2021] Vitamin D (Ergocalciferol)  50,000 Units Oral Weekly    BMET    Component Value Date/Time   NA 127 (L) 10/03/2021 9924  NA 139 02/20/2020 0000   K 4.9 10/03/2021 0519   CL 93 (L) 10/03/2021 0519   CO2 27 10/03/2021 0519   GLUCOSE 470 (H) 10/03/2021 0519   BUN 37 (H) 10/03/2021 0519   BUN 38 (A) 02/20/2020 0000   CREATININE 5.52 (H) 10/03/2021 0519   CREATININE 1.65 (H) 07/22/2017 1553   CALCIUM 8.1 (L) 10/03/2021 0519   GFRNONAA 8 (L) 10/03/2021 0519   GFRAA 10 (L) 07/18/2020 1118   CBC    Component Value Date/Time   WBC 5.2 10/03/2021 0519   RBC 3.35 (L) 10/03/2021 0519   HGB 10.1 (L) 10/03/2021 0519   HCT 32.1 (L) 10/03/2021 0519   PLT 328 10/03/2021 0519   MCV 95.8 10/03/2021 0519   MCH 30.1 10/03/2021 0519   MCHC 31.5 10/03/2021 0519   RDW 14.4 10/03/2021 0519   LYMPHSABS 2.0 09/21/2021 0225   MONOABS 0.5 09/21/2021 0225   EOSABS 0.0 09/21/2021 0225   BASOSABS 0.1 09/21/2021 0225    Dialysis Orders: Center: [facility]  on [facility] . EDW 73 kg, 5 exchanges, 2  liters fill, fill time 10 min, dwell time 1.5 hrs, drain time 20 minutes.     Assessment/Plan:  S/p right hip and shoulder fracture - no surgery deemed necessary and now in rehab.  ESRD -  will continue with CCPD at her outpatient prescription.  Hypertension/volume  - weight is up.  Will use half 4.25% and 2.5% due to weight gain, edema, and poor UF with 2.5%.  She is almost 20 kg above her edw but no SOB.  May need to use all 4.25% if she doesn't continue to UF.    Anemia  - Hgb trending down.  Will start Aranesp and follow h/h and iron stores.  Metabolic bone disease -  continue with home meds  Nutrition -  renal diet, carb modified.  Hyponatremia - likely due to volume overload.  UF with CCPD as above.  Dm type 2 - poorly controlled with Hgb A1c 9.5%, per primary service.     Donetta Potts, MD [employer] [phone]

## 2021-10-04 NOTE — Progress Notes (Signed)
PROGRESS NOTE   Subjective/Complaints: Patient seen sitting up in her chair this AM.  She states she slept well overnight.  She states CBGs were improved yesterday.  She denies complaints. She was seen by Nephro this AM, notes reviewed - adjusted dialysis.   ROS: Denies CP, SOB, N/V/D  Objective:   No results found. Recent Labs    10/03/21 0519  WBC 5.2  HGB 10.1*  HCT 32.1*  PLT 328    Recent Labs    10/03/21 0519  NA 127*  K 4.9  CL 93*  CO2 27  GLUCOSE 470*  BUN 37*  CREATININE 5.52*  CALCIUM 8.1*     Intake/Output Summary (Last 24 hours) at 10/04/2021 1606 Last data filed at 10/04/2021 1315 Gross per 24 hour  Intake 480 ml  Output 1208 ml  Net -728 ml         Physical Exam: Vital Signs Blood pressure (!) 120/48, pulse 66, temperature 97.6 F (36.4 C), resp. rate 18, weight 93.2 kg, SpO2 100 %. Constitutional: No distress . Vital signs reviewed. Morbidly obese.  HENT: Normocephalic.  Atraumatic. Eyes: EOMI. No discharge. Cardiovascular: No JVD.  RRR. Respiratory: Normal effort.  No stridor.  Bilateral clear to auscultation. GI: Non-distended.  BS +. Skin: Warm and dry.  Intact. Psych: Normal mood.  Normal behavior. Musc: RUE edema. Proximal RUE/RLE tenderness. Neuro: Alert RUE/RLE: Limited due to pain, stable > proximally  Assessment/Plan: 1. Functional deficits which require 3+ hours per day of interdisciplinary therapy in a comprehensive inpatient rehab setting. Physiatrist is providing close team supervision and 24 hour management of active medical problems listed below. Physiatrist and rehab team continue to assess barriers to discharge/monitor patient progress toward functional and medical goals  Care Tool:  Bathing    Body parts bathed by patient: Right arm, Chest, Abdomen, Right upper leg, Left upper leg, Right lower leg, Left lower leg, Face   Body parts bathed by helper: Right  arm, Buttocks, Front perineal area     Bathing assist Assist Level: Moderate Assistance - Patient 50 - 74%     Upper Body Dressing/Undressing Upper body dressing   What is the patient wearing?: Pull over shirt    Upper body assist Assist Level: Maximal Assistance - Patient 25 - 49%    Lower Body Dressing/Undressing Lower body dressing      What is the patient wearing?: Pants     Lower body assist Assist for lower body dressing: Total Assistance - Patient < 25%     Toileting Toileting    Toileting assist Assist for toileting: Total Assistance - Patient < 25%     Transfers Chair/bed transfer  Transfers assist     Chair/bed transfer assist level: Minimal Assistance - Patient > 75%     Locomotion Ambulation   Ambulation assist      Assist level: Contact Guard/Touching assist Assistive device: Walker-hemi Max distance: 10   Walk 10 feet activity   Assist     Assist level: Contact Guard/Touching assist Assistive device: Walker-hemi   Walk 50 feet activity   Assist Walk 50 feet with 2 turns activity did not occur: Safety/medical concerns (pain, fatigue, weakness, decreased  balance)         Walk 150 feet activity   Assist Walk 150 feet activity did not occur: Safety/medical concerns (pain, fatigue, weakness, decreased balance)         Walk 10 feet on uneven surface  activity   Assist Walk 10 feet on uneven surfaces activity did not occur: Safety/medical concerns (pain, fatigue, weakness, decreased balance)         Wheelchair     Assist Is the patient using a wheelchair?: Yes Type of Wheelchair: Manual Wheelchair activity did not occur: Safety/medical concerns (pain, fatigue, weakness, decreased balance)  Wheelchair assist level: Dependent - Patient 0%      Wheelchair 50 feet with 2 turns activity    Assist    Wheelchair 50 feet with 2 turns activity did not occur: Safety/medical concerns (pain, fatigue, weakness,  decreased balance)       Wheelchair 150 feet activity     Assist  Wheelchair 150 feet activity did not occur: Safety/medical concerns (pain, fatigue, weakness, decreased balance)       Blood pressure (!) 120/48, pulse 66, temperature 97.6 F (36.4 C), resp. rate 18, weight 93.2 kg, SpO2 100 %.    Medical Problem List and Plan: 1.  Nondisplaced right hip greater trochanteric fracture as well as right nondisplaced fracture of the lateral humeral head secondary to fall.  Nonoperative.  Follow-up Dr. Earnestine Leys.  Weightbearing as tolerated.  Shoulder sling for comfort.  Continue CIR 2.  Antithrombotics: -DVT/anticoagulation:  Mechanical:  Antiembolism stockings, knee (TED hose) Bilateral lower extremities.               -antiplatelet therapy: N/A 3. Postoperative pain: Oxycodone to q4H as needed, changed to every 6 hours scheduled  Relatively controlled with meds on 11/20 4. Anxiety: Continue Xanax 0.5 mg nightly, Vistaril 25 mg twice daily as needed, trazodone as needed             -antipsychotic agents: N/A 5. Neuropsych: This patient is capable of making decisions on her own behalf. 6. Skin/Wound Care: Routine skin checks 7. Fluids/Electrolytes/Nutrition: Routine in and outs 8.  End-stage renal disease on PD.  Continue peritoneal dialysis 9.  Anemia of chronic disease.    Hemoglobin 10.1 on 11/19  Continue to monitor 10.  Hypertension, but currently soft. Given increase in metoprolol for heart rate, decreased Aldactone to 12.5 mg daily, Toprol-XL 25 mg daily, Lasix 80 mg daily.  Monitor with increased mobility 11.  Diabetes mellitus with hyperglycemia.  Hemoglobin A1c 9.7. increase NovoLog to 6 units 3 times daily, Levemir 12 units twice daily.  Check blood sugars before meals and at bedtime  Labile, improving on 11/20, will likely need to increase long acting 12.  Hyperlipidemia.  Lipitor 13.  COPD with tobacco abuse.  Continue nebulizers as directed.  Check oxygen  saturations every shift.  Provide counsel guards to cessation of nicotine products. 14.  Obesity.  BMI 40.07  Dietary follow-up 15. Tachycardia: increase lopressor to 37.5mg - resolved, continue to monitor TID  Controlled on 11/20 16. Hypomagnesemia: will be managed by nephrology while she is inpatient 17. Hyponatremia: likely secondary to volume overload as per nephrology 18.  Sleep disturbance: started Amitriptyline 10mg  HS. Schedule outpatient sleep study  Appears to be improving  LOS: 4 days A FACE TO FACE EVALUATION WAS PERFORMED  Andrea Stewart Andrea Stewart 10/04/2021, 4:06 PM

## 2021-10-04 NOTE — Significant Event (Signed)
Hypoglycemic Event  CBG: 46  Treatment: 8 oz juice/soda  Symptoms: Pale and Sweaty  Follow-up CBG: Time:2128 CBG Result:88  Possible Reasons for Event: Unknown  Comments/MD notified:MD Patel called at 2137. Awaiting call back. Spoke with MD Posey Pronto at 2200. No new orders received. Order ok to give scheduled Levemir.    Pansie Guggisberg, [facility]

## 2021-10-04 NOTE — Progress Notes (Signed)
Physical Therapy Session Note  Patient Details  Name: Andrea Stewart MRN: [id number] Date of Birth: 03/22/1946  Today's Date: 10/04/2021 PT Individual Time: 5784-6962; 9528-4132 PT Individual Time Calculation (min): 40 min and 45 min  Short Term Goals: Week 1:  PT Short Term Goal 1 (Week 1): pt will perform bed mobility with min A PT Short Term Goal 2 (Week 1): pt will transfer sit<>stand with LRAD and supervision PT Short Term Goal 3 (Week 1): pt will ambulate 61ft with LRAD and supervision  Skilled Therapeutic Interventions/Progress Updates:    Session 1: Pt received seated in w/c in room, reports feeling very sleepy this AM. Pt agreeable with encouragement to participate in therapy session as able. Pt also reports constant pain in her RUE with no relief. Utilized distraction, repositioning, and ice packs at end of session for pain management. Sit to stand with min A to [facility] with increased time needed to complete transfer. Ambulation 2 x 10 ft with HW and min A for balance, antalgic gait pattern with flexed trunk and decreased tolerance for stepping with RLE. Pt requires extended seated rest break between bouts of ambulation. Pt returned to bed at end of session due to fatigue. Sit to supine mod A needed for BLE management. Supine to L sidelying with min A needed, positioned for comfort. Placed ice pack to R shoulder and R groin per pt request for pain management. Pt left in L sidelying in bed with needs in reach.  Session 2: Pt received seated EOB visiting with her daughter in law, agreeable to PT session. Pt reports ongoing pain in R shoulder and pain in R hip with mobility. Utilized ice packs at end of session for pain management. Sit to stand with min A to [facility] during session. Ambulation 2 x 20 ft in room with HW and min A for balance. With onset of fatigue pt exhibits increase in antalgic gait pattern and increase in difficulty with moving RLE. Attempt to have pt perform sit to stand x 5  reps from EOB to [facility], pt able to perform 3 reps with min A before onset of R hip pain too great to continue. Pt requests to lay back down at end of session. Sit to supine mod A for BLE management. Pt left in L sidelying in bed with needs in reach, ice pack to R shoulder and R groin per pt request.  Therapy Documentation Precautions:  Precautions Precautions: None Precaution Comments: WBAT on RLE and RUE, RUE sling for comfort Required Braces or Orthoses: Sling Restrictions Weight Bearing Restrictions: Yes RUE Weight Bearing: Weight bearing as tolerated LUE Weight Bearing: Weight bearing as tolerated RLE Weight Bearing: Weight bearing as tolerated LLE Weight Bearing: Weight bearing as tolerated Other Position/Activity Restrictions: RUE sling for comfort    Therapy/Group: Individual Therapy   Excell Seltzer, PT, DPT, CSRS  10/04/2021, 12:30 PM

## 2021-10-05 DIAGNOSIS — E1129 Type 2 diabetes mellitus with other diabetic kidney complication: Secondary | ICD-10-CM | POA: Diagnosis not present

## 2021-10-05 DIAGNOSIS — I120 Hypertensive chronic kidney disease with stage 5 chronic kidney disease or end stage renal disease: Secondary | ICD-10-CM | POA: Diagnosis not present

## 2021-10-05 DIAGNOSIS — Z992 Dependence on renal dialysis: Secondary | ICD-10-CM | POA: Diagnosis not present

## 2021-10-05 DIAGNOSIS — S72001A Fracture of unspecified part of neck of right femur, initial encounter for closed fracture: Secondary | ICD-10-CM | POA: Diagnosis not present

## 2021-10-05 DIAGNOSIS — N186 End stage renal disease: Secondary | ICD-10-CM | POA: Diagnosis not present

## 2021-10-05 LAB — GLUCOSE, CAPILLARY
Glucose-Capillary: 312 mg/dL — ABNORMAL HIGH (ref 70–99)
Glucose-Capillary: 361 mg/dL — ABNORMAL HIGH (ref 70–99)
Glucose-Capillary: 250 mg/dL — ABNORMAL HIGH (ref 70–99)
Glucose-Capillary: 46 mg/dL — ABNORMAL LOW (ref 70–99)
Glucose-Capillary: 49 mg/dL — ABNORMAL LOW (ref 70–99)
Glucose-Capillary: 53 mg/dL — ABNORMAL LOW (ref 70–99)
Glucose-Capillary: 52 mg/dL — ABNORMAL LOW (ref 70–99)
Glucose-Capillary: 103 mg/dL — ABNORMAL HIGH (ref 70–99)
Glucose-Capillary: 337 mg/dL — ABNORMAL HIGH (ref 70–99)

## 2021-10-05 MED ORDER — GLUCOSE 40 % PO GEL
ORAL | Status: AC
  Administered 2021-10-05: 31 g
  Filled 2021-10-05: qty 1

## 2021-10-05 MED ORDER — INSULIN DETEMIR 100 UNIT/ML ~~LOC~~ SOLN
16.0000 [IU] | Freq: Two times a day (BID) | SUBCUTANEOUS | Status: DC
  Administered 2021-10-05 – 2021-10-06 (×3): 16 [IU] via SUBCUTANEOUS
  Filled 2021-10-05 (×5): qty 0.16

## 2021-10-05 MED ORDER — INSULIN ASPART 100 UNIT/ML IJ SOLN
3.0000 [IU] | Freq: Three times a day (TID) | INTRAMUSCULAR | Status: DC
Start: 2021-10-05 — End: 2021-10-11
  Administered 2021-10-06 – 2021-10-11 (×9): 3 [IU] via SUBCUTANEOUS

## 2021-10-05 NOTE — Progress Notes (Addendum)
Hypoglycemic Event  CBG: 46  Treatment: 4 oz juice/soda  Symptoms: None  Follow-up CBG: Time:1700 CBG Result:49  Possible Reasons for Event: Unknown  Comments/MD notified:Dan     Lambert Keto  Patient had a low BS of 46 at 1644. Apple juice, graham crackers and ensure provided. Upon several rechecks patient blood sugar remained in the low 50's. Continued to encourage patient to eat her dinner, patient states I not eating this shit. Patient consumed around 25% of her dinner. Insulin held. P.Love ordered 1 amp of glucose. Due to continued low BS. Upon 45 minute recheck BS 103. Continued to encourage snacks and fluids. Dialysis Nurse notified of low BS. Call light within reach, bed in low position.

## 2021-10-05 NOTE — Progress Notes (Signed)
Occupational Therapy Session Note  Patient Details  Name: Andrea Stewart MRN: [id number] Date of Birth: 11-25-1945  Today's Date: 10/05/2021 OT Individual Time: 0900-1015 OT Individual Time Calculation (min): 75 min    Short Term Goals: Week 1:  OT Short Term Goal 1 (Week 1): Patient will use adaptive equipment for LB ADLs with mod A OT Short Term Goal 2 (Week 1): Patient will complete 1 step of toileting task OT Short Term Goal 3 (Week 1): Patient will maintain standing balance for 2 minutes in preparation for BADL Task  Skilled Therapeutic Interventions/Progress Updates:  Pt greeted seated in w/c agreeable to OT intervention. Session focus on functional mobility, sit<>stands, ADL transfers, AAROM/PROM of RUE. Pt denied need for ADLs, total A transport to gym from w/c for time mgmt. Worked on PROM with RUE with pt able to complete x10 reps or elbow flexion/extension. Worked on [employer] with pt able to complete scapular protraction/retraction to reach for pegs on peg board, pt reports numbness and tingling in fingers when reaching for pegs, impaired [place] noted. Worked on sit<>stands from w/c with pt needing CGA and increased time and effort to stand from wc with HW,  graded task up and added in dynamic reaching to standing with pt able to stand from 2 mins to place pegs in board with LUE. Pt then reprots "not feeling right" feeling like her blood sugar may have dropped, returned pt to room with total A where NT checked her blood sugar; blood sugar >100. Pt reports to need to void b/b. MIN A for stand pivot transfer form w/c >BSC with HW. MAX A for 3/3 toileting tasks with pt needing assistance for posterior pericare( did provide demo and visual aid of toileting wand), pt was able to assist with clothing mgmt on L side but needed assist on R side.  Did discuss using RUE as gross assist as much as possible during ADLs especially during self feeding, pt need MAX multimodal cues to problem solve how to  incorporate RUE into ADLS. Pt completed additional stand pivot transfer from BSC>recliner wit HW and MINA, +2 needed to scoot hips back in recliner as pt with difficulty reciprocal scooting.  pt left seated in recliner with chair alarm activated and all needs within reach.                   Therapy Documentation Precautions:  Precautions Precautions: None Precaution Comments: WBAT on RLE and RUE, RUE sling for comfort Required Braces or Orthoses: Sling Restrictions Weight Bearing Restrictions: Yes RUE Weight Bearing: Weight bearing as tolerated LUE Weight Bearing: Weight bearing as tolerated RLE Weight Bearing: Weight bearing as tolerated LLE Weight Bearing: Weight bearing as tolerated Other Position/Activity Restrictions: RUE sling for comfort  Pain: pt reports increased pain in RUE, offered rest breaks and repositioning as pain mgmt     Therapy/Group: Individual Therapy  Corinne Ports [facility] 10/05/2021, 12:02 PM

## 2021-10-05 NOTE — Progress Notes (Addendum)
Patient refused SCD's, informed of purpose and complications states she is ware.Assigned nurse Benjamine Mola, RN)made aware

## 2021-10-05 NOTE — Progress Notes (Signed)
PROGRESS NOTE   Subjective/Complaints: Sleepy this AM.  Appreciate diabetes program recommendations: CBGs 46-335. Levemir increased to 16U BID and Novolog decreased to 3U  ROS: Unable to obtain given current somnolence  Objective:   No results found. Recent Labs    10/03/21 0519  WBC 5.2  HGB 10.1*  HCT 32.1*  PLT 328   Recent Labs    10/03/21 0519  NA 127*  K 4.9  CL 93*  CO2 27  GLUCOSE 470*  BUN 37*  CREATININE 5.52*  CALCIUM 8.1*    Intake/Output Summary (Last 24 hours) at 10/05/2021 1159 Last data filed at 10/05/2021 0900 Gross per 24 hour  Intake 10600 ml  Output 11000 ml  Net -400 ml        Physical Exam: Vital Signs Blood pressure 137/84, pulse 60, temperature (!) 97.4 F (36.3 C), resp. rate 17, weight 93.2 kg, SpO2 94 %. Constitutional: No distress . Vital signs reviewed. Morbidly obese.  HENT: Normocephalic.  Atraumatic. Eyes: EOMI. No discharge. Cardiovascular: No JVD.  RRR. Respiratory: Normal effort.  No stridor.  Bilateral clear to auscultation. GI: Non-distended.  BS +. Skin: Warm and dry.  Intact. Psych: Normal mood.  Normal behavior. Musc: RUE edema. Proximal RUE/RLE tenderness. Sling in place Neuro: Alert RUE/RLE: Limited due to pain, stable > proximally  Assessment/Plan: 1. Functional deficits which require 3+ hours per day of interdisciplinary therapy in a comprehensive inpatient rehab setting. Physiatrist is providing close team supervision and 24 hour management of active medical problems listed below. Physiatrist and rehab team continue to assess barriers to discharge/monitor patient progress toward functional and medical goals  Care Tool:  Bathing    Body parts bathed by patient: Right arm, Chest, Abdomen, Right upper leg, Left upper leg, Right lower leg, Left lower leg, Face   Body parts bathed by helper: Right arm, Buttocks, Front perineal area     Bathing assist  Assist Level: Moderate Assistance - Patient 50 - 74%     Upper Body Dressing/Undressing Upper body dressing   What is the patient wearing?: Pull over shirt    Upper body assist Assist Level: Maximal Assistance - Patient 25 - 49%    Lower Body Dressing/Undressing Lower body dressing      What is the patient wearing?: Pants     Lower body assist Assist for lower body dressing: Total Assistance - Patient < 25%     Toileting Toileting    Toileting assist Assist for toileting: Maximal Assistance - Patient 25 - 49%     Transfers Chair/bed transfer  Transfers assist     Chair/bed transfer assist level: Minimal Assistance - Patient > 75%     Locomotion Ambulation   Ambulation assist      Assist level: Contact Guard/Touching assist Assistive device: Walker-hemi Max distance: 10   Walk 10 feet activity   Assist     Assist level: Contact Guard/Touching assist Assistive device: Walker-hemi   Walk 50 feet activity   Assist Walk 50 feet with 2 turns activity did not occur: Safety/medical concerns (pain, fatigue, weakness, decreased balance)         Walk 150 feet activity   Assist Walk  150 feet activity did not occur: Safety/medical concerns (pain, fatigue, weakness, decreased balance)         Walk 10 feet on uneven surface  activity   Assist Walk 10 feet on uneven surfaces activity did not occur: Safety/medical concerns (pain, fatigue, weakness, decreased balance)         Wheelchair     Assist Is the patient using a wheelchair?: Yes Type of Wheelchair: Manual Wheelchair activity did not occur: Safety/medical concerns (pain, fatigue, weakness, decreased balance)  Wheelchair assist level: Dependent - Patient 0%      Wheelchair 50 feet with 2 turns activity    Assist    Wheelchair 50 feet with 2 turns activity did not occur: Safety/medical concerns (pain, fatigue, weakness, decreased balance)       Wheelchair 150 feet  activity     Assist  Wheelchair 150 feet activity did not occur: Safety/medical concerns (pain, fatigue, weakness, decreased balance)       Blood pressure 137/84, pulse 60, temperature (!) 97.4 F (36.3 C), resp. rate 17, weight 93.2 kg, SpO2 94 %.    Medical Problem List and Plan: 1.  Nondisplaced right hip greater trochanteric fracture as well as right nondisplaced fracture of the lateral humeral head secondary to fall.  Nonoperative.  Follow-up Dr. Earnestine Leys.  Weightbearing as tolerated.  Shoulder sling for comfort.  Continue CIR 2.  Antithrombotics: -DVT/anticoagulation:  Mechanical:  Antiembolism stockings, knee (TED hose) Bilateral lower extremities.               -antiplatelet therapy: N/A 3. Postoperative pain: Continue oxycodone q6H [facility]  Relatively controlled with meds on 11/20 4. Anxiety: Continue Xanax 0.5 mg nightly, Vistaril 25 mg twice daily as needed, trazodone as needed             -antipsychotic agents: N/A 5. Neuropsych: This patient is capable of making decisions on her own behalf. 6. Skin/Wound Care: Routine skin checks 7. Fluids/Electrolytes/Nutrition: Routine in and outs 8.  End-stage renal disease on PD.  Continue peritoneal dialysis 9.  Anemia of chronic disease.    Hemoglobin 10.1 on 11/19  Continue to monitor 10.  Hypertension, but currently soft. Given increase in metoprolol for heart rate, decreased Aldactone to 12.5 mg daily, Toprol-XL 25 mg daily, Lasix 80 mg daily.  Monitor with increased mobility 11.  Diabetes mellitus with hyperglycemia.  Hemoglobin A1c 9.7. decrease NovoLog to 3 units 3 times daily, Increase Levemir to 16 units twice daily.  Check blood sugars before meals and at bedtime 12.  Hyperlipidemia.  Lipitor 13.  COPD with tobacco abuse.  Continue nebulizers as directed.  Check oxygen saturations every shift.  Provide counsel guards to cessation of nicotine products. 14.  Obesity.  BMI 40.07  Dietary follow-up 15. Tachycardia:  increase lopressor to 37.5mg - resolved, continue to monitor TID 16. Hypomagnesemia: will be managed by nephrology while she is inpatient 17. Hyponatremia: likely secondary to volume overload as per nephrology. Improved to 127 on 11/19. Monitor with dialysis.  18.  Sleep disturbance: started Amitriptyline 10mg  HS. Schedule outpatient sleep study  Appears to be improving 18. Disposition: HFU scheduled with me.   LOS: 5 days A FACE TO FACE EVALUATION WAS PERFORMED  Andrea Stewart Andrea Stewart 10/05/2021, 11:59 AM

## 2021-10-05 NOTE — Progress Notes (Signed)
Physical Therapy Session Note  Patient Details  Name: Andrea Stewart MRN: [id number] Date of Birth: 10/17/1946  Today's Date: 10/05/2021 PT Individual Time: 1346-1430 PT Individual Time Calculation (min): 44 min   Short Term Goals: Week 1:  PT Short Term Goal 1 (Week 1): pt will perform bed mobility with min A PT Short Term Goal 2 (Week 1): pt will transfer sit<>stand with LRAD and supervision PT Short Term Goal 3 (Week 1): pt will ambulate 58ft with LRAD and supervision  Skilled Therapeutic Interventions/Progress Updates: Pt presents supine in bed and agreeable to therapy although fatigued.  Pt required CGA for sup to sit transfer.  Pt sat EOB for adjustment of sling to RUE.  Pt transfers multiple trials sit to stand w/ min A but verbal cues for LUE placement.  Pt amb multiple trials x 20' w/ HW and min A, noted antalgic gait RLE.  Multiple cues for correct sequencing to improve step length (HW, RLE, LLE), but pt returns to previous sequencing.  Verbal cuesfor posture and placement of HW w/ turns and approaching surface for safety.  HW height adjusted for pt height.  Pt returned to room in w/c and discuss w/ NT possibility of transport to visit spouse.     Therapy Documentation Precautions:  Precautions Precautions: None Precaution Comments: WBAT on RLE and RUE, RUE sling for comfort Required Braces or Orthoses: Sling Restrictions Weight Bearing Restrictions: No RUE Weight Bearing: Weight bearing as tolerated LUE Weight Bearing: Weight bearing as tolerated RLE Weight Bearing: Weight bearing as tolerated LLE Weight Bearing: Weight bearing as tolerated Other Position/Activity Restrictions: RUE sling for comfort General:   Vital Signs: Therapy Vitals Temp: 97.9 F (36.6 C) Pulse Rate: (!) 43 Resp: 18 BP: (!) 120/50 Patient Position (if appropriate): Sitting Oxygen Therapy SpO2: 100 % O2 Device: Room Air Pain:5/10 to R LE when WB, but 2/10 when lying. Pain Assessment Pain  Score: 3  Mobility:       Therapy/Group: Individual Therapy  Ladoris Gene 10/05/2021, 2:31 PM

## 2021-10-05 NOTE — Progress Notes (Signed)
Inpatient Diabetes Program Recommendations  AACE/ADA: New Consensus Statement on Inpatient Glycemic Control (2015)  Target Ranges:  Prepandial:   less than 140 mg/dL      Peak postprandial:   less than 180 mg/dL (1-2 hours)      Critically ill patients:  140 - 180 mg/dL   Lab Results  Component Value Date   GLUCAP 361 (H) 10/05/2021   HGBA1C 9.7 (H) 09/21/2021    Review of Glycemic Control  Latest Reference Range & Units 10/04/21 05:42 10/04/21 11:48 10/04/21 16:34 10/04/21 21:02 10/04/21 21:28 10/05/21 06:20  Glucose-Capillary 70 - 99 mg/dL 335 (H) 223 (H) 122 (H) 46 (L) 88 312 (H)   Diabetes history: DM 2 Outpatient Diabetes medications: NPH 20 units qam, 35 units qpm Current orders for Inpatient glycemic control:  Novolog 6 units tid meal coverage Levemir 12 units bid Novolog 0-9 units tid  Nepro Q24 hours  Inpatient Diabetes Program Recommendations:    Note: Hypoglycemia due to amount of Novolog received and insulin stacking, hyperglycemia due to not enough long acting insulin  -  Increase Levemir to 16 units bid - Decrease Novolog meal coverage to 3 units tid if eating >50% of meals  Thanks,  Tama Headings RN, MSN, BC-ADM Inpatient Diabetes Coordinator Team Pager [phone] (8a-5p)

## 2021-10-05 NOTE — Progress Notes (Signed)
Physical Therapy Session Note  Patient Details  Name: Andrea Stewart MRN: [id number] Date of Birth: 05/18/1946  Today's Date: 10/05/2021 PT Individual Time: 6073-7106 and 1100-1157  PT Individual Time Calculation (min): 41 min and 57 min  Short Term Goals: Week 1:  PT Short Term Goal 1 (Week 1): pt will perform bed mobility with min A PT Short Term Goal 2 (Week 1): pt will transfer sit<>stand with LRAD and supervision PT Short Term Goal 3 (Week 1): pt will ambulate 30ft with LRAD and supervision  Skilled Therapeutic Interventions/Progress Updates:   Treatment Session 1 Received pt semi-reclined in bed, pt agreeable to PT treatment, and reported pain 3/10 in RUE (premedicated) increasing with mobility. Repositioning, rest breaks, and distraction done to reduce pain levels. Pt hooked up to dialysis machine; notified RN and PD RN's arrived during session. Session with emphasis on functional mobility/transfers, dressing, and improved activity tolerance. Pt transferred semi-reclined<>sitting EOB with HOB elevated and use of bedrails with CGA. Pt brushed teeth sitting EOB with supervision while peritineal dialysis RNs disconnected lines and checked vitals. Pt required assist to put toothpaste on toothbrush and brushed teeth using LUE for time management purposes. Donned underwear and pants sitting EOB with total A and transferred sit<>stand with hemi-walker and min A - max A to pull pants over hips. Stand<>pivot bed<>WC with hemi-walker and min A and doffed gown and donned pull over shirt with max A starting with RUE first. Kidney MD present for brief assessment then donned RUE sling with max A. Pt required mod A to scoot hips back in WC. Concluded session with pt sitting in WC, needs within reach, and seatbelt alarm on.   Treatment Session 2 Received pt sitting in recliner, pt agreeable to PT treatment, and reported pain 5/10 in RUE. RN notified and present to administer pain medications.  Repositioning, rest breaks, and distraction done to reduce pain levels and provided pt with ice pack for R shoulder at end of session. Session with emphasis on functional mobility/transfers, generalized strengthening, dynamic standing balance/coordination, gait training, and improved activity tolerance. Stand<>pivot recliner<>WC with hemi-walker and min A and pt transported to/from room in [facility] total A for time management purposes. Sit<>stand with hemi-walker and min A and ambulated 3ft with hemi-walker and min A with significantly increased time and 1 standing rest break. Pt ambulates at a decreased cadence with significantly flexed trunk, decreased bilateral foot clearance, and decreased weight shifting to R and required maximal encouragement to make it all the way to the mat - cues for upright posture. Worked on standing tolerance and dynamic balance playing cornhole (using LUE) without AD and min A for balance x 3 trials for 1 minute each trial. Pt required multiple rest breaks throughout session due to fatigue/weakness and pain. Pt performed the following exercise sitting EOM with emphasis on LE strength/ROM: -RLE AAROM heel slides 1x20 and 1x15 -hip adduction ball squeezes 2x10 -LAQ x10 bilaterally  Stand<>pivot mat<>WC with hemi-walker. Concluded session with pt sitting in WC, needs within reach, and seatbelt alarm on.   Therapy Documentation Precautions:  Precautions Precautions: None Precaution Comments: WBAT on RLE and RUE, RUE sling for comfort Required Braces or Orthoses: Sling Restrictions Weight Bearing Restrictions: Yes RUE Weight Bearing: Weight bearing as tolerated LUE Weight Bearing: Weight bearing as tolerated RLE Weight Bearing: Weight bearing as tolerated LLE Weight Bearing: Weight bearing as tolerated Other Position/Activity Restrictions: RUE sling for comfort  Therapy/Group: Individual Therapy Candelaria Arenas PT, DPT   10/05/2021,  7:17 AM

## 2021-10-05 NOTE — Progress Notes (Signed)
Patient ID: Andrea Stewart, female   DOB: 1946-07-04, 75 y.o.   MRN: [id number]  [facility] Progress Note   Assessment/ Plan:   1.  Status post right hip and shoulder fractures: Nonsurgical management with ongoing inpatient rehabilitation. 2. ESRD: Continue peritoneal dialysis with efforts at ultrafiltration given significant volume gain during recent hospitalization.  Discussed the importance of fluid restriction (hyponatremia). 3. Anemia: Hemoglobin and hematocrit borderline, continue ESA and trend iron studies. 4. CKD-MBD: Continue renal diet with ongoing lab monitoring-not on phosphorus binder. 5. Nutrition: Continue renal diet/renal multivitamin and oral nutritional supplementation. 6. Hypertension: Blood pressure marginally elevated, continue to monitor with ultrafiltration.  Subjective:   Denies any chest pain or shortness of breath, inquires about ultrafiltration overnight.   Objective:   BP 137/84 (BP Location: Left Arm)   Pulse 60   Temp (!) 97.4 F (36.3 C)   Resp 17   Wt 93.2 kg   SpO2 94%   BMI 41.50 kg/m   Physical Exam: Gen: Comfortably sitting up in wheelchair, therapist at bedside CVS: Pulse regular rhythm, normal rate, S1 and S2 normal Resp: Decreased breath sounds over bases, no distress Abd: Soft, moderately distended, bowel sounds normal Ext: 2+ pitting edema bilaterally  Labs: BMET Recent Labs  Lab 09/29/21 0600 09/30/21 0229 10/01/21 0518 10/03/21 0519  NA 130* 128* 126* 127*  K 4.0 3.9 4.1 4.9  CL 97* 96* 93* 93*  CO2 27 27 26 27   GLUCOSE 268* 323* 335* 470*  BUN 45* 48* 43* 37*  CREATININE 6.13* 5.84* 5.86* 5.52*  CALCIUM 8.3* 8.3* 8.2* 8.1*  PHOS  --   --  4.1 3.6   CBC Recent Labs  Lab 09/29/21 0600 09/30/21 0229 10/01/21 0518 10/03/21 0519  WBC 6.3 7.1 6.0 5.2  HGB 10.3* 9.9* 10.4* 10.1*  HCT 31.3* 30.0* 31.9* 32.1*  MCV 92.6 91.5 93.8 95.8  PLT 271 307 321 328      Medications:     ALPRAZolam  0.5 mg  Oral QHS   amitriptyline  10 mg Oral QHS   aspirin EC  81 mg Oral BID   atorvastatin  20 mg Oral Q supper   darbepoetin (ARANESP) injection - NON-DIALYSIS  25 mcg Subcutaneous Q Thu-1800   feeding supplement (NEPRO CARB STEADY)  237 mL Oral Q24H   furosemide  80 mg Oral Daily   gentamicin cream  1 application Topical Daily   insulin aspart  0-9 Units Subcutaneous TID WC   insulin aspart  6 Units Subcutaneous TID WC   insulin detemir  12 Units Subcutaneous BID   magnesium oxide  800 mg Oral BID   metoprolol succinate  37.5 mg Oral Q breakfast   mometasone-formoterol  2 puff Inhalation BID   multivitamin with minerals  1 tablet Oral Daily   oxyCODONE  5 mg Oral Q6H   pantoprazole  40 mg Oral BID   potassium chloride  40 mEq Oral Daily   spironolactone  12.5 mg Oral Daily   Vitamin D (Ergocalciferol)  50,000 Units Oral Weekly     Elmarie Shiley, MD 10/05/2021, 8:45 AM

## 2021-10-06 DIAGNOSIS — G8918 Other acute postprocedural pain: Secondary | ICD-10-CM | POA: Diagnosis not present

## 2021-10-06 DIAGNOSIS — D638 Anemia in other chronic diseases classified elsewhere: Secondary | ICD-10-CM | POA: Diagnosis not present

## 2021-10-06 DIAGNOSIS — S72001D Fracture of unspecified part of neck of right femur, subsequent encounter for closed fracture with routine healing: Secondary | ICD-10-CM | POA: Diagnosis not present

## 2021-10-06 DIAGNOSIS — N186 End stage renal disease: Secondary | ICD-10-CM | POA: Diagnosis not present

## 2021-10-06 DIAGNOSIS — E1129 Type 2 diabetes mellitus with other diabetic kidney complication: Secondary | ICD-10-CM | POA: Diagnosis not present

## 2021-10-06 DIAGNOSIS — G479 Sleep disorder, unspecified: Secondary | ICD-10-CM | POA: Diagnosis not present

## 2021-10-06 DIAGNOSIS — I120 Hypertensive chronic kidney disease with stage 5 chronic kidney disease or end stage renal disease: Secondary | ICD-10-CM | POA: Diagnosis not present

## 2021-10-06 DIAGNOSIS — R7309 Other abnormal glucose: Secondary | ICD-10-CM | POA: Diagnosis not present

## 2021-10-06 DIAGNOSIS — Z992 Dependence on renal dialysis: Secondary | ICD-10-CM | POA: Diagnosis not present

## 2021-10-06 LAB — GLUCOSE, CAPILLARY
Glucose-Capillary: 504 mg/dL (ref 70–99)
Glucose-Capillary: 149 mg/dL — ABNORMAL HIGH (ref 70–99)
Glucose-Capillary: 114 mg/dL — ABNORMAL HIGH (ref 70–99)
Glucose-Capillary: 262 mg/dL — ABNORMAL HIGH (ref 70–99)

## 2021-10-06 MED ORDER — DELFLEX-LC/2.5% DEXTROSE 394 MOSM/L IP SOLN
INTRAPERITONEAL | Status: DC
  Administered 2021-10-06 – 2021-10-15 (×3): 5000 mL via INTRAPERITONEAL

## 2021-10-06 MED ORDER — DELFLEX-LC/4.25% DEXTROSE 483 MOSM/L IP SOLN
INTRAPERITONEAL | Status: DC
  Administered 2021-10-06 – 2021-10-15 (×4): 5000 mL via INTRAPERITONEAL

## 2021-10-06 MED ORDER — OXYCODONE HCL 5 MG PO TABS
5.0000 mg | ORAL_TABLET | Freq: Two times a day (BID) | ORAL | Status: DC
  Administered 2021-10-07 – 2021-10-09 (×2): 5 mg via ORAL
  Filled 2021-10-06 (×3): qty 1

## 2021-10-06 MED ORDER — GENTAMICIN SULFATE 0.1 % EX CREA
1.0000 "application " | TOPICAL_CREAM | Freq: Every day | CUTANEOUS | Status: DC
  Administered 2021-10-06 – 2021-10-16 (×8): 1 via TOPICAL
  Filled 2021-10-06: qty 15

## 2021-10-06 MED ORDER — HEPARIN 1000 UNIT/ML FOR PERITONEAL DIALYSIS: 500.0000 [IU] | INTRAMUSCULAR | Status: DC | PRN

## 2021-10-06 NOTE — Progress Notes (Signed)
Occupational Therapy Session Note  Patient Details  Name: Andrea Stewart MRN: [id number] Date of Birth: August 16, 1946  Today's Date: 10/06/2021 OT Individual Time: 1056-1200 OT Individual Time Calculation (min): 64 min    Short Term Goals: Week 1:  OT Short Term Goal 1 (Week 1): Patient will use adaptive equipment for LB ADLs with mod A OT Short Term Goal 2 (Week 1): Patient will complete 1 step of toileting task OT Short Term Goal 3 (Week 1): Patient will maintain standing balance for 2 minutes in preparation for BADL Task  Skilled Therapeutic Interventions/Progress Updates:  Skilled OT intervention completed with focus ADL retraining, functional transfers and AE education. Pt received seated upright in bed, asleep, easily aroused and agreeable to therapy. Supervision for long sitting > seated EOB, sit > stand using hemi-walker with CGA, then ambulated to regular toilet in bathroom with CGA and min A for incline of bathroom entrance. Cues needed for positioning hemi-walker close to self and safety cues. Pt able to doff pants using L hand with CGA needed for balance, continent episode void only. Pt able to complete anterior pericare and and toileting tasks with CGA this session, and pt attempting to use R hand on grab bar for balance, demonstrating improved functional use of RUE. Pt ambulated to w/c in room using hemi-walker, with min A due to fatigue. Needed min A for bringing hips back into w/c with pt using LLE on leg rest to scoot self back. Pt completed grooming tasks while seated in w/c at sink with improved tolerance of functional use with RUE with hair brushing, shaving face with electric razor, applying lotion and cosmetics. R elbow support provided for reaching higher places on face, however pt with improved initiation and tolerance with RUE use grossly through functional tasks. RN in room to administer pain meds per schedule to get ahead of pain. Pt able to doff shirt with min A for  getting off RUE, then able to apply lotion to sore spots on R shoulder with set up A. Pt attempted donning clean shirt overhead, unable to recall hemi-technique for efficiency, with education re-provided on donning R arm then L arm then over head to decrease pain/demand of R shoulder AROM. Required min A during donning of shirt. While seated in w/c, education provided on sock-aid, and shoe horn for putting on socks and donning shoes, with pt return demonstrating ability to do donn both socks and shoes with min A and cues for technique. Discussed plan to practice while seated with both feet on floor as pt's feet don't touch the ground in her w/c. Pt left seated in w/c with alarm belt on/activated, and all needs in reach at end of session. Pt missed 11 mins of OT intervention due to toileting assist from previous session.  Therapy Documentation Precautions:  Precautions Precautions: None Precaution Comments: WBAT on RLE and RUE, RUE sling for comfort Required Braces or Orthoses: Sling Restrictions Weight Bearing Restrictions: No RUE Weight Bearing: Weight bearing as tolerated LUE Weight Bearing: Weight bearing as tolerated RLE Weight Bearing: Weight bearing as tolerated LLE Weight Bearing: Weight bearing as tolerated Other Position/Activity Restrictions: RUE sling for comfort  Pain: No c/o pain   Therapy/Group: Individual Therapy  Khadija Thier E Chavez Rosol 10/06/2021, 12:37 PM

## 2021-10-06 NOTE — Progress Notes (Signed)
Patient blood sugar this morning is 504. Spoke with Marlowe Shores PA-C and received verbal order to administer ordered Novolog sliding scale insulin with scheduled Novolog 3 units this morning. This nurse will administer insulin when patient has breakfast tray.

## 2021-10-06 NOTE — Progress Notes (Signed)
Physical Therapy Session Note  Patient Details  Name: Andrea Stewart MRN: [id number] Date of Birth: June 26, 1946  Today's Date: 10/06/2021 PT Individual Time: [phone] and 1331-1416 PT Individual Time Calculation (min): 72 min and 45 min PT Missed Time: 15 minutes PT Missed Time Reason: visiting with husband  Short Term Goals: Week 1:  PT Short Term Goal 1 (Week 1): pt will perform bed mobility with min A PT Short Term Goal 2 (Week 1): pt will transfer sit<>stand with LRAD and supervision PT Short Term Goal 3 (Week 1): pt will ambulate 81ft with LRAD and supervision  Skilled Therapeutic Interventions/Progress Updates:   Treatment Session 1 Received pt sitting EOB eating breakfast with RN present at bedside administering medications. Pt hooked up to peritoneal dialysis machine but treatment complete. Contacted dialysis RN twice during session but limited to bedside therapy while waiting. Pt agreeable to PT treatment, and reported pain 8/10 in RUE (premedicated) and reported having a "rough" night last night and appeared lethargic just sitting EOB to eat. Repositioning, rest breaks, and distraction done to reduce pain levels. Session with emphasis on dynamic sitting balance, functional mobility/transfers, dressing, generalized strengthening, functional use of RUE, and improved endurance with activity. Assisted pt with further set up with breakfast tray and encouraged using RUE as gross assist in eating - pt able to do so when eating pears with min cues. Pt then requested to brush teeth - did so with set up assist and min cues for using RUE to hold toothpaste. Doffed dirty shirt with mod A and pt washed face/body with supervision using LUE but required assist from therapist to abduct R shoulder to clean underneath armpit and apply deodorant. Donned clean pull over shirt with mod A threading RUE through first. Donned pants sitting EOB with total A and transferred sit<>stand from EOB without AD and  min A and required total A to pull pants over hips. Pt combed hair and applied lotions using LUE and supervision and donned RUE sling with max A. Pt performed the following exercises sitting EOB with supervision and verbal cues for technique with emphasis on LE strength/ROM: -RLE AAROM heel slides 2x15 -LAQ 2x8 on RLE (decreased ROM) and 2x10 on LLE -hip flexion 2x8 AAROM on RLE and 2x10 AROM on LLE MD present for morning rounds. Pt transferred sit<>supine with mod A for BLE management and scooted to [facility] with min A and use of Trendelenburg bed position. Concluded session with pt semi-reclined in bed, needs within reach, and bed alarm on. RUE supported on pillow for edema management and comfort.   Treatment Session 2 Received pt sitting in WC, pt agreeable to PT treatment, and denied any pain at rest but reported increased R hip pain with mobility (premedicated). Pt requested to go visit with husband - checked therapy schedule and pt's husband not finished with therapy until 2pm. Therapist offered to bring pt to her husband at end of session and pt agreeable. Session with emphasis on functional mobility/transfers, generalized strengthening, gait training, and improved endurance with activity. Pt transported to/from room in [facility] total A for time management purposes. Sit<>stand with hemi-walker and min A x 2 trials and pt ambulated 67ft x 2 trials with hemi-walker and CGA with close WC follow. Pt ambulates with decreased cadence, flexed trunk, antalgic gait pattern, decreased stance time on RLE, and decreased bilateral foot clearance. PT brought pt's husband at end of his therapy session and pt requested to return to room to visit with husband. Therapist set pt/husband  up to have coffee date in room. Concluded session with pt sitting in WC, needs within reach, and seatbelt alarm on. 15 minutes missed of skilled physical therapy due to pt request to visit with husband.   Therapy Documentation Precautions:   Precautions Precautions: None Precaution Comments: WBAT on RLE and RUE, RUE sling for comfort Required Braces or Orthoses: Sling Restrictions Weight Bearing Restrictions: No RUE Weight Bearing: Weight bearing as tolerated LUE Weight Bearing: Weight bearing as tolerated RLE Weight Bearing: Weight bearing as tolerated LLE Weight Bearing: Weight bearing as tolerated Other Position/Activity Restrictions: RUE sling for comfort  Therapy/Group: Individual Therapy Alfonse Alpers PT, DPT   10/06/2021, 6:17 AM

## 2021-10-06 NOTE — Progress Notes (Signed)
Occupational Therapy Weekly Progress Note  Patient Details  Name: Andrea Stewart MRN: [id number] Date of Birth: 1946-09-27  Beginning of progress report period: October 01, 2021 End of progress report period: October 07, 2021   Patient has met 3 of 3 short term goals.  Pt is making steady progress toward LTGs. Pt has progressed with functional transfers, initially requiring mod A stand pivot using hemi-walker to [facility] at bedside, to now ambulating with CGA-min A using hemi-walker to regular toilet in bathroom. Pt has also progressed her tolerance and initiation with using her RUE functionally during self-care tasks, with less guarding demonstrated and less pain reported. Pt has progressed her UB dressing skills from Max A to Min A, however still requires cues for hemi-dressing technique. Has progressed LB dressing from Max A - mod A, with plan to introduce reacher for donning pants in future session. Pt would benefit from continued skilled OT intervention to further increase and promote her independence in self-care tasks, as well as family education for the family that will be available upon d/c to assist pt intermittently.  Patient continues to demonstrate the following deficits: muscle weakness and muscle joint tightness, decreased cardiorespiratoy endurance, and impaired timing and sequencing, decreased coordination, and decreased motor planning and therefore will continue to benefit from skilled OT intervention to enhance overall performance with BADL and Reduce care partner burden.  Patient progressing toward long term goals..  Continue plan of care.  OT Short Term Goals Week 1:  OT Short Term Goal 1 (Week 1): Patient will use adaptive equipment for LB ADLs with mod A OT Short Term Goal 1 - Progress (Week 1): Met OT Short Term Goal 2 (Week 1): Patient will complete 1 step of toileting task OT Short Term Goal 2 - Progress (Week 1): Met OT Short Term Goal 3 (Week 1): Patient will maintain  standing balance for 2 minutes in preparation for BADL Task OT Short Term Goal 3 - Progress (Week 1): Met Week 2:  OT Short Term Goal 1 (Week 2): STG = LTG due to [facility] E Genelle Economou 10/06/2021, 4:18 PM

## 2021-10-06 NOTE — Progress Notes (Signed)
Inpatient Diabetes Program Recommendations  AACE/ADA: New Consensus Statement on Inpatient Glycemic Control (2015)  Target Ranges:  Prepandial:   less than 140 mg/dL      Peak postprandial:   less than 180 mg/dL (1-2 hours)      Critically ill patients:  140 - 180 mg/dL   Lab Results  Component Value Date   GLUCAP 504 (HH) 10/06/2021   HGBA1C 9.7 (H) 09/21/2021    Diabetes history: DM 2 Outpatient Diabetes medications: NPH 20 units qam, 35 units qpm Current orders for Inpatient glycemic control:  Novolog 3 units tid meal coverage Levemir 16 units bid Novolog 0-9 units tid  Nepro Q24 hours  Inpatient Diabetes Program Recommendations:    Note: Glucose >500 this am. Hypoglycemia over treated last night with at least 80 grams of carbs. Treatment should have been 30 grams of carbs, no carb correction given.  NURSING: Please use hypoglycemia protocol when treating hypoglycemia.  Thanks,  Tama Headings RN, MSN, BC-ADM Inpatient Diabetes Coordinator Team Pager [phone] (8a-5p)

## 2021-10-06 NOTE — Progress Notes (Signed)
Patient ID: Andrea Stewart, female   DOB: July 20, 1946, 75 y.o.   MRN: [id number]  [facility] Progress Note   Assessment/ Plan:   1.  Status post right hip and shoulder fractures: Doing better clinically with non-surgical management in the [facility] inpatient rehabilitation. 2. ESRD: Continue peritoneal dialysis Rx with ultrafiltration given significant volume gain during recent hospitalization.  Discussed the importance of fluid restriction (hyponatremia). 3. Anemia: Hemoglobin and hematocrit borderline, continue ESA and trend iron studies. 4. CKD-MBD: Continue renal diet with ongoing lab monitoring-not on phosphorus binder. 5. Nutrition: Continue renal diet/renal multivitamin and oral nutritional supplementation. 6. Hypertension: Blood pressure marginally elevated, continue to monitor with ultrafiltration.  Subjective:   Denies any chest pain or shortness of breath, asks if she can use her own dexcom   Objective:   BP (!) 134/58 (BP Location: Left Arm)   Pulse (!) 52   Temp 97.8 F (36.6 C)   Resp 16   Wt 92.5 kg   SpO2 99%   BMI 41.19 kg/m   Physical Exam: Gen: Comfortably resting in bed- being taken off PD CVS: Pulse regular rhythm, normal rate, S1 and S2 normal Resp: Decreased breath sounds over bases, no distress Abd: Soft, moderately distended, bowel sounds normal Ext: 2+ pitting edema bilaterally  Labs: BMET Recent Labs  Lab 09/30/21 0229 10/01/21 0518 10/03/21 0519  NA 128* 126* 127*  K 3.9 4.1 4.9  CL 96* 93* 93*  CO2 27 26 27   GLUCOSE 323* 335* 470*  BUN 48* 43* 37*  CREATININE 5.84* 5.86* 5.52*  CALCIUM 8.3* 8.2* 8.1*  PHOS  --  4.1 3.6   CBC Recent Labs  Lab 09/30/21 0229 10/01/21 0518 10/03/21 0519  WBC 7.1 6.0 5.2  HGB 9.9* 10.4* 10.1*  HCT 30.0* 31.9* 32.1*  MCV 91.5 93.8 95.8  PLT 307 321 328      Medications:     ALPRAZolam  0.5 mg Oral QHS   amitriptyline  10 mg Oral QHS   aspirin EC  81 mg Oral BID   atorvastatin  20  mg Oral Q supper   darbepoetin (ARANESP) injection - NON-DIALYSIS  25 mcg Subcutaneous Q Thu-1800   feeding supplement (NEPRO CARB STEADY)  237 mL Oral Q24H   furosemide  80 mg Oral Daily   gentamicin cream  1 application Topical Daily   insulin aspart  0-9 Units Subcutaneous TID WC   insulin aspart  3 Units Subcutaneous TID WC   insulin detemir  16 Units Subcutaneous BID   magnesium oxide  800 mg Oral BID   metoprolol succinate  37.5 mg Oral Q breakfast   mometasone-formoterol  2 puff Inhalation BID   multivitamin with minerals  1 tablet Oral Daily   oxyCODONE  5 mg Oral Q6H   pantoprazole  40 mg Oral BID   potassium chloride  40 mEq Oral Daily   spironolactone  12.5 mg Oral Daily   Vitamin D (Ergocalciferol)  50,000 Units Oral Weekly     Elmarie Shiley, MD 10/06/2021, 8:38 AM

## 2021-10-06 NOTE — Progress Notes (Signed)
PROGRESS NOTE   Subjective/Complaints: Pt up at EOB with therapy. Working through pain  but forgets to call for meds and usually is in trouble from a pain standpoint then  ROS: Patient denies fever, rash, sore throat, blurred vision, nausea, vomiting, diarrhea, cough, shortness of breath or chest pain, headache, or mood change.   Objective:   No results found. No results for input(s): WBC, HGB, HCT, PLT in the last 72 hours.  No results for input(s): NA, K, CL, CO2, GLUCOSE, BUN, CREATININE, CALCIUM in the last 72 hours.   Intake/Output Summary (Last 24 hours) at 10/06/2021 1248 Last data filed at 10/06/2021 0824 Gross per 24 hour  Intake 10598 ml  Output 11667 ml  Net -1069 ml        Physical Exam: Vital Signs Blood pressure (!) 134/58, pulse (!) 52, temperature 97.8 F (36.6 C), resp. rate 16, weight 92.5 kg, SpO2 99 %. Constitutional: No distress . Vital signs reviewed. HEENT: NCAT, EOMI, oral membranes moist Neck: supple Cardiovascular: RRR without murmur. No JVD    Respiratory/Chest: CTA Bilaterally without wheezes or rales. Normal effort    GI/Abdomen: BS +, non-tender, non-distended Ext: no clubbing, cyanosis, trace RUE edema Psych: pleasant and cooperative  Skin: dry Musc: RUE edema. Proximal RUE/RLE tenderness. Sling in place Neuro: Alert RUE/RLE: remains Limited due to pain, stable > proximally  Assessment/Plan: 1. Functional deficits which require 3+ hours per day of interdisciplinary therapy in a comprehensive inpatient rehab setting. Physiatrist is providing close team supervision and 24 hour management of active medical problems listed below. Physiatrist and rehab team continue to assess barriers to discharge/monitor patient progress toward functional and medical goals  Care Tool:  Bathing    Body parts bathed by patient: Right arm, Chest, Abdomen, Right upper leg, Left upper leg, Right lower  leg, Left lower leg, Face   Body parts bathed by helper: Right arm, Buttocks, Front perineal area     Bathing assist Assist Level: Moderate Assistance - Patient 50 - 74%     Upper Body Dressing/Undressing Upper body dressing   What is the patient wearing?: Pull over shirt    Upper body assist Assist Level: Minimal Assistance - Patient > 75%    Lower Body Dressing/Undressing Lower body dressing      What is the patient wearing?: Pants     Lower body assist Assist for lower body dressing: Total Assistance - Patient < 25%     Toileting Toileting    Toileting assist Assist for toileting: Minimal Assistance - Patient > 75%     Transfers Chair/bed transfer  Transfers assist     Chair/bed transfer assist level: Minimal Assistance - Patient > 75%     Locomotion Ambulation   Ambulation assist      Assist level: Minimal Assistance - Patient > 75% Assistive device: Walker-hemi Max distance: 20   Walk 10 feet activity   Assist     Assist level: Minimal Assistance - Patient > 75% Assistive device: Walker-hemi   Walk 50 feet activity   Assist Walk 50 feet with 2 turns activity did not occur: Safety/medical concerns         Walk 150  feet activity   Assist Walk 150 feet activity did not occur: Safety/medical concerns         Walk 10 feet on uneven surface  activity   Assist Walk 10 feet on uneven surfaces activity did not occur: Safety/medical concerns (pain, fatigue, weakness, decreased balance)         Wheelchair     Assist Is the patient using a wheelchair?: Yes Type of Wheelchair: Manual Wheelchair activity did not occur: Safety/medical concerns (pain, fatigue, weakness, decreased balance)  Wheelchair assist level: Dependent - Patient 0%      Wheelchair 50 feet with 2 turns activity    Assist    Wheelchair 50 feet with 2 turns activity did not occur: Safety/medical concerns (pain, fatigue, weakness, decreased  balance)       Wheelchair 150 feet activity     Assist  Wheelchair 150 feet activity did not occur: Safety/medical concerns (pain, fatigue, weakness, decreased balance)       Blood pressure (!) 134/58, pulse (!) 52, temperature 97.8 F (36.6 C), resp. rate 16, weight 92.5 kg, SpO2 99 %.    Medical Problem List and Plan: 1.  Nondisplaced right hip greater trochanteric fracture as well as right nondisplaced fracture of the lateral humeral head secondary to fall.  Nonoperative.  Follow-up Dr. Earnestine Leys.  Weightbearing as tolerated.  Shoulder sling for comfort.  -Continue CIR therapies including PT, OT  2.  Antithrombotics: -DVT/anticoagulation:  Mechanical:  Antiembolism stockings, knee (TED hose) Bilateral lower extremities.               -antiplatelet therapy: N/A 3. Postoperative pain: Continue oxycodone q6H [facility]  Relatively controlled with meds on 11/22  -will schedule oxycodone at 0700 and 1200 each day so that she is covered for activity 4. Anxiety: Continue Xanax 0.5 mg nightly, Vistaril 25 mg twice daily as needed, trazodone as needed             -antipsychotic agents: N/A 5. Neuropsych: This patient is capable of making decisions on her own behalf. 6. Skin/Wound Care: Routine skin checks 7. Fluids/Electrolytes/Nutrition: Routine in and outs 8.  End-stage renal disease on PD.  Continue peritoneal dialysis 9.  Anemia of chronic disease.    Hemoglobin 10.1 on 11/19  Continue to monitor 10.  Hypertension, but currently soft. Given increase in metoprolol for heart rate, decreased Aldactone to 12.5 mg daily, Toprol-XL 25 mg daily, Lasix 80 mg daily.    -controlled 11/22 11.  Diabetes mellitus with hyperglycemia.  Hemoglobin A1c 9.7. decrease NovoLog to 3 units 3 times daily, Increase Levemir to 16 units twice daily.    CBG (last 3)  Recent Labs    10/05/21 2130 10/06/21 0619 10/06/21 1141  GLUCAP 337* 504* 149*    11/22 poor control. Levemir just increased yesterday  and showing some improvement today--observe 12.  Hyperlipidemia.  Lipitor 13.  COPD with tobacco abuse.  Continue nebulizers as directed.  Check oxygen saturations every shift.  Provide counsel guards to cessation of nicotine products. 14.  Obesity.  BMI 40.07  Dietary follow-up 15. Tachycardia: increase lopressor to 37.5mg - resolved, continue to monitor TID 16. Hypomagnesemia: will be managed by nephrology while she is inpatient 17. Hyponatremia: likely secondary to volume overload as per nephrology. Improved to 127 on 11/19. Monitor with dialysis.  18.  Sleep disturbance: started Amitriptyline 10mg  HS. Schedule outpatient sleep study  Appears to be improving 18. Disposition: HFU scheduled with Dr. Ranell Patrick   LOS: 6 days A FACE TO  [place] EVALUATION WAS PERFORMED  Meredith Staggers 10/06/2021, 12:48 PM

## 2021-10-07 DIAGNOSIS — G8918 Other acute postprocedural pain: Secondary | ICD-10-CM | POA: Diagnosis not present

## 2021-10-07 DIAGNOSIS — E1129 Type 2 diabetes mellitus with other diabetic kidney complication: Secondary | ICD-10-CM | POA: Diagnosis not present

## 2021-10-07 DIAGNOSIS — R7309 Other abnormal glucose: Secondary | ICD-10-CM | POA: Diagnosis not present

## 2021-10-07 DIAGNOSIS — Z992 Dependence on renal dialysis: Secondary | ICD-10-CM | POA: Diagnosis not present

## 2021-10-07 DIAGNOSIS — I120 Hypertensive chronic kidney disease with stage 5 chronic kidney disease or end stage renal disease: Secondary | ICD-10-CM | POA: Diagnosis not present

## 2021-10-07 DIAGNOSIS — D638 Anemia in other chronic diseases classified elsewhere: Secondary | ICD-10-CM | POA: Diagnosis not present

## 2021-10-07 DIAGNOSIS — S72001D Fracture of unspecified part of neck of right femur, subsequent encounter for closed fracture with routine healing: Secondary | ICD-10-CM | POA: Diagnosis not present

## 2021-10-07 DIAGNOSIS — N186 End stage renal disease: Secondary | ICD-10-CM | POA: Diagnosis not present

## 2021-10-07 DIAGNOSIS — G479 Sleep disorder, unspecified: Secondary | ICD-10-CM | POA: Diagnosis not present

## 2021-10-07 LAB — GLUCOSE, CAPILLARY
Glucose-Capillary: 384 mg/dL — ABNORMAL HIGH (ref 70–99)
Glucose-Capillary: 112 mg/dL — ABNORMAL HIGH (ref 70–99)
Glucose-Capillary: 370 mg/dL — ABNORMAL HIGH (ref 70–99)

## 2021-10-07 MED ORDER — SODIUM CHLORIDE 0.9 % IV SOLN
250.0000 mg | Freq: Every day | INTRAVENOUS | Status: AC
  Administered 2021-10-07 – 2021-10-08 (×2): 250 mg via INTRAVENOUS
  Filled 2021-10-07 (×2): qty 20

## 2021-10-07 MED ORDER — INSULIN DETEMIR 100 UNIT/ML ~~LOC~~ SOLN
20.0000 [IU] | Freq: Once | SUBCUTANEOUS | Status: AC
  Administered 2021-10-07: 20 [IU] via SUBCUTANEOUS
  Filled 2021-10-07: qty 0.2

## 2021-10-07 MED ORDER — INSULIN DETEMIR 100 UNIT/ML ~~LOC~~ SOLN
35.0000 [IU] | Freq: Every evening | SUBCUTANEOUS | Status: DC
Start: 2021-10-07 — End: 2021-10-09
  Administered 2021-10-07 – 2021-10-08 (×2): 35 [IU] via SUBCUTANEOUS
  Filled 2021-10-07 (×3): qty 0.35

## 2021-10-07 MED ORDER — CAMPHOR-MENTHOL 0.5-0.5 % EX LOTN
TOPICAL_LOTION | CUTANEOUS | Status: DC | PRN
  Filled 2021-10-07: qty 222

## 2021-10-07 MED ORDER — INSULIN DETEMIR 100 UNIT/ML ~~LOC~~ SOLN
20.0000 [IU] | Freq: Every day | SUBCUTANEOUS | Status: DC
  Administered 2021-10-08 – 2021-10-09 (×2): 20 [IU] via SUBCUTANEOUS
  Filled 2021-10-07 (×3): qty 0.2

## 2021-10-07 NOTE — Progress Notes (Signed)
Inpatient Diabetes Program Recommendations  AACE/ADA: New Consensus Statement on Inpatient Glycemic Control (2015)  Target Ranges:  Prepandial:   less than 140 mg/dL      Peak postprandial:   less than 180 mg/dL (1-2 hours)      Critically ill patients:  140 - 180 mg/dL   Lab Results  Component Value Date   GLUCAP 384 (H) 10/07/2021   HGBA1C 9.7 (H) 09/21/2021    Latest Reference Range & Units 10/06/21 06:19 10/06/21 11:41 10/06/21 17:24 10/06/21 21:01 10/07/21 05:59  Glucose-Capillary 70 - 99 mg/dL 504 (HH) 149 (H) 114 (H) 262 (H) 384 (H)   Diabetes history: DM 2 Outpatient Diabetes medications: NPH 20 units qam, 35 units qpm Current orders for Inpatient glycemic control:  Novolog 3 units tid meal coverage Levemir 16 units bid Novolog 0-9 units tid  Nepro Q24 hours  Inpatient Diabetes Program Recommendations:    Note: Fasting glucose still in the 380's this am. Taking into account home doses of NPH consider  - Increasing PM dose of Levemir to 20 units  Thanks,  Tama Headings RN, MSN, BC-ADM Inpatient Diabetes Coordinator Team Pager [phone] (8a-5p)

## 2021-10-07 NOTE — Patient Care Conference (Signed)
Inpatient RehabilitationTeam Conference and Plan of Care Update Date: 10/07/2021   Time: 11:43 AM    Patient Name: Andrea Stewart      Medical Record Number: [id number]  Date of Birth: 10/22/1946 Sex: Female         Room/Bed: 4W18C/4W18C-01 Payor Info: Payor: [employer]AGE / Plan: [employer] Must PPO / Product Type: *No Product type* /    Admit Date/Time:  09/30/2021  1:33 PM  Primary Diagnosis:  Closed right hip fracture [facility])  Hospital Problems: Principal Problem:   Closed right hip fracture [facility]) Active Problems:   Intertrochanteric fracture of right hip (HCC)   Acute pain of right shoulder   Sleep disturbance   Labile blood glucose   Anemia of chronic disease   Swelling    Expected Discharge Date: Expected Discharge Date: 10/16/21  Team Members Present: Physician leading conference: Dr. Alysia Penna Social Worker Present: Erlene Quan, BSW Nurse Present: Dorien Chihuahua, RN PT Present: Excell Seltzer, PT OT Present: Other (comment) [facility], OT) SLP Present: Nadara Mode, SLP PPS Coordinator present : Gunnar Fusi, SLP     Current Status/Progress Goal Weekly Team Focus  Bowel/Bladder   Patient is continent of bowel and bladder. LBM 10/05/21.  Patient will remain continent x2.  Offer appropriate toileting q2h and prn.   Swallow/Nutrition/ Hydration             ADL's   Min A UB bathing/dressing due to guarding of RUE/pain, Mod-max LB bathing/dressing, Min A toileting, min A functional transfers with hemi-walker  Supervision transfers, CGA ADLs  PROM/AAROM of RUE, AE education, endurance, functional transfers   Mobility   supine<>sit with HOB elevated and bedrails CGA, transfers with hemi-walker min A, gait 63ft with hemi-walker min A. - continues to be limited by pain/weakness/fatigue  supervision/mod I  functional mobility/transfers, generalized strengthening, dynamic standing balance/coordination, gait training, endurance, and D/C  planning   Communication             Safety/Cognition/ Behavioral Observations            Pain   Patient c/o pain 4/10.  Patient will have a pain level of </= 3.  Assess pain level q shift and prn. Administer pain medication per order. Offer repositioning and ice pack to alleviate pain.   Skin   Skin is intact with peritoneal catheter to abdomen for dialysis.  Skin will remain intact.  Monitor peritoneal catheter site q shift and prn and change gauze dressing as ordered. Assess skin q shift and prn.     Discharge Planning:  spouse was previous caregiver (admitted to CIR), support from son Hilliard Clark. Barriers: Caregiver admitted to CIR, NO insurance for SNF, limited caregivers   Team Discussion: Patient progression limited by pain and fatigue and poor endurance/weakness.  Patient on target to meet rehab goals: yes, currently min assist for upper body care with right UE guarding (sling for comfort). Requires mod - max assist for lower body dressing. Able to transfer using a hemi-walker and ambulate up to 22'. Goals for discharge set for CGA- Supervision overall  *See Care Plan and progress notes for long and short-term goals.   Revisions to Treatment Plan:  N/A   Teaching Needs: Medications, insulin administration (son), safety, precautions, transfers, toileting, etc.  Patient on PD; husband managing PTA.  Current Barriers to Discharge: Decreased caregiver support, Home enviroment access/layout, and Peritoneal Dialysis  Possible Resolutions to Barriers: Family education with son     Medical Summary Current Status: right hip  fx, polytrauma. pain an issue. diabetes poorly controlled. on Pd chronically  Barriers to Discharge: Medical stability   Possible Resolutions to Barriers/Weekly Focus: pain mgt, diabetes regimen adjustment   Continued Need for Acute Rehabilitation Level of Care: The patient requires daily medical management by a physician with specialized training in physical  medicine and rehabilitation for the following reasons: Direction of a multidisciplinary physical rehabilitation program to maximize functional independence : Yes Medical management of patient stability for increased activity during participation in an intensive rehabilitation regime.: Yes Analysis of laboratory values and/or radiology reports with any subsequent need for medication adjustment and/or medical intervention. : Yes   I attest that I was present, lead the team conference, and concur with the assessment and plan of the team.   Dorien Chihuahua B 10/07/2021, 3:28 PM

## 2021-10-07 NOTE — Progress Notes (Signed)
Physical Therapy Session Note  Patient Details  Name: Andrea Stewart MRN: [id number] Date of Birth: [birth year]  Today's Date: 10/07/2021 PT Individual Time: 2376-2831 PT Individual Time Calculation (min): 32 min   Today's Date: 10/07/2021 PT Missed Time: 43 Minutes Missed Time Reason: Other (Comment) (receiving IV for Iron)  Short Term Goals: Week 1:  PT Short Term Goal 1 (Week 1): pt will perform bed mobility with min A PT Short Term Goal 2 (Week 1): pt will transfer sit<>stand with LRAD and supervision PT Short Term Goal 3 (Week 1): pt will ambulate 45ft with LRAD and supervision  Skilled Therapeutic Interventions/Progress Updates:   Received pt supine in bed. Per RN, pt waiting for IV team to come hook up Iron IV - have been unable to get to pt all day. Pt agreeable to PT treatment and reported pain 4/10 in RUE (premedicated). Repositioning, rest breaks, and distraction done to reduce pain levels. Session with emphasis on functional mobility/transfers, generalized strengthening, dynamic standing balance/coordination, gait training, and improved activity tolerance. Pt transferred semi-reclined<>sitting EOB with HOB elevated and use of bedrails with might min A and cues for RUE positioning. Donned RUE sling with max A and pt transferred bed<>WC stand<>pivot with hemi-walker and min A. Pt transported to/from room in [facility] total A for time management purposes. Sit<>stand with hemi-walker and min A and ambulated 32ft x 1 with hemi-walker and CGA/min A. Pt demonstrates antalgic gait pattern, decreased weight shifting to R side, flexed trunk, and decreased bilateral foot clearance. RN arrived reporting IV team was ready for pt and requested pt return to bed. Pt transferred WC<>bed with hemi-walker and min A and sit<>supine with mod A. Scooted to [facility] with +2 assist for time purposes. Concluded session with pt supine in bed, left in care of IV team. 43 minutes missed of skilled physical therapy.    Therapy Documentation Precautions:  Precautions Precautions: None Precaution Comments: WBAT on RLE and RUE, RUE sling for comfort Required Braces or Orthoses: Sling Restrictions Weight Bearing Restrictions: No RUE Weight Bearing: Weight bearing as tolerated LUE Weight Bearing: Weight bearing as tolerated RLE Weight Bearing: Weight bearing as tolerated LLE Weight Bearing: Weight bearing as tolerated Other Position/Activity Restrictions: RUE sling for comfort  Therapy/Group: Individual Therapy Alfonse Alpers PT, DPT   10/07/2021, 6:30 AM

## 2021-10-07 NOTE — Progress Notes (Signed)
PROGRESS NOTE   Subjective/Complaints: No new issues this morning. Received scheduled dose of oxycodone today to help with activity tolerance  ROS: Patient denies fever, rash, sore throat, blurred vision, nausea, vomiting, diarrhea, cough, shortness of breath or chest pain,   headache, or mood change.   Objective:   No results found. No results for input(s): WBC, HGB, HCT, PLT in the last 72 hours.  No results for input(s): NA, K, CL, CO2, GLUCOSE, BUN, CREATININE, CALCIUM in the last 72 hours.   Intake/Output Summary (Last 24 hours) at 10/07/2021 0812 Last data filed at 10/07/2021 0530 Gross per 24 hour  Intake 20487 ml  Output 23019 ml  Net -2532 ml        Physical Exam: Vital Signs Blood pressure (!) 132/98, pulse 67, temperature 97.7 F (36.5 C), resp. rate 18, weight 85.6 kg, SpO2 98 %. Constitutional: No distress . Vital signs reviewed. HEENT: NCAT, EOMI, oral membranes moist Neck: supple Cardiovascular: RRR without murmur. No JVD    Respiratory/Chest: CTA Bilaterally without wheezes or rales. Normal effort    GI/Abdomen: BS +, non-tender, non-distended Ext: no clubbing, cyanosis, RUE trace distal edema Psych: pleasant and cooperative  Skin: dry Musc: RUE edema. Proximal RUE/RLE tenderness. Sling in place Neuro: Alert RUE/RLE: still somewhat limited due to pain, stable > proximally  Assessment/Plan: 1. Functional deficits which require 3+ hours per day of interdisciplinary therapy in a comprehensive inpatient rehab setting. Physiatrist is providing close team supervision and 24 hour management of active medical problems listed below. Physiatrist and rehab team continue to assess barriers to discharge/monitor patient progress toward functional and medical goals  Care Tool:  Bathing    Body parts bathed by patient: Right arm, Chest, Abdomen, Right upper leg, Left upper leg, Right lower leg, Left lower  leg, Face   Body parts bathed by helper: Right arm, Buttocks, Front perineal area     Bathing assist Assist Level: Moderate Assistance - Patient 50 - 74%     Upper Body Dressing/Undressing Upper body dressing   What is the patient wearing?: Pull over shirt    Upper body assist Assist Level: Minimal Assistance - Patient > 75%    Lower Body Dressing/Undressing Lower body dressing      What is the patient wearing?: Pants     Lower body assist Assist for lower body dressing: Total Assistance - Patient < 25%     Toileting Toileting    Toileting assist Assist for toileting: Minimal Assistance - Patient > 75%     Transfers Chair/bed transfer  Transfers assist     Chair/bed transfer assist level: Minimal Assistance - Patient > 75%     Locomotion Ambulation   Ambulation assist      Assist level: Contact Guard/Touching assist Assistive device: Walker-hemi Max distance: 53ft   Walk 10 feet activity   Assist     Assist level: Contact Guard/Touching assist Assistive device: Walker-hemi   Walk 50 feet activity   Assist Walk 50 feet with 2 turns activity did not occur: Safety/medical concerns         Walk 150 feet activity   Assist Walk 150 feet activity did not occur: Safety/medical  concerns         Walk 10 feet on uneven surface  activity   Assist Walk 10 feet on uneven surfaces activity did not occur: Safety/medical concerns (pain, fatigue, weakness, decreased balance)         Wheelchair     Assist Is the patient using a wheelchair?: Yes Type of Wheelchair: Manual Wheelchair activity did not occur: Safety/medical concerns (pain, fatigue, weakness, decreased balance)  Wheelchair assist level: Dependent - Patient 0%      Wheelchair 50 feet with 2 turns activity    Assist    Wheelchair 50 feet with 2 turns activity did not occur: Safety/medical concerns (pain, fatigue, weakness, decreased balance)       Wheelchair 150  feet activity     Assist  Wheelchair 150 feet activity did not occur: Safety/medical concerns (pain, fatigue, weakness, decreased balance)       Blood pressure (!) 132/98, pulse 67, temperature 97.7 F (36.5 C), resp. rate 18, weight 85.6 kg, SpO2 98 %.    Medical Problem List and Plan: 1.  Nondisplaced right hip greater trochanteric fracture as well as right nondisplaced fracture of the lateral humeral head secondary to fall.  Nonoperative.  Follow-up Dr. Earnestine Leys.  Weightbearing as tolerated.  Shoulder sling for comfort.  -Continue CIR therapies including PT, OT . Team conf today 2.  Antithrombotics: -DVT/anticoagulation:  Mechanical:  Antiembolism stockings, knee (TED hose) Bilateral lower extremities.               -antiplatelet therapy: N/A 3. Postoperative pain: Continue oxycodone q6H [facility]  Relatively controlled with meds on 11/22  -have scheduled oxycodone at 0700 and 1200 each day so that she is covered for activity 4. Anxiety: Continue Xanax 0.5 mg nightly, Vistaril 25 mg twice daily as needed, trazodone as needed             -antipsychotic agents: N/A 5. Neuropsych: This patient is capable of making decisions on her own behalf. 6. Skin/Wound Care: Routine skin checks 7. Fluids/Electrolytes/Nutrition: Routine in and outs 8.  End-stage renal disease on PD.  Continue peritoneal dialysis 9.  Anemia of chronic disease.    Hemoglobin 10.1 on 11/19  Continue to monitor 10.  Hypertension, but currently soft. Given increase in metoprolol for heart rate, decreased Aldactone to 12.5 mg daily, Toprol-XL 25 mg daily, Lasix 80 mg daily.    -controlled 11/22 11.  Diabetes mellitus with hyperglycemia.  Hemoglobin A1c 9.7. decrease NovoLog to 3 units 3 times daily, Increase Levemir to 16 units twice daily.    CBG (last 3)  Recent Labs    10/06/21 1724 10/06/21 2101 10/07/21 0559  GLUCAP 114* 262* 384*    11/23 still poor but improving control. Will adjust NPH to 20u qam and 35u  qpm per diabetes coord recs. 12.  Hyperlipidemia.  Lipitor 13.  COPD with tobacco abuse.  Continue nebulizers as directed.  Check oxygen saturations every shift.  Provide counsel guards to cessation of nicotine products. 14.  Obesity.  BMI 40.07  Dietary follow-up 15. Tachycardia: increase lopressor to 37.5mg - resolved, continue to monitor TID 16. Hypomagnesemia: will be managed by nephrology while she is inpatient 17. Hyponatremia: likely secondary to volume overload as per nephrology. Improved to 127 on 11/19. Monitor with dialysis.  18.  Sleep disturbance: started Amitriptyline 10mg  HS. Schedule outpatient sleep study  Appears to be improving 18. Disposition: HFU scheduled with Dr. Ranell Patrick   LOS: 7 days A FACE TO FACE EVALUATION WAS PERFORMED  Andrea Stewart 10/07/2021, 8:12 AM

## 2021-10-07 NOTE — Progress Notes (Signed)
Patient ID: Andrea Stewart, female   DOB: 12-Jan-1946, 75 y.o.   MRN: [id number]  [facility] Progress Note   Assessment/ Plan:   1.  Status post right hip and shoulder fractures: She continues to make improvements with non-surgical management in the [facility] inpatient rehabilitation with ongoing PT/OT. 2. ESRD: Continue current prescription of peritoneal dialysis alternating 4.25 and 2.5% dianeal solution for volume overload-overnight with net -2.1 L fluid balance and continued trend of weight towards her EDW of around 166 pounds (75.3 kg). 3. Anemia: Hemoglobin and hematocrit borderline, continue ESA and trend hemoglobin/hematocrit.  Borderline iron stores, will give Nulecit 250 mg x 2 doses 4. CKD-MBD: Continue renal diet with ongoing lab monitoring-not on phosphorus binder. 5. Nutrition: Continue renal diet/renal multivitamin and oral nutritional supplementation. 6. Hypertension: Blood pressure marginally elevated, continue to monitor with ultrafiltration.  Subjective:   Complains of nausea and some retching after trying to take medications this morning.   Objective:   BP (!) 132/98   Pulse 67   Temp 97.7 F (36.5 C)   Resp 18   Wt 85.6 kg   SpO2 98%   BMI 38.12 kg/m   Physical Exam: Gen: Comfortably sitting up in a wheelchair CVS: Pulse regular rhythm, normal rate, S1 and S2 normal Resp: Decreased breath sounds over bases, no distress Abd: Soft, moderately distended, bowel sounds normal Ext: 1-2+ pitting edema bilaterally  Labs: BMET Recent Labs  Lab 10/01/21 0518 10/03/21 0519  NA 126* 127*  K 4.1 4.9  CL 93* 93*  CO2 26 27  GLUCOSE 335* 470*  BUN 43* 37*  CREATININE 5.86* 5.52*  CALCIUM 8.2* 8.1*  PHOS 4.1 3.6   CBC Recent Labs  Lab 10/01/21 0518 10/03/21 0519  WBC 6.0 5.2  HGB 10.4* 10.1*  HCT 31.9* 32.1*  MCV 93.8 95.8  PLT 321 328      Medications:     ALPRAZolam  0.5 mg Oral QHS   amitriptyline  10 mg Oral QHS   aspirin EC  81  mg Oral BID   atorvastatin  20 mg Oral Q supper   darbepoetin (ARANESP) injection - NON-DIALYSIS  25 mcg Subcutaneous Q Thu-1800   feeding supplement (NEPRO CARB STEADY)  237 mL Oral Q24H   furosemide  80 mg Oral Daily   gentamicin cream  1 application Topical Daily   insulin aspart  0-9 Units Subcutaneous TID WC   insulin aspart  3 Units Subcutaneous TID WC   [START ON 10/08/2021] insulin detemir  20 Units Subcutaneous Daily   insulin detemir  20 Units Subcutaneous Once   insulin detemir  35 Units Subcutaneous QPM   magnesium oxide  800 mg Oral BID   metoprolol succinate  37.5 mg Oral Q breakfast   mometasone-formoterol  2 puff Inhalation BID   multivitamin with minerals  1 tablet Oral Daily   oxyCODONE  5 mg Oral Q6H   oxyCODONE  5 mg Oral BID   pantoprazole  40 mg Oral BID   potassium chloride  40 mEq Oral Daily   spironolactone  12.5 mg Oral Daily   Vitamin D (Ergocalciferol)  50,000 Units Oral Weekly     Elmarie Shiley, MD 10/07/2021, 10:11 AM

## 2021-10-07 NOTE — Progress Notes (Signed)
Patient ID: Andrea Stewart, female   DOB: [birth year], 75 y.o.   MRN: [id number]  Team Conference Report to Patient/Family  Team Conference discussion was reviewed with the patient and caregiver, including goals, any changes in plan of care and target discharge date.  Patient and caregiver express understanding and are in agreement.  The patient has a target discharge date of 10/16/21.  SW spoke to pt son Andrea Stewart). Provided updates to pt step son. Son main concern is the pt being able to do as much for herself as possible due to pt spouse requiring more assistance. Pt step son reports previously pt did not do much for herself, even though able. Pt step son will be here for family education on Friday 11/25 with his siblings for family education. Pt son will participate in family education for pt on same day 2-4 PM  Andrea Stewart 10/07/2021, 12:03 PM

## 2021-10-07 NOTE — Progress Notes (Signed)
Physical Therapy Session Note  Patient Details  Name: Andrea Stewart MRN: [id number] Date of Birth: [birth year]  Today's Date: 10/07/2021 PT Individual Time: 1445-1510 PT Individual Time Calculation (min): 25 min   Short Term Goals: Week 1:  PT Short Term Goal 1 (Week 1): pt will perform bed mobility with min A PT Short Term Goal 2 (Week 1): pt will transfer sit<>stand with LRAD and supervision PT Short Term Goal 3 (Week 1): pt will ambulate 108ft with LRAD and supervision  Skilled Therapeutic Interventions/Progress Updates:    Patient received sitting up in wc, agreeable to PT. She reports that she does have pain, but did not specify where or rate it despite PT questioning. IV team at bedside attempting to place IV without success. Requesting that patient return to bed to allow for use of [place] for IV placement. Patient reporting that this AM her BG dropped. PT discussing with patient s/s of fluctuating BG and impact on mobility/fall risk that it may have. Patient receptive to education. She transferred back to bed with no AD and MinA. Patient requiring ModA to return supine and reposition in the bed. Patient with limited posterior chain engagement with bridging noted when attempting to reposition in the bed. Bed alarm on, call light within reach, RN aware of patients location.   Therapy Documentation Precautions:  Precautions Precautions: None Precaution Comments: WBAT on RLE and RUE, RUE sling for comfort Required Braces or Orthoses: Sling Restrictions Weight Bearing Restrictions: No RUE Weight Bearing: Weight bearing as tolerated LUE Weight Bearing: Weight bearing as tolerated RLE Weight Bearing: Weight bearing as tolerated LLE Weight Bearing: Weight bearing as tolerated Other Position/Activity Restrictions: RUE sling for comfort   Therapy/Group: Individual Therapy  Karoline Caldwell, PT, DPT, CBIS  10/07/2021, 7:39 AM

## 2021-10-07 NOTE — Progress Notes (Signed)
Occupational Therapy Session Note  Patient Details  Name: Andrea Stewart MRN: [id number] Date of Birth: [birth year]  Today's Date: 10/07/2021 OT Individual Time: 9390-3009 OT Individual Time Calculation (min): 56 min    Short Term Goals: Week 2:  OT Short Term Goal 1 (Week 2): STG = LTG due to ELOS  Skilled Therapeutic Interventions/Progress Updates:  Skilled OT intervention completed with focus on ADL retraining and AE education. Pt received semi-supine in bed, lethargic, with respiratory therapist in room administering inhaler. Pt agreeable to session. Bed mobility with min A this session for upright in bed to seated EOB for bringing BLEs to the edge. Pt opted to sponge bathe vs full shower due to fatigue. Able to doff shirt with min-mod A this session due to increased pain, and pt able to recall hemi-dressing technique for RUE unprompted. Completed bathing with min A this session using LH sponge for BLEs, with education provided on technique and use at home. Educated on using R hand on bed, beside R hip for creating space under R arm vs lifting at shoulder to apply deodorant under arm/thread shirt. Pt able to use reacher to donn pants with min-mod A this session with sit > stand at min A using hemi-walker for donning over hips. Education and cues provided throughout for technique on positioning of reacher. Pt with increased pain, wincing and increased respirations this session, reporting being unable to remember if she received pain meds this morning. Pt also presenting with increased confusion during conversation with more irritability, however pt denying feeling "different". Called for RN to address pain. Pt refusing to use LH shoe horn this session when donning shoes, with pt saying "please don't give up on me, don't stop." Requested to toilet, sit > stand using hemi walker with min A, stand pivot with min A to w/c, then stand pivot <> regular toilet in bathroom with same assist. Pt with  continent episode of BM and void. Donned RUE sling for comfort due to expressed pain. Reported being unable to attempt posterior pericare, with toileting tasks at Max A level this session. Pt left seated in w/c, with belt alarm on, notified RN again of need for assist for pain meds, with all needs in reach at end of session.  Therapy Documentation Precautions:  Precautions Precautions: None Precaution Comments: WBAT on RLE and RUE, RUE sling for comfort Required Braces or Orthoses: Sling Restrictions Weight Bearing Restrictions: No RUE Weight Bearing: Weight bearing as tolerated LUE Weight Bearing: Weight bearing as tolerated RLE Weight Bearing: Weight bearing as tolerated LLE Weight Bearing: Weight bearing as tolerated Other Position/Activity Restrictions: RUE sling for comfort  Pain: 5/10 pain in RUE and RLE, with pt confusion about if received pain meds or not. Notified RN twice to visit pt to see if due, donned R arm in sling for comfort, and elevated RLE for comfort   Therapy/Group: Individual Therapy  [place] 10/07/2021, 7:35 AM

## 2021-10-07 NOTE — Progress Notes (Signed)
Occupational Therapy Session Note  Patient Details  Name: Andrea Stewart MRN: [id number] Date of Birth: 07/18/1946  Today's Date: 10/07/2021 OT Individual Time: 3159-4585 OT Individual Time Calculation (min): 44 min    Short Term Goals: Week 2:  OT Short Term Goal 1 (Week 2): STG = LTG due to ELOS  Skilled Therapeutic Interventions/Progress Updates:  Skilled OT intervention completed with focus on functional transfers and AAROM. Pt received seated EOB with NT in room. Pt agreeable to session. Pt with request to toilet. Sit > stand using hemi-walker with CGA, then ambulatory transfer to pt's bathroom in room, with min A. Able to doff pants over hips with min A for R hip due to R hand in sling. Continent episode of void only. Pt sit > stand from regular toilet using hemi-walker and min A, then able to complete front pericare in stance with hemi-walker and min A needed to donn pants over R hip. Ambulatory transfer to w/c in room, with min A, with pt reporting fatigue. Required min A for scooting both hips back into w/c. RN in room alerting pt that she is due for iron through IV, however without having IV, would have to alert team to come start one. Educated pt on how to doff/donn sling, with pt able to assist doffing with min A required. Pt completed AAROM of RUE by using towel on table top with shoulder flexion and shoulder external rotation slides x10 of each completed. Pt completed 5 AAROM bilateral elbow flexion using PVC pipe for AAROM in distal extremity. Pt with wincing and expressed unrated pain, however improved with cues for breathing technique on concentric/eccentric phases and with support/assist under elbow provided. Pt required cues to relax shoulder through [facility] as pt has tendency to shrug shoulder in guarding vs relaxing and letting the assist of the table take the weight of her hand. Education provided on technique of AAROM and purpose through towel slides. Donned sling for comfort  with total A for time. Pt left seated in w/c, with belt alarm on, and all needs in reach at end of session.  Therapy Documentation Precautions:  Precautions Precautions: None Precaution Comments: WBAT on RLE and RUE, RUE sling for comfort Required Braces or Orthoses: Sling Restrictions Weight Bearing Restrictions: No RUE Weight Bearing: Weight bearing as tolerated LUE Weight Bearing: Weight bearing as tolerated RLE Weight Bearing: Weight bearing as tolerated LLE Weight Bearing: Weight bearing as tolerated Other Position/Activity Restrictions: RUE sling for comfort  Pain: 4/10 pain in RUE, promoted AAROM for mobility,donned sling for comfort, no other intervention requested   Therapy/Group: Individual Therapy  Shakima Nisley E Tezra Mahr 10/07/2021, 7:36 AM

## 2021-10-08 DIAGNOSIS — S72001D Fracture of unspecified part of neck of right femur, subsequent encounter for closed fracture with routine healing: Secondary | ICD-10-CM | POA: Diagnosis not present

## 2021-10-08 DIAGNOSIS — R7309 Other abnormal glucose: Secondary | ICD-10-CM | POA: Diagnosis not present

## 2021-10-08 DIAGNOSIS — I120 Hypertensive chronic kidney disease with stage 5 chronic kidney disease or end stage renal disease: Secondary | ICD-10-CM | POA: Diagnosis not present

## 2021-10-08 DIAGNOSIS — Z992 Dependence on renal dialysis: Secondary | ICD-10-CM | POA: Diagnosis not present

## 2021-10-08 DIAGNOSIS — N186 End stage renal disease: Secondary | ICD-10-CM | POA: Diagnosis not present

## 2021-10-08 DIAGNOSIS — E1129 Type 2 diabetes mellitus with other diabetic kidney complication: Secondary | ICD-10-CM | POA: Diagnosis not present

## 2021-10-08 LAB — GLUCOSE, CAPILLARY
Glucose-Capillary: 138 mg/dL — ABNORMAL HIGH (ref 70–99)
Glucose-Capillary: 267 mg/dL — ABNORMAL HIGH (ref 70–99)
Glucose-Capillary: 120 mg/dL — ABNORMAL HIGH (ref 70–99)
Glucose-Capillary: 60 mg/dL — ABNORMAL LOW (ref 70–99)
Glucose-Capillary: 65 mg/dL — ABNORMAL LOW (ref 70–99)
Glucose-Capillary: 76 mg/dL (ref 70–99)
Glucose-Capillary: 138 mg/dL — ABNORMAL HIGH (ref 70–99)

## 2021-10-08 LAB — RENAL FUNCTION PANEL
Albumin: 1.5 g/dL — ABNORMAL LOW (ref 3.5–5.0)
Anion gap: 6 (ref 5–15)
BUN: 27 mg/dL — ABNORMAL HIGH (ref 8–23)
CO2: 27 mmol/L (ref 22–32)
Calcium: 8 mg/dL — ABNORMAL LOW (ref 8.9–10.3)
Chloride: 95 mmol/L — ABNORMAL LOW (ref 98–111)
Creatinine, Ser: 5.32 mg/dL — ABNORMAL HIGH (ref 0.44–1.00)
GFR, Estimated: 8 mL/min — ABNORMAL LOW (ref >60)
Glucose, Bld: 346 mg/dL — ABNORMAL HIGH (ref 70–99)
Phosphorus: 3.4 mg/dL (ref 2.5–4.6)
Potassium: 4.8 mmol/L (ref 3.5–5.1)
Sodium: 128 mmol/L — ABNORMAL LOW (ref 135–145)

## 2021-10-08 LAB — MAGNESIUM: Magnesium: 2.7 mg/dL — ABNORMAL HIGH (ref 1.7–2.4)

## 2021-10-08 MED ORDER — DIPHENHYDRAMINE HCL 25 MG PO CAPS: 25.0000 mg | ORAL_CAPSULE | Freq: Once | ORAL | Status: DC | PRN

## 2021-10-08 MED ORDER — GLUCOSE 40 % PO GEL
1.0000 | Freq: Once | ORAL | Status: AC | PRN
  Administered 2021-10-11: 10:00:00 31 g via ORAL
  Filled 2021-10-08 (×2): qty 1

## 2021-10-08 NOTE — Progress Notes (Signed)
PROGRESS NOTE   Subjective/Complaints: Asking to see husband- Also asking for coffee- asking when "will be moved back to her room"- just woke her up.  LBM yesterday.  Otherwise, denies complaints.  Eating breakfast rapidly.   ROS:  Pt denies SOB, abd pain, CP, N/V/C/D, and vision changes   Objective:   No results found. No results for input(s): WBC, HGB, HCT, PLT in the last 72 hours.  Recent Labs    10/08/21 0515  NA 128*  K 4.8  CL 95*  CO2 27  GLUCOSE 346*  BUN 27*  CREATININE 5.32*  CALCIUM 8.0*     Intake/Output Summary (Last 24 hours) at 10/08/2021 0814 Last data filed at 10/08/2021 0400 Gross per 24 hour  Intake 3159 ml  Output --  Net 3159 ml        Physical Exam: Vital Signs Blood pressure 127/85, pulse (!) 58, temperature 97.6 F (36.4 C), temperature source Oral, resp. rate 18, weight 90.3 kg, SpO2 96 %.    General: awake, alert, appropriate, eating bacon rapidly;  NAD HENT: conjugate gaze; oropharynx moist CV: regular rate; no JVD Pulmonary: CTA B/L; no W/R/R- good air movement GI: soft, NT, ND, (+)BS- PD catheter- OK Psychiatric: appropriate Neurological: alert, but confused as to which room; otherwise appropriate- knew at [facility], why here, etc.  Ext: no clubbing, cyanosis, RUE trace distal edema Psych: pleasant and cooperative  Skin: dry Musc: RUE edema. Proximal RUE/RLE tenderness. Sling in place Neuro: Alert RUE/RLE: still somewhat limited due to pain, stable > proximally  Assessment/Plan: 1. Functional deficits which require 3+ hours per day of interdisciplinary therapy in a comprehensive inpatient rehab setting. Physiatrist is providing close team supervision and 24 hour management of active medical problems listed below. Physiatrist and rehab team continue to assess barriers to discharge/monitor patient progress toward functional and medical goals  Care  Tool:  Bathing    Body parts bathed by patient: Right arm, Chest, Abdomen, Right upper leg, Left upper leg, Right lower leg, Left lower leg, Face   Body parts bathed by helper: Right arm, Buttocks, Front perineal area     Bathing assist Assist Level: Moderate Assistance - Patient 50 - 74%     Upper Body Dressing/Undressing Upper body dressing   What is the patient wearing?: Pull over shirt    Upper body assist Assist Level: Minimal Assistance - Patient > 75%    Lower Body Dressing/Undressing Lower body dressing      What is the patient wearing?: Pants     Lower body assist Assist for lower body dressing: Total Assistance - Patient < 25%     Toileting Toileting    Toileting assist Assist for toileting: Minimal Assistance - Patient > 75%     Transfers Chair/bed transfer  Transfers assist     Chair/bed transfer assist level: Minimal Assistance - Patient > 75%     Locomotion Ambulation   Ambulation assist      Assist level: Contact Guard/Touching assist Assistive device: Walker-hemi Max distance: 35ft   Walk 10 feet activity   Assist     Assist level: Contact Guard/Touching assist Assistive device: Walker-hemi   Walk 50 feet  activity   Assist Walk 50 feet with 2 turns activity did not occur: Safety/medical concerns         Walk 150 feet activity   Assist Walk 150 feet activity did not occur: Safety/medical concerns         Walk 10 feet on uneven surface  activity   Assist Walk 10 feet on uneven surfaces activity did not occur: Safety/medical concerns (pain, fatigue, weakness, decreased balance)         Wheelchair     Assist Is the patient using a wheelchair?: Yes Type of Wheelchair: Manual Wheelchair activity did not occur: Safety/medical concerns (pain, fatigue, weakness, decreased balance)  Wheelchair assist level: Dependent - Patient 0%      Wheelchair 50 feet with 2 turns activity    Assist    Wheelchair  50 feet with 2 turns activity did not occur: Safety/medical concerns (pain, fatigue, weakness, decreased balance)       Wheelchair 150 feet activity     Assist  Wheelchair 150 feet activity did not occur: Safety/medical concerns (pain, fatigue, weakness, decreased balance)       Blood pressure 127/85, pulse (!) 58, temperature 97.6 F (36.4 C), temperature source Oral, resp. rate 18, weight 90.3 kg, SpO2 96 %.    Medical Problem List and Plan: 1.  Nondisplaced right hip greater trochanteric fracture as well as right nondisplaced fracture of the lateral humeral head secondary to fall.  Nonoperative.  Follow-up Dr. Earnestine Leys.  Weightbearing as tolerated.  Shoulder sling for comfort.  Con't PT and OT/CIR 2.  Antithrombotics: -DVT/anticoagulation:  Mechanical:  Antiembolism stockings, knee (TED hose) Bilateral lower extremities.               -antiplatelet therapy: N/A 3. Postoperative pain: Continue oxycodone q6H [facility]  Relatively controlled with meds on 11/22  -have scheduled oxycodone at 0700 and 1200 each day so that she is covered for activity  11/24- pain relatively controlled per pt- con't regimen 4. Anxiety: Continue Xanax 0.5 mg nightly, Vistaril 25 mg twice daily as needed, trazodone as needed             -antipsychotic agents: N/A 5. Neuropsych: This patient is capable of making decisions on her own behalf. 6. Skin/Wound Care: Routine skin checks 7. Fluids/Electrolytes/Nutrition: Routine in and outs 8.  End-stage renal disease on PD.  Continue peritoneal dialysis 9.  Anemia of chronic disease.    Hemoglobin 10.1 on 11/19  Continue to monitor 10.  Hypertension, but currently soft. Given increase in metoprolol for heart rate, decreased Aldactone to 12.5 mg daily, Toprol-XL 25 mg daily, Lasix 80 mg daily.    -controlled 11/22 11.  Diabetes mellitus with hyperglycemia.  Hemoglobin A1c 9.7. decrease NovoLog to 3 units 3 times daily, Increase Levemir to 16 units twice  daily.    CBG (last 3)  Recent Labs    10/07/21 1702 10/07/21 2048 10/08/21 0610  GLUCAP 138* 370* 267*    11/23 still poor but improving control. Will adjust NPH to 20u qam and 35u qpm per diabetes coord recs.  11/24- Bgs slightly better- will wait to change til tomorrow, since just changed yesterday.  12.  Hyperlipidemia.  Lipitor 13.  COPD with tobacco abuse.  Continue nebulizers as directed.  Check oxygen saturations every shift.  Provide counsel guards to cessation of nicotine products. 14.  Obesity.  BMI 40.07  Dietary follow-up 15. Tachycardia: increase lopressor to 37.5mg - resolved, continue to monitor TID 16. Hypomagnesemia: will be managed  by nephrology while she is inpatient 17. Hyponatremia: likely secondary to volume overload as per nephrology. Improved to 127 on 11/19. Monitor with dialysis.   11/24- Na up slightly to 128 today- con't to monitor 18.  Sleep disturbance: started Amitriptyline 10mg  HS. Schedule outpatient sleep study  Appears to be improving 18. Disposition: HFU scheduled with Dr. Ranell Patrick   LOS: 8 days A FACE TO FACE EVALUATION WAS PERFORMED  Andrea Stewart 10/08/2021, 8:14 AM

## 2021-10-08 NOTE — Significant Event (Signed)
Hypoglycemic Event  CBG: 65  Treatment: 4 oz juice/soda  Symptoms: None  Follow-up CBG: Time: 17:50 CBG Result: 76  Possible Reasons for Event: Unknown  Comments/MD notified: PA notified. Order given for glucose gel PRN. Pt also consumed 50% of dinner.     Seward Coran Jacinto Reap

## 2021-10-08 NOTE — Progress Notes (Signed)
Patient presents with duplicate Oxycodone orders. Only Oxy 5 mg every 6 hours administered. Awaiting clarification concerning Oxy 5 mg twice a day. Sanda Linger, LPN

## 2021-10-08 NOTE — Progress Notes (Signed)
Pt complained of worsening abdominal pain and burning sensation at IV site. Notified PA and renal doctor. Order to stop infusion, flushed IV line. Pt states pain is resolved after medication given. Will continue to monitor.   Wille Glaser, LPN

## 2021-10-08 NOTE — Progress Notes (Signed)
Patient ID: Andrea Stewart, female   DOB: 14-Jun-1946, 75 y.o.   MRN: [id number]   [facility] Progress Note   Assessment/ Plan:   1.  Status post right hip and shoulder fractures: She continues to make improvements with non-surgical management in the [facility] inpatient rehabilitation with ongoing PT/OT.  Tentative discharge date set for 12/2. 2. ESRD: Continue current prescription of peritoneal dialysis alternating 4.25 and 2.5% dianeal solution for volume overload-she reports subjective improvement with continued ultrafiltration and charting from this morning regarding overnight ultrafiltration is pending. 3. Anemia: Hemoglobin and hematocrit borderline, continue ESA and trend hemoglobin/hematocrit.  Borderline iron stores, getting Nulecit 250 mg x 2 doses 4. CKD-MBD: Continue renal diet with ongoing lab monitoring-not on phosphorus binder. 5. Nutrition: Continue renal diet/renal multivitamin and oral nutritional supplementation. 6. Hypertension: Blood pressure marginally elevated, continue to monitor with ultrafiltration.  Subjective:   Without acute complaints overnight and taken off peritoneal dialysis early this morning-UF pending charting.   Objective:   BP 127/85 (BP Location: Left Arm)   Pulse (!) 58   Temp 97.6 F (36.4 C) (Oral)   Resp 18   Wt 90.3 kg   SpO2 96%   BMI 40.21 kg/m   Physical Exam: Gen: Comfortably sitting up on the side of her bed eating breakfast CVS: Pulse regular rhythm, normal rate, S1 and S2 normal Resp: Decreased breath sounds over bases, no distress Abd: Soft, moderately distended, bowel sounds normal Ext: 1-2+ pitting edema bilaterally  Labs: BMET Recent Labs  Lab 10/03/21 0519 10/08/21 0515  NA 127* 128*  K 4.9 4.8  CL 93* 95*  CO2 27 27  GLUCOSE 470* 346*  BUN 37* 27*  CREATININE 5.52* 5.32*  CALCIUM 8.1* 8.0*  PHOS 3.6 3.4   CBC Recent Labs  Lab 10/03/21 0519  WBC 5.2  HGB 10.1*  HCT 32.1*  MCV 95.8  PLT 328       Medications:     ALPRAZolam  0.5 mg Oral QHS   amitriptyline  10 mg Oral QHS   aspirin EC  81 mg Oral BID   atorvastatin  20 mg Oral Q supper   darbepoetin (ARANESP) injection - NON-DIALYSIS  25 mcg Subcutaneous Q Thu-1800   feeding supplement (NEPRO CARB STEADY)  237 mL Oral Q24H   furosemide  80 mg Oral Daily   gentamicin cream  1 application Topical Daily   insulin aspart  0-9 Units Subcutaneous TID WC   insulin aspart  3 Units Subcutaneous TID WC   insulin detemir  20 Units Subcutaneous Daily   insulin detemir  35 Units Subcutaneous QPM   magnesium oxide  800 mg Oral BID   metoprolol succinate  37.5 mg Oral Q breakfast   mometasone-formoterol  2 puff Inhalation BID   multivitamin with minerals  1 tablet Oral Daily   oxyCODONE  5 mg Oral Q6H   oxyCODONE  5 mg Oral BID   pantoprazole  40 mg Oral BID   potassium chloride  40 mEq Oral Daily   spironolactone  12.5 mg Oral Daily   Vitamin D (Ergocalciferol)  50,000 Units Oral Weekly     Elmarie Shiley, MD 10/08/2021, 7:51 AM

## 2021-10-08 NOTE — Significant Event (Signed)
Hypoglycemic Event  CBG: 60  Treatment: 4 oz juice/soda  Symptoms: None  Follow-up CBG: Time:1711 CBG Result:65  Possible Reasons for Event: Unknown  Comments/MD notified:NA    Barrett Shell

## 2021-10-09 DIAGNOSIS — I120 Hypertensive chronic kidney disease with stage 5 chronic kidney disease or end stage renal disease: Secondary | ICD-10-CM | POA: Diagnosis not present

## 2021-10-09 DIAGNOSIS — Z992 Dependence on renal dialysis: Secondary | ICD-10-CM | POA: Diagnosis not present

## 2021-10-09 DIAGNOSIS — E1129 Type 2 diabetes mellitus with other diabetic kidney complication: Secondary | ICD-10-CM | POA: Diagnosis not present

## 2021-10-09 DIAGNOSIS — N186 End stage renal disease: Secondary | ICD-10-CM | POA: Diagnosis not present

## 2021-10-09 DIAGNOSIS — R7309 Other abnormal glucose: Secondary | ICD-10-CM | POA: Diagnosis not present

## 2021-10-09 DIAGNOSIS — S72001D Fracture of unspecified part of neck of right femur, subsequent encounter for closed fracture with routine healing: Secondary | ICD-10-CM | POA: Diagnosis not present

## 2021-10-09 LAB — GLUCOSE, CAPILLARY
Glucose-Capillary: 186 mg/dL — ABNORMAL HIGH (ref 70–99)
Glucose-Capillary: 70 mg/dL (ref 70–99)
Glucose-Capillary: 50 mg/dL — ABNORMAL LOW (ref 70–99)
Glucose-Capillary: 62 mg/dL — ABNORMAL LOW (ref 70–99)
Glucose-Capillary: 164 mg/dL — ABNORMAL HIGH (ref 70–99)

## 2021-10-09 MED ORDER — INSULIN DETEMIR 100 UNIT/ML ~~LOC~~ SOLN
18.0000 [IU] | Freq: Every day | SUBCUTANEOUS | Status: DC
Start: 2021-10-10 — End: 2021-10-13
  Administered 2021-10-10 – 2021-10-13 (×4): 18 [IU] via SUBCUTANEOUS
  Filled 2021-10-09 (×4): qty 0.18

## 2021-10-09 MED ORDER — INSULIN DETEMIR 100 UNIT/ML ~~LOC~~ SOLN
34.0000 [IU] | Freq: Every evening | SUBCUTANEOUS | Status: DC
  Administered 2021-10-09 – 2021-10-12 (×4): 34 [IU] via SUBCUTANEOUS
  Filled 2021-10-09 (×5): qty 0.34

## 2021-10-09 NOTE — Progress Notes (Signed)
Physical Therapy Weekly Progress Note  Patient Details  Name: Andrea Stewart MRN: [id number] Date of Birth: 08-11-1946  Beginning of progress report period: October 01, 2021 End of progress report period: October 09, 2021  Patient has met 0 of 3 short term goals. Pt demonstrates slow progress towards long term goals. Pt is currently able to transfer semi-reclined<>sitting EOB with HOB elevated and use of bedrails with close supervision but requires mod A for BLE management to transfer sit<>supine. Pt is able to perform transfers with hemi-walker and CGA/min A and ambulate up to 96f with hemi-walker and CGA. Pt continues to be limited by pain and decreased ROM in RUE, generalized weakness/deconditioning, fatigue, and decreased balance. Pt's family planning to attend family education training on 11/25.  Patient continues to demonstrate the following deficits muscle weakness and muscle joint tightness, decreased cardiorespiratoy endurance, and decreased standing balance, decreased postural control, and decreased balance strategies and therefore will continue to benefit from skilled PT intervention to increase functional independence with mobility.  Patient progressing toward long term goals..  Continue plan of care.  PT Short Term Goals Week 1:  PT Short Term Goal 1 (Week 1): pt will perform bed mobility with min A PT Short Term Goal 1 - Progress (Week 1): Progressing toward goal PT Short Term Goal 2 (Week 1): pt will transfer sit<>stand with LRAD and supervision PT Short Term Goal 2 - Progress (Week 1): Progressing toward goal PT Short Term Goal 3 (Week 1): pt will ambulate 267fwith LRAD and supervision PT Short Term Goal 3 - Progress (Week 1): Progressing toward goal Week 2:  PT Short Term Goal 1 (Week 2): STG=LTG due to LOS  Skilled Therapeutic Interventions/Progress Updates:  Ambulation/gait training;Discharge planning;Functional mobility training;Psychosocial support;Therapeutic  Activities;Balance/vestibular training;Disease management/prevention;Neuromuscular re-education;Skin care/wound management;Therapeutic Exercise;Wheelchair propulsion/positioning;DME/adaptive equipment instruction;Pain management;Splinting/orthotics;UE/LE Strength taining/ROM;Community reintegration;Patient/family education;Stair training;UE/LE Coordination activities   Therapy Documentation Precautions:  Precautions Precautions: None Precaution Comments: WBAT on RLE and RUE, RUE sling for comfort Required Braces or Orthoses: Sling Restrictions Weight Bearing Restrictions: No RUE Weight Bearing: Weight bearing as tolerated LUE Weight Bearing: Weight bearing as tolerated RLE Weight Bearing: Weight bearing as tolerated LLE Weight Bearing: Weight bearing as tolerated Other Position/Activity Restrictions: RUE sling for comfort  Therapy/Group: Individual Therapy AnAlfonse AlpersT, DPT   10/09/2021, 7:43 AM

## 2021-10-09 NOTE — Progress Notes (Signed)
Physical Therapy Session Note  Patient Details  Name: Andrea Stewart MRN: [id number] Date of Birth: January 13, 1946  Today's Date: 10/09/2021 PT Individual Time: 1000-1011 and 1325-1438 PT Individual Time Calculation (min): 11 min  and 73 min Today's Date: 10/09/2021 PT Missed Time: 34 Minutes Missed Time Reason: Patient fatigue  Short Term Goals: Week 1:  PT Short Term Goal 1 (Week 1): pt will perform bed mobility with min A PT Short Term Goal 1 - Progress (Week 1): Progressing toward goal PT Short Term Goal 2 (Week 1): pt will transfer sit<>stand with LRAD and supervision PT Short Term Goal 2 - Progress (Week 1): Progressing toward goal PT Short Term Goal 3 (Week 1): pt will ambulate 72ft with LRAD and supervision PT Short Term Goal 3 - Progress (Week 1): Progressing toward goal Week 2:  PT Short Term Goal 1 (Week 2): STG=LTG due to LOS  Skilled Therapeutic Interventions/Progress Updates:  Treatment Session 1 Received pt semi-reclined in bed asleep. Upon wakening therapist asked pt how she was feeling, to which pt responded with "I don't know" but unable to further clarify why and getting irritated with the more questions therapist asked. Pt reported feeling exhausted asking "why won't they let me rest in here?" Spoke with RN who reported that pt hasn't wanted to get up all morning and requested to use the bedpan rather than getting to bedside commode. Pt also did not want to take morning medications. Pt ultimately requesting to sleep but when therapist attempted to leave room pt stated "are you really leaving me?" Reminded pt of family education session this afternoon and importance of pt participating in afternoon session; pt verbalized understanding. Repositioned RUE on pillow for comfort and edema management and concluded session with pt semi-reclined in bed asleep, needs within reach, and bed alarm on. 34 minutes missed of skilled physical therapy due to fatigue.   Treatment Session  2 Received pt semi-reclined in recliner. Pt agreeable to PT treatment and reported pain 4/10 in RUE (premedicated). Repositioning and rest breaks done to reduce pain levels. Pt's sons arrived shortly after for family education training. Session with emphasis on D/C planning, functional mobility, generalized strengthening, dynamic standing balance/coordination, simulated car transfers, gait training, and endurance. Pt transferred recliner<>WC stand<>pivot with RW and CGA/min A. Pt able to get RUE to RW handgrip without assist with encouragement. Donned shoes with max A and pt transported to/from room in [facility] total A for time management purposes. Pt performed simulated car transfer with RW and min A provided by pt's son. Pt's sons demonstrated excellent understanding of safety awareness (including use of gait belt, WC brake management, and RW safety) and able to provide pt with appropriate cues throughout session. Pt did require cues for safe entry into car and appropriate use of RUE with functional activity. Pt with increased difficulty standing from low car seat surface and difficulty finding place to push up from. Pt then ambulated 85ft on uneven surfaces (ramp) with RW and CGA provided by son - cues to remain within RW BOS. Pt then ambulated 12ft with RW and CGA/close supervision provided by pt's son - 2 standing rest breaks needed as well as maximal encouragement to continue ambulating as pt is very self-limiting. Pt's sons with no further questions for PT and unable to stay for scheduled OT session. Pt reported urge to void and transferred WC<>bedside commode with RW and CGA - cues for turning technique with RW. Pt required mod A for clothing management and able to  void. Pt able to perform peri-care standing with CGA but required max A to pull pants over hips on R side. Bedside commode<>recliner stand<>pivot with RW and CGA. Concluded session with pt sitting in recliner, needs within reach, and chair pad alarm  on. RUE elevated on pillow for edema management and provided pt with ice pack for R shoulder.   Made up 13 minutes missed time from AM session.   Therapy Documentation Precautions:  Precautions Precautions: None Precaution Comments: WBAT on RLE and RUE, RUE sling for comfort Required Braces or Orthoses: Sling Restrictions Weight Bearing Restrictions: No RUE Weight Bearing: Weight bearing as tolerated LUE Weight Bearing: Weight bearing as tolerated RLE Weight Bearing: Weight bearing as tolerated LLE Weight Bearing: Weight bearing as tolerated Other Position/Activity Restrictions: RUE sling for comfort  Therapy/Group: Individual Therapy Alfonse Alpers PT, DPT   10/09/2021, 7:45 AM

## 2021-10-09 NOTE — Progress Notes (Signed)
Spoke to MD concerning duplicate Oxy orders. MD ordered to d/c BID order of oxy. Order d/c'd Sanda Linger, LPN

## 2021-10-09 NOTE — Progress Notes (Addendum)
Patient ID: Andrea Stewart, female   DOB: Nov 16, 1945, 75 y.o.   MRN: [id number]  [facility] Progress Note   Assessment/ Plan:   1.  Status post right hip and shoulder fractures: She continues to make improvements with non-surgical management in the [facility] inpatient rehabilitation with ongoing PT/OT.  Tentative discharge date set for 12/2. 2. ESRD: Continue current prescription of peritoneal dialysis alternating 4.25 and 2.5% dianeal solution for volume overload-she continues to feel better with ultrafiltration (yesterday was 1.5 L) and awaiting to be unhooked this morning to determine how much UF goal she had. 3. Anemia: Hemoglobin and hematocrit borderline, continue ESA and trend hemoglobin/hematocrit.  Developed abdominal pain/IV site burning sensation with intravenous iron administration overnight-treated with Benadryl (not a known allergy for her). 4. CKD-MBD: Continue renal diet with ongoing lab monitoring-not on phosphorus binder. 5. Nutrition: Continue renal diet/renal multivitamin and oral nutritional supplementation. 6. Hypertension: Blood pressure trend improving with volume unloading.  Subjective:   With IV site burning/abdominal pain with IV iron infusion overnight-unclear if allergic reaction but improved with Benadryl (patient also dealing with recurrent hypoglycemia overnight).    Objective:   BP 126/67 (BP Location: Left Arm)   Pulse 60   Temp 98 F (36.7 C)   Resp 18   Wt 90.3 kg   SpO2 96%   BMI 40.21 kg/m   Physical Exam: Gen: Appears comfortable resting in bed, on CCPD CVS: Pulse regular rhythm, normal rate, S1 and S2 normal Resp: Anteriorly clear to auscultation without distinct rales or rhonchi Abd: Soft, moderately distended, bowel sounds normal, hooked up to PD Ext: 1+ bilateral lower extremity edema, pitting  Labs: BMET Recent Labs  Lab 10/03/21 0519 10/08/21 0515  NA 127* 128*  K 4.9 4.8  CL 93* 95*  CO2 27 27  GLUCOSE 470* 346*   BUN 37* 27*  CREATININE 5.52* 5.32*  CALCIUM 8.1* 8.0*  PHOS 3.6 3.4   CBC Recent Labs  Lab 10/03/21 0519  WBC 5.2  HGB 10.1*  HCT 32.1*  MCV 95.8  PLT 328      Medications:     ALPRAZolam  0.5 mg Oral QHS   amitriptyline  10 mg Oral QHS   aspirin EC  81 mg Oral BID   atorvastatin  20 mg Oral Q supper   darbepoetin (ARANESP) injection - NON-DIALYSIS  25 mcg Subcutaneous Q Thu-1800   feeding supplement (NEPRO CARB STEADY)  237 mL Oral Q24H   furosemide  80 mg Oral Daily   gentamicin cream  1 application Topical Daily   insulin aspart  0-9 Units Subcutaneous TID WC   insulin aspart  3 Units Subcutaneous TID WC   insulin detemir  20 Units Subcutaneous Daily   insulin detemir  35 Units Subcutaneous QPM   magnesium oxide  800 mg Oral BID   metoprolol succinate  37.5 mg Oral Q breakfast   mometasone-formoterol  2 puff Inhalation BID   multivitamin with minerals  1 tablet Oral Daily   oxyCODONE  5 mg Oral Q6H   oxyCODONE  5 mg Oral BID   pantoprazole  40 mg Oral BID   potassium chloride  40 mEq Oral Daily   spironolactone  12.5 mg Oral Daily   Vitamin D (Ergocalciferol)  50,000 Units Oral Weekly     Elmarie Shiley, MD 10/09/2021, 8:12 AM

## 2021-10-09 NOTE — Progress Notes (Signed)
Patient ID: Andrea Stewart, female   DOB: Nov 05, 1946, 75 y.o.   MRN: [id number]  This SW covering for primary SW, Erlene Quan.  SW sent HHPT/OT/aide referral to [facility] and waiting on follow-up.   *SW had in depth conversation with pt son with regard to other options for patient. SW to provide Medicaid application. Son confirms he will be here for fam edu this afternoon.   Medicaid application left in room.  Loralee Pacas, MSW, [place] Office: [phone] Cell: [phone] Fax: [phone]

## 2021-10-09 NOTE — Progress Notes (Signed)
PROGRESS NOTE   Subjective/Complaints:  Pt reports she saw husband for short time yesterday- Was getting iron, however IV site was burning- they contacted renal and Ok'd to stop Iron IV.   Per nurse, pt has 2 orders for Oxy q6 hours scheduled and then BID was added- will stop BID since has original scheduled Oxy order.    ROS:   Pt denies SOB, abd pain, CP, N/V/C/D, and vision changes   Objective:   No results found. No results for input(s): WBC, HGB, HCT, PLT in the last 72 hours.  Recent Labs    10/08/21 0515  NA 128*  K 4.8  CL 95*  CO2 27  GLUCOSE 346*  BUN 27*  CREATININE 5.32*  CALCIUM 8.0*     Intake/Output Summary (Last 24 hours) at 10/09/2021 1416 Last data filed at 10/09/2021 0900 Gross per 24 hour  Intake 10367 ml  Output 11085 ml  Net -718 ml        Physical Exam: Vital Signs Blood pressure 126/67, pulse 60, temperature 98 F (36.7 C), resp. rate 18, weight 90.3 kg, SpO2 96 %.    General: awake, alert, appropriate,sitting up in bed eating breakfast; nurse at bedside; iron IV hanging- not infusing;  NAD HENT: conjugate gaze; oropharynx moist CV: regular/borderline bradycardic  rate; no JVD Pulmonary: CTA B/L; no W/R/R- good air movement GI: soft, NT, ND, (+)BS- PD catheter in place.  Psychiatric: appropriate; slightly more interactive Neurological: vague, but appropriate .  Ext: no clubbing, cyanosis, RUE trace distal edema Psych: pleasant and cooperative  Skin: dry Musc: RUE edema. Proximal RUE/RLE tenderness. Sling in place Neuro: Alert RUE/RLE: still somewhat limited due to pain, stable > proximally  Assessment/Plan: 1. Functional deficits which require 3+ hours per day of interdisciplinary therapy in a comprehensive inpatient rehab setting. Physiatrist is providing close team supervision and 24 hour management of active medical problems listed below. Physiatrist and rehab team  continue to assess barriers to discharge/monitor patient progress toward functional and medical goals  Care Tool:  Bathing    Body parts bathed by patient: Right arm, Chest, Abdomen, Right upper leg, Left upper leg, Right lower leg, Left lower leg, Face   Body parts bathed by helper: Right arm, Buttocks, Front perineal area     Bathing assist Assist Level: Moderate Assistance - Patient 50 - 74%     Upper Body Dressing/Undressing Upper body dressing   What is the patient wearing?: Pull over shirt    Upper body assist Assist Level: Minimal Assistance - Patient > 75%    Lower Body Dressing/Undressing Lower body dressing      What is the patient wearing?: Pants     Lower body assist Assist for lower body dressing: Total Assistance - Patient < 25%     Toileting Toileting    Toileting assist Assist for toileting: Minimal Assistance - Patient > 75%     Transfers Chair/bed transfer  Transfers assist     Chair/bed transfer assist level: Minimal Assistance - Patient > 75%     Locomotion Ambulation   Ambulation assist      Assist level: Contact Guard/Touching assist Assistive device: Walker-hemi Max distance: 74ft  Walk 10 feet activity   Assist     Assist level: Contact Guard/Touching assist Assistive device: Walker-hemi   Walk 50 feet activity   Assist Walk 50 feet with 2 turns activity did not occur: Safety/medical concerns         Walk 150 feet activity   Assist Walk 150 feet activity did not occur: Safety/medical concerns         Walk 10 feet on uneven surface  activity   Assist Walk 10 feet on uneven surfaces activity did not occur: Safety/medical concerns (pain, fatigue, weakness, decreased balance)         Wheelchair     Assist Is the patient using a wheelchair?: Yes Type of Wheelchair: Manual Wheelchair activity did not occur: Safety/medical concerns (pain, fatigue, weakness, decreased balance)  Wheelchair assist  level: Dependent - Patient 0%      Wheelchair 50 feet with 2 turns activity    Assist    Wheelchair 50 feet with 2 turns activity did not occur: Safety/medical concerns (pain, fatigue, weakness, decreased balance)       Wheelchair 150 feet activity     Assist  Wheelchair 150 feet activity did not occur: Safety/medical concerns (pain, fatigue, weakness, decreased balance)       Blood pressure 126/67, pulse 60, temperature 98 F (36.7 C), resp. rate 18, weight 90.3 kg, SpO2 96 %.    Medical Problem List and Plan: 1.  Nondisplaced right hip greater trochanteric fracture as well as right nondisplaced fracture of the lateral humeral head secondary to fall.  Nonoperative.  Follow-up Dr. Earnestine Leys.  Weightbearing as tolerated.  Shoulder sling for comfort.  Continue CIR- PT, OT  2.  Antithrombotics: -DVT/anticoagulation:  Mechanical:  Antiembolism stockings, knee (TED hose) Bilateral lower extremities.               -antiplatelet therapy: N/A 3. Postoperative pain: Continue oxycodone q6H [employer]  Relatively controlled with meds on 11/22  -have scheduled oxycodone at 0700 and 1200 each day so that she is covered for activity  11/25- will stop BID scheduled oxy since has q6 hours scheduled.  4. Anxiety: Continue Xanax 0.5 mg nightly, Vistaril 25 mg twice daily as needed, trazodone as needed             -antipsychotic agents: N/A 5. Neuropsych: This patient is capable of making decisions on her own behalf. 6. Skin/Wound Care: Routine skin checks 7. Fluids/Electrolytes/Nutrition: Routine in and outs 8.  End-stage renal disease on PD.  Continue peritoneal dialysis 9.  Anemia of chronic disease.    Hemoglobin 10.1 on 11/19  Continue to monitor 10.  Hypertension, but currently soft. Given increase in metoprolol for heart rate, decreased Aldactone to 12.5 mg daily, Toprol-XL 25 mg daily, Lasix 80 mg daily.    -controlled 11/22  11/25- BP controlled- con't regimen 11.  Diabetes  mellitus with hyperglycemia.  Hemoglobin A1c 9.7. decrease NovoLog to 3 units 3 times daily, Increase Levemir to 16 units twice daily.    CBG (last 3)  Recent Labs    10/08/21 2111 10/09/21 0554 10/09/21 1221  GLUCAP 138* 186* 70    11/23 still poor but improving control. Will adjust NPH to 20u qam and 35u qpm per diabetes coord recs.  11/24- Bgs slightly better- will wait to change til tomorrow, since just changed yesterday.   11/25- CBG's actually have dropped a little low- 60-186- will decrease Levemir to 18 units in AM and 34 units in evening.  12.  Hyperlipidemia.  Lipitor 13.  COPD with tobacco abuse.  Continue nebulizers as directed.  Check oxygen saturations every shift.  Provide counsel guards to cessation of nicotine products. 14.  Obesity.  BMI 40.07  Dietary follow-up 15. Tachycardia: increase lopressor to 37.5mg - resolved, continue to monitor TID 16. Hypomagnesemia: will be managed by nephrology while she is inpatient 17. Hyponatremia: likely secondary to volume overload as per nephrology. Improved to 127 on 11/19. Monitor with dialysis.   11/24- Na up slightly to 128 today- con't to monitor 18.  Sleep disturbance: started Amitriptyline 10mg  HS. Schedule outpatient sleep study  Appears to be improving 18. Disposition: HFU scheduled with Dr. Ranell Patrick   LOS: 9 days A FACE TO FACE EVALUATION WAS PERFORMED  Jeryl Umholtz 10/09/2021, 2:16 PM

## 2021-10-09 NOTE — Progress Notes (Signed)
Hypoglycemic Event  CBG:50  Treatment: 8 oz juice/soda  Symptoms: None  Follow-up CBG: Time:1700 CBG Result:62  Possible Reasons for Event: Unknown  Comments/MD notified:PA Pam notified. Patient eating. Staff will reassess after patient eats dinner.    Andrea Stewart L Andrea Stewart

## 2021-10-09 NOTE — Progress Notes (Signed)
Patient had uneventful night. PD initiated yesterday before 9pm on 10/08/21 and procedure ongoing. No complains of abdominal pain, nausea, or dizziness. Insisted not to be woken up for any medications. Refused AM medication and nutrition supplement. Toileted overnight as requested. Safety maintained.

## 2021-10-09 NOTE — Plan of Care (Signed)
  Problem: Consults Goal: RH GENERAL PATIENT EDUCATION Description: See Patient Education module for education specifics. Outcome: Progressing   Problem: RH BOWEL ELIMINATION Goal: RH STG MANAGE BOWEL WITH ASSISTANCE Description: STG Manage Bowel with mod I Assistance. Outcome: Not Progressing Goal: RH STG MANAGE BOWEL W/MEDICATION W/ASSISTANCE Description: STG Manage Bowel with Medication with mod I  Assistance. Outcome: Not Progressing   Problem: RH BLADDER ELIMINATION Goal: RH STG MANAGE BLADDER WITH ASSISTANCE Description: STG Manage Bladder Without Assistance Outcome: Not Progressing   Problem: RH SAFETY Goal: RH STG ADHERE TO SAFETY PRECAUTIONS W/ASSISTANCE/DEVICE Description: STG Adhere to Safety Precautions With cues Assistance/Device. Outcome: Progressing   Problem: RH PAIN MANAGEMENT Goal: RH STG PAIN MANAGED AT OR BELOW PT'S PAIN GOAL Description: At or below level 4 w prns Outcome: Progressing   Problem: RH KNOWLEDGE DEFICIT GENERAL Goal: RH STG INCREASE KNOWLEDGE OF SELF CARE AFTER HOSPITALIZATION Description: Patient and son(S) will be able to manage peritoneal dialysis, medications, dietary modifications using handouts and educational resources independently Outcome: Not Progressing

## 2021-10-09 NOTE — Progress Notes (Signed)
Occupational Therapy Session Note  Patient Details  Name: Andrea Stewart MRN: [id number] Date of Birth: 01-12-1946  Today's Date: 10/09/2021 OT Individual Time: 3419-3790 OT Individual Time Calculation (min): 50 min  and Today's Date: 10/09/2021 OT Missed Time: 30 Minutes Missed Time Reason: Patient fatigue   Short Term Goals: Week 1:  OT Short Term Goal 1 (Week 1): Patient will use adaptive equipment for LB ADLs with mod A OT Short Term Goal 1 - Progress (Week 1): Met OT Short Term Goal 2 (Week 1): Patient will complete 1 step of toileting task OT Short Term Goal 2 - Progress (Week 1): Met OT Short Term Goal 3 (Week 1): Patient will maintain standing balance for 2 minutes in preparation for BADL Task OT Short Term Goal 3 - Progress (Week 1): Met Week 2:  OT Short Term Goal 1 (Week 2): STG = LTG due to ELOS  Skilled Therapeutic Interventions/Progress Updates:    Pt greeted at time of session 10 minutes late 2/2 previous patient care. Spoke with PT ealier who stated patient's family members unable to stay for OT family education and they were not present when OT entered room for OT session. Pt up in recliner stating "please let my feet down my hip is killing me." OT lowering BLEs in recliner with relief noted, pt requesting pain meds for 8/10 pain and relayed to nursing. Pt declining any out of room activity, ADL, shower transfers, etc. Pt stating she did not feel well, checked VS with following: BP 114/85, HR 99, O2 91% on room air. Discussed with pt regarding DME at home, pt states she does have a shower seat with a back and a BSC with a bucket that she uses at bed side when needed. Pt wanting to change clothes at this time, unable to find desired pajamas so only wanting to change shirt, did so with Min/Mod and extensive cues for hemitechnique. Educated on importance of mobilization for carryover from previous OT to prevent frozen shoulder and improve functional use. Pt did perform oral  hygiene sink level with Set up. Reapplied K-tape to LUE to improve edema and decrease pain, pt aware to request to remove if irritation or pain develops. Pt performing sit <> stands CGA at hemiwalker for w/c cushion placement for comfort. Pt up in chair alarm on call bell in reach, refusing further treatment 2/2 fatigue. Missed 30 mins total of OT.   Therapy Documentation Precautions:  Precautions Precautions: None Precaution Comments: WBAT on RLE and RUE, RUE sling for comfort Required Braces or Orthoses: Sling Restrictions Weight Bearing Restrictions: No RUE Weight Bearing: Weight bearing as tolerated LUE Weight Bearing: Weight bearing as tolerated RLE Weight Bearing: Weight bearing as tolerated LLE Weight Bearing: Weight bearing as tolerated Other Position/Activity Restrictions: RUE sling for comfort     Therapy/Group: Individual Therapy  Viona Gilmore 10/09/2021, 7:29 AM

## 2021-10-10 DIAGNOSIS — S72001A Fracture of unspecified part of neck of right femur, initial encounter for closed fracture: Secondary | ICD-10-CM | POA: Diagnosis not present

## 2021-10-10 LAB — GLUCOSE, CAPILLARY
Glucose-Capillary: 257 mg/dL — ABNORMAL HIGH (ref 70–99)
Glucose-Capillary: 164 mg/dL — ABNORMAL HIGH (ref 70–99)
Glucose-Capillary: 45 mg/dL — ABNORMAL LOW (ref 70–99)
Glucose-Capillary: 80 mg/dL (ref 70–99)
Glucose-Capillary: 116 mg/dL — ABNORMAL HIGH (ref 70–99)

## 2021-10-10 MED ORDER — HYDROCODONE-ACETAMINOPHEN 5-325 MG PO TABS
1.0000 | ORAL_TABLET | Freq: Three times a day (TID) | ORAL | Status: DC
  Administered 2021-10-10 – 2021-10-16 (×15): 1 via ORAL
  Filled 2021-10-10 (×18): qty 1

## 2021-10-10 MED ORDER — MELATONIN 3 MG PO TABS
3.0000 mg | ORAL_TABLET | Freq: Every day | ORAL | Status: DC
  Administered 2021-10-10 – 2021-10-15 (×5): 3 mg via ORAL
  Filled 2021-10-10 (×6): qty 1

## 2021-10-10 NOTE — Plan of Care (Signed)
  Problem: Consults Goal: RH GENERAL PATIENT EDUCATION Description: See Patient Education module for education specifics. Outcome: Progressing   Problem: RH BOWEL ELIMINATION Goal: RH STG MANAGE BOWEL WITH ASSISTANCE Description: STG Manage Bowel with mod I Assistance. Outcome: Not Progressing Goal: RH STG MANAGE BOWEL W/MEDICATION W/ASSISTANCE Description: STG Manage Bowel with Medication with mod I  Assistance. Outcome: Not Progressing   Problem: RH BLADDER ELIMINATION Goal: RH STG MANAGE BLADDER WITH ASSISTANCE Description: STG Manage Bladder Without Assistance Outcome: Progressing   Problem: RH SAFETY Goal: RH STG ADHERE TO SAFETY PRECAUTIONS W/ASSISTANCE/DEVICE Description: STG Adhere to Safety Precautions With cues Assistance/Device. Outcome: Progressing   Problem: RH PAIN MANAGEMENT Goal: RH STG PAIN MANAGED AT OR BELOW PT'S PAIN GOAL Description: At or below level 4 w prns Outcome: Progressing   Problem: RH KNOWLEDGE DEFICIT GENERAL Goal: RH STG INCREASE KNOWLEDGE OF SELF CARE AFTER HOSPITALIZATION Description: Patient and son(S) will be able to manage peritoneal dialysis, medications, dietary modifications using handouts and educational resources independently Outcome: Progressing

## 2021-10-10 NOTE — Progress Notes (Signed)
PROGRESS NOTE   Subjective/Complaints: Patient feels her pain was better controlled with hydrocodone which she was on in the past- will change from oxycodone q6H to hydrocodone TID.  Sleeping poorly at night   ROS:   Pt denies SOB, abd pain, CP, N/V/C/D, and vision changes, +right shoulder pain   Objective:   No results found. No results for input(s): WBC, HGB, HCT, PLT in the last 72 hours.  Recent Labs    10/08/21 0515  NA 128*  K 4.8  CL 95*  CO2 27  GLUCOSE 346*  BUN 27*  CREATININE 5.32*  CALCIUM 8.0*     Intake/Output Summary (Last 24 hours) at 10/10/2021 1511 Last data filed at 10/10/2021 1300 Gross per 24 hour  Intake 1601 ml  Output 11112 ml  Net -9511 ml        Physical Exam: Vital Signs Blood pressure (!) 122/50, pulse 71, temperature 98 F (36.7 C), resp. rate 16, weight 90.3 kg, SpO2 96 %.    General: awake, alert, appropriate,sitting up in bed eating breakfast; nurse at bedside; iron IV hanging- not infusing;  NAD HENT: conjugate gaze; oropharynx moist CV: regular/borderline bradycardic  rate; no JVD Pulmonary: CTA B/L; no W/R/R- good air movement GI: soft, NT, ND, (+)BS- PD catheter in place.  Psychiatric: appropriate; slightly more interactive, anxious Neurological: vague, but appropriate .  Ext: no clubbing, cyanosis, RUE trace distal edema Psych: pleasant and cooperative  Skin: dry Musc: RUE edema. Proximal RUE/RLE tenderness. Sling in place Neuro: Alert RUE/RLE: still somewhat limited due to pain, stable > proximally  Assessment/Plan: 1. Functional deficits which require 3+ hours per day of interdisciplinary therapy in a comprehensive inpatient rehab setting. Physiatrist is providing close team supervision and 24 hour management of active medical problems listed below. Physiatrist and rehab team continue to assess barriers to discharge/monitor patient progress toward  functional and medical goals  Care Tool:  Bathing    Body parts bathed by patient: Right arm, Chest, Abdomen, Right upper leg, Left upper leg, Right lower leg, Left lower leg, Face   Body parts bathed by helper: Right arm, Buttocks, Front perineal area     Bathing assist Assist Level: Moderate Assistance - Patient 50 - 74%     Upper Body Dressing/Undressing Upper body dressing   What is the patient wearing?: Pull over shirt    Upper body assist Assist Level: Minimal Assistance - Patient > 75%    Lower Body Dressing/Undressing Lower body dressing      What is the patient wearing?: Pants     Lower body assist Assist for lower body dressing: Total Assistance - Patient < 25%     Toileting Toileting    Toileting assist Assist for toileting: Minimal Assistance - Patient > 75%     Transfers Chair/bed transfer  Transfers assist     Chair/bed transfer assist level: Contact Guard/Touching assist     Locomotion Ambulation   Ambulation assist      Assist level: Contact Guard/Touching assist Assistive device: Walker-rolling Max distance: 72ft   Walk 10 feet activity   Assist     Assist level: Contact Guard/Touching assist Assistive device: Walker-rolling   Walk  50 feet activity   Assist Walk 50 feet with 2 turns activity did not occur: Safety/medical concerns  Assist level: Contact Guard/Touching assist Assistive device: Walker-rolling    Walk 150 feet activity   Assist Walk 150 feet activity did not occur: Safety/medical concerns         Walk 10 feet on uneven surface  activity   Assist Walk 10 feet on uneven surfaces activity did not occur: Safety/medical concerns (pain, fatigue, weakness, decreased balance)   Assist level: Contact Guard/Touching assist Assistive device: Walker-rolling   Wheelchair     Assist Is the patient using a wheelchair?: Yes Type of Wheelchair: Manual Wheelchair activity did not occur: Safety/medical  concerns (pain, fatigue, weakness, decreased balance)  Wheelchair assist level: Dependent - Patient 0%      Wheelchair 50 feet with 2 turns activity    Assist    Wheelchair 50 feet with 2 turns activity did not occur: Safety/medical concerns (pain, fatigue, weakness, decreased balance)       Wheelchair 150 feet activity     Assist  Wheelchair 150 feet activity did not occur: Safety/medical concerns (pain, fatigue, weakness, decreased balance)       Blood pressure (!) 122/50, pulse 71, temperature 98 F (36.7 C), resp. rate 16, weight 90.3 kg, SpO2 96 %.    Medical Problem List and Plan: 1.  Nondisplaced right hip greater trochanteric fracture as well as right nondisplaced fracture of the lateral humeral head secondary to fall.  Nonoperative.  Follow-up Dr. Earnestine Leys.  Weightbearing as tolerated.  Shoulder sling for comfort.  Continue CIR- PT, OT  2.  Antithrombotics: -DVT/anticoagulation:  Mechanical:  Antiembolism stockings, knee (TED hose) Bilateral lower extremities.               -antiplatelet therapy: N/A 3. Postoperative pain:   Switch from oxycodone to hydrocodone as she has felt more benefit from hydrocodone in the past.  4. Anxiety: Continue Xanax 0.5 mg nightly, Vistaril 25 mg twice daily as needed, trazodone as needed. Please place on Dr. Ferne Coe schedule             -antipsychotic agents: N/A 5. Neuropsych: This patient is capable of making decisions on her own behalf. 6. Skin/Wound Care: Routine skin checks 7. Fluids/Electrolytes/Nutrition: Routine in and outs 8.  End-stage renal disease on PD.  Continue peritoneal dialysis 9.  Anemia of chronic disease.    Hemoglobin 10.1 on 11/19  Continue to monitor 10.  Hypertension, but currently soft. D/c aldactone. Toprol-XL 25 mg daily, Lasix 80 mg daily.   11.  Diabetes mellitus with hyperglycemia.  Hemoglobin A1c 9.7. decrease NovoLog to 3 units 3 times daily, Increase Levemir to 16 units twice daily.     CBG (last 3)  Recent Labs    10/09/21 2057 10/10/21 0556 10/10/21 1134  GLUCAP 164* 257* 164*    11/23 still poor but improving control. Will adjust NPH to 20u qam and 35u qpm per diabetes coord recs.  11/24- Bgs slightly better- will wait to change til tomorrow, since just changed yesterday.   11/25- CBG's actually have dropped a little low- 60-186- will decrease Levemir to 18 units in AM and 34 units in evening.   11/26: d/c coverage since hypoglycemic to 50 12.  Hyperlipidemia.  Lipitor 13.  COPD with tobacco abuse.  Continue nebulizers as directed.  Check oxygen saturations every shift.  Provide counsel guards to cessation of nicotine products. 14.  Obesity.  BMI 40.07  Dietary follow-up 15. Tachycardia:  increase lopressor to 37.5mg - resolved, continue to monitor TID 16. Hypomagnesemia: will be managed by nephrology while she is inpatient 17. Hyponatremia: likely secondary to volume overload as per nephrology. Improved to 127 on 11/19. Monitor with dialysis.   11/24- Na up slightly to 128 today- con't to monitor 18.  Sleep disturbance: started Amitriptyline 10mg  HS. Schedule outpatient sleep study  Appears to be improving 18. Disposition: HFU scheduled with Dr. Ranell Patrick   LOS: 10 days A FACE TO FACE EVALUATION WAS [place] 10/10/2021, 3:11 PM

## 2021-10-10 NOTE — Progress Notes (Signed)
Patient presents with blood glucose of 45. MD called due to recurrent low blood glucose at this time. Standing order for insulin not given. Sanda Linger, LPN

## 2021-10-10 NOTE — Progress Notes (Signed)
Occupational Therapy Session Note  Patient Details  Name: Andrea Stewart MRN: [id number] Date of Birth: 06-Dec-1945  Today's Date: 10/10/2021 OT Individual Time: 1530-1600 OT Individual Time Calculation (min): 30 min  and Today's Date: 10/10/2021 OT Missed Time: 15 Minutes Missed Time Reason: Other (comment) (pt over scheduled/scheduling conflict)   Short Term Goals: Week 1:  OT Short Term Goal 1 (Week 1): Patient will use adaptive equipment for LB ADLs with mod A OT Short Term Goal 1 - Progress (Week 1): Met OT Short Term Goal 2 (Week 1): Patient will complete 1 step of toileting task OT Short Term Goal 2 - Progress (Week 1): Met OT Short Term Goal 3 (Week 1): Patient will maintain standing balance for 2 minutes in preparation for BADL Task OT Short Term Goal 3 - Progress (Week 1): Met Week 2:  OT Short Term Goal 1 (Week 2): STG = LTG due to ELOS  Skilled Therapeutic Interventions/Progress Updates:    OT presented to pt's room at time of session, note there had been a double scheduling of the patient at this time. Returned 15 minutes later and pt in room, finished with previous therapy session. Pt attempting to make phone call, needing therapist assist to maneuver cell phone, encouraged to use RUE for [facility] task of using cell phone. Pt son encouraging pt to use RUE for more tasks and focused OT session on RUE ROM for towel slides, towel squeezes, and grip strengthening to decrease edema. Pt only able to tolerate 5-10 UE exercises before stating "I cant breathe take me back to my room" but O2 and pulse checked and both WNL at 95% and 60 HR. Nursing made aware. Attempted to engage the pt in more UE activity in room but declined. Pt stating at end of session "dont leave me" but made aware of OT schedule for seeing other patients. Left in chair alarm on call bell in reach. Missed 15 mins of OT 2/2 scheduling conflict.    Therapy Documentation Precautions:  Precautions Precautions:  None Precaution Comments: WBAT on RLE and RUE, RUE sling for comfort Required Braces or Orthoses: Sling Restrictions Weight Bearing Restrictions: No RUE Weight Bearing: Weight bearing as tolerated LUE Weight Bearing: Weight bearing as tolerated RLE Weight Bearing: Weight bearing as tolerated LLE Weight Bearing: Weight bearing as tolerated Other Position/Activity Restrictions: RUE sling for comfort     Therapy/Group: Individual Therapy  Viona Gilmore 10/10/2021, 7:28 AM

## 2021-10-10 NOTE — Progress Notes (Signed)
Occupational Therapy Session Note  Patient Details  Name: Andrea Stewart MRN: [id number] Date of Birth: 1946-09-02  Today's Date: 10/10/2021 OT Individual Time: 1418-1530 OT Individual Time Calculation (min): 72 min    Short Term Goals: Week 2:  OT Short Term Goal 1 (Week 2): STG = LTG due to ELOS  Skilled Therapeutic Interventions/Progress Updates:    Pt in bed to start with beginning focus on best technique for supine to sit EOB.  Had her work on rolling to the left side with min instructional cueing and transitioning from left sideying to sitting, both at min assist level.  She was then able to transition back to supine at min assist and sit up from supine, bringing LEs off of the bed and using momentum in her UEs to transition to sitting at supervision level.  She then worked on donning her gripper socks with overall mod assist needed with attempting to cross the LE at the knees.  Therapist then decided it would be best to donn her shoes, so gripper socks were removed and therapist assisted with donning her shoes with max assist.  Ms. Gelles then completed transfer to the toilet with use of the hemiwalker and min assist.  Increased difficulty noted with weightbearing on the RLE and maintaining upright posture.  She was able to complete clothing management and toilet hygiene at min assist level, with assist needed to help pull up pants on the right side.  RW was utilized for transfer out to the sink for washing her hands at min assist level.  Pt reports increased pain in the RUE with use of the walker, however she moves better with the UE support compared to the hemiwalker.  She was rolled down to the ADL apartment via wheelchair where she practiced simulated walk-in shower transfers with use of the hemiwalker.  She needed mod assist for stepping over the simulated shower edge backwards to the shower seat.  Mod demonstrational cueing is needed for sequencing use of the hemiwalker, as she wants  to keep it behind her when making turn to the shower instead of moving it in front of her before space runs out.  Increased anxiety was also noted when trying to step back over the edge with pt verbalizing increased fear and stating "Please don't let me fall".   Therapist gave reassurance and transfer was completed.  Less anxiety and fear noted when stepping out forward and back to the wheelchair.  Feel she will benefit from continued practice with use of the RW instead of hemiwalker.  She was returned to the room where she was left up in the wheelchair with the call button and phone in reach in preparation for another OT session.    Therapy Documentation Precautions:  Precautions Precautions: None Precaution Comments: WBAT on RLE and RUE, RUE sling for comfort Required Braces or Orthoses: Sling Restrictions Weight Bearing Restrictions: No RUE Weight Bearing: Weight bearing as tolerated LUE Weight Bearing: Weight bearing as tolerated RLE Weight Bearing: Weight bearing as tolerated LLE Weight Bearing: Weight bearing as tolerated Other Position/Activity Restrictions: RUE sling for comfort  Pain: Pain Assessment Pain Scale: Faces Pain Score: 0-No pain Faces Pain Scale: Hurts little more Pain Type: Acute pain Pain Location: Shoulder Pain Orientation: Right Pain Descriptors / Indicators: Discomfort Pain Onset: With Activity Pain Intervention(s): Repositioned ADL: See Care Tool Section for some details of mobility and selfcare   Therapy/Group: Individual Therapy  Tawn Fitzner OTR/L 10/10/2021, 4:12 PM

## 2021-10-10 NOTE — Progress Notes (Signed)
Physical Therapy Session Note  Patient Details  Name: Andrea Stewart MRN: [id number] Date of Birth: Apr 01, 1946  Today's Date: 10/10/2021 PT Individual Time: 0805-0908 PT Individual Time Calculation (min): 63 min   Short Term Goals: Week 2:  PT Short Term Goal 1 (Week 2): STG=LTG due to LOS  Skilled Therapeutic Interventions/Progress Updates:   Pt received sitting EOB and agreeable to therapy session. Pt noted to still be connected to peritoneal dialysis - nurse notified. Sitting EOB, donned pants with therapist assist for threading over LEs and then min assist to pull up over R hip - therapist donned shoes total assist. Pt reports preferring hemi-walker (HW) compared to RW. Sit>stand EOB>L HW with CGA. Pt reports need to use bathroom and starts walking towards bathroom forgetting that she is still connected to peritoneal dialysis despite her and therapist talking about it since thearpist entered room. R stand pivot EOB>BSC using HW with CGA for steadying - assist to manage R side of pants. Dialysis nurse present to disconnect patient. Standing with CGA for steadying pt able to perform anterior peri-care with set-up assist and posterior total assist. L stand pivot back to EOB using HW with CGA. Educated pt on using RW as opposed to [facility] for increased balance support and to have support for R UE when standing/ambulating - pt agreeable to trying RW, stating she has just become used to [facility] which is why she likes it more. Pt requesting to perform hygiene tasks at sink. Gait ~52ft EOB>sink using RW with CGA for steadying. Standing at sink performed hand hygiene and oral care with min assist but requires seated rest break due to impaired standing endurance. Noted pt's IV in L arm starting to come out therefore notified nurse who was present to redress. Stand pivot w/c>recliner using RW with CGA. Pt left seated in recliner with needs in reach, chair alarm on, R UE therapeutically positioned on pillows for edema  management, and B LEs elevated.  Therapy Documentation Precautions:  Precautions Precautions: None Precaution Comments: WBAT on RLE and RUE, RUE sling for comfort Required Braces or Orthoses: Sling Restrictions Weight Bearing Restrictions: No RUE Weight Bearing: Weight bearing as tolerated LUE Weight Bearing: Weight bearing as tolerated RLE Weight Bearing: Weight bearing as tolerated LLE Weight Bearing: Weight bearing as tolerated Other Position/Activity Restrictions: RUE sling for comfort   Pain:  Pt reports R shoulder pain when attempting to utilize this UE for functional tasks but unrated - provided rest breaks for pain management.     Therapy/Group: Individual Therapy  Tawana Scale , PT, DPT, NCS, CSRS  10/10/2021, 7:49 AM

## 2021-10-11 DIAGNOSIS — E1129 Type 2 diabetes mellitus with other diabetic kidney complication: Secondary | ICD-10-CM | POA: Diagnosis not present

## 2021-10-11 DIAGNOSIS — Z992 Dependence on renal dialysis: Secondary | ICD-10-CM | POA: Diagnosis not present

## 2021-10-11 DIAGNOSIS — I120 Hypertensive chronic kidney disease with stage 5 chronic kidney disease or end stage renal disease: Secondary | ICD-10-CM | POA: Diagnosis not present

## 2021-10-11 DIAGNOSIS — N186 End stage renal disease: Secondary | ICD-10-CM | POA: Diagnosis not present

## 2021-10-11 DIAGNOSIS — S72001A Fracture of unspecified part of neck of right femur, initial encounter for closed fracture: Secondary | ICD-10-CM | POA: Diagnosis not present

## 2021-10-11 LAB — GLUCOSE, CAPILLARY
Glucose-Capillary: 151 mg/dL — ABNORMAL HIGH (ref 70–99)
Glucose-Capillary: 27 mg/dL — CL (ref 70–99)
Glucose-Capillary: 40 mg/dL — CL (ref 70–99)
Glucose-Capillary: 72 mg/dL (ref 70–99)
Glucose-Capillary: 157 mg/dL — ABNORMAL HIGH (ref 70–99)
Glucose-Capillary: 191 mg/dL — ABNORMAL HIGH (ref 70–99)
Glucose-Capillary: 209 mg/dL — ABNORMAL HIGH (ref 70–99)
Glucose-Capillary: 248 mg/dL — ABNORMAL HIGH (ref 70–99)

## 2021-10-11 MED ORDER — GLUCOSE 40 % PO GEL
ORAL | Status: AC
  Filled 2021-10-11: qty 1

## 2021-10-11 MED ORDER — GLUCOSE 40 % PO GEL
1.0000 | Freq: Once | ORAL | Status: AC | PRN
  Administered 2021-10-11: 10:00:00 31 g via ORAL

## 2021-10-11 NOTE — Progress Notes (Signed)
PROGRESS NOTE   Subjective/Complaints: Called by LPN Tomeka that CBG 27- patient is drowsy, recognizes me, asks son for water at bedside- improved to 40 with dextrose gel, will give another gel and repeat now. Novolog d/ced   ROS:   Pt denies SOB, abd pain, CP, N/V/C/D, and vision changes, +right shoulder pain   Objective:   No results found. No results for input(s): WBC, HGB, HCT, PLT in the last 72 hours.  No results for input(s): NA, K, CL, CO2, GLUCOSE, BUN, CREATININE, CALCIUM in the last 72 hours.    Intake/Output Summary (Last 24 hours) at 10/11/2021 1038 Last data filed at 10/11/2021 0720 Gross per 24 hour  Intake 1280 ml  Output 1169 ml  Net 111 ml        Physical Exam: Vital Signs Blood pressure 125/61, pulse 68, temperature (!) 97.4 F (36.3 C), resp. rate 16, weight 90.3 kg, SpO2 98 %. General: drowsy this morning due to low CBG, still interactive HENT: conjugate gaze; oropharynx moist CV: regular/borderline bradycardic  rate; no JVD Pulmonary: CTA B/L; no W/R/R- good air movement GI: soft, NT, ND, (+)BS- PD catheter in place.  Psychiatric: appropriate; slightly more interactive, anxious Neurological: vague, but appropriate .  Ext: no clubbing, cyanosis, RUE trace distal edema Psych: pleasant and cooperative  Skin: dry Musc: RUE edema. Proximal RUE/RLE tenderness. Sling in place Neuro: Alert RUE/RLE: still somewhat limited due to pain, stable > proximally  Assessment/Plan: 1. Functional deficits which require 3+ hours per day of interdisciplinary therapy in a comprehensive inpatient rehab setting. Physiatrist is providing close team supervision and 24 hour management of active medical problems listed below. Physiatrist and rehab team continue to assess barriers to discharge/monitor patient progress toward functional and medical goals  Care Tool:  Bathing    Body parts bathed by patient:  Right arm, Chest, Abdomen, Right upper leg, Left upper leg, Right lower leg, Left lower leg, Face   Body parts bathed by helper: Right arm, Buttocks, Front perineal area     Bathing assist Assist Level: Moderate Assistance - Patient 50 - 74%     Upper Body Dressing/Undressing Upper body dressing   What is the patient wearing?: Pull over shirt    Upper body assist Assist Level: Minimal Assistance - Patient > 75%    Lower Body Dressing/Undressing Lower body dressing      What is the patient wearing?: Pants     Lower body assist Assist for lower body dressing: Total Assistance - Patient < 25%     Toileting Toileting    Toileting assist Assist for toileting: Minimal Assistance - Patient > 75%     Transfers Chair/bed transfer  Transfers assist     Chair/bed transfer assist level: Contact Guard/Touching assist Chair/bed transfer assistive device: Walker (HW)   Locomotion Ambulation   Ambulation assist      Assist level: Contact Guard/Touching assist Assistive device: Walker-rolling Max distance: 90ft   Walk 10 feet activity   Assist     Assist level: Contact Guard/Touching assist Assistive device: Walker-rolling   Walk 50 feet activity   Assist Walk 50 feet with 2 turns activity did not occur: Safety/medical concerns  Assist level: Contact Guard/Touching assist Assistive device: Walker-rolling    Walk 150 feet activity   Assist Walk 150 feet activity did not occur: Safety/medical concerns         Walk 10 feet on uneven surface  activity   Assist Walk 10 feet on uneven surfaces activity did not occur: Safety/medical concerns (pain, fatigue, weakness, decreased balance)   Assist level: Contact Guard/Touching assist Assistive device: Walker-rolling   Wheelchair     Assist Is the patient using a wheelchair?: Yes Type of Wheelchair: Manual Wheelchair activity did not occur: Safety/medical concerns (pain, fatigue, weakness, decreased  balance)  Wheelchair assist level: Dependent - Patient 0%      Wheelchair 50 feet with 2 turns activity    Assist    Wheelchair 50 feet with 2 turns activity did not occur: Safety/medical concerns (pain, fatigue, weakness, decreased balance)       Wheelchair 150 feet activity     Assist  Wheelchair 150 feet activity did not occur: Safety/medical concerns (pain, fatigue, weakness, decreased balance)       Blood pressure 125/61, pulse 68, temperature (!) 97.4 F (36.3 C), resp. rate 16, weight 90.3 kg, SpO2 98 %.    Medical Problem List and Plan: 1.  Nondisplaced right hip greater trochanteric fracture as well as right nondisplaced fracture of the lateral humeral head secondary to fall.  Nonoperative.  Follow-up Dr. Earnestine Leys.  Weightbearing as tolerated.  Shoulder sling for comfort.  Continue CIR- PT, OT  2.  Antithrombotics: -DVT/anticoagulation:  Mechanical:  Antiembolism stockings, knee (TED hose) Bilateral lower extremities.               -antiplatelet therapy: N/A 3. Postoperative pain:   Switch from oxycodone to hydrocodone as she has felt more benefit from hydrocodone in the past.  4. Anxiety: Continue Xanax 0.5 mg nightly, Vistaril 25 mg twice daily as needed, trazodone as needed. Please place on Dr. Ferne Coe schedule             -antipsychotic agents: N/A 5. Neuropsych: This patient is capable of making decisions on her own behalf. 6. Skin/Wound Care: Routine skin checks 7. Fluids/Electrolytes/Nutrition: Routine in and outs 8.  End-stage renal disease on PD.  Continue peritoneal dialysis 9.  Anemia of chronic disease.    Hemoglobin 10.1 on 11/19  Continue to monitor 10.  Hypertension, but currently soft. D/c aldactone. Toprol-XL 25 mg daily, Lasix 80 mg daily.   11.  Diabetes mellitus with hyperglycemia.  Hemoglobin A1c 9.7. decrease NovoLog to 3 units 3 times daily, Increase Levemir to 16 units twice daily.    CBG (last 3)  Recent Labs     10/11/21 0606 10/11/21 1008 10/11/21 1022  GLUCAP 151* 27* 40*    11/23 still poor but improving control. Will adjust NPH to 20u qam and 35u qpm per diabetes coord recs.  11/24- Bgs slightly better- will wait to change til tomorrow, since just changed yesterday.   11/25- CBG's actually have dropped a little low- 60-186- will decrease Levemir to 18 units in AM and 34 units in evening.   11/26: d/c ISS coverage since hypoglycemic to 50. Keep checking CBGs AC/HS so we can adjust regimen accordingly.  11/27: CBG down to 27: glucose gel given with improvement to 40, another given with increase to 72, d/c Novolog with meals. If CBGs high tomorrow, d/c feeding supplement 12.  Hyperlipidemia.  LDL 53 on 10/18, HDL 60,  VLDL 22, Lipitor, TG 111. Will discuss with son  decreasing Lipitor dose given her fatigue, pain, and brain fog.  13.  COPD with tobacco abuse.  Continue nebulizers as directed.  Check oxygen saturations every shift.  Provide counsel guards to cessation of nicotine products. 14.  Obesity.  BMI 40.07  Dietary follow-up 15. Tachycardia: increase lopressor to 37.5mg - resolved, stable, continue to monitor TID 16. Hypomagnesemia: will be managed by nephrology while she is inpatient 17. Hyponatremia: likely secondary to volume overload as per nephrology. Improved to 127 on 11/19. Monitor with dialysis.   11/24- Na up slightly to 128 today- con't to monitor 18.  Sleep disturbance: started Amitriptyline 10mg  HS. Schedule outpatient sleep study  Appears to be improving 18. Disposition: HFU scheduled with Dr. Ranell Patrick   LOS: 11 days A FACE TO FACE EVALUATION WAS [place] 10/11/2021, 10:38 AM

## 2021-10-11 NOTE — Plan of Care (Signed)
  Problem: Consults Goal: RH GENERAL PATIENT EDUCATION Description: See Patient Education module for education specifics. Outcome: Progressing   Problem: RH BOWEL ELIMINATION Goal: RH STG MANAGE BOWEL WITH ASSISTANCE Description: STG Manage Bowel with mod I Assistance. Outcome: Progressing Goal: RH STG MANAGE BOWEL W/MEDICATION W/ASSISTANCE Description: STG Manage Bowel with Medication with mod I  Assistance. Outcome: Progressing   Problem: RH BLADDER ELIMINATION Goal: RH STG MANAGE BLADDER WITH ASSISTANCE Description: STG Manage Bladder Without Assistance Outcome: Progressing   Problem: RH SAFETY Goal: RH STG ADHERE TO SAFETY PRECAUTIONS W/ASSISTANCE/DEVICE Description: STG Adhere to Safety Precautions With cues Assistance/Device. Outcome: Progressing   Problem: RH PAIN MANAGEMENT Goal: RH STG PAIN MANAGED AT OR BELOW PT'S PAIN GOAL Description: At or below level 4 w prns Outcome: Progressing   Problem: RH KNOWLEDGE DEFICIT GENERAL Goal: RH STG INCREASE KNOWLEDGE OF SELF CARE AFTER HOSPITALIZATION Description: Patient and son(S) will be able to manage peritoneal dialysis, medications, dietary modifications using handouts and educational resources independently Outcome: Progressing

## 2021-10-11 NOTE — Progress Notes (Signed)
Nurse called to patient room due to [facility] concerned patient was overly fatigued. Patient presented with increased fatigued and unable to answer questions of orientation. Patient blood glucose checked and presented at 27. Patient was given PRN glucose gel. CBG rechecked  and presents at 40. MD came to bedside and verbal order placed for 2nd dose of glucose gel. Patient more alert and able to answer orientation questions. Son at bed side. 3rd CBG checked and presents at 72. MD notified and will continue to monitor blood glucose as ordered. Sanda Linger, LPN

## 2021-10-11 NOTE — Progress Notes (Signed)
Patient ID: Andrea Stewart, female   DOB: June 04, 1946, 75 y.o.   MRN: [id number]  [facility] Progress Note   Assessment/ Plan:   1.  Status post right hip and shoulder fractures: She continues to make improvements with non-surgical management in the [facility] inpatient rehabilitation with ongoing PT/OT.  Tentative discharge date set for 12/2. 2. ESRD: Continue current prescription of peritoneal dialysis alternating 4.25% and 2.5% dianeal solution for volume overload-she continues to feel better with ongoing ultrafiltration-overnight -1.2 L. 3. Anemia: Hemoglobin and hematocrit borderline, continue ESA and trend hemoglobin/hematocrit.   4. CKD-MBD: Continue renal diet with ongoing lab monitoring-not on phosphorus binder. 5. Nutrition: Continue renal diet/renal multivitamin and oral nutritional supplementation. 6. Hypertension: Blood pressure trend improving with volume unloading.  Subjective:   Complains of feeling fatigued this morning without any associated chest pain or shortness of breath.   Objective:   BP (!) 140/56 (BP Location: Left Arm)   Pulse (!) 54   Temp 98.1 F (36.7 C)   Resp 16   Wt 90.3 kg   SpO2 98%   BMI 40.21 kg/m   Physical Exam: Gen: Visibly fatigued, sitting up in bed eating breakfast CVS: Pulse regular rhythm, normal rate, S1 and S2 normal Resp: Diminished breath sounds over bases due to poor inspiratory effort, no rales/rhonchi Abd: Soft, moderately distended, bowel sounds normal Ext: With bilateral 1+ pitting lower extremity edema  Labs: BMET Recent Labs  Lab 10/08/21 0515  NA 128*  K 4.8  CL 95*  CO2 27  GLUCOSE 346*  BUN 27*  CREATININE 5.32*  CALCIUM 8.0*  PHOS 3.4   CBC No results for input(s): WBC, NEUTROABS, HGB, HCT, MCV, PLT in the last 168 hours.     Medications:     ALPRAZolam  0.5 mg Oral QHS   amitriptyline  10 mg Oral QHS   aspirin EC  81 mg Oral BID   atorvastatin  20 mg Oral Q supper   darbepoetin (ARANESP)  injection - NON-DIALYSIS  25 mcg Subcutaneous Q Thu-1800   feeding supplement (NEPRO CARB STEADY)  237 mL Oral Q24H   furosemide  80 mg Oral Daily   gentamicin cream  1 application Topical Daily   HYDROcodone-acetaminophen  1 tablet Oral TID   insulin aspart  3 Units Subcutaneous TID WC   insulin detemir  18 Units Subcutaneous Daily   insulin detemir  34 Units Subcutaneous QPM   magnesium oxide  800 mg Oral BID   melatonin  3 mg Oral QHS   metoprolol succinate  37.5 mg Oral Q breakfast   mometasone-formoterol  2 puff Inhalation BID   multivitamin with minerals  1 tablet Oral Daily   pantoprazole  40 mg Oral BID   potassium chloride  40 mEq Oral Daily   Vitamin D (Ergocalciferol)  50,000 Units Oral Weekly     Elmarie Shiley, MD 10/11/2021, 8:29 AM

## 2021-10-12 DIAGNOSIS — R7309 Other abnormal glucose: Secondary | ICD-10-CM | POA: Diagnosis not present

## 2021-10-12 DIAGNOSIS — E1129 Type 2 diabetes mellitus with other diabetic kidney complication: Secondary | ICD-10-CM | POA: Diagnosis not present

## 2021-10-12 DIAGNOSIS — S72001D Fracture of unspecified part of neck of right femur, subsequent encounter for closed fracture with routine healing: Secondary | ICD-10-CM | POA: Diagnosis not present

## 2021-10-12 DIAGNOSIS — I120 Hypertensive chronic kidney disease with stage 5 chronic kidney disease or end stage renal disease: Secondary | ICD-10-CM | POA: Diagnosis not present

## 2021-10-12 DIAGNOSIS — N186 End stage renal disease: Secondary | ICD-10-CM | POA: Diagnosis not present

## 2021-10-12 DIAGNOSIS — Z992 Dependence on renal dialysis: Secondary | ICD-10-CM | POA: Diagnosis not present

## 2021-10-12 LAB — CBC
HCT: 35.3 % — ABNORMAL LOW (ref 36.0–46.0)
Hemoglobin: 11.1 g/dL — ABNORMAL LOW (ref 12.0–15.0)
MCH: 30.6 pg (ref 26.0–34.0)
MCHC: 31.4 g/dL (ref 30.0–36.0)
MCV: 97.2 fL (ref 80.0–100.0)
Platelets: 268 10*3/uL (ref 150–400)
RBC: 3.63 MIL/uL — ABNORMAL LOW (ref 3.87–5.11)
RDW: 14.8 % (ref 11.5–15.5)
WBC: 5.9 10*3/uL (ref 4.0–10.5)
nRBC: 0 % (ref 0.0–0.2)

## 2021-10-12 LAB — GLUCOSE, CAPILLARY
Glucose-Capillary: 249 mg/dL — ABNORMAL HIGH (ref 70–99)
Glucose-Capillary: 115 mg/dL — ABNORMAL HIGH (ref 70–99)
Glucose-Capillary: 51 mg/dL — ABNORMAL LOW (ref 70–99)
Glucose-Capillary: 68 mg/dL — ABNORMAL LOW (ref 70–99)
Glucose-Capillary: 111 mg/dL — ABNORMAL HIGH (ref 70–99)
Glucose-Capillary: 251 mg/dL — ABNORMAL HIGH (ref 70–99)

## 2021-10-12 LAB — BASIC METABOLIC PANEL
Anion gap: 5 (ref 5–15)
BUN: 25 mg/dL — ABNORMAL HIGH (ref 8–23)
CO2: 30 mmol/L (ref 22–32)
Calcium: 8.2 mg/dL — ABNORMAL LOW (ref 8.9–10.3)
Chloride: 93 mmol/L — ABNORMAL LOW (ref 98–111)
Creatinine, Ser: 5.52 mg/dL — ABNORMAL HIGH (ref 0.44–1.00)
GFR, Estimated: 8 mL/min — ABNORMAL LOW (ref >60)
Glucose, Bld: 284 mg/dL — ABNORMAL HIGH (ref 70–99)
Potassium: 5 mmol/L (ref 3.5–5.1)
Sodium: 128 mmol/L — ABNORMAL LOW (ref 135–145)

## 2021-10-12 NOTE — Progress Notes (Signed)
PROGRESS NOTE   Subjective/Complaints:  Pt reports she's tired and PD dialysis people taking her off PD are "interrupting her sleep".   Won't discuss any other issues, except she's "OK" except wanting to sleep.    ROS:   Pt denies SOB, abd pain, CP, N/V/C/D, and vision changes   Objective:   No results found. Recent Labs    10/12/21 0605  WBC 5.9  HGB 11.1*  HCT 35.3*  PLT 268    Recent Labs    10/12/21 0605  NA 128*  K 5.0  CL 93*  CO2 30  GLUCOSE 284*  BUN 25*  CREATININE 5.52*  CALCIUM 8.2*      Intake/Output Summary (Last 24 hours) at 10/12/2021 1035 Last data filed at 10/12/2021 1021 Gross per 24 hour  Intake 480 ml  Output --  Net 480 ml        Physical Exam: Vital Signs Blood pressure (!) 136/52, pulse (!) 109, temperature 98.1 F (36.7 C), temperature source Oral, resp. rate 20, weight 90.3 kg, SpO2 98 %.    General: awake, alert, but kept asking to go to sleep- not nice to PD people about their role; NAD HENT: conjugate gaze; oropharynx moist CV: tachycardic  rate; no JVD Pulmonary: slightly coarse, but no W/R/R GI: soft, NT, ND, (+)BS- (+) PD catheter Psychiatric: irritable Neurological: vague, but appropriate .  Ext: no clubbing, cyanosis, RUE trace distal edema Psych: pleasant and cooperative  Skin: dry Musc: RUE edema. Proximal RUE/RLE tenderness. Sling in place Neuro: Alert RUE/RLE: still somewhat limited due to pain, stable > proximally  Assessment/Plan: 1. Functional deficits which require 3+ hours per day of interdisciplinary therapy in a comprehensive inpatient rehab setting. Physiatrist is providing close team supervision and 24 hour management of active medical problems listed below. Physiatrist and rehab team continue to assess barriers to discharge/monitor patient progress toward functional and medical goals  Care Tool:  Bathing    Body parts bathed by  patient: Right arm, Chest, Abdomen, Right upper leg, Left upper leg, Right lower leg, Left lower leg, Face   Body parts bathed by helper: Right arm, Buttocks, Front perineal area     Bathing assist Assist Level: Moderate Assistance - Patient 50 - 74%     Upper Body Dressing/Undressing Upper body dressing   What is the patient wearing?: Pull over shirt    Upper body assist Assist Level: Minimal Assistance - Patient > 75%    Lower Body Dressing/Undressing Lower body dressing      What is the patient wearing?: Pants     Lower body assist Assist for lower body dressing: Total Assistance - Patient < 25%     Toileting Toileting    Toileting assist Assist for toileting: Minimal Assistance - Patient > 75%     Transfers Chair/bed transfer  Transfers assist     Chair/bed transfer assist level: Contact Guard/Touching assist Chair/bed transfer assistive device: Walker (HW)   Locomotion Ambulation   Ambulation assist      Assist level: Contact Guard/Touching assist Assistive device: Walker-rolling Max distance: 60ft   Walk 10 feet activity   Assist     Assist level: Contact Guard/Touching  assist Assistive device: Walker-rolling   Walk 50 feet activity   Assist Walk 50 feet with 2 turns activity did not occur: Safety/medical concerns  Assist level: Contact Guard/Touching assist Assistive device: Walker-rolling    Walk 150 feet activity   Assist Walk 150 feet activity did not occur: Safety/medical concerns         Walk 10 feet on uneven surface  activity   Assist Walk 10 feet on uneven surfaces activity did not occur: Safety/medical concerns (pain, fatigue, weakness, decreased balance)   Assist level: Contact Guard/Touching assist Assistive device: Walker-rolling   Wheelchair     Assist Is the patient using a wheelchair?: Yes Type of Wheelchair: Manual Wheelchair activity did not occur: Safety/medical concerns (pain, fatigue, weakness,  decreased balance)  Wheelchair assist level: Dependent - Patient 0%      Wheelchair 50 feet with 2 turns activity    Assist    Wheelchair 50 feet with 2 turns activity did not occur: Safety/medical concerns (pain, fatigue, weakness, decreased balance)       Wheelchair 150 feet activity     Assist  Wheelchair 150 feet activity did not occur: Safety/medical concerns (pain, fatigue, weakness, decreased balance)       Blood pressure (!) 136/52, pulse (!) 109, temperature 98.1 F (36.7 C), temperature source Oral, resp. rate 20, weight 90.3 kg, SpO2 98 %.    Medical Problem List and Plan: 1.  Nondisplaced right hip greater trochanteric fracture as well as right nondisplaced fracture of the lateral humeral head secondary to fall.  Nonoperative.  Follow-up Dr. Earnestine Leys.  Weightbearing as tolerated.  Shoulder sling for comfort.  Con't CIR- PT and OT  2.  Antithrombotics: -DVT/anticoagulation:  Mechanical:  Antiembolism stockings, knee (TED hose) Bilateral lower extremities.               -antiplatelet therapy: N/A 3. Postoperative pain:   Switch from oxycodone to hydrocodone as she has felt more benefit from hydrocodone in the past.  4. Anxiety: Continue Xanax 0.5 mg nightly, Vistaril 25 mg twice daily as needed, trazodone as needed. Please place on Dr. Ferne Coe schedule             -antipsychotic agents: N/A 5. Neuropsych: This patient is capable of making decisions on her own behalf. 6. Skin/Wound Care: Routine skin checks 7. Fluids/Electrolytes/Nutrition: Routine in and outs 8.  End-stage renal disease on PD.  Continue peritoneal dialysis 9.  Anemia of chronic disease.    Hemoglobin 10.1 on 11/19  11/28- Hb 11.1- doing better- con't to monitor  Continue to monitor 10.  Hypertension, but currently soft. D/c aldactone. Toprol-XL 25 mg daily, Lasix 80 mg daily.    11/28- BP controlled overall- con't regimen 11.  Diabetes mellitus with hyperglycemia.  Hemoglobin  A1c 9.7. decrease NovoLog to 3 units 3 times daily, Increase Levemir to 16 units twice daily.    CBG (last 3)  Recent Labs    10/11/21 1800 10/11/21 2058 10/12/21 0603  GLUCAP 209* 248* 249*    11/23 still poor but improving control. Will adjust NPH to 20u qam and 35u qpm per diabetes coord recs.  11/24- Bgs slightly better- will wait to change til tomorrow, since just changed yesterday.   11/25- CBG's actually have dropped a little low- 60-186- will decrease Levemir to 18 units in AM and 34 units in evening.   11/26: d/c ISS coverage since hypoglycemic to 50. Keep checking CBGs AC/HS so we can adjust regimen accordingly.  11/27: CBG  down to 27: glucose gel given with improvement to 40, another given with increase to 72, d/c Novolog with meals. If CBGs high tomorrow, d/c feeding supplement  11/28- CBG's high- mid 200s- will d/c supplements/Nepro 1x/day.  12.  Hyperlipidemia.  LDL 53 on 10/18, HDL 60,  VLDL 22, Lipitor, TG 111. Will discuss with son decreasing Lipitor dose given her fatigue, pain, and brain fog.  13.  COPD with tobacco abuse.  Continue nebulizers as directed.  Check oxygen saturations every shift.  Provide counsel guards to cessation of nicotine products. 14.  Obesity.  BMI 40.07  Dietary follow-up 15. Tachycardia: increase lopressor to 37.5mg - resolved, stable, continue to monitor TID  11/28- HR 109 this AM- if continues, might need additional B blocker.  16. Hypomagnesemia: will be managed by nephrology while she is inpatient 17. Hyponatremia: likely secondary to volume overload as per nephrology. Improved to 127 on 11/19. Monitor with dialysis.   11/24- Na up slightly to 128 today- con't to monitor  11/28- Na 128- con't to monitor 18.  Sleep disturbance: started Amitriptyline 10mg  HS. Schedule outpatient sleep study  Appears to be improving 18. Disposition: HFU scheduled with Dr. Ranell Patrick   LOS: 12 days A FACE TO FACE EVALUATION WAS PERFORMED  Charlie Char 10/12/2021, 10:35 AM

## 2021-10-12 NOTE — Progress Notes (Signed)
Physical Therapy Session Note  Patient Details  Name: Andrea Stewart MRN: [id number] Date of Birth: Apr 02, 1946  Today's Date: 10/12/2021 PT Individual Time: 1100-1154 PT Individual Time Calculation (min): 54 min   Short Term Goals: Week 1:  PT Short Term Goal 1 (Week 1): pt will perform bed mobility with min A PT Short Term Goal 1 - Progress (Week 1): Progressing toward goal PT Short Term Goal 2 (Week 1): pt will transfer sit<>stand with LRAD and supervision PT Short Term Goal 2 - Progress (Week 1): Progressing toward goal PT Short Term Goal 3 (Week 1): pt will ambulate 58ft with LRAD and supervision PT Short Term Goal 3 - Progress (Week 1): Progressing toward goal Week 2:  PT Short Term Goal 1 (Week 2): STG=LTG due to LOS  Skilled Therapeutic Interventions/Progress Updates:   Received pt sitting in WC, pt agreeable to PT treatment, and reported pain 4/10 in RUE. RN notified and present to administer pain medication. Repositioning and rest breaks done to reduce pain levels. Of note, pt with mild confusion during session thinking that standard chair in room was her "potty chair". Session with emphasis on functional mobility/transfers, generalized strengthening, dynamic standing balance/coordination, gait training, and improved activity tolerance. Pt transported to/from room in [facility] total A for time management and energy conservation purposes. Sit<>stands with RW and CGA throughout session with cues to place RUE on RW and LUE on WC armrest (poor carry over of technique). Pt ambulated 100ft x1, 65ft x1, and 46ft x1 with RW and CGA/close supervision with increased time. Pt required frequent and extended rest breaks throughout due to fatigue/SOB. Pt ambulates with decreased cadence, narrow BOS, flexed trunk/downward gaze, and decreased weight shifting to RLE - required cues for upright posture and to increase RLE stride length. Pt then performed BLE strengthening on Nustep at workload 3 for 5 minutes  for a total of 69 steps with emphasis on cardiovascular endurance and LE strength/ROM. Pt able to grasp handgrip with RUE and passively move through R shoulder flexion/extension x 4 reps - cues for position to stop in to avoid excessive pain/stretch. Stand<>pivot Nustep<>WC with RW and CGA. Concluded session with pt sitting in WC, needs within reach, and chair pad alarm on.   Therapy Documentation Precautions:  Precautions Precautions: None Precaution Comments: WBAT on RLE and RUE, RUE sling for comfort Required Braces or Orthoses: Sling Restrictions Weight Bearing Restrictions: No RUE Weight Bearing: Weight bearing as tolerated LUE Weight Bearing: Weight bearing as tolerated RLE Weight Bearing: Weight bearing as tolerated LLE Weight Bearing: Weight bearing as tolerated Other Position/Activity Restrictions: RUE sling for comfort  Therapy/Group: Individual Therapy Alfonse Alpers PT, DPT   10/12/2021, 7:36 AM

## 2021-10-12 NOTE — Progress Notes (Signed)
Patient ID: Andrea Stewart, female   DOB: Jun 18, 1946, 75 y.o.   MRN: [id number]  Pt [name] sent to [facility]

## 2021-10-12 NOTE — Progress Notes (Signed)
**Andrea Andrea** Andrea Andrea  Assessment/ Plan:  Dialysis Orders: Center: [facility]  on [facility] . EDW 73 kg, 5 exchanges, 2 liters fill, fill time 10 min, dwell time 1.5 hrs, drain time 20 minutes.   # Status post right hip and shoulder fractures: She continues to make improvements with non-surgical management in the [facility] inpatient rehabilitation with ongoing PT/OT.  Tentative discharge date set for 12/2.  #. ESRD: Continue current prescription of peritoneal dialysis alternating 4.25% and 2.5% dianeal solution for volume overload-she continues to feel better with ongoing ultrafiltration. D/w PD nurse.  Net UF around 1.2 L.  #. Anemia: Hemoglobin at goal, continue ESA and trend hemoglobin/hematocrit.    # CKD-MBD: Continue renal diet with ongoing lab monitoring-not on phosphorus binder.  # Nutrition: Continue renal diet/renal multivitamin and oral nutritional supplementation.  # Hypertension: Blood pressure trend improving with ultrafiltration.  Also on Lasix.  #Hyponatremia, hyper volume: Continue UF with PD and fluid restriction.  Monitor lab.  Subjective: Seen and examined.  No issue with PD.  No new event.  She has no complaint or concern. Objective Vital signs in last 24 hours: Vitals:   10/11/21 1805 10/11/21 2026 10/12/21 0511 10/12/21 0742  BP: (!) 158/55 (!) 154/68 (!) 136/52   Pulse: 63 65 (!) 109   Resp: 20 18 20    Temp: 98.2 F (36.8 C) 98 F (36.7 C) 98.1 F (36.7 C)   TempSrc: Oral Oral Oral   SpO2: 100% 97% 100% 98%  Weight:       Weight change:   Intake/Output Summary (Last 24 hours) at 10/12/2021 1106 Last data filed at 10/12/2021 1021 Gross per 24 hour  Intake 480 ml  Output --  Net 480 ml       Labs: Basic Metabolic Panel: Recent Labs  Lab 10/08/21 0515 10/12/21 0605  NA 128* 128*  K 4.8 5.0  CL 95* 93*  CO2 27 30  GLUCOSE 346* 284*  BUN 27* 25*  CREATININE 5.32* 5.52*  CALCIUM 8.0* 8.2*  PHOS 3.4  --     Liver Function Tests: Recent Labs  Lab 10/08/21 0515  ALBUMIN 1.5*   No results for input(s): LIPASE, AMYLASE in the last 168 hours. No results for input(s): AMMONIA in the last 168 hours. CBC: Recent Labs  Lab 10/12/21 0605  WBC 5.9  HGB 11.1*  HCT 35.3*  MCV 97.2  PLT 268   Cardiac Enzymes: No results for input(s): CKTOTAL, CKMB, CKMBINDEX, TROPONINI in the last 168 hours. CBG: Recent Labs  Lab 10/11/21 1134 10/11/21 1655 10/11/21 1800 10/11/21 2058 10/12/21 0603  GLUCAP 157* 191* 209* 248* 249*    Iron Studies: No results for input(s): IRON, TIBC, TRANSFERRIN, FERRITIN in the last 72 hours. Studies/Results: No results found.  Medications: Infusions:  dialysis solution 2.5% low-MG/low-CA     dialysis solution 4.25% low-MG/low-CA      Scheduled Medications:  ALPRAZolam  0.5 mg Oral QHS   amitriptyline  10 mg Oral QHS   aspirin EC  81 mg Oral BID   atorvastatin  20 mg Oral Q supper   darbepoetin (ARANESP) injection - NON-DIALYSIS  25 mcg Subcutaneous Q Thu-1800   furosemide  80 mg Oral Daily   gentamicin cream  1 application Topical Daily   HYDROcodone-acetaminophen  1 tablet Oral TID   insulin detemir  18 Units Subcutaneous Daily   insulin detemir  34 Units Subcutaneous QPM   magnesium oxide  800 mg Oral BID  melatonin  3 mg Oral QHS   metoprolol succinate  37.5 mg Oral Q breakfast   mometasone-formoterol  2 puff Inhalation BID   multivitamin with minerals  1 tablet Oral Daily   pantoprazole  40 mg Oral BID   potassium chloride  40 mEq Oral Daily   Vitamin D (Ergocalciferol)  50,000 Units Oral Weekly    have reviewed scheduled and prn medications.  Physical Exam: General:NAD, comfortable Heart:RRR, s1s2 nl Lungs:clear b/l, no crackle Abdomen:soft, Non-tender, non-distended Extremities: Some pitting edema present in lower extremities Dialysis Access: PD catheter in place.  [name] [name] [name] 10/12/2021,11:06 AM  LOS: 12 days

## 2021-10-12 NOTE — Progress Notes (Signed)
Hypoglycemic Event  CBG: 51  Treatment: 8 oz juice/soda, patient eating dinner   Symptoms: Shaky  Follow-up CBG: Time:1737 CBG Result:68  Possible Reasons for Event: Unknown  Comments/MD notified:Pamela Love, PA, STAT BMP ordered     [employer]

## 2021-10-12 NOTE — Progress Notes (Addendum)
Occupational Therapy Session Note  Patient Details  Name: Andrea Stewart MRN: [id number] Date of Birth: [birth year]  Today's Date: 10/12/2021 OT Individual Time: 2703-5009 session 1  OT Individual Time Calculation (min): 73 min  Session 2:1401-1501 ( 60 mins)   Short Term Goals: Week 2:  OT Short Term Goal 1 (Week 2): STG = LTG due to ELOS  Skilled Therapeutic Interventions/Progress Updates:  Session 1: Pt greeted seated at sink, lethargic and reporting feeling nauseous, checked with RN who reports the potasium makes pt nauseous and pt had been given zofran.  Pt  agreeable to OT intervention. Session focus on BADL reeducation, functional transfers, increasing activity tolerance for higher level BADLs and functional mobility. Pt completed UB bathing at sink with pt needing MINA for UB bathing to wash underneath LUE. Pt required MAX A for UB dressing as pt wearing long sleeve shirt needing increased assist to don d/t swelling, recommend pt wear looser short sleeve shirts. Pt declined LB bathing. Pt transported to day room with total A. Worked on functional stand pivot transfers with RW with pt needing CGA, pts posture improves when using RW vs HW. Pt additionally completed dynamic standing balance task where pt instructed to place pegs in board  with RUE with pt sliding RUE on table when reaching for pegs utilizing AAROM. Pt able to tolerate more AAROM this session, pt does report pain with shoulder ROM but reports "its tolerable." Pt additionally completed ~ 24 ft of functional mobility with rw and CGA with w/c follow. HR 54 bpm SpO2 97% on RA post ambulation. Pt returned to room with total A from w/c where pt requested to "work on her hair." Assisted pt with applying hair spray where pt used LUE to brush hair. Pt left seated in w/c with all needs within reach.                           Session 2: pt greeted seated in w/c agreeable to OT intervention. Pt report need to void bladder. Ambulatory  transfer from w/c>bathroom with rw and CGA, MIN A for 3/3 toileting tasks needing assit to pull pants up to waist line on R side. Pt completed ambulatory transfer from toilet to sink with rw and CGA. Pt stood for hand hygiene with supervision. Pt transported to gym from w/c with total A where pt worked on ROM in R shoulder. Pt completed x10 reps of AAROM shoulder flexion to ~ 70* with LUE acting as active assist, as well as AAROM shoulder ABD/ADD with LUE acting as active assist. Worked digit AROM to reduce edema with pt instructed to grasp and release compliant blocks, education provided on edema mgmt strategies such as positioning and AROM of digits. Pt reports increased pain in RUE after therex therefore switched task to standing to allow RUE to rest. Pt able to statically stand for a total of 2 mins and 15 secs before needing to sit, pt also able to complete x10 sit<>stands from w/c with overall supervision. Pt completed ~ 15 ft of functional ambulation back to room with rw and CGA. Pt complete sit>supine with CGA, pt left supine in bed with bed alarm activated and all needs within reach.   Therapy Documentation Precautions:  Precautions Precautions: None Precaution Comments: WBAT on RLE and RUE, RUE sling for comfort Required Braces or Orthoses: Sling Restrictions Weight Bearing Restrictions: No RUE Weight Bearing: Weight bearing as tolerated LUE Weight Bearing: Weight bearing as tolerated  RLE Weight Bearing: Weight bearing as tolerated LLE Weight Bearing: Weight bearing as tolerated Other Position/Activity Restrictions: RUE sling for comfort  Pain: pt reports pain in RUE, offered rest breaks and repositioning as needed, as well as ice    Therapy/Group: Individual Therapy  Corinne Ports [facility] 10/12/2021, 11:59 AM

## 2021-10-13 DIAGNOSIS — E1129 Type 2 diabetes mellitus with other diabetic kidney complication: Secondary | ICD-10-CM | POA: Diagnosis not present

## 2021-10-13 DIAGNOSIS — N186 End stage renal disease: Secondary | ICD-10-CM | POA: Diagnosis not present

## 2021-10-13 DIAGNOSIS — Z992 Dependence on renal dialysis: Secondary | ICD-10-CM | POA: Diagnosis not present

## 2021-10-13 DIAGNOSIS — S72001A Fracture of unspecified part of neck of right femur, initial encounter for closed fracture: Secondary | ICD-10-CM | POA: Diagnosis not present

## 2021-10-13 DIAGNOSIS — I120 Hypertensive chronic kidney disease with stage 5 chronic kidney disease or end stage renal disease: Secondary | ICD-10-CM | POA: Diagnosis not present

## 2021-10-13 LAB — GLUCOSE, CAPILLARY
Glucose-Capillary: 125 mg/dL — ABNORMAL HIGH (ref 70–99)
Glucose-Capillary: 158 mg/dL — ABNORMAL HIGH (ref 70–99)
Glucose-Capillary: 189 mg/dL — ABNORMAL HIGH (ref 70–99)
Glucose-Capillary: 368 mg/dL — ABNORMAL HIGH (ref 70–99)
Glucose-Capillary: 400 mg/dL — ABNORMAL HIGH (ref 70–99)
Glucose-Capillary: 457 mg/dL — ABNORMAL HIGH (ref 70–99)

## 2021-10-13 MED ORDER — INSULIN DETEMIR 100 UNIT/ML ~~LOC~~ SOLN
34.0000 [IU] | Freq: Every evening | SUBCUTANEOUS | Status: DC
Start: 2021-10-13 — End: 2021-10-14
  Administered 2021-10-13: 34 [IU] via SUBCUTANEOUS
  Filled 2021-10-13 (×3): qty 0.34

## 2021-10-13 MED ORDER — METOPROLOL SUCCINATE ER 25 MG PO TB24
25.0000 mg | ORAL_TABLET | Freq: Every day | ORAL | Status: DC
  Administered 2021-10-14 – 2021-10-15 (×2): 25 mg via ORAL
  Filled 2021-10-13 (×2): qty 1

## 2021-10-13 MED ORDER — ATORVASTATIN CALCIUM 10 MG PO TABS
10.0000 mg | ORAL_TABLET | Freq: Every day | ORAL | Status: DC
  Administered 2021-10-13 – 2021-10-15 (×3): 10 mg via ORAL
  Filled 2021-10-13 (×2): qty 1

## 2021-10-13 MED ORDER — INSULIN ASPART 100 UNIT/ML IJ SOLN
2.0000 [IU] | Freq: Once | INTRAMUSCULAR | Status: AC
  Administered 2021-10-13: 2 [IU] via SUBCUTANEOUS

## 2021-10-13 MED ORDER — INSULIN DETEMIR 100 UNIT/ML ~~LOC~~ SOLN: 30.0000 [IU] | Freq: Every evening | SUBCUTANEOUS | Status: DC

## 2021-10-13 MED ORDER — INSULIN DETEMIR 100 UNIT/ML ~~LOC~~ SOLN
16.0000 [IU] | Freq: Every day | SUBCUTANEOUS | Status: DC
Start: 2021-10-14 — End: 2021-10-14
  Administered 2021-10-14: 16 [IU] via SUBCUTANEOUS
  Filled 2021-10-13 (×2): qty 0.16

## 2021-10-13 NOTE — Progress Notes (Signed)
PROGRESS NOTE   Subjective/Complaints: Hypoglycemic yesterday evening to 51 and received juice/soda afterward Not feeling well this morning and feels may be related to medications   ROS:   Pt denies SOB, abd pain, CP, N/V/C/D, and vision changes, +right shoulder pain, +malaise   Objective:   No results found. Recent Labs    10/12/21 0605  WBC 5.9  HGB 11.1*  HCT 35.3*  PLT 268    Recent Labs    10/12/21 0605  NA 128*  K 5.0  CL 93*  CO2 30  GLUCOSE 284*  BUN 25*  CREATININE 5.52*  CALCIUM 8.2*      Intake/Output Summary (Last 24 hours) at 10/13/2021 0911 Last data filed at 10/12/2021 1818 Gross per 24 hour  Intake 594 ml  Output --  Net 594 ml        Physical Exam: Vital Signs Blood pressure 132/88, pulse (!) 54, temperature (!) 97.5 F (36.4 C), temperature source Temporal, resp. rate 20, weight 86.4 kg, SpO2 99 %. General: drowsy this morning due to low CBG, still interactive HENT: conjugate gaze; oropharynx moist CV: Bradycardic Pulmonary: CTA B/L; no W/R/R- good air movement GI: soft, NT, ND, (+)BS- PD catheter in place.  Psychiatric: appropriate; slightly more interactive, anxious Neurological: vague, but appropriate .  Ext: no clubbing, cyanosis, RUE trace distal edema Psych: pleasant and cooperative  Skin: dry Musc: RUE edema. Proximal RUE/RLE tenderness. Sling in place Neuro: Alert RUE/RLE: still somewhat limited due to pain, stable > proximally  Assessment/Plan: 1. Functional deficits which require 3+ hours per day of interdisciplinary therapy in a comprehensive inpatient rehab setting. Physiatrist is providing close team supervision and 24 hour management of active medical problems listed below. Physiatrist and rehab team continue to assess barriers to discharge/monitor patient progress toward functional and medical goals  Care Tool:  Bathing    Body parts bathed by  patient: Right arm, Chest, Abdomen   Body parts bathed by helper: Left arm     Bathing assist Assist Level: Minimal Assistance - Patient > 75%     Upper Body Dressing/Undressing Upper body dressing   What is the patient wearing?: Pull over shirt (long sleeve shirt)    Upper body assist Assist Level: Maximal Assistance - Patient 25 - 49%    Lower Body Dressing/Undressing Lower body dressing      What is the patient wearing?: Pants     Lower body assist Assist for lower body dressing: Total Assistance - Patient < 25%     Toileting Toileting    Toileting assist Assist for toileting: Minimal Assistance - Patient > 75%     Transfers Chair/bed transfer  Transfers assist     Chair/bed transfer assist level: Supervision/Verbal cueing Chair/bed transfer assistive device: Programmer, multimedia   Ambulation assist      Assist level: Supervision/Verbal cueing Assistive device: Walker-rolling Max distance: 39ft   Walk 10 feet activity   Assist     Assist level: Supervision/Verbal cueing Assistive device: Walker-rolling   Walk 50 feet activity   Assist Walk 50 feet with 2 turns activity did not occur: Safety/medical concerns  Assist level: Contact Guard/Touching assist Assistive device:  Walker-rolling    Walk 150 feet activity   Assist Walk 150 feet activity did not occur: Safety/medical concerns         Walk 10 feet on uneven surface  activity   Assist Walk 10 feet on uneven surfaces activity did not occur: Safety/medical concerns (pain, fatigue, weakness, decreased balance)   Assist level: Contact Guard/Touching assist Assistive device: Walker-rolling   Wheelchair     Assist Is the patient using a wheelchair?: Yes Type of Wheelchair: Manual Wheelchair activity did not occur: Safety/medical concerns (pain, fatigue, weakness, decreased balance)  Wheelchair assist level: Dependent - Patient 0%      Wheelchair 50 feet with  2 turns activity    Assist    Wheelchair 50 feet with 2 turns activity did not occur: Safety/medical concerns (pain, fatigue, weakness, decreased balance)       Wheelchair 150 feet activity     Assist  Wheelchair 150 feet activity did not occur: Safety/medical concerns (pain, fatigue, weakness, decreased balance)       Blood pressure 132/88, pulse (!) 54, temperature (!) 97.5 F (36.4 C), temperature source Temporal, resp. rate 20, weight 86.4 kg, SpO2 99 %.    Medical Problem List and Plan: 1.  Nondisplaced right hip greater trochanteric fracture as well as right nondisplaced fracture of the lateral humeral head secondary to fall.  Nonoperative.  Follow-up Dr. Earnestine Leys.  Weightbearing as tolerated.  Shoulder sling for comfort.  Continue CIR- PT, OT  2.  Antithrombotics: -DVT/anticoagulation:  Mechanical:  Antiembolism stockings, knee (TED hose) Bilateral lower extremities.               -antiplatelet therapy: N/A 3. Postoperative pain:   Switch from oxycodone to hydrocodone as she has felt more benefit from hydrocodone in the past.  4. Anxiety: Continue Xanax 0.5 mg nightly, Vistaril 25 mg twice daily as needed, trazodone as needed. Please place on Dr. Ferne Coe schedule             -antipsychotic agents: N/A 5. Neuropsych: This patient is capable of making decisions on her own behalf. 6. Skin/Wound Care: Routine skin checks 7. Fluids/Electrolytes/Nutrition: Routine in and outs 8.  End-stage renal disease on PD.  Continue peritoneal dialysis 9.  Anemia of chronic disease.    Hemoglobin 10.1 on 11/19  Continue to monitor 10.  Hypertension, but currently soft. D/c aldactone. Toprol-XL 25 mg daily, Lasix 80 mg daily.   11.  Diabetes mellitus with hyperglycemia.  Hemoglobin A1c 9.7. decrease NovoLog to 3 units 3 times daily, Increase Levemir to 16 units twice daily.    CBG (last 3)  Recent Labs    10/12/21 1835 10/12/21 2058 10/13/21 0559  GLUCAP 111* 251* 368*     11/23 still poor but improving control. Will adjust NPH to 20u qam and 35u qpm per diabetes coord recs.  11/24- Bgs slightly better- will wait to change til tomorrow, since just changed yesterday.   11/25- CBG's actually have dropped a little low- 60-186- will decrease Levemir to 18 units in AM and 34 units in evening.   11/26: d/c ISS coverage since hypoglycemic to 50. Keep checking CBGs AC/HS so we can adjust regimen accordingly.  11/27: CBG down to 27: glucose gel given with improvement to 40, another given with increase to 72, d/c Novolog with meals. If CBGs high tomorrow, d/c feeding supplement  11/29: Hypoglycemic to 51 yesterday evening: decrease daytime Levemir to 16U.  12.  Hyperlipidemia.  LDL 53 on 10/18, HDL 60,  VLDL 22, Lipitor, TG 111. Will discuss with son decreasing Lipitor dose given her fatigue, pain, and brain fog. Decrease Lipitor to 10mg .  13.  COPD with tobacco abuse.  Continue nebulizers as directed.  Check oxygen saturations every shift.  Provide counsel guards to cessation of nicotine products. 14.  Obesity.  BMI 40.07  Dietary follow-up 15. Tachycardia: now bradycardic, decrease lopressor to 25mg  16. Hypomagnesemia: will be managed by nephrology while she is inpatient 17. Hyponatremia: likely secondary to volume overload as per nephrology. Improved to 127 on 11/19. Monitor with dialysis.   11/24- Na up slightly to 128 today- con't to monitor  11/28: Na stable at 128- continue to monitor with labs.  18.  Sleep disturbance: started Amitriptyline 10mg  HS. Schedule outpatient sleep study  Appears to be improving 18. Disposition: HFU scheduled with Dr. Ranell Patrick   LOS: 13 days A FACE TO FACE EVALUATION WAS [place] 10/13/2021, 9:11 AM

## 2021-10-13 NOTE — Progress Notes (Addendum)
0004: Rechecked sugar, sugar at 418, patient states "do not come in here again and check my sugar"  2344: Rechecked sugar which is now 457. Called Pam, verbal orders for lab draw to double check accurate numbers. Nurse went to tell patient this; who then became outraged and said "go get my husband, I am not having my blood drawn this is stupid!". Nurse explained she can not just go get her husband and reason for wanting blood draw. She said she would only take fast acting insulin and that was the end of it and screamed "NO" when nurse tried to reason with her. Called Pam back and received verbal order for 2 units of insulin with recheck in one hour. Patient very upset and said "you are disturbing my sleep".  2230: Patient's blood sugar 400, called Pam, PA on call and received verbal orders to give 2 cups of water and re check in one hour. Patient extremely irritated with nurse over drinking two cups of water. Patient stated, "I was sleeping good and now you're bothering me making me drink this water, 400 isn't even that high". Explained to patient this was for her health and well being and she drank one and a half glasses and said "no more". Will re check sugar when it is time to do so.

## 2021-10-13 NOTE — Progress Notes (Signed)
Physical Therapy Session Note  Patient Details  Name: Andrea Stewart MRN: [id number] Date of Birth: 1946/06/29  Today's Date: 10/13/2021 PT Individual Time: 0800-0855 PT Individual Time Calculation (min): 55 min   Short Term Goals: Week 1:  PT Short Term Goal 1 (Week 1): pt will perform bed mobility with min A PT Short Term Goal 1 - Progress (Week 1): Progressing toward goal PT Short Term Goal 2 (Week 1): pt will transfer sit<>stand with LRAD and supervision PT Short Term Goal 2 - Progress (Week 1): Progressing toward goal PT Short Term Goal 3 (Week 1): pt will ambulate 31ft with LRAD and supervision PT Short Term Goal 3 - Progress (Week 1): Progressing toward goal Week 2:  PT Short Term Goal 1 (Week 2): STG=LTG due to LOS  Skilled Therapeutic Interventions/Progress Updates:   Received pt semi-reclined in bed asleep, upon wakening pt slightly disoriented stating "where have you been? I've been waiting for you". Pt agreeable to PT treatment and reported pain 4/10 in RUE. Pt requesting pain medication but unable to recall that she just received pain meds 15 minutes earlier. Repositioning and rest breaks done to reduce pain levels. Session with emphasis on functional mobility, dressing, toileting, generalized strengthening, dynamic standing balance/coordination, gait training, and improved activity tolerance. Pt transferred semi-reclined<>sitting EOB with HOB elevated and use of bedrails with close supervision with encouragement for pt to be independent, as pt initially reaching out for therapist's hand. Donned pants sitting EOB with mod A and pt stood without AD and CGA and required mod A to pull pants over hips on R side. Pt reported urge to toilet and ambulated 44ft x 2 trials with RW and CGA/close supervision in/out of bathroom. With encouragement pt able to manage clothing without assist and continent of bladder - performed peri-care standing with close supervision. Pt stood at sink and  washed hands with close supervision and sat in [facility] and brushed teeth at sink with set up assist with emphasis on functional use of RUE. Pt frequently stating "I can't do this" with ADL tasks but with encouragement, able to do most activities without any assist. Doffed dirty shirt with mod A and increased time and clean pull over shirt with supervision. Sit<>stand with RW and close supervision and pt ambulated 55ft x 2 trials with RW and close supervision. Pt required maximal encouragement to continue ambulating frequently stopping and saying "I can't". Of note, pt required increased time with all mobility and c/o fatigue and pain with any and all movements. Pt reported that her current medication is making her feel more confused than normal - notified RN. Concluded session with pt sitting in WC, needs within reach, and chair pad alarm on.   Therapy Documentation Precautions:  Precautions Precautions: None Precaution Comments: WBAT on RLE and RUE, RUE sling for comfort Required Braces or Orthoses: Sling Restrictions Weight Bearing Restrictions: No RUE Weight Bearing: Weight bearing as tolerated LUE Weight Bearing: Weight bearing as tolerated RLE Weight Bearing: Weight bearing as tolerated LLE Weight Bearing: Weight bearing as tolerated Other Position/Activity Restrictions: RUE sling for comfort  Therapy/Group: Individual Therapy Alfonse Alpers PT, DPT   10/13/2021, 7:12 AM

## 2021-10-13 NOTE — Progress Notes (Signed)
Occupational Therapy Session Note  Patient Details  Name: Andrea Stewart MRN: [id number] Date of Birth: [birth year]  Today's Date: 10/13/2021 OT Individual Time: 1405-1530 OT Individual Time Calculation (min): 85 min    Short Term Goals: Week 2:  OT Short Term Goal 1 (Week 2): STG = LTG due to ELOS  Skilled Therapeutic Interventions/Progress Updates:  Skilled OT intervention completed with focus on functional transfers, ADL retraining, home management education and edema management education. Pt received supine in bed, agreeable to session. Bed mobility for upright in bed > seated EOB with supervision. Sit > stand using RW with CGA, then ambulatory transfer to toilet in bathroom with CGA. Safety cues needed for ambulating over incline threshold in bathroom. Able to doff pants in stance, with CGA needed for balance and pt able to functionally use R hand to assist in getting pants over hips even though pt reports she is unable. Continent episode, void only. Pt with noticeable minor L hand jerking, with pt reporting she felt like her sugar was "off". Called for RN, with RN reporting blood sugar at 125. Pt reporting that's low for her, normally being towards the 200's. RN provided juice. Pt able to sit > stand using RW, with CGA provided for balance during front pericare, then donning of pants over hips with pt able to complete but with encouragement and increased time. Ambulatory transfer with RW and CGA to w/c. Requires min A to scoot hips back in w/c. Required seated rest break to finish rest of juice, with pt denying further symptoms of dizziness. Supervision for teeth brushing at sink while seated. Unexpected shower drain issue, resulting in overflow of water from pt's bathroom, prompted therapist to transport pt with total A for safety out of room to hallway, with RN and maintenance notified. Relocated pt to new room, then resumed grooming tasks at sink while seated in w/c, including face washing,  shaving and hair brushing all while using R hand as gross assist. Pt sit > stand, then stand pivot using RW and CGA to seated EOB. Pt required min A for bed mobility to bring BLEs onto bed and repositioning higher up in bed, due to fatigue. Positioned R arm under 3 pillows, to decrease distal extremity swelling, and educated pt on importance of AROM in hand and keeping elevated while in bed. Pt left semi-supine with bed alarm on and all needs in reach at end of session.  Therapy Documentation Precautions:  Precautions Precautions: None Precaution Comments: WBAT on RLE and RUE, RUE sling for comfort Required Braces or Orthoses: Sling Restrictions Weight Bearing Restrictions: No RUE Weight Bearing: Weight bearing as tolerated LUE Weight Bearing: Weight bearing as tolerated RLE Weight Bearing: Weight bearing as tolerated LLE Weight Bearing: Weight bearing as tolerated Other Position/Activity Restrictions: RUE sling for comfort  Pain: 8/10 pain in R hand, premedicated, repositioned for comfort, pt denying need for further intervention   Therapy/Group: Individual Therapy  Lisel Siegrist E Amily Depp 10/13/2021, 8:00 AM

## 2021-10-13 NOTE — Progress Notes (Signed)
Patient voiced concerns about waking up "confused". Patient also stated she does not want the scheduled pain medication and would like for that to be switched to PRN. Nurse answered questions related to medications patient is taking, but patient still concerned about confusion. Explained could be related to different environment, medications, etc, but still would like provider to have conversation with her about this.

## 2021-10-13 NOTE — Progress Notes (Signed)
[facility] NEPHROLOGY PROGRESS NOTE  Assessment/ Plan:  Dialysis Orders: Center: [facility]  on [facility] . EDW 73 kg, 5 exchanges, 2 liters fill, fill time 10 min, dwell time 1.5 hrs, drain time 20 minutes.   # Status post right hip and shoulder fractures: She continues to make improvements with non-surgical management in the [facility] inpatient rehabilitation with ongoing PT/OT.  Tentative discharge date set for 12/2.  #. ESRD: Continue current prescription of peritoneal dialysis alternating 4.25% and 2.5% dianeal solution for volume overload-she continues to feel better with ongoing ultrafiltration. D/w PD nurse.  Net UF around 2 L.  #. Anemia: Hemoglobin at goal, continue ESA and trend hemoglobin/hematocrit.    # CKD-MBD: Continue renal diet with ongoing lab monitoring-not on phosphorus binder.  # Nutrition: Continue renal diet/renal multivitamin and oral nutritional supplementation.  # Hypertension: Blood pressure trend improving with ultrafiltration.  Also on Lasix.  #Hyponatremia, hyper volume: Continue UF with PD and fluid restriction.  Monitor lab.  Subjective: Seen and examined.  No issue with PD.  Working with OT.  No complaint or concern. Objective Vital signs in last 24 hours: Vitals:   10/12/21 2106 10/13/21 0518 10/13/21 0722 10/13/21 0741  BP:  (!) 124/98 (!) 119/94 132/88  Pulse:  (!) 51 (!) 58 (!) 54  Resp:  18  20  Temp:  97.9 F (36.6 C)  (!) 97.5 F (36.4 C)  TempSrc:  Oral  Temporal  SpO2: 96% 98%  99%  Weight:    84.4 kg   Weight change:   Intake/Output Summary (Last 24 hours) at 10/13/2021 1047 Last data filed at 10/12/2021 1818 Gross per 24 hour  Intake 474 ml  Output --  Net 474 ml        Labs: Basic Metabolic Panel: Recent Labs  Lab 10/08/21 0515 10/12/21 0605  NA 128* 128*  K 4.8 5.0  CL 95* 93*  CO2 27 30  GLUCOSE 346* 284*  BUN 27* 25*  CREATININE 5.32* 5.52*  CALCIUM 8.0* 8.2*  PHOS 3.4  --     Liver  Function Tests: Recent Labs  Lab 10/08/21 0515  ALBUMIN 1.5*    No results for input(s): LIPASE, AMYLASE in the last 168 hours. No results for input(s): AMMONIA in the last 168 hours. CBC: Recent Labs  Lab 10/12/21 0605  WBC 5.9  HGB 11.1*  HCT 35.3*  MCV 97.2  PLT 268    Cardiac Enzymes: No results for input(s): CKTOTAL, CKMB, CKMBINDEX, TROPONINI in the last 168 hours. CBG: Recent Labs  Lab 10/12/21 1705 10/12/21 1738 10/12/21 1835 10/12/21 2058 10/13/21 0559  GLUCAP 51* 68* 111* 251* 368*     Iron Studies: No results for input(s): IRON, TIBC, TRANSFERRIN, FERRITIN in the last 72 hours. Studies/Results: No results found.  Medications: Infusions:  dialysis solution 2.5% low-MG/low-CA     dialysis solution 4.25% low-MG/low-CA      Scheduled Medications:  ALPRAZolam  0.5 mg Oral QHS   amitriptyline  10 mg Oral QHS   aspirin EC  81 mg Oral BID   atorvastatin  10 mg Oral Q supper   darbepoetin (ARANESP) injection - NON-DIALYSIS  25 mcg Subcutaneous Q Thu-1800   furosemide  80 mg Oral Daily   gentamicin cream  1 application Topical Daily   HYDROcodone-acetaminophen  1 tablet Oral TID   [START ON 10/14/2021] insulin detemir  16 Units Subcutaneous Daily   insulin detemir  34 Units Subcutaneous QPM   magnesium oxide  800 mg Oral BID   melatonin  3 mg Oral QHS   [START ON 10/14/2021] metoprolol succinate  25 mg Oral Q breakfast   mometasone-formoterol  2 puff Inhalation BID   multivitamin with minerals  1 tablet Oral Daily   pantoprazole  40 mg Oral BID   potassium chloride  40 mEq Oral Daily   Vitamin D (Ergocalciferol)  50,000 Units Oral Weekly    have reviewed scheduled and prn medications.  Physical Exam: General:NAD, comfortable Heart:RRR, s1s2 nl Lungs:clear b/l, no crackle Abdomen:soft, Non-tender, non-distended Extremities: Some pitting edema present in lower extremities Dialysis Access: PD catheter in place.  Andrea Stewart  Andrea Stewart 10/13/2021,10:47 AM  LOS: 13 days

## 2021-10-13 NOTE — Progress Notes (Signed)
Occupational Therapy Session Note  Patient Details  Name: Andrea Stewart MRN: [id number] Date of Birth: 1946-02-01  Today's Date: 10/13/2021 OT Individual Time: 1004-1100 OT Individual Time Calculation (min): 56 min    Short Term Goals: Week 2:  OT Short Term Goal 1 (Week 2): STG = LTG due to ELOS  Skilled Therapeutic Interventions/Progress Updates:    Pt sitting in wheelchair to start session stating she was feeling bad secondary to her medicine.  Unable to determine what specific medicine she took this am to make her feel bad.  Agreeable to therapy so took her down to the ortho gym via wheelchair where she transferred to the therapy mat with min guard assist and use of the RW for support.  Noted severe right hand and forearm edema.  Noted pt with kinesiotape to on the hand and forearm as well with parts of it coming off.  Therapist removed tape and had pt transition to supine with min assist for completion of retrograde massage and AAROM.  Therapist completed massage with noted redness and one area on the forearm leaking clear fluid, where tape was removed.  Nursing made award and foam bandage was placed over the spot on the forearm.  Pt completed 1 set of 25 reps digit flexion/extension for the RUE with hand elevated above elbow.  She also completed 1 set of 10 reps wrist flexion/extension and 1 set of AAROM elbow flexion extension.  Transitioned back to sitting with min assist and completed transfer back to the wheelchair at min guard assist using the RW for support.  She was transferred back to the bed at min guard per her request with RUE elevated and call button and phone in reach.  Encouraged pt to keep the hand above the elbow, propped on pillows and continue AROM at the digits, wrist, and elbow throughout the day.  Significant improvement noted in hand and digit edema after completing of massage and ROM.    Therapy Documentation Precautions:  Precautions Precautions: Fall Precaution  Comments: WBAT on RLE and RUE, RUE sling for comfort Required Braces or Orthoses: Sling Restrictions Weight Bearing Restrictions: No RUE Weight Bearing: Weight bearing as tolerated LUE Weight Bearing: Weight bearing as tolerated RLE Weight Bearing: Weight bearing as tolerated LLE Weight Bearing: Weight bearing as tolerated Other Position/Activity Restrictions: RUE sling for comfort  Pain: Pain Assessment Pain Scale: Faces Pain Score: 0-No pain Faces Pain Scale: Hurts little more Pain Type: Acute pain Pain Location: Arm Pain Orientation: Right Pain Descriptors / Indicators: Grimacing;Discomfort Pain Onset: With Activity Pain Intervention(s): Repositioned;Distraction;Elevated extremity;Emotional support ADL: See Care Tool Section for some details of mobility and selfcare   Therapy/Group: Individual Therapy  Milan Perkins OTR/L 10/13/2021, 11:13 AM

## 2021-10-14 DIAGNOSIS — Z992 Dependence on renal dialysis: Secondary | ICD-10-CM | POA: Diagnosis not present

## 2021-10-14 DIAGNOSIS — I120 Hypertensive chronic kidney disease with stage 5 chronic kidney disease or end stage renal disease: Secondary | ICD-10-CM | POA: Diagnosis not present

## 2021-10-14 DIAGNOSIS — N186 End stage renal disease: Secondary | ICD-10-CM | POA: Diagnosis not present

## 2021-10-14 DIAGNOSIS — E1129 Type 2 diabetes mellitus with other diabetic kidney complication: Secondary | ICD-10-CM | POA: Diagnosis not present

## 2021-10-14 DIAGNOSIS — S72001A Fracture of unspecified part of neck of right femur, initial encounter for closed fracture: Secondary | ICD-10-CM | POA: Diagnosis not present

## 2021-10-14 LAB — GLUCOSE, CAPILLARY
Glucose-Capillary: 145 mg/dL — ABNORMAL HIGH (ref 70–99)
Glucose-Capillary: 237 mg/dL — ABNORMAL HIGH (ref 70–99)
Glucose-Capillary: 313 mg/dL — ABNORMAL HIGH (ref 70–99)
Glucose-Capillary: 359 mg/dL — ABNORMAL HIGH (ref 70–99)
Glucose-Capillary: 403 mg/dL — ABNORMAL HIGH (ref 70–99)
Glucose-Capillary: 418 mg/dL — ABNORMAL HIGH (ref 70–99)

## 2021-10-14 MED ORDER — INSULIN NPH (HUMAN) (ISOPHANE) 100 UNIT/ML ~~LOC~~ SUSP
34.0000 [IU] | Freq: Every day | SUBCUTANEOUS | Status: DC
  Administered 2021-10-14: 34 [IU] via SUBCUTANEOUS
  Filled 2021-10-14: qty 10

## 2021-10-14 MED ORDER — INSULIN ASPART 100 UNIT/ML IJ SOLN
3.0000 [IU] | Freq: Every day | INTRAMUSCULAR | Status: DC
  Administered 2021-10-14: 3 [IU] via SUBCUTANEOUS

## 2021-10-14 MED ORDER — INSULIN NPH (HUMAN) (ISOPHANE) 100 UNIT/ML ~~LOC~~ SUSP
16.0000 [IU] | Freq: Every day | SUBCUTANEOUS | Status: DC
  Administered 2021-10-15 – 2021-10-16 (×2): 16 [IU] via SUBCUTANEOUS
  Filled 2021-10-14: qty 10

## 2021-10-14 NOTE — Progress Notes (Signed)
Occupational Therapy Session Note  Patient Details  Name: Andrea Stewart MRN: [id number] Date of Birth: 1946-04-29  Today's Date: 10/14/2021 OT Group Time: 1435-1535 OT Group Time Calculation (min): 60 min   Short Term Goals: Week 2:  OT Short Term Goal 1 (Week 2): STG = LTG due to ELOS  Skilled Therapeutic Interventions/Progress Updates:  Pt participated in group session with a focus on stress mgmt, education on healthy coping strategies, and social interaction. Focus of session on providing coping strategies to manage new current level of function as a result of new diagnosis.  Session focus on breaking down stressors into "daily hassles," "major life stressors" and "life circumstances" in an effort to allow pts to chunk their stressors into groups. Pt actively sharing stressors in relation to loss of current function and not being able to be as independent as she once was. Pt shares her major life change has been the loss of her daughter as well as her husband subsequently also being ill.  Offered education on factors that protect [place] against stress such as "daily uplifts," "healthy coping strategies" and "protective factors." Encouraged all group members to share this information with their caregivers in order to increase caregiver communication and decrease caregiver burden of care. Issued pt handouts on healthy coping strategies to implement into routine. Pt transported back to room by RT.  Therapy Documentation Precautions:  Precautions Precautions: Fall Precaution Comments: WBAT on RLE and RUE, RUE sling for comfort Required Braces or Orthoses: Sling Restrictions Weight Bearing Restrictions: No RUE Weight Bearing: Weight bearing as tolerated LUE Weight Bearing: Weight bearing as tolerated RLE Weight Bearing: Weight bearing as tolerated LLE Weight Bearing: Weight bearing as tolerated Other Position/Activity Restrictions: RUE sling for comfort  Pain: No pain reported during  session    Therapy/Group: Group Therapy  Precious Haws 10/14/2021, 4:12 PM

## 2021-10-14 NOTE — Progress Notes (Signed)
Occupational Therapy Session Note  Patient Details  Name: KIELEY AKTER MRN: [id number] Date of Birth: [birth year]  Today's Date: 10/14/2021 OT Individual Time: 4818-5631 OT Individual Time Calculation (min): 71 min    Short Term Goals: Week 2:  OT Short Term Goal 1 (Week 2): STG = LTG due to ELOS  Skilled Therapeutic Interventions/Progress Updates:  Skilled OT intervention completed with focus on ADL retraining. Pt received seated EOB finishing breakfast, agreeable to session. Requested assist to toilet, with sit > stand at [facility] using RW, then ambulatory transfer to toilet in bathroom with CGA. Toileting tasks completed with supervision assist at the stance level using RW, with heavy encouragement to donn underwear herself. Ambulatory transfer back to seated EOB using RW at [facility]. Pt opting to complete sponge bathing vs full shower despite therapist recommendations that the transfer/task would be good practice for d/c home soon. RT in room offering to give pt inhaler, with pt refusing, then 5 mins later asking "why didn't he give me my inhaler, I told him to, with RT revisiting during session to administer. Pt able to doff shirt with min A due to pain in shoulder. RN in room to administer AM meds, with pt slightly resistant and increased time necessary to receive, as well as a revisit for insulin administration. Pt presented with increased confusion and irritability this session, presumably due to high blood sugar level- however willing to participate in therapy with re-directive cues. Completed UB bathing with min A to wash L arm due to pain/non-willingness to use R arm. Donned shirt with min A, with hemi-technique cues required. Sit > stand using RW with supervision assist then doffed pants over hips, alternating each hand on the RW demonstrating increased functional use of R hand during task. Bathed LB with mod A this session for time, then Mod A to thread/donn underwear/pants at stance level.  Completed grooming tasks while seated EOB with set up A. Bed mobility completed with supervision for seated EOB > semi-supine in bed, however cues needed to instruct pt not to lay sideways in bed. Pt left semi-supine in bed, with RUE elevated with 3 pillows to assist with mild swelling. Set pt up with sponges for AROM in hand as well and instructed to continue use/elevation til next therapy. Bed alarm activated and all needs in reach at end of session.   Therapy Documentation Precautions:  Precautions Precautions: Fall Precaution Comments: WBAT on RLE and RUE, RUE sling for comfort Required Braces or Orthoses: Sling Restrictions Weight Bearing Restrictions: No RUE Weight Bearing: Weight bearing as tolerated LUE Weight Bearing: Weight bearing as tolerated RLE Weight Bearing: Weight bearing as tolerated LLE Weight Bearing: Weight bearing as tolerated Other Position/Activity Restrictions: RUE sling for comfort  Pain: Unrated pain in shoulder, provided elevated/position of comfort, LPN in room and aware   Therapy/Group: Individual Therapy  Adely Facer E Katlin Ciszewski 10/14/2021, 7:31 AM

## 2021-10-14 NOTE — Progress Notes (Signed)
Physical Therapy Session Note  Patient Details  Name: Andrea Stewart MRN: [id number] Date of Birth: [birth year]  Today's Date: 10/14/2021 PT Individual Time: [phone] and 1710-1738 PT Individual Time Calculation (min): 40 min and 28 min  Short Term Goals: Week 2:  PT Short Term Goal 1 (Week 2): STG=LTG due to LOS  Skilled Therapeutic Interventions/Progress Updates:    Session 1: Pt received supine in bed and agreeable to therapy session. Pt overall appears to have more clear thoughts today compared to last time seen by this therapist and reports feeling less "foggy" since change in medications. Pt perseverating on the fact that her husband is D/Cing home from CIR today and wants to be involved in ensuring he gets home safely. Supine>sitting L EOB, HOB partially elevated and using bedrail, with supervision - pt plans to sleep in her electric recliner upon D/C because her bed is too tall for her to safely get on/off. Donned shoes max assist for time. Sit<>stands using RW with CGA - pt able to place R hand on/off RW with appearing more ease compared to last time seen by this therapist. Gait training ~16ft + ~46ft + ~28ft using RW with CGA for safety and therapist providing w/c follow in event of fatigue - demos slow gait speed though achieving reciprocal pattern and excessive trunk kyphosis/flexion - cuing for improvement throughout - does not appear to have pain in R LE and none reported, but does report R UE discomfort after prolonged walking with UE support on RW. Pt visits with her husband during seated rest breaks and demos some awareness discussing safe D/C plan with her and her husband. Pt's son entering during session. At end of session, pt left seated in w/c with chair alarm on, needs in reach, NT present, and pt's son present.  Session 2: Pt received supine in bed and agreeable to therapy session. Supine>sitting L EOB, HOB partially elevated, with supervision and increased time. Donned shoes  max assist for time. Sit<>stands using RW with CGA progressing to supervision. Gait training ~32ft to therapy gym using RW with CGA for safety - when going to turn and sit on Nustep requires verbal cuing for safety as pt starts to sit prior to turning fully demoing decreased safety awareness. B LE reciprocal movement pattern on Nustep for strengthening and endurance against level 4 resistance for 5 minutes totaling 129 steps - therapist provided music for increased speed of movement. Reports Borg RPE of 17/26 even though pt observably breathing easy and able to talk without difficulty. Pt reports B LE and whole body fatigue stating "if I do anything else I wont have enough energy to eat." L stand pivot back to w/c using RW with CGA and cuing again to back up fully prior to initiating sit to ensure pt safety. Transported back to room and pt left seated in w/c with needs in reach, chair alarm on, and meal tray set-up.    Therapy Documentation Precautions:  Precautions Precautions: Fall Precaution Comments: WBAT on RLE and RUE, RUE sling for comfort Required Braces or Orthoses: Sling Restrictions Weight Bearing Restrictions: No RUE Weight Bearing: Weight bearing as tolerated LUE Weight Bearing: Weight bearing as tolerated RLE Weight Bearing: Weight bearing as tolerated LLE Weight Bearing: Weight bearing as tolerated Other Position/Activity Restrictions: RUE sling for comfort  Pain:  Session 1: Reports some R UE discomfort with increased gait distance with R UE support on RW - provided rest breaks for pain management but this does not limit pt's  participation.  Session 2: When inquired about pain pt states: "the pain is there...considering I got 2 knee replacements, what do you expect."     Therapy/Group: Individual Therapy  Tawana Scale , PT, DPT, NCS, CSRS  10/14/2021, 7:59 AM

## 2021-10-14 NOTE — Discharge Summary (Signed)
Physician Discharge Summary  Patient ID: Andrea Stewart MRN: [id number] DOB/AGE: [birth year] 75 y.o.  Admit date: 09/30/2021 Discharge date: 10/16/2021  Discharge Diagnoses:  Principal Problem:   Closed right hip fracture [facility]) Active Problems:   Intertrochanteric fracture of right hip (HCC)   Acute pain of right shoulder   Sleep disturbance   Labile blood glucose   Anemia of chronic disease   Swelling Hyperlipidemia End-stage renal disease with peritoneal dialysis Obesity COPD with tobacco use Right nondisplaced fracture of the lateral humeral head with involvement of greater tuberosity Hyponatremia  Discharged Condition: Stable  Significant Diagnostic Studies: DG Shoulder Right  Result Date: 09/20/2021 CLINICAL DATA:  Recent fall with shoulder pain, initial encounter EXAM: RIGHT SHOULDER - 2+ VIEW COMPARISON:  03/26/2009 FINDINGS: There are changes consistent with prior healed right proximal humeral fracture. Degenerative changes of the acromioclavicular joint are seen. No acute fracture or dislocation is noted. The underlying bony thorax is within normal limits. Postsurgical changes in the cervical spine are seen. IMPRESSION: Healed prior proximal right humeral fracture. No acute abnormality noted. Electronically Signed   By: Inez Catalina M.D.   On: 09/20/2021 23:30   DG Elbow 2 Views Right  Result Date: 09/21/2021 CLINICAL DATA:  Lost balance on toilet leading to fall. Right elbow pain. EXAM: RIGHT ELBOW - 2 VIEW COMPARISON:  None. FINDINGS: There is no evidence of fracture, dislocation, or joint effusion. Mild degenerative change with spurring. Enthesopathic change about the lateral epicondyle. Soft tissues are unremarkable. Vascular calcifications are seen. IMPRESSION: No fracture or subluxation of the right elbow. Electronically Signed   By: Keith Rake M.D.   On: 09/21/2021 01:56   CT HEAD WO CONTRAST (5MM)  Result Date: 09/21/2021 CLINICAL DATA:  Recent fall with  headaches, initial encounter EXAM: CT HEAD WITHOUT CONTRAST TECHNIQUE: Contiguous axial images were obtained from the base of the skull through the vertex without intravenous contrast. COMPARISON:  12/31/2015 FINDINGS: Brain: No evidence of acute infarction, hemorrhage, hydrocephalus, extra-axial collection or mass lesion/mass effect. Mild atrophic and chronic white matter ischemic changes are noted. Vascular: No hyperdense vessel or unexpected calcification. Skull: Normal. Negative for fracture or focal lesion. Sinuses/Orbits: No acute finding. Other: None. IMPRESSION: Chronic atrophic and ischemic changes without acute abnormality. Electronically Signed   By: Inez Catalina M.D.   On: 09/21/2021 00:25   CT Shoulder Right Wo Contrast  Result Date: 09/21/2021 CLINICAL DATA:  Right shoulder fracture.  Fall. EXAM: CT OF THE UPPER RIGHT EXTREMITY WITHOUT CONTRAST TECHNIQUE: Multidetector CT imaging of the upper right extremity was performed according to the standard protocol. COMPARISON:  Radiograph dated 09/21/2021. FINDINGS: Bones/Joint/Cartilage There is a nondisplaced fracture of the lateral humeral head with involvement of the greater tuberosity. Evaluation of the fracture is very limited due to advanced osteopenia. Old healed fracture of the humeral diaphysis noted. There is degenerative changes of the shoulder with spurring. No dislocation. No significant joint effusion. Probable chronic thickening of the joint capsule. Ligaments Suboptimally assessed by CT. Muscles and Tendons No acute findings.  No intramuscular hematoma. Soft tissues The soft tissues are unremarkable. IMPRESSION: 1. Nondisplaced fracture of the lateral humeral head with involvement of the greater tuberosity. Evaluation of the fracture is very limited due to advanced osteopenia. 2. Old healed fracture of the humeral diaphysis. 3. Degenerative changes of the shoulder with spurring. Electronically Signed   By: Anner Crete M.D.   On:  09/21/2021 03:17   CT Hip Right Wo Contrast  Result Date: 09/21/2021 CLINICAL DATA:  Recent fall with findings suspicious for hip fracture with negative x-rays, initial encounter EXAM: CT OF THE RIGHT HIP WITHOUT CONTRAST TECHNIQUE: Multidetector CT imaging of the right hip was performed according to the standard protocol. Multiplanar CT image reconstructions were also generated. COMPARISON:  Plain film from earlier in the same day. FINDINGS: Bones/Joint/Cartilage Postsurgical changes are noted in the lower lumbar spine. Degenerative changes of the sacroiliac joints are seen. There are findings consistent with a right intratrochanteric femoral fracture without significant displacement. No femoral neck fracture is seen. No dislocation is noted. Chronic coccygeal fracture is noted. The remainder of the bony pelvis shows no acute abnormality. Changes of prior ORIF of proximal left femoral fracture are seen. Ligaments Suboptimally assessed by CT. Muscles and Tendons Surrounding musculature appears within normal limits. Soft tissues Peritoneal dialysis catheter is noted deep in the pelvis. Bladder is decompressed. No free fluid is seen. Visualized bowel structures are unremarkable. IMPRESSION: Undisplaced right femoral intratrochanteric fracture. Chronic changes as described above. Electronically Signed   By: Inez Catalina M.D.   On: 09/21/2021 00:32   DG Humerus Right  Result Date: 09/21/2021 CLINICAL DATA:  Lost balance leading to fall.  Right arm pain. EXAM: RIGHT HUMERUS - 2+ VIEW COMPARISON:  Shoulder radiograph yesterday FINDINGS: Possible fracture arising from the lateral humeral head, not definitively seen on prior shoulder exam. Remote healed proximal humeral shaft fracture. No focal soft tissue abnormalities. IMPRESSION: 1. Possible fracture arising from the lateral humeral head. This was not confirmed on concurrent shoulder exam. If there is focal tenderness in this region, consider CT. 2. Remote healed  proximal humeral shaft fracture. Electronically Signed   By: Keith Rake M.D.   On: 09/21/2021 01:58   DG Hip Unilat  With Pelvis 2-3 Views Right  Result Date: 09/20/2021 CLINICAL DATA:  Fall injury. EXAM: DG HIP (WITH OR WITHOUT PELVIS) 2-3V RIGHT COMPARISON:  Similar study 03/06/2014 FINDINGS: There is osteopenia without evidence of displaced fractures. There is mild joint space loss and spurring at the hips, spurring and sclerosis of SI joints and pubic symphysis and enthesopathic changes in the pelvis. There has been an interval left hip nailing which appears well-healed. Externally inserted tubing entering from the right is coiled in the pelvic floor. Again noted are changes of prior L4-5 and L5-S1 discectomy with interbody metallic cylinder grafts. The iliofemoral arteries are heavily calcified. IMPRESSION: Osteopenia, degenerative and postsurgical changes, without evidence of fractures. Progressively heavy vascular calcifications. Additional findings discussed above. Electronically Signed   By: Telford Nab M.D.   On: 09/20/2021 23:32    Labs:  Basic Metabolic Panel: Recent Labs  Lab 10/12/21 0605  NA 128*  K 5.0  CL 93*  CO2 30  GLUCOSE 284*  BUN 25*  CREATININE 5.52*  CALCIUM 8.2*    CBC: Recent Labs  Lab 10/12/21 0605  WBC 5.9  HGB 11.1*  HCT 35.3*  MCV 97.2  PLT 268    CBG: Recent Labs  Lab 10/14/21 0614 10/14/21 0853 10/14/21 1136 10/14/21 1632 10/14/21 2125  GLUCAP 359* 313* 237* 145* 403*   Family history.  Mother with hypertension is colon cancer as well as CAD and diabetes mellitus.  Follow-up prostate cancer and COPD.  Denies any esophageal cancer or rectal cancer  Brief HPI:   Andrea Stewart is a 75 y.o. right-handed female with history of COPD/tobacco use hypertension diabetes mellitus obesity with BMI 39.27 end-stage renal disease with peritoneal dialysis.  Per chart review patient lives with spouse 1  level home.  She has 4 sons and an aide to  help with ADLs.  Modified independent for mobility.  Her husband was currently admitted to [facility] for intraparenchymal hemorrhage.  Presented to [facility] 09/21/2021 after mechanical fall when she stumbled and fell on her right side.  No loss of consciousness.  Denied any presyncope chest pain or palpitations related to the fall.  Cranial CT scan negative.  CT of the right hip showed undisplaced right femoral intertrochanteric fracture as well as CT of right shoulder showing nondisplaced fracture of the lateral humeral head and involvement of the greater tuberosity.  Old healed fracture of the humeral diaphysis.  Admission chemistry sodium 134 BUN 33 creatinine 7.53 hemoglobin 13.1 hemoglobin A1c 9.9.  Orthopedic service follow-up Dr. Earnestine Leys no current surgical intervention weightbearing as tolerated right upper extremity weightbearing as tolerated right lower extremity with right upper extremity sling for support.  In regards to patient's end-stage renal disease peritoneal dialysis ongoing followed by [facility] kidney.  Close monitoring of blood sugars with variables.  Therapy evaluations completed due to patient decreased functional mobility was admitted for a comprehensive rehab program.   Hospital Course: Andrea Stewart was admitted to rehab 09/30/2021 for inpatient therapies to consist of PT, ST and OT at least three hours five days a week. Past admission physiatrist, therapy team and rehab RN have worked together to provide customized collaborative inpatient rehab.  In regards to patient's nondisplaced right hip greater trochanteric fracture as well as right nondisplaced fracture of lateral humeral head secondary to fall.  Nonoperative.  Neurovascular sensation intact.  Weightbearing as tolerated and follow-up Dr. Earnestine Leys.  SCDs for DVT prophylaxis and patient remained on aspirin 81 mg twice daily.  Blood pressure remained somewhat soft Aldactone discontinued she remained on  Toprol as well as Lasix and would need outpatient follow-up.  Bouts of tachycardia no chest pain or increasing shortness of breath Lopressor as directed.  Diabetes mellitus with labile blood sugars and hemoglobin A1c 9.7 insulin therapy ongoing with assistance by diabetic coordinator and her Levemir was changed to insulin NPH.  Hyperlipidemia Lipitor ongoing.  History of COPD tobacco use oxygen saturations monitored patient receiving counts regards cessation of nicotine products.  Obesity BMI 40.07 dietary follow-up.  End-stage renal disease with hemodialysis as per renal services and latest creatinine 5.52.  Bouts of hyponatremia 127-128 and monitored with labs that were completed during peritoneal dialysis.   Blood pressures were monitored on TID basis and soft and monitored  Diabetes has been monitored with ac/hs CBG checks and SSI was use prn for tighter BS control and blood sugars remain variable.    Rehab course: During patient's stay in rehab weekly team conferences were held to monitor patient's progress, set goals and discuss barriers to discharge. At admission, patient required minimal guard sit to stand minimal assist stand pivot transfers moderate assist step pivot transfers minimal assist 15 feet hemiwalker  Physical exam.  Blood pressure 103/67 pulse 106 temperature 97.8 respirations 18 oxygen saturations 100% room air Constitutional.  No acute distress HEENT Head.  Normocephalic and atraumatic Eyes.  Pupils round and reactive to light no discharge without nystagmus Neck.  Supple nontender no JVD without thyromegaly Cardiac regular rate rhythm any extra sounds or murmur heard Abdomen.  Obese soft nontender positive bowel sounds Respiratory effort normal no respiratory distress without wheeze Skin.  PD catheter in place Neurologic.  Alert no acute distress follows full commands.  Right upper extremity right lower  extremity limited by pain.  Strength otherwise intact.  He/She  has  had improvement in activity tolerance, balance, postural control as well as ability to compensate for deficits. He/She has had improvement in functional use RUE/LUE  and RLE/LLE as well as improvement in awareness.  Patient transferred semireclined sitting edge of bed with head of bed elevated and use of bed rails with close supervision with encouragement to be independent.  Donned pants sitting edge of bed moderate assist and patient stood without assistive device contact-guard.  Ambulate rolling walker contact-guard close supervision in and out of the bathroom.  With encouragement patient able to manage clothing without assist and continent of bladder performed.  Care standing with close supervision.  Patient stood at the sink and washed her hands close supervision.  Ambulates short distances in the hallway with rolling walker and close supervision.  With ADLs she transfers to the therapy mat with minimal assist and use of rolling walker for support.  Full family teaching completed plan discharged to home.       Disposition: Discharged to home    Diet: 1200 mL fluid restriction  Special Instructions: No driving smoking or alcohol  Continue peritoneal dialysis as directed  Medications at discharge 1.  Tylenol as needed 2.  Xanax 0.5 mg p.o. nightly 3.  Elavil 10 mg p.o. nightly 4.  Aspirin 81 mg p.o. twice daily 5.  Lipitor 10 mg p.o. daily with supper 6.  Lasix 80 mg p.o. daily 7.  Aranesp 25 mcg every Thursday with PD 8.  Atarax 25 mg p.o. twice daily as needed anxiety 9.  Insulin NPH 16 units of breakfast 28 units nightly 10.  Magnesium oxide 800 mg p.o. twice daily 11.  Melatonin 3 mg p.o. nightly 12.  Toprol-XL 12.5 mg p.o. daily with breakfast 13.  Dulera 2 puffs twice daily 14.  Multivitamin daily 15.  Nitroglycerin as needed 16.  Protonix 40 mg p.o. twice daily 17.  Klor-Con 40 mg p.o. daily 18.  Vitamin D 50,000 units weekly 19.  Hydrocodone 1 tablet 3 times daily as  needed   30-35 minutes were spent completing discharge summary and discharge planning  Discharge Instructions     Ambulatory referral to Physical Medicine Rehab   Complete by: As directed    Moderate complexity follow up 1-2 weeks polytrauma        Follow-up Information     Raulkar, Clide Deutscher, MD Follow up.   Specialty: Physical Medicine and Rehabilitation Why: 12/15/21 please arrive at 1:00pm for 1:20pm appointment, thank you! Contact information: [address] [address] [phone]         Earnestine Leys, MD Follow up.   Specialty: Orthopedic Surgery Why: Call for appointment Contact information: [place] [address] [phone]                 Signed: Cathlyn Parsons 10/15/2021, 5:28 AM

## 2021-10-14 NOTE — Progress Notes (Addendum)
Inpatient Diabetes Program Recommendations  AACE/ADA: New Consensus Statement on Inpatient Glycemic Control (2015)  Target Ranges:  Prepandial:   less than 140 mg/dL      Peak postprandial:   less than 180 mg/dL (1-2 hours)      Critically ill patients:  140 - 180 mg/dL   Lab Results  Component Value Date   GLUCAP 237 (H) 10/14/2021   HGBA1C 9.7 (H) 09/21/2021    Review of Glycemic Control  Latest Reference Range & Units 10/13/21 17:13 10/13/21 22:25 10/13/21 23:31 10/14/21 00:47 10/14/21 06:14 10/14/21 08:53  Glucose-Capillary 70 - 99 mg/dL 158 (H) 400 (H) 457 (H) 418 (H) 359 (H) 313 (H)   Diabetes history: DM 2 Outpatient Diabetes medications:  NPH 20 units in the AM and NPH 35 units in the evening Current orders for Inpatient glycemic control:  Levemir 16 units in the AM and Levemir 34 units in the evening  Inpatient Diabetes Program Recommendations:    Note blood sugars lower during the day and seem to increase throughout the evening.   Consider adding Novolg 3 units with supper/dinner at 5 pm.  This may help prevent the peak in blood sugars that seems to be occurring at bedtime.  Also wonder if we should change Levemir to NPH since this is what patient was taking at home?    Thanks,  Adah Perl, RN, BC-ADM Inpatient Diabetes Coordinator Pager [phone]  (8a-5p)

## 2021-10-14 NOTE — Progress Notes (Signed)
Patient ID: Andrea Stewart, female   DOB: 23-Mar-1946, 75 y.o.   MRN: [id number] Team Conference Report to Patient/Family  Team Conference discussion was reviewed with the patient and caregiver, including goals, any changes in plan of care and target discharge date.  Patient and caregiver express understanding and are in agreement.  The patient has a target discharge date of 10/16/21.  Sw met with patient and called son at bedside. Pt ready for d/c on Friday. Patient son will be picking patient up. No additional questions or concerns Dyanne Iha 10/14/2021, 2:14 PM

## 2021-10-14 NOTE — Progress Notes (Signed)
[facility] NEPHROLOGY PROGRESS NOTE  Assessment/ Plan:  Dialysis Orders: Center: [facility]  on CCPD . EDW 73 kg, 5 exchanges, 2 liters fill, fill time 10 min, dwell time 1.5 hrs, drain time 20 minutes.   # Status post right hip and shoulder fractures: She continues to make improvements with non-surgical management in the [facility] inpatient rehabilitation with ongoing PT/OT.  Tentative discharge date set for 12/2.  #. ESRD: Continue current prescription of peritoneal dialysis alternating 4.25% and 2.5% dianeal solution for volume overload-she continues to feel better with ongoing ultrafiltration. D/w PD nurse.  Net UF around 1.3 L.  #. Anemia: Hemoglobin at goal, continue ESA and trend hemoglobin/hematocrit.    # CKD-MBD: Continue renal diet with ongoing lab monitoring-not on phosphorus binder.  # Nutrition: Continue renal diet/renal multivitamin and oral nutritional supplementation.  # Hypertension: Blood pressure trend improving with ultrafiltration.  Also on Lasix.  #Hyponatremia, hyper volume: Continue UF with PD and fluid restriction.  Monitor lab.  Subjective: Seen and examined.  No new event.  Doing PD well.  No issues.    Objective Vital signs in last 24 hours: Vitals:   10/13/21 2038 10/13/21 2228 10/14/21 0613 10/14/21 0700  BP: (!) 138/50 (!) 124/55 (!) 120/54 (!) 125/55  Pulse: 71 82 100 75  Resp: 18 18 17 18   Temp: 98.4 F (36.9 C) (!) 97.4 F (36.3 C) 98.6 F (37 C) (!) 97.5 F (36.4 C)  TempSrc: Oral Oral Oral Temporal  SpO2:  95% 98% 95%  Weight: 84.4 kg     Height: 4\' 11"  (1.499 m)      Weight change: -2 kg  Intake/Output Summary (Last 24 hours) at 10/14/2021 1251 Last data filed at 10/14/2021 1048 Gross per 24 hour  Intake 2043 ml  Output --  Net 2043 ml        Labs: Basic Metabolic Panel: Recent Labs  Lab 10/08/21 0515 10/12/21 0605  NA 128* 128*  K 4.8 5.0  CL 95* 93*  CO2 27 30  GLUCOSE 346* 284*  BUN 27* 25*   CREATININE 5.32* 5.52*  CALCIUM 8.0* 8.2*  PHOS 3.4  --     Liver Function Tests: Recent Labs  Lab 10/08/21 0515  ALBUMIN 1.5*    No results for input(s): LIPASE, AMYLASE in the last 168 hours. No results for input(s): AMMONIA in the last 168 hours. CBC: Recent Labs  Lab 10/12/21 0605  WBC 5.9  HGB 11.1*  HCT 35.3*  MCV 97.2  PLT 268    Cardiac Enzymes: No results for input(s): CKTOTAL, CKMB, CKMBINDEX, TROPONINI in the last 168 hours. CBG: Recent Labs  Lab 10/13/21 2331 10/14/21 0047 10/14/21 0614 10/14/21 0853 10/14/21 1136  GLUCAP 457* 418* 359* 313* 237*     Iron Studies: No results for input(s): IRON, TIBC, TRANSFERRIN, FERRITIN in the last 72 hours. Studies/Results: No results found.  Medications: Infusions:  dialysis solution 2.5% low-MG/low-CA     dialysis solution 4.25% low-MG/low-CA      Scheduled Medications:  ALPRAZolam  0.5 mg Oral QHS   amitriptyline  10 mg Oral QHS   aspirin EC  81 mg Oral BID   atorvastatin  10 mg Oral Q supper   darbepoetin (ARANESP) injection - NON-DIALYSIS  25 mcg Subcutaneous Q Thu-1800   furosemide  80 mg Oral Daily   gentamicin cream  1 application Topical Daily   HYDROcodone-acetaminophen  1 tablet Oral TID   insulin aspart  3 Units Subcutaneous Q supper   [  START ON 10/15/2021] insulin NPH Human  16 Units Subcutaneous QAC breakfast   insulin NPH Human  34 Units Subcutaneous QHS   magnesium oxide  800 mg Oral BID   melatonin  3 mg Oral QHS   metoprolol succinate  25 mg Oral Q breakfast   mometasone-formoterol  2 puff Inhalation BID   multivitamin with minerals  1 tablet Oral Daily   pantoprazole  40 mg Oral BID   potassium chloride  40 mEq Oral Daily   Vitamin D (Ergocalciferol)  50,000 Units Oral Weekly    have reviewed scheduled and prn medications.  Physical Exam: General: Sitting on chair comfortable, not in distress Heart:RRR, s1s2 nl Lungs: Clear b/l, no crackle Abdomen:soft, Non-tender,  non-distended Extremities: Some pitting edema present in lower extremities Dialysis Access: PD catheter in place.  Trameka Dorough Prasad Lesean Woolverton 10/14/2021,12:51 PM  LOS: 14 days

## 2021-10-14 NOTE — Progress Notes (Signed)
Occupational Therapy Discharge Summary  Patient Details  Name: Andrea Stewart MRN: [id number] Date of Birth: 12-06-1945  Patient has met 9 of 10 long term goals due to improved activity tolerance, improved balance, postural control, ability to compensate for deficits, functional use of  RIGHT upper extremity, improved attention, and improved coordination.  Patient to discharge at overall Supervision-min assist level.  Patient's husband is unable to provide physical assist, however pt has multiple sons available to provide the necessary supervision-min assistance at discharge.    Reasons goals not met: Pt unwilling to complete shower transfers during ADL sessions or family education session except for on last day of therapy, which impacted the ability to meet the goal at a supervision level only, however did complete the shower transfer at a CGA level and is satisfactory with pt's available assistance at home for d/c.  Recommendation:  Patient will benefit from ongoing skilled OT services in home health setting to continue to advance functional skills in the area of BADL and Reduce care partner burden.  Equipment: Already has BSC and shower chair  Reasons for discharge: treatment goals met  Patient/family agrees with progress made and goals achieved: Yes  OT Discharge Precautions/Restrictions  Precautions Precautions: Fall Precaution Comments: WBAT on RLE and RUE, RUE sling for comfort Required Braces or Orthoses: Sling Restrictions Weight Bearing Restrictions: No RUE Weight Bearing: Weight bearing as tolerated LUE Weight Bearing: Weight bearing as tolerated RLE Weight Bearing: Weight bearing as tolerated LLE Weight Bearing: Weight bearing as tolerated Other Position/Activity Restrictions: RUE sling for comfort ADL ADL Equipment Provided: Long-handled sponge Eating: Modified independent Where Assessed-Eating: Edge of bed Grooming: Modified independent Where Assessed-Grooming:  Sitting at sink Upper Body Bathing: Supervision/safety Where Assessed-Upper Body Bathing: Sitting at sink Lower Body Bathing: Supervision/safety Where Assessed-Lower Body Bathing: Sitting at sink Upper Body Dressing: Supervision/safety Where Assessed-Upper Body Dressing: Sitting at sink Lower Body Dressing: Contact guard Where Assessed-Lower Body Dressing: Edge of bed Toileting: Contact guard Where Assessed-Toileting: Glass blower/designer: Close supervision Toilet Transfer Method: Ambulating (RW) Tub/Shower Transfer Method: Unable to assess Social research officer, government: Close supervision (RW) Social research officer, government Method: Heritage manager: Radio broadcast assistant, Grab bars Vision Baseline Vision/History: 1 Wears glasses Patient Visual Report: No change from baseline Vision Assessment?: No apparent visual deficits Perception  Perception: Within Functional Limits Praxis Praxis: Intact Cognition Overall Cognitive Status: Within Functional Limits for tasks assessed Arousal/Alertness: Awake/alert Orientation Level: Oriented X4 Year: 2022 Month: December Day of Week: Correct Memory: Appears intact Immediate Memory Recall: Sock;Blue;Bed Memory Recall Sock: Without Cue Memory Recall Blue: Without Cue Memory Recall Bed: With Cue Awareness: Impaired Problem Solving: Impaired Safety/Judgment: Appears intact Sensation Sensation Light Touch: Appears Intact Proprioception: Appears Intact Coordination Gross Motor Movements are Fluid and Coordinated: No Fine Motor Movements are Fluid and Coordinated: No Coordination and Movement Description: Coordination deficits in R hand due to pain and swelling Finger Nose Finger Test: increased pain limiting RUE, however tremors and dysmetria in LUE Motor  Motor Motor: Within Functional Limits Motor - Skilled Clinical Observations: generalized weakness/deconditioning, decreased balance, and pain Mobility  Bed Mobility Bed  Mobility: Rolling Right;Rolling Left;Supine to Sit;Sit to Supine Rolling Right: Supervision/verbal cueing Rolling Left: Supervision/Verbal cueing Supine to Sit: Supervision/Verbal cueing Sit to Supine: Supervision/Verbal cueing Transfers Sit to Stand: Independent with assistive device Stand to Sit: Independent with assistive device  Trunk/Postural Assessment  Cervical Assessment Cervical Assessment: Exceptions to [facility] (forward head) Thoracic Assessment Thoracic Assessment: Exceptions to [facility] (kyphosis) Lumbar Assessment Lumbar  Assessment: Exceptions to [facility] (posterior pelvic tilt) Postural Control Postural Control: Within Functional Limits  Balance Balance Balance Assessed: Yes Static Sitting Balance Static Sitting - Balance Support: Feet supported;Bilateral upper extremity supported Static Sitting - Level of Assistance: 7: Independent Dynamic Sitting Balance Dynamic Sitting - Balance Support: Feet supported;No upper extremity supported Dynamic Sitting - Level of Assistance: 6: Modified independent (Device/Increase time) Static Standing Balance Static Standing - Balance Support: Bilateral upper extremity supported (RW) Static Standing - Level of Assistance: 6: Modified independent (Device/Increase time) Dynamic Standing Balance Dynamic Standing - Balance Support: Bilateral upper extremity supported (RW) Dynamic Standing - Level of Assistance: 6: Modified independent (Device/Increase time) Extremity/Trunk Assessment RUE Assessment RUE Assessment: Exceptions to [facility] General Strength Comments: Able to use RUE as gross assist on RW and during self-care tasks, limited to <90 degrees AROM shoulder flexion, full elbow flexion available pain permitting LUE Assessment LUE Assessment: Within Functional Limits   Soul Hackman E Demarlo Riojas 10/14/2021, 3:52 PM

## 2021-10-14 NOTE — Evaluation (Signed)
Recreational Therapy Assessment and Plan  Patient Details  Name: Andrea Stewart MRN: [id number] Date of Birth: 1946/10/18 Today's Date: 10/14/2021  Rehab Potential:  Good ELOS:   d/c 12/2  Assessment Hospital Problem: Principal Problem:   Closed right hip fracture (HCC) Active Problems:   Intertrochanteric fracture of right hip (HCC)     Past Medical History:      Past Medical History:  Diagnosis Date   Backache, unspecified      s/p c-spine and l-spine fusions   COPD (chronic obstructive pulmonary disease) (HCC)     Degeneration of intervertebral disc, site unspecified     Depressive disorder, not elsewhere classified     Disorder of bone and cartilage, unspecified     Family history of adverse reaction to anesthesia      mother did, but patient not sure of the complication   HTN (hypertension)     Hx of left heart catheterization by cutdown      a. 7 years ago; no significant cad; b. Lexiscan 2014: no significant ischemia, no significant EKG changes concerning for ischemia, EF 67%, overall low risk study   Kidney failure      Dr Caryl Bis   Myalgia and myositis, unspecified     Obesity, unspecified     Personal history of tobacco use, presenting hazards to health      1/2 ppd   Plantar fascial fibromatosis     S/P cholecystectomy     S/p nephrectomy     Type II or unspecified type diabetes mellitus without mention of complication, not stated as uncontrolled     Unspecified essential hypertension     Unspecified hereditary and idiopathic peripheral neuropathy      secondary to diabetes    Past Surgical History:       Past Surgical History:  Procedure Laterality Date   ABDOMINAL HYSTERECTOMY       APPENDECTOMY       BACK SURGERY        1966 and 2006    CARDIAC CATHETERIZATION        o.k. 2003   CARPAL TUNNEL RELEASE       CERVICAL DISCECTOMY       CHOLECYSTECTOMY       COLONOSCOPY   09/1999    colonoscopy-hemorrhoids, re-check 5 years (10/2006)   dexa         osteopenia (01/2004)   DIALYSIS/PERMA CATHETER INSERTION Right 01/05/2021    Procedure: DIALYSIS/PERMA CATHETER INSERTION;  Surgeon: Annice Needy, MD;  Location: [facility] INVASIVE CV LAB;  Service: Cardiovascular;  Laterality: Right;   DIALYSIS/PERMA CATHETER REMOVAL N/A 02/05/2021    Procedure: DIALYSIS/PERMA CATHETER REMOVAL;  Surgeon: Annice Needy, MD;  Location: [facility] INVASIVE CV LAB;  Service: Cardiovascular;  Laterality: N/A;   ELBOW SURGERY       ESOPHAGOGASTRODUODENOSCOPY (EGD) WITH PROPOFOL N/A 07/23/2020    Procedure: ESOPHAGOGASTRODUODENOSCOPY (EGD) WITH PROPOFOL;  Surgeon: Earline Mayotte, MD;  Location: [facility] ENDOSCOPY;  Service: Endoscopy;  Laterality: N/A;  TRAVEL TO O.R. - PATIENT TO HAVE SURGERY   FEMUR IM NAIL Left 01/01/2021    Procedure: INTRAMEDULLARY (IM) NAIL FEMORAL;  Surgeon: Lyndle Herrlich, MD;  Location: [facility] ORS;  Service: Orthopedics;  Laterality: Left;   FOOT SURGERY        x 3   HIP FRACTURE SURGERY Left     JOINT REPLACEMENT       KIDNEY DONATION   1991   LAPAROSCOPIC ABDOMINAL EXPLORATION N/A 07/23/2020  Procedure: LAPAROSCOPIC ABDOMINAL EXPLORATION;  Surgeon: Robert Bellow, MD;  Location: [place] [facility];  Service: General;  Laterality: N/A;   LUMBAR FUSION       NECK SURGERY        06/2005   Problem with general Anesthesia        02/2006   REPLACEMENT TOTAL KNEE   10/2004   trigger finger surgery       UPPER GI ENDOSCOPY N/A 07/23/2020    Procedure: UPPER GI ENDOSCOPY;  Surgeon: Robert Bellow, MD;  Location: [facility] ORS;  Service: General;  Laterality: N/A;   VENTRAL HERNIA REPAIR N/A 09/08/2017    Primary repair of a 10 x 15 mm infraumbilical fascial defect.  Surgeon: Robert Bellow, MD;  Location: [facility] ORS;  Service: General;  Laterality: N/A;   VESICOVAGINAL FISTULA CLOSURE W/ TAH       VIDEO BRONCHOSCOPY Bilateral 04/02/2015    Procedure: VIDEO BRONCHOSCOPY WITHOUT FLUORO;  Surgeon: Juanito Doom, MD;  Location: [facility] ENDOSCOPY;  Service:  Cardiopulmonary;  Laterality: Bilateral;      Assessment & Plan Clinical Impression: Patient is a 75 y.o. year old female with history of COPD/tobacco use, hypertension, diabetes mellitus, obesity with BMI 39.27, end-stage renal disease peritoneal dialysis.  Per chart review patient lives with spouse.  1 level home with ramped entrance.  She has 4 sons and an aide to help with ADLs.  Modified independent for mobility prior to admission.  Her husband is currently [facility] to [facility] for intraparenchymal hemorrhage.  Presented to [facility] 09/21/2021 after mechanical fall when she stumbled and fell on her right side.  No loss of consciousness.  Denied any presyncope chest pain or palpitations related to the fall.  Cranial CT scan negative.  CT of the right hip showed undisplaced right femoral intertrochanteric fracture as well as CT of the right shoulder showing nondisplaced fracture of the lateral humeral head with involvement of the greater tuberosity.  Old healed fracture of the humeral diaphysis.  Admission chemistry sodium 134 BUN 33 creatinine 7.53, hemoglobin 13.1 WBC 12,900, hemoglobin A1c 9.7.  Orthopedic service follow-up Dr. Earnestine Leys no current surgical intervention and weightbearing as tolerated right upper extremity and weightbearing as tolerated right lower extremity with right upper extremity sling for comfort.  In regards to patient's end-stage renal disease follow-up nephrology services/[facility] with peritoneal dialysis ongoing.  Close monitoring of blood sugars insulin therapy as directed.  Therapy evaluations completed due to patient decreased functional mobility was admitted for a comprehensive rehab program.  Met with pt today to discuss TR services, leisure education, activity analysis/modifications and stress management.  Pt presents with decreased activity tolerance, decreased functional mobility, decreased balance, feelings of stress Limiting pt's independence  with leisure/community pursuits.  Session note:  C/o right arm pain, requesting pillows for elevation- provided some relief. Pt actively participated in stress managment/coping group today.  Pt education/discussion focused on stress exploration including factors that contribute to stress, factors that protect against stress and potential coping strategies.  Coping strategies included deep breathing, progressive muscle relaxation, imagery & challenging irrational thoughts.  Handouts provided and pt appreciative of this session.  Plan  No further TR as pt has d/c 12/2  Recommendations for other services: None   Discharge Criteria: Patient will be discharged from TR if patient refuses treatment 3 consecutive times without medical reason.  If treatment goals not met, if there is a change in medical status, if patient makes no progress towards goals  or if patient is discharged from hospital.  The above assessment, treatment plan, treatment alternatives and goals were discussed and mutually agreed upon: by patient  [place] 10/14/2021, 4:21 PM

## 2021-10-14 NOTE — Progress Notes (Signed)
PROGRESS NOTE   Subjective/Complaints: Ambulating well in hallway today CBGs 158-457, discussed with diabetes coordinator and received recommendation to change insulin to NPH which she was on at home.    ROS:   Pt denies SOB, abd pain, CP, N/V/C/D, and vision changes, +right shoulder pain, +malaise   Objective:   No results found. Recent Labs    10/12/21 0605  WBC 5.9  HGB 11.1*  HCT 35.3*  PLT 268    Recent Labs    10/12/21 0605  NA 128*  K 5.0  CL 93*  CO2 30  GLUCOSE 284*  BUN 25*  CREATININE 5.52*  CALCIUM 8.2*      Intake/Output Summary (Last 24 hours) at 10/14/2021 1354 Last data filed at 10/14/2021 1309 Gross per 24 hour  Intake 2046 ml  Output --  Net 2046 ml        Physical Exam: Vital Signs Blood pressure (!) 125/55, pulse 75, temperature (!) 97.5 F (36.4 C), temperature source Temporal, resp. rate 18, height 4\' 11"  (1.499 m), weight 84.4 kg, SpO2 95 %. General: drowsy this morning due to low CBG, still interactive HENT: conjugate gaze; oropharynx moist CV: Bradycardic Pulmonary: CTA B/L; no W/R/R- good air movement GI: soft, NT, ND, (+)BS- PD catheter in place.  Psychiatric: appropriate; slightly more interactive, anxious Neurological: vague, but appropriate .  Ext: no clubbing, cyanosis, RUE trace distal edema Psych: pleasant and cooperative  Skin: dry Musc: RUE edema. Proximal RUE/RLE tenderness. Sling in place. Ambulating well in hallway.  Neuro: Alert RUE/RLE: still somewhat limited due to pain, stable > proximally  Assessment/Plan: 1. Functional deficits which require 3+ hours per day of interdisciplinary therapy in a comprehensive inpatient rehab setting. Physiatrist is providing close team supervision and 24 hour management of active medical problems listed below. Physiatrist and rehab team continue to assess barriers to discharge/monitor patient progress toward  functional and medical goals  Care Tool:  Bathing    Body parts bathed by patient: Right arm, Chest, Abdomen, Front perineal area, Buttocks, Right upper leg, Left upper leg, Face   Body parts bathed by helper: Left arm, Right lower leg, Left lower leg     Bathing assist Assist Level: Minimal Assistance - Patient > 75%     Upper Body Dressing/Undressing Upper body dressing   What is the patient wearing?: Pull over shirt    Upper body assist Assist Level: Minimal Assistance - Patient > 75%    Lower Body Dressing/Undressing Lower body dressing      What is the patient wearing?: Underwear/pull up, Pants     Lower body assist Assist for lower body dressing: Moderate Assistance - Patient 50 - 74%     Toileting Toileting    Toileting assist Assist for toileting: Minimal Assistance - Patient > 75%     Transfers Chair/bed transfer  Transfers assist     Chair/bed transfer assist level: Contact Guard/Touching assist Chair/bed transfer assistive device: Programmer, multimedia   Ambulation assist      Assist level: Contact Guard/Touching assist Assistive device: Walker-rolling Max distance: 71ft   Walk 10 feet activity   Assist     Assist level: Contact  Guard/Touching assist Assistive device: Walker-rolling   Walk 50 feet activity   Assist Walk 50 feet with 2 turns activity did not occur: Safety/medical concerns  Assist level: Contact Guard/Touching assist Assistive device: Walker-rolling    Walk 150 feet activity   Assist Walk 150 feet activity did not occur: Safety/medical concerns         Walk 10 feet on uneven surface  activity   Assist Walk 10 feet on uneven surfaces activity did not occur: Safety/medical concerns (pain, fatigue, weakness, decreased balance)   Assist level: Contact Guard/Touching assist Assistive device: Walker-rolling   Wheelchair     Assist Is the patient using a wheelchair?: Yes Type of Wheelchair:  Manual Wheelchair activity did not occur: Safety/medical concerns (pain, fatigue, weakness, decreased balance)  Wheelchair assist level: Dependent - Patient 0%      Wheelchair 50 feet with 2 turns activity    Assist    Wheelchair 50 feet with 2 turns activity did not occur: Safety/medical concerns (pain, fatigue, weakness, decreased balance)       Wheelchair 150 feet activity     Assist  Wheelchair 150 feet activity did not occur: Safety/medical concerns (pain, fatigue, weakness, decreased balance)       Blood pressure (!) 125/55, pulse 75, temperature (!) 97.5 F (36.4 C), temperature source Temporal, resp. rate 18, height 4\' 11"  (1.499 m), weight 84.4 kg, SpO2 95 %.    Medical Problem List and Plan: 1.  Nondisplaced right hip greater trochanteric fracture as well as right nondisplaced fracture of the lateral humeral head secondary to fall.  Nonoperative.  Follow-up Dr. Earnestine Leys.  Weightbearing as tolerated.  Shoulder sling for comfort.  Continue CIR- PT, OT  2.  Antithrombotics: -DVT/anticoagulation:  Mechanical:  Antiembolism stockings, knee (TED hose) Bilateral lower extremities.               -antiplatelet therapy: N/A 3. Postoperative pain: Continue hydrocodone TID 4. Anxiety: Continue Xanax 0.5 mg nightly, Vistaril 25 mg twice daily as needed, trazodone as needed. Please place on Dr. Ferne Coe schedule             -antipsychotic agents: N/A 5. Neuropsych: This patient is capable of making decisions on her own behalf. 6. Skin/Wound Care: Routine skin checks 7. Fluids/Electrolytes/Nutrition: Routine in and outs 8.  End-stage renal disease on PD.  Continue peritoneal dialysis 9.  Anemia of chronic disease.    Hemoglobin 10.1 on 11/19  Continue to monitor 10.  Hypertension, but currently soft. D/c aldactone. Toprol-XL 25 mg daily, Lasix 80 mg daily.   11.  Diabetes mellitus with hyperglycemia.  Hemoglobin A1c 9.7. decrease NovoLog to 3 units 3 times daily,  Increase Levemir to 16 units twice daily.    CBG (last 3)  Recent Labs    10/14/21 0614 10/14/21 0853 10/14/21 1136  GLUCAP 359* 313* 237*    11/23 still poor but improving control. Will adjust NPH to 20u qam and 35u qpm per diabetes coord recs.  11/24- Bgs slightly better- will wait to change til tomorrow, since just changed yesterday.   11/25- CBG's actually have dropped a little low- 60-186- will decrease Levemir to 18 units in AM and 34 units in evening.   11/26: d/c ISS coverage since hypoglycemic to 50. Keep checking CBGs AC/HS so we can adjust regimen accordingly.  11/27: CBG down to 27: glucose gel given with improvement to 40, another given with increase to 72, d/c Novolog with meals. If CBGs high tomorrow, d/c feeding supplement  11/29: Hypoglycemic to 51 yesterday evening: decrease daytime Levemir to 16U.   11/30: change insulin to NPH and add Novolog 3U after supper 12.  Hyperlipidemia.  LDL 53 on 10/18, HDL 60,  VLDL 22, Lipitor, TG 111. Will discuss with son decreasing Lipitor dose given her fatigue, pain, and brain fog. Decrease Lipitor to 10mg . Recheck lipid panel in 3 months.  13.  COPD with tobacco abuse.  Continue nebulizers as directed.  Check oxygen saturations every shift.  Provide counsel guards to cessation of nicotine products. 14.  Obesity.  BMI 40.07  Dietary follow-up 15. Tachycardia: now bradycardic, decrease lopressor to 25mg  16. Hypomagnesemia: will be managed by nephrology while she is inpatient 17. Hyponatremia: likely secondary to volume overload as per nephrology. Improved to 127 on 11/19. Monitor with dialysis.   11/24- Na up slightly to 128 today- con't to monitor  11/28: Na stable at 128- continue to monitor with labs.  18.  Sleep disturbance: started Amitriptyline 10mg  HS. Schedule outpatient sleep study  Appears to be improving 19. Hypotension: maintain lasix and lopressor given swelling and tachycardia, continue to monitor BP TID 20. Disposition: HFU  scheduled with Dr. Ranell Patrick   LOS: 14 days A FACE TO Susan Moore 10/14/2021, 1:54 PM

## 2021-10-15 DIAGNOSIS — I120 Hypertensive chronic kidney disease with stage 5 chronic kidney disease or end stage renal disease: Secondary | ICD-10-CM | POA: Diagnosis not present

## 2021-10-15 DIAGNOSIS — E1129 Type 2 diabetes mellitus with other diabetic kidney complication: Secondary | ICD-10-CM | POA: Diagnosis not present

## 2021-10-15 DIAGNOSIS — S72001A Fracture of unspecified part of neck of right femur, initial encounter for closed fracture: Secondary | ICD-10-CM | POA: Diagnosis not present

## 2021-10-15 DIAGNOSIS — N186 End stage renal disease: Secondary | ICD-10-CM | POA: Diagnosis not present

## 2021-10-15 DIAGNOSIS — Z992 Dependence on renal dialysis: Secondary | ICD-10-CM | POA: Diagnosis not present

## 2021-10-15 LAB — GLUCOSE, CAPILLARY
Glucose-Capillary: 304 mg/dL — ABNORMAL HIGH (ref 70–99)
Glucose-Capillary: 42 mg/dL — CL (ref 70–99)
Glucose-Capillary: 86 mg/dL (ref 70–99)
Glucose-Capillary: 87 mg/dL (ref 70–99)
Glucose-Capillary: 142 mg/dL — ABNORMAL HIGH (ref 70–99)
Glucose-Capillary: 277 mg/dL — ABNORMAL HIGH (ref 70–99)
Glucose-Capillary: 423 mg/dL — ABNORMAL HIGH (ref 70–99)

## 2021-10-15 MED ORDER — INSULIN ASPART 100 UNIT/ML IJ SOLN
0.0000 [IU] | Freq: Three times a day (TID) | INTRAMUSCULAR | Status: DC
  Administered 2021-10-15 – 2021-10-16 (×2): 3 [IU] via SUBCUTANEOUS
  Administered 2021-10-16: 6 [IU] via SUBCUTANEOUS

## 2021-10-15 MED ORDER — GLUCOSE 40 % PO GEL
2.0000 | ORAL | Status: AC
  Administered 2021-10-15: 62 g via ORAL

## 2021-10-15 MED ORDER — GLUCOSE 40 % PO GEL
ORAL | Status: AC
  Filled 2021-10-15: qty 1

## 2021-10-15 MED ORDER — METOPROLOL SUCCINATE ER 25 MG PO TB24
12.5000 mg | ORAL_TABLET | Freq: Every day | ORAL | Status: DC
  Administered 2021-10-16: 12.5 mg via ORAL
  Filled 2021-10-15: qty 1

## 2021-10-15 MED ORDER — INSULIN ASPART 100 UNIT/ML IJ SOLN: 0.0000 [IU] | Freq: Three times a day (TID) | INTRAMUSCULAR | Status: DC

## 2021-10-15 MED ORDER — INSULIN NPH (HUMAN) (ISOPHANE) 100 UNIT/ML ~~LOC~~ SUSP
28.0000 [IU] | Freq: Every day | SUBCUTANEOUS | Status: DC
  Administered 2021-10-15: 28 [IU] via SUBCUTANEOUS

## 2021-10-15 NOTE — Progress Notes (Signed)
Occupational Therapy Session Note  Patient Details  Name: Andrea Stewart MRN: [id number] Date of Birth: 02-16-1946  Today's Date: 10/15/2021 OT Group Time: 1430-1530 OT Group Time Calculation (min): 60 min   Short Term Goals: Week 2:  OT Short Term Goal 1 (Week 2): STG = LTG due to ELOS  Skilled Therapeutic Interventions/Progress Updates:  Pt participate in group session with a focus on therapeutic activity of decorating christmas trees in therapy gym to facilitate magagement of w/c, safety awareness,  BUE coordination, social interaction, dynamic sitting balance and increasing overall activity tolerance. Pt opted to sit to down ornaments on trees but pt able to use BUEs to reach towards trees providing AAROM to RUE to place ornaments on trees. Education provided on safety awareness in relation to remembering to lock brakes when reaching out of BOS. Education provided on energy conservation strategies during group as completing this therapeutic activities requires a significant amount of energy. Pt verbalized understanding of education and able to self regulate her energy as needed as pt did report needing a rest break d/t fatigue. Pt interacting with other group members and contributing to group conservations.  Pt reported need to void bladder during session, ambulatory toilet transfer with rw and CGA. Herminie for 3/3 toileting tasks needing assist to pull up pants on R side. Pt exited bathroom with rw and CGA. Pt transported back to room by RT  Therapy Documentation Precautions:  Precautions Precautions: Fall Precaution Comments: WBAT on RLE and RUE, RUE sling for comfort Required Braces or Orthoses: Sling (for comfort) Restrictions Weight Bearing Restrictions: No RUE Weight Bearing: Weight bearing as tolerated LUE Weight Bearing: Weight bearing as tolerated RLE Weight Bearing: Weight bearing as tolerated LLE Weight Bearing: Weight bearing as tolerated Other Position/Activity  Restrictions: RUE sling for comfort  Pain: unrated pain reported in RUE, rest breaks and compensatory strategies offered as needed.      Therapy/Group: Group Therapy  Precious Haws 10/15/2021, 4:23 PM

## 2021-10-15 NOTE — Progress Notes (Signed)
[facility] NEPHROLOGY PROGRESS NOTE  Assessment/ Plan:  Dialysis Orders: Center: [facility]  on [facility] . EDW 73 kg, 5 exchanges, 2 liters fill, fill time 10 min, dwell time 1.5 hrs, drain time 20 minutes.   # Status post right hip and shoulder fractures: She continues to make improvements with non-surgical management in the [facility] inpatient rehabilitation with ongoing PT/OT.  Tentative discharge date set for 12/2.  #. ESRD: Continue current prescription of peritoneal dialysis alternating 4.25% and 2.5% dianeal solution for volume overload-she continues to feel better with ongoing ultrafiltration. D/w PD nurse.  Net UF around 1.3 L.  Continue current PD prescription.  #. Anemia: Hemoglobin at goal, continue ESA and trend hemoglobin/hematocrit.    # CKD-MBD: Continue renal diet with ongoing lab monitoring-not on phosphorus binder.  # Nutrition: Continue renal diet/renal multivitamin and oral nutritional supplementation.  # Hypertension: Blood pressure trend improving with ultrafiltration.  Also on Lasix.  #Hyponatremia, hyper volume: Continue UF with PD and fluid restriction.  Monitor lab.  Subjective: Seen and examined.   Reports feeling nauseated.  Denied chest pain, shortness of breath, no new event.  No issue with PD.  Objective Vital signs in last 24 hours: Vitals:   10/14/21 1955 10/14/21 2010 10/15/21 0600 10/15/21 0704  BP:  (!) 146/66 138/60 131/82  Pulse: 75 78 76 67  Resp: 15 18 18 18   Temp:  98 F (36.7 C) 98.2 F (36.8 C) 97.9 F (36.6 C)  TempSrc:  Oral Oral Oral  SpO2: 96% 97% 98% 98%  Weight:   81.6 kg   Height:       Weight change: -1.4 kg  Intake/Output Summary (Last 24 hours) at 10/15/2021 1134 Last data filed at 10/15/2021 0600 Gross per 24 hour  Intake 10345 ml  Output 11357 ml  Net -1012 ml        Labs: Basic Metabolic Panel: Recent Labs  Lab 10/12/21 0605  NA 128*  K 5.0  CL 93*  CO2 30  GLUCOSE 284*  BUN 25*   CREATININE 5.52*  CALCIUM 8.2*    Liver Function Tests: No results for input(s): AST, ALT, ALKPHOS, BILITOT, PROT, ALBUMIN in the last 168 hours.  No results for input(s): LIPASE, AMYLASE in the last 168 hours. No results for input(s): AMMONIA in the last 168 hours. CBC: Recent Labs  Lab 10/12/21 0605  WBC 5.9  HGB 11.1*  HCT 35.3*  MCV 97.2  PLT 268    Cardiac Enzymes: No results for input(s): CKTOTAL, CKMB, CKMBINDEX, TROPONINI in the last 168 hours. CBG: Recent Labs  Lab 10/14/21 1136 10/14/21 1632 10/14/21 2125 10/15/21 0725 10/15/21 1114  GLUCAP 237* 145* 403* 304* 42*     Iron Studies: No results for input(s): IRON, TIBC, TRANSFERRIN, FERRITIN in the last 72 hours. Studies/Results: No results found.  Medications: Infusions:  dialysis solution 2.5% low-MG/low-CA     dialysis solution 4.25% low-MG/low-CA      Scheduled Medications:  ALPRAZolam  0.5 mg Oral QHS   amitriptyline  10 mg Oral QHS   aspirin EC  81 mg Oral BID   atorvastatin  10 mg Oral Q supper   darbepoetin (ARANESP) injection - NON-DIALYSIS  25 mcg Subcutaneous Q Thu-1800   furosemide  80 mg Oral Daily   gentamicin cream  1 application Topical Daily   HYDROcodone-acetaminophen  1 tablet Oral TID   insulin aspart  3 Units Subcutaneous Q supper   insulin NPH Human  16 Units Subcutaneous QAC  breakfast   insulin NPH Human  34 Units Subcutaneous QHS   magnesium oxide  800 mg Oral BID   melatonin  3 mg Oral QHS   metoprolol succinate  25 mg Oral Q breakfast   mometasone-formoterol  2 puff Inhalation BID   multivitamin with minerals  1 tablet Oral Daily   pantoprazole  40 mg Oral BID   potassium chloride  40 mEq Oral Daily   Vitamin D (Ergocalciferol)  50,000 Units Oral Weekly    have reviewed scheduled and prn medications.  Physical Exam: General: Sitting on chair comfortable, not in distress Heart:RRR, s1s2 nl Lungs: Clear b/l, no crackle Abdomen:soft, Non-tender,  non-distended Extremities: Some pitting edema present in lower extremities, stable Dialysis Access: PD catheter in place.  Andrea Stewart Andrea Stewart 10/15/2021,11:34 AM  LOS: 15 days

## 2021-10-15 NOTE — Progress Notes (Signed)
PROGRESS NOTE   Subjective/Complaints: CBG 42-403: insulin adjusted again today by diabetes coordinator She felt well this morning prior to hypoglycemia- was flushed at that time  ROS:   Pt denies SOB, abd pain, CP, N/V/C/D, and vision changes, +right shoulder pain, +malaise   Objective:   No results found. No results for input(s): WBC, HGB, HCT, PLT in the last 72 hours.   No results for input(s): NA, K, CL, CO2, GLUCOSE, BUN, CREATININE, CALCIUM in the last 72 hours.     Intake/Output Summary (Last 24 hours) at 10/15/2021 1309 Last data filed at 10/15/2021 0600 Gross per 24 hour  Intake 10105 ml  Output 11357 ml  Net -1252 ml        Physical Exam: Vital Signs Blood pressure (!) 85/55, pulse 70, temperature 97.6 F (36.4 C), temperature source Oral, resp. rate 18, height 4\' 11"  (1.499 m), weight 81.6 kg, SpO2 100 %. General: drowsy this morning due to low CBG, still interactive HENT: conjugate gaze; oropharynx moist CV: Bradycardic, hypotensive Pulmonary: CTA B/L; no W/R/R- good air movement GI: soft, NT, ND, (+)BS- PD catheter in place.  Psychiatric: appropriate; slightly more interactive, anxious Neurological: vague, but appropriate .  Ext: no clubbing, cyanosis, RUE trace distal edema Psych: pleasant and cooperative  Skin: dry Musc: RUE edema. Proximal RUE/RLE tenderness. Sling in place. Ambulating well in hallway.  Neuro: Alert RUE/RLE: still somewhat limited due to pain, stable > proximally  Assessment/Plan: 1. Functional deficits which require 3+ hours per day of interdisciplinary therapy in a comprehensive inpatient rehab setting. Physiatrist is providing close team supervision and 24 hour management of active medical problems listed below. Physiatrist and rehab team continue to assess barriers to discharge/monitor patient progress toward functional and medical goals  Care Tool:  Bathing     Body parts bathed by patient: Right arm, Chest, Abdomen, Front perineal area, Buttocks, Right upper leg, Left upper leg, Face   Body parts bathed by helper: Left arm, Right lower leg, Left lower leg     Bathing assist Assist Level: Minimal Assistance - Patient > 75%     Upper Body Dressing/Undressing Upper body dressing   What is the patient wearing?: Pull over shirt    Upper body assist Assist Level: Minimal Assistance - Patient > 75%    Lower Body Dressing/Undressing Lower body dressing      What is the patient wearing?: Underwear/pull up, Pants     Lower body assist Assist for lower body dressing: Moderate Assistance - Patient 50 - 74%     Toileting Toileting    Toileting assist Assist for toileting: Minimal Assistance - Patient > 75%     Transfers Chair/bed transfer  Transfers assist     Chair/bed transfer assist level: Independent with assistive device Chair/bed transfer assistive device: Museum/gallery exhibitions officer assist      Assist level: Supervision/Verbal cueing Assistive device: Walker-rolling Max distance: 74ft   Walk 10 feet activity   Assist     Assist level: Supervision/Verbal cueing Assistive device: Walker-rolling   Walk 50 feet activity   Assist Walk 50 feet with 2 turns activity did not occur: Safety/medical concerns  Assist level: Contact Guard/Touching assist Assistive device: Walker-rolling    Walk 150 feet activity   Assist Walk 150 feet activity did not occur: Safety/medical concerns (fatigue, weakness, deconditioning)         Walk 10 feet on uneven surface  activity   Assist Walk 10 feet on uneven surfaces activity did not occur: Safety/medical concerns (pain, fatigue, weakness, decreased balance)   Assist level: Contact Guard/Touching assist Assistive device: Walker-rolling   Wheelchair     Assist Is the patient using a wheelchair?: Yes Type of Wheelchair: Manual Wheelchair  activity did not occur: Safety/medical concerns (pain, fatigue, weakness, decreased balance)  Wheelchair assist level: Supervision/Verbal cueing Max wheelchair distance: 29ft    Wheelchair 50 feet with 2 turns activity    Assist    Wheelchair 50 feet with 2 turns activity did not occur: Safety/medical concerns (fatigue, weakness, deconditioning, pain)       Wheelchair 150 feet activity     Assist  Wheelchair 150 feet activity did not occur: Safety/medical concerns (fatigue, weakness, deconditioning, pain)       Blood pressure (!) 85/55, pulse 70, temperature 97.6 F (36.4 C), temperature source Oral, resp. rate 18, height 4\' 11"  (1.499 m), weight 81.6 kg, SpO2 100 %.    Medical Problem List and Plan: 1.  Nondisplaced right hip greater trochanteric fracture as well as right nondisplaced fracture of the lateral humeral head secondary to fall.  Nonoperative.  Follow-up Dr. Earnestine Leys.  Weightbearing as tolerated.  Shoulder sling for comfort.  Continue CIR- PT, OT  2.  Antithrombotics: -DVT/anticoagulation:  Mechanical:  Antiembolism stockings, knee (TED hose) Bilateral lower extremities.               -antiplatelet therapy: N/A 3. Postoperative pain: Continue hydrocodone TID 4. Anxiety: Continue Xanax 0.5 mg nightly, Vistaril 25 mg twice daily as needed, trazodone as needed. Please place on Dr. Ferne Coe schedule             -antipsychotic agents: N/A 5. Neuropsych: This patient is capable of making decisions on her own behalf. 6. Skin/Wound Care: Routine skin checks 7. Fluids/Electrolytes/Nutrition: Routine in and outs 8.  End-stage renal disease on PD.  Continue peritoneal dialysis 9.  Anemia of chronic disease.    Hemoglobin 10.1 on 11/19  Continue to monitor 10.  Hypertension, but currently soft. D/c aldactone. Toprol-XL 25 mg daily, Lasix 80 mg daily.   11.  Diabetes mellitus with hyperglycemia.  Hemoglobin A1c 9.7. decrease NovoLog to 3 units 3 times daily,  Increase Levemir to 16 units twice daily.    CBG (last 3)  Recent Labs    10/15/21 0725 10/15/21 1114 10/15/21 1147  GLUCAP 304* 42* 86    11/23 still poor but improving control. Will adjust NPH to 20u qam and 35u qpm per diabetes coord recs.  11/24- Bgs slightly better- will wait to change til tomorrow, since just changed yesterday.   11/25- CBG's actually have dropped a little low- 60-186- will decrease Levemir to 18 units in AM and 34 units in evening.   11/26: d/c ISS coverage since hypoglycemic to 50. Keep checking CBGs AC/HS so we can adjust regimen accordingly.  11/27: CBG down to 27: glucose gel given with improvement to 40, another given with increase to 72, d/c Novolog with meals. If CBGs high tomorrow, d/c feeding supplement  11/29: Hypoglycemic to 51 yesterday evening: decrease daytime Levemir to 16U.   11/30: change insulin to NPH and add Novolog 3U after supper  12/1:  medications adjusted again today as per diabetes coordinator recs.  12.  Hyperlipidemia.  LDL 53 on 10/18, HDL 60,  VLDL 22, Lipitor, TG 111. Will discuss with son decreasing Lipitor dose given her fatigue, pain, and brain fog. Decrease Lipitor to 10mg . Recheck lipid panel in 3 months.  13.  COPD with tobacco abuse.  Continue nebulizers as directed.  Check oxygen saturations every shift.  Provide counsel guards to cessation of nicotine products. 14.  Obesity.  BMI 40.07  Dietary follow-up 15. Tachycardia: now bradycardic, decrease lopressor to 25mg  16. Hypomagnesemia: will be managed by nephrology while she is inpatient 17. Hyponatremia: likely secondary to volume overload as per nephrology. Improved to 127 on 11/19. Monitor with dialysis.   11/24- Na up slightly to 128 today- con't to monitor  11/28: Na stable at 128- continue to monitor with labs.  18.  Sleep disturbance: started Amitriptyline 10mg  HS. Schedule outpatient sleep study  Appears to be improving 19. Hypotension: maintain lasix given swelling,  continue to monitor BP TID, decrease lopressor to 12.5mg  20. Disposition: HFU scheduled with Dr. Ranell Patrick   LOS: 15 days A FACE TO FACE EVALUATION WAS PERFORMED  Martha Clan P [place] Jarquin 10/15/2021, 1:09 PM

## 2021-10-15 NOTE — Plan of Care (Signed)
  Problem: Consults Goal: RH GENERAL PATIENT EDUCATION Description: See Patient Education module for education specifics. Outcome: Progressing   Problem: RH BOWEL ELIMINATION Goal: RH STG MANAGE BOWEL WITH ASSISTANCE Description: STG Manage Bowel with mod I Assistance. Outcome: Progressing Goal: RH STG MANAGE BOWEL W/MEDICATION W/ASSISTANCE Description: STG Manage Bowel with Medication with mod I  Assistance. Outcome: Progressing   Problem: RH BLADDER ELIMINATION Goal: RH STG MANAGE BLADDER WITH ASSISTANCE Description: STG Manage Bladder Without Assistance Outcome: Progressing   Problem: RH SAFETY Goal: RH STG ADHERE TO SAFETY PRECAUTIONS W/ASSISTANCE/DEVICE Description: STG Adhere to Safety Precautions With cues Assistance/Device. Outcome: Progressing   Problem: RH PAIN MANAGEMENT Goal: RH STG PAIN MANAGED AT OR BELOW PT'S PAIN GOAL Description: At or below level 4 w prns Outcome: Progressing   Problem: RH KNOWLEDGE DEFICIT GENERAL Goal: RH STG INCREASE KNOWLEDGE OF SELF CARE AFTER HOSPITALIZATION Description: Patient and son(S) will be able to manage peritoneal dialysis, medications, dietary modifications using handouts and educational resources independently Outcome: Progressing

## 2021-10-15 NOTE — Progress Notes (Signed)
Occupational Therapy Session Note  Patient Details  Name: Andrea Stewart MRN: [id number] Date of Birth: 23-Oct-1946  Today's Date: 10/15/2021 OT Individual Time: 1030-1055 and [id number] OT Individual Time Calculation (min): 25 min and 54 min   Short Term Goals: Week 1:  OT Short Term Goal 1 (Week 1): Patient will use adaptive equipment for LB ADLs with mod A OT Short Term Goal 1 - Progress (Week 1): Met OT Short Term Goal 2 (Week 1): Patient will complete 1 step of toileting task OT Short Term Goal 2 - Progress (Week 1): Met OT Short Term Goal 3 (Week 1): Patient will maintain standing balance for 2 minutes in preparation for BADL Task OT Short Term Goal 3 - Progress (Week 1): Met Week 2:  OT Short Term Goal 1 (Week 2): STG = LTG due to ELOS   Skilled Therapeutic Interventions/Progress Updates:    Session 1: Pt greeted at time of session up in wheelchair agreeable to OT session, more alert and more clarity compared to previous sessions. Discussion with pt regarding assist from family at home, DC home and her comfort with this, reviewing education for RUE ROM and functional use and its importance for return to function. Politely declined ADL, maybe completing this afternoon if feeling up to it and did not need to toilet at this time. Offered K tape for RUE, declined. Retrograde massage performed at digits > hand > wrist > forearm with good result noted. Educated on elevation and keeping hand/UE moving. 1x10 grasp/squeeze and release, 1x10 towel slides for shoulder flexion/extension. Pt feeling slightly nauseous, provided emesis bag but did not have emesis episode. Up in chair alarm on call bell in reach.   Session 2: Pt greeted at time of session hand off from RT from group session, OT resume care. Pt transported to ADL apartment and practiced walk in shower transfers with posterior entry method with RW. Cues provided for stepping over, RW placement, and simulating placement of grab bar.  Discussion with pt regarding bathroom set up, DME, and grab bar placement. Discussion during rest breaks for diabetes education, healthier lifestyle choices, and managing sugars. Pt stating she felt "funny" at this time and needing to check blood sugar ASAP. Walk in shower transfer > out to wheelchair CGA and transported to [facility] with NT checking blood sugar, 87. NT provided pt with orange juice and crackers. Pt insisting on laying down 2/2 not feeling well, walked short distance to bed level with CGA and RW. Sit > supine Min/CGA for using each LE at a time. Scoot up in bed with Min A before consuming rest of OJ. Pt stating at this time she wanted to change clothes, performed UB/LB dressing sitting EOB with Min A for UB and Mod A for LB for pants to thread and pt able to pull over hips in standing. Sit > supine again with Min guard. Alarm on call bell in reach.    Therapy Documentation Precautions:  Precautions Precautions: Fall Precaution Comments: WBAT on RLE and RUE, RUE sling for comfort Required Braces or Orthoses: Sling (for comfort) Restrictions Weight Bearing Restrictions: No RUE Weight Bearing: Weight bearing as tolerated LUE Weight Bearing: Weight bearing as tolerated RLE Weight Bearing: Weight bearing as tolerated LLE Weight Bearing: Weight bearing as tolerated Other Position/Activity Restrictions: RUE sling for comfort     Therapy/Group: Individual Therapy  Viona Gilmore 10/15/2021, 7:21 AM

## 2021-10-15 NOTE — Progress Notes (Signed)
Physical Therapy Discharge Summary  Patient Details  Name: Andrea Stewart MRN: [id number] Date of Birth: [birth year]  Today's Date: 10/15/2021 PT Individual Time: 0900-0954 PT Individual Time Calculation (min): 54 min   Patient has met 5 of 9 long term goals due to improved activity tolerance, improved balance, improved postural control, increased strength, increased range of motion, ability to compensate for deficits, functional use of  right upper extremity, improved awareness, and improved coordination. Patient to discharge at a wheelchair level Supervision/Mod I. Patient's care partner is independent to provide the necessary physical and cognitive assistance at discharge. Pt's sons attended family education and verbalized and demonstrated confidence with all tasks to ensure safe discharge home.   Reasons goals not met: Pt did not meet gait goals of 25-65ft mod I as pt is currently ambulating 56ft with RW and supervision. Pt did not meet WC mobility goals of 75-133ft with supervision as pt is only able to propel ~20ft. Pt did not meet these goals due to self-limiting behaviors and demonstrates generalized weakness, decreased endurance, decreased balance, and increased RUE pain with mobility.   Recommendation:  Patient will benefit from ongoing skilled PT services in home health setting to continue to advance safe functional mobility, address ongoing impairments in transfers, generalized strengthening and ROM, dynamic standing balance/coordination, gait training, endurance, and to minimize fall risk.  Equipment: No equipment provided; pt already has RW and transport chair  Reasons for discharge: treatment goals met and discharge from hospital  Patient/family agrees with progress made and goals achieved: Yes  Today's Interventions: Received pt semi-reclined in bed, pt agreeable to PT treatment, and denied any pain at rest but reported increased RUE pain with mobility. Session with emphasis  on discharge planning, functional mobility, generalized strengthening, dynamic standing balance/coordination, simulated car transfers, and improved activity tolerance. Pt transferred semi-reclined<>sitting EOB and sitting EOB<>semi-reclined with supervision and use of bedrail with HOB elevated. Discussed getting bed rail assist for her and her husband upon discharge. Pt transferred bed<>WC stand<>pivot with RW mod I and performed WC mobility 73ft using BUE and supervision - stopped due to increased pain in RUE. Pt transported remainder of way to [facility] ortho gym and performed ambulatory simulated car transfer with RW and supervision with increased time. Pt with increased difficulty finding place to push up with and preferred to stand with BUE support on RW - educated pt on having person helping her in/out of car stabilize RW if she stands that way. In dayroom, pt performed seated BLE strengthening on Kinetron at 20 cm/sec for 1 minute x 4 trials with emphasis on glute/quad strengthening. Pt transported back to room in [facility] total A. Concluded session with pt sitting in WC, needs within reach, and chair pad alarm on.   PT Discharge Precautions/Restrictions Precautions Precautions: Fall Precaution Comments: WBAT on RLE and RUE, RUE sling for comfort Required Braces or Orthoses: Sling (for comfort) Restrictions Weight Bearing Restrictions: No RUE Weight Bearing: Weight bearing as tolerated RLE Weight Bearing: Weight bearing as tolerated Other Position/Activity Restrictions: RUE sling for comfort Pain Interference Pain Interference Pain Effect on Sleep: 4. Almost constantly Pain Interference with Therapy Activities: 1. Rarely or not at all Pain Interference with Day-to-Day Activities: 1. Rarely or not at all Cognition Overall Cognitive Status: Within Functional Limits for tasks assessed Arousal/Alertness: Awake/alert Orientation Level: Oriented X4 Memory: Impaired Awareness: Impaired Problem Solving:  Impaired Safety/Judgment: Appears intact Sensation Sensation Light Touch: Appears Intact Proprioception: Appears Intact Additional Comments: tingling in feet at night Coordination Gross  Motor Movements are Fluid and Coordinated: No Fine Motor Movements are Fluid and Coordinated: No Coordination and Movement Description: generalized weakness/deconditioning, decreased balance, and pain Finger Nose Finger Test: mild dysmetria on LUE, decreased on RUE but pt able to get finger to nose Heel Shin Test: decreased ROM RLE>LLE Motor  Motor Motor: Within Functional Limits Motor - Skilled Clinical Observations: generalized weakness/deconditioning, decreased balance, and pain  Mobility Bed Mobility Bed Mobility: Rolling Right;Rolling Left;Supine to Sit;Sit to Supine Rolling Right: Supervision/verbal cueing Rolling Left: Supervision/Verbal cueing Supine to Sit: Supervision/Verbal cueing Sit to Supine: Supervision/Verbal cueing Transfers Transfers: Sit to Stand;Stand to Sit;Stand Pivot Transfers Sit to Stand: Independent with assistive device Stand to Sit: Independent with assistive device Stand Pivot Transfers: Independent with assistive device Transfer (Assistive device): Rolling walker Locomotion  Gait Ambulation: Yes Gait Assistance: Supervision/Verbal cueing Gait Distance (Feet): 46 Feet Assistive device: Rolling walker Gait Assistance Details: Verbal cues for technique Gait Assistance Details: verbal cues for upright posture and to increse step length Gait Gait: Yes Gait Pattern: Impaired Gait Pattern: Step-to pattern;Decreased stride length;Decreased stance time - right;Trendelenburg;Decreased trunk rotation;Antalgic;Decreased step length - right;Decreased step length - left;Trunk flexed;Poor foot clearance - left;Poor foot clearance - right;Narrow base of support Gait velocity: decreased Stairs / Additional Locomotion Ramp: Contact Guard/touching assist (RW) Pick up small  object from the floor assist level: Dependent - Patient 0% Wheelchair Mobility Wheelchair Mobility: Yes Wheelchair Assistance: Chartered loss adjuster: Both upper extremities Wheelchair Parts Management: Needs assistance  Trunk/Postural Assessment  Cervical Assessment Cervical Assessment: Exceptions to [facility] (forward head) Thoracic Assessment Thoracic Assessment: Exceptions to [facility] (kyphosis) Lumbar Assessment Lumbar Assessment: Exceptions to [facility] (posterior pelvic tilt) Postural Control Postural Control: Within Functional Limits  Balance Balance Balance Assessed: Yes Static Sitting Balance Static Sitting - Balance Support: Feet supported;Bilateral upper extremity supported Static Sitting - Level of Assistance: 7: Independent Dynamic Sitting Balance Dynamic Sitting - Balance Support: Feet supported;No upper extremity supported Dynamic Sitting - Level of Assistance: 6: Modified independent (Device/Increase time) Static Standing Balance Static Standing - Balance Support: Bilateral upper extremity supported (RW) Static Standing - Level of Assistance: 6: Modified independent (Device/Increase time) Dynamic Standing Balance Dynamic Standing - Balance Support: Bilateral upper extremity supported (RW) Dynamic Standing - Level of Assistance: 6: Modified independent (Device/Increase time) Dynamic Standing - Comments: with transfers Extremity Assessment  RLE Assessment RLE Assessment: Exceptions to [facility] RLE Strength Right Hip Flexion: 4-/5 Right Hip ABduction: 4-/5 Right Hip ADduction: 4-/5 Right Knee Flexion: 4-/5 Right Knee Extension: 4-/5 Right Ankle Dorsiflexion: 4-/5 Right Ankle Plantar Flexion: 4-/5 LLE Assessment LLE Assessment: Exceptions to [facility] General Strength Comments: grossly generalized to 4/5  Alfonse Alpers PT, DPT  10/15/2021, 7:17 AM

## 2021-10-15 NOTE — Patient Care Conference (Signed)
Inpatient RehabilitationTeam Conference and Plan of Care Update Date: 10/14/2021   Time: 11:45 AM    Patient Name: Andrea Stewart      Medical Record Number: [id number]  Date of Birth: May 10, 1946 Sex: Female         Room/Bed: 5C21C/5C21C-01 Payor Info: Payor: [employer] ADVANTAGE / Plan: [employer] Must PPO / Product Type: *No Product type* /    Admit Date/Time:  09/30/2021  1:33 PM  Primary Diagnosis:  Closed right hip fracture [facility])  Hospital Problems: Principal Problem:   Closed right hip fracture (HCC) Active Problems:   Intertrochanteric fracture of right hip (HCC)   Acute pain of right shoulder   Sleep disturbance   Labile blood glucose   Anemia of chronic disease   Swelling    Expected Discharge Date: Expected Discharge Date: 10/16/21  Team Members Present: Physician leading conference: Dr. Leeroy Cha Social Worker Present: Erlene Quan, [place] Nurse Present: Dorien Chihuahua, RN PT Present: Estevan Ryder, PT OT Present: Other (comment) [facility] Alphonsa Gin, OT) [place] Coordinator present : Gunnar Fusi, SLP     Current Status/Progress Goal Weekly Team Focus  Bowel/Bladder     PD and oliguria; continent of bowel and bladder   No issues with continence   Toileting  Swallow/Nutrition/ Hydration             ADL's   Min A bathing, Min A UB dressing, Min-mod A LB dressing, CGA toileting, CGA with RW for functional transfers  Supervision-CGA  AAROM/AROM of RUE, activity tolerance, ADL retraining   Mobility   supine<>sitting EOB with HOB elevated and use of bedrails close supervision, transfers with RW CGA, gait 56ft with RW CGA - very self-limiting  supervision/mod I  functional mobility/transfers, generalized strengthening, dynamic standing balance/coordination, gait training, endurance, and D/C planning   Communication             Safety/Cognition/ Behavioral Observations            Pain     Chronic low back pain addressed with scheduled and prn meds    Pain managed with prn medications   Monitor need for scheduled and prn medication and effectiveness of medications  Skin     N/A          Discharge Planning:  No insurance for SNF. D/c home with son to provide supervision and assistance for pt spouse. 3x a week currently, still working out supervision with other family memebers. Pt will need to be able to do as much independently as posssible   Team Discussion: MD adjusting pain medications.  Patient on target to meet rehab goals: yes, patient can be self limiting regarding progression. Currently min assist for upper body care and min - mod assist for lower body care. Needs CGA for toileing. Able to transfer with CGA and ambulate up to 75'. Goals for discharge set for supervision level overall.  *See Care Plan and progress notes for long and short-term goals.   Revisions to Treatment Plan:  Downgraded goals   Teaching Needs: Safety, medication management, insulin administration (son), secondary risk management, etc   Current Barriers to Discharge: Decreased caregiver support and PD  Possible Resolutions to Barriers: Family education with son     Medical Summary Current Status: extremely labile CBGs- have ranged from 42 to 500s, anxiety, pain from multiple fractures, renal failure, BMI 37.58  Barriers to Discharge: Medical stability  Barriers to Discharge Comments: extremely labile CBGs- have ranged from 27 to 500s, anxiety, pain from multiple fractures,  renal failure, BMI 37.58 Possible Resolutions to [employer] Focus: daily CBG management/communication with diabetic coordinator for recommendations, continue Xanax/counseling, continue Norco for pain management   Continued Need for Acute Rehabilitation Level of Care: The patient requires daily medical management by a physician with specialized training in physical medicine and rehabilitation for the following reasons: Direction of a multidisciplinary physical rehabilitation  program to maximize functional independence : Yes Medical management of patient stability for increased activity during participation in an intensive rehabilitation regime.: Yes Analysis of laboratory values and/or radiology reports with any subsequent need for medication adjustment and/or medical intervention. : Yes   I attest that I was present, lead the team conference, and concur with the assessment and plan of the team.   Dorien Chihuahua B 10/15/2021, 8:52 AM

## 2021-10-15 NOTE — Progress Notes (Addendum)
Inpatient Rehabilitation Discharge Medication Review by a Pharmacist  A complete drug regimen review was completed for this patient to identify any potential clinically significant medication issues.  High Risk Drug Classes Is patient taking? Indication by Medication  Antipsychotic yes Hydroxyzine/xanax - anxiety  Anticoagulant Yes Heparin only for fibrin in PD bags.  Antibiotic No   Opioid Yes hydrocodone for pain s/p hip fx.  Antiplatelet Yes  ASA - PAD  Hypoglycemics/insulin Yes NPH for DM  Vasoactive Medication Yes Toprol for HTN  Chemotherapy No   Other yes Protonix - reflux, lasix ERSD, lipitor hyperlipidemia     Type of Medication Issue Identified Description of Issue Recommendation(s)  Drug Interaction(s) (clinically significant)     Duplicate Therapy     Allergy     No Medication Administration End Date     Incorrect Dose     Additional Drug Therapy Needed     Significant med changes from prior encounter (inform family/care partners about these prior to discharge).    Other       Clinically significant medication issues were identified that warrant physician communication and completion of prescribed/recommended actions by midnight of the next day:  No  Time spent performing this drug regimen review (minutes):  15-52min

## 2021-10-15 NOTE — Progress Notes (Signed)
Inpatient Diabetes Program Recommendations  AACE/ADA: New Consensus Statement on Inpatient Glycemic Control (2015)  Target Ranges:  Prepandial:   less than 140 mg/dL      Peak postprandial:   less than 180 mg/dL (1-2 hours)      Critically ill patients:  140 - 180 mg/dL   Lab Results  Component Value Date   GLUCAP 86 10/15/2021   HGBA1C 9.7 (H) 09/21/2021    Review of Glycemic Control  Latest Reference Range & Units 10/14/21 08:53 10/14/21 11:36 10/14/21 16:32 10/14/21 21:25 10/15/21 07:25 10/15/21 11:14 10/15/21 11:47  Glucose-Capillary 70 - 99 mg/dL 313 (H) 237 (H) 145 (H) 403 (H) 304 (H) 42 (LL) 86  Diabetes history: DM 2 Outpatient Diabetes medications:  NPH 20 units in the AM and NPH 35 units in the evening Current orders for Inpatient glycemic control:  NPH 16 units in the AM and NPH 34 units in the evening Novolog 3 units with supper (1700) Inpatient Diabetes Program Recommendations:    Note that CBG's increase at night during PD (7p-7a).  Therefore patient needs much less insulin during the day then she does at night.   May consider adding Novolog "very Sensitive" correction at 2000, midnight, and 0400 to cover elevations while on PD.  Consider reducing night time NPH since correction added.  May need reduction in AM dose of NPH as well? Will monitor.   Thanks,  Adah Perl, RN, BC-ADM Inpatient Diabetes Coordinator Pager [phone]  (8a-5p)

## 2021-10-15 NOTE — Progress Notes (Signed)
Hypoglycemic Event  CBG: 42   Treatment: 2 tubes glucose gel  Symptoms: Sweaty and Shaky  Follow-up CBG: Time:1130 CBG Result:86   Possible Reasons for Event: Inadequate meal intake medical regimen   Comments/MD notified:Dr. Samuel Jester

## 2021-10-16 DIAGNOSIS — N2581 Secondary hyperparathyroidism of renal origin: Secondary | ICD-10-CM | POA: Diagnosis not present

## 2021-10-16 DIAGNOSIS — Z992 Dependence on renal dialysis: Secondary | ICD-10-CM | POA: Diagnosis not present

## 2021-10-16 DIAGNOSIS — D631 Anemia in chronic kidney disease: Secondary | ICD-10-CM | POA: Diagnosis not present

## 2021-10-16 DIAGNOSIS — D509 Iron deficiency anemia, unspecified: Secondary | ICD-10-CM | POA: Diagnosis not present

## 2021-10-16 DIAGNOSIS — I120 Hypertensive chronic kidney disease with stage 5 chronic kidney disease or end stage renal disease: Secondary | ICD-10-CM | POA: Diagnosis not present

## 2021-10-16 DIAGNOSIS — E875 Hyperkalemia: Secondary | ICD-10-CM | POA: Diagnosis not present

## 2021-10-16 DIAGNOSIS — N186 End stage renal disease: Secondary | ICD-10-CM | POA: Diagnosis not present

## 2021-10-16 DIAGNOSIS — S72001D Fracture of unspecified part of neck of right femur, subsequent encounter for closed fracture with routine healing: Secondary | ICD-10-CM | POA: Diagnosis not present

## 2021-10-16 DIAGNOSIS — R82998 Other abnormal findings in urine: Secondary | ICD-10-CM | POA: Diagnosis not present

## 2021-10-16 DIAGNOSIS — E1129 Type 2 diabetes mellitus with other diabetic kidney complication: Secondary | ICD-10-CM | POA: Diagnosis not present

## 2021-10-16 DIAGNOSIS — N2589 Other disorders resulting from impaired renal tubular function: Secondary | ICD-10-CM | POA: Diagnosis not present

## 2021-10-16 DIAGNOSIS — S72001A Fracture of unspecified part of neck of right femur, initial encounter for closed fracture: Secondary | ICD-10-CM | POA: Diagnosis not present

## 2021-10-16 LAB — GLUCOSE, CAPILLARY
Glucose-Capillary: 100 mg/dL — ABNORMAL HIGH (ref 70–99)
Glucose-Capillary: 102 mg/dL — ABNORMAL HIGH (ref 70–99)
Glucose-Capillary: 104 mg/dL — ABNORMAL HIGH (ref 70–99)
Glucose-Capillary: 277 mg/dL — ABNORMAL HIGH (ref 70–99)
Glucose-Capillary: 437 mg/dL — ABNORMAL HIGH (ref 70–99)
Glucose-Capillary: 60 mg/dL — ABNORMAL LOW (ref 70–99)
Glucose-Capillary: 61 mg/dL — ABNORMAL LOW (ref 70–99)
Glucose-Capillary: 66 mg/dL — ABNORMAL LOW (ref 70–99)
Glucose-Capillary: 67 mg/dL — ABNORMAL LOW (ref 70–99)

## 2021-10-16 LAB — BASIC METABOLIC PANEL
Anion gap: 4 — ABNORMAL LOW (ref 5–15)
BUN: 28 mg/dL — ABNORMAL HIGH (ref 8–23)
CO2: 30 mmol/L (ref 22–32)
Calcium: 8.4 mg/dL — ABNORMAL LOW (ref 8.9–10.3)
Chloride: 97 mmol/L — ABNORMAL LOW (ref 98–111)
Creatinine, Ser: 5.96 mg/dL — ABNORMAL HIGH (ref 0.44–1.00)
GFR, Estimated: 7 mL/min — ABNORMAL LOW (ref >60)
Glucose, Bld: 205 mg/dL — ABNORMAL HIGH (ref 70–99)
Potassium: 4.8 mmol/L (ref 3.5–5.1)
Sodium: 131 mmol/L — ABNORMAL LOW (ref 135–145)

## 2021-10-16 MED ORDER — POTASSIUM CHLORIDE 20 MEQ PO PACK: 40.0000 meq | PACK | Freq: Every day | ORAL | 0 refills | Status: DC

## 2021-10-16 MED ORDER — ATORVASTATIN CALCIUM 10 MG PO TABS: 30.0000 mg | ORAL_TABLET | Freq: Every day | ORAL | 0 refills | Status: DC

## 2021-10-16 MED ORDER — GENTAMICIN SULFATE 0.1 % EX CREA: 1.0000 "application " | TOPICAL_CREAM | Freq: Every day | CUTANEOUS | 0 refills | Status: AC

## 2021-10-16 MED ORDER — VITAMIN D (ERGOCALCIFEROL) 1.25 MG (50000 UNIT) PO CAPS: 50000.0000 [IU] | ORAL_CAPSULE | ORAL | 0 refills | Status: DC

## 2021-10-16 MED ORDER — POLYETHYLENE GLYCOL 3350 17 G PO PACK: 17.0000 g | PACK | Freq: Every day | ORAL | 0 refills | Status: AC | PRN

## 2021-10-16 MED ORDER — SENNA 8.6 MG PO CAPS: 1.0000 | ORAL_CAPSULE | Freq: Every evening | ORAL | 0 refills | Status: AC | PRN

## 2021-10-16 MED ORDER — INSULIN NPH (HUMAN) (ISOPHANE) 100 UNIT/ML ~~LOC~~ SUSP: SUBCUTANEOUS | 11 refills | Status: AC

## 2021-10-16 MED ORDER — MAGNESIUM OXIDE -MG SUPPLEMENT 400 (240 MG) MG PO TABS: 800.0000 mg | ORAL_TABLET | Freq: Two times a day (BID) | ORAL | 0 refills | Status: DC

## 2021-10-16 MED ORDER — AMITRIPTYLINE HCL 10 MG PO TABS: 10.0000 mg | ORAL_TABLET | Freq: Every day | ORAL | 0 refills | Status: DC

## 2021-10-16 MED ORDER — MELATONIN 3 MG PO TABS: 3.0000 mg | ORAL_TABLET | Freq: Every day | ORAL | 0 refills | Status: DC

## 2021-10-16 MED ORDER — METOPROLOL SUCCINATE ER 25 MG PO TB24: 12.5000 mg | ORAL_TABLET | Freq: Every day | ORAL | 0 refills | Status: DC

## 2021-10-16 MED ORDER — ALPRAZOLAM 1 MG PO TABS: 0.5000 mg | ORAL_TABLET | Freq: Every day | ORAL | Status: AC

## 2021-10-16 MED ORDER — HYDROCODONE-ACETAMINOPHEN 5-325 MG PO TABS: 1.0000 | ORAL_TABLET | Freq: Four times a day (QID) | ORAL | 0 refills | Status: DC | PRN

## 2021-10-16 NOTE — Progress Notes (Signed)
Date and time results received: 10/16/21 (use smartphrase ".now" to insert current time)  Test: Blood sugar Critical Value: 437  Name of Provider Notified:Love, Pam  Orders Received? Or Actions Taken?: Pt received coverage of Novolog as ordered. Pa ordered BMP to confirm glucose level.

## 2021-10-16 NOTE — Progress Notes (Signed)
INPATIENT REHABILITATION DISCHARGE NOTE   Discharge instructions by: Jeannene Patella Pa  Verbalized understanding: yes  Skin care/Wound care healing? Skin intact   Pain: pain less than 3   IV's: removed   Tubes/Drains: none  O2: room air   Safety instructions: given to family   Patient belongings: sent with family  Discharged HM:CNOB  Discharged via: wheelchair   Notes:

## 2021-10-16 NOTE — Progress Notes (Signed)
PROGRESS NOTE   Subjective/Complaints: Felt her CBGs were low this morning so she notified Josh who gave her graham crackers and juice, she feels better now- CBG was 102- transitioning to home sliding scale on d/c  ROS:   Pt denies SOB, abd pain, CP, N/V/C/D, and vision changes, +right shoulder pain, +malaise   Objective:   No results found. No results for input(s): WBC, HGB, HCT, PLT in the last 72 hours.   Recent Labs    10/16/21 0536  NA 131*  K 4.8  CL 97*  CO2 30  GLUCOSE 205*  BUN 28*  CREATININE 5.96*  CALCIUM 8.4*       Intake/Output Summary (Last 24 hours) at 10/16/2021 0907 Last data filed at 10/16/2021 0754 Gross per 24 hour  Intake 10454 ml  Output 11552 ml  Net -1098 ml        Physical Exam: Vital Signs Blood pressure (!) 100/52, pulse 64, temperature 98.7 F (37.1 C), resp. rate 14, height 4\' 11"  (1.499 m), weight 80.7 kg, SpO2 98 %. General: alert and talkative this morning. Sitting at edge of bed, joking.  HENT: conjugate gaze; oropharynx moist CV: Bradycardic, hypotensive Pulmonary: CTA B/L; no W/R/R- good air movement GI: soft, NT, ND, (+)BS- PD catheter in place.  Psychiatric: appropriate; slightly more interactive, anxious Neurological: vague, but appropriate .  Ext: no clubbing, cyanosis, RUE trace distal edema Psych: pleasant and cooperative  Skin: dry Musc: RUE edema. Proximal RUE/RLE tenderness. Sling in place. Ambulating well in hallway.  Neuro: Alert RUE/RLE: still somewhat limited due to pain, stable > proximally  Assessment/Plan: 1. Functional deficits which require 3+ hours per day of interdisciplinary therapy in a comprehensive inpatient rehab setting. Physiatrist is providing close team supervision and 24 hour management of active medical problems listed below. Physiatrist and rehab team continue to assess barriers to discharge/monitor patient progress toward  functional and medical goals  Care Tool:  Bathing    Body parts bathed by patient: Right arm, Chest, Abdomen, Front perineal area, Buttocks, Right upper leg, Left upper leg, Face, Right lower leg, Left lower leg, Left arm   Body parts bathed by helper: Left arm, Right lower leg, Left lower leg     Bathing assist Assist Level: Contact Guard/Touching assist     Upper Body Dressing/Undressing Upper body dressing   What is the patient wearing?: Pull over shirt    Upper body assist Assist Level: Supervision/Verbal cueing    Lower Body Dressing/Undressing Lower body dressing      What is the patient wearing?: Pants     Lower body assist Assist for lower body dressing: Contact Guard/Touching assist     Toileting Toileting    Toileting assist Assist for toileting: Supervision/Verbal cueing     Transfers Chair/bed transfer  Transfers assist     Chair/bed transfer assist level: Independent with assistive device Chair/bed transfer assistive device: Museum/gallery exhibitions officer assist      Assist level: Supervision/Verbal cueing Assistive device: Walker-rolling Max distance: 43ft   Walk 10 feet activity   Assist     Assist level: Supervision/Verbal cueing Assistive device: Walker-rolling   Walk 50 feet  activity   Assist Walk 50 feet with 2 turns activity did not occur: Safety/medical concerns  Assist level: Contact Guard/Touching assist Assistive device: Walker-rolling    Walk 150 feet activity   Assist Walk 150 feet activity did not occur: Safety/medical concerns (fatigue, weakness, deconditioning)         Walk 10 feet on uneven surface  activity   Assist Walk 10 feet on uneven surfaces activity did not occur: Safety/medical concerns (pain, fatigue, weakness, decreased balance)   Assist level: Contact Guard/Touching assist Assistive device: Walker-rolling   Wheelchair     Assist Is the patient using a wheelchair?:  Yes Type of Wheelchair: Manual Wheelchair activity did not occur: Safety/medical concerns (pain, fatigue, weakness, decreased balance)  Wheelchair assist level: Supervision/Verbal cueing Max wheelchair distance: 65ft    Wheelchair 50 feet with 2 turns activity    Assist    Wheelchair 50 feet with 2 turns activity did not occur: Safety/medical concerns (fatigue, weakness, deconditioning, pain)       Wheelchair 150 feet activity     Assist  Wheelchair 150 feet activity did not occur: Safety/medical concerns (fatigue, weakness, deconditioning, pain)       Blood pressure (!) 100/52, pulse 64, temperature 98.7 F (37.1 C), resp. rate 14, height 4\' 11"  (1.499 m), weight 80.7 kg, SpO2 98 %.    Medical Problem List and Plan: 1.  Nondisplaced right hip greater trochanteric fracture as well as right nondisplaced fracture of the lateral humeral head secondary to fall.  Nonoperative.  Follow-up Dr. Earnestine Leys.  Weightbearing as tolerated.  Shoulder sling for comfort.  D/c home.  2.  Antithrombotics: -DVT/anticoagulation:  Mechanical:  Antiembolism stockings, knee (TED hose) Bilateral lower extremities.               -antiplatelet therapy: N/A 3. Postoperative pain: Continue hydrocodone TID 4. Anxiety: Continue Xanax 0.5 mg nightly, Vistaril 25 mg twice daily as needed, trazodone as needed. May benefit from seeing Dr. Sima Matas outpatient.              -antipsychotic agents: N/A 5. Neuropsych: This patient is capable of making decisions on her own behalf. 6. Skin/Wound Care: Routine skin checks 7. Fluids/Electrolytes/Nutrition: Routine in and outs 8.  End-stage renal disease on PD.  Continue peritoneal dialysis 9.  Anemia of chronic disease.    Hemoglobin 10.1 on 11/19  Continue to monitor 10.  Hypertension, but currently soft. D/c aldactone. Toprol-XL 25 mg daily, Lasix 80 mg daily.   11.  Diabetes mellitus with hyperglycemia.  Hemoglobin A1c 9.7. decrease NovoLog to 3  units 3 times daily, Increase Levemir to 16 units twice daily.    CBG (last 3)  Recent Labs    10/16/21 0358 10/16/21 0814 10/16/21 0837  GLUCAP 277* 104* 102*    11/23 still poor but improving control. Will adjust NPH to 20u qam and 35u qpm per diabetes coord recs.  11/24- Bgs slightly better- will wait to change til tomorrow, since just changed yesterday.   11/25- CBG's actually have dropped a little low- 60-186- will decrease Levemir to 18 units in AM and 34 units in evening.   11/26: d/c ISS coverage since hypoglycemic to 50. Keep checking CBGs AC/HS so we can adjust regimen accordingly.  11/27: CBG down to 27: glucose gel given with improvement to 40, another given with increase to 72, d/c Novolog with meals. If CBGs high tomorrow, d/c feeding supplement  11/29: Hypoglycemic to 51 yesterday evening: decrease daytime Levemir to 16U.  11/30: change insulin to NPH and add Novolog 3U after supper  12/1: medications adjusted again today as per diabetes coordinator recs.   12/2: transition back to home sliding scale on d/c 12.  Hyperlipidemia.  LDL 53 on 10/18, HDL 60,  VLDL 22, Lipitor, TG 111. Will discuss with son decreasing Lipitor dose given her fatigue, pain, and brain fog. Decrease Lipitor to 10mg . Recheck lipid panel in 3 months.  13.  COPD with tobacco abuse.  Continue nebulizers as directed.  Check oxygen saturations every shift.  Provide counsel guards to cessation of nicotine products. 14.  Obesity.  BMI 40.07  Dietary follow-up 15. Tachycardia: now bradycardic, decrease lopressor to 25mg . Maintain this lower dose (home dose) as still hypotensive 16. Hypomagnesemia: will be managed by nephrology while she is inpatient 17. Hyponatremia: likely secondary to volume overload as per nephrology. Improved to 127 on 11/19. Monitor with dialysis.   11/24- Na up slightly to 128 today- con't to monitor  11/28: Na stable at 128- continue to monitor with labs.  18.  Sleep disturbance: started  Amitriptyline 10mg  HS. Schedule outpatient sleep study  Appears to be improving 19. Hypotension: maintain lasix given swelling, continue to monitor BP TID, decrease lopressor to 12.5mg  20. Disposition: HFU scheduled with Dr. Ranell Patrick    >30 minutes spent in discharge of patient including review of medications and follow-up appointments, physical examination, and in answering all patient's questions   LOS: 16 days A FACE TO FACE EVALUATION WAS Overly 10/16/2021, 9:07 AM

## 2021-10-16 NOTE — Progress Notes (Signed)
Inpatient Rehabilitation Care Coordinator Discharge Note   Patient Details  Name: Andrea Stewart MRN: [id number] Date of Birth: May 27, 1946   Discharge location: Home  Length of Stay: 16 Days  Discharge activity level: [place]  Home/community participation: Pt sons  Patient response SZ:JUDILO Literacy - How often do you need to have someone help you when you read instructions, pamphlets, or other written material from your doctor or pharmacy?: Often  Patient response KP:WXGKMK Isolation - How often do you feel lonely or isolated from those around you?: Rarely  Services provided included: SW, Pharmacy, TR, RN, SLP, OT, PT, RD, MD  Financial Services:  Financial Services Utilized: Multimedia programmer [employer]  Choices offered to/list presented to: pt and son  Follow-up services arranged:  [place]: [facility]         Patient response to transportation need: Is the patient able to respond to transportation needs?: Yes In the past 12 months, has lack of transportation kept you from medical appointments or from getting medications?: No In the past 12 months, has lack of transportation kept you from meetings, work, or from getting things needed for daily living?: No    Comments (or additional information):  Patient/Family verbalized understanding of follow-up arrangements:  Yes  Individual responsible for coordination of the follow-up plan: Sean  Confirmed correct DME delivered: Dyanne Iha 10/16/2021    Dyanne Iha

## 2021-10-16 NOTE — Progress Notes (Signed)
Patient ID: Andrea Stewart, female   DOB: 12-Mar-1946, 75 y.o.   MRN: [id number]  Pt family now reporting DME needed. DME ordered through [employer].  [place], [place]

## 2021-10-16 NOTE — Progress Notes (Signed)
[facility] NEPHROLOGY PROGRESS NOTE  Assessment/ Plan:  Dialysis Orders: Center: [facility]  on [facility] . EDW 73 kg, 5 exchanges, 2 liters fill, fill time 10 min, dwell time 1.5 hrs, drain time 20 minutes.   # Status post right hip and shoulder fractures: She continues to make improvements with non-surgical management in the [facility] inpatient rehabilitation with ongoing PT/OT.  Likely plan for discharge today.  #. ESRD: Continue current prescription of peritoneal dialysis alternating 4.25% and 2.5% dianeal solution for volume overload-she continues to feel better with ongoing ultrafiltration. D/w PD nurse.  Net UF around 1.3 L.  Continue current PD prescription.    #. Anemia: Hemoglobin at goal, continue ESA and trend hemoglobin/hematocrit.    # CKD-MBD: Continue renal diet with ongoing lab monitoring-not on phosphorus binder.  # Nutrition: Continue renal diet/renal multivitamin and oral nutritional supplementation.  # Hypertension: Blood pressure trend improving with ultrafiltration.  Also on Lasix.  #Hyponatremia, hyper volume: Continue UF with PD and fluid restriction.  Monitor lab.  Subjective: Seen and examined.  Denies nausea, vomiting, chest pain, shortness of breath.  She is excited about going home today.   Objective Vital signs in last 24 hours: Vitals:   10/16/21 0329 10/16/21 0500 10/16/21 0630 10/16/21 0745  BP: (!) 100/52     Pulse: 64     Resp: 14     Temp: 98.7 F (37.1 C)     TempSrc:      SpO2: 97%   98%  Weight:  82.6 kg 80.7 kg   Height:       Weight change: -0.8 kg  Intake/Output Summary (Last 24 hours) at 10/16/2021 1023 Last data filed at 10/16/2021 0754 Gross per 24 hour  Intake 10454 ml  Output 11552 ml  Net -1098 ml        Labs: Basic Metabolic Panel: Recent Labs  Lab 10/12/21 0605 10/16/21 0536  NA 128* 131*  K 5.0 4.8  CL 93* 97*  CO2 30 30  GLUCOSE 284* 205*  BUN 25* 28*  CREATININE 5.52* 5.96*  CALCIUM  8.2* 8.4*    Liver Function Tests: No results for input(s): AST, ALT, ALKPHOS, BILITOT, PROT, ALBUMIN in the last 168 hours.  No results for input(s): LIPASE, AMYLASE in the last 168 hours. No results for input(s): AMMONIA in the last 168 hours. CBC: Recent Labs  Lab 10/12/21 0605  WBC 5.9  HGB 11.1*  HCT 35.3*  MCV 97.2  PLT 268    Cardiac Enzymes: No results for input(s): CKTOTAL, CKMB, CKMBINDEX, TROPONINI in the last 168 hours. CBG: Recent Labs  Lab 10/15/21 2204 10/16/21 0007 10/16/21 0358 10/16/21 0814 10/16/21 0837  GLUCAP 423* 437* 277* 104* 102*     Iron Studies: No results for input(s): IRON, TIBC, TRANSFERRIN, FERRITIN in the last 72 hours. Studies/Results: No results found.  Medications: Infusions:  dialysis solution 2.5% low-MG/low-CA     dialysis solution 4.25% low-MG/low-CA      Scheduled Medications:  ALPRAZolam  0.5 mg Oral QHS   amitriptyline  10 mg Oral QHS   aspirin EC  81 mg Oral BID   atorvastatin  10 mg Oral Q supper   darbepoetin (ARANESP) injection - NON-DIALYSIS  25 mcg Subcutaneous Q Thu-1800   furosemide  80 mg Oral Daily   gentamicin cream  1 application Topical Daily   HYDROcodone-acetaminophen  1 tablet Oral TID   insulin aspart  0-6 Units Subcutaneous TID   insulin aspart  3 Units  Subcutaneous Q supper   insulin NPH Human  16 Units Subcutaneous QAC breakfast   insulin NPH Human  28 Units Subcutaneous QHS   magnesium oxide  800 mg Oral BID   melatonin  3 mg Oral QHS   metoprolol succinate  12.5 mg Oral Q breakfast   mometasone-formoterol  2 puff Inhalation BID   multivitamin with minerals  1 tablet Oral Daily   pantoprazole  40 mg Oral BID   potassium chloride  40 mEq Oral Daily   Vitamin D (Ergocalciferol)  50,000 Units Oral Weekly    have reviewed scheduled and prn medications.  Physical Exam: General: Lying on bed comfortable, not in distress. Heart:RRR, s1s2 nl Lungs: Clear b/l, no crackle Abdomen:soft,  Non-tender, non-distended Extremities: Some pitting edema present in lower extremities, stable Dialysis Access: PD catheter in place.  Caralina Nop Prasad Amilia Vandenbrink 10/16/2021,10:23 AM  LOS: 16 days

## 2021-10-16 NOTE — Progress Notes (Signed)
Inpatient Diabetes Program Recommendations  AACE/ADA: New Consensus Statement on Inpatient Glycemic Control (2015)  Target Ranges:  Prepandial:   less than 140 mg/dL      Peak postprandial:   less than 180 mg/dL (1-2 hours)      Critically ill patients:  140 - 180 mg/dL   Lab Results  Component Value Date   GLUCAP 102 (H) 10/16/2021   HGBA1C 9.7 (H) 09/21/2021    Review of Glycemic Control  Latest Reference Range & Units 10/15/21 16:43 10/15/21 19:42 10/15/21 22:04 10/16/21 00:07 10/16/21 03:58 10/16/21 08:14 10/16/21 08:37  Glucose-Capillary 70 - 99 mg/dL 142 (H) 277 (H) 423 (H) 437 (H) 277 (H) 104 (H) 102 (H)  Diabetes history: DM 2 Outpatient Diabetes medications:  NPH 20 units in the AM and NPH 35 units in the evening Current orders for Inpatient glycemic control:  NPH 16 units in the AM and NPH 28 units in the evening Novolog 3 units with supper (1700) Novolog 0-6 units 2000, 2400, and 0400 Inpatient Diabetes Program Recommendations:    Please consider reducing NPH to 12 units q AM.   Thanks,  Adah Perl, RN, BC-ADM Inpatient Diabetes Coordinator Pager [phone]  (8a-5p)

## 2021-10-19 DIAGNOSIS — S72141A Displaced intertrochanteric fracture of right femur, initial encounter for closed fracture: Secondary | ICD-10-CM | POA: Diagnosis not present

## 2021-10-20 DIAGNOSIS — Z96653 Presence of artificial knee joint, bilateral: Secondary | ICD-10-CM | POA: Diagnosis not present

## 2021-10-20 DIAGNOSIS — S72141D Displaced intertrochanteric fracture of right femur, subsequent encounter for closed fracture with routine healing: Secondary | ICD-10-CM | POA: Diagnosis not present

## 2021-10-20 DIAGNOSIS — M810 Age-related osteoporosis without current pathological fracture: Secondary | ICD-10-CM | POA: Diagnosis not present

## 2021-10-20 DIAGNOSIS — E1142 Type 2 diabetes mellitus with diabetic polyneuropathy: Secondary | ICD-10-CM | POA: Diagnosis not present

## 2021-10-20 DIAGNOSIS — M797 Fibromyalgia: Secondary | ICD-10-CM | POA: Diagnosis not present

## 2021-10-20 DIAGNOSIS — M5416 Radiculopathy, lumbar region: Secondary | ICD-10-CM | POA: Diagnosis not present

## 2021-10-20 DIAGNOSIS — I120 Hypertensive chronic kidney disease with stage 5 chronic kidney disease or end stage renal disease: Secondary | ICD-10-CM | POA: Diagnosis not present

## 2021-10-20 DIAGNOSIS — I70219 Atherosclerosis of native arteries of extremities with intermittent claudication, unspecified extremity: Secondary | ICD-10-CM | POA: Diagnosis not present

## 2021-10-20 DIAGNOSIS — N186 End stage renal disease: Secondary | ICD-10-CM | POA: Diagnosis not present

## 2021-10-20 DIAGNOSIS — M5136 Other intervertebral disc degeneration, lumbar region: Secondary | ICD-10-CM | POA: Diagnosis not present

## 2021-10-20 DIAGNOSIS — E7800 Pure hypercholesterolemia, unspecified: Secondary | ICD-10-CM | POA: Diagnosis not present

## 2021-10-20 DIAGNOSIS — M722 Plantar fascial fibromatosis: Secondary | ICD-10-CM | POA: Diagnosis not present

## 2021-10-20 DIAGNOSIS — Z992 Dependence on renal dialysis: Secondary | ICD-10-CM | POA: Diagnosis not present

## 2021-10-20 DIAGNOSIS — E1122 Type 2 diabetes mellitus with diabetic chronic kidney disease: Secondary | ICD-10-CM | POA: Diagnosis not present

## 2021-10-20 DIAGNOSIS — G4700 Insomnia, unspecified: Secondary | ICD-10-CM | POA: Diagnosis not present

## 2021-10-20 DIAGNOSIS — F32A Depression, unspecified: Secondary | ICD-10-CM | POA: Diagnosis not present

## 2021-10-20 DIAGNOSIS — B20 Human immunodeficiency virus [HIV] disease: Secondary | ICD-10-CM | POA: Diagnosis not present

## 2021-10-20 DIAGNOSIS — E1151 Type 2 diabetes mellitus with diabetic peripheral angiopathy without gangrene: Secondary | ICD-10-CM | POA: Diagnosis not present

## 2021-10-20 DIAGNOSIS — K589 Irritable bowel syndrome without diarrhea: Secondary | ICD-10-CM | POA: Diagnosis not present

## 2021-10-20 DIAGNOSIS — E079 Disorder of thyroid, unspecified: Secondary | ICD-10-CM | POA: Diagnosis not present

## 2021-10-20 DIAGNOSIS — S72002D Fracture of unspecified part of neck of left femur, subsequent encounter for closed fracture with routine healing: Secondary | ICD-10-CM | POA: Diagnosis not present

## 2021-10-20 DIAGNOSIS — K439 Ventral hernia without obstruction or gangrene: Secondary | ICD-10-CM | POA: Diagnosis not present

## 2021-10-20 DIAGNOSIS — I2510 Atherosclerotic heart disease of native coronary artery without angina pectoris: Secondary | ICD-10-CM | POA: Diagnosis not present

## 2021-10-20 DIAGNOSIS — J449 Chronic obstructive pulmonary disease, unspecified: Secondary | ICD-10-CM | POA: Diagnosis not present

## 2021-10-20 DIAGNOSIS — F17210 Nicotine dependence, cigarettes, uncomplicated: Secondary | ICD-10-CM | POA: Diagnosis not present

## 2021-10-26 DIAGNOSIS — E1165 Type 2 diabetes mellitus with hyperglycemia: Secondary | ICD-10-CM | POA: Diagnosis not present

## 2021-10-28 DIAGNOSIS — K439 Ventral hernia without obstruction or gangrene: Secondary | ICD-10-CM | POA: Diagnosis not present

## 2021-10-28 DIAGNOSIS — G4700 Insomnia, unspecified: Secondary | ICD-10-CM | POA: Diagnosis not present

## 2021-10-28 DIAGNOSIS — K589 Irritable bowel syndrome without diarrhea: Secondary | ICD-10-CM | POA: Diagnosis not present

## 2021-10-28 DIAGNOSIS — E079 Disorder of thyroid, unspecified: Secondary | ICD-10-CM | POA: Diagnosis not present

## 2021-10-28 DIAGNOSIS — S42294D Other nondisplaced fracture of upper end of right humerus, subsequent encounter for fracture with routine healing: Secondary | ICD-10-CM | POA: Diagnosis not present

## 2021-10-28 DIAGNOSIS — E1142 Type 2 diabetes mellitus with diabetic polyneuropathy: Secondary | ICD-10-CM | POA: Diagnosis not present

## 2021-10-28 DIAGNOSIS — M5416 Radiculopathy, lumbar region: Secondary | ICD-10-CM | POA: Diagnosis not present

## 2021-10-28 DIAGNOSIS — B20 Human immunodeficiency virus [HIV] disease: Secondary | ICD-10-CM | POA: Diagnosis not present

## 2021-10-28 DIAGNOSIS — I120 Hypertensive chronic kidney disease with stage 5 chronic kidney disease or end stage renal disease: Secondary | ICD-10-CM | POA: Diagnosis not present

## 2021-10-28 DIAGNOSIS — N186 End stage renal disease: Secondary | ICD-10-CM | POA: Diagnosis not present

## 2021-10-28 DIAGNOSIS — S72114D Nondisplaced fracture of greater trochanter of right femur, subsequent encounter for closed fracture with routine healing: Secondary | ICD-10-CM | POA: Diagnosis not present

## 2021-10-28 DIAGNOSIS — F419 Anxiety disorder, unspecified: Secondary | ICD-10-CM | POA: Diagnosis not present

## 2021-10-28 DIAGNOSIS — M5136 Other intervertebral disc degeneration, lumbar region: Secondary | ICD-10-CM | POA: Diagnosis not present

## 2021-10-28 DIAGNOSIS — E1122 Type 2 diabetes mellitus with diabetic chronic kidney disease: Secondary | ICD-10-CM | POA: Diagnosis not present

## 2021-10-28 DIAGNOSIS — I70219 Atherosclerosis of native arteries of extremities with intermittent claudication, unspecified extremity: Secondary | ICD-10-CM | POA: Diagnosis not present

## 2021-10-28 DIAGNOSIS — I2510 Atherosclerotic heart disease of native coronary artery without angina pectoris: Secondary | ICD-10-CM | POA: Diagnosis not present

## 2021-10-28 DIAGNOSIS — E1151 Type 2 diabetes mellitus with diabetic peripheral angiopathy without gangrene: Secondary | ICD-10-CM | POA: Diagnosis not present

## 2021-10-28 DIAGNOSIS — E7800 Pure hypercholesterolemia, unspecified: Secondary | ICD-10-CM | POA: Diagnosis not present

## 2021-10-28 DIAGNOSIS — J449 Chronic obstructive pulmonary disease, unspecified: Secondary | ICD-10-CM | POA: Diagnosis not present

## 2021-10-28 DIAGNOSIS — M810 Age-related osteoporosis without current pathological fracture: Secondary | ICD-10-CM | POA: Diagnosis not present

## 2021-10-28 DIAGNOSIS — M797 Fibromyalgia: Secondary | ICD-10-CM | POA: Diagnosis not present

## 2021-10-28 DIAGNOSIS — M722 Plantar fascial fibromatosis: Secondary | ICD-10-CM | POA: Diagnosis not present

## 2021-10-28 DIAGNOSIS — F329 Major depressive disorder, single episode, unspecified: Secondary | ICD-10-CM | POA: Diagnosis not present

## 2021-11-04 DIAGNOSIS — S4291XD Fracture of right shoulder girdle, part unspecified, subsequent encounter for fracture with routine healing: Secondary | ICD-10-CM | POA: Diagnosis not present

## 2021-11-11 DIAGNOSIS — J449 Chronic obstructive pulmonary disease, unspecified: Secondary | ICD-10-CM | POA: Diagnosis not present

## 2021-11-11 DIAGNOSIS — Z6834 Body mass index (BMI) 34.0-34.9, adult: Secondary | ICD-10-CM | POA: Diagnosis not present

## 2021-11-11 DIAGNOSIS — Z720 Tobacco use: Secondary | ICD-10-CM | POA: Diagnosis not present

## 2021-11-11 DIAGNOSIS — E1165 Type 2 diabetes mellitus with hyperglycemia: Secondary | ICD-10-CM | POA: Diagnosis not present

## 2021-11-11 DIAGNOSIS — I509 Heart failure, unspecified: Secondary | ICD-10-CM | POA: Diagnosis not present

## 2021-11-11 DIAGNOSIS — E1122 Type 2 diabetes mellitus with diabetic chronic kidney disease: Secondary | ICD-10-CM | POA: Diagnosis not present

## 2021-11-11 DIAGNOSIS — I110 Hypertensive heart disease with heart failure: Secondary | ICD-10-CM | POA: Diagnosis not present

## 2021-11-11 DIAGNOSIS — Z992 Dependence on renal dialysis: Secondary | ICD-10-CM | POA: Diagnosis not present

## 2021-11-11 DIAGNOSIS — S72141D Displaced intertrochanteric fracture of right femur, subsequent encounter for closed fracture with routine healing: Secondary | ICD-10-CM | POA: Diagnosis not present

## 2021-11-11 DIAGNOSIS — W19XXXD Unspecified fall, subsequent encounter: Secondary | ICD-10-CM | POA: Diagnosis not present

## 2021-11-11 DIAGNOSIS — Z794 Long term (current) use of insulin: Secondary | ICD-10-CM | POA: Diagnosis not present

## 2021-11-11 DIAGNOSIS — E261 Secondary hyperaldosteronism: Secondary | ICD-10-CM | POA: Diagnosis not present

## 2021-11-11 DIAGNOSIS — N186 End stage renal disease: Secondary | ICD-10-CM | POA: Diagnosis not present

## 2021-11-14 DIAGNOSIS — N186 End stage renal disease: Secondary | ICD-10-CM | POA: Diagnosis not present

## 2021-11-14 DIAGNOSIS — Z992 Dependence on renal dialysis: Secondary | ICD-10-CM | POA: Diagnosis not present

## 2021-11-15 DIAGNOSIS — N2589 Other disorders resulting from impaired renal tubular function: Secondary | ICD-10-CM | POA: Diagnosis not present

## 2021-11-15 DIAGNOSIS — D631 Anemia in chronic kidney disease: Secondary | ICD-10-CM | POA: Diagnosis not present

## 2021-11-15 DIAGNOSIS — Z79899 Other long term (current) drug therapy: Secondary | ICD-10-CM | POA: Diagnosis not present

## 2021-11-15 DIAGNOSIS — D509 Iron deficiency anemia, unspecified: Secondary | ICD-10-CM | POA: Diagnosis not present

## 2021-11-15 DIAGNOSIS — Z992 Dependence on renal dialysis: Secondary | ICD-10-CM | POA: Diagnosis not present

## 2021-11-15 DIAGNOSIS — N186 End stage renal disease: Secondary | ICD-10-CM | POA: Diagnosis not present

## 2021-11-15 DIAGNOSIS — E440 Moderate protein-calorie malnutrition: Secondary | ICD-10-CM | POA: Diagnosis not present

## 2021-11-15 DIAGNOSIS — R82998 Other abnormal findings in urine: Secondary | ICD-10-CM | POA: Diagnosis not present

## 2021-11-15 DIAGNOSIS — N2581 Secondary hyperparathyroidism of renal origin: Secondary | ICD-10-CM | POA: Diagnosis not present

## 2021-11-17 DIAGNOSIS — M810 Age-related osteoporosis without current pathological fracture: Secondary | ICD-10-CM | POA: Diagnosis not present

## 2021-11-17 DIAGNOSIS — E1151 Type 2 diabetes mellitus with diabetic peripheral angiopathy without gangrene: Secondary | ICD-10-CM | POA: Diagnosis not present

## 2021-11-17 DIAGNOSIS — K589 Irritable bowel syndrome without diarrhea: Secondary | ICD-10-CM | POA: Diagnosis not present

## 2021-11-17 DIAGNOSIS — I2510 Atherosclerotic heart disease of native coronary artery without angina pectoris: Secondary | ICD-10-CM | POA: Diagnosis not present

## 2021-11-17 DIAGNOSIS — J449 Chronic obstructive pulmonary disease, unspecified: Secondary | ICD-10-CM | POA: Diagnosis not present

## 2021-11-17 DIAGNOSIS — E1142 Type 2 diabetes mellitus with diabetic polyneuropathy: Secondary | ICD-10-CM | POA: Diagnosis not present

## 2021-11-17 DIAGNOSIS — F419 Anxiety disorder, unspecified: Secondary | ICD-10-CM | POA: Diagnosis not present

## 2021-11-17 DIAGNOSIS — F329 Major depressive disorder, single episode, unspecified: Secondary | ICD-10-CM | POA: Diagnosis not present

## 2021-11-17 DIAGNOSIS — M5416 Radiculopathy, lumbar region: Secondary | ICD-10-CM | POA: Diagnosis not present

## 2021-11-17 DIAGNOSIS — G4700 Insomnia, unspecified: Secondary | ICD-10-CM | POA: Diagnosis not present

## 2021-11-17 DIAGNOSIS — K439 Ventral hernia without obstruction or gangrene: Secondary | ICD-10-CM | POA: Diagnosis not present

## 2021-11-17 DIAGNOSIS — M722 Plantar fascial fibromatosis: Secondary | ICD-10-CM | POA: Diagnosis not present

## 2021-11-17 DIAGNOSIS — M797 Fibromyalgia: Secondary | ICD-10-CM | POA: Diagnosis not present

## 2021-11-17 DIAGNOSIS — E079 Disorder of thyroid, unspecified: Secondary | ICD-10-CM | POA: Diagnosis not present

## 2021-11-17 DIAGNOSIS — E7800 Pure hypercholesterolemia, unspecified: Secondary | ICD-10-CM | POA: Diagnosis not present

## 2021-11-17 DIAGNOSIS — I70219 Atherosclerosis of native arteries of extremities with intermittent claudication, unspecified extremity: Secondary | ICD-10-CM | POA: Diagnosis not present

## 2021-11-17 DIAGNOSIS — S72114D Nondisplaced fracture of greater trochanter of right femur, subsequent encounter for closed fracture with routine healing: Secondary | ICD-10-CM | POA: Diagnosis not present

## 2021-11-17 DIAGNOSIS — S42294D Other nondisplaced fracture of upper end of right humerus, subsequent encounter for fracture with routine healing: Secondary | ICD-10-CM | POA: Diagnosis not present

## 2021-11-17 DIAGNOSIS — I120 Hypertensive chronic kidney disease with stage 5 chronic kidney disease or end stage renal disease: Secondary | ICD-10-CM | POA: Diagnosis not present

## 2021-11-17 DIAGNOSIS — B20 Human immunodeficiency virus [HIV] disease: Secondary | ICD-10-CM | POA: Diagnosis not present

## 2021-11-17 DIAGNOSIS — M5136 Other intervertebral disc degeneration, lumbar region: Secondary | ICD-10-CM | POA: Diagnosis not present

## 2021-11-17 DIAGNOSIS — N186 End stage renal disease: Secondary | ICD-10-CM | POA: Diagnosis not present

## 2021-11-17 DIAGNOSIS — E1122 Type 2 diabetes mellitus with diabetic chronic kidney disease: Secondary | ICD-10-CM | POA: Diagnosis not present

## 2021-11-25 DIAGNOSIS — E1165 Type 2 diabetes mellitus with hyperglycemia: Secondary | ICD-10-CM | POA: Diagnosis not present

## 2021-11-26 DIAGNOSIS — S72114A Nondisplaced fracture of greater trochanter of right femur, initial encounter for closed fracture: Secondary | ICD-10-CM | POA: Diagnosis not present

## 2021-11-26 DIAGNOSIS — M19011 Primary osteoarthritis, right shoulder: Secondary | ICD-10-CM | POA: Diagnosis not present

## 2021-12-14 DIAGNOSIS — E162 Hypoglycemia, unspecified: Secondary | ICD-10-CM | POA: Diagnosis not present

## 2021-12-14 DIAGNOSIS — R809 Proteinuria, unspecified: Secondary | ICD-10-CM | POA: Diagnosis not present

## 2021-12-14 DIAGNOSIS — G609 Hereditary and idiopathic neuropathy, unspecified: Secondary | ICD-10-CM | POA: Diagnosis not present

## 2021-12-14 DIAGNOSIS — E7800 Pure hypercholesterolemia, unspecified: Secondary | ICD-10-CM | POA: Diagnosis not present

## 2021-12-14 DIAGNOSIS — E1165 Type 2 diabetes mellitus with hyperglycemia: Secondary | ICD-10-CM | POA: Diagnosis not present

## 2021-12-14 DIAGNOSIS — I10 Essential (primary) hypertension: Secondary | ICD-10-CM | POA: Diagnosis not present

## 2021-12-14 DIAGNOSIS — N186 End stage renal disease: Secondary | ICD-10-CM | POA: Diagnosis not present

## 2021-12-14 DIAGNOSIS — M810 Age-related osteoporosis without current pathological fracture: Secondary | ICD-10-CM | POA: Diagnosis not present

## 2021-12-15 ENCOUNTER — Encounter: Payer: PPO | Admitting: Physical Medicine and Rehabilitation

## 2021-12-15 DIAGNOSIS — Z992 Dependence on renal dialysis: Secondary | ICD-10-CM | POA: Diagnosis not present

## 2021-12-15 DIAGNOSIS — N186 End stage renal disease: Secondary | ICD-10-CM | POA: Diagnosis not present

## 2021-12-16 DIAGNOSIS — Z79899 Other long term (current) drug therapy: Secondary | ICD-10-CM | POA: Diagnosis not present

## 2021-12-16 DIAGNOSIS — N2581 Secondary hyperparathyroidism of renal origin: Secondary | ICD-10-CM | POA: Diagnosis not present

## 2021-12-16 DIAGNOSIS — R82998 Other abnormal findings in urine: Secondary | ICD-10-CM | POA: Diagnosis not present

## 2021-12-16 DIAGNOSIS — E440 Moderate protein-calorie malnutrition: Secondary | ICD-10-CM | POA: Diagnosis not present

## 2021-12-16 DIAGNOSIS — Z992 Dependence on renal dialysis: Secondary | ICD-10-CM | POA: Diagnosis not present

## 2021-12-16 DIAGNOSIS — N2589 Other disorders resulting from impaired renal tubular function: Secondary | ICD-10-CM | POA: Diagnosis not present

## 2021-12-16 DIAGNOSIS — D631 Anemia in chronic kidney disease: Secondary | ICD-10-CM | POA: Diagnosis not present

## 2021-12-16 DIAGNOSIS — N186 End stage renal disease: Secondary | ICD-10-CM | POA: Diagnosis not present

## 2021-12-16 DIAGNOSIS — D509 Iron deficiency anemia, unspecified: Secondary | ICD-10-CM | POA: Diagnosis not present

## 2021-12-18 DIAGNOSIS — M5416 Radiculopathy, lumbar region: Secondary | ICD-10-CM | POA: Diagnosis not present

## 2021-12-18 DIAGNOSIS — J449 Chronic obstructive pulmonary disease, unspecified: Secondary | ICD-10-CM | POA: Diagnosis not present

## 2021-12-18 DIAGNOSIS — M722 Plantar fascial fibromatosis: Secondary | ICD-10-CM | POA: Diagnosis not present

## 2021-12-18 DIAGNOSIS — K589 Irritable bowel syndrome without diarrhea: Secondary | ICD-10-CM | POA: Diagnosis not present

## 2021-12-18 DIAGNOSIS — K439 Ventral hernia without obstruction or gangrene: Secondary | ICD-10-CM | POA: Diagnosis not present

## 2021-12-18 DIAGNOSIS — E1142 Type 2 diabetes mellitus with diabetic polyneuropathy: Secondary | ICD-10-CM | POA: Diagnosis not present

## 2021-12-18 DIAGNOSIS — S42294D Other nondisplaced fracture of upper end of right humerus, subsequent encounter for fracture with routine healing: Secondary | ICD-10-CM | POA: Diagnosis not present

## 2021-12-18 DIAGNOSIS — F329 Major depressive disorder, single episode, unspecified: Secondary | ICD-10-CM | POA: Diagnosis not present

## 2021-12-18 DIAGNOSIS — I2510 Atherosclerotic heart disease of native coronary artery without angina pectoris: Secondary | ICD-10-CM | POA: Diagnosis not present

## 2021-12-18 DIAGNOSIS — E079 Disorder of thyroid, unspecified: Secondary | ICD-10-CM | POA: Diagnosis not present

## 2021-12-18 DIAGNOSIS — S72114D Nondisplaced fracture of greater trochanter of right femur, subsequent encounter for closed fracture with routine healing: Secondary | ICD-10-CM | POA: Diagnosis not present

## 2021-12-18 DIAGNOSIS — M5136 Other intervertebral disc degeneration, lumbar region: Secondary | ICD-10-CM | POA: Diagnosis not present

## 2021-12-18 DIAGNOSIS — B20 Human immunodeficiency virus [HIV] disease: Secondary | ICD-10-CM | POA: Diagnosis not present

## 2021-12-18 DIAGNOSIS — I120 Hypertensive chronic kidney disease with stage 5 chronic kidney disease or end stage renal disease: Secondary | ICD-10-CM | POA: Diagnosis not present

## 2021-12-18 DIAGNOSIS — E1151 Type 2 diabetes mellitus with diabetic peripheral angiopathy without gangrene: Secondary | ICD-10-CM | POA: Diagnosis not present

## 2021-12-18 DIAGNOSIS — N186 End stage renal disease: Secondary | ICD-10-CM | POA: Diagnosis not present

## 2021-12-18 DIAGNOSIS — F419 Anxiety disorder, unspecified: Secondary | ICD-10-CM | POA: Diagnosis not present

## 2021-12-18 DIAGNOSIS — E7800 Pure hypercholesterolemia, unspecified: Secondary | ICD-10-CM | POA: Diagnosis not present

## 2021-12-18 DIAGNOSIS — M797 Fibromyalgia: Secondary | ICD-10-CM | POA: Diagnosis not present

## 2021-12-18 DIAGNOSIS — I70219 Atherosclerosis of native arteries of extremities with intermittent claudication, unspecified extremity: Secondary | ICD-10-CM | POA: Diagnosis not present

## 2021-12-18 DIAGNOSIS — E1122 Type 2 diabetes mellitus with diabetic chronic kidney disease: Secondary | ICD-10-CM | POA: Diagnosis not present

## 2021-12-18 DIAGNOSIS — M810 Age-related osteoporosis without current pathological fracture: Secondary | ICD-10-CM | POA: Diagnosis not present

## 2021-12-18 DIAGNOSIS — G4700 Insomnia, unspecified: Secondary | ICD-10-CM | POA: Diagnosis not present

## 2021-12-21 DIAGNOSIS — D692 Other nonthrombocytopenic purpura: Secondary | ICD-10-CM | POA: Diagnosis not present

## 2021-12-21 DIAGNOSIS — Z794 Long term (current) use of insulin: Secondary | ICD-10-CM | POA: Diagnosis not present

## 2021-12-21 DIAGNOSIS — I110 Hypertensive heart disease with heart failure: Secondary | ICD-10-CM | POA: Diagnosis not present

## 2021-12-21 DIAGNOSIS — E1165 Type 2 diabetes mellitus with hyperglycemia: Secondary | ICD-10-CM | POA: Diagnosis not present

## 2021-12-21 DIAGNOSIS — I25118 Atherosclerotic heart disease of native coronary artery with other forms of angina pectoris: Secondary | ICD-10-CM | POA: Diagnosis not present

## 2021-12-21 DIAGNOSIS — I509 Heart failure, unspecified: Secondary | ICD-10-CM | POA: Diagnosis not present

## 2021-12-21 DIAGNOSIS — E1169 Type 2 diabetes mellitus with other specified complication: Secondary | ICD-10-CM | POA: Diagnosis not present

## 2021-12-21 DIAGNOSIS — E1151 Type 2 diabetes mellitus with diabetic peripheral angiopathy without gangrene: Secondary | ICD-10-CM | POA: Diagnosis not present

## 2021-12-21 DIAGNOSIS — E1122 Type 2 diabetes mellitus with diabetic chronic kidney disease: Secondary | ICD-10-CM | POA: Diagnosis not present

## 2021-12-21 DIAGNOSIS — N2581 Secondary hyperparathyroidism of renal origin: Secondary | ICD-10-CM | POA: Diagnosis not present

## 2021-12-21 DIAGNOSIS — F331 Major depressive disorder, recurrent, moderate: Secondary | ICD-10-CM | POA: Diagnosis not present

## 2021-12-22 DIAGNOSIS — E1142 Type 2 diabetes mellitus with diabetic polyneuropathy: Secondary | ICD-10-CM | POA: Diagnosis not present

## 2021-12-22 DIAGNOSIS — B20 Human immunodeficiency virus [HIV] disease: Secondary | ICD-10-CM | POA: Diagnosis not present

## 2021-12-22 DIAGNOSIS — I70219 Atherosclerosis of native arteries of extremities with intermittent claudication, unspecified extremity: Secondary | ICD-10-CM | POA: Diagnosis not present

## 2021-12-22 DIAGNOSIS — J449 Chronic obstructive pulmonary disease, unspecified: Secondary | ICD-10-CM | POA: Diagnosis not present

## 2021-12-22 DIAGNOSIS — K439 Ventral hernia without obstruction or gangrene: Secondary | ICD-10-CM | POA: Diagnosis not present

## 2021-12-22 DIAGNOSIS — I2510 Atherosclerotic heart disease of native coronary artery without angina pectoris: Secondary | ICD-10-CM | POA: Diagnosis not present

## 2021-12-22 DIAGNOSIS — E1151 Type 2 diabetes mellitus with diabetic peripheral angiopathy without gangrene: Secondary | ICD-10-CM | POA: Diagnosis not present

## 2021-12-25 DIAGNOSIS — E1165 Type 2 diabetes mellitus with hyperglycemia: Secondary | ICD-10-CM | POA: Diagnosis not present

## 2021-12-31 IMAGING — DX DG HUMERUS 2V *R*
2 series · 2 of 2 positions shown · non-contrast
Comparison: Shoulder radiograph yesterday

CLINICAL DATA: Lost balance leading to fall.  Right arm pain.

EXAM:
RIGHT HUMERUS - 2+ VIEW

[humerus ap]
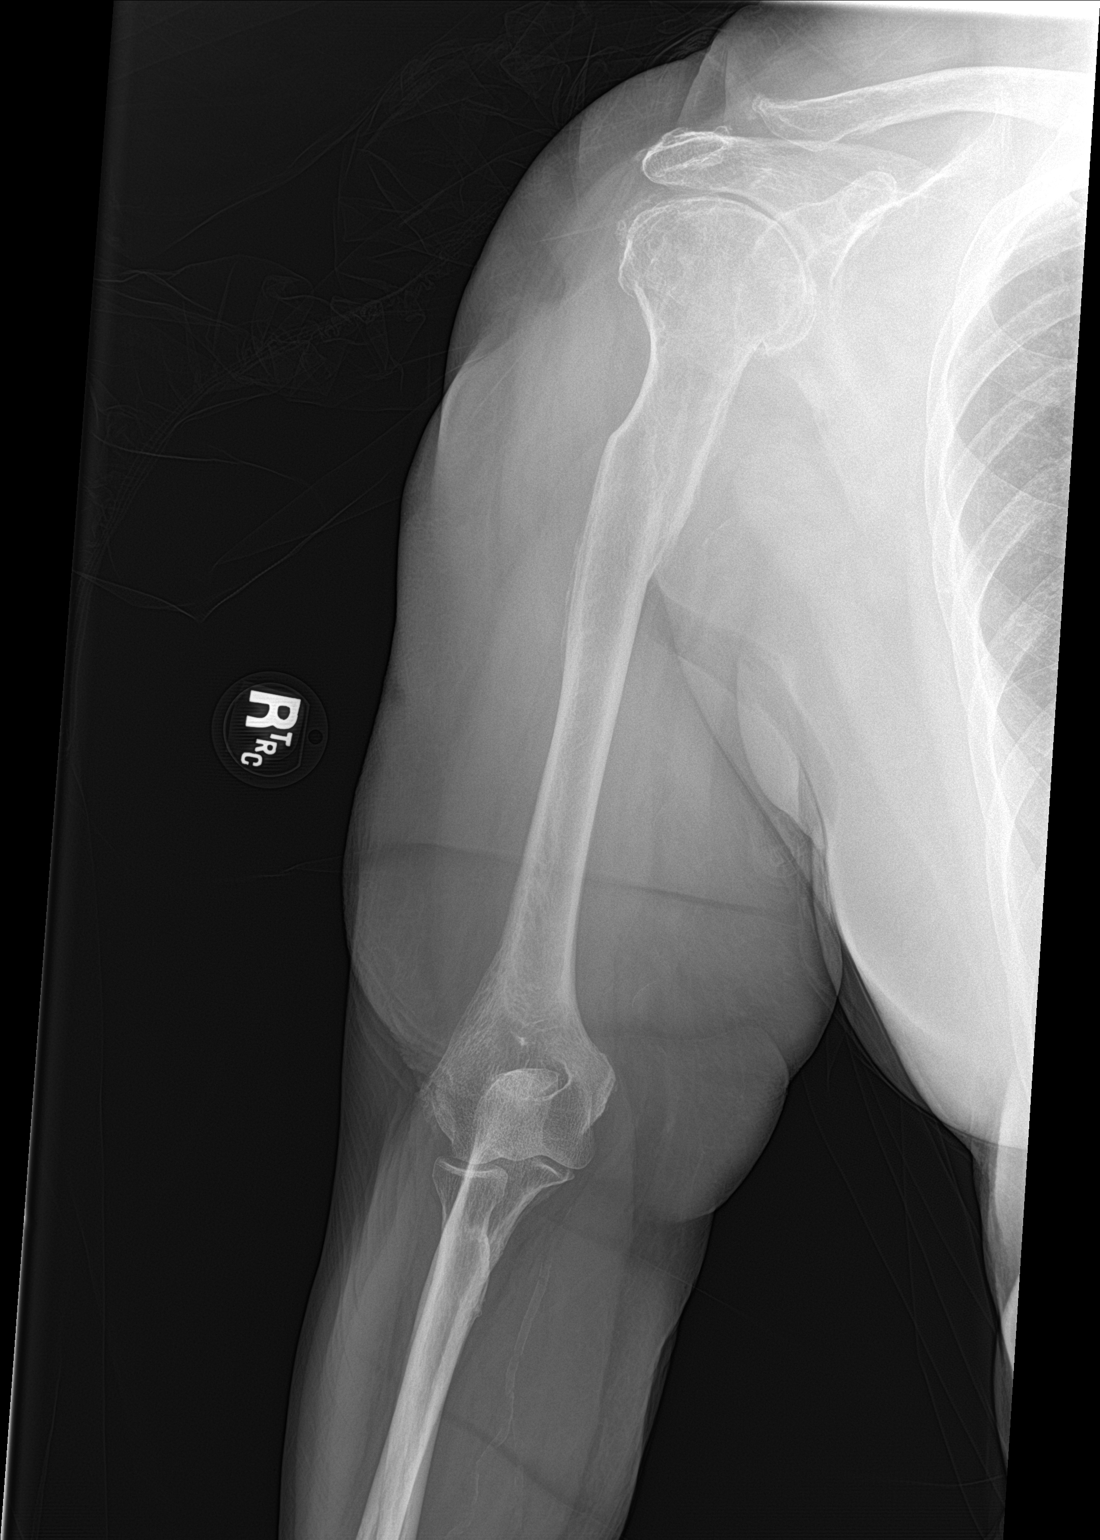

[humerus lat]
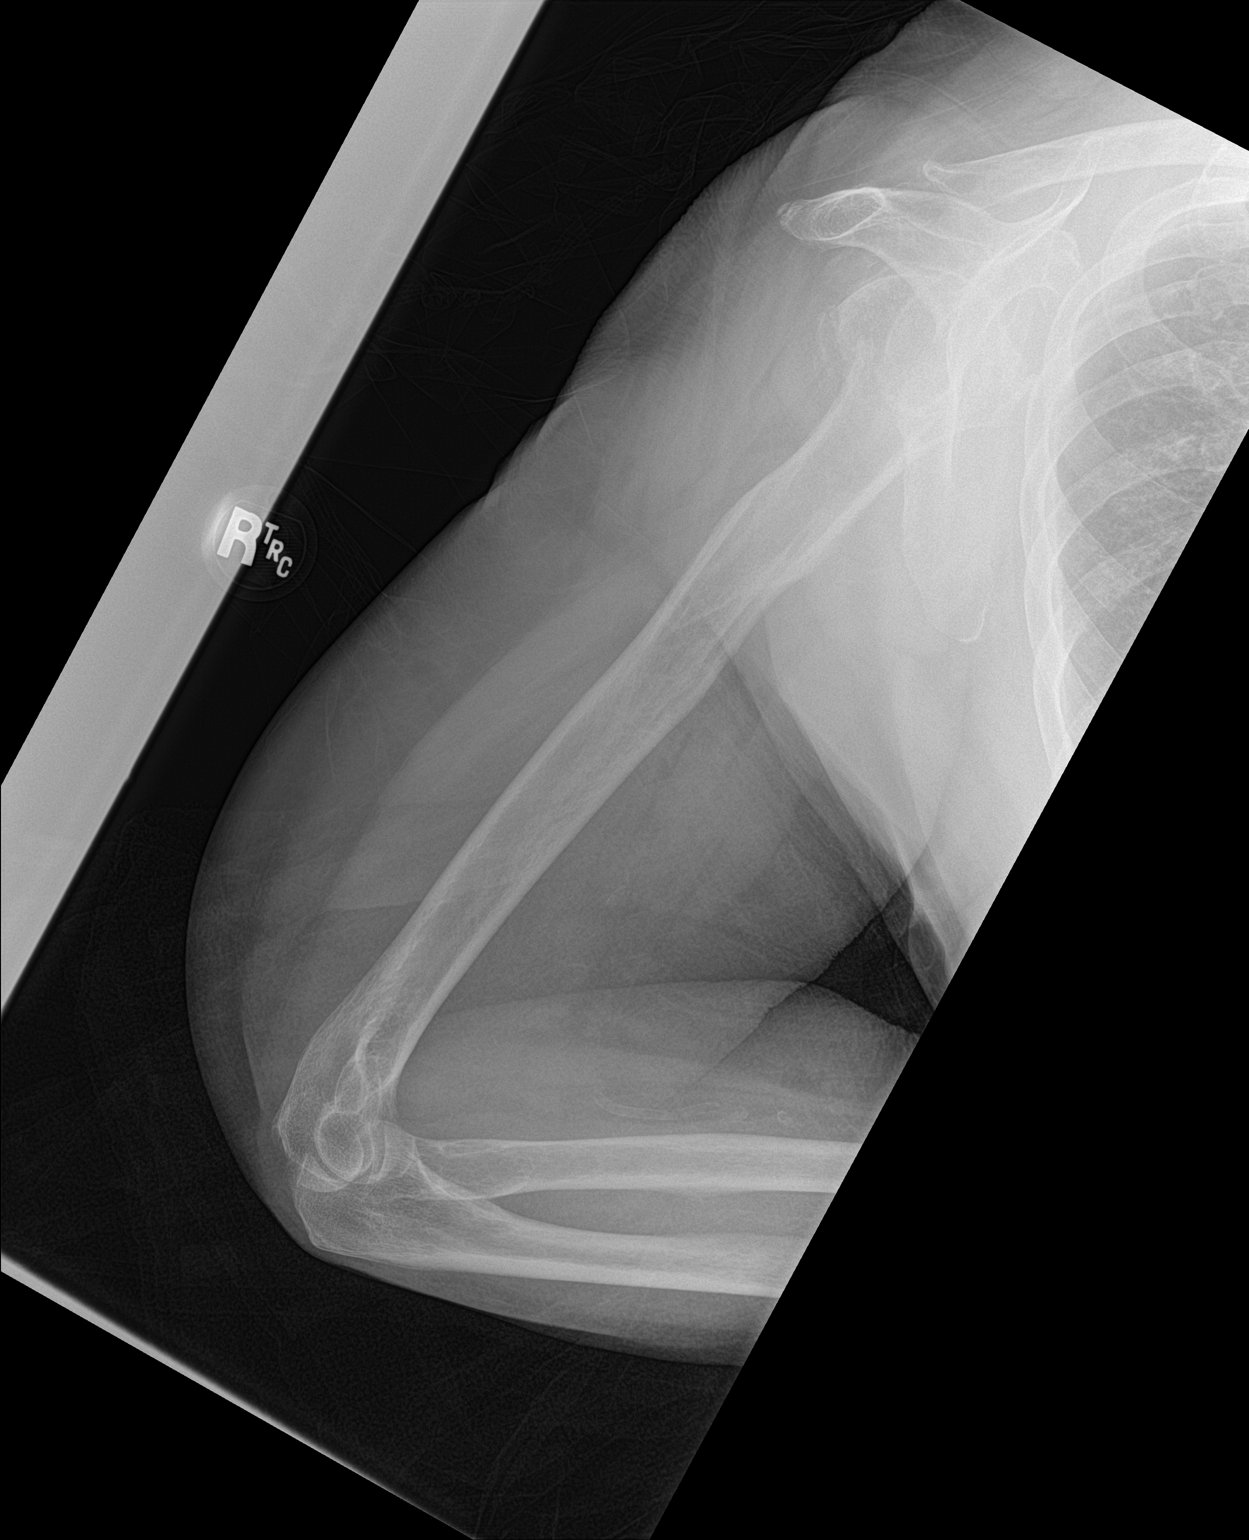

[2 of 2 positions shown; findings below may reference images not displayed]

FINDINGS: Possible fracture arising from the lateral humeral head, not
definitively seen on prior shoulder exam. Remote healed proximal
humeral shaft fracture. No focal soft tissue abnormalities.
IMPRESSION: 1. Possible fracture arising from the lateral humeral head. This was
not confirmed on concurrent shoulder exam. If there is focal
tenderness in this region, consider CT.
2. Remote healed proximal humeral shaft fracture.

## 2022-01-05 DIAGNOSIS — E1122 Type 2 diabetes mellitus with diabetic chronic kidney disease: Secondary | ICD-10-CM | POA: Diagnosis not present

## 2022-01-05 DIAGNOSIS — F329 Major depressive disorder, single episode, unspecified: Secondary | ICD-10-CM | POA: Diagnosis not present

## 2022-01-05 DIAGNOSIS — I2510 Atherosclerotic heart disease of native coronary artery without angina pectoris: Secondary | ICD-10-CM | POA: Diagnosis not present

## 2022-01-05 DIAGNOSIS — S72114D Nondisplaced fracture of greater trochanter of right femur, subsequent encounter for closed fracture with routine healing: Secondary | ICD-10-CM | POA: Diagnosis not present

## 2022-01-05 DIAGNOSIS — J449 Chronic obstructive pulmonary disease, unspecified: Secondary | ICD-10-CM | POA: Diagnosis not present

## 2022-01-05 DIAGNOSIS — M722 Plantar fascial fibromatosis: Secondary | ICD-10-CM | POA: Diagnosis not present

## 2022-01-05 DIAGNOSIS — E1142 Type 2 diabetes mellitus with diabetic polyneuropathy: Secondary | ICD-10-CM | POA: Diagnosis not present

## 2022-01-05 DIAGNOSIS — E079 Disorder of thyroid, unspecified: Secondary | ICD-10-CM | POA: Diagnosis not present

## 2022-01-05 DIAGNOSIS — M810 Age-related osteoporosis without current pathological fracture: Secondary | ICD-10-CM | POA: Diagnosis not present

## 2022-01-05 DIAGNOSIS — M797 Fibromyalgia: Secondary | ICD-10-CM | POA: Diagnosis not present

## 2022-01-05 DIAGNOSIS — F419 Anxiety disorder, unspecified: Secondary | ICD-10-CM | POA: Diagnosis not present

## 2022-01-05 DIAGNOSIS — I70219 Atherosclerosis of native arteries of extremities with intermittent claudication, unspecified extremity: Secondary | ICD-10-CM | POA: Diagnosis not present

## 2022-01-05 DIAGNOSIS — K439 Ventral hernia without obstruction or gangrene: Secondary | ICD-10-CM | POA: Diagnosis not present

## 2022-01-05 DIAGNOSIS — N186 End stage renal disease: Secondary | ICD-10-CM | POA: Diagnosis not present

## 2022-01-05 DIAGNOSIS — I120 Hypertensive chronic kidney disease with stage 5 chronic kidney disease or end stage renal disease: Secondary | ICD-10-CM | POA: Diagnosis not present

## 2022-01-05 DIAGNOSIS — E7800 Pure hypercholesterolemia, unspecified: Secondary | ICD-10-CM | POA: Diagnosis not present

## 2022-01-05 DIAGNOSIS — M5136 Other intervertebral disc degeneration, lumbar region: Secondary | ICD-10-CM | POA: Diagnosis not present

## 2022-01-05 DIAGNOSIS — G4700 Insomnia, unspecified: Secondary | ICD-10-CM | POA: Diagnosis not present

## 2022-01-05 DIAGNOSIS — B20 Human immunodeficiency virus [HIV] disease: Secondary | ICD-10-CM | POA: Diagnosis not present

## 2022-01-05 DIAGNOSIS — S42294D Other nondisplaced fracture of upper end of right humerus, subsequent encounter for fracture with routine healing: Secondary | ICD-10-CM | POA: Diagnosis not present

## 2022-01-05 DIAGNOSIS — M5416 Radiculopathy, lumbar region: Secondary | ICD-10-CM | POA: Diagnosis not present

## 2022-01-05 DIAGNOSIS — K589 Irritable bowel syndrome without diarrhea: Secondary | ICD-10-CM | POA: Diagnosis not present

## 2022-01-05 DIAGNOSIS — E1151 Type 2 diabetes mellitus with diabetic peripheral angiopathy without gangrene: Secondary | ICD-10-CM | POA: Diagnosis not present

## 2022-01-08 DIAGNOSIS — F419 Anxiety disorder, unspecified: Secondary | ICD-10-CM | POA: Diagnosis not present

## 2022-01-08 DIAGNOSIS — Z992 Dependence on renal dialysis: Secondary | ICD-10-CM | POA: Diagnosis not present

## 2022-01-08 DIAGNOSIS — F331 Major depressive disorder, recurrent, moderate: Secondary | ICD-10-CM | POA: Diagnosis not present

## 2022-01-12 DIAGNOSIS — N186 End stage renal disease: Secondary | ICD-10-CM | POA: Diagnosis not present

## 2022-01-12 DIAGNOSIS — Z992 Dependence on renal dialysis: Secondary | ICD-10-CM | POA: Diagnosis not present

## 2022-01-13 DIAGNOSIS — N2589 Other disorders resulting from impaired renal tubular function: Secondary | ICD-10-CM | POA: Diagnosis not present

## 2022-01-13 DIAGNOSIS — N186 End stage renal disease: Secondary | ICD-10-CM | POA: Diagnosis not present

## 2022-01-13 DIAGNOSIS — E875 Hyperkalemia: Secondary | ICD-10-CM | POA: Diagnosis not present

## 2022-01-13 DIAGNOSIS — F5104 Psychophysiologic insomnia: Secondary | ICD-10-CM | POA: Diagnosis not present

## 2022-01-13 DIAGNOSIS — Z794 Long term (current) use of insulin: Secondary | ICD-10-CM | POA: Diagnosis not present

## 2022-01-13 DIAGNOSIS — R82998 Other abnormal findings in urine: Secondary | ICD-10-CM | POA: Diagnosis not present

## 2022-01-13 DIAGNOSIS — I10 Essential (primary) hypertension: Secondary | ICD-10-CM | POA: Diagnosis not present

## 2022-01-13 DIAGNOSIS — D509 Iron deficiency anemia, unspecified: Secondary | ICD-10-CM | POA: Diagnosis not present

## 2022-01-13 DIAGNOSIS — A09 Infectious gastroenteritis and colitis, unspecified: Secondary | ICD-10-CM | POA: Diagnosis not present

## 2022-01-13 DIAGNOSIS — E118 Type 2 diabetes mellitus with unspecified complications: Secondary | ICD-10-CM | POA: Diagnosis not present

## 2022-01-13 DIAGNOSIS — D631 Anemia in chronic kidney disease: Secondary | ICD-10-CM | POA: Diagnosis not present

## 2022-01-13 DIAGNOSIS — K650 Generalized (acute) peritonitis: Secondary | ICD-10-CM | POA: Diagnosis not present

## 2022-01-13 DIAGNOSIS — K769 Liver disease, unspecified: Secondary | ICD-10-CM | POA: Diagnosis not present

## 2022-01-13 DIAGNOSIS — N2581 Secondary hyperparathyroidism of renal origin: Secondary | ICD-10-CM | POA: Diagnosis not present

## 2022-01-13 DIAGNOSIS — Z992 Dependence on renal dialysis: Secondary | ICD-10-CM | POA: Diagnosis not present

## 2022-01-13 DIAGNOSIS — R880 Cloudy (hemodialysis) (peritoneal) dialysis effluent: Secondary | ICD-10-CM | POA: Diagnosis not present

## 2022-01-13 DIAGNOSIS — E1129 Type 2 diabetes mellitus with other diabetic kidney complication: Secondary | ICD-10-CM | POA: Diagnosis not present

## 2022-01-14 DIAGNOSIS — A09 Infectious gastroenteritis and colitis, unspecified: Secondary | ICD-10-CM | POA: Diagnosis not present

## 2022-01-25 DIAGNOSIS — E1165 Type 2 diabetes mellitus with hyperglycemia: Secondary | ICD-10-CM | POA: Diagnosis not present

## 2022-02-02 DIAGNOSIS — E1142 Type 2 diabetes mellitus with diabetic polyneuropathy: Secondary | ICD-10-CM | POA: Diagnosis not present

## 2022-02-02 DIAGNOSIS — M797 Fibromyalgia: Secondary | ICD-10-CM | POA: Diagnosis not present

## 2022-02-02 DIAGNOSIS — M5136 Other intervertebral disc degeneration, lumbar region: Secondary | ICD-10-CM | POA: Diagnosis not present

## 2022-02-02 DIAGNOSIS — E7800 Pure hypercholesterolemia, unspecified: Secondary | ICD-10-CM | POA: Diagnosis not present

## 2022-02-02 DIAGNOSIS — E1151 Type 2 diabetes mellitus with diabetic peripheral angiopathy without gangrene: Secondary | ICD-10-CM | POA: Diagnosis not present

## 2022-02-02 DIAGNOSIS — I120 Hypertensive chronic kidney disease with stage 5 chronic kidney disease or end stage renal disease: Secondary | ICD-10-CM | POA: Diagnosis not present

## 2022-02-02 DIAGNOSIS — K439 Ventral hernia without obstruction or gangrene: Secondary | ICD-10-CM | POA: Diagnosis not present

## 2022-02-02 DIAGNOSIS — F419 Anxiety disorder, unspecified: Secondary | ICD-10-CM | POA: Diagnosis not present

## 2022-02-02 DIAGNOSIS — G4700 Insomnia, unspecified: Secondary | ICD-10-CM | POA: Diagnosis not present

## 2022-02-02 DIAGNOSIS — M722 Plantar fascial fibromatosis: Secondary | ICD-10-CM | POA: Diagnosis not present

## 2022-02-02 DIAGNOSIS — E1122 Type 2 diabetes mellitus with diabetic chronic kidney disease: Secondary | ICD-10-CM | POA: Diagnosis not present

## 2022-02-02 DIAGNOSIS — M5416 Radiculopathy, lumbar region: Secondary | ICD-10-CM | POA: Diagnosis not present

## 2022-02-02 DIAGNOSIS — K589 Irritable bowel syndrome without diarrhea: Secondary | ICD-10-CM | POA: Diagnosis not present

## 2022-02-02 DIAGNOSIS — J449 Chronic obstructive pulmonary disease, unspecified: Secondary | ICD-10-CM | POA: Diagnosis not present

## 2022-02-02 DIAGNOSIS — N186 End stage renal disease: Secondary | ICD-10-CM | POA: Diagnosis not present

## 2022-02-02 DIAGNOSIS — S42294D Other nondisplaced fracture of upper end of right humerus, subsequent encounter for fracture with routine healing: Secondary | ICD-10-CM | POA: Diagnosis not present

## 2022-02-02 DIAGNOSIS — B20 Human immunodeficiency virus [HIV] disease: Secondary | ICD-10-CM | POA: Diagnosis not present

## 2022-02-02 DIAGNOSIS — M810 Age-related osteoporosis without current pathological fracture: Secondary | ICD-10-CM | POA: Diagnosis not present

## 2022-02-02 DIAGNOSIS — S72114D Nondisplaced fracture of greater trochanter of right femur, subsequent encounter for closed fracture with routine healing: Secondary | ICD-10-CM | POA: Diagnosis not present

## 2022-02-02 DIAGNOSIS — I70219 Atherosclerosis of native arteries of extremities with intermittent claudication, unspecified extremity: Secondary | ICD-10-CM | POA: Diagnosis not present

## 2022-02-02 DIAGNOSIS — I2510 Atherosclerotic heart disease of native coronary artery without angina pectoris: Secondary | ICD-10-CM | POA: Diagnosis not present

## 2022-02-02 DIAGNOSIS — E079 Disorder of thyroid, unspecified: Secondary | ICD-10-CM | POA: Diagnosis not present

## 2022-02-02 DIAGNOSIS — F329 Major depressive disorder, single episode, unspecified: Secondary | ICD-10-CM | POA: Diagnosis not present

## 2022-02-03 DIAGNOSIS — N186 End stage renal disease: Secondary | ICD-10-CM | POA: Diagnosis not present

## 2022-02-03 DIAGNOSIS — N2581 Secondary hyperparathyroidism of renal origin: Secondary | ICD-10-CM | POA: Diagnosis not present

## 2022-02-03 DIAGNOSIS — I5033 Acute on chronic diastolic (congestive) heart failure: Secondary | ICD-10-CM | POA: Diagnosis not present

## 2022-02-03 DIAGNOSIS — E118 Type 2 diabetes mellitus with unspecified complications: Secondary | ICD-10-CM | POA: Diagnosis not present

## 2022-02-03 DIAGNOSIS — I739 Peripheral vascular disease, unspecified: Secondary | ICD-10-CM | POA: Diagnosis not present

## 2022-02-03 DIAGNOSIS — Z0000 Encounter for general adult medical examination without abnormal findings: Secondary | ICD-10-CM | POA: Diagnosis not present

## 2022-02-03 DIAGNOSIS — M818 Other osteoporosis without current pathological fracture: Secondary | ICD-10-CM | POA: Diagnosis not present

## 2022-02-03 DIAGNOSIS — Z4932 Encounter for adequacy testing for peritoneal dialysis: Secondary | ICD-10-CM | POA: Diagnosis not present

## 2022-02-03 DIAGNOSIS — I25118 Atherosclerotic heart disease of native coronary artery with other forms of angina pectoris: Secondary | ICD-10-CM | POA: Diagnosis not present

## 2022-02-03 DIAGNOSIS — Z794 Long term (current) use of insulin: Secondary | ICD-10-CM | POA: Diagnosis not present

## 2022-02-03 DIAGNOSIS — J431 Panlobular emphysema: Secondary | ICD-10-CM | POA: Diagnosis not present

## 2022-02-03 DIAGNOSIS — I700 Atherosclerosis of aorta: Secondary | ICD-10-CM | POA: Diagnosis not present

## 2022-02-09 ENCOUNTER — Inpatient Hospital Stay
Admission: EM | Admit: 2022-02-09 | Discharge: 2022-02-14 | DRG: 291 | Disposition: A | Discharge status: Home Health | Payer: PPO | Attending: Hospitalist | Admitting: Hospitalist

## 2022-02-09 ENCOUNTER — Inpatient Hospital Stay (HOSPITAL_COMMUNITY)
Admit: 2022-02-09 | Discharge: 2022-02-09 | Disposition: A | Discharge status: Home / Self Care | Payer: PPO | Attending: Obstetrics and Gynecology | Admitting: Obstetrics and Gynecology

## 2022-02-09 ENCOUNTER — Emergency Department: Payer: PPO

## 2022-02-09 ENCOUNTER — Other Ambulatory Visit: Payer: Self-pay

## 2022-02-09 DIAGNOSIS — Z886 Allergy status to analgesic agent status: Secondary | ICD-10-CM

## 2022-02-09 DIAGNOSIS — G8929 Other chronic pain: Secondary | ICD-10-CM | POA: Diagnosis present

## 2022-02-09 DIAGNOSIS — Z20822 Contact with and (suspected) exposure to covid-19: Secondary | ICD-10-CM | POA: Diagnosis present

## 2022-02-09 DIAGNOSIS — R7989 Other specified abnormal findings of blood chemistry: Secondary | ICD-10-CM

## 2022-02-09 DIAGNOSIS — D631 Anemia in chronic kidney disease: Secondary | ICD-10-CM | POA: Diagnosis present

## 2022-02-09 DIAGNOSIS — Z992 Dependence on renal dialysis: Secondary | ICD-10-CM | POA: Diagnosis not present

## 2022-02-09 DIAGNOSIS — K650 Generalized (acute) peritonitis: Secondary | ICD-10-CM | POA: Diagnosis not present

## 2022-02-09 DIAGNOSIS — E1122 Type 2 diabetes mellitus with diabetic chronic kidney disease: Secondary | ICD-10-CM | POA: Diagnosis not present

## 2022-02-09 DIAGNOSIS — Z888 Allergy status to other drugs, medicaments and biological substances status: Secondary | ICD-10-CM

## 2022-02-09 DIAGNOSIS — R188 Other ascites: Secondary | ICD-10-CM | POA: Diagnosis not present

## 2022-02-09 DIAGNOSIS — J4489 Other specified chronic obstructive pulmonary disease: Secondary | ICD-10-CM | POA: Diagnosis present

## 2022-02-09 DIAGNOSIS — F32A Depression, unspecified: Secondary | ICD-10-CM | POA: Diagnosis not present

## 2022-02-09 DIAGNOSIS — I10 Essential (primary) hypertension: Secondary | ICD-10-CM | POA: Diagnosis present

## 2022-02-09 DIAGNOSIS — K4090 Unilateral inguinal hernia, without obstruction or gangrene, not specified as recurrent: Secondary | ICD-10-CM | POA: Diagnosis not present

## 2022-02-09 DIAGNOSIS — R739 Hyperglycemia, unspecified: Secondary | ICD-10-CM

## 2022-02-09 DIAGNOSIS — Z7982 Long term (current) use of aspirin: Secondary | ICD-10-CM

## 2022-02-09 DIAGNOSIS — Z905 Acquired absence of kidney: Secondary | ICD-10-CM | POA: Diagnosis not present

## 2022-02-09 DIAGNOSIS — R197 Diarrhea, unspecified: Secondary | ICD-10-CM | POA: Diagnosis not present

## 2022-02-09 DIAGNOSIS — I2510 Atherosclerotic heart disease of native coronary artery without angina pectoris: Secondary | ICD-10-CM | POA: Diagnosis present

## 2022-02-09 DIAGNOSIS — Z885 Allergy status to narcotic agent status: Secondary | ICD-10-CM

## 2022-02-09 DIAGNOSIS — I120 Hypertensive chronic kidney disease with stage 5 chronic kidney disease or end stage renal disease: Secondary | ICD-10-CM | POA: Diagnosis not present

## 2022-02-09 DIAGNOSIS — I700 Atherosclerosis of aorta: Secondary | ICD-10-CM | POA: Diagnosis not present

## 2022-02-09 DIAGNOSIS — E876 Hypokalemia: Secondary | ICD-10-CM | POA: Diagnosis present

## 2022-02-09 DIAGNOSIS — R778 Other specified abnormalities of plasma proteins: Secondary | ICD-10-CM | POA: Diagnosis present

## 2022-02-09 DIAGNOSIS — E1165 Type 2 diabetes mellitus with hyperglycemia: Secondary | ICD-10-CM | POA: Diagnosis not present

## 2022-02-09 DIAGNOSIS — R000 Tachycardia, unspecified: Secondary | ICD-10-CM | POA: Diagnosis not present

## 2022-02-09 DIAGNOSIS — I5031 Acute diastolic (congestive) heart failure: Secondary | ICD-10-CM | POA: Diagnosis present

## 2022-02-09 DIAGNOSIS — T8571XA Infection and inflammatory reaction due to peritoneal dialysis catheter, initial encounter: Secondary | ICD-10-CM | POA: Diagnosis not present

## 2022-02-09 DIAGNOSIS — Z79899 Other long term (current) drug therapy: Secondary | ICD-10-CM

## 2022-02-09 DIAGNOSIS — N2581 Secondary hyperparathyroidism of renal origin: Secondary | ICD-10-CM | POA: Diagnosis not present

## 2022-02-09 DIAGNOSIS — Z825 Family history of asthma and other chronic lower respiratory diseases: Secondary | ICD-10-CM

## 2022-02-09 DIAGNOSIS — Z8042 Family history of malignant neoplasm of prostate: Secondary | ICD-10-CM

## 2022-02-09 DIAGNOSIS — Z96653 Presence of artificial knee joint, bilateral: Secondary | ICD-10-CM | POA: Diagnosis not present

## 2022-02-09 DIAGNOSIS — N186 End stage renal disease: Secondary | ICD-10-CM

## 2022-02-09 DIAGNOSIS — F17210 Nicotine dependence, cigarettes, uncomplicated: Secondary | ICD-10-CM | POA: Diagnosis present

## 2022-02-09 DIAGNOSIS — I132 Hypertensive heart and chronic kidney disease with heart failure and with stage 5 chronic kidney disease, or end stage renal disease: Secondary | ICD-10-CM | POA: Diagnosis not present

## 2022-02-09 DIAGNOSIS — E1142 Type 2 diabetes mellitus with diabetic polyneuropathy: Secondary | ICD-10-CM | POA: Diagnosis not present

## 2022-02-09 DIAGNOSIS — E1151 Type 2 diabetes mellitus with diabetic peripheral angiopathy without gangrene: Secondary | ICD-10-CM | POA: Diagnosis present

## 2022-02-09 DIAGNOSIS — Z833 Family history of diabetes mellitus: Secondary | ICD-10-CM

## 2022-02-09 DIAGNOSIS — R0602 Shortness of breath: Secondary | ICD-10-CM | POA: Diagnosis not present

## 2022-02-09 DIAGNOSIS — Z6841 Body Mass Index (BMI) 40.0 and over, adult: Secondary | ICD-10-CM

## 2022-02-09 DIAGNOSIS — E119 Type 2 diabetes mellitus without complications: Secondary | ICD-10-CM

## 2022-02-09 DIAGNOSIS — M8580 Other specified disorders of bone density and structure, unspecified site: Secondary | ICD-10-CM | POA: Diagnosis present

## 2022-02-09 DIAGNOSIS — I4891 Unspecified atrial fibrillation: Secondary | ICD-10-CM

## 2022-02-09 DIAGNOSIS — Z8249 Family history of ischemic heart disease and other diseases of the circulatory system: Secondary | ICD-10-CM

## 2022-02-09 DIAGNOSIS — J449 Chronic obstructive pulmonary disease, unspecified: Secondary | ICD-10-CM | POA: Diagnosis not present

## 2022-02-09 DIAGNOSIS — I517 Cardiomegaly: Secondary | ICD-10-CM | POA: Diagnosis not present

## 2022-02-09 DIAGNOSIS — E872 Acidosis, unspecified: Secondary | ICD-10-CM | POA: Diagnosis not present

## 2022-02-09 DIAGNOSIS — J9601 Acute respiratory failure with hypoxia: Secondary | ICD-10-CM | POA: Diagnosis not present

## 2022-02-09 DIAGNOSIS — Z7951 Long term (current) use of inhaled steroids: Secondary | ICD-10-CM

## 2022-02-09 DIAGNOSIS — K659 Peritonitis, unspecified: Secondary | ICD-10-CM | POA: Diagnosis not present

## 2022-02-09 DIAGNOSIS — N189 Chronic kidney disease, unspecified: Secondary | ICD-10-CM

## 2022-02-09 DIAGNOSIS — Z9049 Acquired absence of other specified parts of digestive tract: Secondary | ICD-10-CM

## 2022-02-09 DIAGNOSIS — F411 Generalized anxiety disorder: Secondary | ICD-10-CM | POA: Diagnosis present

## 2022-02-09 DIAGNOSIS — I739 Peripheral vascular disease, unspecified: Secondary | ICD-10-CM | POA: Diagnosis present

## 2022-02-09 DIAGNOSIS — E11649 Type 2 diabetes mellitus with hypoglycemia without coma: Secondary | ICD-10-CM | POA: Diagnosis not present

## 2022-02-09 DIAGNOSIS — Z90710 Acquired absence of both cervix and uterus: Secondary | ICD-10-CM

## 2022-02-09 DIAGNOSIS — E871 Hypo-osmolality and hyponatremia: Secondary | ICD-10-CM | POA: Diagnosis present

## 2022-02-09 DIAGNOSIS — D649 Anemia, unspecified: Secondary | ICD-10-CM | POA: Diagnosis not present

## 2022-02-09 DIAGNOSIS — Z794 Long term (current) use of insulin: Secondary | ICD-10-CM

## 2022-02-09 DIAGNOSIS — Z880 Allergy status to penicillin: Secondary | ICD-10-CM

## 2022-02-09 DIAGNOSIS — I509 Heart failure, unspecified: Secondary | ICD-10-CM | POA: Diagnosis not present

## 2022-02-09 DIAGNOSIS — R1110 Vomiting, unspecified: Secondary | ICD-10-CM | POA: Diagnosis present

## 2022-02-09 DIAGNOSIS — Z800 Family history of malignant neoplasm of digestive organs: Secondary | ICD-10-CM

## 2022-02-09 DIAGNOSIS — J90 Pleural effusion, not elsewhere classified: Secondary | ICD-10-CM

## 2022-02-09 DIAGNOSIS — M19011 Primary osteoarthritis, right shoulder: Secondary | ICD-10-CM | POA: Diagnosis not present

## 2022-02-09 DIAGNOSIS — G252 Other specified forms of tremor: Secondary | ICD-10-CM | POA: Diagnosis present

## 2022-02-09 DIAGNOSIS — R112 Nausea with vomiting, unspecified: Secondary | ICD-10-CM | POA: Diagnosis not present

## 2022-02-09 DIAGNOSIS — R1084 Generalized abdominal pain: Secondary | ICD-10-CM | POA: Diagnosis not present

## 2022-02-09 DIAGNOSIS — Z8619 Personal history of other infectious and parasitic diseases: Secondary | ICD-10-CM

## 2022-02-09 DIAGNOSIS — Z981 Arthrodesis status: Secondary | ICD-10-CM

## 2022-02-09 LAB — TROPONIN I (HIGH SENSITIVITY)
Troponin I (High Sensitivity): 43 ng/L — ABNORMAL HIGH (ref <18)
Troponin I (High Sensitivity): 46 ng/L — ABNORMAL HIGH (ref <18)

## 2022-02-09 LAB — URINALYSIS, COMPLETE (UACMP) WITH MICROSCOPIC
Bacteria, UA: NONE SEEN
Bilirubin Urine: NEGATIVE
Glucose, UA: >=500 mg/dL — AB
Ketones, ur: NEGATIVE mg/dL
Nitrite: NEGATIVE
Protein, ur: 100 mg/dL — AB
Specific Gravity, Urine: 1.016 (ref 1.005–1.030)
pH: 6 (ref 5.0–8.0)

## 2022-02-09 LAB — COMPREHENSIVE METABOLIC PANEL
ALT: 14 U/L (ref 0–44)
AST: 30 U/L (ref 15–41)
Albumin: 2.2 g/dL — ABNORMAL LOW (ref 3.5–5.0)
Alkaline Phosphatase: 95 U/L (ref 38–126)
Anion gap: 14 (ref 5–15)
BUN: 19 mg/dL (ref 8–23)
CO2: 26 mmol/L (ref 22–32)
Calcium: 7.9 mg/dL — ABNORMAL LOW (ref 8.9–10.3)
Chloride: 92 mmol/L — ABNORMAL LOW (ref 98–111)
Creatinine, Ser: 3.61 mg/dL — ABNORMAL HIGH (ref 0.44–1.00)
GFR, Estimated: 13 mL/min — ABNORMAL LOW (ref >60)
Glucose, Bld: 537 mg/dL (ref 70–99)
Potassium: 3 mmol/L — ABNORMAL LOW (ref 3.5–5.1)
Sodium: 132 mmol/L — ABNORMAL LOW (ref 135–145)
Total Bilirubin: 1.4 mg/dL — ABNORMAL HIGH (ref 0.3–1.2)
Total Protein: 6.2 g/dL — ABNORMAL LOW (ref 6.5–8.1)

## 2022-02-09 LAB — HEPATITIS B SURFACE ANTIGEN: Hepatitis B Surface Ag: NONREACTIVE

## 2022-02-09 LAB — CBG MONITORING, ED
Glucose-Capillary: 501 mg/dL (ref 70–99)
Glucose-Capillary: 452 mg/dL — ABNORMAL HIGH (ref 70–99)

## 2022-02-09 LAB — CBC
HCT: 34 % — ABNORMAL LOW (ref 36.0–46.0)
Hemoglobin: 10.8 g/dL — ABNORMAL LOW (ref 12.0–15.0)
MCH: 30.1 pg (ref 26.0–34.0)
MCHC: 31.8 g/dL (ref 30.0–36.0)
MCV: 94.7 fL (ref 80.0–100.0)
Platelets: 184 10*3/uL (ref 150–400)
RBC: 3.59 MIL/uL — ABNORMAL LOW (ref 3.87–5.11)
RDW: 13.7 % (ref 11.5–15.5)
WBC: 8.6 10*3/uL (ref 4.0–10.5)
nRBC: 0 % (ref 0.0–0.2)

## 2022-02-09 LAB — LACTIC ACID, PLASMA
Lactic Acid, Venous: 2.7 mmol/L (ref 0.5–1.9)
Lactic Acid, Venous: 2.7 mmol/L (ref 0.5–1.9)

## 2022-02-09 LAB — LIPASE, BLOOD: Lipase: 19 U/L (ref 11–51)

## 2022-02-09 LAB — TSH: TSH: 2.824 u[IU]/mL (ref 0.350–4.500)

## 2022-02-09 LAB — BODY FLUID CELL COUNT WITH DIFFERENTIAL
Eos, Fluid: 0 %
Lymphs, Fluid: 6 %
Monocyte-Macrophage-Serous Fluid: 14 %
Neutrophil Count, Fluid: 80 %
Total Nucleated Cell Count, Fluid: 1468 cu mm

## 2022-02-09 LAB — BRAIN NATRIURETIC PEPTIDE: B Natriuretic Peptide: 621.5 pg/mL — ABNORMAL HIGH (ref 0.0–100.0)

## 2022-02-09 LAB — APTT: aPTT: 23 seconds — ABNORMAL LOW (ref 24–36)

## 2022-02-09 LAB — BASIC METABOLIC PANEL
Anion gap: 10 (ref 5–15)
BUN: 19 mg/dL (ref 8–23)
CO2: 28 mmol/L (ref 22–32)
Calcium: 7.9 mg/dL — ABNORMAL LOW (ref 8.9–10.3)
Chloride: 95 mmol/L — ABNORMAL LOW (ref 98–111)
Creatinine, Ser: 3.7 mg/dL — ABNORMAL HIGH (ref 0.44–1.00)
GFR, Estimated: 12 mL/min — ABNORMAL LOW (ref >60)
Glucose, Bld: 313 mg/dL — ABNORMAL HIGH (ref 70–99)
Potassium: 2.3 mmol/L — CL (ref 3.5–5.1)
Sodium: 133 mmol/L — ABNORMAL LOW (ref 135–145)

## 2022-02-09 LAB — RESP PANEL BY RT-PCR (FLU A&B, COVID) ARPGX2
Influenza A by PCR: NEGATIVE
Influenza B by PCR: NEGATIVE
SARS Coronavirus 2 by RT PCR: NEGATIVE

## 2022-02-09 LAB — GLUCOSE, CAPILLARY
Glucose-Capillary: 336 mg/dL — ABNORMAL HIGH (ref 70–99)
Glucose-Capillary: 306 mg/dL — ABNORMAL HIGH (ref 70–99)

## 2022-02-09 LAB — PROTIME-INR
INR: 1 (ref 0.8–1.2)
Prothrombin Time: 13.6 seconds (ref 11.4–15.2)

## 2022-02-09 LAB — MAGNESIUM: Magnesium: 1.1 mg/dL — ABNORMAL LOW (ref 1.7–2.4)

## 2022-02-09 LAB — HEMOGLOBIN A1C
Hgb A1c MFr Bld: 11.2 % — ABNORMAL HIGH (ref 4.8–5.6)
Mean Plasma Glucose: 274.74 mg/dL

## 2022-02-09 LAB — BETA-HYDROXYBUTYRIC ACID: Beta-Hydroxybutyric Acid: 0.12 mmol/L (ref 0.05–0.27)

## 2022-02-09 MED ORDER — FUROSEMIDE 10 MG/ML IJ SOLN
80.0000 mg | Freq: Every day | INTRAMUSCULAR | Status: DC
  Administered 2022-02-09 – 2022-02-12 (×4): 80 mg via INTRAVENOUS
  Filled 2022-02-09 (×4): qty 8

## 2022-02-09 MED ORDER — LACTATED RINGERS IV BOLUS
500.0000 mL | Freq: Once | INTRAVENOUS | Status: AC
  Administered 2022-02-09: 500 mL via INTRAVENOUS

## 2022-02-09 MED ORDER — PANTOPRAZOLE SODIUM 40 MG PO TBEC: 40.0000 mg | DELAYED_RELEASE_TABLET | Freq: Two times a day (BID) | ORAL | Status: DC

## 2022-02-09 MED ORDER — SODIUM CHLORIDE 0.9 % IV SOLN: 250.0000 mL | INTRAVENOUS | Status: DC | PRN

## 2022-02-09 MED ORDER — MOMETASONE FURO-FORMOTEROL FUM 200-5 MCG/ACT IN AERO
2.0000 | INHALATION_SPRAY | Freq: Two times a day (BID) | RESPIRATORY_TRACT | Status: DC
  Administered 2022-02-10 – 2022-02-14 (×9): 2 via RESPIRATORY_TRACT
  Filled 2022-02-09: qty 8.8

## 2022-02-09 MED ORDER — POTASSIUM CHLORIDE CRYS ER 20 MEQ PO TBCR
40.0000 meq | EXTENDED_RELEASE_TABLET | Freq: Once | ORAL | Status: AC
  Administered 2022-02-09: 40 meq via ORAL
  Filled 2022-02-09: qty 2

## 2022-02-09 MED ORDER — INSULIN ASPART 100 UNIT/ML IJ SOLN
0.0000 [IU] | Freq: Every day | INTRAMUSCULAR | Status: DC
  Administered 2022-02-09: 4 [IU] via SUBCUTANEOUS
  Filled 2022-02-09: qty 1

## 2022-02-09 MED ORDER — FUROSEMIDE 10 MG/ML IJ SOLN: 40.0000 mg | Freq: Once | INTRAMUSCULAR | Status: DC

## 2022-02-09 MED ORDER — MAGNESIUM SULFATE 4 GM/100ML IV SOLN
4.0000 g | Freq: Once | INTRAVENOUS | Status: AC
  Administered 2022-02-09: 4 g via INTRAVENOUS
  Filled 2022-02-09: qty 100

## 2022-02-09 MED ORDER — DILTIAZEM LOAD VIA INFUSION
10.0000 mg | Freq: Once | INTRAVENOUS | Status: AC
  Administered 2022-02-09: 10 mg via INTRAVENOUS
  Filled 2022-02-09: qty 10

## 2022-02-09 MED ORDER — POTASSIUM CHLORIDE 10 MEQ/100ML IV SOLN
10.0000 meq | INTRAVENOUS | Status: DC
  Administered 2022-02-09 (×2): 10 meq via INTRAVENOUS
  Filled 2022-02-09 (×2): qty 100

## 2022-02-09 MED ORDER — HYDROCODONE-ACETAMINOPHEN 5-325 MG PO TABS
1.0000 | ORAL_TABLET | Freq: Four times a day (QID) | ORAL | Status: DC | PRN
  Administered 2022-02-11: 1 via ORAL
  Filled 2022-02-09: qty 1

## 2022-02-09 MED ORDER — INSULIN ASPART 100 UNIT/ML IJ SOLN
0.0000 [IU] | Freq: Three times a day (TID) | INTRAMUSCULAR | Status: DC
  Administered 2022-02-10: 3 [IU] via SUBCUTANEOUS
  Administered 2022-02-11: 7 [IU] via SUBCUTANEOUS
  Filled 2022-02-09 (×3): qty 1

## 2022-02-09 MED ORDER — POTASSIUM CHLORIDE CRYS ER 20 MEQ PO TBCR
40.0000 meq | EXTENDED_RELEASE_TABLET | Freq: Once | ORAL | Status: DC
Start: 2022-02-09 — End: 2022-02-09
  Filled 2022-02-09: qty 2

## 2022-02-09 MED ORDER — HEPARIN SODIUM (PORCINE) 5000 UNIT/ML IJ SOLN
5000.0000 [IU] | Freq: Three times a day (TID) | INTRAMUSCULAR | Status: DC
  Administered 2022-02-09 – 2022-02-12 (×8): 5000 [IU] via SUBCUTANEOUS
  Filled 2022-02-09 (×8): qty 1

## 2022-02-09 MED ORDER — SODIUM CHLORIDE 0.9% FLUSH
3.0000 mL | Freq: Two times a day (BID) | INTRAVENOUS | Status: DC
  Administered 2022-02-09 – 2022-02-13 (×9): 3 mL via INTRAVENOUS

## 2022-02-09 MED ORDER — SODIUM CHLORIDE 0.9% FLUSH: 3.0000 mL | INTRAVENOUS | Status: DC | PRN

## 2022-02-09 MED ORDER — INSULIN ASPART 100 UNIT/ML IJ SOLN
0.0000 [IU] | INTRAMUSCULAR | Status: DC
  Administered 2022-02-09: 15 [IU] via SUBCUTANEOUS
  Filled 2022-02-09: qty 1

## 2022-02-09 MED ORDER — SODIUM CHLORIDE 0.9 % IV SOLN: 1.0000 g | Freq: Once | INTRAVENOUS | Status: DC

## 2022-02-09 MED ORDER — DILTIAZEM HCL-DEXTROSE 125-5 MG/125ML-% IV SOLN (PREMIX)
5.0000 mg/h | INTRAVENOUS | Status: DC
  Administered 2022-02-09: 5 mg/h via INTRAVENOUS
  Filled 2022-02-09: qty 125

## 2022-02-09 MED ORDER — AMITRIPTYLINE HCL 10 MG PO TABS: 10.0000 mg | ORAL_TABLET | Freq: Every day | ORAL | Status: DC

## 2022-02-09 MED ORDER — ALPRAZOLAM 0.5 MG PO TABS
0.5000 mg | ORAL_TABLET | Freq: Every day | ORAL | Status: DC
  Administered 2022-02-09 – 2022-02-13 (×5): 0.5 mg via ORAL
  Filled 2022-02-09 (×5): qty 1

## 2022-02-09 MED ORDER — INSULIN GLARGINE-YFGN 100 UNIT/ML ~~LOC~~ SOLN
40.0000 [IU] | Freq: Every day | SUBCUTANEOUS | Status: DC
  Administered 2022-02-09: 40 [IU] via SUBCUTANEOUS
  Filled 2022-02-09 (×2): qty 0.4

## 2022-02-09 MED ORDER — FIDAXOMICIN 200 MG PO TABS
200.0000 mg | ORAL_TABLET | Freq: Two times a day (BID) | ORAL | Status: DC
  Administered 2022-02-09 – 2022-02-10 (×2): 200 mg via ORAL
  Filled 2022-02-09 (×2): qty 1

## 2022-02-09 MED ORDER — POTASSIUM CHLORIDE 10 MEQ/100ML IV SOLN
10.0000 meq | INTRAVENOUS | Status: AC
  Administered 2022-02-09 – 2022-02-10 (×2): 10 meq via INTRAVENOUS
  Filled 2022-02-09 (×2): qty 100

## 2022-02-09 MED ORDER — ATORVASTATIN CALCIUM 20 MG PO TABS: 30.0000 mg | ORAL_TABLET | Freq: Every day | ORAL | Status: DC

## 2022-02-09 NOTE — Progress Notes (Signed)
Review of chart suggests patient had peritoneal fluid culture positive for Enterococcus faecalis on January 14, 2022.  Per nursing report, patient has cloudy fluid. ?Recommend admission and treatment with IV broad-spectrum gram-positive and gram-negative coverage until organism is identified. ?Recommend ID opinion regarding peritonitis ?Fluid Gram stain and culture has been sent. ? ?

## 2022-02-09 NOTE — ED Provider Notes (Signed)
? ?[facility] ?Provider Note ? ? ? Event Date/Time  ? First MD Initiated Contact with Patient 02/09/22 1300   ?  (approximate) ? ? ?History  ? ?Shortness of Breath and Hyperglycemia ? ? ?HPI ? ?Andrea Stewart is a 76 y.o. female with a past medical history of COPD, arthritis, HDL, thyroid disorder, resting tremor, IBS, CAD, ESRD on peritoneal dialysis, anemia, HTN, chronic hyponatremia and right hip and shoulder fractures last year as well as recent peritonitis last month and C. difficile before that who presents for evaluation of 3 days of abdominal pain associate with nonbloody nonbilious vomiting, diarrhea, chest tightness, shortness of breath and malaise.  No fevers, hemoptysis, blood in her stool or vomit, change in her small urine output or change in her dialysis output.  No bloody or purulent output.  No back pain, earache, sore throat, rash or falls or injuries.  Patient states she has a history of A-fib but does not take anything to treat the rate and is not on a blood thinner.  No recent EtOH use illicit drug or tobacco abuse.  No other acute concerns at this time.  She states has been compliant with her peritoneal dialysis.  She also notes her glucoses have been running very high and her Dexcom at home although she has been taking her insulin. ? ?  ? ? ?Physical Exam  ?Triage Vital Signs: ?ED Triage Vitals  ?Enc Vitals Group  ?   BP   ?   Pulse   ?   Resp   ?   Temp   ?   Temp src   ?   SpO2   ?   Weight   ?   Height   ?   Head Circumference   ?   Peak Flow   ?   Pain Score   ?   Pain Loc   ?   Pain Edu?   ?   Excl. in [place]?   ? ? ?Most recent vital signs: ?Vitals:  ? 02/09/22 1445 02/09/22 1519  ?BP:  140/80  ?Pulse: (!) 120 (!) 117  ?Resp: (!) 34 (!) 27  ?Temp:    ?SpO2: 97% 97%  ? ? ?General: Awake, appears uncomfortable. ?CV:  Slightly prolonged capillary fill in the digits.  Tachycardic.  Irregular.  No significant murmur. ?Resp:  Normal effort.  Tachypneic but clear  bilaterally. ?Abd:  No distention.  Mildly tender throughout. ?Other:  Alert and oriented. ? ? ?ED Results / Procedures / Treatments  ?Labs ?(all labs ordered are listed, but only abnormal results are displayed) ?Labs Reviewed  ?CBC - Abnormal; Notable for the following components:  ?    Result Value  ? RBC 3.59 (*)   ? Hemoglobin 10.8 (*)   ? HCT 34.0 (*)   ? All other components within normal limits  ?LACTIC ACID, PLASMA - Abnormal; Notable for the following components:  ? Lactic Acid, Venous 2.7 (*)   ? All other components within normal limits  ?COMPREHENSIVE METABOLIC PANEL - Abnormal; Notable for the following components:  ? Sodium 132 (*)   ? Potassium 3.0 (*)   ? Chloride 92 (*)   ? Glucose, Bld 537 (*)   ? Creatinine, Ser 3.61 (*)   ? Calcium 7.9 (*)   ? Total Protein 6.2 (*)   ? Albumin 2.2 (*)   ? Total Bilirubin 1.4 (*)   ? GFR, Estimated 13 (*)   ? All other components within  normal limits  ?APTT - Abnormal; Notable for the following components:  ? aPTT 23 (*)   ? All other components within normal limits  ?URINALYSIS, COMPLETE (UACMP) WITH MICROSCOPIC - Abnormal; Notable for the following components:  ? Color, Urine YELLOW (*)   ? APPearance CLEAR (*)   ? Glucose, UA >=500 (*)   ? Hgb urine dipstick MODERATE (*)   ? Protein, ur 100 (*)   ? Leukocytes,Ua TRACE (*)   ? All other components within normal limits  ?BRAIN NATRIURETIC PEPTIDE - Abnormal; Notable for the following components:  ? B Natriuretic Peptide 621.5 (*)   ? All other components within normal limits  ?BLOOD GAS, VENOUS - Abnormal; Notable for the following components:  ? Bicarbonate 32.7 (*)   ? Acid-Base Excess 6.2 (*)   ? All other components within normal limits  ?MAGNESIUM - Abnormal; Notable for the following components:  ? Magnesium 1.1 (*)   ? All other components within normal limits  ?CBG MONITORING, ED - Abnormal; Notable for the following components:  ? Glucose-Capillary 501 (*)   ? All other components within normal limits   ?TROPONIN I (HIGH SENSITIVITY) - Abnormal; Notable for the following components:  ? Troponin I (High Sensitivity) 43 (*)   ? All other components within normal limits  ?RESP PANEL BY RT-PCR (FLU A&B, COVID) [id number]  ?CULTURE, BLOOD (ROUTINE X 2)  ?CULTURE, BLOOD (ROUTINE X 2)  ?URINE CULTURE  ?BODY FLUID CULTURE W GRAM STAIN  ?C DIFFICILE QUICK SCREEN W PCR REFLEX    ?GASTROINTESTINAL PANEL BY PCR, STOOL (REPLACES STOOL CULTURE)  ?PROTIME-INR  ?BETA-HYDROXYBUTYRIC ACID  ?LIPASE, BLOOD  ?LACTIC ACID, PLASMA  ?BODY FLUID CELL COUNT WITH DIFFERENTIAL  ?HEMOGLOBIN A1C  ?TROPONIN I (HIGH SENSITIVITY)  ? ? ? ?EKG ? ?ECG is remarkable for A-fib with a rate of 133, QTc interval 500, normal axis and some nonspecific ST changes in inferior leads without other clear evidence of acute ischemia. ? ? ?RADIOLOGY ? ?CT chest abdomen pelvis on my interpretation without evidence of pneumothorax, focal consolidation or overt edema or other some small bilateral pleural effusions of the thorax.  I do not see evidence of diverticulitis, pancreatitis or intra-abdominal abscess.  I also reviewed radiology's findings and discussed with reading radiologist radiologist of rotation of trace intraperitoneal free fluid and pneumoperitoneum likely from indwelling peritoneal dialysis catheter as well as trace bilateral effusions and a stable 2.8 cm right paratracheal soft tissues nodule as well as aortic atherosclerosis. ? ? ?PROCEDURES: ? ?Critical Care performed: Yes, see critical care procedure note(s) ? ?.Critical Care ?Performed by: Lucrezia Starch, MD ?Authorized by: Lucrezia Starch, MD  ? ?Critical care provider statement:  ?  Critical care time (minutes):  30 ?  Critical care was necessary to treat or prevent imminent or life-threatening deterioration of the following conditions:  Cardiac failure (RVR) ?  Critical care was time spent personally by me on the following activities:  Development of treatment plan with patient or surrogate,  discussions with consultants, evaluation of patient's response to treatment, examination of patient, ordering and review of laboratory studies, ordering and review of radiographic studies, ordering and performing treatments and interventions, pulse oximetry, re-evaluation of patient's condition and review of old charts ?.1-3 Lead EKG Interpretation ?Performed by: Lucrezia Starch, MD ?Authorized by: Lucrezia Starch, MD  ? ?  Interpretation: non-specific   ?  ECG rate assessment: tachycardic   ?  Rhythm: atrial fibrillation   ?  Ectopy: none   ?  Conduction: normal   ? ?The patient is on the cardiac monitor to evaluate for evidence of arrhythmia and/or significant heart rate changes. ? ? ?MEDICATIONS ORDERED IN ED: ?Medications  ?insulin aspart (novoLOG) injection 0-15 Units (15 Units Subcutaneous Given 02/09/22 1520)  ?potassium chloride SA (KLOR-CON M) CR tablet 40 mEq (40 mEq Oral Not Given 02/09/22 1514)  ?potassium chloride 10 mEq in 100 mL IVPB (10 mEq Intravenous New Bag/Given 02/09/22 1518)  ?diltiazem (CARDIZEM) 1 mg/mL load via infusion 10 mg (10 mg Intravenous Bolus from Bag 02/09/22 1520)  ?  And  ?diltiazem (CARDIZEM) 125 mg in dextrose 5% 125 mL (1 mg/mL) infusion (5 mg/hr Intravenous New Bag/Given 02/09/22 1516)  ?magnesium sulfate IVPB 4 g 100 mL (has no administration in time range)  ?lactated ringers bolus 500 mL (0 mLs Intravenous Stopped 02/09/22 1417)  ? ? ? ?IMPRESSION / MDM / ASSESSMENT AND PLAN / ED COURSE  ?I reviewed the triage vital signs and the nursing notes. ?             ?               ? ?Differential diagnosis includes, but is not limited to pneumonia, arrhythmia, sepsis possibly from recurrent peritonitis or other abdominal source, infectious gastroenteritis causing dehydration, electrolyte derangements, anemia, as well as metabolic derangements such as HHS or DKA. ? ?CMP is remarkable for K of 3, glucose of 537 and albumin 2.2 without other significant electrolyte or metabolic  derangements.  Kidney function is consistent history of ESRD.  No evidence of acidosis as bicarb is 26 and anion gap is 14.  CBC without leukocytosis and hemoglobin stable at 10.8 compared to 11.14 months ago.  BNP slightl

## 2022-02-09 NOTE — Progress Notes (Addendum)
02/09/2022 ? ?PD fluid collected ? ?Appearance is cloudy, yellow and has fibrin. Dr Candiss Norse was notified.  ? ?Sample sent to the Lab ?

## 2022-02-09 NOTE — H&P (Signed)
?History and Physical  ? ? ?ARLEN LEGENDRE IOE:[id number] DOB: 04-28-1946 DOA: 02/09/2022 ? ?PCP: Rusty Aus, MD  ?Patient coming from: home ? ? ?Chief Complaint: abdominal pain, vomiting, diarrhea ? ?HPI: Andrea Stewart is a 76 y.o. female with medical history significant for morbid obesity, esrd on peritoneal dialysis, PAD, COPD, who presents with the above. ? ?She says she was treated for c diff colitis last month and for peritoneal fluid infection earlier this month, both by her nephrologist. She says she has developed diffuse abdominal pain for the past three days along with dyspnea, vomiting, and non-bloody diarrhea. No fevers but does report chills. No chest pain.  ? ?ED Course:  ? ?Found to be in a fib with rvr. Started on diltiazim gtt after bolus failed to improve HR. Labs showed severe hyperglycemia, hypokalemia, elevated bnp and lactate. Nephrology consulted.  ? ?Review of Systems: As per HPI otherwise 10 point review of systems negative.  ? ? ?Past Medical History:  ?Diagnosis Date  ? Backache, unspecified   ? s/p c-spine and l-spine fusions  ? COPD (chronic obstructive pulmonary disease) ([place])   ? Degeneration of intervertebral disc, site unspecified   ? Depressive disorder, not elsewhere classified   ? Disorder of bone and cartilage, unspecified   ? Family history of adverse reaction to anesthesia   ? mother did, but patient not sure of the complication  ? HTN (hypertension)   ? Hx of left heart catheterization by cutdown   ? a. 7 years ago; no significant cad; b. Lexiscan 2014: no significant ischemia, no significant EKG changes concerning for ischemia, EF 67%, overall low risk study  ? Kidney failure   ? Dr Cyndia Diver  ? Myalgia and myositis, unspecified   ? Obesity, unspecified   ? Personal history of tobacco use, presenting hazards to health   ? 1/2 ppd  ? Plantar fascial fibromatosis   ? S/P cholecystectomy   ? S/p nephrectomy   ? Type II or unspecified type diabetes mellitus without mention  of complication, not stated as uncontrolled   ? Unspecified essential hypertension   ? 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 ? ?Past Surgical History:  ?Procedure Laterality Date  ? ABDOMINAL HYSTERECTOMY    ? APPENDECTOMY    ? BACK SURGERY    ? 1966 and 2006   ? CARDIAC CATHETERIZATION    ? o.k. 2003  ? CARPAL TUNNEL RELEASE    ? CERVICAL DISCECTOMY    ? CHOLECYSTECTOMY    ? COLONOSCOPY  09/1999  ? colonoscopy-hemorrhoids, re-check 5 years (10/2006)  ? dexa    ? osteopenia (01/2004)  ? DIALYSIS/PERMA CATHETER INSERTION Right 01/05/2021  ? Procedure: DIALYSIS/PERMA CATHETER INSERTION;  Surgeon: Algernon Huxley, MD;  Location: [place] [facility];  Service: Cardiovascular;  Laterality: Right;  ? DIALYSIS/PERMA CATHETER REMOVAL N/A 02/05/2021  ? Procedure: DIALYSIS/PERMA CATHETER REMOVAL;  Surgeon: Algernon Huxley, MD;  Location: [place] [facility];  Service: Cardiovascular;  Laterality: N/A;  ? ELBOW SURGERY    ? ESOPHAGOGASTRODUODENOSCOPY (EGD) WITH PROPOFOL N/A 07/23/2020  ? Procedure: ESOPHAGOGASTRODUODENOSCOPY (EGD) WITH PROPOFOL;  Surgeon: Robert Bellow, MD;  Location: [facility];  Service: Endoscopy;  Laterality: N/A;  TRAVEL TO O.R. - PATIENT TO HAVE SURGERY  ? FEMUR IM NAIL Left 01/01/2021  ? Procedure: INTRAMEDULLARY (IM) NAIL FEMORAL;  Surgeon: Lovell Sheehan, MD;  Location: [facility];  Service: Orthopedics;  Laterality: Left;  ? FOOT SURGERY    ?  x 3  ? HIP FRACTURE SURGERY Left   ? JOINT REPLACEMENT    ? KIDNEY DONATION  1991  ? LAPAROSCOPIC ABDOMINAL EXPLORATION N/A 07/23/2020  ? Procedure: LAPAROSCOPIC ABDOMINAL EXPLORATION;  Surgeon: Robert Bellow, MD;  Location: [facility];  Service: General;  Laterality: N/A;  ? LUMBAR FUSION    ? NECK SURGERY    ? 06/2005  ? Problem with general Anesthesia    ? 02/2006  ? REPLACEMENT TOTAL KNEE  10/2004  ? trigger finger surgery    ? UPPER GI ENDOSCOPY N/A 07/23/2020  ? Procedure: UPPER GI ENDOSCOPY;   Surgeon: Robert Bellow, MD;  Location: [facility];  Service: General;  Laterality: N/A;  ? VENTRAL HERNIA REPAIR N/A 09/08/2017  ? Primary repair of a 10 x 15 mm infraumbilical fascial defect.  Surgeon: Robert Bellow, MD;  Location: [facility];  Service: General;  Laterality: N/A;  ? VESICOVAGINAL FISTULA CLOSURE W/ TAH    ? VIDEO BRONCHOSCOPY Bilateral 04/02/2015  ? Procedure: VIDEO BRONCHOSCOPY WITHOUT FLUORO;  Surgeon: Juanito Doom, MD;  Location: [facility];  Service: Cardiopulmonary;  Laterality: Bilateral;  ? ? ? reports that she has been smoking cigarettes. She has a 7.50 pack-year smoking history. She has never used smokeless tobacco. She reports that she does not drink alcohol and does not use drugs. ? ?Allergies  ?Allergen Reactions  ? Morphine Anaphylaxis  ? Prednisone Other (See Comments)  ?  "makes me think I am having a heart attack"  ? Gabapentin Other (See Comments)  ?  intolerance  ? Hydralazine Itching  ? Nsaids Diarrhea  ? Penicillins Swelling  ?  TOLERATED CEFAZOLIN 07/2020 ?Has patient had a PCN reaction causing immediate rash, facial/tongue/throat swelling, SOB or lightheadedness with hypotension: Yes ?Has patient had a PCN reaction causing severe rash involving mucus membranes or skin necrosis: No ?Has patient had a PCN reaction that required hospitalization: No ?Has patient had a PCN reaction occurring within the last 10 years: No ?If all of the above answers are "NO", then may proceed with Cephalosporin use. ?  ? ? ?Family History  ?Problem Relation Age of Onset  ? Hypertension Mother   ? Colon cancer Mother   ? Cancer Mother   ?     colon  ? Coronary artery disease Mother   ? Diabetes Mother   ? Prostate cancer Father   ? COPD Father   ? Cancer Father   ?     prostate  ? Heart attack Brother 75  ? Sleep apnea Brother   ? Diabetes Brother   ? Diabetes Paternal Aunt   ? Diabetes Paternal Uncle   ? Cancer Daughter   ?     Brain, lungs, liver, spine, kidney  ? ? ?Prior to Admission  medications   ?Medication Sig Start Date End Date Taking? Authorizing Provider  ?acetaminophen (TYLENOL) 325 MG tablet Take 2 tablets (650 mg total) by mouth every 6 (six) hours as needed for mild pain or headache (headache). 01/09/21   Ezekiel Slocumb, DO  ?ALPRAZolam (XANAX) 1 MG tablet Take 0.5 tablets (0.5 mg total) by mouth at bedtime. 10/16/21   Bary Leriche, PA-C  ?amitriptyline (ELAVIL) 10 MG tablet Take 1 tablet (10 mg total) by mouth at bedtime. 10/16/21   Bary Leriche, PA-C  ?aspirin EC 81 MG EC tablet Take 1 tablet (81 mg total) by mouth 2 (two) times daily. Swallow whole. 01/09/21   Ezekiel Slocumb, DO  ?atorvastatin (LIPITOR)  10 MG tablet Take 3 tablets (30 mg total) by mouth daily. 10/16/21   Love, Ivan Anchors, PA-C  ?budesonide-formoterol (SYMBICORT) 160-4.5 MCG/ACT inhaler Inhale 2 puffs into the lungs daily. 11/26/14   [provider]  ?Continuous Blood Gluc Receiver (DEXCOM G6 RECEIVER) [place] See admin instructions. 03/08/19   [provider]  ?furosemide (LASIX) 80 MG tablet Take 1 tablet (80 mg total) by mouth daily. 08/19/21   Minna Merritts, MD  ?gentamicin cream (GARAMYCIN) 0.1 % Apply 1 application topically daily. 10/17/21   Bary Leriche, PA-C  ?HYDROcodone-acetaminophen (NORCO/VICODIN) 5-325 MG tablet Take 1 tablet by mouth every 6 (six) hours as needed for severe pain. 10/16/21   Bary Leriche, PA-C  ?hydrOXYzine (ATARAX/VISTARIL) 25 MG tablet Take 1 tablet (25 mg total) by mouth 2 (two) times daily as needed for anxiety. 01/09/21   Nicole Kindred A, DO  ?insulin NPH Human (NOVOLIN N) 100 UNIT/ML injection Inject 0.16 mLs (16 Units total) into the skin daily before breakfast AND 0.28 mLs (28 Units total) at bedtime. 10/16/21   Love, Ivan Anchors, PA-C  ?insulin regular human CONCENTRATED (HUMULIN R U-500 KWIKPEN) 500 UNIT/ML KwikPen Inject into the skin as needed. 05/02/20   [provider]  ?magnesium oxide (MAG-OX) 400 (240 Mg) MG tablet Take 2 tablets (800 mg  total) by mouth 2 (two) times daily. 10/16/21   Love, Ivan Anchors, PA-C  ?melatonin 3 MG TABS tablet Take 1 tablet (3 mg total) by mouth at bedtime. 10/16/21   Bary Leriche, PA-C  ?metoprolol succinate (TOPROL-X

## 2022-02-09 NOTE — ED Triage Notes (Signed)
Patient to ER via [facility] from home with complaints of abdominal pain and shortness of breath with movement that has progressed over 3 days.  ? ?Patient recently diagnosed with peritonitis (4 weeks ago). ? ?Patient also does at home peritoneal dialysis (2 years), reports she has a home health nurse, who states that patient has had increased output through the port over the last several weeks. ? ?Patient with an over 20lbs weight gain over the last month. Reports that the swelling is diffuse, reports that her face [place] usually swollen. No abnormal bilateral leg swelling. ? ?Patient's cbg with EMS read HI, (>600). Patient states her dexacom has also been reading high for over 3 days. ? ?Rales present bilaterally. RA sats 98%, HR 120-130 afib.  ? ? ?

## 2022-02-09 NOTE — ED Provider Notes (Signed)
Patient received in signout from Dr. Hulan Saas pending PD fluid retrieval from the nephrology team, CT abdomen/pelvis and remainder of blood work.  CT with trace intraperitoneal fluid and no clear acute features.  I had a CXR that is congested.  She has evidence of volume overload and new onset CHF.  I see no echoes since 2016 where she had grade 1 diastolic dysfunction.  We will initiate gentle diuresis in the ED.  She remains on a diltiazem drip and electrolytes are being repleted.  Nephrology has seen the patient and drawn the fluid from her PD catheter, which was cloudy in nature, and they are recommending broad-spectrum antibiotics.  I consult with hospitalist who agrees to admit.  Hospitalist quickly consults with ID, who recommends starting ampicillin as we await cultures.  We will start this in the ED. Admitted to hospitalist.. ? ?.1-3 Lead EKG Interpretation ?Performed by: Vladimir Crofts, MD ?Authorized by: Vladimir Crofts, MD  ? ?  Interpretation: abnormal   ?  ECG rate:  111 ?  ECG rate assessment: tachycardic   ?  Rhythm: atrial fibrillation   ?  Ectopy: none   ?  Conduction: normal   ?.Critical Care ?Performed by: Vladimir Crofts, MD ?Authorized by: Vladimir Crofts, MD  ? ?Critical care provider statement:  ?  Critical care time (minutes):  30 ?  Critical care time was exclusive of:  Separately billable procedures and treating other patients ?  Critical care was necessary to treat or prevent imminent or life-threatening deterioration of the following conditions:  Cardiac failure and circulatory failure ?  Critical care was time spent personally by me on the following activities:  Development of treatment plan with patient or surrogate, discussions with consultants, evaluation of patient's response to treatment, examination of patient, ordering and review of laboratory studies, ordering and review of radiographic studies, ordering and performing treatments and interventions, pulse oximetry, re-evaluation of  patient's condition and review of old charts ? ?  ?Vladimir Crofts, MD ?02/09/22 1711 ? ?

## 2022-02-10 DIAGNOSIS — Z992 Dependence on renal dialysis: Secondary | ICD-10-CM

## 2022-02-10 DIAGNOSIS — R197 Diarrhea, unspecified: Secondary | ICD-10-CM

## 2022-02-10 DIAGNOSIS — N186 End stage renal disease: Secondary | ICD-10-CM

## 2022-02-10 DIAGNOSIS — I4891 Unspecified atrial fibrillation: Secondary | ICD-10-CM | POA: Diagnosis not present

## 2022-02-10 LAB — CBC
HCT: 29.3 % — ABNORMAL LOW (ref 36.0–46.0)
Hemoglobin: 9.5 g/dL — ABNORMAL LOW (ref 12.0–15.0)
MCH: 30.4 pg (ref 26.0–34.0)
MCHC: 32.4 g/dL (ref 30.0–36.0)
MCV: 93.6 fL (ref 80.0–100.0)
Platelets: 261 10*3/uL (ref 150–400)
RBC: 3.13 MIL/uL — ABNORMAL LOW (ref 3.87–5.11)
RDW: 13.6 % (ref 11.5–15.5)
WBC: 9.4 10*3/uL (ref 4.0–10.5)
nRBC: 0 % (ref 0.0–0.2)

## 2022-02-10 LAB — GASTROINTESTINAL PANEL BY PCR, STOOL (REPLACES STOOL CULTURE)

## 2022-02-10 LAB — C DIFFICILE QUICK SCREEN W PCR REFLEX
C Diff antigen: NEGATIVE
C Diff interpretation: NOT DETECTED
C Diff toxin: NEGATIVE

## 2022-02-10 LAB — COMPREHENSIVE METABOLIC PANEL
ALT: 13 U/L (ref 0–44)
AST: 16 U/L (ref 15–41)
Albumin: 1.8 g/dL — ABNORMAL LOW (ref 3.5–5.0)
Alkaline Phosphatase: 76 U/L (ref 38–126)
Anion gap: 8 (ref 5–15)
BUN: 20 mg/dL (ref 8–23)
CO2: 29 mmol/L (ref 22–32)
Calcium: 7.9 mg/dL — ABNORMAL LOW (ref 8.9–10.3)
Chloride: 98 mmol/L (ref 98–111)
Creatinine, Ser: 3.82 mg/dL — ABNORMAL HIGH (ref 0.44–1.00)
GFR, Estimated: 12 mL/min — ABNORMAL LOW (ref >60)
Glucose, Bld: 176 mg/dL — ABNORMAL HIGH (ref 70–99)
Potassium: 2.5 mmol/L — CL (ref 3.5–5.1)
Sodium: 135 mmol/L (ref 135–145)
Total Bilirubin: 0.6 mg/dL (ref 0.3–1.2)
Total Protein: 5.4 g/dL — ABNORMAL LOW (ref 6.5–8.1)

## 2022-02-10 LAB — ECHOCARDIOGRAM COMPLETE
Area-P 1/2: 2.62 cm2
Height: 59 in
S' Lateral: 2.9 cm

## 2022-02-10 LAB — PATHOLOGIST SMEAR REVIEW

## 2022-02-10 LAB — GLUCOSE, CAPILLARY
Glucose-Capillary: 80 mg/dL (ref 70–99)
Glucose-Capillary: 135 mg/dL — ABNORMAL HIGH (ref 70–99)
Glucose-Capillary: 64 mg/dL — ABNORMAL LOW (ref 70–99)
Glucose-Capillary: 175 mg/dL — ABNORMAL HIGH (ref 70–99)

## 2022-02-10 LAB — LACTIC ACID, PLASMA: Lactic Acid, Venous: 1.1 mmol/L (ref 0.5–1.9)

## 2022-02-10 LAB — HEPATITIS B CORE ANTIBODY, TOTAL: Hep B Core Total Ab: NONREACTIVE

## 2022-02-10 LAB — MAGNESIUM: Magnesium: 2.2 mg/dL (ref 1.7–2.4)

## 2022-02-10 MED ORDER — DELFLEX-LC/2.5% DEXTROSE 394 MOSM/L IP SOLN
INTRAPERITONEAL | Status: DC
  Filled 2022-02-10 (×5): qty 3000

## 2022-02-10 MED ORDER — INSULIN GLARGINE-YFGN 100 UNIT/ML ~~LOC~~ SOLN
35.0000 [IU] | Freq: Every day | SUBCUTANEOUS | Status: DC
  Administered 2022-02-10: 35 [IU] via SUBCUTANEOUS
  Filled 2022-02-10: qty 0.35

## 2022-02-10 MED ORDER — GENTAMICIN SULFATE 0.1 % EX CREA
1.0000 "application " | TOPICAL_CREAM | Freq: Every day | CUTANEOUS | Status: DC
  Administered 2022-02-10 – 2022-02-14 (×4): 1 via TOPICAL
  Filled 2022-02-10 (×2): qty 15

## 2022-02-10 MED ORDER — POTASSIUM CHLORIDE CRYS ER 20 MEQ PO TBCR
40.0000 meq | EXTENDED_RELEASE_TABLET | Freq: Once | ORAL | Status: AC
  Administered 2022-02-10: 40 meq via ORAL
  Filled 2022-02-10: qty 2

## 2022-02-10 MED ORDER — POTASSIUM CHLORIDE 10 MEQ/100ML IV SOLN
10.0000 meq | INTRAVENOUS | Status: AC
  Administered 2022-02-10 (×4): 10 meq via INTRAVENOUS
  Filled 2022-02-10 (×4): qty 100

## 2022-02-10 MED ORDER — CALCIUM POLYCARBOPHIL 625 MG PO TABS
625.0000 mg | ORAL_TABLET | Freq: Every day | ORAL | Status: DC
  Administered 2022-02-11 – 2022-02-14 (×4): 625 mg via ORAL
  Filled 2022-02-10 (×5): qty 1

## 2022-02-10 NOTE — Progress Notes (Signed)
Critical Potassium 2.3 reported to NP. Lab collected after a little over 1 K+ rider was infused.  ?

## 2022-02-10 NOTE — Consult Note (Addendum)
NAME: Andrea Stewart  ?DOB: January 04, 1946  ?MRN: [id number]  ?Date/Time: 02/10/2022 11:14 AM ? ?REQUESTING PROVIDER: Dr.Wouk ?Subjective:  ?REASON FOR CONSULT: Cdiff? ?? ?Andrea Stewart is a 76 y.o. with a history ESRD on PD- recently treated for enterococcus peritonitis with vancomycin, diarrhea COPD, CAD . B/l TKA, donor nephrectomy presented from home with abdominal pain, vomiting, diarrhea chest tightness shortness of breath and malaise of 3 days duration.  It looks like patient has had diarrhea for at least 2 weeks now. ?Patient was recently treated for Enterococcus faecalis bacterial peritonitis secondary to PD catheter.  Received 2 weeks of vancomycin. ?She has a past history of CAD but the recent test which was done on 01/29/2022 by the GI was negative. ?In the ED BP 140/80, pulse 117, respiratory rate 27, temperature 98.8.  WBC was 8.6, Hb 10.8, platelet 184 and creatinine 3.70.  Potassium 3.  Blood glucose was 537.  Lactate was 2.7.  CT abdomen chest and pelvis revealed stable soft tissue nodule in the right paratracheal region of 2.8 and 1.8 cm.  Stable since 2016.  Trace bilateral pleural effusion.  Left nephrectomy.  No bowel obstruction or ileus.  Mild colonic wall thickening nonspecific.  And peritoneal dialysis catheter coiled within the lower central pelvis.  Punctate pneumoperitoneum within the upper abdomen consistent with indwelling catheter.  I am asked to see the patient for possible infection. ?C. difficile has been sent.  Patient has been started empirically on fidaxomicin.   ?Past Medical History:  ?Diagnosis Date  ? Backache, unspecified   ? s/p c-spine and l-spine fusions  ? COPD (chronic obstructive pulmonary disease) ([place])   ? Degeneration of intervertebral disc, site unspecified   ? Depressive disorder, not elsewhere classified   ? Disorder of bone and cartilage, unspecified   ? Family history of adverse reaction to anesthesia   ? mother did, but patient not sure of the complication  ?  HTN (hypertension)   ? Hx of left heart catheterization by cutdown   ? a. 7 years ago; no significant cad; b. Lexiscan 2014: no significant ischemia, no significant EKG changes concerning for ischemia, EF 67%, overall low risk study  ? Kidney failure   ? Dr Cyndia Diver  ? Myalgia and myositis, unspecified   ? Obesity, unspecified   ? Personal history of tobacco use, presenting hazards to health   ? 1/2 ppd  ? Plantar fascial fibromatosis   ? S/P cholecystectomy   ? S/p nephrectomy   ? Type II or unspecified type diabetes mellitus without mention of complication, not stated as uncontrolled   ? Unspecified essential hypertension   ? 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  ?Past Surgical History:  ?Procedure Laterality Date  ? ABDOMINAL HYSTERECTOMY    ? APPENDECTOMY    ? BACK SURGERY    ? 1966 and 2006   ? CARDIAC CATHETERIZATION    ? o.k. 2003  ? CARPAL TUNNEL RELEASE    ? CERVICAL DISCECTOMY    ? CHOLECYSTECTOMY    ? COLONOSCOPY  09/1999  ? colonoscopy-hemorrhoids, re-check 5 years (10/2006)  ? dexa    ? osteopenia (01/2004)  ? DIALYSIS/PERMA CATHETER INSERTION Right 01/05/2021  ? Procedure: DIALYSIS/PERMA CATHETER INSERTION;  Surgeon: Algernon Huxley, MD;  Location: [facility];  Service: Cardiovascular;  Laterality: Right;  ? DIALYSIS/PERMA CATHETER REMOVAL N/A 02/05/2021  ? Procedure: DIALYSIS/PERMA CATHETER REMOVAL;  Surgeon: Algernon Huxley, MD;  Location: [facility];  Service: Cardiovascular;  Laterality: N/A;  ? ELBOW SURGERY    ? ESOPHAGOGASTRODUODENOSCOPY (EGD) WITH PROPOFOL N/A 07/23/2020  ? Procedure: ESOPHAGOGASTRODUODENOSCOPY (EGD) WITH PROPOFOL;  Surgeon: Robert Bellow, MD;  Location: [facility];  Service: Endoscopy;  Laterality: N/A;  TRAVEL TO O.R. - PATIENT TO HAVE SURGERY  ? FEMUR IM NAIL Left 01/01/2021  ? Procedure: INTRAMEDULLARY (IM) NAIL FEMORAL;  Surgeon: Lovell Sheehan, MD;  Location: [facility];  Service: Orthopedics;   Laterality: Left;  ? FOOT SURGERY    ? x 3  ? HIP FRACTURE SURGERY Left   ? JOINT REPLACEMENT    ? KIDNEY DONATION  1991  ? LAPAROSCOPIC ABDOMINAL EXPLORATION N/A 07/23/2020  ? Procedure: LAPAROSCOPIC ABDOMINAL EXPLORATION;  Surgeon: Robert Bellow, MD;  Location: [facility];  Service: General;  Laterality: N/A;  ? LUMBAR FUSION    ? NECK SURGERY    ? 06/2005  ? Problem with general Anesthesia    ? 02/2006  ? REPLACEMENT TOTAL KNEE  10/2004  ? trigger finger surgery    ? UPPER GI ENDOSCOPY N/A 07/23/2020  ? Procedure: UPPER GI ENDOSCOPY;  Surgeon: Robert Bellow, MD;  Location: [facility] OR[facility];  Service: General;  Laterality: N/A;  ? VENTRAL HERNIA REPAIR N/A 09/08/2017  ? Primary repair of a 10 x 15 mm infraumbilical fascial defect.  Surgeon: Robert Bellow, MD;  Location: [facility];  Service: General;  Laterality: N/A;  ? VESICOVAGINAL FISTULA CLOSURE W/ TAH    ? VIDEO BRONCHOSCOPY Bilateral 04/02/2015  ? 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 ?Socioeconomic History  ? Marital status: Married  ?  Spouse name: Not on file  ? Number of children: 4  ? Years of education: Not on file  ? Highest education level: Not on file  ?Occupational History  ? Occupation: Retired  ?Tobacco Use  ? Smoking status: Every Day  ?  Packs/day: 0.25  ?  Years: 30.00  ?  Pack years: 7.50  ?  Types: Cigarettes  ? Smokeless tobacco: Never  ? Tobacco comments:  ?  currently smoking .25 ppd, trying to quit 02/13/16  ?Substance and Sexual Activity  ? Alcohol use: No  ?  Alcohol/week: 0.0 standard drinks  ? Drug use: No  ? Sexual activity: Never  ?Other Topics Concern  ? Not on file  ?Social History Narrative  ? Lives in [place]. Disability because of back  ? ?Social Determinants of Health  ? ?Financial Resource Strain: Not on file  ?Food Insecurity: Not on file   ?Transportation Needs: Not on file  ?Physical Activity: Not on file  ?Stress: Not on file  ?Social Connections: Not on file  ?Intimate Partner Violence: Not on file  ?  ?Family History  ?Problem Relation Age of Onset  ? Hypertension Mother   ? Colon cancer Mother   ? Cancer Mother   ?     colon  ? Coronary artery disease Mother   ? Diabetes Mother   ? Prostate cancer Father   ? COPD Father   ? Cancer Father   ?     prostate  ? Heart attack Brother 72  ? Sleep apnea Brother   ? Diabetes Brother   ? Diabetes Paternal Aunt   ? Diabetes Paternal Uncle   ? Cancer Daughter   ?  Brain, lungs, liver, spine, kidney  ? ?Allergies  ?Allergen Reactions  ? Gabapentin Other (See Comments)  ?  intolerance ?intolerance ?"makes me crazy" ?"makes me crazy" ?  ? Morphine Anaphylaxis  ? Penicillins Swelling and Other (See Comments)  ?  TOLERATED CEFAZOLIN 07/2020 ?Has patient had a PCN reaction causing immediate rash, facial/tongue/throat swelling, SOB or lightheadedness with hypotension: Yes ?Has patient had a PCN reaction causing severe rash involving mucus membranes or skin necrosis: No ?Has patient had a PCN reaction that required hospitalization: No ?Has patient had a PCN reaction occurring within the last 10 years: No ?If all of the above answers are "NO", then may proceed with Cephalosporin use. ? ?Other reaction(s): Other (See Comments) ?REACTION: rash, swelling. Remote reaction as a child to [place]. Patient did tolerate recent course of augmentin  ?REACTION: rash, swelling. Remote reaction as a child to [place]. Patient did tolerate recent course of augmentin  ?Has patient had a PCN reaction causing immediate rash, facial/tongue/throat swelling, SOB or lightheadedness with hypotension: Yes ?Has patient had a PCN reaction causing severe rash involving mucus membranes or skin necrosis: No ?Has patient had a PCN reaction that required hospitalization: No ?Has patient had a PCN reaction occurring within the last 10 years:  No ?If all of the above answe ?TOLERATED CEFAZOLIN 07/2020 ?Has patient had a PCN reaction causing immediate rash, facial/tongue/throat swelling, SOB or lightheadedness with hypotension: Yes ?Has patient had a PCN reactio

## 2022-02-10 NOTE — Progress Notes (Signed)
Patient connected to peritoneal dialysis cycler [id number] without complications. ?

## 2022-02-10 NOTE — TOC Initial Note (Addendum)
Transition of Care (TOC) - Initial/Assessment Note  ? ? ?Patient Details  ?Name: Andrea Stewart ?MRN: [id number] ?Date of Birth: [birth year] ? ?Transition of Care (TOC) CM/SW Contact:    ?Candie Chroman, LCSW ?Phone Number: ?02/10/2022, 11:59 AM ? ?Clinical Narrative: Readmission prevention screen complete. CSW met with patient. Husband at bedside. CSW introduced role and explained that discharge planning would be discussed. PCP is Emily Filbert, MD. Husband drives at times. When he is unable, she has a nurse/aide who can take her to appointments. She said the RN/aide is not through an agency. Pharmacy is [facility]. No issues obtaining medications. Patient said she is active with [facility] for home health. [facility] representative is confirming. Patient reports having plenty of DME at home: walkers, wheelchairs, bedside commode, and shower stool. Patient reports concern for hospital bills between she and her husband. Emailed Development worker, community to notify. No further concerns. CSW encouraged patient and her husband to contact CSW as needed. CSW will continue to follow patient and her husband for support and facilitate return home when stable. RN/aide will transport her home.          ? ?12:40 pm: Patient active with [facility] for home health PT.       ? ?Expected Discharge Plan: [place] ?Barriers to Discharge: Continued Medical Work up ? ? ?Patient Goals and CMS Choice ?  ?  ?Choice offered to / list presented to : NA ? ?Expected Discharge Plan and Services ?Expected Discharge Plan: [place] ?  ?  ?Post Acute Care Choice: Resumption of Svcs/PTA Provider ?Living arrangements for the past 2 months: [place] ?                ?  ?  ?  ?  ?  ?  ?  ?  ?  ?  ? ?Prior Living Arrangements/Services ?Living arrangements for the past 2 months: [place] ?Lives with:: Spouse ?Patient language and need for interpreter reviewed:: Yes ?Do you feel safe going back  to the place where you live?: Yes      ?Need for Family Participation in Patient Care: Yes (Comment) ?Care giver support system in place?: Yes (comment) ?Current home services: DME ([facility] services unknown at this time.) ?Criminal Activity/Legal Involvement Pertinent to Current Situation/Hospitalization: No - Comment as needed ? ?Activities of Daily Living ?Home Assistive Devices/Equipment: Gilford Rile (specify type), Wheelchair ?ADL Screening (condition at time of admission) ?Patient's cognitive ability adequate to safely complete daily activities?: Yes ?Is the patient deaf or have difficulty hearing?: Yes ?Does the patient have difficulty seeing, even when wearing glasses/contacts?: No ?Does the patient have difficulty concentrating, remembering, or making decisions?: Yes ?Patient able to express need for assistance with ADLs?: Yes ?Does the patient have difficulty dressing or bathing?: Yes ?Independently performs ADLs?: No ?Communication: Independent ?Dressing (OT): Needs assistance ?Is this a change from baseline?: Pre-admission baseline ?Grooming: Needs assistance ?Is this a change from baseline?: Pre-admission baseline ?Feeding: Independent ?Bathing: Needs assistance ?Is this a change from baseline?: Pre-admission baseline ?Toileting: Needs assistance ?Is this a change from baseline?: Pre-admission baseline ?In/Out Bed: Needs assistance ?Is this a change from baseline?: Pre-admission baseline ?Walks in Home: Independent with device (comment) Risk manager, Wheelchair for long distance) ?Does the patient have difficulty walking or climbing stairs?: Yes ?Weakness of Legs: Both ?Weakness of Arms/Hands: None ? ?Permission Sought/Granted ?Permission sought to share information with : Facility Sport and exercise psychologist, Family Supports ?Permission granted to share information  with : Yes, Verbal Permission Granted ? Share Information with NAME: Andrea Stewart ? Permission granted to share info w AGENCY: [place] ? Permission  granted to share info w Relationship: Spouse ? Permission granted to share info w Contact Information: [phone] ? ?Emotional Assessment ?Appearance:: Appears stated age ?Attitude/Demeanor/Rapport: Engaged, Gracious ?Affect (typically observed): Accepting, Appropriate, Calm, Pleasant ?Orientation: : Oriented to Self, Oriented to Place, Oriented to  Time, Oriented to Situation ?Alcohol / Substance Use: Not Applicable ?Psych Involvement: No (comment) ? ?Admission diagnosis:  Hypokalemia [E87.6] ?Lactic acidosis [E87.20] ?Hypomagnesemia [E83.42] ?Pleural effusion [J90] ?Hyperglycemia [R73.9] ?Atrial fibrillation with rapid ventricular response ([place]) [I48.91] ?Elevated brain natriuretic peptide (BNP) level [R79.89] ?Troponin I above reference range [R77.8] ?Atrial fibrillation with RVR ([place]) [I48.91] ?Chronic kidney disease, unspecified CKD stage [N18.9] ?Patient Active Problem List  ? Diagnosis Date Noted  ? Atrial fibrillation with rapid ventricular response ([place]) 02/09/2022  ? ESRD on peritoneal dialysis ([place]) 02/09/2022  ? Swelling   ? Acute pain of right shoulder   ? Sleep disturbance   ? Labile blood glucose   ? Anemia of chronic disease   ? Intertrochanteric fracture of right hip ([place]) 09/30/2021  ? Closed right hip fracture ([place]) 09/21/2021  ? Fall   ? Humeral head fracture, right, closed, initial encounter   ? Abdominal pain 02/24/2021  ? Hip fracture ([place]) 12/31/2020  ? Insomnia 09/13/2019  ? Pruritus 09/13/2019  ? PAD (peripheral artery disease) ([place]) 05/30/2018  ? Family history of colon cancer requiring screening colonoscopy 04/06/2018  ? Incarcerated ventral hernia 09/07/2017  ? Ventral hernia 09/07/2017  ? Atherosclerosis of native arteries of extremity with intermittent claudication ([place]) 06/21/2017  ? Morbid obesity ([place]) 10/21/2016  ? Edema 06/19/2016  ? Tobacco abuse counseling 12/17/2015  ? Tobacco use disorder 12/11/2015  ? Other specified postprocedural states 08/07/2015  ? CKD (chronic kidney  disease), stage III ([place]) 05/13/2015  ? COPD (chronic obstructive pulmonary disease) with chronic bronchitis ([place]) 01/21/2015  ? Abnormal findings on diagnostic imaging of other specified body structures 01/21/2015  ? Thyroid mass 12/13/2014  ? Hx of left heart catheterization by cutdown   ? Single kidney 09/30/2014  ? Encounter for general adult medical examination without abnormal findings 03/03/2014  ? Personal history of diseases of the blood and blood-forming organs and certain disorders involving the immune mechanism 03/03/2014  ? Pain of right leg 01/28/2014  ? Irritable bowel syndrome 09/09/2012  ? Coronary atherosclerosis 07/26/2009  ? T2DM (type 2 diabetes mellitus) ([place]) 03/09/2007  ? DEPRESSION 03/09/2007  ? PERIPHERAL NEUROPATHY 03/09/2007  ? Essential hypertension 03/09/2007  ? [place] DISEASE 03/09/2007  ? BACK PAIN, CHRONIC 03/09/2007  ? FIBROMYALGIA 03/09/2007  ? Disorder of bone 03/09/2007  ? Major depressive disorder, single episode 03/09/2007  ? Narrowing of intervertebral disc space 03/09/2007  ? 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  ? History of total knee replacement, right 08/04/2006  ? History of total knee replacement, left 11/10/2004  ? ?PCP:  Rusty Aus, MD ?Pharmacy:   ?[facility] ?[name] [address] ?Phone: [phone] Fax: [phone] ? ? ? ? ?Social Determinants of Health (SDOH) Interventions ?  ? ?Readmission Risk Interventions ? ?  02/10/2022  ? 11:55 AM  ?Readmission Risk Prevention Plan  ?Transportation Screening Complete  ?PCP or Specialist Appt within 3-5 Days Complete  ?[place] or Home Care Consult Complete  ?Social Work Consult for [place] Planning/Counseling Complete  ?Palliative Care  Screening Not Applicable  ?Medication Review Press photographer) Complete  ? ? ? ?

## 2022-02-10 NOTE — Progress Notes (Addendum)
Inpatient Diabetes Program Recommendations ? ?AACE/ADA: New Consensus Statement on Inpatient Glycemic Control  ? ?Target Ranges:  Prepandial:   less than 140 mg/dL ?     Peak postprandial:   less than 180 mg/dL (1-2 hours) ?     Critically ill patients:  140 - 180 mg/dL  ? ? Latest Reference Range & Units 02/09/22 15:19 02/09/22 16:43 02/09/22 18:56 02/09/22 21:14 02/10/22 07:33  ?Glucose-Capillary 70 - 99 mg/dL 501 (HH) ? ?Novolog 15 units 452 (H) 336 (H) 306 (H) ? ?Novolog 4 units ? ?Semglee 40 units 80  ? ? Latest Reference Range & Units 02/09/22 13:21  ?Glucose 70 - 99 mg/dL 537 (HH)  ? ? Latest Reference Range & Units 01/02/21 13:53 09/21/21 02:25 02/09/22 19:58  ?Hemoglobin A1C 4.8 - 5.6 % 10.0 (H) 9.7 (H) 11.2 (H)  ? ?Review of Glycemic Control ? ?Diabetes history: DM2 ?Outpatient Diabetes medications: NPH 22 units QAM, NPH 38 units QHS, Humalog 0-5 units TID (dose based on glucose) ?Current orders for Inpatient glycemic control: Semlgee 40 units QHS, Novolog 0-20 units TID with meals, Novolog 0-5 units QHS ? ?Inpatient Diabetes Program Recommendations:   ? ?Insulin: Please consider decreasing Semglee to 35 units QHS. ? ?HbgA1C: A1C 11.2% on 02/09/22 indicating an average glucose of 275 mg/dl over the past 2-3 months. ? ?NOTE: Patient presented to the hospital with abdominal pain, vomiting, and diarrhea. Per H&P, patient reported she was treated for c-diff last month and peritoneal fluid infection earlier in the month. Admitted with a-fib with RVR, CHF exacerbation, concern for peritonitis, diarrhea, and hyperglycemia. Initial glucose 537 mg/dl on 02/09/22. Per chart, patient sees PCP for DM management and was seen 02/03/22 by Dr. Sabra Heck and per office note "-sugars very volatile, higher with recent peritonitis. After much thought we will change her NPH insulin to 25 units in the morning and 30 units at bedtime, has lispro insulin also use on a as needed basis."  A1C was 11.8% on 02/04/22.  ? ?Addendum  02/10/22@10 :10-Spoke with patient at bedside. Patient reports that she is taking NPH 22 units QAM, 38 units QHS and also taking a short acting insulin but not sure of name of it. Patient reports she sees Dr. Chalmers Cater (Endocrinologist) and was recently started on short acting insulin 0-5 units as needed. Patient reports that glucose has been running high especially with recent c-diff and peritonitis. Discussed A1C of 11.2% on 02/09/22. Reviewed A1C and glucose goals. Stressed importance of getting DM under better control. Encouraged patient to reach out to Dr. Chalmers Cater regarding glucose control to discuss if she needs to make additional adjustments with DM medications. Patient states she gets insulin [facility] in [place].  Patient verbalized understanding of information discussed and has no questions at this time related to DM. [place] and told that patient is prescribed Novolin N 20 units QAM, Novolin N 35 units QPM, and Humalog 3-5 units TID. ? ?Thanks, ?Barnie Alderman, RN, MSN, CDE ?Diabetes Coordinator ?Inpatient Diabetes Program ?[phone] (Team Pager from 8am to 5pm) ? ? ? ?

## 2022-02-10 NOTE — Progress Notes (Signed)
Critical Potassium 2.5. NP notified.  ?

## 2022-02-10 NOTE — Progress Notes (Addendum)
?[facility]  ?ROUNDING NOTE  ? ?Subjective:  ? ?Andrea Stewart is a 76 y.o.female with a past medical concerns including COPD, hypertension, depression, diabetes and end stage renal disease. Patient presents to ED with complaints vomiting, diarrhea and abdominal pain. She has been admitted for Hypokalemia [E87.6] ?Lactic acidosis [E87.20] ?Hypomagnesemia [E83.42] ?Pleural effusion [J90] ?Hyperglycemia [R73.9] ?Atrial fibrillation with rapid ventricular response ([place]) [I48.91] ?Elevated brain natriuretic peptide (BNP) level [R79.89] ?Troponin I above reference range [R77.8] ?Atrial fibrillation with RVR ([place]) [I48.91] ?Chronic kidney disease, unspecified CKD stage [N18.9] ? ?Patient is known to our clinic and peritoneal dialysis treatments monitored at [facility] under Dr. Juleen China.  Patient is resting in bed with husband at bedside.  They report they have maintained peritoneal dialysis schedule with 2.5 and 4.25 bags of dialysate.  Patient states UF on night prior to admission was 1100L.  She reports no changes in diet or fluid intake.  But states that she kept swelling in both feet and unable to tolerate fluids or solids.  She states mild abdominal pain during dialysis.  She was recently diagnosed with peritonitis and received antibiotic therapy outpatient. ? ?Labs on ED arrival include sodium 132, potassium 3.0, glucose 537 and creatinine 3.6 along with GFR 13.  Urinalysis positive for glucose but negative for ketones, moderate hematuria, proteinuria.  PD fluid sample collected shows yellow, hazy fluid.  Peritoneal fluid culture pending.  Chest x-ray shows some pulmonary edema. ? ?We have been consulted to manage dialysis needs and evaluate possible peritonitis ? ?Objective:  ?Vital signs in last 24 hours:  ?Temp:  [97.9 ?F (36.6 ?C)-98.8 ?F (37.1 ?C)] 98.1 ?F (36.7 ?C) (03/29 1126) ?Pulse Rate:  [34-121] 59 (03/29 1126) ?Resp:  [18-33] 18 (03/29 1126) ?BP: (80-137)/(50-83) 129/69 (03/29  1126) ?SpO2:  [92 %-98 %] 96 % (03/29 1126) ?Weight:  [91.1 kg] 91.1 kg (03/28 1946) ? ?Weight change:  ?Filed Weights  ? 02/09/22 1946  ?Weight: 91.1 kg  ? ? ?Intake/Output: ?I/O last 3 completed shifts: ?In: 845 [P.O.:240; I.V.:51.1; IV Piggyback:553.9] ?Out: -  ?  ?Intake/Output this shift: ? Total I/O ?In: 480 [P.O.:480] ?Out: 700 [Urine:700] ? ?Physical Exam: ?General: NAD, resting in bed  ?Head: Normocephalic, atraumatic. Moist oral mucosal membranes  ?Eyes: Anicteric  ?Lungs:  Clear to auscultation, normal effort  ?Heart: Regular rate and rhythm  ?Abdomen:  Soft, nontender, obese, PD catheter  ?Extremities: 1+ peripheral edema.  ?Neurologic: Nonfocal, moving all four extremities  ?Skin: No lesions  ?Access: PD catheter  ? ? ?Basic Metabolic Panel: ?Recent Labs  ?Lab 02/09/22 ?1321 02/09/22 ?1956 02/10/22 ?0310  ?NA 132* 133* 135  ?K 3.0* 2.3* 2.5*  ?CL 92* 95* 98  ?CO2 26 28 29   ?GLUCOSE 537* 313* 176*  ?BUN 19 19 20   ?CREATININE 3.61* 3.70* 3.82*  ?CALCIUM 7.9* 7.9* 7.9*  ?MG 1.1*  --  2.2  ? ? ?Liver Function Tests: ?Recent Labs  ?Lab 02/09/22 ?1321 02/10/22 ?0310  ?AST 30 16  ?ALT 14 13  ?ALKPHOS 95 76  ?BILITOT 1.4* 0.6  ?PROT 6.2* 5.4*  ?ALBUMIN 2.2* 1.8*  ? ?Recent Labs  ?Lab 02/09/22 ?1321  ?LIPASE 19  ? ?No results for input(s): AMMONIA in the last 168 hours. ? ?CBC: ?Recent Labs  ?Lab 02/09/22 ?1321 02/10/22 ?0310  ?WBC 8.6 9.4  ?HGB 10.8* 9.5*  ?HCT 34.0* 29.3*  ?MCV 94.7 93.6  ?PLT 184 261  ? ? ?Cardiac Enzymes: ?No results for input(s): CKTOTAL, CKMB, CKMBINDEX, TROPONINI in the last  168 hours. ? ?BNP: ?Invalid input(s): POCBNP ? ?CBG: ?Recent Labs  ?Lab 02/09/22 ?1643 02/09/22 ?1856 02/09/22 ?2114 02/10/22 ?8768 02/10/22 ?1126  ?GLUCAP 452* 336* 306* 80 135*  ? ? ?Microbiology: ?Results for orders placed or performed during the hospital encounter of 02/09/22  ?Blood Culture (routine x 2)     Status: None (Preliminary result)  ? Collection Time: 02/09/22  1:22 PM  ? Specimen: BLOOD  ?Result Value  Ref Range Status  ? Specimen Description BLOOD BLOOD LEFT FOREARM  Final  ? Special Requests   Final  ?  BOTTLES DRAWN AEROBIC AND ANAEROBIC Blood Culture results may not be optimal due to an inadequate volume of blood received in culture bottles  ? Culture   Final  ?  NO GROWTH < 24 HOURS ?Performed at [facility], [address], [place], [place] [address] ?  ? Report Status PENDING  Incomplete  ?Blood Culture (routine x 2)     Status: None (Preliminary result)  ? Collection Time: 02/09/22  1:22 PM  ? Specimen: BLOOD  ?Result Value Ref Range Status  ? Specimen Description BLOOD BLOOD RIGHT HAND  Final  ? Special Requests   Final  ?  BOTTLES DRAWN AEROBIC AND ANAEROBIC Blood Culture results may not be optimal due to an inadequate volume of blood received in culture bottles  ? Culture   Final  ?  NO GROWTH < 24 HOURS ?Performed at [facility], [address] [place], [place] [address] ?  ? Report Status PENDING  Incomplete  ?Resp Panel by RT-PCR (Flu A&B, Covid) Nasopharyngeal Swab     Status: None  ? Collection Time: 02/09/22  1:22 PM  ? Specimen: Nasopharyngeal Swab; Nasopharyngeal(NP) swabs in vial transport medium  ?Result Value Ref Range Status  ? SARS Coronavirus 2 by RT PCR NEGATIVE NEGATIVE Final  ?  Comment: (NOTE) ?SARS-CoV-2 target nucleic acids are NOT DETECTED. ? ?The SARS-CoV-2 RNA is generally detectable in upper respiratory ?specimens during the acute phase of infection. The lowest ?concentration of SARS-CoV-2 viral copies this assay can detect is ?138 copies/mL. A negative result does not preclude SARS-Cov-2 ?infection and should not be used as the sole basis for treatment or ?other patient management decisions. A negative result may occur with  ?improper specimen collection/handling, submission of specimen other ?than nasopharyngeal swab, presence of viral mutation(s) within the ?areas targeted by this assay, and inadequate number of viral ?copies(<138 copies/mL). A negative  result must be combined with ?clinical observations, patient history, and epidemiological ?information. The expected result is Negative. ? ?Fact Sheet for Patients:  ?EntrepreneurPulse.com.au ? ?Fact Sheet for Healthcare Providers:  ?IncredibleEmployment.be ? ?This test is no t yet approved or cleared by the [place] FDA and  ?has been authorized for detection and/or diagnosis of SARS-CoV-2 by ?FDA under an Emergency Use Authorization (EUA). This EUA will remain  ?in effect (meaning this test can be used) for the duration of the ?COVID-19 declaration under Section 564(b)(1) of the Act, 21 ?U.S.C.section 360bbb-3(b)(1), unless the authorization is terminated  ?or revoked sooner.  ? ? ?  ? Influenza A by PCR NEGATIVE NEGATIVE Final  ? Influenza B by PCR NEGATIVE NEGATIVE Final  ?  Comment: (NOTE) ?The Xpert Xpress SARS-CoV-2/FLU/RSV plus assay is intended as an aid ?in the diagnosis of influenza from Nasopharyngeal swab specimens and ?should not be used as a sole basis for treatment. Nasal washings and ?aspirates are unacceptable for Xpert Xpress SARS-CoV-2/FLU/RSV ?testing. ? ?Fact Sheet for Patients: ?  EntrepreneurPulse.com.au ? ?Fact Sheet for Healthcare Providers: ?IncredibleEmployment.be ? ?This test is not yet approved or cleared by the [place] FDA and ?has been authorized for detection and/or diagnosis of SARS-CoV-2 by ?FDA under an Emergency Use Authorization (EUA). This EUA will remain ?in effect (meaning this test can be used) for the duration of the ?COVID-19 declaration under Section 564(b)(1) of the Act, 21 U.S.C. ?section 360bbb-3(b)(1), unless the authorization is terminated or ?revoked. ? ?Performed at [facility], [place], ?[name] [address] ?  ?Body fluid culture w Gram Stain     Status: None (Preliminary result)  ? Collection Time: 02/09/22  2:09 PM  ? Specimen: Peritoneal Washings; Body Fluid   ?Result Value Ref Range Status  ? Specimen Description   Final  ?  PERITONEAL ?Performed at [facility], [address] [place], [place] [address] ?  ? Special Requests   Final  ?  Normal

## 2022-02-10 NOTE — Progress Notes (Signed)
?PROGRESS NOTE ? ? ? ?Andrea Stewart  QBH:[id number] DOB: 1946/04/17 DOA: 02/09/2022 ?PCP: Rusty Aus, MD  ?[phone] ? ? ?Assessment & Plan: ?  ?Principal Problem: ?  Atrial fibrillation with rapid ventricular response ([place]) ?Active Problems: ?  T2DM (type 2 diabetes mellitus) ([place]) ?  Essential hypertension ?  Single kidney ?  COPD (chronic obstructive pulmonary disease) with chronic bronchitis ([place]) ?  Morbid obesity ([place]) ?  PAD (peripheral artery disease) ([place]) ?  ESRD on peritoneal dialysis [facility]) ? ? ?Andrea Stewart is a 76 y.o. female with medical history significant for morbid obesity, esrd on peritoneal dialysis, PAD, COPD, who presented with abdominal pain, vomiting, diarrhea. ?  ?She says she was treated for c diff colitis last month and for peritoneal fluid infection earlier this month, both by her nephrologist. She says she has developed diffuse abdominal pain for the past three days along with dyspnea, vomiting, and non-bloody diarrhea. ? ?Found to be in a fib with rvr. Started on diltiazim gtt after bolus failed to improve HR.  HR controlled and dilt gtt weaned off by midnight. ?  ?# Abdominal pain with concern for peritonitis ?Treated for e faecalis peritonitis due to Enterococcus faecalis 3 weeks ago. Now with new abd pain, vomiting/diarrhea ?- peritoneal fluid sent for analysis/culture ?- ID consult today, have not started tx for bacterial peritonitis ? ?# Diarrhea ?Recently treated for c diff.  No acute GI findings on ct c/a/p.  Started on fidaxomicin  ?--C diff and GI path both neg ?--pt does not want Imodium as 1 dose gives her constipation ?Plan: ?--d/c fidaxomicin  ?--start fibercon for stool bulking  ? ?# A fib with rvr ?Patient says she has a hx of this but I don't see mention of it in her most recent cardiology progress note.  Med rec noted not taking metop.  HR controlled on dilt gtt. ?  ?# Dyspnea 2/2 ?Pulm edema 2/2 ?dCHF exacerbation ?--Echo normal LVEF, grade I  DD. ?--started on IV lasix 80 mg daily ?Plan: ?--cont IV lasix 80 mg daily ?  ?# DM2, poorly controlled with Hyperglycemia ?On presentation, glucose very elevated to 500s, no signs dka ?--reduce glargine to 35u nightly ?--SSI ?  ?# Lactic acidosis, resolved ?2/2 all the above ? ?# Hypokalemia ?# Hypomagnesemia ?--monitor and replete PRN ?  ?# Chronic pain ?--cont home Norco PRN ?  ?# GAD ?- home xanax PRN, amitryptyline ?  ?# COPD ?- home dulera ? ? ?DVT prophylaxis: Heparin SQ ?Code Status: Full code  ?Family Communication: husband updated at bedside today ?Level of care: Progressive ?Dispo:   ?The patient is from: home ?Anticipated d/c is to: home ?Anticipated d/c date is: 2-3 days ?Patient currently is not medically ready to d/c due to: pending workup for peritonitis  ? ? ?Subjective and Interval History:  ?Reported still having abdominal pain.  Dyspnea improved.  Swelling improved.  Ate better.  Still making some urine. ? ? ?Objective: ?Vitals:  ? 02/10/22 0415 02/10/22 0734 02/10/22 1126 02/10/22 1708  ?BP: 100/69 100/64 129/69 111/66  ?Pulse: 100 100 (!) 59 80  ?Resp: (!) 28 19 18 19   ?Temp:  97.9 ?F (36.6 ?C) 98.1 ?F (36.7 ?C) 97.8 ?F (36.6 ?C)  ?TempSrc:  Oral  Oral  ?SpO2: 97% 96% 96% 95%  ?Weight:      ?Height:      ? ? ?Intake/Output Summary (Last 24 hours) at 02/10/2022 2042 ?Last data filed at 02/10/2022 1701 ?Gross per 24 hour  ?  Intake 1044.19 ml  ?Output 700 ml  ?Net 344.19 ml  ? ?Filed Weights  ? 02/09/22 1946  ?Weight: 91.1 kg  ? ? ?Examination:  ? ?Constitutional: NAD, AAOx3 ?HEENT: conjunctivae and lids normal, EOMI ?CV: No cyanosis.   ?RESP: normal respiratory effort, on RA ?Extremities: No effusions, edema in BLE ?SKIN: warm, dry ?Neuro: II - XII grossly intact.   ?Psych: Normal mood and affect.  Appropriate judgement and reason ? ? ?Data Reviewed: I have personally reviewed following labs and imaging studies ? ?CBC: ?Recent Labs  ?Lab 02/09/22 ?1321 02/10/22 ?0310  ?WBC 8.6 9.4  ?HGB 10.8* 9.5*   ?HCT 34.0* 29.3*  ?MCV 94.7 93.6  ?PLT 184 261  ? ?Basic Metabolic Panel: ?Recent Labs  ?Lab 02/09/22 ?1321 02/09/22 ?1956 02/10/22 ?0310  ?NA 132* 133* 135  ?K 3.0* 2.3* 2.5*  ?CL 92* 95* 98  ?CO2 26 28 29   ?GLUCOSE 537* 313* 176*  ?BUN 19 19 20   ?CREATININE 3.61* 3.70* 3.82*  ?CALCIUM 7.9* 7.9* 7.9*  ?MG 1.1*  --  2.2  ? ?GFR: ?Estimated Creatinine Clearance: 12.3 mL/min (A) (by C-G formula based on SCr of 3.82 mg/dL (H)). ?Liver Function Tests: ?Recent Labs  ?Lab 02/09/22 ?1321 02/10/22 ?0310  ?AST 30 16  ?ALT 14 13  ?ALKPHOS 95 76  ?BILITOT 1.4* 0.6  ?PROT 6.2* 5.4*  ?ALBUMIN 2.2* 1.8*  ? ?Recent Labs  ?Lab 02/09/22 ?1321  ?LIPASE 19  ? ?No results for input(s): AMMONIA in the last 168 hours. ?Coagulation Profile: ?Recent Labs  ?Lab 02/09/22 ?1321  ?INR 1.0  ? ?Cardiac Enzymes: ?No results for input(s): CKTOTAL, CKMB, CKMBINDEX, TROPONINI in the last 168 hours. ?BNP (last 3 results) ?No results for input(s): PROBNP in the last 8760 hours. ?HbA1C: ?Recent Labs  ?  02/09/22 ?1958  ?HGBA1C 11.2*  ? ?CBG: ?Recent Labs  ?Lab 02/09/22 ?2114 02/10/22 ?0733 02/10/22 ?1126 02/10/22 ?1707 02/10/22 ?1959  ?GLUCAP 306* 80 135* 64* 175*  ? ?Lipid Profile: ?No results for input(s): CHOL, HDL, LDLCALC, TRIG, CHOLHDL, LDLDIRECT in the last 72 hours. ?Thyroid Function Tests: ?Recent Labs  ?  02/09/22 ?1519  ?TSH 2.824  ? ?Anemia Panel: ?No results for input(s): VITAMINB12, FOLATE, FERRITIN, TIBC, IRON, RETICCTPCT in the last 72 hours. ?Sepsis Labs: ?Recent Labs  ?Lab 02/09/22 ?1321 02/09/22 ?1519 02/10/22 ?0501  ?LATICACIDVEN 2.7* 2.7* 1.1  ? ? ?Recent Results (from the past 240 hour(s))  ?Blood Culture (routine x 2)     Status: None (Preliminary result)  ? Collection Time: 02/09/22  1:22 PM  ? Specimen: BLOOD  ?Result Value Ref Range Status  ? Specimen Description BLOOD BLOOD LEFT FOREARM  Final  ? Special Requests   Final  ?  BOTTLES DRAWN AEROBIC AND ANAEROBIC Blood Culture results may not be optimal due to an inadequate  volume of blood received in culture bottles  ? Culture   Final  ?  NO GROWTH < 24 HOURS ?Performed at [facility], [address] [place], [place] [address] ?  ? Report Status PENDING  Incomplete  ?Blood Culture (routine x 2)     Status: None (Preliminary result)  ? Collection Time: 02/09/22  1:22 PM  ? Specimen: BLOOD  ?Result Value Ref Range Status  ? Specimen Description BLOOD BLOOD RIGHT HAND  Final  ? Special Requests   Final  ?  BOTTLES DRAWN AEROBIC AND ANAEROBIC Blood Culture results may not be optimal due to an inadequate volume of blood received in culture bottles  ?  Culture   Final  ?  NO GROWTH < 24 HOURS ?Performed at [facility], [address] [place], [place] [address] ?  ? Report Status PENDING  Incomplete  ?Resp Panel by RT-PCR (Flu A&B, Covid) Nasopharyngeal Swab     Status: None  ? Collection Time: 02/09/22  1:22 PM  ? Specimen: Nasopharyngeal Swab; Nasopharyngeal(NP) swabs in vial transport medium  ?Result Value Ref Range Status  ? SARS Coronavirus 2 by RT PCR NEGATIVE NEGATIVE Final  ?  Comment: (NOTE) ?SARS-CoV-2 target nucleic acids are NOT DETECTED. ? ?The SARS-CoV-2 RNA is generally detectable in upper respiratory ?specimens during the acute phase of infection. The lowest ?concentration of SARS-CoV-2 viral copies this assay can detect is ?138 copies/mL. A negative result does not preclude SARS-Cov-2 ?infection and should not be used as the sole basis for treatment or ?other patient management decisions. A negative result may occur with  ?improper specimen collection/handling, submission of specimen other ?than nasopharyngeal swab, presence of viral mutation(s) within the ?areas targeted by this assay, and inadequate number of viral ?copies(<138 copies/mL). A negative result must be combined with ?clinical observations, patient history, and epidemiological ?information. The expected result is Negative. ? ?Fact Sheet for Patients:   ?EntrepreneurPulse.com.au ? ?Fact Sheet for Healthcare Providers:  ?IncredibleEmployment.be ? ?This test is no t yet approved or cleared by the [place] FDA and  ?has been authorized for detection and/or diagnosis

## 2022-02-11 DIAGNOSIS — T8571XA Infection and inflammatory reaction due to peritoneal dialysis catheter, initial encounter: Secondary | ICD-10-CM

## 2022-02-11 DIAGNOSIS — I4891 Unspecified atrial fibrillation: Secondary | ICD-10-CM | POA: Diagnosis not present

## 2022-02-11 LAB — CBC
HCT: 29.4 % — ABNORMAL LOW (ref 36.0–46.0)
Hemoglobin: 9.5 g/dL — ABNORMAL LOW (ref 12.0–15.0)
MCH: 30.6 pg (ref 26.0–34.0)
MCHC: 32.3 g/dL (ref 30.0–36.0)
MCV: 94.8 fL (ref 80.0–100.0)
Platelets: 258 10*3/uL (ref 150–400)
RBC: 3.1 MIL/uL — ABNORMAL LOW (ref 3.87–5.11)
RDW: 13.6 % (ref 11.5–15.5)
WBC: 6.2 10*3/uL (ref 4.0–10.5)
nRBC: 0 % (ref 0.0–0.2)

## 2022-02-11 LAB — GLUCOSE, CAPILLARY
Glucose-Capillary: 201 mg/dL — ABNORMAL HIGH (ref 70–99)
Glucose-Capillary: 97 mg/dL (ref 70–99)
Glucose-Capillary: 42 mg/dL — CL (ref 70–99)
Glucose-Capillary: 46 mg/dL — ABNORMAL LOW (ref 70–99)
Glucose-Capillary: 149 mg/dL — ABNORMAL HIGH (ref 70–99)
Glucose-Capillary: 173 mg/dL — ABNORMAL HIGH (ref 70–99)

## 2022-02-11 LAB — BASIC METABOLIC PANEL
Anion gap: 7 (ref 5–15)
BUN: 18 mg/dL (ref 8–23)
CO2: 28 mmol/L (ref 22–32)
Calcium: 7.9 mg/dL — ABNORMAL LOW (ref 8.9–10.3)
Chloride: 99 mmol/L (ref 98–111)
Creatinine, Ser: 3.53 mg/dL — ABNORMAL HIGH (ref 0.44–1.00)
GFR, Estimated: 13 mL/min — ABNORMAL LOW (ref >60)
Glucose, Bld: 285 mg/dL — ABNORMAL HIGH (ref 70–99)
Potassium: 2.7 mmol/L — CL (ref 3.5–5.1)
Sodium: 134 mmol/L — ABNORMAL LOW (ref 135–145)

## 2022-02-11 LAB — URINE CULTURE

## 2022-02-11 LAB — MAGNESIUM: Magnesium: 1.8 mg/dL (ref 1.7–2.4)

## 2022-02-11 LAB — HEPATITIS B SURFACE ANTIBODY, QUANTITATIVE: Hep B S AB Quant (Post): <3.1 m[IU]/mL — ABNORMAL LOW (ref >9.9)

## 2022-02-11 MED ORDER — SODIUM CHLORIDE 0.9 % IV SOLN
1.0000 g | INTRAVENOUS | Status: DC
  Administered 2022-02-12 – 2022-02-13 (×2): 1 g via INTRAVENOUS
  Filled 2022-02-11 (×3): qty 1

## 2022-02-11 MED ORDER — POTASSIUM CHLORIDE CRYS ER 20 MEQ PO TBCR
40.0000 meq | EXTENDED_RELEASE_TABLET | ORAL | Status: AC
  Administered 2022-02-11 (×2): 40 meq via ORAL
  Filled 2022-02-11 (×2): qty 2

## 2022-02-11 MED ORDER — VANCOMYCIN HCL 1750 MG/350ML IV SOLN
1750.0000 mg | Freq: Once | INTRAVENOUS | Status: DC
  Filled 2022-02-11: qty 350

## 2022-02-11 MED ORDER — VANCOMYCIN HCL IN DEXTROSE 1-5 GM/200ML-% IV SOLN
1000.0000 mg | Freq: Once | INTRAVENOUS | Status: DC
Start: 2022-02-11 — End: 2022-02-13
  Filled 2022-02-11: qty 200

## 2022-02-11 MED ORDER — INSULIN DETEMIR 100 UNIT/ML ~~LOC~~ SOLN
18.0000 [IU] | Freq: Two times a day (BID) | SUBCUTANEOUS | Status: DC
  Administered 2022-02-12: 18 [IU] via SUBCUTANEOUS
  Filled 2022-02-11 (×2): qty 0.18

## 2022-02-11 MED ORDER — DEXTROSE 50 % IV SOLN
INTRAVENOUS | Status: AC
  Administered 2022-02-11: 50 mL
  Filled 2022-02-11: qty 50

## 2022-02-11 MED ORDER — VANCOMYCIN HCL 1500 MG/300ML IV SOLN
1500.0000 mg | Freq: Once | INTRAVENOUS | Status: AC
  Administered 2022-02-11: 1500 mg via INTRAVENOUS
  Filled 2022-02-11: qty 300

## 2022-02-11 MED ORDER — SPIRONOLACTONE 25 MG PO TABS
25.0000 mg | ORAL_TABLET | Freq: Every day | ORAL | Status: DC
  Administered 2022-02-11 – 2022-02-14 (×4): 25 mg via ORAL
  Filled 2022-02-11 (×4): qty 1

## 2022-02-11 MED ORDER — SODIUM CHLORIDE 0.9 % IV SOLN
1.0000 g | Freq: Once | INTRAVENOUS | Status: AC
  Administered 2022-02-11: 1 g via INTRAVENOUS
  Filled 2022-02-11: qty 1

## 2022-02-11 NOTE — Progress Notes (Addendum)
Patient blood sugar dropped to 46.  Gave some apple juice will recheck again in 15 minute.   Patient alert and oriented (x4), but did have an episode of not feeling well when she went to bathroom (dizziess).   ? ?1710- recheck blood sugar.  Dropped to 42.  Amp of dex given.   ? ?

## 2022-02-11 NOTE — Progress Notes (Signed)
?[facility]  ?ROUNDING NOTE  ? ?Subjective:  ? ?Andrea Stewart is a 76 y.o.female with a past medical concerns including COPD, hypertension, depression, diabetes and end stage renal disease. Patient presents to ED with complaints vomiting, diarrhea and abdominal pain. She has been admitted for Hypokalemia [E87.6] ?Lactic acidosis [E87.20] ?Hypomagnesemia [E83.42] ?Pleural effusion [J90] ?Hyperglycemia [R73.9] ?Atrial fibrillation with rapid ventricular response ([place]) [I48.91] ?Elevated brain natriuretic peptide (BNP) level [R79.89] ?Troponin I above reference range [R77.8] ?Atrial fibrillation with RVR ([place]) [I48.91] ?Chronic kidney disease, unspecified CKD stage [N18.9] ? ?Patient is known to our clinic and peritoneal dialysis treatments monitored at [facility] under Dr. Juleen China.   ? ?Patient seen sitting at side of the bed, breakfast tray at bedside ?Alert and oriented ?Husband arrives at bedside via wheelchair during our visit ?Patient states she feels slightly better and is ready for discharge ?Tolerated PD treatment well overnight, denies pain or discomfort ?Frustrated with no UF from treatment ? ?Objective:  ?Vital signs in last 24 hours:  ?Temp:  [97.8 ?F (36.6 ?C)-98.7 ?F (37.1 ?C)] 98 ?F (36.7 ?C) (03/30 1140) ?Pulse Rate:  [42-123] 85 (03/30 1140) ?Resp:  [17-32] 24 (03/30 1140) ?BP: (111-148)/(60-124) 135/81 (03/30 1140) ?SpO2:  [89 %-99 %] 96 % (03/30 1140) ? ?Weight change:  ?Filed Weights  ? 02/09/22 1946  ?Weight: 91.1 kg  ? ? ?Intake/Output: ?I/O last 3 completed shifts: ?In: 1044.2 [P.O.:960; I.V.:30.3; IV Piggyback:53.9] ?Out: 700 [Urine:700] ?  ?Intake/Output this shift: ? Total I/O ?In: 10480 [P.O.:480; Other:10000] ?Out: 9371 [Urine:100; Other:9773] ? ?Physical Exam: ?General: NAD  ?Head: Normocephalic, atraumatic. Moist oral mucosal membranes  ?Eyes: Anicteric  ?Lungs:  Crackles and rhonchi, normal effort  ?Heart: Regular rate and rhythm  ?Abdomen:  Soft, nontender,  obese, PD catheter  ?Extremities: 1+ peripheral edema.  ?Neurologic: Nonfocal, moving all four extremities  ?Skin: No lesions  ?Access: PD catheter  ? ? ?Basic Metabolic Panel: ?Recent Labs  ?Lab 02/09/22 ?1321 02/09/22 ?1956 02/10/22 ?0310 02/11/22 ?0440  ?NA 132* 133* 135 134*  ?K 3.0* 2.3* 2.5* 2.7*  ?CL 92* 95* 98 99  ?CO2 26 28 29 28   ?GLUCOSE 537* 313* 176* 285*  ?BUN 19 19 20 18   ?CREATININE 3.61* 3.70* 3.82* 3.53*  ?CALCIUM 7.9* 7.9* 7.9* 7.9*  ?MG 1.1*  --  2.2 1.8  ? ? ? ?Liver Function Tests: ?Recent Labs  ?Lab 02/09/22 ?1321 02/10/22 ?0310  ?AST 30 16  ?ALT 14 13  ?ALKPHOS 95 76  ?BILITOT 1.4* 0.6  ?PROT 6.2* 5.4*  ?ALBUMIN 2.2* 1.8*  ? ? ?Recent Labs  ?Lab 02/09/22 ?1321  ?LIPASE 19  ? ? ?No results for input(s): AMMONIA in the last 168 hours. ? ?CBC: ?Recent Labs  ?Lab 02/09/22 ?1321 02/10/22 ?0310 02/11/22 ?0440  ?WBC 8.6 9.4 6.2  ?HGB 10.8* 9.5* 9.5*  ?HCT 34.0* 29.3* 29.4*  ?MCV 94.7 93.6 94.8  ?PLT 184 261 258  ? ? ? ?Cardiac Enzymes: ?No results for input(s): CKTOTAL, CKMB, CKMBINDEX, TROPONINI in the last 168 hours. ? ?BNP: ?Invalid input(s): POCBNP ? ?CBG: ?Recent Labs  ?Lab 02/10/22 ?1126 02/10/22 ?1707 02/10/22 ?1959 02/11/22 ?0735 02/11/22 ?1140  ?GLUCAP 135* 64* 175* 201* 97  ? ? ? ?Microbiology: ?Results for orders placed or performed during the hospital encounter of 02/09/22  ?Blood Culture (routine x 2)     Status: None (Preliminary result)  ? Collection Time: 02/09/22  1:22 PM  ? Specimen: BLOOD  ?Result Value Ref Range Status  ? Specimen Description  BLOOD BLOOD LEFT FOREARM  Final  ? Special Requests   Final  ?  BOTTLES DRAWN AEROBIC AND ANAEROBIC Blood Culture results may not be optimal due to an inadequate volume of blood received in culture bottles  ? Culture   Final  ?  NO GROWTH 2 DAYS ?Performed at [facility], [address] [place], [place] [address] ?  ? Report Status PENDING  Incomplete  ?Blood Culture (routine x 2)     Status: None (Preliminary result)  ?  Collection Time: 02/09/22  1:22 PM  ? Specimen: BLOOD  ?Result Value Ref Range Status  ? Specimen Description BLOOD BLOOD RIGHT HAND  Final  ? Special Requests   Final  ?  BOTTLES DRAWN AEROBIC AND ANAEROBIC Blood Culture results may not be optimal due to an inadequate volume of blood received in culture bottles  ? Culture   Final  ?  NO GROWTH 2 DAYS ?Performed at [facility], [address], [place], [place] [address] ?  ? Report Status PENDING  Incomplete  ?Resp Panel by RT-PCR (Flu A&B, Covid) Nasopharyngeal Swab     Status: None  ? Collection Time: 02/09/22  1:22 PM  ? Specimen: Nasopharyngeal Swab; Nasopharyngeal(NP) swabs in vial transport medium  ?Result Value Ref Range Status  ? SARS Coronavirus 2 by RT PCR NEGATIVE NEGATIVE Final  ?  Comment: (NOTE) ?SARS-CoV-2 target nucleic acids are NOT DETECTED. ? ?The SARS-CoV-2 RNA is generally detectable in upper respiratory ?specimens during the acute phase of infection. The lowest ?concentration of SARS-CoV-2 viral copies this assay can detect is ?138 copies/mL. A negative result does not preclude SARS-Cov-2 ?infection and should not be used as the sole basis for treatment or ?other patient management decisions. A negative result may occur with  ?improper specimen collection/handling, submission of specimen other ?than nasopharyngeal swab, presence of viral mutation(s) within the ?areas targeted by this assay, and inadequate number of viral ?copies(<138 copies/mL). A negative result must be combined with ?clinical observations, patient history, and epidemiological ?information. The expected result is Negative. ? ?Fact Sheet for Patients:  ?EntrepreneurPulse.com.au ? ?Fact Sheet for Healthcare Providers:  ?IncredibleEmployment.be ? ?This test is no t yet approved or cleared by the [place] FDA and  ?has been authorized for detection and/or diagnosis of SARS-CoV-2 by ?FDA under an Emergency Use Authorization (EUA).  This EUA will remain  ?in effect (meaning this test can be used) for the duration of the ?COVID-19 declaration under Section 564(b)(1) of the Act, 21 ?U.S.C.section 360bbb-3(b)(1), unless the authorization is terminated  ?or revoked sooner.  ? ? ?  ? Influenza A by PCR NEGATIVE NEGATIVE Final  ? Influenza B by PCR NEGATIVE NEGATIVE Final  ?  Comment: (NOTE) ?The Xpert Xpress SARS-CoV-2/FLU/RSV plus assay is intended as an aid ?in the diagnosis of influenza from Nasopharyngeal swab specimens and ?should not be used as a sole basis for treatment. Nasal washings and ?aspirates are unacceptable for Xpert Xpress SARS-CoV-2/FLU/RSV ?testing. ? ?Fact Sheet for Patients: ?EntrepreneurPulse.com.au ? ?Fact Sheet for Healthcare Providers: ?IncredibleEmployment.be ? ?This test is not yet approved or cleared by the [place] FDA and ?has been authorized for detection and/or diagnosis of SARS-CoV-2 by ?FDA under an Emergency Use Authorization (EUA). This EUA will remain ?in effect (meaning this test can be used) for the duration of the ?COVID-19 declaration under Section 564(b)(1) of the Act, 21 U.S.C. ?section 360bbb-3(b)(1), unless the authorization is terminated or ?revoked. ? ?Performed at [facility], [address]  [place], ?[name] [address] ?  ?Body fluid culture w Gram Stain     Status: None (Preliminary result)  ? Collection Time: 02/09/22  2:09 PM  ? Specimen: Peritoneal Washings; Body Fluid  ?Result Value Ref Range Status  ? Specimen Description   Final  ?  PERITONEAL ?Performed at [facility], [address], [place], [place] [address] ?  ? Special Requests   Final  ?  Normal ?Performed at [facility], [address] [place], [place] [address] ?  ? Gram Stain   Final  ?  FEW WBC PRESENT, PREDOMINANTLY PMN ?NO ORGANISMS SEEN ?  ? Culture   Final  ?  CULTURE REINCUBATED FOR BETTER GROWTH ?Performed at [facility], [place] [address]  [place], [place] [address] ?  ? Report Status PENDING  Incomplete  ?Urine Culture     Status: Abnormal  ? Collection Time: 02/09/22  2:44 PM  ? Specimen: In/Out Cath Urine  ?Result Value Ref Range Status  ? Specimen [place] Must

## 2022-02-11 NOTE — Progress Notes (Signed)
? ?Date of Admission:  02/09/2022   ? ? ?ID: Andrea Stewart is a 76 y.o. female  ?Principal Problem: ?  Atrial fibrillation with rapid ventricular response ([place]) ?Active Problems: ?  T2DM (type 2 diabetes mellitus) ([place]) ?  Essential hypertension ?  Single kidney ?  COPD (chronic obstructive pulmonary disease) with chronic bronchitis ([place]) ?  Morbid obesity ([place]) ?  PAD (peripheral artery disease) ([place]) ?  ESRD on peritoneal dialysis [facility]) ? ? ? ?Subjective ?Was dizzy this monring ?sob ? ?Medications:  ? ALPRAZolam  0.5 mg Oral QHS  ? furosemide  80 mg Intravenous Daily  ? gentamicin cream  1 application. Topical Daily  ? heparin  5,000 Units Subcutaneous Q8H  ? insulin aspart  0-20 Units Subcutaneous TID WC  ? insulin glargine-yfgn  35 Units Subcutaneous QHS  ? mometasone-formoterol  2 puff Inhalation BID  ? polycarbophil  625 mg Oral Daily  ? sodium chloride flush  3 mL Intravenous Q12H  ? spironolactone  25 mg Oral Daily  ? ? ?Objective: ?Vital signs in last 24 hours: ?Temp:  [97.8 ?F (36.6 ?C)-98.7 ?F (37.1 ?C)] 98 ?F (36.7 ?C) (03/30 1140) ?Pulse Rate:  [42-123] 85 (03/30 1140) ?Resp:  [17-32] 24 (03/30 1140) ?BP: (111-148)/(60-124) 135/81 (03/30 1140) ?SpO2:  [89 %-99 %] 96 % (03/30 1140) ? ?PHYSICAL EXAM:  ?General: awake, ill,  ?Lungs: b/l air entry- crepts bases. ?Heart: Regular rate and rhythm, no murmur, rub or gallop. ?Abdomen: Soft, tender on palpation,not distended. Bowel sounds normal. No masses ?PD cath in place ?Extremities: atraumatic, no cyanosis. No edema. No clubbing ?Skin: No rashes or lesions. Or bruising ?Lymph: Cervical, supraclavicular normal. ?Neurologic: Grossly non-focal ? ?Lab Results ?Recent Labs  ?  02/10/22 ?0310 02/11/22 ?0440  ?WBC 9.4 6.2  ?HGB 9.5* 9.5*  ?HCT 29.3* 29.4*  ?NA 135 134*  ?K 2.5* 2.7*  ?CL 98 99  ?CO2 29 28  ?BUN 20 18  ?CREATININE 3.82* 3.53*  ? ?Liver Panel ?Recent Labs  ?  02/09/22 ?1321 02/10/22 ?0310  ?PROT 6.2* 5.4*  ?ALBUMIN 2.2* 1.8*  ?AST 30 16  ?ALT 14  13  ?ALKPHOS 95 76  ?BILITOT 1.4* 0.6  ? ? ?Microbiology: ?Peritoneal  fluid culture pending ?Studies/Results: ?DG Chest Portable 1 View ? ?Result Date: 02/09/2022 ?CLINICAL DATA:  Shortness of breath EXAM: PORTABLE CHEST 1 VIEW COMPARISON:  12/31/2020 FINDINGS: Transverse diameter of heart is increased. Central pulmonary vessels are more prominent. Increased interstitial markings are seen in the parahilar regions and both lower lung fields. Haziness in both lower lung fields suggests layering of small bilateral pleural effusions. Patient's chin is partly obscuring the apices. As far as seen, there is no pneumothorax. Deformity in the proximal shaft of right humerus may suggest old healed fracture. IMPRESSION: Cardiomegaly. Central pulmonary vessels are more prominent suggesting CHF. Increased interstitial markings in the parahilar regions and lower lung fields suggest pulmonary edema. Small bilateral pleural effusions. Electronically Signed   By: Elmer Picker M.D.   On: 02/09/2022 16:50  ? ?ECHOCARDIOGRAM COMPLETE ? ?Result Date: 02/10/2022 ?   ECHOCARDIOGRAM REPORT   Patient Name:   Andrea Stewart Date of Exam: 02/09/2022 Medical Rec #:  [id number]         Height:       59.0 in Accession #:    [id number]        Weight:       177.9 lb Date of Birth:  [birth year]  BSA:          1.755 m? Patient Age:    38 years          BP:           132/64 mmHg Patient Gender: F                 HR:           79 bpm. Exam Location:  [facility] Procedure: 2D Echo, Cardiac Doppler and Color Doppler Indications:     I48.91 Atrial Fibrillation  History:         Patient has no prior history of Echocardiogram examinations.                  COPD; Risk Factors:Hypertension, Diabetes and Obesity.  Sonographer:     Cresenciano Lick RDCS Referring Phys:  [place] DPOE Diagnosing Phys: Kathlyn Sacramento MD IMPRESSIONS  1. Left ventricular ejection fraction, by estimation, is 55 to 60%. The left ventricle has normal function.  The left ventricle has no regional wall motion abnormalities. There is mild left ventricular hypertrophy. Left ventricular diastolic parameters are consistent with Grade I diastolic dysfunction (impaired relaxation).  2. Right ventricular systolic function is normal. The right ventricular size is normal. Tricuspid regurgitation signal is inadequate for assessing PA pressure.  3. Left atrial size was mildly dilated.  4. The mitral valve is normal in structure. No evidence of mitral valve regurgitation. No evidence of mitral stenosis.  5. The aortic valve is normal in structure. Aortic valve regurgitation is not visualized. Aortic valve sclerosis is present, with no evidence of aortic valve stenosis.  6. The inferior vena cava is dilated in size with >50% respiratory variability, suggesting right atrial pressure of 8 mmHg. FINDINGS  Left Ventricle: Left ventricular ejection fraction, by estimation, is 55 to 60%. The left ventricle has normal function. The left ventricle has no regional wall motion abnormalities. The left ventricular internal cavity size was normal in size. There is  mild left ventricular hypertrophy. Left ventricular diastolic parameters are consistent with Grade I diastolic dysfunction (impaired relaxation). Right Ventricle: The right ventricular size is normal. No increase in right ventricular wall thickness. Right ventricular systolic function is normal. Tricuspid regurgitation signal is inadequate for assessing PA pressure. Left Atrium: Left atrial size was mildly dilated. Right Atrium: Right atrial size was normal in size. Pericardium: There is no evidence of pericardial effusion. Mitral Valve: The mitral valve is normal in structure. Mild mitral annular calcification. No evidence of mitral valve regurgitation. No evidence of mitral valve stenosis. Tricuspid Valve: The tricuspid valve is normal in structure. Tricuspid valve regurgitation is not demonstrated. No evidence of tricuspid stenosis.  Aortic Valve: The aortic valve is normal in structure. Aortic valve regurgitation is not visualized. Aortic valve sclerosis is present, with no evidence of aortic valve stenosis. Pulmonic Valve: The pulmonic valve was normal in structure. Pulmonic valve regurgitation is not visualized. No evidence of pulmonic stenosis. Aorta: The aortic root is normal in size and structure. Venous: The inferior vena cava is dilated in size with greater than 50% respiratory variability, suggesting right atrial pressure of 8 mmHg. IAS/Shunts: No atrial level shunt detected by color flow Doppler.  LEFT VENTRICLE PLAX 2D LVIDd:         4.10 cm   Diastology LVIDs:         2.90 cm   LV e' medial:    5.22 cm/s LV PW:  1.20 cm   LV E/e' medial:  15.2 LV IVS:        1.40 cm   LV e' lateral:   5.87 cm/s LVOT diam:     1.90 cm   LV E/e' lateral: 13.6 LV SV:         48 LV SV Index:   27 LVOT Area:     2.84 cm?  RIGHT VENTRICLE             IVC RV Basal diam:  3.80 cm     IVC diam: 2.30 cm RV S prime:     14.90 cm/s TAPSE (M-mode): 1.5 cm LEFT ATRIUM             Index        RIGHT ATRIUM           Index LA diam:        4.20 cm 2.39 cm/m?   RA Area:     13.70 cm? LA Vol (A2C):   43.6 ml 24.85 ml/m?  RA Volume:   34.60 ml  19.72 ml/m? LA Vol (A4C):   50.0 ml 28.49 ml/m? LA Biplane Vol: 48.5 ml 27.64 ml/m?  AORTIC VALVE LVOT Vmax:   100.35 cm/s LVOT Vmean:  66.000 cm/s LVOT VTI:    0.168 m  AORTA Ao Root diam: 3.60 cm Ao Asc diam:  3.20 cm MITRAL VALVE MV Area (PHT): 2.62 cm?    SHUNTS MV Decel Time: 289 msec    Systemic VTI:  0.17 m MV E velocity: 79.60 cm/s  Systemic Diam: 1.90 cm MV A velocity: 96.70 cm/s MV E/A ratio:  0.82 Kathlyn Sacramento MD Electronically signed by Kathlyn Sacramento MD Signature Date/Time: 02/10/2022/9:26:54 AM    Final   ? ?CT CHEST ABDOMEN PELVIS WO CONTRAST ? ?Result Date: 02/09/2022 ?CLINICAL DATA:  Abdominal pain, short of breath, recent diagnosis of peritonitis, weight gain EXAM: CT CHEST, ABDOMEN AND PELVIS WITHOUT  CONTRAST TECHNIQUE: Multidetector CT imaging of the chest, abdomen and pelvis was performed following the standard protocol without IV contrast. RADIATION DOSE REDUCTION: This exam was performed according t

## 2022-02-11 NOTE — Progress Notes (Signed)
Pt critical sodium of 2.7, increase from previous of 2.5.  ?

## 2022-02-11 NOTE — Progress Notes (Signed)
?PROGRESS NOTE ? ? ? ?Andrea Stewart  YKZ:[id number] DOB: Apr 08, 1946 DOA: 02/09/2022 ?PCP: Rusty Aus, MD  ?[phone] ? ? ?Assessment & Plan: ?  ?Principal Problem: ?  Atrial fibrillation with rapid ventricular response ([place]) ?Active Problems: ?  T2DM (type 2 diabetes mellitus) ([place]) ?  Essential hypertension ?  Single kidney ?  COPD (chronic obstructive pulmonary disease) with chronic bronchitis ([place]) ?  Morbid obesity ([place]) ?  PAD (peripheral artery disease) ([place]) ?  ESRD on peritoneal dialysis [facility]) ? ? ?Andrea Stewart is a 76 y.o. female with medical history significant for morbid obesity, esrd on peritoneal dialysis, PAD, COPD, who presented with abdominal pain, vomiting, diarrhea. ?  ?She says she was treated for c diff colitis last month and for peritoneal fluid infection earlier this month, both by her nephrologist. She says she has developed diffuse abdominal pain for the past three days along with dyspnea, vomiting, and non-bloody diarrhea. ? ?Found to be in a fib with rvr. Started on diltiazim gtt after bolus failed to improve HR.  HR controlled and dilt gtt weaned off by midnight. ?  ?# Abdominal pain with concern for peritonitis ?Treated for e faecalis peritonitis due to Enterococcus faecalis 3 weeks ago. Now with new abd pain, vomiting/diarrhea ?- peritoneal fluid sent for analysis/culture ?- ID consulted.  Clinically, pt does not appear septic and symptom improved without abx tx ?Plan: ?--start empiric vanc/cefazidime pending cx result ? ?# Diarrhea ?Recently treated for c diff.  No acute GI findings on ct c/a/p.  Started on fidaxomicin  ?--C diff and GI path both neg, fidaxomicin d/c'ed. ?--pt does not want Imodium as 1 dose gives her constipation ?Plan: ?--cont fibercon for stool bulking  ? ?# A fib with rvr ?Patient says she has a hx of this but I don't see mention of it in her most recent cardiology progress note.  Med rec noted not taking metop.  HR controlled on dilt gtt. ?  ?#  Dyspnea 2/2 ?Pulm edema 2/2 ?dCHF exacerbation ?--Echo normal LVEF, grade I DD. ?--started on IV lasix 80 mg daily ?Plan: ?--cont IV lasix 80 mg daily ?  ?# DM2, poorly controlled with Hyperglycemia ?On presentation, glucose very elevated to 500s, no signs dka. ?--pt has been having intermittent hypoglycemia on glargine (40u then 35u) ?--order home Novolin as 18u BID ?--SSI ?  ?# Lactic acidosis, resolved ?2/2 all the above ? ?# Hypokalemia ?# Hypomagnesemia ?--monitor and replete PRN ?  ?# Chronic pain ?--cont home Norco PRN ?  ?# GAD ?- home xanax PRN, amitryptyline ?  ?# COPD ?- home dulera ? ? ?DVT prophylaxis: Heparin SQ ?Code Status: Full code  ?Family Communication: husband updated at bedside today ?Level of care: Progressive ?Dispo:   ?The patient is from: home ?Anticipated d/c is to: home ?Anticipated d/c date is: 2-3 days ?Patient currently is not medically ready to d/c due to: pending workup for peritonitis, on empiric IV abx ? ? ?Subjective and Interval History:  ?Pt reported abdominal pain after eating. ? ? ?Objective: ?Vitals:  ? 02/11/22 0741 02/11/22 1140 02/11/22 1539 02/11/22 2000  ?BP: 129/65 135/81 138/90 130/67  ?Pulse: 92 85 100 77  ?Resp: (!) 22 (!) 24 (!) 24 (!) 23  ?Temp: 97.9 ?F (36.6 ?C) 98 ?F (36.7 ?C) 97.8 ?F (36.6 ?C) (!) 97.5 ?F (36.4 ?C)  ?TempSrc: Oral Oral Oral Oral  ?SpO2: 96% 96% 96% 97%  ?Weight:      ?Height:      ? ? ?  Intake/Output Summary (Last 24 hours) at 02/11/2022 2041 ?Last data filed at 02/11/2022 1841 ?Gross per 24 hour  ?Intake 11356.39 ml  ?Output 10173 ml  ?Net 1183.39 ml  ? ?Filed Weights  ? 02/09/22 1946  ?Weight: 91.1 kg  ? ? ?Examination:  ? ?Constitutional: NAD, AAOx3 ?HEENT: conjunctivae and lids normal, EOMI ?CV: No cyanosis.   ?RESP: normal respiratory effort, on RA ?Extremities: No effusions, edema in BLE ?SKIN: warm, dry ?Neuro: II - XII grossly intact.   ?Psych: Normal mood and affect.  Appropriate judgement and reason ? ? ?Data Reviewed: I have personally  reviewed following labs and imaging studies ? ?CBC: ?Recent Labs  ?Lab 02/09/22 ?1321 02/10/22 ?0310 02/11/22 ?0440  ?WBC 8.6 9.4 6.2  ?HGB 10.8* 9.5* 9.5*  ?HCT 34.0* 29.3* 29.4*  ?MCV 94.7 93.6 94.8  ?PLT 184 261 258  ? ?Basic Metabolic Panel: ?Recent Labs  ?Lab 02/09/22 ?1321 02/09/22 ?1956 02/10/22 ?0310 02/11/22 ?0440  ?NA 132* 133* 135 134*  ?K 3.0* 2.3* 2.5* 2.7*  ?CL 92* 95* 98 99  ?CO2 26 28 29 28   ?GLUCOSE 537* 313* 176* 285*  ?BUN 19 19 20 18   ?CREATININE 3.61* 3.70* 3.82* 3.53*  ?CALCIUM 7.9* 7.9* 7.9* 7.9*  ?MG 1.1*  --  2.2 1.8  ? ?GFR: ?Estimated Creatinine Clearance: 13.4 mL/min (A) (by C-G formula based on SCr of 3.53 mg/dL (H)). ?Liver Function Tests: ?Recent Labs  ?Lab 02/09/22 ?1321 02/10/22 ?0310  ?AST 30 16  ?ALT 14 13  ?ALKPHOS 95 76  ?BILITOT 1.4* 0.6  ?PROT 6.2* 5.4*  ?ALBUMIN 2.2* 1.8*  ? ?Recent Labs  ?Lab 02/09/22 ?1321  ?LIPASE 19  ? ?No results for input(s): AMMONIA in the last 168 hours. ?Coagulation Profile: ?Recent Labs  ?Lab 02/09/22 ?1321  ?INR 1.0  ? ?Cardiac Enzymes: ?No results for input(s): CKTOTAL, CKMB, CKMBINDEX, TROPONINI in the last 168 hours. ?BNP (last 3 results) ?No results for input(s): PROBNP in the last 8760 hours. ?HbA1C: ?Recent Labs  ?  02/09/22 ?1958  ?HGBA1C 11.2*  ? ?CBG: ?Recent Labs  ?Lab 02/11/22 ?1140 02/11/22 ?1639 02/11/22 ?1701 02/11/22 ?1744 02/11/22 ?2035  ?GLUCAP 97 46* 42* 149* 173*  ? ?Lipid Profile: ?No results for input(s): CHOL, HDL, LDLCALC, TRIG, CHOLHDL, LDLDIRECT in the last 72 hours. ?Thyroid Function Tests: ?Recent Labs  ?  02/09/22 ?1519  ?TSH 2.824  ? ?Anemia Panel: ?No results for input(s): VITAMINB12, FOLATE, FERRITIN, TIBC, IRON, RETICCTPCT in the last 72 hours. ?Sepsis Labs: ?Recent Labs  ?Lab 02/09/22 ?1321 02/09/22 ?1519 02/10/22 ?0501  ?LATICACIDVEN 2.7* 2.7* 1.1  ? ? ?Recent Results (from the past 240 hour(s))  ?Blood Culture (routine x 2)     Status: None (Preliminary result)  ? Collection Time: 02/09/22  1:22 PM  ? Specimen:  BLOOD  ?Result Value Ref Range Status  ? Specimen Description BLOOD BLOOD LEFT FOREARM  Final  ? Special Requests   Final  ?  BOTTLES DRAWN AEROBIC AND ANAEROBIC Blood Culture results may not be optimal due to an inadequate volume of blood received in culture bottles  ? Culture   Final  ?  NO GROWTH 2 DAYS ?Performed at [facility], [address] [place], [place] [address] ?  ? Report Status PENDING  Incomplete  ?Blood Culture (routine x 2)     Status: None (Preliminary result)  ? Collection Time: 02/09/22  1:22 PM  ? Specimen: BLOOD  ?Result Value Ref Range Status  ? Specimen Description BLOOD BLOOD RIGHT HAND  Final  ? Special Requests   Final  ?  BOTTLES DRAWN AEROBIC AND ANAEROBIC Blood Culture results may not be optimal due to an inadequate volume of blood received in culture bottles  ? Culture   Final  ?  NO GROWTH 2 DAYS ?Performed at [facility], [address], [place], [place] [address] ?  ? Report Status PENDING  Incomplete  ?Resp Panel by RT-PCR (Flu A&B, Covid) Nasopharyngeal Swab     Status: None  ? Collection Time: 02/09/22  1:22 PM  ? Specimen: Nasopharyngeal Swab; Nasopharyngeal(NP) swabs in vial transport medium  ?Result Value Ref Range Status  ? SARS Coronavirus 2 by RT PCR NEGATIVE NEGATIVE Final  ?  Comment: (NOTE) ?SARS-CoV-2 target nucleic acids are NOT DETECTED. ? ?The SARS-CoV-2 RNA is generally detectable in upper respiratory ?specimens during the acute phase of infection. The lowest ?concentration of SARS-CoV-2 viral copies this assay can detect is ?138 copies/mL. A negative result does not preclude SARS-Cov-2 ?infection and should not be used as the sole basis for treatment or ?other patient management decisions. A negative result may occur with  ?improper specimen collection/handling, submission of specimen other ?than nasopharyngeal swab, presence of viral mutation(s) within the ?areas targeted by this assay, and inadequate number of viral ?copies(<138  copies/mL). A negative result must be combined with ?clinical observations, patient history, and epidemiological ?information. The expected result is Negative. ? ?Fact Sheet for Patients:  ?[email or web address]

## 2022-02-11 NOTE — Progress Notes (Signed)
Disconnected from peritoneal cycler without complications. Manual drain completed twice with no extra fluid removal. Patient with -227 removal. Effluent yellow/slightly cloudy with no fibrin noted.Dr.Singh and Roque Cash made aware of negative uf. ?

## 2022-02-11 NOTE — Consult Note (Signed)
Pharmacy Antibiotic Note ? ?Andrea Stewart is a 76 y.o. female admitted on 02/09/2022 with  peritonitis .  Pharmacy has been consulted for ceftazidime and vancomycin dosing. ? ?Plan: ?Pt received vancomycin 1500 mg x 1. Will order a vancomycin level with AM labs and dose more if needed.  ? ?Will start ceftazidime 1 g q24H.  ? ?Pt is on PD.  ? ?Height: 4\' 11"  (149.9 cm) ?Weight: 91.1 kg (200 lb 13.4 oz) ?IBW/kg (Calculated) : 43.2 ? ?Temp (24hrs), Avg:98 ?F (36.7 ?C), Min:97.5 ?F (36.4 ?C), Max:98.7 ?F (37.1 ?C) ? ?Recent Labs  ?Lab 02/09/22 ?1321 02/09/22 ?1519 02/09/22 ?1956 02/10/22 ?0310 02/10/22 ?0501 02/11/22 ?0440  ?WBC 8.6  --   --  9.4  --  6.2  ?CREATININE 3.61*  --  3.70* 3.82*  --  3.53*  ?LATICACIDVEN 2.7* 2.7*  --   --  1.1  --   ?  ?Estimated Creatinine Clearance: 13.4 mL/min (A) (by C-G formula based on SCr of 3.53 mg/dL (H)).   ? ?Allergies  ?Allergen Reactions  ? Gabapentin Other (See Comments)  ?  intolerance ?intolerance ?"makes me crazy" ?"makes me crazy" ?  ? Morphine Anaphylaxis  ? Penicillins Swelling and Other (See Comments)  ?  Remote reaction as a child to Pen V K. Patient has tolerated augmentin and cefazolin  ?REACTION: rash, swelling.  ? ? ?  ? Prednisone Other (See Comments)  ?  "makes me think I am having a heart attack"  ? Hydralazine Itching  ? Nsaids Diarrhea  ? ? ? ?Thank you for allowing pharmacy to be a part of this patient?s care. ? ?Oswald Hillock, PharmD, BCPS ?02/11/2022 8:55 PM ? ?

## 2022-02-12 DIAGNOSIS — E876 Hypokalemia: Secondary | ICD-10-CM

## 2022-02-12 DIAGNOSIS — R739 Hyperglycemia, unspecified: Secondary | ICD-10-CM

## 2022-02-12 DIAGNOSIS — T8571XA Infection and inflammatory reaction due to peritoneal dialysis catheter, initial encounter: Secondary | ICD-10-CM | POA: Diagnosis not present

## 2022-02-12 DIAGNOSIS — K659 Peritonitis, unspecified: Secondary | ICD-10-CM

## 2022-02-12 DIAGNOSIS — Z96653 Presence of artificial knee joint, bilateral: Secondary | ICD-10-CM

## 2022-02-12 DIAGNOSIS — I4891 Unspecified atrial fibrillation: Secondary | ICD-10-CM | POA: Diagnosis not present

## 2022-02-12 DIAGNOSIS — I509 Heart failure, unspecified: Secondary | ICD-10-CM

## 2022-02-12 DIAGNOSIS — Z905 Acquired absence of kidney: Secondary | ICD-10-CM

## 2022-02-12 LAB — CBC
HCT: 29.7 % — ABNORMAL LOW (ref 36.0–46.0)
Hemoglobin: 9.5 g/dL — ABNORMAL LOW (ref 12.0–15.0)
MCH: 30.8 pg (ref 26.0–34.0)
MCHC: 32 g/dL (ref 30.0–36.0)
MCV: 96.4 fL (ref 80.0–100.0)
Platelets: 279 10*3/uL (ref 150–400)
RBC: 3.08 MIL/uL — ABNORMAL LOW (ref 3.87–5.11)
RDW: 13.6 % (ref 11.5–15.5)
WBC: 6.8 10*3/uL (ref 4.0–10.5)
nRBC: 0 % (ref 0.0–0.2)

## 2022-02-12 LAB — BASIC METABOLIC PANEL
Anion gap: 6 (ref 5–15)
BUN: 19 mg/dL (ref 8–23)
CO2: 29 mmol/L (ref 22–32)
Calcium: 8 mg/dL — ABNORMAL LOW (ref 8.9–10.3)
Chloride: 100 mmol/L (ref 98–111)
Creatinine, Ser: 3.64 mg/dL — ABNORMAL HIGH (ref 0.44–1.00)
GFR, Estimated: 12 mL/min — ABNORMAL LOW (ref >60)
Glucose, Bld: 329 mg/dL — ABNORMAL HIGH (ref 70–99)
Potassium: 3 mmol/L — ABNORMAL LOW (ref 3.5–5.1)
Sodium: 135 mmol/L (ref 135–145)

## 2022-02-12 LAB — GLUCOSE, CAPILLARY
Glucose-Capillary: 188 mg/dL — ABNORMAL HIGH (ref 70–99)
Glucose-Capillary: 100 mg/dL — ABNORMAL HIGH (ref 70–99)
Glucose-Capillary: 48 mg/dL — ABNORMAL LOW (ref 70–99)
Glucose-Capillary: 152 mg/dL — ABNORMAL HIGH (ref 70–99)
Glucose-Capillary: 48 mg/dL — ABNORMAL LOW (ref 70–99)
Glucose-Capillary: 221 mg/dL — ABNORMAL HIGH (ref 70–99)

## 2022-02-12 LAB — VANCOMYCIN, RANDOM: Vancomycin Rm: 22

## 2022-02-12 LAB — MAGNESIUM: Magnesium: 1.8 mg/dL (ref 1.7–2.4)

## 2022-02-12 MED ORDER — METOPROLOL SUCCINATE ER 25 MG PO TB24
12.5000 mg | ORAL_TABLET | Freq: Every day | ORAL | Status: DC
  Administered 2022-02-12 – 2022-02-14 (×3): 12.5 mg via ORAL
  Filled 2022-02-12 (×3): qty 1

## 2022-02-12 MED ORDER — DEXTROSE 50 % IV SOLN
25.0000 mL | Freq: Once | INTRAVENOUS | Status: AC
  Administered 2022-02-12: 25 mL via INTRAVENOUS
  Filled 2022-02-12: qty 50

## 2022-02-12 MED ORDER — POTASSIUM CHLORIDE CRYS ER 20 MEQ PO TBCR
40.0000 meq | EXTENDED_RELEASE_TABLET | ORAL | Status: AC
  Administered 2022-02-12 (×2): 40 meq via ORAL
  Filled 2022-02-12 (×2): qty 2

## 2022-02-12 MED ORDER — FUROSEMIDE 40 MG PO TABS
80.0000 mg | ORAL_TABLET | Freq: Every day | ORAL | Status: DC
  Administered 2022-02-13 – 2022-02-14 (×2): 80 mg via ORAL
  Filled 2022-02-12 (×2): qty 2

## 2022-02-12 MED ORDER — SPIRONOLACTONE 25 MG PO TABS: 25.0000 mg | ORAL_TABLET | Freq: Every day | ORAL | Status: DC

## 2022-02-12 MED ORDER — HYDROXYZINE HCL 25 MG PO TABS
25.0000 mg | ORAL_TABLET | Freq: Three times a day (TID) | ORAL | Status: DC | PRN
  Administered 2022-02-13: 25 mg via ORAL
  Filled 2022-02-12 (×2): qty 1

## 2022-02-12 MED ORDER — LOPERAMIDE HCL 2 MG PO CAPS
2.0000 mg | ORAL_CAPSULE | Freq: Once | ORAL | Status: AC
  Administered 2022-02-12: 2 mg via ORAL
  Filled 2022-02-12: qty 1

## 2022-02-12 MED ORDER — INSULIN DETEMIR 100 UNIT/ML ~~LOC~~ SOLN
18.0000 [IU] | Freq: Every day | SUBCUTANEOUS | Status: DC
  Filled 2022-02-12 (×2): qty 0.18

## 2022-02-12 MED ORDER — APIXABAN 5 MG PO TABS
5.0000 mg | ORAL_TABLET | Freq: Two times a day (BID) | ORAL | Status: DC
Start: 2022-02-12 — End: 2022-02-14
  Administered 2022-02-12 – 2022-02-14 (×5): 5 mg via ORAL
  Filled 2022-02-12 (×5): qty 1

## 2022-02-12 MED ORDER — INSULIN ASPART 100 UNIT/ML IJ SOLN
0.0000 [IU] | Freq: Three times a day (TID) | INTRAMUSCULAR | Status: DC
  Administered 2022-02-13: 2 [IU] via SUBCUTANEOUS
  Filled 2022-02-12: qty 1

## 2022-02-12 MED ORDER — VANCOMYCIN VARIABLE DOSE PER UNSTABLE RENAL FUNCTION (PHARMACIST DOSING)
Status: DC
Start: 2022-02-12 — End: 2022-02-14

## 2022-02-12 NOTE — Progress Notes (Addendum)
?PROGRESS NOTE ? ? ? ?Andrea Stewart  IRS:[id number] DOB: 04/25/1946 DOA: 02/09/2022 ?PCP: Rusty Aus, MD  ?[phone] ? ? ?Assessment & Plan: ?  ?Principal Problem: ?  Atrial fibrillation with rapid ventricular response ([place]) ?Active Problems: ?  T2DM (type 2 diabetes mellitus) ([place]) ?  Essential hypertension ?  Single kidney ?  COPD (chronic obstructive pulmonary disease) with chronic bronchitis ([place]) ?  Morbid obesity ([place]) ?  PAD (peripheral artery disease) ([place]) ?  ESRD on peritoneal dialysis [facility]) ? ? ?Andrea Stewart is a 76 y.o. female with medical history significant for morbid obesity, esrd on peritoneal dialysis, PAD, COPD, who presented with abdominal pain, vomiting, diarrhea. ?  ?She says she was treated for c diff colitis last month and for peritoneal fluid infection earlier this month, both by her nephrologist. She says she has developed diffuse abdominal pain for the past three days along with dyspnea, vomiting, and non-bloody diarrhea. ? ?Found to be in a fib with rvr. Started on diltiazim gtt after bolus failed to improve HR.  HR controlled and dilt gtt weaned off by midnight. ?  ?# Abdominal pain with concern for peritonitis ?Treated for e faecalis peritonitis due to Enterococcus faecalis 3 weeks ago. Now with new abd pain, vomiting/diarrhea ?- peritoneal fluid sent for analysis/culture ?- ID consulted.  Clinically, pt does not appear septic and symptom improved without abx tx.  However due to concern, pt started on empiric vanc/ceftazidime on 3/30 ?Plan: ?--cont vanc and ceftazidime pending cx result ? ?# Diarrhea ?Recently treated for c diff.  No acute GI findings on ct c/a/p.  Started on fidaxomicin  ?--C diff and GI path both neg, fidaxomicin d/c'ed. ?--pt does not want Imodium as 1 dose gives her constipation ?Plan: ?--cont fibercon for stool bulking  ?--Imodium x1 today ? ?# Afib w RVR ?Patient says she has a hx of this but I don't see mention of it in her most recent cardiology  progress note.  Med rec noted not taking metop.  HR controlled on dilt gtt. ?--went into Afib again this morning, though rate acceptable around 110's.  Discussed with Dr. Rockey Situ, will manage without formal cardio consult. ?Plan: ?--resume home Toprol 12.5 mg daily ?--If HR becomes persistently more elevated, can start dilt gtt ?--start Eliquis ?  ?# Dyspnea 2/2 ?Pulm edema 2/2 ?dCHF exacerbation ?--Echo normal LVEF, grade I DD. ?--started on IV lasix 80 mg daily ?Plan: ?--cont IV lasix 80 mg daily today ?--resume home oral lasix and aldactone tomorrow ?  ?# DM2, poorly controlled with Hyperglycemia ?On presentation, glucose very elevated to 500s, no signs dka. ?--pt has been having intermittent hypoglycemia on glargine, always around late afternoon ?--decrease Levemir to 18u nightly ?--SSI decreased to mod scale ?  ?# Lactic acidosis, resolved ?2/2 all the above ? ?# Hypokalemia, persistent ?# Hypomagnesemia ?--monitor and replete with oral potassium, and IV mag ?  ?# Chronic pain ?--cont home Norco PRN ?  ?# GAD ?- home xanax PRN, amitryptyline ?  ?# COPD ?- home dulera ? ? ?DVT prophylaxis: Heparin SQ ?Code Status: Full code  ?Family Communication: husband updated at bedside today ?Level of care: Progressive ?Dispo:   ?The patient is from: home ?Anticipated d/c is to: home ?Anticipated d/c date is: 2-3 days ?Patient currently is not medically ready to d/c due to: pending workup for peritonitis, on empiric IV abx ? ? ?Subjective and Interval History:  ?Pt continued to have diarrhea with starting abx.   ? ?Pt also  went back into Afib this morning, though rate ~110's. ? ? ?Objective: ?Vitals:  ? 02/12/22 1133 02/12/22 1646 02/12/22 1708 02/12/22 1720  ?BP: (!) 166/85 (!) 156/103    ?Pulse: 66 (!) 105    ?Resp: 16 20 (!) 23 (!) 27  ?Temp: 98.2 ?F (36.8 ?C) 97.8 ?F (36.6 ?C)  97.7 ?F (36.5 ?C)  ?TempSrc: Oral   Oral  ?SpO2: 95% 97%    ?Weight:   88.6 kg   ?Height:      ? ? ?Intake/Output Summary (Last 24 hours) at  02/12/2022 1904 ?Last data filed at 02/12/2022 1715 ?Gross per 24 hour  ?Intake 10480 ml  ?Output 11482 ml  ?Net -1002 ml  ? ?Filed Weights  ? 02/11/22 2118 02/12/22 0700 02/12/22 1708  ?Weight: 87.9 kg 85.4 kg 88.6 kg  ? ? ?Examination:  ? ?Constitutional: NAD, AAOx3, sitting on bedside commode ?HEENT: conjunctivae and lids normal, EOMI ?CV: No cyanosis.   ?RESP: normal respiratory effort, on RA ?SKIN: warm, dry ?Neuro: II - XII grossly intact.   ? ? ?Data Reviewed: I have personally reviewed following labs and imaging studies ? ?CBC: ?Recent Labs  ?Lab 02/09/22 ?1321 02/10/22 ?0310 02/11/22 ?9476 02/12/22 ?0427  ?WBC 8.6 9.4 6.2 6.8  ?HGB 10.8* 9.5* 9.5* 9.5*  ?HCT 34.0* 29.3* 29.4* 29.7*  ?MCV 94.7 93.6 94.8 96.4  ?PLT 184 261 258 279  ? ?Basic Metabolic Panel: ?Recent Labs  ?Lab 02/09/22 ?1321 02/09/22 ?1956 02/10/22 ?0310 02/11/22 ?5465 02/12/22 ?0427  ?NA 132* 133* 135 134* 135  ?K 3.0* 2.3* 2.5* 2.7* 3.0*  ?CL 92* 95* 98 99 100  ?CO2 26 28 29 28 29   ?GLUCOSE 537* 313* 176* 285* 329*  ?BUN 19 19 20 18 19   ?CREATININE 3.61* 3.70* 3.82* 3.53* 3.64*  ?CALCIUM 7.9* 7.9* 7.9* 7.9* 8.0*  ?MG 1.1*  --  2.2 1.8 1.8  ? ?GFR: ?Estimated Creatinine Clearance: 12.7 mL/min (A) (by C-G formula based on SCr of 3.64 mg/dL (H)). ?Liver Function Tests: ?Recent Labs  ?Lab 02/09/22 ?1321 02/10/22 ?0310  ?AST 30 16  ?ALT 14 13  ?ALKPHOS 95 76  ?BILITOT 1.4* 0.6  ?PROT 6.2* 5.4*  ?ALBUMIN 2.2* 1.8*  ? ?Recent Labs  ?Lab 02/09/22 ?1321  ?LIPASE 19  ? ?No results for input(s): AMMONIA in the last 168 hours. ?Coagulation Profile: ?Recent Labs  ?Lab 02/09/22 ?1321  ?INR 1.0  ? ?Cardiac Enzymes: ?No results for input(s): CKTOTAL, CKMB, CKMBINDEX, TROPONINI in the last 168 hours. ?BNP (last 3 results) ?No results for input(s): PROBNP in the last 8760 hours. ?HbA1C: ?Recent Labs  ?  02/09/22 ?1958  ?HGBA1C 11.2*  ? ?CBG: ?Recent Labs  ?Lab 02/12/22 ?0831 02/12/22 ?1131 02/12/22 ?1616 02/12/22 ?1640 02/12/22 ?1726  ?GLUCAP 188* 100* 48* 48*  152*  ? ?Lipid Profile: ?No results for input(s): CHOL, HDL, LDLCALC, TRIG, CHOLHDL, LDLDIRECT in the last 72 hours. ?Thyroid Function Tests: ?No results for input(s): TSH, T4TOTAL, FREET4, T3FREE, THYROIDAB in the last 72 hours. ? ?Anemia Panel: ?No results for input(s): VITAMINB12, FOLATE, FERRITIN, TIBC, IRON, RETICCTPCT in the last 72 hours. ?Sepsis Labs: ?Recent Labs  ?Lab 02/09/22 ?1321 02/09/22 ?1519 02/10/22 ?0501  ?LATICACIDVEN 2.7* 2.7* 1.1  ? ? ?Recent Results (from the past 240 hour(s))  ?Blood Culture (routine x 2)     Status: None (Preliminary result)  ? Collection Time: 02/09/22  1:22 PM  ? Specimen: BLOOD  ?Result Value Ref Range Status  ? Specimen Description BLOOD BLOOD LEFT FOREARM  Final  ?  Special Requests   Final  ?  BOTTLES DRAWN AEROBIC AND ANAEROBIC Blood Culture results may not be optimal due to an inadequate volume of blood received in culture bottles  ? Culture   Final  ?  NO GROWTH 3 DAYS ?Performed at [facility], [address] [place], [place] [address] ?  ? Report Status PENDING  Incomplete  ?Blood Culture (routine x 2)     Status: None (Preliminary result)  ? Collection Time: 02/09/22  1:22 PM  ? Specimen: BLOOD  ?Result Value Ref Range Status  ? Specimen Description BLOOD BLOOD RIGHT HAND  Final  ? Special Requests   Final  ?  BOTTLES DRAWN AEROBIC AND ANAEROBIC Blood Culture results may not be optimal due to an inadequate volume of blood received in culture bottles  ? Culture   Final  ?  NO GROWTH 3 DAYS ?Performed at [facility], [address] [place], [place] [address] ?  ? Report Status PENDING  Incomplete  ?Resp Panel by RT-PCR (Flu A&B, Covid) Nasopharyngeal Swab     Status: None  ? Collection Time: 02/09/22  1:22 PM  ? Specimen: Nasopharyngeal Swab; Nasopharyngeal(NP) swabs in vial transport medium  ?Result Value Ref Range Status  ? SARS Coronavirus 2 by RT PCR NEGATIVE NEGATIVE Final  ?  Comment: (NOTE) ?SARS-CoV-2 target nucleic acids are NOT  DETECTED. ? ?The SARS-CoV-2 RNA is generally detectable in upper respiratory ?specimens during the acute phase of infection. The lowest ?concentration of SARS-CoV-2 viral copies this assay can detect is ?138 copi

## 2022-02-12 NOTE — Consult Note (Signed)
ANTICOAGULATION CONSULT NOTE - Initial Consult ? ?Pharmacy Consult for apixaban ?Indication: atrial fibrillation ? ?Allergies  ?Allergen Reactions  ? Gabapentin Other (See Comments)  ?  intolerance ?intolerance ?"makes me crazy" ?"makes me crazy" ?  ? Morphine Anaphylaxis  ? Penicillins Swelling and Other (See Comments)  ?  Remote reaction as a child to Pen V K. Patient has tolerated augmentin and cefazolin  ?REACTION: rash, swelling.  ? ? ?  ? Prednisone Other (See Comments)  ?  "makes me think I am having a heart attack"  ? Hydralazine Itching  ? Nsaids Diarrhea  ? ? ?Patient Measurements: ?Height: 4\' 11"  (149.9 cm) ?Weight: 85.4 kg (188 lb 4.4 oz) (bedscale with covers off) ?IBW/kg (Calculated) : 43.2 ? ? ?Vital Signs: ?Temp: 98.2 ?F (36.8 ?C) (03/31 1133) ?Temp Source: Oral (03/31 1133) ?BP: 166/85 (03/31 1133) ?Pulse Rate: 66 (03/31 1133) ? ?Labs: ?Recent Labs  ?  02/09/22 ?1519 02/09/22 ?1956 02/10/22 ?0310 02/11/22 ?8295 02/12/22 ?0427  ?HGB  --    < > 9.5* 9.5* 9.5*  ?HCT  --   --  29.3* 29.4* 29.7*  ?PLT  --   --  261 258 279  ?CREATININE  --    < > 3.82* 3.53* 3.64*  ?TROPONINIHS 46*  --   --   --   --   ? < > = values in this interval not displayed.  ? ? ?Estimated Creatinine Clearance: 12.5 mL/min (A) (by C-G formula based on SCr of 3.64 mg/dL (H)). ? ? ?Medical History: ?Past Medical History:  ?Diagnosis Date  ? Backache, unspecified   ? s/p c-spine and l-spine fusions  ? COPD (chronic obstructive pulmonary disease) ([place])   ? Degeneration of intervertebral disc, site unspecified   ? Depressive disorder, not elsewhere classified   ? Disorder of bone and cartilage, unspecified   ? Family history of adverse reaction to anesthesia   ? mother did, but patient not sure of the complication  ? HTN (hypertension)   ? Hx of left heart catheterization by cutdown   ? a. 7 years ago; no significant cad; b. Lexiscan 2014: no significant ischemia, no significant EKG changes concerning for ischemia, EF 67%, overall low  risk study  ? Kidney failure   ? Dr Cyndia Diver  ? Myalgia and myositis, unspecified   ? Obesity, unspecified   ? Personal history of tobacco use, presenting hazards to health   ? 1/2 ppd  ? Plantar fascial fibromatosis   ? S/P cholecystectomy   ? S/p nephrectomy   ? Type II or unspecified type diabetes mellitus without mention of complication, not stated as uncontrolled   ? Unspecified essential hypertension   ? 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 ? ?Medications:  ?PTA: ASA 81 mg daily ? ?Assessment: ?76 y.o. female with medical history significant for morbid obesity, esrd on peritoneal dialysis, PAD, COPD, who presented with abdominal pain and severe electrolyte abnormalities 2/2 vomiting, diarrhea. Found to be in new onset Afib w/ RVR on admission. CHADsVaSc score of 5. Pharmacy has been consulted for new start of apixaban ? ? ?Goal of Therapy:  ?Monitor platelets by anticoagulation protocol: Yes ?  ?Plan:  ?Initiate Apixaban 5 mg BID for indication of Afib as patient age < 33 and weight > 60 kg ?CBC at least every 3 days while in patient  ? ?Dorothe Pea, PharmD, BCPS ?Clinical Pharmacist   ?02/12/2022,2:18 PM ? ? ?

## 2022-02-12 NOTE — Progress Notes (Signed)
Patient disconnected from peritoneal dialysis cycler. Upon arrival to patient room, cycler was alarming "low drain volume", patient was placed in manual drain. Total uf = -118 after manual drain, will make Dr.Singh and Colon Flattery, NP aware during morning round. Effluent clear/yellow with no noted fibrin. Per patient treatment was tolerated well.PD catheter capped and clamped. ?

## 2022-02-12 NOTE — Consult Note (Signed)
Pharmacy Antibiotic Note ? ?Andrea Stewart is a 76 y.o. female admitted on 02/09/2022 with  peritonitis .  Pharmacy has been consulted for ceftazidime and vancomycin dosing. Patient receives peritoneal dialysis.  She recently completed treatment with vancomycin intraperitoneal for E faecalis peritonitis from 01/14/2022 culture.   ? ?Today, 02/12/2022 ?Day #2 vanco/ceftazidime ?ESRD on Peritoneal dialysis ?WBC 6.8 ?Afebrile ?Peritoneal fluid: 1468 nucleated cells with 80% neutrophils ? ?Dosing ?3/30 Vancomycin 1500mg  x 1 at 16:25 ?3/31 Random vanco level = 22 at 0427 ? ?Plan: ?Vancomycin level remains above threshold to redose.  Since appears to have some residual function based on urine output, will recheck random vancomycin level tomorrow am.  Plan to redose when level approaches or thought to be about 15 mcg/mL ?Continue ceftazidime 1 g q24H.  ? ? ?Height: 4\' 11"  (149.9 cm) ?Weight: 85.4 kg (188 lb 4.4 oz) (bedscale with covers off) ?IBW/kg (Calculated) : 43.2 ? ?Temp (24hrs), Avg:98 ?F (36.7 ?C), Min:97.5 ?F (36.4 ?C), Max:98.4 ?F (36.9 ?C) ? ?Recent Labs  ?Lab 02/09/22 ?1321 02/09/22 ?1519 02/09/22 ?1956 02/10/22 ?0310 02/10/22 ?0501 02/11/22 ?0786 02/12/22 ?0427  ?WBC 8.6  --   --  9.4  --  6.2 6.8  ?CREATININE 3.61*  --  3.70* 3.82*  --  3.53* 3.64*  ?LATICACIDVEN 2.7* 2.7*  --   --  1.1  --   --   ?VANCORANDOM  --   --   --   --   --   --  22  ? ?  ?Estimated Creatinine Clearance: 12.5 mL/min (A) (by C-G formula based on SCr of 3.64 mg/dL (H)).   ? ?Allergies  ?Allergen Reactions  ? Gabapentin Other (See Comments)  ?  intolerance ?intolerance ?"makes me crazy" ?"makes me crazy" ?  ? Morphine Anaphylaxis  ? Penicillins Swelling and Other (See Comments)  ?  Remote reaction as a child to Pen V K. Patient has tolerated augmentin and cefazolin  ?REACTION: rash, swelling.  ? ? ?  ? Prednisone Other (See Comments)  ?  "makes me think I am having a heart attack"  ? Hydralazine Itching  ? Nsaids Diarrhea  ? ? ? ?Thank  you for allowing pharmacy to be a part of this patient?s care. ? ?Doreene Eland, PharmD, BCPS, BCIDP ?Work Cell: [phone] ?02/12/2022 11:05 AM ? ? ? ?

## 2022-02-12 NOTE — Care Management Important Message (Signed)
Important Message ? ?Patient Details  ?Name: Andrea Stewart ?MRN: [id number] ?Date of Birth: 02/19/1946 ? ? ?Medicare Important Message Given:  Yes ? ? ? ? ?Dannette Barbara ?02/12/2022, 11:26 AM ?

## 2022-02-12 NOTE — Progress Notes (Signed)
? ?Date of Admission:  02/09/2022   ? ? ?ID: Andrea Stewart is a 76 y.o. female  ?Principal Problem: ?  Atrial fibrillation with rapid ventricular response ([place]) ?Active Problems: ?  T2DM (type 2 diabetes mellitus) ([place]) ?  Essential hypertension ?  Single kidney ?  COPD (chronic obstructive pulmonary disease) with chronic bronchitis ([place]) ?  Morbid obesity ([place]) ?  PAD (peripheral artery disease) ([place]) ?  ESRD on peritoneal dialysis [facility]) ? ? ?Subjective ?Pt says she was doing well this monring but then her blood glucose dropped to 48 and she is not feeling good ? ?Medications:  ? ALPRAZolam  0.5 mg Oral QHS  ? furosemide  80 mg Intravenous Daily  ? gentamicin cream  1 application. Topical Daily  ? heparin  5,000 Units Subcutaneous Q8H  ? insulin aspart  0-20 Units Subcutaneous TID WC  ? insulin detemir  18 Units Subcutaneous BID  ? mometasone-formoterol  2 puff Inhalation BID  ? polycarbophil  625 mg Oral Daily  ? potassium chloride  40 mEq Oral Q4H  ? sodium chloride flush  3 mL Intravenous Q12H  ? spironolactone  25 mg Oral Daily  ? vancomycin variable dose per unstable renal function (pharmacist dosing)   Does not apply See admin instructions  ? ? ?Objective: ?Vital signs in last 24 hours: ?Temp:  [97.5 ?F (36.4 ?C)-98.4 ?F (36.9 ?C)] 98 ?F (36.7 ?C) (03/31 1157) ?Pulse Rate:  [56-100] 56 (03/31 0832) ?Resp:  [13-33] 18 (03/31 2620) ?BP: (130-173)/(67-101) 147/91 (03/31 3559) ?SpO2:  [96 %-97 %] 96 % (03/31 0832) ?Weight:  [85.4 kg-87.9 kg] 85.4 kg (03/31 0700) ? ?PHYSICAL EXAM:  ?General: awake, alert, no distress, pale ?Lungs: b/l air entry- crepts bases. ?Heart: irregular- rate controlled ?Abdomen: Soft, tender on palpation,not distended. Bowel sounds normal. No masses ?PD cath in place ?Extremities: atraumatic, no cyanosis. No edema. No clubbing ?Skin: No rashes or lesions. Or bruising ?Lymph: Cervical, supraclavicular normal. ?Neurologic: Grossly non-focal ? ?Lab Results ?Recent Labs  ?  02/11/22 ?7416  02/12/22 ?0427  ?WBC 6.2 6.8  ?HGB 9.5* 9.5*  ?HCT 29.4* 29.7*  ?NA 134* 135  ?K 2.7* 3.0*  ?CL 99 100  ?CO2 28 29  ?BUN 18 19  ?CREATININE 3.53* 3.64*  ? ?Liver Panel ?Recent Labs  ?  02/09/22 ?1321 02/10/22 ?0310  ?PROT 6.2* 5.4*  ?ALBUMIN 2.2* 1.8*  ?AST 30 16  ?ALT 14 13  ?ALKPHOS 95 76  ?BILITOT 1.4* 0.6  ? ? ?Microbiology: ?Peritoneal  fluid culture pending ?Studies/Results: ?No results found. ? ? ?Assessment/Plan: ?? ?Diarrhea.,Abdominal pain the patient with peritoneal dialysis catheter.  Also has some shortness of breath. ? ?Diarrhea which could be secondary to autonomic disturbance as the recent gastric intestinal panel was negative.  Cdiff trst also is negative ?  ?  ? ?Recently treated bacterial peritonitis due to Enterococcus faecalis. ?Repeat peritoneal fluid cell count is > 1000 ?Culture pending ?Discussed with nephrologist who wants this to be treated with IV antibiotics- so will start vanco and ceftazidime . Once culture results can de-escalate- if the peritonitis recurs she will have to get the PD cath out but she is not interested ?On discharge levaquin+ vanco is an option ?Will let Dr.Singh decide as vanco will have to be given IV ?  ?Hypokalemia ?  ?Past h/o cdiff in Nov 2022- toxin only positive  ? ? ?Afib/ CHF ? ? ?Hyperglycemia ? ?Bilateral TKA ? ?Left nephrectomy.  donor for her brother. ? ?Discussed the management  with patient and nephrologist ?ID will follow her peripherally this weekend- call if needed ?  ?

## 2022-02-12 NOTE — Progress Notes (Addendum)
?[facility]  ?ROUNDING NOTE  ? ?Subjective:  ? ?Andrea Stewart is a 76 y.o.female with a past medical concerns including COPD, hypertension, depression, diabetes and end stage renal disease. Patient presents to ED with complaints vomiting, diarrhea and abdominal pain. She has been admitted for Hypokalemia [E87.6] ?Lactic acidosis [E87.20] ?Hypomagnesemia [E83.42] ?Pleural effusion [J90] ?Hyperglycemia [R73.9] ?Atrial fibrillation with rapid ventricular response ([place]) [I48.91] ?Elevated brain natriuretic peptide (BNP) level [R79.89] ?Troponin I above reference range [R77.8] ?Atrial fibrillation with RVR ([place]) [I48.91] ?Chronic kidney disease, unspecified CKD stage [N18.9] ? ?Patient is known to our clinic and peritoneal dialysis treatments monitored at [facility] under Dr. Juleen China.   ? ?Patient seen laying in bed, states she feels tired ?Husband at bedside ?Tolerating meals but complains they give her diarrhea. ?Reports 3 episodes of diarrhea this morning ?Received peritoneal dialysis overnight, tolerated well ? ?Objective:  ?Vital signs in last 24 hours:  ?Temp:  [97.5 ?F (36.4 ?C)-98.4 ?F (36.9 ?C)] 98.2 ?F (36.8 ?C) (03/31 1133) ?Pulse Rate:  [56-100] 66 (03/31 1133) ?Resp:  [13-33] 16 (03/31 1133) ?BP: (130-173)/(67-101) 166/85 (03/31 1133) ?SpO2:  [95 %-97 %] 95 % (03/31 1133) ?Weight:  [85.4 kg-87.9 kg] 85.4 kg (03/31 0700) ? ?Weight change:  ?Filed Weights  ? 02/09/22 1946 02/11/22 2118 02/12/22 0700  ?Weight: 91.1 kg 87.9 kg 85.4 kg  ? ? ?Intake/Output: ?I/O last 3 completed shifts: ?In: 11356.4 [P.O.:1080; Other:10000; IV Piggyback:276.4] ?Out: 29937 [Urine:400; Other:9773] ?  ?Intake/Output this shift: ? Total I/O ?In: 10480 [P.O.:480; Other:10000] ?Out: 16967 [Urine:500; Other:9882] ? ?Physical Exam: ?General: NAD  ?Head: Normocephalic, atraumatic. Moist oral mucosal membranes  ?Eyes: Anicteric  ?Lungs:  Crackles and rhonchi, normal effort  ?Heart: Regular rate and rhythm   ?Abdomen:  Soft, nontender, obese, PD catheter  ?Extremities: 1+ peripheral edema.  ?Neurologic: Nonfocal, moving all four extremities  ?Skin: No lesions  ?Access: PD catheter  ? ? ?Basic Metabolic Panel: ?Recent Labs  ?Lab 02/09/22 ?1321 02/09/22 ?1956 02/10/22 ?0310 02/11/22 ?8938 02/12/22 ?0427  ?NA 132* 133* 135 134* 135  ?K 3.0* 2.3* 2.5* 2.7* 3.0*  ?CL 92* 95* 98 99 100  ?CO2 26 28 29 28 29   ?GLUCOSE 537* 313* 176* 285* 329*  ?BUN 19 19 20 18 19   ?CREATININE 3.61* 3.70* 3.82* 3.53* 3.64*  ?CALCIUM 7.9* 7.9* 7.9* 7.9* 8.0*  ?MG 1.1*  --  2.2 1.8 1.8  ? ? ? ?Liver Function Tests: ?Recent Labs  ?Lab 02/09/22 ?1321 02/10/22 ?0310  ?AST 30 16  ?ALT 14 13  ?ALKPHOS 95 76  ?BILITOT 1.4* 0.6  ?PROT 6.2* 5.4*  ?ALBUMIN 2.2* 1.8*  ? ? ?Recent Labs  ?Lab 02/09/22 ?1321  ?LIPASE 19  ? ? ?No results for input(s): AMMONIA in the last 168 hours. ? ?CBC: ?Recent Labs  ?Lab 02/09/22 ?1321 02/10/22 ?0310 02/11/22 ?1017 02/12/22 ?0427  ?WBC 8.6 9.4 6.2 6.8  ?HGB 10.8* 9.5* 9.5* 9.5*  ?HCT 34.0* 29.3* 29.4* 29.7*  ?MCV 94.7 93.6 94.8 96.4  ?PLT 184 261 258 279  ? ? ? ?Cardiac Enzymes: ?No results for input(s): CKTOTAL, CKMB, CKMBINDEX, TROPONINI in the last 168 hours. ? ?BNP: ?Invalid input(s): POCBNP ? ?CBG: ?Recent Labs  ?Lab 02/11/22 ?1701 02/11/22 ?1744 02/11/22 ?2035 02/12/22 ?5102 02/12/22 ?1131  ?GLUCAP 42* 149* 173* 188* 100*  ? ? ? ?Microbiology: ?Results for orders placed or performed during the hospital encounter of 02/09/22  ?Blood Culture (routine x 2)     Status: None (Preliminary result)  ? Collection  Time: 02/09/22  1:22 PM  ? Specimen: BLOOD  ?Result Value Ref Range Status  ? Specimen Description BLOOD BLOOD LEFT FOREARM  Final  ? Special Requests   Final  ?  BOTTLES DRAWN AEROBIC AND ANAEROBIC Blood Culture results may not be optimal due to an inadequate volume of blood received in culture bottles  ? Culture   Final  ?  NO GROWTH 3 DAYS ?Performed at [facility], [address] [place], [place]  [address] ?  ? Report Status PENDING  Incomplete  ?Blood Culture (routine x 2)     Status: None (Preliminary result)  ? Collection Time: 02/09/22  1:22 PM  ? Specimen: BLOOD  ?Result Value Ref Range Status  ? Specimen Description BLOOD BLOOD RIGHT HAND  Final  ? Special Requests   Final  ?  BOTTLES DRAWN AEROBIC AND ANAEROBIC Blood Culture results may not be optimal due to an inadequate volume of blood received in culture bottles  ? Culture   Final  ?  NO GROWTH 3 DAYS ?Performed at [facility], [address], [place], [place] [address] ?  ? Report Status PENDING  Incomplete  ?Resp Panel by RT-PCR (Flu A&B, Covid) Nasopharyngeal Swab     Status: None  ? Collection Time: 02/09/22  1:22 PM  ? Specimen: Nasopharyngeal Swab; Nasopharyngeal(NP) swabs in vial transport medium  ?Result Value Ref Range Status  ? SARS Coronavirus 2 by RT PCR NEGATIVE NEGATIVE Final  ?  Comment: (NOTE) ?SARS-CoV-2 target nucleic acids are NOT DETECTED. ? ?The SARS-CoV-2 RNA is generally detectable in upper respiratory ?specimens during the acute phase of infection. The lowest ?concentration of SARS-CoV-2 viral copies this assay can detect is ?138 copies/mL. A negative result does not preclude SARS-Cov-2 ?infection and should not be used as the sole basis for treatment or ?other patient management decisions. A negative result may occur with  ?improper specimen collection/handling, submission of specimen other ?than nasopharyngeal swab, presence of viral mutation(s) within the ?areas targeted by this assay, and inadequate number of viral ?copies(<138 copies/mL). A negative result must be combined with ?clinical observations, patient history, and epidemiological ?information. The expected result is Negative. ? ?Fact Sheet for Patients:  ?EntrepreneurPulse.com.au ? ?Fact Sheet for Healthcare Providers:  ?IncredibleEmployment.be ? ?This test is no t yet approved or cleared by the [place] FDA and   ?has been authorized for detection and/or diagnosis of SARS-CoV-2 by ?FDA under an Emergency Use Authorization (EUA). This EUA will remain  ?in effect (meaning this test can be used) for the duration of the ?COVID-19 declaration under Section 564(b)(1) of the Act, 21 ?U.S.C.section 360bbb-3(b)(1), unless the authorization is terminated  ?or revoked sooner.  ? ? ?  ? Influenza A by PCR NEGATIVE NEGATIVE Final  ? Influenza B by PCR NEGATIVE NEGATIVE Final  ?  Comment: (NOTE) ?The Xpert Xpress SARS-CoV-2/FLU/RSV plus assay is intended as an aid ?in the diagnosis of influenza from Nasopharyngeal swab specimens and ?should not be used as a sole basis for treatment. Nasal washings and ?aspirates are unacceptable for Xpert Xpress SARS-CoV-2/FLU/RSV ?testing. ? ?Fact Sheet for Patients: ?EntrepreneurPulse.com.au ? ?Fact Sheet for Healthcare Providers: ?IncredibleEmployment.be ? ?This test is not yet approved or cleared by the [place] FDA and ?has been authorized for detection and/or diagnosis of SARS-CoV-2 by ?FDA under an Emergency Use Authorization (EUA). This EUA will remain ?in effect (meaning this test can be used) for the duration of the ?COVID-19 declaration under Section 564(b)(1) of the Act,  21 U.S.C. ?section 360bbb-3(b)(1), unless the authorization is terminated or ?revoked. ? ?Performed at [facility], [place], ?[name] [address] ?  ?Body fluid culture w Gram Stain     Status: None (Preliminary result)  ? Collection Time: 02/09/22  2:09 PM  ? Specimen: Peritoneal Washings; Body Fluid  ?Result Value Ref Range Status  ? Specimen Description   Final  ?  PERITONEAL ?Performed at [facility], [address] [place], [place] [address] ?  ? Special Requests   Final  ?  Normal ?Performed at [facility], [address] [place], [place] [address] ?  ? Gram Stain   Final  ?  FEW WBC PRESENT, PREDOMINANTLY PMN ?NO ORGANISMS SEEN ?   ? Culture   Final  ?  CULTURE REINCUBATED FOR BETTER GROWTH ?Performed at [facility], [place] [address] [place], [place] [address] ?  ? Report Status PENDING  Incomplete  ?Urine Culture     Status: Abnorm

## 2022-02-13 DIAGNOSIS — I4891 Unspecified atrial fibrillation: Secondary | ICD-10-CM | POA: Diagnosis not present

## 2022-02-13 LAB — MAGNESIUM: Magnesium: 1.6 mg/dL — ABNORMAL LOW (ref 1.7–2.4)

## 2022-02-13 LAB — BASIC METABOLIC PANEL
Anion gap: 6 (ref 5–15)
BUN: 16 mg/dL (ref 8–23)
CO2: 30 mmol/L (ref 22–32)
Calcium: 8.4 mg/dL — ABNORMAL LOW (ref 8.9–10.3)
Chloride: 102 mmol/L (ref 98–111)
Creatinine, Ser: 3.29 mg/dL — ABNORMAL HIGH (ref 0.44–1.00)
GFR, Estimated: 14 mL/min — ABNORMAL LOW (ref >60)
Glucose, Bld: 222 mg/dL — ABNORMAL HIGH (ref 70–99)
Potassium: 3.6 mmol/L (ref 3.5–5.1)
Sodium: 138 mmol/L (ref 135–145)

## 2022-02-13 LAB — CBC
HCT: 29.3 % — ABNORMAL LOW (ref 36.0–46.0)
Hemoglobin: 9.2 g/dL — ABNORMAL LOW (ref 12.0–15.0)
MCH: 30.2 pg (ref 26.0–34.0)
MCHC: 31.4 g/dL (ref 30.0–36.0)
MCV: 96.1 fL (ref 80.0–100.0)
Platelets: 267 10*3/uL (ref 150–400)
RBC: 3.05 MIL/uL — ABNORMAL LOW (ref 3.87–5.11)
RDW: 13.8 % (ref 11.5–15.5)
WBC: 6.9 10*3/uL (ref 4.0–10.5)
nRBC: 0 % (ref 0.0–0.2)

## 2022-02-13 LAB — GLUCOSE, CAPILLARY
Glucose-Capillary: 133 mg/dL — ABNORMAL HIGH (ref 70–99)
Glucose-Capillary: 104 mg/dL — ABNORMAL HIGH (ref 70–99)
Glucose-Capillary: 169 mg/dL — ABNORMAL HIGH (ref 70–99)
Glucose-Capillary: 337 mg/dL — ABNORMAL HIGH (ref 70–99)

## 2022-02-13 LAB — VANCOMYCIN, RANDOM: Vancomycin Rm: 18

## 2022-02-13 MED ORDER — MAGNESIUM SULFATE 2 GM/50ML IV SOLN
2.0000 g | Freq: Once | INTRAVENOUS | Status: AC
  Administered 2022-02-13: 2 g via INTRAVENOUS
  Filled 2022-02-13: qty 50

## 2022-02-13 MED ORDER — ALPRAZOLAM 0.5 MG PO TABS
0.5000 mg | ORAL_TABLET | Freq: Every day | ORAL | Status: DC | PRN
  Administered 2022-02-13: 0.5 mg via ORAL
  Filled 2022-02-13: qty 1

## 2022-02-13 MED ORDER — INSULIN ASPART 100 UNIT/ML IJ SOLN
0.0000 [IU] | Freq: Three times a day (TID) | INTRAMUSCULAR | Status: DC
  Administered 2022-02-13: 11 [IU] via SUBCUTANEOUS
  Administered 2022-02-14: 8 [IU] via SUBCUTANEOUS
  Filled 2022-02-13 (×2): qty 1

## 2022-02-13 NOTE — Progress Notes (Signed)
2104: Patient refused Levemir 18u due to concerns for hypoglycemia that she has been having for the last couple of days. On-call provider notified.  ?

## 2022-02-13 NOTE — Progress Notes (Signed)
?PROGRESS NOTE ? ? ? ?Andrea Stewart  DPO:[id number] DOB: [birth year] DOA: 02/09/2022 ?PCP: Rusty Aus, MD  ?[id number] ? ? ?Assessment & Plan: ?  ?Principal Problem: ?  Atrial fibrillation with rapid ventricular response ([place]) ?Active Problems: ?  T2DM (type 2 diabetes mellitus) ([place]) ?  Essential hypertension ?  Single kidney ?  COPD (chronic obstructive pulmonary disease) with chronic bronchitis ([place]) ?  Morbid obesity ([place]) ?  PAD (peripheral artery disease) ([place]) ?  ESRD on peritoneal dialysis [facility]) ? ? ?Andrea Stewart is a 76 y.o. female with medical history significant for morbid obesity, esrd on peritoneal dialysis, PAD, COPD, who presented with abdominal pain, vomiting, diarrhea. ?  ?She says she was treated for c diff colitis last month and for peritoneal fluid infection earlier this month, both by her nephrologist. She says she has developed diffuse abdominal pain for the past three days along with dyspnea, vomiting, and non-bloody diarrhea. ? ?Found to be in a fib with rvr. Started on diltiazim gtt after bolus failed to improve HR.  HR controlled and dilt gtt weaned off by midnight. ?  ?# Abdominal pain with concern for peritonitis ?Treated for e faecalis peritonitis due to Enterococcus faecalis 3 weeks ago. Now with new abd pain, vomiting/diarrhea ?- peritoneal fluid sent for analysis/culture ?- ID consulted.  Clinically, pt does not appear septic and symptom improved without abx tx.  However due to concern, pt started on empiric vanc/ceftazidime on 3/30 ?Plan: ?--cont vanc and ceftazidime pending cx result ? ?# Diarrhea ?Recently treated for c diff.  No acute GI findings on ct c/a/p.  Started on fidaxomicin  ?--C diff and GI path both neg, fidaxomicin d/c'ed. ?--Imodium x1  ?Plan: ?--cont fibercon for stool bulking  ? ?# Afib w RVR ?Patient says she has a hx of this but I don't see mention of it in her most recent cardiology progress note.  Med rec noted not taking metop.  HR controlled on  dilt gtt. ?--went into Afib again this morning, though rate acceptable around 110's.  Discussed with Dr. Rockey Situ, will manage without formal cardio consult.  Resumed home Toprol 12.5 mg daily and started on eliquis ?Plan: ?--cont toprol ?--cont Eliquis ?--If HR becomes persistently more elevated, can start oral dilt 60 mg q6h ?  ?# Dyspnea 2/2 ?Pulm edema 2/2 ?dCHF exacerbation ?--Echo normal LVEF, grade I DD. ?--started on IV lasix 80 mg daily ?Plan: ?--transitioned back to home oral lasix 80 mg today ?--resume home Aldactone ?  ?# DM2, poorly controlled with Hyperglycemia ?# Hypoglycemia ?On presentation, glucose very elevated to 500s, no signs dka. ?--pt has been having intermittent hypoglycemia on glargine, always around late afternoon ?--Levemir to 18u nightly ?--SSI mod scale ?  ?# Lactic acidosis, resolved ?2/2 all the above ? ?# Hypokalemia, persistent ?# Hypomagnesemia ?--monitor and replete with oral potassium, and IV mag ?  ?# Chronic pain ?--cont home Norco PRN ?  ?# GAD ?- home xanax PRN, amitryptyline ?  ?# COPD ?- home dulera ? ? ?DVT prophylaxis: Heparin SQ ?Code Status: Full code  ?Family Communication: husband updated at bedside today ?Level of care: Progressive ?Dispo:   ?The patient is from: home ?Anticipated d/c is to: home ?Anticipated d/c date is: 1-2 days ?Patient currently is not medically ready to d/c due to: pending workup for peritonitis, on empiric IV abx ? ? ?Subjective and Interval History:  ?Pt reported diarrhea improved.   ? ? ?Objective: ?Vitals:  ? 02/13/22 0836 02/13/22 1132 02/13/22  1550 02/13/22 1601  ?BP: 140/73 (!) 142/83  (!) 163/68  ?Pulse:  65    ?Resp: 17 16 (!) 24 (!) 25  ?Temp: 98.3 ?F (36.8 ?C) 98.7 ?F (37.1 ?C)  97.9 ?F (36.6 ?C)  ?TempSrc: Oral   Oral  ?SpO2:  98%    ?Weight:    86.6 kg  ?Height:      ? ? ?Intake/Output Summary (Last 24 hours) at 02/13/2022 1623 ?Last data filed at 02/13/2022 1500 ?Gross per 24 hour  ?Intake 1508.51 ml  ?Output 10207 ml  ?Net -8698.49 ml   ? ?Filed Weights  ? 02/12/22 1708 02/13/22 0506 02/13/22 1601  ?Weight: 88.6 kg 88.4 kg 86.6 kg  ? ? ?Examination:  ? ?Constitutional: NAD, AAOx3 ?HEENT: conjunctivae and lids normal, EOMI ?CV: No cyanosis.   ?RESP: normal respiratory effort, on RA ?SKIN: warm, dry ?Neuro: II - XII grossly intact.   ?Psych: Normal mood and affect.  Appropriate judgement and reason ? ? ? ?Data Reviewed: I have personally reviewed following labs and imaging studies ? ?CBC: ?Recent Labs  ?Lab 02/09/22 ?1321 02/10/22 ?0310 02/11/22 ?7035 02/12/22 ?0093 02/13/22 ?0353  ?WBC 8.6 9.4 6.2 6.8 6.9  ?HGB 10.8* 9.5* 9.5* 9.5* 9.2*  ?HCT 34.0* 29.3* 29.4* 29.7* 29.3*  ?MCV 94.7 93.6 94.8 96.4 96.1  ?PLT 184 261 258 279 267  ? ?Basic Metabolic Panel: ?Recent Labs  ?Lab 02/09/22 ?1321 02/09/22 ?1956 02/10/22 ?0310 02/11/22 ?8182 02/12/22 ?9937 02/13/22 ?0353  ?NA 132* 133* 135 134* 135 138  ?K 3.0* 2.3* 2.5* 2.7* 3.0* 3.6  ?CL 92* 95* 98 99 100 102  ?CO2 26 28 29 28 29 30   ?GLUCOSE 537* 313* 176* 285* 329* 222*  ?BUN 19 19 20 18 19 16   ?CREATININE 3.61* 3.70* 3.82* 3.53* 3.64* 3.29*  ?CALCIUM 7.9* 7.9* 7.9* 7.9* 8.0* 8.4*  ?MG 1.1*  --  2.2 1.8 1.8 1.6*  ? ?GFR: ?Estimated Creatinine Clearance: 13.9 mL/min (A) (by C-G formula based on SCr of 3.29 mg/dL (H)). ?Liver Function Tests: ?Recent Labs  ?Lab 02/09/22 ?1321 02/10/22 ?0310  ?AST 30 16  ?ALT 14 13  ?ALKPHOS 95 76  ?BILITOT 1.4* 0.6  ?PROT 6.2* 5.4*  ?ALBUMIN 2.2* 1.8*  ? ?Recent Labs  ?Lab 02/09/22 ?1321  ?LIPASE 19  ? ?No results for input(s): AMMONIA in the last 168 hours. ?Coagulation Profile: ?Recent Labs  ?Lab 02/09/22 ?1321  ?INR 1.0  ? ?Cardiac Enzymes: ?No results for input(s): CKTOTAL, CKMB, CKMBINDEX, TROPONINI in the last 168 hours. ?BNP (last 3 results) ?No results for input(s): PROBNP in the last 8760 hours. ?HbA1C: ?No results for input(s): HGBA1C in the last 72 hours. ? ?CBG: ?Recent Labs  ?Lab 02/12/22 ?1640 02/12/22 ?1726 02/12/22 ?2104 02/13/22 ?0807 02/13/22 ?1132   ?GLUCAP 48* 152* 221* 133* 104*  ? ?Lipid Profile: ?No results for input(s): CHOL, HDL, LDLCALC, TRIG, CHOLHDL, LDLDIRECT in the last 72 hours. ?Thyroid Function Tests: ?No results for input(s): TSH, T4TOTAL, FREET4, T3FREE, THYROIDAB in the last 72 hours. ? ?Anemia Panel: ?No results for input(s): VITAMINB12, FOLATE, FERRITIN, TIBC, IRON, RETICCTPCT in the last 72 hours. ?Sepsis Labs: ?Recent Labs  ?Lab 02/09/22 ?1321 02/09/22 ?1519 02/10/22 ?0501  ?LATICACIDVEN 2.7* 2.7* 1.1  ? ? ?Recent Results (from the past 240 hour(s))  ?Blood Culture (routine x 2)     Status: None (Preliminary result)  ? Collection Time: 02/09/22  1:22 PM  ? Specimen: BLOOD  ?Result Value Ref Range Status  ? Specimen Description BLOOD BLOOD LEFT  FOREARM  Final  ? Special Requests   Final  ?  BOTTLES DRAWN AEROBIC AND ANAEROBIC Blood Culture results may not be optimal due to an inadequate volume of blood received in culture bottles  ? Culture   Final  ?  NO GROWTH 4 DAYS ?Performed at [facility], [address] [place], [place] [address] ?  ? Report Status PENDING  Incomplete  ?Blood Culture (routine x 2)     Status: None (Preliminary result)  ? Collection Time: 02/09/22  1:22 PM  ? Specimen: BLOOD  ?Result Value Ref Range Status  ? Specimen Description BLOOD BLOOD RIGHT HAND  Final  ? Special Requests   Final  ?  BOTTLES DRAWN AEROBIC AND ANAEROBIC Blood Culture results may not be optimal due to an inadequate volume of blood received in culture bottles  ? Culture   Final  ?  NO GROWTH 4 DAYS ?Performed at [facility], [address], [place], [place] [address] ?  ? Report Status PENDING  Incomplete  ?Resp Panel by RT-PCR (Flu A&B, Covid) Nasopharyngeal Swab     Status: None  ? Collection Time: 02/09/22  1:22 PM  ? Specimen: Nasopharyngeal Swab; Nasopharyngeal(NP) swabs in vial transport medium  ?Result Value Ref Range Status  ? SARS Coronavirus 2 by RT PCR NEGATIVE NEGATIVE Final  ?  Comment: (NOTE) ?SARS-CoV-2 target  nucleic acids are NOT DETECTED. ? ?The SARS-CoV-2 RNA is generally detectable in upper respiratory ?specimens during the acute phase of infection. The lowest ?concentration of SARS-CoV-2 viral copies this assay

## 2022-02-13 NOTE — Progress Notes (Signed)
?[facility]  ?ROUNDING NOTE  ? ?Subjective:  ? ?Andrea Stewart is a 76 y.o.female with a past medical concerns including COPD, hypertension, depression, diabetes and end stage renal disease. Patient presents to ED with complaints vomiting, diarrhea and abdominal pain. She has been admitted for Hypokalemia [E87.6] ?Lactic acidosis [E87.20] ?Hypomagnesemia [E83.42] ?Pleural effusion [J90] ?Hyperglycemia [R73.9] ?Atrial fibrillation with rapid ventricular response ([place]) [I48.91] ?Elevated brain natriuretic peptide (BNP) level [R79.89] ?Troponin I above reference range [R77.8] ?Atrial fibrillation with RVR ([place]) [I48.91] ?Chronic kidney disease, unspecified CKD stage [N18.9] ? ?Patient is known to our clinic and peritoneal dialysis treatments monitored at [facility] under Dr. Juleen China.   ? ?Update: ?Patient resting well ?States she felt bad yesterday, dropped her blood sugar ?Tolerating meals this morning ?Complains of mild tenderness but states it has improved since admission.  ? ?Objective:  ?Vital signs in last 24 hours:  ?Temp:  [97.6 ?F (36.4 ?C)-98.5 ?F (36.9 ?C)] 98.4 ?F (36.9 ?C) (04/01 7017) ?Pulse Rate:  [66-105] 82 (04/01 0806) ?Resp:  [16-27] 16 (04/01 0806) ?BP: (135-166)/(65-104) 135/65 (04/01 0806) ?SpO2:  [95 %-98 %] 95 % (04/01 0806) ?Weight:  [88.4 kg-88.6 kg] 88.4 kg (04/01 0506) ? ?Weight change: 0.7 kg ?Filed Weights  ? 02/12/22 0700 02/12/22 1708 02/13/22 0506  ?Weight: 85.4 kg 88.6 kg 88.4 kg  ? ? ?Intake/Output: ?I/O last 3 completed shifts: ?In: 79390 [P.O.:840; Other:10000; IV Piggyback:100] ?Out: 30092 [Urine:1800; Other:9882] ?  ?Intake/Output this shift: ? No intake/output data recorded. ? ?Physical Exam: ?General: NAD  ?Head: Normocephalic, atraumatic. Moist oral mucosal membranes  ?Eyes: Anicteric  ?Lungs:  Crackles, normal effort  ?Heart: Regular rate and rhythm  ?Abdomen:  Soft, nontender, obese, PD catheter  ?Extremities: 1+ peripheral edema.  ?Neurologic:  Nonfocal, moving all four extremities  ?Skin: No lesions  ?Access: PD catheter  ? ? ?Basic Metabolic Panel: ?Recent Labs  ?Lab 02/09/22 ?1321 02/09/22 ?1956 02/10/22 ?0310 02/11/22 ?3300 02/12/22 ?7622 02/13/22 ?0353  ?NA 132* 133* 135 134* 135 138  ?K 3.0* 2.3* 2.5* 2.7* 3.0* 3.6  ?CL 92* 95* 98 99 100 102  ?CO2 26 28 29 28 29 30   ?GLUCOSE 537* 313* 176* 285* 329* 222*  ?BUN 19 19 20 18 19 16   ?CREATININE 3.61* 3.70* 3.82* 3.53* 3.64* 3.29*  ?CALCIUM 7.9* 7.9* 7.9* 7.9* 8.0* 8.4*  ?MG 1.1*  --  2.2 1.8 1.8 1.6*  ? ? ? ?Liver Function Tests: ?Recent Labs  ?Lab 02/09/22 ?1321 02/10/22 ?0310  ?AST 30 16  ?ALT 14 13  ?ALKPHOS 95 76  ?BILITOT 1.4* 0.6  ?PROT 6.2* 5.4*  ?ALBUMIN 2.2* 1.8*  ? ? ?Recent Labs  ?Lab 02/09/22 ?1321  ?LIPASE 19  ? ? ?No results for input(s): AMMONIA in the last 168 hours. ? ?CBC: ?Recent Labs  ?Lab 02/09/22 ?1321 02/10/22 ?0310 02/11/22 ?6333 02/12/22 ?5456 02/13/22 ?0353  ?WBC 8.6 9.4 6.2 6.8 6.9  ?HGB 10.8* 9.5* 9.5* 9.5* 9.2*  ?HCT 34.0* 29.3* 29.4* 29.7* 29.3*  ?MCV 94.7 93.6 94.8 96.4 96.1  ?PLT 184 261 258 279 267  ? ? ? ?Cardiac Enzymes: ?No results for input(s): CKTOTAL, CKMB, CKMBINDEX, TROPONINI in the last 168 hours. ? ?BNP: ?Invalid input(s): POCBNP ? ?CBG: ?Recent Labs  ?Lab 02/12/22 ?1616 02/12/22 ?1640 02/12/22 ?1726 02/12/22 ?2104 02/13/22 ?2563  ?GLUCAP 48* 48* 152* 221* 133*  ? ? ? ?Microbiology: ?Results for orders placed or performed during the hospital encounter of 02/09/22  ?Blood Culture (routine x 2)  Status: None (Preliminary result)  ? Collection Time: 02/09/22  1:22 PM  ? Specimen: BLOOD  ?Result Value Ref Range Status  ? Specimen Description BLOOD BLOOD LEFT FOREARM  Final  ? Special Requests   Final  ?  BOTTLES DRAWN AEROBIC AND ANAEROBIC Blood Culture results may not be optimal due to an inadequate volume of blood received in culture bottles  ? Culture   Final  ?  NO GROWTH 4 DAYS ?Performed at [facility], [address] [place], [place]  [address] ?  ? Report Status PENDING  Incomplete  ?Blood Culture (routine x 2)     Status: None (Preliminary result)  ? Collection Time: 02/09/22  1:22 PM  ? Specimen: BLOOD  ?Result Value Ref Range Status  ? Specimen Description BLOOD BLOOD RIGHT HAND  Final  ? Special Requests   Final  ?  BOTTLES DRAWN AEROBIC AND ANAEROBIC Blood Culture results may not be optimal due to an inadequate volume of blood received in culture bottles  ? Culture   Final  ?  NO GROWTH 4 DAYS ?Performed at [facility], [address], [place], [place] [address] ?  ? Report Status PENDING  Incomplete  ?Resp Panel by RT-PCR (Flu A&B, Covid) Nasopharyngeal Swab     Status: None  ? Collection Time: 02/09/22  1:22 PM  ? Specimen: Nasopharyngeal Swab; Nasopharyngeal(NP) swabs in vial transport medium  ?Result Value Ref Range Status  ? SARS Coronavirus 2 by RT PCR NEGATIVE NEGATIVE Final  ?  Comment: (NOTE) ?SARS-CoV-2 target nucleic acids are NOT DETECTED. ? ?The SARS-CoV-2 RNA is generally detectable in upper respiratory ?specimens during the acute phase of infection. The lowest ?concentration of SARS-CoV-2 viral copies this assay can detect is ?138 copies/mL. A negative result does not preclude SARS-Cov-2 ?infection and should not be used as the sole basis for treatment or ?other patient management decisions. A negative result may occur with  ?improper specimen collection/handling, submission of specimen other ?than nasopharyngeal swab, presence of viral mutation(s) within the ?areas targeted by this assay, and inadequate number of viral ?copies(<138 copies/mL). A negative result must be combined with ?clinical observations, patient history, and epidemiological ?information. The expected result is Negative. ? ?Fact Sheet for Patients:  ?EntrepreneurPulse.com.au ? ?Fact Sheet for Healthcare Providers:  ?IncredibleEmployment.be ? ?This test is no t yet approved or cleared by the [place] FDA and   ?has been authorized for detection and/or diagnosis of SARS-CoV-2 by ?FDA under an Emergency Use Authorization (EUA). This EUA will remain  ?in effect (meaning this test can be used) for the duration of the ?COVID-19 declaration under Section 564(b)(1) of the Act, 21 ?U.S.C.section 360bbb-3(b)(1), unless the authorization is terminated  ?or revoked sooner.  ? ? ?  ? Influenza A by PCR NEGATIVE NEGATIVE Final  ? Influenza B by PCR NEGATIVE NEGATIVE Final  ?  Comment: (NOTE) ?The Xpert Xpress SARS-CoV-2/FLU/RSV plus assay is intended as an aid ?in the diagnosis of influenza from Nasopharyngeal swab specimens and ?should not be used as a sole basis for treatment. Nasal washings and ?aspirates are unacceptable for Xpert Xpress SARS-CoV-2/FLU/RSV ?testing. ? ?Fact Sheet for Patients: ?EntrepreneurPulse.com.au ? ?Fact Sheet for Healthcare Providers: ?IncredibleEmployment.be ? ?This test is not yet approved or cleared by the [place] FDA and ?has been authorized for detection and/or diagnosis of SARS-CoV-2 by ?FDA under an Emergency Use Authorization (EUA). This EUA will remain ?in effect (meaning this test can be used) for the duration of the ?COVID-19  declaration under Section 564(b)(1) of the Act, 21 U.S.C. ?section 360bbb-3(b)(1), unless the authorization is terminated or ?revoked. ? ?Performed at [facility], [place], ?[name] [address] ?  ?Body fluid culture w Gram Stain     Status: None  ? Collection Time: 02/09/22  2:09 PM  ? Specimen: Peritoneal Washings; Body Fluid  ?Result Value Ref Range Status  ? Specimen Description   Final  ?  PERITONEAL ?Performed at [facility], [address] [place], [place] [address] ?  ? Special Requests   Final  ?  Normal ?Performed at [facility], [address] [place], [place] [address] ?  ? Gram Stain   Final  ?  FEW WBC PRESENT, PREDOMINANTLY PMN ?NO ORGANISMS SEEN ?  ? Culture   Final  ?   NO GROWTH 3 DAYS ?Performed at [facility], [place] [address] [place], [place] [address] ?  ? Report Status 02/13/2022 FINAL  Final  ?Urine Culture     Status: Abnormal  ? Collection Time: 02/09/22  2:44 PM

## 2022-02-14 ENCOUNTER — Inpatient Hospital Stay
Admission: EM | Admit: 2022-02-14 | Discharge: 2022-02-22 | DRG: 371 | Disposition: A | Discharge status: Home Health | Payer: PPO | Attending: Internal Medicine | Admitting: Internal Medicine

## 2022-02-14 ENCOUNTER — Emergency Department: Payer: PPO

## 2022-02-14 ENCOUNTER — Encounter: Payer: Self-pay | Admitting: Radiology

## 2022-02-14 ENCOUNTER — Other Ambulatory Visit: Payer: Self-pay

## 2022-02-14 DIAGNOSIS — D649 Anemia, unspecified: Secondary | ICD-10-CM | POA: Diagnosis not present

## 2022-02-14 DIAGNOSIS — R0602 Shortness of breath: Principal | ICD-10-CM

## 2022-02-14 DIAGNOSIS — E1165 Type 2 diabetes mellitus with hyperglycemia: Secondary | ICD-10-CM | POA: Diagnosis present

## 2022-02-14 DIAGNOSIS — E162 Hypoglycemia, unspecified: Secondary | ICD-10-CM | POA: Diagnosis not present

## 2022-02-14 DIAGNOSIS — R112 Nausea with vomiting, unspecified: Secondary | ICD-10-CM | POA: Diagnosis present

## 2022-02-14 DIAGNOSIS — R109 Unspecified abdominal pain: Secondary | ICD-10-CM | POA: Diagnosis present

## 2022-02-14 DIAGNOSIS — N186 End stage renal disease: Secondary | ICD-10-CM

## 2022-02-14 DIAGNOSIS — E876 Hypokalemia: Secondary | ICD-10-CM | POA: Diagnosis not present

## 2022-02-14 DIAGNOSIS — R1084 Generalized abdominal pain: Secondary | ICD-10-CM | POA: Diagnosis not present

## 2022-02-14 DIAGNOSIS — E8770 Fluid overload, unspecified: Secondary | ICD-10-CM

## 2022-02-14 DIAGNOSIS — J9601 Acute respiratory failure with hypoxia: Secondary | ICD-10-CM | POA: Diagnosis present

## 2022-02-14 DIAGNOSIS — I10 Essential (primary) hypertension: Secondary | ICD-10-CM | POA: Diagnosis present

## 2022-02-14 DIAGNOSIS — R0902 Hypoxemia: Secondary | ICD-10-CM | POA: Diagnosis not present

## 2022-02-14 DIAGNOSIS — I480 Paroxysmal atrial fibrillation: Secondary | ICD-10-CM | POA: Diagnosis present

## 2022-02-14 DIAGNOSIS — E11649 Type 2 diabetes mellitus with hypoglycemia without coma: Secondary | ICD-10-CM | POA: Diagnosis not present

## 2022-02-14 DIAGNOSIS — Z7982 Long term (current) use of aspirin: Secondary | ICD-10-CM

## 2022-02-14 DIAGNOSIS — D631 Anemia in chronic kidney disease: Secondary | ICD-10-CM | POA: Diagnosis present

## 2022-02-14 DIAGNOSIS — R0689 Other abnormalities of breathing: Secondary | ICD-10-CM | POA: Diagnosis not present

## 2022-02-14 DIAGNOSIS — R739 Hyperglycemia, unspecified: Secondary | ICD-10-CM | POA: Diagnosis not present

## 2022-02-14 DIAGNOSIS — Z66 Do not resuscitate: Secondary | ICD-10-CM | POA: Diagnosis present

## 2022-02-14 DIAGNOSIS — I700 Atherosclerosis of aorta: Secondary | ICD-10-CM | POA: Diagnosis not present

## 2022-02-14 DIAGNOSIS — E1122 Type 2 diabetes mellitus with diabetic chronic kidney disease: Secondary | ICD-10-CM | POA: Diagnosis present

## 2022-02-14 DIAGNOSIS — Z79899 Other long term (current) drug therapy: Secondary | ICD-10-CM

## 2022-02-14 DIAGNOSIS — Z20822 Contact with and (suspected) exposure to covid-19: Secondary | ICD-10-CM | POA: Diagnosis present

## 2022-02-14 DIAGNOSIS — Z6841 Body Mass Index (BMI) 40.0 and over, adult: Secondary | ICD-10-CM | POA: Diagnosis not present

## 2022-02-14 DIAGNOSIS — Z794 Long term (current) use of insulin: Secondary | ICD-10-CM

## 2022-02-14 DIAGNOSIS — Z7951 Long term (current) use of inhaled steroids: Secondary | ICD-10-CM

## 2022-02-14 DIAGNOSIS — Z880 Allergy status to penicillin: Secondary | ICD-10-CM | POA: Diagnosis not present

## 2022-02-14 DIAGNOSIS — Z87891 Personal history of nicotine dependence: Secondary | ICD-10-CM | POA: Diagnosis not present

## 2022-02-14 DIAGNOSIS — D509 Iron deficiency anemia, unspecified: Secondary | ICD-10-CM | POA: Diagnosis not present

## 2022-02-14 DIAGNOSIS — N2581 Secondary hyperparathyroidism of renal origin: Secondary | ICD-10-CM | POA: Diagnosis present

## 2022-02-14 DIAGNOSIS — K659 Peritonitis, unspecified: Secondary | ICD-10-CM | POA: Diagnosis present

## 2022-02-14 DIAGNOSIS — R82998 Other abnormal findings in urine: Secondary | ICD-10-CM | POA: Diagnosis not present

## 2022-02-14 DIAGNOSIS — J449 Chronic obstructive pulmonary disease, unspecified: Secondary | ICD-10-CM | POA: Diagnosis present

## 2022-02-14 DIAGNOSIS — Z888 Allergy status to other drugs, medicaments and biological substances status: Secondary | ICD-10-CM | POA: Diagnosis not present

## 2022-02-14 DIAGNOSIS — Z992 Dependence on renal dialysis: Secondary | ICD-10-CM

## 2022-02-14 DIAGNOSIS — G9341 Metabolic encephalopathy: Secondary | ICD-10-CM | POA: Diagnosis present

## 2022-02-14 DIAGNOSIS — Z7901 Long term (current) use of anticoagulants: Secondary | ICD-10-CM

## 2022-02-14 DIAGNOSIS — Z885 Allergy status to narcotic agent status: Secondary | ICD-10-CM

## 2022-02-14 DIAGNOSIS — I4891 Unspecified atrial fibrillation: Secondary | ICD-10-CM | POA: Diagnosis not present

## 2022-02-14 DIAGNOSIS — R000 Tachycardia, unspecified: Secondary | ICD-10-CM | POA: Diagnosis not present

## 2022-02-14 DIAGNOSIS — N2589 Other disorders resulting from impaired renal tubular function: Secondary | ICD-10-CM | POA: Diagnosis not present

## 2022-02-14 DIAGNOSIS — E440 Moderate protein-calorie malnutrition: Secondary | ICD-10-CM | POA: Diagnosis not present

## 2022-02-14 HISTORY — DX: Essential (primary) hypertension: I10

## 2022-02-14 HISTORY — DX: Type 2 diabetes mellitus without complications: E11.9

## 2022-02-14 LAB — BODY FLUID CULTURE W GRAM STAIN
Culture: NO GROWTH
Special Requests: NORMAL

## 2022-02-14 LAB — RESP PANEL BY RT-PCR (FLU A&B, COVID) ARPGX2
Influenza A by PCR: NEGATIVE
Influenza B by PCR: NEGATIVE
SARS Coronavirus 2 by RT PCR: NEGATIVE

## 2022-02-14 LAB — CBC
HCT: 30 % — ABNORMAL LOW (ref 36.0–46.0)
HCT: 33.5 % — ABNORMAL LOW (ref 36.0–46.0)
Hemoglobin: 9.4 g/dL — ABNORMAL LOW (ref 12.0–15.0)
Hemoglobin: 10.4 g/dL — ABNORMAL LOW (ref 12.0–15.0)
MCH: 30.8 pg (ref 26.0–34.0)
MCH: 30.4 pg (ref 26.0–34.0)
MCHC: 31.3 g/dL (ref 30.0–36.0)
MCHC: 31 g/dL (ref 30.0–36.0)
MCV: 98.4 fL (ref 80.0–100.0)
MCV: 98 fL (ref 80.0–100.0)
Platelets: 265 10*3/uL (ref 150–400)
Platelets: 305 10*3/uL (ref 150–400)
RBC: 3.05 MIL/uL — ABNORMAL LOW (ref 3.87–5.11)
RBC: 3.42 MIL/uL — ABNORMAL LOW (ref 3.87–5.11)
RDW: 14 % (ref 11.5–15.5)
RDW: 14.2 % (ref 11.5–15.5)
WBC: 6.7 10*3/uL (ref 4.0–10.5)
WBC: 7.6 10*3/uL (ref 4.0–10.5)
nRBC: 0 % (ref 0.0–0.2)
nRBC: 0 % (ref 0.0–0.2)

## 2022-02-14 LAB — COMPREHENSIVE METABOLIC PANEL
ALT: 22 U/L (ref 0–44)
AST: 31 U/L (ref 15–41)
Albumin: 2 g/dL — ABNORMAL LOW (ref 3.5–5.0)
Alkaline Phosphatase: 83 U/L (ref 38–126)
Anion gap: 7 (ref 5–15)
BUN: 19 mg/dL (ref 8–23)
CO2: 29 mmol/L (ref 22–32)
Calcium: 8.6 mg/dL — ABNORMAL LOW (ref 8.9–10.3)
Chloride: 100 mmol/L (ref 98–111)
Creatinine, Ser: 3.78 mg/dL — ABNORMAL HIGH (ref 0.44–1.00)
GFR, Estimated: 12 mL/min — ABNORMAL LOW (ref >60)
Glucose, Bld: 501 mg/dL (ref 70–99)
Potassium: 4.4 mmol/L (ref 3.5–5.1)
Sodium: 136 mmol/L (ref 135–145)
Total Bilirubin: 0.6 mg/dL (ref 0.3–1.2)
Total Protein: 6.1 g/dL — ABNORMAL LOW (ref 6.5–8.1)

## 2022-02-14 LAB — CULTURE, BLOOD (ROUTINE X 2)
Culture: NO GROWTH
Culture: NO GROWTH

## 2022-02-14 LAB — BASIC METABOLIC PANEL
Anion gap: 6 (ref 5–15)
BUN: 17 mg/dL (ref 8–23)
CO2: 29 mmol/L (ref 22–32)
Calcium: 8.4 mg/dL — ABNORMAL LOW (ref 8.9–10.3)
Chloride: 102 mmol/L (ref 98–111)
Creatinine, Ser: 3.39 mg/dL — ABNORMAL HIGH (ref 0.44–1.00)
GFR, Estimated: 13 mL/min — ABNORMAL LOW (ref >60)
Glucose, Bld: 296 mg/dL — ABNORMAL HIGH (ref 70–99)
Potassium: 3.4 mmol/L — ABNORMAL LOW (ref 3.5–5.1)
Sodium: 137 mmol/L (ref 135–145)

## 2022-02-14 LAB — GLUCOSE, CAPILLARY: Glucose-Capillary: 296 mg/dL — ABNORMAL HIGH (ref 70–99)

## 2022-02-14 LAB — LACTIC ACID, PLASMA: Lactic Acid, Venous: 2.2 mmol/L (ref 0.5–1.9)

## 2022-02-14 LAB — BRAIN NATRIURETIC PEPTIDE: B Natriuretic Peptide: 315.9 pg/mL — ABNORMAL HIGH (ref 0.0–100.0)

## 2022-02-14 LAB — MAGNESIUM: Magnesium: 2 mg/dL (ref 1.7–2.4)

## 2022-02-14 LAB — VANCOMYCIN, RANDOM: Vancomycin Rm: 15

## 2022-02-14 LAB — TROPONIN I (HIGH SENSITIVITY): Troponin I (High Sensitivity): 26 ng/L — ABNORMAL HIGH (ref <18)

## 2022-02-14 MED ORDER — METOPROLOL SUCCINATE ER 25 MG PO TB24: 25.0000 mg | ORAL_TABLET | Freq: Every day | ORAL | 2 refills | Status: AC

## 2022-02-14 MED ORDER — INSULIN ASPART 100 UNIT/ML IJ SOLN
10.0000 [IU] | Freq: Once | INTRAMUSCULAR | Status: AC
  Administered 2022-02-14: 10 [IU] via INTRAVENOUS
  Filled 2022-02-14: qty 1

## 2022-02-14 MED ORDER — CALCIUM POLYCARBOPHIL 625 MG PO TABS: 625.0000 mg | ORAL_TABLET | Freq: Every day | ORAL | 0 refills | Status: AC

## 2022-02-14 MED ORDER — ASPIRIN 81 MG PO TBEC: 81.0000 mg | DELAYED_RELEASE_TABLET | Freq: Every day | ORAL | Status: AC

## 2022-02-14 MED ORDER — POTASSIUM CHLORIDE CRYS ER 20 MEQ PO TBCR
40.0000 meq | EXTENDED_RELEASE_TABLET | Freq: Once | ORAL | Status: AC
Start: 2022-02-14 — End: 2022-02-14
  Administered 2022-02-14: 40 meq via ORAL
  Filled 2022-02-14: qty 2

## 2022-02-14 MED ORDER — APIXABAN 5 MG PO TABS
5.0000 mg | ORAL_TABLET | Freq: Two times a day (BID) | ORAL | 2 refills | Status: AC
Start: 2022-02-14 — End: 2022-05-15

## 2022-02-14 MED ORDER — SPIRONOLACTONE 25 MG PO TABS: 25.0000 mg | ORAL_TABLET | Freq: Every day | ORAL | 2 refills | Status: AC

## 2022-02-14 MED ORDER — POTASSIUM CHLORIDE 20 MEQ PO PACK: 40.0000 meq | PACK | Freq: Every day | ORAL | 0 refills | Status: AC

## 2022-02-14 MED ORDER — ONDANSETRON HCL 4 MG/2ML IJ SOLN
4.0000 mg | Freq: Once | INTRAMUSCULAR | Status: AC
  Administered 2022-02-14: 4 mg via INTRAVENOUS
  Filled 2022-02-14: qty 2

## 2022-02-14 MED ORDER — FENTANYL CITRATE PF 50 MCG/ML IJ SOSY
50.0000 ug | PREFILLED_SYRINGE | Freq: Once | INTRAMUSCULAR | Status: AC
  Administered 2022-02-14: 50 ug via INTRAVENOUS
  Filled 2022-02-14: qty 1

## 2022-02-14 MED ORDER — FUROSEMIDE 10 MG/ML IJ SOLN
40.0000 mg | Freq: Once | INTRAMUSCULAR | Status: AC
  Administered 2022-02-14: 40 mg via INTRAVENOUS
  Filled 2022-02-14: qty 4

## 2022-02-14 NOTE — TOC Transition Note (Addendum)
Transition of Care (TOC) - CM/SW Discharge Note ? ? ?Patient Details  ?Name: Andrea Stewart ?MRN: [id number] ?Date of Birth: 10-04-1946 ? ?Transition of Care (TOC) CM/SW Contact:  ?Izola Price, RN ?Phone Number: ?02/14/2022, 9:41 AM ? ? ?Clinical Narrative:  Patient admitted 02/09/22 onset of atrial fibrillation with RVR per provider notes. Patient was/is on Peritoneal Dialysis at home. Final cultures on peritoneal washings showed no growth in 3 days per chart. Patient has discharge orders for today. Patient has an aide that assist with transportation to appointments and will transport patient home from hospital per assessment note. Patient was active with [facility] for PT prior to this admission and assessment note confirmed this. [place] [facility] of discharge today. AVS indicated discharge wound care instructions, and follow up appointments to be made with Dr. Emily Filbert (Internal Medicine) in one week. Will pick up medications at [facility] in [place], [name]. Simmie Davies RN CM ? ?Update: Patient will need to go to dialysis center tomorrow to set up antibiotic infusions via peritoneal route per providers. Renal NP will call office to set up appointment for after lunch and they will arrange medication schedule/possible home infusions set up at that time. Patient notified and spouse is able to take her there after lunch tomorrow and will transport home today. Aide is only available on Wednesdays for transportation per patient today. Simmie Davies RN CM  ? ?  ?Barriers to Discharge: Barriers Resolved ? ? ?Patient Goals and CMS Choice ?  ?  ?Choice offered to / list presented to : NA ? ?Discharge Placement ?  ?           ?  ?  ?  ?  ? ?Discharge Plan and Services ?  ?  ?Post Acute Care Choice: Resumption of Svcs/PTA Provider          ?DME Arranged: N/A ?DME Agency: NA ?  ?  ?  ?HH Arranged:  (Active with [facility] for PT prior to admission.) ?  ?Date HH Agency Contacted: 02/10/22 ?   ?Representative spoke with at [place]: Confimred by CSW CM 02/10/22 per progression note. ? ?Social Determinants of Health (SDOH) Interventions ?  ? ? ?Readmission Risk Interventions ? ?  02/10/2022  ? 11:55 AM  ?Readmission Risk Prevention Plan  ?Transportation Screening Complete  ?PCP or Specialist Appt within 3-5 Days Complete  ?[place] or Home Care Consult Complete  ?Social Work Consult for [place] Planning/Counseling Complete  ?Palliative Care Screening Not Applicable  ?Medication Review Press photographer) Complete  ? ? ? ? ? ?

## 2022-02-14 NOTE — Discharge Summary (Signed)
? ?Physician Discharge Summary ? ? ?Andrea Stewart  female DOB: 12-Feb-1946  ?NLG:[id number] ? ?PCP: Rusty Aus, MD ? ?Admit date: 02/09/2022 ?Discharge date: 02/14/2022 ? ?Admitted From: home ?Disposition:  home ?CODE STATUS: Full code ? ?Discharge Instructions   ? ? Discharge instructions   Complete by: As directed ?  ? The kidney doctor wants you to go to dialysis center tomorrow and get set up for further IV antibiotics. ? ?I have increased your Toprol to 50 mg daily and added Eliquis (blood thinner) per Dr. Donivan Scull instruction.  Please follow up with Dr. Rockey Situ. ? ?Your potassium level has been low in the hospital requiring daily supplementation.  Please take 7 more days of potassium supplement and have your outpatient provider check level before more refills. ? ?Your blood sugars have varied widely.  Even on much lower dose of long-acting insulin, your blood sugars dropped usually in the late afternoon.  Since you have refused long-acting insulin for the past several days, I am not able to give recommendation on your insulin regimen.  After discharge, please go back to managing your insulin based on blood sugars reading like you were doing prior to presentation. ? ? ?Dr. Enzo Bi ?- ?-  ? Discharge wound care:   Complete by: As directed ?  ? Clean skin near exit site with chloraprep swab sticks.  Starting at catheter, use circular pattern around exit site, moving towards outer edges of area covered by dressing.  Apply gentamicin cream to site once daily.  Cover with dry dressing. ?- ?-  ? ?  ? ?Hospital Course:  ?For full details, please see H&P, progress notes, consult notes and ancillary notes.  ?Briefly,  ?Andrea Stewart is a 76 y.o. female with medical history significant for morbid obesity, esrd on peritoneal dialysis, PAD, COPD, who presented with abdominal pain, vomiting, diarrhea. ?  ?She says she was treated for c diff colitis last month and for peritoneal fluid infection earlier this month,  both by her nephrologist. She says she has developed diffuse abdominal pain for the past three days along with dyspnea, vomiting, and non-bloody diarrhea. ?  ?Found to be in a fib with rvr. Started on diltiazim gtt after bolus failed to improve HR.  HR controlled and dilt gtt weaned off by midnight. ?  ?# Abdominal pain with concern for peritonitis ?Treated for peritonitis due to Enterococcus faecalis 3 weeks ago. Back with new abd pain, vomiting/diarrhea ?- Repeat peritoneal fluid obtained during this admission, cell count is > 1000, however, fluid cx neg growth. ?- ID consulted.  Clinically, pt did not appear septic and symptom improved without abx tx.  However due to concern for culture-negative peritonitis, pt was started on empiric vanc/ceftazidime on 3/30, and will continue for 2 more weeks after discharge, per nephro rec.  Pt was instructed to go to dialysis center the day after discharge to arrange outpatient IV abx treatment. ?  ?# Diarrhea ?Recently treated for c diff.  No acute GI findings on ct c/a/p.  Started on fidaxomicin on admission. ?--C diff and GI path both neg, fidaxomicin d/c'ed. ?--Pt was concerned about Imodium causing constipation, so cont fibercon for stool bulking  ?  ?# Afib w RVR ?Patient said she has a hx of this but formal dx not noted in medical records.  Unclear whether pt was taking metop PTA.  HR controlled on dilt gtt. ?--Discussed with her outpatient cardiologist Dr. Rockey Situ, and pt was managed without formal cardio consult.  Resumed home Toprol at increased 25 mg daily, and started on eliquis. ?  ?# Dyspnea 2/2 ?Pulm edema 2/2 ?dCHF exacerbation ?--Echo normal LVEF, grade I DD. ?--received on IV lasix 80 mg daily while inpatient.   ?--transitioned back to home oral lasix 80 mg and resumed home aldactone prior to discharge. ?  ?# DM2, poorly controlled with Hyperglycemia ?# Hypoglycemia ?On presentation, glucose very elevated to 500s, no signs dka. ?--BG varied widely.  Even on  much lower dose of long-acting insulin, BG dropped usually in the late afternoon.  Attempts were made to adjust pt's long-acting insulin, however, due to fear of hypoglycemia, pt refused long-acting insulin for several days prior to discharge, so I was not able to give recommendation on insulin regimen.  After discharge, Pt is advised to manage her BG and insulin as she had been PTA. ? ?# Lactic acidosis, resolved ?2/2 all the above ?  ?# Hypokalemia, persistent ?# Hypomagnesemia ?--monitored and repleted with oral potassium, and IV mag while inpatient. ?--Due to need for daily supplementation for potassium, pt was discharged on 40 mEq daily for 7 days.  Pt will need potassium level checked with outpatient provider to see if further supplementation is needed. ?  ?# Chronic pain ?--per home med list, pt is no longer taking Norco PTA. ?  ?# GAD ?- home xanax PRN, amitryptyline ?  ?# COPD ?- home dulera ? ? ?Discharge Diagnoses:  ?Principal Problem: ?  Atrial fibrillation with rapid ventricular response ([place]) ?Active Problems: ?  T2DM (type 2 diabetes mellitus) ([place]) ?  Essential hypertension ?  Single kidney ?  COPD (chronic obstructive pulmonary disease) with chronic bronchitis ([place]) ?  Morbid obesity ([place]) ?  PAD (peripheral artery disease) ([place]) ?  ESRD on peritoneal dialysis [facility]) ? ? ?30 Day Unplanned Readmission Risk Score   ? ?Flowsheet Row ED to Hosp-Admission (Current) from 02/09/2022 in [facility]  ?30 Day Unplanned Readmission Risk Score (%) 29.87 Filed at 02/14/2022 0801  ? ?  ? ? This score is the patient's risk of an unplanned readmission within 30 days of being discharged (0 -100%). The score is based on dignosis, age, lab data, medications, orders, and past utilization.   ?Low:  0-14.9   Medium: 15-21.9   High: 22-29.9   Extreme: 30 and above ? ?  ? ?  ? ? ?Discharge Instructions: ? ?Allergies as of 02/14/2022   ? ?   Reactions  ? Gabapentin Other (See Comments)  ?  intolerance ?intolerance ?"makes me crazy" ?"makes me crazy"  ? Morphine Anaphylaxis  ? Penicillins Swelling, Other (See Comments)  ? Remote reaction as a child to Pen V K. Patient has tolerated augmentin and cefazolin  ?REACTION: rash, swelling.   ? Prednisone Other (See Comments)  ? "makes me think I am having a heart attack"  ? Hydralazine Itching  ? Nsaids Diarrhea  ? ?  ? ?  ?Medication List  ?  ? ?STOP taking these medications   ? ?HYDROcodone-acetaminophen 5-325 MG tablet ?Commonly known as: NORCO/VICODIN ?  ?magnesium oxide 400 (240 Mg) MG tablet ?Commonly known as: MAG-OX ?  ?melatonin 3 MG Tabs tablet ?  ?Vitamin D (Ergocalciferol) 1.25 MG (50000 UNIT) Caps capsule ?Commonly known as: DRISDOL ?  ? ?  ? ?TAKE these medications   ? ?acetaminophen 325 MG tablet ?Commonly known as: TYLENOL ?Take 2 tablets (650 mg total) by mouth every 6 (six) hours as needed for mild pain or headache (headache). ?  ?  AeroChamber MV inhaler ?Use as instructed ?  ?ALPRAZolam 1 MG tablet ?Commonly known as: Duanne Moron ?Take 0.5 tablets (0.5 mg total) by mouth at bedtime. ?  ?apixaban 5 MG Tabs tablet ?Commonly known as: ELIQUIS ?Take 1 tablet (5 mg total) by mouth 2 (two) times daily. ?  ?aspirin 81 MG EC tablet ?Take 1 tablet (81 mg total) by mouth daily. Home med. ?What changed:  ?when to take this ?additional instructions ?  ?atorvastatin 20 MG tablet ?Commonly known as: LIPITOR ?Take 20 mg by mouth daily. ?  ?budesonide-formoterol 160-4.5 MCG/ACT inhaler ?Commonly known as: SYMBICORT ?Inhale 2 puffs into the lungs daily. ?  ?Dexcom G6 Receiver Kerrin Mo ?See admin instructions. ?  ?furosemide 80 MG tablet ?Commonly known as: LASIX ?Take 1 tablet (80 mg total) by mouth daily. ?  ?gentamicin cream 0.1 % ?Commonly known as: GARAMYCIN ?Apply 1 application topically daily. ?  ?insulin lispro 100 UNIT/ML injection ?Commonly known as: HUMALOG ?Inject 3-5 Units into the skin 3 (three) times daily with meals. ?  ?insulin NPH Human 100 UNIT/ML  injection ?Commonly known as: NOVOLIN N ?Inject 0.16 mLs (16 Units total) into the skin daily before breakfast AND 0.28 mLs (28 Units total) at bedtime. ?What changed: See the new instructions. ?  ?metoprolol succinate 25 MG

## 2022-02-14 NOTE — ED Provider Notes (Signed)
? ?[facility] ?Provider Note ? ? ? Event Date/Time  ? First MD Initiated Contact with Patient 02/14/22 2137   ?  (approximate) ? ?History  ? ?Chief Complaint: Shortness of Breath ? ?HPI ? ?Andrea Stewart is a 76 y.o. female with a past medical history of  morbid obesity, esrd on peritoneal dialysis, PAD, COPD, who presents to the emergency department tonight for shortness of breath.  Patient states she was discharged earlier today.  Per record review patient was discharged earlier today, she was admitted for A-fib with RVR abdominal pain currently being ruled out for peritonitis on IV vancomycin and ceftazidime.  Patient continues to have abdominal pain.  States she attempted to do dialysis tonight but could not due to discomfort so she came to the emergency department.  Patient states she is feeling short of breath.  Patient states she only makes a very small amount of urine each day.  Patient does have swelling to bilateral lower extremities which she states is worsening.  Patient currently satting 92 to 94% on room air, no baseline O2 requirement. ? ?Physical Exam  ? ?Triage Vital Signs: ?ED Triage Vitals  ?Enc Vitals Group  ?   BP 02/14/22 2140 (!) 167/110  ?   Pulse Rate 02/14/22 2129 98  ?   Resp 02/14/22 2129 (!) 28  ?   Temp 02/14/22 2129 98.6 ?F (37 ?C)  ?   Temp Source 02/14/22 2129 Oral  ?   SpO2 02/14/22 2129 94 %  ?   Weight 02/14/22 2126 195 lb 15.8 oz (88.9 kg)  ?   Height 02/14/22 2126 4\' 11"  (1.499 m)  ?   Head Circumference --   ?   Peak Flow --   ?   Pain Score 02/14/22 2126 5  ?   Pain Loc --   ?   Pain Edu? --   ?   Excl. in [place]? --   ? ? ?Most recent vital signs: ?Vitals:  ? 02/14/22 2129 02/14/22 2140  ?BP:  (!) 167/110  ?Pulse: 98   ?Resp: (!) 28   ?Temp: 98.6 ?F (37 ?C)   ?SpO2: 94%   ? ? ?General: Awake, no distress.  ?CV:  Good peripheral perfusion.  Regular rate and rhythm  ?Resp:  Normal effort.  Equal breath sounds bilaterally.  ?Abd:  Obese, mild diffuse  tenderness to palpation.  Peritoneal dialysis catheter in place.  No rebound or guarding. ?Other:  2+ lower extremity edema bilaterally. ? ? ?ED Results / Procedures / Treatments  ? ?EKG ? ?EKG viewed and interpreted by myself shows what appears to be atrial fibrillation at 99 bpm with a narrow QRS, normal axis, normal intervals, nonspecific but no concerning ST changes. ? ?RADIOLOGY ? ?I personally reviewed the chest x-ray images, poor inspiration but appears to have haziness mostly in the bilateral lower lobes. ?Radiology has read the chest x-ray is interstitial prominence likely interstitial edema. ? ? ?MEDICATIONS ORDERED IN ED: ?Medications  ?ondansetron (ZOFRAN) injection 4 mg (has no administration in time range)  ? ? ? ?IMPRESSION / MDM / ASSESSMENT AND PLAN / ED COURSE  ?I reviewed the triage vital signs and the nursing notes. ? ?Patient presents to the emergency department for worsening shortness of breath and abdominal pain with attempted peritoneal dialysis tonight.  Clinically patient appears fluid overloaded.  Patient has 2+ pitting edema to lower extremities, satting in the low 90s on room air with tachypnea around 30 breaths/min.  We will  obtain labs, chest x-ray.  I reviewed the patient's discharge summary as well as progress notes during her admission this past week.  Patient is currently being ruled out for peritonitis, does not need repeat cultures.  Patient receiving IV antibiotics since 02/11/2022.  We will check labs including cardiac enzymes, BNP we will obtain a chest x-ray we will continue to closely monitor while awaiting results.  Given the patient's borderline hypoxia and tachypnea and abdominal pain unable to tolerate peritoneal dialysis at home anticipate admission to the hospital once the patient's ER work-up is been completed. ? ?Patient's labs have resulted showing hyperglycemia 501, chronic renal insufficiency.  We will dose IV insulin.  Patient CBC largely unchanged from baseline.   Troponin mildly elevated however likely explained by the patient's renal insufficiency which chronic.  Patient's BNP 315.  Chest x-ray appears to show interstitial edema.  Patient does create urine each day we will dose small amount of IV Lasix.  Given the patient's fluid overload satting in the low 90s on room air with continued tachypnea abdominal pain unable to tolerate peritoneal dialysis at home we will admit to the hospital service for ongoing treatment and management. ? ?FINAL CLINICAL IMPRESSION(S) / ED DIAGNOSES  ? ?Dyspnea ?Fluid overload ?Abdominal pain ? ? ?Note:  This document was prepared using Dragon voice recognition software and may include unintentional dictation errors. ?  Harvest Dark, MD ?02/14/22 2251 ? ?

## 2022-02-14 NOTE — ED Notes (Signed)
Pt placed on bedpan.  Pt noted to have BM while on bedpan.   ?

## 2022-02-14 NOTE — ED Triage Notes (Signed)
Pt presents to ER via [facility] from home c/o sob that started today and has been getting worse throughout the day.  Pt is a PD dialysis tx and does treatments every day been having more trouble with completing treatments recently.  Pt has increased abd swelling per pt.  Pt noted to be around 85% on RA with improvement to low-mid 90s on 10 L via NRB per ems.  Pt with decreased lung sounds BIL.  Pt is A&O x4 at this time.  Pt appears sob in room.    ?

## 2022-02-14 NOTE — ED Notes (Signed)
Pt's O2 sats noted to be dropping into mid 60's on RA post med administration.  10 L NRB applied to pt at this time with marked improvement of O2 sats.  Pt currently resting in stretcher with eyes closed.  RR even and unlabored at this time.  O2 sats currently 97% on 10L NRB.   ?

## 2022-02-14 NOTE — H&P (Signed)
?History and Physical  ? ? ?Patient: Andrea Stewart MGQ:[id number] DOB: August 27, 1946 ?DOA: 02/14/2022 ?DOS: the patient was seen and examined on 02/15/2022 ?PCP: Pcp, No  ?Patient coming from: Home ? ?Chief Complaint:  ?Chief Complaint  ?Patient presents with  ? Shortness of Breath  ? ?HPI: Andrea Stewart is a 76 y.o. female with medical history significant of end-stage renal disease on peritoneal dialysis, COPD, morbid obesity comes to the emergency room with complaints of shortness of breath as chief complaint however to me husband states that patient came in for abdominal pain not shortness of breath.  Patient was admitted and discharged today itself with IV antibiotic regimen for suspected peritonitis.  Patient was attempting to do dialysis tonight but could not and was having abdominal pain and shortness of breath and came to the emergency room.  Per husband patient makes urine but very little urine.  Patient on my exam is somnolent and on 5 L nasal cannula.  Patient was on no nonrebreather initially.  I suspect patient will require BiPAP over the course of the night.  I will therefore admit patient in the stepdown unit.  In the emergency room EKG shows A-fib at 99 with normal axis and no ST-T wave changes.  Patient has another chart and the chart is yet to be merged.  Husband states that the abdominal pain has been going on for several weeks.  Blood work shows an elevated BNP of 315 and x-ray is consistent with interstitial edema.  Patient was given Lasix in the emergency room and admission requested for volume overload and shortness of breath. ? ? ?Review of Systems: Review of Systems  ?Unable to perform ROS: Acuity of condition  ? ?Past Medical History:  ?Diagnosis Date  ? Diabetes mellitus without complication ([place])   ? Hypertension   ? ?History reviewed. No pertinent surgical history. ?Social History:  reports that she has quit smoking. Her smoking use included cigarettes. She has never used smokeless tobacco.  She reports that she does not currently use alcohol. No history on file for drug use. ? ?Not on File ? ?History reviewed. No pertinent family history. ? ?Prior to Admission medications   ?Not on File  ? ? ?Physical Exam: ?Vitals:  ? 02/14/22 2231 02/14/22 2306 02/14/22 2330 02/15/22 0022  ?BP: 140/86 (!) 164/93 (!) 147/85 (!) 141/65  ?Pulse: 83 83 81 88  ?Resp: (!) 23 (!) 21 (!) 29 (!) 25  ?Temp:      ?TempSrc:      ?SpO2: 98% 94% 99% 95%  ?Weight:      ?Height:      ? ?Physical Exam ?Vitals and nursing note reviewed.  ?Constitutional:   ?   General: She is not in acute distress. ?   Appearance: Normal appearance. She is obese. She is not ill-appearing, toxic-appearing or diaphoretic.  ?HENT:  ?   Head: Normocephalic and atraumatic.  ?   Right Ear: Hearing and external ear normal.  ?   Left Ear: Hearing and external ear normal.  ?   Nose: Nose normal. No nasal deformity.  ?   Mouth/Throat:  ?   Lips: Pink.  ?   Mouth: Mucous membranes are moist.  ?   Tongue: No lesions.  ?   Pharynx: Oropharynx is clear.  ?Eyes:  ?   Extraocular Movements: Extraocular movements intact.  ?   Pupils: Pupils are equal, round, and reactive to light.  ?Neck:  ?   Vascular: No carotid bruit.  ?Cardiovascular:  ?  Rate and Rhythm: Normal rate and regular rhythm.  ?   Pulses: Normal pulses.  ?   Heart sounds: Normal heart sounds.  ?Pulmonary:  ?   Effort: Pulmonary effort is normal.  ?   Breath sounds: Rales present.  ?Abdominal:  ?   General: Abdomen is protuberant. Bowel sounds are normal. There is no distension.  ?   Palpations: Abdomen is soft. There is no mass.  ?   Tenderness: There is generalized abdominal tenderness. There is guarding and rebound.  ?   Hernia: No hernia is present.  ? ? ?Musculoskeletal:  ?   Right lower leg: No edema.  ?   Left lower leg: No edema.  ?Skin: ?   General: Skin is warm.  ?Neurological:  ?   Mental Status: She is lethargic.  ?   Cranial Nerves: Cranial nerves 2-12 are intact.  ? ? ?Data  Reviewed: ?Results for orders placed or performed during the hospital encounter of 02/14/22 (from the past 24 hour(s))  ?CBC     Status: Abnormal  ? Collection Time: 02/14/22  9:32 PM  ?Result Value Ref Range  ? WBC 7.6 4.0 - 10.5 K/uL  ? RBC 3.42 (L) 3.87 - 5.11 MIL/uL  ? Hemoglobin 10.4 (L) 12.0 - 15.0 g/dL  ? HCT 33.5 (L) 36.0 - 46.0 %  ? MCV 98.0 80.0 - 100.0 fL  ? MCH 30.4 26.0 - 34.0 pg  ? MCHC 31.0 30.0 - 36.0 g/dL  ? RDW 14.2 11.5 - 15.5 %  ? Platelets 305 150 - 400 K/uL  ? nRBC 0.0 0.0 - 0.2 %  ?Comprehensive metabolic panel     Status: Abnormal  ? Collection Time: 02/14/22  9:32 PM  ?Result Value Ref Range  ? Sodium 136 135 - 145 mmol/L  ? Potassium 4.4 3.5 - 5.1 mmol/L  ? Chloride 100 98 - 111 mmol/L  ? CO2 29 22 - 32 mmol/L  ? Glucose, Bld 501 (HH) 70 - 99 mg/dL  ? BUN 19 8 - 23 mg/dL  ? Creatinine, Ser 3.78 (H) 0.44 - 1.00 mg/dL  ? Calcium 8.6 (L) 8.9 - 10.3 mg/dL  ? Total Protein 6.1 (L) 6.5 - 8.1 g/dL  ? Albumin 2.0 (L) 3.5 - 5.0 g/dL  ? AST 31 15 - 41 U/L  ? ALT 22 0 - 44 U/L  ? Alkaline Phosphatase 83 38 - 126 U/L  ? Total Bilirubin 0.6 0.3 - 1.2 mg/dL  ? GFR, Estimated 12 (L) >60 mL/min  ? Anion gap 7 5 - 15  ?Troponin I (High Sensitivity)     Status: Abnormal  ? Collection Time: 02/14/22  9:32 PM  ?Result Value Ref Range  ? Troponin I (High Sensitivity) 26 (H) <18 ng/L  ?Brain natriuretic peptide     Status: Abnormal  ? Collection Time: 02/14/22  9:32 PM  ?Result Value Ref Range  ? B Natriuretic Peptide 315.9 (H) 0.0 - 100.0 pg/mL  ?Lactic acid, plasma     Status: Abnormal  ? Collection Time: 02/14/22  9:32 PM  ?Result Value Ref Range  ? Lactic Acid, Venous 2.2 (HH) 0.5 - 1.9 mmol/L  ?Procalcitonin     Status: None  ? Collection Time: 02/14/22  9:32 PM  ?Result Value Ref Range  ? Procalcitonin 0.28 ng/mL  ?Resp Panel by RT-PCR (Flu A&B, Covid) Nasopharyngeal Swab     Status: None  ? Collection Time: 02/14/22 10:07 PM  ? Specimen: Nasopharyngeal Swab; Nasopharyngeal(NP) swabs in vial transport  medium  ?Result Value Ref Range  ? SARS Coronavirus 2 by RT PCR NEGATIVE NEGATIVE  ? Influenza A by PCR NEGATIVE NEGATIVE  ? Influenza B by PCR NEGATIVE NEGATIVE  ? ? ? ?Assessment and Plan: ?* Abdominal pain ?D/d include Peritonitis although the abd pain is acute and wake up patient and she falls back asleep.  ?Pt has rebound and tenderness. ?D/d also include Portal vein thrombosis of bowel ischemia as she has a.fib and is not on anticoagulation.  ?Have sent message to Nephrology about angio contrast study for eval of ischemia. ?We have done ct noncotrast in meantime.  ? ?SOB (shortness of breath) ?D/d include Volume overload from renal failure due to  suspect failure to dialysis from Pd catheter dysfunction. ?Nephrology consult.  ?Supportive care with supplemental oxygen.  ?  ? ? ?End stage renal disease ([place]) ?Nephrology consult.  ? ? ?Essential hypertension ?Blood pressure (!) 141/65, pulse 88, temperature 98.6 ?F (37 ?C), temperature source Oral, resp. rate (!) 25, height 4\' 11"  (1.499 m), weight 88.9 kg, SpO2 95 %. ?once chart is merged resume appropriate meds.  ? ? ?Hyperglycemia due to type 2 diabetes mellitus ([place]) ?Glycemic protocol q 6 hourly as pt is npo as she may need nippv overnight.  ? ? ?Anemia ?Attribute to ACD.  ?We will type and screen transfuse as necessary ?  ? ? ?Advance Care Planning:  ?  Code Status: Full Code  ? ?Consults:  ?Nephrology- Dr Holley Raring.  ? ? ?Family Communication:  ?Sellin,Roy (Spouse)  ?[phone] ([place]) ? ?Severity of Illness: ?The appropriate patient status for this patient is OBSERVATION. Observation status is judged to be reasonable and necessary in order to provide the required intensity of service to ensure the patient's safety. The patient's presenting symptoms, physical exam findings, and initial radiographic and laboratory data in the context of their medical condition is felt to place them at decreased risk for further clinical deterioration. Furthermore, it is  anticipated that the patient will be medically stable for discharge from the hospital within 2 midnights of admission.  ? ?Author: ?Para Skeans, MD ?02/15/2022 1:01 AM ? ?For on call review [email or web address].  ?

## 2022-02-14 NOTE — Progress Notes (Signed)
?[facility]  ?ROUNDING NOTE  ? ?Subjective:  ? ?Andrea Stewart is a 76 y.o.female with a past medical concerns including COPD, hypertension, depression, diabetes and end stage renal disease. Patient presents to ED with complaints vomiting, diarrhea and abdominal pain. She has been admitted for Hypokalemia [E87.6] ?Lactic acidosis [E87.20] ?Hypomagnesemia [E83.42] ?Pleural effusion [J90] ?Hyperglycemia [R73.9] ?Atrial fibrillation with rapid ventricular response ([place]) [I48.91] ?Elevated brain natriuretic peptide (BNP) level [R79.89] ?Troponin I above reference range [R77.8] ?Atrial fibrillation with RVR ([place]) [I48.91] ?Chronic kidney disease, unspecified CKD stage [N18.9] ? ?Patient is known to our clinic and peritoneal dialysis treatments monitored at [facility] under Dr. Juleen China.   ? ?Update: ?Patient seen sitting at side of bed, eating breakfast ?Husband at bedside ?Complains of mild abdominal pain ?Overall feels much improved since admission, requesting discharge ? ? ?Objective:  ?Vital signs in last 24 hours:  ?Temp:  [97.6 ?F (36.4 ?C)-98.7 ?F (37.1 ?C)] 97.8 ?F (36.6 ?C) (04/02 8127) ?Pulse Rate:  [61-93] 93 (04/02 0813) ?Resp:  [16-25] 21 (04/02 0813) ?BP: (125-168)/(55-88) 130/58 (04/02 0903) ?SpO2:  [93 %-98 %] 94 % (04/02 0903) ?Weight:  [86.6 kg-87.1 kg] 87.1 kg (04/02 0328) ? ?Weight change: -2 kg ?Filed Weights  ? 02/13/22 0506 02/13/22 1601 02/14/22 0328  ?Weight: 88.4 kg 86.6 kg 87.1 kg  ? ? ?Intake/Output: ?I/O last 3 completed shifts: ?In: 1514.5 [P.O.:360; I.V.:6; Other:1000; IV Piggyback:148.5] ?Out: 51700 [Urine:600; FVCBS:4967] ?  ?Intake/Output this shift: ? Total I/O ?In: 10000 [Other:10000] ?Out: 5916 [Other:9570] ? ?Physical Exam: ?General: NAD  ?Head: Normocephalic, atraumatic. Moist oral mucosal membranes  ?Eyes: Anicteric  ?Lungs:  Clear bilaterally, normal effort  ?Heart: Regular rate and rhythm  ?Abdomen:  Soft, nontender, obese, PD catheter  ?Extremities: 1+  peripheral edema.  ?Neurologic: Nonfocal, moving all four extremities  ?Skin: No lesions  ?Access: PD catheter  ? ? ?Basic Metabolic Panel: ?Recent Labs  ?Lab 02/10/22 ?0310 02/11/22 ?3846 02/12/22 ?6599 02/13/22 ?3570 02/14/22 ?0354  ?NA 135 134* 135 138 137  ?K 2.5* 2.7* 3.0* 3.6 3.4*  ?CL 98 99 100 102 102  ?CO2 29 28 29 30 29   ?GLUCOSE 176* 285* 329* 222* 296*  ?BUN 20 18 19 16 17   ?CREATININE 3.82* 3.53* 3.64* 3.29* 3.39*  ?CALCIUM 7.9* 7.9* 8.0* 8.4* 8.4*  ?MG 2.2 1.8 1.8 1.6* 2.0  ? ? ? ?Liver Function Tests: ?Recent Labs  ?Lab 02/09/22 ?1321 02/10/22 ?0310  ?AST 30 16  ?ALT 14 13  ?ALKPHOS 95 76  ?BILITOT 1.4* 0.6  ?PROT 6.2* 5.4*  ?ALBUMIN 2.2* 1.8*  ? ? ?Recent Labs  ?Lab 02/09/22 ?1321  ?LIPASE 19  ? ? ?No results for input(s): AMMONIA in the last 168 hours. ? ?CBC: ?Recent Labs  ?Lab 02/10/22 ?0310 02/11/22 ?1779 02/12/22 ?3903 02/13/22 ?0092 02/14/22 ?0354  ?WBC 9.4 6.2 6.8 6.9 6.7  ?HGB 9.5* 9.5* 9.5* 9.2* 9.4*  ?HCT 29.3* 29.4* 29.7* 29.3* 30.0*  ?MCV 93.6 94.8 96.4 96.1 98.4  ?PLT 261 258 279 267 265  ? ? ? ?Cardiac Enzymes: ?No results for input(s): CKTOTAL, CKMB, CKMBINDEX, TROPONINI in the last 168 hours. ? ?BNP: ?Invalid input(s): POCBNP ? ?CBG: ?Recent Labs  ?Lab 02/13/22 ?0807 02/13/22 ?1132 02/13/22 ?1646 02/13/22 ?1950 02/14/22 ?0815  ?[place] ? ? ?Microbiology: ?Results for orders placed or performed during the hospital encounter of 02/09/22  ?Blood Culture (routine x 2)     Status: None  ? Collection Time: 02/09/22  1:22  PM  ? Specimen: BLOOD  ?Result Value Ref Range Status  ? Specimen Description BLOOD BLOOD LEFT FOREARM  Final  ? Special Requests   Final  ?  BOTTLES DRAWN AEROBIC AND ANAEROBIC Blood Culture results may not be optimal due to an inadequate volume of blood received in culture bottles  ? Culture   Final  ?  NO GROWTH 5 DAYS ?Performed at [facility], [address], [place], [place] [address] ?  ? Report Status 02/14/2022 FINAL  Final   ?Blood Culture (routine x 2)     Status: None  ? Collection Time: 02/09/22  1:22 PM  ? Specimen: BLOOD  ?Result Value Ref Range Status  ? Specimen Description BLOOD BLOOD RIGHT HAND  Final  ? Special Requests   Final  ?  BOTTLES DRAWN AEROBIC AND ANAEROBIC Blood Culture results may not be optimal due to an inadequate volume of blood received in culture bottles  ? Culture   Final  ?  NO GROWTH 5 DAYS ?Performed at [facility], [address] [place], [place] [address] ?  ? Report Status 02/14/2022 FINAL  Final  ?Resp Panel by RT-PCR (Flu A&B, Covid) Nasopharyngeal Swab     Status: None  ? Collection Time: 02/09/22  1:22 PM  ? Specimen: Nasopharyngeal Swab; Nasopharyngeal(NP) swabs in vial transport medium  ?Result Value Ref Range Status  ? SARS Coronavirus 2 by RT PCR NEGATIVE NEGATIVE Final  ?  Comment: (NOTE) ?SARS-CoV-2 target nucleic acids are NOT DETECTED. ? ?The SARS-CoV-2 RNA is generally detectable in upper respiratory ?specimens during the acute phase of infection. The lowest ?concentration of SARS-CoV-2 viral copies this assay can detect is ?138 copies/mL. A negative result does not preclude SARS-Cov-2 ?infection and should not be used as the sole basis for treatment or ?other patient management decisions. A negative result may occur with  ?improper specimen collection/handling, submission of specimen other ?than nasopharyngeal swab, presence of viral mutation(s) within the ?areas targeted by this assay, and inadequate number of viral ?copies(<138 copies/mL). A negative result must be combined with ?clinical observations, patient history, and epidemiological ?information. The expected result is Negative. ? ?Fact Sheet for Patients:  ?EntrepreneurPulse.com.au ? ?Fact Sheet for Healthcare Providers:  ?IncredibleEmployment.be ? ?This test is no t yet approved or cleared by the [place] FDA and  ?has been authorized for detection and/or diagnosis of  SARS-CoV-2 by ?FDA under an Emergency Use Authorization (EUA). This EUA will remain  ?in effect (meaning this test can be used) for the duration of the ?COVID-19 declaration under Section 564(b)(1) of the Act, 21 ?U.S.C.section 360bbb-3(b)(1), unless the authorization is terminated  ?or revoked sooner.  ? ? ?  ? Influenza A by PCR NEGATIVE NEGATIVE Final  ? Influenza B by PCR NEGATIVE NEGATIVE Final  ?  Comment: (NOTE) ?The Xpert Xpress SARS-CoV-2/FLU/RSV plus assay is intended as an aid ?in the diagnosis of influenza from Nasopharyngeal swab specimens and ?should not be used as a sole basis for treatment. Nasal washings and ?aspirates are unacceptable for Xpert Xpress SARS-CoV-2/FLU/RSV ?testing. ? ?Fact Sheet for Patients: ?EntrepreneurPulse.com.au ? ?Fact Sheet for Healthcare Providers: ?IncredibleEmployment.be ? ?This test is not yet approved or cleared by the [place] FDA and ?has been authorized for detection and/or diagnosis of SARS-CoV-2 by ?FDA under an Emergency Use Authorization (EUA). This EUA will remain ?in effect (meaning this test can be used) for the duration of the ?COVID-19 declaration under Section 564(b)(1) of the Act, 21 U.S.C. ?section 360bbb-3(b)(1),  unless the authorization is terminated or ?revoked. ? ?Performed at [facility], [place], ?[name] [address] ?  ?Body fluid culture w Gram Stain     Status: None  ? Collection Time: 02/09/22  2:09 PM  ? Specimen: Peritoneal Washings; Body Fluid  ?Result Value Ref Range Status  ? Specimen Description   Final  ?  PERITONEAL ?Performed at [facility], [address] [place], [place] [address] ?  ? Special Requests   Final  ?  Normal ?Performed at [facility], [address] [place], [place] [address] ?  ? Gram Stain   Final  ?  FEW WBC PRESENT, PREDOMINANTLY PMN ?NO ORGANISMS SEEN ?  ? Culture   Final  ?  NO GROWTH 3 DAYS ?Performed at [facility],  [place] [address], [place], [place] [address] ?  ? Report Status 02/14/2022 FINAL  Final  ?Urine Culture     Status: Abnormal  ? Collection Time: 02/09/22  2:44 PM  ? Specimen: In/Out Cath Urine  ?Result Value Ref Range

## 2022-02-15 ENCOUNTER — Encounter: Payer: Self-pay | Admitting: Internal Medicine

## 2022-02-15 DIAGNOSIS — J9601 Acute respiratory failure with hypoxia: Secondary | ICD-10-CM

## 2022-02-15 DIAGNOSIS — N186 End stage renal disease: Secondary | ICD-10-CM | POA: Diagnosis not present

## 2022-02-15 DIAGNOSIS — I10 Essential (primary) hypertension: Secondary | ICD-10-CM

## 2022-02-15 DIAGNOSIS — R1084 Generalized abdominal pain: Secondary | ICD-10-CM | POA: Diagnosis not present

## 2022-02-15 DIAGNOSIS — R0602 Shortness of breath: Secondary | ICD-10-CM | POA: Diagnosis not present

## 2022-02-15 DIAGNOSIS — E1122 Type 2 diabetes mellitus with diabetic chronic kidney disease: Secondary | ICD-10-CM | POA: Diagnosis present

## 2022-02-15 DIAGNOSIS — Z888 Allergy status to other drugs, medicaments and biological substances status: Secondary | ICD-10-CM | POA: Diagnosis not present

## 2022-02-15 DIAGNOSIS — I480 Paroxysmal atrial fibrillation: Secondary | ICD-10-CM | POA: Diagnosis present

## 2022-02-15 DIAGNOSIS — Z20822 Contact with and (suspected) exposure to covid-19: Secondary | ICD-10-CM | POA: Diagnosis present

## 2022-02-15 DIAGNOSIS — Z6841 Body Mass Index (BMI) 40.0 and over, adult: Secondary | ICD-10-CM | POA: Diagnosis not present

## 2022-02-15 DIAGNOSIS — R112 Nausea with vomiting, unspecified: Secondary | ICD-10-CM | POA: Diagnosis not present

## 2022-02-15 DIAGNOSIS — G9341 Metabolic encephalopathy: Secondary | ICD-10-CM | POA: Diagnosis present

## 2022-02-15 DIAGNOSIS — N2581 Secondary hyperparathyroidism of renal origin: Secondary | ICD-10-CM | POA: Diagnosis present

## 2022-02-15 DIAGNOSIS — Z7901 Long term (current) use of anticoagulants: Secondary | ICD-10-CM | POA: Diagnosis not present

## 2022-02-15 DIAGNOSIS — J449 Chronic obstructive pulmonary disease, unspecified: Secondary | ICD-10-CM | POA: Diagnosis present

## 2022-02-15 DIAGNOSIS — E11649 Type 2 diabetes mellitus with hypoglycemia without coma: Secondary | ICD-10-CM | POA: Diagnosis not present

## 2022-02-15 DIAGNOSIS — Z880 Allergy status to penicillin: Secondary | ICD-10-CM | POA: Diagnosis not present

## 2022-02-15 DIAGNOSIS — K659 Peritonitis, unspecified: Secondary | ICD-10-CM | POA: Diagnosis present

## 2022-02-15 DIAGNOSIS — Z87891 Personal history of nicotine dependence: Secondary | ICD-10-CM | POA: Diagnosis not present

## 2022-02-15 DIAGNOSIS — Z992 Dependence on renal dialysis: Secondary | ICD-10-CM | POA: Diagnosis not present

## 2022-02-15 DIAGNOSIS — E8770 Fluid overload, unspecified: Secondary | ICD-10-CM | POA: Diagnosis present

## 2022-02-15 DIAGNOSIS — E1165 Type 2 diabetes mellitus with hyperglycemia: Secondary | ICD-10-CM | POA: Diagnosis present

## 2022-02-15 DIAGNOSIS — D631 Anemia in chronic kidney disease: Secondary | ICD-10-CM | POA: Diagnosis present

## 2022-02-15 DIAGNOSIS — Z66 Do not resuscitate: Secondary | ICD-10-CM | POA: Diagnosis present

## 2022-02-15 DIAGNOSIS — Z794 Long term (current) use of insulin: Secondary | ICD-10-CM | POA: Diagnosis not present

## 2022-02-15 DIAGNOSIS — Z885 Allergy status to narcotic agent status: Secondary | ICD-10-CM | POA: Diagnosis not present

## 2022-02-15 DIAGNOSIS — E876 Hypokalemia: Secondary | ICD-10-CM | POA: Diagnosis not present

## 2022-02-15 LAB — CBC
HCT: 31.1 % — ABNORMAL LOW (ref 36.0–46.0)
HCT: 29 % — ABNORMAL LOW (ref 36.0–46.0)
Hemoglobin: 9.7 g/dL — ABNORMAL LOW (ref 12.0–15.0)
Hemoglobin: 8.8 g/dL — ABNORMAL LOW (ref 12.0–15.0)
MCH: 31 pg (ref 26.0–34.0)
MCH: 30 pg (ref 26.0–34.0)
MCHC: 31.2 g/dL (ref 30.0–36.0)
MCHC: 30.3 g/dL (ref 30.0–36.0)
MCV: 99.4 fL (ref 80.0–100.0)
MCV: 99 fL (ref 80.0–100.0)
Platelets: 291 10*3/uL (ref 150–400)
Platelets: 262 10*3/uL (ref 150–400)
RBC: 3.13 MIL/uL — ABNORMAL LOW (ref 3.87–5.11)
RBC: 2.93 MIL/uL — ABNORMAL LOW (ref 3.87–5.11)
RDW: 14.3 % (ref 11.5–15.5)
RDW: 14.3 % (ref 11.5–15.5)
WBC: 13.7 10*3/uL — ABNORMAL HIGH (ref 4.0–10.5)
WBC: 8.6 10*3/uL (ref 4.0–10.5)
nRBC: 0 % (ref 0.0–0.2)
nRBC: 0 % (ref 0.0–0.2)

## 2022-02-15 LAB — BLOOD GAS, VENOUS
Acid-Base Excess: 5.2 mmol/L — ABNORMAL HIGH (ref 0.0–2.0)
Bicarbonate: 32.6 mmol/L — ABNORMAL HIGH (ref 20.0–28.0)
FIO2: 0.36 %
O2 Saturation: 62.8 %
Patient temperature: 37
pCO2, Ven: 59 mmHg (ref 44–60)
pH, Ven: 7.35 (ref 7.25–7.43)
pO2, Ven: 32 mmHg (ref 32–45)

## 2022-02-15 LAB — COMPREHENSIVE METABOLIC PANEL
ALT: 19 U/L (ref 0–44)
AST: 26 U/L (ref 15–41)
Albumin: 2 g/dL — ABNORMAL LOW (ref 3.5–5.0)
Alkaline Phosphatase: 77 U/L (ref 38–126)
Anion gap: 7 (ref 5–15)
BUN: 20 mg/dL (ref 8–23)
CO2: 31 mmol/L (ref 22–32)
Calcium: 8.7 mg/dL — ABNORMAL LOW (ref 8.9–10.3)
Chloride: 100 mmol/L (ref 98–111)
Creatinine, Ser: 3.97 mg/dL — ABNORMAL HIGH (ref 0.44–1.00)
GFR, Estimated: 11 mL/min — ABNORMAL LOW (ref >60)
Glucose, Bld: 248 mg/dL — ABNORMAL HIGH (ref 70–99)
Potassium: 4.2 mmol/L (ref 3.5–5.1)
Sodium: 138 mmol/L (ref 135–145)
Total Bilirubin: 0.6 mg/dL (ref 0.3–1.2)
Total Protein: 5.8 g/dL — ABNORMAL LOW (ref 6.5–8.1)

## 2022-02-15 LAB — CBG MONITORING, ED
Glucose-Capillary: 249 mg/dL — ABNORMAL HIGH (ref 70–99)
Glucose-Capillary: 247 mg/dL — ABNORMAL HIGH (ref 70–99)

## 2022-02-15 LAB — LACTIC ACID, PLASMA: Lactic Acid, Venous: 1 mmol/L (ref 0.5–1.9)

## 2022-02-15 LAB — GLUCOSE, CAPILLARY
Glucose-Capillary: 191 mg/dL — ABNORMAL HIGH (ref 70–99)
Glucose-Capillary: 212 mg/dL — ABNORMAL HIGH (ref 70–99)

## 2022-02-15 LAB — TROPONIN I (HIGH SENSITIVITY): Troponin I (High Sensitivity): 27 ng/L — ABNORMAL HIGH (ref <18)

## 2022-02-15 LAB — PROCALCITONIN: Procalcitonin: 0.28 ng/mL

## 2022-02-15 LAB — GAMMA GT: GGT: 15 U/L (ref 7–50)

## 2022-02-15 MED ORDER — DELFLEX-LC/2.5% DEXTROSE 394 MOSM/L IP SOLN
INTRAPERITONEAL | Status: DC
  Filled 2022-02-15 (×3): qty 3000

## 2022-02-15 MED ORDER — VANCOMYCIN VARIABLE DOSE PER UNSTABLE RENAL FUNCTION (PHARMACIST DOSING): Status: DC

## 2022-02-15 MED ORDER — ALBUTEROL SULFATE (2.5 MG/3ML) 0.083% IN NEBU: 2.5000 mg | INHALATION_SOLUTION | RESPIRATORY_TRACT | Status: DC | PRN

## 2022-02-15 MED ORDER — VANCOMYCIN HCL 2000 MG/400ML IV SOLN
2000.0000 mg | Freq: Once | INTRAVENOUS | Status: AC
  Administered 2022-02-15: 2000 mg via INTRAVENOUS
  Filled 2022-02-15: qty 400

## 2022-02-15 MED ORDER — FUROSEMIDE 10 MG/ML IJ SOLN
80.0000 mg | Freq: Every day | INTRAMUSCULAR | Status: DC
  Administered 2022-02-15 – 2022-02-22 (×6): 80 mg via INTRAVENOUS
  Filled 2022-02-15 (×7): qty 8

## 2022-02-15 MED ORDER — MOMETASONE FURO-FORMOTEROL FUM 100-5 MCG/ACT IN AERO
2.0000 | INHALATION_SPRAY | Freq: Two times a day (BID) | RESPIRATORY_TRACT | Status: DC
  Administered 2022-02-16 – 2022-02-22 (×13): 2 via RESPIRATORY_TRACT
  Filled 2022-02-15: qty 8.8

## 2022-02-15 MED ORDER — SODIUM CHLORIDE 0.9 % IV SOLN
1.0000 g | INTRAVENOUS | Status: DC
  Administered 2022-02-16: 1 g via INTRAVENOUS
  Filled 2022-02-15 (×5): qty 1

## 2022-02-15 MED ORDER — GENTAMICIN SULFATE 0.1 % EX CREA
1.0000 "application " | TOPICAL_CREAM | Freq: Every day | CUTANEOUS | Status: DC
  Administered 2022-02-16 – 2022-02-22 (×6): 1 via TOPICAL
  Filled 2022-02-15 (×2): qty 15

## 2022-02-15 MED ORDER — INSULIN ASPART 100 UNIT/ML IJ SOLN
0.0000 [IU] | Freq: Three times a day (TID) | INTRAMUSCULAR | Status: DC
  Administered 2022-02-15: 2 [IU] via SUBCUTANEOUS
  Administered 2022-02-16: 3 [IU] via SUBCUTANEOUS
  Administered 2022-02-16: 5 [IU] via SUBCUTANEOUS
  Administered 2022-02-16: 4 [IU] via SUBCUTANEOUS
  Administered 2022-02-17 (×2): 6 [IU] via SUBCUTANEOUS
  Filled 2022-02-15 (×6): qty 1

## 2022-02-15 MED ORDER — HEPARIN SODIUM (PORCINE) 5000 UNIT/ML IJ SOLN
5000.0000 [IU] | Freq: Three times a day (TID) | INTRAMUSCULAR | Status: DC
  Administered 2022-02-15 – 2022-02-16 (×4): 5000 [IU] via SUBCUTANEOUS
  Filled 2022-02-15 (×5): qty 1

## 2022-02-15 NOTE — Consult Note (Signed)
Pharmacy Antibiotic Note ? ?Andrea Stewart is a 76 y.o. female w/  h/o ESRD-PD, COPD, morbid obesity to ED c/o SOB and CC of abd'l pain admitted on 02/14/2022 with  intra-abdominal infection  (Ddx: c/f peritonitis VS portal vein thrombosis).  Pharmacy has been consulted for Vancomycin & ceftazidime dosing. ? ?Plan: ?Ceftazidime 1g q24h ?Vancomycin loading dose of 2g x1 now; then: ?F/u vancomycin random level at 3-5 days to determine appropriate timing of redosing. ?Plan for maintenance w/ 15-20 mg/kg doses --> Vanc 1500mg  to maintain >46mcg/mL. ? ?-- F/u MRSA PCR ordered & cultures. ?-- f/u a Vanc Random level: 4/06 @1000  ? ? ?Height: 4\' 11"  (149.9 cm) ?Weight: 88.9 kg (195 lb 15.8 oz) ?IBW/kg (Calculated) : 43.2 ? ?Temp (24hrs), Avg:98.7 ?F (37.1 ?C), Min:98.6 ?F (37 ?C), Max:98.7 ?F (37.1 ?C) ? ?Recent Labs  ?Lab 02/14/22 ?2132 02/15/22 ?6438  ?WBC 7.6 13.7*  ?CREATININE 3.78* 3.97*  ?LATICACIDVEN 2.2* 1.0  ?  ?Estimated Creatinine Clearance: 11.7 mL/min (A) (by C-G formula based on SCr of 3.97 mg/dL (H)).   ? ?Not on File ? ?Antimicrobials this admission: ?Vancomycin / Ceftazidime (4/03 >>  ? ?Dose adjustments this admission: ?ESRD-PD -- CTM based on levels at 3-5d for Vanco dosing. ? ?Microbiology results: ?4/03 BCx: sent ?4/03 Flu/Cov: negative  ?4/03 MRSA PCR: ordered ? ?Thank you for allowing pharmacy to be a part of this patient?s care. ? ?Lorna Dibble, PharmD, BCCP ?Clinical Pharmacist ?02/15/2022 2:11 PM ? ? ?

## 2022-02-15 NOTE — ED Notes (Signed)
Pt moved from cpod to room 19.  Pt alert  family with pt  nsr on monitor.  2 iv's in place  pure wick in place. Marland Kitchen ?

## 2022-02-15 NOTE — Assessment & Plan Note (Addendum)
Likely due to chronic disease. ?Hemoglobin stable.  Monitor CBC. ?

## 2022-02-15 NOTE — Plan of Care (Signed)
This patient moved to CPOD and is very lethargic and confused.  Husband states non-verbal and not eating is not baseline.  If she doesn't wake up and become more alert  - wouldn't even think about giving her food.  Notes indicate A/O x4 per triage RN.  Dr. Durward Fortes and asked to round as soon as able. ?

## 2022-02-15 NOTE — Assessment & Plan Note (Addendum)
Improved since admission, not fully resolved. ?Suspect peritonitis given peritoneal dialysis.  Patient was recently discharged with same but returned due to refractory symptoms at home.  Reportedly had rebound tenderness on admission. ?-- Treated with empiric intraperitoneal antibiotics, course completed ?-- Follow peritoneal fluid cultures until final - neg to date ?-- Supportive care - antiemetics, pain control,  ?-- PD per nephrology ? ? ?

## 2022-02-15 NOTE — Assessment & Plan Note (Addendum)
Uncontrolled, A1c is 10.8% ?--Close follow up for insulin adjustments ?--Levemir 22 units in the morning ?--Levemir to 28 units nightly ?--Prior home NovoLog 3-5 units TID WC ?

## 2022-02-15 NOTE — ED Notes (Signed)
Per dr Mal Misty, place pt on nasal cannula.  Pt continues to take mask off her face.  RT with pt.   Pt going to floor for admission.  Pt alert   family with pt.   ?

## 2022-02-15 NOTE — Progress Notes (Signed)
Inpatient Diabetes Program Recommendations ? ?AACE/ADA: New Consensus Statement on Inpatient Glycemic Control (2015) ? ?Target Ranges:  Prepandial:   less than 140 mg/dL ?     Peak postprandial:   less than 180 mg/dL (1-2 hours) ?     Critically ill patients:  140 - 180 mg/dL  ? ? Latest Reference Range & Units 02/15/22 01:06 02/15/22 11:11  ?Glucose-Capillary 70 - 99 mg/dL ? ?10 units NOVOLOG @2238  249 (H) 247 (H) ? ?2 units Novolog ?  ?(H): Data is abnormally high ? ? ?History: DM, ESRD  ? ?Home DM Meds: NPH 22 units AM/ 28 units PM  ?  Humalog 3-5 units TID  ? ? ?Current Orders: Novolog 0-6 units TID  ? ? ? ?Lab glu 501 last PM (AG 7/ CO2 29)--Given 10 units Novolog last PM for this Lab.   ? ?Pt Refused Novolog this AM ? ? ? ?MD- Pt may likely need a portion of her home long-acting insulin back  ? ?Please consider starting Levemir 12 units BID (50% home dose to start) ? ? ? ?--Will follow patient during hospitalization-- ? ?Wyn Quaker RN, MSN, CDE ?Diabetes Coordinator ?Inpatient Glycemic Control Team ?Team Pager: [phone] (8a-5p) ? ? ? ? ? ?

## 2022-02-15 NOTE — Progress Notes (Addendum)
Progress Note    Andrea Stewart  ZOX:[id number] DOB: [birth year]  DOA: 02/14/2022 PCP: Pcp, No      Brief Narrative:    Medical records reviewed and are as summarized below:  Andrea Stewart is a 76 y.o. female with medical history significant for ESRD on peritoneal dialysis, COPD, morbid obesity, hypertension, anemia of chronic disease, type 2 diabetes mellitus, who was brought to the hospital because of abdominal pain and shortness of breath.        Assessment/Plan:   Principal Problem:   Abdominal pain Active Problems:   SOB (shortness of breath)   End stage renal disease (HCC)   Essential hypertension   Hyperglycemia due to type 2 diabetes mellitus (HCC)   Anemia   Acute respiratory failure with hypoxia (HCC)   Body mass index is 39.58 kg/m.  (Obesity)  Abdominal pain, recent peritonitis: Obtain blood cultures.  Start empiric IV antibiotics (vancomycin and ceftazidime).  Analgesics as needed for pain.   Acute hypoxic respiratory failure: Patient was transitioned from oxygen via nasal cannula to BiPAP.  However she did not want to keep BiPAP on.  Continue oxygen via nasal cannula for now.  Altered mental status/acute metabolic encephalopathy: Continue supportive care  ESRD with fluid overload: Consulted Dr. Cherylann Ratel, nephrologist to assist with management.  Continue IV Lasix  COPD: Continue bronchodilators  Brittle type II DM with hyperglycemia: Use NovoLog as needed for hyperglycemia.  Insulin glargine has been held because of poor oral intake.  Monitor glucose levels closely and adjust insulin as needed.  History of hypertension: BP is borderline low.  Monitor closely.    Diet Order             Diet renal/carb modified with fluid restriction Diet-HS Snack? Nothing; Fluid restriction: 1200 mL Fluid; Room service appropriate? Yes; Fluid consistency: Thin  Diet effective now                       Consultants: Nephrologist  Procedures: None    Medications:    furosemide  80 mg Intravenous Daily   gentamicin cream  1 application. Topical Daily   heparin  5,000 Units Subcutaneous Q8H   insulin aspart  0-6 Units Subcutaneous TID WC   vancomycin variable dose per unstable renal function (pharmacist dosing)   Does not apply See admin instructions   Continuous Infusions:  cefTAZidime (FORTAZ)  IV     dialysis solution 2.5% low-MG/low-CA     vancomycin 2,000 mg (02/15/22 1645)     Anti-infectives (From admission, onward)    Start     Dose/Rate Route Frequency Ordered Stop   02/15/22 1415  cefTAZidime (FORTAZ) 1 g in sodium chloride 0.9 % 100 mL IVPB        1 g 200 mL/hr over 30 Minutes Intravenous Every 24 hours 02/15/22 1408     02/15/22 1415  vancomycin (VANCOREADY) IVPB 2000 mg/400 mL        2,000 mg 200 mL/hr over 120 Minutes Intravenous  Once 02/15/22 1408     02/15/22 1406  vancomycin variable dose per unstable renal function (pharmacist dosing)         Does not apply See admin instructions 02/15/22 1408                Family Communication/Anticipated D/C date and plan/Code Status   DVT prophylaxis: heparin injection 5,000 Units Start: 02/15/22 0100 SCDs Start: 02/15/22 0039     Code Status: Full Code  Family Communication: Plan discussed with her husband at the bedside Disposition Plan: Plan to discharge home in 2 to 3 days  Status is: Inpatient Remains inpatient appropriate because: On IV antibiotics         Subjective:   Interval events noted.  Patient is lethargic and unable to provide any history.  Her husband was at the bedside  Objective:    Vitals:   02/15/22 1605 02/15/22 1615 02/15/22 1630 02/15/22 1645  BP: (!) 108/51   (!) 107/52  Pulse: 79 (!) 41 73 67  Resp: 20 (!) 24 19 13   Temp:      TempSrc:      SpO2: 100% 98% 100% 98%  Weight:      Height:       No data found.  No intake or output data in the 24  hours ending 02/15/22 1741 Filed Weights   02/14/22 2126  Weight: 88.9 kg    Exam:  GEN: NAD SKIN: Warm and dry EYES: No pallor or icterus ENT: MMM CV: RRR PULM: CTA B ABD: soft, obese, diffuse tenderness, no rebound tenderness or guarding, +BS CNS: Lethargic, non focal EXT: Edema of b/l upper and lower extremities,         Data Reviewed:   I have personally reviewed following labs and imaging studies:  Labs: Labs show the following:   Basic Metabolic Panel: Recent Labs  Lab 02/14/22 2132 02/15/22 0514  NA 136 138  K 4.4 4.2  CL 100 100  CO2 29 31  GLUCOSE 501* 248*  BUN 19 20  CREATININE 3.78* 3.97*  CALCIUM 8.6* 8.7*   GFR Estimated Creatinine Clearance: 11.7 mL/min (A) (by C-G formula based on SCr of 3.97 mg/dL (H)). Liver Function Tests: Recent Labs  Lab 02/14/22 2132 02/15/22 0514  AST 31 26  ALT 22 19  ALKPHOS 83 77  BILITOT 0.6 0.6  PROT 6.1* 5.8*  ALBUMIN 2.0* 2.0*   No results for input(s): LIPASE, AMYLASE in the last 168 hours. No results for input(s): AMMONIA in the last 168 hours. Coagulation profile No results for input(s): INR, PROTIME in the last 168 hours.  CBC: Recent Labs  Lab 02/14/22 2132 02/15/22 0514 02/15/22 1615  WBC 7.6 13.7* 8.6  HGB 10.4* 9.7* 8.8*  HCT 33.5* 31.1* 29.0*  MCV 98.0 99.4 99.0  PLT 305 291 262   Cardiac Enzymes: No results for input(s): CKTOTAL, CKMB, CKMBINDEX, TROPONINI in the last 168 hours. BNP (last 3 results) No results for input(s): PROBNP in the last 8760 hours. CBG: Recent Labs  Lab 02/15/22 0106 02/15/22 1111  GLUCAP 249* 247*   D-Dimer: No results for input(s): DDIMER in the last 72 hours. Hgb A1c: No results for input(s): HGBA1C in the last 72 hours. Lipid Profile: No results for input(s): CHOL, HDL, LDLCALC, TRIG, CHOLHDL, LDLDIRECT in the last 72 hours. Thyroid function studies: No results for input(s): TSH, T4TOTAL, T3FREE, THYROIDAB in the last 72 hours.  Invalid  input(s): FREET3 Anemia work up: No results for input(s): VITAMINB12, FOLATE, FERRITIN, TIBC, IRON, RETICCTPCT in the last 72 hours. Sepsis Labs: Recent Labs  Lab 02/14/22 2132 02/15/22 0514 02/15/22 1615  PROCALCITON 0.28  --   --   WBC 7.6 13.7* 8.6  LATICACIDVEN 2.2* 1.0  --     Microbiology Recent Results (from the past 240 hour(s))  Resp Panel by RT-PCR (Flu A&B, Covid) Nasopharyngeal Swab     Status: None   Collection Time: 02/14/22 10:07 PM   Specimen:  Nasopharyngeal Swab; Nasopharyngeal(NP) swabs in vial transport medium  Result Value Ref Range Status   SARS Coronavirus 2 by RT PCR NEGATIVE NEGATIVE Final    Comment: (NOTE) SARS-CoV-2 target nucleic acids are NOT DETECTED.  The SARS-CoV-2 RNA is generally detectable in upper respiratory specimens during the acute phase of infection. The lowest concentration of SARS-CoV-2 viral copies this assay can detect is 138 copies/mL. A negative result does not preclude SARS-Cov-2 infection and should not be used as the sole basis for treatment or other patient management decisions. A negative result may occur with  improper specimen collection/handling, submission of specimen other than nasopharyngeal swab, presence of viral mutation(s) within the areas targeted by this assay, and inadequate number of viral copies(<138 copies/mL). A negative result must be combined with clinical observations, patient history, and epidemiological information. The expected result is Negative.  Fact Sheet for Patients:  BloggerCourse.com  Fact Sheet for Healthcare Providers:  SeriousBroker.it  This test is no t yet approved or cleared by the [place] FDA and  has been authorized for detection and/or diagnosis of SARS-CoV-2 by FDA under an Emergency Use Authorization (EUA). This EUA will remain  in effect (meaning this test can be used) for the duration of the COVID-19 declaration under  Section 564(b)(1) of the Act, 21 U.S.C.section 360bbb-3(b)(1), unless the authorization is terminated  or revoked sooner.       Influenza A by PCR NEGATIVE NEGATIVE Final   Influenza B by PCR NEGATIVE NEGATIVE Final    Comment: (NOTE) The Xpert Xpress SARS-CoV-2/FLU/RSV plus assay is intended as an aid in the diagnosis of influenza from Nasopharyngeal swab specimens and should not be used as a sole basis for treatment. Nasal washings and aspirates are unacceptable for Xpert Xpress SARS-CoV-2/FLU/RSV testing.  Fact Sheet for Patients: BloggerCourse.com  Fact Sheet for Healthcare Providers: SeriousBroker.it  This test is not yet approved or cleared by the [place] FDA and has been authorized for detection and/or diagnosis of SARS-CoV-2 by FDA under an Emergency Use Authorization (EUA). This EUA will remain in effect (meaning this test can be used) for the duration of the COVID-19 declaration under Section 564(b)(1) of the Act, 21 U.S.C. section 360bbb-3(b)(1), unless the authorization is terminated or revoked.  Performed at [facility], [address] [place], [place] [address]     Procedures and diagnostic studies:  CT ABDOMEN PELVIS WO CONTRAST  Result Date: 02/15/2022 CLINICAL DATA:  Abdominal pain, acute, nonlocalized. End-stage renal disease, peritoneal dialysis. Shortness of breath. EXAM: CT ABDOMEN AND PELVIS WITHOUT CONTRAST TECHNIQUE: Multidetector CT imaging of the abdomen and pelvis was performed following the standard protocol without IV contrast. RADIATION DOSE REDUCTION: This exam was performed according to the departmental dose-optimization program which includes automated exposure control, adjustment of the mA and/or kV according to patient size and/or use of iterative reconstruction technique. COMPARISON:  02/09/2022 FINDINGS: Lower chest: Small bilateral pleural effusions, similar to prior study.  Dependent compressive atelectasis in the lower lobes. Heart is normal size. Aortic and coronary artery calcifications. Hepatobiliary: No focal liver abnormality is seen. No biliary dilatation. Gallbladder not well visualized, presumably contracted. Pancreas: Atrophic.  No visible mass. Spleen: No focal abnormality.  Normal size. Adrenals/Urinary Tract: Prior left nephrectomy. No renal stone, mass or hydronephrosis on the right. Adrenal glands unremarkable. Urinary bladder decompressed, grossly unremarkable. Stomach/Bowel: Stomach, large and small bowel grossly unremarkable. Vascular/Lymphatic: Aortoiliac and branch vessel atherosclerosis. No evidence of aneurysm or adenopathy. Reproductive: Prior hysterectomy.  No adnexal masses. Other: Peritoneal  dialysis catheter in the pelvis. Small amount of ascites in the abdomen or pelvis. Few locules of free intraperitoneal air in the upper abdomen, less than prior study. This has likely been introduced through the peritoneal dialysis catheter. Musculoskeletal: No acute bony abnormality. Postoperative changes in the lumbar spine. IMPRESSION: Small bilateral pleural effusions with compressive atelectasis in the lower lobes. Peritoneal dialysis catheter in the pelvis, unchanged. Small amount of free fluid and free air related to the dialysis catheter. Aortic atherosclerosis. No acute findings. Electronically Signed   By: Charlett Nose M.D.   On: 02/15/2022 00:17   DG Chest Portable 1 View  Result Date: 02/14/2022 CLINICAL DATA:  Shortness of breath EXAM: PORTABLE CHEST 1 VIEW COMPARISON:  02/09/2022 FINDINGS: Heart is borderline in size. Peribronchial thickening and interstitial prominence. Right base opacity, likely atelectasis. No effusions or acute bony abnormality. IMPRESSION: Interstitial prominence may reflect interstitial edema. Right base atelectasis. Electronically Signed   By: Charlett Nose M.D.   On: 02/14/2022 22:22               LOS: 0 days   Marlise Fahr  [facility]   Pager on [email or web address]. If 7PM-7AM, please contact night-coverage at [email or web address]     02/15/2022, 5:41 PM

## 2022-02-15 NOTE — ED Notes (Signed)
Report messaged to [place] c rn floor nurse ?

## 2022-02-15 NOTE — ED Notes (Signed)
RT at bedside.

## 2022-02-15 NOTE — Assessment & Plan Note (Addendum)
On peritoneal dialysis, presenting with peritonitis.  Nephrology following. ?Discussions underway regarding transition to hemodialysis.  Patient has been declining this, continuing to discuss with husband.  She cites her brother's experience with hemodialysis was miserable, does not want to put herself through that. ?-- See nephrology recommendations ?-- Continue PD ? ? ?

## 2022-02-15 NOTE — Progress Notes (Signed)
?[facility]  ?ROUNDING NOTE  ? ?Subjective:  ? ?Andrea Stewart is a 76 year old female with a past medical concerns including COPD, hypertension, depression, diabetes and end stage renal disease. Patient presents to ED with complaints vomiting, diarrhea and abdominal pain. She has been admitted for Abdominal pain [R10.9] ?Acute respiratory failure with hypoxia ([place]) [J96.01] ? ?She is known to our practice and receives outpatient follow-up with Dr. Juleen China.  Patient was recently admitted and discharged yesterday due to abdominal pain, increased swelling and shortness of breath.  Patient was suspected to have peritonitis and was discharged with an outpatient plan to follow-up with outpatient dialysis clinic to receive antibiotics with peritoneal dialysis treatments.  Patient is seen resting in bed appears chronically ill.  Husband at bedside.  Husband states they discharged and attempted to provide scheduled peritoneal dialysis overnight.  As soon as patient was connected to machine, she began to complain of severe abdominal pain.  Husband states no treatment was received and all he knew to do with bring her back to the hospital.  Patient very drowsy and unable to participate with visit.  She is currently on 4 L nasal cannula with sats mid to upper 90s.  Edema greater in upper extremities than lower.  Abdominal guarding with palpation. ? ?Peritoneal dialysis sample obtained during previous admission with negative culture. Labs on ED arrival include glucose 248 and creatinine 3.97 with GFR 11. WBC elevated at 13.7.  ? ?We have been consulted to manage dialysis needs during this admission ? ? ?Objective:  ?Vital signs in last 24 hours:  ?Temp:  [98.6 ?F (37 ?C)-98.7 ?F (37.1 ?C)] 98.7 ?F (37.1 ?C) (04/03 1300) ?Pulse Rate:  [41-100] 67 (04/03 1645) ?Resp:  [13-29] 13 (04/03 1645) ?BP: (105-167)/(51-110) 107/52 (04/03 1645) ?SpO2:  [90 %-100 %] 98 % (04/03 1645) ?Weight:  [88.9 kg] 88.9 kg (04/02  2126) ? ?Weight change:  ?Filed Weights  ? 02/14/22 2126  ?Weight: 88.9 kg  ? ? ?Intake/Output: ?No intake/output data recorded. ?  ?Intake/Output this shift: ? No intake/output data recorded. ? ?Physical Exam: ?General: Chronically ill appearing  ?Head: Normocephalic, atraumatic. Moist oral mucosal membranes  ?Eyes: Anicteric  ?Neck: Supple  ?Lungs:  Clear to auscultation, normal effort  ?Heart: Regular rate and rhythm  ?Abdomen:  Soft, nontender, PD catheter  ?Extremities:  1+BLE, 2+ BUE peripheral edema.  ?Neurologic: Lethargic  ?Skin: No lesions  ?Access: PD catheter  ? ? ?Basic Metabolic Panel: ?Recent Labs  ?Lab 02/14/22 ?2132 02/15/22 ?2353  ?NA 136 138  ?K 4.4 4.2  ?CL 100 100  ?CO2 29 31  ?GLUCOSE 501* 248*  ?BUN 19 20  ?CREATININE 3.78* 3.97*  ?CALCIUM 8.6* 8.7*  ? ? ?Liver Function Tests: ?Recent Labs  ?Lab 02/14/22 ?2132 02/15/22 ?6144  ?AST 31 26  ?ALT 22 19  ?ALKPHOS 83 77  ?BILITOT 0.6 0.6  ?PROT 6.1* 5.8*  ?ALBUMIN 2.0* 2.0*  ? ?No results for input(s): LIPASE, AMYLASE in the last 168 hours. ?No results for input(s): AMMONIA in the last 168 hours. ? ?CBC: ?Recent Labs  ?Lab 02/14/22 ?2132 02/15/22 ?3154  ?WBC 7.6 13.7*  ?HGB 10.4* 9.7*  ?HCT 33.5* 31.1*  ?MCV 98.0 99.4  ?PLT 305 291  ? ? ?Cardiac Enzymes: ?No results for input(s): CKTOTAL, CKMB, CKMBINDEX, TROPONINI in the last 168 hours. ? ?BNP: ?Invalid input(s): POCBNP ? ?CBG: ?Recent Labs  ?Lab 02/15/22 ?0106 02/15/22 ?1111  ?GLUCAP 249* 247*  ? ? ?Microbiology: ?Results for orders placed or performed during  the hospital encounter of 02/14/22  ?Resp Panel by RT-PCR (Flu A&B, Covid) Nasopharyngeal Swab     Status: None  ? Collection Time: 02/14/22 10:07 PM  ? Specimen: Nasopharyngeal Swab; Nasopharyngeal(NP) swabs in vial transport medium  ?Result Value Ref Range Status  ? SARS Coronavirus 2 by RT PCR NEGATIVE NEGATIVE Final  ?  Comment: (NOTE) ?SARS-CoV-2 target nucleic acids are NOT DETECTED. ? ?The SARS-CoV-2 RNA is generally detectable in  upper respiratory ?specimens during the acute phase of infection. The lowest ?concentration of SARS-CoV-2 viral copies this assay can detect is ?138 copies/mL. A negative result does not preclude SARS-Cov-2 ?infection and should not be used as the sole basis for treatment or ?other patient management decisions. A negative result may occur with  ?improper specimen collection/handling, submission of specimen other ?than nasopharyngeal swab, presence of viral mutation(s) within the ?areas targeted by this assay, and inadequate number of viral ?copies(<138 copies/mL). A negative result must be combined with ?clinical observations, patient history, and epidemiological ?information. The expected result is Negative. ? ?Fact Sheet for Patients:  ?EntrepreneurPulse.com.au ? ?Fact Sheet for Healthcare Providers:  ?IncredibleEmployment.be ? ?This test is no t yet approved or cleared by the [place] FDA and  ?has been authorized for detection and/or diagnosis of SARS-CoV-2 by ?FDA under an Emergency Use Authorization (EUA). This EUA will remain  ?in effect (meaning this test can be used) for the duration of the ?COVID-19 declaration under Section 564(b)(1) of the Act, 21 ?U.S.C.section 360bbb-3(b)(1), unless the authorization is terminated  ?or revoked sooner.  ? ? ?  ? Influenza A by PCR NEGATIVE NEGATIVE Final  ? Influenza B by PCR NEGATIVE NEGATIVE Final  ?  Comment: (NOTE) ?The Xpert Xpress SARS-CoV-2/FLU/RSV plus assay is intended as an aid ?in the diagnosis of influenza from Nasopharyngeal swab specimens and ?should not be used as a sole basis for treatment. Nasal washings and ?aspirates are unacceptable for Xpert Xpress SARS-CoV-2/FLU/RSV ?testing. ? ?Fact Sheet for Patients: ?EntrepreneurPulse.com.au ? ?Fact Sheet for Healthcare Providers: ?IncredibleEmployment.be ? ?This test is not yet approved or cleared by the [place] FDA and ?has been  authorized for detection and/or diagnosis of SARS-CoV-2 by ?FDA under an Emergency Use Authorization (EUA). This EUA will remain ?in effect (meaning this test can be used) for the duration of the ?COVID-19 declaration under Section 564(b)(1) of the Act, 21 U.S.C. ?section 360bbb-3(b)(1), unless the authorization is terminated or ?revoked. ? ?Performed at [facility], [place], ?[name] [address] ?  ? ? ?Coagulation Studies: ?No results for input(s): LABPROT, INR in the last 72 hours. ? ?Urinalysis: ?No results for input(s): COLORURINE, LABSPEC, [place], GLUCOSEU, HGBUR, BILIRUBINUR, KETONESUR, PROTEINUR, UROBILINOGEN, NITRITE, LEUKOCYTESUR in the last 72 hours. ? ?Invalid input(s): APPERANCEUR  ? ? ?Imaging: ?CT ABDOMEN PELVIS WO CONTRAST ? ?Result Date: 02/15/2022 ?CLINICAL DATA:  Abdominal pain, acute, nonlocalized. End-stage renal disease, peritoneal dialysis. Shortness of breath. EXAM: CT ABDOMEN AND PELVIS WITHOUT CONTRAST TECHNIQUE: Multidetector CT imaging of the abdomen and pelvis was performed following the standard protocol without IV contrast. RADIATION DOSE REDUCTION: This exam was performed according to the departmental dose-optimization program which includes automated exposure control, adjustment of the mA and/or kV according to patient size and/or use of iterative reconstruction technique. COMPARISON:  02/09/2022 FINDINGS: Lower chest: Small bilateral pleural effusions, similar to prior study. Dependent compressive atelectasis in the lower lobes. Heart is normal size. Aortic and coronary artery calcifications. Hepatobiliary: No focal liver abnormality is seen. No biliary dilatation. Gallbladder  not well visualized, presumably contracted. Pancreas: Atrophic.  No visible mass. Spleen: No focal abnormality.  Normal size. Adrenals/Urinary Tract: Prior left nephrectomy. No renal stone, mass or hydronephrosis on the right. Adrenal glands unremarkable. Urinary bladder decompressed,  grossly unremarkable. Stomach/Bowel: Stomach, large and small bowel grossly unremarkable. Vascular/Lymphatic: Aortoiliac and branch vessel atherosclerosis. No evidence of aneurysm or adenopathy. Reproductive: [place]

## 2022-02-15 NOTE — Assessment & Plan Note (Addendum)
Due to volume overload in the setting of ESRD.   ?Treated w Lasix and peritoneal dialysis  ?Nephrology consult.  ?Supplemental oxygen as needed. ?Clinically improved. ?  ? ?

## 2022-02-15 NOTE — Assessment & Plan Note (Addendum)
BP is overall controlled. ?-- Lasix per nephrology ?

## 2022-02-15 NOTE — ED Notes (Signed)
Meds given.   

## 2022-02-16 ENCOUNTER — Inpatient Hospital Stay (HOSPITAL_COMMUNITY)
Admit: 2022-02-16 | Discharge: 2022-02-16 | Disposition: A | Discharge status: Home / Self Care | Payer: PPO | Attending: Internal Medicine | Admitting: Internal Medicine

## 2022-02-16 DIAGNOSIS — E1165 Type 2 diabetes mellitus with hyperglycemia: Secondary | ICD-10-CM

## 2022-02-16 DIAGNOSIS — R0602 Shortness of breath: Secondary | ICD-10-CM

## 2022-02-16 DIAGNOSIS — J9601 Acute respiratory failure with hypoxia: Secondary | ICD-10-CM | POA: Diagnosis not present

## 2022-02-16 DIAGNOSIS — N186 End stage renal disease: Secondary | ICD-10-CM | POA: Diagnosis not present

## 2022-02-16 DIAGNOSIS — R1084 Generalized abdominal pain: Secondary | ICD-10-CM | POA: Diagnosis not present

## 2022-02-16 DIAGNOSIS — Z794 Long term (current) use of insulin: Secondary | ICD-10-CM

## 2022-02-16 LAB — CBC
HCT: 28.7 % — ABNORMAL LOW (ref 36.0–46.0)
Hemoglobin: 8.7 g/dL — ABNORMAL LOW (ref 12.0–15.0)
MCH: 30.4 pg (ref 26.0–34.0)
MCHC: 30.3 g/dL (ref 30.0–36.0)
MCV: 100.3 fL — ABNORMAL HIGH (ref 80.0–100.0)
Platelets: 240 10*3/uL (ref 150–400)
RBC: 2.86 MIL/uL — ABNORMAL LOW (ref 3.87–5.11)
RDW: 14.1 % (ref 11.5–15.5)
WBC: 7.6 10*3/uL (ref 4.0–10.5)
nRBC: 0 % (ref 0.0–0.2)

## 2022-02-16 LAB — BODY FLUID CELL COUNT WITH DIFFERENTIAL
Eos, Fluid: 2 %
Lymphs, Fluid: 7 %
Monocyte-Macrophage-Serous Fluid: 2 %
Neutrophil Count, Fluid: 89 %
Total Nucleated Cell Count, Fluid: 21679 cu mm

## 2022-02-16 LAB — ECHOCARDIOGRAM COMPLETE
AR max vel: 2.04 cm2
AV Area VTI: 1.93 cm2
AV Area mean vel: 1.64 cm2
AV Mean grad: 5 mmHg
AV Peak grad: 8.5 mmHg
Ao pk vel: 1.46 m/s
Area-P 1/2: 3.36 cm2
Height: 59 in
MV VTI: 1.64 cm2
S' Lateral: 4.23 cm
Weight: 3227.53 oz

## 2022-02-16 LAB — BASIC METABOLIC PANEL
Anion gap: 9 (ref 5–15)
BUN: 28 mg/dL — ABNORMAL HIGH (ref 8–23)
CO2: 27 mmol/L (ref 22–32)
Calcium: 8.4 mg/dL — ABNORMAL LOW (ref 8.9–10.3)
Chloride: 101 mmol/L (ref 98–111)
Creatinine, Ser: 5.24 mg/dL — ABNORMAL HIGH (ref 0.44–1.00)
GFR, Estimated: 8 mL/min — ABNORMAL LOW (ref >60)
Glucose, Bld: 263 mg/dL — ABNORMAL HIGH (ref 70–99)
Potassium: 4.5 mmol/L (ref 3.5–5.1)
Sodium: 137 mmol/L (ref 135–145)

## 2022-02-16 LAB — GLUCOSE, CAPILLARY
Glucose-Capillary: 255 mg/dL — ABNORMAL HIGH (ref 70–99)
Glucose-Capillary: 316 mg/dL — ABNORMAL HIGH (ref 70–99)
Glucose-Capillary: 354 mg/dL — ABNORMAL HIGH (ref 70–99)
Glucose-Capillary: 271 mg/dL — ABNORMAL HIGH (ref 70–99)

## 2022-02-16 MED ORDER — PANTOPRAZOLE SODIUM 40 MG PO TBEC
40.0000 mg | DELAYED_RELEASE_TABLET | Freq: Two times a day (BID) | ORAL | Status: DC
  Administered 2022-02-16 – 2022-02-22 (×13): 40 mg via ORAL
  Filled 2022-02-16 (×13): qty 1

## 2022-02-16 MED ORDER — HYDROXYZINE HCL 10 MG PO TABS
10.0000 mg | ORAL_TABLET | Freq: Three times a day (TID) | ORAL | Status: DC | PRN
  Administered 2022-02-16 – 2022-02-22 (×5): 10 mg via ORAL
  Filled 2022-02-16 (×8): qty 1

## 2022-02-16 MED ORDER — POLYETHYLENE GLYCOL 3350 17 G PO PACK: 17.0000 g | PACK | Freq: Every day | ORAL | Status: DC | PRN

## 2022-02-16 MED ORDER — METOPROLOL SUCCINATE ER 25 MG PO TB24
25.0000 mg | ORAL_TABLET | Freq: Every day | ORAL | Status: DC
  Administered 2022-02-16 – 2022-02-22 (×7): 25 mg via ORAL
  Filled 2022-02-16 (×7): qty 1

## 2022-02-16 MED ORDER — ATORVASTATIN CALCIUM 20 MG PO TABS
20.0000 mg | ORAL_TABLET | Freq: Every day | ORAL | Status: DC
  Administered 2022-02-16 – 2022-02-22 (×7): 20 mg via ORAL
  Filled 2022-02-16 (×7): qty 1

## 2022-02-16 MED ORDER — MELATONIN 5 MG PO TABS
5.0000 mg | ORAL_TABLET | Freq: Every evening | ORAL | Status: DC | PRN
  Administered 2022-02-18 (×2): 5 mg via ORAL
  Filled 2022-02-16 (×3): qty 1

## 2022-02-16 MED ORDER — SENNA 8.6 MG PO TABS: 8.6000 mg | ORAL_TABLET | Freq: Every day | ORAL | Status: DC | PRN

## 2022-02-16 MED ORDER — ASPIRIN EC 81 MG PO TBEC
81.0000 mg | DELAYED_RELEASE_TABLET | Freq: Every day | ORAL | Status: DC
  Administered 2022-02-16 – 2022-02-22 (×7): 81 mg via ORAL
  Filled 2022-02-16 (×7): qty 1

## 2022-02-16 MED ORDER — INSULIN DETEMIR 100 UNIT/ML ~~LOC~~ SOLN
12.0000 [IU] | Freq: Two times a day (BID) | SUBCUTANEOUS | Status: DC
  Administered 2022-02-16 – 2022-02-17 (×2): 12 [IU] via SUBCUTANEOUS
  Filled 2022-02-16 (×3): qty 0.12

## 2022-02-16 MED ORDER — APIXABAN 5 MG PO TABS
5.0000 mg | ORAL_TABLET | Freq: Two times a day (BID) | ORAL | Status: DC
  Administered 2022-02-16 – 2022-02-22 (×12): 5 mg via ORAL
  Filled 2022-02-16 (×12): qty 1

## 2022-02-16 NOTE — Evaluation (Signed)
Physical Therapy Evaluation ?Patient Details ?Name: Andrea Stewart ?MRN: [id number] ?DOB: 1946-02-12 ?Today's Date: 02/16/2022 ? ?History of Present Illness ? Patient is a 76 y.o.  female with a past medical concerns including COPD, hypertension, depression, diabetes and end stage renal disease. Patient presents to ED with complaints vomiting, diarrhea and abdominal pain. Admitted for shortness of breath, acute respiratory failure with hypoxia, abdominal pain, hypervolemia  ?Clinical Impression ? Patient agreeable to PT. Spouse at the bedside. Patient ambulates very limited distance at baseline and has physical assistance from spouse with mobility at home. Today, she needs assistance for bed mobility. She declined standing due to fatigue with activity after sitting up for 5-6 minutes. Good sitting balance and no pain is reported with mobility. She is generally deconditioned and could benefit from continued PT to maximize independence and decrease caregiver burden. The patient is adamant about returning home at discharge and is not interested in going to SNF at this time. Recommend home with maximal home health services and continued physical assistance from family with mobility.  ?   ? ?Recommendations for follow up therapy are one component of a multi-disciplinary discharge planning process, led by the attending physician.  Recommendations may be updated based on patient status, additional functional criteria and insurance authorization. ? ?Follow Up Recommendations Home health PT ? ?  ?Assistance Recommended at Discharge Frequent or constant Supervision/Assistance  ?Patient can return home with the following ? A lot of help with walking and/or transfers;A lot of help with bathing/dressing/bathroom;Help with stairs or ramp for entrance;Assist for transportation;Assistance with cooking/housework ? ?  ?Equipment Recommendations None recommended by PT  ?Recommendations for Other Services ?    ?  ?Functional Status  Assessment Patient has had a recent decline in their functional status and demonstrates the ability to make significant improvements in function in a reasonable and predictable amount of time.  ? ?  ?Precautions / Restrictions Precautions ?Precautions: Fall ?Restrictions ?Weight Bearing Restrictions: No  ? ?  ? ?Mobility ? Bed Mobility ?Overal bed mobility: Needs Assistance ?Bed Mobility: Supine to Sit, Sit to Supine ?  ?  ?Supine to sit: Min assist ?Sit to supine: Mod assist ?  ?General bed mobility comments: increased assistance for LE support required for return to bed. ?  ? ?Transfers ?  ?  ?  ?  ?  ?  ?  ?  ?  ?General transfer comment: patient declined standing due to fatigue, requesting to return to bed after sitting up ~ 5-6 minutes ?  ? ?Ambulation/Gait ?  ?  ?  ?  ?  ?  ?  ?  ? ?Stairs ?  ?  ?  ?  ?  ? ?Wheelchair Mobility ?  ? ?Modified Rankin (Stroke Patients Only) ?  ? ?  ? ?Balance Overall balance assessment: Needs assistance ?Sitting-balance support: Feet supported, Bilateral upper extremity supported ?Sitting balance-Leahy Scale: Good ?  ?  ?  ?  ?  ?  ?  ?  ?  ?  ?  ?  ?  ?  ?  ?  ?   ? ? ? ?Pertinent Vitals/Pain Pain Assessment ?Pain Assessment: No/denies pain  ? ? ?Home Living Family/patient expects to be discharged to:: Private residence ?Living Arrangements: Spouse/significant other ?Available Help at Discharge: Available 24 hours/day (spouse and 2 sons help at home as needed in the past) ?Type of Home: House ?Home Access: Ramped entrance ?  ?  ?  ?Home Layout: One level ?Home Equipment: Conservation officer, nature (  2 wheels);Transport chair;Grab bars - tub/shower;Grab bars - toilet;Cane - single point;Other (comment);Rollator (4 wheels) (lift chair, bed rail) ?   ?  ?Prior Function Prior Level of Function : Needs assist ?  ?  ?  ?  ?  ?  ?Mobility Comments: very limited ambulation at baseline. patient has needed assistance with getting out of bed, standing, walking ?ADLs Comments: assistance from spouse ?   ? ? ?Hand Dominance  ? Dominant Hand: Right ? ?  ?Extremity/Trunk Assessment  ? Upper Extremity Assessment ?Upper Extremity Assessment: Generalized weakness ?  ? ?Lower Extremity Assessment ?Lower Extremity Assessment: Generalized weakness ?  ? ?   ?Communication  ? Communication: No difficulties  ?Cognition Arousal/Alertness: Awake/alert ?Behavior During Therapy: [facility] for tasks assessed/performed ?Overall Cognitive Status: Within Functional Limits for tasks assessed ?  ?  ?  ?  ?  ?  ?  ?  ?  ?  ?  ?  ?  ?  ?  ?  ?General Comments: patient is able to follow single step commands consistently today. she is awake, alert, cooperative, and telling jokes at times ?  ?  ? ?  ?General Comments General comments (skin integrity, edema, etc.): Sp02 between 95-100% on 4 L02. no shortenss of breath is noted with functional activity, however patient is fatigued with activity ? ?  ?Exercises    ? ?Assessment/Plan  ?  ?PT Assessment Patient needs continued PT services  ?PT Problem List Decreased strength;Decreased range of motion;Decreased activity tolerance;Decreased balance;Decreased mobility ? ?   ?  ?PT Treatment Interventions DME instruction;Gait training;Functional mobility training;Therapeutic activities;Therapeutic exercise;Balance training;Neuromuscular re-education;Patient/family education;Wheelchair mobility training   ? ?PT Goals (Current goals can be found in the Care Plan section)  ?Acute Rehab PT Goals ?Patient Stated Goal: to return home ?PT Goal Formulation: With patient/family ?Time For Goal Achievement: 03/02/22 ?Potential to Achieve Goals: Fair ? ?  ?Frequency Min 2X/week ?  ? ? ?Co-evaluation   ?  ?  ?  ?  ? ? ?  ?AM-PAC PT "6 Clicks" Mobility  ?Outcome Measure Help needed turning from your back to your side while in a flat bed without using bedrails?: A Little ?Help needed moving from lying on your back to sitting on the side of a flat bed without using bedrails?: A Lot ?Help needed moving to and from a bed  to a chair (including a wheelchair)?: A Lot ?Help needed standing up from a chair using your arms (e.g., wheelchair or bedside chair)?: A Lot ?Help needed to walk in hospital room?: A Lot ?Help needed climbing 3-5 steps with a railing? : Total ?6 Click Score: 12 ? ?  ?End of Session Equipment Utilized During Treatment: Oxygen ?Activity Tolerance: Patient tolerated treatment well ?Patient left: in bed;with call bell/phone within reach;with bed alarm set;with family/visitor present ?  ?PT Visit Diagnosis: Unsteadiness on feet (R26.81);Muscle weakness (generalized) (M62.81) ?  ? ?Time: 312 [id number] 1308 ?PT Time Calculation (min) (ACUTE ONLY): 30 min ? ? ?Charges:   PT Evaluation ?$PT Eval Moderate Complexity: 1 Mod ?PT Treatments ?$Therapeutic Activity: 8-22 mins ?  ?   ? ?Minna Merritts, PT, MPT ? ?Percell Locus ?02/16/2022, 12:35 PM ? ?

## 2022-02-16 NOTE — Progress Notes (Addendum)
? ? ? ?Progress Note  ? ? ?Andrea Stewart  KVQ:[id number] DOB: 03/12/1946  DOA: 02/14/2022 ?PCP: Pcp, No  ? ? ? ? ?Brief Narrative:  ? ? ?Medical records reviewed and are as summarized below: ? ?Andrea Stewart is a 76 y.o. female with medical history significant for recent discharge from the hospital on 02/14/2022 after hospitalization for atrial fibrillation with RVR and abdominal pain concerning for peritonitis, ESRD on peritoneal dialysis, COPD, morbid obesity, hypertension, anemia of chronic disease, type 2 diabetes mellitus, who was brought to the hospital because of abdominal pain and shortness of breath.  ? ? ? ? ? ? ? ?Assessment/Plan:  ? ?Principal Problem: ?  Abdominal pain ?Active Problems: ?  SOB (shortness of breath) ?  End stage renal disease ([place]) ?  Essential hypertension ?  Hyperglycemia due to type 2 diabetes mellitus ([place]) ?  Anemia ?  Acute respiratory failure with hypoxia ([place]) ? ? ?Body mass index is 40.74 kg/m?.  (Obesity) ? ?Abdominal pain, recent peritonitis: Improving.  Continue empiric IV antibiotics.  Follow-up blood cultures.  Analgesics as needed for pain.  ? ?Acute hypoxic respiratory failure: Improving.  She is down from 4 to 2 L/min oxygen via nasal cannula.  Taper off oxygen as able.   ? ?Altered mental status/acute metabolic encephalopathy: Improved ? ?ESRD with fluid overload: Continue IV Lasix.  Follow-up with nephrologist for peritoneal dialysis. ? ?COPD: Continue bronchodilators ? ?Brittle type II DM with hyperglycemia: Restart home Levemir at 12 units twice daily think she is more alert and she is eating.  Use NovoLog as needed for hyperglycemia. ? ?Paroxysmal atrial fibrillation: Resume Eliquis ? ?History of hypertension: BP stable. ? ? ? ?Diet Order   ? ?       ?  Diet renal/carb modified with fluid restriction Diet-HS Snack? Nothing; Fluid restriction: 1200 mL Fluid; Room service appropriate? Yes; Fluid consistency: Thin  Diet effective now       ?  ? ?  ?  ? ?   ? ? ? ? ? ?Consultants: ?Nephrologist ? ?Procedures: ?None ? ? ? ?Medications:  ? ? furosemide  80 mg Intravenous Daily  ? gentamicin cream  1 application. Topical Daily  ? insulin aspart  0-6 Units Subcutaneous TID WC  ? insulin detemir  12 Units Subcutaneous BID  ? mometasone-formoterol  2 puff Inhalation BID  ? vancomycin variable dose per unstable renal function (pharmacist dosing)   Does not apply See admin instructions  ? ?Continuous Infusions: ? cefTAZidime (FORTAZ)  IV    ? dialysis solution 2.5% low-MG/low-CA    ? ? ? ?Anti-infectives (From admission, onward)  ? ? Start     Dose/Rate Route Frequency Ordered Stop  ? 02/15/22 1415  cefTAZidime (FORTAZ) 1 g in sodium chloride 0.9 % 100 mL IVPB       ? 1 g ?200 mL/hr over 30 Minutes Intravenous Every 24 hours 02/15/22 1408    ? 02/15/22 1415  vancomycin (VANCOREADY) IVPB 2000 mg/400 mL       ? 2,000 mg ?200 mL/hr over 120 Minutes Intravenous  Once 02/15/22 1408 02/15/22 2025  ? 02/15/22 1406  vancomycin variable dose per unstable renal function (pharmacist dosing)       ?  Does not apply See admin instructions 02/15/22 1408    ? ?  ? ? ? ? ? ? ? ? ? ?Family Communication/Anticipated D/C date and plan/Code Status  ? ?DVT prophylaxis: SCDs Start: 02/15/22 0039 ? ? ?  Code Status:  Full Code ? ?Family Communication: Plan discussed with her husband at the bedside ?Disposition Plan: Plan to discharge home in 2 to 3 days ? ?Status is: Inpatient ?Remains inpatient appropriate because: On IV antibiotics ? ? ? ? ? ? ? ? ?Subjective:  ? ?Interval events noted.  She feels much better.  No shortness of breath or chest pain.  Abdominal pain is better.  She was working with PT when I walked in.  ? ? ?Objective:  ? ? ?Vitals:  ? 02/16/22 0500 02/16/22 0742 02/16/22 0814 02/16/22 1132  ?BP: (!) 114/55 (!) 122/55 138/85 (!) 140/58  ?Pulse: 75 67 (!) 57 78  ?Resp: 18  20 (!) 22  ?Temp: 98.1 ?F (36.7 ?C) 98.2 ?F (36.8 ?C) 97.6 ?F (36.4 ?C) 98.1 ?F (36.7 ?C)  ?TempSrc: Oral  Oral  Oral  ?SpO2: 98% 97% 96% 100%  ?Weight:      ?Height:      ? ?No data found. ? ? ?Intake/Output Summary (Last 24 hours) at 02/16/2022 1442 ?Last data filed at 02/16/2022 0400 ?Gross per 24 hour  ?Intake 100 ml  ?Output --  ?Net 100 ml  ? ?Filed Weights  ? 02/14/22 2126 02/15/22 1813 02/16/22 0300  ?Weight: 88.9 kg 90 kg 91.5 kg  ? ? ?Exam: ? ?GEN: NAD ?SKIN: No rash ?EYES: EOMI ?ENT: MMM ?CV: RRR ?PULM: CTA B ?ABD: soft, obese, NT, +BS ?CNS: AAO x 3, non focal ?EXT: Edema of bilateral upper and lower extremities slowly improving. ? ? ? ? ? ?  ? ? ?Data Reviewed:  ? ?I have personally reviewed following labs and imaging studies: ? ?Labs: ?Labs show the following:  ? ?Basic Metabolic Panel: ?Recent Labs  ?Lab 02/14/22 ?2132 02/15/22 ?1610 02/16/22 ?0401  ?NA 136 138 137  ?K 4.4 4.2 4.5  ?CL 100 100 101  ?CO2 29 31 27   ?GLUCOSE 501* 248* 263*  ?BUN 19 20 28*  ?CREATININE 3.78* 3.97* 5.24*  ?CALCIUM 8.6* 8.7* 8.4*  ? ?GFR ?Estimated Creatinine Clearance: 9 mL/min (A) (by C-G formula based on SCr of 5.24 mg/dL (H)). ?Liver Function Tests: ?Recent Labs  ?Lab 02/14/22 ?2132 02/15/22 ?9604  ?AST 31 26  ?ALT 22 19  ?ALKPHOS 83 77  ?BILITOT 0.6 0.6  ?PROT 6.1* 5.8*  ?ALBUMIN 2.0* 2.0*  ? ?No results for input(s): LIPASE, AMYLASE in the last 168 hours. ?No results for input(s): AMMONIA in the last 168 hours. ?Coagulation profile ?No results for input(s): INR, PROTIME in the last 168 hours. ? ?CBC: ?Recent Labs  ?Lab 02/14/22 ?2132 02/15/22 ?5409 02/15/22 ?1615 02/16/22 ?0401  ?WBC 7.6 13.7* 8.6 7.6  ?HGB 10.4* 9.7* 8.8* 8.7*  ?HCT 33.5* 31.1* 29.0* 28.7*  ?MCV 98.0 99.4 99.0 100.3*  ?PLT 305 291 262 240  ? ?Cardiac Enzymes: ?No results for input(s): CKTOTAL, CKMB, CKMBINDEX, TROPONINI in the last 168 hours. ?BNP (last 3 results) ?No results for input(s): PROBNP in the last 8760 hours. ?CBG: ?Recent Labs  ?Lab 02/15/22 ?1111 02/15/22 ?1832 02/15/22 ?1949 02/16/22 ?8119 02/16/22 ?1129  ?GLUCAP 247* 191* 212* 255* 316*   ? ?D-Dimer: ?No results for input(s): DDIMER in the last 72 hours. ?Hgb A1c: ?No results for input(s): HGBA1C in the last 72 hours. ?Lipid Profile: ?No results for input(s): CHOL, HDL, LDLCALC, TRIG, CHOLHDL, LDLDIRECT in the last 72 hours. ?Thyroid function studies: ?No results for input(s): TSH, T4TOTAL, T3FREE, THYROIDAB in the last 72 hours. ? ?Invalid input(s): FREET3 ?Anemia work up: ?No results for input(s): VITAMINB12, FOLATE, FERRITIN,  TIBC, IRON, RETICCTPCT in the last 72 hours. ?Sepsis Labs: ?Recent Labs  ?Lab 02/14/22 ?2132 02/15/22 ?3244 02/15/22 ?1615 02/16/22 ?0401  ?PROCALCITON 0.28  --   --   --   ?WBC 7.6 13.7* 8.6 7.6  ?LATICACIDVEN 2.2* 1.0  --   --   ? ? ?Microbiology ?Recent Results (from the past 240 hour(s))  ?Resp Panel by RT-PCR (Flu A&B, Covid) Nasopharyngeal Swab     Status: None  ? Collection Time: 02/14/22 10:07 PM  ? Specimen: Nasopharyngeal Swab; Nasopharyngeal(NP) swabs in vial transport medium  ?Result Value Ref Range Status  ? SARS Coronavirus 2 by RT PCR NEGATIVE NEGATIVE Final  ?  Comment: (NOTE) ?SARS-CoV-2 target nucleic acids are NOT DETECTED. ? ?The SARS-CoV-2 RNA is generally detectable in upper respiratory ?specimens during the acute phase of infection. The lowest ?concentration of SARS-CoV-2 viral copies this assay can detect is ?138 copies/mL. A negative result does not preclude SARS-Cov-2 ?infection and should not be used as the sole basis for treatment or ?other patient management decisions. A negative result may occur with  ?improper specimen collection/handling, submission of specimen other ?than nasopharyngeal swab, presence of viral mutation(s) within the ?areas targeted by this assay, and inadequate number of viral ?copies(<138 copies/mL). A negative result must be combined with ?clinical observations, patient history, and epidemiological ?information. The expected result is Negative. ? ?Fact Sheet for Patients:  ?EntrepreneurPulse.com.au ? ?Fact  Sheet for Healthcare Providers:  ?IncredibleEmployment.be ? ?This test is no t yet approved or cleared by the [place] FDA and  ?has been authorized for detection and/or diagnosis of SARS-CoV-2 by ?FDA unde

## 2022-02-16 NOTE — Progress Notes (Signed)
*  PRELIMINARY RESULTS* ?Echocardiogram ?2D Echocardiogram has been performed. ? ?Andrea Stewart ?02/16/2022, 8:56 AM ?

## 2022-02-16 NOTE — Evaluation (Signed)
Occupational Therapy Evaluation ?Patient Details ?Name: Andrea Stewart ?MRN: [id number] ?DOB: May 16, 1946 ?Today's Date: 02/16/2022 ? ? ?History of Present Illness Andrea Stewart is a 76 y.o. female with medical history significant for ESRD on peritoneal dialysis, COPD, morbid obesity, hypertension, anemia of chronic disease, type 2 diabetes mellitus, who was brought to the hospital because of abdominal pain and shortness of breath.  ? ?Clinical Impression ?  ?Andrea Stewart presents with generalized weakness, limited endurance, dyspnea, and fatigue. She reports she is now experiencing only mild or no abdominal pain. She lives at home with her husband, with 2 sons nearby. Home is one level, with ramped entrance, and grab bars available by toilet and shower. At baseline, husband assists pt with ADL, including bed mobility, transfers, dressing, bathing. During today's evaluation, pt requires Max A for LB dressing, Mod A for bed mobility, and tires very quickly with activity; she is able to stand 2-3 minutes with RW before her UE become shaky and she states she needs to return to sitting. After performing simple exercises in sitting for ~ 2-3 minutes, O2 sats drop into mid 80s. Despite pt's considerable care needs, she appears to be at or near her baseline level of fxl mobility. Pt and husband state unequivocally they are not interested in SNF-level care. Recommend ongoing OT while pt is hospitalized, with HHOT following DC. Given that pt was recently hospitalized, orders for [facility] and HHPT are already in place, according to pt and spouse. Pt already has all necessary DME available at home. ?   ? ?Recommendations for follow up therapy are one component of a multi-disciplinary discharge planning process, led by the attending physician.  Recommendations may be updated based on patient status, additional functional criteria and insurance authorization.  ? ?Follow Up Recommendations ? Home health OT  ?  ?Assistance Recommended at  Discharge Frequent or constant Supervision/Assistance  ?Patient can return home with the following A lot of help with walking and/or transfers;A lot of help with bathing/dressing/bathroom;Assistance with cooking/housework;Assist for transportation ? ?  ?Functional Status Assessment ? Patient has had a recent decline in their functional status and demonstrates the ability to make significant improvements in function in a reasonable and predictable amount of time.  ?Equipment Recommendations ? None recommended by OT  ?  ?Recommendations for Other Services   ? ? ?  ?Precautions / Restrictions Precautions ?Precautions: Fall ?Restrictions ?Weight Bearing Restrictions: No  ? ?  ? ?Mobility Bed Mobility ?Overal bed mobility: Needs Assistance ?Bed Mobility: Supine to Sit, Sit to Supine ?  ?  ?Supine to sit: Min assist ?Sit to supine: Mod assist ?  ?General bed mobility comments: Mod A for sit<supine, for elevating LE. Mod A for scooting towards HOB in supine. ?  ? ?Transfers ?Overall transfer level: Needs assistance ?Equipment used: Rolling walker (2 wheels) ?Transfers: Sit to/from Stand ?Sit to Stand: Min assist, From elevated surface ?  ?  ?  ?  ?  ?  ?  ? ?  ?Balance Overall balance assessment: Needs assistance ?Sitting-balance support: Feet supported, Bilateral upper extremity supported ?Sitting balance-Leahy Scale: Good ?  ?  ?Standing balance support: Bilateral upper extremity supported ?Standing balance-Leahy Scale: Fair ?Standing balance comment: Balance fair, however, pt able to maintain for 3 mins max before requesting to return to sitting ?  ?  ?  ?  ?  ?  ?  ?  ?  ?  ?  ?   ? ?ADL either performed or assessed with clinical judgement  ? ?  ADL Overall ADL's : Needs assistance/impaired ?Eating/Feeding: Minimal assistance ?Eating/Feeding Details (indicate cue type and reason): hands shaky, spilling ~ 10% of food. Required assistance for opening food containers. ?  ?  ?  ?  ?  ?  ?  ?  ?Lower Body Dressing: Maximal  assistance ?Lower Body Dressing Details (indicate cue type and reason): Max A for donning socks ?  ?  ?  ?  ?  ?  ?  ?   ? ? ? ?Vision Patient Visual Report: No change from baseline ?   ?   ?Perception   ?  ?Praxis   ?  ? ?Pertinent Vitals/Pain Pain Assessment ?Pain Assessment: No/denies pain  ? ? ? ?Hand Dominance   ?  ?Extremity/Trunk Assessment Upper Extremity Assessment ?Upper Extremity Assessment: Generalized weakness ?  ?Lower Extremity Assessment ?Lower Extremity Assessment: Generalized weakness ?  ?  ?  ?Communication Communication ?Communication: No difficulties ?  ?Cognition Arousal/Alertness: Awake/alert ?Behavior During Therapy: [facility] for tasks assessed/performed ?Overall Cognitive Status: Within Functional Limits for tasks assessed ?  ?  ?  ?  ?  ?  ?  ?  ?  ?  ?  ?  ?  ?  ?  ?  ?General Comments: Oriented to time, place, situation; however, has difficulty provided history (husband assists frequently) and following conversation. ?  ?  ?General Comments    ? ?  ?Exercises Other Exercises ?Other Exercises: Educ re: DC recs; limited UB and LB therex in sitting ?  ?Shoulder Instructions    ? ? ?Home Living Family/patient expects to be discharged to:: Private residence ?Living Arrangements: Spouse/significant other ?Available Help at Discharge: Family;Available 24 hours/day;Available PRN/intermittently (husband available 24/7; 2 sons live in area and are available PRN) ?Type of Home: House ?Home Access: Ramped entrance ?  ?  ?Home Layout: One level ?  ?  ?Bathroom Shower/Tub: Walk-in shower ?  ?Bathroom Toilet: Standard ?  ?  ?Home Equipment: Conservation officer, nature (2 wheels);Transport chair;Grab bars - tub/shower;Grab bars - toilet;Cane - single point;Other (comment) (4-wheeled walker; lift chair) ?  ?  ?  ? ?  ?Prior Functioning/Environment Prior Level of Function : Needs assist ?  ?  ?  ?  ?  ?  ?Mobility Comments: occassionally uses 4-wheeled walker for ambulation; primarily uses transport WC, which husband  pushes ?ADLs Comments: husband assists with bed mobility,transfers, UB and LB dressing; bathing. Husband manages all IADL. ?  ? ?  ?  ?OT Problem List: Decreased activity tolerance;Obesity;Decreased strength;Impaired balance (sitting and/or standing) ?  ?   ?OT Treatment/Interventions: Self-care/ADL training;Therapeutic exercise;Patient/family education;Balance training;Energy conservation;Therapeutic activities;DME and/or AE instruction  ?  ?OT Goals(Current goals can be found in the care plan section) Acute Rehab OT Goals ?Patient Stated Goal: to go home ?OT Goal Formulation: With patient/family ?Time For Goal Achievement: 03/02/22 ?Potential to Achieve Goals: Good ?ADL Goals ?Pt Will Perform Grooming: with set-up;with supervision;sitting ?Pt Will Perform Upper Body Dressing: with set-up;with supervision;sitting ?Pt Will Transfer to Toilet: bedside commode;with min assist (using LRAD)  ?OT Frequency: Min 2X/week ?  ? ?Co-evaluation   ?  ?  ?  ?  ? ?  ?AM-PAC OT "6 Clicks" Daily Activity     ?Outcome Measure Help from another person eating meals?: A Little ?Help from another person taking care of personal grooming?: A Little ?Help from another person toileting, which includes using toliet, bedpan, or urinal?: A Lot ?Help from another person bathing (including washing, rinsing, drying)?: A Lot ?Help from  another person to put on and taking off regular upper body clothing?: A Little ?Help from another person to put on and taking off regular lower body clothing?: A Lot ?6 Click Score: 15 ?  ?End of Session Equipment Utilized During Treatment: Rolling walker (2 wheels) ? ?Activity Tolerance: Patient limited by fatigue ?Patient left: in bed;with call bell/phone within reach;Other (comment);with family/visitor present (with PT in room) ? ?OT Visit Diagnosis: Unsteadiness on feet (R26.81);Muscle weakness (generalized) (M62.81)  ?              ?Time: 3338-3291 ?OT Time Calculation (min): 35 min ?Charges:  OT General  Charges ?$OT Visit: 1 Visit ?OT Evaluation ?$OT Eval Moderate Complexity: 1 Mod ?OT Treatments ?$Self Care/Home Management : 23-37 mins ?Josiah Lobo, PhD, MS, OTR/L ?02/16/22, 11:30 AM ? ?

## 2022-02-16 NOTE — Progress Notes (Signed)
?[facility]  ?ROUNDING NOTE  ? ?Subjective:  ? ?Andrea Stewart is a 76 year old female with a past medical concerns including COPD, hypertension, depression, diabetes and end stage renal disease. Patient presents to ED with complaints vomiting, diarrhea and abdominal pain. She has been admitted for Shortness of breath [R06.02] ?Generalized abdominal pain [R10.84] ?Acute respiratory failure with hypoxia ([place]) [J96.01] ?Abdominal pain [R10.9] ?Hypervolemia, unspecified hypervolemia type [E87.70] ? ?She is known to our practice and receives outpatient follow-up with Dr. Juleen China.  Patient was recently admitted and discharged yesterday due to abdominal pain, increased swelling and shortness of breath.  Patient was suspected to have peritonitis and was discharged with an outpatient plan to follow-up with outpatient dialysis clinic to receive antibiotics with peritoneal dialysis treatments. ? ?Patient seen laying in bed ?Husband at bedside ?Currently receiving ECHO ?States abdominal pain has improved ?Remains edematous ? ?Objective:  ?Vital signs in last 24 hours:  ?Temp:  [97.6 ?F (36.4 ?C)-98.6 ?F (37 ?C)] 98.1 ?F (36.7 ?C) (04/04 1132) ?Pulse Rate:  [41-100] 78 (04/04 1132) ?Resp:  [13-26] 22 (04/04 1132) ?BP: (107-140)/(47-85) 140/58 (04/04 1132) ?SpO2:  [90 %-100 %] 100 % (04/04 1132) ?Weight:  [90 kg-91.5 kg] 91.5 kg (04/04 0300) ? ?Weight change: 1.1 kg ?Filed Weights  ? 02/14/22 2126 02/15/22 1813 02/16/22 0300  ?Weight: 88.9 kg 90 kg 91.5 kg  ? ? ?Intake/Output: ?I/O last 3 completed shifts: ?In: 100 [P.O.:100] ?Out: -  ?  ?Intake/Output this shift: ? No intake/output data recorded. ? ?Physical Exam: ?General: ill appearing  ?Head: Normocephalic, atraumatic. Moist oral mucosal membranes  ?Eyes: Anicteric  ?Lungs:  Clear to auscultation, normal effort  ?Heart: Regular rate and rhythm  ?Abdomen:  Soft, nontender, PD catheter  ?Extremities:  1+BLE, 2+ BUE peripheral edema.  ?Neurologic: Alert, moving  all four extremities  ?Skin: No lesions  ?Access: PD catheter  ? ? ?Basic Metabolic Panel: ?Recent Labs  ?Lab 02/14/22 ?2132 02/15/22 ?6301 02/16/22 ?0401  ?NA 136 138 137  ?K 4.4 4.2 4.5  ?CL 100 100 101  ?CO2 29 31 27   ?GLUCOSE 501* 248* 263*  ?BUN 19 20 28*  ?CREATININE 3.78* 3.97* 5.24*  ?CALCIUM 8.6* 8.7* 8.4*  ? ? ? ?Liver Function Tests: ?Recent Labs  ?Lab 02/14/22 ?2132 02/15/22 ?6010  ?AST 31 26  ?ALT 22 19  ?ALKPHOS 83 77  ?BILITOT 0.6 0.6  ?PROT 6.1* 5.8*  ?ALBUMIN 2.0* 2.0*  ? ? ?No results for input(s): LIPASE, AMYLASE in the last 168 hours. ?No results for input(s): AMMONIA in the last 168 hours. ? ?CBC: ?Recent Labs  ?Lab 02/14/22 ?2132 02/15/22 ?9323 02/15/22 ?1615 02/16/22 ?0401  ?WBC 7.6 13.7* 8.6 7.6  ?HGB 10.4* 9.7* 8.8* 8.7*  ?HCT 33.5* 31.1* 29.0* 28.7*  ?MCV 98.0 99.4 99.0 100.3*  ?PLT 305 291 262 240  ? ? ? ?Cardiac Enzymes: ?No results for input(s): CKTOTAL, CKMB, CKMBINDEX, TROPONINI in the last 168 hours. ? ?BNP: ?Invalid input(s): POCBNP ? ?CBG: ?Recent Labs  ?Lab 02/15/22 ?1111 02/15/22 ?1832 02/15/22 ?1949 02/16/22 ?5573 02/16/22 ?1129  ?GLUCAP 247* 191* 212* 255* 316*  ? ? ? ?Microbiology: ?Results for orders placed or performed during the hospital encounter of 02/14/22  ?Resp Panel by RT-PCR (Flu A&B, Covid) Nasopharyngeal Swab     Status: None  ? Collection Time: 02/14/22 10:07 PM  ? Specimen: Nasopharyngeal Swab; Nasopharyngeal(NP) swabs in vial transport medium  ?Result Value Ref Range Status  ? SARS Coronavirus 2 by RT PCR NEGATIVE NEGATIVE Final  ?  Comment: (NOTE) ?SARS-CoV-2 target nucleic acids are NOT DETECTED. ? ?The SARS-CoV-2 RNA is generally detectable in upper respiratory ?specimens during the acute phase of infection. The lowest ?concentration of SARS-CoV-2 viral copies this assay can detect is ?138 copies/mL. A negative result does not preclude SARS-Cov-2 ?infection and should not be used as the sole basis for treatment or ?other patient management decisions. A  negative result may occur with  ?improper specimen collection/handling, submission of specimen other ?than nasopharyngeal swab, presence of viral mutation(s) within the ?areas targeted by this assay, and inadequate number of viral ?copies(<138 copies/mL). A negative result must be combined with ?clinical observations, patient history, and epidemiological ?information. The expected result is Negative. ? ?Fact Sheet for Patients:  ?EntrepreneurPulse.com.au ? ?Fact Sheet for Healthcare Providers:  ?IncredibleEmployment.be ? ?This test is no t yet approved or cleared by the [place] FDA and  ?has been authorized for detection and/or diagnosis of SARS-CoV-2 by ?FDA under an Emergency Use Authorization (EUA). This EUA will remain  ?in effect (meaning this test can be used) for the duration of the ?COVID-19 declaration under Section 564(b)(1) of the Act, 21 ?U.S.C.section 360bbb-3(b)(1), unless the authorization is terminated  ?or revoked sooner.  ? ? ?  ? Influenza A by PCR NEGATIVE NEGATIVE Final  ? Influenza B by PCR NEGATIVE NEGATIVE Final  ?  Comment: (NOTE) ?The Xpert Xpress SARS-CoV-2/FLU/RSV plus assay is intended as an aid ?in the diagnosis of influenza from Nasopharyngeal swab specimens and ?should not be used as a sole basis for treatment. Nasal washings and ?aspirates are unacceptable for Xpert Xpress SARS-CoV-2/FLU/RSV ?testing. ? ?Fact Sheet for Patients: ?EntrepreneurPulse.com.au ? ?Fact Sheet for Healthcare Providers: ?IncredibleEmployment.be ? ?This test is not yet approved or cleared by the [place] FDA and ?has been authorized for detection and/or diagnosis of SARS-CoV-2 by ?FDA under an Emergency Use Authorization (EUA). This EUA will remain ?in effect (meaning this test can be used) for the duration of the ?COVID-19 declaration under Section 564(b)(1) of the Act, 21 U.S.C. ?section 360bbb-3(b)(1), unless the authorization  is terminated or ?revoked. ? ?Performed at [facility], [place], ?[name] [address] ?  ?CULTURE, BLOOD (ROUTINE X 2) w Reflex to ID Panel     Status: None (Preliminary result)  ? Collection Time: 02/15/22  4:14 PM  ? Specimen: BLOOD  ?Result Value Ref Range Status  ? Specimen Description BLOOD RW  Final  ? Special Requests BOTTLES DRAWN AEROBIC AND ANAEROBIC BCAV  Final  ? Culture   Final  ?  NO GROWTH < 24 HOURS ?Performed at [facility], [address] [place], [place] [address] ?  ? Report Status PENDING  Incomplete  ?CULTURE, BLOOD (ROUTINE X 2) w Reflex to ID Panel     Status: None (Preliminary result)  ? Collection Time: 02/15/22  4:15 PM  ? Specimen: BLOOD  ?Result Value Ref Range Status  ? Specimen Description BLOOD [facility]  Final  ? Special Requests BOTTLES DRAWN AEROBIC AND ANAEROBIC BCAV  Final  ? Culture   Final  ?  NO GROWTH < 24 HOURS ?Performed at [facility], [address] [place], [place] [address] ?  ? Report Status PENDING  Incomplete  ? ? ?Coagulation Studies: ?No results for input(s): LABPROT, INR in the last 72 hours. ? ?Urinalysis: ?No results for input(s): COLORURINE, LABSPEC, [place], GLUCOSEU, HGBUR, BILIRUBINUR, KETONESUR, PROTEINUR, UROBILINOGEN, NITRITE, LEUKOCYTESUR in the last 72 hours. ? ?Invalid input(s): APPERANCEUR  ? ? ?Imaging: ?CT  ABDOMEN PELVIS WO CONTRAST ? ?Result Date: 02/15/2022 ?CLINICAL DATA:  Abdominal pain, acute, nonlocalized. End-stage renal disease, peritoneal dialysis. Shortness of breath. EXAM: CT ABDOMEN AND PELVIS WITHOUT CONTRAST TECHNIQUE: Multidetector CT imaging of the abdomen and pelvis was performed following the standard protocol without IV contrast. RADIATION DOSE REDUCTION: This exam was performed according to the departmental dose-optimization program which includes automated exposure control, adjustment of the mA and/or kV according to patient size and/or use of iterative reconstruction technique.  COMPARISON:  02/09/2022 FINDINGS: Lower chest: Small bilateral pleural effusions, similar to prior study. Dependent compressive atelectasis in the lower lobes. Heart is normal size. Aortic and coronary artery calcifica

## 2022-02-16 NOTE — Progress Notes (Signed)
Inpatient Diabetes Program Recommendations ? ?AACE/ADA: New Consensus Statement on Inpatient Glycemic Control (2015) ? ?Target Ranges:  Prepandial:   less than 140 mg/dL ?     Peak postprandial:   less than 180 mg/dL (1-2 hours) ?     Critically ill patients:  140 - 180 mg/dL  ? ? Latest Reference Range & Units 02/15/22 01:06 02/15/22 11:11 02/15/22 18:32 02/15/22 19:49 02/16/22 05:12  ?Glucose-Capillary 70 - 99 mg/dL 249 (H) 247 (H) 191 (H) 212 (H) 255 (H)  ? ?History: DM, ESRD  ? ?Home DM Meds: NPH 22 units AM/ 28 units PM  ?  Humalog 3-5 units TID  ? ? ?Current Orders: Novolog 0-6 units TID  ? ? ? ?MD- Pt may likely need a portion of her home long-acting insulin back  ? ?Please consider starting Levemir 8 units ? ?--Will follow patient during hospitalization-- ? ?Tama Headings RN, MSN, BC-ADM ?Inpatient Diabetes Coordinator ?Team Pager [phone] (8a-5p) ?

## 2022-02-17 ENCOUNTER — Inpatient Hospital Stay: Payer: Self-pay

## 2022-02-17 LAB — BASIC METABOLIC PANEL
Anion gap: 8 (ref 5–15)
BUN: 28 mg/dL — ABNORMAL HIGH (ref 8–23)
CO2: 27 mmol/L (ref 22–32)
Calcium: 8.2 mg/dL — ABNORMAL LOW (ref 8.9–10.3)
Chloride: 99 mmol/L (ref 98–111)
Creatinine, Ser: 4.91 mg/dL — ABNORMAL HIGH (ref 0.44–1.00)
GFR, Estimated: 9 mL/min — ABNORMAL LOW (ref >60)
Glucose, Bld: 537 mg/dL (ref 70–99)
Potassium: 3.7 mmol/L (ref 3.5–5.1)
Sodium: 134 mmol/L — ABNORMAL LOW (ref 135–145)

## 2022-02-17 LAB — CBC WITH DIFFERENTIAL/PLATELET
Abs Immature Granulocytes: 0.06 10*3/uL (ref 0.00–0.07)
Basophils Absolute: 0 10*3/uL (ref 0.0–0.1)
Basophils Relative: 1 %
Eosinophils Absolute: 0.9 10*3/uL — ABNORMAL HIGH (ref 0.0–0.5)
Eosinophils Relative: 15 %
HCT: 27.6 % — ABNORMAL LOW (ref 36.0–46.0)
Hemoglobin: 8.5 g/dL — ABNORMAL LOW (ref 12.0–15.0)
Immature Granulocytes: 1 %
Lymphocytes Relative: 22 %
Lymphs Abs: 1.3 10*3/uL (ref 0.7–4.0)
MCH: 29.9 pg (ref 26.0–34.0)
MCHC: 30.8 g/dL (ref 30.0–36.0)
MCV: 97.2 fL (ref 80.0–100.0)
Monocytes Absolute: 0.4 10*3/uL (ref 0.1–1.0)
Monocytes Relative: 7 %
Neutro Abs: 3.4 10*3/uL (ref 1.7–7.7)
Neutrophils Relative %: 54 %
Platelets: 260 10*3/uL (ref 150–400)
RBC: 2.84 MIL/uL — ABNORMAL LOW (ref 3.87–5.11)
RDW: 13.9 % (ref 11.5–15.5)
WBC: 6.1 10*3/uL (ref 4.0–10.5)
nRBC: 0 % (ref 0.0–0.2)

## 2022-02-17 LAB — GLUCOSE, CAPILLARY
Glucose-Capillary: 384 mg/dL — ABNORMAL HIGH (ref 70–99)
Glucose-Capillary: 453 mg/dL — ABNORMAL HIGH (ref 70–99)
Glucose-Capillary: 444 mg/dL — ABNORMAL HIGH (ref 70–99)
Glucose-Capillary: 217 mg/dL — ABNORMAL HIGH (ref 70–99)
Glucose-Capillary: 292 mg/dL — ABNORMAL HIGH (ref 70–99)

## 2022-02-17 LAB — PATHOLOGIST SMEAR REVIEW

## 2022-02-17 LAB — MRSA NEXT GEN BY PCR, NASAL: MRSA by PCR Next Gen: NOT DETECTED

## 2022-02-17 MED ORDER — DELFLEX-LC/1.5% DEXTROSE 344 MOSM/L IP SOLN: INTRAPERITONEAL | Status: DC

## 2022-02-17 MED ORDER — DELFLEX-LC/4.25% DEXTROSE 483 MOSM/L IP SOLN
INTRAPERITONEAL | Status: DC
  Filled 2022-02-17 (×5): qty 3000

## 2022-02-17 MED ORDER — INSULIN ASPART 100 UNIT/ML IJ SOLN
2.0000 [IU] | Freq: Three times a day (TID) | INTRAMUSCULAR | Status: DC
  Administered 2022-02-17 – 2022-02-19 (×5): 2 [IU] via SUBCUTANEOUS
  Filled 2022-02-17 (×6): qty 1

## 2022-02-17 MED ORDER — INSULIN ASPART 100 UNIT/ML IJ SOLN
0.0000 [IU] | Freq: Three times a day (TID) | INTRAMUSCULAR | Status: DC
  Administered 2022-02-17: 3 [IU] via SUBCUTANEOUS
  Filled 2022-02-17: qty 1

## 2022-02-17 MED ORDER — INSULIN ASPART 100 UNIT/ML IJ SOLN
10.0000 [IU] | Freq: Once | INTRAMUSCULAR | Status: AC
Start: 2022-02-17 — End: 2022-02-17
  Administered 2022-02-17: 10 [IU] via SUBCUTANEOUS
  Filled 2022-02-17: qty 1

## 2022-02-17 MED ORDER — INSULIN DETEMIR 100 UNIT/ML ~~LOC~~ SOLN
18.0000 [IU] | Freq: Two times a day (BID) | SUBCUTANEOUS | Status: DC
  Administered 2022-02-17: 18 [IU] via SUBCUTANEOUS
  Filled 2022-02-17 (×2): qty 0.18

## 2022-02-17 MED ORDER — INSULIN ASPART 100 UNIT/ML IJ SOLN: 2.0000 [IU] | Freq: Three times a day (TID) | INTRAMUSCULAR | Status: DC

## 2022-02-17 MED ORDER — INSULIN ASPART 100 UNIT/ML IJ SOLN: 0.0000 [IU] | Freq: Three times a day (TID) | INTRAMUSCULAR | Status: DC

## 2022-02-17 NOTE — Progress Notes (Signed)
Per secure chat with Colon Flattery NP/Nephrologist, "We would suggest holding on the PICC line for now. Working with pharmacy to give antibiotics with PD." We will discontinue PICC order and put back if needed. ?

## 2022-02-17 NOTE — Hospital Course (Addendum)
Andrea Stewart is a 76 y.o. female with medical history significant for recent discharge from the hospital on 02/14/2022 after hospitalization for atrial fibrillation with RVR and abdominal pain concerning for peritonitis, ESRD on peritoneal dialysis, COPD, morbid obesity, hypertension, anemia of chronic disease, type 2 diabetes mellitus, who was brought to the hospital because of abdominal pain and shortness of breath.  She initially required 4 L/min supplemental oxygen. ? ?Admitted to the hospital.  Started on empiric antibiotics pending peritoneal fluid cultures.  Nephrology consulted and recommending consideration to transition from PD to hemodialysis but patient declines to do so.  Also being treated with IV Lasix for ongoing edema and dyspnea. ? ?Patient does not want to pursue hemodialysis, if PD were to stop working well enough to remove adequate fluid.   ?Palliative Care consulted.  ? ? ?

## 2022-02-17 NOTE — Progress Notes (Signed)
?[facility]  ?ROUNDING NOTE  ? ?Subjective:  ? ?Aletha Allebach is a 76 year old female with a past medical concerns including COPD, hypertension, depression, diabetes and end stage renal disease. Patient presents to ED with complaints vomiting, diarrhea and abdominal pain. She has been admitted for Shortness of breath [R06.02] ?Generalized abdominal pain [R10.84] ?Acute respiratory failure with hypoxia ([place]) [J96.01] ?Abdominal pain [R10.9] ?Hypervolemia, unspecified hypervolemia type [E87.70] ? ?She is known to our practice and receives outpatient follow-up with Dr. Juleen China.  Patient was recently admitted and discharged yesterday due to abdominal pain, increased swelling and shortness of breath.  Patient was suspected to have peritonitis and was discharged with an outpatient plan to follow-up with outpatient dialysis clinic to receive antibiotics with peritoneal dialysis treatments. ? ?Patient seen sitting at side of bed, alert and oriented ?Husband at bedside ?Remains on room air ?Tolerating small meals ?Denies pain or discomfort in abdomen during peritoneal dialysis overnight ?Generalized edema remains ? ?Objective:  ?Vital signs in last 24 hours:  ?Temp:  [97.9 ?F (36.6 ?C)-98.4 ?F (36.9 ?C)] 97.9 ?F (36.6 ?C) (04/05 4270) ?Pulse Rate:  [66-87] 66 (04/05 0343) ?Resp:  [17-22] 18 (04/05 0343) ?BP: (112-148)/(47-69) 148/69 (04/05 0803) ?SpO2:  [88 %-100 %] 94 % (04/05 0803) ?Weight:  [88.1 kg-92.7 kg] 88.1 kg (04/05 0803) ? ?Weight change: 2.7 kg ?Filed Weights  ? 02/16/22 1958 02/17/22 0624 02/17/22 0803  ?Weight: 92.7 kg 88.1 kg 88.1 kg  ? ? ?Intake/Output: ?I/O last 3 completed shifts: ?In: 200 [P.O.:100; IV Piggyback:100] ?Out: -  ?  ?Intake/Output this shift: ? Total I/O ?In: 10000 [Other:10000] ?Out: 62376 [EGBTD:17616] ? ?Physical Exam: ?General: NAD, sitting at bedside  ?Head: Normocephalic, atraumatic. Moist oral mucosal membranes  ?Eyes: Anicteric  ?Lungs:  Clear to auscultation, normal  effort  ?Heart: Regular rate and rhythm  ?Abdomen:  Soft, nontender, PD catheter  ?Extremities:  1+BLE, 2+ BUE peripheral edema.  ?Neurologic: Alert, moving all four extremities  ?Skin: No lesions  ?Access: PD catheter  ? ? ?Basic Metabolic Panel: ?Recent Labs  ?Lab 02/14/22 ?2132 02/15/22 ?0737 02/16/22 ?0401 02/17/22 ?1062  ?NA 136 138 137 134*  ?K 4.4 4.2 4.5 3.7  ?CL 100 100 101 99  ?CO2 29 31 27 27   ?GLUCOSE 501* 248* 263* 537*  ?BUN 19 20 28* 28*  ?CREATININE 3.78* 3.97* 5.24* 4.91*  ?CALCIUM 8.6* 8.7* 8.4* 8.2*  ? ? ? ?Liver Function Tests: ?Recent Labs  ?Lab 02/14/22 ?2132 02/15/22 ?6948  ?AST 31 26  ?ALT 22 19  ?ALKPHOS 83 77  ?BILITOT 0.6 0.6  ?PROT 6.1* 5.8*  ?ALBUMIN 2.0* 2.0*  ? ? ?No results for input(s): LIPASE, AMYLASE in the last 168 hours. ?No results for input(s): AMMONIA in the last 168 hours. ? ?CBC: ?Recent Labs  ?Lab 02/14/22 ?2132 02/15/22 ?5462 02/15/22 ?1615 02/16/22 ?0401 02/17/22 ?7035  ?WBC 7.6 13.7* 8.6 7.6 6.1  ?NEUTROABS  --   --   --   --  3.4  ?HGB 10.4* 9.7* 8.8* 8.7* 8.5*  ?HCT 33.5* 31.1* 29.0* 28.7* 27.6*  ?MCV 98.0 99.4 99.0 100.3* 97.2  ?PLT 305 291 262 240 260  ? ? ? ?Cardiac Enzymes: ?No results for input(s): CKTOTAL, CKMB, CKMBINDEX, TROPONINI in the last 168 hours. ? ?BNP: ?Invalid input(s): POCBNP ? ?CBG: ?Recent Labs  ?Lab 02/16/22 ?1129 02/16/22 ?1548 02/16/22 ?1958 02/17/22 ?0111 02/17/22 ?0093  ?GLUCAP 316* 354* 271* 384* 453*  ? ? ? ?Microbiology: ?Results for orders placed or performed during the hospital encounter  of 02/14/22  ?Resp Panel by RT-PCR (Flu A&B, Covid) Nasopharyngeal Swab     Status: None  ? Collection Time: 02/14/22 10:07 PM  ? Specimen: Nasopharyngeal Swab; Nasopharyngeal(NP) swabs in vial transport medium  ?Result Value Ref Range Status  ? SARS Coronavirus 2 by RT PCR NEGATIVE NEGATIVE Final  ?  Comment: (NOTE) ?SARS-CoV-2 target nucleic acids are NOT DETECTED. ? ?The SARS-CoV-2 RNA is generally detectable in upper respiratory ?specimens during the  acute phase of infection. The lowest ?concentration of SARS-CoV-2 viral copies this assay can detect is ?138 copies/mL. A negative result does not preclude SARS-Cov-2 ?infection and should not be used as the sole basis for treatment or ?other patient management decisions. A negative result may occur with  ?improper specimen collection/handling, submission of specimen other ?than nasopharyngeal swab, presence of viral mutation(s) within the ?areas targeted by this assay, and inadequate number of viral ?copies(<138 copies/mL). A negative result must be combined with ?clinical observations, patient history, and epidemiological ?information. The expected result is Negative. ? ?Fact Sheet for Patients:  ?EntrepreneurPulse.com.au ? ?Fact Sheet for Healthcare Providers:  ?IncredibleEmployment.be ? ?This test is no t yet approved or cleared by the [place] FDA and  ?has been authorized for detection and/or diagnosis of SARS-CoV-2 by ?FDA under an Emergency Use Authorization (EUA). This EUA will remain  ?in effect (meaning this test can be used) for the duration of the ?COVID-19 declaration under Section 564(b)(1) of the Act, 21 ?U.S.C.section 360bbb-3(b)(1), unless the authorization is terminated  ?or revoked sooner.  ? ? ?  ? Influenza A by PCR NEGATIVE NEGATIVE Final  ? Influenza B by PCR NEGATIVE NEGATIVE Final  ?  Comment: (NOTE) ?The Xpert Xpress SARS-CoV-2/FLU/RSV plus assay is intended as an aid ?in the diagnosis of influenza from Nasopharyngeal swab specimens and ?should not be used as a sole basis for treatment. Nasal washings and ?aspirates are unacceptable for Xpert Xpress SARS-CoV-2/FLU/RSV ?testing. ? ?Fact Sheet for Patients: ?EntrepreneurPulse.com.au ? ?Fact Sheet for Healthcare Providers: ?IncredibleEmployment.be ? ?This test is not yet approved or cleared by the [place] FDA and ?has been authorized for detection and/or  diagnosis of SARS-CoV-2 by ?FDA under an Emergency Use Authorization (EUA). This EUA will remain ?in effect (meaning this test can be used) for the duration of the ?COVID-19 declaration under Section 564(b)(1) of the Act, 21 U.S.C. ?section 360bbb-3(b)(1), unless the authorization is terminated or ?revoked. ? ?Performed at [facility], [place], ?[name] [address] ?  ?CULTURE, BLOOD (ROUTINE X 2) w Reflex to ID Panel     Status: None (Preliminary result)  ? Collection Time: 02/15/22  4:14 PM  ? Specimen: BLOOD  ?Result Value Ref Range Status  ? Specimen Description BLOOD RW  Final  ? Special Requests BOTTLES DRAWN AEROBIC AND ANAEROBIC BCAV  Final  ? Culture   Final  ?  NO GROWTH 2 DAYS ?Performed at [facility], [address], [place], [place] [address] ?  ? Report Status PENDING  Incomplete  ?CULTURE, BLOOD (ROUTINE X 2) w Reflex to ID Panel     Status: None (Preliminary result)  ? Collection Time: 02/15/22  4:15 PM  ? Specimen: BLOOD  ?Result Value Ref Range Status  ? Specimen Description BLOOD [facility]  Final  ? Special Requests BOTTLES DRAWN AEROBIC AND ANAEROBIC BCAV  Final  ? Culture   Final  ?  NO GROWTH 2 DAYS ?Performed at [facility], [address] [place],  [address] ?  ?  Report Status PENDING  Incomplete  ?Body fluid culture w Gram Stain     Status: None (Preliminary result)  ? Collection Time: 02/16/22  8:25 PM  ? Specimen: Peritoneal Washings; Body Fluid  ?Result Value Ref Range Status  ? Specimen Description   Final  ?  PERITONEAL ?Performed at [facility], [address] [place], [place] [address] ?  ? Special Requests   Final  ?  NONE ?Performed at [facility], [address], [place], [place] [address] ?  ? Gram Stain   Final  ?  FEW WBC PRESENT, PREDOMINANTLY PMN ?MODERATE GRAM POSITIVE COCCI IN CHAINS ?  ? Culture   Final  ?  NO GROWTH < 12 HOURS ?Performed at [facility], [place] [address], [place], [place]  [address] ?  ? Report Status PENDING  Incomplete  ? ? ?Coagulation Studies: ?No results for input(s): LABPROT, INR in the last 72 hours. ? ?Urinalysis: ?No results for input(s): COLORURINE, LABSPEC, [place], [place],

## 2022-02-17 NOTE — Assessment & Plan Note (Signed)
Most likely due to volume overload in the setting of ESRD.  Nephrology following for dialysis.  Also on IV Lasix.  Initial O2 requirement 4 L/min, this morning 2 L/min. ?-- Supplement O2 to maintain sats above 90%, wean as tolerated ?-- Other management as outlined ?

## 2022-02-17 NOTE — Consult Note (Signed)
Pharmacy Antibiotic Note ? ?Andrea Stewart is a 76 y.o. female w/  h/o ESRD-PD, COPD, morbid obesity to ED c/o SOB and CC of abd'l pain admitted on 02/14/2022 with  intra-abdominal infection  (suspected peritonitis ).  Pharmacy has been consulted for Vancomycin & ceftazidime dosing. ? ?Plan: ?Ceftazidime 1g q24h ?Vancomycin loading dose of 2g x1 now; then: ?F/u vancomycin random level at 3-5 days to determine appropriate timing of redosing. ?Plan for maintenance w/ 15-20 mg/kg doses --> Vanc 1500mg  to maintain >39mcg/mL. ? ?-- reordered MRSA PCR (order from 4/3 not collected) ?-- Blood cx NG x 2 days ?-- Vanc Random level: 4/06 @1000  ? ? ?Height: 4\' 11"  (149.9 cm) ?Weight: 88.1 kg (194 lb 4.8 oz) ?IBW/kg (Calculated) : 43.2 ? ?Temp (24hrs), Avg:98.2 ?F (36.8 ?C), Min:97.9 ?F (36.6 ?C), Max:98.4 ?F (36.9 ?C) ? ?Recent Labs  ?Lab 02/14/22 ?2132 02/15/22 ?2620 02/15/22 ?1615 02/16/22 ?0401 02/17/22 ?3559  ?WBC 7.6 13.7* 8.6 7.6 6.1  ?CREATININE 3.78* 3.97*  --  5.24* 4.91*  ?LATICACIDVEN 2.2* 1.0  --   --   --   ? ?  ?Estimated Creatinine Clearance: 9.4 mL/min (A) (by C-G formula based on SCr of 4.91 mg/dL (H)).   ? ?No Known Allergies ? ?Antimicrobials this admission: ?Vancomycin / Ceftazidime (4/03 >>  ? ?Dose adjustments this admission: ?ESRD-PD -- CTM based on levels at 3-5d for Vanco dosing. ? ?Microbiology results: ?4/03 BCx: NG x 2 days ?4/03 Flu/Cov: negative  ?4/03 MRSA PCR: ordered ? ?Thank you for allowing pharmacy to be a part of this patient?s care. ? ?Miraya Cudney Rodriguez-Guzman PharmD, BCPS ?02/17/2022 8:19 AM ? ? ? ?

## 2022-02-17 NOTE — Progress Notes (Signed)
Inpatient Diabetes Program Recommendations ? ?AACE/ADA: New Consensus Statement on Inpatient Glycemic Control (2015) ? ?Target Ranges:  Prepandial:   less than 140 mg/dL ?     Peak postprandial:   less than 180 mg/dL (1-2 hours) ?     Critically ill patients:  140 - 180 mg/dL  ? ? Latest Reference Range & Units 02/16/22 05:12 02/16/22 11:29 02/16/22 15:48 02/16/22 19:58 02/17/22 01:11 02/17/22 06:13  ?Glucose-Capillary 70 - 99 mg/dL 255 (H) 316 (H) 354 (H) 271 (H) 384 (H) 453 (H)  ? ?History: DM, ESRD  ? ?Home DM Meds: NPH 22 units AM/ 28 units PM  ?  Humalog 3-5 units TID  ? ? ?Current Orders: Novolog 0-6 units TID  ?  Levemir 12 units bid ? ? ? ?-  Increase Levemir to 18 units bid ?-  Increase Novolog to 0-9 units tid + hs scale ?-  Add Novolog 2 units tid meal coverage ? ?--Will follow patient during hospitalization-- ? ?Tama Headings RN, MSN, BC-ADM ?Inpatient Diabetes Coordinator ?Team Pager [phone] (8a-5p) ?

## 2022-02-17 NOTE — Progress Notes (Signed)
?Progress Note ? ? ?Patient: Andrea Stewart ELF:[id number] DOB: Oct 18, 1946 DOA: 02/14/2022     2 ?DOS: the patient was seen and examined on 02/17/2022 ?  ?Brief hospital course: ?Tanisha Lutes is a 76 y.o. female with medical history significant for recent discharge from the hospital on 02/14/2022 after hospitalization for atrial fibrillation with RVR and abdominal pain concerning for peritonitis, ESRD on peritoneal dialysis, COPD, morbid obesity, hypertension, anemia of chronic disease, type 2 diabetes mellitus, who was brought to the hospital because of abdominal pain and shortness of breath.  She initially required 4 L/min supplemental oxygen. ? ?Admitted to the hospital.  Started on empiric antibiotics pending peritoneal fluid cultures.  Nephrology consulted and recommending consideration to transition from PD to hemodialysis but patient declines to do so.  Also being treated with IV Lasix for ongoing edema and dyspnea. ? ?Noted to have significant hyperglycemia with glucose 537 on BMP 02/17/22.   ? ? ?Assessment and Plan: ?* Abdominal pain ?Suspect peritonitis given peritoneal dialysis.  Patient was recently discharged with same but returned due to refractory symptoms at home.  Reportedly had rebound tenderness on admission. ?-- Continue empiric IV antibiotics ?-- Follow peritoneal fluid culture ?-- Supportive care ?-- Nephrology following for PD ? ? ? ? ?Acute respiratory failure with hypoxia ([place]) ?Most likely due to volume overload in the setting of ESRD.  Nephrology following for dialysis.  Also on IV Lasix.  Initial O2 requirement 4 L/min, this morning 2 L/min. ?-- Supplement O2 to maintain sats above 90%, wean as tolerated ?-- Other management as outlined ? ?SOB (shortness of breath) ?Due to volume overload in the setting of ESRD.  On IV Lasix and peritoneal dialysis does to be resumed. ?Nephrology consult.  ?Supplemental oxygen as needed. ?  ? ? ?End stage renal disease ([place]) ?On peritoneal dialysis,  presenting with peritonitis.  Nephrology following. ?Discussions underway regarding transition to hemodialysis.  Patient has been declining this, continuing to discuss with husband.  She cites her brother's experience with hemodialysis was miserable, does not want to put herself through that. ?-- See nephrology recommendations ? ? ? ?Essential hypertension ?BP is overall controlled with soft diastolic. ?--Currently on IV Lasix ?-- ? ?Hyperglycemia due to type 2 diabetes mellitus ([place]) ?Glucose on BMP this morning 537.  Had not yet been resumed on insulin. ?-- Increase Levemir 12>> 18 units twice daily ?--Sliding scale NovoLog, increase to 0-9 ?--Add mealtime NovoLog 2 units ?--Adjust insulin as needed for goal 140-180 ?--Appreciate diabetes coordinator recommendations ? ?Anemia ?Likely due to chronic disease. ?Hemoglobin stable.  Monitor CBC. ? ? ? ? ?  ? ?Subjective: Patient seated on bedside commode when seen today.  She reports abdominal pain little better but persistent.  No fevers or chills.  She is leaning towards continuing peritoneal dialysis.  Does not want to transition to hemo and cites her brother's miserable experience with dialysis at the end of his life.  She does not want to put her cell through this.  Continuing to talk with her husband about. ? ?Physical Exam: ?Vitals:  ? 02/17/22 0343 02/17/22 0624 02/17/22 0803 02/17/22 1200  ?BP: 139/66  (!) 148/69 140/68  ?Pulse: 66   68  ?Resp: 18   20  ?Temp: 97.9 ?F (36.6 ?C)  97.9 ?F (36.6 ?C) 98 ?F (36.7 ?C)  ?TempSrc: Oral  Oral Oral  ?SpO2: 97%  94% 95%  ?Weight:  88.1 kg 88.1 kg   ?Height:      ? ?General exam: awake, alert,  no acute distress chronically ill-appearing ?HEENT: atraumatic, clear conjunctiva, anicteric sclera, moist mucus membranes, hearing grossly normal  ?Respiratory system: Coarse bibasilar crackles, no wheezes, normal respiratory effort at rest on room air. ?Cardiovascular system: normal S1/S2, RRR ?Gastrointestinal system: soft, mild  tenderness on palpation without rebound or guarding. ?Central nervous system: A&O x3. no gross focal neurologic deficits, normal speech ?Extremities: moves all, no cyanosis, normal tone ?Skin: dry, intact, normal temperature ?Psychiatry: Depressed mood, congruent affect, judgement and insight appear normal ? ? ?Data Reviewed: ? ?Labs reviewed and notable for sodium 134, glucose 537 with CBGs 453, 444.  BUN 28.  Creatinine 4.91.  Calcium 8.2.  Hemoglobin 8.5.  MRSA PCR negative.  Peritoneal fluid culture so far with few wbc's, rare gram-positive cocci in chains but no growth at less than 12 hours ? ?Family Communication: None at bedside during encounter.  Will attempt to call husband as time allows ? ?Disposition: ?Status is: Inpatient ?Remains inpatient appropriate because: Severity of illness on IV antibiotics pending cultures ? ? ? Planned Discharge Destination: Home ? ? ? ?Time spent: 40 minutes ? ?Author: ?Ezekiel Slocumb, DO ?02/17/2022 5:37 PM ? ?For on call review [email or web address].  ?

## 2022-02-17 NOTE — Progress Notes (Signed)
PT Cancellation Note ? ?Patient Details ?Name: Andrea Stewart ?MRN: [id number] ?DOB: Dec 28, 1945 ? ? ?Cancelled Treatment:    Reason Eval/Treat Not Completed: Other (comment). Pt with BS >400, PT to re-attempt as able when patient is medically appropriate.  ? ?Lieutenant Diego PT, DPT ?3:32 PM,02/17/22 ? ?

## 2022-02-18 LAB — BODY FLUID CELL COUNT WITH DIFFERENTIAL
Eos, Fluid: 0 %
Lymphs, Fluid: 13 %
Monocyte-Macrophage-Serous Fluid: 36 % — ABNORMAL LOW (ref 50–90)
Neutrophil Count, Fluid: 51 % — ABNORMAL HIGH (ref 0–25)
Total Nucleated Cell Count, Fluid: 265 cu mm (ref 0–1000)

## 2022-02-18 LAB — BASIC METABOLIC PANEL
Anion gap: 9 (ref 5–15)
BUN: 24 mg/dL — ABNORMAL HIGH (ref 8–23)
CO2: 28 mmol/L (ref 22–32)
Calcium: 8.2 mg/dL — ABNORMAL LOW (ref 8.9–10.3)
Chloride: 94 mmol/L — ABNORMAL LOW (ref 98–111)
Creatinine, Ser: 4.55 mg/dL — ABNORMAL HIGH (ref 0.44–1.00)
GFR, Estimated: 9 mL/min — ABNORMAL LOW (ref >60)
Glucose, Bld: 585 mg/dL (ref 70–99)
Potassium: 3.2 mmol/L — ABNORMAL LOW (ref 3.5–5.1)
Sodium: 131 mmol/L — ABNORMAL LOW (ref 135–145)

## 2022-02-18 LAB — GLUCOSE, CAPILLARY
Glucose-Capillary: 491 mg/dL — ABNORMAL HIGH (ref 70–99)
Glucose-Capillary: 410 mg/dL — ABNORMAL HIGH (ref 70–99)
Glucose-Capillary: 159 mg/dL — ABNORMAL HIGH (ref 70–99)
Glucose-Capillary: 131 mg/dL — ABNORMAL HIGH (ref 70–99)
Glucose-Capillary: 144 mg/dL — ABNORMAL HIGH (ref 70–99)
Glucose-Capillary: 175 mg/dL — ABNORMAL HIGH (ref 70–99)

## 2022-02-18 LAB — PATHOLOGIST SMEAR REVIEW

## 2022-02-18 LAB — HEMOGLOBIN A1C
Hgb A1c MFr Bld: 10.8 % — ABNORMAL HIGH (ref 4.8–5.6)
Mean Plasma Glucose: 263 mg/dL

## 2022-02-18 LAB — VANCOMYCIN, RANDOM: Vancomycin Rm: 25

## 2022-02-18 MED ORDER — ONDANSETRON HCL 4 MG/2ML IJ SOLN: 4.0000 mg | Freq: Four times a day (QID) | INTRAMUSCULAR | Status: DC | PRN

## 2022-02-18 MED ORDER — DELFLEX-LC/1.5% DEXTROSE 344 MOSM/L IP SOLN: INTRAPERITONEAL | Status: DC

## 2022-02-18 MED ORDER — CEFTAZIDIME 200 MG/ML FOR DIALYSIS: 1000.0000 mg | INJECTION | Freq: Once | INTRAVENOUS_CENTRAL | Status: DC

## 2022-02-18 MED ORDER — POTASSIUM CHLORIDE CRYS ER 20 MEQ PO TBCR
40.0000 meq | EXTENDED_RELEASE_TABLET | ORAL | Status: AC
  Administered 2022-02-18 (×2): 40 meq via ORAL
  Filled 2022-02-18 (×2): qty 2

## 2022-02-18 MED ORDER — INSULIN ASPART 100 UNIT/ML IJ SOLN
10.0000 [IU] | Freq: Once | INTRAMUSCULAR | Status: AC
  Administered 2022-02-18: 10 [IU] via SUBCUTANEOUS
  Filled 2022-02-18: qty 1

## 2022-02-18 MED ORDER — ONDANSETRON HCL 4 MG PO TABS
4.0000 mg | ORAL_TABLET | Freq: Once | ORAL | Status: AC
  Administered 2022-02-18: 4 mg via ORAL
  Filled 2022-02-18: qty 1

## 2022-02-18 MED ORDER — DELFLEX-LC/1.5% DEXTROSE 344 MOSM/L IP SOLN
Freq: Once | INTRAVENOUS_CENTRAL | Status: AC
  Filled 2022-02-18: qty 0

## 2022-02-18 MED ORDER — VANCOMYCIN 100 MG/ML FOR DIALYSIS
Freq: Once | Status: DC
  Filled 2022-02-18: qty 3000

## 2022-02-18 MED ORDER — VANCOMYCIN 100 MG/ML FOR DIALYSIS
Freq: Once | Status: AC
  Filled 2022-02-18: qty 3000

## 2022-02-18 MED ORDER — EPOETIN ALFA 40000 UNIT/ML IJ SOLN
20000.0000 [IU] | INTRAMUSCULAR | Status: DC
  Administered 2022-02-18: 20000 [IU] via SUBCUTANEOUS
  Filled 2022-02-18: qty 1

## 2022-02-18 MED ORDER — INSULIN ASPART 100 UNIT/ML IJ SOLN
0.0000 [IU] | Freq: Three times a day (TID) | INTRAMUSCULAR | Status: DC
  Administered 2022-02-18: 15 [IU] via SUBCUTANEOUS
  Administered 2022-02-18: 3 [IU] via SUBCUTANEOUS
  Administered 2022-02-19: 11 [IU] via SUBCUTANEOUS
  Administered 2022-02-19: 3 [IU] via SUBCUTANEOUS
  Administered 2022-02-20: 15 [IU] via SUBCUTANEOUS
  Administered 2022-02-21: 3 [IU] via SUBCUTANEOUS
  Administered 2022-02-22: 2 [IU] via SUBCUTANEOUS
  Filled 2022-02-18 (×9): qty 1

## 2022-02-18 MED ORDER — INSULIN DETEMIR 100 UNIT/ML ~~LOC~~ SOLN
24.0000 [IU] | Freq: Two times a day (BID) | SUBCUTANEOUS | Status: DC
  Administered 2022-02-18 – 2022-02-19 (×3): 24 [IU] via SUBCUTANEOUS
  Filled 2022-02-18 (×4): qty 0.24

## 2022-02-18 MED ORDER — DELFLEX-LC/1.5% DEXTROSE 344 MOSM/L IP SOLN
Freq: Once | INTRAVENOUS_CENTRAL | Status: DC
  Filled 2022-02-18: qty 3000

## 2022-02-18 NOTE — Progress Notes (Signed)
?[facility]  ?ROUNDING NOTE  ? ?Subjective:  ? ?Idora Brosious is a 76 year old female with a past medical concerns including COPD, hypertension, depression, diabetes and end stage renal disease. Patient presents to ED with complaints vomiting, diarrhea and abdominal pain. She has been admitted for Shortness of breath [R06.02] ?Generalized abdominal pain [R10.84] ?Acute respiratory failure with hypoxia ([place]) [J96.01] ?Abdominal pain [R10.9] ?Hypervolemia, unspecified hypervolemia type [E87.70] ? ?She is known to our practice and receives outpatient follow-up with Dr. Juleen China.  Patient was recently admitted and discharged yesterday due to abdominal pain, increased swelling and shortness of breath.  Patient was suspected to have peritonitis and was discharged with an outpatient plan to follow-up with outpatient dialysis clinic to receive antibiotics with peritoneal dialysis treatments. ? ?Patient resting in bed ?Husband at bedside ?Continues to complain of abdominal pain.  ?States she just wants to go home, continue PD and if she dies, she dies ? ? ? ?Objective:  ?Vital signs in last 24 hours:  ?Temp:  [97.6 ?F (36.4 ?C)-98.6 ?F (37 ?C)] 98.6 ?F (37 ?C) (04/06 1224) ?Pulse Rate:  [60-82] 60 (04/06 1224) ?Resp:  [18-20] 18 (04/06 1224) ?BP: (110-148)/(48-99) 139/99 (04/06 1224) ?SpO2:  [93 %-100 %] 97 % (04/06 1224) ?Weight:  [87.5 kg-88.1 kg] 87.5 kg (04/06 0500) ? ?Weight change: -4.6 kg ?Filed Weights  ? 02/17/22 0803 02/17/22 1825 02/18/22 0500  ?Weight: 88.1 kg 88.1 kg 87.5 kg  ? ? ?Intake/Output: ?I/O last 3 completed shifts: ?In: 10000 [Other:10000] ?Out: 18563 [[id number]] ?  ?Intake/Output this shift: ? Total I/O ?In: 10120 [P.O.:120; Other:10000] ?Out: 58850 [[id number]] ? ?Physical Exam: ?General: NAD  ?Head: Normocephalic, atraumatic. Moist oral mucosal membranes  ?Eyes: Anicteric  ?Lungs:  Clear to auscultation, normal effort  ?Heart: Regular rate and rhythm  ?Abdomen:  Soft, tender, PD  catheter  ?Extremities:  2+BLE, 2+ BUE peripheral edema.  ?Neurologic: Alert, moving all four extremities  ?Skin: No lesions  ?Access: PD catheter  ? ? ?Basic Metabolic Panel: ?Recent Labs  ?Lab 02/14/22 ?2132 02/15/22 ?7672 02/16/22 ?0401 02/17/22 ?0947 02/18/22 ?0962  ?NA 136 138 137 134* 131*  ?K 4.4 4.2 4.5 3.7 3.2*  ?CL 100 100 101 99 94*  ?CO2 29 31 27 27 28   ?GLUCOSE 501* 248* 263* 537* 585*  ?BUN 19 20 28* 28* 24*  ?CREATININE 3.78* 3.97* 5.24* 4.91* 4.55*  ?CALCIUM 8.6* 8.7* 8.4* 8.2* 8.2*  ? ? ? ?Liver Function Tests: ?Recent Labs  ?Lab 02/14/22 ?2132 02/15/22 ?8366  ?AST 31 26  ?ALT 22 19  ?ALKPHOS 83 77  ?BILITOT 0.6 0.6  ?PROT 6.1* 5.8*  ?ALBUMIN 2.0* 2.0*  ? ? ?No results for input(s): LIPASE, AMYLASE in the last 168 hours. ?No results for input(s): AMMONIA in the last 168 hours. ? ?CBC: ?Recent Labs  ?Lab 02/14/22 ?2132 02/15/22 ?2947 02/15/22 ?1615 02/16/22 ?0401 02/17/22 ?6546  ?WBC 7.6 13.7* 8.6 7.6 6.1  ?NEUTROABS  --   --   --   --  3.4  ?HGB 10.4* 9.7* 8.8* 8.7* 8.5*  ?HCT 33.5* 31.1* 29.0* 28.7* 27.6*  ?MCV 98.0 99.4 99.0 100.3* 97.2  ?PLT 305 291 262 240 260  ? ? ? ?Cardiac Enzymes: ?No results for input(s): CKTOTAL, CKMB, CKMBINDEX, TROPONINI in the last 168 hours. ? ?BNP: ?Invalid input(s): POCBNP ? ?CBG: ?Recent Labs  ?Lab 02/17/22 ?1800 02/17/22 ?2031 02/18/22 ?5035 02/18/22 ?4656 02/18/22 ?1222  ?GLUCAP 217* 292* 491* 410* 159*  ? ? ? ?Microbiology: ?Results for orders placed or performed  during the hospital encounter of 02/14/22  ?Resp Panel by RT-PCR (Flu A&B, Covid) Nasopharyngeal Swab     Status: None  ? Collection Time: 02/14/22 10:07 PM  ? Specimen: Nasopharyngeal Swab; Nasopharyngeal(NP) swabs in vial transport medium  ?Result Value Ref Range Status  ? SARS Coronavirus 2 by RT PCR NEGATIVE NEGATIVE Final  ?  Comment: (NOTE) ?SARS-CoV-2 target nucleic acids are NOT DETECTED. ? ?The SARS-CoV-2 RNA is generally detectable in upper respiratory ?specimens during the acute phase of  infection. The lowest ?concentration of SARS-CoV-2 viral copies this assay can detect is ?138 copies/mL. A negative result does not preclude SARS-Cov-2 ?infection and should not be used as the sole basis for treatment or ?other patient management decisions. A negative result may occur with  ?improper specimen collection/handling, submission of specimen other ?than nasopharyngeal swab, presence of viral mutation(s) within the ?areas targeted by this assay, and inadequate number of viral ?copies(<138 copies/mL). A negative result must be combined with ?clinical observations, patient history, and epidemiological ?information. The expected result is Negative. ? ?Fact Sheet for Patients:  ?EntrepreneurPulse.com.au ? ?Fact Sheet for Healthcare Providers:  ?IncredibleEmployment.be ? ?This test is no t yet approved or cleared by the [place] FDA and  ?has been authorized for detection and/or diagnosis of SARS-CoV-2 by ?FDA under an Emergency Use Authorization (EUA). This EUA will remain  ?in effect (meaning this test can be used) for the duration of the ?COVID-19 declaration under Section 564(b)(1) of the Act, 21 ?U.S.C.section 360bbb-3(b)(1), unless the authorization is terminated  ?or revoked sooner.  ? ? ?  ? Influenza A by PCR NEGATIVE NEGATIVE Final  ? Influenza B by PCR NEGATIVE NEGATIVE Final  ?  Comment: (NOTE) ?The Xpert Xpress SARS-CoV-2/FLU/RSV plus assay is intended as an aid ?in the diagnosis of influenza from Nasopharyngeal swab specimens and ?should not be used as a sole basis for treatment. Nasal washings and ?aspirates are unacceptable for Xpert Xpress SARS-CoV-2/FLU/RSV ?testing. ? ?Fact Sheet for Patients: ?EntrepreneurPulse.com.au ? ?Fact Sheet for Healthcare Providers: ?IncredibleEmployment.be ? ?This test is not yet approved or cleared by the [place] FDA and ?has been authorized for detection and/or diagnosis of SARS-CoV-2  by ?FDA under an Emergency Use Authorization (EUA). This EUA will remain ?in effect (meaning this test can be used) for the duration of the ?COVID-19 declaration under Section 564(b)(1) of the Act, 21 U.S.C. ?section 360bbb-3(b)(1), unless the authorization is terminated or ?revoked. ? ?Performed at [facility], [place], ?[name] [address] ?  ?CULTURE, BLOOD (ROUTINE X 2) w Reflex to ID Panel     Status: None (Preliminary result)  ? Collection Time: 02/15/22  4:14 PM  ? Specimen: BLOOD  ?Result Value Ref Range Status  ? Specimen Description BLOOD RW  Final  ? Special Requests BOTTLES DRAWN AEROBIC AND ANAEROBIC BCAV  Final  ? Culture   Final  ?  NO GROWTH 3 DAYS ?Performed at [facility], [address] [place], [place] [address] ?  ? Report Status PENDING  Incomplete  ?CULTURE, BLOOD (ROUTINE X 2) w Reflex to ID Panel     Status: None (Preliminary result)  ? Collection Time: 02/15/22  4:15 PM  ? Specimen: BLOOD  ?Result Value Ref Range Status  ? Specimen Description BLOOD [facility]  Final  ? Special Requests BOTTLES DRAWN AEROBIC AND ANAEROBIC BCAV  Final  ? Culture   Final  ?  NO GROWTH 3 DAYS ?Performed at [facility], [place], [place],  [place], [place] [address] ?  ? Report Status PENDING  Incomplete  ?Body fluid culture w Gram Stain     Status: None (Preliminary result)  ? Collection Time: 02/16/22  8:25 PM  ? Specimen: Peritoneal Washings; Body Fluid  ?Result Value Ref Range Status  ? Specimen Description   Final  ?  PERITONEAL ?Performed at [facility], [address], [place], [place] [address] ?  ? Special Requests   Final  ?  NONE ?Performed at [facility], [address], [place], [place] [address] ?  ? Gram Stain   Final  ?  FEW WBC PRESENT, PREDOMINANTLY PMN ?RARE GRAM POSITIVE COCCI IN CHAINS ?  ? Culture   Final  ?  NO GROWTH 2 DAYS ?Performed at [facility], [place] [address], [place], [place] [address] ?  ? Report Status PENDING   Incomplete  ?MRSA Next Gen by PCR, Nasal     Status: None  ? Collection Time: 02/17/22 12:14 PM  ? Specimen: Nasal Mucosa; Nasal Swab  ?Result Value Ref Range Status  ? MRSA by PCR Next Gen NOT DETECTED NOT D

## 2022-02-18 NOTE — Care Management Important Message (Signed)
Important Message ? ?Patient Details  ?Name: Andrea Stewart ?MRN: [id number] ?Date of Birth: 04-22-1946 ? ? ?Medicare Important Message Given:  Yes ? ? ? ? ?Dannette Barbara ?02/18/2022, 1:38 PM ?

## 2022-02-18 NOTE — Consult Note (Addendum)
Pharmacy Antibiotic Note ? ?Andrea Stewart is a 76 y.o. female w/  h/o ESRD-PD, COPD, morbid obesity to ED c/o SOB and CC of abd'l pain admitted on 02/14/2022 with  intra-abdominal infection  (suspected peritonitis ).  Pharmacy has been consulted for Vancomycin & ceftazidime dosing. ? ?Plan: ?Ceftazidime 1g q24h in PD fluid. Pt to only receive 2 L of the dialysis solution of the 3 L bag, will add 500 mg of ceftazidime to the bag so patient will get a total of 1 g in 2 L.  ?Vancomycin random 25. Will decrease the vancomycin dose to 1000 mg from 1500 mg with PD fluid. Pt will only get around 700 mg of vancomycin, which is appropriate considering vancomycin level was supratherapeutic.  Plan to maintain the vancomycin level between 15-20. Vancomycin random ordered with AM labs.   ? ? ?Height: 4\' 11"  (149.9 cm) ?Weight: 87.5 kg (192 lb 12.8 oz) ?IBW/kg (Calculated) : 43.2 ? ?Temp (24hrs), Avg:98 ?F (36.7 ?C), Min:97.6 ?F (36.4 ?C), Max:98.3 ?F (36.8 ?C) ? ?Recent Labs  ?Lab 02/14/22 ?2132 02/15/22 ?8099 02/15/22 ?1615 02/16/22 ?0401 02/17/22 ?8338 02/18/22 ?2505  ?WBC 7.6 13.7* 8.6 7.6 6.1  --   ?CREATININE 3.78* 3.97*  --  5.24* 4.91* 4.55*  ?LATICACIDVEN 2.2* 1.0  --   --   --   --   ?VANCORANDOM  --   --   --   --   --  25  ? ?  ?Estimated Creatinine Clearance: 10.1 mL/min (A) (by C-G formula based on SCr of 4.55 mg/dL (H)).   ? ?No Known Allergies ? ?Antimicrobials this admission: ?Vancomycin / Ceftazidime (4/03 >>  ? ?Dose adjustments this admission: ?ESRD-PD -- CTM based on levels at 3-5d for Vanco dosing. ? ?Microbiology results: ?4/03 BCx: NG x 2 days ?4/03 Flu/Cov: negative  ?4/03 MRSA PCR: negative  ? ?Thank you for allowing pharmacy to be a part of this patient?s care. ? ?Eleonore Chiquito, PharmD, BCPS ?02/18/2022 7:28 AM ? ? ? ?

## 2022-02-18 NOTE — Progress Notes (Signed)
?Progress Note ? ? ?Patient: Andrea Stewart LKG:[id number] DOB: 1946-05-12 DOA: 02/14/2022     3 ?DOS: the patient was seen and examined on 02/18/2022 ?  ?Brief hospital course: ?Andrea Stewart is a 76 y.o. female with medical history significant for recent discharge from the hospital on 02/14/2022 after hospitalization for atrial fibrillation with RVR and abdominal pain concerning for peritonitis, ESRD on peritoneal dialysis, COPD, morbid obesity, hypertension, anemia of chronic disease, type 2 diabetes mellitus, who was brought to the hospital because of abdominal pain and shortness of breath.  She initially required 4 L/min supplemental oxygen. ? ?Admitted to the hospital.  Started on empiric antibiotics pending peritoneal fluid cultures.  Nephrology consulted and recommending consideration to transition from PD to hemodialysis but patient declines to do so.  Also being treated with IV Lasix for ongoing edema and dyspnea. ? ?Patient does not want to pursue hemodialysis, if PD were to stop working well enough to remove adequate fluid.   ?Palliative Care consulted.  ? ? ? ?Assessment and Plan: ?* Abdominal pain ?Suspect peritonitis given peritoneal dialysis.  Patient was recently discharged with same but returned due to refractory symptoms at home.  Reportedly had rebound tenderness on admission. ?-- Continue empiric IV antibiotics ?-- Follow peritoneal fluid culture ?-- Supportive care ?-- Nephrology following for PD ? ? ? ? ?Acute respiratory failure with hypoxia ([place]) ?Most likely due to volume overload in the setting of ESRD.  Nephrology following for dialysis.  Also on IV Lasix.  Initial O2 requirement 4 L/min, this morning 2 L/min. ?-- Supplement O2 to maintain sats above 90%, wean as tolerated ?-- Other management as outlined ? ?SOB (shortness of breath) ?Due to volume overload in the setting of ESRD.  On IV Lasix and peritoneal dialysis does to be resumed. ?Nephrology consult.  ?Supplemental oxygen as needed. ?   ? ? ?End stage renal disease ([place]) ?On peritoneal dialysis, presenting with peritonitis.  Nephrology following. ?Discussions underway regarding transition to hemodialysis.  Patient has been declining this, continuing to discuss with husband.  She cites her brother's experience with hemodialysis was miserable, does not want to put herself through that. ?-- See nephrology recommendations ? ? ? ?Essential hypertension ?BP is overall controlled with soft diastolic. ?--Currently on IV Lasix ?-- ? ?Hyperglycemia due to type 2 diabetes mellitus ([place]) ?Uncontrolled, A1c is 10.8% ?Glucose on BMP this morning 537.  Had not yet been resumed on insulin. ?-- Increase Levemir 18>>24 units BID ?--Sliding scale NovoLog, increase to 0-15 ?--Add mealtime NovoLog 2 units ?--Adjust insulin as needed for goal 140-180 ?--Appreciate diabetes coordinator recommendations ? ?Anemia ?Likely due to chronic disease. ?Hemoglobin stable.  Monitor CBC. ? ? ? ? ?  ? ?Subjective: Pt up in recliner, husband at bedside.  PD nurse present instilling antibiotics.  Pt tells me she would not want to do hemodialysis, and would choose to go on hospice if PD not working well enough.  Reports some abdominal discomfort mostly from distention, but overall abdominal pain is improved. ? ? ?Physical Exam: ?Vitals:  ? 02/18/22 0430 02/18/22 0500 02/18/22 1224 02/18/22 1635  ?BP: (!) 148/64  (!) 139/99 (!) 141/79  ?Pulse: 75  60 (!) 57  ?Resp: 18  18 20   ?Temp: 97.8 ?F (36.6 ?C)  98.6 ?F (37 ?C) 97.8 ?F (36.6 ?C)  ?TempSrc:   Oral Oral  ?SpO2: 100%  97% 97%  ?Weight:  87.5 kg    ?Height:      ? ?General exam: awake, alert, no acute  distress ?HEENT: moist mucus membranes, hearing grossly normal  ?Respiratory system: coarse crackles, normal respiratory effort at rest. ?Cardiovascular system: normal S1/S2, RRR  ?Gastrointestinal system: soft, NT, ND, no HSM felt, +bowel sounds. ?Central nervous system: A&O x4. no gross focal neurologic deficits, normal  speech ?Extremities: moves all, b/l LE edema, normal tone ?Skin: dry, intact, normal temperature ?Psychiatry: depressed mood, congruent affect, judgement and insight appear normal ? ? ?Data Reviewed: ? ?Labs reviewed and notable for Sodium 131, potassium 3.2, chloride 94, glucose 585, subsequent CBGs 159, 131), BUN 24, creatinine 4.55, calcium 8.2, GFR 9 ? ?Family Communication: husband at bedside on rounds ? ?Disposition: ?Status is: Inpatient ?Remains inpatient appropriate because: remains on IV antibiotics pending cultures as outlined ? ? Planned Discharge Destination: Home ? ? ? ?Time spent: 35 minutes ? ?Author: ?Ezekiel Slocumb, DO ?02/18/2022 7:02 PM ? ?For on call review [email or web address]  ?

## 2022-02-18 NOTE — Progress Notes (Signed)
Occupational Therapy Treatment ?Patient Details ?Name: Andrea Stewart ?MRN: [id number] ?DOB: [birth year] ?Today's Date: 02/18/2022 ? ? ?History of present illness Patient is a 76 y.o.  female with a past medical concerns including COPD, hypertension, depression, diabetes and end stage renal disease. Patient presents to ED with complaints vomiting, diarrhea and abdominal pain. Admitted for shortness of breath, acute respiratory failure with hypoxia, abdominal pain, hypervolemia ?  ?OT comments ? Pt continues to present with generalized weakness, limited endurance, dyspnea, fatigue, and pain. She engaged in bed mobility, transfers, toileting, grooming, ambulation with therapist, generally moving well without much physical support but requiring frequent rest breaks between each step and relying heavily on RW/UE support to maintain standing balance. Max A for pericare in standing. Following information provided by nephrology NP and MD in room re: likelihood of pt not being able to continue PD, pt states she does not want HD. Lengthy discussions of goals of care and DC options ensued. Pt states she will continue to discuss with her husband and son, but at present reports she does not want HD and will not consider SNF care. She would like a consult with palliative care/hospice. Pt and her husband are familiar with [facility], as their daughter, who had CA, died there only last month. Will continue to provide OT services as pt is hospitalized, to improve strength, endurance, and balance.  ? ?Recommendations for follow up therapy are one component of a multi-disciplinary discharge planning process, led by the attending physician.  Recommendations may be updated based on patient status, additional functional criteria and insurance authorization. ?   ?Follow Up Recommendations ? Home health OT  ?  ?Assistance Recommended at Discharge Frequent or constant Supervision/Assistance  ?Patient can return home with the following ? A  lot of help with walking and/or transfers;A lot of help with bathing/dressing/bathroom;Assistance with cooking/housework;Assist for transportation ?  ?Equipment Recommendations ? None recommended by OT  ?  ?Recommendations for Other Services  (Hospice/palliative care consult. Pt reports she is considering hospice care rather than continued dialysis) ? ?  ?Precautions / Restrictions Precautions ?Precautions: Fall ?Restrictions ?Weight Bearing Restrictions: No  ? ? ?  ? ?Mobility Bed Mobility ?Overal bed mobility: Needs Assistance ?Bed Mobility: Supine to Sit ?  ?  ?Supine to sit: Min assist ?  ?  ?General bed mobility comments: has to rest on EOB for 5+ minutes following sup<sit ?  ? ?Transfers ?Overall transfer level: Needs assistance ?Equipment used: Rolling walker (2 wheels) ?Transfers: Sit to/from Stand, Bed to chair/wheelchair/BSC ?Sit to Stand: Min assist, From elevated surface ?  ?  ?Step pivot transfers: Min guard ?  ?  ?  ?  ?  ?Balance Overall balance assessment: Needs assistance ?Sitting-balance support: Feet supported, Bilateral upper extremity supported ?Sitting balance-Leahy Scale: Good ?  ?  ?Standing balance support: Bilateral upper extremity supported ?Standing balance-Leahy Scale: Good ?Standing balance comment: Pt's standing balance is better today than during OT session 2 days previous ?  ?  ?  ?  ?  ?  ?  ?  ?  ?  ?  ?   ? ?ADL either performed or assessed with clinical judgement  ? ?ADL Overall ADL's : Needs assistance/impaired ?  ?  ?  ?  ?  ?  ?  ?  ?  ?  ?  ?  ?Toilet Transfer: BSC/3in1;Rolling walker (2 wheels);Supervision/safety ?  ?Toileting- Clothing Manipulation and Hygiene: Sit to/from stand;Maximal assistance ?  ?  ?  ?  ?  ?  ? ?  Extremity/Trunk Assessment Upper Extremity Assessment ?Upper Extremity Assessment: Generalized weakness ?  ?Lower Extremity Assessment ?Lower Extremity Assessment: Generalized weakness ?  ?Cervical / Trunk Assessment ?Cervical / Trunk Assessment: Normal ?   ? ?Vision Patient Visual Report: No change from baseline ?  ?  ?Perception   ?  ?Praxis   ?  ? ?Cognition Arousal/Alertness: Awake/alert ?Behavior During Therapy: [facility] for tasks assessed/performed ?Overall Cognitive Status: Within Functional Limits for tasks assessed ?  ?  ?  ?  ?  ?  ?  ?  ?  ?  ?  ?  ?  ?  ?  ?  ?  ?  ?  ?   ?Exercises Other Exercises ?Other Exercises: Discussion w/ pt, spouse, MD, NP, nurse re: DC opts, goals of care ? ?  ?Shoulder Instructions   ? ? ?  ?General Comments    ? ? ?Pertinent Vitals/ Pain       Pain Assessment ?Pain Assessment: 0-10 ?Pain Score: 5  ?Pain Location: back, abdomen ?Pain Descriptors / Indicators: Aching ?Pain Intervention(s): Repositioned, Relaxation ? ?Home Living   ?  ?  ?  ?  ?  ?  ?  ?  ?  ?  ?  ?  ?  ?  ?  ?  ?  ?  ? ?  ?Prior Functioning/Environment    ?  ?  ?  ?   ? ?Frequency ? Min 2X/week  ? ? ? ? ?  ?Progress Toward Goals ? ?OT Goals(current goals can now be found in the care plan section) ? Progress towards OT goals: Progressing toward goals ? ?Acute Rehab OT Goals ?Patient Stated Goal: to go home ?OT Goal Formulation: With patient/family ?Time For Goal Achievement: 03/02/22 ?Potential to Achieve Goals: Good  ?Plan Discharge plan remains appropriate;Frequency needs to be updated   ? ?Co-evaluation ? ? ?   ?  ?  ?  ?  ? ?  ?AM-PAC OT "6 Clicks" Daily Activity     ?Outcome Measure ? ? Help from another person eating meals?: A Little ?Help from another person taking care of personal grooming?: A Little ?Help from another person toileting, which includes using toliet, bedpan, or urinal?: A Lot ?Help from another person bathing (including washing, rinsing, drying)?: A Lot ?Help from another person to put on and taking off regular upper body clothing?: A Little ?Help from another person to put on and taking off regular lower body clothing?: A Lot ?6 Click Score: 15 ? ?  ?End of Session Equipment Utilized During Treatment: Rolling walker (2 wheels) ? ?OT Visit  Diagnosis: Unsteadiness on feet (R26.81);Muscle weakness (generalized) (M62.81) ?  ?Activity Tolerance Patient limited by fatigue ?  ?Patient Left in chair;with call bell/phone within reach;with family/visitor present ?  ?Nurse Communication Mobility status ?  ? ?   ? ?Time: 9147-8295 ?OT Time Calculation (min): 50 min ? ?Charges: OT General Charges ?$OT Visit: 1 Visit ?OT Treatments ?$Self Care/Home Management : 38-52 mins ? ?Josiah Lobo, PhD, MS, OTR/L ?02/18/22, 2:07 PM ? ?

## 2022-02-18 NOTE — Progress Notes (Signed)
Inpatient Diabetes Program Recommendations ? ?AACE/ADA: New Consensus Statement on Inpatient Glycemic Control (2015) ? ?Target Ranges:  Prepandial:   less than 140 mg/dL ?     Peak postprandial:   less than 180 mg/dL (1-2 hours) ?     Critically ill patients:  140 - 180 mg/dL  ? ? Latest Reference Range & Units 02/17/22 06:13 02/17/22 12:20 02/17/22 18:00 02/17/22 20:31 02/18/22 06:09 02/18/22 08:08  ?Glucose-Capillary 70 - 99 mg/dL 453 (H) 444 (H) 217 (H) 292 (H) 491 (H) 410 (H)  ? ?History: DM, ESRD  ? ?Home DM Meds: NPH 22 units AM/ 28 units PM  ?  Humalog 3-5 units TID  ? ? ?Current Orders: Novolog 0-15 units TID  ?  Levemir 24 units bid ?  Novolog 2 units tid meal coverage ?   ?Note Levemir and Novolog Correction increased today. ? ?-  Also consider increase Novolog meal coverage to 8 units tid if eating >50% of meals ? ? ?--Will follow patient during hospitalization-- ? ?Tama Headings RN, MSN, BC-ADM ?Inpatient Diabetes Coordinator ?Team Pager [phone] (8a-5p) ?

## 2022-02-18 NOTE — Progress Notes (Signed)
Date and time results received: 02/18/22 0615 ? ? ?Test: glucose ?Critical Value: 585 ? ?Name of Provider Notified: [phone] ? ?Orders Received? Or Actions Taken?: Orders Received - See Orders for details ?

## 2022-02-18 NOTE — Progress Notes (Signed)
Patient seen by HD nurse for placement of dialysate with antibiotic. Dialysate to dwell until 1830, then continue with PD as scheduled.  ?

## 2022-02-18 NOTE — Progress Notes (Signed)
PT Cancellation Note ? ?Patient Details ?Name: Andrea Stewart ?MRN: [id number] ?DOB: [birth year] ? ? ?Cancelled Treatment:     Pt politely refused stating she had just worked with OT, was fatigued, and currently visiting with family.  Will try again next available time/date. ? ? ?Josie Dixon ?02/18/2022, 3:20 PM ?

## 2022-02-18 NOTE — Progress Notes (Signed)
Mobility Specialist - Progress Note ? ? ? 02/18/22 1600  ?Mobility  ?Activity Ambulated with assistance in room;Stood at bedside;Dangled on edge of bed;Transferred to/from [facility]  ?Level of Assistance +2 (takes two people)  ?Assistive Device Front wheel walker  ?Distance Ambulated (ft) 5 ft  ?Activity Response Tolerated well  ?$Mobility charge 1 Mobility  ? ? ?Pt on BSC using RA upon arrival. NT requests assist for BSC-B transfer. Completes STS +2 MinA, stands ~ 1 minute for ModA with pericare. Ambulates 5 ft with RW and MinA to descend to sit EOB. Dangles EOB indep. Pt is left with NT x2.  ? ?Merrily Brittle ?Mobility Specialist ?02/18/22, 4:38 PM ? ? ? ? ?

## 2022-02-19 DIAGNOSIS — R112 Nausea with vomiting, unspecified: Secondary | ICD-10-CM | POA: Diagnosis present

## 2022-02-19 LAB — BASIC METABOLIC PANEL
Anion gap: 9 (ref 5–15)
BUN: 18 mg/dL (ref 8–23)
CO2: 26 mmol/L (ref 22–32)
Calcium: 8.1 mg/dL — ABNORMAL LOW (ref 8.9–10.3)
Chloride: 98 mmol/L (ref 98–111)
Creatinine, Ser: 4.22 mg/dL — ABNORMAL HIGH (ref 0.44–1.00)
GFR, Estimated: 10 mL/min — ABNORMAL LOW (ref >60)
Glucose, Bld: 412 mg/dL — ABNORMAL HIGH (ref 70–99)
Potassium: 3 mmol/L — ABNORMAL LOW (ref 3.5–5.1)
Sodium: 133 mmol/L — ABNORMAL LOW (ref 135–145)

## 2022-02-19 LAB — GLUCOSE, CAPILLARY
Glucose-Capillary: 310 mg/dL — ABNORMAL HIGH (ref 70–99)
Glucose-Capillary: 179 mg/dL — ABNORMAL HIGH (ref 70–99)
Glucose-Capillary: 39 mg/dL — CL (ref 70–99)
Glucose-Capillary: 44 mg/dL — CL (ref 70–99)
Glucose-Capillary: 76 mg/dL (ref 70–99)
Glucose-Capillary: 135 mg/dL — ABNORMAL HIGH (ref 70–99)

## 2022-02-19 LAB — VANCOMYCIN, RANDOM: Vancomycin Rm: 34

## 2022-02-19 MED ORDER — INSULIN DETEMIR 100 UNIT/ML ~~LOC~~ SOLN
24.0000 [IU] | Freq: Every day | SUBCUTANEOUS | Status: DC
  Administered 2022-02-20 – 2022-02-22 (×2): 24 [IU] via SUBCUTANEOUS
  Filled 2022-02-19 (×3): qty 0.24

## 2022-02-19 MED ORDER — INSULIN ASPART 100 UNIT/ML IJ SOLN
4.0000 [IU] | Freq: Three times a day (TID) | INTRAMUSCULAR | Status: DC
  Administered 2022-02-21 – 2022-02-22 (×2): 4 [IU] via SUBCUTANEOUS
  Filled 2022-02-19 (×4): qty 1

## 2022-02-19 MED ORDER — POTASSIUM CHLORIDE CRYS ER 20 MEQ PO TBCR
40.0000 meq | EXTENDED_RELEASE_TABLET | ORAL | Status: AC
  Filled 2022-02-19: qty 2

## 2022-02-19 MED ORDER — PROCHLORPERAZINE EDISYLATE 10 MG/2ML IJ SOLN
10.0000 mg | Freq: Four times a day (QID) | INTRAMUSCULAR | Status: DC | PRN
  Administered 2022-02-19: 10 mg via INTRAVENOUS
  Filled 2022-02-19 (×4): qty 2

## 2022-02-19 MED ORDER — INSULIN DETEMIR 100 UNIT/ML ~~LOC~~ SOLN
30.0000 [IU] | Freq: Every day | SUBCUTANEOUS | Status: DC
  Administered 2022-02-19 – 2022-02-21 (×3): 30 [IU] via SUBCUTANEOUS
  Filled 2022-02-19 (×4): qty 0.3

## 2022-02-19 MED ORDER — ONDANSETRON 4 MG PO TBDP
4.0000 mg | ORAL_TABLET | Freq: Three times a day (TID) | ORAL | Status: DC | PRN
  Administered 2022-02-19 – 2022-02-21 (×5): 4 mg via ORAL
  Filled 2022-02-19 (×6): qty 1

## 2022-02-19 MED ORDER — ALPRAZOLAM 0.5 MG PO TABS
0.5000 mg | ORAL_TABLET | Freq: Every day | ORAL | Status: DC
  Administered 2022-02-19 – 2022-02-21 (×3): 0.5 mg via ORAL
  Filled 2022-02-19 (×3): qty 1

## 2022-02-19 MED ORDER — DELFLEX-LC/1.5% DEXTROSE 344 MOSM/L IP SOLN
Freq: Once | INTRAVENOUS_CENTRAL | Status: AC
  Filled 2022-02-19: qty 0

## 2022-02-19 MED FILL — Water For Injection: INTRAMUSCULAR | Qty: 20 | Status: AC

## 2022-02-19 MED FILL — Ceftazidime For IV Soln 2 GM: INTRAVENOUS | Qty: 2 | Status: AC

## 2022-02-19 MED FILL — Peritoneal Dialysis Solutions 344 MOSM/L: INTRAPERITONEAL | Qty: 3000 | Status: AC

## 2022-02-19 NOTE — Progress Notes (Signed)
?  ?  Palliative Medicine Team ? ? ? ?Palliative Medicine consult noted. Due to high referral volume, there may be a delay seeing this patient. Please call the Palliative Medicine Team office at [phone] if recommendations are needed in the interim. A provider will be available on site 02/22/2022. If patient is expected to discharge before this date, please make outpatient palliative care referral as appropriate.  ?  ? ?  ? ?Thank you for inviting [place] to participate in the care of this patient. ?  ?Damian Leavell, MSN, RN ?Palliative Medicine Team  ?

## 2022-02-19 NOTE — Progress Notes (Addendum)
16:48- Blood glucose up to 44, but patient eating. Dr. Arbutus Ped stated if glucose not up after dinner than to give IV dextrose.  ? ?16:30- Finger stick glucose 39. Patient alert and talking to RN. Juice and crackers given. Dr. Arbutus Ped notified.  ? ?

## 2022-02-19 NOTE — Consult Note (Signed)
Pharmacy Antibiotic Note ? ?Andrea Stewart is a 76 y.o. female w/  h/o ESRD-PD, COPD, morbid obesity to ED c/o SOB and CC of abd'l pain admitted on 02/14/2022 with  intra-abdominal infection  (suspected peritonitis ).  Pharmacy has been consulted for Vancomycin & ceftazidime dosing. ? ?Plan: ?Ceftazidime 1.5g q24h in PD fluid.  ?Vancomycin random 34.  Plan to maintain the vancomycin level between 15-20. Recommend ordering vancomycin level on Monday (4/10) ? ? ?Height: 4\' 11"  (149.9 cm) ?Weight: 90.7 kg (199 lb 15.3 oz) ?IBW/kg (Calculated) : 43.2 ? ?Temp (24hrs), Avg:98.3 ?F (36.8 ?C), Min:97.8 ?F (36.6 ?C), Max:98.6 ?F (37 ?C) ? ?Recent Labs  ?Lab 02/14/22 ?2132 02/15/22 ?2706 02/15/22 ?1615 02/16/22 ?0401 02/17/22 ?2376 02/18/22 ?2831 02/19/22 ?0429  ?WBC 7.6 13.7* 8.6 7.6 6.1  --   --   ?CREATININE 3.78* 3.97*  --  5.24* 4.91* 4.55* 4.22*  ?LATICACIDVEN 2.2* 1.0  --   --   --   --   --   ?VANCORANDOM  --   --   --   --   --  25 34  ? ?  ?Estimated Creatinine Clearance: 11.1 mL/min (A) (by C-G formula based on SCr of 4.22 mg/dL (H)).   ? ?No Known Allergies ? ?Antimicrobials this admission: ?Vancomycin / Ceftazidime (4/03 >>  ? ?Dose adjustments this admission: ?ESRD-PD -- CTM based on levels at 3-5d for Vanco dosing. ? ?Microbiology results: ?4/03 BCx: NG x 2 days ?4/03 Flu/Cov: negative  ?4/03 MRSA PCR: negative  ? ?Thank you for allowing pharmacy to be a part of this patient?s care. ? ?Eleonore Chiquito, PharmD, BCPS ?02/19/2022 7:44 AM ? ? ? ?

## 2022-02-19 NOTE — Progress Notes (Signed)
Peritoneal dialysis patient known at [place] ([place]). Patient stated no dialysis concerns, only expressed strong desire to not switch to hemodialysis if it becomes necessary.    ?

## 2022-02-19 NOTE — Assessment & Plan Note (Signed)
Antiemetics PRN. ?Likely related to peritonitis, ESRD/uremia, medications. ?

## 2022-02-19 NOTE — Progress Notes (Signed)
PT Cancellation Note ? ?Patient Details ?Name: Andrea Stewart ?MRN: [id number] ?DOB: 24-Aug-1946 ? ? ?Cancelled Treatment:    Reason Eval/Treat Not Completed: Other (comment) ? ?Pt in chair and c/o feeling nauseous.  She stated she has dialysis at 2:00 today and does not feel like she will be up to therapy today.  Encouraged continued OOB as tolerated over the week-end. ? ? ?Chesley Noon ?02/19/2022, 12:07 PM ?

## 2022-02-19 NOTE — Progress Notes (Addendum)
?Progress Note ? ? ?Patient: Andrea Stewart AQT:[id number] DOB: 08-01-1946 DOA: 02/14/2022     4 ?DOS: the patient was seen and examined on 02/19/2022 ?  ?Brief hospital course: ?Andrea Stewart is a 76 y.o. female with medical history significant for recent discharge from the hospital on 02/14/2022 after hospitalization for atrial fibrillation with RVR and abdominal pain concerning for peritonitis, ESRD on peritoneal dialysis, COPD, morbid obesity, hypertension, anemia of chronic disease, type 2 diabetes mellitus, who was brought to the hospital because of abdominal pain and shortness of breath.  She initially required 4 L/min supplemental oxygen. ? ?Admitted to the hospital.  Started on empiric antibiotics pending peritoneal fluid cultures.  Nephrology consulted and recommending consideration to transition from PD to hemodialysis but patient declines to do so.  Also being treated with IV Lasix for ongoing edema and dyspnea. ? ?Patient does not want to pursue hemodialysis, if PD were to stop working well enough to remove adequate fluid.   ?Palliative Care consulted.  ? ? ? ?Assessment and Plan: ?* Abdominal pain ?Improved since admission, not fully resolved. ?Suspect peritonitis given peritoneal dialysis.  Patient was recently discharged with same but returned due to refractory symptoms at home.  Reportedly had rebound tenderness on admission. ?-- Continue empiric IV antibiotics ?-- Follow peritoneal fluid cultures ?-- Supportive care ?-- Nephrology following for PD ? ? ? ? ?Acute respiratory failure with hypoxia ([place]) ?Most likely due to volume overload in the setting of ESRD.  Nephrology following for dialysis.  Also on IV Lasix.  Initial O2 requirement 4 L/min, this morning 2 L/min. ?-- Supplement O2 to maintain sats above 90%, wean as tolerated ?-- Other management as outlined ? ?SOB (shortness of breath) ?Due to volume overload in the setting of ESRD.  On IV Lasix and peritoneal dialysis does to be  resumed. ?Nephrology consult.  ?Supplemental oxygen as needed. ?  ? ? ?End stage renal disease ([place]) ?On peritoneal dialysis, presenting with peritonitis.  Nephrology following. ?Discussions underway regarding transition to hemodialysis.  Patient has been declining this, continuing to discuss with husband.  She cites her brother's experience with hemodialysis was miserable, does not want to put herself through that. ?-- See nephrology recommendations ?-- Continue PD ? ? ? ?Essential hypertension ?BP is overall controlled. ?--Currently on IV Lasix ? ?Hyperglycemia due to type 2 diabetes mellitus ([place]) ?Uncontrolled, A1c is 10.8% ?CBG this morning 310, lunch 179.   ?--Levemir 24 units in the morning ?--Increase bedtime Levemir to 30 units ?--Sliding scale NovoLog 0-15 ?--Mealtime NovoLog 2>>4 units ?--Adjust insulin as needed for goal 140-180 ?--Appreciate diabetes coordinator recommendations ? ?Anemia ?Likely due to chronic disease. ?Hemoglobin stable.  Monitor CBC. ? ?Nausea & vomiting ?Antiemetics PRN. ?Likely related to peritonitis, ESRD/uremia, medications. ? ? ? ? ?  ? ?Subjective: Patient was awake resting in bed when seen today.  She reports abdominal discomfort but overall her abdominal pain has improved since admission.  She declined to take oral potassium this morning, tells me yesterday she threw up right after taking it.  Currently nauseous since antibiotics were instilled in the peritoneum earlier this morning. ? ?Physical Exam: ?Vitals:  ? 02/18/22 2324 02/19/22 0400 02/19/22 0825 02/19/22 1111  ?BP: (!) 111/58 128/66 (!) 144/88 131/72  ?Pulse: 96 77 (!) 103 67  ?Resp: 14 15 18 18   ?Temp: 98.4 ?F (36.9 ?C) 98.2 ?F (36.8 ?C) 97.9 ?F (36.6 ?C) (!) 97.3 ?F (36.3 ?C)  ?TempSrc:      ?SpO2: 96% 96% 100% 99%  ?  Weight:      ?Height:      ? ?General exam: awake, alert, no acute distress, obese, chronically ill-appearing ?HEENT: moist mucus membranes, hearing grossly normal  ?Respiratory system: Coarse crackles  throughout, diminished bases, normal respiratory effort at rest, on 2 L/min [place] O2. ?Cardiovascular system: normal S1/S2, RRR, pitting edema bilateral lower extremities edema.   ?Gastrointestinal system: Distended, mildly tender, PD catheter placed. ?Central nervous system: A&O x3. no gross focal neurologic deficits, normal speech ?Extremities: moves all, no cyanosis, normal tone ?Skin: dry, intact, normal temperature ?Psychiatry: Depressed mood, congruent affect, judgement and insight appear normal ? ? ?Data Reviewed: ? ?Labs reviewed and notable for sodium 133, potassium 3.0, glucose 412, creatinine 4.22, calcium 8.1, GFR 10 ? ?Family Communication: Husband was at bedside during rounds ? ?Disposition: ?Status is: Inpatient ?Remains inpatient appropriate because: Remains on IV antibiotics pending cultures, persistent electrolyte abnormalities. ? ? Planned Discharge Destination: Home with Home Health ? ? ? ?Time spent: 35 minutes ? ?Author: ?Andrea Slocumb, DO ?02/19/2022 4:15 PM ? ?For on call review [email or web address].  ?

## 2022-02-19 NOTE — Progress Notes (Signed)
?[facility]  ?ROUNDING NOTE  ? ?Subjective:  ? ?Andrea Stewart is a 76 year old female with a past medical concerns including COPD, hypertension, depression, diabetes and end stage renal disease. Patient presents to ED with complaints vomiting, diarrhea and abdominal pain. She has been admitted for Shortness of breath [R06.02] ?Generalized abdominal pain [R10.84] ?Acute respiratory failure with hypoxia ([place]) [J96.01] ?Abdominal pain [R10.9] ?Hypervolemia, unspecified hypervolemia type [E87.70] ? ?She is known to our practice and receives outpatient follow-up with Dr. Juleen China.  Patient was recently admitted and discharged yesterday due to abdominal pain, increased swelling and shortness of breath.  Patient was suspected to have peritonitis and was discharged with an outpatient plan to follow-up with outpatient dialysis clinic to receive antibiotics with peritoneal dialysis treatments. ? ?Most recent PD fluid cell count was normal and both recent PD fluid cultures have been negative. ? ? ? ?Objective:  ?Vital signs in last 24 hours:  ?Temp:  [97.3 ?F (36.3 ?C)-98.4 ?F (36.9 ?C)] 97.5 ?F (36.4 ?C) (04/07 1619) ?Pulse Rate:  [57-103] 88 (04/07 1619) ?Resp:  [14-18] 18 (04/07 1619) ?BP: (111-144)/(58-88) 138/84 (04/07 1619) ?SpO2:  [96 %-100 %] 97 % (04/07 1619) ?Weight:  [90.7 kg] 90.7 kg (04/06 2021) ? ?Weight change: 2.6 kg ?Filed Weights  ? 02/17/22 1825 02/18/22 0500 02/18/22 2021  ?Weight: 88.1 kg 87.5 kg 90.7 kg  ? ? ?Intake/Output: ?I/O last 3 completed shifts: ?In: 20840 [P.O.:840; Other:85] ?Out: 21056 [Other:21056] ?  ?Intake/Output this shift: ? No intake/output data recorded. ? ?Physical Exam: ?General: NAD  ?Head: Normocephalic, atraumatic. Moist oral mucosal membranes  ?Eyes: Anicteric  ?Lungs:  Clear to auscultation, normal effort  ?Heart: Regular rate and rhythm  ?Abdomen:  Soft, tender, PD catheter  ?Extremities:  1+BLE, 1+ BUE peripheral edema.  ?Neurologic: Alert, moving all four  extremities  ?Skin: No lesions  ?Access: PD catheter  ? ? ?Basic Metabolic Panel: ?Recent Labs  ?Lab 02/15/22 ?2094 02/16/22 ?0401 02/17/22 ?7096 02/18/22 ?2836 02/19/22 ?0429  ?NA 138 137 134* 131* 133*  ?K 4.2 4.5 3.7 3.2* 3.0*  ?CL 100 101 99 94* 98  ?CO2 31 27 27 28 26   ?GLUCOSE 248* 263* 537* 585* 412*  ?BUN 20 28* 28* 24* 18  ?CREATININE 3.97* 5.24* 4.91* 4.55* 4.22*  ?CALCIUM 8.7* 8.4* 8.2* 8.2* 8.1*  ? ? ? ?Liver Function Tests: ?Recent Labs  ?Lab 02/14/22 ?2132 02/15/22 ?6294  ?AST 31 26  ?ALT 22 19  ?ALKPHOS 83 77  ?BILITOT 0.6 0.6  ?PROT 6.1* 5.8*  ?ALBUMIN 2.0* 2.0*  ? ? ?No results for input(s): LIPASE, AMYLASE in the last 168 hours. ?No results for input(s): AMMONIA in the last 168 hours. ? ?CBC: ?Recent Labs  ?Lab 02/14/22 ?2132 02/15/22 ?7654 02/15/22 ?1615 02/16/22 ?0401 02/17/22 ?6503  ?WBC 7.6 13.7* 8.6 7.6 6.1  ?NEUTROABS  --   --   --   --  3.4  ?HGB 10.4* 9.7* 8.8* 8.7* 8.5*  ?HCT 33.5* 31.1* 29.0* 28.7* 27.6*  ?MCV 98.0 99.4 99.0 100.3* 97.2  ?PLT 305 291 262 240 260  ? ? ? ?Cardiac Enzymes: ?No results for input(s): CKTOTAL, CKMB, CKMBINDEX, TROPONINI in the last 168 hours. ? ?BNP: ?Invalid input(s): POCBNP ? ?CBG: ?Recent Labs  ?Lab 02/19/22 ?0826 02/19/22 ?1129 02/19/22 ?1630 02/19/22 ?1648 02/19/22 ?1714  ?GLUCAP 310* 179* 39* 44* 76  ? ? ? ?Microbiology: ?Results for orders placed or performed during the hospital encounter of 02/14/22  ?Resp Panel by RT-PCR (Flu A&B, Covid) Nasopharyngeal Swab  Status: None  ? Collection Time: 02/14/22 10:07 PM  ? Specimen: Nasopharyngeal Swab; Nasopharyngeal(NP) swabs in vial transport medium  ?Result Value Ref Range Status  ? SARS Coronavirus 2 by RT PCR NEGATIVE NEGATIVE Final  ?  Comment: (NOTE) ?SARS-CoV-2 target nucleic acids are NOT DETECTED. ? ?The SARS-CoV-2 RNA is generally detectable in upper respiratory ?specimens during the acute phase of infection. The lowest ?concentration of SARS-CoV-2 viral copies this assay can detect is ?138  copies/mL. A negative result does not preclude SARS-Cov-2 ?infection and should not be used as the sole basis for treatment or ?other patient management decisions. A negative result may occur with  ?improper specimen collection/handling, submission of specimen other ?than nasopharyngeal swab, presence of viral mutation(s) within the ?areas targeted by this assay, and inadequate number of viral ?copies(<138 copies/mL). A negative result must be combined with ?clinical observations, patient history, and epidemiological ?information. The expected result is Negative. ? ?Fact Sheet for Patients:  ?EntrepreneurPulse.com.au ? ?Fact Sheet for Healthcare Providers:  ?IncredibleEmployment.be ? ?This test is no t yet approved or cleared by the [place] FDA and  ?has been authorized for detection and/or diagnosis of SARS-CoV-2 by ?FDA under an Emergency Use Authorization (EUA). This EUA will remain  ?in effect (meaning this test can be used) for the duration of the ?COVID-19 declaration under Section 564(b)(1) of the Act, 21 ?U.S.C.section 360bbb-3(b)(1), unless the authorization is terminated  ?or revoked sooner.  ? ? ?  ? Influenza A by PCR NEGATIVE NEGATIVE Final  ? Influenza B by PCR NEGATIVE NEGATIVE Final  ?  Comment: (NOTE) ?The Xpert Xpress SARS-CoV-2/FLU/RSV plus assay is intended as an aid ?in the diagnosis of influenza from Nasopharyngeal swab specimens and ?should not be used as a sole basis for treatment. Nasal washings and ?aspirates are unacceptable for Xpert Xpress SARS-CoV-2/FLU/RSV ?testing. ? ?Fact Sheet for Patients: ?EntrepreneurPulse.com.au ? ?Fact Sheet for Healthcare Providers: ?IncredibleEmployment.be ? ?This test is not yet approved or cleared by the [place] FDA and ?has been authorized for detection and/or diagnosis of SARS-CoV-2 by ?FDA under an Emergency Use Authorization (EUA). This EUA will remain ?in effect (meaning  this test can be used) for the duration of the ?COVID-19 declaration under Section 564(b)(1) of the Act, 21 U.S.C. ?section 360bbb-3(b)(1), unless the authorization is terminated or ?revoked. ? ?Performed at [facility], [place], ?[name] [address] ?  ?CULTURE, BLOOD (ROUTINE X 2) w Reflex to ID Panel     Status: None (Preliminary result)  ? Collection Time: 02/15/22  4:14 PM  ? Specimen: BLOOD  ?Result Value Ref Range Status  ? Specimen Description BLOOD RW  Final  ? Special Requests BOTTLES DRAWN AEROBIC AND ANAEROBIC BCAV  Final  ? Culture   Final  ?  NO GROWTH 4 DAYS ?Performed at [facility], [address], [place], [place] [address] ?  ? Report Status PENDING  Incomplete  ?CULTURE, BLOOD (ROUTINE X 2) w Reflex to ID Panel     Status: None (Preliminary result)  ? Collection Time: 02/15/22  4:15 PM  ? Specimen: BLOOD  ?Result Value Ref Range Status  ? Specimen Description BLOOD [facility]  Final  ? Special Requests BOTTLES DRAWN AEROBIC AND ANAEROBIC BCAV  Final  ? Culture   Final  ?  NO GROWTH 4 DAYS ?Performed at [facility], [address] [place],  [address] ?  ? Report Status PENDING  Incomplete  ?Body fluid culture w Gram Stain  Status: None (Preliminary result)  ? Collection Time: 02/16/22  8:25 PM  ? Specimen: Peritoneal Washings; Body Fluid  ?Result Value Ref Range Status  ? Specimen Description   Final  ?  PERITONEAL ?Performed at [facility], [address], [place], [place] [address] ?  ? Special Requests   Final  ?  NONE ?Performed at [facility], [address], [place], [place] [address] ?  ? Gram Stain   Final  ?  FEW WBC PRESENT, PREDOMINANTLY PMN ?RARE GRAM POSITIVE COCCI IN CHAINS ?  ? Culture   Final  ?  NO GROWTH 3 DAYS ?Performed at [facility], [place] [address] [place], [place] [address] ?  ? Report Status PENDING  Incomplete  ?MRSA Next Gen by PCR, Nasal     Status: None  ? Collection Time: 02/17/22 12:14 PM   ? Specimen: Nasal Mucosa; Nasal Swab  ?Result Value Ref Range Status  ? MRSA by PCR Next Gen NOT DETECTED NOT DETECTED Final  ?  Comment: (NOTE) ?The GeneXpert MRSA Assay (FDA approved for NASAL specimens only

## 2022-02-20 DIAGNOSIS — E162 Hypoglycemia, unspecified: Secondary | ICD-10-CM | POA: Diagnosis not present

## 2022-02-20 LAB — CULTURE, BLOOD (ROUTINE X 2)
Culture: NO GROWTH
Culture: NO GROWTH

## 2022-02-20 LAB — BODY FLUID CULTURE W GRAM STAIN: Culture: NO GROWTH

## 2022-02-20 LAB — GLUCOSE, CAPILLARY
Glucose-Capillary: 377 mg/dL — ABNORMAL HIGH (ref 70–99)
Glucose-Capillary: 211 mg/dL — ABNORMAL HIGH (ref 70–99)
Glucose-Capillary: 91 mg/dL (ref 70–99)
Glucose-Capillary: 284 mg/dL — ABNORMAL HIGH (ref 70–99)

## 2022-02-20 LAB — CBC
HCT: 32.5 % — ABNORMAL LOW (ref 36.0–46.0)
Hemoglobin: 10.3 g/dL — ABNORMAL LOW (ref 12.0–15.0)
MCH: 30.5 pg (ref 26.0–34.0)
MCHC: 31.7 g/dL (ref 30.0–36.0)
MCV: 96.2 fL (ref 80.0–100.0)
Platelets: 333 10*3/uL (ref 150–400)
RBC: 3.38 MIL/uL — ABNORMAL LOW (ref 3.87–5.11)
RDW: 14 % (ref 11.5–15.5)
WBC: 6 10*3/uL (ref 4.0–10.5)
nRBC: 0 % (ref 0.0–0.2)

## 2022-02-20 NOTE — Progress Notes (Signed)
[facility]  ROUNDING NOTE   Subjective:   Andrea Stewart is a 76 year old female with a past medical concerns including COPD, hypertension, depression, diabetes and end stage renal disease. Patient presents to ED with complaints vomiting, diarrhea and abdominal pain. She has been admitted for Shortness of breath [R06.02] Generalized abdominal pain [R10.84] Acute respiratory failure with hypoxia ([facility]) [J96.01] Abdominal pain [R10.9] Hypervolemia, unspecified hypervolemia type [E87.70]  She is known to our practice and receives outpatient follow-up with Dr. Wynelle Link.   Patient was recently admitted and discharged yesterday due to abdominal pain, increased swelling and shortness of breath.   Patient was suspected to have peritonitis and was discharged with an outpatient plan to follow-up with outpatient dialysis clinic to receive antibiotics with peritoneal dialysis treatments.  Most recent PD fluid cell count was normal and both recent PD fluid cultures have been negative.Patient was seen today on the second floor Patient main concern in today visit was how was Dr. Wynelle Link doing. Patient had no new specific physical complaints Patient next major concern was if she can go home today I then educated patient about need to arrange her outpatient antibiotics before the discharge     Objective:  Vital signs in last 24 hours:  Temp:  [97.3 F (36.3 C)-97.9 F (36.6 C)] 97.9 F (36.6 C) (04/08 0329) Pulse Rate:  [67-103] 67 (04/08 0329) Resp:  [18] 18 (04/08 0329) BP: (127-144)/(52-114) 139/114 (04/08 0329) SpO2:  [94 %-100 %] 94 % (04/08 0329) Weight:  [88.6 kg] 88.6 kg (04/07 2221)  Weight change: -2.1 kg Filed Weights   02/18/22 0500 02/18/22 2021 02/19/22 2221  Weight: 87.5 kg 90.7 kg 88.6 kg    Intake/Output: I/O last 3 completed shifts: In: 40981 [P.O.:720; Other:10000] Out: 19147 [Other:10481]   Intake/Output this shift:  No intake/output data  recorded.  Physical Exam: General: NAD  Head: Normocephalic, atraumatic. Moist oral mucosal membranes  Eyes: Anicteric  Lungs:  Clear to auscultation, normal effort  Heart: Regular rate and rhythm  Abdomen:  Soft, tender, PD catheter  Extremities:  1+BLE, 1+ BUE peripheral edema.  Neurologic: Alert, moving all four extremities  Skin: No lesions  Access: PD catheter    Basic Metabolic Panel: Recent Labs  Lab 02/15/22 0514 02/16/22 0401 02/17/22 0557 02/18/22 0536 02/19/22 0429  NA 138 137 134* 131* 133*  K 4.2 4.5 3.7 3.2* 3.0*  CL 100 101 99 94* 98  CO2 31 27 27 28 26   GLUCOSE 248* 263* 537* 585* 412*  BUN 20 28* 28* 24* 18  CREATININE 3.97* 5.24* 4.91* 4.55* 4.22*  CALCIUM 8.7* 8.4* 8.2* 8.2* 8.1*    Liver Function Tests: Recent Labs  Lab 02/14/22 2132 02/15/22 0514  AST 31 26  ALT 22 19  ALKPHOS 83 77  BILITOT 0.6 0.6  PROT 6.1* 5.8*  ALBUMIN 2.0* 2.0*   No results for input(s): LIPASE, AMYLASE in the last 168 hours. No results for input(s): AMMONIA in the last 168 hours.  CBC: Recent Labs  Lab 02/15/22 0514 02/15/22 1615 02/16/22 0401 02/17/22 0557 02/20/22 0542  WBC 13.7* 8.6 7.6 6.1 6.0  NEUTROABS  --   --   --  3.4  --   HGB 9.7* 8.8* 8.7* 8.5* 10.3*  HCT 31.1* 29.0* 28.7* 27.6* 32.5*  MCV 99.4 99.0 100.3* 97.2 96.2  PLT 291 262 240 260 333    Cardiac Enzymes: No results for input(s): CKTOTAL, CKMB, CKMBINDEX, TROPONINI in the last 168 hours.  BNP: Invalid input(s): POCBNP  CBG: Recent Labs  Lab 02/19/22 1129 02/19/22 1630 02/19/22 1648 02/19/22 1714 02/19/22 2100  GLUCAP 179* 39* 44* 76 135*    Microbiology: Results for orders placed or performed during the hospital encounter of 02/14/22  Resp Panel by RT-PCR (Flu A&B, Covid) Nasopharyngeal Swab     Status: None   Collection Time: 02/14/22 10:07 PM   Specimen: Nasopharyngeal Swab; Nasopharyngeal(NP) swabs in vial transport medium  Result Value Ref Range Status   SARS  Coronavirus 2 by RT PCR NEGATIVE NEGATIVE Final    Comment: (NOTE) SARS-CoV-2 target nucleic acids are NOT DETECTED.  The SARS-CoV-2 RNA is generally detectable in upper respiratory specimens during the acute phase of infection. The lowest concentration of SARS-CoV-2 viral copies this assay can detect is 138 copies/mL. A negative result does not preclude SARS-Cov-2 infection and should not be used as the sole basis for treatment or other patient management decisions. A negative result may occur with  improper specimen collection/handling, submission of specimen other than nasopharyngeal swab, presence of viral mutation(s) within the areas targeted by this assay, and inadequate number of viral copies(<138 copies/mL). A negative result must be combined with clinical observations, patient history, and epidemiological information. The expected result is Negative.  Fact Sheet for Patients:  BloggerCourse.com  Fact Sheet for Healthcare Providers:  SeriousBroker.it  This test is no t yet approved or cleared by the [place] FDA and  has been authorized for detection and/or diagnosis of SARS-CoV-2 by FDA under an Emergency Use Authorization (EUA). This EUA will remain  in effect (meaning this test can be used) for the duration of the COVID-19 declaration under Section 564(b)(1) of the Act, 21 U.S.C.section 360bbb-3(b)(1), unless the authorization is terminated  or revoked sooner.       Influenza A by PCR NEGATIVE NEGATIVE Final   Influenza B by PCR NEGATIVE NEGATIVE Final    Comment: (NOTE) The Xpert Xpress SARS-CoV-2/FLU/RSV plus assay is intended as an aid in the diagnosis of influenza from Nasopharyngeal swab specimens and should not be used as a sole basis for treatment. Nasal washings and aspirates are unacceptable for Xpert Xpress SARS-CoV-2/FLU/RSV testing.  Fact Sheet for  Patients: BloggerCourse.com  Fact Sheet for Healthcare Providers: SeriousBroker.it  This test is not yet approved or cleared by the [place] FDA and has been authorized for detection and/or diagnosis of SARS-CoV-2 by FDA under an Emergency Use Authorization (EUA). This EUA will remain in effect (meaning this test can be used) for the duration of the COVID-19 declaration under Section 564(b)(1) of the Act, 21 U.S.C. section 360bbb-3(b)(1), unless the authorization is terminated or revoked.  Performed at [facility], [address] [place], [place] [address]   CULTURE, BLOOD (ROUTINE X 2) w Reflex to ID Panel     Status: None (Preliminary result)   Collection Time: 02/15/22  4:14 PM   Specimen: BLOOD  Result Value Ref Range Status   Specimen Description BLOOD RW  Final   Special Requests BOTTLES DRAWN AEROBIC AND ANAEROBIC BCAV  Final   Culture   Final    NO GROWTH 4 DAYS Performed at [facility], [address] [place], [place] [address]    Report Status PENDING  Incomplete  CULTURE, BLOOD (ROUTINE X 2) w Reflex to ID Panel     Status: None (Preliminary result)   Collection Time: 02/15/22  4:15 PM   Specimen: BLOOD  Result Value Ref Range Status   Specimen Description BLOOD [facility]  Final  Special Requests BOTTLES DRAWN AEROBIC AND ANAEROBIC BCAV  Final   Culture   Final    NO GROWTH 4 DAYS Performed at [facility], [address] [place], [place] [address]    Report Status PENDING  Incomplete  Body fluid culture w Gram Stain     Status: None (Preliminary result)   Collection Time: 02/16/22  8:25 PM   Specimen: Peritoneal Washings; Body Fluid  Result Value Ref Range Status   Specimen Description   Final    PERITONEAL Performed at [facility], [address] [place], [place] [address]    Special Requests   Final    NONE Performed at [facility], [address] [place], [place] [address]    Gram Stain   Final    FEW WBC PRESENT, PREDOMINANTLY PMN RARE GRAM POSITIVE COCCI IN CHAINS    Culture   Final    NO GROWTH 3 DAYS Performed at [facility], [address] [place], [place] [address]    Report Status PENDING  Incomplete  MRSA Next Gen by PCR, Nasal     Status: None   Collection Time: 02/17/22 12:14 PM   Specimen: Nasal Mucosa; Nasal Swab  Result Value Ref Range Status   MRSA by PCR Next Gen NOT DETECTED NOT DETECTED Final    Comment: (NOTE) The GeneXpert MRSA Assay (FDA approved for NASAL specimens only), is one component of a comprehensive MRSA colonization surveillance program. It is not intended to diagnose MRSA infection nor to guide or monitor treatment for MRSA infections. Test performance is not FDA approved in patients less than 59 years old. Performed at [facility], [address] [place], [place] [address]   Body fluid culture w Gram Stain     Status: None (Preliminary result)   Collection Time: 02/18/22 11:55 AM   Specimen: Peritoneal Washings; Body Fluid  Result Value Ref Range Status   Specimen Description   Final    PERITONEAL Performed at [facility], [address] [place], [place] [address]    Special Requests   Final    NONE Performed at [facility], [address] [place], [place] [address]    Gram Stain   Final    FEW WBC PRESENT,BOTH PMN AND MONONUCLEAR NO ORGANISMS SEEN    Culture   Final    NO GROWTH < 24 HOURS Performed at [facility], [address] [place], [place] [address]    Report Status PENDING  Incomplete    Coagulation Studies: No results for input(s): LABPROT, INR in the last 72 hours.  Urinalysis: No results for input(s): COLORURINE, LABSPEC, PHURINE, GLUCOSEU, HGBUR, BILIRUBINUR, KETONESUR, PROTEINUR, UROBILINOGEN, NITRITE, LEUKOCYTESUR in the last 72 hours.  Invalid input(s): APPERANCEUR    Imaging: No results  found.   Medications:    dianeal solution for CAPD/CCPD with additives     dialysis solution 4.25% low-MG/low-CA      ALPRAZolam  0.5 mg Oral QHS   apixaban  5 mg Oral BID   aspirin EC  81 mg Oral Daily   atorvastatin  20 mg Oral Daily   epoetin (EPOGEN/PROCRIT) injection  20,000 Units Subcutaneous Weekly   furosemide  80 mg Intravenous Daily   gentamicin cream  1 application. Topical Daily   insulin aspart  0-15 Units Subcutaneous TID WC   insulin aspart  4 Units Subcutaneous TID WC   insulin detemir  24 Units Subcutaneous Daily   insulin  detemir  30 Units Subcutaneous QHS   metoprolol succinate  25 mg Oral Daily   mometasone-formoterol  2 puff Inhalation BID   pantoprazole  40 mg Oral BID   vancomycin variable dose per unstable renal function (pharmacist dosing)   Does not apply See admin instructions     Assessment/ Plan:  Andrea Stewart is a 76 y.o.  female with a past medical concerns including COPD, hypertension, depression, diabetes and end stage renal disease. Patient presents to ED with complaints vomiting, diarrhea and abdominal pain. She has been admitted for Shortness of breath [R06.02] Generalized abdominal pain [R10.84] Acute respiratory failure with hypoxia ([facility]) [J96.01] Abdominal pain [R10.9] Hypervolemia, unspecified hypervolemia type [E87.70]  [facility] PD: 5 Cycles/ 2L fills/9 hours/ 10/20/0 last fill  End stage renal disease on peritoneal dialysis with suspicion of peritonitis. Recently received antibiotic therapy for 2 weeks prior to previous admission. Will continue scheduled antibiotic therapy. Vancomycin given IV in ED.  -Suspected that the PD fluid cell count performed on 02/16/2022 was erroneous.  We repeated PD fluid cell count on 02/20/2022 which showed total nucleated cell count of 265.  PD fluid cultures from both 4/4 and 4/6 are negative.  She is still having some abdominal pain however.  Continue vancomycin and ceftazidime for another 10 days at this  time.  2. Anemia of chronic kidney disease  Normocytic  Lab Results  Component Value Date   HGB 10.3 (L) 02/20/2022    She will need ongoing erythropoietin stimulating agents as an outpatient.  3. Secondary Hyperparathyroidism: Lab Results  Component Value Date   CALCIUM 8.1 (L) 02/19/2022   Will continue to monitor bone minerals during this admission.   4. Diabetes mellitus type II with chronic kidney disease: insulin dependent. Home regimen includes NPH. Most recent hemoglobin A1c is 11.2 on 02/09/22.  Primary team to manage SSI.    LOS: 5 Gaile Allmon [facility] 4/8/20237:19 AM

## 2022-02-20 NOTE — Progress Notes (Signed)
?Progress Note ? ? ?Patient: Andrea Stewart ZOX:[id number] DOB: [birth year] DOA: 02/14/2022     5 ?DOS: the patient was seen and examined on 02/20/2022 ?  ?Brief hospital course: ?Andrea Stewart is a 76 y.o. female with medical history significant for recent discharge from the hospital on 02/14/2022 after hospitalization for atrial fibrillation with RVR and abdominal pain concerning for peritonitis, ESRD on peritoneal dialysis, COPD, morbid obesity, hypertension, anemia of chronic disease, type 2 diabetes mellitus, who was brought to the hospital because of abdominal pain and shortness of breath.  She initially required 4 L/min supplemental oxygen. ? ?Admitted to the hospital.  Started on empiric antibiotics pending peritoneal fluid cultures.  Nephrology consulted and recommending consideration to transition from PD to hemodialysis but patient declines to do so.  Also being treated with IV Lasix for ongoing edema and dyspnea. ? ?Patient does not want to pursue hemodialysis, if PD were to stop working well enough to remove adequate fluid.   ?Palliative Care consulted.  ? ? ? ?Assessment and Plan: ?* Abdominal pain ?Improved since admission, not fully resolved. ?Suspect peritonitis given peritoneal dialysis.  Patient was recently discharged with same but returned due to refractory symptoms at home.  Reportedly had rebound tenderness on admission. ?-- Continue empiric IV antibiotics, duration per nephrology ?-- Follow peritoneal fluid cultures ?-- Supportive care ?-- Nephrology following for PD ? ? ? ? ?Acute respiratory failure with hypoxia ([place]) ?Most likely due to volume overload in the setting of ESRD.  Nephrology following for dialysis.  Also on IV Lasix.  Initial O2 requirement 4 L/min, this morning 2 L/min. ?-- Supplement O2 to maintain sats above 90%, wean as tolerated ?-- Other management as outlined ? ?SOB (shortness of breath) ?Due to volume overload in the setting of ESRD.  On IV Lasix and peritoneal dialysis  does to be resumed. ?Nephrology consult.  ?Supplemental oxygen as needed. ?  ? ? ?End stage renal disease ([place]) ?On peritoneal dialysis, presenting with peritonitis.  Nephrology following. ?Discussions underway regarding transition to hemodialysis.  Patient has been declining this, continuing to discuss with husband.  She cites her brother's experience with hemodialysis was miserable, does not want to put herself through that. ?-- See nephrology recommendations ?-- Continue PD ? ? ? ?Essential hypertension ?BP is overall controlled. ?--Currently on IV Lasix per nephrology ? ?Hyperglycemia due to type 2 diabetes mellitus ([place]) ?Uncontrolled, A1c is 10.8% ?CBG this morning 310, lunch 179.   ?--Levemir 24 units in the morning ?--Levemir to 30 units nightly ?--Sliding scale NovoLog 0-15 ?--Mealtime NovoLog 4 units ?--Adjust insulin as needed for goal 140-180 ?--Appreciate diabetes coordinator recommendations ? ?Anemia ?Likely due to chronic disease. ?Hemoglobin stable.  Monitor CBC. ? ?Hypoglycemia ?4/6 afternoon CBG dropped to 39, due to poor PO intake with N/V. ?--Hypoglycemia protocol ?--Would give 1/2 dose basal insulin if HS CBG still low, since her AM glucose has been 300's-400. ? ?Nausea & vomiting ?Antiemetics PRN. ?Likely related to peritonitis, ESRD/uremia, medications. ? ? ? ? ?  ? ?Subjective: Patient awake resting in bed, husband in recliner and they are holding hands.  She reports having loose stools.  Otherwise she feels better today without nausea or vomiting.  States antibiotics were just instilled her abdomen, previous days this is made her feel nauseous.  So far she feels okay today.  No other acute complaints including fevers or chills.  We discussed her elevated blood glucose in the mornings which she states also happens at home secondary to the diastole fluid  in her abdomen overnight for PD. ? ?Physical Exam: ?Vitals:  ? 02/19/22 2328 02/20/22 0329 02/20/22 0742 02/20/22 1209  ?BP: (!) 127/52 (!)  139/114 (!) 124/49 121/86  ?Pulse: 83 67 98 89  ?Resp: 18 18 17 18   ?Temp: 97.7 ?F (36.5 ?C) 97.9 ?F (36.6 ?C) 97.9 ?F (36.6 ?C) 97.8 ?F (36.6 ?C)  ?TempSrc:   Oral Oral  ?SpO2: 96% 94% 96% 99%  ?Weight:      ?Height:      ? ?General exam: awake, alert, no acute distress ?HEENT: moist mucus membranes, hearing grossly normal  ?Respiratory system: normal respiratory effort, on room air. ?Cardiovascular system: RRR, bilateral lower extremity pitting edema.   ?Gastrointestinal system: Mildly distended but nontender, PD catheter present. ?Central nervous system: A&O x4. no gross focal neurologic deficits, normal speech ?Extremities: moves all, no cyanosis, normal tone ?Skin: dry, intact, normal temperature ?Psychiatry: normal mood, congruent affect, judgement and insight appear normal ? ? ?Data Reviewed: ? ?Labs reviewed notable for hemoglobin 10.3 stable.  CBGs today 377, 211.  Bedtime CBG last night was 135, improved from her hypoglycemic episode in the afternoon. ? ?Family Communication: husband at bedside on rounds ? ?Disposition: ?Status is: Inpatient ?Remains inpatient appropriate because: remains on intraperitoneal antibiotics.  Discharge pending clearance by nephrology and plan in place for intraperitoneal antibiotics at home if possible. ? ? Planned Discharge Destination: Home with Home Health ? ? ? ?Time spent: 35 minutes ? ?Author: ?Ezekiel Slocumb, DO ?02/20/2022 3:32 PM ? ?For on call review [email or web address].  ?

## 2022-02-20 NOTE — Assessment & Plan Note (Addendum)
4/6 afternoon CBG dropped to 39, due to poor PO intake with N/V. ?--Hypoglycemia protocol ?--Improved once PO intake improved, but generally patient's appetite declined over course of admission ?--Monitor closely ?

## 2022-02-20 NOTE — Consult Note (Signed)
Pharmacy Antibiotic Note ? ?Andrea Stewart is a 76 y.o. female w/  h/o ESRD-PD, COPD, morbid obesity to ED c/o SOB and CC of abd'l pain admitted on 02/14/2022 with  intra-abdominal infection  (suspected peritonitis ).  Pharmacy has been consulted for Vancomycin & ceftazidime dosing. ? ?Plan: ?Ceftazidime 1.5g q24h in PD fluid.  ?4/7 @0429 : Vancomycin random 34.  Plan to maintain the vancomycin level between 15-20. Recommend ordering vancomycin level on Monday (4/10) ? ? ?Height: 4\' 11"  (149.9 cm) ?Weight: 88.6 kg (195 lb 5.2 oz) ?IBW/kg (Calculated) : 43.2 ? ?Temp (24hrs), Avg:97.7 ?F (36.5 ?C), Min:97.3 ?F (36.3 ?C), Max:97.9 ?F (36.6 ?C) ? ?Recent Labs  ?Lab 02/14/22 ?2132 02/15/22 ?9470 02/15/22 ?1615 02/16/22 ?0401 02/17/22 ?9628 02/18/22 ?3662 02/19/22 ?0429 02/20/22 ?0542  ?WBC 7.6 13.7* 8.6 7.6 6.1  --   --  6.0  ?CREATININE 3.78* 3.97*  --  5.24* 4.91* 4.55* 4.22*  --   ?LATICACIDVEN 2.2* 1.0  --   --   --   --   --   --   ?VANCORANDOM  --   --   --   --   --  25 34  --   ? ?  ?Estimated Creatinine Clearance: 11 mL/min (A) (by C-G formula based on SCr of 4.22 mg/dL (H)).   ? ?No Known Allergies ? ?Antimicrobials this admission: ?Vancomycin / Ceftazidime (4/03 >>  ? ?Dose adjustments this admission: ?ESRD-PD -- CTM based on levels at 3-5d for Vanco dosing. ? ?Microbiology results: ?4/03 BCx: NG x 5 days ?4/03 Flu/Cov: negative  ?4/03 MRSA PCR: negative  ?4/4 peritoneal cx: rare GPC ?4/6 peritoneal: pending ? ?Thank you for allowing pharmacy to be a part of this patient?s care. ? ?Chinita Greenland PharmD ?Clinical Pharmacist ?02/20/2022 ? ? ? ?

## 2022-02-20 NOTE — Progress Notes (Signed)
Patient scheduled for 9 hour PD treatment with 5 exchanges. Dressing changed, no concern for infection at site. Initial drain without alarms, patient alert, offers up no concerns, family at bedside.  ?

## 2022-02-21 DIAGNOSIS — E876 Hypokalemia: Secondary | ICD-10-CM | POA: Diagnosis not present

## 2022-02-21 LAB — BODY FLUID CULTURE W GRAM STAIN: Culture: NO GROWTH

## 2022-02-21 LAB — BASIC METABOLIC PANEL
Anion gap: 5 (ref 5–15)
BUN: 14 mg/dL (ref 8–23)
CO2: 30 mmol/L (ref 22–32)
Calcium: 8.1 mg/dL — ABNORMAL LOW (ref 8.9–10.3)
Chloride: 98 mmol/L (ref 98–111)
Creatinine, Ser: 4.47 mg/dL — ABNORMAL HIGH (ref 0.44–1.00)
GFR, Estimated: 10 mL/min — ABNORMAL LOW (ref >60)
Glucose, Bld: 198 mg/dL — ABNORMAL HIGH (ref 70–99)
Potassium: 2.5 mmol/L — CL (ref 3.5–5.1)
Sodium: 133 mmol/L — ABNORMAL LOW (ref 135–145)

## 2022-02-21 LAB — MAGNESIUM: Magnesium: 1.3 mg/dL — ABNORMAL LOW (ref 1.7–2.4)

## 2022-02-21 LAB — GLUCOSE, CAPILLARY
Glucose-Capillary: 121 mg/dL — ABNORMAL HIGH (ref 70–99)
Glucose-Capillary: 137 mg/dL — ABNORMAL HIGH (ref 70–99)
Glucose-Capillary: 152 mg/dL — ABNORMAL HIGH (ref 70–99)
Glucose-Capillary: 205 mg/dL — ABNORMAL HIGH (ref 70–99)

## 2022-02-21 MED ORDER — HYDROMORPHONE HCL 1 MG/ML IJ SOLN
0.5000 mg | Freq: Once | INTRAMUSCULAR | Status: AC
  Administered 2022-02-21: 0.2 mg via INTRAVENOUS
  Filled 2022-02-21: qty 1

## 2022-02-21 MED ORDER — DELFLEX-LC/2.5% DEXTROSE 394 MOSM/L IP SOLN
INTRAPERITONEAL | Status: DC
Start: 2022-02-21 — End: 2022-02-22
  Filled 2022-02-21: qty 3000

## 2022-02-21 MED ORDER — POTASSIUM CHLORIDE 20 MEQ PO PACK: 20.0000 meq | PACK | Freq: Three times a day (TID) | ORAL | Status: DC

## 2022-02-21 MED ORDER — POTASSIUM CHLORIDE 20 MEQ PO PACK: 20.0000 meq | PACK | Freq: Once | ORAL | Status: DC

## 2022-02-21 MED ORDER — POTASSIUM CHLORIDE CRYS ER 20 MEQ PO TBCR
40.0000 meq | EXTENDED_RELEASE_TABLET | ORAL | Status: AC
  Administered 2022-02-21 (×2): 40 meq via ORAL
  Filled 2022-02-21 (×2): qty 2

## 2022-02-21 MED ORDER — DELFLEX-LC/1.5% DEXTROSE 344 MOSM/L IP SOLN
Freq: Once | INTRAVENOUS_CENTRAL | Status: AC
  Filled 2022-02-21: qty 0

## 2022-02-21 MED ORDER — MAGNESIUM SULFATE 4 GM/100ML IV SOLN
4.0000 g | Freq: Once | INTRAVENOUS | Status: AC
  Administered 2022-02-21: 4 g via INTRAVENOUS
  Filled 2022-02-21: qty 100

## 2022-02-21 MED ORDER — POTASSIUM CHLORIDE 10 MEQ/100ML IV SOLN
10.0000 meq | INTRAVENOUS | Status: AC
  Administered 2022-02-21: 10 meq via INTRAVENOUS
  Filled 2022-02-21 (×2): qty 100

## 2022-02-21 MED ORDER — POTASSIUM CHLORIDE 20 MEQ PO PACK
20.0000 meq | PACK | Freq: Two times a day (BID) | ORAL | Status: DC
  Filled 2022-02-21: qty 1

## 2022-02-21 NOTE — Progress Notes (Signed)
[facility]  ROUNDING NOTE   Subjective:   Andrea Stewart is a 76 year old female with a past medical concerns including COPD, hypertension, depression, diabetes and end stage renal disease. Patient presents to ED with complaints vomiting, diarrhea and abdominal pain. She has been admitted for Shortness of breath [R06.02] Generalized abdominal pain [R10.84] Acute respiratory failure with hypoxia (HCC) [J96.01] Abdominal pain [R10.9] Hypervolemia, unspecified hypervolemia type [E87.70]  She is known to our practice and receives outpatient follow-up with Dr. Wynelle Link.   Patient was recently admitted and discharged yesterday due to abdominal pain, increased swelling and shortness of breath.   Patient was suspected to have peritonitis and was discharged with an outpatient plan to follow-up with outpatient dialysis clinic to receive antibiotics with peritoneal dialysis treatments.  Most recent PD fluid cell count was normal and both recent PD fluid cultures have been negative.Patient was seen today on the second floor Patient main complaint in today visit was that she does not live taking oral potassium.  I then discussed with the patient that in this morning labs she had low potassiums I was trying to replace the electrolyte. Patient family was present in the room as well. Patient's next major concern was if everything will be ruled out for her to go home tomorrow.  Patient later became emotional that she wants to go home I then educated patient about need to arrange her outpatient antibiotics before the discharge Patient and the family voiced understanding    Objective:  Vital signs in last 24 hours:  Temp:  [97.6 F (36.4 C)-97.9 F (36.6 C)] 97.8 F (36.6 C) (04/09 0837) Pulse Rate:  [60-109] 109 (04/09 0837) Resp:  [16-18] 18 (04/09 0837) BP: (119-147)/(58-118) 139/73 (04/09 0837) SpO2:  [92 %-100 %] 96 % (04/09 0837) Weight:  [87.4 kg] 87.4 kg (04/09 0500)  Weight  change: -1.2 kg Filed Weights   02/18/22 2021 02/19/22 2221 02/21/22 0500  Weight: 90.7 kg 88.6 kg 87.4 kg    Intake/Output: I/O last 3 completed shifts: In: [address] [P.O.:680; Other:10000] Out: 10716 [Other:10716]   Intake/Output this shift:  No intake/output data recorded.  Physical Exam: General: NAD  Head: Normocephalic, atraumatic. Moist oral mucosal membranes  Eyes: Anicteric  Lungs:  Clear to auscultation, normal effort  Heart: Regular rate and rhythm  Abdomen:  Soft, tender, PD catheter  Extremities:  2+BLE, 2+ BUE peripheral edema.  Neurologic: Alert, moving all four extremities  Skin: No lesions  Access: PD catheter    Basic Metabolic Panel: Recent Labs  Lab 02/16/22 0401 02/17/22 0557 02/18/22 0536 02/19/22 0429 02/21/22 0644  NA 137 134* 131* 133* 133*  K 4.5 3.7 3.2* 3.0* 2.5*  CL 101 99 94* 98 98  CO2 27 27 28 26 30   GLUCOSE 263* 537* 585* 412* 198*  BUN 28* 28* 24* 18 14  CREATININE 5.24* 4.91* 4.55* 4.22* 4.47*  CALCIUM 8.4* 8.2* 8.2* 8.1* 8.1*  MG  --   --   --   --  1.3*    Liver Function Tests: Recent Labs  Lab 02/14/22 2132 02/15/22 0514  AST 31 26  ALT 22 19  ALKPHOS 83 77  BILITOT 0.6 0.6  PROT 6.1* 5.8*  ALBUMIN 2.0* 2.0*   No results for input(s): LIPASE, AMYLASE in the last 168 hours. No results for input(s): AMMONIA in the last 168 hours.  CBC: Recent Labs  Lab 02/15/22 0514 02/15/22 1615 02/16/22 0401 02/17/22 0557 02/20/22 0542  WBC 13.7* 8.6 7.6 6.1 6.0  NEUTROABS  --   --   --  3.4  --   HGB 9.7* 8.8* 8.7* 8.5* 10.3*  HCT 31.1* 29.0* 28.7* 27.6* 32.5*  MCV 99.4 99.0 100.3* 97.2 96.2  PLT 291 262 240 260 333    Cardiac Enzymes: No results for input(s): CKTOTAL, CKMB, CKMBINDEX, TROPONINI in the last 168 hours.  BNP: Invalid input(s): POCBNP  CBG: Recent Labs  Lab 02/20/22 0741 02/20/22 1205 02/20/22 1616 02/20/22 2022 02/21/22 0847  GLUCAP 377* 211* 91 284* 121*    Microbiology: Results for  orders placed or performed during the hospital encounter of 02/14/22  Resp Panel by RT-PCR (Flu A&B, Covid) Nasopharyngeal Swab     Status: None   Collection Time: 02/14/22 10:07 PM   Specimen: Nasopharyngeal Swab; Nasopharyngeal(NP) swabs in vial transport medium  Result Value Ref Range Status   SARS Coronavirus 2 by RT PCR NEGATIVE NEGATIVE Final    Comment: (NOTE) SARS-CoV-2 target nucleic acids are NOT DETECTED.  The SARS-CoV-2 RNA is generally detectable in upper respiratory specimens during the acute phase of infection. The lowest concentration of SARS-CoV-2 viral copies this assay can detect is 138 copies/mL. A negative result does not preclude SARS-Cov-2 infection and should not be used as the sole basis for treatment or other patient management decisions. A negative result may occur with  improper specimen collection/handling, submission of specimen other than nasopharyngeal swab, presence of viral mutation(s) within the areas targeted by this assay, and inadequate number of viral copies(<138 copies/mL). A negative result must be combined with clinical observations, patient history, and epidemiological information. The expected result is Negative.  Fact Sheet for Patients:  BloggerCourse.com  Fact Sheet for Healthcare Providers:  SeriousBroker.it  This test is no t yet approved or cleared by the [place] FDA and  has been authorized for detection and/or diagnosis of SARS-CoV-2 by FDA under an Emergency Use Authorization (EUA). This EUA will remain  in effect (meaning this test can be used) for the duration of the COVID-19 declaration under Section 564(b)(1) of the Act, 21 U.S.C.section 360bbb-3(b)(1), unless the authorization is terminated  or revoked sooner.       Influenza A by PCR NEGATIVE NEGATIVE Final   Influenza B by PCR NEGATIVE NEGATIVE Final    Comment: (NOTE) The Xpert Xpress SARS-CoV-2/FLU/RSV plus  assay is intended as an aid in the diagnosis of influenza from Nasopharyngeal swab specimens and should not be used as a sole basis for treatment. Nasal washings and aspirates are unacceptable for Xpert Xpress SARS-CoV-2/FLU/RSV testing.  Fact Sheet for Patients: BloggerCourse.com  Fact Sheet for Healthcare Providers: SeriousBroker.it  This test is not yet approved or cleared by the [place] FDA and has been authorized for detection and/or diagnosis of SARS-CoV-2 by FDA under an Emergency Use Authorization (EUA). This EUA will remain in effect (meaning this test can be used) for the duration of the COVID-19 declaration under Section 564(b)(1) of the Act, 21 U.S.C. section 360bbb-3(b)(1), unless the authorization is terminated or revoked.  Performed at [facility], [address] [place], [place] [address]   CULTURE, BLOOD (ROUTINE X 2) w Reflex to ID Panel     Status: None   Collection Time: 02/15/22  4:14 PM   Specimen: BLOOD  Result Value Ref Range Status   Specimen Description BLOOD RW  Final   Special Requests BOTTLES DRAWN AEROBIC AND ANAEROBIC BCAV  Final   Culture   Final    NO GROWTH 5 DAYS Performed at [facility], [address] [place], [place] [address]    Report Status 02/20/2022 FINAL  Final  CULTURE, BLOOD (ROUTINE X 2) w Reflex to ID Panel     Status: None   Collection Time: 02/15/22  4:15 PM   Specimen: BLOOD  Result Value Ref Range Status   Specimen Description BLOOD [facility]  Final   Special Requests BOTTLES DRAWN AEROBIC AND ANAEROBIC BCAV  Final   Culture   Final    NO GROWTH 5 DAYS Performed at [facility], [address] [place], [place] [address]    Report Status 02/20/2022 FINAL  Final  Body fluid culture w Gram Stain     Status: None   Collection Time: 02/16/22  8:25 PM   Specimen: Peritoneal Washings; Body Fluid  Result Value Ref Range Status   Specimen  Description   Final    PERITONEAL Performed at [facility], [address] [place], [place] [address]    Special Requests   Final    NONE Performed at [facility], [address] [place], [place] [address]    Gram Stain   Final    FEW WBC PRESENT, PREDOMINANTLY PMN RARE GRAM POSITIVE COCCI IN CHAINS    Culture   Final    NO GROWTH Performed at [facility], [address], [place], [place] [address]    Report Status 02/20/2022 FINAL  Final  MRSA Next Gen by PCR, Nasal     Status: None   Collection Time: 02/17/22 12:14 PM   Specimen: Nasal Mucosa; Nasal Swab  Result Value Ref Range Status   MRSA by PCR Next Gen NOT DETECTED NOT DETECTED Final    Comment: (NOTE) The GeneXpert MRSA Assay (FDA approved for NASAL specimens only), is one component of a comprehensive MRSA colonization surveillance program. It is not intended to diagnose MRSA infection nor to guide or monitor treatment for MRSA infections. Test performance is not FDA approved in patients less than 9 years old. Performed at [facility], [address] [place], [place] [address]   Body fluid culture w Gram Stain     Status: None (Preliminary result)   Collection Time: 02/18/22 11:55 AM   Specimen: Peritoneal Washings; Body Fluid  Result Value Ref Range Status   Specimen Description   Final    PERITONEAL Performed at [facility], [address] [place], [place] [address]    Special Requests   Final    NONE Performed at [facility], [address] [place], [place] [address]    Gram Stain   Final    FEW WBC PRESENT,BOTH PMN AND MONONUCLEAR NO ORGANISMS SEEN    Culture   Final    NO GROWTH 2 DAYS Performed at [facility], [address], [place], [place] [address]    Report Status PENDING  Incomplete    Coagulation Studies: No results for input(s): LABPROT, INR in the last 72 hours.  Urinalysis: No results for input(s): COLORURINE,  LABSPEC, PHURINE, GLUCOSEU, HGBUR, BILIRUBINUR, KETONESUR, PROTEINUR, UROBILINOGEN, NITRITE, LEUKOCYTESUR in the last 72 hours.  Invalid input(s): APPERANCEUR    Imaging: No results found.   Medications:    dianeal solution for CAPD/CCPD with additives     dialysis solution 4.25% low-MG/low-CA      ALPRAZolam  0.5 mg Oral QHS   apixaban  5 mg Oral BID   aspirin EC  81 mg Oral Daily   atorvastatin  20 mg Oral  Daily   epoetin (EPOGEN/PROCRIT) injection  20,000 Units Subcutaneous Weekly   furosemide  80 mg Intravenous Daily   gentamicin cream  1 application. Topical Daily   insulin aspart  0-15 Units Subcutaneous TID WC   insulin aspart  4 Units Subcutaneous TID WC   insulin detemir  24 Units Subcutaneous Daily   insulin detemir  30 Units Subcutaneous QHS   metoprolol succinate  25 mg Oral Daily   mometasone-formoterol  2 puff Inhalation BID   pantoprazole  40 mg Oral BID   potassium chloride  20 mEq Oral BID   vancomycin variable dose per unstable renal function (pharmacist dosing)   Does not apply See admin instructions     Assessment/ Plan:  [name] is a 76 y.o.  female with a past medical concerns including COPD, hypertension, depression, diabetes and end stage renal disease. Patient presents to ED with complaints vomiting, diarrhea and abdominal pain. She has been admitted for Shortness of breath [R06.02] Generalized abdominal pain [R10.84] Acute respiratory failure with hypoxia (HCC) [J96.01] Abdominal pain [R10.9] Hypervolemia, unspecified hypervolemia type [E87.70]  [facility] PD: 5 Cycles/ 2L fills/9 hours/ 10/20/0 last fill  End stage renal disease on peritoneal dialysis with suspicion of peritonitis. Recently received antibiotic therapy for 2 weeks prior to previous admission. Will continue scheduled antibiotic therapy. Vancomycin given IV in ED.  -Suspected that the PD fluid cell count performed on 02/16/2022 was erroneous.  We repeated PD fluid cell count on  02/20/2022 which showed total nucleated cell count of 265.  PD fluid cultures from both 4/4 and 4/6 are negative.  She is still having some abdominal pain however.  Continue vancomycin and ceftazidime for another 10 days at this time.  2. Anemia of chronic kidney disease  Normocytic  Lab Results  Component Value Date   HGB 10.3 (L) 02/20/2022    She will need ongoing erythropoietin stimulating agents as an outpatient.  3. Secondary Hyperparathyroidism: Lab Results  Component Value Date   CALCIUM 8.1 (L) 02/21/2022   Will continue to monitor bone minerals during this admission.   4. Diabetes mellitus type II with chronic kidney disease: insulin dependent. Home regimen includes NPH. Most recent hemoglobin A1c is 11.2 on 02/09/22.  Primary team to manage SSI.    LOS: 6 Amiya Escamilla s [facility] 4/9/20238:54 AM

## 2022-02-21 NOTE — Progress Notes (Signed)
Patient initiated on PD cycler for 9hr. Tx. Catheter site intact, no signs of infection, dressing changed. Pt in bed, primary nurse in room.  Initial drain started, prior to HD nurse exiting.  ? ?

## 2022-02-21 NOTE — Assessment & Plan Note (Addendum)
Mg 1.3 replaced with 4g IV Mg-sulfate ?Monitor and replace PRN. ?

## 2022-02-21 NOTE — Consult Note (Signed)
Pharmacy Antibiotic Note ? ?Andrea Stewart is a 76 y.o. female w/  h/o ESRD-PD, COPD, morbid obesity to ED c/o SOB and CC of abd'l pain admitted on 02/14/2022 with  intra-abdominal infection  (suspected peritonitis ).  Pharmacy has been consulted for Vancomycin & ceftazidime dosing. ? ?-Patient does not want to pursue hemodialysis ? ?Plan: ?Ceftazidime 1.5g q24h in PD fluid.  ?4/7 @0429 : Vancomycin random 34.  Plan to maintain the vancomycin level between 15-20. Recommend ordering vancomycin level on Monday (4/10) ? ? ?Height: 4\' 11"  (149.9 cm) ?Weight: 87.4 kg (192 lb 10.9 oz) ?IBW/kg (Calculated) : 43.2 ? ?Temp (24hrs), Avg:97.8 ?F (36.6 ?C), Min:97.6 ?F (36.4 ?C), Max:97.9 ?F (36.6 ?C) ? ?Recent Labs  ?Lab 02/14/22 ?2132 02/15/22 ?5956 02/15/22 ?1615 02/16/22 ?0401 02/17/22 ?3875 02/18/22 ?6433 02/19/22 ?0429 02/20/22 ?2951 02/21/22 ?8841  ?WBC 7.6 13.7* 8.6 7.6 6.1  --   --  6.0  --   ?CREATININE 3.78* 3.97*  --  5.24* 4.91* 4.55* 4.22*  --  4.47*  ?LATICACIDVEN 2.2* 1.0  --   --   --   --   --   --   --   ?VANCORANDOM  --   --   --   --   --  25 34  --   --   ? ?  ?Estimated Creatinine Clearance: 10.3 mL/min (A) (by C-G formula based on SCr of 4.47 mg/dL (H)).   ? ?No Known Allergies ? ?Antimicrobials this admission: ?Vancomycin / Ceftazidime (4/03 >>  ? ?Dose adjustments this admission: ?ESRD-PD -- CTM based on levels at 3-5d for Vanco dosing. ? ?Microbiology results: ?4/03 BCx: NG x 5 days ?4/03 Flu/Cov: negative  ?4/03 MRSA PCR: negative  ?4/4 peritoneal cx: rare GPC ?4/6 peritoneal: pending ? ?Thank you for allowing pharmacy to be a part of this patient?s care. ? ?Chinita Greenland PharmD ?Clinical Pharmacist ?02/21/2022 ? ? ? ?

## 2022-02-21 NOTE — Progress Notes (Signed)
PT Cancellation Note ? ?Patient Details ?Name: Andrea Stewart ?MRN: [id number] ?DOB: Apr 20, 1946 ? ? ?Cancelled Treatment:    Reason Eval/Treat Not Completed: Medical issues which prohibited therapy  ?  ?Potassium 2.5 during am lab draws this am.  Anticipate continue with PT tomorrow after interventions. ? ? ?Chesley Noon ?02/21/2022, 2:25 PM ?

## 2022-02-21 NOTE — Assessment & Plan Note (Addendum)
Resolved with replacement.   ?Repeat BMP at follow up, replace PRN. ?

## 2022-02-21 NOTE — Progress Notes (Signed)
?Progress Note ? ? ?Patient: Andrea Stewart KNL:[id number] DOB: 19-Dec-1945 DOA: 02/14/2022     6 ?DOS: the patient was seen and examined on 02/21/2022 ?  ?Brief hospital course: ?Andrea Stewart is a 76 y.o. female with medical history significant for recent discharge from the hospital on 02/14/2022 after hospitalization for atrial fibrillation with RVR and abdominal pain concerning for peritonitis, ESRD on peritoneal dialysis, COPD, morbid obesity, hypertension, anemia of chronic disease, type 2 diabetes mellitus, who was brought to the hospital because of abdominal pain and shortness of breath.  She initially required 4 L/min supplemental oxygen. ? ?Admitted to the hospital.  Started on empiric antibiotics pending peritoneal fluid cultures.  Nephrology consulted and recommending consideration to transition from PD to hemodialysis but patient declines to do so.  Also being treated with IV Lasix for ongoing edema and dyspnea. ? ?Patient does not want to pursue hemodialysis, if PD were to stop working well enough to remove adequate fluid.   ?Palliative Care consulted.  ? ? ? ?Assessment and Plan: ?* Abdominal pain ?Improved since admission, not fully resolved. ?Suspect peritonitis given peritoneal dialysis.  Patient was recently discharged with same but returned due to refractory symptoms at home.  Reportedly had rebound tenderness on admission. ?-- Continue empiric IV antibiotics, duration per nephrology ?-- Follow peritoneal fluid cultures ?-- Supportive care ?-- Nephrology following for PD ? ? ? ? ?Acute respiratory failure with hypoxia ([place]) ?Most likely due to volume overload in the setting of ESRD.  Nephrology following for dialysis.  Also on IV Lasix.  Initial O2 requirement 4 L/min, this morning 2 L/min. ?-- Supplement O2 to maintain sats above 90%, wean as tolerated ?-- Other management as outlined ? ?SOB (shortness of breath) ?Due to volume overload in the setting of ESRD.  On IV Lasix and peritoneal dialysis  does to be resumed. ?Nephrology consult.  ?Supplemental oxygen as needed. ?  ? ? ?End stage renal disease ([place]) ?On peritoneal dialysis, presenting with peritonitis.  Nephrology following. ?Discussions underway regarding transition to hemodialysis.  Patient has been declining this, continuing to discuss with husband.  She cites her brother's experience with hemodialysis was miserable, does not want to put herself through that. ?-- See nephrology recommendations ?-- Continue PD ? ? ? ?Essential hypertension ?BP is overall controlled. ?--Currently on IV Lasix per nephrology ? ?Hyperglycemia due to type 2 diabetes mellitus ([place]) ?Uncontrolled, A1c is 10.8% ?CBG this morning 310, lunch 179.   ?--Levemir 24 units in the morning ?--Levemir to 30 units nightly ?--Sliding scale NovoLog 0-15 ?--Mealtime NovoLog 4 units ?--Adjust insulin as needed for goal 140-180 ?--Appreciate diabetes coordinator recommendations ? ?Anemia ?Likely due to chronic disease. ?Hemoglobin stable.  Monitor CBC. ? ?Hypomagnesemia ?Mg 1.3 today ?Replaced with 4g IV Mg-sulfate ?Monitor and replace PRN. ? ?Hypokalemia ?Pt has not tolerated K replacement orally due to N/V.  Started on IV replacement this AM, burning her arm severely, had to be stopped. ? ?K 2.5 this AM ?--Attempt PO replacement, pill crushed in applesauce, preferably with food and antiemetic beforehand ? ?Hypoglycemia ?4/6 afternoon CBG dropped to 39, due to poor PO intake with N/V. ?--Hypoglycemia protocol ?--Would give 1/2 dose basal insulin if HS CBG still low, since her AM glucose has been 300's-400. ? ?Nausea & vomiting ?Antiemetics PRN. ?Likely related to peritonitis, ESRD/uremia, medications. ? ? ? ? ?  ? ?Subjective: Patient awake resting bed when seen today.  She reports notable.  She is she is tired of all of this and just wants  to quit.  Potassium is running in her IV and she reports severe burning in her arm begging for to be stopped.  She previously had vomited when we have  tried oral potassium.  She agrees to try again today with nausea medicine first.  Currently no other acute complaints.  [name] reports she was told earlier this morning that she antibiotics will not be able to be done at home so she needs to stay until the course is complete.  She becomes tearful saying she just wants to go home.. ? ?Physical Exam: ?Vitals:  ? 02/21/22 0423 02/21/22 0500 02/21/22 0837 02/21/22 1200  ?BP: (!) 147/118  139/73 (!) 122/98  ?Pulse: 85  (!) 109 87  ?Resp: 16  18   ?Temp: 97.9 ?F (36.6 ?C)  97.8 ?F (36.6 ?C) 97.7 ?F (36.5 ?C)  ?TempSrc:    Oral  ?SpO2: 96%  96% 94%  ?Weight:  87.4 kg    ?Height:      ? ?General exam: awake, alert, no acute distress, chronically ill-appearing, obese ?HEENT: moist mucus membranes, hearing grossly normal  ?Respiratory system: normal respiratory effort. ?Cardiovascular system: normal S1/S2, RRR, bilateral lower extremity pitting edema.   ?Gastrointestinal system: Distended, mildly tender throughout, PD catheter in place. ?Central nervous system: A&O x3. no gross focal neurologic deficits, normal speech ?Extremities: moves all, no cyanosis, normal tone ?Psychiatry: Depressed mood, congruent affect, judgement and insight appear normal ? ? ?Data Reviewed: ? ?Labs reviewed and notable for sodium 133, potassium 2.5, glucose 198, creatinine 4.47, calcium 8.1, magnesium 1.3, GFR 10 ? ?Family Communication: Husband was at bedside on rounds yesterday, not present today. ? ?Disposition: ?Status is: Inpatient ?Remains inpatient appropriate because: Severe electrolyte abnormalities, on intraperitoneal antibiotics.  Discharge pending clearance by nephrology ? ? ? Planned Discharge Destination: Home with Home Health ? ? ? ?Time spent: 35 minutes ? ?Author: ?Ezekiel Slocumb, DO ?02/21/2022 4:02 PM ? ?For on call review [email or web address].  ?

## 2022-02-22 LAB — BASIC METABOLIC PANEL
Anion gap: 7 (ref 5–15)
BUN: 13 mg/dL (ref 8–23)
CO2: 29 mmol/L (ref 22–32)
Calcium: 8.3 mg/dL — ABNORMAL LOW (ref 8.9–10.3)
Chloride: 98 mmol/L (ref 98–111)
Creatinine, Ser: 4.3 mg/dL — ABNORMAL HIGH (ref 0.44–1.00)
GFR, Estimated: 10 mL/min — ABNORMAL LOW (ref >60)
Glucose, Bld: 174 mg/dL — ABNORMAL HIGH (ref 70–99)
Potassium: 3 mmol/L — ABNORMAL LOW (ref 3.5–5.1)
Sodium: 134 mmol/L — ABNORMAL LOW (ref 135–145)

## 2022-02-22 LAB — MAGNESIUM: Magnesium: 1.9 mg/dL (ref 1.7–2.4)

## 2022-02-22 LAB — GLUCOSE, CAPILLARY
Glucose-Capillary: 138 mg/dL — ABNORMAL HIGH (ref 70–99)
Glucose-Capillary: 127 mg/dL — ABNORMAL HIGH (ref 70–99)

## 2022-02-22 LAB — VANCOMYCIN, RANDOM: Vancomycin Rm: 20

## 2022-02-22 MED ORDER — ALPRAZOLAM 0.5 MG PO TABS: 0.5000 mg | ORAL_TABLET | Freq: Every day | ORAL | 0 refills | Status: DC

## 2022-02-22 MED ORDER — BENZONATATE 100 MG PO CAPS: 100.0000 mg | ORAL_CAPSULE | Freq: Three times a day (TID) | ORAL | 0 refills | Status: DC | PRN

## 2022-02-22 MED ORDER — BENZONATATE 100 MG PO CAPS: 100.0000 mg | ORAL_CAPSULE | Freq: Three times a day (TID) | ORAL | Status: DC | PRN

## 2022-02-22 MED ORDER — POTASSIUM CHLORIDE CRYS ER 20 MEQ PO TBCR: 40.0000 meq | EXTENDED_RELEASE_TABLET | ORAL | Status: DC

## 2022-02-22 MED ORDER — GENTAMICIN SULFATE 0.1 % EX CREA: 1.0000 "application " | TOPICAL_CREAM | Freq: Every day | CUTANEOUS | 0 refills | Status: AC

## 2022-02-22 MED ORDER — HYDROXYZINE HCL 10 MG PO TABS
10.0000 mg | ORAL_TABLET | Freq: Three times a day (TID) | ORAL | 0 refills | Status: DC | PRN
Start: 2022-02-22 — End: 2022-02-22

## 2022-02-22 MED ORDER — PROCHLORPERAZINE MALEATE 10 MG PO TABS: 10.0000 mg | ORAL_TABLET | Freq: Four times a day (QID) | ORAL | 0 refills | Status: DC | PRN

## 2022-02-22 MED ORDER — BENZONATATE 100 MG PO CAPS: 100.0000 mg | ORAL_CAPSULE | Freq: Three times a day (TID) | ORAL | 0 refills | Status: AC | PRN

## 2022-02-22 MED ORDER — LOPERAMIDE HCL 2 MG PO CAPS
2.0000 mg | ORAL_CAPSULE | Freq: Once | ORAL | Status: AC
  Administered 2022-02-22: 2 mg via ORAL
  Filled 2022-02-22: qty 1

## 2022-02-22 MED ORDER — ONDANSETRON 4 MG PO TBDP
4.0000 mg | ORAL_TABLET | Freq: Three times a day (TID) | ORAL | 0 refills | Status: DC | PRN
Start: 2022-02-22 — End: 2022-02-22

## 2022-02-22 MED ORDER — POTASSIUM CHLORIDE CRYS ER 20 MEQ PO TBCR
40.0000 meq | EXTENDED_RELEASE_TABLET | ORAL | Status: DC
  Administered 2022-02-22: 40 meq via ORAL
  Filled 2022-02-22: qty 2

## 2022-02-22 MED ORDER — GENTAMICIN SULFATE 0.1 % EX CREA: 1.0000 "application " | TOPICAL_CREAM | Freq: Every day | CUTANEOUS | 0 refills | Status: DC

## 2022-02-22 MED ORDER — MENTHOL 3 MG MT LOZG: 1.0000 | LOZENGE | OROMUCOSAL | Status: DC | PRN

## 2022-02-22 MED ORDER — POTASSIUM CHLORIDE 20 MEQ PO PACK
40.0000 meq | PACK | ORAL | Status: DC
  Administered 2022-02-22: 40 meq via ORAL
  Filled 2022-02-22: qty 2

## 2022-02-22 MED ORDER — POTASSIUM CHLORIDE CRYS ER 20 MEQ PO TBCR: 40.0000 meq | EXTENDED_RELEASE_TABLET | Freq: Every day | ORAL | 0 refills | Status: AC

## 2022-02-22 MED ORDER — ONDANSETRON 4 MG PO TBDP: 4.0000 mg | ORAL_TABLET | Freq: Three times a day (TID) | ORAL | 0 refills | Status: AC | PRN

## 2022-02-22 MED ORDER — HYDROXYZINE HCL 10 MG PO TABS
10.0000 mg | ORAL_TABLET | Freq: Three times a day (TID) | ORAL | 0 refills | Status: AC | PRN
Start: 2022-02-22 — End: ?

## 2022-02-22 MED ORDER — PROCHLORPERAZINE MALEATE 10 MG PO TABS: 10.0000 mg | ORAL_TABLET | Freq: Four times a day (QID) | ORAL | 0 refills | Status: AC | PRN

## 2022-02-22 MED ORDER — VANCOMYCIN 100 MG/ML FOR DIALYSIS
Freq: Once | Status: AC
  Filled 2022-02-22: qty 0

## 2022-02-22 MED ORDER — ALPRAZOLAM 0.5 MG PO TABS: 0.5000 mg | ORAL_TABLET | Freq: Every day | ORAL | 0 refills | Status: AC

## 2022-02-22 MED ORDER — POTASSIUM CHLORIDE CRYS ER 20 MEQ PO TBCR: 40.0000 meq | EXTENDED_RELEASE_TABLET | Freq: Every day | ORAL | 0 refills | Status: DC

## 2022-02-22 MED FILL — Ceftazidime For IV Soln 2 GM: INTRAVENOUS | Qty: 1.5 | Status: AC

## 2022-02-22 MED FILL — Vancomycin HCl For IV Soln 1 GM (Base Equivalent): INTRAVENOUS | Qty: 20 | Status: AC

## 2022-02-22 MED FILL — Ceftazidime For IV Soln 2 GM: INTRAVENOUS | Qty: 2 | Status: AC

## 2022-02-22 NOTE — Progress Notes (Addendum)
?[facility]  ?ROUNDING NOTE  ? ?Subjective:  ? ?Andrea Stewart is a 76 year old female with a past medical concerns including COPD, hypertension, depression, diabetes and end stage renal disease. Patient presents to ED with complaints vomiting, diarrhea and abdominal pain. She has been admitted for Shortness of breath [R06.02] ?Generalized abdominal pain [R10.84] ?Acute respiratory failure with hypoxia ([place]) [J96.01] ?Abdominal pain [R10.9] ?Hypervolemia, unspecified hypervolemia type [E87.70] ? ?She is known to our practice and receives outpatient follow-up with Dr. Juleen China.  Patient was recently admitted and discharged yesterday due to abdominal pain, increased swelling and shortness of breath.  Patient was suspected to have peritonitis and was discharged with an outpatient plan to follow-up with outpatient dialysis clinic to receive antibiotics with peritoneal dialysis treatments. ? ?Update: ?Patient seen sitting in chair, husband at bedside  ?Denies abdominal pain ? ? ? ?Objective:  ?Vital signs in last 24 hours:  ?Temp:  [97.5 ?F (36.4 ?C)-98 ?F (36.7 ?C)] 97.5 ?F (36.4 ?C) (04/10 1137) ?Pulse Rate:  [77-97] 85 (04/10 1137) ?Resp:  [17-18] 18 (04/10 0545) ?BP: (127-146)/(59-86) 146/59 (04/10 1137) ?SpO2:  [94 %-100 %] 94 % (04/10 1137) ?Weight:  [86.2 kg-89.9 kg] 89.9 kg (04/10 1000) ? ?Weight change: -1.2 kg ?Filed Weights  ? 02/21/22 0500 02/22/22 0545 02/22/22 1000  ?Weight: 87.4 kg 86.2 kg 89.9 kg  ? ? ?Intake/Output: ?I/O last 3 completed shifts: ?In: 10718.4 [P.O.:620; Other:10000; IV Piggyback:98.4] ?Out: [phone] [[id number]] ?  ?Intake/Output this shift: ? Total I/O ?In: 10000 [Other:10000] ?Out: 18563 [[id number]] ? ?Physical Exam: ?General: NAD  ?Head: Normocephalic, atraumatic. Moist oral mucosal membranes  ?Eyes: Anicteric  ?Lungs:  Clear to auscultation, normal effort  ?Heart: Regular rate and rhythm  ?Abdomen:  Soft, tender, PD catheter  ?Extremities:  1+BLE, 1+ BUE peripheral  edema.  ?Neurologic: Alert, moving all four extremities  ?Skin: No lesions  ?Access: PD catheter  ? ? ?Basic Metabolic Panel: ?Recent Labs  ?Lab 02/17/22 ?5885 02/18/22 ?0277 02/19/22 ?4128 02/21/22 ?7867 02/22/22 ?6720  ?NA 134* 131* 133* 133* 134*  ?K 3.7 3.2* 3.0* 2.5* 3.0*  ?CL 99 94* 98 98 98  ?CO2 27 28 26 30 29   ?GLUCOSE 537* 585* 412* 198* 174*  ?BUN 28* 24* 18 14 13   ?CREATININE 4.91* 4.55* 4.22* 4.47* 4.30*  ?CALCIUM 8.2* 8.2* 8.1* 8.1* 8.3*  ?MG  --   --   --  1.3* 1.9  ? ? ? ?Liver Function Tests: ?No results for input(s): AST, ALT, ALKPHOS, BILITOT, PROT, ALBUMIN in the last 168 hours. ? ?No results for input(s): LIPASE, AMYLASE in the last 168 hours. ?No results for input(s): AMMONIA in the last 168 hours. ? ?CBC: ?Recent Labs  ?Lab 02/15/22 ?1615 02/16/22 ?0401 02/17/22 ?9470 02/20/22 ?0542  ?WBC 8.6 7.6 6.1 6.0  ?NEUTROABS  --   --  3.4  --   ?HGB 8.8* 8.7* 8.5* 10.3*  ?HCT 29.0* 28.7* 27.6* 32.5*  ?MCV 99.0 100.3* 97.2 96.2  ?PLT 262 240 260 333  ? ? ? ?Cardiac Enzymes: ?No results for input(s): CKTOTAL, CKMB, CKMBINDEX, TROPONINI in the last 168 hours. ? ?BNP: ?Invalid input(s): POCBNP ? ?CBG: ?Recent Labs  ?Lab 02/21/22 ?1228 02/21/22 ?1632 02/21/22 ?2051 02/22/22 ?0750 02/22/22 ?1211  ?GLUCAP 137* 152* 205* 138* 127*  ? ? ? ?Microbiology: ?Results for orders placed or performed during the hospital encounter of 02/14/22  ?Resp Panel by RT-PCR (Flu A&B, Covid) Nasopharyngeal Swab     Status: None  ? Collection Time: 02/14/22 10:07 PM  ?  Specimen: Nasopharyngeal Swab; Nasopharyngeal(NP) swabs in vial transport medium  ?Result Value Ref Range Status  ? SARS Coronavirus 2 by RT PCR NEGATIVE NEGATIVE Final  ?  Comment: (NOTE) ?SARS-CoV-2 target nucleic acids are NOT DETECTED. ? ?The SARS-CoV-2 RNA is generally detectable in upper respiratory ?specimens during the acute phase of infection. The lowest ?concentration of SARS-CoV-2 viral copies this assay can detect is ?138 copies/mL. A negative result  does not preclude SARS-Cov-2 ?infection and should not be used as the sole basis for treatment or ?other patient management decisions. A negative result may occur with  ?improper specimen collection/handling, submission of specimen other ?than nasopharyngeal swab, presence of viral mutation(s) within the ?areas targeted by this assay, and inadequate number of viral ?copies(<138 copies/mL). A negative result must be combined with ?clinical observations, patient history, and epidemiological ?information. The expected result is Negative. ? ?Fact Sheet for Patients:  ?EntrepreneurPulse.com.au ? ?Fact Sheet for Healthcare Providers:  ?IncredibleEmployment.be ? ?This test is no t yet approved or cleared by the [place] FDA and  ?has been authorized for detection and/or diagnosis of SARS-CoV-2 by ?FDA under an Emergency Use Authorization (EUA). This EUA will remain  ?in effect (meaning this test can be used) for the duration of the ?COVID-19 declaration under Section 564(b)(1) of the Act, 21 ?U.S.C.section 360bbb-3(b)(1), unless the authorization is terminated  ?or revoked sooner.  ? ? ?  ? Influenza A by PCR NEGATIVE NEGATIVE Final  ? Influenza B by PCR NEGATIVE NEGATIVE Final  ?  Comment: (NOTE) ?The Xpert Xpress SARS-CoV-2/FLU/RSV plus assay is intended as an aid ?in the diagnosis of influenza from Nasopharyngeal swab specimens and ?should not be used as a sole basis for treatment. Nasal washings and ?aspirates are unacceptable for Xpert Xpress SARS-CoV-2/FLU/RSV ?testing. ? ?Fact Sheet for Patients: ?EntrepreneurPulse.com.au ? ?Fact Sheet for Healthcare Providers: ?IncredibleEmployment.be ? ?This test is not yet approved or cleared by the [place] FDA and ?has been authorized for detection and/or diagnosis of SARS-CoV-2 by ?FDA under an Emergency Use Authorization (EUA). This EUA will remain ?in effect (meaning this test can be used) for  the duration of the ?COVID-19 declaration under Section 564(b)(1) of the Act, 21 U.S.C. ?section 360bbb-3(b)(1), unless the authorization is terminated or ?revoked. ? ?Performed at [facility], [place], ?[name] [address] ?  ?CULTURE, BLOOD (ROUTINE X 2) w Reflex to ID Panel     Status: None  ? Collection Time: 02/15/22  4:14 PM  ? Specimen: BLOOD  ?Result Value Ref Range Status  ? Specimen Description BLOOD RW  Final  ? Special Requests BOTTLES DRAWN AEROBIC AND ANAEROBIC BCAV  Final  ? Culture   Final  ?  NO GROWTH 5 DAYS ?Performed at [facility], [address], [place], [place] [address] ?  ? Report Status 02/20/2022 FINAL  Final  ?CULTURE, BLOOD (ROUTINE X 2) w Reflex to ID Panel     Status: None  ? Collection Time: 02/15/22  4:15 PM  ? Specimen: BLOOD  ?Result Value Ref Range Status  ? Specimen Description BLOOD [facility]  Final  ? Special Requests BOTTLES DRAWN AEROBIC AND ANAEROBIC BCAV  Final  ? Culture   Final  ?  NO GROWTH 5 DAYS ?Performed at [facility], [address], [place], [place] [address] ?  ? Report Status 02/20/2022 FINAL  Final  ?Body fluid culture w Gram Stain     Status: None  ? Collection Time: 02/16/22  8:25 PM  ?  Specimen: Peritoneal Washings; Body Fluid  ?Result Value Ref Range Status  ? Specimen Description   Final  ?  PERITONEAL ?Performed at [facility], [address], [place], [place] [address] ?  ? Special Requests   Final  ?  NONE ?Performed at [facility], [address], [place], [place] [address] ?  ? Gram Stain   Final  ?  FEW WBC PRESENT, PREDOMINANTLY PMN ?RARE GRAM POSITIVE COCCI IN CHAINS ?  ? Culture   Final  ?  NO GROWTH ?Performed at [facility], [place] [address], [place], [place] [address] ?  ? Report Status 02/20/2022 FINAL  Final  ?MRSA Next Gen by PCR, Nasal     Status: None  ? Collection Time: 02/17/22 12:14 PM  ? Specimen: Nasal Mucosa; Nasal Swab  ?Result Value Ref Range Status  ? MRSA by PCR  Next Gen NOT DETECTED NOT DETECTED Final  ?  Comment: (NOTE) ?The GeneXpert MRSA Assay (FDA approved for NASAL specimens only), ?is one component of a comprehensive MRSA colonization surveillance ?program. I

## 2022-02-22 NOTE — Discharge Summary (Signed)
?Physician Discharge Summary ?  ?Patient: Andrea Stewart MRN: [id number] DOB: [birth year]  ?Admit date:     02/14/2022  ?Discharge date: 03/08/22  ?Discharge Physician: Ezekiel Slocumb  ? ?PCP: Pcp, No  ? ?Recommendations at discharge:  ? ? [facility] palliative care to follow up at home ?Follow up with Primary Care in 1-2 weeks ?Repeat BMP, Mg, CBC in 1 week ?Follow up with nephrology as scheduled and continue Peritoneal Dialysis nightly ? ?Discharge Diagnoses: ?Principal Problem: ?  Abdominal pain ?Active Problems: ?  SOB (shortness of breath) ?  Acute respiratory failure with hypoxia ([place]) ?  End stage renal disease ([place]) ?  Essential hypertension ?  Hyperglycemia due to type 2 diabetes mellitus ([place]) ?  Anemia ?  Nausea & vomiting ?  Hypoglycemia ?  Hypokalemia ?  Hypomagnesemia ? ? ?Hospital Course: ?Andrea Stewart is a 76 y.o. female with medical history significant for recent discharge from the hospital on 02/14/2022 after hospitalization for atrial fibrillation with RVR and abdominal pain concerning for peritonitis, ESRD on peritoneal dialysis, COPD, morbid obesity, hypertension, anemia of chronic disease, type 2 diabetes mellitus, who was brought to the hospital because of abdominal pain and shortness of breath.  She initially required 4 L/min supplemental oxygen. ? ?Admitted to the hospital.  Started on empiric antibiotics pending peritoneal fluid cultures.  Nephrology consulted and recommending consideration to transition from PD to hemodialysis but patient declines to do so.  Also being treated with IV Lasix for ongoing edema and dyspnea. ? ?Patient does not want to pursue hemodialysis, if PD were to stop working well enough to remove adequate fluid.   ?Palliative Care consulted.  ? ? ?Additional hospital course details and management as outlined below. ? ? ?Assessment and Plan: ?* Abdominal pain ?Improved since admission, not fully resolved. ?Suspect peritonitis given peritoneal dialysis.   Patient was recently discharged with same but returned due to refractory symptoms at home.  Reportedly had rebound tenderness on admission. ?-- Treated with empiric intraperitoneal antibiotics, course completed ?-- Follow peritoneal fluid cultures until final - neg to date ?-- Supportive care - antiemetics, pain control,  ?-- PD per nephrology ? ? ? ?Acute respiratory failure with hypoxia ([place]) ?Most likely due to volume overload in the setting of ESRD.  Nephrology following for dialysis.  Also on IV Lasix.  Initial O2 requirement 4 L/min, this morning 2 L/min. ?-- Supplement O2 to maintain sats above 90%, wean as tolerated ?-- Other management as outlined ? ?SOB (shortness of breath) ?Due to volume overload in the setting of ESRD.   ?Treated w Lasix and peritoneal dialysis  ?Nephrology consult.  ?Supplemental oxygen as needed. ?Clinically improved. ?  ? ? ?End stage renal disease ([place]) ?On peritoneal dialysis, presenting with peritonitis.  Nephrology following. ?Discussions underway regarding transition to hemodialysis.  Patient has been declining this, continuing to discuss with husband.  She cites her brother's experience with hemodialysis was miserable, does not want to put herself through that. ?-- See nephrology recommendations ?-- Continue PD ? ? ? ?Essential hypertension ?BP is overall controlled. ?-- Lasix per nephrology ? ?Hyperglycemia due to type 2 diabetes mellitus ([place]) ?Uncontrolled, A1c is 10.8% ?--Close follow up for insulin adjustments ?--Levemir 22 units in the morning ?--Levemir to 28 units nightly ?--Prior home NovoLog 3-5 units TID WC ? ?Anemia ?Likely due to chronic disease. ?Hemoglobin stable.  Monitor CBC. ? ?Hypomagnesemia ?Mg 1.3 replaced with 4g IV Mg-sulfate ?Monitor and replace PRN. ? ?Hypokalemia ?Resolved with replacement.   ?  Repeat BMP at follow up, replace PRN. ? ?Hypoglycemia ?4/6 afternoon CBG dropped to 39, due to poor PO intake with N/V. ?--Hypoglycemia protocol ?--Improved once  PO intake improved, but generally patient's appetite declined over course of admission ?--Monitor closely ? ?Nausea & vomiting ?Antiemetics PRN. ?Likely related to peritonitis, ESRD/uremia, medications. ? ? ? ? ?  ? ? ?Consultants: Nephrology, Palliative Care ?Procedures performed: PD  ?Disposition: Home health ?Diet recommendation:  ? ? ?  ?Diet recommendations: ?Renal diet, carb modified. ? ? ? ?DISCHARGE MEDICATION: ?Allergies as of 02/22/2022   ? ?   Reactions  ? Gabapentin Other (See Comments)  ? intolerance ?intolerance ?"makes me crazy" ?"makes me crazy"  ? Morphine Anaphylaxis  ? Morphine Shortness Of Breath  ? Penicillins Swelling, Other (See Comments)  ? Remote reaction as a child to [place]. Patient has tolerated augmentin and cefazolin  ?REACTION: rash, swelling.   ? Prednisone Other (See Comments)  ? "makes me think I am having a heart attack"  ? Hydralazine Itching  ? Nsaids Diarrhea  ? ?  ? ?  ?Medication List  ?  ? ?TAKE these medications   ? ?acetaminophen 650 MG CR tablet ?Commonly known as: TYLENOL ?Take 650 mg by mouth every 6 (six) hours as needed for pain. ?  ?albuterol 108 (90 Base) MCG/ACT inhaler ?Commonly known as: VENTOLIN HFA ?Inhale 2 puffs into the lungs every 4 (four) hours as needed. ?  ?ALPRAZolam 0.5 MG tablet ?Commonly known as: Duanne Moron ?Take 1 tablet (0.5 mg total) by mouth at bedtime. ?  ?apixaban 5 MG Tabs tablet ?Commonly known as: ELIQUIS ?Take 5 mg by mouth 2 (two) times daily. ?  ?aspirin 81 MG EC tablet ?Take 81 mg by mouth daily. ?  ?atorvastatin 20 MG tablet ?Commonly known as: LIPITOR ?Take 20 mg by mouth daily. ?  ?Baqsimi Two Pack 3 MG/DOSE Powd ?Generic drug: Glucagon ?See admin instructions. Use as directed ?  ?benzonatate 100 MG capsule ?Commonly known as: TESSALON ?Take 1 capsule (100 mg total) by mouth 3 (three) times daily as needed for cough. ?  ?budesonide-formoterol 160-4.5 MCG/ACT inhaler ?Commonly known as: SYMBICORT ?Inhale 2 puffs into the lungs 2 (two) times  daily. ?  ?furosemide 80 MG tablet ?Commonly known as: LASIX ?Take 80 mg by mouth daily. ?  ?gentamicin cream 0.1 % ?Commonly known as: GARAMYCIN ?Apply 1 application. topically daily. ?  ?hydrOXYzine 10 MG tablet ?Commonly known as: ATARAX ?Take 1 tablet (10 mg total) by mouth 3 (three) times daily as needed for itching. ?  ?insulin lispro 100 UNIT/ML injection ?Commonly known as: HUMALOG ?Inject 3-5 Units into the skin 3 (three) times daily with meals. ?  ?nitroGLYCERIN 0.4 MG SL tablet ?Commonly known as: NITROSTAT ?Place 0.4 mg under the tongue every 5 (five) minutes as needed for chest pain (Max of 3 tablets). ?  ?NovoLIN N 100 UNIT/ML injection ?Generic drug: insulin NPH Human ?Inject 22 units subcutaneously in the morning and 28 units at bedtime. ?  ?ondansetron 4 MG disintegrating tablet ?Commonly known as: ZOFRAN-ODT ?Take 1 tablet (4 mg total) by mouth every 8 (eight) hours as needed for nausea or vomiting. ?  ?ondansetron 4 MG tablet ?Commonly known as: ZOFRAN ?Take 4 mg by mouth daily. ?  ?pantoprazole 40 MG tablet ?Commonly known as: PROTONIX ?Take 40 mg by mouth 2 (two) times daily. ?  ?Phoslyra 667 MG/5ML Soln ?Generic drug: calcium acetate (Phos Binder) ?Take 667 mg by mouth 3 (three) times daily. ?  ?  polycarbophil 625 MG tablet ?Commonly known as: FIBERCON ?Take 625 mg by mouth daily. ?  ?polyethylene glycol 17 g packet ?Commonly known as: MIRALAX / GLYCOLAX ?Take 17 g by mouth daily as needed. ?  ?potassium chloride SA 20 MEQ tablet ?Commonly known as: KLOR-CON M ?Take 2 tablets (40 mEq total) by mouth daily. ?  ?prochlorperazine 10 MG tablet ?Commonly known as: COMPAZINE ?Take 1 tablet (10 mg total) by mouth every 6 (six) hours as needed for nausea or vomiting. ?  ?senna 8.6 MG Tabs tablet ?Commonly known as: SENOKOT ?Take 8.6 mg by mouth daily as needed for mild constipation. ?  ?spironolactone 25 MG tablet ?Commonly known as: ALDACTONE ?Take 25 mg by mouth daily. ?  ?Toprol XL 25 MG 24 hr  tablet ?Generic drug: metoprolol succinate ?Take 25 mg by mouth daily. ?  ? ?  ? ?  ?  ? ? ?  ?Discharge Care Instructions  ?(From admission, onward)  ?  ? ? ?  ? ?  Start     Ordered  ? 02/22/22 0000  Disc

## 2022-02-22 NOTE — Consult Note (Signed)
Pharmacy Antibiotic Note ? ?Andrea Stewart is a 76 y.o. female w/  h/o ESRD-PD, COPD, morbid obesity to ED c/o SOB and CC of abd'l pain admitted on 02/14/2022 with  intra-abdominal infection  (suspected peritonitis ).  Pharmacy has been consulted for Vancomycin & ceftazidime dosing. ? ?Plan: ?Ceftazidime 1.5g q24h in PD fluid.  ?Vancomycin random 20. Will put vancomycin 1000 mg in PD fluid. Plan to maintain the vancomycin level between 15-20. Recommend ordering vancomycin level on Thursday (4/13) ? ? ?Height: 4\' 11"  (149.9 cm) ?Weight: 86.2 kg (190 lb 0.6 oz) ?IBW/kg (Calculated) : 43.2 ? ?Temp (24hrs), Avg:97.8 ?F (36.6 ?C), Min:97.7 ?F (36.5 ?C), Max:98 ?F (36.7 ?C) ? ?Recent Labs  ?Lab 02/15/22 ?1615 02/16/22 ?0401 02/16/22 ?0401 02/17/22 ?4388 02/18/22 ?8757 02/18/22 ?9728 02/19/22 ?0429 02/20/22 ?2060 02/21/22 ?1561 02/22/22 ?5379  ?WBC 8.6 7.6  --  6.1  --   --   --  6.0  --   --   ?CREATININE  --  5.24*   < > 4.91* 4.55*  --  4.22*  --  4.47* 4.30*  ?VANCORANDOM  --   --   --   --  25   < > 34  --   --  20  ? < > = values in this interval not displayed.  ? ?  ?Estimated Creatinine Clearance: 10.6 mL/min (A) (by C-G formula based on SCr of 4.3 mg/dL (H)).   ? ?Allergies  ?Allergen Reactions  ? Morphine Shortness Of Breath  ? ? ?Antimicrobials this admission: ?Vancomycin / Ceftazidime (4/03 >>  ? ?Dose adjustments this admission: ?ESRD-PD -- CTM based on levels at 3-5d for Vanco dosing. ? ?Microbiology results: ?4/03 BCx: NG x 5 days ?4/03 Flu/Cov: negative  ?4/03 MRSA PCR: negative  ?4/4 peritoneal cx: rare GPC ?4/6 peritoneal: pending ? ?Thank you for allowing pharmacy to be a part of this patient?s care. ? ?Eleonore Chiquito, PharmD, BCPS ?Clinical Pharmacist ?02/22/2022 ? ? ? ?

## 2022-02-22 NOTE — Plan of Care (Signed)
Consult noted for goals of care.  Currently attending advises patient is discharging home.  She will order outpatient palliative to follow there.  Please reconsult for any needs. ?

## 2022-02-22 NOTE — Progress Notes (Signed)
OT Cancellation Note ? ?Patient Details ?Name: Andrea Stewart ?MRN: [id number] ?DOB: [birth year] ? ? ?Cancelled Treatment:    Reason Eval/Treat Not Completed: Fatigue/lethargy limiting ability to participate. Pt declines therapy this AM, saying she was only able to sleep 1 hr last night and has no energy. States she also does not want therapy this afternoon, as she will soon start receiving IV antibiotics that she knows will make her "feel bad." Will attempt at a later date as pt is available and willing.  ? ?Josiah Lobo, PhD, MS, OTR/L ?02/22/22, 9:38 AM ? ?

## 2022-02-22 NOTE — Care Management Important Message (Signed)
Important Message ? ?Patient Details  ?Name: Andrea Stewart ?MRN: [id number] ?Date of Birth: [birth year] ? ? ?Medicare Important Message Given:  Yes ? ? ? ? ?Dannette Barbara ?02/22/2022, 12:09 PM ?

## 2022-02-22 NOTE — Progress Notes (Signed)
[facility] Liaison Note ? ?Notified by TOC of patient/family request of [facility] Paliative services. ? ?[facility] hospital liaison will follow patient for discharge disposition.  ? ?Please call with any questions/concerns.  ?  ?Thank you for the opportunity to participate in this patient's care. ?  ?[name] Daniel, MSW ?[facility] Hospital Liaison  ?[phone] ? ?

## 2022-02-25 DIAGNOSIS — R109 Unspecified abdominal pain: Secondary | ICD-10-CM | POA: Diagnosis not present

## 2022-02-25 DIAGNOSIS — Z8719 Personal history of other diseases of the digestive system: Secondary | ICD-10-CM | POA: Diagnosis not present

## 2022-02-25 DIAGNOSIS — Z992 Dependence on renal dialysis: Secondary | ICD-10-CM | POA: Diagnosis not present

## 2022-02-25 DIAGNOSIS — E1165 Type 2 diabetes mellitus with hyperglycemia: Secondary | ICD-10-CM | POA: Diagnosis not present

## 2022-03-01 ENCOUNTER — Telehealth: Payer: Self-pay

## 2022-03-01 LAB — BLOOD GAS, VENOUS
Acid-Base Excess: 6.2 mmol/L — ABNORMAL HIGH (ref 0.0–2.0)
Bicarbonate: 32.7 mmol/L — ABNORMAL HIGH (ref 20.0–28.0)
Patient temperature: 37
pCO2, Ven: 54 mmHg (ref 44–60)
pH, Ven: 7.39 (ref 7.25–7.43)
pO2, Ven: 33 mmHg (ref 32–45)

## 2022-03-01 NOTE — Telephone Encounter (Signed)
Spoke with patient's son Andrea Stewart and spouse Andrea Stewart regarding Palliative Care services. Offered a virtual consult for 4/18 with Latoya. Andrea Stewart states patient has 2 other appointments the same day. Emailed provider for alternate appointment time/date for this week if possible.  ? ?

## 2022-03-01 NOTE — Telephone Encounter (Signed)
Spoke with patient's spouse Carloyn Manner and scheduled a in person Palliative Consult for 03/03/22 @ 1 PM. ? ?Consent obtained; updated Netsmart, Team List and Epic.  ? ?

## 2022-03-02 DIAGNOSIS — T8571XD Infection and inflammatory reaction due to peritoneal dialysis catheter, subsequent encounter: Secondary | ICD-10-CM | POA: Diagnosis not present

## 2022-03-02 DIAGNOSIS — E118 Type 2 diabetes mellitus with unspecified complications: Secondary | ICD-10-CM | POA: Diagnosis not present

## 2022-03-02 DIAGNOSIS — N186 End stage renal disease: Secondary | ICD-10-CM | POA: Diagnosis not present

## 2022-03-02 DIAGNOSIS — Z794 Long term (current) use of insulin: Secondary | ICD-10-CM | POA: Diagnosis not present

## 2022-03-03 ENCOUNTER — Other Ambulatory Visit: Payer: PPO | Admitting: Student

## 2022-03-03 DIAGNOSIS — Z992 Dependence on renal dialysis: Secondary | ICD-10-CM

## 2022-03-03 DIAGNOSIS — R0602 Shortness of breath: Secondary | ICD-10-CM

## 2022-03-03 DIAGNOSIS — Z515 Encounter for palliative care: Secondary | ICD-10-CM

## 2022-03-03 DIAGNOSIS — R531 Weakness: Secondary | ICD-10-CM

## 2022-03-03 DIAGNOSIS — R110 Nausea: Secondary | ICD-10-CM

## 2022-03-03 DIAGNOSIS — R52 Pain, unspecified: Secondary | ICD-10-CM

## 2022-03-03 NOTE — Progress Notes (Signed)
? ? ?Manufacturing engineer ?Community Palliative Care Consult Note ?Telephone: [phone]  ?Fax: [phone]  ? ?Date of encounter: 03/03/22 ?1:01 PM ?PATIENT NAME: Andrea Stewart ?[place] [address]   ?[phone] (home)  ?DOB: 05-27-1946 ?MRN: [id number] ?PRIMARY CARE PROVIDER:    ?Pcp, No,  ?No address on file ?None ? ?REFERRING PROVIDER:   ?Andrea Aus, MD ?[place] [facility] ?[place],  [place] [address] ?[phone] ? ?RESPONSIBLE PARTY:    ?Contact Information   ? ? Name Relation Home Work Mobile  ? Andrea Stewart Son   [phone]  ? [place] [phone]   ? Andrea Stewart Spouse   [phone]  ? ?  ? ? ? ?I met face to face with patient and family in the home. Palliative Care was asked to follow this patient by consultation request of  Andrea Aus, MD to address advance care planning and complex medical decision making. This is the initial visit.  ? ? ?                                 ASSESSMENT AND PLAN / RECOMMENDATIONS:  ? ?Advance Care Planning/Goals of Care: Goals include to maximize quality of life and symptom management. Patient/health care surrogate gave his/her permission to discuss.Our advance care planning conversation included a discussion about:    ?The value and importance of advance care planning  ?Experiences with loved ones who have been seriously ill or have died  ?Exploration of personal, cultural or spiritual beliefs that might influence medical decisions  ?Exploration of goals of care in the event of a sudden injury or illness  ?Decision not to resuscitate or to de-escalate disease focused treatments due to poor prognosis. ?CODE STATUS: DNR; completed and copy left in the home.  ? ?Education provided on palliative medicine vs. Hospice services. Patient has expressed wanting to be managed in the home, no further hospitalization. She acknowledges her PD failing and  is adamant about not  transitioning to HD. Given her decline and symptoms, will discuss with hospice medical director eligibility.  ? ?I spent 20 minutes providing this consultation. More than 50% of the time in this consultation was spent in counseling and care coordination. ? ?---------------------------------------------------------------------------------------------------------------------------------------- ? ?Symptom Management/Plan: ? ?ESRD-currently on peritoneal dialysis. Her PD is failing, only pulling off small amount of fluid. She is volume overloaded, 30 pounds over. She states she does not want to transition to HD. She expresses comfort and management in the home. Continue furosemide, spironolactone as directed.  ? ?Shortness of breath-secondary to volume overload/ESRD, COPD. Continue albuterol, Symbicort as directed. Patient sats drop to 87% on room air, back up to 92% after resting approximately 5 minutes. Recommend oxygen at 2 lpm via n/c continuously for shortness of breath.  ? ?Pain-patient with generalized pain and abdominal pain. Continue acetaminophen PRN. Restart hydrocodone-acetaminophen 5/325 mg every 6 hours PRN pain. Education on max daily limit of acetaminophen 3000 mg.  ? ?Generalized weakness-continue using walker for ambulation. Patient is currently receiving PT and SN services with [facility].  ? ?Nausea-continue ondansetron PRN nausea.  ? ?Follow up Palliative Care Visit: Palliative care will continue to follow for complex medical decision making, advance care planning, and clarification of goals. Return in 2 weeks or prn. ? ? ?This visit was coded based on medical decision making (MDM). ? ?PPS: 40%, weak ? ?HOSPICE ELIGIBILITY/DIAGNOSIS: TBD ? ?Chief Complaint: Palliative Medicine  initial consult.  ? ?HISTORY OF PRESENT ILLNESS:  Andrea Stewart is a 76 y.o. year old female  with ESRD-on peritoneal dialysis, acquired absence of kidney, arthritis, ASCVD, COPD, depression, type 2 diabetes, hyperlipidemia,  hypertension, irritable bowel syndrome, tremor. Recently hospitalized 4/2-4/10/23 due to peritonitis. ? ?Patient with 3-4 + pitting edema. She has worsening shortness of breath at rest and with exertion, increased fatigue, weakness. Endorses generalized pain and abdominal pain, worsening nausea. Decline in appetite; eating 25-50% of 2 meals a day now. Increased swallowing difficulty, chokes easily medications. Currently receiving physical therapy and nurse with [facility]. Has had peritonitis 3 times in the past year. Has a DNR. Worsening nausea, eating 25-50% of 2 meals. Moving bowels okay; makes small amount of urine. Patient and husband married 30 years, 6 children. Their daughter passed away recently due to cancer. A 10-point ROS is negative, except for the pertinent positives and negatives detailed per the HPI. ? ?Patient appears short of breath at rest and with exertion. Sats were 92-93% at rest; oxygen sats dropped to 87% after ambulating 5 feet and she had to take a rest break. It took approximately 5 minutes for her sats to rebound. ? ? ?History obtained from review of EMR, discussion with primary team, and interview with family, facility staff/caregiver and/or Andrea Stewart.  ?I reviewed available labs, medications, imaging, studies and related documents from the EMR.  Records reviewed and summarized above.  ? ?Physical Exam: ?Constitutional: NAD ?General: frail appearing, ill appearing ?EYES: anicteric sclera, lids intact, no discharge  ?ENMT: intact hearing, oral mucous membranes moist, dentition intact ?CV: S1S2, RRR, 3-4+ pitting LE edema ?Pulmonary: crackles to bases, increased work of breathing, + cough, room air ?Abdomen: normo-active BS + 4 quadrants, soft and non tender ?GU: deferred ?MSK: moves all extremities, ambulatory with walker ?Skin: warm and dry, no rashes or wounds on visible skin ?Neuro:  +generalized weakness,  no cognitive impairment ?Psych: non-anxious affect, A and O x  3 ?Hem/lymph/immuno: no widespread bruising ?CURRENT PROBLEM LIST:  ?Patient Active Problem List  ? Diagnosis Date Noted  ? Hypokalemia 02/21/2022  ? Hypomagnesemia 02/21/2022  ? Hypoglycemia 02/20/2022  ? Nausea & vomiting 02/19/2022  ? Acute respiratory failure with hypoxia ([place]) 02/15/2022  ? End stage renal disease ([place]) 02/14/2022  ? Essential hypertension 02/14/2022  ? Hyperglycemia due to type 2 diabetes mellitus ([place]) 02/14/2022  ? Anemia 02/14/2022  ? SOB (shortness of breath) 02/14/2022  ? Abdominal pain 02/14/2022  ? Atrial fibrillation with rapid ventricular response ([place]) 02/09/2022  ? ESRD on peritoneal dialysis ([place]) 02/09/2022  ? Swelling   ? Acute pain of right shoulder   ? Sleep disturbance   ? Labile blood glucose   ? Anemia of chronic disease   ? Intertrochanteric fracture of right hip ([place]) 09/30/2021  ? Closed right hip fracture ([place]) 09/21/2021  ? Fall   ? Humeral head fracture, right, closed, initial encounter   ? Abdominal pain 02/24/2021  ? Hip fracture ([place]) 12/31/2020  ? Insomnia 09/13/2019  ? Pruritus 09/13/2019  ? PAD (peripheral artery disease) ([place]) 05/30/2018  ? Family history of colon cancer requiring screening colonoscopy 04/06/2018  ? Incarcerated ventral hernia 09/07/2017  ? Ventral hernia 09/07/2017  ? Atherosclerosis of native arteries of extremity with intermittent claudication ([place]) 06/21/2017  ? Morbid obesity ([place]) 10/21/2016  ? Edema 06/19/2016  ? Tobacco abuse counseling 12/17/2015  ? Tobacco use disorder 12/11/2015  ? Other specified postprocedural states 08/07/2015  ? CKD (chronic kidney disease),  stage III ([place]) 05/13/2015  ? COPD (chronic obstructive pulmonary disease) with chronic bronchitis ([place]) 01/21/2015  ? Abnormal findings on diagnostic imaging of other specified body structures 01/21/2015  ? Thyroid mass 12/13/2014  ? Hx of left heart catheterization by cutdown   ? Single kidney 09/30/2014  ? Encounter for general adult medical examination without abnormal  findings 03/03/2014  ? Personal history of diseases of the blood and blood-forming organs and certain disorders involving the immune mechanism 03/03/2014  ? Pain of right leg 01/28/2014  ? Irritable bowel syndrome 09/09/2012

## 2022-03-04 ENCOUNTER — Telehealth: Payer: Self-pay

## 2022-03-04 NOTE — Telephone Encounter (Signed)
302 pm.  Message received from Dr. Ammie Ferrier office from [facility].  PCP agrees with hospice referral as patient has end-stage renal disease and he will be attending physician.  Update provided to [facility], NP. ?

## 2022-03-04 NOTE — Telephone Encounter (Signed)
1014 am.  Request from [facility], NP to obtain hospice referral order and ensure PCP will serve as AOR.  Phone call made to PCP office with request.  ?

## 2022-03-15 DEATH — deceased

## 2022-03-22 ENCOUNTER — Other Ambulatory Visit: Payer: PPO | Admitting: Student
